# Patient Record
Sex: Male | Born: 1955 | Race: Black or African American | Hispanic: No | Marital: Single | Smoking: Never smoker
Health system: Southern US, Community
[De-identification: ages and names within clinical notes are randomized; demographics above are authoritative.]

## PROBLEM LIST (undated history)

## (undated) DIAGNOSIS — F329 Major depressive disorder, single episode, unspecified: Secondary | ICD-10-CM

## (undated) DIAGNOSIS — I4891 Unspecified atrial fibrillation: Secondary | ICD-10-CM

## (undated) DIAGNOSIS — G620 Drug-induced polyneuropathy: Secondary | ICD-10-CM

## (undated) DIAGNOSIS — D649 Anemia, unspecified: Secondary | ICD-10-CM

## (undated) DIAGNOSIS — Z8673 Personal history of transient ischemic attack (TIA), and cerebral infarction without residual deficits: Secondary | ICD-10-CM

## (undated) DIAGNOSIS — F32A Depression, unspecified: Secondary | ICD-10-CM

## (undated) DIAGNOSIS — I429 Cardiomyopathy, unspecified: Secondary | ICD-10-CM

## (undated) DIAGNOSIS — E7800 Pure hypercholesterolemia, unspecified: Secondary | ICD-10-CM

## (undated) DIAGNOSIS — I071 Rheumatic tricuspid insufficiency: Secondary | ICD-10-CM

## (undated) DIAGNOSIS — M1990 Unspecified osteoarthritis, unspecified site: Secondary | ICD-10-CM

## (undated) DIAGNOSIS — G4730 Sleep apnea, unspecified: Secondary | ICD-10-CM

## (undated) DIAGNOSIS — C801 Malignant (primary) neoplasm, unspecified: Secondary | ICD-10-CM

## (undated) DIAGNOSIS — I499 Cardiac arrhythmia, unspecified: Secondary | ICD-10-CM

## (undated) DIAGNOSIS — J189 Pneumonia, unspecified organism: Secondary | ICD-10-CM

## (undated) DIAGNOSIS — I509 Heart failure, unspecified: Secondary | ICD-10-CM

## (undated) DIAGNOSIS — C9000 Multiple myeloma not having achieved remission: Secondary | ICD-10-CM

## (undated) DIAGNOSIS — I10 Essential (primary) hypertension: Secondary | ICD-10-CM

## (undated) DIAGNOSIS — T451X5A Adverse effect of antineoplastic and immunosuppressive drugs, initial encounter: Secondary | ICD-10-CM

## (undated) DIAGNOSIS — E785 Hyperlipidemia, unspecified: Secondary | ICD-10-CM

## (undated) HISTORY — PX: CARDIAC SURGERY: SHX584

## (undated) HISTORY — DX: Personal history of transient ischemic attack (TIA), and cerebral infarction without residual deficits: Z86.73

## (undated) HISTORY — PX: ATRIAL ABLATION SURGERY: SHX560

## (undated) MED FILL — Dexamethasone Sodium Phosphate Inj 100 MG/10ML: INTRAMUSCULAR | Qty: 1 | Status: AC

## (undated) NOTE — *Deleted (*Deleted)
TREATMENT   Neuromuscular Re-education  Tandem Stance in Corner x 30s with each foot forward; Heel Toe Raises with no UE support 3s hold x 10 each Standing Single Leg Stance with no UE support x 30s each; Reviewed HEP and pt provided handout with instructions about how to perform safely;

---
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
## 2012-01-05 ENCOUNTER — Ambulatory Visit: Payer: Self-pay | Admitting: Internal Medicine

## 2012-01-05 LAB — COMPREHENSIVE METABOLIC PANEL
Anion Gap: 5 — ABNORMAL LOW (ref 7–16)
BUN: 18 mg/dL (ref 7–18)
Bilirubin,Total: 1.1 mg/dL — ABNORMAL HIGH (ref 0.2–1.0)
Chloride: 102 mmol/L (ref 98–107)
Co2: 33 mmol/L — ABNORMAL HIGH (ref 21–32)
Creatinine: 1.81 mg/dL — ABNORMAL HIGH (ref 0.60–1.30)
EGFR (African American): 50 — ABNORMAL LOW
EGFR (Non-African Amer.): 42 — ABNORMAL LOW
Glucose: 85 mg/dL (ref 65–99)
Potassium: 3.2 mmol/L — ABNORMAL LOW (ref 3.5–5.1)
SGOT(AST): 57 U/L — ABNORMAL HIGH (ref 15–37)
Total Protein: 9.6 g/dL — ABNORMAL HIGH (ref 6.4–8.2)

## 2012-01-05 LAB — CBC WITH DIFFERENTIAL/PLATELET
Basophil #: 0 10*3/uL (ref 0.0–0.1)
Basophil %: 0.7 %
Lymphocyte %: 16.2 %
MCHC: 32.2 g/dL (ref 32.0–36.0)
Monocyte %: 14.8 %
Neutrophil #: 2.3 10*3/uL (ref 1.4–6.5)
Neutrophil %: 64.1 %
RDW: 14 % (ref 11.5–14.5)

## 2012-01-05 LAB — MAGNESIUM: Magnesium: 1.8 mg/dL

## 2012-01-05 LAB — T4, FREE: Free Thyroxine: 1.23 ng/dL (ref 0.76–1.46)

## 2012-01-05 LAB — PROTIME-INR
INR: 1.4
Prothrombin Time: 17.8 secs — ABNORMAL HIGH (ref 11.5–14.7)

## 2012-02-07 ENCOUNTER — Ambulatory Visit: Payer: Self-pay | Admitting: Oncology

## 2012-02-13 ENCOUNTER — Ambulatory Visit: Payer: Self-pay | Admitting: Oncology

## 2012-02-13 LAB — CBC CANCER CENTER
Eosinophil #: 0.1 x10 3/mm (ref 0.0–0.7)
HGB: 10.7 g/dL — ABNORMAL LOW (ref 13.0–18.0)
Lymphocyte #: 0.7 x10 3/mm — ABNORMAL LOW (ref 1.0–3.6)
MCH: 29.1 pg (ref 26.0–34.0)
MCHC: 32.6 g/dL (ref 32.0–36.0)
Monocyte %: 15.1 %
Neutrophil #: 2.1 x10 3/mm (ref 1.4–6.5)
Platelet: 146 x10 3/mm — ABNORMAL LOW (ref 150–440)
WBC: 3.5 x10 3/mm — ABNORMAL LOW (ref 3.8–10.6)

## 2012-02-13 LAB — BASIC METABOLIC PANEL
Anion Gap: 5 — ABNORMAL LOW (ref 7–16)
BUN: 19 mg/dL — ABNORMAL HIGH (ref 7–18)
Chloride: 102 mmol/L (ref 98–107)
EGFR (African American): 57 — ABNORMAL LOW
EGFR (Non-African Amer.): 47 — ABNORMAL LOW
Glucose: 108 mg/dL — ABNORMAL HIGH (ref 65–99)
Osmolality: 278 (ref 275–301)
Potassium: 3.2 mmol/L — ABNORMAL LOW (ref 3.5–5.1)

## 2012-02-14 LAB — URINE IEP, RANDOM

## 2012-02-15 LAB — KAPPA/LAMBDA FREE LIGHT CHAINS (ARMC)

## 2012-02-15 LAB — PROT IMMUNOELECTROPHORES(ARMC)

## 2012-03-05 ENCOUNTER — Ambulatory Visit: Payer: Self-pay | Admitting: Oncology

## 2012-03-05 LAB — CBC CANCER CENTER
Bands: 2 %
Basophil #: 0 x10 3/mm (ref 0.0–0.1)
Eosinophil #: 0 x10 3/mm (ref 0.0–0.7)
Eosinophil %: 0.1 %
HCT: 35.8 % — ABNORMAL LOW (ref 40.0–52.0)
Lymphocyte #: 0.6 x10 3/mm — ABNORMAL LOW (ref 1.0–3.6)
MCH: 28.2 pg (ref 26.0–34.0)
MCHC: 32.2 g/dL (ref 32.0–36.0)
MCV: 88 fL (ref 80–100)
Monocyte #: 0.6 x10 3/mm (ref 0.2–1.0)
Monocyte %: 8.9 %
Neutrophil %: 81.1 %
Platelet: 101 x10 3/mm — ABNORMAL LOW (ref 150–440)
RDW: 14.1 % (ref 11.5–14.5)
WBC: 6.2 x10 3/mm (ref 3.8–10.6)

## 2012-03-05 LAB — BASIC METABOLIC PANEL
BUN: 26 mg/dL — ABNORMAL HIGH (ref 7–18)
Chloride: 94 mmol/L — ABNORMAL LOW (ref 98–107)
EGFR (African American): 54 — ABNORMAL LOW
EGFR (Non-African Amer.): 46 — ABNORMAL LOW
Osmolality: 289 (ref 275–301)
Potassium: 3.6 mmol/L (ref 3.5–5.1)
Sodium: 129 mmol/L — ABNORMAL LOW (ref 136–145)

## 2012-03-13 LAB — GLUCOSE, RANDOM: Glucose: 334 mg/dL — ABNORMAL HIGH (ref 65–99)

## 2012-03-20 LAB — GLUCOSE, RANDOM: Glucose: 547 mg/dL (ref 65–99)

## 2012-03-26 LAB — CBC CANCER CENTER
Basophil %: 0.5 %
Eosinophil #: 0.2 x10 3/mm (ref 0.0–0.7)
Eosinophil %: 3 %
HCT: 34.8 % — ABNORMAL LOW (ref 40.0–52.0)
Lymphocyte #: 1 x10 3/mm (ref 1.0–3.6)
MCHC: 32.7 g/dL (ref 32.0–36.0)
MCV: 86 fL (ref 80–100)
Monocyte #: 0.8 x10 3/mm (ref 0.2–1.0)
Monocyte %: 15.5 %
Neutrophil #: 3.1 x10 3/mm (ref 1.4–6.5)
Platelet: 90 x10 3/mm — ABNORMAL LOW (ref 150–440)
RBC: 4.04 10*6/uL — ABNORMAL LOW (ref 4.40–5.90)
RDW: 15.1 % — ABNORMAL HIGH (ref 11.5–14.5)
WBC: 5.1 x10 3/mm (ref 3.8–10.6)

## 2012-03-26 LAB — COMPREHENSIVE METABOLIC PANEL
Alkaline Phosphatase: 252 U/L — ABNORMAL HIGH (ref 50–136)
Bilirubin,Total: 0.7 mg/dL (ref 0.2–1.0)
Chloride: 98 mmol/L (ref 98–107)
Co2: 29 mmol/L (ref 21–32)
Creatinine: 1.52 mg/dL — ABNORMAL HIGH (ref 0.60–1.30)
EGFR (Non-African Amer.): 50 — ABNORMAL LOW
Glucose: 342 mg/dL — ABNORMAL HIGH (ref 65–99)
Potassium: 3.4 mmol/L — ABNORMAL LOW (ref 3.5–5.1)
SGPT (ALT): 71 U/L

## 2012-03-27 LAB — URINE IEP, RANDOM

## 2012-04-05 ENCOUNTER — Ambulatory Visit: Payer: Self-pay | Admitting: Oncology

## 2012-04-16 LAB — COMPREHENSIVE METABOLIC PANEL
Albumin: 3.7 g/dL (ref 3.4–5.0)
Co2: 35 mmol/L — ABNORMAL HIGH (ref 21–32)
EGFR (African American): 54 — ABNORMAL LOW
EGFR (Non-African Amer.): 46 — ABNORMAL LOW
Glucose: 184 mg/dL — ABNORMAL HIGH (ref 65–99)
Osmolality: 292 (ref 275–301)
Potassium: 3 mmol/L — ABNORMAL LOW (ref 3.5–5.1)

## 2012-04-16 LAB — CBC CANCER CENTER
Basophil #: 0 x10 3/mm (ref 0.0–0.1)
Basophil %: 0.6 %
HGB: 12.8 g/dL — ABNORMAL LOW (ref 13.0–18.0)
Lymphocyte #: 1.2 x10 3/mm (ref 1.0–3.6)
Monocyte #: 0.8 x10 3/mm (ref 0.2–1.0)
Monocyte %: 11.6 %
Neutrophil #: 4.9 x10 3/mm (ref 1.4–6.5)
Neutrophil %: 69.9 %
RDW: 15 % — ABNORMAL HIGH (ref 11.5–14.5)
WBC: 7.1 x10 3/mm (ref 3.8–10.6)

## 2012-04-18 LAB — URINE IEP, RANDOM

## 2012-05-05 ENCOUNTER — Ambulatory Visit: Payer: Self-pay | Admitting: Oncology

## 2012-05-07 LAB — BASIC METABOLIC PANEL
Anion Gap: 9 (ref 7–16)
BUN: 20 mg/dL — ABNORMAL HIGH (ref 7–18)
Chloride: 100 mmol/L (ref 98–107)
Co2: 30 mmol/L (ref 21–32)
Creatinine: 1.5 mg/dL — ABNORMAL HIGH (ref 0.60–1.30)
EGFR (African American): 59 — ABNORMAL LOW
EGFR (Non-African Amer.): 51 — ABNORMAL LOW
Glucose: 145 mg/dL — ABNORMAL HIGH (ref 65–99)
Osmolality: 283 (ref 275–301)

## 2012-05-07 LAB — CBC CANCER CENTER
Basophil #: 0 x10 3/mm (ref 0.0–0.1)
Eosinophil #: 0.1 x10 3/mm (ref 0.0–0.7)
HCT: 38.6 % — ABNORMAL LOW (ref 40.0–52.0)
Lymphocyte %: 23.6 %
MCHC: 33.5 g/dL (ref 32.0–36.0)
Monocyte #: 0.7 x10 3/mm (ref 0.2–1.0)
Monocyte %: 13.4 %
Neutrophil #: 3 x10 3/mm (ref 1.4–6.5)
Neutrophil %: 60.7 %
Platelet: 173 x10 3/mm (ref 150–440)
RDW: 15.1 % — ABNORMAL HIGH (ref 11.5–14.5)
WBC: 4.9 x10 3/mm (ref 3.8–10.6)

## 2012-05-09 LAB — PROT IMMUNOELECTROPHORES(ARMC)

## 2012-05-09 LAB — KAPPA/LAMBDA FREE LIGHT CHAINS (ARMC)

## 2012-05-12 LAB — URINE IEP, RANDOM

## 2012-05-28 LAB — CBC CANCER CENTER
Basophil #: 0 x10 3/mm (ref 0.0–0.1)
Basophil %: 0.3 %
Eosinophil #: 0.1 x10 3/mm (ref 0.0–0.7)
HCT: 38.1 % — ABNORMAL LOW (ref 40.0–52.0)
HGB: 12.6 g/dL — ABNORMAL LOW (ref 13.0–18.0)
Lymphocyte #: 0.9 x10 3/mm — ABNORMAL LOW (ref 1.0–3.6)
Lymphocyte %: 17.7 %
MCH: 28.7 pg (ref 26.0–34.0)
MCHC: 33.1 g/dL (ref 32.0–36.0)
MCV: 87 fL (ref 80–100)
Monocyte #: 0.6 x10 3/mm (ref 0.2–1.0)
Neutrophil #: 3.5 x10 3/mm (ref 1.4–6.5)
Platelet: 130 x10 3/mm — ABNORMAL LOW (ref 150–440)
RDW: 14.9 % — ABNORMAL HIGH (ref 11.5–14.5)

## 2012-05-28 LAB — BASIC METABOLIC PANEL
BUN: 19 mg/dL — ABNORMAL HIGH (ref 7–18)
Chloride: 102 mmol/L (ref 98–107)
Co2: 30 mmol/L (ref 21–32)
Creatinine: 1.51 mg/dL — ABNORMAL HIGH (ref 0.60–1.30)
EGFR (Non-African Amer.): 51 — ABNORMAL LOW
Glucose: 107 mg/dL — ABNORMAL HIGH (ref 65–99)
Osmolality: 278 (ref 275–301)
Potassium: 3.1 mmol/L — ABNORMAL LOW (ref 3.5–5.1)
Sodium: 138 mmol/L (ref 136–145)

## 2012-05-30 LAB — PROT IMMUNOELECTROPHORES(ARMC)

## 2012-06-05 ENCOUNTER — Ambulatory Visit: Payer: Self-pay | Admitting: Oncology

## 2012-06-18 LAB — CBC CANCER CENTER
Basophil #: 0 x10 3/mm (ref 0.0–0.1)
Eosinophil #: 0.1 x10 3/mm (ref 0.0–0.7)
Eosinophil %: 1.2 %
HCT: 37.7 % — ABNORMAL LOW (ref 40.0–52.0)
Lymphocyte #: 0.8 x10 3/mm — ABNORMAL LOW (ref 1.0–3.6)
Lymphocyte %: 13.3 %
MCV: 87 fL (ref 80–100)
Monocyte %: 10.8 %
Platelet: 123 x10 3/mm — ABNORMAL LOW (ref 150–440)
WBC: 6 x10 3/mm (ref 3.8–10.6)

## 2012-06-18 LAB — BASIC METABOLIC PANEL
Anion Gap: 6 — ABNORMAL LOW (ref 7–16)
BUN: 16 mg/dL (ref 7–18)
Calcium, Total: 8.5 mg/dL (ref 8.5–10.1)
Creatinine: 1.53 mg/dL — ABNORMAL HIGH (ref 0.60–1.30)
EGFR (African American): 58 — ABNORMAL LOW
EGFR (Non-African Amer.): 50 — ABNORMAL LOW
Glucose: 122 mg/dL — ABNORMAL HIGH (ref 65–99)
Sodium: 138 mmol/L (ref 136–145)

## 2012-06-19 LAB — PROT IMMUNOELECT,UR-24HR

## 2012-06-20 LAB — PROT IMMUNOELECTROPHORES(ARMC)

## 2012-07-06 ENCOUNTER — Ambulatory Visit: Payer: Self-pay | Admitting: Oncology

## 2012-07-09 LAB — CBC CANCER CENTER
Basophil #: 0 x10 3/mm (ref 0.0–0.1)
Basophil %: 0.2 %
Eosinophil #: 0.1 x10 3/mm (ref 0.0–0.7)
Eosinophil %: 1.7 %
HCT: 40.8 % (ref 40.0–52.0)
HGB: 13.7 g/dL (ref 13.0–18.0)
Lymphocyte #: 0.9 x10 3/mm — ABNORMAL LOW (ref 1.0–3.6)
MCH: 28.9 pg (ref 26.0–34.0)
MCHC: 33.6 g/dL (ref 32.0–36.0)
MCV: 86 fL (ref 80–100)
Monocyte #: 0.8 x10 3/mm (ref 0.2–1.0)
Neutrophil #: 3.5 x10 3/mm (ref 1.4–6.5)
Neutrophil %: 66.3 %
RBC: 4.74 10*6/uL (ref 4.40–5.90)
RDW: 13.7 % (ref 11.5–14.5)

## 2012-07-09 LAB — COMPREHENSIVE METABOLIC PANEL
Anion Gap: 6 — ABNORMAL LOW (ref 7–16)
BUN: 17 mg/dL (ref 7–18)
Bilirubin,Total: 0.6 mg/dL (ref 0.2–1.0)
Chloride: 101 mmol/L (ref 98–107)
Creatinine: 1.53 mg/dL — ABNORMAL HIGH (ref 0.60–1.30)
EGFR (African American): 58 — ABNORMAL LOW
EGFR (Non-African Amer.): 50 — ABNORMAL LOW
Potassium: 3.3 mmol/L — ABNORMAL LOW (ref 3.5–5.1)
SGOT(AST): 83 U/L — ABNORMAL HIGH (ref 15–37)
Total Protein: 7.5 g/dL (ref 6.4–8.2)

## 2012-07-24 ENCOUNTER — Emergency Department: Payer: Self-pay | Admitting: Emergency Medicine

## 2012-07-24 LAB — CBC
HCT: 41.1 % (ref 40.0–52.0)
HGB: 14.1 g/dL (ref 13.0–18.0)
MCHC: 34.4 g/dL (ref 32.0–36.0)
RBC: 4.86 10*6/uL (ref 4.40–5.90)
RDW: 13.9 % (ref 11.5–14.5)

## 2012-07-24 LAB — URINALYSIS, COMPLETE
Bacteria: NONE SEEN
Blood: NEGATIVE
Nitrite: NEGATIVE
Protein: NEGATIVE
Specific Gravity: 1.014 (ref 1.003–1.030)
Squamous Epithelial: <1

## 2012-07-24 LAB — COMPREHENSIVE METABOLIC PANEL
Albumin: 4 g/dL (ref 3.4–5.0)
Alkaline Phosphatase: 129 U/L (ref 50–136)
BUN: 32 mg/dL — ABNORMAL HIGH (ref 7–18)
Bilirubin,Total: 0.6 mg/dL (ref 0.2–1.0)
Chloride: 102 mmol/L (ref 98–107)
Creatinine: 1.79 mg/dL — ABNORMAL HIGH (ref 0.60–1.30)
EGFR (African American): 48 — ABNORMAL LOW
Glucose: 90 mg/dL (ref 65–99)
SGOT(AST): 32 U/L (ref 15–37)
SGPT (ALT): 37 U/L (ref 12–78)
Total Protein: 8.4 g/dL — ABNORMAL HIGH (ref 6.4–8.2)

## 2012-07-24 LAB — DIGOXIN LEVEL: Digoxin: 0.97 ng/mL

## 2012-07-24 LAB — PROTIME-INR: Prothrombin Time: 24.6 secs — ABNORMAL HIGH (ref 11.5–14.7)

## 2012-07-25 DIAGNOSIS — N4830 Priapism, unspecified: Secondary | ICD-10-CM | POA: Insufficient documentation

## 2012-07-30 LAB — CBC CANCER CENTER
Basophil #: 0 x10 3/mm (ref 0.0–0.1)
Eosinophil #: 0.1 x10 3/mm (ref 0.0–0.7)
HCT: 31.6 % — ABNORMAL LOW (ref 40.0–52.0)
Lymphocyte %: 12.2 %
MCH: 28.9 pg (ref 26.0–34.0)
MCHC: 33.5 g/dL (ref 32.0–36.0)
MCV: 86 fL (ref 80–100)
Monocyte #: 0.8 x10 3/mm (ref 0.2–1.0)
Monocyte %: 11.6 %
Neutrophil %: 74 %
Platelet: 164 x10 3/mm (ref 150–440)
RBC: 3.66 10*6/uL — ABNORMAL LOW (ref 4.40–5.90)
RDW: 13.7 % (ref 11.5–14.5)
WBC: 6.6 x10 3/mm (ref 3.8–10.6)

## 2012-07-30 LAB — BASIC METABOLIC PANEL
Anion Gap: 8 (ref 7–16)
Calcium, Total: 9.2 mg/dL (ref 8.5–10.1)
Chloride: 102 mmol/L (ref 98–107)
Co2: 32 mmol/L (ref 21–32)
Creatinine: 1.24 mg/dL (ref 0.60–1.30)
EGFR (African American): >60
Osmolality: 287 (ref 275–301)

## 2012-07-31 LAB — URINE IEP, RANDOM

## 2012-07-31 LAB — PROT IMMUNOELECTROPHORES(ARMC)

## 2012-07-31 LAB — KAPPA/LAMBDA FREE LIGHT CHAINS (ARMC)

## 2012-08-05 ENCOUNTER — Ambulatory Visit: Payer: Self-pay | Admitting: Oncology

## 2012-09-10 ENCOUNTER — Ambulatory Visit: Payer: Self-pay | Admitting: Oncology

## 2012-09-10 LAB — CBC CANCER CENTER
Basophil #: 0 x10 3/mm (ref 0.0–0.1)
Eosinophil #: 0.2 x10 3/mm (ref 0.0–0.7)
Eosinophil %: 4.3 %
HGB: 13.3 g/dL (ref 13.0–18.0)
Lymphocyte #: 0.8 x10 3/mm — ABNORMAL LOW (ref 1.0–3.6)
Lymphocyte %: 21.5 %
MCHC: 34.5 g/dL (ref 32.0–36.0)
MCV: 85 fL (ref 80–100)
Monocyte %: 10.7 %
Platelet: 212 x10 3/mm (ref 150–440)
RBC: 4.53 10*6/uL (ref 4.40–5.90)
RDW: 13.4 % (ref 11.5–14.5)
WBC: 3.9 x10 3/mm (ref 3.8–10.6)

## 2012-09-10 LAB — BASIC METABOLIC PANEL
BUN: 14 mg/dL (ref 7–18)
Co2: 31 mmol/L (ref 21–32)
EGFR (African American): 57 — ABNORMAL LOW
EGFR (Non-African Amer.): 49 — ABNORMAL LOW
Glucose: 111 mg/dL — ABNORMAL HIGH (ref 65–99)
Sodium: 139 mmol/L (ref 136–145)

## 2012-09-12 LAB — PROT IMMUNOELECTROPHORES(ARMC)

## 2012-10-05 ENCOUNTER — Ambulatory Visit: Payer: Self-pay | Admitting: Oncology

## 2012-12-17 ENCOUNTER — Ambulatory Visit: Payer: Self-pay | Admitting: Oncology

## 2012-12-17 LAB — CBC CANCER CENTER
Basophil #: 0 x10 3/mm (ref 0.0–0.1)
Basophil %: 0.9 %
HCT: 40.4 % (ref 40.0–52.0)
Lymphocyte #: 0.9 x10 3/mm — ABNORMAL LOW (ref 1.0–3.6)
Lymphocyte %: 24.7 %
MCHC: 33.4 g/dL (ref 32.0–36.0)
Monocyte #: 0.5 x10 3/mm (ref 0.2–1.0)
Neutrophil %: 58 %
RBC: 4.96 10*6/uL (ref 4.40–5.90)
WBC: 3.6 x10 3/mm — ABNORMAL LOW (ref 3.8–10.6)

## 2012-12-17 LAB — BASIC METABOLIC PANEL
Anion Gap: 10 (ref 7–16)
BUN: 14 mg/dL (ref 7–18)
Calcium, Total: 8.8 mg/dL (ref 8.5–10.1)
Chloride: 102 mmol/L (ref 98–107)
Creatinine: 1.69 mg/dL — ABNORMAL HIGH (ref 0.60–1.30)
Sodium: 141 mmol/L (ref 136–145)

## 2012-12-19 LAB — PROT IMMUNOELECTROPHORES(ARMC)

## 2013-01-03 ENCOUNTER — Ambulatory Visit: Payer: Self-pay | Admitting: Oncology

## 2013-05-13 ENCOUNTER — Ambulatory Visit: Payer: Self-pay | Admitting: Oncology

## 2013-05-13 LAB — CBC CANCER CENTER
Basophil %: 1.1 %
Eosinophil #: 0.1 x10 3/mm (ref 0.0–0.7)
Eosinophil %: 3.1 %
Lymphocyte #: 0.9 x10 3/mm — ABNORMAL LOW (ref 1.0–3.6)
Lymphocyte %: 24.7 %
MCH: 28 pg (ref 26.0–34.0)
MCV: 84 fL (ref 80–100)
Monocyte #: 0.4 x10 3/mm (ref 0.2–1.0)
Monocyte %: 10.3 %
Neutrophil %: 60.8 %
RBC: 5.13 10*6/uL (ref 4.40–5.90)

## 2013-05-13 LAB — BASIC METABOLIC PANEL
Anion Gap: 9 (ref 7–16)
Calcium, Total: 8.6 mg/dL (ref 8.5–10.1)
Chloride: 102 mmol/L (ref 98–107)
EGFR (African American): 58 — ABNORMAL LOW
EGFR (Non-African Amer.): 50 — ABNORMAL LOW
Glucose: 122 mg/dL — ABNORMAL HIGH (ref 65–99)
Sodium: 143 mmol/L (ref 136–145)

## 2013-05-14 LAB — PROT IMMUNOELECTROPHORES(ARMC)

## 2013-05-18 LAB — URINE IEP, RANDOM

## 2013-06-05 ENCOUNTER — Ambulatory Visit: Payer: Self-pay | Admitting: Oncology

## 2014-04-26 DIAGNOSIS — I10 Essential (primary) hypertension: Secondary | ICD-10-CM | POA: Insufficient documentation

## 2014-04-29 DIAGNOSIS — Z7901 Long term (current) use of anticoagulants: Secondary | ICD-10-CM | POA: Insufficient documentation

## 2014-04-29 DIAGNOSIS — E785 Hyperlipidemia, unspecified: Secondary | ICD-10-CM | POA: Insufficient documentation

## 2015-02-09 DIAGNOSIS — E7800 Pure hypercholesterolemia, unspecified: Secondary | ICD-10-CM | POA: Insufficient documentation

## 2015-02-09 DIAGNOSIS — E119 Type 2 diabetes mellitus without complications: Secondary | ICD-10-CM | POA: Insufficient documentation

## 2015-12-06 DIAGNOSIS — I482 Chronic atrial fibrillation, unspecified: Secondary | ICD-10-CM | POA: Insufficient documentation

## 2017-01-01 DIAGNOSIS — I071 Rheumatic tricuspid insufficiency: Secondary | ICD-10-CM | POA: Insufficient documentation

## 2017-01-01 DIAGNOSIS — I517 Cardiomegaly: Secondary | ICD-10-CM | POA: Insufficient documentation

## 2017-01-01 DIAGNOSIS — G4733 Obstructive sleep apnea (adult) (pediatric): Secondary | ICD-10-CM | POA: Insufficient documentation

## 2018-01-07 DIAGNOSIS — I428 Other cardiomyopathies: Secondary | ICD-10-CM | POA: Insufficient documentation

## 2018-01-07 DIAGNOSIS — I429 Cardiomyopathy, unspecified: Secondary | ICD-10-CM | POA: Insufficient documentation

## 2018-01-27 ENCOUNTER — Encounter: Payer: Self-pay | Admitting: *Deleted

## 2018-01-27 ENCOUNTER — Other Ambulatory Visit: Payer: Self-pay

## 2018-01-27 ENCOUNTER — Ambulatory Visit (INDEPENDENT_AMBULATORY_CARE_PROVIDER_SITE_OTHER): Payer: BLUE CROSS/BLUE SHIELD

## 2018-01-27 ENCOUNTER — Ambulatory Visit
Admission: EM | Admit: 2018-01-27 | Discharge: 2018-01-27 | Disposition: A | Discharge status: Home / Self Care | Payer: BLUE CROSS/BLUE SHIELD | Attending: Family Medicine | Admitting: Family Medicine

## 2018-01-27 DIAGNOSIS — M25572 Pain in left ankle and joints of left foot: Secondary | ICD-10-CM | POA: Diagnosis not present

## 2018-01-27 DIAGNOSIS — M25472 Effusion, left ankle: Secondary | ICD-10-CM

## 2018-01-27 HISTORY — DX: Unspecified atrial fibrillation: I48.91

## 2018-01-27 HISTORY — DX: Essential (primary) hypertension: I10

## 2018-01-27 HISTORY — DX: Malignant (primary) neoplasm, unspecified: C80.1

## 2018-01-27 MED ORDER — TRAMADOL HCL 50 MG PO TABS: 50.0000 mg | ORAL_TABLET | Freq: Three times a day (TID) | ORAL | 0 refills | Status: DC | PRN

## 2018-01-27 NOTE — Discharge Instructions (Signed)
Rest, ice, elevation.  Be sure to take your lasix.  Pain medication as needed.  Take care  Dr. Lacinda Axon

## 2018-01-27 NOTE — ED Provider Notes (Signed)
[facility]    CSN: [id number] Arrival date & time: 01/27/18  1307  History   Chief Complaint Chief Complaint  Patient presents with  . Ankle Pain   HPI   62 year old male presents with left ankle pain.  Patient states that he played tennis on Tuesday.  He has not played in a while.  He does not recall an injury.  He states that on Wednesday he was fine.  He had no pain.  On Thursday he had some pain but it was quite minimal.  On Friday he developed worsening pain and associated swelling around his left ankle.  He states that this continues to be present.  He has difficulty ambulating.  Worse with range of motion.  He reports decreased range of motion.  No relieving factors.  No other associated symptoms.  No other complaints at this time.  Past Medical History:  Diagnosis Date  . A-fib ([place])   . Cancer ([place])   . Hypertension    Surgical Hx - Ablation.  Home Medications    Prior to Admission medications   Medication Sig Start Date End Date Taking? Authorizing Provider  albuterol (PROVENTIL HFA) 108 (90 Base) MCG/ACT inhaler INHALE 2 INHALATIONS INTO THE LUNGS EVERY 6 HOURS AS NEEDED 12/31/17  Yes [provider]  atorvastatin (LIPITOR) 10 MG tablet Take by mouth. 05/23/17 05/23/18 Yes [provider]  digoxin ([place]) 0.25 MG tablet TAKE 1 TABLET BY MOUTH EVERY DAY 05/06/17  Yes [provider]  furosemide (LASIX) 80 MG tablet TAKE 1 TABLET(80 MG) BY MOUTH TWICE DAILY 10/11/17  Yes [provider]  pioglitazone (ACTOS) 15 MG tablet TAKE 1 TABLET(15 MG) BY MOUTH EVERY DAY 05/10/17  Yes [provider]  potassium bicarbonate (KLOR-CON/EF) 25 MEQ disintegrating tablet DISSOLVE 1 TABLET IN LIQUID AND DRINK BY MOUTH ONCE DAILY 11/29/16  Yes [provider]  warfarin (COUMADIN) 5 MG tablet Take by mouth. 11/22/17  Yes [provider]  traMADol (ULTRAM) 50 MG tablet Take 1 tablet (50 mg total) by mouth every 8 (eight) hours  as needed. 01/27/18   Coral Spikes, DO    Family History Diabetes     High blood pressure (Hypertension)     Parkinsonism     Prostate cancer      Social History Social History   Tobacco Use  . Smoking status: Never Smoker  . Smokeless tobacco: Never Used  Substance Use Topics  . Alcohol use: Not Currently  . Drug use: Never   Allergies   Patient has no known allergies.   Review of Systems Review of Systems  Constitutional: Negative.   Musculoskeletal:       Left ankle pain and swelling.   Physical Exam Triage Vital Signs ED Triage Vitals  Enc Vitals Group     BP 01/27/18 1331 (!) 130/52     Pulse Rate 01/27/18 1331 73     Resp 01/27/18 1331 16     Temp 01/27/18 1331 98.6 F (37 C)     Temp Source 01/27/18 1331 Oral     SpO2 01/27/18 1331 98 %     Weight 01/27/18 1333 235 lb (106.6 kg)     Height 01/27/18 1333 5\' 9"  (1.753 m)     Head Circumference --      Peak Flow --      Pain Score 01/27/18 1333 10     Pain Loc --      Pain Edu? --  Excl. in [place]? --    Updated Vital Signs BP (!) 130/52 (BP Location: Left Arm)   Pulse 73   Temp 98.6 F (37 C) (Oral)   Resp 16   Ht 5\' 9"  (1.753 m)   Wt 235 lb (106.6 kg)   SpO2 98%   BMI 34.70 kg/m   Physical Exam  Constitutional: He is oriented to person, place, and time. He appears well-developed. No distress.  Cardiovascular: Normal rate and regular rhythm.  Pulmonary/Chest: Effort normal. No respiratory distress.  Musculoskeletal:  Left ankle - diffuse edema.  Tenderness located at the lateral malleolus.  He is exquisitely tender to palpation decreased range of motion secondary pain.  No tenderness in the foot.  Neurological: He is alert and oriented to person, place, and time.  Psychiatric: He has a normal mood and affect. His behavior is normal.  Nursing note and vitals reviewed.  UC Treatments / Results  Labs (all labs ordered are listed, but only abnormal results are displayed) Labs  Reviewed - No data to display  EKG None Radiology Dg Ankle Complete Left  Result Date: 01/27/2018 CLINICAL DATA:  Pain and swelling EXAM: LEFT ANKLE COMPLETE - 3+ VIEW COMPARISON:  None. FINDINGS: Frontal, oblique, and lateral views were obtained. There is diffuse soft tissue swelling. There is a small joint effusion. No fracture evident. No appreciable joint space narrowing or erosion. The ankle mortise appears intact. IMPRESSION: Diffuse soft tissue swelling. There is a joint effusion. Question underlying ligamentous injury given these findings. No fracture or appreciable arthropathy. The ankle mortise appears intact. Electronically Signed   By: Lowella Grip III M.D.   On: 01/27/2018 14:36    Procedures Procedures (including critical care time)  Medications Ordered in UC Medications - No data to display   Initial Impression / Assessment and Plan / UC Course  I have reviewed the triage vital signs and the nursing notes.  Pertinent labs & imaging results that were available during my care of the patient were reviewed by me and considered in my medical decision making (see chart for details).    62 year old male presents with left ankle pain and swelling.  I suspect that he sprained his ankle although he does not recall an injury.  X-ray revealed no fracture.  It did reveal an effusion.  Likely ligamentous injury per x-ray.  Patient brought his own boot today.  I advised him to use it.  Advised rest, ice, compression, elevation.  Tramadol for pain.  Final Clinical Impressions(s) / UC Diagnoses   Final diagnoses:  Acute left ankle pain    ED Discharge Orders        Ordered    traMADol (ULTRAM) 50 MG tablet  Every 8 hours PRN     01/27/18 1442     Controlled Substance Prescriptions [place] Controlled Substance Registry consulted? Not Applicable   Coral Spikes, DO 01/27/18 1454

## 2018-01-27 NOTE — ED Triage Notes (Signed)
Patient started having left ankle pain 4 days ago. Patient has a history of left ankle sprain and arthritis. Patient reported he played tennis 6 days ago.

## 2018-02-05 DIAGNOSIS — M19072 Primary osteoarthritis, left ankle and foot: Secondary | ICD-10-CM | POA: Insufficient documentation

## 2019-05-12 ENCOUNTER — Encounter: Payer: Self-pay | Admitting: Emergency Medicine

## 2019-05-12 ENCOUNTER — Observation Stay
Admission: EM | Admit: 2019-05-12 | Discharge: 2019-05-13 | Disposition: A | Discharge status: Home / Self Care | Payer: BC Managed Care – PPO | Attending: Internal Medicine | Admitting: Internal Medicine

## 2019-05-12 ENCOUNTER — Other Ambulatory Visit: Payer: Self-pay

## 2019-05-12 DIAGNOSIS — N183 Chronic kidney disease, stage 3 (moderate): Secondary | ICD-10-CM | POA: Insufficient documentation

## 2019-05-12 DIAGNOSIS — Z7982 Long term (current) use of aspirin: Secondary | ICD-10-CM | POA: Diagnosis not present

## 2019-05-12 DIAGNOSIS — I129 Hypertensive chronic kidney disease with stage 1 through stage 4 chronic kidney disease, or unspecified chronic kidney disease: Secondary | ICD-10-CM | POA: Insufficient documentation

## 2019-05-12 DIAGNOSIS — N179 Acute kidney failure, unspecified: Secondary | ICD-10-CM

## 2019-05-12 DIAGNOSIS — Z1159 Encounter for screening for other viral diseases: Secondary | ICD-10-CM | POA: Insufficient documentation

## 2019-05-12 DIAGNOSIS — D649 Anemia, unspecified: Secondary | ICD-10-CM | POA: Diagnosis present

## 2019-05-12 DIAGNOSIS — R531 Weakness: Secondary | ICD-10-CM | POA: Diagnosis not present

## 2019-05-12 DIAGNOSIS — Z79899 Other long term (current) drug therapy: Secondary | ICD-10-CM | POA: Diagnosis not present

## 2019-05-12 DIAGNOSIS — I482 Chronic atrial fibrillation, unspecified: Secondary | ICD-10-CM | POA: Diagnosis not present

## 2019-05-12 DIAGNOSIS — E876 Hypokalemia: Secondary | ICD-10-CM | POA: Insufficient documentation

## 2019-05-12 DIAGNOSIS — I10 Essential (primary) hypertension: Secondary | ICD-10-CM | POA: Insufficient documentation

## 2019-05-12 DIAGNOSIS — D638 Anemia in other chronic diseases classified elsewhere: Secondary | ICD-10-CM | POA: Diagnosis not present

## 2019-05-12 DIAGNOSIS — Z8579 Personal history of other malignant neoplasms of lymphoid, hematopoietic and related tissues: Secondary | ICD-10-CM | POA: Diagnosis not present

## 2019-05-12 DIAGNOSIS — C9000 Multiple myeloma not having achieved remission: Secondary | ICD-10-CM | POA: Insufficient documentation

## 2019-05-12 LAB — BASIC METABOLIC PANEL
Anion gap: 8 (ref 5–15)
BUN: 21 mg/dL (ref 8–23)
CO2: 27 mmol/L (ref 22–32)
Calcium: 8.3 mg/dL — ABNORMAL LOW (ref 8.9–10.3)
Chloride: 101 mmol/L (ref 98–111)
Creatinine, Ser: 1.8 mg/dL — ABNORMAL HIGH (ref 0.61–1.24)
GFR calc Af Amer: 45 mL/min — ABNORMAL LOW (ref >60)
GFR calc non Af Amer: 39 mL/min — ABNORMAL LOW (ref >60)
Glucose, Bld: 99 mg/dL (ref 70–99)
Potassium: 3.4 mmol/L — ABNORMAL LOW (ref 3.5–5.1)
Sodium: 136 mmol/L (ref 135–145)

## 2019-05-12 LAB — CBC WITH DIFFERENTIAL/PLATELET
Abs Immature Granulocytes: 0.01 10*3/uL (ref 0.00–0.07)
Basophils Absolute: 0 10*3/uL (ref 0.0–0.1)
Basophils Relative: 1 %
Eosinophils Absolute: 0.1 10*3/uL (ref 0.0–0.5)
Eosinophils Relative: 3 %
HCT: 20.2 % — ABNORMAL LOW (ref 39.0–52.0)
Hemoglobin: 5.6 g/dL — ABNORMAL LOW (ref 13.0–17.0)
Immature Granulocytes: 0 %
Lymphocytes Relative: 20 %
Lymphs Abs: 0.9 10*3/uL (ref 0.7–4.0)
MCH: 18.4 pg — ABNORMAL LOW (ref 26.0–34.0)
MCHC: 27.7 g/dL — ABNORMAL LOW (ref 30.0–36.0)
MCV: 66.4 fL — ABNORMAL LOW (ref 80.0–100.0)
Monocytes Absolute: 0.5 10*3/uL (ref 0.1–1.0)
Monocytes Relative: 12 %
Neutro Abs: 3 10*3/uL (ref 1.7–7.7)
Neutrophils Relative %: 64 %
Platelets: 207 10*3/uL (ref 150–400)
RBC: 3.04 MIL/uL — ABNORMAL LOW (ref 4.22–5.81)
RDW: 20.1 % — ABNORMAL HIGH (ref 11.5–15.5)
Smear Review: NORMAL
WBC: 4.6 10*3/uL (ref 4.0–10.5)
nRBC: 0 % (ref 0.0–0.2)

## 2019-05-12 LAB — PREPARE RBC (CROSSMATCH)

## 2019-05-12 LAB — ABO/RH: ABO/RH(D): O POS

## 2019-05-12 MED ORDER — SODIUM CHLORIDE 0.9% FLUSH: 3.0000 mL | INTRAVENOUS | Status: DC | PRN

## 2019-05-12 MED ORDER — POTASSIUM CHLORIDE 20 MEQ/15ML (10%) PO SOLN
30.0000 meq | Freq: Every day | ORAL | Status: DC
  Administered 2019-05-12 – 2019-05-13 (×2): 30 meq via ORAL
  Filled 2019-05-12 (×3): qty 30

## 2019-05-12 MED ORDER — ACETAMINOPHEN 325 MG PO TABS: 650.0000 mg | ORAL_TABLET | Freq: Four times a day (QID) | ORAL | Status: DC | PRN

## 2019-05-12 MED ORDER — DIGOXIN 250 MCG PO TABS
0.2500 mg | ORAL_TABLET | Freq: Every day | ORAL | Status: DC
  Administered 2019-05-13: 0.25 mg via ORAL
  Filled 2019-05-12: qty 1

## 2019-05-12 MED ORDER — POTASSIUM CHLORIDE CRYS ER 20 MEQ PO TBCR
30.0000 meq | EXTENDED_RELEASE_TABLET | Freq: Every day | ORAL | Status: DC
  Filled 2019-05-12: qty 1

## 2019-05-12 MED ORDER — SODIUM CHLORIDE 0.9 % IV SOLN
10.0000 mL/h | Freq: Once | INTRAVENOUS | Status: AC
  Administered 2019-05-12: 10 mL/h via INTRAVENOUS

## 2019-05-12 MED ORDER — HYDROCODONE-ACETAMINOPHEN 5-325 MG PO TABS: 1.0000 | ORAL_TABLET | ORAL | Status: DC | PRN

## 2019-05-12 MED ORDER — SENNOSIDES-DOCUSATE SODIUM 8.6-50 MG PO TABS: 1.0000 | ORAL_TABLET | Freq: Every evening | ORAL | Status: DC | PRN

## 2019-05-12 MED ORDER — SODIUM CHLORIDE 0.9 % IV SOLN: 250.0000 mL | INTRAVENOUS | Status: DC | PRN

## 2019-05-12 MED ORDER — ONDANSETRON HCL 4 MG PO TABS: 4.0000 mg | ORAL_TABLET | Freq: Four times a day (QID) | ORAL | Status: DC | PRN

## 2019-05-12 MED ORDER — ASPIRIN EC 81 MG PO TBEC
81.0000 mg | DELAYED_RELEASE_TABLET | Freq: Every day | ORAL | Status: DC
  Administered 2019-05-13: 81 mg via ORAL
  Filled 2019-05-12: qty 1

## 2019-05-12 MED ORDER — FUROSEMIDE 40 MG PO TABS
80.0000 mg | ORAL_TABLET | Freq: Two times a day (BID) | ORAL | Status: DC
  Administered 2019-05-12 – 2019-05-13 (×2): 80 mg via ORAL
  Filled 2019-05-12 (×2): qty 2

## 2019-05-12 MED ORDER — SODIUM CHLORIDE 0.9% FLUSH
3.0000 mL | Freq: Two times a day (BID) | INTRAVENOUS | Status: DC
  Administered 2019-05-12: 3 mL via INTRAVENOUS

## 2019-05-12 MED ORDER — HEPARIN SODIUM (PORCINE) 5000 UNIT/ML IJ SOLN: 5000.0000 [IU] | Freq: Three times a day (TID) | INTRAMUSCULAR | Status: DC

## 2019-05-12 MED ORDER — ACETAMINOPHEN 650 MG RE SUPP: 650.0000 mg | Freq: Four times a day (QID) | RECTAL | Status: DC | PRN

## 2019-05-12 MED ORDER — ONDANSETRON HCL 4 MG/2ML IJ SOLN: 4.0000 mg | Freq: Four times a day (QID) | INTRAMUSCULAR | Status: DC | PRN

## 2019-05-12 MED ORDER — ALBUTEROL SULFATE (2.5 MG/3ML) 0.083% IN NEBU: 2.5000 mg | INHALATION_SOLUTION | RESPIRATORY_TRACT | Status: DC | PRN

## 2019-05-12 NOTE — ED Provider Notes (Signed)
[facility] Provider Note   CSN: [id number] Arrival date & time: 05/12/19  1146     History   Chief Complaint Chief Complaint  Patient presents with  . Anemia    HPI Chris George is a 63 y.o. male with atrial fibrillation not on anticoagulation and myeloma who presents for anemia.  Patient's had some increasing fatigue therefore was seen by his primary care doctor to try to get reestablished with his hematologist given his concern that his myeloma may be coming back.  He was diagnosed with myeloma in 2013 has not had follow-up recently.  His fatigue has been ongoing for the past couple of weeks, constant, nothing makes it better, nothing makes it worse.  He had a hemoglobin check in the 5 range at his primary care doctor so was sent here for further evaluation.   Past Medical History:  Diagnosis Date  . A-fib ([place])   . Cancer ([place])   . Hypertension     There are no active problems to display for this patient.   Past Surgical History:  Procedure Laterality Date  . CARDIAC SURGERY          Home Medications    Prior to Admission medications   Medication Sig Start Date End Date Taking? Authorizing Provider  albuterol (PROVENTIL HFA) 108 (90 Base) MCG/ACT inhaler INHALE 2 INHALATIONS INTO THE LUNGS EVERY 6 HOURS AS NEEDED 12/31/17   [provider]  atorvastatin (LIPITOR) 10 MG tablet Take by mouth. 05/23/17 05/23/18  [provider]  digoxin ([place]) 0.25 MG tablet TAKE 1 TABLET BY MOUTH EVERY DAY 05/06/17   [provider]  furosemide (LASIX) 80 MG tablet TAKE 1 TABLET(80 MG) BY MOUTH TWICE DAILY 10/11/17   [provider]  pioglitazone (ACTOS) 15 MG tablet TAKE 1 TABLET(15 MG) BY MOUTH EVERY DAY 05/10/17   [provider]  potassium bicarbonate (KLOR-CON/EF) 25 MEQ disintegrating tablet DISSOLVE 1 TABLET IN LIQUID AND DRINK BY MOUTH ONCE DAILY 11/29/16   [provider]  traMADol  (ULTRAM) 50 MG tablet Take 1 tablet (50 mg total) by mouth every 8 (eight) hours as needed. 01/27/18   Coral Spikes, DO  warfarin (COUMADIN) 5 MG tablet Take by mouth. 11/22/17   [provider]    Family History Family History  Problem Relation Age of Onset  . Healthy Mother     Social History Social History   Tobacco Use  . Smoking status: Never Smoker  . Smokeless tobacco: Never Used  Substance Use Topics  . Alcohol use: Not Currently  . Drug use: Never     Allergies   Patient has no known allergies.   Review of Systems Review of Systems  Constitutional: Positive for fatigue.  Hematological:       Denies rectal bleeding.  All other systems reviewed and are negative.    Physical Exam Updated Vital Signs BP (!) 105/50 (BP Location: Left Arm)   Pulse 62   Temp 99 F (37.2 C) (Oral)   Resp 16   Ht _0  (1.753 m)   Wt 97.5 kg   SpO2 99%   BMI 31.75 kg/m   Physical Exam Constitutional:      Appearance: Normal appearance.  HENT:     Head: Normocephalic and atraumatic.     Nose: No congestion or rhinorrhea.     Mouth/Throat:     Mouth: Mucous membranes are moist.     Pharynx: Oropharynx is clear.  Eyes:     General: No scleral icterus.    Conjunctiva/sclera: Conjunctivae normal.  Neck:     Musculoskeletal: Normal range of motion and neck supple.  Cardiovascular:     Rate and Rhythm: Normal rate.     Pulses: Normal pulses.  Pulmonary:     Effort: Pulmonary effort is normal.     Breath sounds: Normal breath sounds.  Abdominal:     General: Abdomen is flat. There is no distension.     Tenderness: There is no abdominal tenderness.  Genitourinary:    Rectum: Normal. Guaiac result negative.  Musculoskeletal:        General: No swelling or tenderness.  Skin:    Coloration: Skin is pale. Skin is not jaundiced.     Findings: No bruising.  Neurological:     General: No focal deficit present.     Mental Status: He is alert and oriented to  person, place, and time. Mental status is at baseline.      ED Treatments / Results  Labs (all labs ordered are listed, but only abnormal results are displayed) Labs Reviewed  CBC WITH DIFFERENTIAL/PLATELET - Abnormal; Notable for the following components:      Result Value   RBC 3.04 (*)    Hemoglobin 5.6 (*)    HCT 20.2 (*)    MCV 66.4 (*)    MCH 18.4 (*)    MCHC 27.7 (*)    RDW 20.1 (*)    All other components within normal limits  BASIC METABOLIC PANEL - Abnormal; Notable for the following components:   Potassium 3.4 (*)    Creatinine, Ser 1.80 (*)    Calcium 8.3 (*)    GFR calc non Af Amer 39 (*)    GFR calc Af Amer 45 (*)    All other components within normal limits  TYPE AND SCREEN       Medications Ordered in ED Medications  0.9 %  sodium chloride infusion (has no administration in time range)  aspirin EC tablet 81 mg (has no administration in time range)  digoxin (LANOXIN) tablet 0.25 mg (has no administration in time range)  furosemide (LASIX) tablet 80 mg (has no administration in time range)  potassium chloride (K-DUR) CR tablet 30 mEq (has no administration in time range)  heparin injection 5,000 Units (has no administration in time range)  acetaminophen (TYLENOL) tablet 650 mg (has no administration in time range)    Or  acetaminophen (TYLENOL) suppository 650 mg (has no administration in time range)  ondansetron (ZOFRAN) tablet 4 mg (has no administration in time range)    Or  ondansetron (ZOFRAN) injection 4 mg (has no administration in time range)  albuterol (PROVENTIL) (2.5 MG/3ML) 0.083% nebulizer solution 2.5 mg (has no administration in time range)  sodium chloride flush (NS) 0.9 % injection 3 mL (has no administration in time range)  sodium chloride flush (NS) 0.9 % injection 3 mL (has no administration in time range)  0.9 %  sodium chloride infusion (has no administration in time range)  HYDROcodone-acetaminophen (NORCO/VICODIN) 5-325 MG per  tablet 1 tablet (has no administration in time range)  senna-docusate (Senokot-S) tablet 1 tablet (has no administration in time range)     Initial Impression / Assessment and Plan / ED Course  I have reviewed the triage vital signs and the nursing notes.  Pertinent labs & imaging results that were available during my care of the patient were reviewed by me and considered in my medical  decision making (see chart for details).  Patient's anemia is most concerning for worsening of his multiple myeloma.  Hemoccult test negative therefore unlikely GI bleed patient and pt is not on a blood thinner.  Patient will need further work-up inpatient to evaluate for other deficiencies that could cause anemia such as B12, anemia, folate although seem less likely.  Patient denies any infectious symptoms.  Clinical Course as of May 11 1337  Tue May 12, 2019  1338 [place] noted to be 5.6.   [MF]    Clinical Course User Index [MF] Vanessa Panama, MD    Hemoccult test was negative.  Paged Dr. Grayland Ormond from onc and he will visit him while in the hospital given he saw patient a few years ago for MM.  Will consult medicine for admission.   Patient consented for blood.  Will transfuse 1 unit.  Patient admitted to medicine for anemia.  Final Clinical Impressions(s) / ED Diagnoses   Final diagnoses:  Symptomatic anemia    ED Discharge Orders    None       Vanessa [place], MD 05/12/19 [phone]

## 2019-05-12 NOTE — ED Triage Notes (Signed)
Patient states he had blood work drawn this morning and was told to come to ED due to low Hgb.  Patient states he has multiple myeloma and last blood work was done one year ago.

## 2019-05-12 NOTE — H&P (Addendum)
[facility] [place] NAME: Chris George    MR#:  [id number]  DATE OF BIRTH:  1956-03-19  DATE OF ADMISSION:  05/12/2019  PRIMARY CARE PHYSICIAN: Sofie Hartigan, MD   REQUESTING/REFERRING PHYSICIAN: Dr. Jari Pigg  CHIEF COMPLAINT:   Chief Complaint  Patient presents with  . Anemia   Anemia. HISTORY OF PRESENT ILLNESS:  Chris George  is a 63 y.o. male with a known history of A. fib, multiple myeloma and hypertension.  The patient has had fatigue and generalized weakness for about 2 weeks.  He denies any melena or bloody stool.  Denies any other symptoms.  He is found hemoglobin 5.6.  ED physician request admission. PAST MEDICAL HISTORY:   Past Medical History:  Diagnosis Date  . A-fib ([place])   . Cancer ([place])   . Hypertension     PAST SURGICAL HISTORY:   Past Surgical History:  Procedure Laterality Date  . CARDIAC SURGERY      SOCIAL HISTORY:   Social History   Tobacco Use  . Smoking status: Never Smoker  . Smokeless tobacco: Never Used  Substance Use Topics  . Alcohol use: Not Currently    FAMILY HISTORY:   Family History  Problem Relation Age of Onset  . Healthy Mother     DRUG ALLERGIES:  No Known Allergies  REVIEW OF SYSTEMS:   Review of Systems  Constitutional: Positive for malaise/fatigue. Negative for chills and fever.  HENT: Negative for sore throat.   Eyes: Negative for blurred vision and double vision.  Respiratory: Negative for cough, hemoptysis, shortness of breath, wheezing and stridor.   Cardiovascular: Negative for chest pain, palpitations, orthopnea and leg swelling.  Gastrointestinal: Negative for abdominal pain, blood in stool, diarrhea, melena, nausea and vomiting.  Genitourinary: Negative for dysuria, flank pain and hematuria.  Musculoskeletal: Negative for back pain and joint pain.  Neurological: Negative for dizziness, sensory change, focal weakness, seizures, loss of consciousness,  weakness and headaches.  Endo/Heme/Allergies: Negative for polydipsia.  Psychiatric/Behavioral: Negative for depression. The patient is not nervous/anxious.     MEDICATIONS AT HOME:   Prior to Admission medications   Medication Sig Start Date End Date Taking? Authorizing Provider  aspirin EC 81 MG tablet Take 81 mg by mouth daily.   Yes [provider]  digoxin (DIGOX) 0.25 MG tablet Take 0.25 mg by mouth daily.    Yes [provider]  furosemide (LASIX) 80 MG tablet Take 80 mg by mouth 2 (two) times daily.    Yes [provider]  potassium bicarbonate (KLOR-CON/EF) 25 MEQ disintegrating tablet Take 25 mEq by mouth daily.    Yes [provider]      VITAL SIGNS:  Blood pressure (!) 105/50, pulse 62, temperature 99 F (37.2 C), temperature source Oral, resp. rate 16, height '5\' 9"'$  (1.753 m), weight 97.5 kg, SpO2 99 %.  PHYSICAL EXAMINATION:  Physical Exam  GENERAL:  63 y.o.-year-old patient lying in the bed with no acute distress.  EYES: Pupils equal, round, reactive to light and accommodation. No scleral icterus. Extraocular muscles intact.  HEENT: Head atraumatic, normocephalic. Oropharynx and nasopharynx clear.  NECK:  Supple, no jugular venous distention. No thyroid enlargement, no tenderness.  LUNGS: Normal breath sounds bilaterally, no wheezing, rales,rhonchi or crepitation. No use of accessory muscles of respiration.  CARDIOVASCULAR: S1, S2 normal. No murmurs, rubs, or gallops.  ABDOMEN: Soft, nontender, nondistended. Bowel sounds present. No organomegaly or mass.  EXTREMITIES: No pedal  edema, cyanosis, or clubbing.  NEUROLOGIC: Cranial nerves II through XII are intact. Muscle strength 5/5 in all extremities. Sensation intact. Gait not checked.  PSYCHIATRIC: The patient is alert and oriented x 3.  SKIN: No obvious rash, lesion, or ulcer.   LABORATORY PANEL:   CBC Recent Labs  Lab 05/12/19 1203  WBC 4.6  HGB 5.6*  HCT 20.2*  PLT 207    ------------------------------------------------------------------------------------------------------------------  Chemistries  Recent Labs  Lab 05/12/19 1203  NA 136  K 3.4*  CL 101  CO2 27  GLUCOSE 99  BUN 21  CREATININE 1.80*  CALCIUM 8.3*   ------------------------------------------------------------------------------------------------------------------  Cardiac Enzymes No results for input(s): TROPONINI in the last 168 hours. ------------------------------------------------------------------------------------------------------------------  RADIOLOGY:  No results found.    IMPRESSION AND PLAN:   Anemia.  Possible due to multiple myeloma. The patient will be placed to observation. 1 unit PRBC transfusion.  Follow-up hemoglobin in a.m.  Oncology consult from [facility]. Hypokalemia.  Potassium supplement. CKD stage III.  Looks like stable.  But worsening then 6 years ago. Chronic A. fib.  Rate controlled.  Not on anticoagulation.  Continue digoxin. Hypertension.  Controlled.  Continue Lasix. All the records are reviewed and case discussed with ED provider. Management plans discussed with the patient, family and they are in agreement.  CODE STATUS: Full code.  TOTAL TIME TAKING CARE OF THIS PATIENT: 40  minutes.    Demetrios Loll M.D on 05/12/2019 at 2:51 PM  Between 7am to 6pm - Pager - [phone]  After 6pm go to [email or web address] - Proofreader  [facility]  Office  [phone]  CC: Primary care physician; Sofie Hartigan, MD   Note: This dictation was prepared with Dragon dictation along with smaller phrase technology. Any transcriptional errors that result from this process are unin

## 2019-05-12 NOTE — ED Notes (Signed)
Walked pt to car to get belongings

## 2019-05-12 NOTE — ED Notes (Signed)
ED TO INPATIENT HANDOFF REPORT  ED Nurse Name and Phone #: Dessa Ledee [phone]  S Name/Age/Gender Chris George 63 y.o. male Room/Bed: ED09A/ED09A  Code Status   Code Status: Not on file  Home/SNF/Other Home Patient oriented to: self, place, time and situation Is this baseline? Yes   Triage Complete: Triage complete  Chief Complaint abnormal labs  Triage Note Patient states he had blood work drawn this morning and was told to come to ED due to low Hgb.  Patient states he has multiple myeloma and last blood work was done one year ago.   Allergies No Known Allergies  Level of Care/Admitting Diagnosis ED Disposition    ED Disposition Condition [facility] Area: [place] [100120]  Level of Care: Med-Surg [16]  Covid Evaluation: Asymptomatic Screening Protocol (No Symptoms)  Diagnosis: Anemia [706237]  Admitting Physician: Demetrios Loll [[id number]]  Attending Physician: Demetrios Loll [phone]  PT Class (Do Not Modify): Observation [104]  PT Acc Code (Do Not Modify): Observation [10022]       B Medical/Surgery History Past Medical History:  Diagnosis Date  . A-fib ([place])   . Cancer ([place])   . Hypertension    Past Surgical History:  Procedure Laterality Date  . CARDIAC SURGERY       A IV Location/Drains/Wounds Patient Lines/Drains/Airways Status   Active Line/Drains/Airways    Name:   Placement date:   Placement time:   Site:   Days:   Peripheral IV 05/12/19 Left Antecubital   05/12/19    1449    Antecubital   less than 1          Intake/Output Last 24 hours No intake or output data in the 24 hours ending 05/12/19 1504  Labs/Imaging Results for orders placed or performed during the hospital encounter of 05/12/19 (from the past 48 hour(s))  CBC with Differential     Status: Abnormal   Collection Time: 05/12/19 12:03 PM  Result Value Ref Range   WBC 4.6 4.0 - 10.5 K/uL   RBC 3.04 (L) 4.22 - 5.81 MIL/uL   Hemoglobin 5.6 (L) 13.0 -  17.0 g/dL    Comment: Reticulocyte Hemoglobin testing may be clinically indicated, consider ordering this additional test [id number]    HCT 20.2 (L) 39.0 - 52.0 %   MCV 66.4 (L) 80.0 - 100.0 fL   MCH 18.4 (L) 26.0 - 34.0 pg   MCHC 27.7 (L) 30.0 - 36.0 g/dL   RDW 20.1 (H) 11.5 - 15.5 %   Platelets 207 150 - 400 K/uL   nRBC 0.0 0.0 - 0.2 %   Neutrophils Relative % 64 %   Neutro Abs 3.0 1.7 - 7.7 K/uL   Lymphocytes Relative 20 %   Lymphs Abs 0.9 0.7 - 4.0 K/uL   Monocytes Relative 12 %   Monocytes Absolute 0.5 0.1 - 1.0 K/uL   Eosinophils Relative 3 %   Eosinophils Absolute 0.1 0.0 - 0.5 K/uL   Basophils Relative 1 %   Basophils Absolute 0.0 0.0 - 0.1 K/uL   WBC Morphology MORPHOLOGY UNREMARKABLE    Smear Review Normal platelet morphology    Immature Granulocytes 0 %   Abs Immature Granulocytes 0.01 0.00 - 0.07 K/uL   Polychromasia PRESENT     Comment: Performed at [facility], [address], [place], [place] [address]  Basic metabolic panel     Status: Abnormal   Collection Time: 05/12/19 12:03 PM  Result Value Ref Range  Sodium 136 135 - 145 mmol/L   Potassium 3.4 (L) 3.5 - 5.1 mmol/L   Chloride 101 98 - 111 mmol/L   CO2 27 22 - 32 mmol/L   Glucose, Bld 99 70 - 99 mg/dL   BUN 21 8 - 23 mg/dL   Creatinine, Ser 1.80 (H) 0.61 - 1.24 mg/dL   Calcium 8.3 (L) 8.9 - 10.3 mg/dL   GFR calc non Af Amer 39 (L) >60 mL/min   GFR calc Af Amer 45 (L) >60 mL/min   Anion gap 8 5 - 15    Comment: Performed at [facility], [address] [place], [place] [address]  Type and screen [place]     Status: None   Collection Time: 05/12/19 12:03 PM  Result Value Ref Range   ABO/RH(D) O POS    Antibody Screen NEG    Sample Expiration      05/15/2019,2359 Performed at [facility], [address], [place], [place] [address]   Prepare RBC     Status: None   Collection Time: 05/12/19  2:04 PM  Result Value Ref Range   Order  Confirmation      ORDER PROCESSED BY BLOOD BANK Performed at [facility], [address], [place], [place] [address]    No results found.  Pending Labs Unresulted Labs (From admission, onward)    Start     Ordered   05/12/19 1420  ABO/Rh  Once,   STAT     05/12/19 1420   05/12/19 1404  Novel Coronavirus,NAA,(SEND-OUT TO REF LAB - TAT 24-48 hrs); Hosp Order  (Asymptomatic Patients Labs)  Once,   STAT    Question:  Rule Out  Answer:  Yes   05/12/19 1403   Signed and Held  Creatinine, serum  (heparin)  Once,   R    Comments: Baseline for heparin therapy IF NOT ALREADY DRAWN.    Signed and Held   Signed and Held  HIV antibody (Routine Testing)  Once,   R     Signed and Held   Signed and Occupational hygienist morning,   R     Signed and Held   Signed and Held  CBC  Tomorrow morning,   R     Signed and Held   Signed and Held  Magnesium  Tomorrow morning,   R     Signed and Held          Vitals/Pain Today's Vitals   05/12/19 1155 05/12/19 1200  BP:  (!) 105/50  Pulse:  62  Resp:  16  Temp:  99 F (37.2 C)  TempSrc:  Oral  SpO2:  99%  Weight: 97.5 kg   Height: '5\' 9"'$  (1.753 m)   PainSc: 0-No pain     Isolation Precautions No active isolations  Medications Medications  0.9 %  sodium chloride infusion (has no administration in time range)    Mobility walks Low fall risk   Focused Assessments Cardiac Assessment Handoff:    No results found for: CKTOTAL, CKMB, CKMBINDEX, TROPONINI No results found for: DDIMER Does the Patient currently have chest pain? No      R Recommendations: See Admitting Provider Note  Report given to:   Additional Notes:

## 2019-05-12 NOTE — Consult Note (Signed)
[place]  [phone] [phone]  ID: Chris George OB: 07-14-1956  MR#: [id number]  VZD#:[id number]  Patient Care Team: Sofie Hartigan, MD as PCP - General (Family Medicine)  CHIEF COMPLAINT: Severe anemia with history of multiple myeloma.  INTERVAL HISTORY: Patient is a 63 year old male who was last evaluated in July 2014.  He had recently completed treatment for multiple myeloma, but subsequently declined maintenance treatment or bone marrow transplant as recommended.  He was then lost to follow-up.  He presented to the ER with extreme fatigue and generalized weakness for the last several weeks.  He was found to have a hemoglobin of 5.6.  He otherwise feels well.  He has no neurologic complaints.  He denies any recent fevers or illnesses.  He has a good appetite and denies weight loss.  He has no chest pain, shortness of breath, cough, or hemoptysis.  He denies any bony pain.  He has no nausea, vomiting, constipation, or diarrhea.  He has no melena or hematochezia.  He has no urinary complaints.  Patient otherwise feels well and offers no further specific complaints.  REVIEW OF SYSTEMS:   Review of Systems  Constitutional: Positive for malaise/fatigue. Negative for fever and weight loss.  Respiratory: Negative.  Negative for cough, hemoptysis and shortness of breath.   Cardiovascular: Negative.  Negative for chest pain and leg swelling.  Gastrointestinal: Negative.  Negative for abdominal pain, blood in stool and melena.  Genitourinary: Negative.  Negative for hematuria.  Musculoskeletal: Negative.  Negative for back pain.  Skin: Negative.  Negative for rash.  Neurological: Positive for weakness. Negative for dizziness, focal weakness and headaches.  Psychiatric/Behavioral: Negative.  The patient is not nervous/anxious.     As per HPI. Otherwise, a complete review of systems is negative.  PAST MEDICAL HISTORY: Past Medical History:   Diagnosis Date  . A-fib ([place])   . Cancer ([place])   . Hypertension     PAST SURGICAL HISTORY: Past Surgical History:  Procedure Laterality Date  . CARDIAC SURGERY      FAMILY HISTORY: Family History  Problem Relation Age of Onset  . Healthy Mother     ADVANCED DIRECTIVES (Y/N):  '@ADVDIR'$ @  HEALTH MAINTENANCE: Social History   Tobacco Use  . Smoking status: Never Smoker  . Smokeless tobacco: Never Used  Substance Use Topics  . Alcohol use: Not Currently  . Drug use: Never     Colonoscopy:  PAP:  Bone density:  Lipid panel:  No Known Allergies  Current Facility-Administered Medications  Medication Dose Route Frequency Provider Last Rate Last Dose  . 0.9 %  sodium chloride infusion  250 mL Intravenous PRN Demetrios Loll, MD      . acetaminophen (TYLENOL) tablet 650 mg  650 mg Oral Q6H PRN Demetrios Loll, MD       Or  . acetaminophen (TYLENOL) suppository 650 mg  650 mg Rectal Q6H PRN Demetrios Loll, MD      . albuterol (PROVENTIL) (2.5 MG/3ML) 0.083% nebulizer solution 2.5 mg  2.5 mg Nebulization Q2H PRN Demetrios Loll, MD      . Derrill Memo ON 05/13/2019] aspirin EC tablet 81 mg  81 mg Oral Daily Demetrios Loll, MD      . Derrill Memo ON 05/13/2019] digoxin (LANOXIN) tablet 0.25 mg  0.25 mg Oral Daily Demetrios Loll, MD      . furosemide (LASIX) tablet 80 mg  80 mg Oral BID Demetrios Loll, MD      . heparin injection  5,000 Units  5,000 Units Subcutaneous Q8H Demetrios Loll, MD      . HYDROcodone-acetaminophen (NORCO/VICODIN) 5-325 MG per tablet 1 tablet  1 tablet Oral Q4H PRN Demetrios Loll, MD      . ondansetron [facility]) tablet 4 mg  4 mg Oral Q6H PRN Demetrios Loll, MD       Or  . ondansetron [facility]) injection 4 mg  4 mg Intravenous Q6H PRN Demetrios Loll, MD      . potassium chloride 20 MEQ/15ML (10%) solution 30 mEq  30 mEq Oral Daily Demetrios Loll, MD      . senna-docusate (Senokot-S) tablet 1 tablet  1 tablet Oral QHS PRN Demetrios Loll, MD      . sodium chloride flush (NS) 0.9 % injection 3 mL  3 mL Intravenous Q12H Demetrios Loll, MD      . sodium chloride flush (NS) 0.9 % injection 3 mL  3 mL Intravenous PRN Demetrios Loll, MD        OBJECTIVE: Vitals:   05/12/19 1650 05/12/19 1958  BP: (!) 104/55 (!) 108/47  Pulse: 74 71  Resp: 18 16  Temp: 98.1 F (36.7 C) 97.8 F (36.6 C)  SpO2: 100% 100%     Body mass index is 30.7 kg/m.    ECOG FS:1 - Symptomatic but completely ambulatory  General: Well-developed, well-nourished, no acute distress. Eyes: Pink conjunctiva, anicteric sclera. HEENT: Normocephalic, moist mucous membranes, clear oropharnyx. Lungs: Clear to auscultation bilaterally. Heart: Regular rate and rhythm. No rubs, murmurs, or gallops. Abdomen: Soft, nontender, nondistended. No organomegaly noted, normoactive bowel sounds. Musculoskeletal: No edema, cyanosis, or clubbing. Neuro: Alert, answering all questions appropriately. Cranial nerves grossly intact. Skin: No rashes or petechiae noted. Psych: Normal affect. Lymphatics: No cervical, calvicular, axillary or inguinal LAD.   LAB RESULTS:  Lab Results  Component Value Date   NA 136 05/12/2019   K 3.4 (L) 05/12/2019   CL 101 05/12/2019   CO2 27 05/12/2019   GLUCOSE 99 05/12/2019   BUN 21 05/12/2019   CREATININE 1.80 (H) 05/12/2019   CALCIUM 8.3 (L) 05/12/2019   PROT 8.4 (H) 07/24/2012   ALBUMIN 4.0 07/24/2012   AST 32 07/24/2012   ALT 37 07/24/2012   ALKPHOS 129 07/24/2012   BILITOT 0.6 07/24/2012   GFRNONAA 39 (L) 05/12/2019   GFRAA 45 (L) 05/12/2019    Lab Results  Component Value Date   WBC 4.6 05/12/2019   NEUTROABS 3.0 05/12/2019   HGB 5.6 (L) 05/12/2019   HCT 20.2 (L) 05/12/2019   MCV 66.4 (L) 05/12/2019   PLT 207 05/12/2019     STUDIES: No results found.  ASSESSMENT: Severe anemia with history of multiple myeloma.  PLAN:    1. Severe anemia with history of multiple myeloma: Patient has no evidence of blood loss.  This is highly suspicious for recurrence of his myeloma and full work-up has been initiated.   Other than his anemia and some mild renal insufficiency, patient has no other evidence of endorgan damage.  He will require metastatic bone survey as an outpatient after discharge.  Transfuse and maintain hemoglobin greater than 7.0.  At which point patient can be discharged and complete his work-up as an outpatient in the cancer center. 2.  Renal insufficiency: Unclear patient's baseline.  Continue to monitor closely. 3.  Anemia: Transfuse and maintain hemoglobin greater than 7.0.  Appreciate consult, will follow.  Lloyd Huger, MD   05/12/2019 8:26 PM

## 2019-05-12 NOTE — ED Notes (Signed)
Dr. Chen at bedside.

## 2019-05-12 NOTE — Progress Notes (Signed)
Advanced Care Plan.  Purpose of Encounter: CODE STATUS. Parties in Attendance: The patient and me. Patient's Decisional Capacity: Yes. Medical Story: Chris George  is a 63 y.o. male with a known history of A. fib, multiple myeloma and hypertension.  The patient is being admitted for anemia with hemoglobin 5.6.  I discussed with patient about current condition, prognosis and CODE STATUS.  The patient does want to be resuscitated and intubated if he has cardiopulmonary arrest. Plan:  Code Status: Full code. Time spent discussing advance care planning: 17 minutes.

## 2019-05-12 NOTE — ED Notes (Signed)
Attempted report x 1. Will call back in a few minutes.

## 2019-05-13 ENCOUNTER — Other Ambulatory Visit: Payer: Self-pay | Admitting: Oncology

## 2019-05-13 LAB — CBC
HCT: 23 % — ABNORMAL LOW (ref 39.0–52.0)
Hemoglobin: 6.7 g/dL — ABNORMAL LOW (ref 13.0–17.0)
MCH: 20.4 pg — ABNORMAL LOW (ref 26.0–34.0)
MCHC: 29.1 g/dL — ABNORMAL LOW (ref 30.0–36.0)
MCV: 69.9 fL — ABNORMAL LOW (ref 80.0–100.0)
Platelets: 179 10*3/uL (ref 150–400)
RBC: 3.29 MIL/uL — ABNORMAL LOW (ref 4.22–5.81)
RDW: 23.1 % — ABNORMAL HIGH (ref 11.5–15.5)
WBC: 4.6 10*3/uL (ref 4.0–10.5)
nRBC: 0 % (ref 0.0–0.2)

## 2019-05-13 LAB — BASIC METABOLIC PANEL
Anion gap: 5 (ref 5–15)
BUN: 19 mg/dL (ref 8–23)
CO2: 29 mmol/L (ref 22–32)
Calcium: 8.2 mg/dL — ABNORMAL LOW (ref 8.9–10.3)
Chloride: 103 mmol/L (ref 98–111)
Creatinine, Ser: 1.73 mg/dL — ABNORMAL HIGH (ref 0.61–1.24)
GFR calc Af Amer: 48 mL/min — ABNORMAL LOW (ref >60)
GFR calc non Af Amer: 41 mL/min — ABNORMAL LOW (ref >60)
Glucose, Bld: 177 mg/dL — ABNORMAL HIGH (ref 70–99)
Potassium: 3.6 mmol/L (ref 3.5–5.1)
Sodium: 137 mmol/L (ref 135–145)

## 2019-05-13 LAB — HEMOGLOBIN AND HEMATOCRIT, BLOOD
HCT: 22.8 % — ABNORMAL LOW (ref 39.0–52.0)
Hemoglobin: 6.5 g/dL — ABNORMAL LOW (ref 13.0–17.0)

## 2019-05-13 LAB — NOVEL CORONAVIRUS, NAA (HOSP ORDER, SEND-OUT TO REF LAB; TAT 18-24 HRS): SARS-CoV-2, NAA: NOT DETECTED

## 2019-05-13 LAB — IRON AND TIBC
Iron: 31 ug/dL — ABNORMAL LOW (ref 45–182)
Saturation Ratios: 8 % — ABNORMAL LOW (ref 17.9–39.5)
TIBC: 391 ug/dL (ref 250–450)
UIBC: 360 ug/dL

## 2019-05-13 LAB — FERRITIN: Ferritin: 4 ng/mL — ABNORMAL LOW (ref 24–336)

## 2019-05-13 LAB — MAGNESIUM: Magnesium: 1.8 mg/dL (ref 1.7–2.4)

## 2019-05-13 LAB — HEMOGLOBIN: Hemoglobin: 7.3 g/dL — ABNORMAL LOW (ref 13.0–17.0)

## 2019-05-13 LAB — PREPARE RBC (CROSSMATCH)

## 2019-05-13 MED ORDER — SODIUM CHLORIDE 0.9% IV SOLUTION
Freq: Once | INTRAVENOUS | Status: AC
  Administered 2019-05-13: 06:00:00 via INTRAVENOUS

## 2019-05-13 MED ORDER — FERROUS SULFATE 325 (65 FE) MG PO TBEC: 325.0000 mg | DELAYED_RELEASE_TABLET | Freq: Two times a day (BID) | ORAL | 0 refills | Status: DC

## 2019-05-13 MED ORDER — DOCUSATE SODIUM 100 MG PO CAPS: 100.0000 mg | ORAL_CAPSULE | Freq: Two times a day (BID) | ORAL | 0 refills | Status: DC

## 2019-05-13 NOTE — Progress Notes (Signed)
Pt d/c to home via self. IV removed intact. VSS. Education completed. Prescriptions given to pt. All questions answered. All belongings sent with pt.

## 2019-05-13 NOTE — Discharge Summary (Signed)
[place] at [place] NAME: Chris George    MR#:  [id number]  DATE OF BIRTH:  11/22/1955  DATE OF ADMISSION:  05/12/2019   ADMITTING PHYSICIAN: Demetrios Loll, MD  DATE OF DISCHARGE: 05/13/2019  PRIMARY CARE PHYSICIAN: Sofie Hartigan, MD   ADMISSION DIAGNOSIS:  Symptomatic anemia [D64.9] DISCHARGE DIAGNOSIS:  Active Problems:   Anemia  SECONDARY DIAGNOSIS:   Past Medical History:  Diagnosis Date  . A-fib ([place])   . Cancer ([employer])   . Hypertension    HOSPITAL COURSE:   Chief complaint; anemia  History of presenting complaint; Chris George  is a 63 y.o. male with a known history of A.fib, multiple myeloma and hypertension.  The patient has had fatigue and generalized weakness for about 2 weeks.  He denies any melena or bloody stool.  Denies any other symptoms.  He is found hemoglobin 5.6.  ED physician request admission   Hospital course; Anemia of chronic disease Patient presented with acute on chronic anemia with hemoglobin of 5.6.  Likely due to underlying multiple myeloma.  Was transfused a total of 2 units of packed red blood cells during this admission with improvement in hemoglobin to 7.3.  No evidence of any bleeding clinically.  Iron studies done with iron saturation of 8%.  Likely has component of iron deficiency anemia as well.  Started on ferrous sulfate.  Patient seen by oncologist Dr. Grayland Ormond.  Suspicion of recurrence of myeloma.  Plan is to follow-up with oncologist post discharge from the hospital for further work-up to include metastatic bone survey. Follow-up with primary care physician and oncologist post discharge from the hospital. Patient has chronic kidney disease stage III.  Renal function fairly stable.  Chronic atrial fibrillation.  Rate controlled.  Not on anticoagulation due to anemia requiring packed red blood cell transfusion.  Continue digoxin.  Has history of hypertension.  Blood pressure controlled on  current regimen Patient clinically and hemodynamically stable.  Denies any weakness or shortness of breath.  Remains asymptomatic.  Plans for discharge.   DISCHARGE CONDITIONS:  Stable CONSULTS OBTAINED:  Treatment Team:  Tomma Rakers, MD DRUG ALLERGIES:  No Known Allergies DISCHARGE MEDICATIONS:   Allergies as of 05/13/2019   No Known Allergies     Medication List    TAKE these medications   aspirin EC 81 MG tablet Take 81 mg by mouth daily.   Digox 0.25 MG tablet Generic drug: digoxin Take 0.25 mg by mouth daily.   docusate sodium 100 MG capsule Commonly known as: Colace Take 1 capsule (100 mg total) by mouth 2 (two) times daily.   ferrous sulfate 325 (65 FE) MG EC tablet Take 1 tablet (325 mg total) by mouth 2 (two) times daily.   furosemide 80 MG tablet Commonly known as: LASIX Take 80 mg by mouth 2 (two) times daily.   Klor-Con/EF 25 MEQ disintegrating tablet Generic drug: potassium bicarbonate Take 25 mEq by mouth daily.        DISCHARGE INSTRUCTIONS:   DIET:  Cardiac diet DISCHARGE CONDITION:  Stable ACTIVITY:  Activity as tolerated OXYGEN:  Home Oxygen: No.  Oxygen Delivery: room air DISCHARGE LOCATION:  home   If you experience worsening of your admission symptoms, develop shortness of breath, life threatening emergency, suicidal or homicidal thoughts you must seek medical attention immediately by calling 911 or calling your MD immediately  if symptoms less severe.  You Must read complete instructions/literature along with all the possible  adverse reactions/side effects for all the Medicines you take and that have been prescribed to you. Take any new Medicines after you have completely understood and accpet all the possible adverse reactions/side effects.   Please note  You were cared for by a hospitalist during your hospital stay. If you have any questions about your discharge medications or the care you received while you were in  the hospital after you are discharged, you can call the unit and asked to speak with the hospitalist on call if the hospitalist that took care of you is not available. Once you are discharged, your primary care physician will handle any further medical issues. Please note that NO REFILLS for any discharge medications will be authorized once you are discharged, as it is imperative that you return to your primary care physician (or establish a relationship with a primary care physician if you do not have one) for your aftercare needs so that they can reassess your need for medications and monitor your lab values.    On the day of Discharge:  VITAL SIGNS:  Blood pressure 105/62, pulse 62, temperature 97.9 F (36.6 C), temperature source Oral, resp. rate 14, height _0  (1.753 m), weight 94.3 kg, SpO2 100 %. PHYSICAL EXAMINATION:  GENERAL:  63 y.o.-year-old patient lying in the bed with no acute distress.  EYES: Pupils equal, round, reactive to light and accommodation. No scleral icterus. Extraocular muscles intact.  HEENT: Head atraumatic, normocephalic. Oropharynx and nasopharynx clear.  NECK:  Supple, no jugular venous distention. No thyroid enlargement, no tenderness.  LUNGS: Normal breath sounds bilaterally, no wheezing, rales,rhonchi or crepitation. No use of accessory muscles of respiration.  CARDIOVASCULAR: S1, S2 normal. No murmurs, rubs, or gallops.  ABDOMEN: Soft, non-tender, non-distended. Bowel sounds present. No organomegaly or mass.  EXTREMITIES: No pedal edema, cyanosis, or clubbing.  NEUROLOGIC: Cranial nerves II through XII are intact. Muscle strength 5/5 in all extremities. Sensation intact. Gait not checked.  PSYCHIATRIC: The patient is alert and oriented x 3.  SKIN: No obvious rash, lesion, or ulcer.  DATA REVIEW:   CBC Recent Labs  Lab 05/13/19 1054 05/13/19 1259  WBC 4.6  --   HGB 6.7* 7.3*  HCT 23.0*  --   PLT 179  --     Chemistries  Recent Labs  Lab 05/13/19  1054  NA 137  K 3.6  CL 103  CO2 29  GLUCOSE 177*  BUN 19  CREATININE 1.73*  CALCIUM 8.2*  MG 1.8     Microbiology Results  No results found for this or any previous visit.  RADIOLOGY:  No results found.   Management plans discussed with the patient, family and they are in agreement.  CODE STATUS: Full Code   TOTAL TIME TAKING CARE OF THIS PATIENT: 38 minutes.    Steven Basso M.D on 05/13/2019 at 1:51 PM  Between 7am to 6pm - Pager - [phone]  After 6pm go to [email or web address] - Proofreader  [employer] [facility]  Office  [phone]  CC: Primary care physician; Sofie Hartigan, MD   Note: This dictation was prepared with Dragon dictation along with smaller phrase technology. Any transcriptional errors that result from this process are unintentional.

## 2019-05-13 NOTE — TOC Initial Note (Signed)
Transition of Care [facility]) - Initial/Assessment Note    Patient Details  Name: Chris George MRN: [id number] Date of Birth: 07-Jun-1956  [facility]) CM/SW Contact:    Ravenne Wayment, Lenice Llamas Phone Number: [phone]  05/13/2019, 9:50 AM  Clinical Narrative: Clinical Social Worker (CSW) met with patient to discuss D/C plan. Patient was alert and oriented X4 and was sitting on the edge of the bed independently reading a book. CSW introduced self and explained role of CSW department. Per patient he lives alone in [place] and will follow up with Dr. Grayland [facility]rmond at the [facility]. Patient reported that he is independent with his ADLS and has no needs. CSW will continue to follow and assist as needed.               Expected Discharge Plan: (Home with self care.) Barriers to Discharge: Continued Medical Work up   Patient Goals and CMS Choice Patient states their goals for this hospitalization and ongoing recovery are:: To feel better      Expected Discharge Plan and Services Expected Discharge Plan: (Home with self care.) In-house Referral: Clinical Social Work Discharge Planning Services: CM Consult   Living arrangements for the past 2 months: Single Family Home                                      Prior Living Arrangements/Services Living arrangements for the past 2 months: Single Family Home Lives with:: Self Patient language and need for interpreter reviewed:: No Do you feel safe going back to the place where you live?: Yes      Need for Family Participation in Patient Care: No (Comment) Care giver support system in place?: No (comment)   Criminal Activity/Legal Involvement Pertinent to Current Situation/Hospitalization: No - Comment as needed  Activities of Daily Living Home Assistive Devices/Equipment: None ADL Screening (condition at time of admission) Patient's cognitive ability adequate to safely complete daily activities?: Yes Is the patient  deaf or have difficulty hearing?: No Does the patient have difficulty seeing, even when wearing glasses/contacts?: No Does the patient have difficulty concentrating, remembering, or making decisions?: No Patient able to express need for assistance with ADLs?: Yes Does the patient have difficulty dressing or bathing?: Yes Independently performs ADLs?: Yes (appropriate for developmental age) Does the patient have difficulty walking or climbing stairs?: No Weakness of Legs: None Weakness of Arms/Hands: None  Permission Sought/Granted Permission sought to share information with : Case Manager Permission granted to share information with : Yes, Verbal Permission Granted              Emotional Assessment Appearance:: Appears stated age   Affect (typically observed): Calm, Pleasant Orientation: : Oriented to Self, Oriented to Place, Oriented to  Time, Oriented to Situation Alcohol / Substance Use: Not Applicable Psych Involvement: No (comment)  Admission diagnosis:  Symptomatic anemia [D64.9] Patient Active Problem List   Diagnosis Date Noted  . Anemia 05/12/2019   PCP:  Sofie Hartigan, MD Pharmacy:   [facility] [place], [place] [facility] [address] AT [name] [address] Phone: [phone] Fax: [phone]     Social Determinants of Health (SDOH) Interventions    Readmission Risk Interventions No flowsheet data found.

## 2019-05-13 NOTE — Progress Notes (Signed)
1 unit of blood transfused. H & H drawn 1 hour after completion with a hgb of 6.5. NP Se[facility] notified with orders for 1 unit of blood. Patient has no complaints.  Stacie Glaze, RN

## 2019-05-13 NOTE — Care Management Obs Status (Signed)
[place] NOTIFICATION   Patient Details  Name: Chris George MRN: [id number] Date of Birth: June 16, 1956   Medicare Observation Status Notification Given:  Yes    Jillene Wehrenberg, Lenice Llamas 05/13/2019, 9:48 AM

## 2019-05-14 LAB — TYPE AND SCREEN
ABO/RH(D): O POS
Antibody Screen: NEGATIVE
Unit division: 0
Unit division: 0

## 2019-05-14 LAB — PROTEIN ELECTRO, RANDOM URINE
Albumin ELP, Urine: 45.4 %
Alpha-1-Globulin, U: 5.2 %
Alpha-2-Globulin, U: 8.6 %
Beta Globulin, U: 29.2 %
Gamma Globulin, U: 11.7 %
M Component, Ur: 8.2 % — ABNORMAL HIGH
Total Protein, Urine: 15.3 mg/dL

## 2019-05-14 LAB — BPAM RBC
Blood Product Expiration Date: 202008052359
Blood Product Expiration Date: 202008052359
ISSUE DATE / TIME: 202007071628
ISSUE DATE / TIME: 202007080544
Unit Type and Rh: 5100
Unit Type and Rh: 5100

## 2019-05-14 LAB — KAPPA/LAMBDA LIGHT CHAINS
Kappa free light chain: 59.7 mg/L — ABNORMAL HIGH (ref 3.3–19.4)
Kappa, lambda light chain ratio: 1.88 — ABNORMAL HIGH (ref 0.26–1.65)
Lambda free light chains: 31.7 mg/L — ABNORMAL HIGH (ref 5.7–26.3)

## 2019-05-14 LAB — IGG, IGA, IGM
IgA: 58 mg/dL — ABNORMAL LOW (ref 61–437)
IgG (Immunoglobin G), Serum: 2751 mg/dL — ABNORMAL HIGH (ref 603–1613)
IgM (Immunoglobulin M), Srm: 41 mg/dL (ref 20–172)

## 2019-05-14 LAB — HIV ANTIBODY (ROUTINE TESTING W REFLEX): HIV Screen 4th Generation wRfx: NONREACTIVE

## 2019-05-15 ENCOUNTER — Other Ambulatory Visit: Payer: Self-pay

## 2019-05-15 ENCOUNTER — Other Ambulatory Visit: Payer: Self-pay | Admitting: Oncology

## 2019-05-15 DIAGNOSIS — C9000 Multiple myeloma not having achieved remission: Secondary | ICD-10-CM

## 2019-05-15 LAB — PROTEIN ELECTROPHORESIS, SERUM
A/G Ratio: 0.8 (ref 0.7–1.7)
Albumin ELP: 3.2 g/dL (ref 2.9–4.4)
Alpha-1-Globulin: 0.3 g/dL (ref 0.0–0.4)
Alpha-2-Globulin: 0.7 g/dL (ref 0.4–1.0)
Beta Globulin: 0.8 g/dL (ref 0.7–1.3)
Gamma Globulin: 2.2 g/dL — ABNORMAL HIGH (ref 0.4–1.8)
Globulin, Total: 4 g/dL — ABNORMAL HIGH (ref 2.2–3.9)
M-Spike, %: 1.9 g/dL — ABNORMAL HIGH
Total Protein ELP: 7.2 g/dL (ref 6.0–8.5)

## 2019-05-17 DIAGNOSIS — C9000 Multiple myeloma not having achieved remission: Secondary | ICD-10-CM | POA: Insufficient documentation

## 2019-05-17 NOTE — Progress Notes (Signed)
Chris George  Telephone:(336) [phone] Fax:(336) [phone]  ID: BURHANUDDIN KOHLMANN OB: 02-Nov-1956  MR#: [id number]  NID#:[id number]  Patient Care Team: Sofie Hartigan, MD as PCP - General (Family Medicine)  CHIEF COMPLAINT: Multiple myeloma  INTERVAL HISTORY: Patient is a 63 year old male who was initially evaluated as a consult in the hospital who had not been seen since July 2014.  He was admitted for severe anemia with concern of recurrence of his multiple myeloma.  He feels significantly improved after blood transfusions.  He has a mild peripheral neuropathy, but no other neurologic complaints.  He denies any pain.  He continues to have weakness and fatigue.  He has a good appetite and denies weight loss.  He has no chest pain, shortness of breath, cough, or hemoptysis.  He denies any nausea, vomiting, constipation, or diarrhea.  He has no melena or hematochezia.  He has no urinary complaints.  Patient otherwise feels well and offers no further specific complaints today.  REVIEW OF SYSTEMS:   Review of Systems  Constitutional: Positive for malaise/fatigue. Negative for fever and weight loss.  Respiratory: Negative.  Negative for cough, hemoptysis and shortness of breath.   Cardiovascular: Negative.  Negative for chest pain and leg swelling.  Gastrointestinal: Negative.  Negative for abdominal pain, blood in stool and melena.  Genitourinary: Negative.  Negative for hematuria.  Musculoskeletal: Negative.  Negative for back pain.  Skin: Negative.  Negative for rash.  Neurological: Negative.  Negative for dizziness, focal weakness, weakness and headaches.  Psychiatric/Behavioral: Negative.  The patient is not nervous/anxious.     As per HPI. Otherwise, a complete review of systems is negative.  PAST MEDICAL HISTORY: Past Medical History:  Diagnosis Date  . A-fib ([place])   . Cancer ([place])   . Hypertension     PAST SURGICAL HISTORY: Past Surgical History:  Procedure  Laterality Date  . CARDIAC SURGERY      FAMILY HISTORY: Family History  Problem Relation Age of Onset  . Healthy Mother     ADVANCED DIRECTIVES (Y/N):  N  HEALTH MAINTENANCE: Social History   Tobacco Use  . Smoking status: Never Smoker  . Smokeless tobacco: Never Used  Substance Use Topics  . Alcohol use: Not Currently  . Drug use: Never     Colonoscopy:  PAP:  Bone density:  Lipid panel:  No Known Allergies  Current Outpatient Medications  Medication Sig Dispense Refill  . aspirin EC 81 MG tablet Take 81 mg by mouth daily.    . digoxin (DIGOX) 0.25 MG tablet Take 0.25 mg by mouth daily.     Marland Kitchen docusate sodium (COLACE) 100 MG capsule Take 1 capsule (100 mg total) by mouth 2 (two) times daily. 60 capsule 0  . ferrous sulfate 325 (65 FE) MG EC tablet Take 1 tablet (325 mg total) by mouth 2 (two) times daily. 60 tablet 0  . furosemide (LASIX) 80 MG tablet Take 80 mg by mouth 2 (two) times daily.     . potassium bicarbonate (KLOR-CON/EF) 25 MEQ disintegrating tablet Take 25 mEq by mouth daily.      No current facility-administered medications for this visit.     OBJECTIVE: Vitals:   05/18/19 1124  BP: 112/71  Pulse: 61  Resp: 18     Body mass index is 32.05 kg/m.    ECOG FS:0 - Asymptomatic  General: Well-developed, well-nourished, no acute distress. Eyes: Pink conjunctiva, anicteric sclera. HEENT: Normocephalic, moist mucous membranes, clear oropharnyx. Lungs: Clear to  auscultation bilaterally. Heart: Regular rate and rhythm. No rubs, murmurs, or gallops. Abdomen: Soft, nontender, nondistended. No organomegaly noted, normoactive bowel sounds. Musculoskeletal: No edema, cyanosis, or clubbing. Neuro: Alert, answering all questions appropriately. Cranial nerves grossly intact. Skin: No rashes or petechiae noted. Psych: Normal affect. Lymphatics: No cervical, calvicular, axillary or inguinal LAD.   LAB RESULTS:  Lab Results  Component Value Date   NA 137  05/18/2019   K 3.4 (L) 05/18/2019   CL 107 05/18/2019   CO2 26 05/18/2019   GLUCOSE 100 (H) 05/18/2019   BUN 21 05/18/2019   CREATININE 1.90 (H) 05/18/2019   CALCIUM 8.3 (L) 05/18/2019   PROT 8.4 (H) 07/24/2012   ALBUMIN 4.0 07/24/2012   AST 32 07/24/2012   ALT 37 07/24/2012   ALKPHOS 129 07/24/2012   BILITOT 0.6 07/24/2012   GFRNONAA 37 (L) 05/18/2019   GFRAA 43 (L) 05/18/2019    Lab Results  Component Value Date   WBC 5.2 05/18/2019   NEUTROABS 3.6 05/18/2019   HGB 6.9 (L) 05/18/2019   HCT 23.9 (L) 05/18/2019   MCV 70.3 (L) 05/18/2019   PLT 156 05/18/2019     STUDIES: No results found.  ASSESSMENT: Multiple myeloma  PLAN:    1. Multiple myeloma: Patient was initially diagnosed and treated in [place], [place] and treated with single agent Velcade in approximately 2013.  He was last evaluated in clinic in July 2014 when he declined any maintenance treatment or referral for bone marrow transplant.  He was subsequently lost to follow-up.  More recently, SPEP revealed an M spike of 1.9 with an IgG predominance at 2751.  He also has a suppressed IgA level.  Kappa/lambda light chain ratio is only moderately elevated at 1.88.  He has severe anemia and mild renal insufficiency, but no other evidence of endorgan damage.  Patient will require repeat bone marrow biopsy to confirm the diagnosis.  Will also get a metastatic bone survey for completeness.  His last bone survey in 2013 was reported as normal.  Return to clinic in 2 weeks for discussion of his results and treatment planning if necessary. 2.  Anemia: Patient will return to clinic later this week for 1 unit of packed red blood cells. 3.  Renal insufficiency: Patient's creatinine is 1.9.  Monitor. 4.  Peripheral neuropathy: Unclear etiology.  Monitor.  Patient expressed understanding and was in agreement with this plan. He also understands that He can call clinic at any time with any questions, concerns, or complaints.    Cancer Staging No matching staging information was found for the patient.  Lloyd Huger, MD   05/19/2019 7:05 AM

## 2019-05-18 ENCOUNTER — Inpatient Hospital Stay (HOSPITAL_BASED_OUTPATIENT_CLINIC_OR_DEPARTMENT_OTHER): Payer: BC Managed Care – PPO | Admitting: Oncology

## 2019-05-18 ENCOUNTER — Encounter: Payer: Self-pay | Admitting: Oncology

## 2019-05-18 ENCOUNTER — Inpatient Hospital Stay: Payer: BC Managed Care – PPO

## 2019-05-18 ENCOUNTER — Inpatient Hospital Stay: Payer: BC Managed Care – PPO | Attending: Oncology

## 2019-05-18 ENCOUNTER — Other Ambulatory Visit: Payer: Self-pay | Admitting: *Deleted

## 2019-05-18 ENCOUNTER — Other Ambulatory Visit: Payer: Self-pay

## 2019-05-18 DIAGNOSIS — G629 Polyneuropathy, unspecified: Secondary | ICD-10-CM | POA: Insufficient documentation

## 2019-05-18 DIAGNOSIS — N289 Disorder of kidney and ureter, unspecified: Secondary | ICD-10-CM | POA: Insufficient documentation

## 2019-05-18 DIAGNOSIS — C9000 Multiple myeloma not having achieved remission: Secondary | ICD-10-CM | POA: Diagnosis present

## 2019-05-18 DIAGNOSIS — D649 Anemia, unspecified: Secondary | ICD-10-CM

## 2019-05-18 DIAGNOSIS — Z7189 Other specified counseling: Secondary | ICD-10-CM

## 2019-05-18 LAB — BASIC METABOLIC PANEL
Anion gap: 4 — ABNORMAL LOW (ref 5–15)
BUN: 21 mg/dL (ref 8–23)
CO2: 26 mmol/L (ref 22–32)
Calcium: 8.3 mg/dL — ABNORMAL LOW (ref 8.9–10.3)
Chloride: 107 mmol/L (ref 98–111)
Creatinine, Ser: 1.9 mg/dL — ABNORMAL HIGH (ref 0.61–1.24)
GFR calc Af Amer: 43 mL/min — ABNORMAL LOW (ref >60)
GFR calc non Af Amer: 37 mL/min — ABNORMAL LOW (ref >60)
Glucose, Bld: 100 mg/dL — ABNORMAL HIGH (ref 70–99)
Potassium: 3.4 mmol/L — ABNORMAL LOW (ref 3.5–5.1)
Sodium: 137 mmol/L (ref 135–145)

## 2019-05-18 LAB — CBC WITH DIFFERENTIAL/PLATELET
Abs Immature Granulocytes: 0.02 10*3/uL (ref 0.00–0.07)
Basophils Absolute: 0 10*3/uL (ref 0.0–0.1)
Basophils Relative: 1 %
Eosinophils Absolute: 0.2 10*3/uL (ref 0.0–0.5)
Eosinophils Relative: 3 %
HCT: 23.9 % — ABNORMAL LOW (ref 39.0–52.0)
Hemoglobin: 6.9 g/dL — ABNORMAL LOW (ref 13.0–17.0)
Immature Granulocytes: 0 %
Lymphocytes Relative: 16 %
Lymphs Abs: 0.8 10*3/uL (ref 0.7–4.0)
MCH: 20.3 pg — ABNORMAL LOW (ref 26.0–34.0)
MCHC: 28.9 g/dL — ABNORMAL LOW (ref 30.0–36.0)
MCV: 70.3 fL — ABNORMAL LOW (ref 80.0–100.0)
Monocytes Absolute: 0.6 10*3/uL (ref 0.1–1.0)
Monocytes Relative: 11 %
Neutro Abs: 3.6 10*3/uL (ref 1.7–7.7)
Neutrophils Relative %: 69 %
Platelets: 156 10*3/uL (ref 150–400)
RBC: 3.4 MIL/uL — ABNORMAL LOW (ref 4.22–5.81)
RDW: 24.7 % — ABNORMAL HIGH (ref 11.5–15.5)
WBC: 5.2 10*3/uL (ref 4.0–10.5)
nRBC: 0 % (ref 0.0–0.2)

## 2019-05-18 LAB — SAMPLE TO BLOOD BANK

## 2019-05-18 LAB — PREPARE RBC (CROSSMATCH)

## 2019-05-18 NOTE — Progress Notes (Signed)
Patient here today for hospital follow up regarding anemia, myeloma. Patient reports worsening numbness and tingling to feet and legs. Patient also has questions today regarding lab results.

## 2019-05-20 ENCOUNTER — Inpatient Hospital Stay: Payer: BC Managed Care – PPO

## 2019-05-20 ENCOUNTER — Ambulatory Visit
Admission: RE | Admit: 2019-05-20 | Discharge: 2019-05-20 | Disposition: A | Discharge status: Home / Self Care | Payer: BC Managed Care – PPO | Source: Ambulatory Visit | Attending: Oncology | Admitting: Oncology

## 2019-05-20 ENCOUNTER — Other Ambulatory Visit: Payer: Self-pay

## 2019-05-20 DIAGNOSIS — C9000 Multiple myeloma not having achieved remission: Secondary | ICD-10-CM

## 2019-05-20 MED ORDER — SODIUM CHLORIDE 0.9% IV SOLUTION
250.0000 mL | Freq: Once | INTRAVENOUS | Status: AC
  Administered 2019-05-20: 250 mL via INTRAVENOUS
  Filled 2019-05-20: qty 250

## 2019-05-20 MED ORDER — ACETAMINOPHEN 325 MG PO TABS
650.0000 mg | ORAL_TABLET | Freq: Once | ORAL | Status: DC
  Filled 2019-05-20: qty 2

## 2019-05-20 MED ORDER — DIPHENHYDRAMINE HCL 50 MG/ML IJ SOLN
25.0000 mg | Freq: Once | INTRAMUSCULAR | Status: AC
  Administered 2019-05-20: 25 mg via INTRAVENOUS
  Filled 2019-05-20: qty 1

## 2019-05-20 NOTE — Progress Notes (Signed)
Pt refused to take tylenol prior to transfusion, stating that he did not receive and premedications the last time he got a transfusion. Per Dr. Grayland Ormond ok to proceed with transfusion.

## 2019-05-21 LAB — TYPE AND SCREEN
ABO/RH(D): O POS
Antibody Screen: NEGATIVE
Unit division: 0

## 2019-05-21 LAB — BPAM RBC
Blood Product Expiration Date: 202008082359
ISSUE DATE / TIME: 202007150944
Unit Type and Rh: 5100

## 2019-05-27 ENCOUNTER — Other Ambulatory Visit: Payer: Self-pay | Admitting: Radiology

## 2019-05-28 ENCOUNTER — Ambulatory Visit
Admission: RE | Admit: 2019-05-28 | Discharge: 2019-05-28 | Disposition: A | Discharge status: Home / Self Care | Payer: BC Managed Care – PPO | Source: Ambulatory Visit | Attending: Oncology | Admitting: Oncology

## 2019-05-28 ENCOUNTER — Other Ambulatory Visit: Payer: Self-pay

## 2019-05-28 ENCOUNTER — Other Ambulatory Visit (HOSPITAL_COMMUNITY)
Admission: RE | Admit: 2019-05-28 | Discharge: 2019-05-28 | Disposition: A | Discharge status: Home / Self Care | Payer: BC Managed Care – PPO | Source: Ambulatory Visit | Attending: Oncology | Admitting: Oncology

## 2019-05-28 DIAGNOSIS — C9000 Multiple myeloma not having achieved remission: Secondary | ICD-10-CM | POA: Diagnosis present

## 2019-05-28 DIAGNOSIS — Z79899 Other long term (current) drug therapy: Secondary | ICD-10-CM | POA: Insufficient documentation

## 2019-05-28 DIAGNOSIS — I10 Essential (primary) hypertension: Secondary | ICD-10-CM | POA: Diagnosis not present

## 2019-05-28 DIAGNOSIS — I4891 Unspecified atrial fibrillation: Secondary | ICD-10-CM | POA: Diagnosis not present

## 2019-05-28 DIAGNOSIS — Z7982 Long term (current) use of aspirin: Secondary | ICD-10-CM | POA: Diagnosis not present

## 2019-05-28 HISTORY — DX: Cardiac arrhythmia, unspecified: I49.9

## 2019-05-28 LAB — CBC WITH DIFFERENTIAL/PLATELET
Abs Immature Granulocytes: 0.01 10*3/uL (ref 0.00–0.07)
Basophils Absolute: 0 10*3/uL (ref 0.0–0.1)
Basophils Relative: 1 %
Eosinophils Absolute: 0.2 10*3/uL (ref 0.0–0.5)
Eosinophils Relative: 4 %
HCT: 26.2 % — ABNORMAL LOW (ref 39.0–52.0)
Hemoglobin: 7.6 g/dL — ABNORMAL LOW (ref 13.0–17.0)
Immature Granulocytes: 0 %
Lymphocytes Relative: 19 %
Lymphs Abs: 0.8 10*3/uL (ref 0.7–4.0)
MCH: 21.1 pg — ABNORMAL LOW (ref 26.0–34.0)
MCHC: 29 g/dL — ABNORMAL LOW (ref 30.0–36.0)
MCV: 72.6 fL — ABNORMAL LOW (ref 80.0–100.0)
Monocytes Absolute: 0.6 10*3/uL (ref 0.1–1.0)
Monocytes Relative: 13 %
Neutro Abs: 2.8 10*3/uL (ref 1.7–7.7)
Neutrophils Relative %: 63 %
Platelets: 203 10*3/uL (ref 150–400)
RBC: 3.61 MIL/uL — ABNORMAL LOW (ref 4.22–5.81)
RDW: 24.8 % — ABNORMAL HIGH (ref 11.5–15.5)
WBC: 4.4 10*3/uL (ref 4.0–10.5)
nRBC: 0 % (ref 0.0–0.2)

## 2019-05-28 LAB — BASIC METABOLIC PANEL
Anion gap: 7 (ref 5–15)
BUN: 19 mg/dL (ref 8–23)
CO2: 24 mmol/L (ref 22–32)
Calcium: 8.7 mg/dL — ABNORMAL LOW (ref 8.9–10.3)
Chloride: 108 mmol/L (ref 98–111)
Creatinine, Ser: 1.62 mg/dL — ABNORMAL HIGH (ref 0.61–1.24)
GFR calc Af Amer: 52 mL/min — ABNORMAL LOW (ref >60)
GFR calc non Af Amer: 45 mL/min — ABNORMAL LOW (ref >60)
Glucose, Bld: 109 mg/dL — ABNORMAL HIGH (ref 70–99)
Potassium: 3.5 mmol/L (ref 3.5–5.1)
Sodium: 139 mmol/L (ref 135–145)

## 2019-05-28 LAB — PROTIME-INR
INR: 1.3 — ABNORMAL HIGH (ref 0.8–1.2)
Prothrombin Time: 16.2 seconds — ABNORMAL HIGH (ref 11.4–15.2)

## 2019-05-28 MED ORDER — SODIUM CHLORIDE 0.9 % IV SOLN: INTRAVENOUS | Status: DC

## 2019-05-28 MED ORDER — HEPARIN SOD (PORK) LOCK FLUSH 100 UNIT/ML IV SOLN
INTRAVENOUS | Status: AC
  Filled 2019-05-28: qty 5

## 2019-05-28 NOTE — Discharge Instructions (Signed)

## 2019-05-28 NOTE — Procedures (Signed)
Interventional Radiology Procedure Note  Procedure: CT guided aspirate and core biopsy of right posterior iliac bone Complications: None Recommendations:   - OTC's PRN  Pain - Follow biopsy results  Signed,  Dulcy Fanny. Earleen Newport, DO

## 2019-05-28 NOTE — H&P (Signed)
Chief Complaint: Mx Myeloma  Referring Physician(s): Finnegan,Timothy J  Supervising Physician: Corrie Mckusick  Patient Status: [facility] - Out-pt  History of Present Illness: Chris George is a 63 y.o. male presenting for a bone marrow biopsy, history of multiple myeloma.   He denies any symptoms, including fever, rigors, chills, or GI symptoms.   Past Medical History:  Diagnosis Date  . A-fib ([place])   . Cancer ([place])    multiple myeloma  . Dysrhythmia    atrial fib  . Hypertension     Past Surgical History:  Procedure Laterality Date  . CARDIAC SURGERY     Patient denies cardiac surgery    Allergies: Patient has no known allergies.  Medications: Prior to Admission medications   Medication Sig Start Date End Date Taking? Authorizing Provider  aspirin EC 81 MG tablet Take 81 mg by mouth daily.   Yes [provider]  digoxin (DIGOX) 0.25 MG tablet Take 0.25 mg by mouth daily.    Yes [provider]  ferrous sulfate 325 (65 FE) MG EC tablet Take 1 tablet (325 mg total) by mouth 2 (two) times daily. 05/13/19 05/12/20 Yes Ojie, Jude, MD  furosemide (LASIX) 80 MG tablet Take 80 mg by mouth 2 (two) times daily.    Yes [provider]  potassium bicarbonate (KLOR-CON/EF) 25 MEQ disintegrating tablet Take 25 mEq by mouth daily.    Yes [provider]  docusate sodium (COLACE) 100 MG capsule Take 1 capsule (100 mg total) by mouth 2 (two) times daily. Patient not taking: Reported on 05/28/2019 05/13/19 05/12/20  Otila Back, MD     Family History  Problem Relation Age of Onset  . Healthy Mother     Social History   Socioeconomic History  . Marital status: Divorced    Spouse name: Not on file  . Number of children: Not on file  . Years of education: Not on file  . Highest education level: Not on file  Occupational History  . Not on file  Social Needs  . Financial resource strain: Not hard at all  . Food insecurity    Worry: Never true   Inability: Never true  . Transportation needs    Medical: No    Non-medical: No  Tobacco Use  . Smoking status: Never Smoker  . Smokeless tobacco: Never Used  Substance and Sexual Activity  . Alcohol use: Not Currently  . Drug use: Never  . Sexual activity: Not on file  Lifestyle  . Physical activity    Days per week: 0 days    Minutes per session: 0 min  . Stress: To some extent  Relationships  . Social connections    Talks on phone: More than three times a week    Gets together: More than three times a week    Attends religious service: More than 4 times per year    Active member of club or organization: Yes    Attends meetings of clubs or organizations: More than 4 times per year    Relationship status: Divorced  Other Topics Concern  . Not on file  Social History Narrative  . Not on file       Review of Systems: A 12 point ROS discussed and pertinent positives are indicated in the HPI above.  All other systems are negative.  Review of Systems  Vital Signs: BP 136/82   Pulse (!) 54   Temp 98.4 F (36.9 C) (Oral)   Resp 20  Ht '5\' 9"'$  (1.753 m)   Wt 97.5 kg   SpO2 99%   BMI 31.75 kg/m   Physical Exam General: 63 yo male appearing older than stated age.  Well-developed, well-nourished.  No distress. HEENT: Atraumatic, normocephalic.  Conjugate gaze, extra-ocular motor intact. No scleral icterus or scleral injection. No lesions on external ears, nose, lips, or gums.   Neck: Symmetric with no goiter enlargement.  Chest/Lungs:  Symmetric chest with inspiration/expiration.  No labored breathing.  Clear to auscultation with no wheezes, rhonchi, or rales.  Heart:  RRR, with no third heart sounds appreciated. No JVD appreciated.  Abdomen:  Soft, NT/ND, with + bowel sounds.   Genito-urinary: Deferred Neurologic: Alert & Oriented to person, place, and time.   Normal affect and insight.  Appropriate questions.  Moving all 4 extremities with gross sensory intact.      Imaging: Dg Bone Survey Met  Result Date: 05/20/2019 CLINICAL DATA:  Multiple myeloma. Evaluate remission status. Follow-up exam. EXAM: METASTATIC BONE SURVEY COMPARISON:  02/15/2012 FINDINGS: There are no bone lesions. Specifically there are no lesions suspicious for multiple myeloma. Degenerative changes with large bridging anterior osteophytes are noted along the cervical spine, with osteophytes becoming more pronounced since the prior bone survey. No other change. IMPRESSION: No bone lesions to suggest multiple myeloma. Electronically Signed   By: Lajean Manes M.D.   On: 05/20/2019 20:25    Labs:  CBC: Recent Labs    05/12/19 1203 05/13/19 0132 05/13/19 1054 05/13/19 1259 05/18/19 1054  WBC 4.6  --  4.6  --  5.2  HGB 5.6* 6.5* 6.7* 7.3* 6.9*  HCT 20.2* 22.8* 23.0*  --  23.9*  PLT 207  --  179  --  156    COAGS: No results for input(s): INR, APTT in the last 8760 hours.  BMP: Recent Labs    05/12/19 1203 05/13/19 1054 05/18/19 1054  NA 136 137 137  K 3.4* 3.6 3.4*  CL 101 103 107  CO2 '27 29 26  '$ GLUCOSE 99 177* 100*  BUN '21 19 21  '$ CALCIUM 8.3* 8.2* 8.3*  CREATININE 1.80* 1.73* 1.90*  GFRNONAA 39* 41* 37*  GFRAA 45* 48* 43*    LIVER FUNCTION TESTS: No results for input(s): BILITOT, AST, ALT, ALKPHOS, PROT, ALBUMIN in the last 8760 hours.  TUMOR MARKERS: No results for input(s): AFPTM, CEA, CA199, CHROMGRNA in the last 8760 hours.  Assessment and Plan:  63 yo male with history of multiple myeloma.    He presents for bone marrow biopsy.   Thank you for this interesting consult.  I greatly enjoyed meeting Chris George and look forward to participating in their care.  A copy of this report was sent to the requesting provider on this date.  Electronically Signed: Corrie Mckusick, DO 05/28/2019, 8:22 AM   I spent a total of  15 Minutes   in face to face in clinical consultation, greater than 50% of which was counseling/coordinating care for bone marrow biopsy.

## 2019-05-29 ENCOUNTER — Telehealth: Payer: Self-pay | Admitting: *Deleted

## 2019-05-29 NOTE — Telephone Encounter (Signed)
If he can wait, that would be best.  But I would work with him if it needs to get done.

## 2019-05-29 NOTE — Telephone Encounter (Signed)
Patient called stating he has question regarding his treatment and if it will interfere with other treatments he is going to be getting (oral surgery) in September. I spoke with him and explained that if he is going to get chemotherapy ( this has not been determined as to if , what or when) surgery would have to be organized with treatment or put off.. He understood this and knows that he will not anything about treatment until his appointment on 8/4and decided he will call oral surgeon and postpone his appointment until he speaks with Dr Grayland Ormond on 8/4

## 2019-06-07 NOTE — Progress Notes (Signed)
[place]  Telephone:(336) [phone] Fax:(336) [phone]  ID: Chris George OB: 12/19/1955  MR#: [id number]  IOM#:[id number]  Patient Care Team: Sofie Hartigan, MD as PCP - General (Family Medicine)  CHIEF COMPLAINT: Smoldering myeloma  INTERVAL HISTORY: Patient returns to clinic today for hospital follow-up and discussion of his bone marrow biopsy results.  He is anxious, but otherwise feels well.  He has a mild peripheral neuropathy, but no other neurologic complaints.  He denies any pain.  He does not complain of weakness or fatigue today.  He has a good appetite and denies weight loss.  He has no chest pain, shortness of breath, cough, or hemoptysis.  He denies any nausea, vomiting, constipation, or diarrhea.  He has no melena or hematochezia.  He has no urinary complaints.  Patient offers no further specific complaints today.  REVIEW OF SYSTEMS:   Review of Systems  Constitutional: Negative.  Negative for fever, malaise/fatigue and weight loss.  Respiratory: Negative.  Negative for cough, hemoptysis and shortness of breath.   Cardiovascular: Negative.  Negative for chest pain and leg swelling.  Gastrointestinal: Negative.  Negative for abdominal pain, blood in stool and melena.  Genitourinary: Negative.  Negative for hematuria.  Musculoskeletal: Negative.  Negative for back pain.  Skin: Negative.  Negative for rash.  Neurological: Negative.  Negative for dizziness, focal weakness, weakness and headaches.  Psychiatric/Behavioral: Negative.  The patient is not nervous/anxious.     As per HPI. Otherwise, a complete review of systems is negative.  PAST MEDICAL HISTORY: Past Medical History:  Diagnosis Date  . A-fib ([place])   . Cancer ([place])    multiple myeloma  . Dysrhythmia    atrial fib  . Hypertension     PAST SURGICAL HISTORY: Past Surgical History:  Procedure Laterality Date  . CARDIAC SURGERY     Patient denies cardiac surgery    FAMILY  HISTORY: Family History  Problem Relation Age of Onset  . Healthy Mother     ADVANCED DIRECTIVES (Y/N):  N  HEALTH MAINTENANCE: Social History   Tobacco Use  . Smoking status: Never Smoker  . Smokeless tobacco: Never Used  Substance Use Topics  . Alcohol use: Not Currently  . Drug use: Never     Colonoscopy:  PAP:  Bone density:  Lipid panel:  No Known Allergies  Current Outpatient Medications  Medication Sig Dispense Refill  . aspirin EC 81 MG tablet Take 81 mg by mouth daily.    . digoxin (DIGOX) 0.25 MG tablet Take 0.25 mg by mouth daily.     Marland Kitchen docusate sodium (COLACE) 100 MG capsule Take 1 capsule (100 mg total) by mouth 2 (two) times daily. 60 capsule 0  . ferrous sulfate 325 (65 FE) MG EC tablet Take 1 tablet (325 mg total) by mouth 2 (two) times daily. 60 tablet 0  . furosemide (LASIX) 80 MG tablet Take 80 mg by mouth 2 (two) times daily.     . potassium bicarbonate (KLOR-CON/EF) 25 MEQ disintegrating tablet Take 25 mEq by mouth daily.      No current facility-administered medications for this visit.     OBJECTIVE: Vitals:   06/09/19 1449  BP: 140/61  Pulse: (!) 118  Resp: 20     Body mass index is 31.01 kg/m.    ECOG FS:0 - Asymptomatic  General: Well-developed, well-nourished, no acute distress. Eyes: Pink conjunctiva, anicteric sclera. HEENT: Normocephalic, moist mucous membranes, clear oropharnyx. Lungs: Clear to auscultation bilaterally. Heart: Regular rate  and rhythm. No rubs, murmurs, or gallops. Abdomen: Soft, nontender, nondistended. No organomegaly noted, normoactive bowel sounds. Musculoskeletal: No edema, cyanosis, or clubbing. Neuro: Alert, answering all questions appropriately. Cranial nerves grossly intact. Skin: No rashes or petechiae noted. Psych: Normal affect. Lymphatics: No cervical, calvicular, axillary or inguinal LAD.  LAB RESULTS:  Lab Results  Component Value Date   NA 139 05/28/2019   K 3.5 05/28/2019   CL 108  05/28/2019   CO2 24 05/28/2019   GLUCOSE 109 (H) 05/28/2019   BUN 19 05/28/2019   CREATININE 1.62 (H) 05/28/2019   CALCIUM 8.7 (L) 05/28/2019   PROT 8.4 (H) 07/24/2012   ALBUMIN 4.0 07/24/2012   AST 32 07/24/2012   ALT 37 07/24/2012   ALKPHOS 129 07/24/2012   BILITOT 0.6 07/24/2012   GFRNONAA 45 (L) 05/28/2019   GFRAA 52 (L) 05/28/2019    Lab Results  Component Value Date   WBC 4.4 05/28/2019   NEUTROABS 2.8 05/28/2019   HGB 7.6 (L) 05/28/2019   HCT 26.2 (L) 05/28/2019   MCV 72.6 (L) 05/28/2019   PLT 203 05/28/2019   Lab Results  Component Value Date   IRON 31 (L) 05/13/2019   TIBC 391 05/13/2019   IRONPCTSAT 8 (L) 05/13/2019   Lab Results  Component Value Date   FERRITIN 4 (L) 05/13/2019     STUDIES: Dg Bone Survey Met  Result Date: 05/20/2019 CLINICAL DATA:  Multiple myeloma. Evaluate remission status. Follow-up exam. EXAM: METASTATIC BONE SURVEY COMPARISON:  02/15/2012 FINDINGS: There are no bone lesions. Specifically there are no lesions suspicious for multiple myeloma. Degenerative changes with large bridging anterior osteophytes are noted along the cervical spine, with osteophytes becoming more pronounced since the prior bone survey. No other change. IMPRESSION: No bone lesions to suggest multiple myeloma. Electronically Signed   By: Lajean Manes M.D.   On: 05/20/2019 20:25   Ct Bone Marrow Biopsy & Aspiration  Result Date: 05/28/2019 INDICATION: 63 year old male with a history of multiple myeloma EXAM: CT BONE MARROW BIOPSY AND ASPIRATION MEDICATIONS: None. ANESTHESIA/SEDATION: No moderate sedation. The patient's level of consciousness and vital signs were monitored continuously by radiology nursing throughout the procedure under my direct supervision. FLUOROSCOPY TIME:  CT COMPLICATIONS: None PROCEDURE: The procedure risks, benefits, and alternatives were explained to the patient. Questions regarding the procedure were encouraged and answered. The patient  understands and consents to the procedure. Scout CT of the pelvis was performed for surgical planning purposes. The right posterior pelvis was prepped with Chlorhexidine in a sterile fashion, and a sterile drape was applied covering the operative field. A sterile gown and sterile gloves were used for the procedure. Local anesthesia was provided with 1% Lidocaine. Posterior right iliac bone was targeted for biopsy. The skin and subcutaneous tissues were infiltrated with 1% lidocaine without epinephrine. A small stab incision was made with an 11 blade scalpel, and an 11 gauge Murphy needle was advanced with CT guidance to the posterior cortex. Manual forced was used to advance the needle through the posterior cortex and the stylet was removed. A bone marrow aspirate was retrieved and passed to a cytotechnologist in the room. The Murphy needle was then advanced without the stylet for a core biopsy. The core biopsy was retrieved and also passed to a cytotechnologist. Manual pressure was used for hemostasis and a sterile dressing was placed. No complications were encountered no significant blood loss was encountered. Patient tolerated the procedure well and remained hemodynamically stable throughout. IMPRESSION: Status post CT-guided bone  marrow biopsy, with tissue specimen sent to pathology for complete histopathologic analysis Signed, Dulcy Fanny. Earleen Newport, DO [facility] Electronically Signed   By: Corrie Mckusick D.O.   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  He was last evaluated in clinic in July 2014 when he declined any maintenance treatment or referral for bone marrow transplant.  He was subsequently lost to follow-up.  More recently, SPEP revealed an M spike of 1.9 with an IgG  predominance at 2751.  He also has a suppressed IgA level.  Kappa/lambda light chain ratio is only moderately elevated at 1.88.  He has severe anemia and mild renal insufficiency, but no other evidence of endorgan damage.  Metastatic bone survey on May 20, 2019 reviewed independently and report as above with no obvious bony lesions.  Bone marrow biopsy completed on May 28, 2019 revealed only 10 to 15% plasma cells with no clonality reported.  Cytogenetics were also reported as normal.  Patient will likely progress to overt myeloma in the near future, but he does not require treatment at this time.  Return to clinic in 3 months with repeat laboratory work and further evaluation.   2.  Anemia: Patient has significantly reduced hemoglobin and iron stores.  Return to clinic in 1 and 2 weeks for IV Feraheme and then in 3 months as above.  Patient will also require colonoscopy in the near future. 3.  Renal insufficiency: Patient's creatinine has improved to 1.6, monitor. 4.  Peripheral neuropathy: Chronic and unchanged.    I spent a total of 30 minutes face-to-face with the patient of which greater than 50% of the visit was spent in counseling and coordination of care as detailed above.   Patient expressed understanding and was in agreement with this plan. He also understands that He can call clinic at any time with any questions, concerns, or complaints.    Lloyd Huger, MD   06/10/2019 4:24 PM

## 2019-06-08 ENCOUNTER — Encounter (HOSPITAL_COMMUNITY): Payer: Self-pay | Admitting: Oncology

## 2019-06-09 ENCOUNTER — Encounter: Payer: Self-pay | Admitting: Oncology

## 2019-06-09 ENCOUNTER — Other Ambulatory Visit: Payer: Self-pay

## 2019-06-09 ENCOUNTER — Inpatient Hospital Stay: Payer: BC Managed Care – PPO | Attending: Oncology | Admitting: Oncology

## 2019-06-09 VITALS — BP 140/61 | HR 118 | Resp 20 | Wt 210.0 lb

## 2019-06-09 DIAGNOSIS — Z7982 Long term (current) use of aspirin: Secondary | ICD-10-CM | POA: Diagnosis not present

## 2019-06-09 DIAGNOSIS — I10 Essential (primary) hypertension: Secondary | ICD-10-CM | POA: Diagnosis not present

## 2019-06-09 DIAGNOSIS — Z79899 Other long term (current) drug therapy: Secondary | ICD-10-CM | POA: Diagnosis not present

## 2019-06-09 DIAGNOSIS — G629 Polyneuropathy, unspecified: Secondary | ICD-10-CM | POA: Insufficient documentation

## 2019-06-09 DIAGNOSIS — C9000 Multiple myeloma not having achieved remission: Secondary | ICD-10-CM | POA: Insufficient documentation

## 2019-06-09 DIAGNOSIS — I4891 Unspecified atrial fibrillation: Secondary | ICD-10-CM | POA: Insufficient documentation

## 2019-06-09 DIAGNOSIS — D649 Anemia, unspecified: Secondary | ICD-10-CM | POA: Diagnosis not present

## 2019-06-09 NOTE — Progress Notes (Signed)
Patient here today for results.  

## 2019-06-11 ENCOUNTER — Encounter (HOSPITAL_COMMUNITY): Payer: Self-pay | Admitting: Oncology

## 2019-06-15 ENCOUNTER — Telehealth: Payer: Self-pay | Admitting: *Deleted

## 2019-06-15 NOTE — Telephone Encounter (Signed)
Patient called requesting Dr Grayland Ormond return his call to discuss other treatment options for his cancer [phone]

## 2019-06-15 NOTE — Telephone Encounter (Signed)
[place], thanks.  Yes, this is all on the oral surgeon.

## 2019-06-15 NOTE — Telephone Encounter (Signed)
I'm confused.  We discussed at length last week that he does not require treatment at this time and to return to clinic in 3 months. He is getting feraheme, so not sure what he means by "other" treatment options.

## 2019-06-15 NOTE — Telephone Encounter (Signed)
He said for his MM

## 2019-06-15 NOTE — Telephone Encounter (Signed)
His concern was regarding the fact that the oral surgeon has told him that he does not want to do his oral implants because of his disease. I again reiterated your conversation that he does not need treatment at this time and that the Bone Survey did not show any lytic lesions. I told him that was the oral surgeons decision not to proceed and that he may need to have further conversation with him, but he does not want to do that. He was upset with them for cancelling his appointments without discussing it with him first. He said this has answered his questions. He was worried we did not tell him something when the oral surgeon refused to do his implants

## 2019-06-15 NOTE — Telephone Encounter (Signed)
Still confused.  He does not need treatment at this time.

## 2019-06-17 ENCOUNTER — Inpatient Hospital Stay: Payer: BC Managed Care – PPO

## 2019-06-18 ENCOUNTER — Telehealth: Payer: Self-pay | Admitting: *Deleted

## 2019-06-18 ENCOUNTER — Encounter: Payer: Self-pay | Admitting: *Deleted

## 2019-06-18 NOTE — Telephone Encounter (Signed)
Yes, ok for him to return to work.

## 2019-06-18 NOTE — Telephone Encounter (Signed)
Letter completed, copy faxed to patients HR dept and copy mailed to him as well.

## 2019-06-18 NOTE — Telephone Encounter (Signed)
Patient called reporting that he needs a note for work because he has been out a few days and that it is OK for him to work. He asked he be called for specifics

## 2019-06-23 ENCOUNTER — Other Ambulatory Visit: Payer: Self-pay

## 2019-06-24 ENCOUNTER — Other Ambulatory Visit: Payer: Self-pay

## 2019-06-24 ENCOUNTER — Inpatient Hospital Stay: Payer: BC Managed Care – PPO

## 2019-06-24 VITALS — BP 111/68 | HR 67 | Temp 97.0°F | Resp 16

## 2019-06-24 DIAGNOSIS — C9000 Multiple myeloma not having achieved remission: Secondary | ICD-10-CM | POA: Diagnosis not present

## 2019-06-24 DIAGNOSIS — D509 Iron deficiency anemia, unspecified: Secondary | ICD-10-CM

## 2019-06-24 MED ORDER — SODIUM CHLORIDE 0.9 % IV SOLN
510.0000 mg | Freq: Once | INTRAVENOUS | Status: AC
  Administered 2019-06-24: 510 mg via INTRAVENOUS
  Filled 2019-06-24: qty 17

## 2019-06-24 MED ORDER — SODIUM CHLORIDE 0.9 % IV SOLN
Freq: Once | INTRAVENOUS | Status: AC
  Administered 2019-06-24: 14:00:00 via INTRAVENOUS
  Filled 2019-06-24: qty 250

## 2019-06-30 ENCOUNTER — Other Ambulatory Visit: Payer: Self-pay

## 2019-07-01 ENCOUNTER — Other Ambulatory Visit: Payer: Self-pay

## 2019-07-01 ENCOUNTER — Inpatient Hospital Stay: Payer: BC Managed Care – PPO

## 2019-07-01 VITALS — BP 105/65 | HR 70 | Resp 18

## 2019-07-01 DIAGNOSIS — C9000 Multiple myeloma not having achieved remission: Secondary | ICD-10-CM | POA: Diagnosis not present

## 2019-07-01 DIAGNOSIS — D509 Iron deficiency anemia, unspecified: Secondary | ICD-10-CM

## 2019-07-01 MED ORDER — SODIUM CHLORIDE 0.9 % IV SOLN
Freq: Once | INTRAVENOUS | Status: AC
  Administered 2019-07-01: 14:00:00 via INTRAVENOUS
  Filled 2019-07-01: qty 250

## 2019-07-01 MED ORDER — SODIUM CHLORIDE 0.9 % IV SOLN
510.0000 mg | Freq: Once | INTRAVENOUS | Status: AC
  Administered 2019-07-01: 14:00:00 510 mg via INTRAVENOUS
  Filled 2019-07-01: qty 17

## 2019-07-02 ENCOUNTER — Ambulatory Visit: Payer: BC Managed Care – PPO | Admitting: Gastroenterology

## 2019-08-13 ENCOUNTER — Ambulatory Visit: Payer: BC Managed Care – PPO | Admitting: Gastroenterology

## 2019-09-09 ENCOUNTER — Inpatient Hospital Stay: Payer: BC Managed Care – PPO

## 2019-09-09 ENCOUNTER — Ambulatory Visit (INDEPENDENT_AMBULATORY_CARE_PROVIDER_SITE_OTHER): Payer: BC Managed Care – PPO | Admitting: Gastroenterology

## 2019-09-09 ENCOUNTER — Other Ambulatory Visit: Payer: Self-pay

## 2019-09-09 ENCOUNTER — Inpatient Hospital Stay: Payer: BC Managed Care – PPO | Attending: Oncology

## 2019-09-09 ENCOUNTER — Encounter: Payer: Self-pay | Admitting: Gastroenterology

## 2019-09-09 VITALS — BP 103/64 | HR 79 | Temp 97.8°F | Ht 69.0 in | Wt 219.4 lb

## 2019-09-09 DIAGNOSIS — D509 Iron deficiency anemia, unspecified: Secondary | ICD-10-CM | POA: Diagnosis not present

## 2019-09-09 DIAGNOSIS — E611 Iron deficiency: Secondary | ICD-10-CM

## 2019-09-09 DIAGNOSIS — C9000 Multiple myeloma not having achieved remission: Secondary | ICD-10-CM

## 2019-09-09 DIAGNOSIS — D500 Iron deficiency anemia secondary to blood loss (chronic): Secondary | ICD-10-CM

## 2019-09-09 LAB — CBC WITH DIFFERENTIAL/PLATELET
Abs Immature Granulocytes: 0.02 10*3/uL (ref 0.00–0.07)
Basophils Absolute: 0 10*3/uL (ref 0.0–0.1)
Basophils Relative: 1 %
Eosinophils Absolute: 0.2 10*3/uL (ref 0.0–0.5)
Eosinophils Relative: 5 %
HCT: 32.2 % — ABNORMAL LOW (ref 39.0–52.0)
Hemoglobin: 10.1 g/dL — ABNORMAL LOW (ref 13.0–17.0)
Immature Granulocytes: 1 %
Lymphocytes Relative: 18 %
Lymphs Abs: 0.7 10*3/uL (ref 0.7–4.0)
MCH: 25.7 pg — ABNORMAL LOW (ref 26.0–34.0)
MCHC: 31.4 g/dL (ref 30.0–36.0)
MCV: 81.9 fL (ref 80.0–100.0)
Monocytes Absolute: 0.6 10*3/uL (ref 0.1–1.0)
Monocytes Relative: 16 %
Neutro Abs: 2.3 10*3/uL (ref 1.7–7.7)
Neutrophils Relative %: 59 %
Platelets: 168 10*3/uL (ref 150–400)
RBC: 3.93 MIL/uL — ABNORMAL LOW (ref 4.22–5.81)
RDW: 17.8 % — ABNORMAL HIGH (ref 11.5–15.5)
WBC: 3.8 10*3/uL — ABNORMAL LOW (ref 4.0–10.5)
nRBC: 0 % (ref 0.0–0.2)

## 2019-09-09 LAB — FERRITIN: Ferritin: 11 ng/mL — ABNORMAL LOW (ref 24–336)

## 2019-09-09 LAB — BASIC METABOLIC PANEL
Anion gap: 8 (ref 5–15)
BUN: 19 mg/dL (ref 8–23)
CO2: 26 mmol/L (ref 22–32)
Calcium: 8.3 mg/dL — ABNORMAL LOW (ref 8.9–10.3)
Chloride: 103 mmol/L (ref 98–111)
Creatinine, Ser: 1.55 mg/dL — ABNORMAL HIGH (ref 0.61–1.24)
GFR calc Af Amer: 54 mL/min — ABNORMAL LOW (ref >60)
GFR calc non Af Amer: 47 mL/min — ABNORMAL LOW (ref >60)
Glucose, Bld: 108 mg/dL — ABNORMAL HIGH (ref 70–99)
Potassium: 3.1 mmol/L — ABNORMAL LOW (ref 3.5–5.1)
Sodium: 137 mmol/L (ref 135–145)

## 2019-09-09 LAB — IRON AND TIBC
Iron: 35 ug/dL — ABNORMAL LOW (ref 45–182)
Saturation Ratios: 10 % — ABNORMAL LOW (ref 17.9–39.5)
TIBC: 357 ug/dL (ref 250–450)
UIBC: 322 ug/dL

## 2019-09-09 NOTE — Progress Notes (Signed)
Gastroenterology Consultation  Referring Provider:     Lloyd Huger, MD Primary Care Physician:  Chris Hartigan, MD Primary Gastroenterologist:  Chris George     Reason for Consultation:     Iron deficiency anemia        HPI:   Chris George is a 63 y.o. y/o male referred for consultation & management of iron deficiency anemia by Chris George, Chris Noa, MD.  This patient comes in with a history of iron deficiency anemia.  The patient denies ever having a colonoscopy in the past.  The patient was found to have anemia with a low MCV and low iron studies.  The patient's labs showed:  Component     Latest Ref Rng & Units 05/13/2019 05/18/2019 05/28/2019 09/09/2019        12:59 PM     Hemoglobin     13.0 - 17.0 g/dL 7.3 (L) 6.9 (L) 7.6 (L) 10.1 (L)  HCT     39.0 - 52.0 %  23.9 (L) 26.2 (L) 32.2 (L)  MCV     80.0 - 100.0 fL  70.3 (L) 72.6 (L) 81.9   With his iron studies showing: Component     Latest Ref Rng & Units 05/13/2019 09/09/2019  Iron     45 - 182 ug/dL 31 (L) 35 (L)  TIBC     250 - 450 ug/dL 391 357  Saturation Ratios     17.9 - 39.5 % 8 (L) 10 (L)  UIBC     ug/dL 360 322    The patient denies any sign of rectal bleeding or hematemesis.  There is no report of any change in bowel habits or unexplained weight loss.  The patient does state that he has been trying to watch his weight and has lost weight with that process.  There is no report of any abdominal pain and he denies any family history of colon cancer or colon polyps.   Past Medical History:  Diagnosis Date  . A-fib ([place])   . Cancer ([place])    multiple myeloma  . Dysrhythmia    atrial fib  . Hypertension     Past Surgical History:  Procedure Laterality Date  . CARDIAC SURGERY     Patient denies cardiac surgery    Prior to Admission medications   Medication Sig Start Date End Date Taking? Authorizing Provider  aspirin EC 81 MG tablet Take 81 mg by mouth daily.   Yes [provider]   colchicine 0.6 MG tablet Take by mouth. 07/18/18  Yes [provider]  digoxin (DIGOX) 0.25 MG tablet Take 0.25 mg by mouth daily.    Yes [provider]  docusate sodium (COLACE) 100 MG capsule Take 1 capsule (100 mg total) by mouth 2 (two) times daily. 05/13/19 05/12/20 Yes Chris, Jude, MD  ferrous sulfate 325 (65 FE) MG EC tablet Take 1 tablet (325 mg total) by mouth 2 (two) times daily. 05/13/19 05/12/20 Yes Chris, Jude, MD  furosemide (LASIX) 80 MG tablet Take 80 mg by mouth 2 (two) times daily.    Yes [provider]  potassium bicarbonate (KLOR-CON/EF) 25 MEQ disintegrating tablet Take 25 mEq by mouth daily.    Yes [provider]    Family History  Problem Relation Age of Onset  . Healthy Mother      Social History   Tobacco Use  . Smoking status: Never Smoker  . Smokeless tobacco: Never Used  Substance Use Topics  .  Alcohol use: Not Currently  . Drug use: Never    Allergies as of 09/09/2019  . (No Known Allergies)    Review of Systems:    All systems reviewed and negative except where noted in HPI.   Physical Exam:  BP 103/64   Pulse 79   Temp 97.8 F (36.6 C) (Temporal)   Ht _0  (1.753 m)   Wt 219 lb 6.4 oz (99.5 kg)   BMI 32.40 kg/m  No LMP for male patient. General:   Alert,  Well-developed, well-nourished, pleasant and cooperative in NAD Head:  Normocephalic and atraumatic. Eyes:  Sclera clear, no icterus.   Conjunctiva pink. Ears:  Normal auditory acuity. Neck:  Supple; no masses or thyromegaly. Lungs:  Respirations even and unlabored.  Clear throughout to auscultation.   No wheezes, crackles, or rhonchi. No acute distress. Heart:  Regular rate and rhythm; no murmurs, clicks, rubs, or gallops. Abdomen:  Normal bowel sounds.  No bruits.  Soft, non-tender and non-distended without masses, hepatosplenomegaly or hernias noted.  No guarding or rebound tenderness.  Negative Carnett sign.   Rectal:  Deferred.  Msk:  Symmetrical  without gross deformities.  Good, equal movement & strength bilaterally. Pulses:  Normal pulses noted. Extremities:  No clubbing or edema.  No cyanosis. Neurologic:  Alert and oriented x3;  grossly normal neurologically. Skin:  Intact without significant lesions or rashes.  No jaundice. Lymph Nodes:  No significant cervical adenopathy. Psych:  Alert and cooperative. Normal mood and affect.  Imaging Studies: No results found.  Assessment and Plan:   Chris George is a 63 y.o. y/o male who comes in today with a history of iron deficiency anemia with low iron saturation and low MCV.  The patient's hemoglobin and hematocrit has also been low.  The patient has been told that this can be from GI blood loss and should have his GI tract investigated.  The patient will be set up for an EGD and colonoscopy.  I have discussed risks & benefits which include, but are not limited to, bleeding, infection, perforation & drug reaction.  The patient agrees with this plan & written consent will be obtained.       Chris Lame, MD. Chris George    Note: This dictation was prepared with Dragon dictation along with smaller phrase technology. Any transcriptional errors that result from this process are unintentional.

## 2019-09-10 LAB — KAPPA/LAMBDA LIGHT CHAINS
Kappa free light chain: 50.1 mg/L — ABNORMAL HIGH (ref 3.3–19.4)
Kappa, lambda light chain ratio: 2.17 — ABNORMAL HIGH (ref 0.26–1.65)
Lambda free light chains: 23.1 mg/L (ref 5.7–26.3)

## 2019-09-10 LAB — PROTEIN ELECTROPHORESIS, SERUM
A/G Ratio: 0.9 (ref 0.7–1.7)
Albumin ELP: 3.6 g/dL (ref 2.9–4.4)
Alpha-1-Globulin: 0.3 g/dL (ref 0.0–0.4)
Alpha-2-Globulin: 0.5 g/dL (ref 0.4–1.0)
Beta Globulin: 0.9 g/dL (ref 0.7–1.3)
Gamma Globulin: 2.4 g/dL — ABNORMAL HIGH (ref 0.4–1.8)
Globulin, Total: 4.1 g/dL — ABNORMAL HIGH (ref 2.2–3.9)
M-Spike, %: 2 g/dL — ABNORMAL HIGH
Total Protein ELP: 7.7 g/dL (ref 6.0–8.5)

## 2019-09-10 LAB — IGG, IGA, IGM
IgA: 53 mg/dL — ABNORMAL LOW (ref 61–437)
IgG (Immunoglobin G), Serum: 2949 mg/dL — ABNORMAL HIGH (ref 603–1613)
IgM (Immunoglobulin M), Srm: 49 mg/dL (ref 20–172)

## 2019-09-11 NOTE — Progress Notes (Signed)
[place]  Telephone:(336) [phone] [phone]) [phone]  ID: Chris George OB: 03-24-1956  MR#: [id number]  OYD#:[id number]  Patient Care Team: Sofie Hartigan, MD as PCP - General (Family Medicine)  CHIEF COMPLAINT: Smoldering myeloma  INTERVAL HISTORY: Patient returns to clinic today for repeat laboratory work and consideration of additional IV Feraheme.  He currently feels well and is back to his baseline.  He remains active and is playing tennis 2-3 times per week.  He does not complain of peripheral neuropathy today.  He has no other neurological complaints. He denies any pain.  He does not complain of weakness or fatigue today.  He has a good appetite and denies weight loss.  He has no chest pain, shortness of breath, cough, or hemoptysis.  He denies any nausea, vomiting, constipation, or diarrhea.  He has no melena or hematochezia.  He has no urinary complaints.  Patient offers no specific complaints today.  REVIEW OF SYSTEMS:   Review of Systems  Constitutional: Negative.  Negative for fever, malaise/fatigue and weight loss.  Respiratory: Negative.  Negative for cough, hemoptysis and shortness of breath.   Cardiovascular: Negative.  Negative for chest pain and leg swelling.  Gastrointestinal: Negative.  Negative for abdominal pain, blood in stool and melena.  Genitourinary: Negative.  Negative for hematuria.  Musculoskeletal: Negative.  Negative for back pain.  Skin: Negative.  Negative for rash.  Neurological: Negative.  Negative for dizziness, focal weakness, weakness and headaches.  Psychiatric/Behavioral: Negative.  The patient is not nervous/anxious.     As per HPI. Otherwise, a complete review of systems is negative.  PAST MEDICAL HISTORY: Past Medical History:  Diagnosis Date  . A-fib ([place])   . Cancer ([place])    multiple myeloma  . Dysrhythmia    atrial fib  . Hypertension     PAST SURGICAL HISTORY: Past Surgical History:  Procedure  Laterality Date  . CARDIAC SURGERY     Patient denies cardiac surgery    FAMILY HISTORY: Family History  Problem Relation Age of Onset  . Healthy Mother     ADVANCED DIRECTIVES (Y/N):  N  HEALTH MAINTENANCE: Social History   Tobacco Use  . Smoking status: Never Smoker  . Smokeless tobacco: Never Used  Substance Use Topics  . Alcohol use: Not Currently  . Drug use: Never     Colonoscopy:  PAP:  Bone density:  Lipid panel:  No Known Allergies  Current Outpatient Medications  Medication Sig Dispense Refill  . aspirin EC 81 MG tablet Take 81 mg by mouth daily.    . colchicine 0.6 MG tablet Take by mouth.    . digoxin (DIGOX) 0.25 MG tablet Take 0.25 mg by mouth daily.     Marland Kitchen docusate sodium (COLACE) 100 MG capsule Take 1 capsule (100 mg total) by mouth 2 (two) times daily. 60 capsule 0  . ferrous sulfate 325 (65 FE) MG EC tablet Take 1 tablet (325 mg total) by mouth 2 (two) times daily. 60 tablet 0  . furosemide (LASIX) 80 MG tablet Take 80 mg by mouth 2 (two) times daily.     . potassium bicarbonate (KLOR-CON/EF) 25 MEQ disintegrating tablet Take 25 mEq by mouth daily.      No current facility-administered medications for this visit.     OBJECTIVE: Vitals:   09/18/19 1312  BP: 134/87  Pulse: 89  Temp: (!) 96.9 F (36.1 C)     Body mass index is 32.62 kg/m.    ECOG  FS:0 - Asymptomatic  General: Well-developed, well-nourished, no acute distress. Eyes: Pink conjunctiva, anicteric sclera. HEENT: Normocephalic, moist mucous membranes. Lungs: Clear to auscultation bilaterally. Heart: Regular rate and rhythm. No rubs, murmurs, or gallops. Abdomen: Soft, nontender, nondistended. No organomegaly noted, normoactive bowel sounds. Musculoskeletal: No edema, cyanosis, or clubbing. Neuro: Alert, answering all questions appropriately. Cranial nerves grossly intact. Skin: No rashes or petechiae noted. Psych: Normal affect.  LAB RESULTS:  Lab Results  Component Value  Date   NA 137 09/09/2019   K 3.1 (L) 09/09/2019   CL 103 09/09/2019   CO2 26 09/09/2019   GLUCOSE 108 (H) 09/09/2019   BUN 19 09/09/2019   CREATININE 1.55 (H) 09/09/2019   CALCIUM 8.3 (L) 09/09/2019   PROT 8.4 (H) 07/24/2012   ALBUMIN 4.0 07/24/2012   AST 32 07/24/2012   ALT 37 07/24/2012   ALKPHOS 129 07/24/2012   BILITOT 0.6 07/24/2012   GFRNONAA 47 (L) 09/09/2019   GFRAA 54 (L) 09/09/2019    Lab Results  Component Value Date   WBC 3.8 (L) 09/09/2019   NEUTROABS 2.3 09/09/2019   HGB 10.1 (L) 09/09/2019   HCT 32.2 (L) 09/09/2019   MCV 81.9 09/09/2019   PLT 168 09/09/2019   Lab Results  Component Value Date   IRON 35 (L) 09/09/2019   TIBC 357 09/09/2019   IRONPCTSAT 10 (L) 09/09/2019   Lab Results  Component Value Date   FERRITIN 11 (L) 09/09/2019     STUDIES: No results found.  ASSESSMENT: Smoldering myeloma  PLAN:    1.  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  He was last evaluated in clinic in July 2014 when he declined any maintenance treatment or referral for bone marrow transplant.  He was subsequently lost to follow-up.  His most recent SPEP revealed an M spike of 2.0 with an IgG predominance of 2949.  These are unchanged from 3 months prior.  He also has a suppressed IgA level.  Kappa/lambda light chain ratio has trended up slightly to 2.17.  Both his creatinine and iron deficiency anemia are improving.  He has no other evidence of endorgan damage. Metastatic bone survey on May 20, 2019 reviewed independently with no obvious bony lesions.  Bone marrow biopsy completed on May 28, 2019 revealed only 10 to 15% plasma cells with no clonality reported.  Cytogenetics were also reported as normal.  Patient will likely progress to overt myeloma in the near future, but he does not require treatment at this time.  Return to clinic in 4 months with repeat  laboratory can further evaluation. 2.  Iron deficiency anemia: Patient's hemoglobin has improved, but his iron stores remain reduced.  Proceed with 510 mg IV Feraheme today.  Patient will return to clinic in the next several weeks for second infusion.  He will then return to clinic in 4 months as above for consideration of additional treatment.  Patient also will require colonoscopy in the near future. 3.  Renal insufficiency: Patient's creatinine continues to trend down and is now 1.55. 4.  Peripheral neuropathy: Chronic and unchanged.  Patient does not complain of this today.  Patient expressed understanding and was in agreement with this plan. He also understands that He can call clinic at any time with any questions, concerns, or complaints.    Lloyd Huger, MD   09/18/2019 1:39 PM

## 2019-09-18 ENCOUNTER — Inpatient Hospital Stay: Payer: BC Managed Care – PPO

## 2019-09-18 ENCOUNTER — Inpatient Hospital Stay (HOSPITAL_BASED_OUTPATIENT_CLINIC_OR_DEPARTMENT_OTHER): Payer: BC Managed Care – PPO | Admitting: Oncology

## 2019-09-18 ENCOUNTER — Encounter: Payer: Self-pay | Admitting: Oncology

## 2019-09-18 ENCOUNTER — Other Ambulatory Visit: Payer: Self-pay

## 2019-09-18 VITALS — BP 134/87 | HR 89 | Temp 96.9°F | Wt 220.9 lb

## 2019-09-18 DIAGNOSIS — D509 Iron deficiency anemia, unspecified: Secondary | ICD-10-CM

## 2019-09-18 DIAGNOSIS — C9000 Multiple myeloma not having achieved remission: Secondary | ICD-10-CM

## 2019-09-18 MED ORDER — SODIUM CHLORIDE 0.9 % IV SOLN
510.0000 mg | Freq: Once | INTRAVENOUS | Status: AC
  Administered 2019-09-18: 510 mg via INTRAVENOUS
  Filled 2019-09-18: qty 17

## 2019-09-18 MED ORDER — SODIUM CHLORIDE 0.9 % IV SOLN
INTRAVENOUS | Status: DC
  Administered 2019-09-18: 14:00:00 via INTRAVENOUS
  Filled 2019-09-18: qty 250

## 2019-09-18 NOTE — Progress Notes (Signed)
Pt here for follow up for anemia. States that he feels great and has started playing tennis again.

## 2019-09-29 ENCOUNTER — Encounter: Payer: Self-pay | Admitting: *Deleted

## 2019-09-29 ENCOUNTER — Other Ambulatory Visit: Payer: Self-pay

## 2019-09-30 ENCOUNTER — Telehealth: Payer: Self-pay | Admitting: Gastroenterology

## 2019-09-30 NOTE — Discharge Instructions (Signed)

## 2019-09-30 NOTE — Telephone Encounter (Signed)
Patient called to cx his colonoscopy on 10-05-19 & would like to r/s .

## 2019-09-30 NOTE — Telephone Encounter (Signed)
Pt has been rescheduled his colonoscopy to Dec 21st.

## 2019-10-02 ENCOUNTER — Other Ambulatory Visit: Payer: BC Managed Care – PPO | Attending: Gastroenterology

## 2019-10-05 ENCOUNTER — Other Ambulatory Visit: Payer: Self-pay

## 2019-10-06 ENCOUNTER — Inpatient Hospital Stay: Payer: BC Managed Care – PPO | Attending: Oncology

## 2019-10-06 ENCOUNTER — Other Ambulatory Visit: Payer: Self-pay

## 2019-10-06 VITALS — BP 106/67 | HR 60 | Temp 96.0°F | Resp 18

## 2019-10-06 DIAGNOSIS — D509 Iron deficiency anemia, unspecified: Secondary | ICD-10-CM | POA: Diagnosis present

## 2019-10-06 MED ORDER — SODIUM CHLORIDE 0.9 % IV SOLN
INTRAVENOUS | Status: DC
  Administered 2019-10-06: 14:00:00 via INTRAVENOUS
  Filled 2019-10-06: qty 250

## 2019-10-06 MED ORDER — SODIUM CHLORIDE 0.9 % IV SOLN
510.0000 mg | Freq: Once | INTRAVENOUS | Status: AC
  Administered 2019-10-06: 510 mg via INTRAVENOUS
  Filled 2019-10-06: qty 17

## 2019-10-06 NOTE — H&P (View-Only) (Signed)
Pt tolerated infusion well, Pt declines to stay for full 30 minute post observation/ Pt and VS stable at discharge.

## 2019-10-06 NOTE — Progress Notes (Signed)
Pt tolerated infusion well, Pt declines to stay for full 30 minute post observation/ Pt and VS stable at discharge.

## 2019-10-21 ENCOUNTER — Other Ambulatory Visit: Payer: Self-pay

## 2019-10-21 ENCOUNTER — Encounter: Payer: Self-pay | Admitting: Gastroenterology

## 2019-10-22 ENCOUNTER — Other Ambulatory Visit
Admission: RE | Admit: 2019-10-22 | Discharge: 2019-10-22 | Disposition: A | Discharge status: Home / Self Care | Payer: BC Managed Care – PPO | Source: Ambulatory Visit | Attending: Gastroenterology | Admitting: Gastroenterology

## 2019-10-22 DIAGNOSIS — Z20828 Contact with and (suspected) exposure to other viral communicable diseases: Secondary | ICD-10-CM | POA: Diagnosis not present

## 2019-10-22 DIAGNOSIS — Z01812 Encounter for preprocedural laboratory examination: Secondary | ICD-10-CM | POA: Diagnosis present

## 2019-10-22 LAB — SARS CORONAVIRUS 2 (TAT 6-24 HRS): SARS Coronavirus 2: NEGATIVE

## 2019-10-26 ENCOUNTER — Ambulatory Visit: Payer: BC Managed Care – PPO | Admitting: Anesthesiology

## 2019-10-26 ENCOUNTER — Encounter: Payer: Self-pay | Admitting: Gastroenterology

## 2019-10-26 ENCOUNTER — Ambulatory Visit
Admission: RE | Admit: 2019-10-26 | Discharge: 2019-10-26 | Disposition: A | Discharge status: Home / Self Care | Payer: BC Managed Care – PPO | Attending: Gastroenterology | Admitting: Gastroenterology

## 2019-10-26 ENCOUNTER — Ambulatory Visit
Admission: RE | Disposition: A | Discharge status: Home / Self Care | Payer: Self-pay | Source: Home / Self Care | Attending: Gastroenterology

## 2019-10-26 ENCOUNTER — Other Ambulatory Visit: Payer: Self-pay

## 2019-10-26 DIAGNOSIS — C9000 Multiple myeloma not having achieved remission: Secondary | ICD-10-CM | POA: Diagnosis not present

## 2019-10-26 DIAGNOSIS — I482 Chronic atrial fibrillation, unspecified: Secondary | ICD-10-CM | POA: Diagnosis not present

## 2019-10-26 DIAGNOSIS — C184 Malignant neoplasm of transverse colon: Secondary | ICD-10-CM

## 2019-10-26 DIAGNOSIS — M1990 Unspecified osteoarthritis, unspecified site: Secondary | ICD-10-CM | POA: Insufficient documentation

## 2019-10-26 DIAGNOSIS — K2950 Unspecified chronic gastritis without bleeding: Secondary | ICD-10-CM | POA: Insufficient documentation

## 2019-10-26 DIAGNOSIS — D500 Iron deficiency anemia secondary to blood loss (chronic): Secondary | ICD-10-CM | POA: Insufficient documentation

## 2019-10-26 DIAGNOSIS — E7800 Pure hypercholesterolemia, unspecified: Secondary | ICD-10-CM | POA: Diagnosis not present

## 2019-10-26 DIAGNOSIS — K5669 Other partial intestinal obstruction: Secondary | ICD-10-CM

## 2019-10-26 DIAGNOSIS — C186 Malignant neoplasm of descending colon: Secondary | ICD-10-CM | POA: Diagnosis not present

## 2019-10-26 DIAGNOSIS — I10 Essential (primary) hypertension: Secondary | ICD-10-CM | POA: Insufficient documentation

## 2019-10-26 DIAGNOSIS — E119 Type 2 diabetes mellitus without complications: Secondary | ICD-10-CM | POA: Diagnosis not present

## 2019-10-26 DIAGNOSIS — K253 Acute gastric ulcer without hemorrhage or perforation: Secondary | ICD-10-CM

## 2019-10-26 DIAGNOSIS — K259 Gastric ulcer, unspecified as acute or chronic, without hemorrhage or perforation: Secondary | ICD-10-CM | POA: Diagnosis not present

## 2019-10-26 DIAGNOSIS — D509 Iron deficiency anemia, unspecified: Secondary | ICD-10-CM | POA: Diagnosis present

## 2019-10-26 DIAGNOSIS — G4733 Obstructive sleep apnea (adult) (pediatric): Secondary | ICD-10-CM | POA: Diagnosis not present

## 2019-10-26 DIAGNOSIS — D490 Neoplasm of unspecified behavior of digestive system: Secondary | ICD-10-CM

## 2019-10-26 DIAGNOSIS — K9181 Other intraoperative complications of digestive system: Secondary | ICD-10-CM

## 2019-10-26 HISTORY — PX: ESOPHAGOGASTRODUODENOSCOPY (EGD) WITH PROPOFOL: SHX5813

## 2019-10-26 HISTORY — DX: Unspecified osteoarthritis, unspecified site: M19.90

## 2019-10-26 HISTORY — DX: Sleep apnea, unspecified: G47.30

## 2019-10-26 HISTORY — DX: Malignant neoplasm of transverse colon: C18.4

## 2019-10-26 HISTORY — DX: Anemia, unspecified: D64.9

## 2019-10-26 HISTORY — PX: COLONOSCOPY WITH PROPOFOL: SHX5780

## 2019-10-26 SURGERY — COLONOSCOPY WITH PROPOFOL
Anesthesia: General | Site: Rectum

## 2019-10-26 MED ORDER — PROPOFOL 10 MG/ML IV BOLUS
INTRAVENOUS | Status: DC | PRN
  Administered 2019-10-26 (×2): 100 mg via INTRAVENOUS
  Administered 2019-10-26 (×2): 50 mg via INTRAVENOUS

## 2019-10-26 MED ORDER — STERILE WATER FOR IRRIGATION IR SOLN
Status: DC | PRN
  Administered 2019-10-26: 50 mL

## 2019-10-26 MED ORDER — LACTATED RINGERS IV SOLN: INTRAVENOUS | Status: DC

## 2019-10-26 MED ORDER — LIDOCAINE HCL (CARDIAC) PF 100 MG/5ML IV SOSY
PREFILLED_SYRINGE | INTRAVENOUS | Status: DC | PRN
  Administered 2019-10-26: 50 mg via INTRAVENOUS

## 2019-10-26 SURGICAL SUPPLY — 11 items
BLOCK BITE 60FR ADLT L/F GRN (MISCELLANEOUS) ×4 IMPLANT
CANISTER SUCT 1200ML W/VALVE (MISCELLANEOUS) ×4 IMPLANT
FORCEPS BIOP RAD 4 LRG CAP 4 (CUTTING FORCEPS) ×4 IMPLANT
GOWN CVR UNV OPN BCK APRN NK (MISCELLANEOUS) ×4 IMPLANT
GOWN ISOL THUMB LOOP REG UNIV (MISCELLANEOUS) ×4
INJECTOR VARIJECT VIN23 (MISCELLANEOUS) ×2 IMPLANT
KIT ENDO PROCEDURE OLY (KITS) ×4 IMPLANT
MARKER SPOT ENDO TATTOO 5ML (MISCELLANEOUS) ×2 IMPLANT
SPOT EX ENDOSCOPIC TATTOO (MISCELLANEOUS) ×2
VARIJECT INJECTOR VIN23 (MISCELLANEOUS) ×4
WATER STERILE IRR 250ML POUR (IV SOLUTION) ×4 IMPLANT

## 2019-10-26 NOTE — Op Note (Signed)
[facility] Gastroenterology Patient Name: Rodny Kosak Procedure Date: 10/26/2019 9:49 AM MRN: [id number] Account #: [id number] Date of Birth: Jun 23, 1956 Admit Type: Outpatient Age: 63 Room: [facility] OR ROOM 01 Gender: Male Note Status: Finalized Procedure:             Colonoscopy Indications:           Iron deficiency anemia Providers:             Lucilla Lame MD, MD Medicines:             Propofol per Anesthesia Complications:         No immediate complications. Procedure:             Pre-Anesthesia Assessment:                        - Prior to the procedure, a History and Physical was                         performed, and patient medications and allergies were                         reviewed. The patient's tolerance of previous                         anesthesia was also reviewed. The risks and benefits                         of the procedure and the sedation options and risks                         were discussed with the patient. All questions were                         answered, and informed consent was obtained. Prior                         Anticoagulants: The patient has taken no previous                         anticoagulant or antiplatelet agents. ASA Grade                         Assessment: II - A patient with mild systemic disease.                         After reviewing the risks and benefits, the patient                         was deemed in satisfactory condition to undergo the                         procedure.                        After obtaining informed consent, the colonoscope was                         passed under direct vision. Throughout the procedure,  the patient's blood pressure, pulse, and oxygen                         saturations were monitored continuously. The                         Colonoscope was introduced through the anus and                         advanced to the the descending colon to examine a                          mass. This was the intended extent. The colonoscopy                         was performed without difficulty. The patient                         tolerated the procedure well. The quality of the bowel                         preparation was excellent. Findings:      The perianal and digital rectal examinations were normal.      A partially obstructing large mass was found in the descending colon.       The mass was circumferential. Oozing was present. This was biopsied with       a cold forceps for histology. Area was successfully injected with 4 mL       N[place] ink for tattooing. 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 Biopsied. Injected. Recommendation:        - Discharge patient to home.                        - Resume previous diet.                        - Continue present medications.                        - Await pathology results.                        - Refer to a Psychologist, sport and exercise. Procedure Code(s):     --- Professional ---                        [phone], 52, Colonoscopy, flexible; with directed                         submucosal injection(s), any substance                        [id number], 13, Colonoscopy, flexible; with biopsy, single                         or multiple Diagnosis Code(s):     --- Professional ---  D50.9, Iron deficiency anemia, unspecified                        D49.0, Neoplasm of unspecified behavior of digestive                         system                        K56.690, Other partial intestinal obstruction CPT copyright 2019 American Medical Association. All rights reserved. The codes documented in this report are preliminary and upon coder review may  be revised to meet current compliance requirements. Lucilla Lame MD, MD 10/26/2019 10:15:22 AM This report has been signed electronically. Number of Addenda: 0 Note Initiated On: 10/26/2019  9:49 AM Total Procedure Duration: 0 hours 9 minutes 19 seconds  Estimated Blood Loss:  Estimated blood loss: none.      [facility]

## 2019-10-26 NOTE — Anesthesia Postprocedure Evaluation (Signed)
Anesthesia Post Note  Patient: Chris George  Procedure(s) Performed: COLONOSCOPY WITH BIOPSY (N/A Rectum) ESOPHAGOGASTRODUODENOSCOPY (EGD) WITH BIOPSY (N/A Mouth)     Anesthesia Post Evaluation  Mariah Gerstenberger

## 2019-10-26 NOTE — Anesthesia Preprocedure Evaluation (Signed)
Anesthesia Evaluation  Patient identified by MRN, date of birth, ID band Patient awake    Reviewed: Allergy & Precautions, NPO status , Patient's Chart, lab work & pertinent test results  Airway Mallampati: II  TM Distance: >3 FB Neck ROM: Full    Dental no notable dental hx.    Pulmonary sleep apnea ,    Pulmonary exam normal        Cardiovascular hypertension, Normal cardiovascular exam+ dysrhythmias Atrial Fibrillation      Neuro/Psych negative neurological ROS  negative psych ROS   GI/Hepatic negative GI ROS, Neg liver ROS,   Endo/Other  diabetes, Type 2  Renal/GU negative Renal ROS  negative genitourinary   Musculoskeletal  (+) Arthritis ,   Abdominal (+) + obese,   Peds  Hematology  (+) anemia ,   Anesthesia Other Findings Multiple myeloma  Reproductive/Obstetrics negative OB ROS                             Anesthesia Physical Anesthesia Plan  ASA: II  Anesthesia Plan: General   Post-op Pain Management:    Induction: Intravenous  PONV Risk Score and Plan: 2 and Ondansetron and TIVA  Airway Management Planned: Nasal Cannula  Additional Equipment: None  Intra-op Plan:   Post-operative Plan:   Informed Consent: I have reviewed the patients History and Physical, chart, labs and discussed the procedure including the risks, benefits and alternatives for the proposed anesthesia with the patient or authorized representative who has indicated his/her understanding and acceptance.     Dental advisory given  Plan Discussed with: CRNA  Anesthesia Plan Comments:         Anesthesia Quick Evaluation

## 2019-10-26 NOTE — Transfer of Care (Signed)
Immediate Anesthesia Transfer of Care Note  Patient: Chris George  Procedure(s) Performed: COLONOSCOPY WITH BIOPSY (N/A Rectum) ESOPHAGOGASTRODUODENOSCOPY (EGD) WITH BIOPSY (N/A Mouth)  Patient Location: PACU  Anesthesia Type: General  Level of Consciousness: awake, alert  and patient cooperative  Airway and Oxygen Therapy: Patient Spontanous Breathing and Patient connected to supplemental oxygen  Post-op Assessment: Post-op Vital signs reviewed, Patient's Cardiovascular Status Stable, Respiratory Function Stable, Patent Airway and No signs of Nausea or vomiting  Post-op Vital Signs: Reviewed and stable  Complications: No apparent anesthesia complications

## 2019-10-26 NOTE — Interval H&P Note (Signed)
History and Physical Interval Note:  10/26/2019 9:26 AM  Chris George  has presented today for surgery, with the diagnosis of Iron deficiency anemia D50.0.  The various methods of treatment have been discussed with the patient and family. After consideration of risks, benefits and other options for treatment, the patient has consented to  Procedure(s) with comments: COLONOSCOPY WITH PROPOFOL (N/A) ESOPHAGOGASTRODUODENOSCOPY (EGD) WITH PROPOFOL (N/A) - sleep apnea as a surgical intervention.  The patient's history has been reviewed, patient examined, no change in status, stable for surgery.  I have reviewed the patient's chart and labs.  Questions were answered to the patient's satisfaction.     [employer]

## 2019-10-26 NOTE — Op Note (Signed)
[facility] Gastroenterology Patient Name: Chris George Procedure Date: 10/26/2019 9:49 AM MRN: [id number] Account #: [id number] Date of Birth: [birth year] Admit Type: Outpatient Age: 63 Room: [facility] OR ROOM 01 Gender: Male Note Status: Finalized Procedure:             Upper GI endoscopy Indications:           Iron deficiency anemia secondary to chronic blood loss Providers:             Lucilla Lame MD, MD Referring MD:          Sofie Hartigan (Referring MD) Medicines:             Propofol per Anesthesia Complications:         No immediate complications. Procedure:             Pre-Anesthesia Assessment:                        - Prior to the procedure, a History and Physical was                         performed, and patient medications and allergies were                         reviewed. The patient's tolerance of previous                         anesthesia was also reviewed. The risks and benefits                         of the procedure and the sedation options and risks                         were discussed with the patient. All questions were                         answered, and informed consent was obtained. Prior                         Anticoagulants: The patient has taken no previous                         anticoagulant or antiplatelet agents. ASA Grade                         Assessment: II - A patient with mild systemic disease.                         After reviewing the risks and benefits, the patient                         was deemed in satisfactory condition to undergo the                         procedure.                        After obtaining informed consent, the endoscope was  passed under direct vision. Throughout the procedure,                         the patient's blood pressure, pulse, and oxygen                         saturations were monitored continuously. The                         Endosonoscope was  introduced through the mouth, and                         advanced to the second part of duodenum. The upper GI                         endoscopy was accomplished without difficulty. The                         patient tolerated the procedure well. Findings:      The examined esophagus was normal.      Few non-bleeding superficial gastric ulcers with no stigmata of bleeding       were found in the gastric antrum. Biopsies were taken with a cold       forceps for histology.      The examined duodenum was normal. Impression:            - Normal esophagus.                        - Non-bleeding gastric ulcers with no stigmata of                         bleeding. Biopsied.                        - Normal examined duodenum. Recommendation:        - Discharge patient to home.                        - Resume previous diet.                        - Continue present medications.                        - Await pathology results.                        - Perform a colonoscopy tomorrow. Procedure Code(s):     --- Professional ---                        [phone], Esophagogastroduodenoscopy, flexible,                         transoral; with biopsy, single or multiple Diagnosis Code(s):     --- Professional ---                        D50.0, Iron deficiency anemia secondary to blood loss                         (  chronic)                        K25.9, Gastric ulcer, unspecified as acute or chronic,                         without hemorrhage or perforation CPT copyright 2019 American Medical Association. All rights reserved. The codes documented in this report are preliminary and upon coder review may  be revised to meet current compliance requirements. Lucilla Lame MD, MD 10/26/2019 10:02:39 AM This report has been signed electronically. Number of Addenda: 0 Note Initiated On: 10/26/2019 9:49 AM Total Procedure Duration: 0 hours 3 minutes 18 seconds  Estimated Blood Loss:  Estimated blood loss: none.       [facility]

## 2019-10-27 ENCOUNTER — Ambulatory Visit (INDEPENDENT_AMBULATORY_CARE_PROVIDER_SITE_OTHER): Payer: BC Managed Care – PPO | Admitting: Surgery

## 2019-10-27 ENCOUNTER — Encounter: Payer: Self-pay | Admitting: *Deleted

## 2019-10-27 ENCOUNTER — Telehealth: Payer: Self-pay | Admitting: Emergency Medicine

## 2019-10-27 ENCOUNTER — Telehealth: Payer: Self-pay

## 2019-10-27 VITALS — BP 135/84 | HR 71 | Temp 98.1°F | Ht 69.0 in | Wt 223.8 lb

## 2019-10-27 DIAGNOSIS — D490 Neoplasm of unspecified behavior of digestive system: Secondary | ICD-10-CM

## 2019-10-27 DIAGNOSIS — C186 Malignant neoplasm of descending colon: Secondary | ICD-10-CM

## 2019-10-27 DIAGNOSIS — C184 Malignant neoplasm of transverse colon: Secondary | ICD-10-CM | POA: Insufficient documentation

## 2019-10-27 NOTE — Telephone Encounter (Signed)
Cardiac Clearance faxed over to Dr.Bruce J. Nehemiah Massed 10/27/19.  Medical Clearance faxed over to Dr. Thereasa Distance 10/27/19.

## 2019-10-27 NOTE — Progress Notes (Signed)
10/27/2019  Reason for Visit:  Newly diagnosed colon mass  Referring Provider:  Lucilla Lame, MD  History of Present Illness: Chris George is a 63 y.o. male presenting with newly diagnosed mass of the descending colon, likely cancer.  The patient has a history of multiple myeloma and atrial fibrillation.  He was hospitalized in July 2020 due to low hemoglobin of 5.6 in association with extreme fatigue.  He denied any unintentional weight loss or changes in his stool or blood in his stool.  Given his multiple myeloma diagnosis, he received blood transfusions and had further workup of his myeloma as the potential etiology of his anemia.  He has received iron infusions as well and overall now feels a lot better, with a recent Hemoglobin of 10.1 on 09/09/19.  As part of his further workup, he was referred to GI for colonoscopy, as he had never had one.  Colonoscopy was done on 12/21 and was found to have a large, partially obstructing mass of the descending colon.  Biopsy was obtained and it is currently pending.  He overall is very active and play tennis and has been trying to get in better shape.  He has lost weight overall, but has been through diet and exercise rather than unintentional.  Denies any blood in the stool.  He takes some herbal supplements for his gout and takes Lasix high dose for his heart failure and afib.  Has not had any abdominal surgeries.  Past Medical History: Past Medical History:  Diagnosis Date  . A-fib ([place])   . Anemia   . Arthritis    ankles  . Cancer ([place])    multiple myeloma  . Dysrhythmia    atrial fib  . Hypertension   . Sleep apnea    No CPAP     Past Surgical History: Past Surgical History:  Procedure Laterality Date  . CARDIAC SURGERY     A-Fib Ablations  . COLONOSCOPY WITH PROPOFOL N/A 10/26/2019   Procedure: COLONOSCOPY WITH BIOPSY;  Surgeon: Lucilla Lame, MD;  Location: [facility];  Service: Endoscopy;  Laterality: N/A;  .  ESOPHAGOGASTRODUODENOSCOPY (EGD) WITH PROPOFOL N/A 10/26/2019   Procedure: ESOPHAGOGASTRODUODENOSCOPY (EGD) WITH BIOPSY;  Surgeon: Lucilla Lame, MD;  Location: [facility];  Service: Endoscopy;  Laterality: N/A;  sleep apnea    Home Medications: Prior to Admission medications   Medication Sig Start Date End Date Taking? Authorizing Provider  APPLE CIDER VINEGAR PO Take by mouth.   Yes [provider]  colchicine 0.6 MG tablet Take by mouth. 07/18/18  Yes [provider]  digoxin (DIGOX) 0.25 MG tablet Take 0.25 mg by mouth daily.    Yes [provider]  docusate sodium (COLACE) 100 MG capsule Take 1 capsule (100 mg total) by mouth 2 (two) times daily. 05/13/19 05/12/20 Yes Ojie, Jude, MD  ferrous sulfate 325 (65 FE) MG EC tablet Take 1 tablet (325 mg total) by mouth 2 (two) times daily. 05/13/19 05/12/20 Yes Ojie, Jude, MD  furosemide (LASIX) 80 MG tablet Take 80 mg by mouth 2 (two) times daily.    Yes [provider]  potassium bicarbonate (KLOR-CON/EF) 25 MEQ disintegrating tablet Take 25 mEq by mouth daily.    Yes [provider]    Allergies: No Known Allergies  Social History:  reports that he has never smoked. He has never used smokeless tobacco. He reports previous alcohol use. He reports that he does not use drugs.   Family History: Family History  Problem Relation Age of Onset  . Healthy Mother     Review of Systems: Review of Systems  Constitutional: Negative for chills and fever.  HENT: Negative for hearing loss.   Respiratory: Negative for shortness of breath.   Cardiovascular: Negative for chest pain.  Gastrointestinal: Negative for abdominal pain, blood in stool, constipation, diarrhea, nausea and vomiting.  Genitourinary: Negative for dysuria.  Musculoskeletal: Negative for myalgias.  Skin: Negative for rash.  Neurological: Negative for dizziness.  Psychiatric/Behavioral: Negative for depression.    Physical Exam BP  135/84   Pulse 71   Temp 98.1 F (36.7 C) (Temporal)   Ht '5\' 9"'$  (1.753 m)   Wt 101.5 kg   SpO2 94%   BMI 33.05 kg/m  CONSTITUTIONAL: No acute distress HEENT:  Normocephalic, atraumatic, extraocular motion intact. NECK: Trachea is midline, and there is no jugular venous distension.  RESPIRATORY:  Lungs are clear, and breath sounds are equal bilaterally. Normal respiratory effort without pathologic use of accessory muscles. CARDIOVASCULAR: Heart is regular without murmurs, gallops, or rubs. GI: The abdomen is soft, obese, non-distended, non-tender to palpation. There were no palpable masses.  MUSCULOSKELETAL:  Normal muscle strength and tone in all four extremities.  No peripheral edema or cyanosis. SKIN: Skin turgor is normal. There are no pathologic skin lesions.  NEUROLOGIC:  Motor and sensation is grossly normal.  Cranial nerves are grossly intact. PSYCH:  Alert and oriented to person, place and time. Affect is normal.  Laboratory Analysis: Labs 09/09/19: Na 137, K 3.1, Cl 103, CO2 26, BUN 19, Cr 1.55.  Iron 35, TIBC 357, Ferritin 11.  WBC 3.8, Hgb 10.1, HCT 32.2, Plt 168.  Imaging: Colonoscopy 10/26/19: - Likely malignant partially obstructing tumor in the descending colon. Biopsied. Injected.   EGD 10/26/19: - Normal esophagus. - Non-bleeding gastric ulcers with no stigmata of bleeding. Biopsied. - Normal examined duodenum.  Assessment and Plan: This is a 63 y.o. male with newly diagnosed descending colon mass.  --Discussed with the patient that although the biopsy result is still pending, this is most likely a malignant mass.  At this point, the next step in the workup would be staging imaging studies.  We will order CT scans of chest, abdomen, and pelvis with contrast to evaluate for any potential metastatic disease.  Discussed with him that pending the results, we will tentatively schedule him for now for a laparoscopic left colectomy for 11/10/19.  If the results show  metastatic disease, he may need neoadjuvant chemotherapy vs referral to tertiary center for concurrent left colectomy and metastasectomy, but this would depend on the extent if any. --Discussed with him that if the CT scans are negative for any metastatic disease, we can proceed with laparoscopic left colectomy.  Discussed with him the risks of bleeding, infection, injury to surrounding structures, the possibility of converting to an open surgery, the possibility of needing either an end colostomy or a diverting loop ileostomy.  He is willing to proceed. --He understands that he will need to get tested for COVID prior to surgery as well as do a bowel prep.  We will also send clearance forms to his PCP and cardiologist.  He will need to stop his ASA 5 days prior to surgery. --CT scans are ordered for 11/03/19.  I will call the patient with the results. --Given that this is a partially obstructing colon cancer, we cannot wait until the moratorium on elective cases is ended to schedule this case.     Face-to-face time  spent with the patient and care providers was 60 minutes, with more than 50% of the time spent counseling, educating, and coordinating care of the patient.     Melvyn Neth, [facility]

## 2019-10-27 NOTE — Patient Instructions (Addendum)
Patient has been scheduled for a CT of chest and CT abdomen/pelvis with contrast at [facility] for 11/03/19 at 10:30am (arrive at 10:15am). Prep: NPO 4 hours prior and pick up prep kit. Patient may arrive a few minutes earlier to have labs drawn before procedure.  Please have the BLUE sheet available when our surgery scheduler Levada Dy contacts you within the next 24-48 hrs. Levada Dy will discuss with patient the surgery date and the prep for the surgery. If you have any questions or concerns, please feel free to contact our office.    Multiple Myeloma  Multiple myeloma is a form of cancer. It develops when abnormal plasma cells grow out of control. Plasma cells are a type of white blood cell that is made in the soft tissue inside the bones (bone marrow). They are part of the body's disease-fighting system (immune system). Multiple myeloma damages bones and causes other health problems because of its effect on blood cells. Abnormal plasma cells produce monoclonal proteins (M proteins) and interfere with many important functions that normal cells perform in the body. The disease gets worse over time (progresses) and reduces the body's ability to fight infections. What are the causes? The cause of multiple myeloma is not known. What increases the risk? You are more likely to develop this condition if you:  Are older than age 34.  Are male.  Are African American.  Have a family history of multiple myeloma.  Have a history of monoclonal gammopathy of undetermined significance (MGUS).  Have a history of radiation exposure.  Have been exposed to certain chemicals, such as benzene or pesticides. What are the signs or symptoms? Signs and symptoms of multiple myeloma may include:  Bone pain, especially in the back, ribs, and hips.  Broken bones (fractures).  Having a low level of red blood cells (anemia), white blood cells (leukopenia), and platelets (thrombocytopenia). Platelets are  cells that help blood to clot so a wound does not keep bleeding.  Fatigue.  Weakness.  Infections.  Unusual bleeding, such as: ? Bleeding from the nose or gums. ? Bleeding a lot from a small scrape or cut.  High blood calcium levels.  Increased urination.  Confusion.  Shortness of breath.  Weakness or numbness in your legs.  Sudden, severe back pain. How is this diagnosed? This condition is diagnosed based on your symptoms, your medical history, and a physical exam. You will have blood and urine tests to confirm that M proteins are present. You may also have other tests, including:  Additional blood tests.  X-rays.  MRI.  CT scan.  PET scan.  Tests to check the function of your kidneys.  Heart tests, such as an echocardiogram. An echocardiogram uses sound waves to produce an image of the heart.  A procedure to remove a sample of bone marrow (bone marrow biopsy). The sample is examined for abnormal plasma cells. How is this treated? There is no cure for multiple myeloma. However, treatments can manage symptoms and slow the progression of the disease. Treatment options may vary depending on how much the disease has advanced. Possible treatment options may include:  Medicines that kill cancer cells (chemotherapy).  Radiation therapy. This is the use of high-energy rays to kill cancer cells.  A bone marrow transplant. This procedure replaces diseased bone marrow with healthy bone marrow (stem cell transplant).  Medicines that block the growth and spread of cancer cells (targeted drug therapy).  Medicines that strengthen your immune system's ability to fight cancer  cells (immunotherapy or biologic therapy).  Participating in clinical trials to find out if new (experimental) treatments are effective.  Medicines that help to prevent bone damage (bisphosphonates).  Medicines that reduce swelling (corticosteroids).  Surgery to repair bone damage.  A procedure to  remove plasma cells from your blood (plasmapheresis).  Other medicines to treat problems such as infections or pain. Follow these instructions at home:  Eating and drinking  Drink enough fluid to keep your urine pale yellow.  Try to eat healthy meals on a regular basis. Some of your treatments might affect your appetite. If you are having problems eating or if you do not have an appetite, meet with a diet and nutrition specialist (dietitian).  Take vitamins or supplements only as told by your health care provider or dietitian. Some vitamins and supplements may interfere with how well your treatment works. General instructions  Take over-the-counter and prescription medicines only as told by your health care provider.  Stay active. Talk with your health care provider about what types of exercises and activities are safe for you. ? Avoid activities that cause increased pain. ? Do not lift anything that is heavier than 10 lb (4.5 kg), or the limit that you are told, until your health care provider says that it is safe.  Consider joining a support group or getting counseling to help you cope with the stress of having multiple myeloma.  Keep all follow-up visits as told by your health care provider. This is important. Where to find more information  American Cancer Society: www.cancer.org  Leukemia and Lymphoma Society: www.LLS.[place] www.cancer.gov Contact a health care provider if you:  Have pain that gets worse or does not get better with medicine.  Have a fever.  Have swollen legs.  Have weakness or dizziness.  Have unexplained weight loss.  Have unexplained bleeding or bruising.  Have a cough or symptoms of the common cold.  Feel depressed.  Have changes in urination or bowel movements. Get help right away if you:  Have sudden severe pain, especially back pain.  Have numbness or weakness in your arms, hands, legs, or feet.  Become  very confused.  Have weakness on one side of your body.  Have slurred speech.  Have trouble staying awake.  Have shortness of breath.  Have blood in your stool (feces) or urine.  Vomit blood or cough up blood. Summary  Multiple myeloma is a form of cancer. It develops when abnormal plasma cells grow out of control.  There is no cure for multiple myeloma. However, treatments can manage symptoms and slow the progression of the disease. Treatment options may vary depending on how much the disease has advanced.  Do not lift anything that is heavier than 10 lb (4.5 kg), or the limit that you are told, until your health care provider says that it is safe.  Contact your health care provider if you have any new symptoms or sudden severe pain, especially back pain. This information is not intended to replace advice given to you by your health care provider. Make sure you discuss any questions you have with your health care provider. Document Released: 07/17/2001 Document Revised: 10/04/2017 Document Reviewed: 08/21/2017 Elsevier Patient Education  2020 [employer].

## 2019-10-27 NOTE — Telephone Encounter (Signed)
Attempted to call patient to explain why we were wanting to set up appt for him to see Dr. Grayland Ormond. No answer and voicemail box is full.

## 2019-10-28 ENCOUNTER — Encounter: Payer: Self-pay | Admitting: Surgery

## 2019-10-28 ENCOUNTER — Inpatient Hospital Stay: Payer: BC Managed Care – PPO | Admitting: Oncology

## 2019-10-29 ENCOUNTER — Other Ambulatory Visit: Payer: Self-pay | Admitting: Anatomic Pathology & Clinical Pathology

## 2019-10-29 LAB — SURGICAL PATHOLOGY

## 2019-11-02 ENCOUNTER — Other Ambulatory Visit: Payer: Self-pay

## 2019-11-02 ENCOUNTER — Telehealth: Payer: Self-pay

## 2019-11-02 DIAGNOSIS — D490 Neoplasm of unspecified behavior of digestive system: Secondary | ICD-10-CM

## 2019-11-02 NOTE — Telephone Encounter (Signed)
Medical Clearance was received 11/02/19. Patient is at low risk for surgery.

## 2019-11-03 ENCOUNTER — Other Ambulatory Visit: Payer: Self-pay

## 2019-11-03 ENCOUNTER — Telehealth: Payer: Self-pay | Admitting: Surgery

## 2019-11-03 ENCOUNTER — Ambulatory Visit: Admission: RE | Admit: 2019-11-03 | Payer: BC Managed Care – PPO | Source: Ambulatory Visit

## 2019-11-03 ENCOUNTER — Telehealth: Payer: Self-pay | Admitting: Emergency Medicine

## 2019-11-03 MED ORDER — ERYTHROMYCIN BASE 500 MG PO TABS: ORAL_TABLET | ORAL | 0 refills | Status: DC

## 2019-11-03 MED ORDER — NEOMYCIN SULFATE 500 MG PO TABS: ORAL_TABLET | ORAL | 0 refills | Status: DC

## 2019-11-03 MED ORDER — BISACODYL 5 MG PO TBEC: DELAYED_RELEASE_TABLET | ORAL | 0 refills | Status: DC

## 2019-11-03 MED ORDER — POLYETHYLENE GLYCOL 3350 17 GM/SCOOP PO POWD: ORAL | 0 refills | Status: DC

## 2019-11-03 NOTE — Telephone Encounter (Signed)
Pt has been advised of pre admission date/time, Covid Testing date and Surgery date. Patient has requested that I mail him the information. I informed patient to please call the office with any additional questions.   Surgery Date: 11/17/19 with [facility]-laparoscopic left colectomy poss open, poss ostomy.  Preadmission Testing Date: 11/11/19 between 1-5:00pm-phone interview.  Covid Testing Date: 11/13/19 between 8-10:30am - patient advised to go to the [facility]  [employer] Video sent via [employer] and [employer].  Patient has been made aware to call [phone], between 1-3:00pm the day before surgery, to find out what time to arrive.     Bowel Prep information mailed.

## 2019-11-03 NOTE — Telephone Encounter (Signed)
Called and spoke with patient regarding why we want to schedule an appointment for him to see Dr. Grayland Ormond.

## 2019-11-04 ENCOUNTER — Encounter: Payer: Self-pay | Admitting: Oncology

## 2019-11-04 DIAGNOSIS — R9431 Abnormal electrocardiogram [ECG] [EKG]: Secondary | ICD-10-CM | POA: Insufficient documentation

## 2019-11-05 ENCOUNTER — Other Ambulatory Visit: Payer: Self-pay

## 2019-11-05 ENCOUNTER — Inpatient Hospital Stay (HOSPITAL_BASED_OUTPATIENT_CLINIC_OR_DEPARTMENT_OTHER): Payer: BC Managed Care – PPO | Admitting: Oncology

## 2019-11-05 VITALS — BP 135/64 | HR 60 | Temp 96.4°F | Wt 220.0 lb

## 2019-11-05 DIAGNOSIS — D509 Iron deficiency anemia, unspecified: Secondary | ICD-10-CM | POA: Diagnosis not present

## 2019-11-05 DIAGNOSIS — C9000 Multiple myeloma not having achieved remission: Secondary | ICD-10-CM

## 2019-11-05 DIAGNOSIS — C186 Malignant neoplasm of descending colon: Secondary | ICD-10-CM | POA: Diagnosis not present

## 2019-11-06 NOTE — Progress Notes (Signed)
[place]  [phone] Fax:([phone]) [phone]  ID: Chris George OB: 05/09/1956  MR#: [id number]  KGS#:[id number]  Patient Care Team: Sofie Hartigan, MD as PCP - General (Family Medicine)  CHIEF COMPLAINT: Smoldering myeloma, colon cancer  INTERVAL HISTORY: Patient returns to clinic today for further evaluation and discuss his new diagnosis of colon cancer.  He is anxious, but otherwise feels well.  He continues to remain active.  He does not complain of peripheral neuropathy today.  He has no other neurological complaints. He denies any pain.  He does not complain of weakness or fatigue today.  He has a good appetite and denies weight loss.  He has no chest pain, shortness of breath, cough, or hemoptysis.  He denies any nausea, vomiting, constipation, or diarrhea.  He has no melena or hematochezia.  He has no urinary complaints.  Patient offers no specific complaints today.  REVIEW OF SYSTEMS:   Review of Systems  Constitutional: Negative.  Negative for fever, malaise/fatigue and weight loss.  Respiratory: Negative.  Negative for cough, hemoptysis and shortness of breath.   Cardiovascular: Negative.  Negative for chest pain and leg swelling.  Gastrointestinal: Negative.  Negative for abdominal pain, blood in stool and melena.  Genitourinary: Negative.  Negative for hematuria.  Musculoskeletal: Negative.  Negative for back pain.  Skin: Negative.  Negative for rash.  Neurological: Negative.  Negative for dizziness, focal weakness, weakness and headaches.  Psychiatric/Behavioral: The patient is nervous/anxious.     As per HPI. Otherwise, a complete review of systems is negative.  PAST MEDICAL HISTORY: Past Medical History:  Diagnosis Date  . A-fib ([place])   . Anemia   . Arthritis    ankles  . Cancer ([place])    multiple myeloma  . Dysrhythmia    atrial fib  . Hypertension   . Sleep apnea    No CPAP    PAST SURGICAL HISTORY: Past Surgical History:   Procedure Laterality Date  . CARDIAC SURGERY     A-Fib Ablations  . COLONOSCOPY WITH PROPOFOL N/A 10/26/2019   Procedure: COLONOSCOPY WITH BIOPSY;  Surgeon: Lucilla Lame, MD;  Location: [facility];  Service: Endoscopy;  Laterality: N/A;  . ESOPHAGOGASTRODUODENOSCOPY (EGD) WITH PROPOFOL N/A 10/26/2019   Procedure: ESOPHAGOGASTRODUODENOSCOPY (EGD) WITH BIOPSY;  Surgeon: Lucilla Lame, MD;  Location: [facility];  Service: Endoscopy;  Laterality: N/A;  sleep apnea    FAMILY HISTORY: Family History  Problem Relation Age of Onset  . Healthy Mother     ADVANCED DIRECTIVES (Y/N):  N  HEALTH MAINTENANCE: Social History   Tobacco Use  . Smoking status: Never Smoker  . Smokeless tobacco: Never Used  Substance Use Topics  . Alcohol use: Not Currently  . Drug use: Never     Colonoscopy:  PAP:  Bone density:  Lipid panel:  No Known Allergies  Current Outpatient Medications  Medication Sig Dispense Refill  . APPLE CIDER VINEGAR PO Take 30-45 mLs by mouth every other day.     Marland Kitchen aspirin 325 MG EC tablet Take 325 mg by mouth every other day. In the morning    . bisacodyl (DULCOLAX) 5 MG EC tablet Take all 4 tablets at 8 am the morning prior to your surgery. 4 tablet 0  . digoxin (DIGOX) 0.25 MG tablet Take 0.25 mg by mouth daily.     Marland Kitchen docusate sodium (COLACE) 100 MG capsule Take 1 capsule (100 mg total) by mouth 2 (two) times daily. (Patient not taking: Reported  on 10/28/2019) 60 capsule 0  . erythromycin base (E-MYCIN) 500 MG tablet Take 2 tablets at 8am, 2 tablets at 2pm, and 2 tablets at 8pm the day prior to surgery. 6 tablet 0  . ferrous sulfate 325 (65 FE) MG EC tablet Take 1 tablet (325 mg total) by mouth 2 (two) times daily. (Patient not taking: Reported on 10/28/2019) 60 tablet 0  . furosemide (LASIX) 80 MG tablet Take 80 mg by mouth 2 (two) times daily. Morning & late afternoon    . Misc Natural Products (TART CHERRY ADVANCED PO) Take 60 mLs by mouth every  other day.    . neomycin (MYCIFRADIN) 500 MG tablet Take 2 tablet at 8am, take 2 tablets at 2pm, and take 2 tablets at 8pm the day prior to your surgery 6 tablet 0  . polyethylene glycol powder (MIRALAX) 17 GM/SCOOP powder Mix full container in 64 ounces of Gatorade or other clear liquid. 238 g 0  . potassium bicarbonate (KLOR-CON/EF) 25 MEQ disintegrating tablet Take 25 mEq by mouth 4 (four) times a week. In the afternoon.     No current facility-administered medications for this visit.    OBJECTIVE: Vitals:   11/05/19 0828  BP: 135/64  Pulse: 60  Temp: (!) 96.4 F (35.8 C)     Body mass index is 32.49 kg/m.    ECOG FS:0 - Asymptomatic  General: Well-developed, well-nourished, no acute distress. Eyes: Pink conjunctiva, anicteric sclera. HEENT: Normocephalic, moist mucous membranes. Lungs: No audible wheezing or coughing. Heart: Regular rate and rhythm. Abdomen: Soft, nontender, no obvious distention. Musculoskeletal: No edema, cyanosis, or clubbing. Neuro: Alert, answering all questions appropriately. Cranial nerves grossly intact. Skin: No rashes or petechiae noted. Psych: Normal affect.  LAB RESULTS:  Lab Results  Component Value Date   NA 137 09/09/2019   K 3.1 (L) 09/09/2019   CL 103 09/09/2019   CO2 26 09/09/2019   GLUCOSE 108 (H) 09/09/2019   BUN 19 09/09/2019   CREATININE 1.55 (H) 09/09/2019   CALCIUM 8.3 (L) 09/09/2019   PROT 8.4 (H) 07/24/2012   ALBUMIN 4.0 07/24/2012   AST 32 07/24/2012   ALT 37 07/24/2012   ALKPHOS 129 07/24/2012   BILITOT 0.6 07/24/2012   GFRNONAA 47 (L) 09/09/2019   GFRAA 54 (L) 09/09/2019    Lab Results  Component Value Date   WBC 3.8 (L) 09/09/2019   NEUTROABS 2.3 09/09/2019   HGB 10.1 (L) 09/09/2019   HCT 32.2 (L) 09/09/2019   MCV 81.9 09/09/2019   PLT 168 09/09/2019   Lab Results  Component Value Date   IRON 35 (L) 09/09/2019   TIBC 357 09/09/2019   IRONPCTSAT 10 (L) 09/09/2019   Lab Results  Component Value Date    FERRITIN 11 (L) 09/09/2019     STUDIES: No results found.  ASSESSMENT: Smoldering myeloma, colon cancer  PLAN:    1.  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  He was evaluated in clinic in July 2014 when he declined any maintenance treatment or referral for bone marrow transplant.  He was subsequently lost to follow-up.  His most recent SPEP revealed an M spike of 2.0 with an IgG predominance of 2949.  These are unchanged from 3 months prior.  He also has a suppressed IgA level.  Kappa/lambda light chain ratio has trended up slightly to 2.17.  Both his creatinine and iron deficiency anemia are improving.  He  has no other evidence of endorgan damage. Metastatic bone survey on May 20, 2019 reviewed independently with no obvious bony lesions.  Bone marrow biopsy completed on May 28, 2019 revealed only 10 to 15% plasma cells with no clonality reported.  Cytogenetics were also reported as normal.  No intervention is needed at this time.  Continue to monitor closely. 2.  Iron deficiency anemia: Likely secondary to recently diagnosed colon cancer.  Patient's hemoglobin has improved, but his iron stores remain reduced.  He last received IV Feraheme on October 06, 2019.   3.  Renal insufficiency: Patient's creatinine continues to trend down and is now 1.55. 4.  Peripheral neuropathy: Chronic and unchanged.  Patient does not complain of this today. 5.  Colon cancer: Diagnosed by recent colonoscopy.  Patient has a initial staging CT scheduled for November 11, 2019.  His surgery is scheduled for November 17, 2019.  Patient will return to clinic 2 weeks after his surgery on January 26 to discuss his final pathology results and any treatment planning necessary.  I spent a total of 25 minutes face-to-face with the patient and reviewing chart data of which greater than 50% of the visit was spent in counseling  and coordination of care as detailed above.   Patient expressed understanding and was in agreement with this plan. He also understands that He can call clinic at any time with any questions, concerns, or complaints.    Chris Huger, MD   11/06/2019 8:16 AM

## 2019-11-09 ENCOUNTER — Telehealth: Payer: Self-pay | Admitting: Emergency Medicine

## 2019-11-09 NOTE — Telephone Encounter (Signed)
Cardiac Clearance received from Dr. Serafina Royals 11/05/2019.  "Procedure Risk is Low"

## 2019-11-11 ENCOUNTER — Ambulatory Visit
Admission: RE | Admit: 2019-11-11 | Discharge: 2019-11-11 | Disposition: A | Discharge status: Home / Self Care | Payer: BC Managed Care – PPO | Source: Ambulatory Visit | Attending: Surgery | Admitting: Surgery

## 2019-11-11 ENCOUNTER — Other Ambulatory Visit
Admission: RE | Admit: 2019-11-11 | Discharge: 2019-11-11 | Disposition: A | Discharge status: Home / Self Care | Payer: BC Managed Care – PPO | Source: Home / Self Care | Attending: Surgery | Admitting: Surgery

## 2019-11-11 ENCOUNTER — Other Ambulatory Visit: Payer: Self-pay

## 2019-11-11 ENCOUNTER — Encounter
Admission: RE | Admit: 2019-11-11 | Discharge: 2019-11-11 | Disposition: A | Discharge status: Home / Self Care | Payer: BC Managed Care – PPO | Source: Ambulatory Visit | Attending: Surgery | Admitting: Surgery

## 2019-11-11 DIAGNOSIS — D490 Neoplasm of unspecified behavior of digestive system: Secondary | ICD-10-CM | POA: Diagnosis present

## 2019-11-11 HISTORY — DX: Hyperlipidemia, unspecified: E78.5

## 2019-11-11 HISTORY — DX: Multiple myeloma not having achieved remission: C90.00

## 2019-11-11 HISTORY — DX: Heart failure, unspecified: I50.9

## 2019-11-11 HISTORY — DX: Pure hypercholesterolemia, unspecified: E78.00

## 2019-11-11 HISTORY — DX: Rheumatic tricuspid insufficiency: I07.1

## 2019-11-11 HISTORY — DX: Depression, unspecified: F32.A

## 2019-11-11 HISTORY — DX: Cardiomyopathy, unspecified: I42.9

## 2019-11-11 HISTORY — DX: Pneumonia, unspecified organism: J18.9

## 2019-11-11 LAB — CREATININE, SERUM
Creatinine, Ser: 1.42 mg/dL — ABNORMAL HIGH (ref 0.61–1.24)
GFR calc Af Amer: >60 mL/min (ref >60)
GFR calc non Af Amer: 52 mL/min — ABNORMAL LOW (ref >60)

## 2019-11-11 MED ORDER — IOHEXOL 300 MG/ML  SOLN
150.0000 mL | Freq: Once | INTRAMUSCULAR | Status: AC | PRN
  Administered 2019-11-11: 100 mL via INTRAVENOUS

## 2019-11-11 NOTE — Patient Instructions (Signed)
Your procedure is scheduled on: 11/17/19 Report to [facility]. To find out your arrival time please call [phone] between 1PM - 3PM on 11/16/19.  Remember: Instructions that are not followed completely may result in serious medical risk, up to and including death, or upon the discretion of your surgeon and anesthesiologist your surgery may need to be rescheduled.     _X__ 1. Follow Dr Mont Dutton intructions regarding diet  __X__2.  On the morning of surgery brush your teeth with toothpaste and water, you                 may rinse your mouth with mouthwash if you wish.  Do not swallow any              toothpaste of mouthwash.     _X__ 3.  No Alcohol for 24 hours before or after surgery.   _X__ 4.  Do Not Smoke or use e-cigarettes For 24 Hours Prior to Your Surgery.                 Do not use any chewable tobacco products for at least 6 hours prior to                 surgery.  ____  5.  Bring all medications with you on the day of surgery if instructed.   __X__  6.  Notify your doctor if there is any change in your medical condition      (cold, fever, infections).     Do not wear jewelry, make-up, hairpins, clips or nail polish. Do not wear lotions, powders, or perfumes.  Do not shave 48 hours prior to surgery. Men may shave face and neck. Do not bring valuables to the hospital.    [facility] is not responsible for any belongings or valuables.  Contacts, dentures/partials or body piercings may not be worn into surgery. Bring a case for your contacts, glasses or hearing aids, a denture cup will be supplied. Leave your suitcase in the car. After surgery it may be brought to your room. For patients admitted to the hospital, discharge time is determined by your treatment team.   Patients discharged the day of surgery will not be allowed to drive home.   Please read over the following fact sheets that you were given:   MRSA  Information  __X__ Take these medicines the morning of surgery with A SIP OF WATER:    1. Digoxin  2.   3.   4.  5.  6.  ____ Fleet Enema (as directed)   __X__ Use CHG Soap/SAGE wipes as directed  ____ Use inhalers on the day of surgery  ____ Stop metformin/Janumet/Farxiga 2 days prior to surgery    ____ Take 1/2 of usual insulin dose the night before surgery. No insulin the morning          of surgery.   ____ Stop Blood Thinners Coumadin/Plavix/Xarelto/Pleta/Pradaxa/Eliquis/Effient/Aspirin  on   Or contact your Surgeon, Cardiologist or Medical Doctor regarding  ability to stop your blood thinners  __X__ Stop Anti-inflammatories 7 days before surgery such as Advil, Ibuprofen, Motrin,  BC or Goodies Powder, Naprosyn, Naproxen, Aleve, Aspirin    __X__ Stop all herbal supplements, fish oil or vitamin E until after surgery.    ____ Bring C-Pap to the hospital.    Stop aspirin and tart cherry 5 days before procedure. You will have labs drawn at Covid visit.

## 2019-11-12 NOTE — Progress Notes (Signed)
Called patient this morning to discuss results of his CT scan chest, abdomen, and pelvis.  Patient's colon cancer is located more in the mid-transverse colon, rather than descending colon as initially suspected.  Discussed with him that based on location, we would be doing an extended right colectomy instead.  Discussed that there is no evidence of distant metastasis, but the lymph nodes do appear to be somewhat enlarged.  However, this does not necessarily mean that the cancer has spread to the lymph nodes, and we would remove them as part of the surgery to further evaluate them.  I'm scheduling the patient to come to clinic tomorrow morning for further discussion about the new plan for surgery.  Olean Ree, MD

## 2019-11-13 ENCOUNTER — Ambulatory Visit (INDEPENDENT_AMBULATORY_CARE_PROVIDER_SITE_OTHER): Payer: BC Managed Care – PPO | Admitting: Surgery

## 2019-11-13 ENCOUNTER — Encounter: Payer: Self-pay | Admitting: Surgery

## 2019-11-13 ENCOUNTER — Other Ambulatory Visit: Payer: Self-pay

## 2019-11-13 ENCOUNTER — Other Ambulatory Visit
Admission: RE | Admit: 2019-11-13 | Discharge: 2019-11-13 | Disposition: A | Discharge status: Home / Self Care | Payer: BC Managed Care – PPO | Source: Ambulatory Visit | Attending: Surgery | Admitting: Surgery

## 2019-11-13 VITALS — BP 133/78 | HR 66 | Temp 97.9°F | Resp 14 | Ht 69.0 in | Wt 223.0 lb

## 2019-11-13 DIAGNOSIS — Z01812 Encounter for preprocedural laboratory examination: Secondary | ICD-10-CM | POA: Insufficient documentation

## 2019-11-13 DIAGNOSIS — Z20822 Contact with and (suspected) exposure to covid-19: Secondary | ICD-10-CM | POA: Insufficient documentation

## 2019-11-13 DIAGNOSIS — C184 Malignant neoplasm of transverse colon: Secondary | ICD-10-CM | POA: Diagnosis not present

## 2019-11-13 LAB — CBC WITH DIFFERENTIAL/PLATELET
Abs Immature Granulocytes: 0.01 10*3/uL (ref 0.00–0.07)
Basophils Absolute: 0 10*3/uL (ref 0.0–0.1)
Basophils Relative: 1 %
Eosinophils Absolute: 0.2 10*3/uL (ref 0.0–0.5)
Eosinophils Relative: 6 %
HCT: 37.6 % — ABNORMAL LOW (ref 39.0–52.0)
Hemoglobin: 11.5 g/dL — ABNORMAL LOW (ref 13.0–17.0)
Immature Granulocytes: 0 %
Lymphocytes Relative: 22 %
Lymphs Abs: 0.7 10*3/uL (ref 0.7–4.0)
MCH: 26.9 pg (ref 26.0–34.0)
MCHC: 30.6 g/dL (ref 30.0–36.0)
MCV: 88.1 fL (ref 80.0–100.0)
Monocytes Absolute: 0.4 10*3/uL (ref 0.1–1.0)
Monocytes Relative: 13 %
Neutro Abs: 1.8 10*3/uL (ref 1.7–7.7)
Neutrophils Relative %: 58 %
Platelets: 173 10*3/uL (ref 150–400)
RBC: 4.27 MIL/uL (ref 4.22–5.81)
RDW: 15.9 % — ABNORMAL HIGH (ref 11.5–15.5)
WBC: 3.1 10*3/uL — ABNORMAL LOW (ref 4.0–10.5)
nRBC: 0 % (ref 0.0–0.2)

## 2019-11-13 LAB — COMPREHENSIVE METABOLIC PANEL
ALT: 30 U/L (ref 0–44)
AST: 42 U/L — ABNORMAL HIGH (ref 15–41)
Albumin: 3.5 g/dL (ref 3.5–5.0)
Alkaline Phosphatase: 133 U/L — ABNORMAL HIGH (ref 38–126)
Anion gap: 7 (ref 5–15)
BUN: 18 mg/dL (ref 8–23)
CO2: 29 mmol/L (ref 22–32)
Calcium: 8.5 mg/dL — ABNORMAL LOW (ref 8.9–10.3)
Chloride: 102 mmol/L (ref 98–111)
Creatinine, Ser: 1.49 mg/dL — ABNORMAL HIGH (ref 0.61–1.24)
GFR calc Af Amer: 57 mL/min — ABNORMAL LOW (ref >60)
GFR calc non Af Amer: 49 mL/min — ABNORMAL LOW (ref >60)
Glucose, Bld: 99 mg/dL (ref 70–99)
Potassium: 3.5 mmol/L (ref 3.5–5.1)
Sodium: 138 mmol/L (ref 135–145)
Total Bilirubin: 0.9 mg/dL (ref 0.3–1.2)
Total Protein: 7.8 g/dL (ref 6.5–8.1)

## 2019-11-13 LAB — PROTIME-INR
INR: 1.2 (ref 0.8–1.2)
Prothrombin Time: 14.6 seconds (ref 11.4–15.2)

## 2019-11-13 LAB — SARS CORONAVIRUS 2 (TAT 6-24 HRS): SARS Coronavirus 2: NEGATIVE

## 2019-11-13 LAB — TYPE AND SCREEN
ABO/RH(D): O POS
Antibody Screen: NEGATIVE

## 2019-11-13 NOTE — H&P (View-Only) (Signed)
11/13/2019  History of Present Illness: Chris George is a 64 y.o. male presenting for further discussion of his CT scan prior to surgery scheduled next week.  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  He had a CT chest, abdomen, and pelvis on 1/6 for staging.  This showed the mass to actually be in the mid transverse colon.  There are also some enlarged lymph nodes in the vicinity.  Otherwise, no evidence of distant organ metastasis.  Past Medical History: Past Medical History:  Diagnosis Date  . A-fib ([place])   . Anemia   . Arthritis    ankles  . Cancer ([place])    multiple myeloma  . 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 Cardiomyopathy ([place])   . CHF (congestive heart failure) ([place])   . Depression   . Dysrhythmia    atrial fib  . HLD (hyperlipidemia)   . Hypercholesteremia   . Hypertension   . Moderate tricuspid insufficiency   . Multiple myeloma ([place])    not being treated right now per pt 11/11/19  . Pneumonia   . Sleep apnea    No CPAP     Past Surgical History: Past Surgical History:  Procedure Laterality Date  . ATRIAL ABLATION SURGERY    . CARDIAC SURGERY     A-Fib Ablations  . COLONOSCOPY WITH PROPOFOL N/A 10/26/2019   Procedure: COLONOSCOPY WITH BIOPSY;  Surgeon: Lucilla Lame, MD;  Location: [facility];  Service: Endoscopy;  Laterality: N/A;  . ESOPHAGOGASTRODUODENOSCOPY (EGD) WITH PROPOFOL N/A 10/26/2019   Procedure: ESOPHAGOGASTRODUODENOSCOPY (EGD) WITH BIOPSY;  Surgeon: Lucilla Lame, MD;  Location: [facility];  Service: Endoscopy;  Laterality: N/A;  sleep apnea    Home Medications: Prior to Admission medications   Medication Sig Start Date End Date Taking? Authorizing Provider  APPLE CIDER VINEGAR PO Take 30-45 mLs by mouth every other day.    Yes [provider]  aspirin 325 MG EC tablet Take 325 mg by mouth every other day. In the morning   Yes [provider]  bisacodyl (DULCOLAX)  5 MG EC tablet Take all 4 tablets at 8 am the morning prior to your surgery. 11/03/19  Yes Matalie Romberger, Jacqulyn Bath, MD  digoxin (DIGOX) 0.25 MG tablet Take 0.25 mg by mouth daily.    Yes [provider]  docusate sodium (COLACE) 100 MG capsule Take 1 capsule (100 mg total) by mouth 2 (two) times daily. 05/13/19 05/12/20 Yes Ojie, Jude, MD  erythromycin base (E-MYCIN) 500 MG tablet Take 2 tablets at 8am, 2 tablets at 2pm, and 2 tablets at 8pm the day prior to surgery. 11/03/19  Yes Jamiah Homeyer, Jacqulyn Bath, MD  ferrous sulfate 325 (65 FE) MG EC tablet Take 1 tablet (325 mg total) by mouth 2 (two) times daily. 05/13/19 05/12/20 Yes Ojie, Jude, MD  furosemide (LASIX) 80 MG tablet Take 80 mg by mouth 2 (two) times daily. Morning & late afternoon   Yes [provider]  Misc Natural Products (TART CHERRY ADVANCED PO) Take 60 mLs by mouth every other day.   Yes [provider]  neomycin (MYCIFRADIN) 500 MG tablet Take 2 tablet at 8am, take 2 tablets at 2pm, and take 2 tablets at 8pm the day prior to your surgery 11/03/19  Yes Aradia Estey, MD  polyethylene glycol powder (MIRALAX) 17 GM/SCOOP powder Mix full container in 64 ounces of Gatorade or other clear liquid. 11/03/19  Yes Chasady Longwell, Jacqulyn Bath, MD  potassium bicarbonate (KLOR-CON/EF)  25 MEQ disintegrating tablet Take 25 mEq by mouth 4 (four) times a week. In the afternoon.   Yes [provider]    Allergies: No Known Allergies  Review of Systems: Review of Systems  Constitutional: Negative for chills and fever.  Respiratory: Negative for shortness of breath.   Cardiovascular: Negative for chest pain.  Gastrointestinal: Negative for abdominal pain, nausea and vomiting.    Physical Exam BP 133/78   Pulse 66   Temp 97.9 F (36.6 C)   Resp 14   Ht '5\' 9"'$  (1.753 m)   Wt 101.2 kg   SpO2 97%   BMI 32.93 kg/m  CONSTITUTIONAL: No acute distress Rest of exam deferred, as this appointment was to review CT scan results and discuss surgical  plans.  Labs/Imaging: CT chest, abdomen, and pelvis 11/11/19: IMPRESSION: 1. Large apple-core lesion involving the mid transverse colon consistent with the patient's known colon cancer. No evidence of bowel obstruction or perforation. 2. Prominent lymph nodes in the base of the mesentery adjacent to the colonic lesion, suspicious for nodal metastases. No evidence of distant metastatic disease. 3. Hepatic cirrhosis. Possible early recanalization of the left paraumbilical vein and left internal iliac varices. No other signs of portal hypertension. 4. Aortic Atherosclerosis (ICD10-I70.0).  Assessment and Plan: This is a 64 y.o. male with mid-transverse colon cancer.  Discussed with the patient that given the different location on CT scan compared to what we thought on colonoscopy, our surgical plan is different now.  Given this, we would proceed with a laparoscopic extended right colectomy.  This would allow for an ileo-transverse colon anastomosis.  Discussed with the patient the risks of bleeding, infection, injury to surrounding structures.  Discussed that the goal would still remain to do this via laparoscopy, but there is still a possibility that we may need to convert to open procedure.  Discussed that after surgery and recovery, he may have issues with some loose stools which can be controlled with anti-diarrheals, but nutrition should not be impacted as we're not removing any significant length of small bowel.  Patient has all the instructions for bowel prep and prescriptions for bowel prep as well.  He is getting COVID tested today.  Face-to-face time spent with the patient and care providers was 15 minutes, with more than 50% of the time spent counseling, educating, and coordinating care of the patient.     Melvyn Neth, [facility]

## 2019-11-13 NOTE — Pre-Procedure Instructions (Signed)
Notifed Dr Hampton Abbot of abnormal labs via secure chat.

## 2019-11-13 NOTE — Progress Notes (Signed)
11/13/2019  History of Present Illness: Chris George is a 64 y.o. male presenting for further discussion of his CT scan prior to surgery scheduled next week.  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  He had a CT chest, abdomen, and pelvis on 1/6 for staging.  This showed the mass to actually be in the mid transverse colon.  There are also some enlarged lymph nodes in the vicinity.  Otherwise, no evidence of distant organ metastasis.  Past Medical History: Past Medical History:  Diagnosis Date  . A-fib ([place])   . Anemia   . Arthritis    ankles  . Cancer ([place])    multiple myeloma  . 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 Cardiomyopathy ([place])   . CHF (congestive heart failure) ([place])   . Depression   . Dysrhythmia    atrial fib  . HLD (hyperlipidemia)   . Hypercholesteremia   . Hypertension   . Moderate tricuspid insufficiency   . Multiple myeloma ([place])    not being treated right now per pt 11/11/19  . Pneumonia   . Sleep apnea    No CPAP     Past Surgical History: Past Surgical History:  Procedure Laterality Date  . ATRIAL ABLATION SURGERY    . CARDIAC SURGERY     A-Fib Ablations  . COLONOSCOPY WITH PROPOFOL N/A 10/26/2019   Procedure: COLONOSCOPY WITH BIOPSY;  Surgeon: Lucilla Lame, MD;  Location: [facility];  Service: Endoscopy;  Laterality: N/A;  . ESOPHAGOGASTRODUODENOSCOPY (EGD) WITH PROPOFOL N/A 10/26/2019   Procedure: ESOPHAGOGASTRODUODENOSCOPY (EGD) WITH BIOPSY;  Surgeon: Lucilla Lame, MD;  Location: [facility];  Service: Endoscopy;  Laterality: N/A;  sleep apnea    Home Medications: Prior to Admission medications   Medication Sig Start Date End Date Taking? Authorizing Provider  APPLE CIDER VINEGAR PO Take 30-45 mLs by mouth every other day.    Yes [provider]  aspirin 325 MG EC tablet Take 325 mg by mouth every other day. In the morning   Yes [provider]  bisacodyl (DULCOLAX)  5 MG EC tablet Take all 4 tablets at 8 am the morning prior to your surgery. 11/03/19  Yes Kenrick Pore, Jacqulyn Bath, MD  digoxin (DIGOX) 0.25 MG tablet Take 0.25 mg by mouth daily.    Yes [provider]  docusate sodium (COLACE) 100 MG capsule Take 1 capsule (100 mg total) by mouth 2 (two) times daily. 05/13/19 05/12/20 Yes Ojie, Jude, MD  erythromycin base (E-MYCIN) 500 MG tablet Take 2 tablets at 8am, 2 tablets at 2pm, and 2 tablets at 8pm the day prior to surgery. 11/03/19  Yes Jadelynn Boylan, Jacqulyn Bath, MD  ferrous sulfate 325 (65 FE) MG EC tablet Take 1 tablet (325 mg total) by mouth 2 (two) times daily. 05/13/19 05/12/20 Yes Ojie, Jude, MD  furosemide (LASIX) 80 MG tablet Take 80 mg by mouth 2 (two) times daily. Morning & late afternoon   Yes [provider]  Misc Natural Products (TART CHERRY ADVANCED PO) Take 60 mLs by mouth every other day.   Yes [provider]  neomycin (MYCIFRADIN) 500 MG tablet Take 2 tablet at 8am, take 2 tablets at 2pm, and take 2 tablets at 8pm the day prior to your surgery 11/03/19  Yes Zakaria Fromer, MD  polyethylene glycol powder (MIRALAX) 17 GM/SCOOP powder Mix full container in 64 ounces of Gatorade or other clear liquid. 11/03/19  Yes Tenita Cue, Jacqulyn Bath, MD  potassium bicarbonate (KLOR-CON/EF)  25 MEQ disintegrating tablet Take 25 mEq by mouth 4 (four) times a week. In the afternoon.   Yes [provider]    Allergies: No Known Allergies  Review of Systems: Review of Systems  Constitutional: Negative for chills and fever.  Respiratory: Negative for shortness of breath.   Cardiovascular: Negative for chest pain.  Gastrointestinal: Negative for abdominal pain, nausea and vomiting.    Physical Exam BP 133/78   Pulse 66   Temp 97.9 F (36.6 C)   Resp 14   Ht '5\' 9"'$  (1.753 m)   Wt 101.2 kg   SpO2 97%   BMI 32.93 kg/m  CONSTITUTIONAL: No acute distress Rest of exam deferred, as this appointment was to review CT scan results and discuss surgical  plans.  Labs/Imaging: CT chest, abdomen, and pelvis 11/11/19: IMPRESSION: 1. Large apple-core lesion involving the mid transverse colon consistent with the patient's known colon cancer. No evidence of bowel obstruction or perforation. 2. Prominent lymph nodes in the base of the mesentery adjacent to the colonic lesion, suspicious for nodal metastases. No evidence of distant metastatic disease. 3. Hepatic cirrhosis. Possible early recanalization of the left paraumbilical vein and left internal iliac varices. No other signs of portal hypertension. 4. Aortic Atherosclerosis (ICD10-I70.0).  Assessment and Plan: This is a 64 y.o. male with mid-transverse colon cancer.  Discussed with the patient that given the different location on CT scan compared to what we thought on colonoscopy, our surgical plan is different now.  Given this, we would proceed with a laparoscopic extended right colectomy.  This would allow for an ileo-transverse colon anastomosis.  Discussed with the patient the risks of bleeding, infection, injury to surrounding structures.  Discussed that the goal would still remain to do this via laparoscopy, but there is still a possibility that we may need to convert to open procedure.  Discussed that after surgery and recovery, he may have issues with some loose stools which can be controlled with anti-diarrheals, but nutrition should not be impacted as we're not removing any significant length of small bowel.  Patient has all the instructions for bowel prep and prescriptions for bowel prep as well.  He is getting COVID tested today.  Face-to-face time spent with the patient and care providers was 15 minutes, with more than 50% of the time spent counseling, educating, and coordinating care of the patient.     Melvyn Neth, [facility]

## 2019-11-13 NOTE — Patient Instructions (Addendum)
As discussed your surgery will be on 11/17/2019. Make sure to complete the Bowel Prep prior to your surgery date.    Laparoscopic Colectomy Laparoscopic colectomy is surgery to remove part or all of the large intestine (colon). This procedure may be used to treat several conditions, including:  Inflammation and infection of the colon (diverticulitis).  Tumors or masses in the colon.  Inflammatory bowel disease, such as Crohn disease or ulcerative colitis. Colectomy is an option when symptoms cannot be controlled with medicines.  Bleeding from the colon that cannot be controlled by another method.  Blockage or obstruction of the colon. Tell a health care provider about:  Any allergies you have.  All medicines you are taking, including vitamins, herbs, eye drops, creams, and over-the-counter medicines.  Any problems you or family members have had with anesthetic medicines.  Any blood disorders you have.  Any surgeries you have had.  Any medical conditions you have. What are the risks? Generally, this is a safe procedure. However, problems may occur, including:  Infection.  Bleeding.  Allergic reactions to medicines or dyes.  Damage to other structures or organs.  Leaking from where the colon was sewn together.  Future blockage of the small intestines from scar tissue. Another surgery may be needed to repair this.  Needing to convert to an open procedure. Complications such as damage to other organs or excessive bleeding may require the surgeon to convert from a laparoscopic procedure to an open procedure. This involves making a larger incision in the abdomen. What happens before the procedure? Staying hydrated Follow instructions from your health care provider about hydration, which may include:  Up to 2 hours before the procedure - you may continue to drink clear liquids, such as water, clear fruit juice, black coffee, and plain tea. Eating and drinking  restrictions Follow instructions from your health care provider about eating and drinking, which may include:  8 hours before the procedure - stop eating heavy meals, meals with high fiber, or foods such as meat, fried foods, or fatty foods.  6 hours before the procedure - stop eating light meals or foods, such as toast or cereal.  6 hours before the procedure - stop drinking milk or drinks that contain milk.  2 hours before the procedure - stop drinking clear liquids. Medicines  Ask your health care provider about: ? Changing or stopping your regular medicines. This is especially important if you are taking diabetes medicines or blood thinners. ? Taking medicines such as aspirin and ibuprofen. These medicines can thin your blood. Do not take these medicines before your procedure if your health care provider instructs you not to.  You may be given antibiotic medicine to clean out bacteria from your colon. Follow the directions carefully and take the medicine at the correct time. General instructions  You may be prescribed an oral bowel prep to clean out your colon in preparation for the surgery: ? Follow instructions from your health care provider about how to do this. ? Do not eat or drink anything else after you have started the bowel prep, unless your health care provider tells you it is safe to do so.  Do not use any products that contain nicotine or tobacco, such as cigarettes and e-cigarettes. If you need help quitting, ask your health care provider. What happens during the procedure?  To reduce your risk of infection: ? Your health care team will wash or sanitize their hands. ? Your skin will be washed with soap.  An IV tube will be inserted into one of your veins to deliver fluid and medication.  You will be given one of the following: ? A medicine to help you relax (sedative). ? A medicine to make you fall asleep (general anesthetic).  Small monitors will be connected to  your body. They will be used to check your heart, blood pressure, and oxygen level.  A breathing tube may be placed into your lungs during the procedure.  A thin, flexible tube (catheter) will be placed into your bladder to drain urine.  A tube may be placed through your nose and into your stomach to drain stomach fluids (nasogastric tube, or NG tube).  Your abdomen will be filled with air so it expands. This gives the surgeon more room to operate and makes your organs easier to see.  Several small cuts (incisions) will be made in your abdomen.  A thin, lighted tube with a tiny camera on the end (laparoscope) will be put through one of the small incisions. The camera on the laparoscope will send a picture to a computer screen in the operating room. This will give the surgeon a good view inside your abdomen.  Hollow tubes will be put through the other small incisions in your abdomen. The tools that are needed for the procedure will be put through these tubes.  Clamps or staples will be put on both ends of the diseased part of the colon.  The part of the intestine between the clamps or staples will be removed.  If possible, the ends of the healthy colon that remain will be stitched (sutured) or stapled together to allow your body to pass waste (stool).  Sometimes, the remaining colon cannot be stitched back together. If this is the case, a colostomy will be needed. If you need a colostomy: ? An opening to the outside of your body (stoma) will be made through your abdomen. ? The end of your colon will be brought to the opening. It will be stitched to the skin. ? A bag will be attached to the opening. Stool will drain into this removable bag. ? The colostomy may be temporary or permanent.  The incisions from the colectomy will be closed with sutures or staples. The procedure may vary among health care providers and hospitals. What happens after the procedure?  Your blood pressure, heart  rate, breathing rate, and blood oxygen level will be monitored until the medicines you were given have worn off.  You will receive fluids through an IV tube until your bowels start to work properly.  Once your bowels are working again, you will be given clear liquids first and then solid food as tolerated.  You will be given medicines to control your pain and nausea, if needed.  Do not drive for 24 hours if you were given a sedative. This information is not intended to replace advice given to you by your health care provider. Make sure you discuss any questions you have with your health care provider. Document Revised: 10/04/2017 Document Reviewed: 07/23/2016 Elsevier Patient Education  2020 [employer].

## 2019-11-16 MED ORDER — SODIUM CHLORIDE 0.9 % IV SOLN
2.0000 g | INTRAVENOUS | Status: AC
  Administered 2019-11-17: 09:00:00 2 g via INTRAVENOUS
  Filled 2019-11-16: qty 2

## 2019-11-17 ENCOUNTER — Other Ambulatory Visit: Payer: Self-pay

## 2019-11-17 ENCOUNTER — Inpatient Hospital Stay
Admission: RE | Admit: 2019-11-17 | Discharge: 2019-11-21 | DRG: 330 | Disposition: A | Discharge status: Home / Self Care | Payer: BC Managed Care – PPO | Attending: Surgery | Admitting: Surgery

## 2019-11-17 ENCOUNTER — Encounter: Payer: Self-pay | Admitting: Surgery

## 2019-11-17 ENCOUNTER — Inpatient Hospital Stay: Payer: BC Managed Care – PPO | Admitting: Registered Nurse

## 2019-11-17 ENCOUNTER — Encounter
Admission: RE | Disposition: A | Discharge status: Home / Self Care | Payer: Self-pay | Source: Home / Self Care | Attending: Surgery

## 2019-11-17 DIAGNOSIS — K429 Umbilical hernia without obstruction or gangrene: Secondary | ICD-10-CM | POA: Diagnosis present

## 2019-11-17 DIAGNOSIS — D649 Anemia, unspecified: Secondary | ICD-10-CM | POA: Diagnosis present

## 2019-11-17 DIAGNOSIS — R599 Enlarged lymph nodes, unspecified: Secondary | ICD-10-CM | POA: Diagnosis present

## 2019-11-17 DIAGNOSIS — I429 Cardiomyopathy, unspecified: Secondary | ICD-10-CM | POA: Diagnosis present

## 2019-11-17 DIAGNOSIS — E860 Dehydration: Secondary | ICD-10-CM | POA: Diagnosis not present

## 2019-11-17 DIAGNOSIS — C184 Malignant neoplasm of transverse colon: Principal | ICD-10-CM | POA: Diagnosis present

## 2019-11-17 DIAGNOSIS — Z79899 Other long term (current) drug therapy: Secondary | ICD-10-CM

## 2019-11-17 DIAGNOSIS — E785 Hyperlipidemia, unspecified: Secondary | ICD-10-CM | POA: Diagnosis present

## 2019-11-17 DIAGNOSIS — C9000 Multiple myeloma not having achieved remission: Secondary | ICD-10-CM | POA: Diagnosis present

## 2019-11-17 DIAGNOSIS — N179 Acute kidney failure, unspecified: Secondary | ICD-10-CM | POA: Diagnosis not present

## 2019-11-17 DIAGNOSIS — C772 Secondary and unspecified malignant neoplasm of intra-abdominal lymph nodes: Secondary | ICD-10-CM | POA: Diagnosis present

## 2019-11-17 DIAGNOSIS — K7460 Unspecified cirrhosis of liver: Secondary | ICD-10-CM | POA: Diagnosis present

## 2019-11-17 DIAGNOSIS — F329 Major depressive disorder, single episode, unspecified: Secondary | ICD-10-CM | POA: Diagnosis present

## 2019-11-17 DIAGNOSIS — Z7982 Long term (current) use of aspirin: Secondary | ICD-10-CM | POA: Diagnosis not present

## 2019-11-17 DIAGNOSIS — Z20822 Contact with and (suspected) exposure to covid-19: Secondary | ICD-10-CM | POA: Diagnosis present

## 2019-11-17 HISTORY — PX: LAPAROSCOPIC PARTIAL COLECTOMY: SHX5907

## 2019-11-17 LAB — GLUCOSE, CAPILLARY: Glucose-Capillary: 134 mg/dL — ABNORMAL HIGH (ref 70–99)

## 2019-11-17 SURGERY — LAPAROSCOPIC PARTIAL COLECTOMY
Anesthesia: General | Laterality: Right

## 2019-11-17 MED ORDER — CHLORHEXIDINE GLUCONATE CLOTH 2 % EX PADS: 6.0000 | MEDICATED_PAD | Freq: Once | CUTANEOUS | Status: DC

## 2019-11-17 MED ORDER — GLYCOPYRROLATE 0.2 MG/ML IJ SOLN
INTRAMUSCULAR | Status: DC | PRN
  Administered 2019-11-17: .2 mg via INTRAVENOUS

## 2019-11-17 MED ORDER — LIDOCAINE IN D5W 4-5 MG/ML-% IV SOLN
INTRAVENOUS | Status: DC | PRN
  Administered 2019-11-17: 3.294 ug/kg/min via INTRAVENOUS

## 2019-11-17 MED ORDER — HYDROMORPHONE HCL 1 MG/ML IJ SOLN: 0.5000 mg | INTRAMUSCULAR | Status: DC | PRN

## 2019-11-17 MED ORDER — EPHEDRINE SULFATE 50 MG/ML IJ SOLN
INTRAMUSCULAR | Status: DC | PRN
  Administered 2019-11-17: 10 mg via INTRAVENOUS

## 2019-11-17 MED ORDER — FENTANYL CITRATE (PF) 100 MCG/2ML IJ SOLN
INTRAMUSCULAR | Status: DC | PRN
  Administered 2019-11-17: 50 ug via INTRAVENOUS
  Administered 2019-11-17: 100 ug via INTRAVENOUS
  Administered 2019-11-17: 50 ug via INTRAVENOUS

## 2019-11-17 MED ORDER — POLYETHYLENE GLYCOL 3350 17 G PO PACK: 17.0000 g | PACK | Freq: Every day | ORAL | Status: DC | PRN

## 2019-11-17 MED ORDER — ENOXAPARIN SODIUM 40 MG/0.4ML ~~LOC~~ SOLN
40.0000 mg | SUBCUTANEOUS | Status: DC
  Administered 2019-11-18 – 2019-11-20 (×3): 40 mg via SUBCUTANEOUS
  Filled 2019-11-17 (×3): qty 0.4

## 2019-11-17 MED ORDER — LACTATED RINGERS IV SOLN: INTRAVENOUS | Status: DC

## 2019-11-17 MED ORDER — ONDANSETRON HCL 4 MG/2ML IJ SOLN: 4.0000 mg | Freq: Four times a day (QID) | INTRAMUSCULAR | Status: DC | PRN

## 2019-11-17 MED ORDER — PANTOPRAZOLE SODIUM 40 MG IV SOLR
40.0000 mg | Freq: Every day | INTRAVENOUS | Status: DC
  Administered 2019-11-17 – 2019-11-19 (×3): 40 mg via INTRAVENOUS
  Filled 2019-11-17 (×4): qty 40

## 2019-11-17 MED ORDER — PROPOFOL 500 MG/50ML IV EMUL
INTRAVENOUS | Status: DC | PRN
  Administered 2019-11-17 (×2): 130 ug/kg/min via INTRAVENOUS
  Administered 2019-11-17: 100 ug/kg/min via INTRAVENOUS

## 2019-11-17 MED ORDER — PROPOFOL 500 MG/50ML IV EMUL
INTRAVENOUS | Status: AC
  Filled 2019-11-17: qty 50

## 2019-11-17 MED ORDER — LIDOCAINE HCL (PF) 2 % IJ SOLN
INTRAMUSCULAR | Status: AC
  Filled 2019-11-17: qty 5

## 2019-11-17 MED ORDER — LIDOCAINE HCL (CARDIAC) PF 100 MG/5ML IV SOSY
PREFILLED_SYRINGE | INTRAVENOUS | Status: DC | PRN
  Administered 2019-11-17: 100 mg via INTRAVENOUS

## 2019-11-17 MED ORDER — OXYCODONE HCL 5 MG/5ML PO SOLN: 5.0000 mg | Freq: Once | ORAL | Status: DC | PRN

## 2019-11-17 MED ORDER — ACETAMINOPHEN 500 MG PO TABS: 1000.0000 mg | ORAL_TABLET | ORAL | Status: DC

## 2019-11-17 MED ORDER — BUPIVACAINE HCL (PF) 0.25 % IJ SOLN
INTRAMUSCULAR | Status: AC
  Filled 2019-11-17: qty 30

## 2019-11-17 MED ORDER — FAMOTIDINE 20 MG PO TABS: 20.0000 mg | ORAL_TABLET | Freq: Once | ORAL | Status: DC

## 2019-11-17 MED ORDER — ALVIMOPAN 12 MG PO CAPS
ORAL_CAPSULE | ORAL | Status: AC
  Administered 2019-11-17: 12 mg via ORAL
  Filled 2019-11-17: qty 1

## 2019-11-17 MED ORDER — KETOROLAC TROMETHAMINE 30 MG/ML IJ SOLN
15.0000 mg | Freq: Four times a day (QID) | INTRAMUSCULAR | Status: DC
  Administered 2019-11-17 (×2): 15 mg via INTRAVENOUS
  Filled 2019-11-17 (×2): qty 1

## 2019-11-17 MED ORDER — PROPOFOL 10 MG/ML IV BOLUS
INTRAVENOUS | Status: AC
  Filled 2019-11-17: qty 20

## 2019-11-17 MED ORDER — EPINEPHRINE PF 1 MG/ML IJ SOLN
INTRAMUSCULAR | Status: AC
  Filled 2019-11-17: qty 1

## 2019-11-17 MED ORDER — OXYCODONE HCL 5 MG/5ML PO SOLN
5.0000 mg | ORAL | Status: DC | PRN
  Administered 2019-11-19 – 2019-11-21 (×6): 5 mg via ORAL
  Filled 2019-11-17 (×6): qty 5

## 2019-11-17 MED ORDER — SODIUM CHLORIDE (PF) 0.9 % IJ SOLN
INTRAMUSCULAR | Status: AC
  Filled 2019-11-17: qty 10

## 2019-11-17 MED ORDER — ROCURONIUM BROMIDE 50 MG/5ML IV SOLN
INTRAVENOUS | Status: AC
  Filled 2019-11-17: qty 1

## 2019-11-17 MED ORDER — SODIUM CHLORIDE 0.9 % IV SOLN
INTRAVENOUS | Status: DC | PRN
  Administered 2019-11-17: 09:00:00 40 ug/min via INTRAVENOUS

## 2019-11-17 MED ORDER — ONDANSETRON HCL 4 MG/2ML IJ SOLN: 4.0000 mg | Freq: Once | INTRAMUSCULAR | Status: DC | PRN

## 2019-11-17 MED ORDER — MIDAZOLAM HCL 2 MG/2ML IJ SOLN
INTRAMUSCULAR | Status: DC | PRN
  Administered 2019-11-17: 2 mg via INTRAVENOUS

## 2019-11-17 MED ORDER — SODIUM CHLORIDE FLUSH 0.9 % IV SOLN
INTRAVENOUS | Status: AC
  Filled 2019-11-17: qty 10

## 2019-11-17 MED ORDER — ONDANSETRON 4 MG PO TBDP: 4.0000 mg | ORAL_TABLET | Freq: Four times a day (QID) | ORAL | Status: DC | PRN

## 2019-11-17 MED ORDER — FENTANYL CITRATE (PF) 250 MCG/5ML IJ SOLN
INTRAMUSCULAR | Status: AC
  Filled 2019-11-17: qty 5

## 2019-11-17 MED ORDER — SUGAMMADEX SODIUM 200 MG/2ML IV SOLN
INTRAVENOUS | Status: DC | PRN
  Administered 2019-11-17: 200 mg via INTRAVENOUS

## 2019-11-17 MED ORDER — SUCCINYLCHOLINE CHLORIDE 20 MG/ML IJ SOLN
INTRAMUSCULAR | Status: AC
  Filled 2019-11-17: qty 1

## 2019-11-17 MED ORDER — ONDANSETRON HCL 4 MG/2ML IJ SOLN
INTRAMUSCULAR | Status: DC | PRN
  Administered 2019-11-17: 4 mg via INTRAVENOUS

## 2019-11-17 MED ORDER — ACETAMINOPHEN 160 MG/5ML PO SOLN
1000.0000 mg | Freq: Four times a day (QID) | ORAL | Status: DC
  Administered 2019-11-17 – 2019-11-21 (×13): 1000 mg via ORAL
  Filled 2019-11-17 (×19): qty 40.6

## 2019-11-17 MED ORDER — ROCURONIUM BROMIDE 100 MG/10ML IV SOLN
INTRAVENOUS | Status: DC | PRN
  Administered 2019-11-17: 10 mg via INTRAVENOUS
  Administered 2019-11-17: 20 mg via INTRAVENOUS
  Administered 2019-11-17: 50 mg via INTRAVENOUS
  Administered 2019-11-17: 20 mg via INTRAVENOUS
  Administered 2019-11-17: 30 mg via INTRAVENOUS

## 2019-11-17 MED ORDER — PANTOPRAZOLE SODIUM 40 MG IV SOLR
40.0000 mg | Freq: Once | INTRAVENOUS | Status: AC
  Administered 2019-11-17: 40 mg via INTRAVENOUS
  Filled 2019-11-17 (×2): qty 40

## 2019-11-17 MED ORDER — GABAPENTIN 300 MG PO CAPS: 300.0000 mg | ORAL_CAPSULE | ORAL | Status: DC

## 2019-11-17 MED ORDER — FENTANYL CITRATE (PF) 100 MCG/2ML IJ SOLN: 25.0000 ug | INTRAMUSCULAR | Status: DC | PRN

## 2019-11-17 MED ORDER — ACETAMINOPHEN 500 MG PO TABS
ORAL_TABLET | ORAL | Status: AC
  Filled 2019-11-17: qty 2

## 2019-11-17 MED ORDER — MIDAZOLAM HCL 2 MG/2ML IJ SOLN
INTRAMUSCULAR | Status: AC
  Filled 2019-11-17: qty 2

## 2019-11-17 MED ORDER — BUPIVACAINE LIPOSOME 1.3 % IJ SUSP
INTRAMUSCULAR | Status: AC
  Filled 2019-11-17: qty 20

## 2019-11-17 MED ORDER — ALVIMOPAN 12 MG PO CAPS
ORAL_CAPSULE | ORAL | Status: AC
  Filled 2019-11-17: qty 1

## 2019-11-17 MED ORDER — OXYCODONE HCL 5 MG PO TABS: 5.0000 mg | ORAL_TABLET | Freq: Once | ORAL | Status: DC | PRN

## 2019-11-17 MED ORDER — DIGOXIN 250 MCG PO TABS
0.2500 mg | ORAL_TABLET | Freq: Every day | ORAL | Status: DC
  Administered 2019-11-17 – 2019-11-21 (×5): 0.25 mg via ORAL
  Filled 2019-11-17 (×5): qty 1

## 2019-11-17 MED ORDER — ACETAMINOPHEN 10 MG/ML IV SOLN: 1000.0000 mg | Freq: Once | INTRAVENOUS | Status: DC | PRN

## 2019-11-17 MED ORDER — BUPIVACAINE-EPINEPHRINE (PF) 0.25% -1:200000 IJ SOLN
INTRAMUSCULAR | Status: DC | PRN
  Administered 2019-11-17: 30 mL

## 2019-11-17 MED ORDER — PROPOFOL 10 MG/ML IV BOLUS
INTRAVENOUS | Status: AC
  Filled 2019-11-17: qty 40

## 2019-11-17 MED ORDER — BUPIVACAINE LIPOSOME 1.3 % IJ SUSP: 20.0000 mL | Freq: Once | INTRAMUSCULAR | Status: DC

## 2019-11-17 MED ORDER — PROPOFOL 10 MG/ML IV BOLUS
INTRAVENOUS | Status: DC | PRN
  Administered 2019-11-17: 150 mg via INTRAVENOUS

## 2019-11-17 MED ORDER — ONDANSETRON HCL 4 MG/2ML IJ SOLN
INTRAMUSCULAR | Status: AC
  Filled 2019-11-17: qty 2

## 2019-11-17 MED ORDER — GABAPENTIN 300 MG PO CAPS
ORAL_CAPSULE | ORAL | Status: AC
  Filled 2019-11-17: qty 1

## 2019-11-17 MED ORDER — ALVIMOPAN 12 MG PO CAPS: 12.0000 mg | ORAL_CAPSULE | ORAL | Status: AC

## 2019-11-17 MED ORDER — SUGAMMADEX SODIUM 200 MG/2ML IV SOLN
INTRAVENOUS | Status: AC
  Filled 2019-11-17: qty 2

## 2019-11-17 MED ORDER — GLYCOPYRROLATE 0.2 MG/ML IJ SOLN
INTRAMUSCULAR | Status: AC
  Filled 2019-11-17: qty 1

## 2019-11-17 MED ORDER — DEXAMETHASONE SODIUM PHOSPHATE 10 MG/ML IJ SOLN
INTRAMUSCULAR | Status: DC | PRN
  Administered 2019-11-17: 8 mg via INTRAVENOUS

## 2019-11-17 MED ORDER — FUROSEMIDE 40 MG PO TABS
80.0000 mg | ORAL_TABLET | Freq: Two times a day (BID) | ORAL | Status: DC
  Administered 2019-11-17: 80 mg via ORAL
  Filled 2019-11-17: qty 2

## 2019-11-17 MED ORDER — LIDOCAINE HCL (PF) 2 % IJ SOLN
INTRAMUSCULAR | Status: AC
  Filled 2019-11-17: qty 10

## 2019-11-17 MED ORDER — ALVIMOPAN 12 MG PO CAPS
12.0000 mg | ORAL_CAPSULE | Freq: Two times a day (BID) | ORAL | Status: DC
  Administered 2019-11-18 – 2019-11-19 (×3): 12 mg via ORAL
  Filled 2019-11-17 (×4): qty 1

## 2019-11-17 MED ORDER — ACETAMINOPHEN 160 MG/5ML PO SOLN
1000.0000 mg | Freq: Once | ORAL | Status: AC
  Administered 2019-11-17: 1000 mg via ORAL
  Filled 2019-11-17 (×2): qty 40.6

## 2019-11-17 MED ORDER — SODIUM CHLORIDE 0.9 % IV SOLN
2.0000 g | Freq: Three times a day (TID) | INTRAVENOUS | Status: AC
  Administered 2019-11-17 – 2019-11-18 (×2): 2 g via INTRAVENOUS
  Filled 2019-11-17 (×2): qty 2

## 2019-11-17 MED ORDER — FAMOTIDINE 20 MG PO TABS
ORAL_TABLET | ORAL | Status: AC
  Filled 2019-11-17: qty 1

## 2019-11-17 MED ORDER — BUPIVACAINE LIPOSOME 1.3 % IJ SUSP
INTRAMUSCULAR | Status: DC | PRN
  Administered 2019-11-17: 20 mL

## 2019-11-17 MED ORDER — DEXAMETHASONE SODIUM PHOSPHATE 10 MG/ML IJ SOLN
INTRAMUSCULAR | Status: AC
  Filled 2019-11-17: qty 1

## 2019-11-17 SURGICAL SUPPLY — 52 items
BLADE SURG SZ10 CARB STEEL (BLADE) ×3 IMPLANT
CANISTER SUCT 1200ML W/VALVE (MISCELLANEOUS) ×3 IMPLANT
CHLORAPREP W/TINT 26 (MISCELLANEOUS) ×3 IMPLANT
COVER WAND RF STERILE (DRAPES) ×3 IMPLANT
DECANTER SPIKE VIAL GLASS SM (MISCELLANEOUS) ×3 IMPLANT
DRSG OPSITE POSTOP 3X4 (GAUZE/BANDAGES/DRESSINGS) ×9 IMPLANT
DRSG OPSITE POSTOP 4X8 (GAUZE/BANDAGES/DRESSINGS) ×3 IMPLANT
ELECT CAUTERY BLADE 6.4 (BLADE) ×3 IMPLANT
ELECT REM PT RETURN 9FT ADLT (ELECTROSURGICAL) ×3
ELECTRODE REM PT RTRN 9FT ADLT (ELECTROSURGICAL) ×1 IMPLANT
GLOVE SURG SYN 6.5 ES PF (GLOVE) ×12 IMPLANT
GLOVE SURG SYN 7.0 (GLOVE) ×9 IMPLANT
GLOVE SURG SYN 7.5  E (GLOVE) ×8
GLOVE SURG SYN 7.5 E (GLOVE) ×4 IMPLANT
GOWN STRL REUS W/ TWL LRG LVL3 (GOWN DISPOSABLE) ×8 IMPLANT
GOWN STRL REUS W/TWL LRG LVL3 (GOWN DISPOSABLE) ×16
HANDLE YANKAUER SUCT BULB TIP (MISCELLANEOUS) ×3 IMPLANT
HOLDER FOLEY CATH W/STRAP (MISCELLANEOUS) ×3 IMPLANT
IRRIGATION STRYKERFLOW (MISCELLANEOUS) ×1 IMPLANT
IRRIGATOR STRYKERFLOW (MISCELLANEOUS) ×3
IV NS 1000ML (IV SOLUTION) ×2
IV NS 1000ML BAXH (IV SOLUTION) ×1 IMPLANT
KIT TURNOVER KIT A (KITS) ×3 IMPLANT
LABEL OR SOLS (LABEL) ×3 IMPLANT
LIGASURE LAP MARYLAND 5MM 37CM (ELECTROSURGICAL) ×3 IMPLANT
NEEDLE HYPO 22GX1.5 SAFETY (NEEDLE) ×3 IMPLANT
NS IRRIG 1000ML POUR BTL (IV SOLUTION) ×3 IMPLANT
PACK COLON CLEAN CLOSURE (MISCELLANEOUS) ×3 IMPLANT
PACK LAP CHOLECYSTECTOMY (MISCELLANEOUS) ×3 IMPLANT
PENCIL ELECTRO HAND CTR (MISCELLANEOUS) ×3 IMPLANT
RELOAD PROXIMATE 75MM BLUE (ENDOMECHANICALS) ×12 IMPLANT
RETRACTOR WOUND ALXS 18CM MED (MISCELLANEOUS) ×1 IMPLANT
RTRCTR WOUND ALEXIS O 18CM MED (MISCELLANEOUS) ×3
SLEEVE ADV FIXATION 5X100MM (TROCAR) ×9 IMPLANT
SPONGE LAP 18X18 RF (DISPOSABLE) ×6 IMPLANT
STAPLER PROXIMATE 75MM BLUE (STAPLE) ×3 IMPLANT
STAPLER SKIN PROX 35W (STAPLE) ×3 IMPLANT
SUT MNCRL 3-0 UNDYED SH (SUTURE) ×1 IMPLANT
SUT MONOCRYL 3-0 UNDYED (SUTURE) ×2
SUT PDS AB 1 CT1 36 (SUTURE) ×6 IMPLANT
SUT PDS AB 1 TP1 96 (SUTURE) IMPLANT
SUT SILK 2-0 (SUTURE) ×3 IMPLANT
SUT SILK 3-0 (SUTURE) ×9 IMPLANT
SUT VIC AB 2-0 SH 27 (SUTURE) ×2
SUT VIC AB 2-0 SH 27XBRD (SUTURE) ×1 IMPLANT
SUT VICRYL 0 AB UR-6 (SUTURE) ×3 IMPLANT
TOWEL OR 17X26 4PK STRL BLUE (TOWEL DISPOSABLE) ×3 IMPLANT
TRAY FOLEY MTR SLVR 16FR STAT (SET/KITS/TRAYS/PACK) ×3 IMPLANT
TROCAR BALLN GELPORT 12X130M (ENDOMECHANICALS) ×3 IMPLANT
TROCAR Z-THREAD FIOS 11X100 BL (TROCAR) IMPLANT
TROCAR Z-THREAD OPTICAL 5X100M (TROCAR) ×3 IMPLANT
TUBING EVAC SMOKE HEATED PNEUM (TUBING) ×3 IMPLANT

## 2019-11-17 NOTE — Anesthesia Preprocedure Evaluation (Signed)
Anesthesia Evaluation  Patient identified by MRN, date of birth, ID band Patient awake    Reviewed: Allergy & Precautions, NPO status , Patient's Chart, lab work & pertinent test results  History of Anesthesia Complications Negative for: history of anesthetic complications  Airway Mallampati: II  TM Distance: >3 FB Neck ROM: Full    Dental no notable dental hx. (+) Dental Advisory Given, Poor Dentition, Missing,    Pulmonary neg pulmonary ROS, neg sleep apnea, neg COPD, Patient abstained from smoking.Not current smoker,  Prior sleep apnea but lost 45 pounds since diagnosis and feels it is resolved   Pulmonary exam normal breath sounds clear to auscultation       Cardiovascular Exercise Tolerance: Good METShypertension, +CHF  (-) CAD and (-) Past MI negative cardio ROS  + dysrhythmias Atrial Fibrillation  Rhythm:Irregular Rate:Normal - Systolic murmurs Grade I diastolic dysfunction Active tennis player    Neuro/Psych Depression negative neurological ROS  negative psych ROS   GI/Hepatic PUD, neg GERD  ,(+)     (-) substance abuse  ,   Endo/Other  diabetes, Well Controlled  Renal/GU negative Renal ROS     Musculoskeletal   Abdominal   Peds  Hematology  (+) anemia ,   Anesthesia Other Findings Past Medical History: No date: A-fib (HCC) No date: Anemia No date: Arthritis     Comment:  ankles No date: Cancer [facility])     Comment:  multiple myeloma 10/26/2019: 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[place]) No date: Cardiomyopathy ([place]) No date: CHF (congestive heart failure) (HCC) No date: Depression No date: Dysrhythmia     Comment:  atrial fib No date: HLD (hyperlipidemia) No date: Hypercholesteremia No date: Hypertension No date: Moderate tricuspid insufficiency No date: Multiple myeloma (HCC)     Comment:  not being treated right now per pt 11/11/19 No date: Pneumonia No date: Sleep apnea     Comment:  No CPAP  Reproductive/Obstetrics                             Anesthesia Physical Anesthesia Plan  ASA: III  Anesthesia Plan: General   Post-op Pain Management: GA combined w/ Regional for post-op pain   Induction: Intravenous  PONV Risk Score and Plan: 4 or greater and TIVA, Propofol infusion, Ondansetron, Dexamethasone and Midazolam  Airway Management Planned: Oral ETT  Additional Equipment: None  Intra-op Plan:   Post-operative Plan: Extubation in OR  Informed Consent: I have reviewed the patients History and Physical, chart, labs and discussed the procedure including the risks, benefits and alternatives for the proposed anesthesia with the patient or authorized representative who has indicated his/her understanding and acceptance.     Dental advisory given  Plan Discussed with: CRNA and Surgeon  Anesthesia Plan Comments: (Discussed risks of anesthesia with patient, including PONV, sore throat, lip/dental damage. Rare risks discussed as well, such as cardiorespiratory sequelae. Patient understands.  Had extensive discussion with patient regarding poor dentition. Patient is getting most bottom teeth removed by oral surgeon soon , to be replaced with implants. Patient understands risks of dental damage and teeth lodging in airway. Patient is agreeable for anesthesia team to remove any extremely loose bottom teeth after induction if necessary for safety.)        Anesthesia Quick Evaluation

## 2019-11-17 NOTE — Progress Notes (Signed)
Advanced Directive request. Advanced Directive education offered. "Chris George" appeared to comprehend Scientist, physiological. Prayer offered for successful surgery and thanksgiving for the support around him.     11/17/19 1800  Clinical Encounter Type  Visited With Patient  Visit Type Spiritual support;Other (Comment)  Referral From Nurse  Spiritual Encounters  Spiritual Needs Other (Comment);Prayer

## 2019-11-17 NOTE — Anesthesia Procedure Notes (Signed)
Procedure Name: Intubation Date/Time: 11/17/2019 8:54 AM Performed by: Hedda Slade, CRNA Pre-anesthesia Checklist: Patient identified, Patient being monitored, Timeout performed, Emergency Drugs available and Suction available Patient Re-evaluated:Patient Re-evaluated prior to induction Oxygen Delivery Method: Circle system utilized Preoxygenation: Pre-oxygenation with 100% oxygen Induction Type: IV induction Ventilation: Mask ventilation without difficulty Laryngoscope Size: McGraph and 4 Grade View: Grade I Tube type: Oral Tube size: 7.5 mm Number of attempts: 1 Airway Equipment and Method: Stylet Placement Confirmation: ETT inserted through vocal cords under direct vision,  positive ETCO2 and breath sounds checked- equal and bilateral Secured at: 23 cm Tube secured with: Tape Dental Injury: Teeth and Oropharynx as per pre-operative assessment  Difficulty Due To: Difficult Airway- due to dentition Comments: Mcgrath used d/t poor dentition.  Bottom teeth very loose.  All teeth present and intact after intubation

## 2019-11-17 NOTE — Transfer of Care (Signed)
Immediate Anesthesia Transfer of Care Note  Patient: Chris George  Procedure(s) Performed: LAPAROSCOPIC PARTIAL COLECTOMY RIGHT EXTENDED (Right )  Patient Location: PACU  Anesthesia Type:General  Level of Consciousness: drowsy  Airway & Oxygen Therapy: Patient Spontanous Breathing and Patient connected to face mask oxygen  Post-op Assessment: Report given to RN and Post -op Vital signs reviewed and stable  Post vital signs: Reviewed and stable  Last Vitals:  Vitals Value Taken Time  BP 110/72 11/17/19 1336  Temp 36.5 C 11/17/19 1336  Pulse 61 11/17/19 1338  Resp 13 11/17/19 1338  SpO2 100 % 11/17/19 1338  Vitals shown include unvalidated device data.  Last Pain:  Vitals:   11/17/19 1336  TempSrc: Temporal  PainSc: 0-No pain         Complications: No apparent anesthesia complications

## 2019-11-17 NOTE — Interval H&P Note (Signed)
History and Physical Interval Note:  11/17/2019 8:24 AM  Chris George  has presented today for surgery, with the diagnosis of transverse colon cancer.  The various methods of treatment have been discussed with the patient and family. After consideration of risks, benefits and other options for treatment, the patient has consented to  Procedure(s): LAPAROSCOPIC PARTIAL COLECTOMY RIGHT EXTENDED (Right) as a surgical intervention.  The patient's history has been reviewed, patient examined, no change in status, stable for surgery.  I have reviewed the patient's chart and labs.  Questions were answered to the patient's satisfaction.     Leanne Sisler

## 2019-11-17 NOTE — Op Note (Signed)
Procedure Date:  11/17/2019  Pre-operative Diagnosis:  Transverse colon cancer  Post-operative Diagnosis:   Transverse colon cancer, umbilical hernia  Procedure:  Laparoscopic Extended Right Colectomy, open umbilical hernia repair, partial omentectomy.  Surgeon:  Melvyn Neth, MD  Assistant:  Nestor Lewandowsky, MD.  His assistance was needed and critical for appropriate exposure and retraction, anastomosis creation, and given the complex nature of the case.  Also assisting was [employer], PA-S.  Anesthesia:  General endotracheal  Estimated Blood Loss:  50 ml  Specimens:  Right and transverse colon with terminal ileum; partial omentum; anastomotic common channel  Complications:  None  Indications for Procedure:  This is a 64 y.o. male who presents with a new diagnosis of transverse colon cancer.  The benefits, complications, treatment options, and expected outcomes were discussed with the patient. The risks of bleeding, infection, bowel injury, and need for further procedures were all discussed with the patient and he was willing to proceed.  Description of Procedure: The patient was correctly identified in the preoperative area and brought into the operating room.  The patient was placed supine with VTE prophylaxis in place.  Appropriate time-outs were performed.  Anesthesia was induced and the patient was intubated.  Foley catheter was placed.  Appropriate antibiotics were infused.  The abdomen was prepped and draped in a sterile fashion. A supraumbilical incision was made. A cutdown technique was used to enter the abdominal cavity without injury, and a Hasson trocar was inserted.  Pneumoperitoneum was obtained with appropriate opening pressures.  Three 5-mm ports were placed in the right lower quadrant, left lower quadrant, and left upper quadrant positions under direct visualization.    We started with inspection of the abdominal cavity and there was no evidence of any extracolonic  disease.  He did have a cirrhotic liver.  There were no peritoneal implants, no enlarged masses, and no masses on the liver.  The tattooed portion of the transverse colon was visualized.  We started mobilizing the right colon along the white line of Toldt including portion of the terminal ileum and the appendix.  We carried the dissection and mobilization of the colon to the hepatic flexure.  Then we started mobilizing the hepatic flexure off the attachments that it had to the liver and gallbladder.  A combination of LigaSure and blunt dissection was used for all this mobilization.  We also dissected the omentum off of the transverse colon from hepatic flexure towards the splenic flexure and pushed it superiorly to be out of the field.  The transverse colon was also mobilized, with careful attention not to injure the duodenum.  We were able to visualize the duodenum and prevent any injury.  Once we had appropriate mobilization, we deflated the pneumoperitoneum and removed the [facility] trocar.    We extended the incision made for that trocar superiorly to make an incision measuring about 6 cm in length.  We used cautery to dissect down and extend the fascial incision as well.  We put in a small wound protector through this incision and were able to pull out the colon and terminal ileum.    We picked an area in the transverse colon more than 5 cm distal to the mass.  Using a hemostat we created a window in the mesentery of the transverse colon and used a 5 mm blue load stapler to staple the colon.  We then went to the terminal ileum to an area proximal to the supply of the ileocolic branches and  created a window in the mesentery using a hemostat and fired a stapler in same fashion.  Then using LigaSure we were able to take down the mesentery from the terminal ileum to the transverse colon.  LigaSure was used to cauterize the pedicles of the ileocolic, right colic, and middle colic bundles.  The specimen then came off  en bloc including terminal ileum, appendix, right colon, and 2/3 of the transverse colon.  This was sent out to pathology.  Then the blind ends of our staple lines were put together and lined up at the antimesenteric borders and secured in place using two 3-0 silk sutures.  Corners of the staple lines were cut using Mayo scissors and a GIA 75 mm blue load stapler was used to create a common channel between terminal ileum and transverse colon.  The same stapler was then used to close the opening of the common channel.  3-0 silk sutures were used to imbricate the staple line for protection.  The intestines were then placed back into the abdomen.  The omentum was then brought back down and the distal portion appeared to be mildly ischemic, and it was decided to resect it.  The wound protector was removed, and all the ports were removed.  We then rescrubbed and started the clean closure portion.  The midline incision was extended inferiorly in order to incorporate the umbilical hernia.  The stalk was detached from the fascia.  Cautery was used for hemostasis.  60 mL total of Exparel solution was then infiltrated in the peritoneum and fascia of the biggest incision as well as subcutaneously over the 3 remaining port sites.  The midline incision was then closed using #1 PDS suture x2.  The umbilical stalk was reattached using 2-0 Vicryl sutures.  All the wounds were then irrigated and closed using a skin stapler.  All the wounds were then cleaned and dressed using honeycomb dressings.   The patient was emerged from anesthesia and extubated and brought to the recovery room for further management.   The patient tolerated the procedure well and all counts were correct at the end of the case.    Melvyn Neth, MD

## 2019-11-17 NOTE — OR Nursing (Signed)
Dr Hampton Abbot in to see patient consent changed and signed to say " laparoscopic extended right colectomy"

## 2019-11-18 LAB — CBC WITH DIFFERENTIAL/PLATELET
Abs Immature Granulocytes: 0.02 10*3/uL (ref 0.00–0.07)
Basophils Absolute: 0 10*3/uL (ref 0.0–0.1)
Basophils Relative: 0 %
Eosinophils Absolute: 0 10*3/uL (ref 0.0–0.5)
Eosinophils Relative: 0 %
HCT: 34.2 % — ABNORMAL LOW (ref 39.0–52.0)
Hemoglobin: 10.5 g/dL — ABNORMAL LOW (ref 13.0–17.0)
Immature Granulocytes: 0 %
Lymphocytes Relative: 4 %
Lymphs Abs: 0.2 10*3/uL — ABNORMAL LOW (ref 0.7–4.0)
MCH: 27.1 pg (ref 26.0–34.0)
MCHC: 30.7 g/dL (ref 30.0–36.0)
MCV: 88.1 fL (ref 80.0–100.0)
Monocytes Absolute: 0.7 10*3/uL (ref 0.1–1.0)
Monocytes Relative: 11 %
Neutro Abs: 5.8 10*3/uL (ref 1.7–7.7)
Neutrophils Relative %: 85 %
Platelets: 142 10*3/uL — ABNORMAL LOW (ref 150–400)
RBC: 3.88 MIL/uL — ABNORMAL LOW (ref 4.22–5.81)
RDW: 15.9 % — ABNORMAL HIGH (ref 11.5–15.5)
WBC: 6.8 10*3/uL (ref 4.0–10.5)
nRBC: 0 % (ref 0.0–0.2)

## 2019-11-18 LAB — BASIC METABOLIC PANEL
Anion gap: 9 (ref 5–15)
Anion gap: 9 (ref 5–15)
BUN: 23 mg/dL (ref 8–23)
BUN: 25 mg/dL — ABNORMAL HIGH (ref 8–23)
CO2: 25 mmol/L (ref 22–32)
CO2: 24 mmol/L (ref 22–32)
Calcium: 8.2 mg/dL — ABNORMAL LOW (ref 8.9–10.3)
Calcium: 8.1 mg/dL — ABNORMAL LOW (ref 8.9–10.3)
Chloride: 104 mmol/L (ref 98–111)
Chloride: 104 mmol/L (ref 98–111)
Creatinine, Ser: 2.54 mg/dL — ABNORMAL HIGH (ref 0.61–1.24)
Creatinine, Ser: 2.6 mg/dL — ABNORMAL HIGH (ref 0.61–1.24)
GFR calc Af Amer: 30 mL/min — ABNORMAL LOW (ref >60)
GFR calc Af Amer: 29 mL/min — ABNORMAL LOW (ref >60)
GFR calc non Af Amer: 26 mL/min — ABNORMAL LOW (ref >60)
GFR calc non Af Amer: 25 mL/min — ABNORMAL LOW (ref >60)
Glucose, Bld: 111 mg/dL — ABNORMAL HIGH (ref 70–99)
Glucose, Bld: 105 mg/dL — ABNORMAL HIGH (ref 70–99)
Potassium: 3.8 mmol/L (ref 3.5–5.1)
Potassium: 3.8 mmol/L (ref 3.5–5.1)
Sodium: 138 mmol/L (ref 135–145)
Sodium: 137 mmol/L (ref 135–145)

## 2019-11-18 LAB — MAGNESIUM: Magnesium: 1.8 mg/dL (ref 1.7–2.4)

## 2019-11-18 MED ORDER — SODIUM CHLORIDE 0.9 % IV SOLN: INTRAVENOUS | Status: DC

## 2019-11-18 MED ORDER — CHLORHEXIDINE GLUCONATE CLOTH 2 % EX PADS
6.0000 | MEDICATED_PAD | Freq: Every day | CUTANEOUS | Status: DC
  Administered 2019-11-18: 6 via TOPICAL

## 2019-11-18 NOTE — Anesthesia Postprocedure Evaluation (Signed)
Anesthesia Post Note  Patient: Chris George  Procedure(s) Performed: LAPAROSCOPIC PARTIAL COLECTOMY RIGHT EXTENDED (Right )  Patient location during evaluation: PACU Anesthesia Type: General Level of consciousness: awake and alert and oriented Pain management: pain level controlled Vital Signs Assessment: post-procedure vital signs reviewed and stable Respiratory status: spontaneous breathing, nonlabored ventilation and respiratory function stable Cardiovascular status: blood pressure returned to baseline and stable Postop Assessment: no signs of nausea or vomiting Anesthetic complications: no     Last Vitals:  Vitals:   11/17/19 2114 11/18/19 0419  BP: 106/80 118/71  Pulse: (!) 56 72  Resp: 20 20  Temp: 36.6 C 36.8 C  SpO2: 97% 99%    Last Pain:  Vitals:   11/18/19 0419  TempSrc: Oral  PainSc:                  Alleyne Lac

## 2019-11-18 NOTE — Progress Notes (Signed)
[place] Hospital Day(s): 1.   Post op day(s): 1 Day Post-Op.   Interval History:  Patient seen and examined no acute events or new complaints overnight.  Patient reports no significant complaints. No fever, chills, nausea, emesis, or abdominal pain Renal function elevated (sCr - 2.54 --> baseline is ~1.45); U/O - 210 ccs No leukocytosis, afebrile Has not mobilized  Vital signs in last 24 hours: [min-max] current  Temp:  [97.1 F (36.2 C)-98.4 F (36.9 C)] 98.2 F (36.8 C) (01/13 0419) Pulse Rate:  [56-82] 72 (01/13 0419) Resp:  [12-21] 20 (01/13 0419) BP: (101-124)/(62-87) 118/71 (01/13 0419) SpO2:  [96 %-100 %] 99 % (01/13 0419) Weight:  [102.4 kg] 102.4 kg (01/12 1806)     Height: 5\' 9"  (175.3 cm) Weight: 102.4 kg BMI (Calculated): 33.32   Intake/Output last 2 shifts:  01/12 0701 - 01/13 0700 In: 2561.9 [I.V.:2261.9; IV Piggyback:300] Out: 285 [Urine:210; Blood:75]   Physical Exam:  Constitutional: alert, cooperative and no distress  Respiratory: breathing non-labored at rest  Cardiovascular: regular rate and sinus rhythm  Gastrointestinal: soft, non-tender, and non-distended. No rebound/guarding Genitourinary: foley in place Integumentary: laparoscopic and laparotomy incision are CDI with staples, no erythema, minimal drainage on dressings   Labs:  CBC Latest Ref Rng & Units 11/18/2019 11/13/2019 09/09/2019  WBC 4.0 - 10.5 K/uL 6.8 3.1(L) 3.8(L)  Hemoglobin 13.0 - 17.0 g/dL 10.5(L) 11.5(L) 10.1(L)  Hematocrit 39.0 - 52.0 % 34.2(L) 37.6(L) 32.2(L)  Platelets 150 - 400 K/uL 142(L) 173 168   CMP Latest Ref Rng & Units 11/18/2019 11/13/2019 11/11/2019  Glucose 70 - 99 mg/dL 111(H) 99 -  BUN 8 - 23 mg/dL 23 18 -  Creatinine 0.61 - 1.24 mg/dL 2.54(H) 1.49(H) 1.42(H)  Sodium 135 - 145 mmol/L 138 138 -  Potassium 3.5 - 5.1 mmol/L 3.8 3.5 -  Chloride 98 - 111 mmol/L 104 102 -  CO2 22 - 32 mmol/L 25 29 -  Calcium 8.9 - 10.3 mg/dL  8.2(L) 8.5(L) -  Total Protein 6.5 - 8.1 g/dL - 7.8 -  Total Bilirubin 0.3 - 1.2 mg/dL - 0.9 -  Alkaline Phos 38 - 126 U/L - 133(H) -  AST 15 - 41 U/L - 42(H) -  ALT 0 - 44 U/L - 30 -     Imaging studies: No new pertinent imaging studies   Assessment/Plan: 64 y.o. male with bump in renal function most likely secondary to dehydration from surgery  1 Day Post-Op s/p laparoscopic Extended Right Colectomy for transverse colon cancer    - Advance to CLD  - Continue IVF Resuscitation (100 ml/hr NS)  - Pain control prn (discontinue Toradol given renal function)    - Keep foley today; monitor urine out; discontinue lasix  - monitor abdominal examination; on-going bowel function   - Monitor renal function; morning BMP  - Mobilize as tolerates  - medical management of comorbidities  All of the above findings and recommendations were discussed with the patient, and the medical team, and all of patient's questions were answered to his expressed satisfaction.  -- Edison Simon, PA-C [facility] 11/18/2019, 7:26 AM [phone] M-F: 7am - 4pm

## 2019-11-19 LAB — BASIC METABOLIC PANEL
Anion gap: 10 (ref 5–15)
BUN: 27 mg/dL — ABNORMAL HIGH (ref 8–23)
CO2: 23 mmol/L (ref 22–32)
Calcium: 8.4 mg/dL — ABNORMAL LOW (ref 8.9–10.3)
Chloride: 106 mmol/L (ref 98–111)
Creatinine, Ser: 2.29 mg/dL — ABNORMAL HIGH (ref 0.61–1.24)
GFR calc Af Amer: 34 mL/min — ABNORMAL LOW (ref >60)
GFR calc non Af Amer: 29 mL/min — ABNORMAL LOW (ref >60)
Glucose, Bld: 95 mg/dL (ref 70–99)
Potassium: 3.6 mmol/L (ref 3.5–5.1)
Sodium: 139 mmol/L (ref 135–145)

## 2019-11-19 LAB — DIGOXIN LEVEL: Digoxin Level: 0.6 ng/mL — ABNORMAL LOW (ref 0.8–2.0)

## 2019-11-19 NOTE — Progress Notes (Signed)
[place] Hospital Day(s): 2.   Post op day(s): 2 Days Post-Op.   Interval History:  Patient seen and examined no acute events or new complaints overnight.  Patient reports he does not feel as well as yesterday Reports incisional soreness and tightness No fever, chills, nausea, or emesis He continue to have bowel function, + BM x2 Tolerated CLD and advanced to FLD yesterday evening sCr improved some to 2.29 (from 2.60), U/O - 950 ccs Mobilizing well   Vital signs in last 24 hours: [min-max] current  Temp:  [97.7 F (36.5 C)-98 F (36.7 C)] 97.7 F (36.5 C) (01/14 0553) Pulse Rate:  [58-73] 61 (01/14 0553) Resp:  [14-20] 14 (01/14 0553) BP: (128-141)/(76-77) 128/76 (01/14 0553) SpO2:  [100 %] 100 % (01/14 0553)     Height: 5\' 9"  (175.3 cm) Weight: 102.4 kg BMI (Calculated): 33.32   Intake/Output last 2 shifts:  01/13 0701 - 01/14 0700 In: 3248.1 [P.O.:660; I.V.:2588.1] Out: 950 [Urine:950]   Physical Exam:  Constitutional: alert, cooperative and no distress  Respiratory: breathing non-labored at rest  Cardiovascular: regular rate and sinus rhythm  Gastrointestinal: soft, incisional soreness, and non-distended. No rebound/guarding Genitourinary: foley in place Integumentary: laparoscopic and laparotomy incision are CDI with staples, no erythema, minimal drainage on dressings Musculoskeletal: trace pitting edema   Labs:  CBC Latest Ref Rng & Units 11/18/2019 11/13/2019 09/09/2019  WBC 4.0 - 10.5 K/uL 6.8 3.1(L) 3.8(L)  Hemoglobin 13.0 - 17.0 g/dL 10.5(L) 11.5(L) 10.1(L)  Hematocrit 39.0 - 52.0 % 34.2(L) 37.6(L) 32.2(L)  Platelets 150 - 400 K/uL 142(L) 173 168   CMP Latest Ref Rng & Units 11/19/2019 11/18/2019 11/18/2019  Glucose 70 - 99 mg/dL 95 105(H) 111(H)  BUN 8 - 23 mg/dL 27(H) 25(H) 23  Creatinine 0.61 - 1.24 mg/dL 2.29(H) 2.60(H) 2.54(H)  Sodium 135 - 145 mmol/L 139 137 138  Potassium 3.5 - 5.1 mmol/L 3.6 3.8 3.8   Chloride 98 - 111 mmol/L 106 104 104  CO2 22 - 32 mmol/L 23 24 25   Calcium 8.9 - 10.3 mg/dL 8.4(L) 8.1(L) 8.2(L)  Total Protein 6.5 - 8.1 g/dL - - -  Total Bilirubin 0.3 - 1.2 mg/dL - - -  Alkaline Phos 38 - 126 U/L - - -  AST 15 - 41 U/L - - -  ALT 0 - 44 U/L - - -     Imaging studies: No new pertinent imaging studies   Assessment/Plan:  64 y.o. male overall doing well with improvement in renal function 2 Days Post-Op s/p laparoscopic ExtendedRight Colectomy for transverse colon cancer    - Continue full liquid diet this morning; if he tolerates this then can advance to soft diet later today   - Continue IVF resuscitation; likely decrease rate later today to limit fluid overload  - Continue pain control; avoid nephrotoxins    - Discontinue foley catheter  - monitor abdominal examination; on-going bowel function              - Monitor renal function; morning BMP             - Mobilize as tolerates             - medical management of comorbidities   All of the above findings and recommendations were discussed with the patient, and the medical team, and all of patient's questions were answered to his expressed satisfaction.  -- Edison Simon, PA-C [facility] 11/19/2019, 7:12 AM [phone] M-F: 7am -  4pm

## 2019-11-20 LAB — BASIC METABOLIC PANEL
Anion gap: 4 — ABNORMAL LOW (ref 5–15)
BUN: 19 mg/dL (ref 8–23)
CO2: 20 mmol/L — ABNORMAL LOW (ref 22–32)
Calcium: 8.4 mg/dL — ABNORMAL LOW (ref 8.9–10.3)
Chloride: 114 mmol/L — ABNORMAL HIGH (ref 98–111)
Creatinine, Ser: 1.67 mg/dL — ABNORMAL HIGH (ref 0.61–1.24)
GFR calc Af Amer: 50 mL/min — ABNORMAL LOW (ref >60)
GFR calc non Af Amer: 43 mL/min — ABNORMAL LOW (ref >60)
Glucose, Bld: 97 mg/dL (ref 70–99)
Potassium: 3.6 mmol/L (ref 3.5–5.1)
Sodium: 138 mmol/L (ref 135–145)

## 2019-11-20 MED ORDER — IBUPROFEN 100 MG/5ML PO SUSP: 600.0000 mg | Freq: Four times a day (QID) | ORAL | 1 refills | Status: DC | PRN

## 2019-11-20 MED ORDER — OXYCODONE HCL 5 MG/5ML PO SOLN: 5.0000 mg | ORAL | 0 refills | Status: AC | PRN

## 2019-11-20 MED ORDER — ALUM & MAG HYDROXIDE-SIMETH 200-200-20 MG/5ML PO SUSP
30.0000 mL | ORAL | Status: DC | PRN
  Administered 2019-11-20: 30 mL via ORAL
  Filled 2019-11-20: qty 30

## 2019-11-20 NOTE — Discharge Summary (Signed)
[facility] SURGICAL DISCHARGE SUMMARY  Patient ID: Chris George MRN: [id number] DOB/AGE: 08/01/1956 64 y.o.  Admit date: 11/17/2019 Discharge date: 11/20/2019  Discharge Diagnoses Patient Active Problem List   Diagnosis Date Noted  . 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 male with a history of transverse colon mass concerning for colon cancer who presents to [facility] on 11/17/2019 for scheduled colectomy with Dr Hampton Abbot.    Hospital Course: Informed consent was obtained and documented, and patient underwent uneventful extended Right Colectomy, open umbilical hernia repair, partial omentectomy (Dr Hampton Abbot, 11/17/2019).  Post-operatively, patient did have an acute on chronic kidney injury and a bump in his sCr. This improved with IVF resuscitation and foley was removed on POD2. He had return of bowel function on POD1. Advancement of patient's diet and ambulation were well-tolerated. The remainder of patient's hospital course was essentially unremarkable, and discharge planning was initiated accordingly with patient safely able to be discharged home with appropriate discharge instructions, pain control, and outpatient follow-up after all of his questions were answered to his expressed satisfaction.   Discharge Condition: Good   Physical Examination:  Constitutional: Well appearing male, NAD, laying in bed Pulmonary: Normal effort, no respiratory distress Gastrointestinal: soft, incisional soreness, non-distended, no rebound/guarding Skin: Incisions are CDI with staples, no erythema or drainage   Allergies as of 11/20/2019   No Known Allergies     Medication List    TAKE these medications   APPLE CIDER VINEGAR PO Take 30-45 mLs by mouth every other day.   aspirin 325 MG EC tablet Take 325  mg by mouth every other day. In the morning   Digox 0.25 MG tablet Generic drug: digoxin Take 0.25 mg by mouth daily.   furosemide 80 MG tablet Commonly known as: LASIX Take 80 mg by mouth 2 (two) times daily. Morning & late afternoon   ibuprofen 100 MG/5ML suspension Commonly known as: ADVIL Take 30 mLs (600 mg total) by mouth every 6 (six) hours as needed for mild pain or moderate pain.   Klor-Con/EF 25 MEQ disintegrating tablet Generic drug: potassium bicarbonate Take 25 mEq by mouth 4 (four) times a week. In the afternoon.   oxyCODONE 5 MG/5ML solution Commonly known as: ROXICODONE Take 5 mLs (5 mg total) by mouth every 4 (four) hours as needed for up to 7 days for severe pain or breakthrough pain.   TART CHERRY ADVANCED PO Take 60 mLs by mouth every other day.        Follow-up Information    Piscoya, Jacqulyn Bath, MD. Schedule an appointment as soon as possible for a visit in 1 week(s).   Specialty: General Surgery Why: s/p ex lap.Marland KitchenMarland KitchenMarland KitchenCan see Thedore Mins PA for staple removal Contact information: [address] [place] [address] [phone]            Time spent on discharge management including discussion of hospital course, clinical condition, outpatient instructions, prescriptions, and follow up with the patient and members of the medical team: >30 minutes  -- Edison Simon , PA-C [facility]  11/20/2019, 10:12 AM [phone] M-F: 7am - 4pm

## 2019-11-21 NOTE — Progress Notes (Signed)
11/21/2019 11:46 AM  [name] to be D/C'd Home per MD order.  Discussed prescriptions and follow up appointments with the patient. Prescriptions given to patient, medication list explained in detail. Pt verbalized understanding.  Allergies as of 11/21/2019   No Known Allergies     Medication List    TAKE these medications   APPLE CIDER VINEGAR PO Take 30-45 mLs by mouth every other day. Notes to patient: According to home schedule   aspirin 325 MG EC tablet Take 325 mg by mouth every other day. In the morning Notes to patient: Morning 1/17   Digox 0.25 MG tablet Generic drug: digoxin Take 0.25 mg by mouth daily. Notes to patient: Morning 1/17   furosemide 80 MG tablet Commonly known as: LASIX Take 80 mg by mouth 2 (two) times daily. Morning & late afternoon Notes to patient: Afternoon 1/16   ibuprofen 100 MG/5ML suspension Commonly known as: ADVIL Take 30 mLs (600 mg total) by mouth every 6 (six) hours as needed for mild pain or moderate pain. Notes to patient: As needed   Klor-Con/EF 25 MEQ disintegrating tablet Generic drug: potassium bicarbonate Take 25 mEq by mouth 4 (four) times a week. In the afternoon. Notes to patient: According to home schedule   oxyCODONE 5 MG/5ML solution Commonly known as: ROXICODONE Take 5 mLs (5 mg total) by mouth every 4 (four) hours as needed for up to 7 days for severe pain or breakthrough pain. Notes to patient: As needed   TART CHERRY ADVANCED PO Take 60 mLs by mouth every other day. Notes to patient: According to home schedule       Vitals:   11/20/19 2015 11/21/19 0601  BP: (!) 143/81 125/79  Pulse: 74 73  Resp: 16 14  Temp: 97.6 F (36.4 C) 98.4 F (36.9 C)  SpO2: 100% 96%    Skin clean, dry and intact without evidence of skin break down, no evidence of skin tears noted. IV catheter discontinued intact. Site without signs and symptoms of complications. Dressing and pressure applied. Pt denies pain at this time.  No complaints noted.  An After Visit Summary was printed and given to the patient. Patient escorted via [place], and D/C home via private auto.  Dola Argyle

## 2019-11-21 NOTE — Final Progress Note (Signed)
[facility] Day(s): 4.   Post op day(s): 4 Days Post-Op.   Interval History: Patient seen and examined, no acute events or new complaints overnight. Patient reports having some gas pain that improves when he goes to the bathroom. Report passing gas and tolerating diet. Denies nausea or vomiting.   Vital signs in last 24 hours: [min-max] current  Temp:  [97.6 F (36.4 C)-98.4 F (36.9 C)] 98.4 F (36.9 C) (01/16 0601) Pulse Rate:  [69-74] 73 (01/16 0601) Resp:  [14-20] 14 (01/16 0601) BP: (125-143)/(79-89) 125/79 (01/16 0601) SpO2:  [96 %-100 %] 96 % (01/16 0601)     Height: 5\' 9"  (175.3 cm) Weight: 102.4 kg BMI (Calculated): 33.32   Physical Exam:  Constitutional: alert, cooperative and no distress  Respiratory: breathing non-labored at rest  Cardiovascular: regular rate and sinus rhythm  Gastrointestinal: soft, non-tender, and non-distended. Wound is dry and clean.   Labs:  CBC Latest Ref Rng & Units 11/18/2019 11/13/2019 09/09/2019  WBC 4.0 - 10.5 K/uL 6.8 3.1(L) 3.8(L)  Hemoglobin 13.0 - 17.0 g/dL 10.5(L) 11.5(L) 10.1(L)  Hematocrit 39.0 - 52.0 % 34.2(L) 37.6(L) 32.2(L)  Platelets 150 - 400 K/uL 142(L) 173 168   CMP Latest Ref Rng & Units 11/20/2019 11/19/2019 11/18/2019  Glucose 70 - 99 mg/dL 97 95 105(H)  BUN 8 - 23 mg/dL 19 27(H) 25(H)  Creatinine 0.61 - 1.24 mg/dL 1.67(H) 2.29(H) 2.60(H)  Sodium 135 - 145 mmol/L 138 139 137  Potassium 3.5 - 5.1 mmol/L 3.6 3.6 3.8  Chloride 98 - 111 mmol/L 114(H) 106 104  CO2 22 - 32 mmol/L 20(L) 23 24  Calcium 8.9 - 10.3 mg/dL 8.4(L) 8.4(L) 8.1(L)  Total Protein 6.5 - 8.1 g/dL - - -  Total Bilirubin 0.3 - 1.2 mg/dL - - -  Alkaline Phos 38 - 126 U/L - - -  AST 15 - 41 U/L - - -  ALT 0 - 44 U/L - - -    Imaging studies: No new pertinent imaging studies   Assessment/Plan:  64 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 Patient without any deterioration since last  evaluation yesterday. Tolerating diet. Pain controlled. Passing gas. Ambulating. Patient has appointment with Oncologist as outpatient on 12/01/2019. He will call to see if can be seen sooner. Will discharge patient home today and will be followed by Dr. Hampton Abbot as outpatient.    Arnold Long, MD

## 2019-11-24 LAB — SURGICAL PATHOLOGY

## 2019-11-27 ENCOUNTER — Other Ambulatory Visit: Payer: Self-pay

## 2019-11-27 ENCOUNTER — Ambulatory Visit (INDEPENDENT_AMBULATORY_CARE_PROVIDER_SITE_OTHER): Payer: Self-pay | Admitting: Physician Assistant

## 2019-11-27 ENCOUNTER — Encounter: Payer: Self-pay | Admitting: Physician Assistant

## 2019-11-27 VITALS — BP 101/67 | HR 67 | Temp 97.7°F | Resp 12 | Ht 69.0 in | Wt 206.0 lb

## 2019-11-27 DIAGNOSIS — Z09 Encounter for follow-up examination after completed treatment for conditions other than malignant neoplasm: Secondary | ICD-10-CM

## 2019-11-27 NOTE — Progress Notes (Signed)
[facility] POST-OP OFFICE VISIT  11/27/2019  HPI: Chris George is a 64 y.o. male 10 days s/p Laparoscopic Extended Right Colectomy, open umbilical hernia repair, partial omentectomy for colon cancer with Dr Hampton Abbot.   He reports that he is doing well Biggest complaint is diarrhea, he reports 2-3 watery stools a day. This has been unchanged since discharge. No urgency or incontinence. Has not tried anything for this No fever, chills, nuasea or emesis Tolerating PO Mobilizing well  Vital signs: BP 101/67   Pulse 67   Temp 97.7 F (36.5 C) (Temporal)   Resp 12   Ht 5\' 9"  (1.753 m)   Wt 206 lb (93.4 kg)   SpO2 99%   BMI 30.42 kg/m    Physical Exam: Constitutional:Well appearing male, NAD Abdomen: Soft, non-tender, non-distended, no rebound/guarding Skin: Laparotomy and laparoscopic incisions are healing well with staples, no erythema or drianage  Assessment/Plan: This is a 64 y.o. male  10 days s/p Laparoscopic Extended Right Colectomy, open umbilical hernia repair, partial omentectomy for colon cancer    - pain control prn  - staples removed; steri-strips placed  - imodium prn for diarrhea   - reviewed post-op care  - follow up with oncology on 01/26  - rtc in 2 weeks  -- Edison Simon, PA-C [facility] 11/27/2019, 10:35 AM [phone] M-F: 7am - 4pm

## 2019-11-27 NOTE — Patient Instructions (Signed)
Patient may continue to take showers, but refrain from submerging the wound in water. Patient may try over the counter Imodium to help with loose stools. Patient is to refrain from any heavy lifting of no more than 10 lbs or more.   Laparoscopic Colectomy, Care After This sheet gives you information about how to care for yourself after your procedure. Your health care provider may also give you more specific instructions. If you have problems or questions, contact your health care provider. What can I expect after the procedure? After your procedure, it is common to have the following:  Pain in your abdomen, especially in the incision areas. You will be given medicine to control the pain.  Tiredness. This is a normal part of the recovery process. Your energy level will return to normal over the next several weeks.  Changes in your bowel movements, such as constipation or needing to go more often. Talk with your health care provider about how to manage this. Follow these instructions at home: Medicines  Take over-the-counter and prescription medicines only as told by your health care provider.  Do not drive or use heavy machinery while taking prescription pain medicine.  Do not drink alcohol while taking prescription pain medicine.  If you were prescribed an antibiotic medicine, use it as told by your health care provider. Do not stop using the antibiotic even if you start to feel better. Incision care   Follow instructions from your health care provider about how to take care of your incision areas. Make sure you: ? Keep your incisions clean and dry. ? Wash your hands with soap and water before and after applying medicine to the areas, and before and after changing your bandage (dressing). If soap and water are not available, use hand sanitizer. ? Change your dressing as told by your health care provider. ? Leave stitches (sutures), skin glue, or adhesive strips in place. These skin  closures may need to stay in place for 2 weeks or longer. If adhesive strip edges start to loosen and curl up, you may trim the loose edges. Do not remove adhesive strips completely unless your health care provider tells you to do that.  Do not wear tight clothing over the incisions. Tight clothing may rub and irritate the incision areas, which may cause the incisions to open.  Do not take baths, swim, or use a hot tub until your health care provider approves. Ask your health care provider if you can take showers. You may only be allowed to take sponge baths for bathing.  Check your incision area every day for signs of infection. Check for: ? More redness, swelling, or pain. ? More fluid or blood. ? Warmth. ? Pus or a bad smell. Activity  Avoid lifting anything that is heavier than 10 lb (4.5 kg) for 2 weeks or until your health care provider says it is okay.  You may resume normal activities as told by your health care provider. Ask your health care provider what activities are safe for you.  Take rest breaks during the day as needed. Eating and drinking  Follow instructions from your health care provider about what you can eat after surgery.  To prevent or treat constipation while you are taking prescription pain medicine, your health care provider may recommend that you: ? Drink enough fluid to keep your urine clear or pale yellow. ? Take over-the-counter or prescription medicines. ? Eat foods that are high in fiber, such as fresh fruits and vegetables,  whole grains, and beans. ? Limit foods that are high in fat and processed sugars, such as fried and sweet foods. General instructions  Ask your health care provider when you will need an appointment to get your sutures or staples removed.  Keep all follow-up visits as told by your health care provider. This is important. Contact a health care provider if:  You have more redness, swelling, or pain around your incisions.  You have  more fluid or blood coming from the incisions.  Your incisions feel warm to the touch.  You have pus or a bad smell coming from your incisions or your dressing.  You have a fever.  You have an incision that breaks open (edges not staying together) after sutures or staples have been removed. Get help right away if:  You develop a rash.  You have chest pain or difficulty breathing.  You have pain or swelling in your legs.  You feel light-headed or you faint.  Your abdomen swells (becomes distended).  You have nausea or vomiting.  You have blood in your stool (feces). This information is not intended to replace advice given to you by your health care provider. Make sure you discuss any questions you have with your health care provider. Document Revised: 07/11/2018 Document Reviewed: 07/23/2016 Elsevier Patient Education  [place].

## 2019-11-28 NOTE — Progress Notes (Signed)
Chris George  [phone] [phone]  ID: PASHA BROAD OB: [birth year]  MR#: [id number]  MCE#:[id number]  Patient Care Team: Sofie Hartigan, MD as PCP - General (Family Medicine)  CHIEF COMPLAINT: Stage IIIc colon cancer, smoldering myeloma.  INTERVAL HISTORY: Patient returns to clinic today for further evaluation, discussion of his final pathology results, and treatment planning.  He continues to have mild abdominal pain at his surgical incision, but otherwise feels well.  He does not complain of peripheral neuropathy today.  He has no other neurological complaints. He does not complain of weakness or fatigue today.  He has a good appetite and denies weight loss.  He has no chest pain, shortness of breath, cough, or hemoptysis.  He denies any nausea, vomiting, constipation, or diarrhea.  He has no melena or hematochezia.  He has no urinary complaints.  Patient offers no further specific complaints today.  REVIEW OF SYSTEMS:   Review of Systems  Constitutional: Negative.  Negative for fever, malaise/fatigue and weight loss.  Respiratory: Negative.  Negative for cough, hemoptysis and shortness of breath.   Cardiovascular: Negative.  Negative for chest pain and leg swelling.  Gastrointestinal: Negative.  Negative for abdominal pain, blood in stool and melena.  Genitourinary: Negative.  Negative for hematuria.  Musculoskeletal: Negative.  Negative for back pain.  Skin: Negative.  Negative for rash.  Neurological: Negative.  Negative for dizziness, focal weakness, weakness and headaches.  Psychiatric/Behavioral: The patient is nervous/anxious.     As per HPI. Otherwise, a complete review of systems is negative.  PAST MEDICAL HISTORY: Past Medical History:  Diagnosis Date  . A-fib ([place])   . Anemia   . Arthritis    ankles  . Cancer ([place])    multiple myeloma  . 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[place]) 10/26/2019  . Cardiomyopathy ([place])   . CHF  (congestive heart failure) ([place])   . Depression   . Dysrhythmia    atrial fib  . HLD (hyperlipidemia)   . Hypercholesteremia   . Hypertension   . Moderate tricuspid insufficiency   . Multiple myeloma ([place])    not being treated right now per pt 11/11/19  . Pneumonia   . Sleep apnea    No CPAP    PAST SURGICAL HISTORY: Past Surgical History:  Procedure Laterality Date  . ATRIAL ABLATION SURGERY    . CARDIAC SURGERY     A-Fib Ablations  . COLONOSCOPY WITH PROPOFOL N/A 10/26/2019   Procedure: COLONOSCOPY WITH BIOPSY;  Surgeon: Lucilla Lame, MD;  Location: [place];  Service: Endoscopy;  Laterality: N/A;  . ESOPHAGOGASTRODUODENOSCOPY (EGD) WITH PROPOFOL N/A 10/26/2019   Procedure: ESOPHAGOGASTRODUODENOSCOPY (EGD) WITH BIOPSY;  Surgeon: Lucilla Lame, MD;  Location: [place];  Service: Endoscopy;  Laterality: N/A;  sleep apnea  . LAPAROSCOPIC PARTIAL COLECTOMY Right 11/17/2019   Procedure: LAPAROSCOPIC PARTIAL COLECTOMY RIGHT EXTENDED;  Surgeon: Olean Ree, MD;  Location: [facility];  Service: General;  Laterality: Right;    FAMILY HISTORY: Family History  Problem Relation Age of Onset  . Healthy Mother     ADVANCED DIRECTIVES (Y/N):  N  HEALTH MAINTENANCE: Social History   Tobacco Use  . Smoking status: Never Smoker  . Smokeless tobacco: Never Used  Substance Use Topics  . Alcohol use: Not Currently  . Drug use: Never     Colonoscopy:  PAP:  Bone density:  Lipid panel:  No Known Allergies  Current Outpatient Medications  Medication Sig Dispense Refill  . APPLE  CIDER VINEGAR PO Take 30-45 mLs by mouth every other day.     Marland Kitchen aspirin 325 MG EC tablet Take 325 mg by mouth every other day. In the morning    . digoxin (DIGOX) 0.25 MG tablet Take 0.25 mg by mouth daily.     . furosemide (LASIX) 80 MG tablet Take 80 mg by mouth 2 (two) times daily. Morning & late afternoon    . Misc Natural Products (TART CHERRY ADVANCED PO) Take 60 mLs by mouth  every other day.    . potassium bicarbonate (KLOR-CON/EF) 25 MEQ disintegrating tablet Take 25 mEq by mouth 4 (four) times a week. In the afternoon.    Marland Kitchen ibuprofen (ADVIL) 100 MG/5ML suspension Take 30 mLs (600 mg total) by mouth every 6 (six) hours as needed for mild pain or moderate pain. (Patient not taking: Reported on 12/01/2019) 473 mL 1  . lidocaine-prilocaine (EMLA) cream Apply to affected area once 30 g 3  . ondansetron (ZOFRAN) 8 MG tablet Take 1 tablet (8 mg total) by mouth 2 (two) times daily as needed for refractory nausea / vomiting. 60 tablet 2  . prochlorperazine (COMPAZINE) 10 MG tablet Take 1 tablet (10 mg total) by mouth every 6 (six) hours as needed (Nausea or vomiting). 60 tablet 2   No current facility-administered medications for this visit.    OBJECTIVE: Vitals:   12/01/19 0958  BP: 125/79  Pulse: (!) 52  Resp: 18  Temp: (!) 96.7 F (35.9 C)     Body mass index is 31.01 kg/m.    ECOG FS:0 - Asymptomatic  General: Well-developed, well-nourished, no acute distress. Eyes: Pink conjunctiva, anicteric sclera. HEENT: Normocephalic, moist mucous membranes. Lungs: No audible wheezing or coughing. Heart: Regular rate and rhythm. Abdomen: Soft, nontender, no obvious distention. Musculoskeletal: No edema, cyanosis, or clubbing. Neuro: Alert, answering all questions appropriately. Cranial nerves grossly intact. Skin: No rashes or petechiae noted. Psych: Normal affect.  LAB RESULTS:  Lab Results  Component Value Date   NA 138 11/20/2019   K 3.6 11/20/2019   CL 114 (H) 11/20/2019   CO2 20 (L) 11/20/2019   GLUCOSE 97 11/20/2019   BUN 19 11/20/2019   CREATININE 1.67 (H) 11/20/2019   CALCIUM 8.4 (L) 11/20/2019   PROT 7.8 11/13/2019   ALBUMIN 3.5 11/13/2019   AST 42 (H) 11/13/2019   ALT 30 11/13/2019   ALKPHOS 133 (H) 11/13/2019   BILITOT 0.9 11/13/2019   GFRNONAA 43 (L) 11/20/2019   GFRAA 50 (L) 11/20/2019    Lab Results  Component Value Date   WBC 6.8  11/18/2019   NEUTROABS 5.8 11/18/2019   HGB 10.5 (L) 11/18/2019   HCT 34.2 (L) 11/18/2019   MCV 88.1 11/18/2019   PLT 142 (L) 11/18/2019   Lab Results  Component Value Date   IRON 35 (L) 09/09/2019   TIBC 357 09/09/2019   IRONPCTSAT 10 (L) 09/09/2019   Lab Results  Component Value Date   FERRITIN 11 (L) 09/09/2019     STUDIES: CT Chest W Contrast  Result Date: 11/11/2019 CLINICAL DATA:  Recent diagnosis of colon cancer. History of multiple myeloma diagnosed in 2013. No current complaints. EXAM: CT CHEST, ABDOMEN, AND PELVIS WITH CONTRAST TECHNIQUE: Multidetector CT imaging of the chest, abdomen and pelvis was performed following the standard protocol during bolus administration of intravenous contrast. CONTRAST:  1101m OMNIPAQUE IOHEXOL 300 MG/ML  SOLN COMPARISON:  Metastatic bone survey 05/20/2019. FINDINGS: CT CHEST FINDINGS Cardiovascular: No significant atherosclerosis. There is central enlargement  of the pulmonary arteries consistent with pulmonary hypertension. There is moderate cardiomegaly without significant pericardial effusion. No acute vascular findings. The brachiocephalic and left common carotid artery share a common origin from the aortic arch. Mediastinum/Nodes: There are no enlarged mediastinal, hilar or axillary lymph nodes. The thyroid gland, trachea and esophagus demonstrate no significant findings. Lungs/Pleura: No pleural effusion or pneumothorax. There are probable tiny granulomas in the right lung on images 58 and 120 of series 4. No suspicious pulmonary nodules. Musculoskeletal/Chest wall: No chest wall mass or suspicious osseous findings. CT ABDOMEN AND PELVIS FINDINGS Hepatobiliary: There is diffuse contour irregularity of the liver with relative enlargement of the caudate and left lobes, consistent with cirrhosis. No focal hepatic lesions or abnormal enhancement identified. No evidence of gallstones, gallbladder wall thickening or biliary dilatation. Pancreas:  Unremarkable. No pancreatic ductal dilatation or surrounding inflammatory changes. Spleen: Normal in size without focal abnormality. Adrenals/Urinary Tract: Both adrenal glands appear normal. Small probable cyst in the lower pole of the left kidney. The kidneys otherwise appear normal. No evidence of urinary tract calculus or hydronephrosis. The bladder appears normal. Stomach/Bowel: The stomach, small bowel, appendix and proximal colon appear normal. There is a nonobstructing "apple-core" lesion involving the mid transverse colon which measures up to 7.7 cm on image 78/2. No other bowel lesions are identified. Vascular/Lymphatic: Prominent lymph nodes in the base of the mesentery adjacent to the colonic lesion are suspicious for nodal metastases. These measure 8 and 9 mm respectively on image 75/2. No other suspicious lymph nodes in the abdomen or pelvis. Mild aortic and branch vessel atherosclerosis. No acute vascular findings. The portal, superior mesenteric and splenic veins are patent. There are internal iliac varices in the left pelvis. Possible early recanalization of the left paraumbilical vein. Reproductive: Moderate slightly heterogeneous enlargement of the prostate gland. Other: Small periumbilical hernia containing only fat. No ascites or peritoneal nodularity. Musculoskeletal: No acute or significant osseous findings. Mild degenerative changes in the spine and both hips. IMPRESSION: 1. Large apple-core lesion involving the mid transverse colon consistent with the patient's known colon cancer. No evidence of bowel obstruction or perforation. 2. Prominent lymph nodes in the base of the mesentery adjacent to the colonic lesion, suspicious for nodal metastases. No evidence of distant metastatic disease. 3. Hepatic cirrhosis. Possible early recanalization of the left paraumbilical vein and left internal iliac varices. No other signs of portal hypertension. 4. Aortic Atherosclerosis (ICD10-I70.0).  Electronically Signed   By: Richardean Sale M.D.   On: 11/11/2019 12:07   CT Abdomen Pelvis W Contrast  Result Date: 11/11/2019 CLINICAL DATA:  Recent diagnosis of colon cancer. History of multiple myeloma diagnosed in 2013. No current complaints. EXAM: CT CHEST, ABDOMEN, AND PELVIS WITH CONTRAST TECHNIQUE: Multidetector CT imaging of the chest, abdomen and pelvis was performed following the standard protocol during bolus administration of intravenous contrast. CONTRAST:  129m OMNIPAQUE IOHEXOL 300 MG/ML  SOLN COMPARISON:  Metastatic bone survey 05/20/2019. FINDINGS: CT CHEST FINDINGS Cardiovascular: No significant atherosclerosis. There is central enlargement of the pulmonary arteries consistent with pulmonary hypertension. There is moderate cardiomegaly without significant pericardial effusion. No acute vascular findings. The brachiocephalic and left common carotid artery share a common origin from the aortic arch. Mediastinum/Nodes: There are no enlarged mediastinal, hilar or axillary lymph nodes. The thyroid gland, trachea and esophagus demonstrate no significant findings. Lungs/Pleura: No pleural effusion or pneumothorax. There are probable tiny granulomas in the right lung on images 58 and 120 of series 4. No suspicious pulmonary nodules. Musculoskeletal/Chest  wall: No chest wall mass or suspicious osseous findings. CT ABDOMEN AND PELVIS FINDINGS Hepatobiliary: There is diffuse contour irregularity of the liver with relative enlargement of the caudate and left lobes, consistent with cirrhosis. No focal hepatic lesions or abnormal enhancement identified. No evidence of gallstones, gallbladder wall thickening or biliary dilatation. Pancreas: Unremarkable. No pancreatic ductal dilatation or surrounding inflammatory changes. Spleen: Normal in size without focal abnormality. Adrenals/Urinary Tract: Both adrenal glands appear normal. Small probable cyst in the lower pole of the left kidney. The kidneys otherwise  appear normal. No evidence of urinary tract calculus or hydronephrosis. The bladder appears normal. Stomach/Bowel: The stomach, small bowel, appendix and proximal colon appear normal. There is a nonobstructing "apple-core" lesion involving the mid transverse colon which measures up to 7.7 cm on image 78/2. No other bowel lesions are identified. Vascular/Lymphatic: Prominent lymph nodes in the base of the mesentery adjacent to the colonic lesion are suspicious for nodal metastases. These measure 8 and 9 mm respectively on image 75/2. No other suspicious lymph nodes in the abdomen or pelvis. Mild aortic and branch vessel atherosclerosis. No acute vascular findings. The portal, superior mesenteric and splenic veins are patent. There are internal iliac varices in the left pelvis. Possible early recanalization of the left paraumbilical vein. Reproductive: Moderate slightly heterogeneous enlargement of the prostate gland. Other: Small periumbilical hernia containing only fat. No ascites or peritoneal nodularity. Musculoskeletal: No acute or significant osseous findings. Mild degenerative changes in the spine and both hips. IMPRESSION: 1. Large apple-core lesion involving the mid transverse colon consistent with the patient's known colon cancer. No evidence of bowel obstruction or perforation. 2. Prominent lymph nodes in the base of the mesentery adjacent to the colonic lesion, suspicious for nodal metastases. No evidence of distant metastatic disease. 3. Hepatic cirrhosis. Possible early recanalization of the left paraumbilical vein and left internal iliac varices. No other signs of portal hypertension. 4. Aortic Atherosclerosis (ICD10-I70.0). Electronically Signed   By: Richardean Sale M.D.   On: 11/11/2019 12:07    ASSESSMENT: Stage IIIc colon cancer, smoldering myeloma.  PLAN:    1.  Stage IIIc colon cancer: Imaging and final pathology reviewed independently confirming stage of disease.  Patient has agreed to  adjuvant FOLFOX chemotherapy every 2 weeks for a total of 12 cycles.  He will require port placement prior to initiating treatment.  Continue follow-up with surgery as scheduled.  Return to clinic on December 23, 2019 to initiate cycle 1 of 12 of adjuvant FOLFOX. 2. Smoldering myeloma: Patient was initially diagnosed and treated in [place], [place] and treated with single agent Velcade in approximately 2013.  He was evaluated in clinic in July 2014 when he declined any maintenance treatment or referral for bone marrow transplant.  He was subsequently lost to follow-up.  His most recent SPEP revealed an M spike of 2.0 with an IgG predominance of 2949.  These are unchanged from 3 months prior.  He also has a suppressed IgA level.  Kappa/lambda light chain ratio has trended up slightly to 2.17.  He has mild renal insufficiency with a creatinine of 1.67, but no other evidence of endorgan damage.  Metastatic bone survey on May 20, 2019 reviewed independently with no obvious bony lesions.  Bone marrow biopsy completed on May 28, 2019 revealed only 10 to 15% plasma cells with no clonality reported.  Cytogenetics were also reported as normal.  No intervention is needed at this time.  Continue to monitor closely. 3.  Iron deficiency anemia:  Likely secondary to recently diagnosed colon cancer.  Patient's hemoglobin has improved, but his iron stores remain reduced.  He last received IV Feraheme on October 06, 2019.   4.  Renal insufficiency: Patient's most recent creatinine is 1.67.  Monitor. 5.  Peripheral neuropathy: Chronic and unchanged.  Patient does not complain of this today.   Patient expressed understanding and was in agreement with this plan. He also understands that He can call clinic at any time with any questions, concerns, or complaints.    Lloyd Huger, MD   12/02/2019 1:46 PM

## 2019-12-01 ENCOUNTER — Encounter: Payer: Self-pay | Admitting: Oncology

## 2019-12-01 ENCOUNTER — Encounter: Payer: Self-pay | Admitting: Emergency Medicine

## 2019-12-01 ENCOUNTER — Telehealth: Payer: Self-pay

## 2019-12-01 ENCOUNTER — Inpatient Hospital Stay: Payer: BC Managed Care – PPO | Attending: Oncology | Admitting: Oncology

## 2019-12-01 ENCOUNTER — Telehealth: Payer: Self-pay | Admitting: Emergency Medicine

## 2019-12-01 ENCOUNTER — Other Ambulatory Visit: Payer: Self-pay

## 2019-12-01 VITALS — BP 125/79 | HR 52 | Temp 96.7°F | Resp 18 | Wt 210.0 lb

## 2019-12-01 DIAGNOSIS — D509 Iron deficiency anemia, unspecified: Secondary | ICD-10-CM | POA: Insufficient documentation

## 2019-12-01 DIAGNOSIS — N289 Disorder of kidney and ureter, unspecified: Secondary | ICD-10-CM | POA: Insufficient documentation

## 2019-12-01 DIAGNOSIS — G629 Polyneuropathy, unspecified: Secondary | ICD-10-CM | POA: Insufficient documentation

## 2019-12-01 DIAGNOSIS — R109 Unspecified abdominal pain: Secondary | ICD-10-CM | POA: Insufficient documentation

## 2019-12-01 DIAGNOSIS — C9000 Multiple myeloma not having achieved remission: Secondary | ICD-10-CM | POA: Diagnosis not present

## 2019-12-01 DIAGNOSIS — C184 Malignant neoplasm of transverse colon: Secondary | ICD-10-CM | POA: Diagnosis present

## 2019-12-01 NOTE — Progress Notes (Signed)
START ON PATHWAY REGIMEN - Colorectal     A cycle is every 14 days:     Oxaliplatin      Leucovorin      Fluorouracil      Fluorouracil   **Always confirm dose/schedule in your pharmacy ordering system**  Patient Characteristics: Postoperative without Neoadjuvant Therapy (Pathologic Staging), Colon, Stage III, High Risk (pT4 or pN2) Tumor Location: Colon Therapeutic Status: Postoperative without Neoadjuvant Therapy (Pathologic Staging) AJCC M Category: cM0 AJCC T Category: pT3 AJCC N Category: pN2b AJCC 8 Stage Grouping: IIIC Intent of Therapy: Curative Intent, Discussed with Patient

## 2019-12-01 NOTE — Telephone Encounter (Signed)
Patient came to office asking for return to work note for Thursday 12/03/19. Restrictions: no heavy lifting more than 10lbs until 12/29/19.  Copy of work note provided to patient and copy faxed to [facility] [phone] at this time.

## 2019-12-01 NOTE — Progress Notes (Signed)
Patient had colon surgery 11/17/19. He needs clearance to go back to work but thinks he may need to get that from the surgeon

## 2019-12-01 NOTE — Telephone Encounter (Signed)
-----   Message from [place], [place] sent at 11/04/2019  1:32 PM EST ----- Check to see how pt is doing after colorectal surgery. Pt needs H pylori treatment. Pt wanted to wait until after his surgery to start antibiotic.

## 2019-12-01 NOTE — Telephone Encounter (Signed)
Tried contacting pt to see if he was ready to start his treatment for H pylori. Unable to leave a voicemail due to mailbox being full.

## 2019-12-02 MED ORDER — ONDANSETRON HCL 8 MG PO TABS: 8.0000 mg | ORAL_TABLET | Freq: Two times a day (BID) | ORAL | 2 refills | Status: DC | PRN

## 2019-12-02 MED ORDER — PROCHLORPERAZINE MALEATE 10 MG PO TABS: 10.0000 mg | ORAL_TABLET | Freq: Four times a day (QID) | ORAL | 2 refills | Status: DC | PRN

## 2019-12-02 MED ORDER — LIDOCAINE-PRILOCAINE 2.5-2.5 % EX CREA: TOPICAL_CREAM | CUTANEOUS | 3 refills | Status: DC

## 2019-12-03 ENCOUNTER — Encounter: Payer: BC Managed Care – PPO | Admitting: Physician Assistant

## 2019-12-11 ENCOUNTER — Other Ambulatory Visit: Payer: Self-pay

## 2019-12-11 ENCOUNTER — Ambulatory Visit (INDEPENDENT_AMBULATORY_CARE_PROVIDER_SITE_OTHER): Payer: Self-pay | Admitting: Physician Assistant

## 2019-12-11 ENCOUNTER — Encounter: Payer: Self-pay | Admitting: Physician Assistant

## 2019-12-11 VITALS — BP 127/85 | HR 75 | Temp 96.1°F | Resp 14 | Ht 69.0 in | Wt 213.0 lb

## 2019-12-11 DIAGNOSIS — Z09 Encounter for follow-up examination after completed treatment for conditions other than malignant neoplasm: Secondary | ICD-10-CM

## 2019-12-11 DIAGNOSIS — C184 Malignant neoplasm of transverse colon: Secondary | ICD-10-CM

## 2019-12-11 NOTE — Patient Instructions (Addendum)
Chris Ket, PA-C discussed Port-A-Catheter placement surgery for patient's chemo treatment(s) at today's visit.    Patient will give our office a call when he decides to move forward with [facility] Placement surgery after discussing plan with Dr.Finnegan.   Patient was informed that he may gradually move back into routine activity such as riding his bike, but patient will need to refrain from any heavy lifting or weighted items.

## 2019-12-11 NOTE — Progress Notes (Signed)
[facility] POST-OP OFFICE VISIT  12/11/2019  HPI: Chris George is a 64 y.o. male ~1 month s/p Laparoscopic ExtendedRight Colectomy, open umbilical hernia repair, partial omentectomy for colon cancer with Dr Hampton Abbot.  From a surgical standpoint he is doing well. He reports that he is having no issues with pain, nausea, or emesis. His appetite is normal. His diarrhea has subsided and he has stopped the imodium. Continues to be active. No new complaints.   Additionally, He was seen by Dr Grayland Ormond, MD (Oncology) on 01/26 and diagnosed with Stage IIIc colon cancer. It was recommended that he undergo adjuvant FOLFOX chemotherapy every 2 weeks for a total of 12 cycles, which he is in agreement. He also presents today for discussion for port placement. He plans to have another discussion with Dr Grayland Ormond early next week but is almost certain he will proceed with chemotherapy.    Vital signs: BP 127/85   Pulse 75   Temp (!) 96.1 F (35.6 C) (Temporal)   Resp 14   Ht 5\' 9"  (1.753 m)   Wt 213 lb (96.6 kg)   SpO2 96%   BMI 31.45 kg/m    Physical Exam: Constitutional: Well appearing male, NAD Abdomen: Soft, non-tender,non-distended, no rebound/guarding Skin: Laparotomy incision is well healed, no erythema or drainage  Assessment/Plan: This is a 64 y.o. male ~1 month s/p Laparoscopic ExtendedRight Colectomy, open umbilical hernia repair, partial omentectomy for colon cancer   - We will be happy to place a port-a-catheter in this patient. He would like to first follow up one more time with Dr Grayland Ormond to discuss chemotherapy as he would like to start this sometime after February 17th as previously recommend. I did discuss the procedure in detail with the patient including risks, benefits, and alternatives. All his questions and concerns were addressed and he was in agreement with proceeding once he is ready and date of chemotherapy is establish. He will call our office to  schedule this with Dr Hampton Abbot once he is ready.     - Otherwise, he can begin to gradually increase activity level.   - Stop imodium as diarrhea resolved  - Follow up for port placement once ready   -- Edison Simon, PA-C [facility] 12/11/2019, 10:03 AM [phone] M-F: 7am - 4pm

## 2019-12-11 NOTE — H&P (View-Only) (Signed)
[facility] POST-OP OFFICE VISIT  12/11/2019  HPI: Chris George is a 64 y.o. male ~1 month s/p Laparoscopic ExtendedRight Colectomy, open umbilical hernia repair, partial omentectomy for colon cancer with Dr Hampton Abbot.  From a surgical standpoint he is doing well. He reports that he is having no issues with pain, nausea, or emesis. His appetite is normal. His diarrhea has subsided and he has stopped the imodium. Continues to be active. No new complaints.   Additionally, He was seen by Dr Grayland Ormond, MD (Oncology) on 01/26 and diagnosed with Stage IIIc colon cancer. It was recommended that he undergo adjuvant FOLFOX chemotherapy every 2 weeks for a total of 12 cycles, which he is in agreement. He also presents today for discussion for port placement. He plans to have another discussion with Dr Grayland Ormond early next week but is almost certain he will proceed with chemotherapy.    Vital signs: BP 127/85   Pulse 75   Temp (!) 96.1 F (35.6 C) (Temporal)   Resp 14   Ht 5\' 9"  (1.753 m)   Wt 213 lb (96.6 kg)   SpO2 96%   BMI 31.45 kg/m    Physical Exam: Constitutional: Well appearing male, NAD Abdomen: Soft, non-tender,non-distended, no rebound/guarding Skin: Laparotomy incision is well healed, no erythema or drainage  Assessment/Plan: This is a 64 y.o. male ~1 month s/p Laparoscopic ExtendedRight Colectomy, open umbilical hernia repair, partial omentectomy for colon cancer   - We will be happy to place a port-a-catheter in this patient. He would like to first follow up one more time with Dr Grayland Ormond to discuss chemotherapy as he would like to start this sometime after February 17th as previously recommend. I did discuss the procedure in detail with the patient including risks, benefits, and alternatives. All his questions and concerns were addressed and he was in agreement with proceeding once he is ready and date of chemotherapy is establish. He will call our office to  schedule this with Dr Hampton Abbot once he is ready.     - Otherwise, he can begin to gradually increase activity level.   - Stop imodium as diarrhea resolved  - Follow up for port placement once ready   -- Edison Simon, PA-C [facility] 12/11/2019, 10:03 AM [phone] M-F: 7am - 4pm

## 2019-12-15 ENCOUNTER — Inpatient Hospital Stay: Payer: BC Managed Care – PPO | Admitting: Nurse Practitioner

## 2019-12-15 ENCOUNTER — Inpatient Hospital Stay: Payer: BC Managed Care – PPO

## 2019-12-17 ENCOUNTER — Inpatient Hospital Stay: Payer: BC Managed Care – PPO | Attending: Oncology | Admitting: Oncology

## 2019-12-17 ENCOUNTER — Other Ambulatory Visit: Payer: Self-pay

## 2019-12-17 DIAGNOSIS — C184 Malignant neoplasm of transverse colon: Secondary | ICD-10-CM

## 2019-12-17 DIAGNOSIS — C9000 Multiple myeloma not having achieved remission: Secondary | ICD-10-CM | POA: Insufficient documentation

## 2019-12-17 DIAGNOSIS — Z5111 Encounter for antineoplastic chemotherapy: Secondary | ICD-10-CM | POA: Insufficient documentation

## 2019-12-17 NOTE — Patient Instructions (Signed)
Oxaliplatin Injection What is this medicine? OXALIPLATIN (ox AL i PLA tin) is a chemotherapy drug. It targets fast dividing cells, like cancer cells, and causes these cells to die. This medicine is used to treat cancers of the colon and rectum, and many other cancers. This medicine may be used for other purposes; ask your health care provider or pharmacist if you have questions. COMMON BRAND NAME(S): Eloxatin What should I tell my health care provider before I take this medicine? They need to know if you have any of these conditions:  heart disease  history of irregular heartbeat  liver disease  low blood counts, like white cells, platelets, or red blood cells  lung or breathing disease, like asthma  take medicines that treat or prevent blood clots  tingling of the fingers or toes, or other nerve disorder  an unusual or allergic reaction to oxaliplatin, other chemotherapy, other medicines, foods, dyes, or preservatives  pregnant or trying to get pregnant  breast-feeding How should I use this medicine? This drug is given as an infusion into a vein. It is administered in a hospital or clinic by a specially trained health care professional. Talk to your pediatrician regarding the use of this medicine in children. Special care may be needed. Overdosage: If you think you have taken too much of this medicine contact a poison control center or emergency room at once. NOTE: This medicine is only for you. Do not share this medicine with others. What if I miss a dose? It is important not to miss a dose. Call your doctor or health care professional if you are unable to keep an appointment. What may interact with this medicine? Do not take this medicine with any of the following medications:  cisapride  dronedarone  pimozide  thioridazine This medicine may also interact with the following medications:  aspirin and aspirin-like medicines  certain medicines that treat or prevent  blood clots like warfarin, apixaban, dabigatran, and rivaroxaban  cisplatin  cyclosporine  diuretics  medicines for infection like acyclovir, adefovir, amphotericin B, bacitracin, cidofovir, foscarnet, ganciclovir, gentamicin, pentamidine, vancomycin  NSAIDs, medicines for pain and inflammation, like ibuprofen or naproxen  other medicines that prolong the QT interval (an abnormal heart rhythm)  pamidronate  zoledronic acid This list may not describe all possible interactions. Give your health care provider a list of all the medicines, herbs, non-prescription drugs, or dietary supplements you use. Also tell them if you smoke, drink alcohol, or use illegal drugs. Some items may interact with your medicine. What should I watch for while using this medicine? Your condition will be monitored carefully while you are receiving this medicine. You may need blood work done while you are taking this medicine. This medicine may make you feel generally unwell. This is not uncommon as chemotherapy can affect healthy cells as well as cancer cells. Report any side effects. Continue your course of treatment even though you feel ill unless your healthcare professional tells you to stop. This medicine can make you more sensitive to cold. Do not drink cold drinks or use ice. Cover exposed skin before coming in contact with cold temperatures or cold objects. When out in cold weather wear warm clothing and cover your mouth and nose to warm the air that goes into your lungs. Tell your doctor if you get sensitive to the cold. Do not become pregnant while taking this medicine or for 9 months after stopping it. Women should inform their health care professional if they wish to become   pregnant or think they might be pregnant. Men should not father a child while taking this medicine and for 6 months after stopping it. There is potential for serious side effects to an unborn child. Talk to your health care professional  for more information. Do not breast-feed a child while taking this medicine or for 3 months after stopping it. This medicine has caused ovarian failure in some women. This medicine may make it more difficult to get pregnant. Talk to your health care professional if you are concerned about your fertility. This medicine has caused decreased sperm counts in some men. This may make it more difficult to father a child. Talk to your health care professional if you are concerned about your fertility. This medicine may increase your risk of getting an infection. Call your health care professional for advice if you get a fever, chills, or sore throat, or other symptoms of a cold or flu. Do not treat yourself. Try to avoid being around people who are sick. Avoid taking medicines that contain aspirin, acetaminophen, ibuprofen, naproxen, or ketoprofen unless instructed by your health care professional. These medicines may hide a fever. Be careful brushing or flossing your teeth or using a toothpick because you may get an infection or bleed more easily. If you have any dental work done, tell your dentist you are receiving this medicine. What side effects may I notice from receiving this medicine? Side effects that you should report to your doctor or health care professional as soon as possible:  allergic reactions like skin rash, itching or hives, swelling of the face, lips, or tongue  breathing problems  cough  low blood counts - this medicine may decrease the number of white blood cells, red blood cells, and platelets. You may be at increased risk for infections and bleeding  nausea, vomiting  pain, redness, or irritation at site where injected  pain, tingling, numbness in the hands or feet  signs and symptoms of bleeding such as bloody or black, tarry stools; red or dark brown urine; spitting up blood or brown material that looks like coffee grounds; red spots on the skin; unusual bruising or bleeding  from the eyes, gums, or nose  signs and symptoms of a dangerous change in heartbeat or heart rhythm like chest pain; dizziness; fast, irregular heartbeat; palpitations; feeling faint or lightheaded; falls  signs and symptoms of infection like fever; chills; cough; sore throat; pain or trouble passing urine  signs and symptoms of liver injury like dark yellow or brown urine; general ill feeling or flu-like symptoms; light-colored stools; loss of appetite; nausea; right upper belly pain; unusually weak or tired; yellowing of the eyes or skin  signs and symptoms of low red blood cells or anemia such as unusually weak or tired; feeling faint or lightheaded; falls  signs and symptoms of muscle injury like dark urine; trouble passing urine or change in the amount of urine; unusually weak or tired; muscle pain; back pain Side effects that usually do not require medical attention (report to your doctor or health care professional if they continue or are bothersome):  changes in taste  diarrhea  gas  hair loss  loss of appetite  mouth sores This list may not describe all possible side effects. Call your doctor for medical advice about side effects. You may report side effects to FDA at 1-800-FDA-1088. Where should I keep my medicine? This drug is given in a hospital or clinic and will not be stored at home. NOTE:   This sheet is a summary. It may not cover all possible information. If you have questions about this medicine, talk to your doctor, pharmacist, or health care provider.  2020 [employer] Standard (2019-03-11 12:20:35) Fluorouracil, 5-FU injection What is this medicine? FLUOROURACIL, 5-FU (flure oh YOOR a sil) is a chemotherapy drug. It slows the growth of cancer cells. This medicine is used to treat many types of cancer like breast cancer, colon or rectal cancer, pancreatic cancer, and stomach cancer. This medicine may be used for other purposes; ask your health care provider or  pharmacist if you have questions. COMMON BRAND NAME(S): Adrucil What should I tell my health care provider before I take this medicine? They need to know if you have any of these conditions:  blood disorders  dihydropyrimidine dehydrogenase (DPD) deficiency  infection (especially a virus infection such as chickenpox, cold sores, or herpes)  kidney disease  liver disease  malnourished, poor nutrition  recent or ongoing radiation therapy  an unusual or allergic reaction to fluorouracil, other chemotherapy, other medicines, foods, dyes, or preservatives  pregnant or trying to get pregnant  breast-feeding How should I use this medicine? This drug is given as an infusion or injection into a vein. It is administered in a hospital or clinic by a specially trained health care professional. Talk to your pediatrician regarding the use of this medicine in children. Special care may be needed. Overdosage: If you think you have taken too much of this medicine contact a poison control center or emergency room at once. NOTE: This medicine is only for you. Do not share this medicine with others. What if I miss a dose? It is important not to miss your dose. Call your doctor or health care professional if you are unable to keep an appointment. What may interact with this medicine?  allopurinol  cimetidine  dapsone  digoxin  hydroxyurea  leucovorin  levamisole  medicines for seizures like ethotoin, fosphenytoin, phenytoin  medicines to increase blood counts like filgrastim, pegfilgrastim, sargramostim  medicines that treat or prevent blood clots like warfarin, enoxaparin, and dalteparin  methotrexate  metronidazole  pyrimethamine  some other chemotherapy drugs like busulfan, cisplatin, estramustine, vinblastine  trimethoprim  trimetrexate  vaccines Talk to your doctor or health care professional before taking any of these  medicines:  acetaminophen  aspirin  ibuprofen  ketoprofen  naproxen This list may not describe all possible interactions. Give your health care provider a list of all the medicines, herbs, non-prescription drugs, or dietary supplements you use. Also tell them if you smoke, drink alcohol, or use illegal drugs. Some items may interact with your medicine. What should I watch for while using this medicine? Visit your doctor for checks on your progress. This drug may make you feel generally unwell. This is not uncommon, as chemotherapy can affect healthy cells as well as cancer cells. Report any side effects. Continue your course of treatment even though you feel ill unless your doctor tells you to stop. In some cases, you may be given additional medicines to help with side effects. Follow all directions for their use. Call your doctor or health care professional for advice if you get a fever, chills or sore throat, or other symptoms of a cold or flu. Do not treat yourself. This drug decreases your body's ability to fight infections. Try to avoid being around people who are sick. This medicine may increase your risk to bruise or bleed. Call your doctor or health care professional if you notice any   unusual bleeding. Be careful brushing and flossing your teeth or using a toothpick because you may get an infection or bleed more easily. If you have any dental work done, tell your dentist you are receiving this medicine. Avoid taking products that contain aspirin, acetaminophen, ibuprofen, naproxen, or ketoprofen unless instructed by your doctor. These medicines may hide a fever. Do not become pregnant while taking this medicine. Women should inform their doctor if they wish to become pregnant or think they might be pregnant. There is a potential for serious side effects to an unborn child. Talk to your health care professional or pharmacist for more information. Do not breast-feed an infant while taking  this medicine. Men should inform their doctor if they wish to father a child. This medicine may lower sperm counts. Do not treat diarrhea with over the counter products. Contact your doctor if you have diarrhea that lasts more than 2 days or if it is severe and watery. This medicine can make you more sensitive to the sun. Keep out of the sun. If you cannot avoid being in the sun, wear protective clothing and use sunscreen. Do not use sun lamps or tanning beds/booths. What side effects may I notice from receiving this medicine? Side effects that you should report to your doctor or health care professional as soon as possible:  allergic reactions like skin rash, itching or hives, swelling of the face, lips, or tongue  low blood counts - this medicine may decrease the number of white blood cells, red blood cells and platelets. You may be at increased risk for infections and bleeding.  signs of infection - fever or chills, cough, sore throat, pain or difficulty passing urine  signs of decreased platelets or bleeding - bruising, pinpoint red spots on the skin, black, tarry stools, blood in the urine  signs of decreased red blood cells - unusually weak or tired, fainting spells, lightheadedness  breathing problems  changes in vision  chest pain  mouth sores  nausea and vomiting  pain, swelling, redness at site where injected  pain, tingling, numbness in the hands or feet  redness, swelling, or sores on hands or feet  stomach pain  unusual bleeding Side effects that usually do not require medical attention (report to your doctor or health care professional if they continue or are bothersome):  changes in finger or toe nails  diarrhea  dry or itchy skin  hair loss  headache  loss of appetite  sensitivity of eyes to the light  stomach upset  unusually teary eyes This list may not describe all possible side effects. Call your doctor for medical advice about side effects.  You may report side effects to FDA at 1-800-FDA-1088. Where should I keep my medicine? This drug is given in a hospital or clinic and will not be stored at home. NOTE: This sheet is a summary. It may not cover all possible information. If you have questions about this medicine, talk to your doctor, pharmacist, or health care provider.  2020 [employer] Standard (2008-02-25 13:53:16) Leucovorin injection What is this medicine? LEUCOVORIN (loo koe VOR in) is used to prevent or treat the harmful effects of some medicines. This medicine is used to treat anemia caused by a low amount of folic acid in the body. It is also used with 5-fluorouracil (5-FU) to treat colon cancer. This medicine may be used for other purposes; ask your health care provider or pharmacist if you have questions. What should I tell my health care   provider before I take this medicine? They need to know if you have any of these conditions:  anemia from low levels of vitamin B-12 in the blood  an unusual or allergic reaction to leucovorin, folic acid, other medicines, foods, dyes, or preservatives  pregnant or trying to get pregnant  breast-feeding How should I use this medicine? This medicine is for injection into a muscle or into a vein. It is given by a health care professional in a hospital or clinic setting. Talk to your pediatrician regarding the use of this medicine in children. Special care may be needed. Overdosage: If you think you have taken too much of this medicine contact a poison control center or emergency room at once. NOTE: This medicine is only for you. Do not share this medicine with others. What if I miss a dose? This does not apply. What may interact with this medicine?  capecitabine  fluorouracil  phenobarbital  phenytoin  primidone  trimethoprim-sulfamethoxazole This list may not describe all possible interactions. Give your health care provider a list of all the medicines, herbs,  non-prescription drugs, or dietary supplements you use. Also tell them if you smoke, drink alcohol, or use illegal drugs. Some items may interact with your medicine. What should I watch for while using this medicine? Your condition will be monitored carefully while you are receiving this medicine. This medicine may increase the side effects of 5-fluorouracil, 5-FU. Tell your doctor or health care professional if you have diarrhea or mouth sores that do not get better or that get worse. What side effects may I notice from receiving this medicine? Side effects that you should report to your doctor or health care professional as soon as possible:  allergic reactions like skin rash, itching or hives, swelling of the face, lips, or tongue  breathing problems  fever, infection  mouth sores  unusual bleeding or bruising  unusually weak or tired Side effects that usually do not require medical attention (report to your doctor or health care professional if they continue or are bothersome):  constipation or diarrhea  loss of appetite  nausea, vomiting This list may not describe all possible side effects. Call your doctor for medical advice about side effects. You may report side effects to FDA at 1-800-FDA-1088. Where should I keep my medicine? This drug is given in a hospital or clinic and will not be stored at home. NOTE: This sheet is a summary. It may not cover all possible information. If you have questions about this medicine, talk to your doctor, pharmacist, or health care provider.  2020 [employer] Standard (2008-04-27 16:50:29)  

## 2019-12-18 ENCOUNTER — Other Ambulatory Visit: Payer: Self-pay

## 2019-12-18 ENCOUNTER — Inpatient Hospital Stay: Payer: BC Managed Care – PPO

## 2019-12-18 ENCOUNTER — Inpatient Hospital Stay: Payer: BC Managed Care – PPO | Admitting: Oncology

## 2019-12-18 NOTE — Progress Notes (Signed)
[facility]  [phone]) [phone] Fax:(336) [phone]  ID: Chris George OB: December 06, 1955  MR#: [id number]  WUX#:[id number]  Patient Care Team: Sofie Hartigan, MD as PCP - General (Family Medicine)  I connected with Chris George on 12/18/19 at  3:30 PM EST by video enabled telemedicine visit and verified that I am speaking with the correct person using two identifiers.   I discussed the limitations, risks, security and privacy concerns of performing an evaluation and management service by telemedicine and the availability of in-person appointments. I also discussed with the patient that there may be a patient responsible charge related to this service. The patient expressed understanding and agreed to proceed.   Other persons participating in the visit and their role in the encounter: Patient, MD.  Patient's location: Home. Provider's location: Clinic.  CHIEF COMPLAINT: Stage IIIc colon cancer, smoldering myeloma.  INTERVAL HISTORY: Patient requested video enabled telemedicine visit for further evaluation and discussion of his adjuvant chemotherapy.  He is anxious, but otherwise feels well.  He no longer has abdominal pain.  He does not complain of peripheral neuropathy today.  He has no other neurological complaints. He does not complain of weakness or fatigue today.  He has a good appetite and denies weight loss.  He has no chest pain, shortness of breath, cough, or hemoptysis.  He denies any nausea, vomiting, constipation, or diarrhea.  He has no melena or hematochezia.  He has no urinary complaints.  Patient offers no specific complaints today.  REVIEW OF SYSTEMS:   Review of Systems  Constitutional: Negative.  Negative for fever, malaise/fatigue and weight loss.  Respiratory: Negative.  Negative for cough, hemoptysis and shortness of breath.   Cardiovascular: Negative.  Negative for chest pain and leg swelling.  Gastrointestinal: Negative.  Negative for  abdominal pain, blood in stool and melena.  Genitourinary: Negative.  Negative for hematuria.  Musculoskeletal: Negative.  Negative for back pain.  Skin: Negative.  Negative for rash.  Neurological: Negative.  Negative for dizziness, focal weakness, weakness and headaches.  Psychiatric/Behavioral: The patient is nervous/anxious.     As per HPI. Otherwise, a complete review of systems is negative.  PAST MEDICAL HISTORY: Past Medical History:  Diagnosis Date  . A-fib ([place])   . Anemia   . Arthritis    ankles  . Cancer ([place])    multiple myeloma  . 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[place]) 10/26/2019  . Cardiomyopathy ([place])   . CHF (congestive heart failure) ([place])   . Depression   . Dysrhythmia    atrial fib  . HLD (hyperlipidemia)   . Hypercholesteremia   . Hypertension   . Moderate tricuspid insufficiency   . Multiple myeloma ([place])    not being treated right now per pt 11/11/19  . Pneumonia   . Sleep apnea    No CPAP    PAST SURGICAL HISTORY: Past Surgical History:  Procedure Laterality Date  . ATRIAL ABLATION SURGERY    . CARDIAC SURGERY     A-Fib Ablations  . COLONOSCOPY WITH PROPOFOL N/A 10/26/2019   Procedure: COLONOSCOPY WITH BIOPSY;  Surgeon: Lucilla Lame, MD;  Location: [facility];  Service: Endoscopy;  Laterality: N/A;  . ESOPHAGOGASTRODUODENOSCOPY (EGD) WITH PROPOFOL N/A 10/26/2019   Procedure: ESOPHAGOGASTRODUODENOSCOPY (EGD) WITH BIOPSY;  Surgeon: Lucilla Lame, MD;  Location: [facility];  Service: Endoscopy;  Laterality: N/A;  sleep apnea  . LAPAROSCOPIC PARTIAL COLECTOMY Right 11/17/2019   Procedure: LAPAROSCOPIC PARTIAL COLECTOMY RIGHT EXTENDED;  Surgeon: Hampton Abbot,  Jacqulyn Bath, MD;  Location: [facility];  Service: General;  Laterality: Right;    FAMILY HISTORY: Family History  Problem Relation Age of Onset  . Healthy Mother     ADVANCED DIRECTIVES (Y/N):  N  HEALTH MAINTENANCE: Social History   Tobacco Use  . Smoking status: Never Smoker  .  Smokeless tobacco: Never Used  Substance Use Topics  . Alcohol use: Not Currently  . Drug use: Never     Colonoscopy:  PAP:  Bone density:  Lipid panel:  No Known Allergies  Current Outpatient Medications  Medication Sig Dispense Refill  . APPLE CIDER VINEGAR PO Take 30-45 mLs by mouth every other day.     Marland Kitchen aspirin 325 MG EC tablet Take 325 mg by mouth every other day. In the morning    . digoxin (DIGOX) 0.25 MG tablet Take 0.25 mg by mouth daily.     . furosemide (LASIX) 80 MG tablet Take 80 mg by mouth 2 (two) times daily. Morning & late afternoon    . ibuprofen (ADVIL) 100 MG/5ML suspension Take 30 mLs (600 mg total) by mouth every 6 (six) hours as needed for mild pain or moderate pain. 473 mL 1  . lidocaine-prilocaine (EMLA) cream Apply to affected area once 30 g 3  . Misc Natural Products (TART CHERRY ADVANCED PO) Take 60 mLs by mouth every other day.    . ondansetron (ZOFRAN) 8 MG tablet Take 1 tablet (8 mg total) by mouth 2 (two) times daily as needed for refractory nausea / vomiting. 60 tablet 2  . potassium bicarbonate (KLOR-CON/EF) 25 MEQ disintegrating tablet Take 25 mEq by mouth 4 (four) times a week. In the afternoon.    . prochlorperazine (COMPAZINE) 10 MG tablet Take 1 tablet (10 mg total) by mouth every 6 (six) hours as needed (Nausea or vomiting). 60 tablet 2   No current facility-administered medications for this visit.    OBJECTIVE: There were no vitals filed for this visit.   There is no height or weight on file to calculate BMI.    ECOG FS:0 - Asymptomatic  General: Well-developed, well-nourished, no acute distress. HEENT: Normocephalic. Neuro: Alert, answering all questions appropriately. Cranial nerves grossly intact. Psych: Normal affect.  LAB RESULTS:  Lab Results  Component Value Date   NA 138 11/20/2019   K 3.6 11/20/2019   CL 114 (H) 11/20/2019   CO2 20 (L) 11/20/2019   GLUCOSE 97 11/20/2019   BUN 19 11/20/2019   CREATININE 1.67 (H)  11/20/2019   CALCIUM 8.4 (L) 11/20/2019   PROT 7.8 11/13/2019   ALBUMIN 3.5 11/13/2019   AST 42 (H) 11/13/2019   ALT 30 11/13/2019   ALKPHOS 133 (H) 11/13/2019   BILITOT 0.9 11/13/2019   GFRNONAA 43 (L) 11/20/2019   GFRAA 50 (L) 11/20/2019    Lab Results  Component Value Date   WBC 6.8 11/18/2019   NEUTROABS 5.8 11/18/2019   HGB 10.5 (L) 11/18/2019   HCT 34.2 (L) 11/18/2019   MCV 88.1 11/18/2019   PLT 142 (L) 11/18/2019   Lab Results  Component Value Date   IRON 35 (L) 09/09/2019   TIBC 357 09/09/2019   IRONPCTSAT 10 (L) 09/09/2019   Lab Results  Component Value Date   FERRITIN 11 (L) 09/09/2019     STUDIES: No results found.  ASSESSMENT: Stage IIIc colon cancer, smoldering myeloma.  PLAN:    1.  Stage IIIc colon cancer: Imaging and final pathology reviewed independently confirming stage of disease.  Patient  has agreed to adjuvant FOLFOX chemotherapy every 2 weeks for a total of 12 cycles.  Patient states his port placement will be scheduled next week.  He wishes to push his chemotherapy back 1 week, therefore he will return to clinic on December 30, 2019 to initiate cycle 1 of 12 of adjuvant FOLFOX.   2. Smoldering myeloma: Patient was initially diagnosed and treated in [place], [place] and treated with single agent Velcade in approximately 2013.  He was evaluated in clinic in July 2014 when he declined any maintenance treatment or referral for bone marrow transplant.  He was subsequently lost to follow-up.  His most recent SPEP revealed an M spike of 2.0 with an IgG predominance of 2949.  These are unchanged from 3 months prior.  He also has a suppressed IgA level.  Kappa/lambda light chain ratio has trended up slightly to 2.17.  He has mild renal insufficiency with a creatinine of 1.67, but no other evidence of endorgan damage.  Metastatic bone survey on May 20, 2019 reviewed independently with no obvious bony lesions.  Bone marrow biopsy completed on May 28, 2019  revealed only 10 to 15% plasma cells with no clonality reported.  Cytogenetics were also reported as normal.  No intervention is needed at this time.  Continue to monitor closely. 3.  Iron deficiency anemia: Likely secondary to recently diagnosed colon cancer.  Patient's hemoglobin has improved, but his iron stores remain reduced.  He last received IV Feraheme on October 06, 2019.   4.  Renal insufficiency: Patient's most recent creatinine on November 20, 2019 was 1.67.  Monitor. 5.  Peripheral neuropathy: Chronic and unchanged.  Patient does not complain of this today.  I provided 20 minutes of face-to-face video visit time during this encounter which included chart review, counseling, and coordination of care as documented above.    Patient expressed understanding and was in agreement with this plan. He also understands that He can call clinic at any time with any questions, concerns, or complaints.    Lloyd Huger, MD   12/18/2019 3:48 PM

## 2019-12-18 NOTE — Progress Notes (Signed)
Error

## 2019-12-21 ENCOUNTER — Inpatient Hospital Stay (HOSPITAL_BASED_OUTPATIENT_CLINIC_OR_DEPARTMENT_OTHER): Payer: BC Managed Care – PPO | Admitting: Oncology

## 2019-12-21 ENCOUNTER — Telehealth: Payer: Self-pay | Admitting: Surgery

## 2019-12-21 DIAGNOSIS — C9000 Multiple myeloma not having achieved remission: Secondary | ICD-10-CM | POA: Diagnosis not present

## 2019-12-21 DIAGNOSIS — C184 Malignant neoplasm of transverse colon: Secondary | ICD-10-CM | POA: Diagnosis not present

## 2019-12-21 NOTE — Telephone Encounter (Signed)
Pt has been advised of pre admission date/time, Covid Testing date and Surgery date.  Surgery Date: 12/24/19 Preadmission Testing Date: 12/22/19 (8a-1p) Covid Testing Date: 12/23/19 - patient advised to go to the [facility] ([place])  Patient has been made aware to call [phone], between 1-3:00pm the day before surgery, to find out what time to arrive.

## 2019-12-21 NOTE — Progress Notes (Signed)
[place]  [phone] [phone]  Patient Care Team: Sofie Hartigan, MD as PCP - General (Family Medicine)   Name of the patient: Chris George  [id number]  15-Mar-1956   Date of visit: 12/21/19  Diagnosis- Colon Cancer/smoldering myeloma  Chief complaint/Reason for visit- Initial Meeting for [facility], preparing for starting chemotherapy  I connected with [name] on 12/21/19 at  1:30 PM EST by telephone visit and verified that I am speaking with the correct person using two identifiers.   I discussed the limitations, risks, security and privacy concerns of performing an evaluation and management service by telemedicine and the availability of in-person appointments. I also discussed with the patient that there may be a patient responsible charge related to this service. The patient expressed understanding and agreed to proceed.   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[place])   12/01/2019 Cancer Staging   Staging form: Colon and Rectum, AJCC 8th Edition - Clinical stage from 12/01/2019: Stage IIIC (cT3, cN2b, cM0) - Signed by Lloyd Huger, MD on 12/01/2019   12/23/2019 -  Chemotherapy   The patient had palonosetron (ALOXI) injection 0.25 mg, 0.25 mg, Intravenous,  Once, 0 of 12 cycles leucovorin 860 mg in dextrose 5 % 250 mL infusion, 400 mg/m2 = 860 mg, Intravenous,  Once, 0 of 12 cycles oxaliplatin (ELOXATIN) 185 mg in dextrose 5 % 500 mL chemo infusion, 85 mg/m2 = 185 mg, Intravenous,  Once, 0 of 12 cycles fluorouracil (ADRUCIL) chemo injection 850 mg, 400 mg/m2 = 850 mg, Intravenous,  Once, 0 of 12 cycles fluorouracil (ADRUCIL) 5,150 mg in  sodium chloride 0.9 % 147 mL chemo infusion, 2,400 mg/m2 = 5,150 mg, Intravenous, 1 Day/Dose, 0 of 12 cycles  for chemotherapy treatment.      Interval history-  Mr. Heindl is a 64 year old male who presents to chemo care clinic today for initial meeting in preparation for starting chemotherapy. I introduced the chemo care clinic and we discussed that the role of the clinic is to assist those who are at an increased risk of emergency room visits and/or complications during the course of chemotherapy treatment. We discussed that the increased risk takes into account factors such as age, performance status, and co-morbidities. We also discussed that for some, this might include barriers to care such as not having a primary care provider, lack of insurance/transportation, or not being able to afford medications. We discussed that the goal of the program is to help prevent unplanned ER visits and help reduce complications during chemotherapy. We do this by discussing specific risk factors to each individual and identifying ways that we can help improve these risk factors and reduce barriers to care.   ECOG FS:0 - Asymptomatic  Review of systems- Review of Systems  Constitutional: Negative.  Negative for chills, fever, malaise/fatigue and weight loss.  HENT: Negative for congestion, ear pain and tinnitus.   Eyes: Negative.  Negative for blurred vision and double vision.  Respiratory: Negative.  Negative for cough, sputum production and shortness of breath.   Cardiovascular: Negative.  Negative for chest pain, palpitations and leg swelling.  Gastrointestinal: Negative.  Negative for abdominal pain, constipation, diarrhea, nausea and vomiting.  Genitourinary: Negative for  dysuria, frequency and urgency.  Musculoskeletal: Negative for back pain and falls.  Skin: Negative.  Negative for rash.  Neurological: Negative.  Negative for weakness and headaches.  Endo/Heme/Allergies: Negative.  Does not  bruise/bleed easily.  Psychiatric/Behavioral: Negative for depression. The patient is nervous/anxious. The patient does not have insomnia.     Current treatment- FOLFOX q 2 weeks X 12 cycles   No Known Allergies  Past Medical History:  Diagnosis Date  . A-fib ([place])   . Anemia   . Arthritis    ankles  . Cancer ([place])    multiple myeloma  . 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 Cardiomyopathy ([place])   . CHF (congestive heart failure) ([place])   . Depression   . Dysrhythmia    atrial fib  . HLD (hyperlipidemia)   . Hypercholesteremia   . Hypertension   . Moderate tricuspid insufficiency   . Multiple myeloma ([place])    not being treated right now per pt 11/11/19  . Pneumonia   . Sleep apnea    No CPAP    Past Surgical History:  Procedure Laterality Date  . ATRIAL ABLATION SURGERY    . CARDIAC SURGERY     A-Fib Ablations  . COLONOSCOPY WITH PROPOFOL N/A 10/26/2019   Procedure: COLONOSCOPY WITH BIOPSY;  Surgeon: Lucilla Lame, MD;  Location: [place];  Service: Endoscopy;  Laterality: N/A;  . ESOPHAGOGASTRODUODENOSCOPY (EGD) WITH PROPOFOL N/A 10/26/2019   Procedure: ESOPHAGOGASTRODUODENOSCOPY (EGD) WITH BIOPSY;  Surgeon: Lucilla Lame, MD;  Location: [place];  Service: Endoscopy;  Laterality: N/A;  sleep apnea  . LAPAROSCOPIC PARTIAL COLECTOMY Right 11/17/2019   Procedure: LAPAROSCOPIC PARTIAL COLECTOMY RIGHT EXTENDED;  Surgeon: Olean Ree, MD;  Location: [facility];  Service: General;  Laterality: Right;    Social History   Socioeconomic History  . Marital status: Divorced    Spouse name: Not on file  . Number of children: Not on file  . Years of education: Not on file  . Highest education level: Not on file  Occupational History  . Not on file  Tobacco Use  . Smoking status: Never Smoker  . Smokeless tobacco: Never Used  Substance and Sexual Activity  . Alcohol use: Not Currently  . Drug use: Never  . Sexual activity: Not on file  Other  Topics Concern  . Not on file  Social History Narrative  . Not on file   Social Determinants of Health   Financial Resource Strain: Low Risk   . Difficulty of Paying Living Expenses: Not hard at all  Food Insecurity: No Food Insecurity  . Worried About Charity fundraiser in the Last Year: Never true  . Ran Out of Food in the Last Year: Never true  Transportation Needs: No Transportation Needs  . Lack of Transportation (Medical): No  . Lack of Transportation (Non-Medical): No  Physical Activity: Inactive  . Days of Exercise per Week: 0 days  . Minutes of Exercise per Session: 0 min  Stress: Stress Concern Present  . Feeling of Stress : To some extent  Social Connections: Slightly Isolated  . Frequency of Communication with Friends and Family: More than three times a week  . Frequency of Social Gatherings with Friends and Family: More than three times a week  . Attends Religious Services: More than 4 times per year  . Active Member of Clubs or Organizations: Yes  . Attends Archivist Meetings: More than 4 times per year  . Marital Status:  Divorced  Intimate Partner Violence: Not At Risk  . Fear of Current or Ex-Partner: No  . Emotionally Abused: No  . Physically Abused: No  . Sexually Abused: No    Family History  Problem Relation Age of Onset  . Healthy Mother      Current Outpatient Medications:  .  APPLE CIDER VINEGAR PO, Take 30-45 mLs by mouth every other day. , Disp: , Rfl:  .  aspirin 325 MG EC tablet, Take 325 mg by mouth every other day. In the morning, Disp: , Rfl:  .  digoxin (DIGOX) 0.25 MG tablet, Take 0.25 mg by mouth daily. , Disp: , Rfl:  .  furosemide (LASIX) 80 MG tablet, Take 80 mg by mouth 2 (two) times daily. Morning & late afternoon, Disp: , Rfl:  .  ibuprofen (ADVIL) 100 MG/5ML suspension, Take 30 mLs (600 mg total) by mouth every 6 (six) hours as needed for mild pain or moderate pain., Disp: 473 mL, Rfl: 1 .  lidocaine-prilocaine (EMLA)  cream, Apply to affected area once, Disp: 30 g, Rfl: 3 .  Misc Natural Products (TART CHERRY ADVANCED PO), Take 60 mLs by mouth every other day., Disp: , Rfl:  .  ondansetron (ZOFRAN) 8 MG tablet, Take 1 tablet (8 mg total) by mouth 2 (two) times daily as needed for refractory nausea / vomiting., Disp: 60 tablet, Rfl: 2 .  potassium bicarbonate (KLOR-CON/EF) 25 MEQ disintegrating tablet, Take 25 mEq by mouth 4 (four) times a week. In the afternoon., Disp: , Rfl:  .  prochlorperazine (COMPAZINE) 10 MG tablet, Take 1 tablet (10 mg total) by mouth every 6 (six) hours as needed (Nausea or vomiting)., Disp: 60 tablet, Rfl: 2  Physical exam: There were no vitals filed for this visit. Limited D/t telephone visit  CMP Latest Ref Rng & Units 11/20/2019  Glucose 70 - 99 mg/dL 97  BUN 8 - 23 mg/dL 19  Creatinine 0.61 - 1.24 mg/dL 1.67(H)  Sodium 135 - 145 mmol/L 138  Potassium 3.5 - 5.1 mmol/L 3.6  Chloride 98 - 111 mmol/L 114(H)  CO2 22 - 32 mmol/L 20(L)  Calcium 8.9 - 10.3 mg/dL 8.4(L)  Total Protein 6.5 - 8.1 g/dL -  Total Bilirubin 0.3 - 1.2 mg/dL -  Alkaline Phos 38 - 126 U/L -  AST 15 - 41 U/L -  ALT 0 - 44 U/L -   CBC Latest Ref Rng & Units 11/18/2019  WBC 4.0 - 10.5 K/uL 6.8  Hemoglobin 13.0 - 17.0 g/dL 10.5(L)  Hematocrit 39.0 - 52.0 % 34.2(L)  Platelets 150 - 400 K/uL 142(L)    No images are attached to the encounter.  No results found.   Assessment and plan- Patient is a 64 y.o. male who presents to [facility] for initial meeting in preparation for starting chemotherapy for the treatment of stage IIIc colon cancer.    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  He was initially diagnosed and treated in [place], [place] with single agent Velcade and 2013.  He developed severe anemia and was sent for colonoscopy.  He was found to have a large partially obstructing tumor in the descending colon.  It was biopsied.  Pathology  revealed invasive moderately differentiated adenocarcinoma.  He had laparoscopic right colectomy open umbilical hernia repair partial omentectomy for colon cancer by Dr. Hampton Abbot.  He is scheduled to begin FOLFOX chemotherapy every 2 weeks for a total of 12 cycles.  Scheduled to begin first treatment on 12/30/2019.  He will get his port placed by Dr. Hampton Abbot on 01/03/20.   2. Chemo Care Clinic/High Risk for ER/Hospitalization during chemotherapy- We discussed the role of the chemo care clinic and identified patient specific risk factors. I discussed that patient was identified as high risk primarily based on: stage of disease and comorbidities.   Patient has past medical history positive for: Past Medical History:  Diagnosis Date  . A-fib ([place])   . Anemia   . Arthritis    ankles  . Cancer ([place])    multiple myeloma  . 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 Cardiomyopathy ([place])   . CHF (congestive heart failure) ([place])   . Depression   . Dysrhythmia    atrial fib  . HLD (hyperlipidemia)   . Hypercholesteremia   . Hypertension   . Moderate tricuspid insufficiency   . Multiple myeloma ([place])    not being treated right now per pt 11/11/19  . Pneumonia   . Sleep apnea    No CPAP    Patient has past surgical history positive for: Past Surgical History:  Procedure Laterality Date  . ATRIAL ABLATION SURGERY    . CARDIAC SURGERY     A-Fib Ablations  . COLONOSCOPY WITH PROPOFOL N/A 10/26/2019   Procedure: COLONOSCOPY WITH BIOPSY;  Surgeon: Lucilla Lame, MD;  Location: [facility];  Service: Endoscopy;  Laterality: N/A;  . ESOPHAGOGASTRODUODENOSCOPY (EGD) WITH PROPOFOL N/A 10/26/2019   Procedure: ESOPHAGOGASTRODUODENOSCOPY (EGD) WITH BIOPSY;  Surgeon: Lucilla Lame, MD;  Location: [facility];  Service: Endoscopy;  Laterality: N/A;  sleep apnea  . LAPAROSCOPIC PARTIAL COLECTOMY Right 11/17/2019   Procedure: LAPAROSCOPIC PARTIAL COLECTOMY RIGHT EXTENDED;  Surgeon: Olean Ree, MD;  Location: [facility];  Service: General;  Laterality: Right;    Based on our high risk symptom management report; this patient has a high risk of ED utilization.  The percentage below indicates how "at risk "  this patient based on the factors in this table within one year.   General Risk Score: 6  Values used to calculate this score:   Points  Metrics      0        Age: 89      [facility] Admissions: 5      1        ED Visits: 1      0        Has Chronic Obstructive Pulmonary Disease: No      1        Has Diabetes: Yes      0        Has Congestive Heart Failure: No      0        Has liver disease: No      1        Has Depression: Yes      0        Current PCP: Sofie Hartigan, MD      0        Has Medicaid: No  91% High Risk Risk of Admission or ED Visit  This score indicates an adult patient's 1-year risk, as a percentage, of a hospital admission or ED visit.    0%  91%   40%   20%   5 months ago 3 months ago 8 weeks ago Today    86% 06/23/19 2000  88% 10/26/19 0918   91% 11/17/19 1339   91% 12/21/19 1356    Factor Value  Current PCP Sofie Hartigan, MD  Hospital Admissions 5   ED Visits 1   Has Medicaid No  Has Medicare No  In relationship No  Has Anemia Yes  Has asthma No  Has atrial fibrillation Yes  Has CVD Yes  Has chronic kidney disease Yes   Has Chronic Obstructive Pulmonary Disease No  Has Congestive Heart Failure No  Has Connective Tissue Disorder No  Has Depression Yes   Has Diabetes Yes  Has liver disease No  Has Peripheral Vascular Disease     3. We discussed that social determinants of health may have significant impacts on health and outcomes for cancer patients.  Today we discussed specific social determinants of performance status, alcohol use, depression, financial needs, food insecurity, housing, interpersonal violence, social connections, stress, tobacco use, and transportation.    After lengthy discussion the following were  identified as areas of need: Denies any needs at this time.   We discussed options including home based and outpatient services, DME, and CARE program. We discusssed that patients who participate in regular physical activity report fewer negative impacts of cancer and treatments and report less fatigue.  We discussed self-referral to [facility] for counseling services, psychiatry for medication management, or palliative care/symptom management as well as primary care providers.  We discussed that living with cancer can create tremendous financial burden.  We discussed options for assistance. I asked that if assistance is needed in affording medications or paying bills to please let us know so that we can provide assistance.  We discussed options for food including social services.  We will also notify [facility] to see if cancer center can provide support.  Patient informed of food pantry at cancer center and was provided with care package today.  Please notify nursing if un-met needs. We discussed referral to social work. Will also discuss with Elease Etienne to see if [place] can provide support of utility bills, etc.  We discussed options for support groups at the cancer center. If interested, please notify nurse navigator to enroll.  We discussed options for managing stress including healthy eating, exercise as well as participating in no charge counseling services at the cancer center and support groups.  If these are of interest, patient can notify either myself or primary nursing team. We discussed options for management including medications and referral to quit Smart program We discussed options for transportation including acta, paratransit, bus routes, link transit, taxi/uber/lyft, and cancer center van.  I have notified primary oncology team who will help assist with arranging Lucianne Lei transportation for appointments when needed. We also discussed options for transportation on short  notice/acute visits.   Plan: Discuss [facility].  Discuss resources available.  Port to be placed on Thursday with Dr. Mont Dutton office  Disposition: Port placed on 12/14/19 RTC as scheduled on 12/30/19 for labs, md assessment and consideration of Cycle 1 FOLFOX.   We also discussed the role of the Symptom Management Clinic at [facility] for acute issues and methods of contacting clinic/provider. He denies needing specific assistance at this time and He will be followed by Mila Merry, RN (Nurse Navigator).   Visit Diagnosis 1. 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 Multiple myeloma, remission status unspecified ([place])     Patient expressed understanding and was in agreement with this plan. He also understands that He can call clinic at any  time with any questions, concerns, or complaints.   I provided 25 minutes of non face-to-face telephone visit time during this encounter, and > 50% was spent counseling as documented under my assessment & plan.  [place] [facility] [place]  CC: Dr. Grayland Ormond

## 2019-12-22 ENCOUNTER — Other Ambulatory Visit: Payer: Self-pay

## 2019-12-22 ENCOUNTER — Encounter
Admission: RE | Admit: 2019-12-22 | Discharge: 2019-12-22 | Disposition: A | Discharge status: Home / Self Care | Payer: BC Managed Care – PPO | Source: Ambulatory Visit | Attending: Surgery | Admitting: Surgery

## 2019-12-22 DIAGNOSIS — Z01818 Encounter for other preprocedural examination: Secondary | ICD-10-CM | POA: Insufficient documentation

## 2019-12-22 NOTE — Patient Instructions (Signed)
  Your procedure is scheduled on: Thursday 12/24/19 Report to Day Surgery. To find out your arrival time please call [phone] between 1PM - 3PM on Wednesday 12/23/19.  Remember: Instructions that are not followed completely may result in serious medical risk,  up to and including death, or upon the discretion of your surgeon and anesthesiologist your  surgery may need to be rescheduled.     _X__ 1. Do not eat food after midnight the night before your procedure.                 No gum chewing or hard candies. You may drink clear liquids up to 2 hours                 before you are scheduled to arrive for your surgery- DO not drink clear                 liquids within 2 hours of the start of your surgery.                 Clear Liquids include:  water, apple juice without pulp, clear Gatorade, G2 or                  Gatorade Zero (avoid Red/Purple/Blue), Black Coffee or Tea (Do not add                 anything to coffee or tea).   __X__2.  On the morning of surgery brush your teeth with toothpaste and water, you                may rinse your mouth with mouthwash if you wish.  Do not swallow any toothpaste of mouthwash.       ____  3.  Notify your doctor if there is any change in your medical condition      (cold, fever, infections).        Do not wear jewelry, make-up, hairpins, clips or nail polish. Do not wear lotions, powders, or perfumes. You may wear deodorant. Do not shave 48 hours prior to surgery. Men may shave face and neck. Do not bring valuables to the hospital.    [facility] is not responsible for any belongings or valuables.    Patients discharged the day of surgery will not be allowed to drive home.   Make arrangements for someone to be with you for the first 24 hours of your Same Day Discharge.       ____ Take these medicines the morning of surgery with A SIP OF WATER:    1. digoxin ([place])      ____ Use CHG Soap as  directed    ____ Stop aspirin as instructed by provider   ____ Stop Anti-inflammatories like aspirin, ibuprofen, BC powders, and Aleve    ____ Stop supplements until after surgery. APPLE CIDER VINEGAR and TART CHERRY ADVANCED   ____ Do not start any new supplements prior to surgery

## 2019-12-23 ENCOUNTER — Telehealth: Payer: Self-pay | Admitting: Surgery

## 2019-12-23 ENCOUNTER — Other Ambulatory Visit
Admission: RE | Admit: 2019-12-23 | Discharge: 2019-12-23 | Disposition: A | Discharge status: Home / Self Care | Payer: BC Managed Care – PPO | Source: Ambulatory Visit | Attending: Surgery | Admitting: Surgery

## 2019-12-23 ENCOUNTER — Ambulatory Visit: Payer: BC Managed Care – PPO

## 2019-12-23 ENCOUNTER — Other Ambulatory Visit: Payer: BC Managed Care – PPO

## 2019-12-23 ENCOUNTER — Ambulatory Visit: Payer: BC Managed Care – PPO | Admitting: Oncology

## 2019-12-23 DIAGNOSIS — Z01812 Encounter for preprocedural laboratory examination: Secondary | ICD-10-CM | POA: Diagnosis not present

## 2019-12-23 DIAGNOSIS — Z20822 Contact with and (suspected) exposure to covid-19: Secondary | ICD-10-CM | POA: Insufficient documentation

## 2019-12-23 LAB — SARS CORONAVIRUS 2 (TAT 6-24 HRS): SARS Coronavirus 2: NEGATIVE

## 2019-12-23 NOTE — Telephone Encounter (Signed)
Pt has been advised of updated pre admission date/time, Covid Testing date and Surgery date.  Surgery Date: 12/28/19 Preadmission Testing Date: Previously completed 12/22/19 Covid Testing Date: Previously completed (12/23/19) - patient advised to go to the [facility] ([place])  Patient has been made aware to call [phone], between 1-3:00pm the day before surgery, to find out what time to arrive.

## 2019-12-23 NOTE — Telephone Encounter (Signed)
Patient is calling and would like to cancel his surgery for tomorrow with Dr. Hampton Abbot due to the weather and would like to r/s. Please call patient and advise.

## 2019-12-24 NOTE — Progress Notes (Signed)
Chris George  [phone] [phone]  ID: ALBEN JEPSEN OB: 1955-11-27  MR#: [id number]  IBB#:[id number]  Patient Care Team: Sofie Hartigan, MD as PCP - General (Family Medicine)  CHIEF COMPLAINT: Stage IIIc colon cancer, smoldering myeloma.  INTERVAL HISTORY: Patient return to clinic today for further evaluation and initiation of cycle 1 of 12 of adjuvant FOLFOX. He is anxious, but otherwise feels well. He has no neurologic complaints. He does not complain of weakness or fatigue today.  He has a good appetite and denies weight loss.  He has no chest pain, shortness of breath, cough, or hemoptysis.  He denies any nausea, vomiting, constipation, or diarrhea.  He has no melena or hematochezia.  He has no urinary complaints. Patient offers no further specific complaints today.  REVIEW OF SYSTEMS:   Review of Systems  Constitutional: Negative.  Negative for fever, malaise/fatigue and weight loss.  Respiratory: Negative.  Negative for cough, hemoptysis and shortness of breath.   Cardiovascular: Negative.  Negative for chest pain and leg swelling.  Gastrointestinal: Negative.  Negative for abdominal pain, blood in stool and melena.  Genitourinary: Negative.  Negative for hematuria.  Musculoskeletal: Negative.  Negative for back pain.  Skin: Negative.  Negative for rash.  Neurological: Negative.  Negative for dizziness, focal weakness, weakness and headaches.  Psychiatric/Behavioral: The patient is nervous/anxious.     As per HPI. Otherwise, a complete review of systems is negative.  PAST MEDICAL HISTORY: Past Medical History:  Diagnosis Date  . A-fib ([place])   . Anemia   . Arthritis    ankles  . Cancer ([place])    multiple myeloma  . 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[place]) 10/26/2019  . Cardiomyopathy ([place])   . CHF (congestive heart failure) ([place])   . Depression   . Dysrhythmia    atrial fib  . HLD (hyperlipidemia)   . Hypercholesteremia   .  Hypertension   . Moderate tricuspid insufficiency   . Multiple myeloma ([place])    not being treated right now per pt 11/11/19  . Pneumonia   . Sleep apnea    No CPAP    PAST SURGICAL HISTORY: Past Surgical History:  Procedure Laterality Date  . ATRIAL ABLATION SURGERY    . CARDIAC SURGERY     A-Fib Ablations  . COLONOSCOPY WITH PROPOFOL N/A 10/26/2019   Procedure: COLONOSCOPY WITH BIOPSY;  Surgeon: Lucilla Lame, MD;  Location: [facility];  Service: Endoscopy;  Laterality: N/A;  . ESOPHAGOGASTRODUODENOSCOPY (EGD) WITH PROPOFOL N/A 10/26/2019   Procedure: ESOPHAGOGASTRODUODENOSCOPY (EGD) WITH BIOPSY;  Surgeon: Lucilla Lame, MD;  Location: [facility];  Service: Endoscopy;  Laterality: N/A;  sleep apnea  . LAPAROSCOPIC PARTIAL COLECTOMY Right 11/17/2019   Procedure: LAPAROSCOPIC PARTIAL COLECTOMY RIGHT EXTENDED;  Surgeon: Olean Ree, MD;  Location: [facility];  Service: General;  Laterality: Right;  . PORTACATH PLACEMENT N/A 12/28/2019   Procedure: INSERTION PORT-A-CATH Left subclavian;  Surgeon: Olean Ree, MD;  Location: [facility];  Service: General;  Laterality: N/A;    FAMILY HISTORY: Family History  Problem Relation Age of Onset  . Healthy Mother     ADVANCED DIRECTIVES (Y/N):  N  HEALTH MAINTENANCE: Social History   Tobacco Use  . Smoking status: Never Smoker  . Smokeless tobacco: Never Used  Substance Use Topics  . Alcohol use: Not Currently  . Drug use: Never     Colonoscopy:  PAP:  Bone density:  Lipid panel:  No Known Allergies  Current Outpatient Medications  Medication Sig Dispense Refill  . APPLE CIDER VINEGAR PO Take 30-45 mLs by mouth every other day.     Marland Kitchen aspirin 325 MG EC tablet Take 325 mg by mouth every other day. In the morning    . digoxin (DIGOX) 0.25 MG tablet Take 0.25 mg by mouth daily.     . furosemide (LASIX) 80 MG tablet Take 80 mg by mouth 2 (two) times daily. Morning & late afternoon    . lidocaine-prilocaine (EMLA)  cream Apply to affected area once 30 g 3  . Misc Natural Products (TART CHERRY ADVANCED PO) Take 60 mLs by mouth every other day.    . potassium bicarbonate (KLOR-CON/EF) 25 MEQ disintegrating tablet Take 25 mEq by mouth 4 (four) times a week. In the afternoon.    . ondansetron (ZOFRAN) 8 MG tablet Take 1 tablet (8 mg total) by mouth 2 (two) times daily as needed for refractory nausea / vomiting. (Patient not taking: Reported on 12/29/2019) 60 tablet 2  . oxyCODONE (OXY IR/ROXICODONE) 5 MG immediate release tablet Take 1 tablet (5 mg total) by mouth every 4 (four) hours as needed for moderate pain or severe pain. (Patient not taking: Reported on 12/29/2019) 30 tablet 0  . prochlorperazine (COMPAZINE) 10 MG tablet Take 1 tablet (10 mg total) by mouth every 6 (six) hours as needed (Nausea or vomiting). (Patient not taking: Reported on 12/29/2019) 60 tablet 2   No current facility-administered medications for this visit.    OBJECTIVE: Vitals:   12/30/19 0904  BP: 128/86  Pulse: 69  Resp: 17  SpO2: 99%     Body mass index is 31.28 kg/m.    ECOG FS:0 - Asymptomatic  General: Well-developed, well-nourished, no acute distress. Eyes: Pink conjunctiva, anicteric sclera. HEENT: Normocephalic, moist mucous membranes. Lungs: No audible wheezing or coughing. Heart: Regular rate and rhythm. Abdomen: Soft, nontender, no obvious distention. Musculoskeletal: No edema, cyanosis, or clubbing. Neuro: Alert, answering all questions appropriately. Cranial nerves grossly intact. Skin: No rashes or petechiae noted. Psych: Normal affect.   LAB RESULTS:  Lab Results  Component Value Date   NA 137 12/30/2019   K 3.2 (L) 12/30/2019   CL 102 12/30/2019   CO2 28 12/30/2019   GLUCOSE 111 (H) 12/30/2019   BUN 14 12/30/2019   CREATININE 1.35 (H) 12/30/2019   CALCIUM 8.2 (L) 12/30/2019   PROT 7.3 12/30/2019   ALBUMIN 3.3 (L) 12/30/2019   AST 21 12/30/2019   ALT 12 12/30/2019   ALKPHOS 111 12/30/2019    BILITOT 0.7 12/30/2019   GFRNONAA 55 (L) 12/30/2019   GFRAA >60 12/30/2019    Lab Results  Component Value Date   WBC 3.6 (L) 12/30/2019   NEUTROABS 2.5 12/30/2019   HGB 10.7 (L) 12/30/2019   HCT 34.6 (L) 12/30/2019   MCV 89.6 12/30/2019   PLT 144 (L) 12/30/2019   Lab Results  Component Value Date   IRON 35 (L) 09/09/2019   TIBC 357 09/09/2019   IRONPCTSAT 10 (L) 09/09/2019   Lab Results  Component Value Date   FERRITIN 11 (L) 09/09/2019     STUDIES: DG Chest Port 1 View  Result Date: 12/28/2019 CLINICAL DATA:  Port-A-Cath placement. Multiple myeloma. Transverse colon cancer. EXAM: PORTABLE CHEST 1 VIEW COMPARISON:  CT chest 11/11/2019. FINDINGS: Trachea is midline. Heart is enlarged. Left-sided Port-A-Cath is in the SVC. No pneumothorax. Mild mixed interstitial and airspace opacification. No definite pleural fluid. IMPRESSION: 1. No pneumothorax after left-sided Port-A-Cath placement. 2. Suspect mild pulmonary  edema. Electronically Signed   By: Lorin Picket M.D.   On: 12/28/2019 14:30   DG C-Arm 1-60 Min-No Report  Result Date: 12/28/2019 Fluoroscopy was utilized by the requesting physician.  No radiographic interpretation.    ASSESSMENT: Stage IIIc colon cancer, smoldering myeloma.  PLAN:    1.  Stage IIIc colon cancer: Imaging and final pathology reviewed independently confirming stage of disease.  Patient has agreed to adjuvant FOLFOX chemotherapy every 2 weeks for a total of 12 cycles. Patient has now had port placement. Proceed with cycle 1 of 12 of adjuvant FOLFOX today. Return to clinic in 2 days for pump removal, in 1 week with laboratory work and video assisted telemedicine visit, and then in 2 weeks for further evaluation and consideration of cycle 2.  2. Smoldering myeloma: Patient was initially diagnosed and treated in [place], [place] and treated with single agent Velcade in approximately 2013.  He was evaluated in clinic in July 2014 when he declined any  maintenance treatment or referral for bone marrow transplant.  He was subsequently lost to follow-up.  His most recent SPEP revealed an M spike of 2.0 with an IgG predominance of 2949.  These are unchanged from 3 months prior.  He also has a suppressed IgA level.  Kappa/lambda light chain ratio has trended up slightly to 2.17.  He has mild renal insufficiency, but no other evidence of endorgan damage.  Metastatic bone survey on May 20, 2019 reviewed independently with no obvious bony lesions.  Bone marrow biopsy completed on May 28, 2019 revealed only 10 to 15% plasma cells with no clonality reported.  Cytogenetics were also reported as normal.  No intervention is needed at this time.  Continue to monitor closely. 3.  Iron deficiency anemia: Likely secondary to recently diagnosed colon cancer. Chronic and unchanged. Patient's hemoglobin is 10.7 today. He last received IV Feraheme on October 06, 2019.   4.  Renal insufficiency: Creatinine is slightly improved to 1.35, monitor. 5.  Peripheral neuropathy: Chronic and unchanged.  Patient does not complain of this today. 6. Leukopenia: Mild, monitor. 7. Thrombocytopenia: Mild, monitor.   Patient expressed understanding and was in agreement with this plan. He also understands that He can call clinic at any time with any questions, concerns, or complaints.    Lloyd Huger, MD   01/01/2020 6:07 AM

## 2019-12-25 ENCOUNTER — Inpatient Hospital Stay: Payer: BC Managed Care – PPO

## 2019-12-28 ENCOUNTER — Ambulatory Visit: Payer: BC Managed Care – PPO | Admitting: Registered Nurse

## 2019-12-28 ENCOUNTER — Encounter
Admission: RE | Disposition: A | Discharge status: Home / Self Care | Payer: Self-pay | Source: Home / Self Care | Attending: Surgery

## 2019-12-28 ENCOUNTER — Ambulatory Visit: Payer: BC Managed Care – PPO

## 2019-12-28 ENCOUNTER — Ambulatory Visit
Admission: RE | Admit: 2019-12-28 | Discharge: 2019-12-28 | Disposition: A | Discharge status: Home / Self Care | Payer: BC Managed Care – PPO | Attending: Surgery | Admitting: Surgery

## 2019-12-28 ENCOUNTER — Other Ambulatory Visit: Payer: Self-pay

## 2019-12-28 ENCOUNTER — Encounter: Payer: Self-pay | Admitting: Surgery

## 2019-12-28 DIAGNOSIS — Z95828 Presence of other vascular implants and grafts: Secondary | ICD-10-CM | POA: Diagnosis not present

## 2019-12-28 DIAGNOSIS — C189 Malignant neoplasm of colon, unspecified: Secondary | ICD-10-CM | POA: Insufficient documentation

## 2019-12-28 HISTORY — PX: PORTACATH PLACEMENT: SHX2246

## 2019-12-28 SURGERY — INSERTION, TUNNELED CENTRAL VENOUS DEVICE, WITH PORT
Anesthesia: General

## 2019-12-28 MED ORDER — EPHEDRINE SULFATE 50 MG/ML IJ SOLN
INTRAMUSCULAR | Status: DC | PRN
  Administered 2019-12-28 (×2): 5 mg via INTRAVENOUS

## 2019-12-28 MED ORDER — MIDAZOLAM HCL 2 MG/2ML IJ SOLN
INTRAMUSCULAR | Status: DC | PRN
  Administered 2019-12-28: 2 mg via INTRAVENOUS

## 2019-12-28 MED ORDER — LACTATED RINGERS IV SOLN: INTRAVENOUS | Status: DC

## 2019-12-28 MED ORDER — GABAPENTIN 300 MG PO CAPS
ORAL_CAPSULE | ORAL | Status: AC
  Filled 2019-12-28: qty 1

## 2019-12-28 MED ORDER — SODIUM CHLORIDE 0.9 % IV SOLN: INTRAVENOUS | Status: DC | PRN

## 2019-12-28 MED ORDER — CEFAZOLIN SODIUM-DEXTROSE 2-4 GM/100ML-% IV SOLN
INTRAVENOUS | Status: AC
  Filled 2019-12-28: qty 100

## 2019-12-28 MED ORDER — GABAPENTIN 300 MG PO CAPS: 300.0000 mg | ORAL_CAPSULE | ORAL | Status: DC

## 2019-12-28 MED ORDER — ONDANSETRON HCL 4 MG/2ML IJ SOLN: 4.0000 mg | Freq: Once | INTRAMUSCULAR | Status: DC | PRN

## 2019-12-28 MED ORDER — BUPIVACAINE-EPINEPHRINE 0.5% -1:200000 IJ SOLN
INTRAMUSCULAR | Status: DC | PRN
  Administered 2019-12-28: 25 mL

## 2019-12-28 MED ORDER — FENTANYL CITRATE (PF) 100 MCG/2ML IJ SOLN
INTRAMUSCULAR | Status: DC | PRN
  Administered 2019-12-28: 25 ug via INTRAVENOUS

## 2019-12-28 MED ORDER — OXYCODONE HCL 5 MG PO TABS: 5.0000 mg | ORAL_TABLET | ORAL | 0 refills | Status: DC | PRN

## 2019-12-28 MED ORDER — ONDANSETRON HCL 4 MG/2ML IJ SOLN
INTRAMUSCULAR | Status: DC | PRN
  Administered 2019-12-28: 4 mg via INTRAVENOUS

## 2019-12-28 MED ORDER — ACETAMINOPHEN 500 MG PO TABS
ORAL_TABLET | ORAL | Status: AC
  Filled 2019-12-28: qty 2

## 2019-12-28 MED ORDER — LIDOCAINE HCL (CARDIAC) PF 100 MG/5ML IV SOSY
PREFILLED_SYRINGE | INTRAVENOUS | Status: DC | PRN
  Administered 2019-12-28: 100 mg via INTRAVENOUS

## 2019-12-28 MED ORDER — FENTANYL CITRATE (PF) 100 MCG/2ML IJ SOLN: 25.0000 ug | INTRAMUSCULAR | Status: DC | PRN

## 2019-12-28 MED ORDER — CEFAZOLIN SODIUM-DEXTROSE 2-4 GM/100ML-% IV SOLN
2.0000 g | INTRAVENOUS | Status: AC
  Administered 2019-12-28: 13:00:00 2 g via INTRAVENOUS

## 2019-12-28 MED ORDER — EPINEPHRINE PF 1 MG/ML IJ SOLN
INTRAMUSCULAR | Status: AC
  Filled 2019-12-28: qty 1

## 2019-12-28 MED ORDER — FENTANYL CITRATE (PF) 100 MCG/2ML IJ SOLN
INTRAMUSCULAR | Status: AC
  Filled 2019-12-28: qty 2

## 2019-12-28 MED ORDER — PROPOFOL 10 MG/ML IV BOLUS
INTRAVENOUS | Status: DC | PRN
  Administered 2019-12-28: 180 mg via INTRAVENOUS

## 2019-12-28 MED ORDER — ONDANSETRON HCL 4 MG/2ML IJ SOLN
INTRAMUSCULAR | Status: AC
  Filled 2019-12-28: qty 2

## 2019-12-28 MED ORDER — CHLORHEXIDINE GLUCONATE CLOTH 2 % EX PADS: 6.0000 | MEDICATED_PAD | Freq: Once | CUTANEOUS | Status: DC

## 2019-12-28 MED ORDER — ACETAMINOPHEN 500 MG PO TABS: 1000.0000 mg | ORAL_TABLET | ORAL | Status: DC

## 2019-12-28 MED ORDER — FAMOTIDINE 20 MG PO TABS: 20.0000 mg | ORAL_TABLET | Freq: Once | ORAL | Status: DC

## 2019-12-28 MED ORDER — FAMOTIDINE 20 MG PO TABS
ORAL_TABLET | ORAL | Status: AC
  Filled 2019-12-28: qty 1

## 2019-12-28 MED ORDER — MIDAZOLAM HCL 2 MG/2ML IJ SOLN
INTRAMUSCULAR | Status: AC
  Filled 2019-12-28: qty 2

## 2019-12-28 SURGICAL SUPPLY — 33 items
BLADE SURG SZ11 CARB STEEL (BLADE) ×3 IMPLANT
CANISTER SUCT 1200ML W/VALVE (MISCELLANEOUS) ×3 IMPLANT
CHLORAPREP W/TINT 26 (MISCELLANEOUS) ×3 IMPLANT
COVER LIGHT HANDLE STERIS (MISCELLANEOUS) ×6 IMPLANT
COVER WAND RF STERILE (DRAPES) IMPLANT
DECANTER SPIKE VIAL GLASS SM (MISCELLANEOUS) IMPLANT
DERMABOND ADVANCED (GAUZE/BANDAGES/DRESSINGS) ×2
DERMABOND ADVANCED .7 DNX12 (GAUZE/BANDAGES/DRESSINGS) ×1 IMPLANT
DRAPE C-ARM XRAY 36X54 (DRAPES) ×3 IMPLANT
ELECT REM PT RETURN 9FT ADLT (ELECTROSURGICAL) ×3
ELECTRODE REM PT RTRN 9FT ADLT (ELECTROSURGICAL) ×1 IMPLANT
GLOVE SURG SYN 7.0 (GLOVE) ×3 IMPLANT
GLOVE SURG SYN 7.5  E (GLOVE) ×2
GLOVE SURG SYN 7.5 E (GLOVE) ×1 IMPLANT
GOWN STRL REUS W/ TWL LRG LVL3 (GOWN DISPOSABLE) ×3 IMPLANT
GOWN STRL REUS W/TWL LRG LVL3 (GOWN DISPOSABLE) ×6
KIT PORT POWER 8FR ISP CVUE (Port) ×3 IMPLANT
KIT TURNOVER KIT A (KITS) ×3 IMPLANT
LABEL OR SOLS (LABEL) ×3 IMPLANT
NEEDLE FILTER BLUNT 18X 1/2SAF (NEEDLE) ×2
NEEDLE FILTER BLUNT 18X1 1/2 (NEEDLE) ×1 IMPLANT
NEEDLE HYPO 22GX1.5 SAFETY (NEEDLE) ×3 IMPLANT
NS IRRIG 500ML POUR BTL (IV SOLUTION) ×3 IMPLANT
PACK PORT-A-CATH (MISCELLANEOUS) ×3 IMPLANT
SUT MNCRL AB 4-0 PS2 18 (SUTURE) ×3 IMPLANT
SUT PROLENE 2-0 (SUTURE) ×2
SUT PROLENE 2-0 RB1 36X2 ARM (SUTURE) ×1
SUT PROLENE 3 0 SH DA (SUTURE) ×3 IMPLANT
SUT VIC AB 3-0 SH 27 (SUTURE) ×2
SUT VIC AB 3-0 SH 27X BRD (SUTURE) ×1 IMPLANT
SUTURE PROLEN 2-0 RB1 36X2 ARM (SUTURE) ×1 IMPLANT
SYR 10ML LL (SYRINGE) ×6 IMPLANT
SYR 3ML LL SCALE MARK (SYRINGE) ×3 IMPLANT

## 2019-12-28 NOTE — Anesthesia Procedure Notes (Signed)
Procedure Name: LMA Insertion Date/Time: 12/28/2019 12:54 PM Performed by: Hedda Slade, CRNA Pre-anesthesia Checklist: Patient identified, Patient being monitored, Timeout performed, Emergency Drugs available and Suction available Patient Re-evaluated:Patient Re-evaluated prior to induction Oxygen Delivery Method: Circle system utilized Preoxygenation: Pre-oxygenation with 100% oxygen Induction Type: IV induction Ventilation: Mask ventilation without difficulty LMA: LMA inserted LMA Size: 4.5 Tube type: Oral Number of attempts: 1 Placement Confirmation: positive ETCO2 and breath sounds checked- equal and bilateral Tube secured with: Tape Dental Injury: Teeth and Oropharynx as per pre-operative assessment

## 2019-12-28 NOTE — Discharge Instructions (Signed)

## 2019-12-28 NOTE — Anesthesia Preprocedure Evaluation (Signed)
Anesthesia Evaluation  Patient identified by MRN, date of birth, ID band Patient awake    Reviewed: Allergy & Precautions, H&P , NPO status , Patient's Chart, lab work & pertinent test results, reviewed documented beta blocker date and time   Airway Mallampati: II  TM Distance: >3 FB Neck ROM: full    Dental no notable dental hx. (+) Teeth Intact   Pulmonary neg pulmonary ROS, sleep apnea , pneumonia,    Pulmonary exam normal breath sounds clear to auscultation       Cardiovascular Exercise Tolerance: Good hypertension, On Medications +CHF  negative cardio ROS  + dysrhythmias Atrial Fibrillation  Rhythm:regular Rate:Normal     Neuro/Psych PSYCHIATRIC DISORDERS Depression negative neurological ROS  negative psych ROS   GI/Hepatic negative GI ROS, Neg liver ROS, PUD,   Endo/Other  negative endocrine ROSdiabetes  Renal/GU      Musculoskeletal   Abdominal   Peds  Hematology negative hematology ROS (+) Blood dyscrasia, anemia ,   Anesthesia Other Findings   Reproductive/Obstetrics negative OB ROS                             Anesthesia Physical Anesthesia Plan  ASA: III  Anesthesia Plan: MAC   Post-op Pain Management:    Induction:   PONV Risk Score and Plan:   Airway Management Planned:   Additional Equipment:   Intra-op Plan:   Post-operative Plan:   Informed Consent: I have reviewed the patients History and Physical, chart, labs and discussed the procedure including the risks, benefits and alternatives for the proposed anesthesia with the patient or authorized representative who has indicated his/her understanding and acceptance.       Plan Discussed with: CRNA  Anesthesia Plan Comments:         Anesthesia Quick Evaluation

## 2019-12-28 NOTE — Op Note (Signed)
  Procedure Date:  12/28/2019  Pre-operative Diagnosis:  Colon cancer  Post-operative Diagnosis:  Colon cancer  Procedure:  Left subclavian port-a-cath placement  Surgeon:  Melvyn Neth, MD  Assistant:  Gaynelle Arabian, PA-S  Anesthesia:  General endotracheal  Estimated Blood Loss:  10 ml  Specimens:  None  Complications:  None apparent  Indications for Procedure:  This is a 64 y.o. male who requires a port-a-cath for chemotherapy.  The risks of bleeding, infection, injury to surrounding structures, thrombosis, nonfunction, pneumothorax, hemothorax, and need for further procedures were discussed with the patient and was willing to proceed.  Description of Procedure: The patient was correctly identified in the preoperative area and brought into the operating room.  The patient was placed supine with VTE prophylaxis in place.  Appropriate time-outs were performed.  Anesthesia was induced and the patient was intubated.  Appropriate antibiotics were infused.  The left chest and neck were prepped and draped in usual sterile fashion. The patient was placed in Trendelenburg position and local anesthetic was infiltrated into the skin and subcutaneous tissues in the anterior chest wall. The large bore needle was placed into the subclavian vein without difficulty and then the Seldinger wire was advanced. Fluoroscopy was utilized to confirm that the Seldinger wire was in the superior vena cava.  An incision was made and a port pocket developed with blunt and electrocautery dissection. The introducer dilator was placed over the Seldinger wire the wire was removed. The previously flushed catheter was placed into the introducer dilator and the peel-away sheath was removed. The catheter length was confirmed and trimmed utilizing fluoroscopy for proper positioning. The catheter was then attached to the previously flushed port. The port was placed into the pocket. The port was secured in place with  2-0 Prolenes and flushed for function and heparin locked.  The wound was closed with interrupted 3-0 Vicryl followed by 4-0 subcuticular Monocryl sutures and sealed with DermaBond.  The patient was emerged from anesthesia and extubated and brought to the recovery room for further management.  A chest x-ray was ordered.  The patient tolerated the procedure well and all counts were correct at the end of the case.   Melvyn Neth, MD

## 2019-12-28 NOTE — Interval H&P Note (Signed)
History and Physical Interval Note:  12/28/2019 11:50 AM  Chris George  has presented today for surgery, with the diagnosis of Colon Cancer.  The various methods of treatment have been discussed with the patient and family. After consideration of risks, benefits and other options for treatment, the patient has consented to  Procedure(s): INSERTION PORT-A-CATH Left subclavian (N/A) as a surgical intervention.  The patient's history has been reviewed, patient examined, no change in status, stable for surgery.  I have reviewed the patient's chart and labs.  Questions were answered to the patient's satisfaction.     Alexis Mizuno

## 2019-12-28 NOTE — Transfer of Care (Signed)
Immediate Anesthesia Transfer of Care Note  Patient: SHAQUILLA REESE  Procedure(s) Performed: INSERTION PORT-A-CATH Left subclavian (N/A )  Patient Location: PACU  Anesthesia Type:General  Level of Consciousness: sedated  Airway & Oxygen Therapy: Patient Spontanous Breathing and Patient connected to face mask oxygen  Post-op Assessment: Report given to RN and Post -op Vital signs reviewed and stable  Post vital signs: Reviewed and stable  Last Vitals:  Vitals Value Taken Time  BP 112/68 12/28/19 1404  Temp    Pulse 68 12/28/19 1404  Resp 18 12/28/19 1404  SpO2 100 % 12/28/19 1404  Vitals shown include unvalidated device data.  Last Pain:  Vitals:   12/28/19 1404  TempSrc:   PainSc: 0-No pain         Complications: No apparent anesthesia complications

## 2019-12-29 ENCOUNTER — Encounter: Payer: Self-pay | Admitting: Oncology

## 2019-12-29 ENCOUNTER — Other Ambulatory Visit: Payer: Self-pay

## 2019-12-29 NOTE — Anesthesia Postprocedure Evaluation (Signed)
Anesthesia Post Note  Patient: Chris George  Procedure(s) Performed: INSERTION PORT-A-CATH Left subclavian (N/A )  Patient location during evaluation: PACU Anesthesia Type: General Level of consciousness: awake and alert Pain management: pain level controlled Vital Signs Assessment: post-procedure vital signs reviewed and stable Respiratory status: spontaneous breathing, nonlabored ventilation, respiratory function stable and patient connected to nasal cannula oxygen Cardiovascular status: blood pressure returned to baseline and stable Postop Assessment: no apparent nausea or vomiting Anesthetic complications: no     Last Vitals:  Vitals:   12/28/19 1450 12/28/19 1500  BP: 114/70 135/72  Pulse: 67 69  Resp: 17 16  Temp: (!) 36.2 C (!) 36.1 C  SpO2: 99% 100%    Last Pain:  Vitals:   12/28/19 1500  TempSrc: Temporal  PainSc: 0-No pain                 Molli Barrows

## 2019-12-29 NOTE — Progress Notes (Signed)
Confirmed patients name and DOB. Patient has no questions or concerns at this time

## 2019-12-30 ENCOUNTER — Inpatient Hospital Stay (HOSPITAL_BASED_OUTPATIENT_CLINIC_OR_DEPARTMENT_OTHER): Payer: BC Managed Care – PPO | Admitting: Oncology

## 2019-12-30 ENCOUNTER — Inpatient Hospital Stay: Payer: BC Managed Care – PPO

## 2019-12-30 ENCOUNTER — Other Ambulatory Visit: Payer: Self-pay

## 2019-12-30 VITALS — BP 128/86 | HR 69 | Resp 17 | Wt 211.8 lb

## 2019-12-30 DIAGNOSIS — Z5111 Encounter for antineoplastic chemotherapy: Secondary | ICD-10-CM | POA: Diagnosis not present

## 2019-12-30 DIAGNOSIS — C184 Malignant neoplasm of transverse colon: Secondary | ICD-10-CM | POA: Diagnosis not present

## 2019-12-30 DIAGNOSIS — C9000 Multiple myeloma not having achieved remission: Secondary | ICD-10-CM | POA: Diagnosis present

## 2019-12-30 DIAGNOSIS — Z95828 Presence of other vascular implants and grafts: Secondary | ICD-10-CM

## 2019-12-30 LAB — CBC WITH DIFFERENTIAL/PLATELET
Abs Immature Granulocytes: 0.01 10*3/uL (ref 0.00–0.07)
Basophils Absolute: 0 10*3/uL (ref 0.0–0.1)
Basophils Relative: 0 %
Eosinophils Absolute: 0.1 10*3/uL (ref 0.0–0.5)
Eosinophils Relative: 3 %
HCT: 34.6 % — ABNORMAL LOW (ref 39.0–52.0)
Hemoglobin: 10.7 g/dL — ABNORMAL LOW (ref 13.0–17.0)
Immature Granulocytes: 0 %
Lymphocytes Relative: 16 %
Lymphs Abs: 0.6 10*3/uL — ABNORMAL LOW (ref 0.7–4.0)
MCH: 27.7 pg (ref 26.0–34.0)
MCHC: 30.9 g/dL (ref 30.0–36.0)
MCV: 89.6 fL (ref 80.0–100.0)
Monocytes Absolute: 0.4 10*3/uL (ref 0.1–1.0)
Monocytes Relative: 12 %
Neutro Abs: 2.5 10*3/uL (ref 1.7–7.7)
Neutrophils Relative %: 69 %
Platelets: 144 10*3/uL — ABNORMAL LOW (ref 150–400)
RBC: 3.86 MIL/uL — ABNORMAL LOW (ref 4.22–5.81)
RDW: 13.9 % (ref 11.5–15.5)
WBC: 3.6 10*3/uL — ABNORMAL LOW (ref 4.0–10.5)
nRBC: 0 % (ref 0.0–0.2)

## 2019-12-30 LAB — COMPREHENSIVE METABOLIC PANEL
ALT: 12 U/L (ref 0–44)
AST: 21 U/L (ref 15–41)
Albumin: 3.3 g/dL — ABNORMAL LOW (ref 3.5–5.0)
Alkaline Phosphatase: 111 U/L (ref 38–126)
Anion gap: 7 (ref 5–15)
BUN: 14 mg/dL (ref 8–23)
CO2: 28 mmol/L (ref 22–32)
Calcium: 8.2 mg/dL — ABNORMAL LOW (ref 8.9–10.3)
Chloride: 102 mmol/L (ref 98–111)
Creatinine, Ser: 1.35 mg/dL — ABNORMAL HIGH (ref 0.61–1.24)
GFR calc Af Amer: >60 mL/min (ref >60)
GFR calc non Af Amer: 55 mL/min — ABNORMAL LOW (ref >60)
Glucose, Bld: 111 mg/dL — ABNORMAL HIGH (ref 70–99)
Potassium: 3.2 mmol/L — ABNORMAL LOW (ref 3.5–5.1)
Sodium: 137 mmol/L (ref 135–145)
Total Bilirubin: 0.7 mg/dL (ref 0.3–1.2)
Total Protein: 7.3 g/dL (ref 6.5–8.1)

## 2019-12-30 MED ORDER — LEUCOVORIN CALCIUM INJECTION 350 MG
850.0000 mg | Freq: Once | INTRAVENOUS | Status: AC
  Administered 2019-12-30: 850 mg via INTRAVENOUS
  Filled 2019-12-30: qty 42.5

## 2019-12-30 MED ORDER — OXALIPLATIN CHEMO INJECTION 100 MG/20ML
85.0000 mg/m2 | Freq: Once | INTRAVENOUS | Status: AC
  Administered 2019-12-30: 185 mg via INTRAVENOUS
  Filled 2019-12-30: qty 37

## 2019-12-30 MED ORDER — SODIUM CHLORIDE 0.9% FLUSH
10.0000 mL | Freq: Once | INTRAVENOUS | Status: AC
  Administered 2019-12-30: 09:00:00 10 mL via INTRAVENOUS
  Filled 2019-12-30: qty 10

## 2019-12-30 MED ORDER — FLUOROURACIL CHEMO INJECTION 2.5 GM/50ML
400.0000 mg/m2 | Freq: Once | INTRAVENOUS | Status: AC
  Administered 2019-12-30: 850 mg via INTRAVENOUS
  Filled 2019-12-30: qty 17

## 2019-12-30 MED ORDER — DEXTROSE 5 % IV SOLN
Freq: Once | INTRAVENOUS | Status: AC
  Filled 2019-12-30: qty 250

## 2019-12-30 MED ORDER — PALONOSETRON HCL INJECTION 0.25 MG/5ML
0.2500 mg | Freq: Once | INTRAVENOUS | Status: AC
  Administered 2019-12-30: 0.25 mg via INTRAVENOUS
  Filled 2019-12-30: qty 5

## 2019-12-30 MED ORDER — SODIUM CHLORIDE 0.9 % IV SOLN
5000.0000 mg | INTRAVENOUS | Status: DC
  Administered 2019-12-30: 5000 mg via INTRAVENOUS
  Filled 2019-12-30: qty 100

## 2019-12-30 MED ORDER — SODIUM CHLORIDE 0.9 % IV SOLN
10.0000 mg | Freq: Once | INTRAVENOUS | Status: AC
  Administered 2019-12-30: 10 mg via INTRAVENOUS
  Filled 2019-12-30: qty 10

## 2020-01-01 ENCOUNTER — Inpatient Hospital Stay: Payer: BC Managed Care – PPO

## 2020-01-01 ENCOUNTER — Other Ambulatory Visit: Payer: Self-pay

## 2020-01-01 ENCOUNTER — Telehealth: Payer: Self-pay

## 2020-01-01 VITALS — BP 132/75 | HR 50 | Resp 18

## 2020-01-01 DIAGNOSIS — C184 Malignant neoplasm of transverse colon: Secondary | ICD-10-CM

## 2020-01-01 DIAGNOSIS — Z5111 Encounter for antineoplastic chemotherapy: Secondary | ICD-10-CM | POA: Diagnosis not present

## 2020-01-01 MED ORDER — HEPARIN SOD (PORK) LOCK FLUSH 100 UNIT/ML IV SOLN
INTRAVENOUS | Status: AC
  Filled 2020-01-01: qty 5

## 2020-01-01 MED ORDER — SODIUM CHLORIDE 0.9% FLUSH
10.0000 mL | INTRAVENOUS | Status: DC | PRN
  Administered 2020-01-01: 10 mL
  Filled 2020-01-01: qty 10

## 2020-01-01 MED ORDER — HEPARIN SOD (PORK) LOCK FLUSH 100 UNIT/ML IV SOLN
500.0000 [IU] | Freq: Once | INTRAVENOUS | Status: AC | PRN
  Administered 2020-01-01: 500 [IU]
  Filled 2020-01-01: qty 5

## 2020-01-01 NOTE — Telephone Encounter (Signed)
Telephone call to pt for follow up after receiving first chemo.  Went to voice mail but unable to leave message due to Mail box is full.

## 2020-01-02 NOTE — Progress Notes (Deleted)
[name]  [phone] [phone]  ID: Chris George OB: 1956/09/21  MR#: [id number]  FWY#:[id number]  Patient Care Team: Sofie Hartigan, MD as PCP - General (Family Medicine)  I connected with Chris George on 01/02/20 at  2:30 PM EST by {Blank single:19197::"video enabled telemedicine visit","telephone visit"} and verified that I am speaking with the correct person using two identifiers.   I discussed the limitations, risks, security and privacy concerns of performing an evaluation and management service by telemedicine and the availability of in-person appointments. I also discussed with the patient that there may be a patient responsible charge related to this service. The patient expressed understanding and agreed to proceed.   Other persons participating in the visit and their role in the encounter: Patient, MD.  Patient's location: Home. Provider's location: Clinic.  CHIEF COMPLAINT: Stage IIIc colon cancer, smoldering myeloma.  INTERVAL HISTORY: Patient return to clinic today for further evaluation and initiation of cycle 1 of 12 of adjuvant FOLFOX. He is anxious, but otherwise feels well. He has no neurologic complaints. He does not complain of weakness or fatigue today.  He has a good appetite and denies weight loss.  He has no chest pain, shortness of breath, cough, or hemoptysis.  He denies any nausea, vomiting, constipation, or diarrhea.  He has no melena or hematochezia.  He has no urinary complaints. Patient offers no further specific complaints today.  REVIEW OF SYSTEMS:   Review of Systems  Constitutional: Negative.  Negative for fever, malaise/fatigue and weight loss.  Respiratory: Negative.  Negative for cough, hemoptysis and shortness of breath.   Cardiovascular: Negative.  Negative for chest pain and leg swelling.  Gastrointestinal: Negative.  Negative for abdominal pain, blood in stool and melena.  Genitourinary:  Negative.  Negative for hematuria.  Musculoskeletal: Negative.  Negative for back pain.  Skin: Negative.  Negative for rash.  Neurological: Negative.  Negative for dizziness, focal weakness, weakness and headaches.  Psychiatric/Behavioral: The patient is nervous/anxious.     As per HPI. Otherwise, a complete review of systems is negative.  PAST MEDICAL HISTORY: Past Medical History:  Diagnosis Date  . A-fib ([place])   . Anemia   . Arthritis    ankles  . Cancer ([place])    multiple myeloma  . 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[place]) 10/26/2019  . Cardiomyopathy ([place])   . CHF (congestive heart failure) ([place])   . Depression   . Dysrhythmia    atrial fib  . HLD (hyperlipidemia)   . Hypercholesteremia   . Hypertension   . Moderate tricuspid insufficiency   . Multiple myeloma ([place])    not being treated right now per pt 11/11/19  . Pneumonia   . Sleep apnea    No CPAP    PAST SURGICAL HISTORY: Past Surgical History:  Procedure Laterality Date  . ATRIAL ABLATION SURGERY    . CARDIAC SURGERY     A-Fib Ablations  . COLONOSCOPY WITH PROPOFOL N/A 10/26/2019   Procedure: COLONOSCOPY WITH BIOPSY;  Surgeon: Lucilla Lame, MD;  Location: [facility];  Service: Endoscopy;  Laterality: N/A;  . ESOPHAGOGASTRODUODENOSCOPY (EGD) WITH PROPOFOL N/A 10/26/2019   Procedure: ESOPHAGOGASTRODUODENOSCOPY (EGD) WITH BIOPSY;  Surgeon: Lucilla Lame, MD;  Location: [facility];  Service: Endoscopy;  Laterality: N/A;  sleep apnea  . LAPAROSCOPIC PARTIAL COLECTOMY Right 11/17/2019   Procedure: LAPAROSCOPIC PARTIAL COLECTOMY RIGHT EXTENDED;  Surgeon: Olean Ree, MD;  Location: [facility];  Service: General;  Laterality: Right;  .  PORTACATH PLACEMENT N/A 12/28/2019   Procedure: INSERTION PORT-A-CATH Left subclavian;  Surgeon: Olean Ree, MD;  Location: [facility];  Service: General;  Laterality: N/A;    FAMILY HISTORY: Family History  Problem Relation Age of Onset  . Healthy Mother     ADVANCED  DIRECTIVES (Y/N):  N  HEALTH MAINTENANCE: Social History   Tobacco Use  . Smoking status: Never Smoker  . Smokeless tobacco: Never Used  Substance Use Topics  . Alcohol use: Not Currently  . Drug use: Never     Colonoscopy:  PAP:  Bone density:  Lipid panel:  No Known Allergies  Current Outpatient Medications  Medication Sig Dispense Refill  . APPLE CIDER VINEGAR PO Take 30-45 mLs by mouth every other day.     Marland Kitchen aspirin 325 MG EC tablet Take 325 mg by mouth every other day. In the morning    . digoxin (DIGOX) 0.25 MG tablet Take 0.25 mg by mouth daily.     . furosemide (LASIX) 80 MG tablet Take 80 mg by mouth 2 (two) times daily. Morning & late afternoon    . lidocaine-prilocaine (EMLA) cream Apply to affected area once 30 g 3  . Misc Natural Products (TART CHERRY ADVANCED PO) Take 60 mLs by mouth every other day.    . ondansetron (ZOFRAN) 8 MG tablet Take 1 tablet (8 mg total) by mouth 2 (two) times daily as needed for refractory nausea / vomiting. (Patient not taking: Reported on 12/29/2019) 60 tablet 2  . oxyCODONE (OXY IR/ROXICODONE) 5 MG immediate release tablet Take 1 tablet (5 mg total) by mouth every 4 (four) hours as needed for moderate pain or severe pain. (Patient not taking: Reported on 12/29/2019) 30 tablet 0  . potassium bicarbonate (KLOR-CON/EF) 25 MEQ disintegrating tablet Take 25 mEq by mouth 4 (four) times a week. In the afternoon.    . prochlorperazine (COMPAZINE) 10 MG tablet Take 1 tablet (10 mg total) by mouth every 6 (six) hours as needed (Nausea or vomiting). (Patient not taking: Reported on 12/29/2019) 60 tablet 2   No current facility-administered medications for this visit.    OBJECTIVE: There were no vitals filed for this visit.   There is no height or weight on file to calculate BMI.    ECOG FS:0 - Asymptomatic  General: Well-developed, well-nourished, no acute distress. Eyes: Pink conjunctiva, anicteric sclera. HEENT: Normocephalic, moist mucous  membranes. Lungs: No audible wheezing or coughing. Heart: Regular rate and rhythm. Abdomen: Soft, nontender, no obvious distention. Musculoskeletal: No edema, cyanosis, or clubbing. Neuro: Alert, answering all questions appropriately. Cranial nerves grossly intact. Skin: No rashes or petechiae noted. Psych: Normal affect.   LAB RESULTS:  Lab Results  Component Value Date   NA 137 12/30/2019   K 3.2 (L) 12/30/2019   CL 102 12/30/2019   CO2 28 12/30/2019   GLUCOSE 111 (H) 12/30/2019   BUN 14 12/30/2019   CREATININE 1.35 (H) 12/30/2019   CALCIUM 8.2 (L) 12/30/2019   PROT 7.3 12/30/2019   ALBUMIN 3.3 (L) 12/30/2019   AST 21 12/30/2019   ALT 12 12/30/2019   ALKPHOS 111 12/30/2019   BILITOT 0.7 12/30/2019   GFRNONAA 55 (L) 12/30/2019   GFRAA >60 12/30/2019    Lab Results  Component Value Date   WBC 3.6 (L) 12/30/2019   NEUTROABS 2.5 12/30/2019   HGB 10.7 (L) 12/30/2019   HCT 34.6 (L) 12/30/2019   MCV 89.6 12/30/2019   PLT 144 (L) 12/30/2019   Lab Results  Component  Value Date   IRON 35 (L) 09/09/2019   TIBC 357 09/09/2019   IRONPCTSAT 10 (L) 09/09/2019   Lab Results  Component Value Date   FERRITIN 11 (L) 09/09/2019     STUDIES: DG Chest Port 1 View  Result Date: 12/28/2019 CLINICAL DATA:  Port-A-Cath placement. Multiple myeloma. Transverse colon cancer. EXAM: PORTABLE CHEST 1 VIEW COMPARISON:  CT chest 11/11/2019. FINDINGS: Trachea is midline. Heart is enlarged. Left-sided Port-A-Cath is in the SVC. No pneumothorax. Mild mixed interstitial and airspace opacification. No definite pleural fluid. IMPRESSION: 1. No pneumothorax after left-sided Port-A-Cath placement. 2. Suspect mild pulmonary edema. Electronically Signed   By: Lorin Picket M.D.   On: 12/28/2019 14:30   DG C-Arm 1-60 Min-No Report  Result Date: 12/28/2019 Fluoroscopy was utilized by the requesting physician.  No radiographic interpretation.    ASSESSMENT: Stage IIIc colon cancer, smoldering  myeloma.  PLAN:    1.  Stage IIIc colon cancer: Imaging and final pathology reviewed independently confirming stage of disease.  Patient has agreed to adjuvant FOLFOX chemotherapy every 2 weeks for a total of 12 cycles. Patient has now had port placement. Proceed with cycle 1 of 12 of adjuvant FOLFOX today. Return to clinic in 2 days for pump removal, in 1 week with laboratory work and video assisted telemedicine visit, and then in 2 weeks for further evaluation and consideration of cycle 2.  2. Smoldering myeloma: Patient was initially diagnosed and treated in [place], [place] and treated with single agent Velcade in approximately 2013.  He was evaluated in clinic in July 2014 when he declined any maintenance treatment or referral for bone marrow transplant.  He was subsequently lost to follow-up.  His most recent SPEP revealed an M spike of 2.0 with an IgG predominance of 2949.  These are unchanged from 3 months prior.  He also has a suppressed IgA level.  Kappa/lambda light chain ratio has trended up slightly to 2.17.  He has mild renal insufficiency, but no other evidence of endorgan damage.  Metastatic bone survey on May 20, 2019 reviewed independently with no obvious bony lesions.  Bone marrow biopsy completed on May 28, 2019 revealed only 10 to 15% plasma cells with no clonality reported.  Cytogenetics were also reported as normal.  No intervention is needed at this time.  Continue to monitor closely. 3.  Iron deficiency anemia: Likely secondary to recently diagnosed colon cancer. Chronic and unchanged. Patient's hemoglobin is 10.7 today. He last received IV Feraheme on October 06, 2019.   4.  Renal insufficiency: Creatinine is slightly improved to 1.35, monitor. 5.  Peripheral neuropathy: Chronic and unchanged.  Patient does not complain of this today. 6. Leukopenia: Mild, monitor. 7. Thrombocytopenia: Mild, monitor.  I provided *** minutes of {Blank single:19197::"face-to-face video visit  time","non face-to-face telephone visit time"} during this encounter which included chart review, counseling, and coordination of care as documented above.   Patient expressed understanding and was in agreement with this plan. He also understands that He can call clinic at any time with any questions, concerns, or complaints.    Lloyd Huger, MD   01/02/2020 9:16 AM

## 2020-01-05 ENCOUNTER — Inpatient Hospital Stay: Payer: BC Managed Care – PPO

## 2020-01-05 ENCOUNTER — Inpatient Hospital Stay: Payer: BC Managed Care – PPO | Admitting: Oncology

## 2020-01-08 NOTE — Addendum Note (Signed)
Addended by: Faythe Casa E on: 01/08/2020 01:55 PM   Modules accepted: Level of Service

## 2020-01-10 NOTE — Progress Notes (Signed)
[facility]  Telephone:(336) [phone] Fax:(336) [phone]  ID: Chris George OB: 18-Aug-1956  MR#: [id number]  MNO#:[id number]  Patient Care Team: Sofie Hartigan, MD as PCP - General (Family Medicine)  CHIEF COMPLAINT: Stage IIIc colon cancer, smoldering myeloma.  INTERVAL HISTORY: Patient returns to clinic today for further evaluation and consideration of cycle 2 of 12 of adjuvant FOLFOX.  He tolerated his first treatment well without significant side effects.  He continues to remain active and reports playing tennis 2-3 times per week. He has no neurologic complaints. He does not complain of weakness or fatigue today.  He has a good appetite and denies weight loss.  He has no chest pain, shortness of breath, cough, or hemoptysis.  He denies any nausea, vomiting, constipation, or diarrhea.  He has no melena or hematochezia.  He has no urinary complaints.  Patient offers no specific complaints today.  REVIEW OF SYSTEMS:   Review of Systems  Constitutional: Negative.  Negative for fever, malaise/fatigue and weight loss.  Respiratory: Negative.  Negative for cough, hemoptysis and shortness of breath.   Cardiovascular: Negative.  Negative for chest pain and leg swelling.  Gastrointestinal: Negative.  Negative for abdominal pain, blood in stool and melena.  Genitourinary: Negative.  Negative for hematuria.  Musculoskeletal: Negative.  Negative for back pain.  Skin: Negative.  Negative for rash.  Neurological: Negative.  Negative for dizziness, focal weakness, weakness and headaches.  Psychiatric/Behavioral: Negative.  The patient is not nervous/anxious.     As per HPI. Otherwise, a complete review of systems is negative.  PAST MEDICAL HISTORY: Past Medical History:  Diagnosis Date  . A-fib ([place])   . Anemia   . Arthritis    ankles  . Cancer ([place])    multiple myeloma  . 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 Cardiomyopathy ([place])   . CHF (congestive heart  failure) ([place])   . Depression   . Dysrhythmia    atrial fib  . HLD (hyperlipidemia)   . Hypercholesteremia   . Hypertension   . Moderate tricuspid insufficiency   . Multiple myeloma ([place])    not being treated right now per pt 11/11/19  . Pneumonia   . Sleep apnea    No CPAP    PAST SURGICAL HISTORY: Past Surgical History:  Procedure Laterality Date  . ATRIAL ABLATION SURGERY    . CARDIAC SURGERY     A-Fib Ablations  . COLONOSCOPY WITH PROPOFOL N/A 10/26/2019   Procedure: COLONOSCOPY WITH BIOPSY;  Surgeon: Lucilla Lame, MD;  Location: [facility];  Service: Endoscopy;  Laterality: N/A;  . ESOPHAGOGASTRODUODENOSCOPY (EGD) WITH PROPOFOL N/A 10/26/2019   Procedure: ESOPHAGOGASTRODUODENOSCOPY (EGD) WITH BIOPSY;  Surgeon: Lucilla Lame, MD;  Location: [facility];  Service: Endoscopy;  Laterality: N/A;  sleep apnea  . LAPAROSCOPIC PARTIAL COLECTOMY Right 11/17/2019   Procedure: LAPAROSCOPIC PARTIAL COLECTOMY RIGHT EXTENDED;  Surgeon: Olean Ree, MD;  Location: [facility];  Service: General;  Laterality: Right;  . PORTACATH PLACEMENT N/A 12/28/2019   Procedure: INSERTION PORT-A-CATH Left subclavian;  Surgeon: Olean Ree, MD;  Location: [facility];  Service: General;  Laterality: N/A;    FAMILY HISTORY: Family History  Problem Relation Age of Onset  . Healthy Mother     ADVANCED DIRECTIVES (Y/N):  N  HEALTH MAINTENANCE: Social History   Tobacco Use  . Smoking status: Never Smoker  . Smokeless tobacco: Never Used  Substance Use Topics  . Alcohol use: Not Currently  . Drug use: Never  Colonoscopy:  PAP:  Bone density:  Lipid panel:  No Known Allergies  Current Outpatient Medications  Medication Sig Dispense Refill  . APPLE CIDER VINEGAR PO Take 30-45 mLs by mouth every other day.     Marland Kitchen aspirin 325 MG EC tablet Take 325 mg by mouth every other day. In the morning    . digoxin (DIGOX) 0.25 MG tablet Take 0.25 mg by mouth daily.     . furosemide  (LASIX) 80 MG tablet Take 80 mg by mouth 2 (two) times daily. Morning & late afternoon    . lidocaine-prilocaine (EMLA) cream Apply to affected area once 30 g 3  . Misc Natural Products (TART CHERRY ADVANCED PO) Take 60 mLs by mouth every other day.    . potassium bicarbonate (KLOR-CON/EF) 25 MEQ disintegrating tablet Take 25 mEq by mouth 4 (four) times a week. In the afternoon.    . ondansetron (ZOFRAN) 8 MG tablet Take 1 tablet (8 mg total) by mouth 2 (two) times daily as needed for refractory nausea / vomiting. (Patient not taking: Reported on 12/29/2019) 60 tablet 2  . oxyCODONE (OXY IR/ROXICODONE) 5 MG immediate release tablet Take 1 tablet (5 mg total) by mouth every 4 (four) hours as needed for moderate pain or severe pain. (Patient not taking: Reported on 12/29/2019) 30 tablet 0  . prochlorperazine (COMPAZINE) 10 MG tablet Take 1 tablet (10 mg total) by mouth every 6 (six) hours as needed (Nausea or vomiting). (Patient not taking: Reported on 12/29/2019) 60 tablet 2   No current facility-administered medications for this visit.   Facility-Administered Medications Ordered in Other Visits  Medication Dose Route Frequency Provider Last Rate Last Admin  . fluorouracil (ADRUCIL) 5,150 mg in sodium chloride 0.9 % 147 mL chemo infusion  2,400 mg/m2 (Treatment Plan Recorded) Intravenous 1 day or 1 dose Lloyd Huger, MD   5,150 mg at 01/13/20 1330  . heparin lock flush 100 unit/mL  500 Units Intravenous Once Lloyd Huger, MD        OBJECTIVE: Vitals:   01/13/20 0902  BP: 119/76  Pulse: (!) 57  Resp: 18  Temp: (!) 96 F (35.6 C)     There is no height or weight on file to calculate BMI.    ECOG FS:0 - Asymptomatic  General: Well-developed, well-nourished, no acute distress. Eyes: Pink conjunctiva, anicteric sclera. HEENT: Normocephalic, moist mucous membranes. Lungs: No audible wheezing or coughing. Heart: Regular rate and rhythm. Abdomen: Soft, nontender, no obvious  distention. Musculoskeletal: No edema, cyanosis, or clubbing. Neuro: Alert, answering all questions appropriately. Cranial nerves grossly intact. Skin: No rashes or petechiae noted. Psych: Normal affect.   LAB RESULTS:  Lab Results  Component Value Date   NA 139 01/13/2020   K 2.9 (L) 01/13/2020   CL 106 01/13/2020   CO2 26 01/13/2020   GLUCOSE 97 01/13/2020   BUN 16 01/13/2020   CREATININE 1.44 (H) 01/13/2020   CALCIUM 7.8 (L) 01/13/2020   PROT 7.2 01/13/2020   ALBUMIN 3.4 (L) 01/13/2020   AST 23 01/13/2020   ALT 15 01/13/2020   ALKPHOS 97 01/13/2020   BILITOT 0.4 01/13/2020   GFRNONAA 51 (L) 01/13/2020   GFRAA 59 (L) 01/13/2020    Lab Results  Component Value Date   WBC 2.8 (L) 01/13/2020   NEUTROABS 1.6 (L) 01/13/2020   HGB 10.4 (L) 01/13/2020   HCT 33.6 (L) 01/13/2020   MCV 87.3 01/13/2020   PLT 121 (L) 01/13/2020   Lab Results  Component Value Date   IRON 35 (L) 09/09/2019   TIBC 357 09/09/2019   IRONPCTSAT 10 (L) 09/09/2019   Lab Results  Component Value Date   FERRITIN 11 (L) 09/09/2019     STUDIES: DG Chest Port 1 View  Result Date: 12/28/2019 CLINICAL DATA:  Port-A-Cath placement. Multiple myeloma. Transverse colon cancer. EXAM: PORTABLE CHEST 1 VIEW COMPARISON:  CT chest 11/11/2019. FINDINGS: Trachea is midline. Heart is enlarged. Left-sided Port-A-Cath is in the SVC. No pneumothorax. Mild mixed interstitial and airspace opacification. No definite pleural fluid. IMPRESSION: 1. No pneumothorax after left-sided Port-A-Cath placement. 2. Suspect mild pulmonary edema. Electronically Signed   By: Lorin Picket M.D.   On: 12/28/2019 14:30   DG C-Arm 1-60 Min-No Report  Result Date: 12/28/2019 Fluoroscopy was utilized by the requesting physician.  No radiographic interpretation.    ASSESSMENT: Stage IIIc colon cancer, smoldering myeloma.  PLAN:    1.  Stage IIIc colon cancer: Imaging and final pathology reviewed independently confirming stage of  disease.  Patient has agreed to adjuvant FOLFOX chemotherapy every 2 weeks for a total of 12 cycles. Patient has now had port placement.  Proceed with cycle 2 of FOLFOX today.  Return to clinic in 2 days for pump removal and then in 2 weeks for further evaluation and consideration of cycle 3. 2. Smoldering myeloma: Patient was initially diagnosed and treated in [place], [place] and treated with single agent Velcade in approximately 2013.  He was evaluated in clinic in July 2014 when he declined any maintenance treatment or referral for bone marrow transplant.  He was subsequently lost to follow-up.  His most recent SPEP revealed an M spike of 2.0 with an IgG predominance of 2949.  These are unchanged from 3 months prior.  He also has a suppressed IgA level.  Kappa/lambda light chain ratio has trended up slightly to 2.17.  He has mild renal insufficiency, but no other evidence of endorgan damage.  Metastatic bone survey on May 20, 2019 reviewed independently with no obvious bony lesions.  Bone marrow biopsy completed on May 28, 2019 revealed only 10 to 15% plasma cells with no clonality reported.  Cytogenetics were also reported as normal.  No intervention is needed at this time.  Continue to monitor closely. 3.  Iron deficiency anemia: Likely secondary to recently diagnosed colon cancer. Chronic and unchanged.  Patient's hemoglobin is 10.4 today.  He last received IV Feraheme on October 06, 2019.   4.  Renal insufficiency: Creatinine trended up slightly to 1.44, monitor. 5.  Peripheral neuropathy: Chronic and unchanged.  Patient does not complain of this today. 6. Leukopenia: Mild, monitor. 7. Thrombocytopenia: Mild, monitor. 8.  Hypokalemia: Patient reports he was not compliant with his potassium supplementation.  Continue as prescribed.  Patient expressed understanding and was in agreement with this plan. He also understands that He can call clinic at any time with any questions, concerns, or  complaints.    Lloyd Huger, MD   01/13/2020 2:39 PM

## 2020-01-11 ENCOUNTER — Other Ambulatory Visit: Payer: BC Managed Care – PPO

## 2020-01-13 ENCOUNTER — Inpatient Hospital Stay (HOSPITAL_BASED_OUTPATIENT_CLINIC_OR_DEPARTMENT_OTHER): Payer: BC Managed Care – PPO | Admitting: Oncology

## 2020-01-13 ENCOUNTER — Inpatient Hospital Stay: Payer: BC Managed Care – PPO | Attending: Oncology

## 2020-01-13 ENCOUNTER — Encounter: Payer: Self-pay | Admitting: Surgery

## 2020-01-13 ENCOUNTER — Other Ambulatory Visit: Payer: Self-pay

## 2020-01-13 ENCOUNTER — Encounter: Payer: BC Managed Care – PPO | Admitting: Surgery

## 2020-01-13 ENCOUNTER — Encounter: Payer: Self-pay | Admitting: Oncology

## 2020-01-13 ENCOUNTER — Ambulatory Visit (INDEPENDENT_AMBULATORY_CARE_PROVIDER_SITE_OTHER): Payer: Self-pay | Admitting: Surgery

## 2020-01-13 ENCOUNTER — Inpatient Hospital Stay: Payer: BC Managed Care – PPO

## 2020-01-13 VITALS — BP 123/74 | HR 55 | Temp 97.2°F | Resp 14 | Ht 69.0 in | Wt 210.0 lb

## 2020-01-13 VITALS — BP 119/76 | HR 57 | Temp 96.0°F | Resp 18

## 2020-01-13 DIAGNOSIS — C184 Malignant neoplasm of transverse colon: Secondary | ICD-10-CM

## 2020-01-13 DIAGNOSIS — Z5111 Encounter for antineoplastic chemotherapy: Secondary | ICD-10-CM | POA: Insufficient documentation

## 2020-01-13 DIAGNOSIS — N289 Disorder of kidney and ureter, unspecified: Secondary | ICD-10-CM | POA: Insufficient documentation

## 2020-01-13 DIAGNOSIS — D696 Thrombocytopenia, unspecified: Secondary | ICD-10-CM | POA: Diagnosis not present

## 2020-01-13 DIAGNOSIS — G629 Polyneuropathy, unspecified: Secondary | ICD-10-CM | POA: Diagnosis not present

## 2020-01-13 DIAGNOSIS — C9000 Multiple myeloma not having achieved remission: Secondary | ICD-10-CM | POA: Diagnosis not present

## 2020-01-13 DIAGNOSIS — E876 Hypokalemia: Secondary | ICD-10-CM | POA: Insufficient documentation

## 2020-01-13 DIAGNOSIS — D72819 Decreased white blood cell count, unspecified: Secondary | ICD-10-CM | POA: Insufficient documentation

## 2020-01-13 DIAGNOSIS — Z09 Encounter for follow-up examination after completed treatment for conditions other than malignant neoplasm: Secondary | ICD-10-CM

## 2020-01-13 DIAGNOSIS — D509 Iron deficiency anemia, unspecified: Secondary | ICD-10-CM | POA: Insufficient documentation

## 2020-01-13 LAB — COMPREHENSIVE METABOLIC PANEL WITH GFR
ALT: 15 U/L (ref 0–44)
AST: 23 U/L (ref 15–41)
Albumin: 3.4 g/dL — ABNORMAL LOW (ref 3.5–5.0)
Alkaline Phosphatase: 97 U/L (ref 38–126)
Anion gap: 7 (ref 5–15)
BUN: 16 mg/dL (ref 8–23)
CO2: 26 mmol/L (ref 22–32)
Calcium: 7.8 mg/dL — ABNORMAL LOW (ref 8.9–10.3)
Chloride: 106 mmol/L (ref 98–111)
Creatinine, Ser: 1.44 mg/dL — ABNORMAL HIGH (ref 0.61–1.24)
GFR calc Af Amer: 59 mL/min — ABNORMAL LOW
GFR calc non Af Amer: 51 mL/min — ABNORMAL LOW
Glucose, Bld: 97 mg/dL (ref 70–99)
Potassium: 2.9 mmol/L — ABNORMAL LOW (ref 3.5–5.1)
Sodium: 139 mmol/L (ref 135–145)
Total Bilirubin: 0.4 mg/dL (ref 0.3–1.2)
Total Protein: 7.2 g/dL (ref 6.5–8.1)

## 2020-01-13 LAB — CBC WITH DIFFERENTIAL/PLATELET
Abs Immature Granulocytes: 0.01 10*3/uL (ref 0.00–0.07)
Basophils Absolute: 0 10*3/uL (ref 0.0–0.1)
Basophils Relative: 0 %
Eosinophils Absolute: 0.1 10*3/uL (ref 0.0–0.5)
Eosinophils Relative: 4 %
HCT: 33.6 % — ABNORMAL LOW (ref 39.0–52.0)
Hemoglobin: 10.4 g/dL — ABNORMAL LOW (ref 13.0–17.0)
Immature Granulocytes: 0 %
Lymphocytes Relative: 21 %
Lymphs Abs: 0.6 10*3/uL — ABNORMAL LOW (ref 0.7–4.0)
MCH: 27 pg (ref 26.0–34.0)
MCHC: 31 g/dL (ref 30.0–36.0)
MCV: 87.3 fL (ref 80.0–100.0)
Monocytes Absolute: 0.5 10*3/uL (ref 0.1–1.0)
Monocytes Relative: 17 %
Neutro Abs: 1.6 10*3/uL — ABNORMAL LOW (ref 1.7–7.7)
Neutrophils Relative %: 58 %
Platelets: 121 10*3/uL — ABNORMAL LOW (ref 150–400)
RBC: 3.85 MIL/uL — ABNORMAL LOW (ref 4.22–5.81)
RDW: 13.5 % (ref 11.5–15.5)
WBC: 2.8 10*3/uL — ABNORMAL LOW (ref 4.0–10.5)
nRBC: 0 % (ref 0.0–0.2)

## 2020-01-13 MED ORDER — LEUCOVORIN CALCIUM INJECTION 350 MG: 400.0000 mg/m2 | Freq: Once | INTRAVENOUS | Status: DC

## 2020-01-13 MED ORDER — FLUOROURACIL CHEMO INJECTION 2.5 GM/50ML
400.0000 mg/m2 | Freq: Once | INTRAVENOUS | Status: AC
  Administered 2020-01-13: 850 mg via INTRAVENOUS
  Filled 2020-01-13: qty 17

## 2020-01-13 MED ORDER — LEUCOVORIN CALCIUM INJECTION 350 MG
850.0000 mg | Freq: Once | INTRAVENOUS | Status: AC
  Administered 2020-01-13: 850 mg via INTRAVENOUS
  Filled 2020-01-13: qty 25

## 2020-01-13 MED ORDER — DEXTROSE 5 % IV SOLN
Freq: Once | INTRAVENOUS | Status: AC
  Filled 2020-01-13: qty 250

## 2020-01-13 MED ORDER — OXALIPLATIN CHEMO INJECTION 100 MG/20ML
85.0000 mg/m2 | Freq: Once | INTRAVENOUS | Status: AC
  Administered 2020-01-13: 185 mg via INTRAVENOUS
  Filled 2020-01-13: qty 37

## 2020-01-13 MED ORDER — HEPARIN SOD (PORK) LOCK FLUSH 100 UNIT/ML IV SOLN
500.0000 [IU] | Freq: Once | INTRAVENOUS | Status: DC
  Filled 2020-01-13: qty 5

## 2020-01-13 MED ORDER — SODIUM CHLORIDE 0.9 % IV SOLN
10.0000 mg | Freq: Once | INTRAVENOUS | Status: AC
  Administered 2020-01-13: 10 mg via INTRAVENOUS
  Filled 2020-01-13: qty 10

## 2020-01-13 MED ORDER — PALONOSETRON HCL INJECTION 0.25 MG/5ML
0.2500 mg | Freq: Once | INTRAVENOUS | Status: AC
  Administered 2020-01-13: 0.25 mg via INTRAVENOUS
  Filled 2020-01-13: qty 5

## 2020-01-13 MED ORDER — SODIUM CHLORIDE 0.9% FLUSH
10.0000 mL | Freq: Once | INTRAVENOUS | Status: AC
  Administered 2020-01-13: 10 mL via INTRAVENOUS
  Filled 2020-01-13: qty 10

## 2020-01-13 MED ORDER — SODIUM CHLORIDE 0.9 % IV SOLN
2400.0000 mg/m2 | INTRAVENOUS | Status: DC
  Administered 2020-01-13: 5150 mg via INTRAVENOUS
  Filled 2020-01-13: qty 100

## 2020-01-13 NOTE — Progress Notes (Signed)
01/13/2020  HPI: Chris George is a 64 y.o. male s/p left subclavian Port-A-Cath placement on 12/28/2019.  This was done for transverse colon cancer he underwent laparoscopic extended right colectomy on 11/17/2019.  He presents today for follow-up.  He had another round of chemotherapy today and has his port accessed at this point.  He reports that he is doing well with some weakness but otherwise is tolerating chemotherapy well.  Denies any issues with incisions either from his colectomy or his Port-A-Cath.  Vital signs: BP 123/74   Pulse (!) 55   Temp (!) 97.2 F (36.2 C) (Temporal)   Resp 14   Ht 5\' 9"  (1.753 m)   Wt 210 lb (95.3 kg)   SpO2 97%   BMI 31.01 kg/m    Physical Exam: Constitutional: No acute distress Abdomen: Soft, obese, nondistended, nontender to palpation.  Incisions are well-healed with no evidence of hernia. Skin: Left subclavian Port-A-Cath incision site is healing well with port currently accessed and dressed appropriately.  No evidence of infection or wound breakdown.  Assessment/Plan: This is a 64 y.o. male s/p left subclavian Port-A-Cath placement as well as laparoscopic extended right colectomy.  -Patient is healing well from both surgical standpoint.  There is no evident complication from his surgeries. -Discussed with him that as per the surveillance for his colon cancer, we would set up an appointment with him in 2 more months that would mark 4 months from his initial surgery and we will also obtain a CEA at this point.  Discussed with him that overall we will do a colonoscopy approximately 1 year from his surgery as well as a CT scan of chest abdomen pelvis for surveillance. -In the meantime encouraged the patient to continue eating well, staying active as tolerated, and taking it 1 day at a time. -He will follow-up in 2 months.   Melvyn Neth, [facility]

## 2020-01-13 NOTE — Patient Instructions (Addendum)
Patient will follow up in two months with Dr.Piscoya. Patient will have CEA lab done prior to the appointment.  Please contact our office if you have any questions or concerns.  GENERAL POST-OPERATIVE PATIENT INSTRUCTIONS   FOLLOW-UP:  Please make an appointment with your physician in.  Call your physician immediately if you have any fevers greater than 102.5, drainage from you wound that is not clear or looks infected, persistent bleeding, increasing abdominal pain, problems urinating, or persistent nausea/vomiting.    WOUND CARE INSTRUCTIONS:  Keep a dry clean dressing on the wound if there is drainage. The initial bandage may be removed after 24 hours.  Once the wound has quit draining you may leave it open to air.  If clothing rubs against the wound or causes irritation and the wound is not draining you may cover it with a dry dressing during the daytime.  Try to keep the wound dry and avoid ointments on the wound unless directed to do so.  If the wound becomes bright red and painful or starts to drain infected material that is not clear, please contact your physician immediately.  If the wound is mildly pink and has a thick firm ridge underneath it, this is normal, and is referred to as a healing ridge.  This will resolve over the next 4-6 weeks.  DIET:  You may eat any foods that you can tolerate.  It is a good idea to eat a high fiber diet and take in plenty of fluids to prevent constipation.  If you do become constipated you may want to take a mild laxative or take ducolax tablets on a daily basis until your bowel habits are regular.  Constipation can be very uncomfortable, along with straining, after recent surgery.  ACTIVITY:  You are encouraged to cough and deep breath or use your incentive spirometer if you were given one, every 15-30 minutes when awake.  This will help prevent respiratory complications and low grade fevers post-operatively if you had a general anesthetic.  You may want to  hug a pillow when coughing and sneezing to add additional support to the surgical area, if you had abdominal or chest surgery, which will decrease pain during these times.  You are encouraged to walk and engage in light activity for the next two weeks.  You should not lift more than 20 pounds during this time frame as it could put you at increased risk for complications.  Twenty pounds is roughly equivalent to a plastic bag of groceries.    MEDICATIONS:  Try to take narcotic medications and anti-inflammatory medications, such as tylenol, ibuprofen, naprosyn, etc., with food.  This will minimize stomach upset from the medication.  Should you develop nausea and vomiting from the pain medication, or develop a rash, please discontinue the medication and contact your physician.  You should not drive, make important decisions, or operate machinery when taking narcotic pain medication.  QUESTIONS:  Please feel free to call your physician or the hospital operator if you have any questions, and they will be glad to assist you.

## 2020-01-15 ENCOUNTER — Ambulatory Visit: Payer: BC Managed Care – PPO | Admitting: Oncology

## 2020-01-15 ENCOUNTER — Inpatient Hospital Stay: Payer: BC Managed Care – PPO

## 2020-01-15 ENCOUNTER — Ambulatory Visit: Payer: BC Managed Care – PPO

## 2020-01-15 DIAGNOSIS — C184 Malignant neoplasm of transverse colon: Secondary | ICD-10-CM

## 2020-01-15 DIAGNOSIS — Z5111 Encounter for antineoplastic chemotherapy: Secondary | ICD-10-CM | POA: Diagnosis not present

## 2020-01-15 MED ORDER — SODIUM CHLORIDE 0.9% FLUSH
10.0000 mL | INTRAVENOUS | Status: DC | PRN
  Administered 2020-01-15: 10 mL
  Filled 2020-01-15: qty 10

## 2020-01-15 MED ORDER — HEPARIN SOD (PORK) LOCK FLUSH 100 UNIT/ML IV SOLN
500.0000 [IU] | Freq: Once | INTRAVENOUS | Status: AC | PRN
  Administered 2020-01-15: 500 [IU]
  Filled 2020-01-15: qty 5

## 2020-01-15 MED ORDER — HEPARIN SOD (PORK) LOCK FLUSH 100 UNIT/ML IV SOLN
INTRAVENOUS | Status: AC
  Filled 2020-01-15: qty 5

## 2020-01-22 NOTE — Progress Notes (Signed)
[facility]  Telephone:(336) [phone] Fax:(336) [phone]  ID: [facility] OB: 01/14/1956  MR#: [id number]  DXI#:[id number]  Patient Care Team: Sofie Hartigan, MD as PCP - General (Family Medicine)  CHIEF COMPLAINT: Stage IIIc colon cancer, smoldering myeloma.  INTERVAL HISTORY: Patient returns to clinic today for further evaluation and consideration of cycle 3 of 12 of adjuvant FOLFOX.  He is tolerating his treatments well without significant side effects.  He continues to remain active and playing tennis throughout the week.  He has no neurologic complaints. He does not complain of weakness or fatigue today.  He has a good appetite and denies weight loss.  He has no chest pain, shortness of breath, cough, or hemoptysis.  He denies any nausea, vomiting, constipation, or diarrhea.  He has no melena or hematochezia.  He has no urinary complaints.  Patient offers no specific complaints today.  REVIEW OF SYSTEMS:   Review of Systems  Constitutional: Negative.  Negative for fever, malaise/fatigue and weight loss.  Respiratory: Negative.  Negative for cough, hemoptysis and shortness of breath.   Cardiovascular: Negative.  Negative for chest pain and leg swelling.  Gastrointestinal: Negative.  Negative for abdominal pain, blood in stool and melena.  Genitourinary: Negative.  Negative for hematuria.  Musculoskeletal: Negative.  Negative for back pain.  Skin: Negative.  Negative for rash.  Neurological: Negative.  Negative for dizziness, focal weakness, weakness and headaches.  Psychiatric/Behavioral: Negative.  The patient is not nervous/anxious.     As per HPI. Otherwise, a complete review of systems is negative.  PAST MEDICAL HISTORY: Past Medical History:  Diagnosis Date  . A-fib ([place])   . Anemia   . Arthritis    ankles  . Cancer ([place])    multiple myeloma  . 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 Cardiomyopathy ([place])   . CHF (congestive heart failure)  ([place])   . Depression   . Dysrhythmia    atrial fib  . HLD (hyperlipidemia)   . Hypercholesteremia   . Hypertension   . Moderate tricuspid insufficiency   . Multiple myeloma ([place])    not being treated right now per pt 11/11/19  . Pneumonia   . Sleep apnea    No CPAP    PAST SURGICAL HISTORY: Past Surgical History:  Procedure Laterality Date  . ATRIAL ABLATION SURGERY    . CARDIAC SURGERY     A-Fib Ablations  . COLONOSCOPY WITH PROPOFOL N/A 10/26/2019   Procedure: COLONOSCOPY WITH BIOPSY;  Surgeon: Lucilla Lame, MD;  Location: [facility];  Service: Endoscopy;  Laterality: N/A;  . ESOPHAGOGASTRODUODENOSCOPY (EGD) WITH PROPOFOL N/A 10/26/2019   Procedure: ESOPHAGOGASTRODUODENOSCOPY (EGD) WITH BIOPSY;  Surgeon: Lucilla Lame, MD;  Location: [facility];  Service: Endoscopy;  Laterality: N/A;  sleep apnea  . LAPAROSCOPIC PARTIAL COLECTOMY Right 11/17/2019   Procedure: LAPAROSCOPIC PARTIAL COLECTOMY RIGHT EXTENDED;  Surgeon: Olean Ree, MD;  Location: [facility];  Service: General;  Laterality: Right;  . PORTACATH PLACEMENT N/A 12/28/2019   Procedure: INSERTION PORT-A-CATH Left subclavian;  Surgeon: Olean Ree, MD;  Location: [facility];  Service: General;  Laterality: N/A;    FAMILY HISTORY: Family History  Problem Relation Age of Onset  . Healthy Mother     ADVANCED DIRECTIVES (Y/N):  N  HEALTH MAINTENANCE: Social History   Tobacco Use  . Smoking status: Never Smoker  . Smokeless tobacco: Never Used  Substance Use Topics  . Alcohol use: Not Currently  . Drug use: Never  Colonoscopy:  PAP:  Bone density:  Lipid panel:  No Known Allergies  Current Outpatient Medications  Medication Sig Dispense Refill  . APPLE CIDER VINEGAR PO Take 30-45 mLs by mouth every other day.     Marland Kitchen aspirin 325 MG EC tablet Take 325 mg by mouth every other day. In the morning    . digoxin (DIGOX) 0.25 MG tablet Take 0.25 mg by mouth daily.     . furosemide (LASIX) 80 MG  tablet Take 80 mg by mouth 2 (two) times daily. Morning & late afternoon    . lidocaine-prilocaine (EMLA) cream Apply to affected area once 30 g 3  . Misc Natural Products (TART CHERRY ADVANCED PO) Take 60 mLs by mouth every other day.    . ondansetron (ZOFRAN) 8 MG tablet Take 1 tablet (8 mg total) by mouth 2 (two) times daily as needed for refractory nausea / vomiting. 60 tablet 2  . oxyCODONE (OXY IR/ROXICODONE) 5 MG immediate release tablet Take 1 tablet (5 mg total) by mouth every 4 (four) hours as needed for moderate pain or severe pain. 30 tablet 0  . potassium bicarbonate (KLOR-CON/EF) 25 MEQ disintegrating tablet Take 25 mEq by mouth 4 (four) times a week. In the afternoon.    . prochlorperazine (COMPAZINE) 10 MG tablet Take 1 tablet (10 mg total) by mouth every 6 (six) hours as needed (Nausea or vomiting). 60 tablet 2   No current facility-administered medications for this visit.   Facility-Administered Medications Ordered in Other Visits  Medication Dose Route Frequency Provider Last Rate Last Admin  . fluorouracil (ADRUCIL) 5,150 mg in sodium chloride 0.9 % 147 mL chemo infusion  2,400 mg/m2 (Treatment Plan Recorded) Intravenous 1 day or 1 dose Lloyd Huger, MD      . fluorouracil (ADRUCIL) chemo injection 850 mg  400 mg/m2 (Treatment Plan Recorded) Intravenous Once Lloyd Huger, MD      . leucovorin 850 mg in dextrose 5 % 250 mL infusion  850 mg Intravenous Once Lloyd Huger, MD      . oxaliplatin (ELOXATIN) 185 mg in dextrose 5 % 500 mL chemo infusion  85 mg/m2 (Treatment Plan Recorded) Intravenous Once Lloyd Huger, MD        OBJECTIVE: Vitals:   01/27/20 0857  BP: (!) 103/51  Pulse: (!) 47  Temp: (!) 96 F (35.6 C)     Body mass index is 31.01 kg/m.    ECOG FS:0 - Asymptomatic  General: Well-developed, well-nourished, no acute distress. Eyes: Pink conjunctiva, anicteric sclera. HEENT: Normocephalic, moist mucous membranes. Lungs: No audible  wheezing or coughing. Heart: Regular rate and rhythm. Abdomen: Soft, nontender, no obvious distention. Musculoskeletal: No edema, cyanosis, or clubbing. Neuro: Alert, answering all questions appropriately. Cranial nerves grossly intact. Skin: No rashes or petechiae noted. Psych: Normal affect.   LAB RESULTS:  Lab Results  Component Value Date   NA 138 01/27/2020   K 3.4 (L) 01/27/2020   CL 104 01/27/2020   CO2 26 01/27/2020   GLUCOSE 114 (H) 01/27/2020   BUN 16 01/27/2020   CREATININE 1.39 (H) 01/27/2020   CALCIUM 7.9 (L) 01/27/2020   PROT 7.0 01/27/2020   ALBUMIN 3.3 (L) 01/27/2020   AST 30 01/27/2020   ALT 15 01/27/2020   ALKPHOS 104 01/27/2020   BILITOT 0.6 01/27/2020   GFRNONAA 54 (L) 01/27/2020   GFRAA >60 01/27/2020    Lab Results  Component Value Date   WBC 2.5 (L) 01/27/2020   NEUTROABS  1.3 (L) 01/27/2020   HGB 10.3 (L) 01/27/2020   HCT 32.6 (L) 01/27/2020   MCV 86.0 01/27/2020   PLT 93 (L) 01/27/2020   Lab Results  Component Value Date   IRON 35 (L) 09/09/2019   TIBC 357 09/09/2019   IRONPCTSAT 10 (L) 09/09/2019   Lab Results  Component Value Date   FERRITIN 11 (L) 09/09/2019     STUDIES: DG Chest Port 1 View  Result Date: 12/28/2019 CLINICAL DATA:  Port-A-Cath placement. Multiple myeloma. Transverse colon cancer. EXAM: PORTABLE CHEST 1 VIEW COMPARISON:  CT chest 11/11/2019. FINDINGS: Trachea is midline. Heart is enlarged. Left-sided Port-A-Cath is in the SVC. No pneumothorax. Mild mixed interstitial and airspace opacification. No definite pleural fluid. IMPRESSION: 1. No pneumothorax after left-sided Port-A-Cath placement. 2. Suspect mild pulmonary edema. Electronically Signed   By: Lorin Picket M.D.   On: 12/28/2019 14:30   DG C-Arm 1-60 Min-No Report  Result Date: 12/28/2019 Fluoroscopy was utilized by the requesting physician.  No radiographic interpretation.    ASSESSMENT: Stage IIIc colon cancer, smoldering myeloma.  PLAN:    1.   Stage IIIc colon cancer: Imaging and final pathology reviewed independently confirming stage of disease.  Patient has agreed to adjuvant FOLFOX chemotherapy every 2 weeks for a total of 12 cycles. Patient has now had port placement.  Patient has mild pancytopenia, but will proceed with cycle 3 of FOLFOX today.  Return to clinic in 2 days for pump removal and then in 2 weeks for further evaluation and consideration of cycle 4.   2. Smoldering myeloma: Patient was initially diagnosed and treated in [place], [place] and treated with single agent Velcade in approximately 2013.  He was evaluated in clinic in July 2014 when he declined any maintenance treatment or referral for bone marrow transplant.  He was subsequently lost to follow-up.  His most recent SPEP revealed an M spike of 2.0 with an IgG predominance of 2949.  These are unchanged from 3 months prior.  He also has a suppressed IgA level.  Kappa/lambda light chain ratio has trended up slightly to 2.17.  He has mild renal insufficiency, but no other evidence of endorgan damage.  Metastatic bone survey on May 20, 2019 reviewed independently with no obvious bony lesions.  Bone marrow biopsy completed on May 28, 2019 revealed only 10 to 15% plasma cells with no clonality reported.  Cytogenetics were also reported as normal.  No intervention is needed at this time.  Continue to monitor closely. 3.  Iron deficiency anemia: Chronic and unchanged.  Patient's hemoglobin is 10.3 today.  He last received IV Feraheme on October 06, 2019.   4.  Renal insufficiency: Chronic and unchanged.  Patient's creatinine is 1.39. 5.  Peripheral neuropathy: Chronic and unchanged.  Patient does not complain of this today. 6. Leukopenia: Patient's white blood cell count has trended down slightly, proceed cautiously with treatment as above. 7. Thrombocytopenia: Platelet count is trending down as well.  Monitor. 8.  Hypokalemia: Improved.  Continue oral potassium  supplementation.  Patient expressed understanding and was in agreement with this plan. He also understands that He can call clinic at any time with any questions, concerns, or complaints.    Lloyd Huger, MD   01/27/2020 10:36 AM

## 2020-01-26 ENCOUNTER — Other Ambulatory Visit: Payer: Self-pay

## 2020-01-26 ENCOUNTER — Encounter: Payer: Self-pay | Admitting: Oncology

## 2020-01-26 NOTE — Progress Notes (Signed)
Patient pre screened for office appointment, no questions or concerns today. Patient reminded of upcoming appointment time and date. 

## 2020-01-27 ENCOUNTER — Inpatient Hospital Stay (HOSPITAL_BASED_OUTPATIENT_CLINIC_OR_DEPARTMENT_OTHER): Payer: BC Managed Care – PPO | Admitting: Oncology

## 2020-01-27 ENCOUNTER — Encounter (INDEPENDENT_AMBULATORY_CARE_PROVIDER_SITE_OTHER): Payer: Self-pay

## 2020-01-27 ENCOUNTER — Inpatient Hospital Stay: Payer: BC Managed Care – PPO | Attending: Oncology

## 2020-01-27 ENCOUNTER — Encounter: Payer: Self-pay | Admitting: Oncology

## 2020-01-27 ENCOUNTER — Inpatient Hospital Stay: Payer: BC Managed Care – PPO

## 2020-01-27 VITALS — HR 72

## 2020-01-27 VITALS — BP 103/51 | HR 47 | Temp 96.0°F | Wt 210.0 lb

## 2020-01-27 DIAGNOSIS — Z5111 Encounter for antineoplastic chemotherapy: Secondary | ICD-10-CM | POA: Insufficient documentation

## 2020-01-27 DIAGNOSIS — C184 Malignant neoplasm of transverse colon: Secondary | ICD-10-CM

## 2020-01-27 DIAGNOSIS — C189 Malignant neoplasm of colon, unspecified: Secondary | ICD-10-CM | POA: Diagnosis present

## 2020-01-27 LAB — CBC WITH DIFFERENTIAL/PLATELET
Abs Immature Granulocytes: 0.01 10*3/uL (ref 0.00–0.07)
Basophils Absolute: 0 10*3/uL (ref 0.0–0.1)
Basophils Relative: 0 %
Eosinophils Absolute: 0 10*3/uL (ref 0.0–0.5)
Eosinophils Relative: 2 %
HCT: 32.6 % — ABNORMAL LOW (ref 39.0–52.0)
Hemoglobin: 10.3 g/dL — ABNORMAL LOW (ref 13.0–17.0)
Immature Granulocytes: 0 %
Lymphocytes Relative: 24 %
Lymphs Abs: 0.6 10*3/uL — ABNORMAL LOW (ref 0.7–4.0)
MCH: 27.2 pg (ref 26.0–34.0)
MCHC: 31.6 g/dL (ref 30.0–36.0)
MCV: 86 fL (ref 80.0–100.0)
Monocytes Absolute: 0.6 10*3/uL (ref 0.1–1.0)
Monocytes Relative: 22 %
Neutro Abs: 1.3 10*3/uL — ABNORMAL LOW (ref 1.7–7.7)
Neutrophils Relative %: 52 %
Platelets: 93 10*3/uL — ABNORMAL LOW (ref 150–400)
RBC: 3.79 MIL/uL — ABNORMAL LOW (ref 4.22–5.81)
RDW: 13.8 % (ref 11.5–15.5)
WBC: 2.5 10*3/uL — ABNORMAL LOW (ref 4.0–10.5)
nRBC: 0 % (ref 0.0–0.2)

## 2020-01-27 LAB — COMPREHENSIVE METABOLIC PANEL
ALT: 15 U/L (ref 0–44)
AST: 30 U/L (ref 15–41)
Albumin: 3.3 g/dL — ABNORMAL LOW (ref 3.5–5.0)
Alkaline Phosphatase: 104 U/L (ref 38–126)
Anion gap: 8 (ref 5–15)
BUN: 16 mg/dL (ref 8–23)
CO2: 26 mmol/L (ref 22–32)
Calcium: 7.9 mg/dL — ABNORMAL LOW (ref 8.9–10.3)
Chloride: 104 mmol/L (ref 98–111)
Creatinine, Ser: 1.39 mg/dL — ABNORMAL HIGH (ref 0.61–1.24)
GFR calc Af Amer: >60 mL/min (ref >60)
GFR calc non Af Amer: 54 mL/min — ABNORMAL LOW (ref >60)
Glucose, Bld: 114 mg/dL — ABNORMAL HIGH (ref 70–99)
Potassium: 3.4 mmol/L — ABNORMAL LOW (ref 3.5–5.1)
Sodium: 138 mmol/L (ref 135–145)
Total Bilirubin: 0.6 mg/dL (ref 0.3–1.2)
Total Protein: 7 g/dL (ref 6.5–8.1)

## 2020-01-27 MED ORDER — PALONOSETRON HCL INJECTION 0.25 MG/5ML
0.2500 mg | Freq: Once | INTRAVENOUS | Status: AC
  Administered 2020-01-27: 0.25 mg via INTRAVENOUS
  Filled 2020-01-27: qty 5

## 2020-01-27 MED ORDER — FLUOROURACIL CHEMO INJECTION 2.5 GM/50ML
400.0000 mg/m2 | Freq: Once | INTRAVENOUS | Status: AC
  Administered 2020-01-27: 14:00:00 850 mg via INTRAVENOUS
  Filled 2020-01-27: qty 17

## 2020-01-27 MED ORDER — SODIUM CHLORIDE 0.9 % IV SOLN
10.0000 mg | Freq: Once | INTRAVENOUS | Status: AC
  Administered 2020-01-27: 10 mg via INTRAVENOUS
  Filled 2020-01-27: qty 10

## 2020-01-27 MED ORDER — SODIUM CHLORIDE 0.9% FLUSH
10.0000 mL | Freq: Once | INTRAVENOUS | Status: AC
  Administered 2020-01-27: 10 mL via INTRAVENOUS
  Filled 2020-01-27: qty 10

## 2020-01-27 MED ORDER — OXALIPLATIN CHEMO INJECTION 100 MG/20ML
85.0000 mg/m2 | Freq: Once | INTRAVENOUS | Status: AC
  Administered 2020-01-27: 11:00:00 185 mg via INTRAVENOUS
  Filled 2020-01-27: qty 37

## 2020-01-27 MED ORDER — DEXTROSE 5 % IV SOLN
Freq: Once | INTRAVENOUS | Status: AC
  Filled 2020-01-27: qty 250

## 2020-01-27 MED ORDER — LEUCOVORIN CALCIUM INJECTION 350 MG
850.0000 mg | Freq: Once | INTRAVENOUS | Status: AC
  Administered 2020-01-27: 850 mg via INTRAVENOUS
  Filled 2020-01-27: qty 42.5

## 2020-01-27 MED ORDER — SODIUM CHLORIDE 0.9 % IV SOLN
2400.0000 mg/m2 | INTRAVENOUS | Status: DC
  Administered 2020-01-27: 5150 mg via INTRAVENOUS
  Filled 2020-01-27: qty 100

## 2020-01-29 ENCOUNTER — Inpatient Hospital Stay: Payer: BC Managed Care – PPO

## 2020-01-29 VITALS — BP 121/73 | HR 60 | Temp 97.4°F | Resp 18

## 2020-01-29 DIAGNOSIS — C184 Malignant neoplasm of transverse colon: Secondary | ICD-10-CM

## 2020-01-29 DIAGNOSIS — Z5111 Encounter for antineoplastic chemotherapy: Secondary | ICD-10-CM | POA: Diagnosis not present

## 2020-01-29 MED ORDER — SODIUM CHLORIDE 0.9% FLUSH
10.0000 mL | INTRAVENOUS | Status: DC | PRN
  Administered 2020-01-29: 10 mL
  Filled 2020-01-29: qty 10

## 2020-01-29 MED ORDER — HEPARIN SOD (PORK) LOCK FLUSH 100 UNIT/ML IV SOLN
INTRAVENOUS | Status: AC
  Filled 2020-01-29: qty 5

## 2020-01-29 MED ORDER — HEPARIN SOD (PORK) LOCK FLUSH 100 UNIT/ML IV SOLN
500.0000 [IU] | Freq: Once | INTRAVENOUS | Status: AC | PRN
  Administered 2020-01-29: 500 [IU]
  Filled 2020-01-29: qty 5

## 2020-01-29 MED ORDER — HEPARIN SOD (PORK) LOCK FLUSH 100 UNIT/ML IV SOLN
250.0000 [IU] | Freq: Once | INTRAVENOUS | Status: DC | PRN
  Filled 2020-01-29: qty 5

## 2020-02-02 NOTE — Progress Notes (Signed)
Pharmacist Chemotherapy Monitoring - Follow Up Assessment    I verify that I have reviewed each item in the below checklist:  . Regimen for the patient is scheduled for the appropriate day and plan matches scheduled date. Marland Kitchen Appropriate non-routine labs are ordered dependent on drug ordered. . If applicable, additional medications reviewed and ordered per protocol based on lifetime cumulative doses and/or treatment regimen.   Plan for follow-up and/or issues identified: No . I-vent associated with next due treatment: No . MD and/or nursing notified: No  Eleftheria Taborn K 02/02/2020 1:38 PM

## 2020-02-04 NOTE — Progress Notes (Signed)
[facility]  Telephone:(336) [phone] Fax:(336) [phone]  ID: [facility] OB: February 25, 1956  MR#: [id number]  VGJ#:[id number]  Patient Care Team: Sofie Hartigan, MD as PCP - General (Family Medicine) Lloyd Huger, MD as Consulting Physician (Oncology)  CHIEF COMPLAINT: Stage IIIc colon cancer, smoldering myeloma.  INTERVAL HISTORY: Patient returns to clinic today for further evaluation and consideration of cycle 4 of 12 of adjuvant FOLFOX.  He continues to feel well and remains asymptomatic.  He is tolerating his treatments without significant side effects.  He continues to remain active and playing tennis throughout the week.  He has no neurologic complaints. He does not complain of weakness or fatigue today.  He has a good appetite and denies weight loss.  He has no chest pain, shortness of breath, cough, or hemoptysis.  He denies any nausea, vomiting, constipation, or diarrhea.  He has no melena or hematochezia.  He has no urinary complaints.  Patient offers no specific complaints today.  REVIEW OF SYSTEMS:   Review of Systems  Constitutional: Negative.  Negative for fever, malaise/fatigue and weight loss.  Respiratory: Negative.  Negative for cough, hemoptysis and shortness of breath.   Cardiovascular: Negative.  Negative for chest pain and leg swelling.  Gastrointestinal: Negative.  Negative for abdominal pain, blood in stool and melena.  Genitourinary: Negative.  Negative for hematuria.  Musculoskeletal: Negative.  Negative for back pain.  Skin: Negative.  Negative for rash.  Neurological: Negative.  Negative for dizziness, focal weakness, weakness and headaches.  Psychiatric/Behavioral: Negative.  The patient is not nervous/anxious.     As per HPI. Otherwise, a complete review of systems is negative.  PAST MEDICAL HISTORY: Past Medical History:  Diagnosis Date  . A-fib ([place])   . Anemia   . Arthritis    ankles  . Cancer ([place])    multiple myeloma   . 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 Cardiomyopathy ([place])   . CHF (congestive heart failure) ([place])   . Depression   . Dysrhythmia    atrial fib  . HLD (hyperlipidemia)   . Hypercholesteremia   . Hypertension   . Moderate tricuspid insufficiency   . Multiple myeloma ([place])    not being treated right now per pt 11/11/19  . Pneumonia   . Sleep apnea    No CPAP    PAST SURGICAL HISTORY: Past Surgical History:  Procedure Laterality Date  . ATRIAL ABLATION SURGERY    . CARDIAC SURGERY     A-Fib Ablations  . COLONOSCOPY WITH PROPOFOL N/A 10/26/2019   Procedure: COLONOSCOPY WITH BIOPSY;  Surgeon: Lucilla Lame, MD;  Location: [facility];  Service: Endoscopy;  Laterality: N/A;  . ESOPHAGOGASTRODUODENOSCOPY (EGD) WITH PROPOFOL N/A 10/26/2019   Procedure: ESOPHAGOGASTRODUODENOSCOPY (EGD) WITH BIOPSY;  Surgeon: Lucilla Lame, MD;  Location: [facility];  Service: Endoscopy;  Laterality: N/A;  sleep apnea  . LAPAROSCOPIC PARTIAL COLECTOMY Right 11/17/2019   Procedure: LAPAROSCOPIC PARTIAL COLECTOMY RIGHT EXTENDED;  Surgeon: Olean Ree, MD;  Location: [facility];  Service: General;  Laterality: Right;  . PORTACATH PLACEMENT N/A 12/28/2019   Procedure: INSERTION PORT-A-CATH Left subclavian;  Surgeon: Olean Ree, MD;  Location: [facility];  Service: General;  Laterality: N/A;    FAMILY HISTORY: Family History  Problem Relation Age of Onset  . Healthy Mother     ADVANCED DIRECTIVES (Y/N):  N  HEALTH MAINTENANCE: Social History   Tobacco Use  . Smoking status: Never Smoker  . Smokeless tobacco: Never Used  Substance Use Topics  . Alcohol use: Not Currently  . Drug use: Never     Colonoscopy:  PAP:  Bone density:  Lipid panel:  No Known Allergies  Current Outpatient Medications  Medication Sig Dispense Refill  . APPLE CIDER VINEGAR PO Take 30-45 mLs by mouth every other day.     Marland Kitchen aspirin 325 MG EC tablet Take 325 mg by mouth every other day. In  the morning    . digoxin (DIGOX) 0.25 MG tablet Take 0.25 mg by mouth daily.     . furosemide (LASIX) 80 MG tablet Take 80 mg by mouth 2 (two) times daily. Morning & late afternoon    . lidocaine-prilocaine (EMLA) cream Apply to affected area once 30 g 3  . Misc Natural Products (TART CHERRY ADVANCED PO) Take 60 mLs by mouth every other day.    . ondansetron (ZOFRAN) 8 MG tablet Take 1 tablet (8 mg total) by mouth 2 (two) times daily as needed for refractory nausea / vomiting. 60 tablet 2  . oxyCODONE (OXY IR/ROXICODONE) 5 MG immediate release tablet Take 1 tablet (5 mg total) by mouth every 4 (four) hours as needed for moderate pain or severe pain. 30 tablet 0  . potassium bicarbonate (KLOR-CON/EF) 25 MEQ disintegrating tablet Take 25 mEq by mouth 4 (four) times a week. In the afternoon.    . prochlorperazine (COMPAZINE) 10 MG tablet Take 1 tablet (10 mg total) by mouth every 6 (six) hours as needed (Nausea or vomiting). 60 tablet 2   No current facility-administered medications for this visit.    OBJECTIVE: Vitals:   02/10/20 0904  BP: 130/90  Pulse: 74  Resp: 18  Temp: (!) 96 F (35.6 C)  SpO2: 100%     Body mass index is 30.82 kg/m.    ECOG FS:0 - Asymptomatic  General: Well-developed, well-nourished, no acute distress. Eyes: Pink conjunctiva, anicteric sclera. HEENT: Normocephalic, moist mucous membranes. Lungs: No audible wheezing or coughing. Heart: Regular rate and rhythm. Abdomen: Soft, nontender, no obvious distention. Musculoskeletal: No edema, cyanosis, or clubbing. Neuro: Alert, answering all questions appropriately. Cranial nerves grossly intact. Skin: No rashes or petechiae noted. Psych: Normal affect.  LAB RESULTS:  Lab Results  Component Value Date   NA 141 02/10/2020   K 3.6 02/10/2020   CL 107 02/10/2020   CO2 26 02/10/2020   GLUCOSE 111 (H) 02/10/2020   BUN 15 02/10/2020   CREATININE 1.52 (H) 02/10/2020   CALCIUM 8.3 (L) 02/10/2020   PROT 7.2  02/10/2020   ALBUMIN 3.2 (L) 02/10/2020   AST 33 02/10/2020   ALT 18 02/10/2020   ALKPHOS 102 02/10/2020   BILITOT 0.9 02/10/2020   GFRNONAA 48 (L) 02/10/2020   GFRAA 55 (L) 02/10/2020    Lab Results  Component Value Date   WBC 1.9 (L) 02/10/2020   NEUTROABS 0.9 (L) 02/10/2020   HGB 9.8 (L) 02/10/2020   HCT 31.2 (L) 02/10/2020   MCV 86.9 02/10/2020   PLT 87 (L) 02/10/2020   Lab Results  Component Value Date   IRON 35 (L) 09/09/2019   TIBC 357 09/09/2019   IRONPCTSAT 10 (L) 09/09/2019   Lab Results  Component Value Date   FERRITIN 11 (L) 09/09/2019     STUDIES: No results found.  ASSESSMENT: Stage IIIc colon cancer, smoldering myeloma.  PLAN:    1.  Stage IIIc colon cancer: Imaging and final pathology reviewed independently confirming stage of disease.  Patient has agreed to adjuvant FOLFOX chemotherapy every  2 weeks for a total of 12 cycles. Patient has now had port placement.  Patient's pancytopenia has progressed slightly, therefore will delay cycle 4 of FOLFOX today.  Return to clinic in 1 week for reconsideration of treatment.  2. Smoldering myeloma: Patient was initially diagnosed and treated in [place], [place] and treated with single agent Velcade in approximately 2013.  He was evaluated in clinic in July 2014 when he declined any maintenance treatment or referral for bone marrow transplant.  He was subsequently lost to follow-up.  His most recent SPEP revealed an M spike of 2.0 with an IgG predominance of 2949.  These are unchanged from 3 months prior.  He also has a suppressed IgA level.  Kappa/lambda light chain ratio has trended up slightly to 2.17.  He has mild renal insufficiency, but no other evidence of endorgan damage.  Metastatic bone survey on May 20, 2019 reviewed independently with no obvious bony lesions.  Bone marrow biopsy completed on May 28, 2019 revealed only 10 to 15% plasma cells with no clonality reported.  Cytogenetics were also reported as  normal.  No intervention is needed at this time.  Continue to monitor closely. 3.  Iron deficiency anemia: Chronic and unchanged.  Patient's hemoglobin is 9.8 today.  He last received IV Feraheme on October 06, 2019.   4.  Renal insufficiency: Slightly worse today.  Patient's creatinine is 1.52. 5.  Peripheral neuropathy: Chronic and unchanged.  Patient does not complain of this today. 6. Leukopenia: Patient's total white blood cell count has trended down to 1.9 and he is now neutropenic with an ANC of 0.9.  Delay treatment as above.  Can consider adding Udenyca with future treatments. 7. Thrombocytopenia: Platelet count is trending down as well.  Delay treatment as above. 8.  Hypokalemia: Improved.  Resolved.  Continue oral potassium supplementation.  Patient expressed understanding and was in agreement with this plan. He also understands that He can call clinic at any time with any questions, concerns, or complaints.    Lloyd Huger, MD   02/10/2020 1:47 PM

## 2020-02-09 ENCOUNTER — Encounter: Payer: Self-pay | Admitting: Oncology

## 2020-02-10 ENCOUNTER — Inpatient Hospital Stay: Payer: BC Managed Care – PPO

## 2020-02-10 ENCOUNTER — Inpatient Hospital Stay (HOSPITAL_BASED_OUTPATIENT_CLINIC_OR_DEPARTMENT_OTHER): Payer: BC Managed Care – PPO | Admitting: Oncology

## 2020-02-10 ENCOUNTER — Inpatient Hospital Stay: Payer: BC Managed Care – PPO | Attending: Oncology

## 2020-02-10 ENCOUNTER — Other Ambulatory Visit: Payer: Self-pay

## 2020-02-10 VITALS — BP 130/90 | HR 74 | Temp 96.0°F | Resp 18 | Wt 208.7 lb

## 2020-02-10 DIAGNOSIS — C184 Malignant neoplasm of transverse colon: Secondary | ICD-10-CM | POA: Diagnosis not present

## 2020-02-10 DIAGNOSIS — D709 Neutropenia, unspecified: Secondary | ICD-10-CM | POA: Diagnosis not present

## 2020-02-10 DIAGNOSIS — D509 Iron deficiency anemia, unspecified: Secondary | ICD-10-CM | POA: Diagnosis not present

## 2020-02-10 DIAGNOSIS — N289 Disorder of kidney and ureter, unspecified: Secondary | ICD-10-CM | POA: Diagnosis not present

## 2020-02-10 DIAGNOSIS — C9000 Multiple myeloma not having achieved remission: Secondary | ICD-10-CM | POA: Insufficient documentation

## 2020-02-10 DIAGNOSIS — Z5111 Encounter for antineoplastic chemotherapy: Secondary | ICD-10-CM | POA: Insufficient documentation

## 2020-02-10 DIAGNOSIS — Z5189 Encounter for other specified aftercare: Secondary | ICD-10-CM | POA: Diagnosis not present

## 2020-02-10 DIAGNOSIS — Z95828 Presence of other vascular implants and grafts: Secondary | ICD-10-CM

## 2020-02-10 DIAGNOSIS — D696 Thrombocytopenia, unspecified: Secondary | ICD-10-CM | POA: Diagnosis not present

## 2020-02-10 LAB — CBC WITH DIFFERENTIAL/PLATELET
Abs Immature Granulocytes: 0 10*3/uL (ref 0.00–0.07)
Basophils Absolute: 0 10*3/uL (ref 0.0–0.1)
Basophils Relative: 1 %
Eosinophils Absolute: 0 10*3/uL (ref 0.0–0.5)
Eosinophils Relative: 1 %
HCT: 31.2 % — ABNORMAL LOW (ref 39.0–52.0)
Hemoglobin: 9.8 g/dL — ABNORMAL LOW (ref 13.0–17.0)
Immature Granulocytes: 0 %
Lymphocytes Relative: 25 %
Lymphs Abs: 0.5 10*3/uL — ABNORMAL LOW (ref 0.7–4.0)
MCH: 27.3 pg (ref 26.0–34.0)
MCHC: 31.4 g/dL (ref 30.0–36.0)
MCV: 86.9 fL (ref 80.0–100.0)
Monocytes Absolute: 0.5 10*3/uL (ref 0.1–1.0)
Monocytes Relative: 27 %
Neutro Abs: 0.9 10*3/uL — ABNORMAL LOW (ref 1.7–7.7)
Neutrophils Relative %: 46 %
Platelets: 87 10*3/uL — ABNORMAL LOW (ref 150–400)
RBC: 3.59 MIL/uL — ABNORMAL LOW (ref 4.22–5.81)
RDW: 14.8 % (ref 11.5–15.5)
WBC: 1.9 10*3/uL — ABNORMAL LOW (ref 4.0–10.5)
nRBC: 0 % (ref 0.0–0.2)

## 2020-02-10 LAB — COMPREHENSIVE METABOLIC PANEL
ALT: 18 U/L (ref 0–44)
AST: 33 U/L (ref 15–41)
Albumin: 3.2 g/dL — ABNORMAL LOW (ref 3.5–5.0)
Alkaline Phosphatase: 102 U/L (ref 38–126)
Anion gap: 8 (ref 5–15)
BUN: 15 mg/dL (ref 8–23)
CO2: 26 mmol/L (ref 22–32)
Calcium: 8.3 mg/dL — ABNORMAL LOW (ref 8.9–10.3)
Chloride: 107 mmol/L (ref 98–111)
Creatinine, Ser: 1.52 mg/dL — ABNORMAL HIGH (ref 0.61–1.24)
GFR calc Af Amer: 55 mL/min — ABNORMAL LOW (ref >60)
GFR calc non Af Amer: 48 mL/min — ABNORMAL LOW (ref >60)
Glucose, Bld: 111 mg/dL — ABNORMAL HIGH (ref 70–99)
Potassium: 3.6 mmol/L (ref 3.5–5.1)
Sodium: 141 mmol/L (ref 135–145)
Total Bilirubin: 0.9 mg/dL (ref 0.3–1.2)
Total Protein: 7.2 g/dL (ref 6.5–8.1)

## 2020-02-10 MED ORDER — SODIUM CHLORIDE 0.9% FLUSH
10.0000 mL | Freq: Once | INTRAVENOUS | Status: AC
  Administered 2020-02-10: 10 mL via INTRAVENOUS
  Filled 2020-02-10: qty 10

## 2020-02-10 MED ORDER — HEPARIN SOD (PORK) LOCK FLUSH 100 UNIT/ML IV SOLN
500.0000 [IU] | Freq: Once | INTRAVENOUS | Status: AC
  Administered 2020-02-10: 500 [IU] via INTRAVENOUS
  Filled 2020-02-10: qty 5

## 2020-02-12 ENCOUNTER — Inpatient Hospital Stay: Payer: BC Managed Care – PPO

## 2020-02-12 NOTE — Progress Notes (Signed)
[place]  [phone] [phone]  ID: Chris George OB: 03-14-1956  MR#: [id number]  QIO#:[id number]  Patient Care Team: Sofie Hartigan, MD as PCP - General (Family Medicine) Lloyd Huger, MD as Consulting Physician (Oncology)  CHIEF COMPLAINT: Stage IIIc colon cancer, smoldering myeloma.  INTERVAL HISTORY: Patient returns to clinic today for further evaluation and reconsideration of cycle 4 of 12 of adjuvant FOLFOX.  He continues to feel well and remains asymptomatic.  He is tolerating his treatments without significant side effects. He continues to remain active and playing tennis throughout the week.  He has no neurologic complaints. He does not complain of weakness or fatigue today.  He has a good appetite and denies weight loss.  He has no chest pain, shortness of breath, cough, or hemoptysis.  He denies any nausea, vomiting, constipation, or diarrhea.  He has no melena or hematochezia.  He has no urinary complaints.  Patient offers no specific complaints today.  REVIEW OF SYSTEMS:   Review of Systems  Constitutional: Negative.  Negative for fever, malaise/fatigue and weight loss.  Respiratory: Negative.  Negative for cough, hemoptysis and shortness of breath.   Cardiovascular: Negative.  Negative for chest pain and leg swelling.  Gastrointestinal: Negative.  Negative for abdominal pain, blood in stool and melena.  Genitourinary: Negative.  Negative for hematuria.  Musculoskeletal: Negative.  Negative for back pain.  Skin: Negative.  Negative for rash.  Neurological: Negative.  Negative for dizziness, focal weakness, weakness and headaches.  Psychiatric/Behavioral: Negative.  The patient is not nervous/anxious.     As per HPI. Otherwise, a complete review of systems is negative.  PAST MEDICAL HISTORY: Past Medical History:  Diagnosis Date  . A-fib ([place])   . Anemia   . Arthritis    ankles  . Cancer ([place])    multiple myeloma   . 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[place]) 10/26/2019  . Cardiomyopathy ([place])   . CHF (congestive heart failure) ([place])   . Depression   . Dysrhythmia    atrial fib  . HLD (hyperlipidemia)   . Hypercholesteremia   . Hypertension   . Moderate tricuspid insufficiency   . Multiple myeloma ([place])    not being treated right now per pt 11/11/19  . Pneumonia   . Sleep apnea    No CPAP    PAST SURGICAL HISTORY: Past Surgical History:  Procedure Laterality Date  . ATRIAL ABLATION SURGERY    . CARDIAC SURGERY     A-Fib Ablations  . COLONOSCOPY WITH PROPOFOL N/A 10/26/2019   Procedure: COLONOSCOPY WITH BIOPSY;  Surgeon: Lucilla Lame, MD;  Location: [facility];  Service: Endoscopy;  Laterality: N/A;  . ESOPHAGOGASTRODUODENOSCOPY (EGD) WITH PROPOFOL N/A 10/26/2019   Procedure: ESOPHAGOGASTRODUODENOSCOPY (EGD) WITH BIOPSY;  Surgeon: Lucilla Lame, MD;  Location: [facility];  Service: Endoscopy;  Laterality: N/A;  sleep apnea  . LAPAROSCOPIC PARTIAL COLECTOMY Right 11/17/2019   Procedure: LAPAROSCOPIC PARTIAL COLECTOMY RIGHT EXTENDED;  Surgeon: Olean Ree, MD;  Location: [facility];  Service: General;  Laterality: Right;  . PORTACATH PLACEMENT N/A 12/28/2019   Procedure: INSERTION PORT-A-CATH Left subclavian;  Surgeon: Olean Ree, MD;  Location: [facility];  Service: General;  Laterality: N/A;    FAMILY HISTORY: Family History  Problem Relation Age of Onset  . Healthy Mother     ADVANCED DIRECTIVES (Y/N):  N  HEALTH MAINTENANCE: Social History   Tobacco Use  . Smoking status: Never Smoker  . Smokeless tobacco: Never Used  Substance Use Topics  . Alcohol use: Not Currently  . Drug use: Never     Colonoscopy:  PAP:  Bone density:  Lipid panel:  No Known Allergies  Current Outpatient Medications  Medication Sig Dispense Refill  . APPLE CIDER VINEGAR PO Take 30-45 mLs by mouth every other day.     Marland Kitchen aspirin 325 MG EC tablet Take 325 mg by mouth every other day. In  the morning    . digoxin (DIGOX) 0.25 MG tablet Take 0.25 mg by mouth daily.     . furosemide (LASIX) 80 MG tablet Take 80 mg by mouth 2 (two) times daily. Morning & late afternoon    . lidocaine-prilocaine (EMLA) cream Apply to affected area once 30 g 3  . Misc Natural Products (TART CHERRY ADVANCED PO) Take 60 mLs by mouth every other day.    . ondansetron (ZOFRAN) 8 MG tablet Take 1 tablet (8 mg total) by mouth 2 (two) times daily as needed for refractory nausea / vomiting. 60 tablet 2  . oxyCODONE (OXY IR/ROXICODONE) 5 MG immediate release tablet Take 1 tablet (5 mg total) by mouth every 4 (four) hours as needed for moderate pain or severe pain. 30 tablet 0  . potassium bicarbonate (KLOR-CON/EF) 25 MEQ disintegrating tablet Take 25 mEq by mouth 4 (four) times a week. In the afternoon.    . prochlorperazine (COMPAZINE) 10 MG tablet Take 1 tablet (10 mg total) by mouth every 6 (six) hours as needed (Nausea or vomiting). 60 tablet 2   No current facility-administered medications for this visit.    OBJECTIVE: Vitals:   02/17/20 0906  BP: 114/65  Pulse: 65  Resp: 18  Temp: (!) 95.9 F (35.5 C)  SpO2: 100%     Body mass index is 30.44 kg/m.    ECOG FS:0 - Asymptomatic  General: Well-developed, well-nourished, no acute distress. Eyes: Pink conjunctiva, anicteric sclera. HEENT: Normocephalic, moist mucous membranes. Lungs: No audible wheezing or coughing. Heart: Regular rate and rhythm. Abdomen: Soft, nontender, no obvious distention. Musculoskeletal: No edema, cyanosis, or clubbing. Neuro: Alert, answering all questions appropriately. Cranial nerves grossly intact. Skin: No rashes or petechiae noted. Psych: Normal affect.    LAB RESULTS:  Lab Results  Component Value Date   NA 139 02/17/2020   K 4.0 02/17/2020   CL 100 02/17/2020   CO2 31 02/17/2020   GLUCOSE 109 (H) 02/17/2020   BUN 13 02/17/2020   CREATININE 1.51 (H) 02/17/2020   CALCIUM 8.4 (L) 02/17/2020   PROT 7.6  02/17/2020   ALBUMIN 3.4 (L) 02/17/2020   AST 29 02/17/2020   ALT 16 02/17/2020   ALKPHOS 106 02/17/2020   BILITOT 0.9 02/17/2020   GFRNONAA 48 (L) 02/17/2020   GFRAA 56 (L) 02/17/2020    Lab Results  Component Value Date   WBC 2.3 (L) 02/17/2020   NEUTROABS 0.8 (L) 02/17/2020   HGB 11.1 (L) 02/17/2020   HCT 35.0 (L) 02/17/2020   MCV 88.6 02/17/2020   PLT 158 02/17/2020   Lab Results  Component Value Date   IRON 35 (L) 09/09/2019   TIBC 357 09/09/2019   IRONPCTSAT 10 (L) 09/09/2019   Lab Results  Component Value Date   FERRITIN 11 (L) 09/09/2019     STUDIES: No results found.  ASSESSMENT: Stage IIIc colon cancer, smoldering myeloma.  PLAN:    1.  Stage IIIc colon cancer: Imaging and final pathology reviewed independently confirming stage of disease.  Patient has agreed to adjuvant FOLFOX chemotherapy  every 2 weeks for a total of 12 cycles. Patient has now had port placement.  Patient's pancytopenia has improved, therefore will proceed with cycle 4 of FOLFOX today.  Given his persistent neutropenia, will add Udenyca to the remainder of his treatments.  Return to clinic in 2 days for pump removal and Udenyca and then in 2 weeks for further evaluation and consideration of cycle 5.   2. Smoldering myeloma: Patient was initially diagnosed and treated in [place], [place] and treated with single agent Velcade in approximately 2013.  He was evaluated in clinic in July 2014 when he declined any maintenance treatment or referral for bone marrow transplant.  He was subsequently lost to follow-up.  His most recent SPEP revealed an M spike of 2.0 with an IgG predominance of 2949.  These are unchanged from 3 months prior.  He also has a suppressed IgA level.  Kappa/lambda light chain ratio has trended up slightly to 2.17.  He has mild renal insufficiency, but no other evidence of endorgan damage.  Metastatic bone survey on May 20, 2019 reviewed independently with no obvious bony lesions.   Bone marrow biopsy completed on May 28, 2019 revealed only 10 to 15% plasma cells with no clonality reported.  Cytogenetics were also reported as normal.  No intervention is needed at this time.  Continue to monitor closely. 3.  Iron deficiency anemia: Hemoglobin has improved to 11.1.  He last received IV Feraheme on October 06, 2019.   4.  Renal insufficiency: Chronic and unchanged.  Patient's creatinine is 1.51. 5.  Peripheral neuropathy: Chronic and unchanged.  Patient does not complain of this today. 6.  Neutropenia: Improved.  Proceed with treatment today and add Udenyca with pump removal as above. 7. Thrombocytopenia: Resolved. 8.  Hypokalemia: Improved.  Resolved.  Continue oral potassium supplementation.  Patient expressed understanding and was in agreement with this plan. He also understands that He can call clinic at any time with any questions, concerns, or complaints.    Lloyd Huger, MD   02/18/2020 6:35 AM

## 2020-02-17 ENCOUNTER — Inpatient Hospital Stay: Payer: BC Managed Care – PPO

## 2020-02-17 ENCOUNTER — Encounter: Payer: Self-pay | Admitting: Oncology

## 2020-02-17 ENCOUNTER — Other Ambulatory Visit: Payer: Self-pay

## 2020-02-17 ENCOUNTER — Inpatient Hospital Stay (HOSPITAL_BASED_OUTPATIENT_CLINIC_OR_DEPARTMENT_OTHER): Payer: BC Managed Care – PPO | Admitting: Oncology

## 2020-02-17 VITALS — BP 114/65 | HR 65 | Temp 95.9°F | Resp 18 | Wt 206.1 lb

## 2020-02-17 DIAGNOSIS — C184 Malignant neoplasm of transverse colon: Secondary | ICD-10-CM | POA: Diagnosis not present

## 2020-02-17 DIAGNOSIS — Z5111 Encounter for antineoplastic chemotherapy: Secondary | ICD-10-CM | POA: Diagnosis not present

## 2020-02-17 LAB — CBC WITH DIFFERENTIAL/PLATELET
Abs Immature Granulocytes: 0 10*3/uL (ref 0.00–0.07)
Basophils Absolute: 0 10*3/uL (ref 0.0–0.1)
Basophils Relative: 0 %
Eosinophils Absolute: 0 10*3/uL (ref 0.0–0.5)
Eosinophils Relative: 1 %
HCT: 35 % — ABNORMAL LOW (ref 39.0–52.0)
Hemoglobin: 11.1 g/dL — ABNORMAL LOW (ref 13.0–17.0)
Immature Granulocytes: 0 %
Lymphocytes Relative: 37 %
Lymphs Abs: 0.9 10*3/uL (ref 0.7–4.0)
MCH: 28.1 pg (ref 26.0–34.0)
MCHC: 31.7 g/dL (ref 30.0–36.0)
MCV: 88.6 fL (ref 80.0–100.0)
Monocytes Absolute: 0.7 10*3/uL (ref 0.1–1.0)
Monocytes Relative: 29 %
Neutro Abs: 0.8 10*3/uL — ABNORMAL LOW (ref 1.7–7.7)
Neutrophils Relative %: 33 %
Platelets: 158 10*3/uL (ref 150–400)
RBC: 3.95 MIL/uL — ABNORMAL LOW (ref 4.22–5.81)
RDW: 16.1 % — ABNORMAL HIGH (ref 11.5–15.5)
WBC: 2.3 10*3/uL — ABNORMAL LOW (ref 4.0–10.5)
nRBC: 0 % (ref 0.0–0.2)

## 2020-02-17 LAB — COMPREHENSIVE METABOLIC PANEL
ALT: 16 U/L (ref 0–44)
AST: 29 U/L (ref 15–41)
Albumin: 3.4 g/dL — ABNORMAL LOW (ref 3.5–5.0)
Alkaline Phosphatase: 106 U/L (ref 38–126)
Anion gap: 8 (ref 5–15)
BUN: 13 mg/dL (ref 8–23)
CO2: 31 mmol/L (ref 22–32)
Calcium: 8.4 mg/dL — ABNORMAL LOW (ref 8.9–10.3)
Chloride: 100 mmol/L (ref 98–111)
Creatinine, Ser: 1.51 mg/dL — ABNORMAL HIGH (ref 0.61–1.24)
GFR calc Af Amer: 56 mL/min — ABNORMAL LOW (ref >60)
GFR calc non Af Amer: 48 mL/min — ABNORMAL LOW (ref >60)
Glucose, Bld: 109 mg/dL — ABNORMAL HIGH (ref 70–99)
Potassium: 4 mmol/L (ref 3.5–5.1)
Sodium: 139 mmol/L (ref 135–145)
Total Bilirubin: 0.9 mg/dL (ref 0.3–1.2)
Total Protein: 7.6 g/dL (ref 6.5–8.1)

## 2020-02-17 MED ORDER — LEUCOVORIN CALCIUM INJECTION 350 MG
850.0000 mg | Freq: Once | INTRAVENOUS | Status: AC
  Administered 2020-02-17: 850 mg via INTRAVENOUS
  Filled 2020-02-17: qty 17.5

## 2020-02-17 MED ORDER — OXALIPLATIN CHEMO INJECTION 100 MG/20ML
85.0000 mg/m2 | Freq: Once | INTRAVENOUS | Status: AC
  Administered 2020-02-17: 185 mg via INTRAVENOUS
  Filled 2020-02-17: qty 37

## 2020-02-17 MED ORDER — SODIUM CHLORIDE 0.9 % IV SOLN
2400.0000 mg/m2 | INTRAVENOUS | Status: DC
  Administered 2020-02-17: 5150 mg via INTRAVENOUS
  Filled 2020-02-17: qty 103

## 2020-02-17 MED ORDER — SODIUM CHLORIDE 0.9% FLUSH
10.0000 mL | Freq: Once | INTRAVENOUS | Status: AC
  Administered 2020-02-17: 10 mL via INTRAVENOUS
  Filled 2020-02-17: qty 10

## 2020-02-17 MED ORDER — PALONOSETRON HCL INJECTION 0.25 MG/5ML
0.2500 mg | Freq: Once | INTRAVENOUS | Status: AC
  Administered 2020-02-17: 0.25 mg via INTRAVENOUS
  Filled 2020-02-17: qty 5

## 2020-02-17 MED ORDER — DEXTROSE 5 % IV SOLN
Freq: Once | INTRAVENOUS | Status: AC
  Filled 2020-02-17: qty 250

## 2020-02-17 MED ORDER — FLUOROURACIL CHEMO INJECTION 2.5 GM/50ML
400.0000 mg/m2 | Freq: Once | INTRAVENOUS | Status: AC
  Administered 2020-02-17: 850 mg via INTRAVENOUS
  Filled 2020-02-17: qty 17

## 2020-02-17 MED ORDER — SODIUM CHLORIDE 0.9 % IV SOLN
10.0000 mg | Freq: Once | INTRAVENOUS | Status: AC
  Administered 2020-02-17: 10 mg via INTRAVENOUS
  Filled 2020-02-17: qty 10

## 2020-02-17 NOTE — Progress Notes (Signed)
Per Dr Woodfin Ganja okay to proceed with pt.'s ANC 0.8. Treatment team updated.  Chris George [employer]

## 2020-02-17 NOTE — Progress Notes (Signed)
Patient denies pain or concerns today at his follow up.

## 2020-02-19 ENCOUNTER — Inpatient Hospital Stay: Payer: BC Managed Care – PPO

## 2020-02-19 ENCOUNTER — Other Ambulatory Visit: Payer: Self-pay

## 2020-02-19 ENCOUNTER — Encounter: Payer: Self-pay | Admitting: *Deleted

## 2020-02-19 VITALS — BP 126/82 | HR 51 | Temp 96.7°F | Resp 18

## 2020-02-19 DIAGNOSIS — Z5111 Encounter for antineoplastic chemotherapy: Secondary | ICD-10-CM | POA: Diagnosis not present

## 2020-02-19 DIAGNOSIS — C184 Malignant neoplasm of transverse colon: Secondary | ICD-10-CM

## 2020-02-19 MED ORDER — SODIUM CHLORIDE 0.9% FLUSH
10.0000 mL | INTRAVENOUS | Status: DC | PRN
  Administered 2020-02-19: 10 mL
  Filled 2020-02-19: qty 10

## 2020-02-19 MED ORDER — HEPARIN SOD (PORK) LOCK FLUSH 100 UNIT/ML IV SOLN
500.0000 [IU] | Freq: Once | INTRAVENOUS | Status: AC | PRN
  Administered 2020-02-19: 500 [IU]
  Filled 2020-02-19: qty 5

## 2020-02-19 MED ORDER — HEPARIN SOD (PORK) LOCK FLUSH 100 UNIT/ML IV SOLN
INTRAVENOUS | Status: AC
  Filled 2020-02-19: qty 5

## 2020-02-19 MED ORDER — PEGFILGRASTIM-CBQV 6 MG/0.6ML ~~LOC~~ SOSY
6.0000 mg | PREFILLED_SYRINGE | Freq: Once | SUBCUTANEOUS | Status: AC
  Administered 2020-02-19: 15:00:00 6 mg via SUBCUTANEOUS
  Filled 2020-02-19: qty 0.6

## 2020-02-24 NOTE — Progress Notes (Signed)

## 2020-02-27 NOTE — Progress Notes (Signed)
[place]  Telephone:(336) [phone] [phone]) [phone]  ID: Chris George OB: 1956-10-22  MR#: [id number]  EGB#:[id number]  Patient Care Team: Sofie Hartigan, MD as PCP - General (Family Medicine) Lloyd Huger, MD as Consulting Physician (Oncology)  CHIEF COMPLAINT: Stage IIIc colon cancer, smoldering myeloma.  INTERVAL HISTORY: Patient returns to clinic today for further evaluation and consideration of cycle 5 of 12 of adjuvant FOLFOX.  He is tolerating his treatment well without significant side effects.  He currently feels well and is asymptomatic. He continues to remain active and playing tennis throughout the week.  He has no neurologic complaints. He does not complain of weakness or fatigue today.  He has a good appetite and denies weight loss.  He has no chest pain, shortness of breath, cough, or hemoptysis.  He denies any nausea, vomiting, constipation, or diarrhea.  He has no melena or hematochezia.  He has no urinary complaints.  Patient offers no specific complaints today.  REVIEW OF SYSTEMS:   Review of Systems  Constitutional: Negative.  Negative for fever, malaise/fatigue and weight loss.  Respiratory: Negative.  Negative for cough, hemoptysis and shortness of breath.   Cardiovascular: Negative.  Negative for chest pain and leg swelling.  Gastrointestinal: Negative.  Negative for abdominal pain, blood in stool and melena.  Genitourinary: Negative.  Negative for hematuria.  Musculoskeletal: Negative.  Negative for back pain.  Skin: Negative.  Negative for rash.  Neurological: Negative.  Negative for dizziness, focal weakness, weakness and headaches.  Psychiatric/Behavioral: Negative.  The patient is not nervous/anxious.     As per HPI. Otherwise, a complete review of systems is negative.  PAST MEDICAL HISTORY: Past Medical History:  Diagnosis Date  . A-fib ([place])   . Anemia   . Arthritis    ankles  . Cancer ([place])    multiple myeloma  .  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[place]) 10/26/2019  . Cardiomyopathy ([place])   . CHF (congestive heart failure) ([place])   . Depression   . Dysrhythmia    atrial fib  . HLD (hyperlipidemia)   . Hypercholesteremia   . Hypertension   . Moderate tricuspid insufficiency   . Multiple myeloma ([place])    not being treated right now per pt 11/11/19  . Pneumonia   . Sleep apnea    No CPAP    PAST SURGICAL HISTORY: Past Surgical History:  Procedure Laterality Date  . ATRIAL ABLATION SURGERY    . CARDIAC SURGERY     A-Fib Ablations  . COLONOSCOPY WITH PROPOFOL N/A 10/26/2019   Procedure: COLONOSCOPY WITH BIOPSY;  Surgeon: Lucilla Lame, MD;  Location: [facility];  Service: Endoscopy;  Laterality: N/A;  . ESOPHAGOGASTRODUODENOSCOPY (EGD) WITH PROPOFOL N/A 10/26/2019   Procedure: ESOPHAGOGASTRODUODENOSCOPY (EGD) WITH BIOPSY;  Surgeon: Lucilla Lame, MD;  Location: [facility];  Service: Endoscopy;  Laterality: N/A;  sleep apnea  . LAPAROSCOPIC PARTIAL COLECTOMY Right 11/17/2019   Procedure: LAPAROSCOPIC PARTIAL COLECTOMY RIGHT EXTENDED;  Surgeon: Olean Ree, MD;  Location: [facility];  Service: General;  Laterality: Right;  . PORTACATH PLACEMENT N/A 12/28/2019   Procedure: INSERTION PORT-A-CATH Left subclavian;  Surgeon: Olean Ree, MD;  Location: [facility];  Service: General;  Laterality: N/A;    FAMILY HISTORY: Family History  Problem Relation Age of Onset  . Healthy Mother     ADVANCED DIRECTIVES (Y/N):  N  HEALTH MAINTENANCE: Social History   Tobacco Use  . Smoking status: Never Smoker  . Smokeless tobacco: Never Used  Substance Use Topics  . Alcohol use: Not Currently  . Drug use: Never     Colonoscopy:  PAP:  Bone density:  Lipid panel:  No Known Allergies  Current Outpatient Medications  Medication Sig Dispense Refill  . APPLE CIDER VINEGAR PO Take 30-45 mLs by mouth every other day.     Marland Kitchen aspirin 325 MG EC tablet Take 325 mg by mouth every other day. In the  morning    . digoxin (DIGOX) 0.25 MG tablet Take 0.25 mg by mouth daily.     . furosemide (LASIX) 80 MG tablet Take 80 mg by mouth 2 (two) times daily. Morning & late afternoon    . lidocaine-prilocaine (EMLA) cream Apply to affected area once 30 g 3  . Misc Natural Products (TART CHERRY ADVANCED PO) Take 60 mLs by mouth every other day.    . ondansetron (ZOFRAN) 8 MG tablet Take 1 tablet (8 mg total) by mouth 2 (two) times daily as needed for refractory nausea / vomiting. 60 tablet 2  . oxyCODONE (OXY IR/ROXICODONE) 5 MG immediate release tablet Take 1 tablet (5 mg total) by mouth every 4 (four) hours as needed for moderate pain or severe pain. 30 tablet 0  . potassium bicarbonate (KLOR-CON/EF) 25 MEQ disintegrating tablet Take 25 mEq by mouth 4 (four) times a week. In the afternoon.    . prochlorperazine (COMPAZINE) 10 MG tablet Take 1 tablet (10 mg total) by mouth every 6 (six) hours as needed (Nausea or vomiting). 60 tablet 2   No current facility-administered medications for this visit.    OBJECTIVE: Vitals:   03/02/20 0911  BP: 120/73  Pulse: (!) 57  Resp: 20  Temp: (!) 95.4 F (35.2 C)  SpO2: 100%     Body mass index is 30.36 kg/m.    ECOG FS:0 - Asymptomatic  General: Well-developed, well-nourished, no acute distress. Eyes: Pink conjunctiva, anicteric sclera. HEENT: Normocephalic, moist mucous membranes. Lungs: No audible wheezing or coughing. Heart: Regular rate and rhythm. Abdomen: Soft, nontender, no obvious distention. Musculoskeletal: No edema, cyanosis, or clubbing. Neuro: Alert, answering all questions appropriately. Cranial nerves grossly intact. Skin: No rashes or petechiae noted. Psych: Normal affect.  LAB RESULTS:  Lab Results  Component Value Date   NA 140 03/02/2020   K 3.6 03/02/2020   CL 102 03/02/2020   CO2 30 03/02/2020   GLUCOSE 98 03/02/2020   BUN 16 03/02/2020   CREATININE 1.66 (H) 03/02/2020   CALCIUM 8.5 (L) 03/02/2020   PROT 7.4  03/02/2020   ALBUMIN 3.4 (L) 03/02/2020   AST 29 03/02/2020   ALT 15 03/02/2020   ALKPHOS 114 03/02/2020   BILITOT 0.8 03/02/2020   GFRNONAA 43 (L) 03/02/2020   GFRAA 50 (L) 03/02/2020    Lab Results  Component Value Date   WBC 5.9 03/02/2020   NEUTROABS 3.8 03/02/2020   HGB 10.4 (L) 03/02/2020   HCT 33.3 (L) 03/02/2020   MCV 89.5 03/02/2020   PLT 77 (L) 03/02/2020   Lab Results  Component Value Date   IRON 35 (L) 09/09/2019   TIBC 357 09/09/2019   IRONPCTSAT 10 (L) 09/09/2019   Lab Results  Component Value Date   FERRITIN 11 (L) 09/09/2019     STUDIES: No results found.  ASSESSMENT: Stage IIIc colon cancer, smoldering myeloma.  PLAN:    1.  Stage IIIc colon cancer: Imaging and final pathology reviewed independently confirming stage of disease.  Patient has agreed to adjuvant FOLFOX chemotherapy every 2 weeks  for a total of 12 cycles. Patient has now had port placement.  Proceed with cycle 5 of FOLFOX today.  Given his persistent neutropenia, will add Udenyca to the remainder of his treatments.  Return to clinic in 2 days for pump removal and Udenyca.  Because of patient's recurring thrombocytopenia, he will return to clinic in 3 weeks for further evaluation and consideration of cycle 6.  2. Smoldering myeloma: Patient was initially diagnosed and treated in [place], [place] and treated with single agent Velcade in approximately 2013.  He was evaluated in clinic in July 2014 when he declined any maintenance treatment or referral for bone marrow transplant.  He was subsequently lost to follow-up.  His most recent SPEP revealed an M spike of 2.0 with an IgG predominance of 2949.  These are unchanged from 3 months prior.  He also has a suppressed IgA level.  Kappa/lambda light chain ratio has trended up slightly to 2.17.  He has mild renal insufficiency, but no other evidence of endorgan damage.  Metastatic bone survey on May 20, 2019 reviewed independently with no obvious bony  lesions.  Bone marrow biopsy completed on May 28, 2019 revealed only 10 to 15% plasma cells with no clonality reported.  Cytogenetics were also reported as normal.  No intervention is needed at this time.  Continue to monitor closely. 3.  Iron deficiency anemia: Hemoglobin has trended down slightly to 10.4, monitor.  He last received IV Feraheme on October 06, 2019.   4.  Renal insufficiency: Chronic and unchanged.  Patient's creatinine has trended up slightly to 1.66. 5.  Peripheral neuropathy: Chronic and unchanged.  Patient does not complain of this today. 6.  Neutropenia: Improved.  Proceed with treatment today and add Udenyca with pump removal as above. 7. Thrombocytopenia: Proceed cautiously with treatment as above.  Return to clinic in 3 weeks. 8.  Hypokalemia: Resolved.  Continue oral potassium supplementation.  Patient expressed understanding and was in agreement with this plan. He also understands that He can call clinic at any time with any questions, concerns, or complaints.    Lloyd Huger, MD   03/03/2020 6:29 AM

## 2020-03-01 ENCOUNTER — Encounter: Payer: Self-pay | Admitting: Oncology

## 2020-03-01 NOTE — Progress Notes (Signed)
Patient was called for pre assessment. He states he is doing good and denies any pain. Would like to discuss with provider if it is ok to get treatment and his 2nd Covid vaccine in same day. He states he is scheduled for 2nd covid vaccine tomorrow at 3 pm.

## 2020-03-02 ENCOUNTER — Inpatient Hospital Stay: Payer: BC Managed Care – PPO

## 2020-03-02 ENCOUNTER — Other Ambulatory Visit: Payer: Self-pay

## 2020-03-02 ENCOUNTER — Encounter: Payer: Self-pay | Admitting: Oncology

## 2020-03-02 ENCOUNTER — Inpatient Hospital Stay (HOSPITAL_BASED_OUTPATIENT_CLINIC_OR_DEPARTMENT_OTHER): Payer: BC Managed Care – PPO | Admitting: Oncology

## 2020-03-02 VITALS — BP 120/73 | HR 57 | Temp 95.4°F | Resp 20 | Wt 205.6 lb

## 2020-03-02 DIAGNOSIS — Z5111 Encounter for antineoplastic chemotherapy: Secondary | ICD-10-CM | POA: Diagnosis not present

## 2020-03-02 DIAGNOSIS — C184 Malignant neoplasm of transverse colon: Secondary | ICD-10-CM | POA: Diagnosis not present

## 2020-03-02 LAB — COMPREHENSIVE METABOLIC PANEL
ALT: 15 U/L (ref 0–44)
AST: 29 U/L (ref 15–41)
Albumin: 3.4 g/dL — ABNORMAL LOW (ref 3.5–5.0)
Alkaline Phosphatase: 114 U/L (ref 38–126)
Anion gap: 8 (ref 5–15)
BUN: 16 mg/dL (ref 8–23)
CO2: 30 mmol/L (ref 22–32)
Calcium: 8.5 mg/dL — ABNORMAL LOW (ref 8.9–10.3)
Chloride: 102 mmol/L (ref 98–111)
Creatinine, Ser: 1.66 mg/dL — ABNORMAL HIGH (ref 0.61–1.24)
GFR calc Af Amer: 50 mL/min — ABNORMAL LOW (ref >60)
GFR calc non Af Amer: 43 mL/min — ABNORMAL LOW (ref >60)
Glucose, Bld: 98 mg/dL (ref 70–99)
Potassium: 3.6 mmol/L (ref 3.5–5.1)
Sodium: 140 mmol/L (ref 135–145)
Total Bilirubin: 0.8 mg/dL (ref 0.3–1.2)
Total Protein: 7.4 g/dL (ref 6.5–8.1)

## 2020-03-02 LAB — CBC WITH DIFFERENTIAL/PLATELET
Abs Immature Granulocytes: 0.2 10*3/uL — ABNORMAL HIGH (ref 0.00–0.07)
Basophils Absolute: 0 10*3/uL (ref 0.0–0.1)
Basophils Relative: 0 %
Eosinophils Absolute: 0 10*3/uL (ref 0.0–0.5)
Eosinophils Relative: 1 %
HCT: 33.3 % — ABNORMAL LOW (ref 39.0–52.0)
Hemoglobin: 10.4 g/dL — ABNORMAL LOW (ref 13.0–17.0)
Immature Granulocytes: 3 %
Lymphocytes Relative: 16 %
Lymphs Abs: 0.9 10*3/uL (ref 0.7–4.0)
MCH: 28 pg (ref 26.0–34.0)
MCHC: 31.2 g/dL (ref 30.0–36.0)
MCV: 89.5 fL (ref 80.0–100.0)
Monocytes Absolute: 0.9 10*3/uL (ref 0.1–1.0)
Monocytes Relative: 15 %
Neutro Abs: 3.8 10*3/uL (ref 1.7–7.7)
Neutrophils Relative %: 65 %
Platelets: 77 10*3/uL — ABNORMAL LOW (ref 150–400)
RBC: 3.72 MIL/uL — ABNORMAL LOW (ref 4.22–5.81)
RDW: 17.9 % — ABNORMAL HIGH (ref 11.5–15.5)
WBC: 5.9 10*3/uL (ref 4.0–10.5)
nRBC: 0.3 % — ABNORMAL HIGH (ref 0.0–0.2)

## 2020-03-02 MED ORDER — SODIUM CHLORIDE 0.9 % IV SOLN
2400.0000 mg/m2 | INTRAVENOUS | Status: DC
  Administered 2020-03-02: 5150 mg via INTRAVENOUS
  Filled 2020-03-02: qty 100

## 2020-03-02 MED ORDER — PALONOSETRON HCL INJECTION 0.25 MG/5ML
0.2500 mg | Freq: Once | INTRAVENOUS | Status: AC
  Administered 2020-03-02: 0.25 mg via INTRAVENOUS
  Filled 2020-03-02: qty 5

## 2020-03-02 MED ORDER — SODIUM CHLORIDE 0.9 % IV SOLN
10.0000 mg | Freq: Once | INTRAVENOUS | Status: AC
  Administered 2020-03-02: 10 mg via INTRAVENOUS
  Filled 2020-03-02: qty 10

## 2020-03-02 MED ORDER — DEXTROSE 5 % IV SOLN
Freq: Once | INTRAVENOUS | Status: AC
  Filled 2020-03-02: qty 250

## 2020-03-02 MED ORDER — FLUOROURACIL CHEMO INJECTION 2.5 GM/50ML
400.0000 mg/m2 | Freq: Once | INTRAVENOUS | Status: AC
  Administered 2020-03-02: 850 mg via INTRAVENOUS
  Filled 2020-03-02: qty 17

## 2020-03-02 MED ORDER — LEUCOVORIN CALCIUM INJECTION 350 MG
850.0000 mg | Freq: Once | INTRAVENOUS | Status: AC
  Administered 2020-03-02: 850 mg via INTRAVENOUS
  Filled 2020-03-02: qty 25

## 2020-03-02 MED ORDER — OXALIPLATIN CHEMO INJECTION 100 MG/20ML
85.0000 mg/m2 | Freq: Once | INTRAVENOUS | Status: AC
  Administered 2020-03-02: 185 mg via INTRAVENOUS
  Filled 2020-03-02: qty 37

## 2020-03-02 NOTE — Progress Notes (Signed)
Pt denies any concerns today. Cr 1.66. Ok to proceed with chemo per Dr. Grayland Ormond.

## 2020-03-02 NOTE — Progress Notes (Signed)
Cr 1.66, ok to proceed per md

## 2020-03-03 LAB — CEA: CEA: 6.9 ng/mL — ABNORMAL HIGH (ref 0.0–4.7)

## 2020-03-04 ENCOUNTER — Other Ambulatory Visit: Payer: Self-pay

## 2020-03-04 ENCOUNTER — Inpatient Hospital Stay: Payer: BC Managed Care – PPO

## 2020-03-04 VITALS — BP 128/76 | HR 53 | Temp 96.8°F | Resp 18

## 2020-03-04 DIAGNOSIS — Z5111 Encounter for antineoplastic chemotherapy: Secondary | ICD-10-CM | POA: Diagnosis not present

## 2020-03-04 DIAGNOSIS — C184 Malignant neoplasm of transverse colon: Secondary | ICD-10-CM

## 2020-03-04 MED ORDER — PEGFILGRASTIM-CBQV 6 MG/0.6ML ~~LOC~~ SOSY
6.0000 mg | PREFILLED_SYRINGE | Freq: Once | SUBCUTANEOUS | Status: AC
  Administered 2020-03-04: 14:00:00 6 mg via SUBCUTANEOUS
  Filled 2020-03-04: qty 0.6

## 2020-03-04 MED ORDER — HEPARIN SOD (PORK) LOCK FLUSH 100 UNIT/ML IV SOLN
500.0000 [IU] | Freq: Once | INTRAVENOUS | Status: AC | PRN
  Administered 2020-03-04: 500 [IU]
  Filled 2020-03-04: qty 5

## 2020-03-04 MED ORDER — SODIUM CHLORIDE 0.9% FLUSH
10.0000 mL | INTRAVENOUS | Status: DC | PRN
  Administered 2020-03-04: 10 mL
  Filled 2020-03-04: qty 10

## 2020-03-14 ENCOUNTER — Ambulatory Visit: Payer: BC Managed Care – PPO | Admitting: Surgery

## 2020-03-14 ENCOUNTER — Encounter: Payer: Self-pay | Admitting: Surgery

## 2020-03-14 ENCOUNTER — Ambulatory Visit (INDEPENDENT_AMBULATORY_CARE_PROVIDER_SITE_OTHER): Payer: BC Managed Care – PPO | Admitting: Surgery

## 2020-03-14 ENCOUNTER — Other Ambulatory Visit: Payer: Self-pay

## 2020-03-14 VITALS — BP 128/83 | HR 99 | Temp 97.8°F | Resp 12 | Ht 69.0 in | Wt 207.2 lb

## 2020-03-14 DIAGNOSIS — C184 Malignant neoplasm of transverse colon: Secondary | ICD-10-CM | POA: Diagnosis not present

## 2020-03-14 NOTE — Progress Notes (Signed)
03/14/2020  History of Present Illness: Chris George is a 64 y.o. 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  He's s/p lap extended right colectomy on 11/17/19.  He's also s/p port-a-cath placement on 12/28/19 and is currently undergoing FOLFOX therapy with Dr. Grayland Ormond.  Patient presents today for 4 month follow up.  He has been doing very well.  He reports that he's been eating well, denies any nausea or vomiting, reports staying active and playing tennis and currently went to the Semi-Finals of a Singles' tournament he played in.  Of note, he reports that since the last round of chemotherapy, he has noted some sporadic discomfort around the port-a-cath site, unrelated to any physical activity.  His stools are mostly formed around 60% of the time, with somewhat loose stools about 30% and then watery about 10% of the time.  Past Medical History: Past Medical History:  Diagnosis Date  . A-fib ([place])   . Anemia   . Arthritis    ankles  . Cancer ([place])    multiple myeloma  . 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[place]) 10/26/2019  . Cardiomyopathy ([place])   . CHF (congestive heart failure) ([place])   . Depression   . Dysrhythmia    atrial fib  . HLD (hyperlipidemia)   . Hypercholesteremia   . Hypertension   . Moderate tricuspid insufficiency   . Multiple myeloma ([place])    not being treated right now per pt 11/11/19  . Pneumonia   . Sleep apnea    No CPAP     Past Surgical History: Past Surgical History:  Procedure Laterality Date  . ATRIAL ABLATION SURGERY    . CARDIAC SURGERY     A-Fib Ablations  . COLONOSCOPY WITH PROPOFOL N/A 10/26/2019   Procedure: COLONOSCOPY WITH BIOPSY;  Surgeon: Lucilla Lame, MD;  Location: [place];  Service: Endoscopy;  Laterality: N/A;  . ESOPHAGOGASTRODUODENOSCOPY (EGD) WITH PROPOFOL N/A 10/26/2019   Procedure: ESOPHAGOGASTRODUODENOSCOPY (EGD) WITH BIOPSY;  Surgeon: Lucilla Lame, MD;  Location: [place];  Service:  Endoscopy;  Laterality: N/A;  sleep apnea  . LAPAROSCOPIC PARTIAL COLECTOMY Right 11/17/2019   Procedure: LAPAROSCOPIC PARTIAL COLECTOMY RIGHT EXTENDED;  Surgeon: Olean Ree, MD;  Location: [facility];  Service: General;  Laterality: Right;  . PORTACATH PLACEMENT N/A 12/28/2019   Procedure: INSERTION PORT-A-CATH Left subclavian;  Surgeon: Olean Ree, MD;  Location: [facility];  Service: General;  Laterality: N/A;    Home Medications: Prior to Admission medications   Medication Sig Start Date End Date Taking? Authorizing Provider  APPLE CIDER VINEGAR PO Take 30-45 mLs by mouth every other day.    Yes [provider]  aspirin 325 MG EC tablet Take 325 mg by mouth every other day. In the morning   Yes [provider]  digoxin (DIGOX) 0.25 MG tablet Take 0.25 mg by mouth daily.    Yes [provider]  furosemide (LASIX) 80 MG tablet Take 80 mg by mouth 2 (two) times daily. Morning & late afternoon   Yes [provider]  lidocaine-prilocaine (EMLA) cream Apply to affected area once 12/02/19  Yes Finnegan, Kathlene November, MD  Misc Natural Products (TART CHERRY ADVANCED PO) Take 60 mLs by mouth every other day.   Yes [provider]  potassium bicarbonate (KLOR-CON/EF) 25 MEQ disintegrating tablet Take 25 mEq by mouth 4 (four) times a week. In the afternoon.   Yes [provider]  prochlorperazine (COMPAZINE) 10 MG tablet Take 1  tablet (10 mg total) by mouth every 6 (six) hours as needed (Nausea or vomiting). 12/02/19  Yes Lloyd Huger, MD    Allergies: No Known Allergies  Review of Systems: Review of Systems  Constitutional: Negative for chills and fever.  Respiratory: Negative for shortness of breath.   Cardiovascular: Negative for chest pain.  Gastrointestinal: Negative for abdominal pain, constipation, diarrhea, nausea and vomiting.  Musculoskeletal: Negative for myalgias.    Physical Exam BP 128/83   Pulse 99   Temp 97.8 F (36.6  C)   Resp 12   Ht '5\' 9"'$  (1.753 m)   Wt 207 lb 3.2 oz (94 kg)   SpO2 99%   BMI 30.60 kg/m  CONSTITUTIONAL: No acute distress HEENT:  Normocephalic, atraumatic, extraocular motion intact. RESPIRATORY:  Lungs are clear, and breath sounds are equal bilaterally. Normal respiratory effort without pathologic use of accessory muscles. CARDIOVASCULAR: Heart is regular without murmurs, gallops, or rubs. GI: The abdomen is soft, non-distended, non-tender.  Incisions are well healed, clean, dry, without any evidence of hernia. SKIN:  Left subclavian port-a-cath site is well healed, without any erythema or evidence for infection.  The port is in good position and does not flip with manipulation. PSYCH:  Alert and oriented to person, place and time. Affect is normal.  Labs/Imaging: Labs 03/02/20: Sodium 140, potassium 3.6, chloride 102, CO2 30, BUN 16, creatinine 1.66.   Total bilirubin 0.8, AST 29, ALT 15, alkaline phosphatase 114, albumin 3.4.  WBC 5.9, hemoglobin 10.4, hematocrit 33.3, platelet 77.   CEA 6.9   Assessment and Plan: This is a 64 y.o. male status post laparoscopic extended right colectomy for proximal transverse colon cancer in 11/2019.  -Patient is doing very well from a surgical standpoint without any evidence of hernia and with good return to physical activity and energy levels.  He is currently undergoing treatment with FOLFOX with Dr. Grayland Ormond has undergone 5 out of 12 cycles. -Discussed with the patient that the Port-A-Cath site appears to be well-healed without any evidence of infection.  All of the port itself is a little bit mobile, it is not flipping and appears to be secured.  At this point I do not think we need to do any revisions and this may simply be some scar tissue that is sporadically causing some discomfort.  We will keep an eye on this. -CEA lab value is 6.9 on 02/23/2020.  This is his first CEA and will continue trends for now. -Encouraged patient to increase his  fiber intake whether it be with Metamucil or Benefiber or with any supplemental fiber snacks.  This may help improve the bulk consistency of stools. -Follow-up with me in 4 months with repeat CEA.  Face-to-face time spent with the patient and care providers was 15 minutes, with more than 50% of the time spent counseling, educating, and coordinating care of the patient.     Melvyn Neth, [facility]

## 2020-03-14 NOTE — Patient Instructions (Addendum)
You will hear from our office in August 2021 to schedule your visit for September 2021. You will need to get CEA lab work done prior to appointment. We will remind you of this when our office calls you.   You may start taking metamucil or fiber to help with your bowel movements.   Contact the office if you have any questions or concerns.

## 2020-03-15 NOTE — Progress Notes (Signed)

## 2020-03-20 NOTE — Progress Notes (Signed)
Chris George  [phone] Fax:(336) [phone]  ID: Chris George OB: 1955-11-23  MR#: [id number]  XBD#:[id number]  Patient Care Team: Sofie Hartigan, MD as PCP - General (Family Medicine) Lloyd Huger, MD as Consulting Physician (Oncology)  CHIEF COMPLAINT: Stage IIIc colon cancer, smoldering myeloma.  INTERVAL HISTORY: Patient returns to clinic today for further evaluation and consideration of cycle 6 of 12 of adjuvant FOLFOX.  He continues to tolerate his treatments well without significant side effects.  He currently feels well and is asymptomatic. He continues to remain active and playing tennis throughout the week.  He has no neurologic complaints. He does not complain of weakness or fatigue today.  He has a good appetite and denies weight loss.  He has no chest pain, shortness of breath, cough, or hemoptysis.  He denies any nausea, vomiting, constipation, or diarrhea.  He has no melena or hematochezia.  He has no urinary complaints.  Patient offers no specific complaints today.  REVIEW OF SYSTEMS:   Review of Systems  Constitutional: Negative.  Negative for fever, malaise/fatigue and weight loss.  Respiratory: Negative.  Negative for cough, hemoptysis and shortness of breath.   Cardiovascular: Negative.  Negative for chest pain and leg swelling.  Gastrointestinal: Negative.  Negative for abdominal pain, blood in stool and melena.  Genitourinary: Negative.  Negative for hematuria.  Musculoskeletal: Negative.  Negative for back pain.  Skin: Negative.  Negative for rash.  Neurological: Negative.  Negative for dizziness, focal weakness, weakness and headaches.  Psychiatric/Behavioral: Negative.  The patient is not nervous/anxious.     As per HPI. Otherwise, a complete review of systems is negative.  PAST MEDICAL HISTORY: Past Medical History:  Diagnosis Date  . A-fib ([place])   . Anemia   . Arthritis    ankles  . Cancer ([place])    multiple  myeloma  . 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[place]) 10/26/2019  . Cardiomyopathy ([place])   . CHF (congestive heart failure) ([place])   . Depression   . Dysrhythmia    atrial fib  . HLD (hyperlipidemia)   . Hypercholesteremia   . Hypertension   . Moderate tricuspid insufficiency   . Multiple myeloma ([place])    not being treated right now per pt 11/11/19  . Pneumonia   . Sleep apnea    No CPAP    PAST SURGICAL HISTORY: Past Surgical History:  Procedure Laterality Date  . ATRIAL ABLATION SURGERY    . CARDIAC SURGERY     A-Fib Ablations  . COLONOSCOPY WITH PROPOFOL N/A 10/26/2019   Procedure: COLONOSCOPY WITH BIOPSY;  Surgeon: Lucilla Lame, MD;  Location: [place];  Service: Endoscopy;  Laterality: N/A;  . ESOPHAGOGASTRODUODENOSCOPY (EGD) WITH PROPOFOL N/A 10/26/2019   Procedure: ESOPHAGOGASTRODUODENOSCOPY (EGD) WITH BIOPSY;  Surgeon: Lucilla Lame, MD;  Location: [place];  Service: Endoscopy;  Laterality: N/A;  sleep apnea  . LAPAROSCOPIC PARTIAL COLECTOMY Right 11/17/2019   Procedure: LAPAROSCOPIC PARTIAL COLECTOMY RIGHT EXTENDED;  Surgeon: Olean Ree, MD;  Location: [facility];  Service: General;  Laterality: Right;  . PORTACATH PLACEMENT N/A 12/28/2019   Procedure: INSERTION PORT-A-CATH Left subclavian;  Surgeon: Olean Ree, MD;  Location: [facility];  Service: General;  Laterality: N/A;    FAMILY HISTORY: Family History  Problem Relation Age of Onset  . Healthy Mother     ADVANCED DIRECTIVES (Y/N):  N  HEALTH MAINTENANCE: Social History   Tobacco Use  . Smoking status: Never Smoker  . Smokeless tobacco: Never Used  Substance Use Topics  . Alcohol use: Not Currently  . Drug use: Never     Colonoscopy:  PAP:  Bone density:  Lipid panel:  No Known Allergies  Current Outpatient Medications  Medication Sig Dispense Refill  . APPLE CIDER VINEGAR PO Take 30-45 mLs by mouth every other day.     Marland Kitchen aspirin 325 MG EC tablet Take 325 mg by mouth every other  day. In the morning    . digoxin (DIGOX) 0.25 MG tablet Take 0.25 mg by mouth daily.     . furosemide (LASIX) 80 MG tablet Take 80 mg by mouth 2 (two) times daily. Morning & late afternoon    . lidocaine-prilocaine (EMLA) cream Apply to affected area once 30 g 3  . Misc Natural Products (TART CHERRY ADVANCED PO) Take 60 mLs by mouth every other day.    . potassium bicarbonate (KLOR-CON/EF) 25 MEQ disintegrating tablet Take 25 mEq by mouth 4 (four) times a week. In the afternoon.    . prochlorperazine (COMPAZINE) 10 MG tablet Take 1 tablet (10 mg total) by mouth every 6 (six) hours as needed (Nausea or vomiting). 60 tablet 2   No current facility-administered medications for this visit.   Facility-Administered Medications Ordered in Other Visits  Medication Dose Route Frequency Provider Last Rate Last Admin  . dexamethasone (DECADRON) 10 mg in sodium chloride 0.9 % 50 mL IVPB  10 mg Intravenous Once Lloyd Huger, MD      . fluorouracil (ADRUCIL) 5,150 mg in sodium chloride 0.9 % 147 mL chemo infusion  2,400 mg/m2 (Treatment Plan Recorded) Intravenous 1 day or 1 dose Lloyd Huger, MD      . fluorouracil (ADRUCIL) chemo injection 850 mg  400 mg/m2 (Treatment Plan Recorded) Intravenous Once Lloyd Huger, MD      . leucovorin 850 mg in dextrose 5 % 250 mL infusion  395 mg/m2 (Treatment Plan Recorded) Intravenous Once Lloyd Huger, MD      . oxaliplatin (ELOXATIN) 185 mg in dextrose 5 % 500 mL chemo infusion  85 mg/m2 (Treatment Plan Recorded) Intravenous Once Lloyd Huger, MD      . palonosetron (ALOXI) injection 0.25 mg  0.25 mg Intravenous Once Lloyd Huger, MD      . sodium chloride flush (NS) 0.9 % injection 10 mL  10 mL Intravenous PRN Lloyd Huger, MD   10 mL at 03/23/20 0848    OBJECTIVE: Vitals:   03/23/20 0923  BP: 118/70  Pulse: 62  Resp: 20  Temp: (!) 96.8 F (36 C)  SpO2: 100%     Body mass index is 30.17 kg/m.    ECOG FS:0 -  Asymptomatic  General: Well-developed, well-nourished, no acute distress. Eyes: Pink conjunctiva, anicteric sclera. HEENT: Normocephalic, moist mucous membranes. Lungs: No audible wheezing or coughing. Heart: Regular rate and rhythm. Abdomen: Soft, nontender, no obvious distention. Musculoskeletal: No edema, cyanosis, or clubbing. Neuro: Alert, answering all questions appropriately. Cranial nerves grossly intact. Skin: No rashes or petechiae noted. Psych: Normal affect.   LAB RESULTS:  Lab Results  Component Value Date   NA 140 03/23/2020   K 3.0 (L) 03/23/2020   CL 103 03/23/2020   CO2 29 03/23/2020   GLUCOSE 102 (H) 03/23/2020   BUN 16 03/23/2020   CREATININE 1.39 (H) 03/23/2020   CALCIUM 8.2 (L) 03/23/2020   PROT 7.0 03/23/2020   ALBUMIN 3.0 (L) 03/23/2020   AST 32 03/23/2020   ALT 15 03/23/2020  ALKPHOS 137 (H) 03/23/2020   BILITOT 1.0 03/23/2020   GFRNONAA 53 (L) 03/23/2020   GFRAA >60 03/23/2020    Lab Results  Component Value Date   WBC 4.2 03/23/2020   NEUTROABS 2.6 03/23/2020   HGB 9.8 (L) 03/23/2020   HCT 30.9 (L) 03/23/2020   MCV 90.9 03/23/2020   PLT 112 (L) 03/23/2020   Lab Results  Component Value Date   IRON 35 (L) 09/09/2019   TIBC 357 09/09/2019   IRONPCTSAT 10 (L) 09/09/2019   Lab Results  Component Value Date   FERRITIN 11 (L) 09/09/2019     STUDIES: No results found.  ASSESSMENT: Stage IIIc colon cancer, smoldering myeloma.  PLAN:    1.  Stage IIIc colon cancer: Imaging and final pathology reviewed independently confirming stage of disease.  Patient has agreed to adjuvant FOLFOX chemotherapy for 12 cycles. Patient has now had port placement.  Proceed with cycle 6 of FOLFOX today.  Given his persistent neutropenia, will add Udenyca with his pump removal to the remainder of his treatments.  Because of persistent thrombocytopenia, patient will now receive treatment every 3 weeks.  Return to clinic in 2 days for pump removal and Udenyca  and then in 3 weeks for further evaluation and consideration of cycle 7.  2. 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  He was evaluated in clinic in July 2014 when he declined any maintenance treatment or referral for bone marrow transplant.  He was subsequently lost to follow-up.  His most recent SPEP revealed an M spike of 2.0 with an IgG predominance of 2949.  These are unchanged from 3 months prior.  He also has a suppressed IgA level.  Kappa/lambda light chain ratio has trended up slightly to 2.17.  He has mild renal insufficiency, but no other evidence of endorgan damage.  Metastatic bone survey on May 20, 2019 reviewed independently with no obvious bony lesions.  Bone marrow biopsy completed on May 28, 2019 revealed only 10 to 15% plasma cells with no clonality reported.  Cytogenetics were also reported as normal.  No intervention is needed at this time.  Continue to monitor closely. 3.  Iron deficiency anemia: Hemoglobin has trended down slightly to 9.8.  He last received IV Feraheme on October 06, 2019.   4.  Renal insufficiency: Improved.  Patient's creatinine is 1.39 today. 5.  Peripheral neuropathy: Chronic and unchanged.  Patient does not complain of this today. 6.  Neutropenia: Resolved.  Proceed with treatment today and add Udenyca with pump removal as above. 7. Thrombocytopenia: Platelets improved to 112.  Patient will receive treatment every 3 weeks as above. 8.  Hypokalemia: Potassium has trended down to 3.0.  Continue oral potassium supplementation as prescribed.  Patient expressed understanding and was in agreement with this plan. He also understands that He can call clinic at any time with any questions, concerns, or complaints.    Lloyd Huger, MD   03/23/2020 9:49 AM

## 2020-03-23 ENCOUNTER — Inpatient Hospital Stay: Payer: BC Managed Care – PPO | Attending: Oncology

## 2020-03-23 ENCOUNTER — Inpatient Hospital Stay (HOSPITAL_BASED_OUTPATIENT_CLINIC_OR_DEPARTMENT_OTHER): Payer: BC Managed Care – PPO | Admitting: Oncology

## 2020-03-23 ENCOUNTER — Other Ambulatory Visit: Payer: Self-pay

## 2020-03-23 ENCOUNTER — Encounter: Payer: Self-pay | Admitting: Oncology

## 2020-03-23 ENCOUNTER — Inpatient Hospital Stay: Payer: BC Managed Care – PPO

## 2020-03-23 VITALS — BP 118/70 | HR 62 | Temp 96.8°F | Resp 20 | Wt 204.3 lb

## 2020-03-23 DIAGNOSIS — C184 Malignant neoplasm of transverse colon: Secondary | ICD-10-CM | POA: Diagnosis not present

## 2020-03-23 DIAGNOSIS — Z95828 Presence of other vascular implants and grafts: Secondary | ICD-10-CM

## 2020-03-23 DIAGNOSIS — Z5111 Encounter for antineoplastic chemotherapy: Secondary | ICD-10-CM | POA: Insufficient documentation

## 2020-03-23 DIAGNOSIS — C189 Malignant neoplasm of colon, unspecified: Secondary | ICD-10-CM | POA: Diagnosis present

## 2020-03-23 DIAGNOSIS — Z5189 Encounter for other specified aftercare: Secondary | ICD-10-CM | POA: Diagnosis not present

## 2020-03-23 LAB — CBC WITH DIFFERENTIAL/PLATELET
Abs Immature Granulocytes: 0.05 10*3/uL (ref 0.00–0.07)
Basophils Absolute: 0 10*3/uL (ref 0.0–0.1)
Basophils Relative: 0 %
Eosinophils Absolute: 0 10*3/uL (ref 0.0–0.5)
Eosinophils Relative: 1 %
HCT: 30.9 % — ABNORMAL LOW (ref 39.0–52.0)
Hemoglobin: 9.8 g/dL — ABNORMAL LOW (ref 13.0–17.0)
Immature Granulocytes: 1 %
Lymphocytes Relative: 16 %
Lymphs Abs: 0.7 10*3/uL (ref 0.7–4.0)
MCH: 28.8 pg (ref 26.0–34.0)
MCHC: 31.7 g/dL (ref 30.0–36.0)
MCV: 90.9 fL (ref 80.0–100.0)
Monocytes Absolute: 0.9 10*3/uL (ref 0.1–1.0)
Monocytes Relative: 20 %
Neutro Abs: 2.6 10*3/uL (ref 1.7–7.7)
Neutrophils Relative %: 62 %
Platelets: 112 10*3/uL — ABNORMAL LOW (ref 150–400)
RBC: 3.4 MIL/uL — ABNORMAL LOW (ref 4.22–5.81)
RDW: 19.6 % — ABNORMAL HIGH (ref 11.5–15.5)
WBC: 4.2 10*3/uL (ref 4.0–10.5)
nRBC: 0 % (ref 0.0–0.2)

## 2020-03-23 LAB — COMPREHENSIVE METABOLIC PANEL
ALT: 15 U/L (ref 0–44)
AST: 32 U/L (ref 15–41)
Albumin: 3 g/dL — ABNORMAL LOW (ref 3.5–5.0)
Alkaline Phosphatase: 137 U/L — ABNORMAL HIGH (ref 38–126)
Anion gap: 8 (ref 5–15)
BUN: 16 mg/dL (ref 8–23)
CO2: 29 mmol/L (ref 22–32)
Calcium: 8.2 mg/dL — ABNORMAL LOW (ref 8.9–10.3)
Chloride: 103 mmol/L (ref 98–111)
Creatinine, Ser: 1.39 mg/dL — ABNORMAL HIGH (ref 0.61–1.24)
GFR calc Af Amer: >60 mL/min (ref >60)
GFR calc non Af Amer: 53 mL/min — ABNORMAL LOW (ref >60)
Glucose, Bld: 102 mg/dL — ABNORMAL HIGH (ref 70–99)
Potassium: 3 mmol/L — ABNORMAL LOW (ref 3.5–5.1)
Sodium: 140 mmol/L (ref 135–145)
Total Bilirubin: 1 mg/dL (ref 0.3–1.2)
Total Protein: 7 g/dL (ref 6.5–8.1)

## 2020-03-23 MED ORDER — FLUOROURACIL CHEMO INJECTION 2.5 GM/50ML
400.0000 mg/m2 | Freq: Once | INTRAVENOUS | Status: AC
  Administered 2020-03-23: 850 mg via INTRAVENOUS
  Filled 2020-03-23: qty 17

## 2020-03-23 MED ORDER — SODIUM CHLORIDE 0.9 % IV SOLN
10.0000 mg | Freq: Once | INTRAVENOUS | Status: AC
  Administered 2020-03-23: 10 mg via INTRAVENOUS
  Filled 2020-03-23: qty 10

## 2020-03-23 MED ORDER — PALONOSETRON HCL INJECTION 0.25 MG/5ML
0.2500 mg | Freq: Once | INTRAVENOUS | Status: AC
  Administered 2020-03-23: 0.25 mg via INTRAVENOUS
  Filled 2020-03-23: qty 5

## 2020-03-23 MED ORDER — OXALIPLATIN CHEMO INJECTION 100 MG/20ML
85.0000 mg/m2 | Freq: Once | INTRAVENOUS | Status: AC
  Administered 2020-03-23: 185 mg via INTRAVENOUS
  Filled 2020-03-23: qty 37

## 2020-03-23 MED ORDER — SODIUM CHLORIDE 0.9% FLUSH
10.0000 mL | INTRAVENOUS | Status: DC | PRN
  Administered 2020-03-23: 10 mL via INTRAVENOUS
  Filled 2020-03-23: qty 10

## 2020-03-23 MED ORDER — DEXTROSE 5 % IV SOLN
Freq: Once | INTRAVENOUS | Status: AC
  Filled 2020-03-23: qty 250

## 2020-03-23 MED ORDER — SODIUM CHLORIDE 0.9 % IV SOLN
2400.0000 mg/m2 | INTRAVENOUS | Status: DC
  Administered 2020-03-23: 5150 mg via INTRAVENOUS
  Filled 2020-03-23: qty 103

## 2020-03-23 MED ORDER — LEUCOVORIN CALCIUM INJECTION 350 MG
395.0000 mg/m2 | Freq: Once | INTRAVENOUS | Status: AC
  Administered 2020-03-23: 850 mg via INTRAVENOUS
  Filled 2020-03-23: qty 17.5

## 2020-03-23 NOTE — Progress Notes (Signed)
Patient here today for follow up. Reports having some very painful stomach aches. States only happens after he eats and usually goes away after BM. Just started happening in the last 10 days. Also having some trouble sleeping and states he is not taking any meds to help.  Reports he is excersising, eating good, and staying hydrated. No other issues to report at this time.

## 2020-03-25 ENCOUNTER — Inpatient Hospital Stay: Payer: BC Managed Care – PPO

## 2020-03-25 ENCOUNTER — Other Ambulatory Visit: Payer: Self-pay

## 2020-03-25 VITALS — BP 115/74 | HR 74 | Temp 96.8°F | Resp 18

## 2020-03-25 DIAGNOSIS — C189 Malignant neoplasm of colon, unspecified: Secondary | ICD-10-CM | POA: Diagnosis not present

## 2020-03-25 DIAGNOSIS — C184 Malignant neoplasm of transverse colon: Secondary | ICD-10-CM

## 2020-03-25 MED ORDER — HEPARIN SOD (PORK) LOCK FLUSH 100 UNIT/ML IV SOLN
INTRAVENOUS | Status: AC
  Filled 2020-03-25: qty 5

## 2020-03-25 MED ORDER — HEPARIN SOD (PORK) LOCK FLUSH 100 UNIT/ML IV SOLN
500.0000 [IU] | Freq: Once | INTRAVENOUS | Status: AC | PRN
  Administered 2020-03-25: 500 [IU]
  Filled 2020-03-25: qty 5

## 2020-03-25 MED ORDER — PEGFILGRASTIM-CBQV 6 MG/0.6ML ~~LOC~~ SOSY
6.0000 mg | PREFILLED_SYRINGE | Freq: Once | SUBCUTANEOUS | Status: AC
  Administered 2020-03-25: 6 mg via SUBCUTANEOUS
  Filled 2020-03-25: qty 0.6

## 2020-03-25 MED ORDER — SODIUM CHLORIDE 0.9% FLUSH
10.0000 mL | INTRAVENOUS | Status: DC | PRN
  Administered 2020-03-25: 10 mL
  Filled 2020-03-25: qty 10

## 2020-04-06 NOTE — Progress Notes (Signed)
Pharmacist Chemotherapy Monitoring - Follow Up Assessment    I verify that I have reviewed each item in the below checklist:  . Regimen for the patient is scheduled for the appropriate day and plan matches scheduled date. Marland Kitchen Appropriate non-routine labs are ordered dependent on drug ordered. . If applicable, additional medications reviewed and ordered per protocol based on lifetime cumulative doses and/or treatment regimen.   Plan for follow-up and/or issues identified: No . I-vent associated with next due treatment: No . MD and/or nursing notified: No  Joriel Streety K 04/06/2020 8:45 AM

## 2020-04-08 NOTE — Progress Notes (Signed)
[place]  [phone] [phone]) [phone]  ID: Chris George OB: Aug 10, 1956  MR#: [id number]  YIF#:[id number]  Patient Care Team: Sofie Hartigan, MD as PCP - General (Family Medicine) Lloyd Huger, MD as Consulting Physician (Oncology)  CHIEF COMPLAINT: Stage IIIc colon cancer, smoldering myeloma.  INTERVAL HISTORY: Patient returns to clinic today for further evaluation and consideration of cycle 7 of 12 of adjuvant FOLFOX.  He continues to tolerate his treatments well without significant side effects.  He currently feels well and is asymptomatic.  He continues to remain active and playing tennis throughout the week.  He has no neurologic complaints. He does not complain of weakness or fatigue today.  He has a good appetite and denies weight loss.  He has no chest pain, shortness of breath, cough, or hemoptysis.  He denies any nausea, vomiting, constipation, or diarrhea.  He has no melena or hematochezia.  He has no urinary complaints.  Patient offers no specific complaints today.  REVIEW OF SYSTEMS:   Review of Systems  Constitutional: Negative.  Negative for fever, malaise/fatigue and weight loss.  Respiratory: Negative.  Negative for cough, hemoptysis and shortness of breath.   Cardiovascular: Negative.  Negative for chest pain and leg swelling.  Gastrointestinal: Negative.  Negative for abdominal pain, blood in stool and melena.  Genitourinary: Negative.  Negative for hematuria.  Musculoskeletal: Negative.  Negative for back pain.  Skin: Negative.  Negative for rash.  Neurological: Negative.  Negative for dizziness, focal weakness, weakness and headaches.  Psychiatric/Behavioral: Negative.  The patient is not nervous/anxious.     As per HPI. Otherwise, a complete review of systems is negative.  PAST MEDICAL HISTORY: Past Medical History:  Diagnosis Date  . A-fib ([place])   . Anemia   . Arthritis    ankles  . Cancer ([place])    multiple  myeloma  . 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[place]) 10/26/2019  . Cardiomyopathy ([place])   . CHF (congestive heart failure) ([place])   . Depression   . Dysrhythmia    atrial fib  . HLD (hyperlipidemia)   . Hypercholesteremia   . Hypertension   . Moderate tricuspid insufficiency   . Multiple myeloma ([place])    not being treated right now per pt 11/11/19  . Pneumonia   . Sleep apnea    No CPAP    PAST SURGICAL HISTORY: Past Surgical History:  Procedure Laterality Date  . ATRIAL ABLATION SURGERY    . CARDIAC SURGERY     A-Fib Ablations  . COLONOSCOPY WITH PROPOFOL N/A 10/26/2019   Procedure: COLONOSCOPY WITH BIOPSY;  Surgeon: Lucilla Lame, MD;  Location: [facility];  Service: Endoscopy;  Laterality: N/A;  . ESOPHAGOGASTRODUODENOSCOPY (EGD) WITH PROPOFOL N/A 10/26/2019   Procedure: ESOPHAGOGASTRODUODENOSCOPY (EGD) WITH BIOPSY;  Surgeon: Lucilla Lame, MD;  Location: [facility];  Service: Endoscopy;  Laterality: N/A;  sleep apnea  . LAPAROSCOPIC PARTIAL COLECTOMY Right 11/17/2019   Procedure: LAPAROSCOPIC PARTIAL COLECTOMY RIGHT EXTENDED;  Surgeon: Olean Ree, MD;  Location: [facility];  Service: General;  Laterality: Right;  . PORTACATH PLACEMENT N/A 12/28/2019   Procedure: INSERTION PORT-A-CATH Left subclavian;  Surgeon: Olean Ree, MD;  Location: [facility];  Service: General;  Laterality: N/A;    FAMILY HISTORY: Family History  Problem Relation Age of Onset  . Healthy Mother     ADVANCED DIRECTIVES (Y/N):  N  HEALTH MAINTENANCE: Social History   Tobacco Use  . Smoking status: Never Smoker  . Smokeless tobacco: Never  Used  Vaping Use  . Vaping Use: Never used  Substance Use Topics  . Alcohol use: Not Currently  . Drug use: Never     Colonoscopy:  PAP:  Bone density:  Lipid panel:  No Known Allergies  Current Outpatient Medications  Medication Sig Dispense Refill  . APPLE CIDER VINEGAR PO Take 30-45 mLs by mouth every other day.     Marland Kitchen aspirin 325 MG EC  tablet Take 325 mg by mouth every other day. In the morning    . digoxin (DIGOX) 0.25 MG tablet Take 0.25 mg by mouth daily.     . furosemide (LASIX) 80 MG tablet Take 80 mg by mouth 2 (two) times daily. Morning & late afternoon    . lidocaine-prilocaine (EMLA) cream Apply to affected area once 30 g 3  . Misc Natural Products (TART CHERRY ADVANCED PO) Take 60 mLs by mouth every other day.    . potassium bicarbonate (KLOR-CON/EF) 25 MEQ disintegrating tablet Take 25 mEq by mouth 4 (four) times a week. In the afternoon.    . prochlorperazine (COMPAZINE) 10 MG tablet Take 1 tablet (10 mg total) by mouth every 6 (six) hours as needed (Nausea or vomiting). 60 tablet 2   No current facility-administered medications for this visit.    OBJECTIVE: Vitals:   04/13/20 0907  BP: 116/76  Pulse: (!) 56  Resp: 20  Temp: 97.7 F (36.5 C)  SpO2: 100%     Body mass index is 30.33 kg/m.    ECOG FS:0 - Asymptomatic  General: Well-developed, well-nourished, no acute distress. Eyes: Pink conjunctiva, anicteric sclera. HEENT: Normocephalic, moist mucous membranes. Lungs: No audible wheezing or coughing. Heart: Regular rate and rhythm. Abdomen: Soft, nontender, no obvious distention. Musculoskeletal: No edema, cyanosis, or clubbing. Neuro: Alert, answering all questions appropriately. Cranial nerves grossly intact. Skin: No rashes or petechiae noted. Psych: Normal affect.   LAB RESULTS:  Lab Results  Component Value Date   NA 142 04/13/2020   K 3.6 04/13/2020   CL 106 04/13/2020   CO2 28 04/13/2020   GLUCOSE 89 04/13/2020   BUN 14 04/13/2020   CREATININE 1.35 (H) 04/13/2020   CALCIUM 8.4 (L) 04/13/2020   PROT 6.7 04/13/2020   ALBUMIN 3.0 (L) 04/13/2020   AST 29 04/13/2020   ALT 13 04/13/2020   ALKPHOS 139 (H) 04/13/2020   BILITOT 0.7 04/13/2020   GFRNONAA 55 (L) 04/13/2020   GFRAA >60 04/13/2020    Lab Results  Component Value Date   WBC 3.2 (L) 04/13/2020   NEUTROABS 1.9  04/13/2020   HGB 9.5 (L) 04/13/2020   HCT 29.9 (L) 04/13/2020   MCV 93.4 04/13/2020   PLT 88 (L) 04/13/2020   Lab Results  Component Value Date   IRON 35 (L) 09/09/2019   TIBC 357 09/09/2019   IRONPCTSAT 10 (L) 09/09/2019   Lab Results  Component Value Date   FERRITIN 11 (L) 09/09/2019     STUDIES: No results found.  ASSESSMENT: Stage IIIc colon cancer, smoldering myeloma.  PLAN:    1.  Stage IIIc colon cancer: Imaging and final pathology reviewed independently confirming stage of disease.  Patient has agreed to adjuvant FOLFOX chemotherapy for 12 cycles. Patient has now had port placement.  Proceed with cycle 7 of FOLFOX today.  Given his persistent neutropenia, will add Udenyca with his pump removal to the remainder of his treatments.  Because of persistent thrombocytopenia, patient will now receive treatment every 3 weeks.  Return to clinic in  2 days for pump removal and Udenyca.  He will then return to clinic in 3 weeks for further evaluation and consideration of cycle 8. 2. Smoldering myeloma: Patient was initially diagnosed and treated in [place], [place] and treated with single agent Velcade in approximately 2013.  He was evaluated in clinic in July 2014 when he declined any maintenance treatment or referral for bone marrow transplant.  He was subsequently lost to follow-up.  His most recent SPEP revealed an M spike of 2.0 with an IgG predominance of 2949.  These are unchanged from 3 months prior.  He also has a suppressed IgA level.  Kappa/lambda light chain ratio has trended up slightly to 2.17.  He has mild renal insufficiency, but no other evidence of endorgan damage.  Metastatic bone survey on May 20, 2019 reviewed independently with no obvious bony lesions.  Bone marrow biopsy completed on May 28, 2019 revealed only 10 to 15% plasma cells with no clonality reported.  Cytogenetics were also reported as normal.  No intervention is needed at this time.  Continue to monitor  closely. 3.  Iron deficiency anemia: Hemoglobin is trended down to 9.5.  We will repeat iron stores at next clinic visit.  He last received IV Feraheme on October 06, 2019.   4.  Renal insufficiency: Improved.  Patient's creatinine is 1.35 today. 5.  Peripheral neuropathy: Chronic and unchanged.  Patient does not complain of this today. 6.  Neutropenia: Resolved.  Proceed with treatment today and add Udenyca with pump removal as above. 7. Thrombocytopenia: Platelet count is 88 today.  Proceed with treatment as above.  Patient will receive treatment every 3 weeks as above. 8.  Hypokalemia: Resolved.  Continue oral potassium supplementation as prescribed.  Patient expressed understanding and was in agreement with this plan. He also understands that He can call clinic at any time with any questions, concerns, or complaints.    Lloyd Huger, MD   04/14/2020 8:24 AM

## 2020-04-13 ENCOUNTER — Other Ambulatory Visit: Payer: Self-pay

## 2020-04-13 ENCOUNTER — Encounter: Payer: Self-pay | Admitting: Oncology

## 2020-04-13 ENCOUNTER — Inpatient Hospital Stay: Payer: BC Managed Care – PPO | Attending: Oncology

## 2020-04-13 ENCOUNTER — Inpatient Hospital Stay (HOSPITAL_BASED_OUTPATIENT_CLINIC_OR_DEPARTMENT_OTHER): Payer: BC Managed Care – PPO | Admitting: Oncology

## 2020-04-13 ENCOUNTER — Inpatient Hospital Stay: Payer: BC Managed Care – PPO

## 2020-04-13 VITALS — BP 116/76 | HR 56 | Temp 97.7°F | Resp 20 | Wt 205.4 lb

## 2020-04-13 DIAGNOSIS — Z5111 Encounter for antineoplastic chemotherapy: Secondary | ICD-10-CM | POA: Insufficient documentation

## 2020-04-13 DIAGNOSIS — C184 Malignant neoplasm of transverse colon: Secondary | ICD-10-CM

## 2020-04-13 DIAGNOSIS — Z5189 Encounter for other specified aftercare: Secondary | ICD-10-CM | POA: Diagnosis not present

## 2020-04-13 DIAGNOSIS — C9000 Multiple myeloma not having achieved remission: Secondary | ICD-10-CM | POA: Insufficient documentation

## 2020-04-13 LAB — COMPREHENSIVE METABOLIC PANEL
ALT: 13 U/L (ref 0–44)
AST: 29 U/L (ref 15–41)
Albumin: 3 g/dL — ABNORMAL LOW (ref 3.5–5.0)
Alkaline Phosphatase: 139 U/L — ABNORMAL HIGH (ref 38–126)
Anion gap: 8 (ref 5–15)
BUN: 14 mg/dL (ref 8–23)
CO2: 28 mmol/L (ref 22–32)
Calcium: 8.4 mg/dL — ABNORMAL LOW (ref 8.9–10.3)
Chloride: 106 mmol/L (ref 98–111)
Creatinine, Ser: 1.35 mg/dL — ABNORMAL HIGH (ref 0.61–1.24)
GFR calc Af Amer: >60 mL/min (ref >60)
GFR calc non Af Amer: 55 mL/min — ABNORMAL LOW (ref >60)
Glucose, Bld: 89 mg/dL (ref 70–99)
Potassium: 3.6 mmol/L (ref 3.5–5.1)
Sodium: 142 mmol/L (ref 135–145)
Total Bilirubin: 0.7 mg/dL (ref 0.3–1.2)
Total Protein: 6.7 g/dL (ref 6.5–8.1)

## 2020-04-13 LAB — CBC WITH DIFFERENTIAL/PLATELET
Abs Immature Granulocytes: 0.02 10*3/uL (ref 0.00–0.07)
Basophils Absolute: 0 10*3/uL (ref 0.0–0.1)
Basophils Relative: 1 %
Eosinophils Absolute: 0 10*3/uL (ref 0.0–0.5)
Eosinophils Relative: 1 %
HCT: 29.9 % — ABNORMAL LOW (ref 39.0–52.0)
Hemoglobin: 9.5 g/dL — ABNORMAL LOW (ref 13.0–17.0)
Immature Granulocytes: 1 %
Lymphocytes Relative: 18 %
Lymphs Abs: 0.6 10*3/uL — ABNORMAL LOW (ref 0.7–4.0)
MCH: 29.7 pg (ref 26.0–34.0)
MCHC: 31.8 g/dL (ref 30.0–36.0)
MCV: 93.4 fL (ref 80.0–100.0)
Monocytes Absolute: 0.7 10*3/uL (ref 0.1–1.0)
Monocytes Relative: 22 %
Neutro Abs: 1.9 10*3/uL (ref 1.7–7.7)
Neutrophils Relative %: 57 %
Platelets: 88 10*3/uL — ABNORMAL LOW (ref 150–400)
RBC: 3.2 MIL/uL — ABNORMAL LOW (ref 4.22–5.81)
RDW: 18.5 % — ABNORMAL HIGH (ref 11.5–15.5)
WBC: 3.2 10*3/uL — ABNORMAL LOW (ref 4.0–10.5)
nRBC: 0 % (ref 0.0–0.2)

## 2020-04-13 MED ORDER — DEXTROSE 5 % IV SOLN
Freq: Once | INTRAVENOUS | Status: AC
  Filled 2020-04-13: qty 250

## 2020-04-13 MED ORDER — LEUCOVORIN CALCIUM INJECTION 350 MG
395.0000 mg/m2 | Freq: Once | INTRAVENOUS | Status: AC
  Administered 2020-04-13: 850 mg via INTRAVENOUS
  Filled 2020-04-13: qty 25

## 2020-04-13 MED ORDER — SODIUM CHLORIDE 0.9 % IV SOLN
2400.0000 mg/m2 | INTRAVENOUS | Status: DC
  Administered 2020-04-13: 5150 mg via INTRAVENOUS
  Filled 2020-04-13: qty 103

## 2020-04-13 MED ORDER — FLUOROURACIL CHEMO INJECTION 2.5 GM/50ML
400.0000 mg/m2 | Freq: Once | INTRAVENOUS | Status: AC
  Administered 2020-04-13: 850 mg via INTRAVENOUS
  Filled 2020-04-13: qty 17

## 2020-04-13 MED ORDER — OXALIPLATIN CHEMO INJECTION 100 MG/20ML
85.0000 mg/m2 | Freq: Once | INTRAVENOUS | Status: AC
  Administered 2020-04-13: 185 mg via INTRAVENOUS
  Filled 2020-04-13: qty 37

## 2020-04-13 MED ORDER — PALONOSETRON HCL INJECTION 0.25 MG/5ML
0.2500 mg | Freq: Once | INTRAVENOUS | Status: AC
  Administered 2020-04-13: 0.25 mg via INTRAVENOUS
  Filled 2020-04-13: qty 5

## 2020-04-13 MED ORDER — SODIUM CHLORIDE 0.9 % IV SOLN
10.0000 mg | Freq: Once | INTRAVENOUS | Status: AC
  Administered 2020-04-13: 10 mg via INTRAVENOUS
  Filled 2020-04-13: qty 10

## 2020-04-15 ENCOUNTER — Other Ambulatory Visit: Payer: Self-pay

## 2020-04-15 ENCOUNTER — Inpatient Hospital Stay: Payer: BC Managed Care – PPO

## 2020-04-15 DIAGNOSIS — Z5111 Encounter for antineoplastic chemotherapy: Secondary | ICD-10-CM | POA: Diagnosis not present

## 2020-04-15 DIAGNOSIS — C184 Malignant neoplasm of transverse colon: Secondary | ICD-10-CM

## 2020-04-15 MED ORDER — HEPARIN SOD (PORK) LOCK FLUSH 100 UNIT/ML IV SOLN
INTRAVENOUS | Status: AC
  Filled 2020-04-15: qty 5

## 2020-04-15 MED ORDER — PEGFILGRASTIM-CBQV 6 MG/0.6ML ~~LOC~~ SOSY
6.0000 mg | PREFILLED_SYRINGE | Freq: Once | SUBCUTANEOUS | Status: AC
  Administered 2020-04-15: 6 mg via SUBCUTANEOUS
  Filled 2020-04-15: qty 0.6

## 2020-04-15 MED ORDER — SODIUM CHLORIDE 0.9% FLUSH
10.0000 mL | INTRAVENOUS | Status: DC | PRN
  Administered 2020-04-15: 10 mL
  Filled 2020-04-15: qty 10

## 2020-04-15 MED ORDER — HEPARIN SOD (PORK) LOCK FLUSH 100 UNIT/ML IV SOLN
500.0000 [IU] | Freq: Once | INTRAVENOUS | Status: AC | PRN
  Administered 2020-04-15: 500 [IU]
  Filled 2020-04-15: qty 5

## 2020-04-28 NOTE — Progress Notes (Deleted)
[place]  [phone]) [phone] [phone]) [phone]  ID: PINCHAS REITHER OB: 30-Jul-1956  MR#: [id number]  HDQ#:[id number]  Patient Care Team: Sofie Hartigan, MD as PCP - General (Family Medicine) Lloyd Huger, MD as Consulting Physician (Oncology)  CHIEF COMPLAINT: Stage IIIc colon cancer, smoldering myeloma.  INTERVAL HISTORY: Patient returns to clinic today for further evaluation and consideration of cycle 7 of 12 of adjuvant FOLFOX.  He continues to tolerate his treatments well without significant side effects.  He currently feels well and is asymptomatic.  He continues to remain active and playing tennis throughout the week.  He has no neurologic complaints. He does not complain of weakness or fatigue today.  He has a good appetite and denies weight loss.  He has no chest pain, shortness of breath, cough, or hemoptysis.  He denies any nausea, vomiting, constipation, or diarrhea.  He has no melena or hematochezia.  He has no urinary complaints.  Patient offers no specific complaints today.  REVIEW OF SYSTEMS:   Review of Systems  Constitutional: Negative.  Negative for fever, malaise/fatigue and weight loss.  Respiratory: Negative.  Negative for cough, hemoptysis and shortness of breath.   Cardiovascular: Negative.  Negative for chest pain and leg swelling.  Gastrointestinal: Negative.  Negative for abdominal pain, blood in stool and melena.  Genitourinary: Negative.  Negative for hematuria.  Musculoskeletal: Negative.  Negative for back pain.  Skin: Negative.  Negative for rash.  Neurological: Negative.  Negative for dizziness, focal weakness, weakness and headaches.  Psychiatric/Behavioral: Negative.  The patient is not nervous/anxious.     As per HPI. Otherwise, a complete review of systems is negative.  PAST MEDICAL HISTORY: Past Medical History:  Diagnosis Date  . A-fib ([place])   . Anemia   . Arthritis    ankles  . Cancer ([place])    multiple  myeloma  . 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[place]) 10/26/2019  . Cardiomyopathy ([place])   . CHF (congestive heart failure) ([place])   . Depression   . Dysrhythmia    atrial fib  . HLD (hyperlipidemia)   . Hypercholesteremia   . Hypertension   . Moderate tricuspid insufficiency   . Multiple myeloma ([place])    not being treated right now per pt 11/11/19  . Pneumonia   . Sleep apnea    No CPAP    PAST SURGICAL HISTORY: Past Surgical History:  Procedure Laterality Date  . ATRIAL ABLATION SURGERY    . CARDIAC SURGERY     A-Fib Ablations  . COLONOSCOPY WITH PROPOFOL N/A 10/26/2019   Procedure: COLONOSCOPY WITH BIOPSY;  Surgeon: Lucilla Lame, MD;  Location: [facility];  Service: Endoscopy;  Laterality: N/A;  . ESOPHAGOGASTRODUODENOSCOPY (EGD) WITH PROPOFOL N/A 10/26/2019   Procedure: ESOPHAGOGASTRODUODENOSCOPY (EGD) WITH BIOPSY;  Surgeon: Lucilla Lame, MD;  Location: [facility];  Service: Endoscopy;  Laterality: N/A;  sleep apnea  . LAPAROSCOPIC PARTIAL COLECTOMY Right 11/17/2019   Procedure: LAPAROSCOPIC PARTIAL COLECTOMY RIGHT EXTENDED;  Surgeon: Olean Ree, MD;  Location: [facility];  Service: General;  Laterality: Right;  . PORTACATH PLACEMENT N/A 12/28/2019   Procedure: INSERTION PORT-A-CATH Left subclavian;  Surgeon: Olean Ree, MD;  Location: [facility];  Service: General;  Laterality: N/A;    FAMILY HISTORY: Family History  Problem Relation Age of Onset  . Healthy Mother     ADVANCED DIRECTIVES (Y/N):  N  HEALTH MAINTENANCE: Social History   Tobacco Use  . Smoking status: Never Smoker  . Smokeless tobacco: Never  Used  Vaping Use  . Vaping Use: Never used  Substance Use Topics  . Alcohol use: Not Currently  . Drug use: Never     Colonoscopy:  PAP:  Bone density:  Lipid panel:  No Known Allergies  Current Outpatient Medications  Medication Sig Dispense Refill  . APPLE CIDER VINEGAR PO Take 30-45 mLs by mouth every other day.     Marland Kitchen aspirin 325 MG EC  tablet Take 325 mg by mouth every other day. In the morning    . digoxin (DIGOX) 0.25 MG tablet Take 0.25 mg by mouth daily.     . furosemide (LASIX) 80 MG tablet Take 80 mg by mouth 2 (two) times daily. Morning & late afternoon    . lidocaine-prilocaine (EMLA) cream Apply to affected area once 30 g 3  . Misc Natural Products (TART CHERRY ADVANCED PO) Take 60 mLs by mouth every other day.    . potassium bicarbonate (KLOR-CON/EF) 25 MEQ disintegrating tablet Take 25 mEq by mouth 4 (four) times a week. In the afternoon.    . prochlorperazine (COMPAZINE) 10 MG tablet Take 1 tablet (10 mg total) by mouth every 6 (six) hours as needed (Nausea or vomiting). 60 tablet 2   No current facility-administered medications for this visit.    OBJECTIVE: There were no vitals filed for this visit.   There is no height or weight on file to calculate BMI.    ECOG FS:0 - Asymptomatic  General: Well-developed, well-nourished, no acute distress. Eyes: Pink conjunctiva, anicteric sclera. HEENT: Normocephalic, moist mucous membranes. Lungs: No audible wheezing or coughing. Heart: Regular rate and rhythm. Abdomen: Soft, nontender, no obvious distention. Musculoskeletal: No edema, cyanosis, or clubbing. Neuro: Alert, answering all questions appropriately. Cranial nerves grossly intact. Skin: No rashes or petechiae noted. Psych: Normal affect.   LAB RESULTS:  Lab Results  Component Value Date   NA 142 04/13/2020   K 3.6 04/13/2020   CL 106 04/13/2020   CO2 28 04/13/2020   GLUCOSE 89 04/13/2020   BUN 14 04/13/2020   CREATININE 1.35 (H) 04/13/2020   CALCIUM 8.4 (L) 04/13/2020   PROT 6.7 04/13/2020   ALBUMIN 3.0 (L) 04/13/2020   AST 29 04/13/2020   ALT 13 04/13/2020   ALKPHOS 139 (H) 04/13/2020   BILITOT 0.7 04/13/2020   GFRNONAA 55 (L) 04/13/2020   GFRAA >60 04/13/2020    Lab Results  Component Value Date   WBC 3.2 (L) 04/13/2020   NEUTROABS 1.9 04/13/2020   HGB 9.5 (L) 04/13/2020   HCT 29.9  (L) 04/13/2020   MCV 93.4 04/13/2020   PLT 88 (L) 04/13/2020   Lab Results  Component Value Date   IRON 35 (L) 09/09/2019   TIBC 357 09/09/2019   IRONPCTSAT 10 (L) 09/09/2019   Lab Results  Component Value Date   FERRITIN 11 (L) 09/09/2019     STUDIES: No results found.  ASSESSMENT: Stage IIIc colon cancer, smoldering myeloma.  PLAN:    1.  Stage IIIc colon cancer: Imaging and final pathology reviewed independently confirming stage of disease.  Patient has agreed to adjuvant FOLFOX chemotherapy for 12 cycles. Patient has now had port placement.  Proceed with cycle 7 of FOLFOX today.  Given his persistent neutropenia, will add Udenyca with his pump removal to the remainder of his treatments.  Because of persistent thrombocytopenia, patient will now receive treatment every 3 weeks.  Return to clinic in 2 days for pump removal and Udenyca.  He will then return to  clinic in 3 weeks for further evaluation and consideration of cycle 8. 2. Smoldering myeloma: Patient was initially diagnosed and treated in [place], [place] and treated with single agent Velcade in approximately 2013.  He was evaluated in clinic in July 2014 when he declined any maintenance treatment or referral for bone marrow transplant.  He was subsequently lost to follow-up.  His most recent SPEP revealed an M spike of 2.0 with an IgG predominance of 2949.  These are unchanged from 3 months prior.  He also has a suppressed IgA level.  Kappa/lambda light chain ratio has trended up slightly to 2.17.  He has mild renal insufficiency, but no other evidence of endorgan damage.  Metastatic bone survey on May 20, 2019 reviewed independently with no obvious bony lesions.  Bone marrow biopsy completed on May 28, 2019 revealed only 10 to 15% plasma cells with no clonality reported.  Cytogenetics were also reported as normal.  No intervention is needed at this time.  Continue to monitor closely. 3.  Iron deficiency anemia: Hemoglobin is  trended down to 9.5.  We will repeat iron stores at next clinic visit.  He last received IV Feraheme on October 06, 2019.   4.  Renal insufficiency: Improved.  Patient's creatinine is 1.35 today. 5.  Peripheral neuropathy: Chronic and unchanged.  Patient does not complain of this today. 6.  Neutropenia: Resolved.  Proceed with treatment today and add Udenyca with pump removal as above. 7. Thrombocytopenia: Platelet count is 88 today.  Proceed with treatment as above.  Patient will receive treatment every 3 weeks as above. 8.  Hypokalemia: Resolved.  Continue oral potassium supplementation as prescribed.  Patient expressed understanding and was in agreement with this plan. He also understands that He can call clinic at any time with any questions, concerns, or complaints.    Lloyd Huger, MD   04/28/2020 7:28 AM

## 2020-04-29 ENCOUNTER — Telehealth: Payer: Self-pay | Admitting: Oncology

## 2020-04-29 NOTE — Telephone Encounter (Signed)
Patient phoned on this date and rescheduled his appts as he was going out of town and would not be able to complete his treatments. Appts rescheduled per patient's request.

## 2020-05-04 ENCOUNTER — Inpatient Hospital Stay: Payer: BC Managed Care – PPO

## 2020-05-04 ENCOUNTER — Inpatient Hospital Stay: Payer: BC Managed Care – PPO | Admitting: Oncology

## 2020-05-06 ENCOUNTER — Inpatient Hospital Stay: Payer: BC Managed Care – PPO

## 2020-05-08 ENCOUNTER — Other Ambulatory Visit: Payer: Self-pay | Admitting: Oncology

## 2020-05-08 NOTE — Progress Notes (Signed)
[facility]  [phone] [phone]) [phone]  ID: SAMIE REASONS OB: [birth year]  MR#: [id number]  FFM#:[id number]  Patient Care Team: Sofie Hartigan, MD as PCP - General (Family Medicine) Lloyd Huger, MD as Consulting Physician (Oncology)  CHIEF COMPLAINT: Stage IIIc colon cancer, smoldering myeloma.  INTERVAL HISTORY: Patient returns to clinic today for further evaluation and consideration of cycle 8 of 12 of adjuvant FOLFOX.  Treatment was delayed 1 week since patient was out of town for family matters.  He continues to feel well and remains asymptomatic.  He is tolerating his treatments without significant side effects.  He remains active and playing tennis throughout the week.  He has no neurologic complaints. He does not complain of weakness or fatigue today.  He has a good appetite and denies weight loss.  He has no chest pain, shortness of breath, cough, or hemoptysis.  He denies any nausea, vomiting, constipation, or diarrhea.  He has no melena or hematochezia.  He has no urinary complaints.  Patient offers no specific complaints today.  REVIEW OF SYSTEMS:   Review of Systems  Constitutional: Negative.  Negative for fever, malaise/fatigue and weight loss.  Respiratory: Negative.  Negative for cough, hemoptysis and shortness of breath.   Cardiovascular: Negative.  Negative for chest pain and leg swelling.  Gastrointestinal: Negative.  Negative for abdominal pain, blood in stool and melena.  Genitourinary: Negative.  Negative for hematuria.  Musculoskeletal: Negative.  Negative for back pain.  Skin: Negative.  Negative for rash.  Neurological: Negative.  Negative for dizziness, focal weakness, weakness and headaches.  Psychiatric/Behavioral: Negative.  The patient is not nervous/anxious.     As per HPI. Otherwise, a complete review of systems is negative.  PAST MEDICAL HISTORY: Past Medical History:  Diagnosis Date  . A-fib ([place])   .  Anemia   . Arthritis    ankles  . Cancer ([place])    multiple myeloma  . 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[place]) 10/26/2019  . Cardiomyopathy ([place])   . CHF (congestive heart failure) ([place])   . Depression   . Dysrhythmia    atrial fib  . HLD (hyperlipidemia)   . Hypercholesteremia   . Hypertension   . Moderate tricuspid insufficiency   . Multiple myeloma ([place])    not being treated right now per pt 11/11/19  . Pneumonia   . Sleep apnea    No CPAP    PAST SURGICAL HISTORY: Past Surgical History:  Procedure Laterality Date  . ATRIAL ABLATION SURGERY    . CARDIAC SURGERY     A-Fib Ablations  . COLONOSCOPY WITH PROPOFOL N/A 10/26/2019   Procedure: COLONOSCOPY WITH BIOPSY;  Surgeon: Lucilla Lame, MD;  Location: [facility];  Service: Endoscopy;  Laterality: N/A;  . ESOPHAGOGASTRODUODENOSCOPY (EGD) WITH PROPOFOL N/A 10/26/2019   Procedure: ESOPHAGOGASTRODUODENOSCOPY (EGD) WITH BIOPSY;  Surgeon: Lucilla Lame, MD;  Location: [facility];  Service: Endoscopy;  Laterality: N/A;  sleep apnea  . LAPAROSCOPIC PARTIAL COLECTOMY Right 11/17/2019   Procedure: LAPAROSCOPIC PARTIAL COLECTOMY RIGHT EXTENDED;  Surgeon: Olean Ree, MD;  Location: [facility];  Service: General;  Laterality: Right;  . PORTACATH PLACEMENT N/A 12/28/2019   Procedure: INSERTION PORT-A-CATH Left subclavian;  Surgeon: Olean Ree, MD;  Location: [facility];  Service: General;  Laterality: N/A;    FAMILY HISTORY: Family History  Problem Relation Age of Onset  . Healthy Mother     ADVANCED DIRECTIVES (Y/N):  N  HEALTH MAINTENANCE: Social History   Tobacco  Use  . Smoking status: Never Smoker  . Smokeless tobacco: Never Used  Vaping Use  . Vaping Use: Never used  Substance Use Topics  . Alcohol use: Not Currently  . Drug use: Never     Colonoscopy:  PAP:  Bone density:  Lipid panel:  No Known Allergies  Current Outpatient Medications  Medication Sig Dispense Refill  . APPLE CIDER VINEGAR PO Take  30-45 mLs by mouth every other day.     Marland Kitchen aspirin 325 MG EC tablet Take 325 mg by mouth every other day. In the morning    . digoxin (DIGOX) 0.25 MG tablet Take 0.25 mg by mouth daily.     . furosemide (LASIX) 80 MG tablet Take 80 mg by mouth 2 (two) times daily. Morning & late afternoon    . lidocaine-prilocaine (EMLA) cream Apply to affected area once 30 g 3  . Misc Natural Products (TART CHERRY ADVANCED PO) Take 60 mLs by mouth every other day.    . potassium bicarbonate (KLOR-CON/EF) 25 MEQ disintegrating tablet Take 25 mEq by mouth 4 (four) times a week. In the afternoon.    . prochlorperazine (COMPAZINE) 10 MG tablet Take 1 tablet (10 mg total) by mouth every 6 (six) hours as needed (Nausea or vomiting). 60 tablet 2   No current facility-administered medications for this visit.   Facility-Administered Medications Ordered in Other Visits  Medication Dose Route Frequency Provider Last Rate Last Admin  . fluorouracil (ADRUCIL) 5,150 mg in sodium chloride 0.9 % 147 mL chemo infusion  2,400 mg/m2 (Treatment Plan Recorded) Intravenous 1 day or 1 dose [name], MD   5,150 mg at 05/11/20 1310    OBJECTIVE: Vitals:   05/10/20 1339  BP: 105/64  Pulse: (!) 54  Resp: 20  Temp: 97.6 F (36.4 C)  SpO2: 99%     Body mass index is 30.39 kg/m.    ECOG FS:0 - Asymptomatic  General: Well-developed, well-nourished, no acute distress. Eyes: Pink conjunctiva, anicteric sclera. HEENT: Normocephalic, moist mucous membranes. Lungs: No audible wheezing or coughing. Heart: Regular rate and rhythm. Abdomen: Soft, nontender, no obvious distention. Musculoskeletal: No edema, cyanosis, or clubbing. Neuro: Alert, answering all questions appropriately. Cranial nerves grossly intact. Skin: No rashes or petechiae noted. Psych: Normal affect.  LAB RESULTS:  Lab Results  Component Value Date   NA 140 05/11/2020   K 3.3 (L) 05/11/2020   CL 103 05/11/2020   CO2 29 05/11/2020   GLUCOSE 96  05/11/2020   BUN 21 05/11/2020   CREATININE 1.42 (H) 05/11/2020   CALCIUM 7.9 (L) 05/11/2020   PROT 6.6 05/11/2020   ALBUMIN 2.8 (L) 05/11/2020   AST 42 (H) 05/11/2020   ALT 19 05/11/2020   ALKPHOS 165 (H) 05/11/2020   BILITOT 0.8 05/11/2020   GFRNONAA 52 (L) 05/11/2020   GFRAA >60 05/11/2020    Lab Results  Component Value Date   WBC 2.4 (L) 05/11/2020   NEUTROABS 1.2 (L) 05/11/2020   HGB 9.7 (L) 05/11/2020   HCT 30.0 (L) 05/11/2020   MCV 93.5 05/11/2020   PLT 77 (L) 05/11/2020   Lab Results  Component Value Date   IRON 41 (L) 05/11/2020   TIBC 323 05/11/2020   IRONPCTSAT 13 (L) 05/11/2020   Lab Results  Component Value Date   FERRITIN 86 05/11/2020     STUDIES: No results found.  ASSESSMENT: Stage IIIc colon cancer, smoldering myeloma.  PLAN:    1.  Stage IIIc colon cancer: Imaging and  final pathology reviewed independently confirming stage of disease.  Patient has agreed to adjuvant FOLFOX chemotherapy for 12 cycles. Patient has now had port placement.  Proceed with cycle 8 of FOLFOX today.  Given his persistent neutropenia, will add Udenyca with his pump removal to the remainder of his treatments.  Because of persistent thrombocytopenia, patient will now receive treatment every 3 weeks.  Return to clinic in 2 days for pump removal and Udenyca.  Patient will then return to clinic in 3 weeks for further evaluation and consideration of cycle 9. 2. Smoldering myeloma: Patient was initially diagnosed and treated in [place], [place] and treated with single agent Velcade in approximately 2013.  He was evaluated in clinic in July 2014 when he declined any maintenance treatment or referral for bone marrow transplant.  He was subsequently lost to follow-up.  His most recent SPEP revealed an M spike of 2.0 with an IgG predominance of 2949.  These are unchanged from 3 months prior.  He also has a suppressed IgA level.  Kappa/lambda light chain ratio has trended up slightly to  2.17.  He has mild renal insufficiency, but no other evidence of endorgan damage.  Metastatic bone survey on May 20, 2019 reviewed independently with no obvious bony lesions.  Bone marrow biopsy completed on May 28, 2019 revealed only 10 to 15% plasma cells with no clonality reported.  Cytogenetics were also reported as normal.  No intervention is needed at this time.  Continue to monitor closely. 3.  Iron deficiency anemia: Chronic and unchanged.  Patient's hemoglobin is 9.7 today.  He has mildly decreased total iron and iron saturation.  Monitor.  He last received IV Feraheme on October 06, 2019.   4.  Renal insufficiency: Chronic and unchanged.  Patient's creatinine is 1.42 today. 5.  Peripheral neuropathy: Chronic and unchanged.  Patient does not complain of this today. 6.  Neutropenia: Resolved.  Proceed with treatment today and add Udenyca with pump removal as above. 7. Thrombocytopenia: Platelet count 77 today.  We will continue to treat as long as platelets remain above 75.  Treatment every 3 weeks as above. 8.  Hypokalemia: Mild.  Continue oral potassium supplementation as prescribed.  Patient expressed understanding and was in agreement with this plan. He also understands that He can call clinic at any time with any questions, concerns, or complaints.    Lloyd Huger, MD   05/11/2020 1:59 PM

## 2020-05-10 ENCOUNTER — Encounter: Payer: Self-pay | Admitting: Oncology

## 2020-05-10 NOTE — Progress Notes (Signed)
Patient called for pre assessment. He denies any pain or concerns at this time.  

## 2020-05-11 ENCOUNTER — Encounter: Payer: Self-pay | Admitting: Oncology

## 2020-05-11 ENCOUNTER — Inpatient Hospital Stay (HOSPITAL_BASED_OUTPATIENT_CLINIC_OR_DEPARTMENT_OTHER): Payer: BC Managed Care – PPO | Admitting: Oncology

## 2020-05-11 ENCOUNTER — Inpatient Hospital Stay: Payer: BC Managed Care – PPO | Attending: Oncology

## 2020-05-11 ENCOUNTER — Inpatient Hospital Stay: Payer: BC Managed Care – PPO

## 2020-05-11 ENCOUNTER — Other Ambulatory Visit: Payer: Self-pay

## 2020-05-11 VITALS — BP 105/64 | HR 54 | Temp 97.6°F | Resp 20 | Wt 205.8 lb

## 2020-05-11 VITALS — BP 105/64 | HR 54 | Temp 97.6°F | Resp 20 | Wt 205.5 lb

## 2020-05-11 DIAGNOSIS — Z95828 Presence of other vascular implants and grafts: Secondary | ICD-10-CM

## 2020-05-11 DIAGNOSIS — Z5189 Encounter for other specified aftercare: Secondary | ICD-10-CM | POA: Insufficient documentation

## 2020-05-11 DIAGNOSIS — Z5111 Encounter for antineoplastic chemotherapy: Secondary | ICD-10-CM | POA: Diagnosis present

## 2020-05-11 DIAGNOSIS — C189 Malignant neoplasm of colon, unspecified: Secondary | ICD-10-CM | POA: Insufficient documentation

## 2020-05-11 DIAGNOSIS — C184 Malignant neoplasm of transverse colon: Secondary | ICD-10-CM

## 2020-05-11 LAB — COMPREHENSIVE METABOLIC PANEL
ALT: 19 U/L (ref 0–44)
AST: 42 U/L — ABNORMAL HIGH (ref 15–41)
Albumin: 2.8 g/dL — ABNORMAL LOW (ref 3.5–5.0)
Alkaline Phosphatase: 165 U/L — ABNORMAL HIGH (ref 38–126)
Anion gap: 8 (ref 5–15)
BUN: 21 mg/dL (ref 8–23)
CO2: 29 mmol/L (ref 22–32)
Calcium: 7.9 mg/dL — ABNORMAL LOW (ref 8.9–10.3)
Chloride: 103 mmol/L (ref 98–111)
Creatinine, Ser: 1.42 mg/dL — ABNORMAL HIGH (ref 0.61–1.24)
GFR calc Af Amer: >60 mL/min (ref >60)
GFR calc non Af Amer: 52 mL/min — ABNORMAL LOW (ref >60)
Glucose, Bld: 96 mg/dL (ref 70–99)
Potassium: 3.3 mmol/L — ABNORMAL LOW (ref 3.5–5.1)
Sodium: 140 mmol/L (ref 135–145)
Total Bilirubin: 0.8 mg/dL (ref 0.3–1.2)
Total Protein: 6.6 g/dL (ref 6.5–8.1)

## 2020-05-11 LAB — CBC WITH DIFFERENTIAL/PLATELET
Abs Immature Granulocytes: 0 10*3/uL (ref 0.00–0.07)
Basophils Absolute: 0 10*3/uL (ref 0.0–0.1)
Basophils Relative: 0 %
Eosinophils Absolute: 0 10*3/uL (ref 0.0–0.5)
Eosinophils Relative: 2 %
HCT: 30 % — ABNORMAL LOW (ref 39.0–52.0)
Hemoglobin: 9.7 g/dL — ABNORMAL LOW (ref 13.0–17.0)
Immature Granulocytes: 0 %
Lymphocytes Relative: 19 %
Lymphs Abs: 0.5 10*3/uL — ABNORMAL LOW (ref 0.7–4.0)
MCH: 30.2 pg (ref 26.0–34.0)
MCHC: 32.3 g/dL (ref 30.0–36.0)
MCV: 93.5 fL (ref 80.0–100.0)
Monocytes Absolute: 0.7 10*3/uL (ref 0.1–1.0)
Monocytes Relative: 27 %
Neutro Abs: 1.2 10*3/uL — ABNORMAL LOW (ref 1.7–7.7)
Neutrophils Relative %: 52 %
Platelets: 77 10*3/uL — ABNORMAL LOW (ref 150–400)
RBC: 3.21 MIL/uL — ABNORMAL LOW (ref 4.22–5.81)
RDW: 15.9 % — ABNORMAL HIGH (ref 11.5–15.5)
WBC: 2.4 10*3/uL — ABNORMAL LOW (ref 4.0–10.5)
nRBC: 0 % (ref 0.0–0.2)

## 2020-05-11 LAB — IRON AND TIBC
Iron: 41 ug/dL — ABNORMAL LOW (ref 45–182)
Saturation Ratios: 13 % — ABNORMAL LOW (ref 17.9–39.5)
TIBC: 323 ug/dL (ref 250–450)
UIBC: 282 ug/dL

## 2020-05-11 LAB — FERRITIN: Ferritin: 86 ng/mL (ref 24–336)

## 2020-05-11 MED ORDER — SODIUM CHLORIDE 0.9 % IV SOLN
2400.0000 mg/m2 | INTRAVENOUS | Status: DC
  Administered 2020-05-11: 5150 mg via INTRAVENOUS
  Filled 2020-05-11: qty 103

## 2020-05-11 MED ORDER — OXALIPLATIN CHEMO INJECTION 100 MG/20ML
85.0000 mg/m2 | Freq: Once | INTRAVENOUS | Status: AC
  Administered 2020-05-11: 185 mg via INTRAVENOUS
  Filled 2020-05-11: qty 37

## 2020-05-11 MED ORDER — SODIUM CHLORIDE 0.9 % IV SOLN
10.0000 mg | Freq: Once | INTRAVENOUS | Status: AC
  Administered 2020-05-11: 10 mg via INTRAVENOUS
  Filled 2020-05-11: qty 10

## 2020-05-11 MED ORDER — SODIUM CHLORIDE 0.9% FLUSH
10.0000 mL | INTRAVENOUS | Status: DC | PRN
  Administered 2020-05-11: 10 mL via INTRAVENOUS
  Filled 2020-05-11: qty 10

## 2020-05-11 MED ORDER — LEUCOVORIN CALCIUM INJECTION 350 MG
850.0000 mg | Freq: Once | INTRAVENOUS | Status: AC
  Administered 2020-05-11: 850 mg via INTRAVENOUS
  Filled 2020-05-11: qty 25

## 2020-05-11 MED ORDER — PALONOSETRON HCL INJECTION 0.25 MG/5ML
0.2500 mg | Freq: Once | INTRAVENOUS | Status: AC
  Administered 2020-05-11: 0.25 mg via INTRAVENOUS
  Filled 2020-05-11: qty 5

## 2020-05-11 MED ORDER — DEXTROSE 5 % IV SOLN
Freq: Once | INTRAVENOUS | Status: AC
  Filled 2020-05-11: qty 250

## 2020-05-11 MED ORDER — FLUOROURACIL CHEMO INJECTION 2.5 GM/50ML
400.0000 mg/m2 | Freq: Once | INTRAVENOUS | Status: AC
  Administered 2020-05-11: 850 mg via INTRAVENOUS
  Filled 2020-05-11: qty 17

## 2020-05-13 ENCOUNTER — Inpatient Hospital Stay: Payer: BC Managed Care – PPO

## 2020-05-13 ENCOUNTER — Other Ambulatory Visit: Payer: Self-pay

## 2020-05-13 VITALS — BP 124/62 | HR 69 | Temp 97.3°F | Resp 20

## 2020-05-13 DIAGNOSIS — C184 Malignant neoplasm of transverse colon: Secondary | ICD-10-CM

## 2020-05-13 DIAGNOSIS — Z5111 Encounter for antineoplastic chemotherapy: Secondary | ICD-10-CM | POA: Diagnosis not present

## 2020-05-13 MED ORDER — HEPARIN SOD (PORK) LOCK FLUSH 100 UNIT/ML IV SOLN
500.0000 [IU] | Freq: Once | INTRAVENOUS | Status: AC | PRN
  Administered 2020-05-13: 500 [IU]
  Filled 2020-05-13: qty 5

## 2020-05-13 MED ORDER — SODIUM CHLORIDE 0.9% FLUSH
10.0000 mL | INTRAVENOUS | Status: DC | PRN
  Administered 2020-05-13: 10 mL
  Filled 2020-05-13: qty 10

## 2020-05-13 MED ORDER — PEGFILGRASTIM-CBQV 6 MG/0.6ML ~~LOC~~ SOSY
6.0000 mg | PREFILLED_SYRINGE | Freq: Once | SUBCUTANEOUS | Status: AC
  Administered 2020-05-13: 6 mg via SUBCUTANEOUS
  Filled 2020-05-13: qty 0.6

## 2020-05-13 MED ORDER — HEPARIN SOD (PORK) LOCK FLUSH 100 UNIT/ML IV SOLN
INTRAVENOUS | Status: AC
  Filled 2020-05-13: qty 5

## 2020-05-13 NOTE — Progress Notes (Signed)
Patient here for pump disconnect. Patient reports feeling very tired and weak. States he hasn't felt this tired before. VS stable, no pain or SOB. Dr Grayland Ormond made aware and patient instructed to call MD on call if fever develops and to follow up with [facility] if no improvement by Monday or Tuesday of next week. Patient verbalized understanding

## 2020-05-16 ENCOUNTER — Encounter: Payer: Self-pay | Admitting: Emergency Medicine

## 2020-05-16 ENCOUNTER — Telehealth: Payer: Self-pay | Admitting: *Deleted

## 2020-05-16 NOTE — Telephone Encounter (Signed)
Call returned to patient and informed that letter has been uploaded to his MyChart acct. He thanked me for letting him know

## 2020-05-16 NOTE — Telephone Encounter (Signed)
Patient called reporting that he was too sick to work today and the day after his treatment and that he needs a note to return to work that it is alright for him to work. He thinks he will go back tomorrow

## 2020-05-16 NOTE — Telephone Encounter (Signed)
Work note has been uploaded to his [employer]

## 2020-05-30 NOTE — Progress Notes (Signed)
[facility]  Telephone:(336) [phone] Fax:(336) [phone]  ID: Chris George OB: Aug 30, 1956  MR#: [id number]  HQI#:[id number]  Patient Care Team: Sofie Hartigan, MD as PCP - General (Family Medicine) Lloyd Huger, MD as Consulting Physician (Oncology)  CHIEF COMPLAINT: Stage IIIc colon cancer, smoldering myeloma.  INTERVAL HISTORY: Patient returns to clinic today for further evaluation and consideration of cycle 9 of 12 of adjuvant FOLFOX.  He continues to feel well and remains asymptomatic.  He is tolerating his treatments without significant side effects.  He remains active and playing tennis throughout the week.  He has no neurologic complaints. He does not complain of weakness or fatigue today.  He has a good appetite and denies weight loss.  He has no chest pain, shortness of breath, cough, or hemoptysis.  He denies any nausea, vomiting, constipation, or diarrhea.  He has no melena or hematochezia.  He has no urinary complaints.  Patient offers no specific complaints today.  REVIEW OF SYSTEMS:   Review of Systems  Constitutional: Negative.  Negative for fever, malaise/fatigue and weight loss.  Respiratory: Negative.  Negative for cough, hemoptysis and shortness of breath.   Cardiovascular: Negative.  Negative for chest pain and leg swelling.  Gastrointestinal: Negative.  Negative for abdominal pain, blood in stool and melena.  Genitourinary: Negative.  Negative for hematuria.  Musculoskeletal: Negative.  Negative for back pain.  Skin: Negative.  Negative for rash.  Neurological: Negative.  Negative for dizziness, focal weakness, weakness and headaches.  Psychiatric/Behavioral: Negative.  The patient is not nervous/anxious.     As per HPI. Otherwise, a complete review of systems is negative.  PAST MEDICAL HISTORY: Past Medical History:  Diagnosis Date  . A-fib ([place])   . Anemia   . Arthritis    ankles  . Cancer ([place])    multiple myeloma  . Cancer  of transverse colon ([place]) 10/26/2019  . Cardiomyopathy ([place])   . CHF (congestive heart failure) ([place])   . Depression   . Dysrhythmia    atrial fib  . HLD (hyperlipidemia)   . Hypercholesteremia   . Hypertension   . Moderate tricuspid insufficiency   . Multiple myeloma ([place])    not being treated right now per pt 11/11/19  . Pneumonia   . Sleep apnea    No CPAP    PAST SURGICAL HISTORY: Past Surgical History:  Procedure Laterality Date  . ATRIAL ABLATION SURGERY    . CARDIAC SURGERY     A-Fib Ablations  . COLONOSCOPY WITH PROPOFOL N/A 10/26/2019   Procedure: COLONOSCOPY WITH BIOPSY;  Surgeon: Lucilla Lame, MD;  Location: [facility];  Service: Endoscopy;  Laterality: N/A;  . ESOPHAGOGASTRODUODENOSCOPY (EGD) WITH PROPOFOL N/A 10/26/2019   Procedure: ESOPHAGOGASTRODUODENOSCOPY (EGD) WITH BIOPSY;  Surgeon: Lucilla Lame, MD;  Location: [facility];  Service: Endoscopy;  Laterality: N/A;  sleep apnea  . LAPAROSCOPIC PARTIAL COLECTOMY Right 11/17/2019   Procedure: LAPAROSCOPIC PARTIAL COLECTOMY RIGHT EXTENDED;  Surgeon: Olean Ree, MD;  Location: [facility];  Service: General;  Laterality: Right;  . PORTACATH PLACEMENT N/A 12/28/2019   Procedure: INSERTION PORT-A-CATH Left subclavian;  Surgeon: Olean Ree, MD;  Location: [facility];  Service: General;  Laterality: N/A;    FAMILY HISTORY: Family History  Problem Relation Age of Onset  . Healthy Mother     ADVANCED DIRECTIVES (Y/N):  N  HEALTH MAINTENANCE: Social History   Tobacco Use  . Smoking status: Never Smoker  . Smokeless tobacco: Never Used  Vaping  Use  . Vaping Use: Never used  Substance Use Topics  . Alcohol use: Not Currently  . Drug use: Never     Colonoscopy:  PAP:  Bone density:  Lipid panel:  No Known Allergies  Current Outpatient Medications  Medication Sig Dispense Refill  . APPLE CIDER VINEGAR PO Take 30-45 mLs by mouth every other day.     Marland Kitchen aspirin 325 MG EC tablet Take 325 mg by  mouth every other day. In the morning    . digoxin (DIGOX) 0.25 MG tablet Take 0.25 mg by mouth daily.     . furosemide (LASIX) 80 MG tablet Take 80 mg by mouth 2 (two) times daily. Morning & late afternoon    . lidocaine-prilocaine (EMLA) cream Apply to affected area once 30 g 3  . Misc Natural Products (TART CHERRY ADVANCED PO) Take 60 mLs by mouth every other day.    . potassium bicarbonate (KLOR-CON/EF) 25 MEQ disintegrating tablet Take 25 mEq by mouth 4 (four) times a week. In the afternoon.    . prochlorperazine (COMPAZINE) 10 MG tablet Take 1 tablet (10 mg total) by mouth every 6 (six) hours as needed (Nausea or vomiting). 60 tablet 2   No current facility-administered medications for this visit.    OBJECTIVE: Vitals:   06/01/20 0944  BP: 120/80  Pulse: 70  Temp: (!) 97.4 F (36.3 C)  SpO2: 99%     Body mass index is 31.13 kg/m.    ECOG FS:0 - Asymptomatic  General: Well-developed, well-nourished, no acute distress. Eyes: Pink conjunctiva, anicteric sclera. HEENT: Normocephalic, moist mucous membranes. Lungs: No audible wheezing or coughing. Heart: Regular rate and rhythm. Abdomen: Soft, nontender, no obvious distention. Musculoskeletal: No edema, cyanosis, or clubbing. Neuro: Alert, answering all questions appropriately. Cranial nerves grossly intact. Skin: No rashes or petechiae noted. Psych: Normal affect.   LAB RESULTS:  Lab Results  Component Value Date   NA 139 06/01/2020   K 3.2 (L) 06/01/2020   CL 106 06/01/2020   CO2 29 06/01/2020   GLUCOSE 103 (H) 06/01/2020   BUN 17 06/01/2020   CREATININE 1.22 06/01/2020   CALCIUM 7.9 (L) 06/01/2020   PROT 6.3 (L) 06/01/2020   ALBUMIN 2.6 (L) 06/01/2020   AST 42 (H) 06/01/2020   ALT 17 06/01/2020   ALKPHOS 181 (H) 06/01/2020   BILITOT 0.7 06/01/2020   GFRNONAA >60 06/01/2020   GFRAA >60 06/01/2020    Lab Results  Component Value Date   WBC 2.7 (L) 06/01/2020   NEUTROABS 1.6 (L) 06/01/2020   HGB 9.9 (L)  06/01/2020   HCT 31.0 (L) 06/01/2020   MCV 93.7 06/01/2020   PLT 83 (L) 06/01/2020   Lab Results  Component Value Date   IRON 41 (L) 05/11/2020   TIBC 323 05/11/2020   IRONPCTSAT 13 (L) 05/11/2020   Lab Results  Component Value Date   FERRITIN 86 05/11/2020     STUDIES: No results found.  ASSESSMENT: Stage IIIc colon cancer, smoldering myeloma.  PLAN:    1.  Stage IIIc colon cancer: Imaging and final pathology reviewed independently confirming stage of disease.  Patient has agreed to adjuvant FOLFOX chemotherapy for 12 cycles. Patient has now had port placement.  Proceed with cycle 9 of FOLFOX today. Given his persistent neutropenia, patient will receive Udenyca with his pump removal to the remainder of his treatments.  Because of persistent thrombocytopenia, patient will now receive treatment every 3 weeks.  Return to clinic in 2 days for pump  removal and Udenyca.  Patient will then return to clinic in 3 weeks for further evaluation and consideration of cycle 10. 2. Smoldering myeloma: Patient was initially diagnosed and treated in [place], [place] and treated with single agent Velcade in approximately 2013.  He was evaluated in clinic in July 2014 when he declined any maintenance treatment or referral for bone marrow transplant.  He was subsequently lost to follow-up.  His most recent SPEP revealed an M spike of 2.0 with an IgG predominance of 2949.  These are unchanged from 3 months prior.  He also has a suppressed IgA level.  Kappa/lambda light chain ratio has trended up slightly to 2.17.  He has mild renal insufficiency, but no other evidence of endorgan damage.  Metastatic bone survey on May 20, 2019 reviewed independently with no obvious bony lesions.  Bone marrow biopsy completed on May 28, 2019 revealed only 10 to 15% plasma cells with no clonality reported.  Cytogenetics were also reported as normal.  No intervention is needed at this time.  Continue to monitor closely. 3.   Iron deficiency anemia: Chronic and unchanged.  Patient's most recent hemoglobin is 9.9.  He has mildly decreased total iron and iron saturation.  Monitor.  He last received IV Feraheme on October 06, 2019.   4.  Renal insufficiency: Improved.  Patient's creatinine is within normal limits today. 5.  Peripheral neuropathy: Chronic and unchanged.  Patient does not complain of this today. 6.  Neutropenia: Mild.  Proceed with treatment today.  Udenyca with pump removal as above.  7. Thrombocytopenia: Chronic and unchanged.  Patient's platelet count is 83 today.  Continue to treat as long as platelets remain above 75.  Treatment every 3 weeks as above. 8.  Hypokalemia: Chronic and unchanged.  Continue oral potassium supplementation as prescribed.  Patient expressed understanding and was in agreement with this plan. He also understands that He can call clinic at any time with any questions, concerns, or complaints.    Lloyd Huger, MD   06/02/2020 8:58 PM

## 2020-06-01 ENCOUNTER — Inpatient Hospital Stay: Payer: BC Managed Care – PPO

## 2020-06-01 ENCOUNTER — Inpatient Hospital Stay (HOSPITAL_BASED_OUTPATIENT_CLINIC_OR_DEPARTMENT_OTHER): Payer: BC Managed Care – PPO | Admitting: Oncology

## 2020-06-01 ENCOUNTER — Encounter: Payer: Self-pay | Admitting: Oncology

## 2020-06-01 ENCOUNTER — Other Ambulatory Visit: Payer: Self-pay

## 2020-06-01 VITALS — BP 120/80 | HR 70 | Temp 97.4°F | Wt 210.8 lb

## 2020-06-01 DIAGNOSIS — C184 Malignant neoplasm of transverse colon: Secondary | ICD-10-CM | POA: Diagnosis not present

## 2020-06-01 DIAGNOSIS — Z5111 Encounter for antineoplastic chemotherapy: Secondary | ICD-10-CM | POA: Diagnosis not present

## 2020-06-01 DIAGNOSIS — Z95828 Presence of other vascular implants and grafts: Secondary | ICD-10-CM

## 2020-06-01 LAB — CBC WITH DIFFERENTIAL/PLATELET
Abs Immature Granulocytes: 0.01 10*3/uL (ref 0.00–0.07)
Basophils Absolute: 0 10*3/uL (ref 0.0–0.1)
Basophils Relative: 0 %
Eosinophils Absolute: 0.1 10*3/uL (ref 0.0–0.5)
Eosinophils Relative: 2 %
HCT: 31 % — ABNORMAL LOW (ref 39.0–52.0)
Hemoglobin: 9.9 g/dL — ABNORMAL LOW (ref 13.0–17.0)
Immature Granulocytes: 0 %
Lymphocytes Relative: 17 %
Lymphs Abs: 0.5 10*3/uL — ABNORMAL LOW (ref 0.7–4.0)
MCH: 29.9 pg (ref 26.0–34.0)
MCHC: 31.9 g/dL (ref 30.0–36.0)
MCV: 93.7 fL (ref 80.0–100.0)
Monocytes Absolute: 0.6 10*3/uL (ref 0.1–1.0)
Monocytes Relative: 23 %
Neutro Abs: 1.6 10*3/uL — ABNORMAL LOW (ref 1.7–7.7)
Neutrophils Relative %: 58 %
Platelets: 83 10*3/uL — ABNORMAL LOW (ref 150–400)
RBC: 3.31 MIL/uL — ABNORMAL LOW (ref 4.22–5.81)
RDW: 15.2 % (ref 11.5–15.5)
WBC: 2.7 10*3/uL — ABNORMAL LOW (ref 4.0–10.5)
nRBC: 0 % (ref 0.0–0.2)

## 2020-06-01 LAB — COMPREHENSIVE METABOLIC PANEL
ALT: 17 U/L (ref 0–44)
AST: 42 U/L — ABNORMAL HIGH (ref 15–41)
Albumin: 2.6 g/dL — ABNORMAL LOW (ref 3.5–5.0)
Alkaline Phosphatase: 181 U/L — ABNORMAL HIGH (ref 38–126)
Anion gap: 4 — ABNORMAL LOW (ref 5–15)
BUN: 17 mg/dL (ref 8–23)
CO2: 29 mmol/L (ref 22–32)
Calcium: 7.9 mg/dL — ABNORMAL LOW (ref 8.9–10.3)
Chloride: 106 mmol/L (ref 98–111)
Creatinine, Ser: 1.22 mg/dL (ref 0.61–1.24)
GFR calc Af Amer: >60 mL/min (ref >60)
GFR calc non Af Amer: >60 mL/min (ref >60)
Glucose, Bld: 103 mg/dL — ABNORMAL HIGH (ref 70–99)
Potassium: 3.2 mmol/L — ABNORMAL LOW (ref 3.5–5.1)
Sodium: 139 mmol/L (ref 135–145)
Total Bilirubin: 0.7 mg/dL (ref 0.3–1.2)
Total Protein: 6.3 g/dL — ABNORMAL LOW (ref 6.5–8.1)

## 2020-06-01 MED ORDER — PALONOSETRON HCL INJECTION 0.25 MG/5ML
0.2500 mg | Freq: Once | INTRAVENOUS | Status: AC
  Administered 2020-06-01: 0.25 mg via INTRAVENOUS
  Filled 2020-06-01: qty 5

## 2020-06-01 MED ORDER — FLUOROURACIL CHEMO INJECTION 2.5 GM/50ML
400.0000 mg/m2 | Freq: Once | INTRAVENOUS | Status: AC
  Administered 2020-06-01: 850 mg via INTRAVENOUS
  Filled 2020-06-01: qty 17

## 2020-06-01 MED ORDER — OXALIPLATIN CHEMO INJECTION 100 MG/20ML
85.0000 mg/m2 | Freq: Once | INTRAVENOUS | Status: AC
  Administered 2020-06-01: 185 mg via INTRAVENOUS
  Filled 2020-06-01: qty 37

## 2020-06-01 MED ORDER — DEXTROSE 5 % IV SOLN
Freq: Once | INTRAVENOUS | Status: AC
  Filled 2020-06-01: qty 250

## 2020-06-01 MED ORDER — SODIUM CHLORIDE 0.9 % IV SOLN
10.0000 mg | Freq: Once | INTRAVENOUS | Status: AC
  Administered 2020-06-01: 10 mg via INTRAVENOUS
  Filled 2020-06-01: qty 10

## 2020-06-01 MED ORDER — LEUCOVORIN CALCIUM INJECTION 350 MG
850.0000 mg | Freq: Once | INTRAVENOUS | Status: AC
  Administered 2020-06-01: 850 mg via INTRAVENOUS
  Filled 2020-06-01: qty 25

## 2020-06-01 MED ORDER — SODIUM CHLORIDE 0.9% FLUSH
10.0000 mL | Freq: Once | INTRAVENOUS | Status: AC
  Administered 2020-06-01: 10 mL via INTRAVENOUS
  Filled 2020-06-01: qty 10

## 2020-06-01 MED ORDER — SODIUM CHLORIDE 0.9 % IV SOLN
2400.0000 mg/m2 | INTRAVENOUS | Status: DC
  Administered 2020-06-01: 5150 mg via INTRAVENOUS
  Filled 2020-06-01: qty 103

## 2020-06-03 ENCOUNTER — Other Ambulatory Visit: Payer: Self-pay

## 2020-06-03 ENCOUNTER — Inpatient Hospital Stay: Payer: BC Managed Care – PPO

## 2020-06-03 VITALS — BP 111/76 | HR 72 | Temp 98.1°F | Resp 18

## 2020-06-03 DIAGNOSIS — Z5111 Encounter for antineoplastic chemotherapy: Secondary | ICD-10-CM | POA: Diagnosis not present

## 2020-06-03 DIAGNOSIS — C184 Malignant neoplasm of transverse colon: Secondary | ICD-10-CM

## 2020-06-03 MED ORDER — PEGFILGRASTIM-CBQV 6 MG/0.6ML ~~LOC~~ SOSY
6.0000 mg | PREFILLED_SYRINGE | Freq: Once | SUBCUTANEOUS | Status: AC
  Administered 2020-06-03: 6 mg via SUBCUTANEOUS
  Filled 2020-06-03: qty 0.6

## 2020-06-03 MED ORDER — SODIUM CHLORIDE 0.9% FLUSH
10.0000 mL | INTRAVENOUS | Status: DC | PRN
  Administered 2020-06-03: 10 mL
  Filled 2020-06-03: qty 10

## 2020-06-03 MED ORDER — HEPARIN SOD (PORK) LOCK FLUSH 100 UNIT/ML IV SOLN
INTRAVENOUS | Status: AC
  Filled 2020-06-03: qty 5

## 2020-06-03 MED ORDER — HEPARIN SOD (PORK) LOCK FLUSH 100 UNIT/ML IV SOLN
500.0000 [IU] | Freq: Once | INTRAVENOUS | Status: AC | PRN
  Administered 2020-06-03: 500 [IU]
  Filled 2020-06-03: qty 5

## 2020-06-10 ENCOUNTER — Telehealth: Payer: Self-pay | Admitting: *Deleted

## 2020-06-10 NOTE — Telephone Encounter (Signed)
Call returned to patient at request of patient. I informed patient that his FMLA forms have been completed and are awaiting MD signature. I informed patient to expect forms to be ready to be emailed on Monday. Patient also reports that he has noticed ongoing tingling and cold sensitivity for longer periods of time post treatment, just wanted to make you aware.

## 2020-06-13 NOTE — Telephone Encounter (Signed)
I called pt and told him that emailed the FMLA papers to his email on file and he said he will look for it when he gets home and then send it in to his company and if he has issues can call me back. He is agreeable to this.

## 2020-06-17 NOTE — Progress Notes (Signed)
[facility]  [phone] [phone]  ID: NYAL SCHACHTER OB: 06-24-1956  MR#: [id number]  VZD#:[id number]  Patient Care Team: Sofie Hartigan, MD as PCP - General (Family Medicine) Lloyd Huger, MD as Consulting Physician (Oncology)  CHIEF COMPLAINT: Stage IIIc colon cancer, smoldering myeloma.  INTERVAL HISTORY: Patient returns to clinic today for further evaluation and consideration of cycle 10 of 12 of adjuvant FOLFOX.  Since his last treatment he has had decreased performance status and poor appetite.  He has had difficulty playing tennis.  He also feels his cold neuropathy is slightly worse.  He has no other neurologic complaints.  He has no chest pain, shortness of breath, cough, or hemoptysis.  He denies any nausea, vomiting, constipation, or diarrhea.  He has no melena or hematochezia.  He has no urinary complaints.  Patient offers no further specific complaints today.  REVIEW OF SYSTEMS:   Review of Systems  Constitutional: Negative.  Negative for fever, malaise/fatigue and weight loss.  Respiratory: Negative.  Negative for cough, hemoptysis and shortness of breath.   Cardiovascular: Negative.  Negative for chest pain and leg swelling.  Gastrointestinal: Negative.  Negative for abdominal pain, blood in stool and melena.  Genitourinary: Negative.  Negative for hematuria.  Musculoskeletal: Negative.  Negative for back pain.  Skin: Negative.  Negative for rash.  Neurological: Negative.  Negative for dizziness, focal weakness, weakness and headaches.  Psychiatric/Behavioral: Negative.  The patient is not nervous/anxious.     As per HPI. Otherwise, a complete review of systems is negative.  PAST MEDICAL HISTORY: Past Medical History:  Diagnosis Date  . A-fib ([place])   . Anemia   . Arthritis    ankles  . Cancer ([place])    multiple myeloma  . 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[place]) 10/26/2019  . Cardiomyopathy ([place])   . CHF (congestive  heart failure) ([place])   . Depression   . Dysrhythmia    atrial fib  . HLD (hyperlipidemia)   . Hypercholesteremia   . Hypertension   . Moderate tricuspid insufficiency   . Multiple myeloma ([place])    not being treated right now per pt 11/11/19  . Pneumonia   . Sleep apnea    No CPAP    PAST SURGICAL HISTORY: Past Surgical History:  Procedure Laterality Date  . ATRIAL ABLATION SURGERY    . CARDIAC SURGERY     A-Fib Ablations  . COLONOSCOPY WITH PROPOFOL N/A 10/26/2019   Procedure: COLONOSCOPY WITH BIOPSY;  Surgeon: Lucilla Lame, MD;  Location: [facility];  Service: Endoscopy;  Laterality: N/A;  . ESOPHAGOGASTRODUODENOSCOPY (EGD) WITH PROPOFOL N/A 10/26/2019   Procedure: ESOPHAGOGASTRODUODENOSCOPY (EGD) WITH BIOPSY;  Surgeon: Lucilla Lame, MD;  Location: [facility];  Service: Endoscopy;  Laterality: N/A;  sleep apnea  . LAPAROSCOPIC PARTIAL COLECTOMY Right 11/17/2019   Procedure: LAPAROSCOPIC PARTIAL COLECTOMY RIGHT EXTENDED;  Surgeon: Olean Ree, MD;  Location: [facility];  Service: General;  Laterality: Right;  . PORTACATH PLACEMENT N/A 12/28/2019   Procedure: INSERTION PORT-A-CATH Left subclavian;  Surgeon: Olean Ree, MD;  Location: [facility];  Service: General;  Laterality: N/A;    FAMILY HISTORY: Family History  Problem Relation Age of Onset  . Healthy Mother     ADVANCED DIRECTIVES (Y/N):  N  HEALTH MAINTENANCE: Social History   Tobacco Use  . Smoking status: Never Smoker  . Smokeless tobacco: Never Used  Vaping Use  . Vaping Use: Never used  Substance Use Topics  . Alcohol use:  Not Currently  . Drug use: Never     Colonoscopy:  PAP:  Bone density:  Lipid panel:  No Known Allergies  Current Outpatient Medications  Medication Sig Dispense Refill  . APPLE CIDER VINEGAR PO Take 30-45 mLs by mouth every other day.     Marland Kitchen aspirin 325 MG EC tablet Take 325 mg by mouth every other day. In the morning    . digoxin (DIGOX) 0.25 MG tablet Take  0.25 mg by mouth daily.     . furosemide (LASIX) 80 MG tablet Take 80 mg by mouth 2 (two) times daily. Morning & late afternoon    . lidocaine-prilocaine (EMLA) cream Apply to affected area once 30 g 3  . Misc Natural Products (TART CHERRY ADVANCED PO) Take 60 mLs by mouth every other day.    . potassium bicarbonate (KLOR-CON/EF) 25 MEQ disintegrating tablet Take 25 mEq by mouth 4 (four) times a week. In the afternoon.    . prochlorperazine (COMPAZINE) 10 MG tablet Take 1 tablet (10 mg total) by mouth every 6 (six) hours as needed (Nausea or vomiting). 60 tablet 2   No current facility-administered medications for this visit.   Facility-Administered Medications Ordered in Other Visits  Medication Dose Route Frequency Provider Last Rate Last Admin  . heparin lock flush 100 unit/mL  500 Units Intracatheter Once PRN Lloyd Huger, MD        OBJECTIVE: Vitals:   06/22/20 0905  BP: 110/66  Pulse: 67  Resp: 16  Temp: (!) 96.3 F (35.7 C)  SpO2: 100%     Body mass index is 30.86 kg/m.    ECOG FS:0 - Asymptomatic  General: Well-developed, well-nourished, no acute distress. Eyes: Pink conjunctiva, anicteric sclera. HEENT: Normocephalic, moist mucous membranes. Lungs: No audible wheezing or coughing. Heart: Regular rate and rhythm. Abdomen: Soft, nontender, no obvious distention. Musculoskeletal: No edema, cyanosis, or clubbing. Neuro: Alert, answering all questions appropriately. Cranial nerves grossly intact. Skin: No rashes or petechiae noted. Psych: Normal affect.   LAB RESULTS:  Lab Results  Component Value Date   NA 141 06/22/2020   K 3.6 06/22/2020   CL 106 06/22/2020   CO2 28 06/22/2020   GLUCOSE 111 (H) 06/22/2020   BUN 18 06/22/2020   CREATININE 1.31 (H) 06/22/2020   CALCIUM 7.8 (L) 06/22/2020   PROT 6.2 (L) 06/22/2020   ALBUMIN 2.5 (L) 06/22/2020   AST 44 (H) 06/22/2020   ALT 17 06/22/2020   ALKPHOS 164 (H) 06/22/2020   BILITOT 0.7 06/22/2020   GFRNONAA  57 (L) 06/22/2020   GFRAA >60 06/22/2020    Lab Results  Component Value Date   WBC 2.6 (L) 06/22/2020   NEUTROABS 1.4 (L) 06/22/2020   HGB 9.9 (L) 06/22/2020   HCT 30.0 (L) 06/22/2020   MCV 91.5 06/22/2020   PLT 96 (L) 06/22/2020   Lab Results  Component Value Date   IRON 41 (L) 05/11/2020   TIBC 323 05/11/2020   IRONPCTSAT 13 (L) 05/11/2020   Lab Results  Component Value Date   FERRITIN 86 05/11/2020     STUDIES: No results found.  ASSESSMENT: Stage IIIc colon cancer, smoldering myeloma.  PLAN:    1.  Stage IIIc colon cancer: Imaging and final pathology reviewed independently confirming stage of disease.  Patient completed cycle 9 of adjuvant FOLFOX on 06/03/2020.  Given his difficulties with treatment and declining performance status, have elected to discontinue treatment altogether and forego his final 3 infusions.  No further interventions are  needed.  Return to clinic in 4 weeks for laboratory work and further evaluation.   2. Smoldering myeloma: Patient was initially diagnosed and treated in [place], [place] and treated with single agent Velcade in approximately 2013.  He was evaluated in clinic in July 2014 when he declined any maintenance treatment or referral for bone marrow transplant.  He was subsequently lost to follow-up.  His most recent SPEP revealed an M spike of 2.0 with an IgG predominance of 2949.  These are unchanged from 3 months prior.  He also has a suppressed IgA level.  Kappa/lambda light chain ratio has trended up slightly to 2.17.  He has mild renal insufficiency, but no other evidence of endorgan damage.  Metastatic bone survey on May 20, 2019 reviewed independently with no obvious bony lesions.  Bone marrow biopsy completed on May 28, 2019 revealed only 10 to 15% plasma cells with no clonality reported.  Cytogenetics were also reported as normal.  No intervention is needed at this time.  Continue to monitor closely. 3.  Iron deficiency anemia:  Chronic and unchanged.  Patient's most recent hemoglobin is 9.9.  He has mildly decreased total iron and iron saturation.  Monitor.  He last received IV Feraheme on October 06, 2019.   4.  Renal insufficiency: Slightly worse today.  Patient's creatinine is 1.31.  Patient will receive 1 L of IV fluids today. 5.  Peripheral neuropathy: Slightly worse today.  Monitor. 6.  Neutropenia: Mild.  Discontinue treatment as above. 7. Thrombocytopenia: Chronic and unchanged.  Discontinue treatment as above. 8.  Hypokalemia: Resolved.  Patient expressed understanding and was in agreement with this plan. He also understands that He can call clinic at any time with any questions, concerns, or complaints.    Lloyd Huger, MD   06/22/2020 10:20 AM

## 2020-06-21 ENCOUNTER — Encounter: Payer: Self-pay | Admitting: Oncology

## 2020-06-21 NOTE — Progress Notes (Signed)
Patient called for pre assessment. States he has not been feeling well since his last chemo treatment 3 weeks ago. He states he is having a lot of pain in stomach and right hip, tingling and numbness in hands and feet, and diarrhea off and on. He feels like his body is tired of the treatment. He states he would like to discuss with provider tomorrow at appointment.

## 2020-06-22 ENCOUNTER — Inpatient Hospital Stay: Payer: BC Managed Care – PPO

## 2020-06-22 ENCOUNTER — Inpatient Hospital Stay: Payer: BC Managed Care – PPO | Attending: Oncology

## 2020-06-22 ENCOUNTER — Other Ambulatory Visit: Payer: Self-pay

## 2020-06-22 ENCOUNTER — Inpatient Hospital Stay (HOSPITAL_BASED_OUTPATIENT_CLINIC_OR_DEPARTMENT_OTHER): Payer: BC Managed Care – PPO | Admitting: Oncology

## 2020-06-22 DIAGNOSIS — G620 Drug-induced polyneuropathy: Secondary | ICD-10-CM | POA: Insufficient documentation

## 2020-06-22 DIAGNOSIS — C9000 Multiple myeloma not having achieved remission: Secondary | ICD-10-CM | POA: Insufficient documentation

## 2020-06-22 DIAGNOSIS — Z9049 Acquired absence of other specified parts of digestive tract: Secondary | ICD-10-CM | POA: Diagnosis not present

## 2020-06-22 DIAGNOSIS — G3184 Mild cognitive impairment, so stated: Secondary | ICD-10-CM | POA: Diagnosis present

## 2020-06-22 DIAGNOSIS — C184 Malignant neoplasm of transverse colon: Secondary | ICD-10-CM | POA: Diagnosis not present

## 2020-06-22 DIAGNOSIS — F4320 Adjustment disorder, unspecified: Secondary | ICD-10-CM | POA: Diagnosis not present

## 2020-06-22 DIAGNOSIS — T451X5A Adverse effect of antineoplastic and immunosuppressive drugs, initial encounter: Secondary | ICD-10-CM | POA: Diagnosis not present

## 2020-06-22 DIAGNOSIS — D509 Iron deficiency anemia, unspecified: Secondary | ICD-10-CM

## 2020-06-22 LAB — COMPREHENSIVE METABOLIC PANEL
ALT: 17 U/L (ref 0–44)
AST: 44 U/L — ABNORMAL HIGH (ref 15–41)
Albumin: 2.5 g/dL — ABNORMAL LOW (ref 3.5–5.0)
Alkaline Phosphatase: 164 U/L — ABNORMAL HIGH (ref 38–126)
Anion gap: 7 (ref 5–15)
BUN: 18 mg/dL (ref 8–23)
CO2: 28 mmol/L (ref 22–32)
Calcium: 7.8 mg/dL — ABNORMAL LOW (ref 8.9–10.3)
Chloride: 106 mmol/L (ref 98–111)
Creatinine, Ser: 1.31 mg/dL — ABNORMAL HIGH (ref 0.61–1.24)
GFR calc Af Amer: >60 mL/min (ref >60)
GFR calc non Af Amer: 57 mL/min — ABNORMAL LOW (ref >60)
Glucose, Bld: 111 mg/dL — ABNORMAL HIGH (ref 70–99)
Potassium: 3.6 mmol/L (ref 3.5–5.1)
Sodium: 141 mmol/L (ref 135–145)
Total Bilirubin: 0.7 mg/dL (ref 0.3–1.2)
Total Protein: 6.2 g/dL — ABNORMAL LOW (ref 6.5–8.1)

## 2020-06-22 LAB — CBC WITH DIFFERENTIAL/PLATELET
Abs Immature Granulocytes: 0.01 10*3/uL (ref 0.00–0.07)
Basophils Absolute: 0 10*3/uL (ref 0.0–0.1)
Basophils Relative: 0 %
Eosinophils Absolute: 0 10*3/uL (ref 0.0–0.5)
Eosinophils Relative: 2 %
HCT: 30 % — ABNORMAL LOW (ref 39.0–52.0)
Hemoglobin: 9.9 g/dL — ABNORMAL LOW (ref 13.0–17.0)
Immature Granulocytes: 0 %
Lymphocytes Relative: 17 %
Lymphs Abs: 0.4 10*3/uL — ABNORMAL LOW (ref 0.7–4.0)
MCH: 30.2 pg (ref 26.0–34.0)
MCHC: 33 g/dL (ref 30.0–36.0)
MCV: 91.5 fL (ref 80.0–100.0)
Monocytes Absolute: 0.7 10*3/uL (ref 0.1–1.0)
Monocytes Relative: 28 %
Neutro Abs: 1.4 10*3/uL — ABNORMAL LOW (ref 1.7–7.7)
Neutrophils Relative %: 53 %
Platelets: 96 10*3/uL — ABNORMAL LOW (ref 150–400)
RBC: 3.28 MIL/uL — ABNORMAL LOW (ref 4.22–5.81)
RDW: 15.2 % (ref 11.5–15.5)
WBC: 2.6 10*3/uL — ABNORMAL LOW (ref 4.0–10.5)
nRBC: 0 % (ref 0.0–0.2)

## 2020-06-22 MED ORDER — SODIUM CHLORIDE 0.9 % IV SOLN
Freq: Once | INTRAVENOUS | Status: AC
  Filled 2020-06-22: qty 250

## 2020-06-22 MED ORDER — HEPARIN SOD (PORK) LOCK FLUSH 100 UNIT/ML IV SOLN
500.0000 [IU] | Freq: Once | INTRAVENOUS | Status: AC | PRN
  Administered 2020-06-22: 500 [IU]
  Filled 2020-06-22: qty 5

## 2020-06-22 MED ORDER — HEPARIN SOD (PORK) LOCK FLUSH 100 UNIT/ML IV SOLN
INTRAVENOUS | Status: AC
  Filled 2020-06-22: qty 5

## 2020-06-24 ENCOUNTER — Inpatient Hospital Stay: Payer: BC Managed Care – PPO

## 2020-06-27 ENCOUNTER — Telehealth: Payer: Self-pay | Admitting: *Deleted

## 2020-06-27 NOTE — Telephone Encounter (Signed)
[place],  thanks.  Not sure what's going on with him.  He should not be getting worse a month after treatment was discontinued.  Hopefully you guys can see him tomorrow.

## 2020-06-27 NOTE — Telephone Encounter (Signed)
4:56 pm. Attempted calling pt back regarding his health concerns. Unable to leave message.

## 2020-06-27 NOTE — Telephone Encounter (Signed)
Patient called reporting that he is having side effects from his treatment that are not going away. He reportst that his hands and feet are numb and tingling and that his balance is off as a result; as well as feeling weak in his legs. Also reports that he is having "stomach pains" that he has not had before. "Almost like I have an ulcer or something" States he is eating well and gaining weight. States his last treatment was a month ago. Please advise

## 2020-06-28 ENCOUNTER — Inpatient Hospital Stay (HOSPITAL_BASED_OUTPATIENT_CLINIC_OR_DEPARTMENT_OTHER): Payer: BC Managed Care – PPO | Admitting: Nurse Practitioner

## 2020-06-28 ENCOUNTER — Other Ambulatory Visit: Payer: Self-pay

## 2020-06-28 VITALS — BP 113/62 | HR 55 | Temp 97.8°F | Resp 18 | Wt 215.0 lb

## 2020-06-28 DIAGNOSIS — G620 Drug-induced polyneuropathy: Secondary | ICD-10-CM | POA: Diagnosis not present

## 2020-06-28 DIAGNOSIS — F4329 Adjustment disorder with other symptoms: Secondary | ICD-10-CM | POA: Diagnosis not present

## 2020-06-28 DIAGNOSIS — T451X5A Adverse effect of antineoplastic and immunosuppressive drugs, initial encounter: Secondary | ICD-10-CM | POA: Diagnosis not present

## 2020-06-28 MED ORDER — DULOXETINE HCL 30 MG PO CPEP: ORAL_CAPSULE | ORAL | 0 refills | Status: DC

## 2020-06-28 NOTE — Progress Notes (Signed)
[facility] today for ongoing neuropathy issues (numbness and tingling) and balance feels off alittle bit. 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 He hasn't been able to play like he wants to. Difficulty buttoning shirts and working on computer. Concentration isn't there like it used to be. He does have stomach pains at times especially if he hasn't eaten ie, early mornings. Reports he has a great appetite eats anything and everything. Bowels regular.Very independent, lives alone.

## 2020-06-28 NOTE — Telephone Encounter (Signed)
duplicate

## 2020-06-28 NOTE — Progress Notes (Signed)
Symptom Management [place]  [phone] [phone]) [phone]  Patient Care Team: Sofie Hartigan, MD as PCP - General (Family Medicine) Lloyd Huger, MD as Consulting Physician (Oncology)   Name of the patient: Chris George  [id number]  1956-10-18   Date of visit: 06/28/20  Diagnosis-colon cancer  Chief complaint/ Reason for visit-numbness of the hands and feet, poor balance and concentration  Heme/Onc history:  Oncology History  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[place])  10/27/2019 Initial Diagnosis   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[place])   12/01/2019 Cancer Staging   Staging form: Colon and Rectum, AJCC 8th Edition - Clinical stage from 12/01/2019: Stage IIIC (cT3, cN2b, cM0) - Signed by Lloyd Huger, MD on 12/01/2019   12/30/2019 -  Chemotherapy   The patient had palonosetron (ALOXI) injection 0.25 mg, 0.25 mg, Intravenous,  Once, 9 of 12 cycles Administration: 0.25 mg (12/30/2019), 0.25 mg (01/13/2020), 0.25 mg (01/27/2020), 0.25 mg (02/17/2020), 0.25 mg (03/02/2020), 0.25 mg (03/23/2020), 0.25 mg (04/13/2020), 0.25 mg (05/11/2020), 0.25 mg (06/01/2020) pegfilgrastim-cbqv (UDENYCA) injection 6 mg, 6 mg, Subcutaneous, Once, 6 of 9 cycles Administration: 6 mg (02/19/2020), 6 mg (03/04/2020), 6 mg (03/25/2020), 6 mg (04/15/2020), 6 mg (05/13/2020), 6 mg (06/03/2020) leucovorin 850 mg in dextrose 5 % 250 mL infusion, 860 mg, Intravenous,  Once, 9 of 12 cycles Administration: 850 mg (12/30/2019), 850 mg (01/27/2020), 850 mg (02/17/2020), 850 mg (03/02/2020), 850 mg (03/23/2020), 850 mg (04/13/2020), 850 mg (05/11/2020), 850 mg (06/01/2020) oxaliplatin (ELOXATIN) 185 mg in dextrose 5 % 500 mL chemo infusion, 85 mg/m2 = 185 mg, Intravenous,  Once, 9 of 12 cycles Administration: 185 mg (12/30/2019), 185 mg (01/13/2020), 185 mg (01/27/2020), 185 mg (02/17/2020), 185 mg (03/02/2020), 185 mg (03/23/2020), 185 mg (04/13/2020), 185 mg (05/11/2020), 185 mg (06/01/2020) fluorouracil  (ADRUCIL) chemo injection 850 mg, 400 mg/m2 = 850 mg, Intravenous,  Once, 9 of 12 cycles Administration: 850 mg (12/30/2019), 850 mg (01/13/2020), 850 mg (01/27/2020), 850 mg (02/17/2020), 850 mg (03/02/2020), 850 mg (03/23/2020), 850 mg (04/13/2020), 850 mg (05/11/2020), 850 mg (06/01/2020) fluorouracil (ADRUCIL) 5,000 mg in sodium chloride 0.9 % 150 mL chemo infusion, 5,150 mg, Intravenous, 1 Day/Dose, 9 of 12 cycles Administration: 5,000 mg (12/30/2019), 5,150 mg (01/13/2020), 5,150 mg (01/27/2020), 5,150 mg (02/17/2020), 5,150 mg (03/02/2020), 5,150 mg (03/23/2020), 5,150 mg (04/13/2020), 5,150 mg (05/11/2020), 5,150 mg (06/01/2020)  for chemotherapy treatment.      Interval history-patient presents to symptom management clinic for complaints of numbness and tingling of the tips of his fingers and toes.  He says that this started with his last cycle of chemotherapy and has persisted since that time.  Due to symptoms he has had episodes of imbalance and near falls.  Denies pain or injury.  Difficulty buttoning shirts and performing ADLs.  Unable to play tennis which is his favorite past time due to symptoms.  Also complains of poor concentration and brain fog at work.  Says he just does not feel like himself.  Concerned of needing more time to recover from cancer treatments and juggling finances and bills.  He has not sought treatment for this previously.  Would prefer to maximize nonpharmacologic therapies if possible.  Lives alone.  Works in Therapist, art.  ECOG FS:1 - Symptomatic but completely ambulatory  Review of systems- Review of Systems  Constitutional: Negative for chills, fever, malaise/fatigue and weight loss.  HENT: Negative for hearing loss, nosebleeds, sore throat and tinnitus.   Eyes: Negative for blurred vision and double  vision.  Respiratory: Negative for cough, hemoptysis, shortness of breath and wheezing.   Cardiovascular: Negative for chest pain, palpitations and leg swelling.   Gastrointestinal: Negative for abdominal pain, blood in stool, constipation, diarrhea, melena, nausea and vomiting.  Genitourinary: Negative for dysuria and urgency.  Musculoskeletal: Negative for back pain, falls, joint pain and myalgias.  Skin: Negative for itching and rash.  Neurological: Positive for tingling and sensory change. Negative for dizziness, loss of consciousness, weakness and headaches.  Endo/Heme/Allergies: Negative for environmental allergies. Does not bruise/bleed easily.  Psychiatric/Behavioral: Positive for depression. The patient is nervous/anxious. The patient does not have insomnia.      Current treatment-FOLFOX  No Known Allergies  Past Medical History:  Diagnosis Date  . A-fib ([place])   . Anemia   . Arthritis    ankles  . Cancer ([place])    multiple myeloma  . 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[place]) 10/26/2019  . Cardiomyopathy ([place])   . CHF (congestive heart failure) ([place])   . Depression   . Dysrhythmia    atrial fib  . HLD (hyperlipidemia)   . Hypercholesteremia   . Hypertension   . Moderate tricuspid insufficiency   . Multiple myeloma ([place])    not being treated right now per pt 11/11/19  . Pneumonia   . Sleep apnea    No CPAP    Past Surgical History:  Procedure Laterality Date  . ATRIAL ABLATION SURGERY    . CARDIAC SURGERY     A-Fib Ablations  . COLONOSCOPY WITH PROPOFOL N/A 10/26/2019   Procedure: COLONOSCOPY WITH BIOPSY;  Surgeon: Lucilla Lame, MD;  Location: [facility];  Service: Endoscopy;  Laterality: N/A;  . ESOPHAGOGASTRODUODENOSCOPY (EGD) WITH PROPOFOL N/A 10/26/2019   Procedure: ESOPHAGOGASTRODUODENOSCOPY (EGD) WITH BIOPSY;  Surgeon: Lucilla Lame, MD;  Location: [facility];  Service: Endoscopy;  Laterality: N/A;  sleep apnea  . LAPAROSCOPIC PARTIAL COLECTOMY Right 11/17/2019   Procedure: LAPAROSCOPIC PARTIAL COLECTOMY RIGHT EXTENDED;  Surgeon: Olean Ree, MD;  Location: [facility];  Service: General;  Laterality: Right;  .  PORTACATH PLACEMENT N/A 12/28/2019   Procedure: INSERTION PORT-A-CATH Left subclavian;  Surgeon: Olean Ree, MD;  Location: [facility];  Service: General;  Laterality: N/A;    Social History   Socioeconomic History  . Marital status: Divorced    Spouse name: Not on file  . Number of children: Not on file  . Years of education: Not on file  . Highest education level: Not on file  Occupational History  . Not on file  Tobacco Use  . Smoking status: Never Smoker  . Smokeless tobacco: Never Used  Vaping Use  . Vaping Use: Never used  Substance and Sexual Activity  . Alcohol use: Not Currently  . Drug use: Never  . Sexual activity: Not on file  Other Topics Concern  . Not on file  Social History Narrative  . Not on file   Social Determinants of Health   Financial Resource Strain:   . Difficulty of Paying Living Expenses: Not on file  Food Insecurity:   . Worried About Charity fundraiser in the Last Year: Not on file  . Ran Out of Food in the Last Year: Not on file  Transportation Needs:   . Lack of Transportation (Medical): Not on file  . Lack of Transportation (Non-Medical): Not on file  Physical Activity:   . Days of Exercise per Week: Not on file  . Minutes of Exercise per Session: Not on file  Stress:   .  Feeling of Stress : Not on file  Social Connections:   . Frequency of Communication with Friends and Family: Not on file  . Frequency of Social Gatherings with Friends and Family: Not on file  . Attends Religious Services: Not on file  . Active Member of Clubs or Organizations: Not on file  . Attends Archivist Meetings: Not on file  . Marital Status: Not on file  Intimate Partner Violence:   . Fear of Current or Ex-Partner: Not on file  . Emotionally Abused: Not on file  . Physically Abused: Not on file  . Sexually Abused: Not on file    Family History  Problem Relation Age of Onset  . Healthy Mother      Current Outpatient Medications:  .   APPLE CIDER VINEGAR PO, Take 30-45 mLs by mouth every other day. , Disp: , Rfl:  .  aspirin 325 MG EC tablet, Take 325 mg by mouth every other day. In the morning, Disp: , Rfl:  .  digoxin (DIGOX) 0.25 MG tablet, Take 0.25 mg by mouth daily. , Disp: , Rfl:  .  furosemide (LASIX) 80 MG tablet, Take 80 mg by mouth 2 (two) times daily. Morning & late afternoon, Disp: , Rfl:  .  lidocaine-prilocaine (EMLA) cream, Apply to affected area once, Disp: 30 g, Rfl: 3 .  Misc Natural Products (TART CHERRY ADVANCED PO), Take 60 mLs by mouth every other day., Disp: , Rfl:  .  potassium bicarbonate (KLOR-CON/EF) 25 MEQ disintegrating tablet, Take 25 mEq by mouth 4 (four) times a week. In the afternoon., Disp: , Rfl:  .  prochlorperazine (COMPAZINE) 10 MG tablet, Take 1 tablet (10 mg total) by mouth every 6 (six) hours as needed (Nausea or vomiting). (Patient not taking: Reported on 06/28/2020), Disp: 60 tablet, Rfl: 2  Physical exam:  Vitals:   06/28/20 1346  BP: 113/62  Pulse: (!) 55  Resp: 18  Temp: 97.8 F (36.6 C)  TempSrc: Tympanic  Weight: 215 lb (97.5 kg)   Physical Exam Constitutional:      General: He is not in acute distress.    Appearance: He is well-developed.  HENT:     Head: Normocephalic and atraumatic.     Mouth/Throat:     Pharynx: No oropharyngeal exudate.  Eyes:     General: No scleral icterus.    Conjunctiva/sclera: Conjunctivae normal.  Cardiovascular:     Rate and Rhythm: Normal rate and regular rhythm.     Heart sounds: Normal heart sounds.  Pulmonary:     Effort: Pulmonary effort is normal.     Breath sounds: Normal breath sounds. No wheezing.  Abdominal:     General: There is no distension.     Tenderness: There is no abdominal tenderness. There is no guarding.  Musculoskeletal:        General: No deformity. Normal range of motion.     Cervical back: Normal range of motion and neck supple.  Skin:    General: Skin is warm and dry.  Neurological:     Mental  Status: He is alert and oriented to person, place, and time.     Motor: No weakness.     Gait: Gait normal.  Psychiatric:        Mood and Affect: Mood normal.        Behavior: Behavior normal.      CMP Latest Ref Rng & Units 06/22/2020  Glucose 70 - 99 mg/dL 111(H)  BUN 8 -  23 mg/dL 18  Creatinine 0.61 - 1.24 mg/dL 1.31(H)  Sodium 135 - 145 mmol/L 141  Potassium 3.5 - 5.1 mmol/L 3.6  Chloride 98 - 111 mmol/L 106  CO2 22 - 32 mmol/L 28  Calcium 8.9 - 10.3 mg/dL 7.8(L)  Total Protein 6.5 - 8.1 g/dL 6.2(L)  Total Bilirubin 0.3 - 1.2 mg/dL 0.7  Alkaline Phos 38 - 126 U/L 164(H)  AST 15 - 41 U/L 44(H)  ALT 0 - 44 U/L 17   CBC Latest Ref Rng & Units 06/22/2020  WBC 4.0 - 10.5 K/uL 2.6(L)  Hemoglobin 13.0 - 17.0 g/dL 9.9(L)  Hematocrit 39 - 52 % 30.0(L)  Platelets 150 - 400 K/uL 96(L)    No images are attached to the encounter.  No results found.  Assessment and plan- Patient is a 64 y.o. male diagnosed with stage IIIc colon cancer s/p 9 cycles of FOLFOX chemotherapy and history of smoldering myeloma who presents to symptom management clinic for numbness of hands & feet and cognitive changes.   1. Chemotherapy peripheral neuropathy- start duloxetine 30 mg once daily for 7 days then increase to 60 mg once daily if tolerating well.  Side effects and adverse reactions discussed. Refer to OT/Maureen Dupreez for evaluation and treatment.   2.  Cancer related/post-chemotherapy cognitive impairment- refer to care program. Encouraged exercise and meditation. Cognitive rehab may be useful. Encouraged patient to discuss symptoms with care program and maureen dupreez. Cymbalta may also help.   RTC in 4 weeks for re-evaluation. If symptoms worsen in the interim, contact clinic to be evaluated sooner.   Visit Diagnosis 1. Chemotherapy-induced neuropathy ([place])   2. Adjustment disorder with other symptom     Patient expressed understanding and was in agreement with this plan. He also  understands that He can call clinic at any time with any questions, concerns, or complaints.   Thank you for allowing me to participate in the care of this very pleasant patient.   Beckey Rutter, DNP, AGNP-[facility]  CC: Dr. Grayland Ormond

## 2020-06-28 NOTE — Patient Instructions (Signed)
As we discussed, you may notice some improvement in your symptoms with acupuncture. Below is the names of some local clinics that offer our cancer survivors a discounted rate of $40/session. You can contact them for an appointment at your convenience.  - [facility] and [facility]) or [phone] Phillip Heal)

## 2020-06-29 ENCOUNTER — Other Ambulatory Visit: Payer: Self-pay

## 2020-06-29 DIAGNOSIS — C184 Malignant neoplasm of transverse colon: Secondary | ICD-10-CM

## 2020-06-30 ENCOUNTER — Telehealth: Payer: Self-pay | Admitting: *Deleted

## 2020-06-30 NOTE — Telephone Encounter (Signed)
Call returned to patient and advised of [place] response and he states that he is all set for acupuncture and occupational therapy, PT as well

## 2020-06-30 NOTE — Telephone Encounter (Signed)
Please let patient know the 5 free sessions is just available to breast cancer survivors. Please let me know if he has further questions.

## 2020-06-30 NOTE — Telephone Encounter (Signed)
Patient called stating the information on the sheet he was given at appointment for Chiropractors has conflicting information on it and that he called one of the laces on the sheet and was told that he needs to be referred by [place] to get 5 free sessions. He requests a return call from Beckey Rutter, NP

## 2020-07-06 ENCOUNTER — Ambulatory Visit: Payer: BC Managed Care – PPO | Attending: Oncology | Admitting: Occupational Therapy

## 2020-07-06 ENCOUNTER — Other Ambulatory Visit: Payer: Self-pay

## 2020-07-06 ENCOUNTER — Inpatient Hospital Stay: Payer: BC Managed Care – PPO | Admitting: Occupational Therapy

## 2020-07-06 ENCOUNTER — Encounter: Payer: Self-pay | Admitting: Occupational Therapy

## 2020-07-06 DIAGNOSIS — M6281 Muscle weakness (generalized): Secondary | ICD-10-CM | POA: Diagnosis present

## 2020-07-06 DIAGNOSIS — R278 Other lack of coordination: Secondary | ICD-10-CM | POA: Diagnosis present

## 2020-07-06 NOTE — Therapy (Signed)
[place] MAIN [facility] SERVICES [address] [place], [name], [address] Phone: [phone]   Fax:  [phone]  Occupational Therapy Evaluation  Patient Details  Name: Chris George MRN: [id number] Date of Birth: 1956/06/30 Referring Provider (OT): Dr. Grayland Ormond   Encounter Date: 07/06/2020   OT End of Session - 07/06/20 1004    Visit Number 1    Number of Visits 24    Date for OT Re-Evaluation 09/28/20    Authorization Time Period Progress reporting period starting  07/06/2020    OT Start Time 0836    OT Stop Time 0940    OT Time Calculation (min) 64 min    Activity Tolerance Patient tolerated treatment well;Patient limited by fatigue;Treatment limited secondary to agitation    Behavior During Therapy [facility] for tasks assessed/performed           Past Medical History:  Diagnosis Date  . A-fib ([place])   . Anemia   . Arthritis    ankles  . Cancer ([place])    multiple myeloma  . 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[place]) 10/26/2019  . Cardiomyopathy ([place])   . CHF (congestive heart failure) ([place])   . Depression   . Dysrhythmia    atrial fib  . HLD (hyperlipidemia)   . Hypercholesteremia   . Hypertension   . Moderate tricuspid insufficiency   . Multiple myeloma ([place])    not being treated right now per pt 11/11/19  . Pneumonia   . Sleep apnea    No CPAP    Past Surgical History:  Procedure Laterality Date  . ATRIAL ABLATION SURGERY    . CARDIAC SURGERY     A-Fib Ablations  . COLONOSCOPY WITH PROPOFOL N/A 10/26/2019   Procedure: COLONOSCOPY WITH BIOPSY;  Surgeon: Lucilla Lame, MD;  Location: [place];  Service: Endoscopy;  Laterality: N/A;  . ESOPHAGOGASTRODUODENOSCOPY (EGD) WITH PROPOFOL N/A 10/26/2019   Procedure: ESOPHAGOGASTRODUODENOSCOPY (EGD) WITH BIOPSY;  Surgeon: Lucilla Lame, MD;  Location: [place];  Service: Endoscopy;  Laterality: N/A;  sleep apnea  . LAPAROSCOPIC PARTIAL COLECTOMY Right 11/17/2019   Procedure:  LAPAROSCOPIC PARTIAL COLECTOMY RIGHT EXTENDED;  Surgeon: Olean Ree, MD;  Location: [facility];  Service: General;  Laterality: Right;  . PORTACATH PLACEMENT N/A 12/28/2019   Procedure: INSERTION PORT-A-CATH Left subclavian;  Surgeon: Olean Ree, MD;  Location: [facility];  Service: General;  Laterality: N/A;    There were no vitals filed for this visit.   Subjective Assessment - 07/06/20 0844    Subjective  Pt. reports palying tennis weekly.    Patient is accompanied by: Family member    Pertinent History Pt. is a 64 y.o. male who presents wtih weakness, balance changes, and neuropathy in the bilateral hands secondary to chemotherapy treatments for Colon CA. Pt. has a history of Myeloma.    Currently in Pain? Yes    Pain Score 5    stomach   Pain Location Other (Comment)    Pain Descriptors / Indicators Aching;Other (Comment)    Pain Type Chronic pain             [facility] OT Assessment - 07/06/20 0846      Assessment   Medical Diagnosis Neuropathy  from Chemotherapy    Referring Provider (OT) Dr. Grayland Ormond    Onset Date/Surgical Date 05/28/20    Hand Dominance Right      Precautions   Precautions None      Restrictions   Weight Bearing Restrictions No  Balance Screen   Has the patient fallen in the past 6 months No    Has the patient had a decrease in activity level because of a fear of falling?  No    Is the patient reluctant to leave their home because of a fear of falling?  No      Home  Environment   Family/patient expects to be discharged to: Private residence    Living Arrangements Alone    Available Help at Discharge Family    Type of [place]   2nd floor   [place] One level    Bathroom Shower/Tub Tub/Shower unit;Curtain    Shower/tub characteristics Curtain    Research officer, political party Yes    Home Equipment None    Lives With Alone      Prior Function   Level of [place] Full time employment    Dietitian company with government agencies    Leisure Tennis, reading      ADL   Eating/Feeding Independent    Grooming Independent    Upper Body Bathing Independent    Lower Body Bathing Independent    Upper Body Dressing Increased time   buttoning   Lower Geophysicist/field seismologist - Counselling psychologist Independent      IADL   Prior Level of Function Shopping ndependent    Shopping Takes care of all shopping needs independently    Prior Level of B and E Does personal laundry completely;Maintains house alone or with occasional assistance    Prior Level of Function Meal Prep independent    Meal Prep Plans, prepares and serves adequate meals independently    Prior Level of [place] own vehicle    Prior Level of Function Medication Managment Independent    Medication Management Is responsible for taking medication in correct dosages at correct time    Prior Level of Function Therapist, sports financial matters independently (budgets, writes checks, pays rent, bills goes to bank), collects and keeps track of income      Written Expression   Dominant Hand Right      Vision - History   Baseline Vision Wears glasses all the time      Sensation   Light Touch Appears Intact    Stereognosis Appears Intact    Proprioception Appears Intact      Coordination   Right 9 Hole Peg Test 24    Left 9 Hole Peg Test 26      Strength   Overall Strength Within functional limits for tasks performed      Hand Function   Right Hand Grip (lbs) 103    Right Hand Lateral Pinch 23 lbs    Left Hand Grip (lbs) 84    Left Hand Lateral Pinch 21 lbs                           OT Education  - 07/06/20 1003    Person(s) Educated Patient    Methods Explanation    Comprehension Verbalized understanding;Returned demonstration               OT Long Term Goals - 07/06/20  [place] #1   Title Pt. will be able to independently, and efficiently fasten buttons on a shirt.    Baseline Eval: Pt. has difficulty completing buttons efficiently    Time 12    Period Weeks    Status New    Target Date 09/28/20      OT LONG TERM GOAL #2   Title Pt. will write a paragraph with 100% legibility efficiently    Baseline Eval: Pt. has difficulty    Time 12    Period Weeks    Status New    Target Date 09/28/20      OT LONG TERM GOAL #3   Title Pt. will independently, and efficiently remove items from his wallet.    Baseline Eval: Pt. has difficutly.    Time 12    Period Weeks    Status New    Target Date 09/28/20      OT LONG TERM GOAL #4   Title Pt. will independently type a paragraph efficiently.    Baseline Eval: Pt. has difficulty.    Time 12    Period Weeks    Status New    Target Date 09/28/20      OT LONG TERM GOAL #5   Title Pt. will improve FOTO score by 2 points to maximize functional independence during ADLs, and IADLs.    Baseline Eval: FOTO score: 77    Time 12    Period Weeks    Status New    Target Date 09/28/20                 Plan - 07/06/20 1007    Clinical Impression Statement Pt. is a 64 y.o. male who has a history has a new onset of weakness, and neuropathy from chemotherapy treatments for colon CA. Pt. presents with numbness, and tingling in his bilateral hand which limit his ability to complete, and participate in daily ADLs, and IADL tasks efficiently. Pt. will benefit from OT skilled services to work on improving [facility] skills in order to work towards improving, and maximizing independence with ADLs, and IADL tasks.    Occupational performance deficits (Please refer to evaluation for details): ADL's    Rehab Potential Good     Clinical Decision Making Several treatment options, min-mod task modification necessary    Modification or Assistance to Complete Evaluation  Min-Moderate modification of tasks or assist with assess necessary to complete eval    OT Frequency 1x / week    OT Duration 12 weeks    OT Treatment/Interventions Self-care/ADL training;Patient/family education;DME and/or AE instruction;Therapeutic exercise;Therapeutic activities    Consulted and Agree with Plan of Care Patient           Patient will benefit from skilled therapeutic intervention in order to improve the following deficits and impairments:           Visit Diagnosis: Muscle weakness (generalized)  Other lack of coordination    Problem List Patient Active Problem List   Diagnosis Date Noted  . Port-A-Cath in place   . Abnormal ECG 11/04/2019  . 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[place]) 10/27/2019  . Neoplasm of digestive system   . Intraoperative partial intestinal obstruction ([place])   . Acute esophagogastric ulcer   . Goals of care, counseling/discussion 05/18/2019  . Multiple myeloma ([place]) 05/17/2019  . Anemia 05/12/2019  . Arthritis of left ankle 02/05/2018  . Cardiomyopathy, idiopathic ([place]) 01/07/2018  . Enlarged heart 01/01/2017  .  Moderate tricuspid insufficiency 01/01/2017  . OSA (obstructive sleep apnea) 01/01/2017  . Chronic a-fib ([place]) 12/06/2015  . Pure hypercholesterolemia 02/09/2015  . Type 2 diabetes mellitus without complication ([place]) 16/57/9038  . Anticoagulant long-term use 04/29/2014  . Hyperlipidemia 04/29/2014  . Hypertension, essential, benign 04/26/2014    Harrel Carina, MS, OTR/L 07/06/2020, 1:32 PM  [place] MAIN [facility] SERVICES [address] [place], [name], [address] Phone: [phone]   Fax:  [phone]  Name: Chris George MRN: [id number] Date of Birth: 1955-12-12

## 2020-07-12 ENCOUNTER — Ambulatory Visit: Payer: BC Managed Care – PPO | Admitting: Occupational Therapy

## 2020-07-16 NOTE — Progress Notes (Signed)
  [place]  [phone] [phone]  ID: Chris George OB: 09-Jan-1956  MR#: [id number]  RSW#:[id number]  Patient Care Team: Sofie Hartigan, MD as PCP - General (Family Medicine) Lloyd Huger, MD as Consulting Physician (Oncology)   Lloyd Huger, MD   07/20/2020 11:49 AM     This encounter was created in error - please disregard.

## 2020-07-18 ENCOUNTER — Ambulatory Visit: Payer: BC Managed Care – PPO | Admitting: Surgery

## 2020-07-18 ENCOUNTER — Telehealth: Payer: Self-pay

## 2020-07-18 NOTE — Telephone Encounter (Signed)
Spoke with patient regarding scheduling with CARE program. Per patient, he would like to hold off for now until he finishes acupuncture and completes more appointments with OT. He asked if he could call back when he is ready, patient has phone number and was encouraged to call when comfortable to start.

## 2020-07-19 ENCOUNTER — Ambulatory Visit: Payer: BC Managed Care – PPO | Admitting: Occupational Therapy

## 2020-07-19 ENCOUNTER — Inpatient Hospital Stay: Payer: BC Managed Care – PPO | Admitting: Oncology

## 2020-07-19 ENCOUNTER — Inpatient Hospital Stay: Payer: BC Managed Care – PPO | Attending: Oncology

## 2020-07-19 DIAGNOSIS — D696 Thrombocytopenia, unspecified: Secondary | ICD-10-CM | POA: Insufficient documentation

## 2020-07-19 DIAGNOSIS — N289 Disorder of kidney and ureter, unspecified: Secondary | ICD-10-CM | POA: Insufficient documentation

## 2020-07-19 DIAGNOSIS — C9000 Multiple myeloma not having achieved remission: Secondary | ICD-10-CM | POA: Insufficient documentation

## 2020-07-19 DIAGNOSIS — C189 Malignant neoplasm of colon, unspecified: Secondary | ICD-10-CM | POA: Insufficient documentation

## 2020-07-19 DIAGNOSIS — D509 Iron deficiency anemia, unspecified: Secondary | ICD-10-CM | POA: Insufficient documentation

## 2020-07-19 DIAGNOSIS — G629 Polyneuropathy, unspecified: Secondary | ICD-10-CM | POA: Insufficient documentation

## 2020-07-19 DIAGNOSIS — D709 Neutropenia, unspecified: Secondary | ICD-10-CM | POA: Insufficient documentation

## 2020-07-20 ENCOUNTER — Ambulatory Visit: Payer: BC Managed Care – PPO | Admitting: Surgery

## 2020-07-24 ENCOUNTER — Other Ambulatory Visit: Payer: Self-pay | Admitting: Nurse Practitioner

## 2020-07-24 NOTE — Progress Notes (Signed)
Chris George  [phone] [phone]) [phone]  ID: ADONNIS SALCEDA OB: 1956-03-03  MR#: [id number]  DSK#:[id number]  Patient Care Team: Sofie Hartigan, MD as PCP - General (Family Medicine) Lloyd Huger, MD as Consulting Physician (Oncology) Anabel Bene, MD as Referring Physician (Neurology)  CHIEF COMPLAINT: Stage IIIc colon cancer, smoldering myeloma.  INTERVAL HISTORY: Patient returns to clinic today for repeat laboratory work and follow-up for his declining performance status and peripheral neuropathy.  Despite not having treatment since July, patient states his neuropathy is becoming worse.  He declines pain, but states the numbness makes him unsteady on his feet.  He also is anxious secondary to his financial situation.  He has no other neurologic complaints.  He has no chest pain, shortness of breath, cough, or hemoptysis.  He denies any nausea, vomiting, constipation, or diarrhea.  He has no melena or hematochezia.  He has no urinary complaints.  Patient feels generally terrible, but offers no further specific complaints today.  REVIEW OF SYSTEMS:   Review of Systems  Constitutional: Negative.  Negative for fever, malaise/fatigue and weight loss.  Respiratory: Negative.  Negative for cough, hemoptysis and shortness of breath.   Cardiovascular: Negative.  Negative for chest pain and leg swelling.  Gastrointestinal: Negative.  Negative for abdominal pain, blood in stool and melena.  Genitourinary: Negative.  Negative for hematuria.  Musculoskeletal: Negative.  Negative for back pain.  Skin: Negative.  Negative for rash.  Neurological: Positive for tingling and sensory change. Negative for dizziness, focal weakness, weakness and headaches.  Psychiatric/Behavioral: Positive for depression. The patient is nervous/anxious.     As per HPI. Otherwise, a complete review of systems is negative.  PAST MEDICAL HISTORY: Past Medical History:   Diagnosis Date  . A-fib ([place])   . Anemia   . Arthritis    ankles  . Cancer ([place])    multiple myeloma  . 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 Cardiomyopathy ([place])   . CHF (congestive heart failure) ([place])   . Depression   . Dysrhythmia    atrial fib  . HLD (hyperlipidemia)   . Hypercholesteremia   . Hypertension   . Moderate tricuspid insufficiency   . Multiple myeloma ([place])    not being treated right now per pt 11/11/19  . Pneumonia   . Sleep apnea    No CPAP    PAST SURGICAL HISTORY: Past Surgical History:  Procedure Laterality Date  . ATRIAL ABLATION SURGERY    . CARDIAC SURGERY     A-Fib Ablations  . COLONOSCOPY WITH PROPOFOL N/A 10/26/2019   Procedure: COLONOSCOPY WITH BIOPSY;  Surgeon: Lucilla Lame, MD;  Location: [place];  Service: Endoscopy;  Laterality: N/A;  . ESOPHAGOGASTRODUODENOSCOPY (EGD) WITH PROPOFOL N/A 10/26/2019   Procedure: ESOPHAGOGASTRODUODENOSCOPY (EGD) WITH BIOPSY;  Surgeon: Lucilla Lame, MD;  Location: [place];  Service: Endoscopy;  Laterality: N/A;  sleep apnea  . LAPAROSCOPIC PARTIAL COLECTOMY Right 11/17/2019   Procedure: LAPAROSCOPIC PARTIAL COLECTOMY RIGHT EXTENDED;  Surgeon: Olean Ree, MD;  Location: [facility];  Service: General;  Laterality: Right;  . PORTACATH PLACEMENT N/A 12/28/2019   Procedure: INSERTION PORT-A-CATH Left subclavian;  Surgeon: Olean Ree, MD;  Location: [facility];  Service: General;  Laterality: N/A;    FAMILY HISTORY: Family History  Problem Relation Age of Onset  . Healthy Mother     ADVANCED DIRECTIVES (Y/N):  N  HEALTH MAINTENANCE: Social History   Tobacco Use  . Smoking status: Never  Smoker  . Smokeless tobacco: Never Used  Vaping Use  . Vaping Use: Never used  Substance Use Topics  . Alcohol use: Not Currently  . Drug use: Never     Colonoscopy:  PAP:  Bone density:  Lipid panel:  No Known Allergies  Current Outpatient Medications  Medication Sig Dispense  Refill  . APPLE CIDER VINEGAR PO Take 30-45 mLs by mouth every other day.     Marland Kitchen aspirin 325 MG EC tablet Take 325 mg by mouth every other day. In the morning    . digoxin (DIGOX) 0.25 MG tablet Take 0.25 mg by mouth daily.     . DULoxetine (CYMBALTA) 60 MG capsule Take 1 capsule (60 mg total) by mouth daily. 30 capsule 0  . furosemide (LASIX) 80 MG tablet Take 80 mg by mouth 2 (two) times daily. Morning & late afternoon    . lidocaine-prilocaine (EMLA) cream Apply to affected area once 30 g 3  . Misc Natural Products (TART CHERRY ADVANCED PO) Take 60 mLs by mouth every other day.    . potassium bicarbonate (KLOR-CON/EF) 25 MEQ disintegrating tablet Take 25 mEq by mouth 4 (four) times a week. In the afternoon.    . prochlorperazine (COMPAZINE) 10 MG tablet Take 1 tablet (10 mg total) by mouth every 6 (six) hours as needed (Nausea or vomiting). 60 tablet 2   No current facility-administered medications for this visit.    OBJECTIVE: Vitals:   07/29/20 1432  BP: 107/65  Pulse: 65  Resp: 20  Temp: 97.6 F (36.4 C)  SpO2: 99%     Body mass index is 31.57 kg/m.    ECOG FS:1 - Symptomatic but completely ambulatory  General: Well-developed, well-nourished, no acute distress. Eyes: Pink conjunctiva, anicteric sclera. HEENT: Normocephalic, moist mucous membranes. Lungs: No audible wheezing or coughing. Heart: Regular rate and rhythm. Abdomen: Soft, nontender, no obvious distention. Musculoskeletal: No edema, cyanosis, or clubbing. Neuro: Alert, answering all questions appropriately. Cranial nerves grossly intact. Skin: No rashes or petechiae noted. Psych: Normal affect.  LAB RESULTS:  Lab Results  Component Value Date   NA 140 07/29/2020   K 4.2 07/29/2020   CL 104 07/29/2020   CO2 29 07/29/2020   GLUCOSE 105 (H) 07/29/2020   BUN 23 07/29/2020   CREATININE 1.39 (H) 07/29/2020   CALCIUM 7.7 (L) 07/29/2020   PROT 6.2 (L) 07/29/2020   ALBUMIN 2.6 (L) 07/29/2020   AST 41  07/29/2020   ALT 19 07/29/2020   ALKPHOS 145 (H) 07/29/2020   BILITOT 0.8 07/29/2020   GFRNONAA 53 (L) 07/29/2020   GFRAA >60 07/29/2020    Lab Results  Component Value Date   WBC 2.3 (L) 07/29/2020   NEUTROABS 1.4 (L) 07/29/2020   HGB 10.3 (L) 07/29/2020   HCT 31.5 (L) 07/29/2020   MCV 89.5 07/29/2020   PLT 80 (L) 07/29/2020   Lab Results  Component Value Date   IRON 41 (L) 05/11/2020   TIBC 323 05/11/2020   IRONPCTSAT 13 (L) 05/11/2020   Lab Results  Component Value Date   FERRITIN 86 05/11/2020     STUDIES: No results found.  ASSESSMENT: Stage IIIc colon cancer, smoldering myeloma.  PLAN:    1.  Stage IIIc colon cancer: Imaging and final pathology reviewed independently confirming stage of disease.  Patient completed cycle 9 of adjuvant FOLFOX on 06/03/2020.  Given his difficulties with treatment and declining performance status, treatment was discontinued altogether.  No further intervention is needed.  Patient will  require colonoscopy and restaging CT scan in the next 6 to 12 months.  Return to clinic in 4 weeks with repeat laboratory work and further evaluation.  2. Smoldering myeloma: Patient was initially diagnosed and treated in [place], [place] and treated with single agent Velcade in approximately 2013.  He was evaluated in clinic in July 2014 when he declined any maintenance treatment or referral for bone marrow transplant.  He was subsequently lost to follow-up.  His most recent SPEP revealed an M spike of 2.0 with an IgG predominance of 2949.  These are unchanged from 3 months prior.  He also has a suppressed IgA level.  Kappa/lambda light chain ratio has trended up slightly to 2.17.  He has mild renal insufficiency, but no other evidence of endorgan damage.  Metastatic bone survey on May 20, 2019 reviewed independently with no obvious bony lesions.  Bone marrow biopsy completed on May 28, 2019 revealed only 10 to 15% plasma cells with no clonality reported.   Cytogenetics were also reported as normal.  Repeat laboratory work from today is pending.  Return to clinic as above  3.  Iron deficiency anemia: Hemoglobin is trended up slightly to 10.3, monitor.  He last received IV Feraheme on October 06, 2019.   4.  Renal insufficiency: Chronic and unchanged.  Patient's creatinine is 1.39. 5.  Peripheral neuropathy: Significantly worse.  Patient also reports it is progressing which would be unusual given he has not had chemotherapy in 2 months.  He has been instructed to take gabapentin as prescribed and a referral was sent to neurology. 6.  Neutropenia: Chronic and unchanged. 7. Thrombocytopenia: Chronic and unchanged. 8.  Electrolytes: Potassium, magnesium and corrected calcium are essentially within normal limits. 9.  Financial concerns: Patient was given a referral to financial navigator.  Patient expressed understanding and was in agreement with this plan. He also understands that He can call clinic at any time with any questions, concerns, or complaints.    Lloyd Huger, MD   07/29/2020 4:31 PM

## 2020-07-25 ENCOUNTER — Telehealth: Payer: Self-pay | Admitting: *Deleted

## 2020-07-25 NOTE — Telephone Encounter (Signed)
We can go ahead and send neuro referral as well.  I'll still see him on 9/24/

## 2020-07-25 NOTE — Telephone Encounter (Signed)
Referral has been sent.

## 2020-07-25 NOTE — Telephone Encounter (Signed)
Patient called reporting that his neuropathy is worsening and that he has tried occupational therapy and acupuncture to no avail. He is asking that something be done and would like to be seen for it Please advise

## 2020-07-25 NOTE — Telephone Encounter (Signed)
Patient has f/u scheduled on 9/24.

## 2020-07-25 NOTE — Telephone Encounter (Signed)
Patient informed that a referral is being sent to neurology and that Dr Grayland Ormond wants to keep his appointment Friday with our office. He confirmed appointment

## 2020-07-26 ENCOUNTER — Ambulatory Visit: Payer: BC Managed Care – PPO | Admitting: Occupational Therapy

## 2020-07-29 ENCOUNTER — Encounter: Payer: Self-pay | Admitting: Oncology

## 2020-07-29 ENCOUNTER — Other Ambulatory Visit: Payer: Self-pay

## 2020-07-29 ENCOUNTER — Inpatient Hospital Stay: Payer: BC Managed Care – PPO

## 2020-07-29 ENCOUNTER — Inpatient Hospital Stay (HOSPITAL_BASED_OUTPATIENT_CLINIC_OR_DEPARTMENT_OTHER): Payer: BC Managed Care – PPO | Admitting: Oncology

## 2020-07-29 VITALS — BP 107/65 | HR 65 | Temp 97.6°F | Resp 20 | Wt 213.8 lb

## 2020-07-29 DIAGNOSIS — C189 Malignant neoplasm of colon, unspecified: Secondary | ICD-10-CM | POA: Diagnosis present

## 2020-07-29 DIAGNOSIS — C184 Malignant neoplasm of transverse colon: Secondary | ICD-10-CM

## 2020-07-29 DIAGNOSIS — D509 Iron deficiency anemia, unspecified: Secondary | ICD-10-CM | POA: Diagnosis not present

## 2020-07-29 DIAGNOSIS — G629 Polyneuropathy, unspecified: Secondary | ICD-10-CM | POA: Diagnosis not present

## 2020-07-29 DIAGNOSIS — C9000 Multiple myeloma not having achieved remission: Secondary | ICD-10-CM | POA: Diagnosis present

## 2020-07-29 DIAGNOSIS — N289 Disorder of kidney and ureter, unspecified: Secondary | ICD-10-CM | POA: Diagnosis not present

## 2020-07-29 DIAGNOSIS — D696 Thrombocytopenia, unspecified: Secondary | ICD-10-CM | POA: Diagnosis not present

## 2020-07-29 DIAGNOSIS — D709 Neutropenia, unspecified: Secondary | ICD-10-CM | POA: Diagnosis not present

## 2020-07-29 LAB — CBC WITH DIFFERENTIAL/PLATELET
Abs Immature Granulocytes: 0.01 10*3/uL (ref 0.00–0.07)
Basophils Absolute: 0 10*3/uL (ref 0.0–0.1)
Basophils Relative: 0 %
Eosinophils Absolute: 0.1 10*3/uL (ref 0.0–0.5)
Eosinophils Relative: 3 %
HCT: 31.5 % — ABNORMAL LOW (ref 39.0–52.0)
Hemoglobin: 10.3 g/dL — ABNORMAL LOW (ref 13.0–17.0)
Immature Granulocytes: 0 %
Lymphocytes Relative: 20 %
Lymphs Abs: 0.5 10*3/uL — ABNORMAL LOW (ref 0.7–4.0)
MCH: 29.3 pg (ref 26.0–34.0)
MCHC: 32.7 g/dL (ref 30.0–36.0)
MCV: 89.5 fL (ref 80.0–100.0)
Monocytes Absolute: 0.4 10*3/uL (ref 0.1–1.0)
Monocytes Relative: 16 %
Neutro Abs: 1.4 10*3/uL — ABNORMAL LOW (ref 1.7–7.7)
Neutrophils Relative %: 61 %
Platelets: 80 10*3/uL — ABNORMAL LOW (ref 150–400)
RBC: 3.52 MIL/uL — ABNORMAL LOW (ref 4.22–5.81)
RDW: 13.8 % (ref 11.5–15.5)
WBC: 2.3 10*3/uL — ABNORMAL LOW (ref 4.0–10.5)
nRBC: 0 % (ref 0.0–0.2)

## 2020-07-29 LAB — COMPREHENSIVE METABOLIC PANEL
ALT: 19 U/L (ref 0–44)
AST: 41 U/L (ref 15–41)
Albumin: 2.6 g/dL — ABNORMAL LOW (ref 3.5–5.0)
Alkaline Phosphatase: 145 U/L — ABNORMAL HIGH (ref 38–126)
Anion gap: 7 (ref 5–15)
BUN: 23 mg/dL (ref 8–23)
CO2: 29 mmol/L (ref 22–32)
Calcium: 7.7 mg/dL — ABNORMAL LOW (ref 8.9–10.3)
Chloride: 104 mmol/L (ref 98–111)
Creatinine, Ser: 1.39 mg/dL — ABNORMAL HIGH (ref 0.61–1.24)
GFR calc Af Amer: >60 mL/min (ref >60)
GFR calc non Af Amer: 53 mL/min — ABNORMAL LOW (ref >60)
Glucose, Bld: 105 mg/dL — ABNORMAL HIGH (ref 70–99)
Potassium: 4.2 mmol/L (ref 3.5–5.1)
Sodium: 140 mmol/L (ref 135–145)
Total Bilirubin: 0.8 mg/dL (ref 0.3–1.2)
Total Protein: 6.2 g/dL — ABNORMAL LOW (ref 6.5–8.1)

## 2020-07-29 LAB — MAGNESIUM: Magnesium: 1.7 mg/dL (ref 1.7–2.4)

## 2020-07-29 NOTE — Progress Notes (Signed)
Patient states today at appointment, he has been dealing with a lot of neuropathy in legs and calves. States since last chemo treatment neuropathy has gotten worse. He is currently unable to work due to neuropathy in hands and exercise due to neuropathy in legs. He is very depressed and struggling with money to pay medical bills right now. He also reports trouble sleeping. Patient would like to discuss getting a form to extend his fmla right now. States his job has shared with him that he has reached his deadline for time.

## 2020-07-30 LAB — CEA: CEA: 16.2 ng/mL — ABNORMAL HIGH (ref 0.0–4.7)

## 2020-07-31 LAB — IGG, IGA, IGM
IgA: 85 mg/dL (ref 61–437)
IgG (Immunoglobin G), Serum: 1782 mg/dL — ABNORMAL HIGH (ref 603–1613)
IgM (Immunoglobulin M), Srm: 44 mg/dL (ref 20–172)

## 2020-08-01 ENCOUNTER — Inpatient Hospital Stay: Payer: BC Managed Care – PPO | Admitting: Nurse Practitioner

## 2020-08-01 ENCOUNTER — Other Ambulatory Visit: Payer: Self-pay | Admitting: *Deleted

## 2020-08-01 DIAGNOSIS — C9000 Multiple myeloma not having achieved remission: Secondary | ICD-10-CM

## 2020-08-01 LAB — PROTEIN ELECTROPHORESIS, SERUM
A/G Ratio: 0.8 (ref 0.7–1.7)
Albumin ELP: 2.6 g/dL — ABNORMAL LOW (ref 2.9–4.4)
Alpha-1-Globulin: 0.3 g/dL (ref 0.0–0.4)
Alpha-2-Globulin: 0.5 g/dL (ref 0.4–1.0)
Beta Globulin: 1 g/dL (ref 0.7–1.3)
Gamma Globulin: 1.5 g/dL (ref 0.4–1.8)
Globulin, Total: 3.3 g/dL (ref 2.2–3.9)
M-Spike, %: 1.2 g/dL — ABNORMAL HIGH
Total Protein ELP: 5.9 g/dL — ABNORMAL LOW (ref 6.0–8.5)

## 2020-08-01 LAB — KAPPA/LAMBDA LIGHT CHAINS
Kappa free light chain: 75.3 mg/L — ABNORMAL HIGH (ref 3.3–19.4)
Kappa, lambda light chain ratio: 2.81 — ABNORMAL HIGH (ref 0.26–1.65)
Lambda free light chains: 26.8 mg/L — ABNORMAL HIGH (ref 5.7–26.3)

## 2020-08-02 ENCOUNTER — Ambulatory Visit: Payer: BC Managed Care – PPO | Admitting: Occupational Therapy

## 2020-08-03 ENCOUNTER — Encounter: Payer: Self-pay | Admitting: Occupational Therapy

## 2020-08-03 DIAGNOSIS — M6281 Muscle weakness (generalized): Secondary | ICD-10-CM

## 2020-08-03 NOTE — Therapy (Signed)
[place] MAIN [facility] SERVICES [address] [place], [name], [address] Phone: [phone]   Fax:  [phone]  August 03, 2020    No Recipients  Occupational Therapy Discharge Summary   Patient: Chris George MRN: [id number] Date of Birth: [birth year]  Diagnosis: Muscle weakness (generalized)  Referring Provider (OT): Dr. Grayland Ormond   The above patient had been seen in Occupational Therapy for 1 visit  The treatment consisted of  An initial evaluation. Pt. Did not return to the clinic to initiate, or participate in any treatment sessions. The patient is: unchanged  Subjective:  Pt. Participated in the initial OT evaluation only. Pt. Has not returned to the clinic. Will discharge OT services at this time.  Functional Status at Discharge:    OT Long Term Goals - 07/06/20 1320      OT LONG TERM GOAL #1   Title Pt. will be able to independently, and efficiently fasten buttons on a shirt.    Baseline Eval: Pt. has difficulty completing buttons efficiently    Time 12    Period Weeks    Status New    Target Date 09/28/20      OT LONG TERM GOAL #2   Title Pt. will write a paragraph with 100% legibility efficiently    Baseline Eval: Pt. has difficulty    Time 12    Period Weeks    Status New    Target Date 09/28/20      OT LONG TERM GOAL #3   Title Pt. will independently, and efficiently remove items from his wallet.    Baseline Eval: Pt. has difficutly.    Time 12    Period Weeks    Status New    Target Date 09/28/20      OT LONG TERM GOAL #4   Title Pt. will independently type a paragraph efficiently.    Baseline Eval: Pt. has difficulty.    Time 12    Period Weeks    Status New    Target Date 09/28/20      OT LONG TERM GOAL #5   Title Pt. will improve FOTO score by 2 points to maximize functional independence during ADLs, and IADLs.    Baseline Eval: FOTO score: 77    Time 12    Period Weeks    Status New     Target Date 09/28/20             Sincerely,  Harrel Carina, MS, OTR/L  CC No Recipients  [place] [employer] MAIN [facility] SERVICES [address] [place], [name], [address] Phone: [phone]   Fax:  [phone]  Patient: Chris George MRN: [id number] Date of Birth: 07-May-1956

## 2020-08-04 ENCOUNTER — Telehealth: Payer: Self-pay

## 2020-08-04 NOTE — Telephone Encounter (Signed)
Called Ct scheduling to verify patient appointment and other information patient requested.   Called patient back to inform them of time of CT and location and also provided instructions. Informed patient where he could pick up prep as well. Patient verbalized understanding and denied further questions.

## 2020-08-04 NOTE — Telephone Encounter (Signed)
Patient called and left message on voicemail concerning a recent ct appointment that was made. He also states he was told he had a neurology referral and no one has reached out to him regarding this appointment. He is very upset and feels we are ignoring his concerns and feels kept in the dark. He states no one told him he had a CT appointment. He states he is feeling worse and would like to speak to provider directly.   Found where neurology referral had been placed to [facility]. Called to verify if they had received referral on patient.  Spoke with [facility] she states they have tried to reach out to patient on 9/21 and left message for patient to call back.   Contacted patient and informed patient that [facility] had contacted him and left voicemail. Also provided patient with their phone number. Patient states he will not wait for them to call back, he will call them. Patient is still wanting to know about CT scan that is schedule on 10/5. He states no one contacted him regarding what he should do or if he needs to pick up any medication.

## 2020-08-09 ENCOUNTER — Ambulatory Visit: Payer: BC Managed Care – PPO | Admitting: Occupational Therapy

## 2020-08-09 ENCOUNTER — Other Ambulatory Visit: Payer: Self-pay

## 2020-08-09 ENCOUNTER — Ambulatory Visit
Admission: RE | Admit: 2020-08-09 | Discharge: 2020-08-09 | Disposition: A | Discharge status: Home / Self Care | Payer: BC Managed Care – PPO | Source: Ambulatory Visit | Attending: Oncology | Admitting: Oncology

## 2020-08-09 DIAGNOSIS — C9000 Multiple myeloma not having achieved remission: Secondary | ICD-10-CM | POA: Diagnosis not present

## 2020-08-09 MED ORDER — IOHEXOL 300 MG/ML  SOLN
100.0000 mL | Freq: Once | INTRAMUSCULAR | Status: AC | PRN
  Administered 2020-08-09: 100 mL via INTRAVENOUS

## 2020-08-15 ENCOUNTER — Telehealth: Payer: Self-pay | Admitting: *Deleted

## 2020-08-15 NOTE — Telephone Encounter (Signed)
Want to offer him [facility] either virtual or in person to discuss? He is scheduled to see Grayland Ormond for CT results & re-evaluation on 10/25. Thanks.

## 2020-08-15 NOTE — Telephone Encounter (Signed)
His neuropathy is getting worse despite discontinue chemo awhile ago. [facility] is good, but he really needs neuro.

## 2020-08-15 NOTE — Telephone Encounter (Signed)
I called patient and had lengthy conversation. He tried acupuncture. He admits to not following thru with OT. He has [facility] neurology appt on 10/26. He told me the MD appt was sheduled too far out after the CT scan. He is adamant he wants to see Dr Grayland Ormond in person. He declines a [facility] visit. He really wants to get CT results and discuss need or appropriateness for disability for his job . Pt acknowledges his anxiety re: feeling a loss of control and feelings of frustration. He immed diffused once appt was made with Dr Grayland Ormond for the end of this week.

## 2020-08-15 NOTE — Telephone Encounter (Signed)
Patient called reporting that his neuropathy is worse, not pain, but feeling and balance is off and that he is taking his medicine as ordered. He is asking if he needs to see his PCP or what to do. He is also asking when his next appointment is (08/29/20 @ 230) and for results of his CT last week.Please advise.  IMPRESSION: 1. Status post interval right hemicolectomy. Resection or resolution of previously described suspicious nodes within the transverse mesocolon. 2. Enlargement of a minimally enlarged retrocaval node. This could be reactive (related to cirrhosis) or represent isolated nodal metastasis. Recommend attention on follow-up. 3. Cirrhosis and portal venous hypertension. 4.  No acute process or evidence of metastatic disease in the chest. 5. Pulmonary artery enlargement suggests pulmonary arterial hypertension. 6. Apparent nodularity along the umbilicus, likely postoperative. 7. Borderline ascending aortic dilatation. Recommend annual imaging followup by CTA or MRA. This recommendation follows 2010 ACCF/AHA/AATS/ACR/ASA/SCA/SCAI/SIR/STS/SVM Guidelines for the Diagnosis and Management of Patients with Thoracic Aortic Disease. Circulation. 2010; 121: O478-S128. Aortic aneurysm NOS (ICD10-I71.9)   Electronically Signed   By: Abigail Miyamoto M.D.   On: 08/09/2020 12:24

## 2020-08-15 NOTE — Telephone Encounter (Signed)
I see that patient had a neurology referral. He was set up for OT but looks like he only went to initial assessment visit and never went back.

## 2020-08-16 ENCOUNTER — Ambulatory Visit: Payer: BC Managed Care – PPO | Admitting: Occupational Therapy

## 2020-08-18 ENCOUNTER — Encounter: Payer: Self-pay | Admitting: *Deleted

## 2020-08-19 ENCOUNTER — Inpatient Hospital Stay: Payer: BC Managed Care – PPO | Attending: Oncology

## 2020-08-19 ENCOUNTER — Other Ambulatory Visit: Payer: Self-pay

## 2020-08-19 ENCOUNTER — Inpatient Hospital Stay (HOSPITAL_BASED_OUTPATIENT_CLINIC_OR_DEPARTMENT_OTHER): Payer: BC Managed Care – PPO | Admitting: Oncology

## 2020-08-19 ENCOUNTER — Other Ambulatory Visit: Payer: Self-pay | Admitting: *Deleted

## 2020-08-19 ENCOUNTER — Encounter: Payer: Self-pay | Admitting: Oncology

## 2020-08-19 VITALS — BP 122/70 | HR 46 | Temp 96.5°F | Resp 18 | Wt 210.0 lb

## 2020-08-19 DIAGNOSIS — D509 Iron deficiency anemia, unspecified: Secondary | ICD-10-CM | POA: Diagnosis not present

## 2020-08-19 DIAGNOSIS — E876 Hypokalemia: Secondary | ICD-10-CM | POA: Insufficient documentation

## 2020-08-19 DIAGNOSIS — N289 Disorder of kidney and ureter, unspecified: Secondary | ICD-10-CM | POA: Insufficient documentation

## 2020-08-19 DIAGNOSIS — C9000 Multiple myeloma not having achieved remission: Secondary | ICD-10-CM | POA: Diagnosis not present

## 2020-08-19 DIAGNOSIS — C184 Malignant neoplasm of transverse colon: Secondary | ICD-10-CM | POA: Diagnosis not present

## 2020-08-19 DIAGNOSIS — T451X5A Adverse effect of antineoplastic and immunosuppressive drugs, initial encounter: Secondary | ICD-10-CM

## 2020-08-19 DIAGNOSIS — G629 Polyneuropathy, unspecified: Secondary | ICD-10-CM | POA: Diagnosis not present

## 2020-08-19 DIAGNOSIS — C189 Malignant neoplasm of colon, unspecified: Secondary | ICD-10-CM | POA: Insufficient documentation

## 2020-08-19 DIAGNOSIS — D696 Thrombocytopenia, unspecified: Secondary | ICD-10-CM | POA: Insufficient documentation

## 2020-08-19 DIAGNOSIS — Z79899 Other long term (current) drug therapy: Secondary | ICD-10-CM | POA: Insufficient documentation

## 2020-08-19 DIAGNOSIS — D709 Neutropenia, unspecified: Secondary | ICD-10-CM | POA: Insufficient documentation

## 2020-08-19 DIAGNOSIS — Z95828 Presence of other vascular implants and grafts: Secondary | ICD-10-CM

## 2020-08-19 LAB — COMPREHENSIVE METABOLIC PANEL
ALT: 18 U/L (ref 0–44)
AST: 37 U/L (ref 15–41)
Albumin: 2.5 g/dL — ABNORMAL LOW (ref 3.5–5.0)
Alkaline Phosphatase: 135 U/L — ABNORMAL HIGH (ref 38–126)
Anion gap: 5 (ref 5–15)
BUN: 20 mg/dL (ref 8–23)
CO2: 29 mmol/L (ref 22–32)
Calcium: 7.6 mg/dL — ABNORMAL LOW (ref 8.9–10.3)
Chloride: 105 mmol/L (ref 98–111)
Creatinine, Ser: 1.48 mg/dL — ABNORMAL HIGH (ref 0.61–1.24)
GFR, Estimated: 49 mL/min — ABNORMAL LOW (ref >60)
Glucose, Bld: 90 mg/dL (ref 70–99)
Potassium: 3.4 mmol/L — ABNORMAL LOW (ref 3.5–5.1)
Sodium: 139 mmol/L (ref 135–145)
Total Bilirubin: 1 mg/dL (ref 0.3–1.2)
Total Protein: 6 g/dL — ABNORMAL LOW (ref 6.5–8.1)

## 2020-08-19 LAB — CBC WITH DIFFERENTIAL/PLATELET
Abs Immature Granulocytes: 0 10*3/uL (ref 0.00–0.07)
Basophils Absolute: 0 10*3/uL (ref 0.0–0.1)
Basophils Relative: 0 %
Eosinophils Absolute: 0.1 10*3/uL (ref 0.0–0.5)
Eosinophils Relative: 2 %
HCT: 30.1 % — ABNORMAL LOW (ref 39.0–52.0)
Hemoglobin: 9.9 g/dL — ABNORMAL LOW (ref 13.0–17.0)
Immature Granulocytes: 0 %
Lymphocytes Relative: 19 %
Lymphs Abs: 0.4 10*3/uL — ABNORMAL LOW (ref 0.7–4.0)
MCH: 29.2 pg (ref 26.0–34.0)
MCHC: 32.9 g/dL (ref 30.0–36.0)
MCV: 88.8 fL (ref 80.0–100.0)
Monocytes Absolute: 0.4 10*3/uL (ref 0.1–1.0)
Monocytes Relative: 16 %
Neutro Abs: 1.4 10*3/uL — ABNORMAL LOW (ref 1.7–7.7)
Neutrophils Relative %: 63 %
Platelets: 97 10*3/uL — ABNORMAL LOW (ref 150–400)
RBC: 3.39 MIL/uL — ABNORMAL LOW (ref 4.22–5.81)
RDW: 13.4 % (ref 11.5–15.5)
WBC: 2.3 10*3/uL — ABNORMAL LOW (ref 4.0–10.5)
nRBC: 0 % (ref 0.0–0.2)

## 2020-08-19 MED ORDER — SODIUM CHLORIDE 0.9% FLUSH
10.0000 mL | INTRAVENOUS | Status: DC | PRN
  Administered 2020-08-19: 10 mL via INTRAVENOUS
  Filled 2020-08-19: qty 10

## 2020-08-19 MED ORDER — HEPARIN SOD (PORK) LOCK FLUSH 100 UNIT/ML IV SOLN
INTRAVENOUS | Status: AC
  Filled 2020-08-19: qty 5

## 2020-08-19 MED ORDER — HEPARIN SOD (PORK) LOCK FLUSH 100 UNIT/ML IV SOLN
500.0000 [IU] | Freq: Once | INTRAVENOUS | Status: AC
  Administered 2020-08-19: 500 [IU] via INTRAVENOUS
  Filled 2020-08-19: qty 5

## 2020-08-19 NOTE — Progress Notes (Signed)
Patient here today for follow up, discuss CT results. Patient also reports continued neuropathy.

## 2020-08-19 NOTE — Progress Notes (Signed)
[place]  Telephone:([phone]) [phone] Fax:([phone]  ID: Chris George OB: 11/09/1955  MR#: [id number]  BMW#:[id number]  Patient Care Team: Sofie Hartigan, MD as PCP - General (Family Medicine) Lloyd Huger, MD as Consulting Physician (Oncology) Anabel Bene, MD as Referring Physician (Neurology)  CHIEF COMPLAINT: Stage IIIc colon cancer, smoldering myeloma.  INTERVAL HISTORY: Patient returns to clinic today for repeat laboratory work, discussion of his imaging results, and further evaluation.  He continues to have peripheral neuropathy but seems to be worse in his hands and his feet, but still is affecting his quality of life.  He denies any pain, but persistent numbness and tingling.  He continues to anxious regarding his financial situation.  He has no other neurologic complaints.  He has no chest pain, shortness of breath, cough, or hemoptysis.  He denies any nausea, vomiting, constipation, or diarrhea.  He has no melena or hematochezia.  He has no urinary complaints.  Patient offers no further specific complaints today.  REVIEW OF SYSTEMS:   Review of Systems  Constitutional: Negative.  Negative for fever, malaise/fatigue and weight loss.  Respiratory: Negative.  Negative for cough, hemoptysis and shortness of breath.   Cardiovascular: Negative.  Negative for chest pain and leg swelling.  Gastrointestinal: Negative.  Negative for abdominal pain, blood in stool and melena.  Genitourinary: Negative.  Negative for hematuria.  Musculoskeletal: Negative.  Negative for back pain.  Skin: Negative.  Negative for rash.  Neurological: Positive for tingling and sensory change. Negative for dizziness, focal weakness, weakness and headaches.  Psychiatric/Behavioral: Positive for depression. The patient is nervous/anxious.     As per HPI. Otherwise, a complete review of systems is negative.  PAST MEDICAL HISTORY: Past Medical History:  Diagnosis Date  .  A-fib ([place])   . Anemia   . Arthritis    ankles  . Cancer ([place])    multiple myeloma  . 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[place]) 10/26/2019  . Cardiomyopathy ([place])   . CHF (congestive heart failure) ([place])   . Depression   . Dysrhythmia    atrial fib  . HLD (hyperlipidemia)   . Hypercholesteremia   . Hypertension   . Moderate tricuspid insufficiency   . Multiple myeloma ([place])    not being treated right now per pt 11/11/19  . Pneumonia   . Sleep apnea    No CPAP    PAST SURGICAL HISTORY: Past Surgical History:  Procedure Laterality Date  . ATRIAL ABLATION SURGERY    . CARDIAC SURGERY     A-Fib Ablations  . COLONOSCOPY WITH PROPOFOL N/A 10/26/2019   Procedure: COLONOSCOPY WITH BIOPSY;  Surgeon: Lucilla Lame, MD;  Location: [facility];  Service: Endoscopy;  Laterality: N/A;  . ESOPHAGOGASTRODUODENOSCOPY (EGD) WITH PROPOFOL N/A 10/26/2019   Procedure: ESOPHAGOGASTRODUODENOSCOPY (EGD) WITH BIOPSY;  Surgeon: Lucilla Lame, MD;  Location: [facility];  Service: Endoscopy;  Laterality: N/A;  sleep apnea  . LAPAROSCOPIC PARTIAL COLECTOMY Right 11/17/2019   Procedure: LAPAROSCOPIC PARTIAL COLECTOMY RIGHT EXTENDED;  Surgeon: Olean Ree, MD;  Location: [facility];  Service: General;  Laterality: Right;  . PORTACATH PLACEMENT N/A 12/28/2019   Procedure: INSERTION PORT-A-CATH Left subclavian;  Surgeon: Olean Ree, MD;  Location: [facility];  Service: General;  Laterality: N/A;    FAMILY HISTORY: Family History  Problem Relation Age of Onset  . Healthy Mother     ADVANCED DIRECTIVES (Y/N):  N  HEALTH MAINTENANCE: Social History   Tobacco Use  . Smoking status:  Never Smoker  . Smokeless tobacco: Never Used  Vaping Use  . Vaping Use: Never used  Substance Use Topics  . Alcohol use: Not Currently  . Drug use: Never     Colonoscopy:  PAP:  Bone density:  Lipid panel:  No Known Allergies  Current Outpatient Medications  Medication Sig Dispense Refill  . APPLE CIDER  VINEGAR PO Take 30-45 mLs by mouth every other day.     Marland Kitchen aspirin 325 MG EC tablet Take 325 mg by mouth every other day. In the morning    . digoxin (DIGOX) 0.25 MG tablet Take 0.25 mg by mouth daily.     . DULoxetine (CYMBALTA) 60 MG capsule Take 1 capsule (60 mg total) by mouth daily. 30 capsule 0  . furosemide (LASIX) 80 MG tablet Take 80 mg by mouth 2 (two) times daily. Morning & late afternoon    . lidocaine-prilocaine (EMLA) cream Apply to affected area once 30 g 3  . Misc Natural Products (TART CHERRY ADVANCED PO) Take 60 mLs by mouth every other day.    . potassium bicarbonate (KLOR-CON/EF) 25 MEQ disintegrating tablet Take 25 mEq by mouth 4 (four) times a week. In the afternoon.    . prochlorperazine (COMPAZINE) 10 MG tablet Take 1 tablet (10 mg total) by mouth every 6 (six) hours as needed (Nausea or vomiting). 60 tablet 2   No current facility-administered medications for this visit.   Facility-Administered Medications Ordered in Other Visits  Medication Dose Route Frequency Provider Last Rate Last Admin  . sodium chloride flush (NS) 0.9 % injection 10 mL  10 mL Intravenous PRN Lloyd Huger, MD   10 mL at 08/19/20 1013    OBJECTIVE: Vitals:   08/19/20 1030  BP: 122/70  Pulse: (!) 46  Resp: 18  Temp: (!) 96.5 F (35.8 C)     Body mass index is 31.01 kg/m.    ECOG FS:1 - Symptomatic but completely ambulatory  General: Well-developed, well-nourished, no acute distress. Eyes: Pink conjunctiva, anicteric sclera. HEENT: Normocephalic, moist mucous membranes. Lungs: No audible wheezing or coughing. Heart: Regular rate and rhythm. Abdomen: Soft, nontender, no obvious distention. Musculoskeletal: No edema, cyanosis, or clubbing. Neuro: Alert, answering all questions appropriately. Cranial nerves grossly intact. Skin: No rashes or petechiae noted. Psych: Normal affect.  LAB RESULTS:  Lab Results  Component Value Date   NA 139 08/19/2020   K 3.4 (L) 08/19/2020   CL  105 08/19/2020   CO2 29 08/19/2020   GLUCOSE 90 08/19/2020   BUN 20 08/19/2020   CREATININE 1.48 (H) 08/19/2020   CALCIUM 7.6 (L) 08/19/2020   PROT 6.0 (L) 08/19/2020   ALBUMIN 2.5 (L) 08/19/2020   AST 37 08/19/2020   ALT 18 08/19/2020   ALKPHOS 135 (H) 08/19/2020   BILITOT 1.0 08/19/2020   GFRNONAA 49 (L) 08/19/2020   GFRAA >60 07/29/2020    Lab Results  Component Value Date   WBC 2.3 (L) 08/19/2020   NEUTROABS 1.4 (L) 08/19/2020   HGB 9.9 (L) 08/19/2020   HCT 30.1 (L) 08/19/2020   MCV 88.8 08/19/2020   PLT 97 (L) 08/19/2020   Lab Results  Component Value Date   IRON 41 (L) 05/11/2020   TIBC 323 05/11/2020   IRONPCTSAT 13 (L) 05/11/2020   Lab Results  Component Value Date   FERRITIN 86 05/11/2020     STUDIES: CT CHEST ABDOMEN PELVIS W CONTRAST  Result Date: 08/09/2020 CLINICAL DATA:  Multiple myeloma with chemotherapy in 2013. Colon  cancer with chemotherapy. Partial colectomy 11/17/2019. Last chemotherapy 6 weeks ago. Weakness since treatments. EXAM: CT CHEST, ABDOMEN, AND PELVIS WITH CONTRAST TECHNIQUE: Multidetector CT imaging of the chest, abdomen and pelvis was performed following the standard protocol during bolus administration of intravenous contrast. CONTRAST:  134m OMNIPAQUE IOHEXOL 300 MG/ML  SOLN COMPARISON:  11/11/2019 FINDINGS: CT CHEST FINDINGS Cardiovascular: Bovine arch. Aortic atherosclerosis. Borderline ascending aortic dilatation, including at 4.0 cm on 32/2, similar. Tortuous thoracic aorta. Moderate cardiomegaly with right atrial enlargement. Pulmonary artery enlargement, outflow tract 4.0 cm Left Port-A-Cath tip at mid SVC. Mediastinum/Nodes: No supraclavicular adenopathy. No mediastinal or hilar adenopathy. Lungs/Pleura: No pleural fluid. Scattered right greater than left tiny pulmonary nodules, similar and favored to represent calcified granulomas. Musculoskeletal: No acute osseous abnormality. CT ABDOMEN PELVIS FINDINGS Hepatobiliary: Moderate  cirrhosis, without focal liver lesion. Normal gallbladder, without biliary ductal dilatation. Pancreas: Normal, without mass or ductal dilatation. Spleen: Developing splenomegaly at 14.6 cm greatest transverse dimension versus 12.1 cm on the prior. Adrenals/Urinary Tract: Normal adrenal glands. Normal kidneys, without hydronephrosis. Normal urinary bladder. Stomach/Bowel: Normal stomach, without wall thickening. Interval right hemicolectomy. Normal small bowel. Vascular/Lymphatic: Aortic atherosclerosis. Patent portal and splenic veins. Portal venous hypertension, with a recannulized paraumbilical vein and left pelvic varices as on prior. A retrocaval node measures 1.1 cm on 78/2 versus 9 mm on the prior exam (when remeasured). The nodes within the transverse mesocolon have either resolved or been resected. No pelvic sidewall adenopathy. Reproductive: Mild prostatomegaly. Other: No significant free fluid. No evidence of omental or peritoneal disease. Mild nodularity about the left side of the umbilicus, including on 928/0at 8 mm. This is at the site of a small periumbilical fat containing hernia on the prior. Musculoskeletal: No worrisome osseous lesion. IMPRESSION: 1. Status post interval right hemicolectomy. Resection or resolution of previously described suspicious nodes within the transverse mesocolon. 2. Enlargement of a minimally enlarged retrocaval node. This could be reactive (related to cirrhosis) or represent isolated nodal metastasis. Recommend attention on follow-up. 3. Cirrhosis and portal venous hypertension. 4.  No acute process or evidence of metastatic disease in the chest. 5. Pulmonary artery enlargement suggests pulmonary arterial hypertension. 6. Apparent nodularity along the umbilicus, likely postoperative. 7. Borderline ascending aortic dilatation. Recommend annual imaging followup by CTA or MRA. This recommendation follows 2010 ACCF/AHA/AATS/ACR/ASA/SCA/SCAI/SIR/STS/SVM Guidelines for the  Diagnosis and Management of Patients with Thoracic Aortic Disease. Circulation. 2010; 121:: K349-Z791 Aortic aneurysm NOS (ICD10-I71.9) Electronically Signed   By: KAbigail MiyamotoM.D.   On: 08/09/2020 12:24    ASSESSMENT: Stage IIIc colon cancer, smoldering myeloma.  PLAN:    1.  Stage IIIc colon cancer: Imaging and final pathology reviewed independently confirming stage of disease.  Patient completed cycle 9 of adjuvant FOLFOX on 06/03/2020.  Given his difficulties with treatment and declining performance status, treatment was discontinued altogether.  No further intervention is needed.  Patient will require colonoscopy and restaging CT scan in the next 6 to 12 months.  Despite an increased CEA, CT scan results from August 09, 2020 reviewed independently and report as above with no obvious evidence of recurrent or progressive disease.  Patient noted to have a minimally enlarged retrocaval node of unclear etiology.  Repeat imaging and lab work in 3 months with follow-up 1 to 2 days later.    2. Smoldering myeloma: [place]  He was evaluated in clinic in July 2014 when  he declined any maintenance treatment or referral for bone marrow transplant.  He was subsequently lost to follow-up.  His most recent SPEP revealed an M spike of 2.0 with an IgG predominance of 2949.  These are unchanged from 3 months prior.  He also has a suppressed IgA level.  Kappa/lambda light chain ratio has trended up slightly to 2.17.  He has mild renal insufficiency, but no other evidence of endorgan damage.  Metastatic bone survey on May 20, 2019 reviewed independently with no obvious bony lesions.  Bone marrow biopsy completed on May 28, 2019 revealed only 10 to 15% plasma cells with no clonality reported.  Cytogenetics were also reported as normal.  Patient's most recent M spike has trended down to 1.2.  IgG  component has also trended down to 1782.  No intervention is needed at this time.  Repeat laboratory work in 3 months.  3.  Iron deficiency anemia: Chronic and unchanged.  Patient's hemoglobin is 9.9 today.  He last received IV Feraheme on October 06, 2019.   4.  Renal insufficiency: Chronic and unchanged.  Patient's creatinine is 1.48. 5.  Peripheral neuropathy: Patient does not describe pain, only numbness.  Worse in his hands than his feet.  Continue gabapentin as prescribed.  Keep follow-up appointment with neurology as scheduled.  Patient was also given a referral to Occupational Therapy for further evaluation and improvement of his hand dexterity. 6.  Neutropenia: Chronic and unchanged. 7. Thrombocytopenia: Mildly improved, monitor. 8.  Hypokalemia: Potassium mildly decreased, monitor. 9.  Financial concerns: Patient was previously given a referral to financial navigator.  Patient expressed understanding and was in agreement with this plan. He also understands that He can call clinic at any time with any questions, concerns, or complaints.    Lloyd Huger, MD   08/19/2020 12:06 PM

## 2020-08-20 LAB — CEA: CEA: 13 ng/mL — ABNORMAL HIGH (ref 0.0–4.7)

## 2020-08-20 LAB — IGG, IGA, IGM
IgA: 67 mg/dL (ref 61–437)
IgG (Immunoglobin G), Serum: 1554 mg/dL (ref 603–1613)
IgM (Immunoglobulin M), Srm: 44 mg/dL (ref 20–172)

## 2020-08-22 LAB — PROTEIN ELECTROPHORESIS, SERUM
A/G Ratio: 0.9 (ref 0.7–1.7)
Albumin ELP: 2.6 g/dL — ABNORMAL LOW (ref 2.9–4.4)
Alpha-1-Globulin: 0.3 g/dL (ref 0.0–0.4)
Alpha-2-Globulin: 0.5 g/dL (ref 0.4–1.0)
Beta Globulin: 0.9 g/dL (ref 0.7–1.3)
Gamma Globulin: 1.3 g/dL (ref 0.4–1.8)
Globulin, Total: 2.9 g/dL (ref 2.2–3.9)
M-Spike, %: 1.1 g/dL — ABNORMAL HIGH
Total Protein ELP: 5.5 g/dL — ABNORMAL LOW (ref 6.0–8.5)

## 2020-08-22 LAB — KAPPA/LAMBDA LIGHT CHAINS
Kappa free light chain: 57.5 mg/L — ABNORMAL HIGH (ref 3.3–19.4)
Kappa, lambda light chain ratio: 2.53 — ABNORMAL HIGH (ref 0.26–1.65)
Lambda free light chains: 22.7 mg/L (ref 5.7–26.3)

## 2020-08-23 ENCOUNTER — Ambulatory Visit: Payer: BC Managed Care – PPO | Admitting: Occupational Therapy

## 2020-08-24 ENCOUNTER — Other Ambulatory Visit: Payer: Self-pay

## 2020-08-24 ENCOUNTER — Inpatient Hospital Stay: Payer: BC Managed Care – PPO | Admitting: Occupational Therapy

## 2020-08-24 DIAGNOSIS — G620 Drug-induced polyneuropathy: Secondary | ICD-10-CM

## 2020-08-24 DIAGNOSIS — T451X5A Adverse effect of antineoplastic and immunosuppressive drugs, initial encounter: Secondary | ICD-10-CM

## 2020-08-24 NOTE — Therapy (Signed)
[facility] [address] [place], [address] [place], [place], [address] Phone: [phone]   Fax:  [phone]  Occupational Therapy Screen  Patient Details  Name: Chris George MRN: [id number] Date of Birth: 1955-12-15 Referring Provider (OT): Dr. Orlie Dakin   Encounter Date: 08/24/2020   OT End of Session - 08/24/20 1452    Visit Number 0           Past Medical History:  Diagnosis Date  . A-fib ([facility])   . Anemia   . Arthritis    ankles  . Cancer ([facility])    multiple myeloma  . 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[facility]) 10/26/2019  . Cardiomyopathy ([facility])   . CHF (congestive heart failure) ([facility])   . Depression   . Dysrhythmia    atrial fib  . HLD (hyperlipidemia)   . Hypercholesteremia   . Hypertension   . Moderate tricuspid insufficiency   . Multiple myeloma ([facility])    not being treated right now per pt 11/11/19  . Pneumonia   . Sleep apnea    No CPAP    Past Surgical History:  Procedure Laterality Date  . ATRIAL ABLATION SURGERY    . CARDIAC SURGERY     A-Fib Ablations  . COLONOSCOPY WITH PROPOFOL N/A 10/26/2019   Procedure: COLONOSCOPY WITH BIOPSY;  Surgeon: [name], MD;  Location: [facility];  Service: Endoscopy;  Laterality: N/A;  . ESOPHAGOGASTRODUODENOSCOPY (EGD) WITH PROPOFOL N/A 10/26/2019   Procedure: ESOPHAGOGASTRODUODENOSCOPY (EGD) WITH BIOPSY;  Surgeon: [name], MD;  Location: [facility];  Service: Endoscopy;  Laterality: N/A;  sleep apnea  . LAPAROSCOPIC PARTIAL COLECTOMY Right 11/17/2019   Procedure: LAPAROSCOPIC PARTIAL COLECTOMY RIGHT EXTENDED;  Surgeon: [name], MD;  Location: [facility];  Service: General;  Laterality: Right;  . PORTACATH PLACEMENT N/A 12/28/2019   Procedure: INSERTION PORT-A-CATH Left subclavian;  Surgeon: [name], MD;  Location: [facility];  Service: General;  Laterality: N/A;    ASSESSMENT FROM DR Orlie Dakin LAST VISIT: -last week  ASSESSMENT: Stage IIIc colon  cancer, smoldering myeloma.  PLAN:    1.  Stage IIIc colon cancer: Imaging and final pathology reviewed independently confirming stage of disease.  Patient completed cycle 9 of adjuvant FOLFOX on 06/03/2020.  Given his difficulties with treatment and declining performance status, treatment was discontinued altogether.  No further intervention is needed.  Patient will require colonoscopy and restaging CT scan in the next 6 to 12 months.  Despite an increased CEA, CT scan results from August 09, 2020 reviewed independently and report as above with no obvious evidence of recurrent or progressive disease.  Patient noted to have a minimally enlarged retrocaval node of unclear etiology.  Repeat imaging and lab work in 3 months with follow-up 1 to 2 days later.    2. Smoldering myeloma: Patient was initially diagnosed and treated in [place], [place] and treated with single agent Velcade in approximately 2013.  He was evaluated in clinic in July 2014 when he declined any maintenance treatment or referral for bone marrow transplant.  He was subsequently lost to follow-up.  His most recent SPEP revealed an M spike of 2.0 with an IgG predominance of 2949.  These are unchanged from 3 months prior.  He also has a suppressed IgA level.  Kappa/lambda light chain ratio has trended up slightly to 2.17.  He has mild renal insufficiency, but no other evidence of endorgan damage.  Metastatic bone survey on May 20, 2019 reviewed independently  with no obvious bony lesions.  Bone marrow biopsy completed on May 28, 2019 revealed only 10 to 15% plasma cells with no clonality reported.  Cytogenetics were also reported as normal.  Patient's most recent M spike has trended down to 1.2.  IgG component has also trended down to 1782.  No intervention is needed at this time.  Repeat laboratory work in 3 months.  3.  Iron deficiency anemia: Chronic and unchanged.  Patient's hemoglobin is 9.9 today.  He last received IV Feraheme on  October 06, 2019.   4.  Renal insufficiency: Chronic and unchanged.  Patient's creatinine is 1.48. 5.  Peripheral neuropathy: Patient does not describe pain, only numbness.  Worse in his hands than his feet.  Continue gabapentin as prescribed.  Keep follow-up appointment with neurology as scheduled.  Patient was also given a referral to Occupational Therapy for further evaluation and improvement of his hand dexterity. 6.  Neutropenia: Chronic and unchanged. 7. Thrombocytopenia: Mildly improved, monitor. 8.  Hypokalemia: Potassium mildly decreased, monitor. 9.  Financial concerns: Patient was previously given a referral to financial navigator   OT SCREEN 08/24/2020  There were no vitals filed for this visit.   Subjective Assessment - 08/24/20 1450    Subjective  The neuropathy in my hands were worse than my feet -but now I have neuropathy in my lower legs - do not play tennis now for about 82months, has hard time working on computer, dropping things - not pain more numbness - cannot sleep -wake up and then tired during day          Pt report numbness - not really pain - in the morning feet worse than later in days Wear shoes most of the time Dropping things, hard type on computer for his work - 19 yrs at his work - thinking of retiring - his going to be 57 Loves to play tennis - competitive in [place] - that is his social outlet too -and then [employer] staying asleep- when waking up - hard time to fall asleep again - tired in day  And stressed   Upon assessment of hands - decrease light sensation in DIP of all digits only - and some fingers worse than others - with R hand better than L Palm to middle phalanges normal sensation- can hold and use his tennis racquet  Decrease FMC and manipulation of objects - pick up and retrieve objects from palm <> fingers tips Pt ed on some activities he can do at home - small object - identify with fingers and close eyes Object with different  weight, and density - feel , pick up and close eyes -identify Opposition to all digits- fast as he can - and tapping of digits- but use rhythm first  And then manipulation of objects from palm <> finger tips- 3 -and retrieve one at time    Balance BERG test pt score 50/56- low risk for fall - done great -except for high level activities - like heel to toe and standing one leg Pt report numbness in front 1/2 of feet and going up his lower legs Pt is tennis player - pt to work on heel and toes lift, side stepping, one leg stand - first holding onto counter and then gradually decrease holding on Pt can hit some tennis balls with some of his tennis buddies - but one on one - and no running to much yet- start with 15 -20 min and gradually increase time -  and movement Interested in [place] program to do some conditioning- has some history of A-fib too   Talked with him about getting back to exercise /tennis to help with sleeping, and stress  and provided him some info on  Mindfulness for helping with sleep/stress  Pt can contact me if want to follow up in 3-4 wks                                     Patient will benefit from skilled therapeutic intervention in order to improve the following deficits and impairments:           Visit Diagnosis: Chemotherapy-induced neuropathy [facility])    Problem List Patient Active Problem List   Diagnosis Date Noted  . Port-A-Cath in place   . Abnormal ECG 11/04/2019  . 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[place]) 10/27/2019  . Neoplasm of digestive system   . Intraoperative partial intestinal obstruction ([place])   . Acute esophagogastric ulcer   . Goals of care, counseling/discussion 05/18/2019  . Multiple myeloma ([place]) 05/17/2019  . Anemia 05/12/2019  . Arthritis of left ankle 02/05/2018  . Cardiomyopathy, idiopathic ([place]) 01/07/2018  . Enlarged heart 01/01/2017  . Moderate tricuspid insufficiency 01/01/2017  . OSA (obstructive  sleep apnea) 01/01/2017  . Chronic a-fib ([place]) 12/06/2015  . Pure hypercholesterolemia 02/09/2015  . Type 2 diabetes mellitus without complication ([place]) 36/11/6578  . Anticoagulant long-term use 04/29/2014  . Hyperlipidemia 04/29/2014  . Hypertension, essential, benign 04/26/2014    Rosalyn Gess OTR/L,CLT 08/24/2020, 2:52 PM  [facility] [address] [place], [place], [name], [address] Phone: [phone]   Fax:  [phone]  Name: LYELL CLUGSTON MRN: [id number] Date of Birth: 05-11-1956

## 2020-08-25 ENCOUNTER — Other Ambulatory Visit: Payer: Self-pay

## 2020-08-25 DIAGNOSIS — G620 Drug-induced polyneuropathy: Secondary | ICD-10-CM

## 2020-08-29 ENCOUNTER — Other Ambulatory Visit: Payer: BC Managed Care – PPO

## 2020-08-29 ENCOUNTER — Ambulatory Visit: Payer: BC Managed Care – PPO | Admitting: Oncology

## 2020-08-30 ENCOUNTER — Ambulatory Visit: Payer: BC Managed Care – PPO | Admitting: Occupational Therapy

## 2020-08-30 DIAGNOSIS — R200 Anesthesia of skin: Secondary | ICD-10-CM | POA: Insufficient documentation

## 2020-08-30 DIAGNOSIS — G629 Polyneuropathy, unspecified: Secondary | ICD-10-CM | POA: Insufficient documentation

## 2020-08-30 DIAGNOSIS — R262 Difficulty in walking, not elsewhere classified: Secondary | ICD-10-CM | POA: Insufficient documentation

## 2020-09-06 ENCOUNTER — Ambulatory Visit: Payer: BC Managed Care – PPO | Admitting: Occupational Therapy

## 2020-09-07 ENCOUNTER — Encounter: Payer: Self-pay | Admitting: Emergency Medicine

## 2020-09-07 ENCOUNTER — Emergency Department
Admission: EM | Admit: 2020-09-07 | Discharge: 2020-09-07 | Disposition: A | Discharge status: Home / Self Care | Payer: BC Managed Care – PPO | Attending: Emergency Medicine | Admitting: Emergency Medicine

## 2020-09-07 ENCOUNTER — Emergency Department: Payer: BC Managed Care – PPO

## 2020-09-07 ENCOUNTER — Other Ambulatory Visit: Payer: Self-pay

## 2020-09-07 DIAGNOSIS — M10061 Idiopathic gout, right knee: Secondary | ICD-10-CM | POA: Insufficient documentation

## 2020-09-07 DIAGNOSIS — Z7901 Long term (current) use of anticoagulants: Secondary | ICD-10-CM | POA: Diagnosis not present

## 2020-09-07 DIAGNOSIS — Z8579 Personal history of other malignant neoplasms of lymphoid, hematopoietic and related tissues: Secondary | ICD-10-CM | POA: Insufficient documentation

## 2020-09-07 DIAGNOSIS — I110 Hypertensive heart disease with heart failure: Secondary | ICD-10-CM | POA: Diagnosis not present

## 2020-09-07 DIAGNOSIS — Z7982 Long term (current) use of aspirin: Secondary | ICD-10-CM | POA: Insufficient documentation

## 2020-09-07 DIAGNOSIS — M25561 Pain in right knee: Secondary | ICD-10-CM | POA: Diagnosis present

## 2020-09-07 DIAGNOSIS — E119 Type 2 diabetes mellitus without complications: Secondary | ICD-10-CM | POA: Insufficient documentation

## 2020-09-07 DIAGNOSIS — Z85038 Personal history of other malignant neoplasm of large intestine: Secondary | ICD-10-CM | POA: Insufficient documentation

## 2020-09-07 DIAGNOSIS — M109 Gout, unspecified: Secondary | ICD-10-CM

## 2020-09-07 DIAGNOSIS — I509 Heart failure, unspecified: Secondary | ICD-10-CM | POA: Diagnosis not present

## 2020-09-07 MED ORDER — CELECOXIB 50 MG PO CAPS: 50.0000 mg | ORAL_CAPSULE | Freq: Two times a day (BID) | ORAL | 0 refills | Status: AC

## 2020-09-07 NOTE — ED Triage Notes (Signed)
Pt comes into the ED via POV c/o right knee pain.  Pt denies any injury or falls.  Pt does have h/o gout.  PT states his knee is swollen but he is ambulatory to triage.

## 2020-09-07 NOTE — ED Provider Notes (Signed)
[facility] Emergency Department Provider Note  ____________________________________________   First MD Initiated Contact with Patient 09/07/20 1740     (approximate)  I have reviewed the triage vital signs and the nursing notes.   HISTORY  Chief Complaint Knee Pain  HPI Chris George is a 64 y.o. male who presents to the emergency department for evaluation of right knee pain and swelling.  The patient denies any falls, trauma or injury mechanism.  He states that 4 to 5 days ago, he just noted that the knee began to swell and hurt.  He states the pain is worse in the morning, but improves as he gets mobile throughout the day.  He notes that he has a history of gout of the bilateral ankles with the last incident approximately 3 years ago.  He medicated with home remedy of cherry tart juice as well as apple cider vinegar regimen every other day.  He stopped this regimen approximately 3 months ago during the process of treatment for his colon cancer.  He believes that this is return of gout due to him not following his regimen.  He denies any fevers, denies any other symptoms consistent with infection.         Past Medical History:  Diagnosis Date  . A-fib ([place])   . Anemia   . Arthritis    ankles  . Cancer ([place])    multiple myeloma  . 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[place]) 10/26/2019  . Cardiomyopathy ([place])   . CHF (congestive heart failure) ([place])   . Depression   . Dysrhythmia    atrial fib  . HLD (hyperlipidemia)   . Hypercholesteremia   . Hypertension   . Moderate tricuspid insufficiency   . Multiple myeloma ([place])    not being treated right now per pt 11/11/19  . Pneumonia   . Sleep apnea    No CPAP    Patient Active Problem List   Diagnosis Date Noted  . Port-A-Cath in place   . Abnormal ECG 11/04/2019  . 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[place]) 10/27/2019  . Neoplasm of digestive system   . Intraoperative partial intestinal obstruction ([place])   .  Acute esophagogastric ulcer   . Goals of care, counseling/discussion 05/18/2019  . Multiple myeloma ([place]) 05/17/2019  . Anemia 05/12/2019  . Arthritis of left ankle 02/05/2018  . Cardiomyopathy, idiopathic ([place]) 01/07/2018  . Enlarged heart 01/01/2017  . Moderate tricuspid insufficiency 01/01/2017  . OSA (obstructive sleep apnea) 01/01/2017  . Chronic a-fib ([place]) 12/06/2015  . Pure hypercholesterolemia 02/09/2015  . Type 2 diabetes mellitus without complication ([place]) 76/81/1572  . Anticoagulant long-term use 04/29/2014  . Hyperlipidemia 04/29/2014  . Hypertension, essential, benign 04/26/2014    Past Surgical History:  Procedure Laterality Date  . ATRIAL ABLATION SURGERY    . CARDIAC SURGERY     A-Fib Ablations  . COLONOSCOPY WITH PROPOFOL N/A 10/26/2019   Procedure: COLONOSCOPY WITH BIOPSY;  Surgeon: Lucilla Lame, MD;  Location: [facility];  Service: Endoscopy;  Laterality: N/A;  . ESOPHAGOGASTRODUODENOSCOPY (EGD) WITH PROPOFOL N/A 10/26/2019   Procedure: ESOPHAGOGASTRODUODENOSCOPY (EGD) WITH BIOPSY;  Surgeon: Lucilla Lame, MD;  Location: [facility];  Service: Endoscopy;  Laterality: N/A;  sleep apnea  . LAPAROSCOPIC PARTIAL COLECTOMY Right 11/17/2019   Procedure: LAPAROSCOPIC PARTIAL COLECTOMY RIGHT EXTENDED;  Surgeon: Olean Ree, MD;  Location: [facility];  Service: General;  Laterality: Right;  . PORTACATH PLACEMENT N/A 12/28/2019   Procedure: INSERTION PORT-A-CATH Left subclavian;  Surgeon:  Olean Ree, MD;  Location: [facility];  Service: General;  Laterality: N/A;    Prior to Admission medications   Medication Sig Start Date End Date Taking? Authorizing Provider  APPLE CIDER VINEGAR PO Take 30-45 mLs by mouth every other day.     [provider]  aspirin 325 MG EC tablet Take 325 mg by mouth every other day. In the morning    [provider]  celecoxib (CELEBREX) 50 MG capsule Take 1 capsule (50 mg total) by mouth 2 (two) times daily for 7  days. 09/07/20 09/14/20  Marlana Salvage, PA  digoxin (DIGOX) 0.25 MG tablet Take 0.25 mg by mouth daily.     [provider]  DULoxetine (CYMBALTA) 60 MG capsule Take 1 capsule (60 mg total) by mouth daily. 07/28/20   Verlon Au, NP  furosemide (LASIX) 80 MG tablet Take 80 mg by mouth 2 (two) times daily. Morning & late afternoon    [provider]  lidocaine-prilocaine (EMLA) cream Apply to affected area once 12/02/19   Lloyd Huger, MD  Misc Natural Products (TART CHERRY ADVANCED PO) Take 60 mLs by mouth every other day.    [provider]  potassium bicarbonate (KLOR-CON/EF) 25 MEQ disintegrating tablet Take 25 mEq by mouth 4 (four) times a week. In the afternoon.    [provider]  prochlorperazine (COMPAZINE) 10 MG tablet Take 1 tablet (10 mg total) by mouth every 6 (six) hours as needed (Nausea or vomiting). 12/02/19   Lloyd Huger, MD    Allergies Patient has no known allergies.  Family History  Problem Relation Age of Onset  . Healthy Mother     Social History Social History   Tobacco Use  . Smoking status: Never Smoker  . Smokeless tobacco: Never Used  Vaping Use  . Vaping Use: Never used  Substance Use Topics  . Alcohol use: Not Currently  . Drug use: Never    Review of Systems Constitutional: No fever/chills Eyes: No visual changes. ENT: No sore throat. Cardiovascular: Denies chest pain. Respiratory: Denies shortness of breath. Gastrointestinal: No abdominal pain.  No nausea, no vomiting.  No diarrhea.  No constipation. Genitourinary: Negative for dysuria. Musculoskeletal: + Right knee pain, right knee swelling, negative for back pain. Skin: Negative for rash. Neurological: Negative for headaches, focal weakness or numbness.   ____________________________________________   PHYSICAL EXAM:  VITAL SIGNS: ED Triage Vitals  Enc Vitals Group     BP 09/07/20 1652 131/66     Pulse Rate 09/07/20 1652 68      Resp 09/07/20 1652 18     Temp 09/07/20 1652 98.5 F (36.9 C)     Temp Source 09/07/20 1652 Oral     SpO2 09/07/20 1652 100 %     Weight 09/07/20 1653 208 lb (94.3 kg)     Height 09/07/20 1653 _0  (1.753 m)     Head Circumference --      Peak Flow --      Pain Score 09/07/20 1653 8     Pain Loc --      Pain Edu? --      Excl. in [place]? --    Constitutional: Alert and oriented. Well appearing and in no acute distress. Eyes: Conjunctivae are normal.  Head: Atraumatic. Nose: No congestion/rhinnorhea. Mouth/Throat: Mucous membranes are moist.   Neck: No stridor.   Cardiovascular: Normal rate, regular rhythm. Good peripheral circulation. Respiratory: Normal respiratory effort.  No retractions.  Musculoskeletal: There  is obvious effusion about the right knee.  Patient maintains active range of motion from 0 to approximately 95 to 100 degrees only limited by pain.  Tender diffusely across the anterior aspect of the knee, no tenderness to the MCL or LCL region or posterior knee.  McMurray's is negative.  Knee is mildly warm to the touch but no erythema noted. Neurologic:  Normal speech and language. No gross focal neurologic deficits are appreciated. No gait instability. Skin:  Skin is warm, dry and intact. No rash noted. Psychiatric: Mood and affect are normal. Speech and behavior are normal.  ____________________________________________  RADIOLOGY I, Marlana Salvage, personally viewed and evaluated these images (plain radiographs) as part of my medical decision making, as well as reviewing the written report by the radiologist.  ED provider interpretation: Joint effusion noted, no acute fracture identified.  Official radiology report(s): DG Knee Complete 4 Views Right  Result Date: 09/07/2020 CLINICAL DATA:  Right knee pain, no known injury, initial encounter EXAM: RIGHT KNEE - COMPLETE 4+ VIEW COMPARISON:  None. FINDINGS: No acute fracture or dislocation is noted. Moderate  suprapatellar effusion is seen. No other soft tissue abnormality is noted. IMPRESSION: Joint effusion without acute bony abnormality. Electronically Signed   By: Inez Catalina M.D.   On: 09/07/2020 17:34    ____________________________________________   INITIAL IMPRESSION / ASSESSMENT AND PLAN / ED COURSE  As part of my medical decision making, I reviewed the following data within the [place] [facility] notes reviewed and incorporated, Radiograph reviewed of the right knee and Notes from prior ED visits        Patient is a 65 year old male who presents emergency department for evaluation of right knee swelling and pain for the last 4 to 5 days without any trauma or injury.  See HPI for further details.  Physical exam does reveal an obvious effusion as well as some limits to the range of motion secondary to pain.  Otherwise exam grossly unremarkable.  X-rays redemonstrate this effusion without any acute bony abnormality.  Patient does have a history of gout he believes this could be related.  Discussed with the patient that his symptoms, history, and exam could fit the clinical picture of gout, however differentials include infection, internal derangement or soft tissue injury.  At this time, feel the patient is low risk for infection as it is not erythematous and he maintains active range of motion of the knee and he denies fever.  Discussed that definitive diagnosis for his knee would likely require a arthrocentesis, however given the high suspicion for gout and low suspicion for infection, felt that it is reasonable to begin with gout treatment and watch this very closely.  The patient is amenable with this and does not want to proceed with arthrocentesis today.  The patient was prescribed a 5-day course of Celebrex to decrease risk of GI complication given recent colon cancer diagnosis.  The patient was advised to closely watch this knee and his symptoms and to return to the emergency  department for any fevers, worsening of his range of motion or worsening of his swelling and pain.  The patient agrees to this and and is stable at this time for outpatient therapy.      ____________________________________________   FINAL CLINICAL IMPRESSION(S) / ED DIAGNOSES  Final diagnoses:  Acute pain of right knee  Acute gout of right knee, unspecified cause     ED Discharge Orders  Ordered    celecoxib (CELEBREX) 50 MG capsule  2 times daily        09/07/20 1816          *Please note:  Chris George was evaluated in Emergency Department on 09/07/2020 for the symptoms described in the history of present illness. He was evaluated in the context of the global COVID-19 pandemic, which necessitated consideration that the patient might be at risk for infection with the SARS-CoV-2 virus that causes COVID-19. Institutional protocols and algorithms that pertain to the evaluation of patients at risk for COVID-19 are in a state of rapid change based on information released by regulatory bodies including the CDC and federal and state organizations. These policies and algorithms were followed during the patient's care in the ED.  Some ED evaluations and interventions may be delayed as a result of limited staffing during and the pandemic.*   Note:  This document was prepared using Dragon voice recognition software and may include unintentional dictation errors.    Marlana Salvage, PA 09/07/20 2219    Nance Pear, MD 09/07/20 [id number]

## 2020-09-13 ENCOUNTER — Ambulatory Visit: Payer: BC Managed Care – PPO | Admitting: Occupational Therapy

## 2020-09-15 ENCOUNTER — Telehealth: Payer: Self-pay | Admitting: *Deleted

## 2020-09-15 NOTE — Telephone Encounter (Signed)
Patient called requesting a return call to discuss his papers for work and that he is still unable to work at full capacity [phone]

## 2020-09-15 NOTE — Telephone Encounter (Signed)
Tillie Rung I hope you don't mind getting in touch with him tomorrow,

## 2020-09-16 NOTE — Telephone Encounter (Signed)
Call returned to patient, will update FMLA forms and email copy to patient. Patient gave me updated email address: [email or web address]

## 2020-09-20 ENCOUNTER — Ambulatory Visit: Payer: BC Managed Care – PPO | Admitting: Occupational Therapy

## 2020-09-23 NOTE — Patient Instructions (Addendum)
Access Code: [email or web address] URL: [email or web address] Date: 09/27/2020 Prepared by: Roxana Hires  Exercises Tandem Stance in Corner - 2 x daily - 7 x weekly - 3 x 30s with each foot forward hold Heel Toe Raises with Counter Support as Needed - 2 x daily - 7 x weekly - 2 sets - 10 reps - 3s hold hold Standing Single Leg Stance with Counter Support - 2 x daily - 7 x weekly - 3 x 30s with each foot forward hold Sit to Stand without Arm Support - 1 x daily - 7 x weekly - 2 sets - 10 reps

## 2020-09-27 ENCOUNTER — Ambulatory Visit: Payer: BC Managed Care – PPO | Admitting: Occupational Therapy

## 2020-09-27 ENCOUNTER — Other Ambulatory Visit: Payer: Self-pay

## 2020-09-27 ENCOUNTER — Ambulatory Visit: Payer: BC Managed Care – PPO | Attending: Oncology

## 2020-09-27 DIAGNOSIS — R278 Other lack of coordination: Secondary | ICD-10-CM

## 2020-09-27 DIAGNOSIS — M6281 Muscle weakness (generalized): Secondary | ICD-10-CM | POA: Diagnosis present

## 2020-09-27 NOTE — Therapy (Signed)
[facility] [address] [place], [place], [address] Phone: [phone]   Fax:  [phone]  Physical Therapy Evaluation  Patient Details  Name: Chris George MRN: [id number] Date of Birth: September 22, 1956 Referring Provider (PT): Dr. Orlie Dakin   Encounter Date: 09/27/2020   PT End of Session - 09/27/20 0812    Visit Number 1    Number of Visits 17    Date for PT Re-Evaluation 11/22/20    Authorization Type eval: 09/27/20    PT Start Time 0810    PT Stop Time 0900    PT Time Calculation (min) 50 min    Equipment Utilized During Treatment Gait belt    Activity Tolerance Patient tolerated treatment well    Behavior During Therapy [facility] for tasks assessed/performed           Past Medical History:  Diagnosis Date  . A-fib (HCC)   . Anemia   . Arthritis    ankles  . Cancer (HCC)    multiple myeloma  . 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HCC) 10/26/2019  . Cardiomyopathy (HCC)   . CHF (congestive heart failure) (HCC)   . Depression   . Dysrhythmia    atrial fib  . HLD (hyperlipidemia)   . Hypercholesteremia   . Hypertension   . Moderate tricuspid insufficiency   . Multiple myeloma (HCC)    not being treated right now per pt 11/11/19  . Pneumonia   . Sleep apnea    No CPAP    Past Surgical History:  Procedure Laterality Date  . ATRIAL ABLATION SURGERY    . CARDIAC SURGERY     A-Fib Ablations  . COLONOSCOPY WITH PROPOFOL N/A 10/26/2019   Procedure: COLONOSCOPY WITH BIOPSY;  Surgeon: [name], MD;  Location: [facility];  Service: Endoscopy;  Laterality: N/A;  . ESOPHAGOGASTRODUODENOSCOPY (EGD) WITH PROPOFOL N/A 10/26/2019   Procedure: ESOPHAGOGASTRODUODENOSCOPY (EGD) WITH BIOPSY;  Surgeon: [name], MD;  Location: [facility];  Service: Endoscopy;  Laterality: N/A;  sleep apnea  . LAPAROSCOPIC PARTIAL COLECTOMY Right 11/17/2019   Procedure: LAPAROSCOPIC PARTIAL COLECTOMY RIGHT EXTENDED;  Surgeon: [name], MD;  Location: [facility];  Service: General;  Laterality: Right;  . PORTACATH PLACEMENT N/A 12/28/2019   Procedure: INSERTION PORT-A-CATH Left subclavian;  Surgeon: [name], MD;  Location: [facility];  Service: General;  Laterality: N/A;    There were no vitals filed for this visit.    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 He has a history of stage IIIc colon cancer. Per medical record he completed cycle 9 of adjuvant FOLFOX on 06/03/2020.  Given his difficulties with treatment and declining performance status, treatment was discontinued altogether.  No further intervention is needed per oncology note. He also has a history of smoldering myeloma, initially diagnosed and treated in [place], [place] and treated with single agent Velcade in approximately 2013. He was evaluated in clinic in July 2014 when he declined any maintenance treatment or referral for bone marrow transplant.  He was subsequently lost to follow-up. He also has a history of iron deficiency anemia which is chronic and unchanged, monitored by oncology. He last received IV Feraheme on October 06, 2019. Patient's main goal is he would like to be able to get back out to play tennis.    Limitations Walking;Other (comment)   Leisure activities, specifically playing tennis   Diagnostic tests See history    Patient Stated Goals Pt would like to be able to get back to playing competitive tennis    Currently in Pain? Other (Comment)   Unrelated to current episode             [facility] PT Assessment - 09/27/20 0828      Assessment   Medical Diagnosis Neuropathy/chemotherapy    Referring Provider (PT) Dr. Grayland Ormond    Onset Date/Surgical Date  05/28/20    Hand Dominance Right    Next MD Visit In a few weeks    Prior Therapy No PT for this issue      Precautions   Precautions Fall      Restrictions   Weight Bearing Restrictions No      Balance Screen   Has the patient fallen in the past 6 months No    Has the patient had a decrease in activity level because of a fear of falling?  Yes    Is the patient reluctant to leave their home because of a fear of falling?  No      Home Environment   Living Environment Private residence    Living Arrangements Alone    Available Help at Discharge Family    Type of [place] to enter    Entrance Stairs-Number of Steps 20    Entrance Stairs-Rails Right;Left;Cannot reach both    [place] One level    [place] None      Prior Function   Level of Independence Independent    Vocation Full time employment;Other (comment)   Currently on [place], currently on [place], reading      Cognition   Overall Cognitive Status Within Functional Limits for tasks assessed      Standardized Balance Assessment   Standardized Balance Assessment Berg Balance Test;Dynamic Gait Index      Berg Balance Test   Sit to Stand Able to stand without using hands and stabilize independently    Standing Unsupported Able to stand safely 2 minutes    Sitting with Back Unsupported but Feet Supported on Floor or Stool Able to sit safely and securely 2 minutes    Stand to Sit Sits safely with minimal use of hands    Transfers Able to transfer safely, minor use of hands    Standing Unsupported with Eyes Closed Able to stand 10 seconds safely    Standing Unsupported with Feet Together Able to place feet together independently and stand 1 minute safely    From Standing, Reach Forward with Outstretched Arm Can reach confidently >25 cm (10")    From Standing Position, Pick up Object from Floor Able to pick up shoe safely and  easily    From Standing Position, Turn to Look Behind Over each Shoulder Looks behind from both sides and weight shifts well    Turn 360 Degrees Able to turn 360 degrees safely in 4 seconds or less    Standing Unsupported, Alternately Place Feet on Step/Stool Able to stand independently and safely and complete 8 steps in 20 seconds    Standing Unsupported, One Foot in Front Able to plae foot ahead of the other independently and hold  30 seconds    Standing on One Leg Able to lift leg independently and hold equal to or more than 3 seconds    Total Score 53      Dynamic Gait Index   Level Surface Normal    Change in Gait Speed Normal    Gait with Horizontal Head Turns Normal    Gait with Vertical Head Turns Mild Impairment    Gait and Pivot Turn Normal    Step Over Obstacle Normal    Step Around Obstacles Normal    Steps Mild Impairment    Total Score 22                SUBJECTIVE Chief complaint: Onset: 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 He has a history of stage IIIc colon cancer. Per medical record he completed cycle 9 of adjuvant FOLFOX on 06/03/2020.  Given his difficulties with treatment and declining performance status, treatment was discontinued altogether.  No further intervention is needed per oncology note. He also has a history of smoldering myeloma, initially diagnosed and treated in [place], [place] and treated with single agent Velcade in approximately 2013. He was evaluated in clinic in July 2014 when he declined any maintenance treatment or referral for bone marrow transplant.  He was subsequently lost to follow-up. He also has a history of iron deficiency anemia which is chronic and unchanged, monitored by oncology. He last received IV Feraheme on October 06, 2019. Patient's main goal is he would like to be able to get back out to play tennis.   Recent changes in overall health/medication: No Directional pattern for falls: None Prior history of physical therapy for balance: None Follow-up appointment with MD: None scheduled Red flags (bowel/bladder changes, saddle paresthesia, personal history of cancer, chills/fever, night sweats, unrelenting pain) Negative   OBJECTIVE  MUSCULOSKELETAL: Tremor: Absent Bulk: Normal Tone: Normal, no clonus  Posture Mild forward head posture with rounded shoulders but otherwise no gross deficits noted  Gait Wide base gait noted but otherwise no gross abnormalities. Speed WNL  Strength R/L 5/5 Hip flexion  Strong hip abduction/adduction in sitting; 5/5 Knee extension 5/5 Knee flexion (sitting) 5/5 Ankle Plantarflexion Strong active ankle dorsiflexion No focal weakness noted in UE;  NEUROLOGICAL:  Mental Status Patient is oriented to person, place and time.  Recent memory is intact.  Remote memory is intact.  Attention span and concentration are intact.  Expressive speech is intact.  Patient's fund of knowledge is within normal limits for educational level.  Cranial Nerves Deferred  Sensation Pt with neuropathy from mid calf down to feet, worse in the AM. UE is in the tips of the fingers.  Proprioception and hot/cold testing deferred on this date  Reflexes Deferred  Coordination/Cerebellar Deferred   FUNCTIONAL OUTCOME MEASURES   Results Comments  BERG 53/56 Mild deficits  DGI 22/24 WNL  30s Sit to Stand 13 reps Cut-off 14  5TSTS 12.9 seconds WNL  2 Minute Walk Test Deferred Deferred  10 Meter Gait Speed Self-selected: 7.1s = 1.41 m/s; WNL  ABC Scale 63.125% Below cut-off  FOTO 53  Predicted improvement to 62    POSTURAL CONTROL TESTS   Modified Clinical Test of Sensory Interaction for Balance    (CTSIB):  CONDITION TIME STRATEGY SWAY  Eyes  open, firm surface 30 seconds  ankle 1+  Eyes closed, firm surface 30 seconds ankle 2+  Eyes open, foam surface 30 seconds ankle 2+  Eyes closed, foam surface 30 seconds ankle 3+         Objective measurements completed on examination: See above findings.      TREATMENT   Neuromuscular Re-education  Tandem Stance in Corner x 30s with each foot forward; Heel Toe Raises with no UE support 3s hold x 10 each Standing Single Leg Stance with no UE support x 30s each; Reviewed HEP and pt provided handout with instructions about how to perform safely;         PT Education - 09/27/20 0812    Education Details Plan of care and HEP    Person(s) Educated Patient    Methods Explanation;Handout    Comprehension Verbalized understanding            PT Short Term Goals - 09/27/20 0815      PT SHORT TERM GOAL #1   Title Pt will be independent with HEP in order to improve strength and balance in order to decrease fall risk and improve function at home and work.    Time 4    Period Weeks    Status New    Target Date 10/25/20             PT Long Term Goals - 09/27/20 0826      PT LONG TERM GOAL #1   Title Pt will decrease FOTO score to at least 62 points in order to demonstrate clinically significant improvement in function.    Baseline 09/27/20: 53    Time 8    Period Weeks    Status New    Target Date 11/22/20      PT LONG TERM GOAL #2   Title Pt will improve ABC by at least 13% in order to demonstrate clinically significant improvement in balance confidence.    Baseline 09/27/20: 63.125%    Time 8    Period Weeks    Status New    Target Date 11/22/20      PT LONG TERM GOAL #3   Title Pt will decrease 30s Sit to Stand to at least 16 or more in order to demonstrate clinically significant improvement in LE strength/endurance    Baseline 09/27/20: 13    Time 8    Period Weeks    Status New    Target Date 11/22/20      PT LONG TERM GOAL #4   Title Pt will improve single leg balance to at  least 15s bilaterally in order to demonstrate clinically significant improvement in balance and help pt return to tennis.    Baseline 09/27/20: LLE: 3-4s, RLE: 8-9s;    Time 8    Period Weeks    Status New    Target Date 11/22/20                  Plan - 09/27/20 0813    Clinical Impression Statement Pt is a pleasant 64 year-old male referred for difficulty with his balance since developing neuropathy s/p chemotherapy. Overall patient's balance is fairly good scoring 53/56 on the BERG and 22/24 on the DGI. However his confidence is lacking with patient scoring 63.125% on the ABC. His 32m gait speed is WNL for full community ambulation and his 5TSTS is also WNL. However his 62s Sit to Stand test reveals some deficits in LE endurance with pt completing  13 reps over the course of 30s. He also struggles with single leg balance especially on his LLE. Pt would like to get back to tennis and he will benefit from skilled PT services to work on dynamic balance and strength in order to work toward this goal.    Personal Factors and Comorbidities Age;Comorbidity 3+    Comorbidities multiple myeloma, colon CA, afib, anemia, OA    Examination-Activity Limitations Stairs;Squat;Other   Running   Examination-Participation Restrictions Arts administrator;Yard Work    Merchant navy officer Evolving/Moderate complexity    Clinical Decision Making Moderate    Rehab Potential Fair    PT Frequency 2x / week    PT Duration 8 weeks    PT Treatment/Interventions ADLs/Self Care Home Management;Biofeedback;Aquatic Therapy;Canalith Repostioning;Cryotherapy;Electrical Stimulation;Iontophoresis 4mg /ml Dexamethasone;Moist Heat;Traction;Ultrasound;DME Instruction;Gait training;Stair training;Functional mobility training;Therapeutic activities;Therapeutic exercise;Balance training;Neuromuscular re-education;Patient/family education;Manual techniques;Passive range of motion;Dry  needling;Vestibular;Joint Manipulations    PT Next Visit Plan 2MWT, review HEP, progress balance and return to sport strengthening (tennis)    PT Home Exercise Plan Access Code: [email or web address]    Consulted and Agree with Plan of Care Patient           Patient will benefit from skilled therapeutic intervention in order to improve the following deficits and impairments:  Decreased balance, Decreased strength  Visit Diagnosis: Muscle weakness (generalized)  Other lack of coordination     Problem List Patient Active Problem List   Diagnosis Date Noted  . Port-A-Cath in place   . Abnormal ECG 11/04/2019  . 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[place]) 10/27/2019  . Neoplasm of digestive system   . Intraoperative partial intestinal obstruction ([place])   . Acute esophagogastric ulcer   . Goals of care, counseling/discussion 05/18/2019  . Multiple myeloma ([place]) 05/17/2019  . Anemia 05/12/2019  . Arthritis of left ankle 02/05/2018  . Cardiomyopathy, idiopathic ([place]) 01/07/2018  . Enlarged heart 01/01/2017  . Moderate tricuspid insufficiency 01/01/2017  . OSA (obstructive sleep apnea) 01/01/2017  . Chronic a-fib ([place]) 12/06/2015  . Pure hypercholesterolemia 02/09/2015  . Type 2 diabetes mellitus without complication ([place]) 57/90/3833  . Anticoagulant long-term use 04/29/2014  . Hyperlipidemia 04/29/2014  . Hypertension, essential, benign 04/26/2014   Phillips Grout PT, DPT, GCS  Jhada Risk 09/27/2020, 10:53 AM  [facility] [address] [place], [name], [address] Phone: [phone]   Fax:  [phone]  Name: BETZALEL UMBARGER MRN: [id number] Date of Birth: [birth year]

## 2020-10-04 ENCOUNTER — Ambulatory Visit: Payer: BC Managed Care – PPO

## 2020-10-04 ENCOUNTER — Ambulatory Visit: Payer: BC Managed Care – PPO | Admitting: Occupational Therapy

## 2020-10-06 ENCOUNTER — Ambulatory Visit: Payer: BC Managed Care – PPO

## 2020-10-07 ENCOUNTER — Inpatient Hospital Stay: Payer: BC Managed Care – PPO | Attending: Oncology

## 2020-10-07 DIAGNOSIS — Z452 Encounter for adjustment and management of vascular access device: Secondary | ICD-10-CM | POA: Insufficient documentation

## 2020-10-07 DIAGNOSIS — C189 Malignant neoplasm of colon, unspecified: Secondary | ICD-10-CM | POA: Insufficient documentation

## 2020-10-11 ENCOUNTER — Ambulatory Visit: Payer: BC Managed Care – PPO

## 2020-10-24 ENCOUNTER — Inpatient Hospital Stay: Payer: Self-pay

## 2020-10-24 ENCOUNTER — Other Ambulatory Visit: Payer: Self-pay

## 2020-10-24 DIAGNOSIS — Z95828 Presence of other vascular implants and grafts: Secondary | ICD-10-CM

## 2020-10-24 MED ORDER — SODIUM CHLORIDE 0.9% FLUSH
10.0000 mL | INTRAVENOUS | Status: DC | PRN
  Administered 2020-10-24: 14:00:00 10 mL via INTRAVENOUS
  Filled 2020-10-24: qty 10

## 2020-10-24 MED ORDER — HEPARIN SOD (PORK) LOCK FLUSH 100 UNIT/ML IV SOLN
INTRAVENOUS | Status: AC
  Filled 2020-10-24: qty 5

## 2020-10-24 MED ORDER — HEPARIN SOD (PORK) LOCK FLUSH 100 UNIT/ML IV SOLN
500.0000 [IU] | Freq: Once | INTRAVENOUS | Status: AC
  Administered 2020-10-24: 14:00:00 500 [IU] via INTRAVENOUS
  Filled 2020-10-24: qty 5

## 2020-11-18 ENCOUNTER — Inpatient Hospital Stay: Payer: BC Managed Care – PPO

## 2020-11-18 ENCOUNTER — Ambulatory Visit: Admission: RE | Admit: 2020-11-18 | Payer: Self-pay | Source: Ambulatory Visit

## 2020-11-18 IMAGING — CR METASTATIC BONE SURVEY
9 of 10 series · 9 of 10 positions shown · non-contrast
Comparison: 02/15/2012

CLINICAL DATA: Multiple myeloma. Evaluate remission status.
Follow-up exam.

EXAM:
METASTATIC BONE SURVEY

[chest pa]
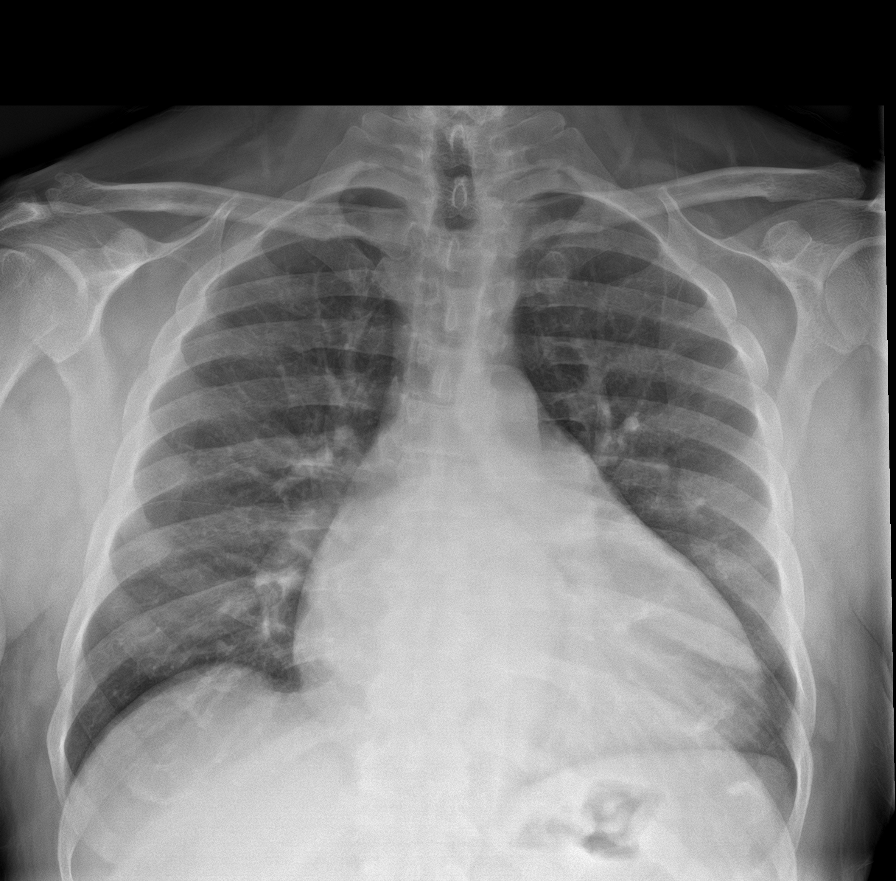

[c-spine lat]
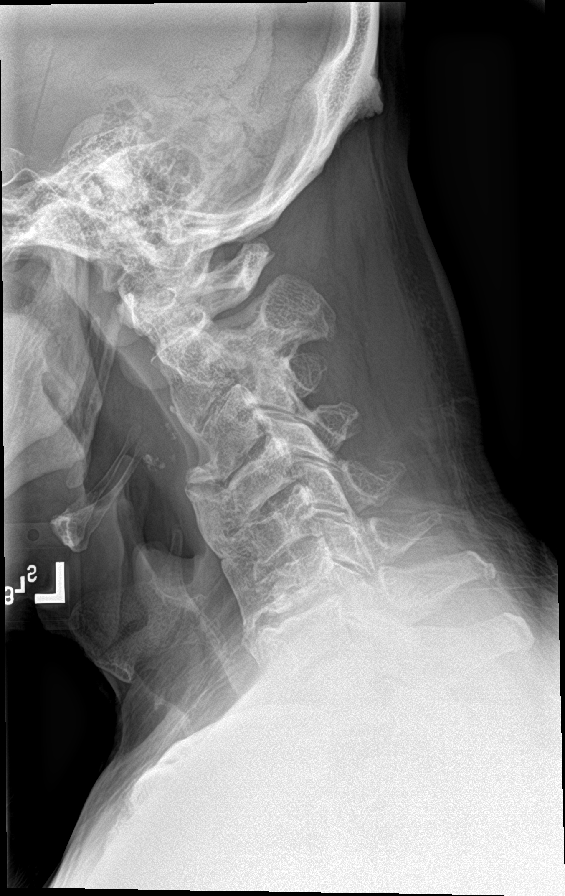

[c-spine swimmers]
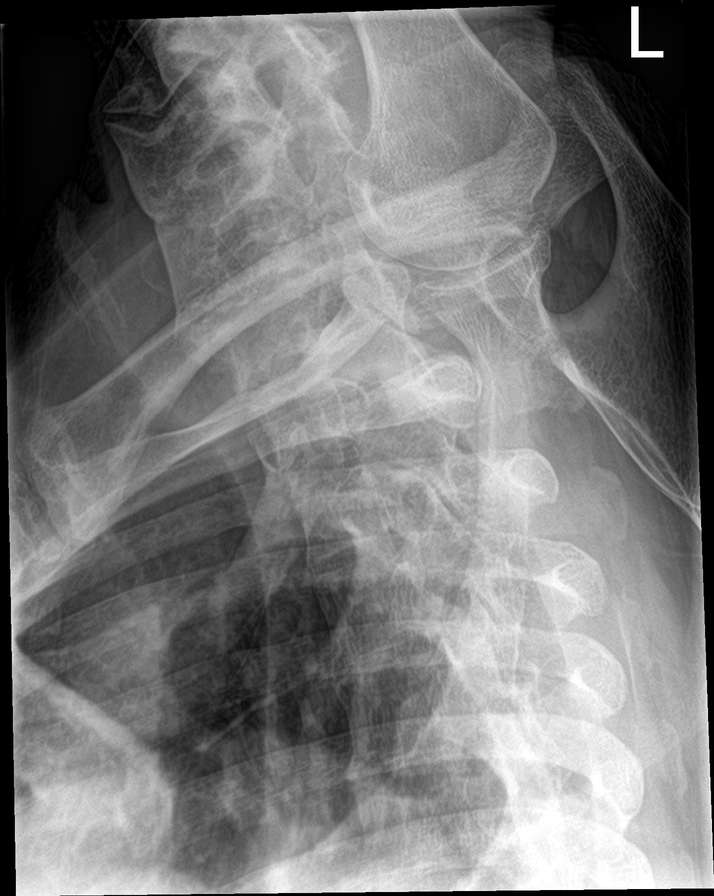

[skull lat]
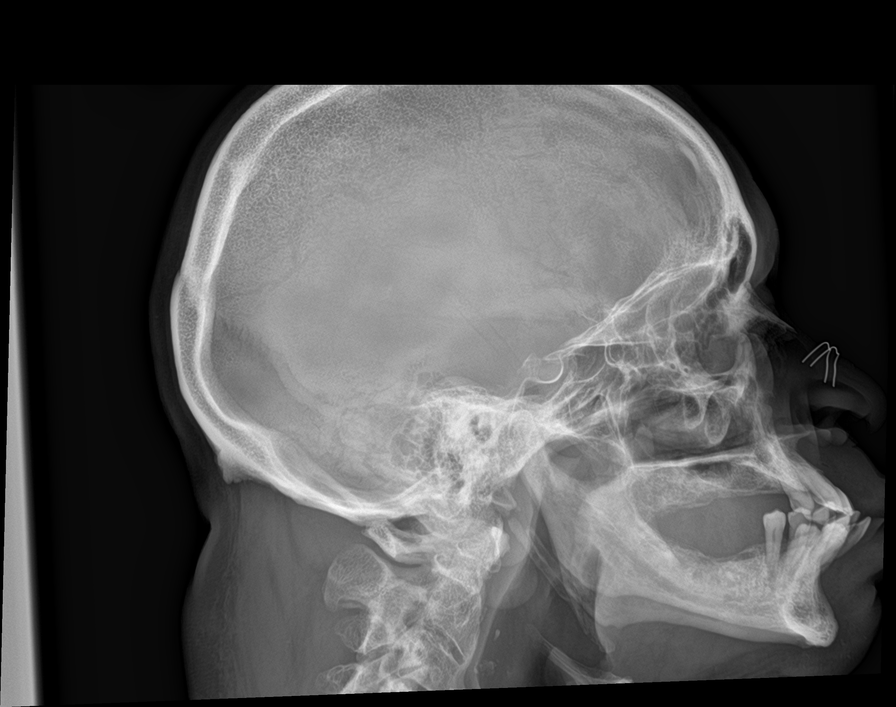

[c-spine ap]
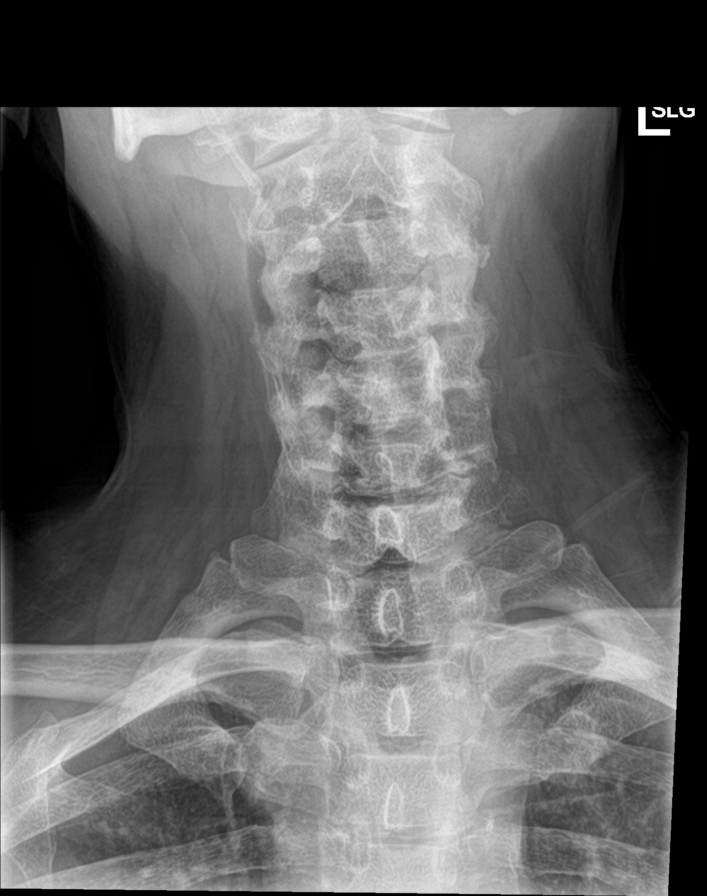

[shoulder ap (1 of 2)]
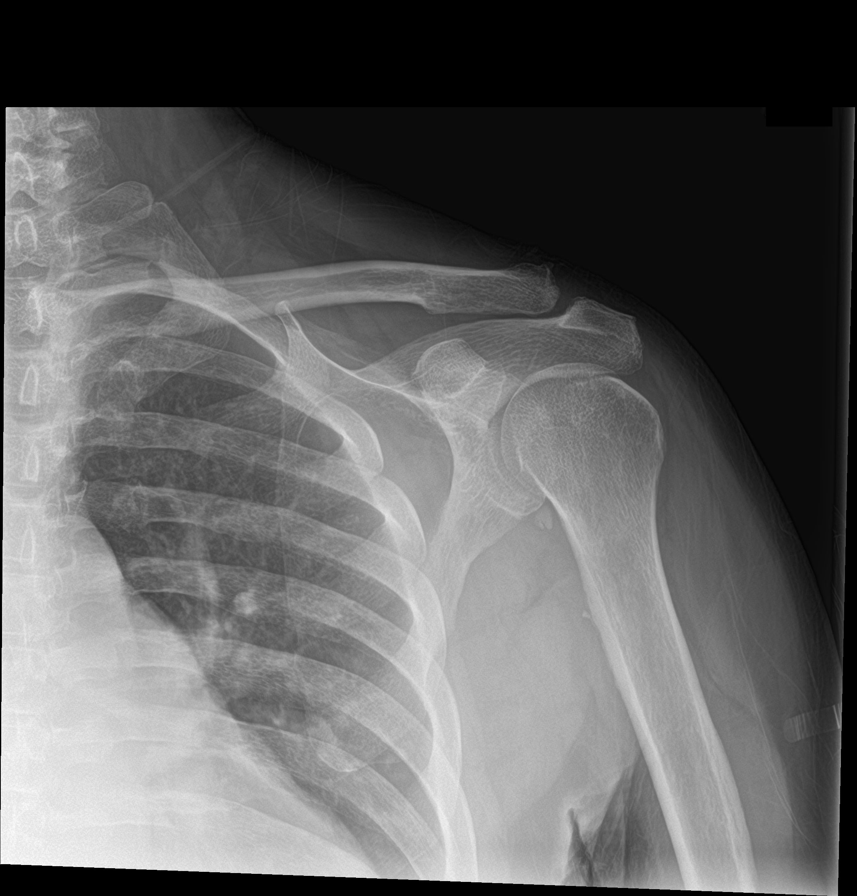

[shoulder ap (2 of 2)]
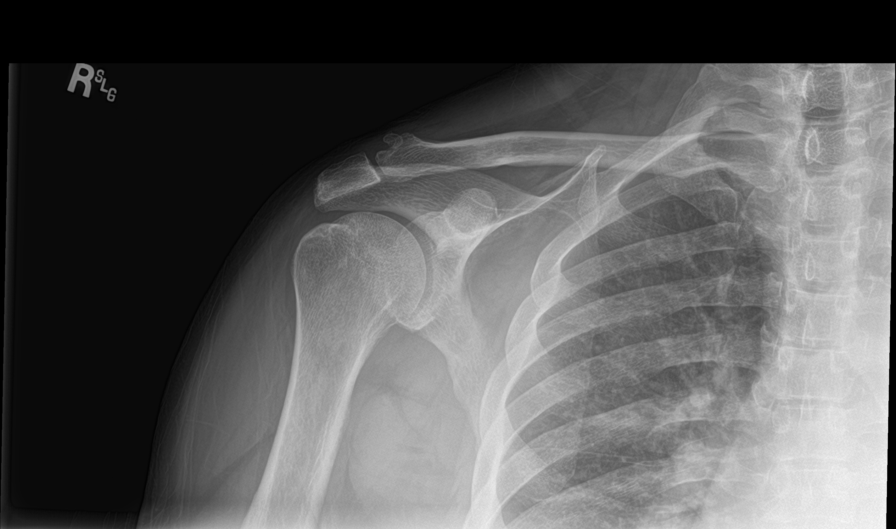

[humerus ap (1 of 2)]
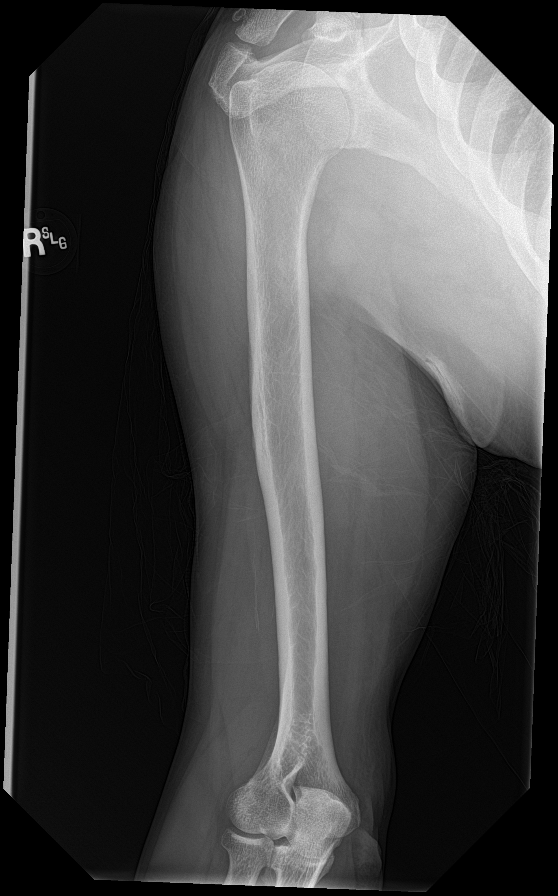

[humerus ap (2 of 2)]
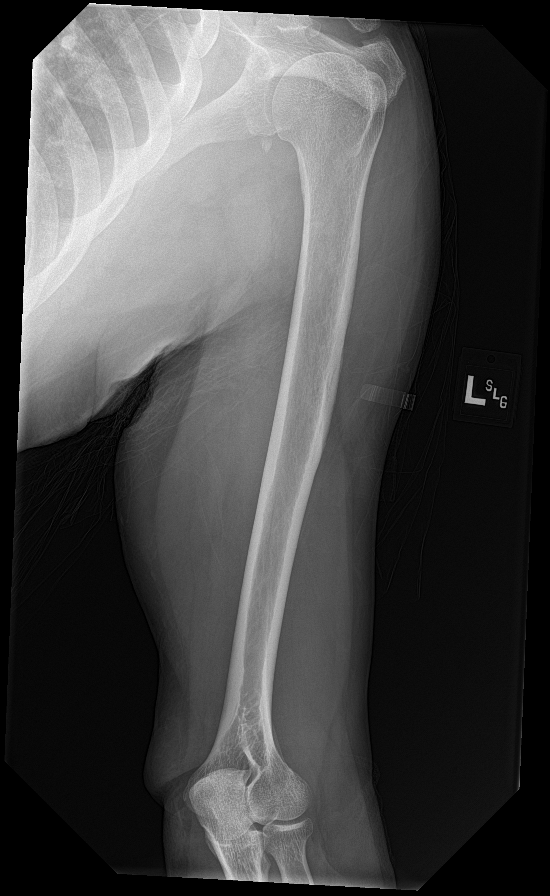

[9 of 10 positions shown; findings below may reference images not displayed]

FINDINGS: There are no bone lesions. Specifically there are no lesions
suspicious for multiple myeloma.

Degenerative changes with large bridging anterior osteophytes are
noted along the cervical spine, with osteophytes becoming more
pronounced since the prior bone survey. No other change.
IMPRESSION: No bone lesions to suggest multiple myeloma.

## 2020-11-24 ENCOUNTER — Ambulatory Visit: Payer: BC Managed Care – PPO | Admitting: Oncology

## 2021-05-10 ENCOUNTER — Telehealth: Payer: Self-pay

## 2021-05-10 NOTE — Telephone Encounter (Signed)
Disability paperwork to be completed and returned to: [place]:  [id number] F:  [phone]  Patient last received tx on 06/03/20 with last MD visit on 08/19/20 with f/u plan of 3 months: lab, ct scan. See FINN 1 week later.  Patient cx his 11/18/20 CT appt as well as the MD appt on 11/24/20.  Dr. Grayland Ormond is out of the office and will on 05/23/20.  Will ask scheduling to contact patient to see if he would like to r/s his CT and MD appts.

## 2021-05-11 NOTE — Telephone Encounter (Signed)
Spoke to patient and confirmed the [employer] is company he has his disability ins with.  Advised that Dr. Grayland Ormond will assess his residual neuropathy at his appt prior to completing the form. Patient agrees with plan and OK with having his missed appts r/s.

## 2021-05-18 ENCOUNTER — Telehealth: Payer: Self-pay | Admitting: Nurse Practitioner

## 2021-05-18 NOTE — Telephone Encounter (Signed)
Pt is stating he has disability papers he need to have filled out and wanted to know where they were. He states they are here when he called to get his appts. Not sure if he needed to be seen first or what, its a [place] pt didn't know if anyone of you knew anything. Please advise

## 2021-05-19 ENCOUNTER — Encounter: Payer: Self-pay | Admitting: Oncology

## 2021-05-19 NOTE — Telephone Encounter (Signed)
I also faxed notification to the [facility] life and disability claim form is pending MD f/u on 06/14/21.

## 2021-05-19 NOTE — Telephone Encounter (Signed)
Per Tonita [place]., ins auth dept, No authorization will be required by these plans and she will update ins info.

## 2021-05-19 NOTE — Telephone Encounter (Signed)
Patient wanted to let us know that he has new insurance and we do not have it on file yet.  I will forward the new ins information to our ins auth dept to see if they can obtain PA for CT without copy of card [facility] policy # [id number]; [employer] supplement policy # [id number])

## 2021-05-31 ENCOUNTER — Ambulatory Visit: Payer: Medicaid Other

## 2021-06-01 ENCOUNTER — Encounter: Payer: Self-pay | Admitting: Oncology

## 2021-06-07 ENCOUNTER — Ambulatory Visit
Admission: RE | Admit: 2021-06-07 | Discharge: 2021-06-07 | Disposition: A | Discharge status: Home / Self Care | Payer: Medicare HMO | Source: Ambulatory Visit | Attending: Oncology | Admitting: Oncology

## 2021-06-07 ENCOUNTER — Other Ambulatory Visit: Payer: Self-pay

## 2021-06-07 DIAGNOSIS — C184 Malignant neoplasm of transverse colon: Secondary | ICD-10-CM | POA: Insufficient documentation

## 2021-06-07 LAB — POCT I-STAT CREATININE: Creatinine, Ser: 1.6 mg/dL — ABNORMAL HIGH (ref 0.61–1.24)

## 2021-06-07 MED ORDER — IOHEXOL 350 MG/ML SOLN
75.0000 mL | Freq: Once | INTRAVENOUS | Status: AC | PRN
  Administered 2021-06-07: 75 mL via INTRAVENOUS

## 2021-06-09 NOTE — Progress Notes (Deleted)
[place]  [phone]) [phone] [phone]) [phone]  ID: KOAL ESLINGER OB: 04-Apr-1956  MR#: [id number]  KPT#:[id number]  Patient Care Team: Sofie Hartigan, MD as PCP - General (Family Medicine) Lloyd Huger, MD as Consulting Physician (Oncology) Anabel Bene, MD as Referring Physician (Neurology)  CHIEF COMPLAINT: Stage IIIc colon cancer, smoldering myeloma.  INTERVAL HISTORY: Patient returns to clinic today for repeat laboratory work, discussion of his imaging results, and further evaluation.  He continues to have peripheral neuropathy but seems to be worse in his hands and his feet, but still is affecting his quality of life.  He denies any pain, but persistent numbness and tingling.  He continues to anxious regarding his financial situation.  He has no other neurologic complaints.  He has no chest pain, shortness of breath, cough, or hemoptysis.  He denies any nausea, vomiting, constipation, or diarrhea.  He has no melena or hematochezia.  He has no urinary complaints.  Patient offers no further specific complaints today.  REVIEW OF SYSTEMS:   Review of Systems  Constitutional: Negative.  Negative for fever, malaise/fatigue and weight loss.  Respiratory: Negative.  Negative for cough, hemoptysis and shortness of breath.   Cardiovascular: Negative.  Negative for chest pain and leg swelling.  Gastrointestinal: Negative.  Negative for abdominal pain, blood in stool and melena.  Genitourinary: Negative.  Negative for hematuria.  Musculoskeletal: Negative.  Negative for back pain.  Skin: Negative.  Negative for rash.  Neurological:  Positive for tingling and sensory change. Negative for dizziness, focal weakness, weakness and headaches.  Psychiatric/Behavioral:  Positive for depression. The patient is nervous/anxious.    As per HPI. Otherwise, a complete review of systems is negative.  PAST MEDICAL HISTORY: Past Medical History:  Diagnosis Date    A-fib ([place])    Anemia    Arthritis    ankles   Cancer (HCC)    multiple myeloma   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[place]) 10/26/2019   Cardiomyopathy ([place])    CHF (congestive heart failure) (HCC)    Depression    Dysrhythmia    atrial fib   HLD (hyperlipidemia)    Hypercholesteremia    Hypertension    Moderate tricuspid insufficiency    Multiple myeloma (HCC)    not being treated right now per pt 11/11/19   Pneumonia    Sleep apnea    No CPAP    PAST SURGICAL HISTORY: Past Surgical History:  Procedure Laterality Date   ATRIAL ABLATION SURGERY     CARDIAC SURGERY     A-Fib Ablations   COLONOSCOPY WITH PROPOFOL N/A 10/26/2019   Procedure: COLONOSCOPY WITH BIOPSY;  Surgeon: Lucilla Lame, MD;  Location: [facility];  Service: Endoscopy;  Laterality: N/A;   ESOPHAGOGASTRODUODENOSCOPY (EGD) WITH PROPOFOL N/A 10/26/2019   Procedure: ESOPHAGOGASTRODUODENOSCOPY (EGD) WITH BIOPSY;  Surgeon: Lucilla Lame, MD;  Location: [facility];  Service: Endoscopy;  Laterality: N/A;  sleep apnea   LAPAROSCOPIC PARTIAL COLECTOMY Right 11/17/2019   Procedure: LAPAROSCOPIC PARTIAL COLECTOMY RIGHT EXTENDED;  Surgeon: Olean Ree, MD;  Location: [facility];  Service: General;  Laterality: Right;   PORTACATH PLACEMENT N/A 12/28/2019   Procedure: INSERTION PORT-A-CATH Left subclavian;  Surgeon: Olean Ree, MD;  Location: [facility];  Service: General;  Laterality: N/A;    FAMILY HISTORY: Family History  Problem Relation Age of Onset   Healthy Mother     ADVANCED DIRECTIVES (Y/N):  N  HEALTH MAINTENANCE: Social History   Tobacco Use   Smoking  status: Never   Smokeless tobacco: Never  Vaping Use   Vaping Use: Never used  Substance Use Topics   Alcohol use: Not Currently   Drug use: Never     Colonoscopy:  PAP:  Bone density:  Lipid panel:  No Known Allergies  Current Outpatient Medications  Medication Sig Dispense Refill   APPLE CIDER VINEGAR PO Take 30-45 mLs by mouth  every other day.      aspirin 325 MG EC tablet Take 325 mg by mouth every other day. In the morning     digoxin (DIGOX) 0.25 MG tablet Take 0.25 mg by mouth daily.      DULoxetine (CYMBALTA) 60 MG capsule Take 1 capsule (60 mg total) by mouth daily. 30 capsule 0   furosemide (LASIX) 80 MG tablet Take 80 mg by mouth 2 (two) times daily. Morning & late afternoon     lidocaine-prilocaine (EMLA) cream Apply to affected area once 30 g 3   Misc Natural Products (TART CHERRY ADVANCED PO) Take 60 mLs by mouth every other day.     potassium bicarbonate (KLOR-CON/EF) 25 MEQ disintegrating tablet Take 25 mEq by mouth 4 (four) times a week. In the afternoon.     prochlorperazine (COMPAZINE) 10 MG tablet Take 1 tablet (10 mg total) by mouth every 6 (six) hours as needed (Nausea or vomiting). 60 tablet 2   No current facility-administered medications for this visit.    OBJECTIVE: There were no vitals filed for this visit.    There is no height or weight on file to calculate BMI.    ECOG FS:1 - Symptomatic but completely ambulatory  General: Well-developed, well-nourished, no acute distress. Eyes: Pink conjunctiva, anicteric sclera. HEENT: Normocephalic, moist mucous membranes. Lungs: No audible wheezing or coughing. Heart: Regular rate and rhythm. Abdomen: Soft, nontender, no obvious distention. Musculoskeletal: No edema, cyanosis, or clubbing. Neuro: Alert, answering all questions appropriately. Cranial nerves grossly intact. Skin: No rashes or petechiae noted. Psych: Normal affect.  LAB RESULTS:  Lab Results  Component Value Date   NA 139 08/19/2020   K 3.4 (L) 08/19/2020   CL 105 08/19/2020   CO2 29 08/19/2020   GLUCOSE 90 08/19/2020   BUN 20 08/19/2020   CREATININE 1.60 (H) 06/07/2021   CALCIUM 7.6 (L) 08/19/2020   PROT 6.0 (L) 08/19/2020   ALBUMIN 2.5 (L) 08/19/2020   AST 37 08/19/2020   ALT 18 08/19/2020   ALKPHOS 135 (H) 08/19/2020   BILITOT 1.0 08/19/2020   GFRNONAA 49 (L)  08/19/2020   GFRAA >60 07/29/2020    Lab Results  Component Value Date   WBC 2.3 (L) 08/19/2020   NEUTROABS 1.4 (L) 08/19/2020   HGB 9.9 (L) 08/19/2020   HCT 30.1 (L) 08/19/2020   MCV 88.8 08/19/2020   PLT 97 (L) 08/19/2020   Lab Results  Component Value Date   IRON 41 (L) 05/11/2020   TIBC 323 05/11/2020   IRONPCTSAT 13 (L) 05/11/2020   Lab Results  Component Value Date   FERRITIN 86 05/11/2020     STUDIES: CT Abdomen Pelvis W Contrast  Result Date: 06/08/2021 CLINICAL DATA:  Follow-up colon carcinoma. Previous surgery and chemotherapy. Cirrhosis. EXAM: CT ABDOMEN AND PELVIS WITH CONTRAST TECHNIQUE: Multidetector CT imaging of the abdomen and pelvis was performed using the standard protocol following bolus administration of intravenous contrast. CONTRAST:  57m OMNIPAQUE IOHEXOL 350 MG/ML SOLN COMPARISON:  08/09/2020 FINDINGS: Lower Chest: Stable moderate cardiomegaly. Hepatobiliary: Stable appearance of hepatic cirrhosis. Distension of IVC and hepatic  veins again demonstrated. No hepatic masses identified. Gallbladder is unremarkable. No evidence of biliary ductal dilatation. Pancreas:  No mass or inflammatory changes. Spleen: Within normal limits in size and appearance. Adrenals/Urinary Tract: No masses identified. Tiny cyst again noted in lower pole of left kidney. No evidence of ureteral calculi or hydronephrosis. Stomach/Bowel: Stable postop changes from previous right colectomy. No soft tissue masses identified. No evidence of obstruction, inflammatory process or abnormal fluid collections. Vascular/Lymphatic: Retroperitoneal lymphadenopathy in the aortocaval space is increased, currently measuring 2.0 cm in short axis on image 34/2 compared to 1.1 cm previously. No other sites of lymphadenopathy identified. 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 No acute vascular findings. Reproductive:  No mass or other significant  abnormality. Other:  None. Musculoskeletal:  No suspicious bone lesions identified. IMPRESSION: Mild increase in retroperitoneal lymphadenopathy in aortocaval space, suspicious for metastatic disease. No other sites of metastatic disease identified. Stable hepatic cirrhosis. No evidence of hepatic neoplasm. Electronically Signed   By: Marlaine Hind M.D.   On: 06/08/2021 09:42     ASSESSMENT: Stage IIIc colon cancer, smoldering myeloma.  PLAN:    1.  Stage IIIc colon cancer: Imaging and final pathology reviewed independently confirming stage of disease.  Patient completed cycle 9 of adjuvant FOLFOX on 06/03/2020.  Given his difficulties with treatment and declining performance status, treatment was discontinued altogether.  No further intervention is needed.  Patient will require colonoscopy and restaging CT scan in the next 6 to 12 months.  Despite an increased CEA, CT scan results from August 09, 2020 reviewed independently and report as above with no obvious evidence of recurrent or progressive disease.  Patient noted to have a minimally enlarged retrocaval node of unclear etiology.  Repeat imaging and lab work in 3 months with follow-up 1 to 2 days later.    2. Smoldering myeloma: Patient was initially diagnosed and treated in [place], [place] and treated with single agent Velcade in approximately 2013.  He was evaluated in clinic in July 2014 when he declined any maintenance treatment or referral for bone marrow transplant.  He was subsequently lost to follow-up.  His most recent SPEP revealed an M spike of 2.0 with an IgG predominance of 2949.  These are unchanged from 3 months prior.  He also has a suppressed IgA level.  Kappa/lambda light chain ratio has trended up slightly to 2.17.  He has mild renal insufficiency, but no other evidence of endorgan damage.  Metastatic bone survey on May 20, 2019 reviewed independently with no obvious bony lesions.  Bone marrow biopsy completed on May 28, 2019  revealed only 10 to 15% plasma cells with no clonality reported.  Cytogenetics were also reported as normal.  Patient's most recent M spike has trended down to 1.2.  IgG component has also trended down to 1782.  No intervention is needed at this time.  Repeat laboratory work in 3 months.  3.  Iron deficiency anemia: Chronic and unchanged.  Patient's hemoglobin is 9.9 today.  He last received IV Feraheme on October 06, 2019.   4.  Renal insufficiency: Chronic and unchanged.  Patient's creatinine is 1.48. 5.  Peripheral neuropathy: Patient does not describe pain, only numbness.  Worse in his hands than his feet.  Continue gabapentin as prescribed.  Keep follow-up appointment with neurology as scheduled.  Patient was also given a referral to Occupational Therapy for further evaluation and improvement of his hand dexterity. 6.  Neutropenia: Chronic and unchanged. 7. Thrombocytopenia: Mildly improved, monitor. 8.  Hypokalemia: Potassium mildly decreased, monitor. 9.  Financial concerns: Patient was previously given a referral to financial navigator.  Patient expressed understanding and was in agreement with this plan. He also understands that He can call clinic at any time with any questions, concerns, or complaints.    Lloyd Huger, MD   06/09/2021 9:31 AM

## 2021-06-14 ENCOUNTER — Inpatient Hospital Stay: Payer: Medicare HMO | Admitting: Oncology

## 2021-06-14 ENCOUNTER — Inpatient Hospital Stay: Payer: Medicare HMO

## 2021-06-14 DIAGNOSIS — C184 Malignant neoplasm of transverse colon: Secondary | ICD-10-CM

## 2021-06-15 ENCOUNTER — Encounter: Payer: Self-pay | Admitting: Oncology

## 2021-06-15 ENCOUNTER — Inpatient Hospital Stay: Payer: Medicare HMO

## 2021-06-15 ENCOUNTER — Inpatient Hospital Stay: Payer: Medicare HMO | Attending: Oncology | Admitting: Oncology

## 2021-06-15 VITALS — BP 114/59 | HR 80 | Temp 97.8°F | Resp 20 | Wt 233.1 lb

## 2021-06-15 DIAGNOSIS — C9000 Multiple myeloma not having achieved remission: Secondary | ICD-10-CM | POA: Diagnosis not present

## 2021-06-15 DIAGNOSIS — D696 Thrombocytopenia, unspecified: Secondary | ICD-10-CM | POA: Insufficient documentation

## 2021-06-15 DIAGNOSIS — C186 Malignant neoplasm of descending colon: Secondary | ICD-10-CM

## 2021-06-15 DIAGNOSIS — E876 Hypokalemia: Secondary | ICD-10-CM | POA: Diagnosis not present

## 2021-06-15 DIAGNOSIS — D509 Iron deficiency anemia, unspecified: Secondary | ICD-10-CM | POA: Diagnosis not present

## 2021-06-15 DIAGNOSIS — N289 Disorder of kidney and ureter, unspecified: Secondary | ICD-10-CM | POA: Insufficient documentation

## 2021-06-15 DIAGNOSIS — C184 Malignant neoplasm of transverse colon: Secondary | ICD-10-CM

## 2021-06-15 DIAGNOSIS — D72819 Decreased white blood cell count, unspecified: Secondary | ICD-10-CM | POA: Insufficient documentation

## 2021-06-15 DIAGNOSIS — C189 Malignant neoplasm of colon, unspecified: Secondary | ICD-10-CM | POA: Insufficient documentation

## 2021-06-15 DIAGNOSIS — G629 Polyneuropathy, unspecified: Secondary | ICD-10-CM | POA: Insufficient documentation

## 2021-06-15 DIAGNOSIS — G620 Drug-induced polyneuropathy: Secondary | ICD-10-CM

## 2021-06-15 DIAGNOSIS — Z95828 Presence of other vascular implants and grafts: Secondary | ICD-10-CM

## 2021-06-15 DIAGNOSIS — D472 Monoclonal gammopathy: Secondary | ICD-10-CM | POA: Diagnosis not present

## 2021-06-15 LAB — COMPREHENSIVE METABOLIC PANEL
ALT: 20 U/L (ref 0–44)
AST: 34 U/L (ref 15–41)
Albumin: 3.1 g/dL — ABNORMAL LOW (ref 3.5–5.0)
Alkaline Phosphatase: 117 U/L (ref 38–126)
Anion gap: 8 (ref 5–15)
BUN: 22 mg/dL (ref 8–23)
CO2: 28 mmol/L (ref 22–32)
Calcium: 8.3 mg/dL — ABNORMAL LOW (ref 8.9–10.3)
Chloride: 101 mmol/L (ref 98–111)
Creatinine, Ser: 1.46 mg/dL — ABNORMAL HIGH (ref 0.61–1.24)
GFR, Estimated: 53 mL/min — ABNORMAL LOW (ref >60)
Glucose, Bld: 108 mg/dL — ABNORMAL HIGH (ref 70–99)
Potassium: 3.1 mmol/L — ABNORMAL LOW (ref 3.5–5.1)
Sodium: 137 mmol/L (ref 135–145)
Total Bilirubin: 0.9 mg/dL (ref 0.3–1.2)
Total Protein: 6.9 g/dL (ref 6.5–8.1)

## 2021-06-15 LAB — CBC WITH DIFFERENTIAL/PLATELET
Abs Immature Granulocytes: 0.01 10*3/uL (ref 0.00–0.07)
Basophils Absolute: 0 10*3/uL (ref 0.0–0.1)
Basophils Relative: 1 %
Eosinophils Absolute: 0.1 10*3/uL (ref 0.0–0.5)
Eosinophils Relative: 2 %
HCT: 33 % — ABNORMAL LOW (ref 39.0–52.0)
Hemoglobin: 10.3 g/dL — ABNORMAL LOW (ref 13.0–17.0)
Immature Granulocytes: 0 %
Lymphocytes Relative: 15 %
Lymphs Abs: 0.5 10*3/uL — ABNORMAL LOW (ref 0.7–4.0)
MCH: 28.4 pg (ref 26.0–34.0)
MCHC: 31.2 g/dL (ref 30.0–36.0)
MCV: 90.9 fL (ref 80.0–100.0)
Monocytes Absolute: 0.4 10*3/uL (ref 0.1–1.0)
Monocytes Relative: 13 %
Neutro Abs: 2.4 10*3/uL (ref 1.7–7.7)
Neutrophils Relative %: 69 %
Platelets: 131 10*3/uL — ABNORMAL LOW (ref 150–400)
RBC: 3.63 MIL/uL — ABNORMAL LOW (ref 4.22–5.81)
RDW: 14.4 % (ref 11.5–15.5)
WBC: 3.5 10*3/uL — ABNORMAL LOW (ref 4.0–10.5)
nRBC: 0 % (ref 0.0–0.2)

## 2021-06-15 MED ORDER — SODIUM CHLORIDE 0.9% FLUSH
10.0000 mL | INTRAVENOUS | Status: DC | PRN
  Administered 2021-06-15: 10 mL via INTRAVENOUS
  Filled 2021-06-15: qty 10

## 2021-06-15 MED ORDER — HEPARIN SOD (PORK) LOCK FLUSH 100 UNIT/ML IV SOLN
500.0000 [IU] | Freq: Once | INTRAVENOUS | Status: AC
  Administered 2021-06-15: 500 [IU] via INTRAVENOUS
  Filled 2021-06-15: qty 5

## 2021-06-15 MED ORDER — HEPARIN SOD (PORK) LOCK FLUSH 100 UNIT/ML IV SOLN
INTRAVENOUS | Status: AC
  Filled 2021-06-15: qty 5

## 2021-06-15 NOTE — Progress Notes (Signed)
[facility]  Telephone:(336) [phone] Fax:(336) [phone]  ID: Chris George OB: August 08, 1956  MR#: [id number]  JHE#:[id number]  Patient Care Team: Sofie Hartigan, MD as PCP - General (Family Medicine) Lloyd Huger, MD as Consulting Physician (Oncology) Anabel Bene, MD as Referring Physician (Neurology)  CHIEF COMPLAINT: Stage IIIc colon cancer, smoldering myeloma.  INTERVAL HISTORY: Patient was last evaluated in clinic in October 2021.  He returns to clinic today for repeat laboratory work and discussion of his imaging results.  His peripheral neuropathy has significantly improved and he is now considering restarting playing tennis.  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  He otherwise feels well and is asymptomatic.  He has no other neurologic complaints. He has no chest pain, shortness of breath, cough, or hemoptysis.  He denies any nausea, vomiting, constipation, or diarrhea.  He has no melena or hematochezia.  He has no urinary complaints.  Patient offers no further specific complaints today.  REVIEW OF SYSTEMS:   Review of Systems  Constitutional: Negative.  Negative for fever, malaise/fatigue and weight loss.  Respiratory: Negative.  Negative for cough, hemoptysis and shortness of breath.   Cardiovascular: Negative.  Negative for chest pain and leg swelling.  Gastrointestinal: Negative.  Negative for abdominal pain, blood in stool and melena.  Genitourinary: Negative.  Negative for hematuria.  Musculoskeletal: Negative.  Negative for back pain.  Skin: Negative.  Negative for rash.  Neurological:  Positive for tingling and sensory change. Negative for dizziness, focal weakness, weakness and headaches.  Psychiatric/Behavioral: Negative.  Negative for depression. The patient is not nervous/anxious.    As per HPI. Otherwise, a complete review of systems is negative.  PAST MEDICAL HISTORY: Past Medical History:  Diagnosis  Date   A-fib ([place])    Anemia    Arthritis    ankles   Cancer (HCC)    multiple myeloma   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[facility]) 10/26/2019   Cardiomyopathy ([place])    CHF (congestive heart failure) (HCC)    Depression    Dysrhythmia    atrial fib   HLD (hyperlipidemia)    Hypercholesteremia    Hypertension    Moderate tricuspid insufficiency    Multiple myeloma ([facility])    not being treated right now per pt 11/11/19   Pneumonia    Sleep apnea    No CPAP    PAST SURGICAL HISTORY: Past Surgical History:  Procedure Laterality Date   ATRIAL ABLATION SURGERY     CARDIAC SURGERY     A-Fib Ablations   COLONOSCOPY WITH PROPOFOL N/A 10/26/2019   Procedure: COLONOSCOPY WITH BIOPSY;  Surgeon: Lucilla Lame, MD;  Location: [facility];  Service: Endoscopy;  Laterality: N/A;   ESOPHAGOGASTRODUODENOSCOPY (EGD) WITH PROPOFOL N/A 10/26/2019   Procedure: ESOPHAGOGASTRODUODENOSCOPY (EGD) WITH BIOPSY;  Surgeon: Lucilla Lame, MD;  Location: [facility];  Service: Endoscopy;  Laterality: N/A;  sleep apnea   LAPAROSCOPIC PARTIAL COLECTOMY Right 11/17/2019   Procedure: LAPAROSCOPIC PARTIAL COLECTOMY RIGHT EXTENDED;  Surgeon: Olean Ree, MD;  Location: [facility];  Service: General;  Laterality: Right;   PORTACATH PLACEMENT N/A 12/28/2019   Procedure: INSERTION PORT-A-CATH Left subclavian;  Surgeon: Olean Ree, MD;  Location: [facility];  Service: General;  Laterality: N/A;    FAMILY HISTORY: Family History  Problem Relation Age of Onset   Healthy Mother     ADVANCED DIRECTIVES (Y/N):  N  HEALTH MAINTENANCE: Social History   Tobacco Use   Smoking  status: Never   Smokeless tobacco: Never  Vaping Use   Vaping Use: Never used  Substance Use Topics   Alcohol use: Not Currently   Drug use: Never     Colonoscopy:  PAP:  Bone density:  Lipid panel:  No Known Allergies  Current Outpatient Medications  Medication Sig Dispense Refill   APPLE CIDER VINEGAR PO Take 30-45 mLs by  mouth every other day.      aspirin 325 MG EC tablet Take 325 mg by mouth every other day. In the morning     digoxin (LANOXIN) 0.25 MG tablet Take 0.25 mg by mouth daily.      furosemide (LASIX) 80 MG tablet Take 80 mg by mouth 2 (two) times daily. Morning & late afternoon     lidocaine-prilocaine (EMLA) cream Apply to affected area once 30 g 3   Misc Natural Products (TART CHERRY ADVANCED PO) Take 60 mLs by mouth every other day.     potassium bicarbonate (K-LYTE) 25 MEQ disintegrating tablet Take 25 mEq by mouth 4 (four) times a week. In the afternoon.     DULoxetine (CYMBALTA) 60 MG capsule Take 1 capsule (60 mg total) by mouth daily. 30 capsule 0   prochlorperazine (COMPAZINE) 10 MG tablet Take 1 tablet (10 mg total) by mouth every 6 (six) hours as needed (Nausea or vomiting). 60 tablet 2   No current facility-administered medications for this visit.    OBJECTIVE: Vitals:   06/15/21 0954  BP: (!) 114/59  Pulse: 80  Resp: 20  Temp: 97.8 F (36.6 C)  SpO2: 100%      Body mass index is 34.42 kg/m.    ECOG FS:0 - Asymptomatic  General: Well-developed, well-nourished, no acute distress. Eyes: Pink conjunctiva, anicteric sclera. HEENT: Normocephalic, moist mucous membranes. Lungs: No audible wheezing or coughing. Heart: Regular rate and rhythm. Abdomen: Soft, nontender, no obvious distention. Musculoskeletal: No edema, cyanosis, or clubbing. Neuro: Alert, answering all questions appropriately. Cranial nerves grossly intact. Skin: No rashes or petechiae noted. Psych: Normal affect.   LAB RESULTS:  Lab Results  Component Value Date   NA 137 06/15/2021   K 3.1 (L) 06/15/2021   CL 101 06/15/2021   CO2 28 06/15/2021   GLUCOSE 108 (H) 06/15/2021   BUN 22 06/15/2021   CREATININE 1.46 (H) 06/15/2021   CALCIUM 8.3 (L) 06/15/2021   PROT 6.9 06/15/2021   ALBUMIN 3.1 (L) 06/15/2021   AST 34 06/15/2021   ALT 20 06/15/2021   ALKPHOS 117 06/15/2021   BILITOT 0.9 06/15/2021    GFRNONAA 53 (L) 06/15/2021   GFRAA >60 07/29/2020    Lab Results  Component Value Date   WBC 3.5 (L) 06/15/2021   NEUTROABS 2.4 06/15/2021   HGB 10.3 (L) 06/15/2021   HCT 33.0 (L) 06/15/2021   MCV 90.9 06/15/2021   PLT 131 (L) 06/15/2021   Lab Results  Component Value Date   IRON 41 (L) 05/11/2020   TIBC 323 05/11/2020   IRONPCTSAT 13 (L) 05/11/2020   Lab Results  Component Value Date   FERRITIN 86 05/11/2020     STUDIES: CT Abdomen Pelvis W Contrast  Result Date: 06/08/2021 CLINICAL DATA:  Follow-up colon carcinoma. Previous surgery and chemotherapy. Cirrhosis. EXAM: CT ABDOMEN AND PELVIS WITH CONTRAST TECHNIQUE: Multidetector CT imaging of the abdomen and pelvis was performed using the standard protocol following bolus administration of intravenous contrast. CONTRAST:  64m OMNIPAQUE IOHEXOL 350 MG/ML SOLN COMPARISON:  08/09/2020 FINDINGS: Lower Chest: Stable moderate cardiomegaly. Hepatobiliary: Stable  appearance of hepatic cirrhosis. Distension of IVC and hepatic veins again demonstrated. No hepatic masses identified. Gallbladder is unremarkable. No evidence of biliary ductal dilatation. Pancreas:  No mass or inflammatory changes. Spleen: Within normal limits in size and appearance. Adrenals/Urinary Tract: No masses identified. Tiny cyst again noted in lower pole of left kidney. No evidence of ureteral calculi or hydronephrosis. Stomach/Bowel: Stable postop changes from previous right colectomy. No soft tissue masses identified. No evidence of obstruction, inflammatory process or abnormal fluid collections. Vascular/Lymphatic: Retroperitoneal lymphadenopathy in the aortocaval space is increased, currently measuring 2.0 cm in short axis on image 34/2 compared to 1.1 cm previously. No other sites of lymphadenopathy identified. 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 No acute vascular findings. Reproductive:  No mass or other  significant abnormality. Other:  None. Musculoskeletal:  No suspicious bone lesions identified. IMPRESSION: Mild increase in retroperitoneal lymphadenopathy in aortocaval space, suspicious for metastatic disease. No other sites of metastatic disease identified. Stable hepatic cirrhosis. No evidence of hepatic neoplasm. Electronically Signed   By: Marlaine Hind M.D.   On: 06/08/2021 09:42     ASSESSMENT: Stage IIIc colon cancer, smoldering myeloma.  PLAN:    1.  Stage IIIc colon cancer: Patient completed cycle 9 of adjuvant FOLFOX on 06/03/2020.  Given his difficulties with treatment and declining performance status, treatment was discontinued altogether.  Patient's last colonoscopy was on October 26, 2019.  He has not had a repeat procedure since completion of treatment.  CT scan results from June 08, 2021 reviewed independently and reported as above with a mild increase in retroperitoneal lymphadenopathy.  Patient has agreed to PET scan for further evaluation.  CEA is pending at time of dictation.  Return to clinic in approximately 3 weeks after his PET scan to discuss the results.      2. Smoldering myeloma: Patient was initially diagnosed and treated in [place], [place] and treated with single agent Velcade in approximately 2013.  He was evaluated in clinic in July 2014 when he declined any maintenance treatment or referral for bone marrow transplant.  He was subsequently lost to follow-up.  His most recent SPEP revealed an M spike of 2.0 with an IgG predominance of 2949.  These are unchanged from 3 months prior.  He also has a suppressed IgA level.  Kappa/lambda light chain ratio has trended up slightly to 2.17.  He has mild renal insufficiency, but no other evidence of endorgan damage.  Metastatic bone survey on May 20, 2019 reviewed independently with no obvious bony lesions.  Bone marrow biopsy completed on May 28, 2019 revealed only 10 to 15% plasma cells with no clonality reported.   Cytogenetics were also reported as normal.  Patient's most recent M spike has trended down to 1.2.  IgG component has also trended down to 1782.  No intervention is needed at this time.  Repeat laboratory work from today is pending at time of dictation.  Return to clinic as above.  3.  Iron deficiency anemia: Hemoglobin has trended up slightly to 10.3.  He last received IV Feraheme on October 06, 2019.   4.  Renal insufficiency: Chronic and unchanged.  Patient's creatinine is 1.46. 5.  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  Improved.  Unclear if patient continues to take gabapentin.  Monitor. 6.  Leukopenia: Mild, monitor. 7. Thrombocytopenia: Improved.  Patient's platelet count is 131 today. 8.  Hypokalemia: Chronic and unchanged.  Continue dietary changes.  Patient expressed understanding and was in agreement with this plan. He also understands that He can call clinic at any time with any questions, concerns, or complaints.    Lloyd Huger, MD   06/15/2021 11:39 AM

## 2021-06-16 LAB — KAPPA/LAMBDA LIGHT CHAINS
Kappa free light chain: 48.1 mg/L — ABNORMAL HIGH (ref 3.3–19.4)
Kappa, lambda light chain ratio: 2 — ABNORMAL HIGH (ref 0.26–1.65)
Lambda free light chains: 24 mg/L (ref 5.7–26.3)

## 2021-06-16 LAB — IGG, IGA, IGM
IgA: 60 mg/dL — ABNORMAL LOW (ref 61–437)
IgG (Immunoglobin G), Serum: 1822 mg/dL — ABNORMAL HIGH (ref 603–1613)
IgM (Immunoglobulin M), Srm: 64 mg/dL (ref 20–172)

## 2021-06-16 LAB — CEA: CEA: 204 ng/mL — ABNORMAL HIGH (ref 0.0–4.7)

## 2021-06-17 LAB — PROTEIN ELECTROPHORESIS, SERUM
A/G Ratio: 0.8 (ref 0.7–1.7)
Albumin ELP: 2.8 g/dL — ABNORMAL LOW (ref 2.9–4.4)
Alpha-1-Globulin: 0.2 g/dL (ref 0.0–0.4)
Alpha-2-Globulin: 0.5 g/dL (ref 0.4–1.0)
Beta Globulin: 0.9 g/dL (ref 0.7–1.3)
Gamma Globulin: 1.7 g/dL (ref 0.4–1.8)
Globulin, Total: 3.4 g/dL (ref 2.2–3.9)
M-Spike, %: 1.3 g/dL — ABNORMAL HIGH
Total Protein ELP: 6.2 g/dL (ref 6.0–8.5)

## 2021-06-22 ENCOUNTER — Other Ambulatory Visit: Payer: Self-pay

## 2021-06-22 ENCOUNTER — Ambulatory Visit
Admission: RE | Admit: 2021-06-22 | Discharge: 2021-06-22 | Disposition: A | Discharge status: Home / Self Care | Payer: Medicare HMO | Source: Ambulatory Visit | Attending: Oncology | Admitting: Oncology

## 2021-06-22 DIAGNOSIS — C9000 Multiple myeloma not having achieved remission: Secondary | ICD-10-CM | POA: Insufficient documentation

## 2021-06-22 DIAGNOSIS — C186 Malignant neoplasm of descending colon: Secondary | ICD-10-CM | POA: Diagnosis not present

## 2021-06-22 DIAGNOSIS — I517 Cardiomegaly: Secondary | ICD-10-CM | POA: Diagnosis not present

## 2021-06-22 DIAGNOSIS — R590 Localized enlarged lymph nodes: Secondary | ICD-10-CM | POA: Insufficient documentation

## 2021-06-22 DIAGNOSIS — I700 Atherosclerosis of aorta: Secondary | ICD-10-CM | POA: Insufficient documentation

## 2021-06-22 DIAGNOSIS — I2510 Atherosclerotic heart disease of native coronary artery without angina pectoris: Secondary | ICD-10-CM | POA: Insufficient documentation

## 2021-06-22 LAB — GLUCOSE, CAPILLARY: Glucose-Capillary: 107 mg/dL — ABNORMAL HIGH (ref 70–99)

## 2021-06-22 MED ORDER — FLUDEOXYGLUCOSE F - 18 (FDG) INJECTION
12.3800 | Freq: Once | INTRAVENOUS | Status: AC | PRN
  Administered 2021-06-22: 11.7 via INTRAVENOUS

## 2021-06-23 ENCOUNTER — Other Ambulatory Visit: Payer: Self-pay | Admitting: Oncology

## 2021-06-23 NOTE — Progress Notes (Signed)
[place]  [phone] [phone]  ID: Chris George OB: 1956/09/21  MR#: [id number]  IRJ#:[id number]  Patient Care Team: Sofie Hartigan, MD as PCP - General (Family Medicine) Lloyd Huger, MD as Consulting Physician (Oncology) Anabel Bene, MD as Referring Physician (Neurology)  CHIEF COMPLAINT: Recurrent colon cancer, smoldering myeloma.  INTERVAL HISTORY: Patient returns to clinic today to discuss his imaging and laboratory results.  He continues to have a peripheral neuropathy, but otherwise feels well.  He has no other neurologic complaints. He has no chest pain, shortness of breath, cough, or hemoptysis.  He denies any nausea, vomiting, constipation, or diarrhea.  He has no melena or hematochezia.  He has no urinary complaints.  Patient offers no further specific complaints today.  REVIEW OF SYSTEMS:   Review of Systems  Constitutional: Negative.  Negative for fever, malaise/fatigue and weight loss.  Respiratory: Negative.  Negative for cough, hemoptysis and shortness of breath.   Cardiovascular: Negative.  Negative for chest pain and leg swelling.  Gastrointestinal: Negative.  Negative for abdominal pain, blood in stool and melena.  Genitourinary: Negative.  Negative for hematuria.  Musculoskeletal: Negative.  Negative for back pain.  Skin: Negative.  Negative for rash.  Neurological:  Positive for tingling and sensory change. Negative for dizziness, focal weakness, weakness and headaches.  Psychiatric/Behavioral: Negative.  Negative for depression. The patient is not nervous/anxious.    As per HPI. Otherwise, a complete review of systems is negative.  PAST MEDICAL HISTORY: Past Medical History:  Diagnosis Date   A-fib ([place])    Anemia    Arthritis    ankles   Cancer (HCC)    multiple myeloma   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[place]) 10/26/2019   Cardiomyopathy ([place])    CHF (congestive heart failure) (HCC)    Depression     Dysrhythmia    atrial fib   HLD (hyperlipidemia)    Hypercholesteremia    Hypertension    Moderate tricuspid insufficiency    Multiple myeloma (HCC)    not being treated right now per pt 11/11/19   Pneumonia    Sleep apnea    No CPAP    PAST SURGICAL HISTORY: Past Surgical History:  Procedure Laterality Date   ATRIAL ABLATION SURGERY     CARDIAC SURGERY     A-Fib Ablations   COLONOSCOPY WITH PROPOFOL N/A 10/26/2019   Procedure: COLONOSCOPY WITH BIOPSY;  Surgeon: Lucilla Lame, MD;  Location: [facility];  Service: Endoscopy;  Laterality: N/A;   ESOPHAGOGASTRODUODENOSCOPY (EGD) WITH PROPOFOL N/A 10/26/2019   Procedure: ESOPHAGOGASTRODUODENOSCOPY (EGD) WITH BIOPSY;  Surgeon: Lucilla Lame, MD;  Location: [facility];  Service: Endoscopy;  Laterality: N/A;  sleep apnea   LAPAROSCOPIC PARTIAL COLECTOMY Right 11/17/2019   Procedure: LAPAROSCOPIC PARTIAL COLECTOMY RIGHT EXTENDED;  Surgeon: Olean Ree, MD;  Location: [facility];  Service: General;  Laterality: Right;   PORTACATH PLACEMENT N/A 12/28/2019   Procedure: INSERTION PORT-A-CATH Left subclavian;  Surgeon: Olean Ree, MD;  Location: [facility];  Service: General;  Laterality: N/A;    FAMILY HISTORY: Family History  Problem Relation Age of Onset   Healthy Mother     ADVANCED DIRECTIVES (Y/N):  N  HEALTH MAINTENANCE: Social History   Tobacco Use   Smoking status: Never   Smokeless tobacco: Never  Vaping Use   Vaping Use: Never used  Substance Use Topics   Alcohol use: Not Currently   Drug use: Never     Colonoscopy:  PAP:  Bone density:  Lipid panel:  No Known Allergies  Current Outpatient Medications  Medication Sig Dispense Refill   APPLE CIDER VINEGAR PO Take 30-45 mLs by mouth every other day.      aspirin 325 MG EC tablet Take 325 mg by mouth every other day. In the morning     digoxin (LANOXIN) 0.25 MG tablet Take 0.25 mg by mouth daily.      furosemide (LASIX) 80 MG tablet Take 80 mg  by mouth 2 (two) times daily. Morning & late afternoon     lidocaine-prilocaine (EMLA) cream Apply to affected area once 30 g 3   Misc Natural Products (TART CHERRY ADVANCED PO) Take 60 mLs by mouth every other day.     potassium bicarbonate (K-LYTE) 25 MEQ disintegrating tablet Take 25 mEq by mouth 4 (four) times a week. In the afternoon.     No current facility-administered medications for this visit.    OBJECTIVE: Vitals:   06/27/21 1137  BP: 109/69  Pulse: 77  Resp: 18  Temp: 97.6 F (36.4 C)  SpO2: 98%      Body mass index is 34.7 kg/m.    ECOG FS:0 - Asymptomatic  General: Well-developed, well-nourished, no acute distress. Eyes: Pink conjunctiva, anicteric sclera. HEENT: Normocephalic, moist mucous membranes. Lungs: No audible wheezing or coughing. Heart: Regular rate and rhythm. Abdomen: Soft, nontender, no obvious distention. Musculoskeletal: No edema, cyanosis, or clubbing. Neuro: Alert, answering all questions appropriately. Cranial nerves grossly intact. Skin: No rashes or petechiae noted. Psych: Normal affect.  LAB RESULTS:  Lab Results  Component Value Date   NA 137 06/15/2021   K 3.1 (L) 06/15/2021   CL 101 06/15/2021   CO2 28 06/15/2021   GLUCOSE 108 (H) 06/15/2021   BUN 22 06/15/2021   CREATININE 1.46 (H) 06/15/2021   CALCIUM 8.3 (L) 06/15/2021   PROT 6.9 06/15/2021   ALBUMIN 3.1 (L) 06/15/2021   AST 34 06/15/2021   ALT 20 06/15/2021   ALKPHOS 117 06/15/2021   BILITOT 0.9 06/15/2021   GFRNONAA 53 (L) 06/15/2021   GFRAA >60 07/29/2020    Lab Results  Component Value Date   WBC 3.5 (L) 06/15/2021   NEUTROABS 2.4 06/15/2021   HGB 10.3 (L) 06/15/2021   HCT 33.0 (L) 06/15/2021   MCV 90.9 06/15/2021   PLT 131 (L) 06/15/2021   Lab Results  Component Value Date   IRON 41 (L) 05/11/2020   TIBC 323 05/11/2020   IRONPCTSAT 13 (L) 05/11/2020   Lab Results  Component Value Date   FERRITIN 86 05/11/2020     STUDIES: CT Abdomen Pelvis W  Contrast  Result Date: 06/08/2021 CLINICAL DATA:  Follow-up colon carcinoma. Previous surgery and chemotherapy. Cirrhosis. EXAM: CT ABDOMEN AND PELVIS WITH CONTRAST TECHNIQUE: Multidetector CT imaging of the abdomen and pelvis was performed using the standard protocol following bolus administration of intravenous contrast. CONTRAST:  63mL OMNIPAQUE IOHEXOL 350 MG/ML SOLN COMPARISON:  08/09/2020 FINDINGS: Lower Chest: Stable moderate cardiomegaly. Hepatobiliary: Stable appearance of hepatic cirrhosis. Distension of IVC and hepatic veins again demonstrated. No hepatic masses identified. Gallbladder is unremarkable. No evidence of biliary ductal dilatation. Pancreas:  No mass or inflammatory changes. Spleen: Within normal limits in size and appearance. Adrenals/Urinary Tract: No masses identified. Tiny cyst again noted in lower pole of left kidney. No evidence of ureteral calculi or hydronephrosis. Stomach/Bowel: Stable postop changes from previous right colectomy. No soft tissue masses identified. No evidence of obstruction, inflammatory process or abnormal fluid collections. Vascular/Lymphatic: Retroperitoneal  lymphadenopathy in the aortocaval space is increased, currently measuring 2.0 cm in short axis on image 34/2 compared to 1.1 cm previously. No other sites of lymphadenopathy identified. 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 No acute vascular findings. Reproductive:  No mass or other significant abnormality. Other:  None. Musculoskeletal:  No suspicious bone lesions identified. IMPRESSION: Mild increase in retroperitoneal lymphadenopathy in aortocaval space, suspicious for metastatic disease. No other sites of metastatic disease identified. Stable hepatic cirrhosis. No evidence of hepatic neoplasm. Electronically Signed   By: Marlaine Hind M.D.   On: 06/08/2021 09:42   NM PET Image Initial (PI) Skull Base To Thigh  Result Date: 06/23/2021 CLINICAL  DATA:  Subsequent treatment strategy for colon cancer and multiple myeloma. EXAM: NUCLEAR MEDICINE PET SKULL BASE TO THIGH TECHNIQUE: 11.7 mCi F-18 FDG was injected intravenously. Full-ring PET imaging was performed from the skull base to thigh after the radiotracer. CT data was obtained and used for attenuation correction and anatomic localization. Fasting blood glucose: 107 mg/dl COMPARISON:  06/07/2021 FINDINGS: Mediastinal blood pool activity: SUV max 3.17 Liver activity: SUV max NA NECK: No hypermetabolic lymph nodes in the neck. Incidental CT findings: none CHEST: No hypermetabolic mediastinal or hilar nodes. No suspicious pulmonary nodules on the CT scan. Incidental CT findings: Cardiac enlargement. Lad coronary artery calcifications. ABDOMEN/PELVIS: No abnormal hypermetabolic activity within the liver, pancreas, adrenal glands, or spleen. Enlarged aortocaval lymph node measures 2.3 cm and has an SUV max of 10.78, image 169/3. Incidental CT findings: Left pelvic varix is again noted along the sidewall, image 228/3. The liver has a nodular contour compatible with cirrhosis. Aortic atherosclerosis. SKELETON: No focal hypermetabolic activity to suggest skeletal metastasis. Incidental CT findings: none IMPRESSION: 1. The enlarged retroperitoneal lymph node is FDG avid measuring 2.3 cm within SUV max of 10.78. Finding is compatible with nodal metastasis. 2. No additional sites of FDG avid disease identified. 3. Morphologic features of liver compatible with cirrhosis. 4. Cardiac enlargement and LAD coronary artery calcifications. 5.  Aortic Atherosclerosis (ICD10-I70.0). Electronically Signed   By: Kerby Moors M.D.   On: 06/23/2021 15:28     ASSESSMENT: Stage IIIc colon cancer, smoldering myeloma.  PLAN:    1.  Stage IIIc colon cancer: Patient completed cycle 9 of adjuvant FOLFOX on 06/03/2020.  Given his difficulties with treatment and declining performance status, treatment was discontinued altogether.   Patient's last colonoscopy was on October 26, 2019.  He has not had a repeat procedure since completion of treatment.  CT scan results from June 08, 2021 reviewed independently with a mild increase in retroperitoneal lymphadenopathy.  PET scan from June 24, 2021 reviewed independently and reported as above with an enlarged retroperitoneal lymph node measuring 2.3 cm that is hypermetabolic.  No additional sites of hypermetabolism were noted.  Patient CEA has also increased to greater than 200.  This is clearly a recurrence, but appears to be minimal.  Will discuss patient at tumor board later this week to discuss optimal treatment options.  Patient will follow-up based on tumor board conversation.    2. 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  He was evaluated in clinic in July 2014 when he declined any maintenance treatment or referral for bone marrow transplant.  He was subsequently lost to follow-up.  His most recent SPEP revealed an M spike of 1.3 and an IgG component of  1822 which are unchanged from previous.  He also has a suppressed IgA level.  Kappa free light chains are increased to 48.1 which is essentially unchanged.  He has a mild renal insufficiency and anemia, but these are unchanged as well.  Metastatic bone survey on May 20, 2019 reviewed independently with no obvious bony lesions.  Bone marrow biopsy completed on May 28, 2019 revealed only 10 to 15% plasma cells with no clonality reported.  Cytogenetics were also reported as normal.  No intervention is needed at this time.  3.  Iron deficiency anemia: Chronic and unchanged.  Patient's most recent hemoglobin is 10.3.  He last received IV Feraheme on October 06, 2019.   4.  Renal insufficiency: Chronic and unchanged.  Patient's creatinine is 1.46. 5.  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  Improved.  Unclear if  patient continues to take gabapentin.  Monitor. 6.  Leukopenia: Mild, monitor. 7. Thrombocytopenia: Improved.  Patient's most recent platelet count is 131. 8.  Hypokalemia: Chronic and unchanged.  Continue dietary changes.    Patient expressed understanding and was in agreement with this plan. He also understands that He can call clinic at any time with any questions, concerns, or complaints.    Lloyd Huger, MD   06/27/2021 2:17 PM

## 2021-06-27 ENCOUNTER — Inpatient Hospital Stay (HOSPITAL_BASED_OUTPATIENT_CLINIC_OR_DEPARTMENT_OTHER): Payer: Medicare HMO | Admitting: Oncology

## 2021-06-27 VITALS — BP 109/69 | HR 77 | Temp 97.6°F | Resp 18 | Wt 235.0 lb

## 2021-06-27 DIAGNOSIS — C184 Malignant neoplasm of transverse colon: Secondary | ICD-10-CM

## 2021-06-27 DIAGNOSIS — C189 Malignant neoplasm of colon, unspecified: Secondary | ICD-10-CM | POA: Diagnosis not present

## 2021-06-27 DIAGNOSIS — C9001 Multiple myeloma in remission: Secondary | ICD-10-CM

## 2021-06-27 NOTE — Progress Notes (Signed)
Patient reports leg pain/neuropathy, lack of sleep, and cough he cant get rid of.

## 2021-06-29 ENCOUNTER — Telehealth: Payer: Self-pay | Admitting: *Deleted

## 2021-06-29 NOTE — Telephone Encounter (Signed)
Called pt per dr Grayland Ormond: pt had scan from June 24, 2021 reviewed independently and reported as above with an enlarged retroperitoneal lymph node measuring 2.3 cm that is hypermetabolic.  No additional sites of hypermetabolism were noted.  Patient CEA has also increased to greater than 200.  This is clearly a recurrence, but appears to be minimal.  Will discuss patient at tumor board later this week to discuss optimal treatment options.  Patient will follow-up based on tumor board conversation. After I spoke to pt about above results and tumor marker. It was rec: to have radiation onocology appt . Patient is ok with making appt and will have staff call back with appt.

## 2021-07-06 NOTE — Telephone Encounter (Signed)
The forms have now  been completed and faxed to [facility]

## 2021-07-07 ENCOUNTER — Encounter: Payer: Self-pay | Admitting: Radiation Oncology

## 2021-07-07 ENCOUNTER — Ambulatory Visit
Admission: RE | Admit: 2021-07-07 | Discharge: 2021-07-07 | Disposition: A | Discharge status: Home / Self Care | Payer: Medicare HMO | Source: Ambulatory Visit | Attending: Radiation Oncology | Admitting: Radiation Oncology

## 2021-07-07 VITALS — BP 117/49 | HR 52 | Temp 96.2°F | Wt 231.0 lb

## 2021-07-07 DIAGNOSIS — I071 Rheumatic tricuspid insufficiency: Secondary | ICD-10-CM | POA: Insufficient documentation

## 2021-07-07 DIAGNOSIS — C184 Malignant neoplasm of transverse colon: Secondary | ICD-10-CM | POA: Diagnosis not present

## 2021-07-07 DIAGNOSIS — I509 Heart failure, unspecified: Secondary | ICD-10-CM | POA: Diagnosis not present

## 2021-07-07 DIAGNOSIS — I10 Essential (primary) hypertension: Secondary | ICD-10-CM | POA: Diagnosis not present

## 2021-07-07 DIAGNOSIS — I4891 Unspecified atrial fibrillation: Secondary | ICD-10-CM | POA: Insufficient documentation

## 2021-07-07 DIAGNOSIS — I429 Cardiomyopathy, unspecified: Secondary | ICD-10-CM | POA: Diagnosis not present

## 2021-07-07 DIAGNOSIS — Z79899 Other long term (current) drug therapy: Secondary | ICD-10-CM | POA: Insufficient documentation

## 2021-07-07 DIAGNOSIS — E7800 Pure hypercholesterolemia, unspecified: Secondary | ICD-10-CM | POA: Diagnosis not present

## 2021-07-07 DIAGNOSIS — Z9221 Personal history of antineoplastic chemotherapy: Secondary | ICD-10-CM | POA: Insufficient documentation

## 2021-07-07 DIAGNOSIS — G629 Polyneuropathy, unspecified: Secondary | ICD-10-CM | POA: Insufficient documentation

## 2021-07-07 DIAGNOSIS — E785 Hyperlipidemia, unspecified: Secondary | ICD-10-CM | POA: Diagnosis not present

## 2021-07-07 DIAGNOSIS — C9000 Multiple myeloma not having achieved remission: Secondary | ICD-10-CM | POA: Diagnosis not present

## 2021-07-07 DIAGNOSIS — C786 Secondary malignant neoplasm of retroperitoneum and peritoneum: Secondary | ICD-10-CM | POA: Insufficient documentation

## 2021-07-07 NOTE — Consult Note (Signed)
NEW PATIENT EVALUATION  Name: Chris George  MRN: [id number]  Date:   07/07/2021     DOB: 1956/10/02   This 65 y.o. male patient presents to the clinic for initial evaluation of recurrent colon cancer presenting as a solitary intra-abdominal node in patient status post right colectomy 2 years prior for locally advanced adenocarcinoma.  REFERRING PHYSICIAN: Sofie Hartigan, MD  CHIEF COMPLAINT:  Chief Complaint  Patient presents with   Consult   Colon Cancer    DIAGNOSIS: The encounter diagnosis was 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[place]).   PREVIOUS INVESTIGATIONS:  PET CT scans reviewed Pathology report reviewed Clinical notes reviewed  HPI: Patient is a 65 year old male had a colonoscopy proximately back in November 2020.  Found to have a right colon mass status post right colectomy showing invasive moderately differentiated adenocarcinoma.  7 of 35 lymph nodes involved metastatic disease.  Tumor was 6 cm grade 2 moderately differentiated invading through the muscularis propria into pericolorectal tissue.  Margins were uninvolved.  He underwent FOLFOX chemotherapy completed 9 cycles although had difficulty with his performance status and treatment was discontinued.  He was discontinued in July.  His most recent CEA has been elevated from 10 months ago it was 13 now up to 204.  Recent CT scan and PET CT scan showed enlarged retroperitoneal lymph node which is hypermetabolic with an SUV of 28.3 compatible with nodal metastatic disease.  No additional sites of avid disease was identified.  He was presented at our tumor conference and I recommended SBRT.  He is seen today for evaluation.  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  Specifically denies marked abdominal pain or GI upset.  Patient is being treated by Dr. Grayland Ormond for multiple myeloma.  PLANNED TREATMENT REGIMEN: SBRT  PAST MEDICAL HISTORY:  has a past medical  history of A-fib ([place]), Anemia, Arthritis, Cancer ([place]), 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[place]) (10/26/2019), Cardiomyopathy ([place]), CHF (congestive heart failure) ([place]), Depression, Dysrhythmia, HLD (hyperlipidemia), Hypercholesteremia, Hypertension, Moderate tricuspid insufficiency, Multiple myeloma ([place]), Pneumonia, and Sleep apnea.    PAST SURGICAL HISTORY:  Past Surgical History:  Procedure Laterality Date   ATRIAL ABLATION SURGERY     CARDIAC SURGERY     A-Fib Ablations   COLONOSCOPY WITH PROPOFOL N/A 10/26/2019   Procedure: COLONOSCOPY WITH BIOPSY;  Surgeon: Lucilla Lame, MD;  Location: [facility];  Service: Endoscopy;  Laterality: N/A;   ESOPHAGOGASTRODUODENOSCOPY (EGD) WITH PROPOFOL N/A 10/26/2019   Procedure: ESOPHAGOGASTRODUODENOSCOPY (EGD) WITH BIOPSY;  Surgeon: Lucilla Lame, MD;  Location: [facility];  Service: Endoscopy;  Laterality: N/A;  sleep apnea   LAPAROSCOPIC PARTIAL COLECTOMY Right 11/17/2019   Procedure: LAPAROSCOPIC PARTIAL COLECTOMY RIGHT EXTENDED;  Surgeon: Olean Ree, MD;  Location: [facility];  Service: General;  Laterality: Right;   PORTACATH PLACEMENT N/A 12/28/2019   Procedure: INSERTION PORT-A-CATH Left subclavian;  Surgeon: Olean Ree, MD;  Location: [facility];  Service: General;  Laterality: N/A;    FAMILY HISTORY: family history includes Healthy in his mother.  SOCIAL HISTORY:  reports that he has never smoked. He has never used smokeless tobacco. He reports that he does not currently use alcohol. He reports that he does not use drugs.  ALLERGIES: Patient has no known allergies.  MEDICATIONS:  Current Outpatient Medications  Medication Sig Dispense Refill   APPLE CIDER VINEGAR PO Take 30-45 mLs by mouth every other day.      aspirin 325  MG EC tablet Take 325 mg by mouth every other day. In the morning     cefdinir (OMNICEF) 300 MG capsule Take 1 capsule by mouth in the morning and at bedtime.     digoxin (LANOXIN) 0.25 MG tablet Take 0.25  mg by mouth daily.      furosemide (LASIX) 80 MG tablet Take 80 mg by mouth 2 (two) times daily. Morning & late afternoon     lidocaine-prilocaine (EMLA) cream Apply to affected area once 30 g 3   Misc Natural Products (TART CHERRY ADVANCED PO) Take 60 mLs by mouth every other day.     potassium bicarbonate (K-LYTE) 25 MEQ disintegrating tablet Take 25 mEq by mouth 4 (four) times a week. In the afternoon.     No current facility-administered medications for this encounter.    ECOG PERFORMANCE STATUS:  0 - Asymptomatic  REVIEW OF SYSTEMS: Patient is under treatment and has had multiple myeloma.  Patient denies any weight loss, fatigue, weakness, fever, chills or night sweats. Patient denies any loss of vision, blurred vision. Patient denies any ringing  of the ears or hearing loss. No irregular heartbeat. Patient denies heart murmur or history of fainting. Patient denies any chest pain or pain radiating to her upper extremities. Patient denies any shortness of breath, difficulty breathing at night, cough or hemoptysis. Patient denies any swelling in the lower legs. Patient denies any nausea vomiting, vomiting of blood, or coffee ground material in the vomitus. Patient denies any stomach pain. Patient states has had normal bowel movements no significant constipation or diarrhea. Patient denies any dysuria, hematuria or significant nocturia. Patient denies any problems walking, swelling in the joints or loss of balance. Patient denies any skin changes, loss of hair or loss of weight. Patient denies any excessive worrying or anxiety or significant depression. Patient denies any problems with insomnia. Patient denies excessive thirst, polyuria, polydipsia. Patient denies any swollen glands, patient denies easy bruising or easy bleeding. Patient denies any recent infections, allergies or URI. Patient "s visual fields have not changed significantly in recent time.   PHYSICAL EXAM: BP (!) 117/49   Pulse (!)  52   Temp (!) 96.2 F (35.7 C) (Tympanic)   Wt 231 lb (104.8 kg)   BMI 34.11 kg/m  Well-developed well-nourished patient in NAD. HEENT reveals PERLA, EOMI, discs not visualized.  Oral cavity is clear. No oral mucosal lesions are identified. Neck is clear without evidence of cervical or supraclavicular adenopathy. Lungs are clear to A&P. Cardiac examination is essentially unremarkable with regular rate and rhythm without murmur rub or thrill. Abdomen is benign with no organomegaly or masses noted. Motor sensory and DTR levels are equal and symmetric in the upper and lower extremities. Cranial nerves II through XII are grossly intact. Proprioception is intact. No peripheral adenopathy or edema is identified. No motor or sensory levels are noted. Crude visual fields are within normal range.  LABORATORY DATA: Pathology report reviewed    RADIOLOGY RESULTS: PET scan and CT scans reviewed compatible with above-stated findings   IMPRESSION: Solitary retroperitoneal lymph node hypermetabolic with rising CEA compatible with metastatic colon cancer in 65 year old male  PLAN: At this time elect to go ahead with SBRT to the solitary retroperitoneal lymph node.  Would plan on delivering 50 Gray in 5 fractions using SBRT.  We will also do PET fusion study.  Risks and benefits of treatment including possible GI upset possible duration of blood counts fatigue all and skin reaction all were  discussed in detail with the patient.  I have personally set up and ordered CT simulation.  Patient comprehends my recommendations well.  I would like to take this opportunity to thank you for allowing me to participate in the care of your patient.Noreene Filbert, MD

## 2021-07-14 ENCOUNTER — Telehealth: Payer: Self-pay | Admitting: *Deleted

## 2021-07-14 NOTE — Telephone Encounter (Signed)
Patient requesting a return call to let him know the status of his disability forms

## 2021-07-14 NOTE — Telephone Encounter (Signed)
The forms have already been submitted and he has talked with the insurance company.  Nothing further needed at this time.

## 2021-07-19 ENCOUNTER — Ambulatory Visit
Admission: RE | Admit: 2021-07-19 | Discharge: 2021-07-19 | Disposition: A | Discharge status: Home / Self Care | Payer: Medicare HMO | Source: Ambulatory Visit | Attending: Radiation Oncology | Admitting: Radiation Oncology

## 2021-07-19 DIAGNOSIS — C184 Malignant neoplasm of transverse colon: Secondary | ICD-10-CM | POA: Diagnosis present

## 2021-07-19 DIAGNOSIS — Z510 Encounter for antineoplastic radiation therapy: Secondary | ICD-10-CM | POA: Insufficient documentation

## 2021-07-19 DIAGNOSIS — C772 Secondary and unspecified malignant neoplasm of intra-abdominal lymph nodes: Secondary | ICD-10-CM | POA: Insufficient documentation

## 2021-07-27 DIAGNOSIS — Z510 Encounter for antineoplastic radiation therapy: Secondary | ICD-10-CM | POA: Diagnosis not present

## 2021-07-31 ENCOUNTER — Ambulatory Visit
Admission: RE | Admit: 2021-07-31 | Discharge: 2021-07-31 | Disposition: A | Discharge status: Home / Self Care | Payer: Medicare HMO | Source: Ambulatory Visit | Attending: Radiation Oncology | Admitting: Radiation Oncology

## 2021-07-31 DIAGNOSIS — Z510 Encounter for antineoplastic radiation therapy: Secondary | ICD-10-CM | POA: Diagnosis not present

## 2021-08-02 ENCOUNTER — Ambulatory Visit
Admission: RE | Admit: 2021-08-02 | Discharge: 2021-08-02 | Disposition: A | Discharge status: Home / Self Care | Payer: Medicare HMO | Source: Ambulatory Visit | Attending: Radiation Oncology | Admitting: Radiation Oncology

## 2021-08-02 DIAGNOSIS — Z510 Encounter for antineoplastic radiation therapy: Secondary | ICD-10-CM | POA: Diagnosis not present

## 2021-08-03 ENCOUNTER — Ambulatory Visit: Payer: Medicare HMO

## 2021-08-07 ENCOUNTER — Ambulatory Visit
Admission: RE | Admit: 2021-08-07 | Discharge: 2021-08-07 | Disposition: A | Discharge status: Home / Self Care | Payer: Medicare HMO | Source: Ambulatory Visit | Attending: Radiation Oncology | Admitting: Radiation Oncology

## 2021-08-07 DIAGNOSIS — C772 Secondary and unspecified malignant neoplasm of intra-abdominal lymph nodes: Secondary | ICD-10-CM | POA: Insufficient documentation

## 2021-08-07 DIAGNOSIS — Z510 Encounter for antineoplastic radiation therapy: Secondary | ICD-10-CM | POA: Diagnosis not present

## 2021-08-07 DIAGNOSIS — C184 Malignant neoplasm of transverse colon: Secondary | ICD-10-CM | POA: Insufficient documentation

## 2021-08-07 DIAGNOSIS — E119 Type 2 diabetes mellitus without complications: Secondary | ICD-10-CM | POA: Insufficient documentation

## 2021-08-09 ENCOUNTER — Other Ambulatory Visit: Payer: Self-pay | Admitting: Nephrology

## 2021-08-09 ENCOUNTER — Ambulatory Visit
Admission: RE | Admit: 2021-08-09 | Discharge: 2021-08-09 | Disposition: A | Discharge status: Home / Self Care | Payer: Medicare HMO | Source: Ambulatory Visit | Attending: Radiation Oncology | Admitting: Radiation Oncology

## 2021-08-09 DIAGNOSIS — N1831 Chronic kidney disease, stage 3a: Secondary | ICD-10-CM

## 2021-08-09 DIAGNOSIS — E1122 Type 2 diabetes mellitus with diabetic chronic kidney disease: Secondary | ICD-10-CM

## 2021-08-09 DIAGNOSIS — Z510 Encounter for antineoplastic radiation therapy: Secondary | ICD-10-CM | POA: Diagnosis not present

## 2021-08-14 ENCOUNTER — Ambulatory Visit
Admission: RE | Admit: 2021-08-14 | Discharge: 2021-08-14 | Disposition: A | Discharge status: Home / Self Care | Payer: Medicare HMO | Source: Ambulatory Visit | Attending: Radiation Oncology | Admitting: Radiation Oncology

## 2021-08-14 DIAGNOSIS — Z510 Encounter for antineoplastic radiation therapy: Secondary | ICD-10-CM | POA: Diagnosis not present

## 2021-08-21 ENCOUNTER — Ambulatory Visit: Admission: RE | Admit: 2021-08-21 | Payer: Medicare HMO | Source: Ambulatory Visit

## 2021-08-31 ENCOUNTER — Encounter: Payer: Self-pay | Admitting: Oncology

## 2021-08-31 ENCOUNTER — Encounter: Payer: Self-pay | Admitting: *Deleted

## 2021-09-05 ENCOUNTER — Encounter: Payer: Self-pay | Admitting: Urology

## 2021-09-05 ENCOUNTER — Ambulatory Visit (INDEPENDENT_AMBULATORY_CARE_PROVIDER_SITE_OTHER): Payer: Medicare HMO | Admitting: Urology

## 2021-09-05 ENCOUNTER — Other Ambulatory Visit: Payer: Self-pay

## 2021-09-05 ENCOUNTER — Other Ambulatory Visit
Admission: RE | Admit: 2021-09-05 | Discharge: 2021-09-05 | Disposition: A | Discharge status: Home / Self Care | Payer: Medicare HMO | Attending: Urology | Admitting: Urology

## 2021-09-05 VITALS — BP 121/77 | HR 66 | Ht 69.0 in | Wt 232.2 lb

## 2021-09-05 DIAGNOSIS — R972 Elevated prostate specific antigen [PSA]: Secondary | ICD-10-CM | POA: Diagnosis present

## 2021-09-05 NOTE — Patient Instructions (Signed)
Prostate Cancer Screening Prostate cancer screening is a test that is done to check for the presence of prostate cancer in men. The prostate gland is a walnut-sized gland that is located below the bladder and in front of the rectum in males. The function of the prostate is to add fluid to semen during ejaculation. Prostate cancer is the second most common type of cancer in men. Who should have prostate cancer screening? Screening recommendations vary based on age and other risk factors. Screening is recommended if: You are older than age 69. If you are age 76-69, talk with your health care provider about your need for screening and how often screening should be done. Because most prostate cancers are slow growing and will not cause death, screening is generally reserved in this age group for men who have a 10-15-year life expectancy. You are younger than age 31, and you have these risk factors: Being a [place] male or a male of African descent. Having a father, brother, or uncle who has been diagnosed with prostate cancer. The risk is higher if your family member's cancer occurred at an early age. Screening is not recommended if: You are younger than age 50. You are between the ages of 3 and 16 and you have no risk factors. You are 74 years of age or older. At this age, the risks that screening can cause are greater than the benefits that it may provide. If you are at high risk for prostate cancer, your health care provider may recommend that you have screenings more often or that you start screening at a younger age. How is screening for prostate cancer done? The recommended prostate cancer screening test is a blood test called the prostate-specific antigen (PSA) test. PSA is a protein that is made in the prostate. As you age, your prostate naturally produces more PSA. Abnormally high PSA levels may be caused by: Prostate cancer. An enlarged prostate that is not caused by cancer (benign prostatic  hyperplasia, BPH). This condition is very common in older men. A prostate gland infection (prostatitis). Depending on the PSA results, you may need more tests, such as: A physical exam to check the size of your prostate gland. Blood and imaging tests. A procedure to remove tissue samples from your prostate gland for testing (biopsy). What are the benefits of prostate cancer screening? Screening can help to identify cancer at an early stage, before symptoms start and when the cancer can be treated more easily. There is a small chance that screening may lower your risk of dying from prostate cancer. The chance is small because prostate cancer is a slow-growing cancer, and most men with prostate cancer die from a different cause. What are the risks of prostate cancer screening? The main risk of prostate cancer screening is diagnosing and treating prostate cancer that would never have caused any symptoms or problems. This is called overdiagnosisand overtreatment. PSA screening cannot tell you if your PSA is high due to cancer or a different cause. A prostate biopsy is the only procedure to diagnose prostate cancer. Even the results of a biopsy may not tell you if your cancer needs to be treated. Slow-growing prostate cancer may not need any treatment other than monitoring, so diagnosing and treating it may cause unnecessary stress or other side effects. Questions to ask your health care provider When should I start prostate cancer screening? What is my risk for prostate cancer? How often do I need screening? What type of screening tests do  I need? How do I get my test results? What do my results mean? Do I need treatment? Where to find more information The American Cancer Society: www.cancer.org American Urological Association: www.auanet.org Contact a health care provider if: You have difficulty urinating. You have pain when you urinate or ejaculate. You have blood in your urine or semen. You  have pain in your back or in the area of your prostate. Summary Prostate cancer is a common type of cancer in men. The prostate gland is located below the bladder and in front of the rectum. This gland adds fluid to semen during ejaculation. Prostate cancer screening may identify cancer at an early stage, when the cancer can be treated more easily. The prostate-specific antigen (PSA) test is the recommended screening test for prostate cancer. Discuss the risks and benefits of prostate cancer screening with your health care provider. If you are age 36 or older, the risks that screening can cause are greater than the benefits that it may provide. This information is not intended to replace advice given to you by your health care provider. Make sure you discuss any questions you have with your health care provider. Document Revised: 12/23/2020 Document Reviewed: 06/04/2019 Elsevier Patient Education  [place] Antigen Test Why am I having this test? The prostate-specific antigen (PSA) test is a screening test for prostate cancer. It can identify early signs of prostate cancer, which may allow for more effective treatment. Your health care provider may recommend that you have a PSA test starting at age 60 or that you have one earlier or later, depending on your risk factors for prostate cancer. You may also have a PSA test: To monitor treatment of prostate cancer. To check whether prostate cancer has returned after treatment. If you have signs of other conditions that can affect PSA levels, such as: An enlarged prostate that is not caused by cancer (benign prostatic hyperplasia, BPH). This condition is very common in older men. A prostate infection. What is being tested? This test measures the amount of PSA in your blood. PSA is a protein that is made in the prostate. The prostate naturally produces more PSA as you age, but very high levels may be a sign of a medical  condition. What kind of sample is taken? A blood sample is required for this test. It is usually collected by inserting a needle into a blood vessel or by sticking a finger with a small needle. Blood for this test should be drawn before having an exam of the prostate. How do I prepare for this test? Do not ejaculate starting 24 hours before your test, or as long as told by your health care provider. Tell a health care provider about: Any allergies you have. All medicines you are taking, including vitamins, herbs, eye drops, creams, and over-the-counter medicines. This also includes: Medicines to assist with hair growth, such as finasteride. Any recent exposure to a medicine called diethylstilbestrol. Any blood disorders you have. Any recent procedures you have had, especially any procedures involving the prostate or rectum. Any medical conditions you have. Any recent urinary tract infections (UTIs) you have had. How are the results reported? Your test results will be reported as a value that indicates how much PSA is in your blood. This will be given as nanograms of PSA per milliliter of blood (ng/mL). Your health care provider will compare your results to normal ranges that were established after testing a large group of people (reference ranges).  Reference ranges may vary among labs and hospitals. PSA levels vary from person to person and generally increase with age. Because of this variation, there is no single PSA value that is considered normal for everyone. Instead, PSA reference ranges are used to describe whether your PSA levels are considered low or high (elevated). Common reference ranges are: Low: 0-2.5 ng/mL. Slightly to moderately elevated: 2.6-10.0 ng/mL. Moderately elevated: 10.0-19.9 ng/mL. Significantly elevated: 20 ng/mL or greater. Sometimes, the test results may report that a condition is present when it is not present (false-positive result). What do the results mean? A  test result that is higher than 4 ng/mL may mean that you are at an increased risk for prostate cancer. However, a PSA test by itself is not enough to diagnose prostate cancer. High PSA levels may also be caused by the natural aging process, prostate infection, or BPH. PSA screening cannot tell you if your PSA is high due to cancer or a different cause. A prostate biopsy is the only way to diagnose prostate cancer. A risk of having the PSA test is diagnosing and treating prostate cancer that would never have caused any symptoms or problems (overdiagnosis and overtreatment). Talk with your health care provider about what your results mean. Questions to ask your health care provider Ask your health care provider, or the department that is doing the test: When will my results be ready? How will I get my results? What are my treatment options? What other tests do I need? What are my next steps? Summary The prostate-specific antigen (PSA) test is a screening test for prostate cancer. Your health care provider may recommend that you have a PSA test starting at age 90 or that you have one earlier or later, depending on your risk factors for prostate cancer. A test result that is higher than 4 ng/mL may mean that you are at an increased risk for prostate cancer. However, elevated levels can be caused by a number of conditions other than prostate cancer. Talk with your health care provider about what your results mean. This information is not intended to replace advice given to you by your health care provider. Make sure you discuss any questions you have with your health care provider. Document Revised: 07/07/2020 Document Reviewed: 07/07/2020 Elsevier Patient Education  2022 [employer].

## 2021-09-05 NOTE — Progress Notes (Signed)
09/05/21 3:58 PM   Chris George [birth year] [id number]  CC: Elevated PSA  HPI: Complex 65 year old male with recent history of partial colectomy for colon cancer as well as adjuvant chemotherapy and radiation for recently discovered 2.3 cm retroperitoneal lymph node concerning for recurrence of colon cancer.  He also has a history of multiple myeloma diagnosed in 2012.  He is referred for an elevated PSA of 15, which increased from 4.3 in November 2021, and 5.7 in August 2020.  He has a family history of prostate cancer in his father and brother, and his brother was cured with radiation.  He thinks he had a history of a meatotomy when he was younger for urination, but those records are not available to me.  I reviewed the outside oncology notes.   PMH: Past Medical History:  Diagnosis Date   A-fib ([facility])    Anemia    Arthritis    ankles   Cancer ([place])    multiple myeloma   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[facility]) 10/26/2019   Cardiomyopathy ([place])    CHF (congestive heart failure) ([facility])    Depression    Dysrhythmia    atrial fib   HLD (hyperlipidemia)    Hypercholesteremia    Hypertension    Moderate tricuspid insufficiency    Multiple myeloma ([facility])    not being treated right now per pt 11/11/19   Pneumonia    Sleep apnea    No CPAP    Surgical History: Past Surgical History:  Procedure Laterality Date   ATRIAL ABLATION SURGERY     CARDIAC SURGERY     A-Fib Ablations   COLONOSCOPY WITH PROPOFOL N/A 10/26/2019   Procedure: COLONOSCOPY WITH BIOPSY;  Surgeon: Lucilla Lame, MD;  Location: [facility];  Service: Endoscopy;  Laterality: N/A;   ESOPHAGOGASTRODUODENOSCOPY (EGD) WITH PROPOFOL N/A 10/26/2019   Procedure: ESOPHAGOGASTRODUODENOSCOPY (EGD) WITH BIOPSY;  Surgeon: Lucilla Lame, MD;  Location: [facility];  Service: Endoscopy;  Laterality: N/A;  sleep apnea   LAPAROSCOPIC PARTIAL COLECTOMY Right 11/17/2019   Procedure: LAPAROSCOPIC PARTIAL COLECTOMY RIGHT  EXTENDED;  Surgeon: Olean Ree, MD;  Location: [facility];  Service: General;  Laterality: Right;   PORTACATH PLACEMENT N/A 12/28/2019   Procedure: INSERTION PORT-A-CATH Left subclavian;  Surgeon: Olean Ree, MD;  Location: [facility];  Service: General;  Laterality: N/A;      Family History: Family History  Problem Relation Age of Onset   Healthy Mother     Social History:  reports that he has never smoked. He has never used smokeless tobacco. He reports that he does not currently use alcohol. He reports that he does not use drugs.  Physical Exam: BP 121/77 (BP Location: Left Arm, Patient Position: Sitting, Cuff Size: Large)   Pulse 66   Ht _0  (1.753 m)   Wt 232 lb 3.2 oz (105.3 kg)   BMI 34.29 kg/m    Constitutional:  Alert and oriented, No acute distress. Cardiovascular: No clubbing, cyanosis, or edema. Respiratory: Normal respiratory effort, no increased work of breathing. GI: Abdomen is soft, nontender, nondistended, no abdominal masses DRE: Limited secondary to body habitus, but no nodules or masses  Laboratory Data: Reviewed, see HPI  Pertinent Imaging: I have personally viewed and interpreted the CT abdomen pelvis and PET scan from August 2022 showing a 2.5 cm retroperitoneal node suspected to be from metastatic colon cancer.  Assessment & Plan:   Complex 65 year old male with history of multiple myeloma diagnosed in 2012, and colon  cancer treated with partial colectomy in June 2021 followed by adjuvant chemotherapy, and most recently radiation for a 2.3 cm retroperitoneal node thought to be secondary to recurrent colon cancer.  Recent PSA significantly elevated at 15 from 4.3 previously.  Strong family history of prostate cancer.  I recommended by starting with a repeat PSA prior to considering prostate MRI or biopsy.  With his number of comorbidities and other malignancies, I would recommend a prostate MRI if PSA remains elevated on repeat today.  He understands  the possible need for biopsy in the future.  Repeat PSA today, call with results.  If remains elevated pursue prostate MRI  I spent 65 total minutes on the day of the encounter including pre-visit review of the medical record, face-to-face time with the patient, and post visit ordering of labs/imaging/tests.  Nickolas Madrid, MD 09/05/2021  [facility] [address], [place], [place] [address] [phone]

## 2021-09-06 LAB — PSA, TOTAL AND FREE
PSA, Free Pct: 5.9 %
PSA, Free: 0.82 ng/mL
Prostate Specific Ag, Serum: 13.9 ng/mL — ABNORMAL HIGH (ref 0.0–4.0)

## 2021-09-11 ENCOUNTER — Telehealth: Payer: Self-pay

## 2021-09-11 DIAGNOSIS — R972 Elevated prostate specific antigen [PSA]: Secondary | ICD-10-CM

## 2021-09-11 NOTE — Telephone Encounter (Signed)
-----   Message from Billey Co, MD sent at 09/11/2021 12:53 PM EST ----- PSA remains elevated at 14, would recommend prostate MRI as discussed in clinic, please order prostate MRI for elevated PSA and set up follow up in ~1 month to discuss results, thanks  Nickolas Madrid, MD 09/11/2021

## 2021-09-11 NOTE — Telephone Encounter (Signed)
Called pt informed him of the information below. Pt gave verbal understanding. MRI order placed. Appointment scheduled.

## 2021-09-15 ENCOUNTER — Ambulatory Visit: Payer: Medicare HMO | Admitting: Radiation Oncology

## 2021-09-25 ENCOUNTER — Other Ambulatory Visit: Payer: Self-pay

## 2021-09-25 ENCOUNTER — Ambulatory Visit
Admission: RE | Admit: 2021-09-25 | Discharge: 2021-09-25 | Disposition: A | Discharge status: Home / Self Care | Payer: Medicare HMO | Source: Ambulatory Visit | Attending: Radiation Oncology | Admitting: Radiation Oncology

## 2021-09-25 ENCOUNTER — Encounter: Payer: Self-pay | Admitting: Radiation Oncology

## 2021-09-25 VITALS — BP 130/78 | HR 105 | Temp 97.6°F | Wt 238.4 lb

## 2021-09-25 DIAGNOSIS — R972 Elevated prostate specific antigen [PSA]: Secondary | ICD-10-CM | POA: Diagnosis not present

## 2021-09-25 DIAGNOSIS — Z923 Personal history of irradiation: Secondary | ICD-10-CM | POA: Diagnosis not present

## 2021-09-25 DIAGNOSIS — C786 Secondary malignant neoplasm of retroperitoneum and peritoneum: Secondary | ICD-10-CM | POA: Insufficient documentation

## 2021-09-25 DIAGNOSIS — C184 Malignant neoplasm of transverse colon: Secondary | ICD-10-CM | POA: Insufficient documentation

## 2021-09-25 MED ORDER — AZITHROMYCIN 250 MG PO TABS: ORAL_TABLET | ORAL | 0 refills | Status: DC

## 2021-09-25 NOTE — Progress Notes (Signed)
Radiation Oncology Follow up Note  Name: Chris George   Date:   09/25/2021 MRN:  [id number] DOB: 01-15-1956    This 65 y.o. male presents to the clinic today for 1 month follow-up status post SBRT to the solitary intra-abdominal node and patient status post right colectomy 2 years prior for locally advanced adenocarcinoma.  REFERRING PROVIDER: Sofie Hartigan, MD  HPI: Patient is a 65 year old male now at 1 month having completed SBRT to a solitary intra-abdominal node in patient status post colectomy 2 years prior for local advanced adenocarcinoma.  Seen today in routine follow-up he is doing well he does have a cold.  No fever negative COVID test.  He has no abdominal complaints specifically [place] abdominal pain or diarrhea.  He recently spent test and has an elevated PSA is being worked up for possible prostate cancer..  COMPLICATIONS OF TREATMENT: none  FOLLOW UP COMPLIANCE: keeps appointments   PHYSICAL EXAM:  BP 130/78   Pulse (!) 105   Temp 97.6 F (36.4 C) (Tympanic)   Wt 238 lb 6.4 oz (108.1 kg)   BMI 35.21 kg/m  Well-developed well-nourished patient in NAD. HEENT reveals PERLA, EOMI, discs not visualized.  Oral cavity is clear. No oral mucosal lesions are identified. Neck is clear without evidence of cervical or supraclavicular adenopathy. Lungs are clear to A&P. Cardiac examination is essentially unremarkable with regular rate and rhythm without murmur rub or thrill. Abdomen is benign with no organomegaly or masses noted. Motor sensory and DTR levels are equal and symmetric in the upper and lower extremities. Cranial nerves II through XII are grossly intact. Proprioception is intact. No peripheral adenopathy or edema is identified. No motor or sensory levels are noted. Crude visual fields are within normal range.  RADIOLOGY RESULTS: No current films for review  PLAN: Present time patient has low side effect profile from his abdominal SBRT.  I have put him on a Z-Pak for his  cold.  Also of asked him to contact me should he turn test positive for adenocarcinoma the prostate.  Could discuss treatment options at that time.  Otherwise asked to see him back in 3 to 4 months for follow-up.  Patient knows to call with any concerns.  I would like to take this opportunity to thank you for allowing me to participate in the care of your patient.Noreene Filbert, MD

## 2021-10-02 ENCOUNTER — Telehealth: Payer: Self-pay | Admitting: Oncology

## 2021-10-02 NOTE — Telephone Encounter (Signed)
Pt called to cancel his appt. Sick with cold or flu. Please call to reschedule at [phone]

## 2021-10-02 NOTE — Progress Notes (Deleted)
Chris George  [phone] [phone]) [phone]  ID: Chris George OB: 02-17-1956  MR#: [id number]  [id number]#:[id number]  Patient Care Team: Chris Hartigan, MD as PCP - General (Family Medicine) Chris Huger, MD as Consulting Physician (Oncology) Chris Bene, MD as Referring Physician (Neurology)  CHIEF COMPLAINT: Recurrent colon cancer, smoldering myeloma.  INTERVAL HISTORY: Patient returns to clinic today to discuss his imaging and laboratory results.  He continues to have a peripheral neuropathy, but otherwise feels well.  He has no other neurologic complaints. He has no chest pain, shortness of breath, cough, or hemoptysis.  He denies any nausea, vomiting, constipation, or diarrhea.  He has no melena or hematochezia.  He has no urinary complaints.  Patient offers no further specific complaints today.  REVIEW OF SYSTEMS:   Review of Systems  Constitutional: Negative.  Negative for fever, malaise/fatigue and weight loss.  Respiratory: Negative.  Negative for cough, hemoptysis and shortness of breath.   Cardiovascular: Negative.  Negative for chest pain and leg swelling.  Gastrointestinal: Negative.  Negative for abdominal pain, blood in stool and melena.  Genitourinary: Negative.  Negative for hematuria.  Musculoskeletal: Negative.  Negative for back pain.  Skin: Negative.  Negative for rash.  Neurological:  Positive for tingling and sensory change. Negative for dizziness, focal weakness, weakness and headaches.  Psychiatric/Behavioral: Negative.  Negative for depression. The patient is not nervous/anxious.    As per HPI. Otherwise, a complete review of systems is negative.  PAST MEDICAL HISTORY: Past Medical History:  Diagnosis Date   A-fib ([place])    Anemia    Arthritis    ankles   Cancer (HCC)    multiple myeloma   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[place]) 10/26/2019   Cardiomyopathy ([place])    CHF (congestive heart failure) (HCC)    Depression     Dysrhythmia    atrial fib   HLD (hyperlipidemia)    Hypercholesteremia    Hypertension    Moderate tricuspid insufficiency    Multiple myeloma (HCC)    not being treated right now per pt 11/11/19   Pneumonia    Sleep apnea    No CPAP    PAST SURGICAL HISTORY: Past Surgical History:  Procedure Laterality Date   ATRIAL ABLATION SURGERY     CARDIAC SURGERY     A-Fib Ablations   COLONOSCOPY WITH PROPOFOL N/A 10/26/2019   Procedure: COLONOSCOPY WITH BIOPSY;  Surgeon: Chris Lame, MD;  Location: [facility];  Service: Endoscopy;  Laterality: N/A;   ESOPHAGOGASTRODUODENOSCOPY (EGD) WITH PROPOFOL N/A 10/26/2019   Procedure: ESOPHAGOGASTRODUODENOSCOPY (EGD) WITH BIOPSY;  Surgeon: Chris Lame, MD;  Location: [facility];  Service: Endoscopy;  Laterality: N/A;  sleep apnea   LAPAROSCOPIC PARTIAL COLECTOMY Right 11/17/2019   Procedure: LAPAROSCOPIC PARTIAL COLECTOMY RIGHT EXTENDED;  Surgeon: Chris Ree, MD;  Location: [facility];  Service: General;  Laterality: Right;   PORTACATH PLACEMENT N/A 12/28/2019   Procedure: INSERTION PORT-A-CATH Left subclavian;  Surgeon: Chris Ree, MD;  Location: [facility];  Service: General;  Laterality: N/A;    FAMILY HISTORY: Family History  Problem Relation Age of Onset   Healthy Mother     ADVANCED DIRECTIVES (Y/N):  N  HEALTH MAINTENANCE: Social History   Tobacco Use   Smoking status: Never   Smokeless tobacco: Never  Vaping Use   Vaping Use: Never used  Substance Use Topics   Alcohol use: Not Currently   Drug use: Never     Colonoscopy:  PAP:  Bone density:  Lipid panel:  No Known Allergies  Current Outpatient Medications  Medication Sig Dispense Refill   APPLE CIDER VINEGAR PO Take 30-45 mLs by mouth every other day.      aspirin 325 MG EC tablet Take 325 mg by mouth every other day. In the morning     azithromycin (ZITHROMAX) 250 MG tablet 1 pack, follow package directions 6 each 0   digoxin (LANOXIN) 0.25 MG  tablet Take 0.25 mg by mouth daily.      furosemide (LASIX) 80 MG tablet Take 80 mg by mouth 2 (two) times daily. Morning & late afternoon     gabapentin (NEURONTIN) 300 MG capsule Take by mouth.     Misc Natural Products (TART CHERRY ADVANCED PO) Take 60 mLs by mouth every other day.     potassium bicarbonate (K-LYTE) 25 MEQ disintegrating tablet Take 25 mEq by mouth 4 (four) times a week. In the afternoon.     spironolactone (ALDACTONE) 25 MG tablet Take by mouth.     No current facility-administered medications for this visit.    OBJECTIVE: There were no vitals filed for this visit.     There is no height or weight on file to calculate BMI.    ECOG FS:0 - Asymptomatic  General: Well-developed, well-nourished, no acute distress. Eyes: Pink conjunctiva, anicteric sclera. HEENT: Normocephalic, moist mucous membranes. Lungs: No audible wheezing or coughing. Heart: Regular rate and rhythm. Abdomen: Soft, nontender, no obvious distention. Musculoskeletal: No edema, cyanosis, or clubbing. Neuro: Alert, answering all questions appropriately. Cranial nerves grossly intact. Skin: No rashes or petechiae noted. Psych: Normal affect.  LAB RESULTS:  Lab Results  Component Value Date   NA 137 06/15/2021   K 3.1 (L) 06/15/2021   CL 101 06/15/2021   CO2 28 06/15/2021   GLUCOSE 108 (H) 06/15/2021   BUN 22 06/15/2021   CREATININE 1.46 (H) 06/15/2021   CALCIUM 8.3 (L) 06/15/2021   PROT 6.9 06/15/2021   ALBUMIN 3.1 (L) 06/15/2021   AST 34 06/15/2021   ALT 20 06/15/2021   ALKPHOS 117 06/15/2021   BILITOT 0.9 06/15/2021   GFRNONAA 53 (L) 06/15/2021   GFRAA >60 07/29/2020    Lab Results  Component Value Date   WBC 3.5 (L) 06/15/2021   NEUTROABS 2.4 06/15/2021   HGB 10.3 (L) 06/15/2021   HCT 33.0 (L) 06/15/2021   MCV 90.9 06/15/2021   PLT 131 (L) 06/15/2021   Lab Results  Component Value Date   IRON 41 (L) 05/11/2020   TIBC 323 05/11/2020   IRONPCTSAT 13 (L) 05/11/2020   Lab  Results  Component Value Date   FERRITIN 86 05/11/2020     STUDIES: No results found.   ASSESSMENT: Stage IIIc colon cancer, smoldering myeloma.  PLAN:    1.  Stage IIIc colon cancer: Patient completed cycle 9 of adjuvant FOLFOX on 06/03/2020.  Given his difficulties with treatment and declining performance status, treatment was discontinued altogether.  Patient's last colonoscopy was on October 26, 2019.  He has not had a repeat procedure since completion of treatment.  CT scan results from June 08, 2021 reviewed independently with a mild increase in retroperitoneal lymphadenopathy.  PET scan from June 24, 2021 reviewed independently and reported as above with an enlarged retroperitoneal lymph node measuring 2.3 cm that is hypermetabolic.  No additional sites of hypermetabolism were noted.  Patient CEA has also increased to greater than 200.  This is clearly a recurrence, but appears to be minimal.  Will discuss patient at tumor board later this week to discuss optimal treatment options.  Patient will follow-up based on tumor board conversation.    2. 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  He was evaluated in clinic in July 2014 when he declined any maintenance treatment or referral for bone marrow transplant.  He was subsequently lost to follow-up.  His most recent SPEP revealed an M spike of 1.3 and an IgG component of 1822 which are unchanged from previous.  He also has a suppressed IgA level.  Kappa free light chains are increased to 48.1 which is essentially unchanged.  He has a mild renal insufficiency and anemia, but these are unchanged as well.  Metastatic bone survey on May 20, 2019 reviewed independently with no obvious bony lesions.  Bone marrow biopsy completed on May 28, 2019 revealed only 10 to 15% plasma cells with no clonality reported.  Cytogenetics were also reported as  normal.  No intervention is needed at this time.  3.  Iron deficiency anemia: Chronic and unchanged.  Patient's most recent hemoglobin is 10.3.  He last received IV Feraheme on October 06, 2019.   4.  Renal insufficiency: Chronic and unchanged.  Patient's creatinine is 1.46. 5.  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  Improved.  Unclear if patient continues to take gabapentin.  Monitor. 6.  Leukopenia: Mild, monitor. 7. Thrombocytopenia: Improved.  Patient's most recent platelet count is 131. 8.  Hypokalemia: Chronic and unchanged.  Continue dietary changes.    Patient expressed understanding and was in agreement with this plan. He also understands that He can call clinic at any time with any questions, concerns, or complaints.    Chris Huger, MD   10/02/2021 9:56 AM

## 2021-10-03 ENCOUNTER — Inpatient Hospital Stay: Payer: Medicare HMO | Admitting: Oncology

## 2021-10-04 ENCOUNTER — Ambulatory Visit: Payer: Medicare HMO

## 2021-10-06 NOTE — Progress Notes (Deleted)
[place]  Telephone:(336) [phone] Fax:(336) [phone]  ID: DAVIEL ALLEGRETTO OB: [birth year]  MR#: [id number]  GHW#:[id number]  Patient Care Team: Sofie Hartigan, MD as PCP - General (Family Medicine) Lloyd Huger, MD as Consulting Physician (Oncology) Anabel Bene, MD as Referring Physician (Neurology)  CHIEF COMPLAINT: Recurrent colon cancer, smoldering myeloma.  INTERVAL HISTORY: Patient returns to clinic today to discuss his imaging and laboratory results.  He continues to have a peripheral neuropathy, but otherwise feels well.  He has no other neurologic complaints. He has no chest pain, shortness of breath, cough, or hemoptysis.  He denies any nausea, vomiting, constipation, or diarrhea.  He has no melena or hematochezia.  He has no urinary complaints.  Patient offers no further specific complaints today.  REVIEW OF SYSTEMS:   Review of Systems  Constitutional: Negative.  Negative for fever, malaise/fatigue and weight loss.  Respiratory: Negative.  Negative for cough, hemoptysis and shortness of breath.   Cardiovascular: Negative.  Negative for chest pain and leg swelling.  Gastrointestinal: Negative.  Negative for abdominal pain, blood in stool and melena.  Genitourinary: Negative.  Negative for hematuria.  Musculoskeletal: Negative.  Negative for back pain.  Skin: Negative.  Negative for rash.  Neurological:  Positive for tingling and sensory change. Negative for dizziness, focal weakness, weakness and headaches.  Psychiatric/Behavioral: Negative.  Negative for depression. The patient is not nervous/anxious.    As per HPI. Otherwise, a complete review of systems is negative.  PAST MEDICAL HISTORY: Past Medical History:  Diagnosis Date   A-fib ([place])    Anemia    Arthritis    ankles   Cancer (HCC)    multiple myeloma   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[place]) 10/26/2019   Cardiomyopathy ([place])    CHF (congestive heart failure) (HCC)    Depression     Dysrhythmia    atrial fib   HLD (hyperlipidemia)    Hypercholesteremia    Hypertension    Moderate tricuspid insufficiency    Multiple myeloma (HCC)    not being treated right now per pt 11/11/19   Pneumonia    Sleep apnea    No CPAP    PAST SURGICAL HISTORY: Past Surgical History:  Procedure Laterality Date   ATRIAL ABLATION SURGERY     CARDIAC SURGERY     A-Fib Ablations   COLONOSCOPY WITH PROPOFOL N/A 10/26/2019   Procedure: COLONOSCOPY WITH BIOPSY;  Surgeon: Lucilla Lame, MD;  Location: [facility];  Service: Endoscopy;  Laterality: N/A;   ESOPHAGOGASTRODUODENOSCOPY (EGD) WITH PROPOFOL N/A 10/26/2019   Procedure: ESOPHAGOGASTRODUODENOSCOPY (EGD) WITH BIOPSY;  Surgeon: Lucilla Lame, MD;  Location: [facility];  Service: Endoscopy;  Laterality: N/A;  sleep apnea   LAPAROSCOPIC PARTIAL COLECTOMY Right 11/17/2019   Procedure: LAPAROSCOPIC PARTIAL COLECTOMY RIGHT EXTENDED;  Surgeon: Olean Ree, MD;  Location: [facility];  Service: General;  Laterality: Right;   PORTACATH PLACEMENT N/A 12/28/2019   Procedure: INSERTION PORT-A-CATH Left subclavian;  Surgeon: Olean Ree, MD;  Location: [facility];  Service: General;  Laterality: N/A;    FAMILY HISTORY: Family History  Problem Relation Age of Onset   Healthy Mother     ADVANCED DIRECTIVES (Y/N):  N  HEALTH MAINTENANCE: Social History   Tobacco Use   Smoking status: Never   Smokeless tobacco: Never  Vaping Use   Vaping Use: Never used  Substance Use Topics   Alcohol use: Not Currently   Drug use: Never     Colonoscopy:  PAP:  Bone density:  Lipid panel:  No Known Allergies  Current Outpatient Medications  Medication Sig Dispense Refill   APPLE CIDER VINEGAR PO Take 30-45 mLs by mouth every other day.      aspirin 325 MG EC tablet Take 325 mg by mouth every other day. In the morning     azithromycin (ZITHROMAX) 250 MG tablet 1 pack, follow package directions 6 each 0   digoxin (LANOXIN) 0.25 MG  tablet Take 0.25 mg by mouth daily.      furosemide (LASIX) 80 MG tablet Take 80 mg by mouth 2 (two) times daily. Morning & late afternoon     gabapentin (NEURONTIN) 300 MG capsule Take by mouth.     Misc Natural Products (TART CHERRY ADVANCED PO) Take 60 mLs by mouth every other day.     potassium bicarbonate (K-LYTE) 25 MEQ disintegrating tablet Take 25 mEq by mouth 4 (four) times a week. In the afternoon.     spironolactone (ALDACTONE) 25 MG tablet Take by mouth.     No current facility-administered medications for this visit.    OBJECTIVE: There were no vitals filed for this visit.     There is no height or weight on file to calculate BMI.    ECOG FS:0 - Asymptomatic  General: Well-developed, well-nourished, no acute distress. Eyes: Pink conjunctiva, anicteric sclera. HEENT: Normocephalic, moist mucous membranes. Lungs: No audible wheezing or coughing. Heart: Regular rate and rhythm. Abdomen: Soft, nontender, no obvious distention. Musculoskeletal: No edema, cyanosis, or clubbing. Neuro: Alert, answering all questions appropriately. Cranial nerves grossly intact. Skin: No rashes or petechiae noted. Psych: Normal affect.  LAB RESULTS:  Lab Results  Component Value Date   NA 137 06/15/2021   K 3.1 (L) 06/15/2021   CL 101 06/15/2021   CO2 28 06/15/2021   GLUCOSE 108 (H) 06/15/2021   BUN 22 06/15/2021   CREATININE 1.46 (H) 06/15/2021   CALCIUM 8.3 (L) 06/15/2021   PROT 6.9 06/15/2021   ALBUMIN 3.1 (L) 06/15/2021   AST 34 06/15/2021   ALT 20 06/15/2021   ALKPHOS 117 06/15/2021   BILITOT 0.9 06/15/2021   GFRNONAA 53 (L) 06/15/2021   GFRAA >60 07/29/2020    Lab Results  Component Value Date   WBC 3.5 (L) 06/15/2021   NEUTROABS 2.4 06/15/2021   HGB 10.3 (L) 06/15/2021   HCT 33.0 (L) 06/15/2021   MCV 90.9 06/15/2021   PLT 131 (L) 06/15/2021   Lab Results  Component Value Date   IRON 41 (L) 05/11/2020   TIBC 323 05/11/2020   IRONPCTSAT 13 (L) 05/11/2020   Lab  Results  Component Value Date   FERRITIN 86 05/11/2020     STUDIES: No results found.   ASSESSMENT: Stage IIIc colon cancer, smoldering myeloma.  PLAN:    1.  Stage IIIc colon cancer: Patient completed cycle 9 of adjuvant FOLFOX on 06/03/2020.  Given his difficulties with treatment and declining performance status, treatment was discontinued altogether.  Patient's last colonoscopy was on October 26, 2019.  He has not had a repeat procedure since completion of treatment.  CT scan results from June 08, 2021 reviewed independently with a mild increase in retroperitoneal lymphadenopathy.  PET scan from June 24, 2021 reviewed independently and reported as above with an enlarged retroperitoneal lymph node measuring 2.3 cm that is hypermetabolic.  No additional sites of hypermetabolism were noted.  Patient CEA has also increased to greater than 200.  This is clearly a recurrence, but appears to be minimal.  Will discuss patient at tumor board later this week to discuss optimal treatment options.  Patient will follow-up based on tumor board conversation.    2. Smoldering myeloma: Patient was initially diagnosed and treated in [place], [place] and treated with single agent Velcade in approximately 2013.  He was evaluated in clinic in July 2014 when he declined any maintenance treatment or referral for bone marrow transplant.  He was subsequently lost to follow-up.  His most recent SPEP revealed an M spike of 1.3 and an IgG component of 1822 which are unchanged from previous.  He also has a suppressed IgA level.  Kappa free light chains are increased to 48.1 which is essentially unchanged.  He has a mild renal insufficiency and anemia, but these are unchanged as well.  Metastatic bone survey on May 20, 2019 reviewed independently with no obvious bony lesions.  Bone marrow biopsy completed on May 28, 2019 revealed only 10 to 15% plasma cells with no clonality reported.  Cytogenetics were also reported as  normal.  No intervention is needed at this time.  3.  Iron deficiency anemia: Chronic and unchanged.  Patient's most recent hemoglobin is 10.3.  He last received IV Feraheme on October 06, 2019.   4.  Renal insufficiency: Chronic and unchanged.  Patient's creatinine is 1.46. 5.  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  Improved.  Unclear if patient continues to take gabapentin.  Monitor. 6.  Leukopenia: Mild, monitor. 7. Thrombocytopenia: Improved.  Patient's most recent platelet count is 131. 8.  Hypokalemia: Chronic and unchanged.  Continue dietary changes.    Patient expressed understanding and was in agreement with this plan. He also understands that He can call clinic at any time with any questions, concerns, or complaints.    Lloyd Huger, MD   10/06/2021 3:16 PM

## 2021-10-09 ENCOUNTER — Ambulatory Visit: Payer: Medicare HMO

## 2021-10-09 ENCOUNTER — Telehealth: Payer: Self-pay | Admitting: Oncology

## 2021-10-09 NOTE — Telephone Encounter (Signed)
Pt called to reschedule his appt on 12-7. He has not had his MRI. Wants something after the 14th. Call back number is [phone]

## 2021-10-10 ENCOUNTER — Ambulatory Visit: Payer: Medicare HMO | Admitting: Urology

## 2021-10-11 ENCOUNTER — Inpatient Hospital Stay: Payer: Medicare HMO | Admitting: Oncology

## 2021-10-11 DIAGNOSIS — C184 Malignant neoplasm of transverse colon: Secondary | ICD-10-CM

## 2021-10-18 ENCOUNTER — Ambulatory Visit: Admission: RE | Admit: 2021-10-18 | Payer: Medicare HMO | Source: Ambulatory Visit

## 2021-10-24 ENCOUNTER — Ambulatory Visit: Payer: Medicare HMO | Admitting: Urology

## 2021-10-30 NOTE — Progress Notes (Deleted)
[place]  Telephone:(336) [phone] Fax:(336) [phone]  ID: RAFAN SANDERS OB: 1956/01/31  MR#: [id number]  AOZ#:[id number]  Patient Care Team: Sofie Hartigan, MD as PCP - General (Family Medicine) Lloyd Huger, MD as Consulting Physician (Oncology) Anabel Bene, MD as Referring Physician (Neurology)  CHIEF COMPLAINT: Recurrent colon cancer, smoldering myeloma.  INTERVAL HISTORY: Patient returns to clinic today to discuss his imaging and laboratory results.  He continues to have a peripheral neuropathy, but otherwise feels well.  He has no other neurologic complaints. He has no chest pain, shortness of breath, cough, or hemoptysis.  He denies any nausea, vomiting, constipation, or diarrhea.  He has no melena or hematochezia.  He has no urinary complaints.  Patient offers no further specific complaints today.  REVIEW OF SYSTEMS:   Review of Systems  Constitutional: Negative.  Negative for fever, malaise/fatigue and weight loss.  Respiratory: Negative.  Negative for cough, hemoptysis and shortness of breath.   Cardiovascular: Negative.  Negative for chest pain and leg swelling.  Gastrointestinal: Negative.  Negative for abdominal pain, blood in stool and melena.  Genitourinary: Negative.  Negative for hematuria.  Musculoskeletal: Negative.  Negative for back pain.  Skin: Negative.  Negative for rash.  Neurological:  Positive for tingling and sensory change. Negative for dizziness, focal weakness, weakness and headaches.  Psychiatric/Behavioral: Negative.  Negative for depression. The patient is not nervous/anxious.    As per HPI. Otherwise, a complete review of systems is negative.  PAST MEDICAL HISTORY: Past Medical History:  Diagnosis Date   A-fib ([place])    Anemia    Arthritis    ankles   Cancer (HCC)    multiple myeloma   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[place]) 10/26/2019   Cardiomyopathy ([place])    CHF (congestive heart failure) (HCC)    Depression     Dysrhythmia    atrial fib   HLD (hyperlipidemia)    Hypercholesteremia    Hypertension    Moderate tricuspid insufficiency    Multiple myeloma (HCC)    not being treated right now per pt 11/11/19   Pneumonia    Sleep apnea    No CPAP    PAST SURGICAL HISTORY: Past Surgical History:  Procedure Laterality Date   ATRIAL ABLATION SURGERY     CARDIAC SURGERY     A-Fib Ablations   COLONOSCOPY WITH PROPOFOL N/A 10/26/2019   Procedure: COLONOSCOPY WITH BIOPSY;  Surgeon: Lucilla Lame, MD;  Location: [place];  Service: Endoscopy;  Laterality: N/A;   ESOPHAGOGASTRODUODENOSCOPY (EGD) WITH PROPOFOL N/A 10/26/2019   Procedure: ESOPHAGOGASTRODUODENOSCOPY (EGD) WITH BIOPSY;  Surgeon: Lucilla Lame, MD;  Location: [place];  Service: Endoscopy;  Laterality: N/A;  sleep apnea   LAPAROSCOPIC PARTIAL COLECTOMY Right 11/17/2019   Procedure: LAPAROSCOPIC PARTIAL COLECTOMY RIGHT EXTENDED;  Surgeon: Olean Ree, MD;  Location: [facility];  Service: General;  Laterality: Right;   PORTACATH PLACEMENT N/A 12/28/2019   Procedure: INSERTION PORT-A-CATH Left subclavian;  Surgeon: Olean Ree, MD;  Location: [facility];  Service: General;  Laterality: N/A;    FAMILY HISTORY: Family History  Problem Relation Age of Onset   Healthy Mother     ADVANCED DIRECTIVES (Y/N):  N  HEALTH MAINTENANCE: Social History   Tobacco Use   Smoking status: Never   Smokeless tobacco: Never  Vaping Use   Vaping Use: Never used  Substance Use Topics   Alcohol use: Not Currently   Drug use: Never     Colonoscopy:  PAP:  Bone density:  Lipid panel:  No Known Allergies  Current Outpatient Medications  Medication Sig Dispense Refill   APPLE CIDER VINEGAR PO Take 30-45 mLs by mouth every other day.      aspirin 325 MG EC tablet Take 325 mg by mouth every other day. In the morning     azithromycin (ZITHROMAX) 250 MG tablet 1 pack, follow package directions 6 each 0   digoxin (LANOXIN) 0.25 MG  tablet Take 0.25 mg by mouth daily.      furosemide (LASIX) 80 MG tablet Take 80 mg by mouth 2 (two) times daily. Morning & late afternoon     gabapentin (NEURONTIN) 300 MG capsule Take by mouth.     Misc Natural Products (TART CHERRY ADVANCED PO) Take 60 mLs by mouth every other day.     potassium bicarbonate (K-LYTE) 25 MEQ disintegrating tablet Take 25 mEq by mouth 4 (four) times a week. In the afternoon.     spironolactone (ALDACTONE) 25 MG tablet Take by mouth.     No current facility-administered medications for this visit.    OBJECTIVE: There were no vitals filed for this visit.     There is no height or weight on file to calculate BMI.    ECOG FS:0 - Asymptomatic  General: Well-developed, well-nourished, no acute distress. Eyes: Pink conjunctiva, anicteric sclera. HEENT: Normocephalic, moist mucous membranes. Lungs: No audible wheezing or coughing. Heart: Regular rate and rhythm. Abdomen: Soft, nontender, no obvious distention. Musculoskeletal: No edema, cyanosis, or clubbing. Neuro: Alert, answering all questions appropriately. Cranial nerves grossly intact. Skin: No rashes or petechiae noted. Psych: Normal affect.  LAB RESULTS:  Lab Results  Component Value Date   NA 137 06/15/2021   K 3.1 (L) 06/15/2021   CL 101 06/15/2021   CO2 28 06/15/2021   GLUCOSE 108 (H) 06/15/2021   BUN 22 06/15/2021   CREATININE 1.46 (H) 06/15/2021   CALCIUM 8.3 (L) 06/15/2021   PROT 6.9 06/15/2021   ALBUMIN 3.1 (L) 06/15/2021   AST 34 06/15/2021   ALT 20 06/15/2021   ALKPHOS 117 06/15/2021   BILITOT 0.9 06/15/2021   GFRNONAA 53 (L) 06/15/2021   GFRAA >60 07/29/2020    Lab Results  Component Value Date   WBC 3.5 (L) 06/15/2021   NEUTROABS 2.4 06/15/2021   HGB 10.3 (L) 06/15/2021   HCT 33.0 (L) 06/15/2021   MCV 90.9 06/15/2021   PLT 131 (L) 06/15/2021   Lab Results  Component Value Date   IRON 41 (L) 05/11/2020   TIBC 323 05/11/2020   IRONPCTSAT 13 (L) 05/11/2020   Lab  Results  Component Value Date   FERRITIN 86 05/11/2020     STUDIES: No results found.   ASSESSMENT: Stage IIIc colon cancer, smoldering myeloma.  PLAN:    1.  Stage IIIc colon cancer: Patient completed cycle 9 of adjuvant FOLFOX on 06/03/2020.  Given his difficulties with treatment and declining performance status, treatment was discontinued altogether.  Patient's last colonoscopy was on October 26, 2019.  He has not had a repeat procedure since completion of treatment.  CT scan results from June 08, 2021 reviewed independently with a mild increase in retroperitoneal lymphadenopathy.  PET scan from June 24, 2021 reviewed independently and reported as above with an enlarged retroperitoneal lymph node measuring 2.3 cm that is hypermetabolic.  No additional sites of hypermetabolism were noted.  Patient CEA has also increased to greater than 200.  This is clearly a recurrence, but appears to be minimal.  Will discuss patient at tumor board later this week to discuss optimal treatment options.  Patient will follow-up based on tumor board conversation.    2. 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  He was evaluated in clinic in July 2014 when he declined any maintenance treatment or referral for bone marrow transplant.  He was subsequently lost to follow-up.  His most recent SPEP revealed an M spike of 1.3 and an IgG component of 1822 which are unchanged from previous.  He also has a suppressed IgA level.  Kappa free light chains are increased to 48.1 which is essentially unchanged.  He has a mild renal insufficiency and anemia, but these are unchanged as well.  Metastatic bone survey on May 20, 2019 reviewed independently with no obvious bony lesions.  Bone marrow biopsy completed on May 28, 2019 revealed only 10 to 15% plasma cells with no clonality reported.  Cytogenetics were also reported as  normal.  No intervention is needed at this time.  3.  Iron deficiency anemia: Chronic and unchanged.  Patient's most recent hemoglobin is 10.3.  He last received IV Feraheme on October 06, 2019.   4.  Renal insufficiency: Chronic and unchanged.  Patient's creatinine is 1.46. 5.  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  Improved.  Unclear if patient continues to take gabapentin.  Monitor. 6.  Leukopenia: Mild, monitor. 7. Thrombocytopenia: Improved.  Patient's most recent platelet count is 131. 8.  Hypokalemia: Chronic and unchanged.  Continue dietary changes.    Patient expressed understanding and was in agreement with this plan. He also understands that He can call clinic at any time with any questions, concerns, or complaints.    Lloyd Huger, MD   10/30/2021 9:07 AM

## 2021-11-01 ENCOUNTER — Inpatient Hospital Stay: Payer: Medicare HMO | Attending: Oncology | Admitting: Oncology

## 2021-11-30 ENCOUNTER — Ambulatory Visit: Payer: Medicare HMO | Admitting: Radiation Oncology

## 2022-01-30 ENCOUNTER — Telehealth: Payer: Self-pay

## 2022-01-30 ENCOUNTER — Other Ambulatory Visit: Payer: Self-pay

## 2022-01-30 ENCOUNTER — Telehealth: Payer: Self-pay | Admitting: *Deleted

## 2022-01-30 DIAGNOSIS — C184 Malignant neoplasm of transverse colon: Secondary | ICD-10-CM

## 2022-01-30 NOTE — Telephone Encounter (Signed)
Patient called reporting that he is having problems with his neuropathy, does not know if he needs a port flush, and that he has generalized questions that he doesn't think is related to his cancer. He cancelled his November appointment, was a No Show for his appointment 11/01/21 and has no follow up scheduled at this time. He also cancelled his appointment with Dr Baruch Gouty in January. He is requesting an appointment to be seen ASAP. Please advise ?

## 2022-01-30 NOTE — Telephone Encounter (Signed)
Returned patient's call per patient, neuropathy has gotten worse along with gout flare. No swelling but difficulty walking and some SOB w/ exertion.Josh advised  advised office visit with labs. Patient  scheduled, agrees and verbalizes understanding.  ?

## 2022-01-31 ENCOUNTER — Other Ambulatory Visit: Payer: Self-pay

## 2022-01-31 ENCOUNTER — Inpatient Hospital Stay: Payer: Medicare HMO

## 2022-01-31 ENCOUNTER — Inpatient Hospital Stay: Payer: Medicare HMO | Attending: Hospice and Palliative Medicine | Admitting: Hospice and Palliative Medicine

## 2022-01-31 VITALS — BP 114/61 | HR 53 | Temp 97.6°F | Resp 16 | Wt 228.0 lb

## 2022-01-31 DIAGNOSIS — D472 Monoclonal gammopathy: Secondary | ICD-10-CM | POA: Insufficient documentation

## 2022-01-31 DIAGNOSIS — C184 Malignant neoplasm of transverse colon: Secondary | ICD-10-CM

## 2022-01-31 DIAGNOSIS — N189 Chronic kidney disease, unspecified: Secondary | ICD-10-CM | POA: Insufficient documentation

## 2022-01-31 DIAGNOSIS — Z95828 Presence of other vascular implants and grafts: Secondary | ICD-10-CM

## 2022-01-31 DIAGNOSIS — R972 Elevated prostate specific antigen [PSA]: Secondary | ICD-10-CM | POA: Diagnosis not present

## 2022-01-31 LAB — MAGNESIUM: Magnesium: 2 mg/dL (ref 1.7–2.4)

## 2022-01-31 LAB — COMPREHENSIVE METABOLIC PANEL
ALT: 28 U/L (ref 0–44)
AST: 37 U/L (ref 15–41)
Albumin: 3.9 g/dL (ref 3.5–5.0)
Alkaline Phosphatase: 160 U/L — ABNORMAL HIGH (ref 38–126)
Anion gap: 7 (ref 5–15)
BUN: 34 mg/dL — ABNORMAL HIGH (ref 8–23)
CO2: 29 mmol/L (ref 22–32)
Calcium: 8.9 mg/dL (ref 8.9–10.3)
Chloride: 97 mmol/L — ABNORMAL LOW (ref 98–111)
Creatinine, Ser: 2.16 mg/dL — ABNORMAL HIGH (ref 0.61–1.24)
GFR, Estimated: 33 mL/min — ABNORMAL LOW (ref >60)
Glucose, Bld: 233 mg/dL — ABNORMAL HIGH (ref 70–99)
Potassium: 5.4 mmol/L — ABNORMAL HIGH (ref 3.5–5.1)
Sodium: 133 mmol/L — ABNORMAL LOW (ref 135–145)
Total Bilirubin: 0.8 mg/dL (ref 0.3–1.2)
Total Protein: 8.9 g/dL — ABNORMAL HIGH (ref 6.5–8.1)

## 2022-01-31 LAB — CBC
HCT: 31.5 % — ABNORMAL LOW (ref 39.0–52.0)
Hemoglobin: 9.9 g/dL — ABNORMAL LOW (ref 13.0–17.0)
MCH: 28.3 pg (ref 26.0–34.0)
MCHC: 31.4 g/dL (ref 30.0–36.0)
MCV: 90 fL (ref 80.0–100.0)
Platelets: 108 10*3/uL — ABNORMAL LOW (ref 150–400)
RBC: 3.5 MIL/uL — ABNORMAL LOW (ref 4.22–5.81)
RDW: 15.2 % (ref 11.5–15.5)
WBC: 3.1 10*3/uL — ABNORMAL LOW (ref 4.0–10.5)
nRBC: 0 % (ref 0.0–0.2)

## 2022-01-31 LAB — PSA: Prostatic Specific Antigen: 19.63 ng/mL — ABNORMAL HIGH (ref 0.00–4.00)

## 2022-01-31 LAB — URIC ACID: Uric Acid, Serum: 12.6 mg/dL — ABNORMAL HIGH (ref 3.7–8.6)

## 2022-01-31 MED ORDER — SODIUM CHLORIDE 0.9% FLUSH
10.0000 mL | Freq: Once | INTRAVENOUS | Status: AC
  Administered 2022-01-31: 10 mL via INTRAVENOUS
  Filled 2022-01-31: qty 10

## 2022-01-31 MED ORDER — SODIUM CHLORIDE 0.9 % IV SOLN
INTRAVENOUS | Status: AC
  Filled 2022-01-31 (×2): qty 250

## 2022-01-31 MED ORDER — HEPARIN SOD (PORK) LOCK FLUSH 100 UNIT/ML IV SOLN
500.0000 [IU] | Freq: Once | INTRAVENOUS | Status: AC
  Administered 2022-01-31: 500 [IU] via INTRAVENOUS
  Filled 2022-01-31: qty 5

## 2022-01-31 NOTE — Progress Notes (Signed)
Pt has multiple concerns today. His main concerns are his worsening neuropathy, questions about psa level, and generalized aches and pains.  ?

## 2022-01-31 NOTE — Progress Notes (Signed)
? ?[facility] ?[place] [facility] ?Telephone:(336) [phone] Fax:(336) [phone] ? ?Patient Care Team: ?Sofie Hartigan, MD as PCP - General (Family Medicine) ?Lloyd Huger, MD as Consulting Physician (Oncology) ?Anabel Bene, MD as Referring Physician (Neurology)  ? ?Name of the patient: Chris George  ?[id number]  ?[birth year]  ? ?Date of visit: 01/31/22 ? ?Reason for Consult: ?Chris George is a 66 y.o. male with multiple medical problems including recurrent colon cancer and smoldering myeloma.  Patient completed 9 cycles of adjuvant FOLFOX on 06/03/2020.  Treatment was discontinued due to declining performance status and poor tolerance.  Patient also has history of smoldering myeloma previously treated with single agent Velcade in 2013.  Patient declined bone marrow transplant. ? ?Patient last saw Dr. Grayland Ormond on 06/27/2021.  Patient chronically complained of peripheral neuropathy but otherwise was at his baseline.  CT from June 08, 2021 revealed mild increase in retroperitoneal lymphadenopathy.  PET scan on June 24, 2021 revealed hypermetabolic enlargement retroperitoneal lymph node.  Patient CEA has also been increasing.  Patient was felt clinically to have recurred.  Plan was to present patient at tumor conference and follow-up on treatment recommendations. ? ?He received SBRT, which was completed in October 2022. Unfortunately, patient was subsequently lost to follow-up in our office. ? ?Patient was seen by urology in November 2022 for rising PSA with plan to pursue possible prostate MRI. ? ?Today, patient presents to clinic with multiple symptomatic complaints including poor fluid intake, chronic muscle cramping, and persistent/chronic peripheral neuropathy.  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  He wants to check on the status of the cancer.  Additionally, patient states that he was recently  treated for gout flare by PCP with steroids/colchicine.  He has had residual pain in his ankles/feet after gout treatment. ? ?Denies recent fevers or illnesses. Denies any easy bleeding or bruising. Reports good appetite and denies weight loss. Denies chest pain. Denies any nausea, vomiting, constipation, or diarrhea. Denies urinary complaints. Patient offers no further specific complaints today. ? ?PAST MEDICAL HISTORY: ?Past Medical History:  ?Diagnosis Date  ? A-fib ([place])   ? Anemia   ? Arthritis   ? ankles  ? Cancer [facility])   ? multiple myeloma  ? 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[place]) 10/26/2019  ? Cardiomyopathy ([place])   ? CHF (congestive heart failure) ([place])   ? Depression   ? Dysrhythmia   ? atrial fib  ? HLD (hyperlipidemia)   ? Hypercholesteremia   ? Hypertension   ? Moderate tricuspid insufficiency   ? Multiple myeloma ([place])   ? not being treated right now per pt 11/11/19  ? Pneumonia   ? Sleep apnea   ? No CPAP  ? ? ?PAST SURGICAL HISTORY:  ?Past Surgical History:  ?Procedure Laterality Date  ? ATRIAL ABLATION SURGERY    ? CARDIAC SURGERY    ? A-Fib Ablations  ? COLONOSCOPY WITH PROPOFOL N/A 10/26/2019  ? Procedure: COLONOSCOPY WITH BIOPSY;  Surgeon: Lucilla Lame, MD;  Location: [place];  Service: Endoscopy;  Laterality: N/A;  ? ESOPHAGOGASTRODUODENOSCOPY (EGD) WITH PROPOFOL N/A 10/26/2019  ? Procedure: ESOPHAGOGASTRODUODENOSCOPY (EGD) WITH BIOPSY;  Surgeon: Lucilla Lame, MD;  Location: [place];  Service: Endoscopy;  Laterality: N/A;  sleep apnea  ? LAPAROSCOPIC PARTIAL COLECTOMY Right 11/17/2019  ? Procedure: LAPAROSCOPIC PARTIAL COLECTOMY RIGHT EXTENDED;  Surgeon: Olean Ree, MD;  Location: [facility];  Service: General;  Laterality: Right;  ? PORTACATH PLACEMENT N/A 12/28/2019  ? Procedure: INSERTION PORT-A-CATH Left subclavian;  Surgeon: Olean Ree, MD;  Location: [facility];  Service: General;  Laterality: N/A;  ? ? ?HEMATOLOGY/ONCOLOGY HISTORY:  ?Oncology History  ?Cancer of transverse  colon ([place])  ?10/27/2019 Initial Diagnosis  ? Cancer of transverse colon [facility]) ?  ?12/01/2019 Cancer Staging  ? Staging form: Colon and Rectum, AJCC 8th Edition ?- Clinical stage from 12/01/2019: Stage IIIC (cT3, cN2b, cM0) - Signed by Lloyd Huger, MD on 12/01/2019 ? ?  ?12/30/2019 -  Chemotherapy  ? The patient had palonosetron (ALOXI) injection 0.25 mg, 0.25 mg, Intravenous,  Once, 9 of 12 cycles ?Administration: 0.25 mg (12/30/2019), 0.25 mg (01/13/2020), 0.25 mg (01/27/2020), 0.25 mg (02/17/2020), 0.25 mg (03/02/2020), 0.25 mg (03/23/2020), 0.25 mg (04/13/2020), 0.25 mg (05/11/2020), 0.25 mg (06/01/2020) ?pegfilgrastim-cbqv (UDENYCA) injection 6 mg, 6 mg, Subcutaneous, Once, 6 of 9 cycles ?Administration: 6 mg (02/19/2020), 6 mg (03/04/2020), 6 mg (03/25/2020), 6 mg (04/15/2020), 6 mg (05/13/2020), 6 mg (06/03/2020) ?leucovorin 850 mg in dextrose 5 % 250 mL infusion, 860 mg, Intravenous,  Once, 9 of 12 cycles ?Administration: 850 mg (12/30/2019), 850 mg (01/27/2020), 850 mg (02/17/2020), 850 mg (03/02/2020), 850 mg (03/23/2020), 850 mg (04/13/2020), 850 mg (05/11/2020), 850 mg (06/01/2020) ?oxaliplatin (ELOXATIN) 185 mg in dextrose 5 % 500 mL chemo infusion, 85 mg/m2 = 185 mg, Intravenous,  Once, 9 of 12 cycles ?Administration: 185 mg (12/30/2019), 185 mg (01/13/2020), 185 mg (01/27/2020), 185 mg (02/17/2020), 185 mg (03/02/2020), 185 mg (03/23/2020), 185 mg (04/13/2020), 185 mg (05/11/2020), 185 mg (06/01/2020) ?fluorouracil (ADRUCIL) chemo injection 850 mg, 400 mg/m2 = 850 mg, Intravenous,  Once, 9 of 12 cycles ?Administration: 850 mg (12/30/2019), 850 mg (01/13/2020), 850 mg (01/27/2020), 850 mg (02/17/2020), 850 mg (03/02/2020), 850 mg (03/23/2020), 850 mg (04/13/2020), 850 mg (05/11/2020), 850 mg (06/01/2020) ?fluorouracil (ADRUCIL) 5,000 mg in sodium chloride 0.9 % 150 mL chemo infusion, 5,150 mg, Intravenous, 1 Day/Dose, 9 of 12 cycles ?Administration: 5,000 mg (12/30/2019), 5,150 mg (01/13/2020), 5,150 mg (01/27/2020), 5,150 mg (02/17/2020), 5,150 mg  (03/02/2020), 5,150 mg (03/23/2020), 5,150 mg (04/13/2020), 5,150 mg (05/11/2020), 5,150 mg (06/01/2020) ? ? for chemotherapy treatment.  ? ?  ? ? ?ALLERGIES:  has No Known Allergies. ? ?MEDICATIONS:  ?Current Outpatient Medications  ?Medication Sig Dispense Refill  ? APPLE CIDER VINEGAR PO Take 30-45 mLs by mouth every other day.     ? aspirin 325 MG EC tablet Take 325 mg by mouth every other day. In the morning    ? azithromycin (ZITHROMAX) 250 MG tablet 1 pack, follow package directions 6 each 0  ? digoxin (LANOXIN) 0.25 MG tablet Take 0.25 mg by mouth daily.     ? furosemide (LASIX) 80 MG tablet Take 80 mg by mouth 2 (two) times daily. Morning & late afternoon    ? gabapentin (NEURONTIN) 300 MG capsule Take by mouth.    ? Misc Natural Products (TART CHERRY ADVANCED PO) Take 60 mLs by mouth every other day.    ? potassium bicarbonate (K-LYTE) 25 MEQ disintegrating tablet Take 25 mEq by mouth 4 (four) times a week. In the afternoon.    ? spironolactone (ALDACTONE) 25 MG tablet Take by mouth.    ? ?No current facility-administered medications for this visit.  ? ? ?VITAL SIGNS: ?There were no vitals taken for this visit. ?There were no vitals filed for this visit.  ?Estimated body mass index is 35.21 kg/m? as calculated from  the following: ?  Height as of 09/05/21: _0  (1.753 m). ?  Weight as of 09/25/21: 238 lb 6.4 oz (108.1 kg). ? ?LABS: ?CBC: ?   ?Component Value Date/Time  ? WBC 3.5 (L) 06/15/2021 7998  ? HGB 10.3 (L) 06/15/2021 0939  ? HGB 14.3 05/13/2013 1116  ? HCT 33.0 (L) 06/15/2021 0939  ? HCT 42.9 05/13/2013 1116  ? PLT 131 (L) 06/15/2021 0939  ? PLT 169 05/13/2013 1116  ? MCV 90.9 06/15/2021 0939  ? MCV 84 05/13/2013 1116  ? NEUTROABS 2.4 06/15/2021 0939  ? NEUTROABS 2.3 05/13/2013 1116  ? LYMPHSABS 0.5 (L) 06/15/2021 0012  ? LYMPHSABS 0.9 (L) 05/13/2013 1116  ? MONOABS 0.4 06/15/2021 0939  ? MONOABS 0.4 05/13/2013 1116  ? EOSABS 0.1 06/15/2021 0939  ? EOSABS 0.1 05/13/2013 1116  ? BASOSABS 0.0 06/15/2021 0939   ? BASOSABS 0.0 05/13/2013 1116  ? ?Comprehensive Metabolic Panel: ?   ?Component Value Date/Time  ? NA 137 06/15/2021 0939  ? NA 143 05/13/2013 1116  ? K 3.1 (L) 06/15/2021 0939  ? K 3.5 05/13/2013 1116

## 2022-02-01 LAB — CEA: CEA: 5.7 ng/mL — ABNORMAL HIGH (ref 0.0–4.7)

## 2022-02-04 ENCOUNTER — Other Ambulatory Visit: Payer: Self-pay | Admitting: *Deleted

## 2022-02-04 DIAGNOSIS — C184 Malignant neoplasm of transverse colon: Secondary | ICD-10-CM

## 2022-02-04 DIAGNOSIS — C9000 Multiple myeloma not having achieved remission: Secondary | ICD-10-CM

## 2022-02-05 ENCOUNTER — Telehealth: Payer: Self-pay | Admitting: *Deleted

## 2022-02-05 LAB — MULTIPLE MYELOMA PANEL, SERUM
Albumin SerPl Elph-Mcnc: 3.6 g/dL (ref 2.9–4.4)
Albumin/Glob SerPl: 0.8 (ref 0.7–1.7)
Alpha 1: 0.3 g/dL (ref 0.0–0.4)
Alpha2 Glob SerPl Elph-Mcnc: 0.7 g/dL (ref 0.4–1.0)
B-Globulin SerPl Elph-Mcnc: 1.2 g/dL (ref 0.7–1.3)
Gamma Glob SerPl Elph-Mcnc: 2.5 g/dL — ABNORMAL HIGH (ref 0.4–1.8)
Globulin, Total: 4.7 g/dL — ABNORMAL HIGH (ref 2.2–3.9)
IgA: 92 mg/dL (ref 61–437)
IgG (Immunoglobin G), Serum: 3091 mg/dL — ABNORMAL HIGH (ref 603–1613)
IgM (Immunoglobulin M), Srm: 95 mg/dL (ref 20–172)
M Protein SerPl Elph-Mcnc: 1.9 g/dL — ABNORMAL HIGH
Total Protein ELP: 8.3 g/dL (ref 6.0–8.5)

## 2022-02-05 NOTE — Telephone Encounter (Signed)
Called pt and let him know that dr Grayland Ormond says he thinks that pt will be approved  for the care  program. I told him that I put order in and the team there will look at that then usually call pt. I told him I will be checking on this in next 2 weeks ?

## 2022-02-07 ENCOUNTER — Inpatient Hospital Stay: Payer: Medicare HMO | Attending: Oncology

## 2022-02-07 DIAGNOSIS — N289 Disorder of kidney and ureter, unspecified: Secondary | ICD-10-CM | POA: Diagnosis not present

## 2022-02-07 DIAGNOSIS — D696 Thrombocytopenia, unspecified: Secondary | ICD-10-CM | POA: Diagnosis not present

## 2022-02-07 DIAGNOSIS — C182 Malignant neoplasm of ascending colon: Secondary | ICD-10-CM | POA: Diagnosis not present

## 2022-02-07 DIAGNOSIS — Z9049 Acquired absence of other specified parts of digestive tract: Secondary | ICD-10-CM | POA: Diagnosis not present

## 2022-02-07 DIAGNOSIS — T451X5A Adverse effect of antineoplastic and immunosuppressive drugs, initial encounter: Secondary | ICD-10-CM | POA: Diagnosis not present

## 2022-02-07 DIAGNOSIS — D472 Monoclonal gammopathy: Secondary | ICD-10-CM | POA: Insufficient documentation

## 2022-02-07 DIAGNOSIS — D72819 Decreased white blood cell count, unspecified: Secondary | ICD-10-CM | POA: Insufficient documentation

## 2022-02-07 DIAGNOSIS — R5383 Other fatigue: Secondary | ICD-10-CM | POA: Diagnosis not present

## 2022-02-07 DIAGNOSIS — D649 Anemia, unspecified: Secondary | ICD-10-CM | POA: Insufficient documentation

## 2022-02-07 DIAGNOSIS — G620 Drug-induced polyneuropathy: Secondary | ICD-10-CM | POA: Diagnosis not present

## 2022-02-07 DIAGNOSIS — R970 Elevated carcinoembryonic antigen [CEA]: Secondary | ICD-10-CM | POA: Diagnosis not present

## 2022-02-07 DIAGNOSIS — C184 Malignant neoplasm of transverse colon: Secondary | ICD-10-CM | POA: Insufficient documentation

## 2022-02-07 DIAGNOSIS — R531 Weakness: Secondary | ICD-10-CM | POA: Insufficient documentation

## 2022-02-07 DIAGNOSIS — R972 Elevated prostate specific antigen [PSA]: Secondary | ICD-10-CM | POA: Diagnosis not present

## 2022-02-07 LAB — BASIC METABOLIC PANEL
Anion gap: 8 (ref 5–15)
BUN: 33 mg/dL — ABNORMAL HIGH (ref 8–23)
CO2: 26 mmol/L (ref 22–32)
Calcium: 8.9 mg/dL (ref 8.9–10.3)
Chloride: 99 mmol/L (ref 98–111)
Creatinine, Ser: 2.14 mg/dL — ABNORMAL HIGH (ref 0.61–1.24)
GFR, Estimated: 33 mL/min — ABNORMAL LOW (ref >60)
Glucose, Bld: 141 mg/dL — ABNORMAL HIGH (ref 70–99)
Potassium: 3.9 mmol/L (ref 3.5–5.1)
Sodium: 133 mmol/L — ABNORMAL LOW (ref 135–145)

## 2022-02-09 ENCOUNTER — Ambulatory Visit
Admission: RE | Admit: 2022-02-09 | Discharge: 2022-02-09 | Disposition: A | Discharge status: Home / Self Care | Payer: Medicare HMO | Source: Ambulatory Visit | Attending: Hospice and Palliative Medicine | Admitting: Hospice and Palliative Medicine

## 2022-02-09 DIAGNOSIS — C184 Malignant neoplasm of transverse colon: Secondary | ICD-10-CM | POA: Diagnosis present

## 2022-02-09 MED ORDER — IOHEXOL 300 MG/ML  SOLN
75.0000 mL | Freq: Once | INTRAMUSCULAR | Status: AC | PRN
  Administered 2022-02-09: 75 mL via INTRAVENOUS

## 2022-02-12 NOTE — Progress Notes (Signed)
?[place]  ?[phone] [phone] ? ?ID: [name] OB: [birth year]  MR#: [id number]  QZR#:[id number] ? ?Patient Care Team: ?Sofie Hartigan, MD as PCP - General (Family Medicine) ?Lloyd Huger, MD as Consulting Physician (Oncology) ?Anabel Bene, MD as Referring Physician (Neurology) ? ?CHIEF COMPLAINT: Recurrent colon cancer, smoldering myeloma. ? ?INTERVAL HISTORY: Patient returns to clinic today for repeat laboratory work, further evaluation, discussion of his imaging results.  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  He has persistent weakness and fatigue as well. He has no other neurologic complaints. He has no chest pain, shortness of breath, cough, or hemoptysis.  He denies any nausea, vomiting, constipation, or diarrhea.  He has no melena or hematochezia.  He has no urinary complaints.  Patient offers no further specific complaints today. ? ?REVIEW OF SYSTEMS:   ?Review of Systems  ?Constitutional:  Positive for malaise/fatigue. Negative for fever and weight loss.  ?Respiratory: Negative.  Negative for cough, hemoptysis and shortness of breath.   ?Cardiovascular: Negative.  Negative for chest pain and leg swelling.  ?Gastrointestinal: Negative.  Negative for abdominal pain, blood in stool and melena.  ?Genitourinary: Negative.  Negative for hematuria.  ?Musculoskeletal: Negative.  Negative for back pain.  ?Skin: Negative.  Negative for rash.  ?Neurological:  Positive for tingling, sensory change and weakness. Negative for dizziness, focal weakness and headaches.  ?Psychiatric/Behavioral: Negative.  Negative for depression. The patient is not nervous/anxious.   ? ?As per HPI. Otherwise, a complete review of systems is negative. ? ?PAST MEDICAL HISTORY: ?Past Medical History:  ?Diagnosis Date  ? A-fib ([place])   ? Anemia   ? Arthritis   ? ankles  ? Cancer [facility])   ? multiple myeloma  ? 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 Cardiomyopathy ([place])   ? CHF (congestive heart failure) ([place])   ? Depression   ? Dysrhythmia   ? atrial fib  ? HLD (hyperlipidemia)   ? Hypercholesteremia   ? Hypertension   ? Moderate tricuspid insufficiency   ? Multiple myeloma ([place])   ? not being treated right now per pt 11/11/19  ? Pneumonia   ? Sleep apnea   ? No CPAP  ? ? ?PAST SURGICAL HISTORY: ?Past Surgical History:  ?Procedure Laterality Date  ? ATRIAL ABLATION SURGERY    ? CARDIAC SURGERY    ? A-Fib Ablations  ? COLONOSCOPY WITH PROPOFOL N/A 10/26/2019  ? Procedure: COLONOSCOPY WITH BIOPSY;  Surgeon: Lucilla Lame, MD;  Location: [place];  Service: Endoscopy;  Laterality: N/A;  ? ESOPHAGOGASTRODUODENOSCOPY (EGD) WITH PROPOFOL N/A 10/26/2019  ? Procedure: ESOPHAGOGASTRODUODENOSCOPY (EGD) WITH BIOPSY;  Surgeon: Lucilla Lame, MD;  Location: [place];  Service: Endoscopy;  Laterality: N/A;  sleep apnea  ? LAPAROSCOPIC PARTIAL COLECTOMY Right 11/17/2019  ? Procedure: LAPAROSCOPIC PARTIAL COLECTOMY RIGHT EXTENDED;  Surgeon: Olean Ree, MD;  Location: [facility];  Service: General;  Laterality: Right;  ? PORTACATH PLACEMENT N/A 12/28/2019  ? Procedure: INSERTION PORT-A-CATH Left subclavian;  Surgeon: Olean Ree, MD;  Location: [facility];  Service: General;  Laterality: N/A;  ? ? ?FAMILY HISTORY: ?Family History  ?Problem Relation Age of Onset  ? Healthy Mother   ? ? ?ADVANCED DIRECTIVES (Y/N):  N ? ?HEALTH MAINTENANCE: ?Social History  ? ?Tobacco Use  ? Smoking status: Never  ? Smokeless tobacco: Never  ?Vaping Use  ? Vaping Use: Never used  ?Substance Use Topics  ? Alcohol use:  Not Currently  ? Drug use: Never  ? ? ? Colonoscopy: ? PAP: ? Bone density: ? Lipid panel: ? ?No Known Allergies ? ?Current Outpatient Medications  ?Medication Sig Dispense Refill  ? APPLE CIDER VINEGAR PO Take 30-45 mLs by mouth every other day.     ? aspirin 325 MG EC tablet Take 325 mg by mouth every other day. In the morning    ? calcitRIOL (ROCALTROL) 0.25  MCG capsule Take by mouth.    ? colchicine 0.6 MG tablet Take 0.6 mg by mouth 2 (two) times daily.    ? digoxin (LANOXIN) 0.25 MG tablet Take 0.25 mg by mouth daily.     ? furosemide (LASIX) 80 MG tablet Take 80 mg by mouth daily. Morning & late afternoon    ? gabapentin (NEURONTIN) 300 MG capsule Take by mouth.    ? Misc Natural Products (TART CHERRY ADVANCED PO) Take 60 mLs by mouth every other day.    ? potassium bicarbonate (K-LYTE) 25 MEQ disintegrating tablet Take 25 mEq by mouth 4 (four) times a week. In the afternoon.    ? spironolactone (ALDACTONE) 25 MG tablet Take by mouth.    ? ?No current facility-administered medications for this visit.  ? ? ?OBJECTIVE: ?Vitals:  ? 02/13/22 1044  ?BP: 119/76  ?Pulse: 63  ?Resp: 16  ?Temp: 97.6 ?F (36.4 ?C)  ? ?   Body mass index is 34.11 kg/m?Marland Kitchen    ECOG FS:0 - Asymptomatic ? ?General: Well-developed, well-nourished, no acute distress. ?Eyes: Pink conjunctiva, anicteric sclera. ?HEENT: Normocephalic, moist mucous membranes. ?Lungs: No audible wheezing or coughing. ?Heart: Regular rate and rhythm. ?Abdomen: Soft, nontender, no obvious distention. ?Musculoskeletal: No edema, cyanosis, or clubbing. ?Neuro: Alert, answering all questions appropriately. Cranial nerves grossly intact. ?Skin: No rashes or petechiae noted. ?Psych: Normal affect. ? ?LAB RESULTS: ? ?Lab Results  ?Component Value Date  ? NA 133 (L) 02/07/2022  ? K 3.9 02/07/2022  ? CL 99 02/07/2022  ? CO2 26 02/07/2022  ? GLUCOSE 141 (H) 02/07/2022  ? BUN 33 (H) 02/07/2022  ? CREATININE 2.14 (H) 02/07/2022  ? CALCIUM 8.9 02/07/2022  ? PROT 8.9 (H) 01/31/2022  ? ALBUMIN 3.9 01/31/2022  ? AST 37 01/31/2022  ? ALT 28 01/31/2022  ? ALKPHOS 160 (H) 01/31/2022  ? BILITOT 0.8 01/31/2022  ? GFRNONAA 33 (L) 02/07/2022  ? GFRAA >60 07/29/2020  ? ? ?Lab Results  ?Component Value Date  ? WBC 3.1 (L) 01/31/2022  ? NEUTROABS 2.4 06/15/2021  ? HGB 9.9 (L) 01/31/2022  ? HCT 31.5 (L) 01/31/2022  ? MCV 90.0 01/31/2022  ? PLT 108 (L)  01/31/2022  ? ?Lab Results  ?Component Value Date  ? IRON 41 (L) 05/11/2020  ? TIBC 323 05/11/2020  ? IRONPCTSAT 13 (L) 05/11/2020  ? ?Lab Results  ?Component Value Date  ? FERRITIN 86 05/11/2020  ? ? ? ?STUDIES: ?CT CHEST ABDOMEN PELVIS W CONTRAST ? ?Result Date: 02/10/2022 ?CLINICAL DATA:  Metastatic colon cancer restaging, status post resection, chemotherapy and radiation treatment, cirrhosis * Tracking Code: BO * EXAM: CT CHEST, ABDOMEN, AND PELVIS WITH CONTRAST TECHNIQUE: Multidetector CT imaging of the chest, abdomen and pelvis was performed following the standard protocol during bolus administration of intravenous contrast. RADIATION DOSE REDUCTION: This exam was performed according to the departmental dose-optimization program which includes automated exposure control, adjustment of the mA and/or kV according to patient size and/or use of iterative reconstruction technique. CONTRAST:  33mL OMNIPAQUE IOHEXOL 300 MG/ML SOLN, additional oral  enteric COMPARISON:  PET-CT, 06/22/2021, CT abdomen pelvis, 06/07/2021 FINDINGS: CT CHEST FINDINGS Cardiovascular: Left chest port catheter. Cardiomegaly. Gross enlargement of the main pulmonary artery measuring up to 4.2 cm in caliber. No pericardial effusion. Mediastinum/Nodes: No enlarged mediastinal, hilar, or axillary lymph nodes. Thyroid gland, trachea, and esophagus demonstrate no significant findings. Lungs/Pleura: Occasional definitively benign tiny calcified pulmonary nodules in the right lower lobe (series 3, image 103). No pleural effusion or pneumothorax. Musculoskeletal: No chest wall mass or suspicious osseous lesions identified. CT ABDOMEN PELVIS FINDINGS Hepatobiliary: No solid liver abnormality is seen. Coarse, nodular contour of the liver. No gallstones, gallbladder wall thickening, or biliary dilatation. Pancreas: Unremarkable. No pancreatic ductal dilatation or surrounding inflammatory changes. Spleen: Normal in size without significant abnormality.  Adrenals/Urinary Tract: Adrenal glands are unremarkable. Kidneys are normal, without renal calculi, solid lesion, or hydronephrosis. Bladder is unremarkable. Stomach/Bowel: Stomach is within normal limits.

## 2022-02-13 ENCOUNTER — Inpatient Hospital Stay (HOSPITAL_BASED_OUTPATIENT_CLINIC_OR_DEPARTMENT_OTHER): Payer: Medicare HMO | Admitting: Oncology

## 2022-02-13 ENCOUNTER — Encounter: Payer: Self-pay | Admitting: Oncology

## 2022-02-13 VITALS — BP 119/76 | HR 63 | Temp 97.6°F | Resp 16 | Wt 231.0 lb

## 2022-02-13 DIAGNOSIS — C9000 Multiple myeloma not having achieved remission: Secondary | ICD-10-CM | POA: Diagnosis not present

## 2022-02-13 DIAGNOSIS — C184 Malignant neoplasm of transverse colon: Secondary | ICD-10-CM

## 2022-02-13 DIAGNOSIS — C182 Malignant neoplasm of ascending colon: Secondary | ICD-10-CM | POA: Diagnosis not present

## 2022-02-13 NOTE — Addendum Note (Signed)
Addended by: Wilford Corner on: 02/13/2022 12:33 PM ? ? Modules accepted: Orders ? ?

## 2022-02-13 NOTE — Progress Notes (Signed)
Patient here for results. He is having gout pain. ?

## 2022-02-16 ENCOUNTER — Inpatient Hospital Stay (HOSPITAL_BASED_OUTPATIENT_CLINIC_OR_DEPARTMENT_OTHER): Payer: Medicare HMO | Admitting: Internal Medicine

## 2022-02-16 DIAGNOSIS — T451X5A Adverse effect of antineoplastic and immunosuppressive drugs, initial encounter: Secondary | ICD-10-CM

## 2022-02-16 DIAGNOSIS — C182 Malignant neoplasm of ascending colon: Secondary | ICD-10-CM | POA: Diagnosis not present

## 2022-02-16 DIAGNOSIS — G620 Drug-induced polyneuropathy: Secondary | ICD-10-CM

## 2022-02-16 NOTE — Progress Notes (Signed)
Pt reports neuropathy in b/l hands and feet, worse in feet x 1 year.  ?

## 2022-02-16 NOTE — Progress Notes (Signed)
? ?[facility] [address]  ?[place], [place] [address] ?[phone] ? ? ?New Patient Evaluation ? ?Date of Service: 02/16/22 ?Patient Name: Chris George ?Patient MRN: [id number] ?Patient DOB: 01-Apr-1956 ?Provider: Ventura Sellers, MD ? ?Identifying Statement:  ?NIKESH TESCHNER is a 66 y.o. male with Chemotherapy-induced peripheral neuropathy ([place]) who presents for initial consultation and evaluation regarding cancer associated neurologic deficits.   ? ?Referring Provider: ?Sofie Hartigan, MD ?[address] ?[place],  [place] [id number] ? ?Primary Cancer: ? ?Oncologic History: ?Oncology History  ?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[place])  ?10/27/2019 Initial Diagnosis  ? Cancer of transverse colon [facility]) ?  ?12/01/2019 Cancer Staging  ? Staging form: Colon and Rectum, AJCC 8th Edition ?- Clinical stage from 12/01/2019: Stage IIIC (cT3, cN2b, cM0) - Signed by Lloyd Huger, MD on 12/01/2019 ? ?  ?12/30/2019 -  Chemotherapy  ? The patient had palonosetron (ALOXI) injection 0.25 mg, 0.25 mg, Intravenous,  Once, 9 of 12 cycles ?Administration: 0.25 mg (12/30/2019), 0.25 mg (01/13/2020), 0.25 mg (01/27/2020), 0.25 mg (02/17/2020), 0.25 mg (03/02/2020), 0.25 mg (03/23/2020), 0.25 mg (04/13/2020), 0.25 mg (05/11/2020), 0.25 mg (06/01/2020) ?pegfilgrastim-cbqv (UDENYCA) injection 6 mg, 6 mg, Subcutaneous, Once, 6 of 9 cycles ?Administration: 6 mg (02/19/2020), 6 mg (03/04/2020), 6 mg (03/25/2020), 6 mg (04/15/2020), 6 mg (05/13/2020), 6 mg (06/03/2020) ?leucovorin 850 mg in dextrose 5 % 250 mL infusion, 860 mg, Intravenous,  Once, 9 of 12 cycles ?Administration: 850 mg (12/30/2019), 850 mg (01/27/2020), 850 mg (02/17/2020), 850 mg (03/02/2020), 850 mg (03/23/2020), 850 mg (04/13/2020), 850 mg (05/11/2020), 850 mg (06/01/2020) ?oxaliplatin (ELOXATIN) 185 mg in dextrose 5 % 500 mL chemo infusion, 85 mg/m2 = 185 mg, Intravenous,  Once, 9 of 12 cycles ?Administration: 185 mg (12/30/2019), 185 mg (01/13/2020), 185 mg  (01/27/2020), 185 mg (02/17/2020), 185 mg (03/02/2020), 185 mg (03/23/2020), 185 mg (04/13/2020), 185 mg (05/11/2020), 185 mg (06/01/2020) ?fluorouracil (ADRUCIL) chemo injection 850 mg, 400 mg/m2 = 850 mg, Intravenous,  Once, 9 of 12 cycles ?Administration: 850 mg (12/30/2019), 850 mg (01/13/2020), 850 mg (01/27/2020), 850 mg (02/17/2020), 850 mg (03/02/2020), 850 mg (03/23/2020), 850 mg (04/13/2020), 850 mg (05/11/2020), 850 mg (06/01/2020) ?fluorouracil (ADRUCIL) 5,000 mg in sodium chloride 0.9 % 150 mL chemo infusion, 5,150 mg, Intravenous, 1 Day/Dose, 9 of 12 cycles ?Administration: 5,000 mg (12/30/2019), 5,150 mg (01/13/2020), 5,150 mg (01/27/2020), 5,150 mg (02/17/2020), 5,150 mg (03/02/2020), 5,150 mg (03/23/2020), 5,150 mg (04/13/2020), 5,150 mg (05/11/2020), 5,150 mg (06/01/2020) ? ? for chemotherapy treatment.  ? ?  ? ? ?History of Present Illness: ?The patient's records from the referring physician were obtained and reviewed and the patient interviewed to confirm this HPI.  Chris George presents today to review neuropathy symptoms.  He describes loss of sensation, numbness affecting his feet and hands since dosing chemotherapy for colon cancer in 2021.  Symptoms have not improved in over a year.  Gabapentin, dosed at $Remove'300mg'lbnxyfO$ , has not helped at all.  He denies frank pain, burning, pins and needles.  No issues with gait, but neuropathy does limit his ability to play tennis.  Following with Dr. Grayland Ormond for colon cancer surveillance. ? ?Medications: ?Current Outpatient Medications on File Prior to Visit  ?Medication Sig Dispense Refill  ? APPLE CIDER VINEGAR PO Take 30-45 mLs by mouth every other day.     ? aspirin 325 MG EC tablet Take 325 mg by mouth every other day. In the morning    ? calcitRIOL (ROCALTROL) 0.25  MCG capsule Take by mouth.    ? colchicine 0.6 MG tablet Take 0.6 mg by mouth 2 (two) times daily.    ? digoxin (LANOXIN) 0.25 MG tablet Take 0.25 mg by mouth daily.     ? furosemide (LASIX) 80 MG tablet Take 80 mg by mouth  daily. Morning & late afternoon    ? gabapentin (NEURONTIN) 300 MG capsule Take by mouth.    ? Misc Natural Products (TART CHERRY ADVANCED PO) Take 60 mLs by mouth every other day.    ? potassium bicarbonate (K-LYTE) 25 MEQ disintegrating tablet Take 25 mEq by mouth 4 (four) times a week. In the afternoon.    ? spironolactone (ALDACTONE) 25 MG tablet Take by mouth.    ? ?No current facility-administered medications on file prior to visit.  ? ? ?Allergies: No Known Allergies ?Past Medical History:  ?Past Medical History:  ?Diagnosis Date  ? A-fib ([place])   ? Anemia   ? Arthritis   ? ankles  ? Cancer [facility])   ? multiple myeloma  ? 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 Cardiomyopathy ([place])   ? CHF (congestive heart failure) ([place])   ? Depression   ? Dysrhythmia   ? atrial fib  ? HLD (hyperlipidemia)   ? Hypercholesteremia   ? Hypertension   ? Moderate tricuspid insufficiency   ? Multiple myeloma ([place])   ? not being treated right now per pt 11/11/19  ? Pneumonia   ? Sleep apnea   ? No CPAP  ? ?Past Surgical History:  ?Past Surgical History:  ?Procedure Laterality Date  ? ATRIAL ABLATION SURGERY    ? CARDIAC SURGERY    ? A-Fib Ablations  ? COLONOSCOPY WITH PROPOFOL N/A 10/26/2019  ? Procedure: COLONOSCOPY WITH BIOPSY;  Surgeon: Lucilla Lame, MD;  Location: [place];  Service: Endoscopy;  Laterality: N/A;  ? ESOPHAGOGASTRODUODENOSCOPY (EGD) WITH PROPOFOL N/A 10/26/2019  ? Procedure: ESOPHAGOGASTRODUODENOSCOPY (EGD) WITH BIOPSY;  Surgeon: Lucilla Lame, MD;  Location: [facility];  Service: Endoscopy;  Laterality: N/A;  sleep apnea  ? LAPAROSCOPIC PARTIAL COLECTOMY Right 11/17/2019  ? Procedure: LAPAROSCOPIC PARTIAL COLECTOMY RIGHT EXTENDED;  Surgeon: Olean Ree, MD;  Location: [facility];  Service: General;  Laterality: Right;  ? PORTACATH PLACEMENT N/A 12/28/2019  ? Procedure: INSERTION PORT-A-CATH Left subclavian;  Surgeon: Olean Ree, MD;  Location: [facility];  Service: General;  Laterality: N/A;   ? ?Social History:  ?Social History  ? ?Socioeconomic History  ? Marital status: Single  ?  Spouse name: Not on file  ? Number of children: Not on file  ? Years of education: Not on file  ? Highest education level: Not on file  ?Occupational History  ? Not on file  ?Tobacco Use  ? Smoking status: Never  ? Smokeless tobacco: Never  ?Vaping Use  ? Vaping Use: Never used  ?Substance and Sexual Activity  ? Alcohol use: Not Currently  ? Drug use: Never  ? Sexual activity: Yes  ?  Birth control/protection: None  ?Other Topics Concern  ? Not on file  ?Social History Narrative  ? Not on file  ? ?Social Determinants of Health  ? ?Financial Resource Strain: Not on file  ?Food Insecurity: Not on file  ?Transportation Needs: Not on file  ?Physical Activity: Not on file  ?Stress: Not on file  ?Social Connections: Not on file  ?Intimate Partner Violence: Not on file  ? ?Family History:  ?Family History  ?Problem Relation Age of Onset  ?  Healthy Mother   ? ? ?Review of Systems: ?Constitutional: Doesn't report fevers, chills or abnormal weight loss ?Eyes: Doesn't report blurriness of vision ?Ears, nose, mouth, throat, and face: Doesn't report sore throat ?Respiratory: Doesn't report cough, dyspnea or wheezes ?Cardiovascular: Doesn't report palpitation, chest discomfort  ?Gastrointestinal:  Doesn't report nausea, constipation, diarrhea ?GU: Doesn't report incontinence ?Skin: Doesn't report skin rashes ?Neurological: Per HPI ?Musculoskeletal: Doesn't report joint pain ?Behavioral/Psych: Doesn't report anxiety ? ?Physical Exam: ?Vitals:  ? 02/16/22 1046  ?BP: 121/71  ?Pulse: 66  ?Resp: 16  ?SpO2: 99%  ? ?KPS: 90. ?General: Alert, cooperative, pleasant, in no acute distress ?Head: Normal ?EENT: No conjunctival injection or scleral icterus.  ?Lungs: Resp effort normal ?Cardiac: Regular rate ?Abdomen: Non-distended abdomen ?Skin: No rashes cyanosis or petechiae. ?Extremities: No clubbing or edema ? ?Neurologic Exam: ?Mental Status:  Awake, alert, attentive to examiner. Oriented to self and environment. Language is fluent with intact comprehension.  ?Cranial Nerves: Visual acuity is grossly normal. Visual fields are full. Extra-ocular movements i

## 2022-03-12 ENCOUNTER — Encounter: Payer: Medicare HMO | Attending: Family Medicine

## 2022-03-12 VITALS — Ht 70.25 in | Wt 230.7 lb

## 2022-03-12 DIAGNOSIS — C184 Malignant neoplasm of transverse colon: Secondary | ICD-10-CM

## 2022-03-12 DIAGNOSIS — C9000 Multiple myeloma not having achieved remission: Secondary | ICD-10-CM

## 2022-03-12 NOTE — Progress Notes (Signed)
CARE Daily Session Note ? ?Patient Details  ?Name: Chris George ?MRN: [id number] ?Date of Birth: May 16, 1956 ?Referring Provider:   ?Flowsheet Row Cancer Associated Rehabilitation & Exercise from 03/12/2022 in [facility] Card[facility] and Pulmonary Rehab  ?Referring Provider Delight Hoh  ? ?  ? ? ?Encounter Date: 03/12/2022 ? ?Check In: ? Session Check In - 03/12/22 1247   ? ?  ? Check-In  ? Supervising physician immediately available to respond to emergencies See telemetry face sheet for immediately available ER MD   ? Location [facility]   ? Staff Present Coralie Keens, MS, ASCM CEP, Exercise Physiologist   ? Virtual Visit No   ? Medication changes reported     No   ? Fall or balance concerns reported    No   ? Warm-up and Cool-down Not performed (comment)   6MWT and Gym Orientation  ? Resistance Training Performed No   ? VAD Patient? No   ? PAD/SET Patient? No   ? ?  ?  ? ?  ? ? ? ? ? 6 Minute Walk   ? ? [place] Name 03/12/22 1249  ?  ?  ?  ? 6 Minute Walk  ? Phase Initial    ? Distance 1010 feet    ? Walk Time 4.8 minutes    ? # of Rest Breaks 1    ? MPH 2.41    ? METS 2.73    ? RPE 13    ? Perceived Dyspnea  1    ? VO2 Peak 9.56    ? Symptoms Yes (comment)    ? Comments Chest tightness 3/10- relieved with rest    ? Resting HR 61 bpm    ? Resting BP 118/70    ? Resting Oxygen Saturation  95 %    ? Exercise Oxygen Saturation  during 6 min walk 93 %    ? Max Ex. HR 131 bpm    ? Max Ex. BP 142/68    ? 2 Minute Post BP 112/66    ? ?  ?  ? ?  ?  ? ? Exercise Prescription Changes - 03/12/22 1200   ? ?  ? Response to Exercise  ? Blood Pressure (Admit) 118/70   ? Blood Pressure (Exercise) 142/68   ? Blood Pressure (Exit) 112/66   ? Heart Rate (Admit) 61 bpm   ? Heart Rate (Exercise) 131 bpm   ? Heart Rate (Exit) 65 bpm   ? Oxygen Saturation (Admit) 95 %   ? Oxygen Saturation (Exercise) 93 %   ? Oxygen Saturation (Exit) 97 %   ? Rating of Perceived Exertion (Exercise) 13   ? Perceived Dyspnea (Exercise) 1   ? Symptoms  chest tightness 3/10- relieved with rest   ? Comments walk test results   ? ?  ?  ? ?  ? ? ?Social History  ? ?Tobacco Use  ?Smoking Status Never  ?Smokeless Tobacco Never  ? ? ?Goals Met:  ?Proper associated with RPD/PD & O2 Sat ?Personal goals reviewed ?No report of concerns or symptoms today ? ?Goals Unmet:  ?Not Applicable ? ?Comments: First full day of orientation. All starting workloads were established based on the results of the 6 minute walk test done at initial orientation visit.  The plan for exercise progression was also introduced and progression will be customized based on patient's performance and goals.  ? ? ?Dr. Emily Filbert is Medical Director for [place].  ?Dr. Luvenia Heller  Lanney Gins is Market researcher for [employer]. ?

## 2022-03-15 ENCOUNTER — Encounter: Payer: Medicare HMO | Admitting: *Deleted

## 2022-03-15 DIAGNOSIS — C184 Malignant neoplasm of transverse colon: Secondary | ICD-10-CM

## 2022-03-15 NOTE — Progress Notes (Signed)
Daily Session Note ? ?Patient Details  ?Name: Chris George ?MRN: [id number] ?Date of Birth: Nov 01, 1956 ?Referring Provider:   ?Flowsheet Row Cancer Associated Rehabilitation & Exercise from 03/12/2022 in [facility] Cardiac and Pulmonary Rehab  ?Referring Provider Delight Hoh  ? ?  ? ? ?Encounter Date: 03/15/2022 ? ?Check In: ? Session Check In - 03/15/22 1229   ? ?  ? Check-In  ? Supervising physician immediately available to respond to emergencies See telemetry face sheet for immediately available ER MD   ? Location [facility]   ? Staff Present Alberteen Sam, MA, RCEP, CCRP, Marylynn Pearson, MS, ASCM CEP, Exercise Physiologist   ? Virtual Visit No   ? Medication changes reported     No   ? Fall or balance concerns reported    No   ? Warm-up and Cool-down Performed on first and last piece of equipment   ? Resistance Training Performed Yes   ? VAD Patient? No   ? PAD/SET Patient? No   ?  ? Pain Assessment  ? Currently in Pain? No/denies   ? ?  ?  ? ?  ? ? ? ? ? ?Social History  ? ?Tobacco Use  ?Smoking Status Never  ?Smokeless Tobacco Never  ? ? ?Goals Met:  ?Proper associated with RPD/PD & O2 Sat ?Exercise tolerated well ?Personal goals reviewed ?No report of concerns or symptoms today ?Strength training completed today ? ?Goals Unmet:  ?Not Applicable ? ?Comments: First full day of exercise!  Patient was oriented to gym and equipment including functions, settings, policies, and procedures.  Patient's individual exercise prescription and treatment plan were reviewed.  All starting workloads were established based on the results of the 6 minute walk test done at initial orientation visit.  The plan for exercise progression was also introduced and progression will be customized based on patient's performance and goals. ? ? ? ?Dr. Emily Filbert is Medical Director for [place].  ?Dr. Ottie Glazier is Medical Director for [facility] Pulmonary Rehabilitation. ?

## 2022-03-22 DIAGNOSIS — C9000 Multiple myeloma not having achieved remission: Secondary | ICD-10-CM

## 2022-03-22 DIAGNOSIS — C184 Malignant neoplasm of transverse colon: Secondary | ICD-10-CM

## 2022-03-22 NOTE — Progress Notes (Signed)
Daily Session Note  Patient Details  Name: Chris George MRN: [id number] Date of Birth: [birth year] Referring Provider:   April Manson Cancer Associated Rehabilitation & Exercise from 03/12/2022 in [facility] Cardiac and Pulmonary Rehab  Referring Provider Delight Hoh       Encounter Date: 03/22/2022  Check In:  Session Check In - 03/22/22 1225       Check-In   Supervising physician immediately available to respond to emergencies See telemetry face sheet for immediately available ER MD    Location [facility]-Cardiac & Pulmonary Rehab    Staff Present Alberteen Sam, MA, RCEP, CCRP, Marylynn Pearson, MS, ASCM CEP, Exercise Physiologist;Kerri-Anne Haeberle Desma Maxim, RN, BSN    Virtual Visit No    Medication changes reported     No    Fall or balance concerns reported    No    Warm-up and Cool-down Performed on first and last piece of equipment    Resistance Training Performed Yes    VAD Patient? No    PAD/SET Patient? No      Pain Assessment   Currently in Pain? No/denies                Social History   Tobacco Use  Smoking Status Never  Smokeless Tobacco Never    Goals Met:  Independence with exercise equipment Exercise tolerated well No report of concerns or symptoms today Strength training completed today  Goals Unmet:  Not Applicable  Comments: Pt able to follow exercise prescription today without complaint.  Will continue to monitor for progression.   Dr. Emily Filbert is Medical Director for [place].  Dr. Ottie Glazier is Medical Director for [facility] Pulmonary Rehabilitation.

## 2022-03-29 DIAGNOSIS — C9000 Multiple myeloma not having achieved remission: Secondary | ICD-10-CM

## 2022-03-29 NOTE — Progress Notes (Signed)
Daily Session Note  Patient Details  Name: Chris George MRN: [id number] Date of Birth: 1956-02-08 Referring Provider:   April Manson [facility] Associated Rehabilitation & Exercise from 03/12/2022 in [name] Cardiac and Pulmonary Rehab  Referring Provider Delight Hoh       Encounter Date: 03/29/2022  Check In:  Session Check In - 03/29/22 1401       Check-In   Supervising physician immediately available to respond to emergencies See telemetry face sheet for immediately available ER MD    Location [facility]-Cardiac & Pulmonary Rehab    Staff Present Coralie Keens, MS, ASCM CEP, Exercise Physiologist;Laureen Owens Shark, BS, RRT, CPFT    Virtual Visit No    Medication changes reported     No    Fall or balance concerns reported    No    Warm-up and Cool-down Performed on first and last piece of equipment    Resistance Training Performed Yes    VAD Patient? No    PAD/SET Patient? No                Social History   Tobacco Use  Smoking Status Never  Smokeless Tobacco Never    Goals Met:  Proper associated with RPD/PD & O2 Sat Independence with exercise equipment Exercise tolerated well No report of concerns or symptoms today Strength training completed today  Goals Unmet:  Not Applicable  Comments: Pt able to follow exercise prescription today without complaint.  Will continue to monitor for progression.    Dr. Emily Filbert is Medical Director for [place].  Dr. Ottie Glazier is Medical Director for [facility] Pulmonary Rehabilitation.

## 2022-04-03 DIAGNOSIS — C184 Malignant neoplasm of transverse colon: Secondary | ICD-10-CM

## 2022-04-03 DIAGNOSIS — C9000 Multiple myeloma not having achieved remission: Secondary | ICD-10-CM

## 2022-04-03 NOTE — Progress Notes (Signed)
Daily Session Note  Patient Details  Name: Chris George MRN: [id number] Date of Birth: December 16, 1955 Referring Provider:   April Manson Cancer Associated Rehabilitation & Exercise from 03/12/2022 in [facility] Cardiac and Pulmonary Rehab  Referring Provider Delight Hoh       Encounter Date: 04/03/2022  Check In:  Session Check In - 04/03/22 1215       Check-In   Supervising physician immediately available to respond to emergencies See telemetry face sheet for immediately available ER MD    Location [facility]-Cardiac & Pulmonary Rehab    Staff Present Birdie Sons, MPA, RN;Jessica Luan Pulling, MA, RCEP, CCRP, Marylynn Pearson, MS, ASCM CEP, Exercise Physiologist    Virtual Visit No    Medication changes reported     No    Fall or balance concerns reported    No    Warm-up and Cool-down Performed on first and last piece of equipment    Resistance Training Performed Yes    VAD Patient? No    PAD/SET Patient? No      Pain Assessment   Currently in Pain? No/denies                Social History   Tobacco Use  Smoking Status Never  Smokeless Tobacco Never    Goals Met:  Independence with exercise equipment Exercise tolerated well No report of concerns or symptoms today Strength training completed today  Goals Unmet:  Not Applicable  Comments: Pt able to follow exercise prescription today without complaint.  Will continue to monitor for progression.    Dr. Emily Filbert is Medical Director for [place].  Dr. Ottie Glazier is Medical Director for [facility] Pulmonary Rehabilitation.

## 2022-04-10 ENCOUNTER — Encounter: Payer: Medicare HMO | Attending: Family Medicine

## 2022-04-19 ENCOUNTER — Telehealth: Payer: Self-pay

## 2022-04-19 NOTE — Telephone Encounter (Signed)
Left message for patient regarding CARE program. 

## 2022-05-02 NOTE — Telephone Encounter (Signed)
Attempted to call patient again regarding CARE. Could not leave message as mailbox is full. Has not attended since 5/30 and have not heard back from patient. Discharge at the end of the week if no response.

## 2022-05-07 DIAGNOSIS — C9000 Multiple myeloma not having achieved remission: Secondary | ICD-10-CM

## 2022-05-07 DIAGNOSIS — C184 Malignant neoplasm of transverse colon: Secondary | ICD-10-CM

## 2022-05-07 NOTE — Progress Notes (Signed)
CARE Discharge Progress Report  Patient Details  Name: Chris George MRN: [id number] Date of Birth: Jan 18, 1956 Referring Provider:   April Manson Cancer Associated Rehabilitation & Exercise from 03/12/2022 in [facility] Cardiac and Pulmonary Rehab  Referring Provider Delight Hoh        Number of Visits: 5  Reason for Discharge:  Early Exit:  Lack of attendance  Smoking History:  Social History   Tobacco Use  Smoking Status Never  Smokeless Tobacco Never    Diagnosis:  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[place])  Multiple myeloma, remission status unspecified ([place])  ADL UCSD:   Initial Exercise Prescription:  Initial Exercise Prescription - 03/12/22 1200       Date of Initial Exercise RX and Referring Provider   Date 03/12/22    Referring Provider Delight Hoh      Oxygen   Maintain Oxygen Saturation 88% or higher      NuStep   SPM 80    Minutes 15    METs 2.7      REL-XR   Level 1    Speed 50    Minutes 15    METs 2.7      Biostep-RELP   Level 1    SPM 50    Minutes 15    METs 2.7      Track   Laps 14    Minutes 15    METs 1.7      Prescription Details   Frequency (times per week) 2    Duration Progress to 30 minutes of continuous aerobic without signs/symptoms of physical distress      Intensity   THRR 40-80% of Max Heartrate 98- 135    Ratings of Perceived Exertion 11-13    Perceived Dyspnea 0-4      Progression   Progression Continue to progress workloads to maintain intensity without signs/symptoms of physical distress.      Resistance Training   Training Prescription Yes    Weight 3 lb    Reps 10-15             Discharge Exercise Prescription (Final Exercise Prescription Changes):  Exercise Prescription Changes - 03/12/22 1200       Response to Exercise   Blood Pressure (Admit) 118/70    Blood Pressure (Exercise) 142/68    Blood Pressure (Exit) 112/66    Heart Rate (Admit) 61 bpm    Heart Rate (Exercise) 131 bpm    Heart  Rate (Exit) 65 bpm    Oxygen Saturation (Admit) 95 %    Oxygen Saturation (Exercise) 93 %    Oxygen Saturation (Exit) 97 %    Rating of Perceived Exertion (Exercise) 13    Perceived Dyspnea (Exercise) 1    Symptoms chest tightness 3/10- relieved with rest    Comments walk test results             Functional Capacity:  6 Minute Walk     Row Name 03/12/22 1249         6 Minute Walk   Phase Initial     Distance 1010 feet     Walk Time 4.8 minutes     # of Rest Breaks 1     MPH 2.41     METS 2.73     RPE 13     Perceived Dyspnea  1     VO2 Peak 9.56     Symptoms Yes (comment)     Comments Chest tightness 3/10- relieved with rest  Resting HR 61 bpm     Resting BP 118/70     Resting Oxygen Saturation  95 %     Exercise Oxygen Saturation  during 6 min walk 93 %     Max Ex. HR 131 bpm     Max Ex. BP 142/68     2 Minute Post BP 112/66               Nutrition & Weight - Outcomes:  Pre Biometrics - 03/12/22 1247       Pre Biometrics   Height 5' 10.25" (1.784 m)    Weight 230 lb 11.2 oz (104.6 kg)    BMI (Calculated) 32.88             Goals reviewed with patient; copy given to patient.

## 2022-05-15 ENCOUNTER — Other Ambulatory Visit: Payer: Medicare HMO

## 2022-05-15 ENCOUNTER — Encounter: Payer: Medicare HMO | Attending: Family Medicine

## 2022-05-21 ENCOUNTER — Inpatient Hospital Stay: Payer: Medicare HMO | Attending: Oncology

## 2022-08-14 ENCOUNTER — Inpatient Hospital Stay: Payer: Medicare HMO | Attending: Oncology

## 2022-08-14 DIAGNOSIS — G629 Polyneuropathy, unspecified: Secondary | ICD-10-CM | POA: Insufficient documentation

## 2022-08-14 DIAGNOSIS — C182 Malignant neoplasm of ascending colon: Secondary | ICD-10-CM | POA: Insufficient documentation

## 2022-08-14 DIAGNOSIS — C184 Malignant neoplasm of transverse colon: Secondary | ICD-10-CM | POA: Diagnosis present

## 2022-08-14 DIAGNOSIS — R972 Elevated prostate specific antigen [PSA]: Secondary | ICD-10-CM | POA: Insufficient documentation

## 2022-08-14 DIAGNOSIS — D72819 Decreased white blood cell count, unspecified: Secondary | ICD-10-CM | POA: Diagnosis not present

## 2022-08-14 DIAGNOSIS — D649 Anemia, unspecified: Secondary | ICD-10-CM | POA: Diagnosis not present

## 2022-08-14 DIAGNOSIS — D472 Monoclonal gammopathy: Secondary | ICD-10-CM | POA: Insufficient documentation

## 2022-08-14 DIAGNOSIS — D696 Thrombocytopenia, unspecified: Secondary | ICD-10-CM | POA: Diagnosis not present

## 2022-08-14 DIAGNOSIS — N289 Disorder of kidney and ureter, unspecified: Secondary | ICD-10-CM | POA: Insufficient documentation

## 2022-08-14 DIAGNOSIS — C9000 Multiple myeloma not having achieved remission: Secondary | ICD-10-CM

## 2022-08-14 LAB — CBC WITH DIFFERENTIAL/PLATELET
Abs Immature Granulocytes: 0 10*3/uL (ref 0.00–0.07)
Basophils Absolute: 0 10*3/uL (ref 0.0–0.1)
Basophils Relative: 1 %
Eosinophils Absolute: 0.1 10*3/uL (ref 0.0–0.5)
Eosinophils Relative: 5 %
HCT: 33.9 % — ABNORMAL LOW (ref 39.0–52.0)
Hemoglobin: 10.9 g/dL — ABNORMAL LOW (ref 13.0–17.0)
Immature Granulocytes: 0 %
Lymphocytes Relative: 21 %
Lymphs Abs: 0.6 10*3/uL — ABNORMAL LOW (ref 0.7–4.0)
MCH: 28.2 pg (ref 26.0–34.0)
MCHC: 32.2 g/dL (ref 30.0–36.0)
MCV: 87.8 fL (ref 80.0–100.0)
Monocytes Absolute: 0.4 10*3/uL (ref 0.1–1.0)
Monocytes Relative: 16 %
Neutro Abs: 1.5 10*3/uL — ABNORMAL LOW (ref 1.7–7.7)
Neutrophils Relative %: 57 %
Platelets: 105 10*3/uL — ABNORMAL LOW (ref 150–400)
RBC: 3.86 MIL/uL — ABNORMAL LOW (ref 4.22–5.81)
RDW: 13.8 % (ref 11.5–15.5)
WBC: 2.6 10*3/uL — ABNORMAL LOW (ref 4.0–10.5)
nRBC: 0 % (ref 0.0–0.2)

## 2022-08-14 LAB — BASIC METABOLIC PANEL
Anion gap: 5 (ref 5–15)
BUN: 22 mg/dL (ref 8–23)
CO2: 28 mmol/L (ref 22–32)
Calcium: 8.6 mg/dL — ABNORMAL LOW (ref 8.9–10.3)
Chloride: 104 mmol/L (ref 98–111)
Creatinine, Ser: 1.77 mg/dL — ABNORMAL HIGH (ref 0.61–1.24)
GFR, Estimated: 42 mL/min — ABNORMAL LOW (ref >60)
Glucose, Bld: 130 mg/dL — ABNORMAL HIGH (ref 70–99)
Potassium: 3.7 mmol/L (ref 3.5–5.1)
Sodium: 137 mmol/L (ref 135–145)

## 2022-08-14 LAB — PSA: Prostatic Specific Antigen: 24.44 ng/mL — ABNORMAL HIGH (ref 0.00–4.00)

## 2022-08-15 ENCOUNTER — Other Ambulatory Visit: Payer: Medicare HMO

## 2022-08-15 ENCOUNTER — Ambulatory Visit: Admission: RE | Admit: 2022-08-15 | Payer: Medicare HMO | Source: Ambulatory Visit

## 2022-08-15 LAB — KAPPA/LAMBDA LIGHT CHAINS
Kappa free light chain: 49.1 mg/L — ABNORMAL HIGH (ref 3.3–19.4)
Kappa, lambda light chain ratio: 1.73 — ABNORMAL HIGH (ref 0.26–1.65)
Lambda free light chains: 28.3 mg/L — ABNORMAL HIGH (ref 5.7–26.3)

## 2022-08-16 ENCOUNTER — Ambulatory Visit
Admission: RE | Admit: 2022-08-16 | Discharge: 2022-08-16 | Disposition: A | Discharge status: Home / Self Care | Payer: Medicare HMO | Source: Ambulatory Visit | Attending: Oncology | Admitting: Oncology

## 2022-08-16 DIAGNOSIS — C184 Malignant neoplasm of transverse colon: Secondary | ICD-10-CM | POA: Diagnosis present

## 2022-08-16 LAB — CEA: CEA: 18.3 ng/mL — ABNORMAL HIGH (ref 0.0–4.7)

## 2022-08-16 LAB — IGG, IGA, IGM
IgA: 79 mg/dL (ref 61–437)
IgG (Immunoglobin G), Serum: 2785 mg/dL — ABNORMAL HIGH (ref 603–1613)
IgM (Immunoglobulin M), Srm: 98 mg/dL (ref 20–172)

## 2022-08-16 MED ORDER — IOHEXOL 300 MG/ML  SOLN
80.0000 mL | Freq: Once | INTRAMUSCULAR | Status: AC | PRN
  Administered 2022-08-16: 80 mL via INTRAVENOUS

## 2022-08-16 NOTE — Progress Notes (Signed)
[place]  [phone] [phone]  ID: Chris George OB: 11/18/1955  MR#: [id number]  JAS#:[id number]  Patient Care Team: Sofie Hartigan, MD as PCP - General (Family Medicine) Lloyd Huger, MD as Consulting Physician (Oncology) Anabel Bene, MD as Referring Physician (Neurology)  CHIEF COMPLAINT: Recurrent colon cancer, smoldering myeloma.  INTERVAL HISTORY: Patient returns to clinic today for repeat laboratory work, routine 45-month evaluation, and discussion of his imaging results.  He continues to have neuropathy in his feet, but describes it more as joint pain than numbness or tingling.  He also endorses depressive symptoms since he can no longer play tennis.  He has no other neurologic complaints. He has no chest pain, shortness of breath, cough, or hemoptysis.  He denies any nausea, vomiting, constipation, or diarrhea.  He has no melena or hematochezia.  He has no urinary complaints.  Patient is no further specific complaints today.  REVIEW OF SYSTEMS:   Review of Systems  Constitutional:  Positive for malaise/fatigue. Negative for fever and weight loss.  Respiratory: Negative.  Negative for cough, hemoptysis and shortness of breath.   Cardiovascular: Negative.  Negative for chest pain and leg swelling.  Gastrointestinal: Negative.  Negative for abdominal pain, blood in stool and melena.  Genitourinary: Negative.  Negative for hematuria.  Musculoskeletal:  Positive for joint pain. Negative for back pain.  Skin: Negative.  Negative for rash.  Neurological:  Positive for tingling, sensory change and weakness. Negative for dizziness, focal weakness and headaches.  Psychiatric/Behavioral:  Positive for depression. The patient is not nervous/anxious.     As per HPI. Otherwise, a complete review of systems is negative.  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[place]) 10/26/2019   Cardiomyopathy ([place])    CHF (congestive heart failure) (HCC)    Depression    Dysrhythmia    atrial fib   HLD (hyperlipidemia)    Hypercholesteremia    Hypertension    Moderate tricuspid insufficiency    Multiple myeloma (HCC)    not being treated right now per pt 11/11/19   Pneumonia    Sleep apnea    No CPAP    PAST SURGICAL HISTORY: Past Surgical History:  Procedure Laterality Date   ATRIAL ABLATION SURGERY     CARDIAC SURGERY     A-Fib Ablations   COLONOSCOPY WITH PROPOFOL N/A 10/26/2019   Procedure: COLONOSCOPY WITH BIOPSY;  Surgeon: Lucilla Lame, MD;  Location: [place];  Service: Endoscopy;  Laterality: N/A;   ESOPHAGOGASTRODUODENOSCOPY (EGD) WITH PROPOFOL N/A 10/26/2019   Procedure: ESOPHAGOGASTRODUODENOSCOPY (EGD) WITH BIOPSY;  Surgeon: Lucilla Lame, MD;  Location: [place];  Service: Endoscopy;  Laterality: N/A;  sleep apnea   LAPAROSCOPIC PARTIAL COLECTOMY Right 11/17/2019   Procedure: LAPAROSCOPIC PARTIAL COLECTOMY RIGHT EXTENDED;  Surgeon: Olean Ree, MD;  Location: [facility];  Service: General;  Laterality: Right;   PORTACATH PLACEMENT N/A 12/28/2019   Procedure: INSERTION PORT-A-CATH Left subclavian;  Surgeon: Olean Ree, MD;  Location: [facility];  Service: General;  Laterality: N/A;    FAMILY HISTORY: Family History  Problem Relation Age of Onset   Healthy Mother     ADVANCED DIRECTIVES (Y/N):  N  HEALTH MAINTENANCE: Social History   Tobacco Use   Smoking status: Never   Smokeless tobacco: Never  Vaping Use   Vaping Use: Never used  Substance Use Topics   Alcohol use: Not Currently   Drug use: Never     Colonoscopy:  PAP:  Bone density:  Lipid panel:  No Known Allergies  Current Outpatient Medications  Medication Sig Dispense Refill   APPLE CIDER VINEGAR PO Take 30-45 mLs by mouth every other day.      aspirin 325 MG EC tablet Take  325 mg by mouth every other day. In the morning     digoxin (LANOXIN) 0.25 MG tablet Take 0.25 mg by mouth daily.      DULoxetine (CYMBALTA) 20 MG capsule Take 1 capsule (20 mg total) by mouth daily. 30 capsule 3   furosemide (LASIX) 80 MG tablet Take 80 mg by mouth daily. Morning & late afternoon     Misc Natural Products (TART CHERRY ADVANCED PO) Take 60 mLs by mouth every other day.     potassium bicarbonate (K-LYTE) 25 MEQ disintegrating tablet Take 25 mEq by mouth 4 (four) times a week. In the afternoon.     calcitRIOL (ROCALTROL) 0.25 MCG capsule Take by mouth. (Patient not taking: Reported on 03/12/2022)     colchicine 0.6 MG tablet Take 0.6 mg by mouth 2 (two) times daily. (Patient not taking: Reported on 03/12/2022)     spironolactone (ALDACTONE) 25 MG tablet Take by mouth.     No current facility-administered medications for this visit.    OBJECTIVE: Vitals:   08/21/22 1136  BP: (!) 122/56  Pulse: (!) 55  Temp: (!) 97.5 F (36.4 C)      Body mass index is 32.77 kg/m.    ECOG FS:0 - Asymptomatic  General: Well-developed, well-nourished, no acute distress. Eyes: Pink conjunctiva, anicteric sclera. HEENT: Normocephalic, moist mucous membranes. Lungs: No audible wheezing or coughing. Heart: Regular rate and rhythm. Abdomen: Soft, nontender, no obvious distention. Musculoskeletal: No edema, cyanosis, or clubbing. Neuro: Alert, answering all questions appropriately. Cranial nerves grossly intact. Skin: No rashes or petechiae noted. Psych: Normal affect.  LAB RESULTS:  Lab Results  Component Value Date   NA 137 08/14/2022   K 3.7 08/14/2022   CL 104 08/14/2022   CO2 28 08/14/2022   GLUCOSE 130 (H) 08/14/2022   BUN 22 08/14/2022   CREATININE 1.77 (H) 08/14/2022   CALCIUM 8.6 (L) 08/14/2022   PROT 8.9 (H) 01/31/2022   ALBUMIN 3.9 01/31/2022   AST 37 01/31/2022   ALT 28 01/31/2022   ALKPHOS 160 (H) 01/31/2022   BILITOT 0.8 01/31/2022   GFRNONAA 42 (L) 08/14/2022    GFRAA >60 07/29/2020    Lab Results  Component Value Date   WBC 2.6 (L) 08/14/2022   NEUTROABS 1.5 (L) 08/14/2022   HGB 10.9 (L) 08/14/2022   HCT 33.9 (L) 08/14/2022   MCV 87.8 08/14/2022   PLT 105 (L) 08/14/2022   Lab Results  Component Value Date   IRON 41 (L) 05/11/2020   TIBC 323 05/11/2020   IRONPCTSAT 13 (L) 05/11/2020   Lab Results  Component Value Date   FERRITIN 86 05/11/2020     STUDIES: CT CHEST ABDOMEN PELVIS W CONTRAST  Result Date: 08/18/2022 CLINICAL DATA:  Colorectal cancer restaging. History of partial colon resection in 2021 with chemotherapy and radiation therapy. Intermittent cough for 3-4 months. * Tracking Code: BO * EXAM: CT CHEST, ABDOMEN, AND PELVIS WITH CONTRAST TECHNIQUE: Multidetector CT imaging of the chest, abdomen and pelvis was performed following the standard protocol during bolus administration of intravenous contrast. RADIATION  DOSE REDUCTION: This exam was performed according to the departmental dose-optimization program which includes automated exposure control, adjustment of the mA and/or kV according to patient size and/or use of iterative reconstruction technique. CONTRAST:  75mL OMNIPAQUE IOHEXOL 300 MG/ML  SOLN COMPARISON:  Prior CTs 02/09/2022 and 06/07/2021. PET-CT 06/22/2021. FINDINGS: CT CHEST FINDINGS Cardiovascular: No acute vascular findings. There is stable central enlargement of the pulmonary arteries consistent with pulmonary arterial hypertension. Left subclavian Port-A-Cath extends to the upper SVC level. The heart is enlarged. There is no pericardial effusion. Mediastinum/Nodes: There are no enlarged mediastinal, hilar or axillary lymph nodes. The thyroid gland, trachea and esophagus demonstrate no significant findings. Lungs/Pleura: No pleural effusion or pneumothorax. Stable scattered benign calcified pulmonary nodules bilaterally. No new or enlarging pulmonary nodules. Musculoskeletal/Chest wall: No chest wall mass or suspicious  osseous findings. Progressive asymmetric gynecomastia on the left. CT ABDOMEN AND PELVIS FINDINGS Hepatobiliary: Unchanged contour irregularity of the liver, suspicious for cirrhosis. No focal lesion or abnormal enhancement identified. No evidence of gallstones, gallbladder wall thickening or biliary dilatation. Pancreas: Unremarkable. No pancreatic ductal dilatation or surrounding inflammatory changes. Spleen: Normal in size without focal abnormality. Adrenals/Urinary Tract: Both adrenal glands appear normal. No evidence of urinary tract calculus, suspicious renal lesion or hydronephrosis. Unchanged small low-density renal lesions bilaterally which are likely cysts in do not require any specific imaging follow-up. The bladder appears unremarkable for its degree of distention. Stomach/Bowel: Enteric contrast was administered and has passed into the distal colon. The stomach appears unremarkable for its degree of distension. No evidence of bowel wall thickening, distention or surrounding inflammatory change. Stable postsurgical changes from right hemicolectomy and ileocolonic anastomosis. Vascular/Lymphatic: Unchanged posterior aortocaval node measuring 9 mm short axis on image 75/2. No progressive abdominopelvic adenopathy. Mild aortic and branch vessel atherosclerosis without aneurysm or large vessel occlusion. Stable venous collaterals along the left pelvic sidewall. Reproductive: Moderate enlargement of the prostate gland. Other: Stable prominent fat in both inguinal canals. No ascites or peritoneal nodularity. Musculoskeletal: No acute or significant osseous findings. IMPRESSION: 1. Stable CTS of the chest, abdomen and pelvis status post right hemicolectomy and ileocolonic anastomosis. No evidence of local recurrence or progressive metastatic disease. Treated posterior aortocaval lymph node has not significantly changed from the prior examination of 6 months ago. 2. Progressive asymmetric gynecomastia on the  left. 3. Stable contour irregularity of the liver, suspicious for cirrhosis. 4. Stable central enlargement of the pulmonary arteries consistent with pulmonary arterial hypertension. 5.  Aortic Atherosclerosis (ICD10-I70.0). Electronically Signed   By: Richardean Sale M.D.   On: 08/18/2022 12:31     ASSESSMENT: Stage IIIc colon cancer, smoldering myeloma.  PLAN:    1.  Stage IIIc colon cancer: Patient completed cycle 9 of adjuvant FOLFOX on June 03, 2020.  Given his difficulties with treatment and declining performance status, treatment was discontinued altogether.  Patient's last colonoscopy was on October 26, 2019.  He has not had a repeat procedure since completion of treatment.  Repeat CT scan on August 16, 2022 reviewed independently and reported as above with no obvious evidence of recurrent or progressive disease.  Despite this, patient's CEA has trended up to 18.3.  Patient was given a referral back to GI for repeat colonoscopy.  Return to clinic in 3 months for repeat laboratory work and further evaluation.   2. Smoldering myeloma: Patient was initially diagnosed and treated in [place], [place] and treated with single agent Velcade in approximately 2013.  He was evaluated in clinic in July  2014 when he declined any maintenance treatment or referral for bone marrow transplant.  He was subsequently lost to follow-up.  His M spike has ranged from 1.1-2.0 since July 2020.  Today's result is 1.8.  Over the same timeframe his IgG component has ranged from [phone].  His most recent result is 2785.  Finally, his kappa free light chains have ranged from 48.1-75.3 with his most recent result at 49.1.  Metastatic bone survey on May 20, 2019 reviewed independently with no obvious bony lesions.  CT scan results as above.  His most recent bone marrow biopsy completed on May 28, 2019 revealed only 10 to 15% plasma cells with no clonality reported.  Cytogenetics were also reported as normal.  Continue to  monitor closely.  Repeat laboratory work in 3 months as above.  3.  Anemia: Likely multifactorial, including renal insufficiency.  Hemoglobin improved to 10.9.  He last received IV Feraheme on October 06, 2019.   4.  Renal insufficiency: Creatinine has improved to 1.77.  Continue follow-up with nephrology as indicated.   5.  Peripheral neuropathy: Patient does not describe much numbness and tingling, but rather joint pain similar to his gout pain.  Gabapentin was previously discontinued.  Patient was given a referral to Occupational Therapy as well as initiated on Cymbalta.   6.  Leukopenia: Chronic and unchanged.. 7. Thrombocytopenia: Chronic and unchanged.  Patient's platelet count is 105.   8.  Elevated PSA: Patient PSA continues to increase and is now 24.44.  He was given a referral to urology for further evaluation. 9.  Depressive-like symptoms: Cymbalta 20 mg daily as above.  Return to clinic in 3 months as above.  Patient expressed understanding and was in agreement with this plan. He also understands that He can call clinic at any time with any questions, concerns, or complaints.    Lloyd Huger, MD   08/21/2022 12:08 PM

## 2022-08-17 LAB — PROTEIN ELECTROPHORESIS, SERUM
A/G Ratio: 0.9 (ref 0.7–1.7)
Albumin ELP: 3.6 g/dL (ref 2.9–4.4)
Alpha-1-Globulin: 0.3 g/dL (ref 0.0–0.4)
Alpha-2-Globulin: 0.6 g/dL (ref 0.4–1.0)
Beta Globulin: 1 g/dL (ref 0.7–1.3)
Gamma Globulin: 2.3 g/dL — ABNORMAL HIGH (ref 0.4–1.8)
Globulin, Total: 4.1 g/dL — ABNORMAL HIGH (ref 2.2–3.9)
M-Spike, %: 1.8 g/dL — ABNORMAL HIGH
Total Protein ELP: 7.7 g/dL (ref 6.0–8.5)

## 2022-08-21 ENCOUNTER — Inpatient Hospital Stay (HOSPITAL_BASED_OUTPATIENT_CLINIC_OR_DEPARTMENT_OTHER): Payer: Medicare HMO | Admitting: Oncology

## 2022-08-21 ENCOUNTER — Telehealth: Payer: Self-pay

## 2022-08-21 ENCOUNTER — Other Ambulatory Visit: Payer: Self-pay

## 2022-08-21 ENCOUNTER — Encounter: Payer: Self-pay | Admitting: Oncology

## 2022-08-21 VITALS — BP 122/56 | HR 55 | Temp 97.5°F | Ht 70.25 in | Wt 230.0 lb

## 2022-08-21 DIAGNOSIS — C182 Malignant neoplasm of ascending colon: Secondary | ICD-10-CM | POA: Diagnosis not present

## 2022-08-21 DIAGNOSIS — T451X5A Adverse effect of antineoplastic and immunosuppressive drugs, initial encounter: Secondary | ICD-10-CM

## 2022-08-21 DIAGNOSIS — C184 Malignant neoplasm of transverse colon: Secondary | ICD-10-CM

## 2022-08-21 DIAGNOSIS — G620 Drug-induced polyneuropathy: Secondary | ICD-10-CM

## 2022-08-21 DIAGNOSIS — C9000 Multiple myeloma not having achieved remission: Secondary | ICD-10-CM | POA: Diagnosis not present

## 2022-08-21 DIAGNOSIS — Z1211 Encounter for screening for malignant neoplasm of colon: Secondary | ICD-10-CM

## 2022-08-21 DIAGNOSIS — R972 Elevated prostate specific antigen [PSA]: Secondary | ICD-10-CM

## 2022-08-21 MED ORDER — DULOXETINE HCL 20 MG PO CPEP: 20.0000 mg | ORAL_CAPSULE | Freq: Every day | ORAL | 3 refills | Status: DC

## 2022-08-21 MED ORDER — NA SULFATE-K SULFATE-MG SULF 17.5-3.13-1.6 GM/177ML PO SOLN: 1.0000 | Freq: Once | ORAL | 0 refills | Status: AC

## 2022-08-21 NOTE — Telephone Encounter (Signed)
Gastroenterology Pre-Procedure Review  Request Date: 09/03/22 Requesting Physician: Dr. Allen Norris  PATIENT REVIEW QUESTIONS: The patient responded to the following health history questions as indicated:    1. Are you having any GI issues? no 2. Do you have a personal history of Polyps? yes (personal history of colon cancer 2020-2021) 3. Do you have a family history of Colon Cancer or Polyps? no 4. Diabetes Mellitus? no 5. Joint replacements in the past 12 months?no 6. Major health problems in the past 3 months?no 7. Any artificial heart valves, MVP, or defibrillator?Patient has atrial fib cardiologist Dr. Nehemiah Massed clearance to be sent     [place]:    Patient reports the following regarding taking any anticoagulation/antiplatelet therapy:   Plavix, Coumadin, Eliquis, Xarelto, Lovenox, Pradaxa, Brilinta, or Effient? no Aspirin? yes (Aspirin '325mg'$  every other day will send advice request to Dr. Nehemiah Massed)  Patient confirms/reports the following medications:  Current Outpatient Medications  Medication Sig Dispense Refill   APPLE CIDER VINEGAR PO Take 30-45 mLs by mouth every other day.      aspirin 325 MG EC tablet Take 325 mg by mouth every other day. In the morning     calcitRIOL (ROCALTROL) 0.25 MCG capsule Take by mouth. (Patient not taking: Reported on 03/12/2022)     colchicine 0.6 MG tablet Take 0.6 mg by mouth 2 (two) times daily. (Patient not taking: Reported on 03/12/2022)     digoxin (LANOXIN) 0.25 MG tablet Take 0.25 mg by mouth daily.      DULoxetine (CYMBALTA) 20 MG capsule Take 1 capsule (20 mg total) by mouth daily. 30 capsule 3   furosemide (LASIX) 80 MG tablet Take 80 mg by mouth daily. Morning & late afternoon     Misc Natural Products (TART CHERRY ADVANCED PO) Take 60 mLs by mouth every other day.     potassium bicarbonate (K-LYTE) 25 MEQ disintegrating tablet Take 25 mEq by mouth 4 (four) times a week. In the afternoon.     spironolactone (ALDACTONE) 25 MG  tablet Take by mouth.     No current facility-administered medications for this visit.    Patient confirms/reports the following allergies:  No Known Allergies  No orders of the defined types were placed in this encounter.   AUTHORIZATION INFORMATION Primary Insurance: 1D#: Group #:  Secondary Insurance: 1D#: Group #:  SCHEDULE INFORMATION: Date: 09/03/22 Time: Location: [place]

## 2022-08-28 ENCOUNTER — Other Ambulatory Visit: Payer: Self-pay

## 2022-08-28 MED ORDER — GOLYTELY 236 G PO SOLR: 4000.0000 mL | Freq: Once | ORAL | 0 refills | Status: AC

## 2022-08-29 ENCOUNTER — Encounter: Payer: Self-pay | Admitting: Gastroenterology

## 2022-08-30 ENCOUNTER — Telehealth: Payer: Self-pay

## 2022-08-30 NOTE — Telephone Encounter (Signed)
Per Dr. Nehemiah Massed completed Medical Clearance received 08/29/22 patient has been cleared to have colonoscopy. He was advised by Dr. Nehemiah Massed during his 08/29/22 office visit with Dr. Nehemiah Massed to hold Aspirin '325mg'$   4 days prior to his colonoscopy.  Thanks,  [place], [place]

## 2022-09-03 ENCOUNTER — Encounter: Payer: Self-pay | Admitting: Gastroenterology

## 2022-09-03 ENCOUNTER — Ambulatory Visit: Payer: Medicare HMO | Admitting: Anesthesiology

## 2022-09-03 ENCOUNTER — Ambulatory Visit
Admission: RE | Admit: 2022-09-03 | Discharge: 2022-09-03 | Disposition: A | Discharge status: Home / Self Care | Payer: Medicare HMO | Attending: Gastroenterology | Admitting: Gastroenterology

## 2022-09-03 ENCOUNTER — Encounter
Admission: RE | Disposition: A | Discharge status: Home / Self Care | Payer: Self-pay | Source: Home / Self Care | Attending: Gastroenterology

## 2022-09-03 ENCOUNTER — Other Ambulatory Visit: Payer: Self-pay

## 2022-09-03 DIAGNOSIS — E7800 Pure hypercholesterolemia, unspecified: Secondary | ICD-10-CM | POA: Insufficient documentation

## 2022-09-03 DIAGNOSIS — C184 Malignant neoplasm of transverse colon: Secondary | ICD-10-CM

## 2022-09-03 DIAGNOSIS — Z85038 Personal history of other malignant neoplasm of large intestine: Secondary | ICD-10-CM | POA: Diagnosis not present

## 2022-09-03 DIAGNOSIS — I110 Hypertensive heart disease with heart failure: Secondary | ICD-10-CM | POA: Diagnosis not present

## 2022-09-03 DIAGNOSIS — Z1211 Encounter for screening for malignant neoplasm of colon: Secondary | ICD-10-CM | POA: Insufficient documentation

## 2022-09-03 DIAGNOSIS — Z8579 Personal history of other malignant neoplasms of lymphoid, hematopoietic and related tissues: Secondary | ICD-10-CM | POA: Insufficient documentation

## 2022-09-03 DIAGNOSIS — I4891 Unspecified atrial fibrillation: Secondary | ICD-10-CM | POA: Diagnosis not present

## 2022-09-03 DIAGNOSIS — K640 First degree hemorrhoids: Secondary | ICD-10-CM | POA: Insufficient documentation

## 2022-09-03 DIAGNOSIS — I509 Heart failure, unspecified: Secondary | ICD-10-CM | POA: Insufficient documentation

## 2022-09-03 DIAGNOSIS — I429 Cardiomyopathy, unspecified: Secondary | ICD-10-CM | POA: Insufficient documentation

## 2022-09-03 HISTORY — PX: COLONOSCOPY WITH PROPOFOL: SHX5780

## 2022-09-03 HISTORY — DX: Adverse effect of antineoplastic and immunosuppressive drugs, initial encounter: T45.1X5A

## 2022-09-03 HISTORY — DX: Drug-induced polyneuropathy: G62.0

## 2022-09-03 SURGERY — COLONOSCOPY WITH PROPOFOL
Anesthesia: General

## 2022-09-03 MED ORDER — LACTATED RINGERS IV SOLN: INTRAVENOUS | Status: DC

## 2022-09-03 MED ORDER — STERILE WATER FOR IRRIGATION IR SOLN
Status: DC | PRN
  Administered 2022-09-03: 1000 mL

## 2022-09-03 MED ORDER — LIDOCAINE HCL (CARDIAC) PF 100 MG/5ML IV SOSY
PREFILLED_SYRINGE | INTRAVENOUS | Status: DC | PRN
  Administered 2022-09-03: 100 mg via INTRAVENOUS

## 2022-09-03 MED ORDER — PROPOFOL 10 MG/ML IV BOLUS
INTRAVENOUS | Status: DC | PRN
  Administered 2022-09-03: 50 mg via INTRAVENOUS
  Administered 2022-09-03: 200 ug/kg/min via INTRAVENOUS

## 2022-09-03 MED ORDER — SODIUM CHLORIDE 0.9 % IV SOLN: INTRAVENOUS | Status: DC

## 2022-09-03 SURGICAL SUPPLY — 21 items
CLIP HMST 235XBRD CATH ROT (MISCELLANEOUS) IMPLANT
CLIP RESOLUTION 360 11X235 (MISCELLANEOUS)
ELECT REM PT RETURN 9FT ADLT (ELECTROSURGICAL)
ELECTRODE REM PT RTRN 9FT ADLT (ELECTROSURGICAL) IMPLANT
FORCEPS BIOP RAD 4 LRG CAP 4 (CUTTING FORCEPS) IMPLANT
GOWN CVR UNV OPN BCK APRN NK (MISCELLANEOUS) ×2 IMPLANT
GOWN ISOL THUMB LOOP REG UNIV (MISCELLANEOUS) ×2
INJECTOR VARIJECT VIN23 (MISCELLANEOUS) IMPLANT
KIT DEFENDO VALVE AND CONN (KITS) IMPLANT
KIT PRC NS LF DISP ENDO (KITS) ×1 IMPLANT
KIT PROCEDURE OLYMPUS (KITS) ×1
MANIFOLD NEPTUNE II (INSTRUMENTS) ×1 IMPLANT
MARKER SPOT ENDO TATTOO 5ML (MISCELLANEOUS) IMPLANT
PROBE APC STR FIRE (PROBE) IMPLANT
RETRIEVER NET ROTH 2.5X230 LF (MISCELLANEOUS) IMPLANT
SNARE COLD EXACTO (MISCELLANEOUS) IMPLANT
SNARE SHORT THROW 13M SML OVAL (MISCELLANEOUS) IMPLANT
SNARE SNG USE RND 15MM (INSTRUMENTS) IMPLANT
TRAP ETRAP POLY (MISCELLANEOUS) IMPLANT
VARIJECT INJECTOR VIN23 (MISCELLANEOUS)
WATER STERILE IRR 250ML POUR (IV SOLUTION) ×1 IMPLANT

## 2022-09-03 NOTE — H&P (Signed)
Lucilla Lame, MD [address] [place], [place] [address] [phone] Fax : [phone]  Primary Care Physician:  Sofie Hartigan, MD Primary Gastroenterologist:  Dr. Allen Norris  Pre-Procedure History & Physical: HPI:  Chris George is a 66 y.o. male is here for an colonoscopy.   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  OSA resolved with wt loss.    Past Surgical History:  Procedure Laterality Date   ATRIAL ABLATION SURGERY     CARDIAC SURGERY     A-Fib Ablations   COLONOSCOPY WITH PROPOFOL N/A 10/26/2019   Procedure: COLONOSCOPY WITH BIOPSY;  Surgeon: Lucilla Lame, MD;  Location: [facility];  Service: Endoscopy;  Laterality: N/A;   ESOPHAGOGASTRODUODENOSCOPY (EGD) WITH PROPOFOL N/A 10/26/2019   Procedure: ESOPHAGOGASTRODUODENOSCOPY (EGD) WITH BIOPSY;  Surgeon: Lucilla Lame, MD;  Location: [facility];  Service: Endoscopy;  Laterality: N/A;  sleep apnea   LAPAROSCOPIC PARTIAL COLECTOMY Right 11/17/2019   Procedure: LAPAROSCOPIC PARTIAL COLECTOMY RIGHT EXTENDED;  Surgeon: Olean Ree, MD;  Location: [facility];  Service: General;  Laterality: Right;   PORTACATH PLACEMENT N/A 12/28/2019   Procedure: INSERTION PORT-A-CATH Left subclavian;  Surgeon: Olean Ree, MD;  Location: [facility];  Service: General;  Laterality: N/A;    Prior to Admission medications   Medication Sig Start Date End Date Taking? Authorizing Provider  APPLE CIDER VINEGAR PO  Take 30-45 mLs by mouth every other day.    Yes [provider]  aspirin 325 MG EC tablet Take 325 mg by mouth every other day. In the morning   Yes [provider]  colchicine 0.6 MG tablet Take 0.6 mg by mouth 2 (two) times daily. 01/11/22  Yes [provider]  digoxin (LANOXIN) 0.25 MG tablet Take 0.25 mg by mouth daily.    Yes [provider]  DULoxetine (CYMBALTA) 20 MG capsule Take 1 capsule (20 mg total) by mouth daily. 08/21/22  Yes Lloyd Huger, MD  furosemide (LASIX) 80 MG tablet Take 80 mg by mouth daily. Morning & late afternoon   Yes [provider]  MAGNESIUM PO Take by mouth.   Yes [provider]  Misc Natural Products (TART CHERRY ADVANCED PO) Take 60 mLs by mouth every other day.   Yes [provider]  Multiple Vitamins-Minerals (ZINC PO) Take by mouth.   Yes [provider]  potassium bicarbonate (K-LYTE) 25 MEQ disintegrating tablet Take 25 mEq by mouth 4 (four) times a week. In the afternoon.   Yes [provider]  calcitRIOL (ROCALTROL) 0.25 MCG capsule Take by mouth. Patient not taking: Reported on 08/29/2022 09/05/21 09/05/22  [provider]    Allergies as of 08/21/2022   (No Known Allergies)    Family History  Problem Relation Age of Onset   Healthy Mother     Social History  Socioeconomic History   Marital status: Single    Spouse name: Not on file   Number of children: Not on file   Years of education: Not on file   Highest education level: Not on file  Occupational History   Not on file  Tobacco Use   Smoking status: Never   Smokeless tobacco: Never  Vaping Use   Vaping Use: Never used  Substance and Sexual Activity   Alcohol use: Not Currently   Drug use: Never   Sexual activity: Yes    Birth control/protection: None  Other Topics Concern   Not on file  Social History Narrative   Not on file   Social Determinants of Health   Financial Resource  Strain: Low Risk  (05/28/2019)   Overall Financial Resource Strain (CARDIA)    Difficulty of Paying Living Expenses: Not hard at all  Food Insecurity: No Food Insecurity (05/28/2019)   Hunger Vital Sign    Worried About Running Out of Food in the Last Year: Never true    [place] in the Last Year: Never true  Transportation Needs: No Transportation Needs (05/28/2019)   PRAPARE - Hydrologist (Medical): No    Lack of Transportation (Non-Medical): No  Physical Activity: Inactive (05/28/2019)   Exercise Vital Sign    Days of Exercise per Week: 0 days    Minutes of Exercise per Session: 0 min  Stress: Stress Concern Present (05/28/2019)   [place]    Feeling of Stress : To some extent  Social Connections: Moderately Integrated (05/28/2019)   Social Connection and Isolation Panel [NHANES]    Frequency of Communication with Friends and Family: More than three times a week    Frequency of Social Gatherings with Friends and Family: More than three times a week    Attends Religious Services: More than 4 times per year    Active Member of [employer] or Organizations: Yes    Attends Music therapist: More than 4 times per year    Marital Status: Divorced  Intimate Partner Violence: Not At Risk (05/28/2019)   Humiliation, Afraid, Rape, and Kick questionnaire    Fear of Current or Ex-Partner: No    Emotionally Abused: No    Physically Abused: No    Sexually Abused: No    Review of Systems: See HPI, otherwise negative ROS  Physical Exam: BP 136/62   Pulse 64   Temp 97.7 F (36.5 C) (Temporal)   Resp 20   Ht 5' 10.25" (1.784 m)   Wt 101.6 kg   SpO2 99%   BMI 31.91 kg/m  General:   Alert,  pleasant and cooperative in NAD Head:  Normocephalic and atraumatic. Neck:  Supple; no masses or thyromegaly. Lungs:  Clear throughout to auscultation.    Heart:  Regular rate and  rhythm. Abdomen:  Soft, nontender and nondistended. Normal bowel sounds, without guarding, and without rebound.   Neurologic:  Alert and  oriented x4;  grossly normal neurologically.  Impression/Plan: KUPONO MARLING is here for an colonoscopy to be performed for person history of colon cancer 2020  Risks, benefits, limitations, and alternatives regarding  colonoscopy have been reviewed with the patient.  Questions have been answered.  All parties agreeable.   Lucilla Lame, MD  09/03/2022, 7:10 AM

## 2022-09-03 NOTE — Op Note (Signed)
[facility] Gastroenterology Patient Name: Chris George Procedure Date: 09/03/2022 7:11 AM MRN: [id number] Account #: [id number] Date of Birth: 1956-05-20 Admit Type: Outpatient Age: 66 Room: [facility] OR ROOM 01 Gender: Male Note Status: Finalized Instrument Name: [id number] Procedure:             Colonoscopy Indications:           Screening for colorectal malignant neoplasm, High risk                         colon cancer surveillance: Personal history of colon                         cancer Providers:             Lucilla Lame MD, MD Referring MD:          Sofie Hartigan (Referring MD) Medicines:             Propofol per Anesthesia Complications:         No immediate complications. Procedure:             Pre-Anesthesia Assessment:                        - Prior to the procedure, a History and Physical was                         performed, and patient medications and allergies were                         reviewed. The patient's tolerance of previous                         anesthesia was also reviewed. The risks and benefits                         of the procedure and the sedation options and risks                         were discussed with the patient. All questions were                         answered, and informed consent was obtained. Prior                         Anticoagulants: The patient has taken no anticoagulant                         or antiplatelet agents. ASA Grade Assessment: II - A                         patient with mild systemic disease. After reviewing                         the risks and benefits, the patient was deemed in                         satisfactory condition to undergo the procedure.  After obtaining informed consent, the colonoscope was                         passed under direct vision. Throughout the procedure,                         the patient's blood pressure, pulse, and oxygen                          saturations were monitored continuously. The                         Colonoscope was introduced through the anus and                         advanced to the the ileocolonic anastomosis. The                         colonoscopy was performed without difficulty. The                         patient tolerated the procedure well. The quality of                         the bowel preparation was excellent. Findings:      The perianal and digital rectal examinations were normal.      There was evidence of a prior end-to-side colo-colonic anastomosis in       the transverse colon. This was patent and was characterized by healthy       appearing mucosa.      Non-bleeding internal hemorrhoids were found during retroflexion. The       hemorrhoids were Grade I (internal hemorrhoids that do not prolapse). Impression:            - Patent end-to-side colo-colonic anastomosis,                         characterized by healthy appearing mucosa.                        - Non-bleeding internal hemorrhoids.                        - No specimens collected. Recommendation:        - Discharge patient to home.                        - Resume previous diet.                        - Continue present medications.                        - Repeat colonoscopy in 3 years for surveillance. Procedure Code(s):     --- Professional ---                        [phone], Colonoscopy, flexible; diagnostic, including                         collection of specimen(s) by brushing or  washing, when                         performed (separate procedure) Diagnosis Code(s):     --- Professional ---                        Z12.11, Encounter for screening for malignant neoplasm                         of colon                        Z85.038, Personal history of other malignant neoplasm                         of large intestine CPT copyright 2022 American Medical Association. All rights reserved. The codes documented in this report are  preliminary and upon coder review may  be revised to meet current compliance requirements. Lucilla Lame MD, MD 09/03/2022 7:32:08 AM This report has been signed electronically. Number of Addenda: 0 Note Initiated On: 09/03/2022 7:11 AM Scope Withdrawal Time: 0 hours 3 minutes 26 seconds  Total Procedure Duration: 0 hours 4 minutes 37 seconds  Estimated Blood Loss:  Estimated blood loss: none.      [facility]

## 2022-09-03 NOTE — Anesthesia Postprocedure Evaluation (Signed)
Anesthesia Post Note  Patient: Chris George  Procedure(s) Performed: COLONOSCOPY WITH PROPOFOL  Patient location during evaluation: PACU Anesthesia Type: General Level of consciousness: awake and alert Pain management: pain level controlled Vital Signs Assessment: post-procedure vital signs reviewed and stable Respiratory status: spontaneous breathing, nonlabored ventilation, respiratory function stable and patient connected to nasal cannula oxygen Cardiovascular status: blood pressure returned to baseline and stable Postop Assessment: no apparent nausea or vomiting Anesthetic complications: no   There were no known notable events for this encounter.   Last Vitals:  Vitals:   09/03/22 0740 09/03/22 0745  BP:  105/73  Pulse: 61 (!) 58  Resp: 16 17  Temp:    SpO2: 99% 94%    Last Pain:  Vitals:   09/03/22 0733  TempSrc:   PainSc: 0-No pain                 Martha Clan

## 2022-09-03 NOTE — Anesthesia Preprocedure Evaluation (Signed)
Anesthesia Evaluation  Patient identified by MRN, date of birth, ID band Patient awake    Reviewed: Allergy & Precautions, H&P , NPO status , Patient's Chart, lab work & pertinent test results, reviewed documented beta blocker date and time   History of Anesthesia Complications Negative for: history of anesthetic complications  Airway Mallampati: II  TM Distance: >3 FB Neck ROM: full    Dental  (+) Teeth Intact, Dental Advidsory Given   Pulmonary neg pulmonary ROS, neg shortness of breath, sleep apnea , pneumonia, neg COPD, neg recent URI,    Pulmonary exam normal breath sounds clear to auscultation       Cardiovascular Exercise Tolerance: Good hypertension, On Medications (-) angina+CHF  (-) Past MI and (-) Cardiac Stents negative cardio ROS  + dysrhythmias Atrial Fibrillation (-) Valvular Problems/Murmurs Rhythm:regular Rate:Normal     Neuro/Psych PSYCHIATRIC DISORDERS Depression negative neurological ROS  negative psych ROS   GI/Hepatic negative GI ROS, Neg liver ROS, PUD,   Endo/Other  diabetes  Renal/GU      Musculoskeletal   Abdominal   Peds  Hematology negative hematology ROS (+) Blood dyscrasia, anemia ,   Anesthesia Other Findings Past Medical History: No date: A-fib ([place]) No date: Anemia No date: Arthritis     Comment:  ankles No date: Cancer [facility])     Comment:  multiple myeloma 10/26/2019: 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[place]) No date: Cardiomyopathy ([place]) No date: Chemotherapy-induced peripheral neuropathy (HCC) No date: CHF (congestive heart failure) (HCC) No date: Depression No date: Dysrhythmia     Comment:  atrial fib No date: HLD (hyperlipidemia) No date: Hypercholesteremia No date: Hypertension No date: Moderate tricuspid insufficiency No date: Multiple myeloma (HCC)     Comment:  not being treated right now per pt 11/11/19 No date: Pneumonia No date: Sleep apnea     Comment:  No CPAP.   OSA resolved with wt loss.   Reproductive/Obstetrics negative OB ROS                             Anesthesia Physical  Anesthesia Plan  ASA: III  Anesthesia Plan: General   Post-op Pain Management:    Induction: Intravenous  PONV Risk Score and Plan: Propofol infusion and TIVA  Airway Management Planned: Natural Airway and Nasal Cannula  Additional Equipment:   Intra-op Plan:   Post-operative Plan:   Informed Consent: I have reviewed the patients History and Physical, chart, labs and discussed the procedure including the risks, benefits and alternatives for the proposed anesthesia with the patient or authorized representative who has indicated his/her understanding and acceptance.       Plan Discussed with: CRNA  Anesthesia Plan Comments:         Anesthesia Quick Evaluation

## 2022-09-03 NOTE — Transfer of Care (Signed)
Immediate Anesthesia Transfer of Care Note  Patient: Chris George  Procedure(s) Performed: COLONOSCOPY WITH PROPOFOL  Patient Location: PACU  Anesthesia Type: No value filed.  Level of Consciousness: awake, alert  and patient cooperative  Airway and Oxygen Therapy: Patient Spontanous Breathing and Patient connected to supplemental oxygen  Post-op Assessment: Post-op Vital signs reviewed, Patient's Cardiovascular Status Stable, Respiratory Function Stable, Patent Airway and No signs of Nausea or vomiting  Post-op Vital Signs: Reviewed and stable  Complications: There were no known notable events for this encounter.

## 2022-09-04 ENCOUNTER — Encounter: Payer: Self-pay | Admitting: Gastroenterology

## 2022-09-05 ENCOUNTER — Inpatient Hospital Stay: Payer: Medicare HMO | Attending: Oncology | Admitting: Occupational Therapy

## 2022-10-09 ENCOUNTER — Encounter: Payer: Self-pay | Admitting: Emergency Medicine

## 2022-10-09 ENCOUNTER — Ambulatory Visit (INDEPENDENT_AMBULATORY_CARE_PROVIDER_SITE_OTHER): Payer: Medicare HMO

## 2022-10-09 ENCOUNTER — Ambulatory Visit
Admission: EM | Admit: 2022-10-09 | Discharge: 2022-10-09 | Disposition: A | Discharge status: Home / Self Care | Payer: Medicare HMO | Attending: Family Medicine | Admitting: Family Medicine

## 2022-10-09 DIAGNOSIS — Z1152 Encounter for screening for COVID-19: Secondary | ICD-10-CM | POA: Insufficient documentation

## 2022-10-09 DIAGNOSIS — J029 Acute pharyngitis, unspecified: Secondary | ICD-10-CM | POA: Insufficient documentation

## 2022-10-09 DIAGNOSIS — R059 Cough, unspecified: Secondary | ICD-10-CM

## 2022-10-09 DIAGNOSIS — R051 Acute cough: Secondary | ICD-10-CM | POA: Insufficient documentation

## 2022-10-09 DIAGNOSIS — M1009 Idiopathic gout, multiple sites: Secondary | ICD-10-CM | POA: Insufficient documentation

## 2022-10-09 DIAGNOSIS — M25521 Pain in right elbow: Secondary | ICD-10-CM

## 2022-10-09 DIAGNOSIS — R062 Wheezing: Secondary | ICD-10-CM

## 2022-10-09 DIAGNOSIS — M109 Gout, unspecified: Secondary | ICD-10-CM

## 2022-10-09 LAB — CBC WITH DIFFERENTIAL/PLATELET
Abs Immature Granulocytes: 0.02 10*3/uL (ref 0.00–0.07)
Basophils Absolute: 0 10*3/uL (ref 0.0–0.1)
Basophils Relative: 0 %
Eosinophils Absolute: 0 10*3/uL (ref 0.0–0.5)
Eosinophils Relative: 1 %
HCT: 31.6 % — ABNORMAL LOW (ref 39.0–52.0)
Hemoglobin: 10 g/dL — ABNORMAL LOW (ref 13.0–17.0)
Immature Granulocytes: 0 %
Lymphocytes Relative: 6 %
Lymphs Abs: 0.3 10*3/uL — ABNORMAL LOW (ref 0.7–4.0)
MCH: 27.7 pg (ref 26.0–34.0)
MCHC: 31.6 g/dL (ref 30.0–36.0)
MCV: 87.5 fL (ref 80.0–100.0)
Monocytes Absolute: 0.7 10*3/uL (ref 0.1–1.0)
Monocytes Relative: 15 %
Neutro Abs: 3.8 10*3/uL (ref 1.7–7.7)
Neutrophils Relative %: 78 %
Platelets: 127 10*3/uL — ABNORMAL LOW (ref 150–400)
RBC: 3.61 MIL/uL — ABNORMAL LOW (ref 4.22–5.81)
RDW: 14.3 % (ref 11.5–15.5)
WBC: 4.8 10*3/uL (ref 4.0–10.5)
nRBC: 0 % (ref 0.0–0.2)

## 2022-10-09 LAB — RESP PANEL BY RT-PCR (RSV, FLU A&B, COVID)  RVPGX2
Influenza A by PCR: NEGATIVE
Influenza B by PCR: NEGATIVE
Resp Syncytial Virus by PCR: NEGATIVE
SARS Coronavirus 2 by RT PCR: NEGATIVE

## 2022-10-09 LAB — URIC ACID: Uric Acid, Serum: 11.4 mg/dL — ABNORMAL HIGH (ref 3.7–8.6)

## 2022-10-09 MED ORDER — COLCHICINE 0.6 MG PO TABS: 0.6000 mg | ORAL_TABLET | Freq: Every day | ORAL | 0 refills | Status: DC

## 2022-10-09 MED ORDER — PREDNISONE 50 MG PO TABS: 50.0000 mg | ORAL_TABLET | Freq: Every day | ORAL | 0 refills | Status: AC

## 2022-10-09 MED ORDER — DEXAMETHASONE SODIUM PHOSPHATE 10 MG/ML IJ SOLN
10.0000 mg | Freq: Once | INTRAMUSCULAR | Status: AC
  Administered 2022-10-09: 10 mg via INTRAMUSCULAR

## 2022-10-09 NOTE — Discharge Instructions (Addendum)
You are having a gout flare.  We will treat this with steroids and colchicine.  You may want to start steroids in the morning as they may keep you well.  For the colchicine take 2 tablets tonight and then 1 hour later take another tablet.  The next day take 1 tablet daily.  Your x-rays did not show a fracture or dislocation.  You have a lot of swelling in your elbow and wrist.   Your COVID, influenza and RSV tests were negative. Your chest xray did not show any pneumonia, fluid buildup or rib fracture.

## 2022-10-09 NOTE — ED Triage Notes (Signed)
Pt c/o right elbow and right wrist pain. Started about 4 days ago. He states he has h/o gout and it feels like gout. He also c/o cough and congestion. Started about 4 days ago as well. Denies fever.

## 2022-10-09 NOTE — ED Provider Notes (Signed)
[facility]    CSN: [id number] Arrival date & time: 10/09/22  1655      History   Chief Complaint Chief Complaint  Patient presents with   Wrist Pain    right   Elbow Pain    right   Cough    HPI  HPI Chris [facility] is a 66 y.o. male.   Chris [facility] presents for right wrist and hand pain for about 2-4 days. No recent trauma, fall or injury.  Has trouble making a fist and straightening his fingers and arm due to his pain and swelling. Has history of neuropathy after radiation/chemotherapy (stage 3 colon cancer diagnosed 3 years ago and multiple myeloma).  Not undergoing radiation or chemotherapy now. Has history of gout.  States that he has not had a strict diet recently.  Thinks that he may have overdone it and cause a gout flare.  He is right-handed.  Has had a cough for past 3-4 days. Has congestion and sore throat. No fever, vomiting, diarrhea.Took some ibuprofen and cough medicine that helped. No history of asthma or tobacco smoking.  Request COVID testing.  Notes that he did miss a couple days of his Lasix.  I feels like he may be about 10 pounds overweight.     Past Medical History:  Diagnosis Date   A-fib ([place])    Anemia    Arthritis    ankles   Cancer ([place])    multiple myeloma   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[place]) 10/26/2019   Cardiomyopathy ([place])    Chemotherapy-induced peripheral neuropathy (HCC)    CHF (congestive heart failure) (HCC)    Depression    Dysrhythmia    atrial fib   HLD (hyperlipidemia)    Hypercholesteremia    Hypertension    Moderate tricuspid insufficiency    Multiple myeloma (HCC)    not being treated right now per pt 11/11/19   Pneumonia    Sleep apnea    No CPAP.  OSA resolved with wt loss.    Patient Active Problem List   Diagnosis Date Noted   Special screening for malignant neoplasms, colon    Personal history of colon cancer    Chemotherapy-induced peripheral neuropathy ([place]) 02/16/2022   Difficulty walking 08/30/2020    Port-A-Cath in place    Abnormal ECG 11/04/2019   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[place]) 10/27/2019   Neoplasm of digestive system    Intraoperative partial intestinal obstruction (HCC)    Acute esophagogastric ulcer    Goals of care, counseling/discussion 05/18/2019   Multiple myeloma ([place]) 05/17/2019   Anemia 05/12/2019   Arthritis of left ankle 02/05/2018   Cardiomyopathy, idiopathic ([place]) 01/07/2018   Enlarged heart 01/01/2017   Moderate tricuspid insufficiency 01/01/2017   OSA (obstructive sleep apnea) 01/01/2017   Chronic a-fib ([place]) 12/06/2015   Pure hypercholesterolemia 02/09/2015   Type 2 diabetes mellitus without complication ([place]) 02/58/5277   Anticoagulant long-term use 04/29/2014   Hyperlipidemia 04/29/2014   Hypertension, essential, benign 04/26/2014    Past Surgical History:  Procedure Laterality Date   ATRIAL ABLATION SURGERY     CARDIAC SURGERY     A-Fib Ablations   COLONOSCOPY WITH PROPOFOL N/A 10/26/2019   Procedure: COLONOSCOPY WITH BIOPSY;  Surgeon: Lucilla Lame, MD;  Location: [place];  Service: Endoscopy;  Laterality: N/A;   COLONOSCOPY WITH PROPOFOL N/A 09/03/2022   Procedure: COLONOSCOPY WITH PROPOFOL;  Surgeon: Lucilla Lame, MD;  Location: [place];  Service: Endoscopy;  Laterality: N/A;  ESOPHAGOGASTRODUODENOSCOPY (EGD) WITH PROPOFOL N/A 10/26/2019   Procedure: ESOPHAGOGASTRODUODENOSCOPY (EGD) WITH BIOPSY;  Surgeon: Lucilla Lame, MD;  Location: [place];  Service: Endoscopy;  Laterality: N/A;  sleep apnea   LAPAROSCOPIC PARTIAL COLECTOMY Right 11/17/2019   Procedure: LAPAROSCOPIC PARTIAL COLECTOMY RIGHT EXTENDED;  Surgeon: Olean Ree, MD;  Location: [facility];  Service: General;  Laterality: Right;   PORTACATH PLACEMENT N/A 12/28/2019   Procedure: INSERTION PORT-A-CATH Left subclavian;  Surgeon: Olean Ree, MD;  Location: [facility];  Service: General;  Laterality: N/A;       Home Medications    Prior to Admission  medications   Medication Sig Start Date End Date Taking? Authorizing Provider  APPLE CIDER VINEGAR PO Take 30-45 mLs by mouth every other day.    Yes [provider]  aspirin 325 MG EC tablet Take 325 mg by mouth every other day. In the morning   Yes [provider]  colchicine 0.6 MG tablet Take 1 tablet (0.6 mg total) by mouth daily. 10/09/22  Yes Trason Shifflet, DO  digoxin (LANOXIN) 0.25 MG tablet Take 0.25 mg by mouth daily.    Yes [provider]  furosemide (LASIX) 80 MG tablet Take 80 mg by mouth daily. Morning & late afternoon   Yes [provider]  MAGNESIUM PO Take by mouth.   Yes [provider]  Multiple Vitamins-Minerals (ZINC PO) Take by mouth.   Yes [provider]  potassium bicarbonate (K-LYTE) 25 MEQ disintegrating tablet Take 25 mEq by mouth 4 (four) times a week. In the afternoon.   Yes [provider]  predniSONE (DELTASONE) 50 MG tablet Take 1 tablet (50 mg total) by mouth daily for 3 days. 10/09/22 10/12/22 Yes Carnita Golob, DO  Zinc Sulfate (ZINC 15 PO) Take by mouth.   Yes [provider]  DULoxetine (CYMBALTA) 20 MG capsule Take 1 capsule (20 mg total) by mouth daily. 08/21/22   Lloyd Huger, MD  Misc Natural Products (TART CHERRY ADVANCED PO) Take 60 mLs by mouth every other day.    [provider]    Family History Family History  Problem Relation Age of Onset   Healthy Mother     Social History Social History   Tobacco Use   Smoking status: Never   Smokeless tobacco: Never  Vaping Use   Vaping Use: Never used  Substance Use Topics   Alcohol use: Not Currently   Drug use: Never     Allergies   Patient has no known allergies.   Review of Systems Review of Systems: :negative unless otherwise stated in HPI.      Physical Exam Triage Vital Signs ED Triage Vitals  Enc Vitals Group     BP 10/09/22 1825 137/74     Pulse Rate 10/09/22 1825 (!) 55     Resp  10/09/22 1825 18     Temp 10/09/22 1825 99.3 F (37.4 C)     Temp Source 10/09/22 1825 Oral     SpO2 10/09/22 1825 94 %     Weight 10/09/22 1823 223 lb 15.8 oz (101.6 kg)     Height 10/09/22 1823 5' 10.25" (1.784 m)     Head Circumference --      Peak Flow --      Pain Score 10/09/22 1821 8     Pain Loc --      Pain Edu? --      Excl. in [place]? --    No data found.  Updated  Vital Signs BP 137/74 (BP Location: Left Arm)   Pulse (!) 55   Temp 99.3 F (37.4 C) (Oral)   Resp 18   Ht 5' 10.25" (1.784 m)   Wt 101.6 kg   SpO2 94%   BMI 31.91 kg/m   Visual Acuity Right Eye Distance:   Left Eye Distance:   Bilateral Distance:    Right Eye Near:   Left Eye Near:    Bilateral Near:     Physical Exam GEN: well appearing male in no acute distress  CVS: well perfused, regular rate, irregularly irregular rhythm, left chest tunneled catheter RESP: speaking in full sentences without pause, no respiratory distress, expiratory wheezing diffusely MSK:  Right elbow and wrist: + edema, + erythema + TTP along posterior arm, unable to fully extend ROM and strength testing limited  2/2 to pain and edema.  Sensation intact. Peripheral pulses intact. SKIN: redness and warmth across wrist and elbow  UC Treatments / Results  Labs (all labs ordered are listed, but only abnormal results are displayed) Labs Reviewed  CBC WITH DIFFERENTIAL/PLATELET - Abnormal; Notable for the following components:      Result Value   RBC 3.61 (*)    Hemoglobin 10.0 (*)    HCT 31.6 (*)    Platelets 127 (*)    Lymphs Abs 0.3 (*)    All other components within normal limits  URIC ACID - Abnormal; Notable for the following components:   Uric Acid, Serum 11.4 (*)    All other components within normal limits  RESP PANEL BY RT-PCR (RSV, FLU A&B, COVID)  [id number]    EKG   Radiology DG Chest 2 View  Result Date: 10/09/2022 CLINICAL DATA:  Cough and wheezing. History of colon cancer. EXAM: CHEST - 2 VIEW  COMPARISON:  Chest CT 08/16/2022, radiograph 12/28/2019 FINDINGS: Left chest port with tip in the upper SVC. Similar cardiomegaly. Stable mediastinal contours. No focal airspace disease. Trace fluid in the fissures without significant sub pulmonic effusion. No evidence of pulmonary nodule. No pulmonary edema. No pneumothorax. On limited assessment, no acute osseous findings. IMPRESSION: 1. Stable cardiomegaly. 2. Trace fluid in the fissures without significant sub pulmonic effusion. 3. No focal airspace disease. Electronically Signed   By: Keith Rake M.D.   On: 10/09/2022 19:06   DG Elbow Complete Right  Result Date: 10/09/2022 CLINICAL DATA:  Right elbow pain for 4 days. History of gout. EXAM: RIGHT ELBOW - COMPLETE 3+ VIEW COMPARISON:  None Available. FINDINGS: No fracture. Normal alignment without dislocation. Joint spaces are preserved with slight degenerative spurring. No erosion or bone destruction. No periostitis. There is no elbow joint effusion. Soft tissue edema is most prominent posterior and medially. No soft tissue gas or radiopaque foreign body. IMPRESSION: 1. Soft tissue edema most prominent posterior and medial. Elbow joint effusion. 2. No acute osseous abnormality. Electronically Signed   By: Keith Rake M.D.   On: 10/09/2022 19:02    Procedures Procedures (including critical care time)  Medications Ordered in UC Medications  dexamethasone (DECADRON) injection 10 mg (10 mg Intramuscular Given 10/09/22 1948)    Initial Impression / Assessment and Plan / UC Course  I have reviewed the triage vital signs and the nursing notes.  Pertinent labs & imaging results that were available during my care of the patient were reviewed by me and considered in my medical decision making (see chart for details).      Pt is a 66 y.o.  male with 4 days  of right elbow and wrist pain, redness and edema. On exam he is unable to completely straighten his right elbow.  Obtained right elbow  x-rays.  Personally reviewed by me were unremarkable for fracture or dislocation. Radiologist notes left elbow soft tissue swelling that is most prominent posteriorly and medially.  The elbow has a joint effusion.  Uric acid level was elevated.  I suspect he is having a gout flare.  Dexamethasone 10 mg given tonight as his pharmacy is closed.  Prescription for steroids and colchicine sent to his pharmacy.  He has diffuse expiratory wheezing.  He is satting between 93 to 96% on room air.  He has had cough for 3-4 days. COVID, influenza and RSV obtained and were negative.  Chest x-ray obtained.  Chest x-ray showing stable cardiomegaly and radiologist notes trace fluid in the fissures without significant subpulmonic effusion.  There was no pneumonia seen.    Patient to gradually return to normal activities, as tolerated and continue ordinary activities within the limits permitted by pain.  Counseled patient on red flag symptoms and when to seek immediate care. Patient to follow up with orthopedic provider.  Return and ED precautions given. Understanding voiced. Discussed MDM, treatment plan and plan for follow-up with patient who agrees with plan.   Final Clinical Impressions(s) / UC Diagnoses   Final diagnoses:  Acute gout of multiple sites, unspecified cause  Acute cough     Discharge Instructions      You are having a gout flare.  We will treat this with steroids and colchicine.  You may want to start steroids in the morning as they may keep you well.  For the colchicine take 2 tablets tonight and then 1 hour later take another tablet.  The next day take 1 tablet daily.  Your x-rays did not show a fracture or dislocation.  You have a lot of swelling in your elbow and wrist.   Your COVID, influenza and RSV tests were negative. Your chest xray did not show any pneumonia, fluid buildup or rib fracture.     ED Prescriptions     Medication Sig Dispense Auth. Provider   predniSONE (DELTASONE)  50 MG tablet Take 1 tablet (50 mg total) by mouth daily for 3 days. 3 tablet Albino Bufford, DO   colchicine 0.6 MG tablet Take 1 tablet (0.6 mg total) by mouth daily. 8 tablet Lyndee Hensen, DO      PDMP not reviewed this encounter.   Lyndee Hensen, DO 10/09/22 2118

## 2022-10-10 ENCOUNTER — Inpatient Hospital Stay: Payer: Medicare HMO | Attending: Oncology | Admitting: Occupational Therapy

## 2022-10-10 ENCOUNTER — Other Ambulatory Visit: Payer: Self-pay

## 2022-10-10 ENCOUNTER — Inpatient Hospital Stay: Payer: Medicare HMO

## 2022-10-10 DIAGNOSIS — C184 Malignant neoplasm of transverse colon: Secondary | ICD-10-CM

## 2022-10-10 DIAGNOSIS — C9000 Multiple myeloma not having achieved remission: Secondary | ICD-10-CM

## 2022-10-10 DIAGNOSIS — C182 Malignant neoplasm of ascending colon: Secondary | ICD-10-CM | POA: Insufficient documentation

## 2022-10-10 DIAGNOSIS — Z452 Encounter for adjustment and management of vascular access device: Secondary | ICD-10-CM | POA: Diagnosis not present

## 2022-10-10 DIAGNOSIS — G620 Drug-induced polyneuropathy: Secondary | ICD-10-CM

## 2022-10-10 DIAGNOSIS — Z95828 Presence of other vascular implants and grafts: Secondary | ICD-10-CM

## 2022-10-10 MED ORDER — HEPARIN SOD (PORK) LOCK FLUSH 100 UNIT/ML IV SOLN
500.0000 [IU] | Freq: Once | INTRAVENOUS | Status: AC
  Administered 2022-10-10: 500 [IU] via INTRAVENOUS
  Filled 2022-10-10: qty 5

## 2022-10-10 MED ORDER — SODIUM CHLORIDE 0.9% FLUSH
10.0000 mL | Freq: Once | INTRAVENOUS | Status: AC
  Administered 2022-10-10: 10 mL via INTRAVENOUS
  Filled 2022-10-10: qty 10

## 2022-10-10 NOTE — Therapy (Signed)
Chris [facility], [place], [name], [address] Phone: [phone]   Fax:  [phone]  Occupational Therapy Screen:  Patient Details  Name: Chris George MRN: [id number] Date of Birth: 04-02-1956 No data recorded  Encounter Date: 10/10/2022   OT End of Session - 10/10/22 1908     Visit Number 0             Past Medical History:  Diagnosis Date   A-fib [facility])    Anemia    Arthritis    ankles   Cancer ([place])    multiple myeloma   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[place]) 10/26/2019   Cardiomyopathy ([place])    Chemotherapy-induced peripheral neuropathy (HCC)    CHF (congestive heart failure) ([place])    Depression    Dysrhythmia    atrial fib   HLD (hyperlipidemia)    Hypercholesteremia    Hypertension    Moderate tricuspid insufficiency    Multiple myeloma ([place] [facility])    not being treated right now per pt 11/11/19   Pneumonia    Sleep apnea    No CPAP.  OSA resolved with wt loss.    Past Surgical History:  Procedure Laterality Date   ATRIAL ABLATION SURGERY     CARDIAC SURGERY     A-Fib Ablations   COLONOSCOPY WITH PROPOFOL N/A 10/26/2019   Procedure: COLONOSCOPY WITH BIOPSY;  Surgeon: Lucilla Lame, MD;  Location: [facility];  Service: Endoscopy;  Laterality: N/A;   COLONOSCOPY WITH PROPOFOL N/A 09/03/2022   Procedure: COLONOSCOPY WITH PROPOFOL;  Surgeon: Lucilla Lame, MD;  Location: [facility];  Service: Endoscopy;  Laterality: N/A;   ESOPHAGOGASTRODUODENOSCOPY (EGD) WITH PROPOFOL N/A 10/26/2019   Procedure: ESOPHAGOGASTRODUODENOSCOPY (EGD) WITH BIOPSY;  Surgeon: Lucilla Lame, MD;  Location: [facility];  Service: Endoscopy;  Laterality: N/A;  sleep apnea   LAPAROSCOPIC PARTIAL COLECTOMY Right 11/17/2019   Procedure: LAPAROSCOPIC PARTIAL COLECTOMY RIGHT EXTENDED;  Surgeon: Olean Ree, MD;  Location: [facility];  Service: General;  Laterality: Right;   PORTACATH PLACEMENT N/A 12/28/2019    Procedure: INSERTION PORT-A-CATH Left subclavian;  Surgeon: Olean Ree, MD;  Location: [facility];  Service: General;  Laterality: N/A;    There were no vitals filed for this visit.   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  I just do not have the self-confidence anymore because of the neuropathy in my feet.  I was at the urgent care with gout in my right elbow and wrist yesterday and they gave me a shot.  I did had some issues with gout in my legs the past.    Currently in Pain? No/denies                         Chris Huger, MD   08/21/2022 12:08 PM ASSESSMENT: Stage IIIc colon cancer, smoldering myeloma.   PLAN:     1.  Stage IIIc colon cancer: Patient completed cycle 9 of adjuvant FOLFOX on June 03, 2020.  Given his difficulties with treatment and declining performance status, treatment was discontinued altogether.  Patient's last colonoscopy was on October 26, 2019.  He has not had a repeat procedure since completion of treatment.  Repeat CT scan on August 16, 2022 reviewed independently and reported as above  with no obvious evidence of recurrent or progressive disease.  Despite this, patient's CEA has trended up to 18.3.  Patient was given a referral back to GI for repeat colonoscopy.  Return to clinic in 3 months for repeat laboratory work and further evaluation.   2. Smoldering myeloma: Patient was initially diagnosed and treated in [place], [place] and treated with single agent Velcade in approximately 2013.  He was evaluated in clinic in July 2014 when he declined any maintenance treatment or referral for bone marrow transplant.  He was subsequently lost to follow-up.  His M spike has ranged from 1.1-2.0 since July 2020.  Today's result is 1.8.  Over the same timeframe his IgG component has ranged from [id number].  His most recent  result is 2785.  Finally, his kappa free light chains have ranged from 48.1-75.3 with his most recent result at 49.1.  Metastatic bone survey on May 20, 2019 reviewed independently with no obvious bony lesions.  CT scan results as above.  His most recent bone marrow biopsy completed on May 28, 2019 revealed only 10 to 15% plasma cells with no clonality reported.  Cytogenetics were also reported as normal.  Continue to monitor closely.  Repeat laboratory work in 3 months as above.   3.  Anemia: Likely multifactorial, including renal insufficiency.  Hemoglobin improved to 10.9.  He last received IV Feraheme on October 06, 2019.   4.  Renal insufficiency: Creatinine has improved to 1.77.  Continue follow-up with nephrology as indicated.   5.  Peripheral neuropathy: Patient does not describe much numbness and tingling, but rather joint pain similar to his gout pain.  Gabapentin was previously discontinued.  Patient was given a referral to Occupational Therapy as well as initiated on Cymbalta.   6.  Leukopenia: Chronic and unchanged.. 7. Thrombocytopenia: Chronic and unchanged.  Patient's platelet count is 105.   8.  Elevated PSA: Patient PSA continues to increase and is now 24.44.  He was given a referral to urology for further evaluation. 9.  Depressive-like symptoms: Cymbalta 20 mg daily as above.  Return to clinic in 3 months as above.   Patient expressed understanding and was in agreement with this plan. He also understands that He can call clinic at any time with any questions, concerns, or complaints.       OT SCREEN 10/10/22: Patient presented at OT screen with a diagnosis of chemo-induced neuropathy in bilateral feet and hands.  Patient had a gout episode yesterday for right elbow and wrist and hand that he was seen by ED.  Patient received a shot and started medication. Patient report up to about 2 years ago he was playing competitive tennis.  Since then stopped because of  self-confidence. Patient denies any falls. 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  Do not have to self-confidence because of the neuropathy in the legs. Patient feels like because of neuropathy bilateral lower extremities, ankles got weak. Recommended for patient to start tai chi [name].  But also recommended strongly for patient to do a few sessions of physical therapy to increase flexibility, strength, decrease fall risk and ability to be able to maybe get back into tennis.  Dr. Grayland Ormond agreed for PT eval patient wants to go to the [facility] clinic.  Provided patient with home program for right elbow ,wrist and hand post gout diagnosis. Patient can  do contrast 2 times a day.  Followed by gentle active range of motion for elbow flexion ,extension.  Also flexion ,extension and ulnar /radial deviation for right wrist pain-free. Tendon glides 10 reps pain-free.                            Visit Diagnosis: Chemotherapy-induced peripheral neuropathy [facility])    Problem List Patient Active Problem List   Diagnosis Date Noted   Special screening for malignant neoplasms, colon    Personal history of colon cancer    Chemotherapy-induced peripheral neuropathy ([place]) 02/16/2022   Difficulty walking 08/30/2020   Port-A-Cath in place    Abnormal ECG 11/04/2019   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[place]) 10/27/2019   Neoplasm of digestive system    Intraoperative partial intestinal obstruction ([place])    Acute esophagogastric ulcer    Goals of care, counseling/discussion 05/18/2019   Multiple myeloma ([place]) 05/17/2019   Anemia 05/12/2019   Arthritis of left ankle 02/05/2018   Cardiomyopathy, idiopathic ([place]) 01/07/2018   Enlarged heart 01/01/2017   Moderate tricuspid insufficiency 01/01/2017   OSA (obstructive sleep apnea) 01/01/2017   Chronic a-fib ([place]) 12/06/2015    Pure hypercholesterolemia 02/09/2015   Type 2 diabetes mellitus without complication ([place]) 29/19/1660   Anticoagulant long-term use 04/29/2014   Hyperlipidemia 04/29/2014   Hypertension, essential, benign 04/26/2014    Rosalyn Gess, OTR/L,CLT 10/10/2022, 7:08 PM  [place] [facility] [address] [place], [name], [address] Phone: [phone]   Fax:  [phone]  Name: Chris [facility] MRN: [id number] Date of Birth: [birth year]

## 2022-10-26 ENCOUNTER — Encounter: Payer: Self-pay | Admitting: Emergency Medicine

## 2022-10-26 ENCOUNTER — Ambulatory Visit
Admission: EM | Admit: 2022-10-26 | Discharge: 2022-10-26 | Disposition: A | Discharge status: Home / Self Care | Payer: Medicare HMO | Attending: Family Medicine | Admitting: Family Medicine

## 2022-10-26 DIAGNOSIS — M1A09X0 Idiopathic chronic gout, multiple sites, without tophus (tophi): Secondary | ICD-10-CM

## 2022-10-26 MED ORDER — PREDNISONE 50 MG PO TABS: 50.0000 mg | ORAL_TABLET | Freq: Every day | ORAL | 0 refills | Status: AC

## 2022-10-26 MED ORDER — DEXAMETHASONE SODIUM PHOSPHATE 10 MG/ML IJ SOLN
10.0000 mg | Freq: Once | INTRAMUSCULAR | Status: AC
  Administered 2022-10-26: 10 mg via INTRAMUSCULAR

## 2022-10-26 MED ORDER — COLCHICINE 0.6 MG PO TABS: 0.6000 mg | ORAL_TABLET | Freq: Every day | ORAL | 0 refills | Status: DC

## 2022-10-26 MED ORDER — CEFDINIR 300 MG PO CAPS: 300.0000 mg | ORAL_CAPSULE | Freq: Two times a day (BID) | ORAL | 0 refills | Status: AC

## 2022-10-26 NOTE — ED Triage Notes (Signed)
Patient report right ankle pain that started yesterday.  Patient also reports ongoing pain in his right wrist and elbow.  Patient previously treated for gout at his last visit.  Patient denies injury or fall.

## 2022-10-26 NOTE — ED Provider Notes (Signed)
[facility]    CSN: [id number] Arrival date & time: 10/26/22  1722      History   Chief Complaint Chief Complaint  Patient presents with   Ankle Pain    right    HPI  HPI Chris George is a 66 y.o. male.   [name] presents for possible gout flare.  Had similar pain 3 weeks ago and told he had gout.  His right elbow and wrist.  Has pain with straightening his arm and turning his wrist. His right ankle ankle is starting to flare up.   His first flare of gout was about 4 years ago. He drinks cherry juice extract every other day to keep it away the build up.  After he finished his 2nd chemo treatment his gout has been worse (about 2 years ago).  Tends to occur in his elbow and wrist. He is unsure why he keeps having flare up.  He cut back on red meat and stopped eating so much bread. He wishes he could figure out why it keeps happening. Says its depressing.   He is still not sleeping and has a cough keeping him up at nigh. Takes cough medicine. COVID test was negative. The cough varies in intensity for the past 3 weeks. No fever. At times can feel short of breathe.     Past Medical History:  Diagnosis Date   A-fib ([place])    Anemia    Arthritis    ankles   Cancer ([place])    multiple myeloma   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[place]) 10/26/2019   Cardiomyopathy ([place])    Chemotherapy-induced peripheral neuropathy (HCC)    CHF (congestive heart failure) (HCC)    Depression    Dysrhythmia    atrial fib   HLD (hyperlipidemia)    Hypercholesteremia    Hypertension    Moderate tricuspid insufficiency    Multiple myeloma (HCC)    not being treated right now per pt 11/11/19   Pneumonia    Sleep apnea    No CPAP.  OSA resolved with wt loss.    Patient Active Problem List   Diagnosis Date Noted   Special screening for malignant neoplasms, colon    Personal history of colon cancer    Chemotherapy-induced peripheral neuropathy ([place]) 02/16/2022   Difficulty walking 08/30/2020    Port-A-Cath in place    Abnormal ECG 11/04/2019   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[employer]) 10/27/2019   Neoplasm of digestive system    Intraoperative partial intestinal obstruction (HCC)    Acute esophagogastric ulcer    Goals of care, counseling/discussion 05/18/2019   Multiple myeloma ([place]) 05/17/2019   Anemia 05/12/2019   Arthritis of left ankle 02/05/2018   Cardiomyopathy, idiopathic ([place]) 01/07/2018   Enlarged heart 01/01/2017   Moderate tricuspid insufficiency 01/01/2017   OSA (obstructive sleep apnea) 01/01/2017   Chronic a-fib ([place]) 12/06/2015   Pure hypercholesterolemia 02/09/2015   Type 2 diabetes mellitus without complication ([place]) 94/70/9628   Anticoagulant long-term use 04/29/2014   Hyperlipidemia 04/29/2014   Hypertension, essential, benign 04/26/2014    Past Surgical History:  Procedure Laterality Date   ATRIAL ABLATION SURGERY     CARDIAC SURGERY     A-Fib Ablations   COLONOSCOPY WITH PROPOFOL N/A 10/26/2019   Procedure: COLONOSCOPY WITH BIOPSY;  Surgeon: Lucilla Lame, MD;  Location: [place];  Service: Endoscopy;  Laterality: N/A;   COLONOSCOPY WITH PROPOFOL N/A 09/03/2022   Procedure: COLONOSCOPY WITH PROPOFOL;  Surgeon: Lucilla Lame, MD;  Location: [place];  Service: Endoscopy;  Laterality: N/A;   ESOPHAGOGASTRODUODENOSCOPY (EGD) WITH PROPOFOL N/A 10/26/2019   Procedure: ESOPHAGOGASTRODUODENOSCOPY (EGD) WITH BIOPSY;  Surgeon: Lucilla Lame, MD;  Location: [place];  Service: Endoscopy;  Laterality: N/A;  sleep apnea   LAPAROSCOPIC PARTIAL COLECTOMY Right 11/17/2019   Procedure: LAPAROSCOPIC PARTIAL COLECTOMY RIGHT EXTENDED;  Surgeon: Olean Ree, MD;  Location: [facility];  Service: General;  Laterality: Right;   PORTACATH PLACEMENT N/A 12/28/2019   Procedure: INSERTION PORT-A-CATH Left subclavian;  Surgeon: Olean Ree, MD;  Location: [facility];  Service: General;  Laterality: N/A;       Home Medications    Prior to Admission  medications   Medication Sig Start Date End Date Taking? Authorizing Provider  cefdinir (OMNICEF) 300 MG capsule Take 1 capsule (300 mg total) by mouth 2 (two) times daily for 5 days. 10/26/22 10/31/22 Yes Arjan Strohm, DO  predniSONE (DELTASONE) 50 MG tablet Take 1 tablet (50 mg total) by mouth daily for 5 days. 10/26/22 10/31/22 Yes Vicki Chaffin, DO  APPLE CIDER VINEGAR PO Take 30-45 mLs by mouth every other day.     [provider]  aspirin 325 MG EC tablet Take 325 mg by mouth every other day. In the morning    [provider]  colchicine 0.6 MG tablet Take 1 tablet (0.6 mg total) by mouth daily. 10/26/22   Lyndee Hensen, DO  digoxin (LANOXIN) 0.25 MG tablet Take 0.25 mg by mouth daily.     [provider]  DULoxetine (CYMBALTA) 20 MG capsule Take 1 capsule (20 mg total) by mouth daily. 08/21/22   Lloyd Huger, MD  furosemide (LASIX) 80 MG tablet Take 80 mg by mouth daily. Morning & late afternoon    [provider]  MAGNESIUM PO Take by mouth.    [provider]  Misc Natural Products (TART CHERRY ADVANCED PO) Take 60 mLs by mouth every other day.    [provider]  Multiple Vitamins-Minerals (ZINC PO) Take by mouth.    [provider]  potassium bicarbonate (K-LYTE) 25 MEQ disintegrating tablet Take 25 mEq by mouth 4 (four) times a week. In the afternoon.    [provider]  Zinc Sulfate (ZINC 15 PO) Take by mouth.    [provider]    Family History Family History  Problem Relation Age of Onset   Healthy Mother     Social History Social History   Tobacco Use   Smoking status: Never   Smokeless tobacco: Never  Vaping Use   Vaping Use: Never used  Substance Use Topics   Alcohol use: Not Currently   Drug use: Never     Allergies   Patient has no known allergies.   Review of Systems Review of Systems: :negative unless otherwise stated in HPI.      Physical Exam Triage  Vital Signs ED Triage Vitals  Enc Vitals Group     BP 10/26/22 1809 (!) 144/80     Pulse Rate 10/26/22 1809 69     Resp 10/26/22 1809 15     Temp 10/26/22 1809 97.8 F (36.6 C)     Temp Source 10/26/22 1809 Oral     SpO2 10/26/22 1809 98 %     Weight 10/26/22 1808 223 lb 15.8 oz (101.6 kg)     Height 10/26/22 1808 _0  (1.778 m)     Head Circumference --      Peak Flow --  Pain Score 10/26/22 1807 6     Pain Loc --      Pain Edu? --      Excl. in [place]? --    No data found.  Updated Vital Signs BP (!) 144/80 (BP Location: Left Arm)   Pulse 69   Temp 97.8 F (36.6 C) (Oral)   Resp 15   Ht _0  (1.778 m)   Wt 101.6 kg   SpO2 98%   BMI 32.14 kg/m   Visual Acuity Right Eye Distance:   Left Eye Distance:   Bilateral Distance:    Right Eye Near:   Left Eye Near:    Bilateral Near:     Physical Exam GEN: well appearing male in no acute distress  CVS: well perfused  RESP: speaking in full sentences without pause, no respiratory distress  MSK:   Right ankle:  Right ankle: Inspection:  mild erythema, edema and warmth without ecchymosis or bony deformity, pes planus Palpation: Tenderness to palpation lateral malleoli  ROM: Full active and passive range of motion Strength: 5/5 in all directions No ligamentous laxity No pain at the base of the fifth metatarsal Able to ambulate at least 4 steps without significant pain Special Tests: Negative anterior and posterior drawer  -Neurovascularly intact, no instability noted    UC Treatments / Results  Labs (all labs ordered are listed, but only abnormal results are displayed) Labs Reviewed - No data to display  EKG   Radiology No results found.  Procedures Procedures (including critical care time)  Medications Ordered in UC Medications  dexamethasone (DECADRON) injection 10 mg (10 mg Intramuscular Given 10/26/22 1852)    Initial Impression / Assessment and Plan / UC Course  I have reviewed the triage  vital signs and the nursing notes.  Pertinent labs & imaging results that were available during my care of the patient were reviewed by me and considered in my medical decision making (see chart for details).     Pt is a 66 y.o.  male with history of gout presents for 3 weeks of  right elbow and wrist pain with acute onset right ankle, redness and edema. On exam, he has mild erythema, edema with warmth with tenderness around his lateral malleolus on the right. Uric acid level was elevated (11.4) 3 weeks ago at [facility] visit.  I suspect he is having another gout flare.  Dexamethasone 10 mg given tonight as his pharmacy is closing soon.  Prescription for steroids and colchicine sent to his pharmacy. Discussed starting low-dose allopurinol (50 mg-renally dosed) and he advised speak to his PCP about this once his acute flare has cleared up.    He has ongoing cough for past 3 weeks.  He is satting between 98% on room air. COVID, influenza and RSV obtained and were negative at previous visit.  Chest x-ray at that time did not show a pneumonia.  CXR deferred as it will not currently change management.  Treat with steroids and prednisone as below.  Advised to monitor his BP and glucose while taking steroids.    Patient to gradually return to normal activities, as tolerated and continue ordinary activities within the limits permitted by pain.  Follow with PCP as scheduled.  Return and ED precautions given. Understanding voiced. Discussed MDM, treatment plan and plan for follow-up with patient who agrees with plan.   Final Clinical Impressions(s) / UC Diagnoses   Final diagnoses:  Chronic gout of multiple sites, unspecified cause  Discharge Instructions      You are having a gout flare.  We will treat this with steroids and colchicine.  You may want to start steroids in the morning as they may keep you well.  For the colchicine take 2 tablets tonight and then 1 hour later take another tablet.  The next day  take 1 tablet daily.  Take with your primary care doctor about starting low-dose allopurinol to prevent gout flares.        ED Prescriptions     Medication Sig Dispense Auth. Provider   colchicine 0.6 MG tablet Take 1 tablet (0.6 mg total) by mouth daily. 8 tablet Susi Goslin, DO   predniSONE (DELTASONE) 50 MG tablet Take 1 tablet (50 mg total) by mouth daily for 5 days. 5 tablet Geraldyne Barraclough, DO   cefdinir (OMNICEF) 300 MG capsule Take 1 capsule (300 mg total) by mouth 2 (two) times daily for 5 days. 10 capsule Lyndee Hensen, DO      PDMP not reviewed this encounter.   Lyndee Hensen, DO 10/26/22 2018

## 2022-10-26 NOTE — Discharge Instructions (Addendum)
You are having a gout flare.  We will treat this with steroids and colchicine.  You may want to start steroids in the morning as they may keep you well.  For the colchicine take 2 tablets tonight and then 1 hour later take another tablet.  The next day take 1 tablet daily.  Take with your primary care doctor about starting low-dose allopurinol to prevent gout flares.

## 2022-11-21 ENCOUNTER — Inpatient Hospital Stay: Payer: Medicare HMO

## 2022-11-22 ENCOUNTER — Telehealth: Payer: Self-pay | Admitting: *Deleted

## 2022-11-22 ENCOUNTER — Emergency Department
Admission: EM | Admit: 2022-11-22 | Discharge: 2022-11-22 | Disposition: A | Discharge status: Home / Self Care | Payer: Medicare HMO | Attending: Emergency Medicine | Admitting: Emergency Medicine

## 2022-11-22 ENCOUNTER — Inpatient Hospital Stay: Payer: Medicare HMO | Attending: Oncology

## 2022-11-22 ENCOUNTER — Other Ambulatory Visit: Payer: Self-pay

## 2022-11-22 DIAGNOSIS — Z859 Personal history of malignant neoplasm, unspecified: Secondary | ICD-10-CM | POA: Insufficient documentation

## 2022-11-22 DIAGNOSIS — N289 Disorder of kidney and ureter, unspecified: Secondary | ICD-10-CM | POA: Insufficient documentation

## 2022-11-22 DIAGNOSIS — G629 Polyneuropathy, unspecified: Secondary | ICD-10-CM | POA: Insufficient documentation

## 2022-11-22 DIAGNOSIS — M109 Gout, unspecified: Secondary | ICD-10-CM | POA: Diagnosis not present

## 2022-11-22 DIAGNOSIS — C182 Malignant neoplasm of ascending colon: Secondary | ICD-10-CM | POA: Diagnosis present

## 2022-11-22 DIAGNOSIS — C9000 Multiple myeloma not having achieved remission: Secondary | ICD-10-CM

## 2022-11-22 DIAGNOSIS — F32A Depression, unspecified: Secondary | ICD-10-CM | POA: Insufficient documentation

## 2022-11-22 DIAGNOSIS — D696 Thrombocytopenia, unspecified: Secondary | ICD-10-CM | POA: Diagnosis not present

## 2022-11-22 DIAGNOSIS — D649 Anemia, unspecified: Secondary | ICD-10-CM | POA: Insufficient documentation

## 2022-11-22 DIAGNOSIS — R739 Hyperglycemia, unspecified: Secondary | ICD-10-CM

## 2022-11-22 DIAGNOSIS — C184 Malignant neoplasm of transverse colon: Secondary | ICD-10-CM

## 2022-11-22 DIAGNOSIS — R972 Elevated prostate specific antigen [PSA]: Secondary | ICD-10-CM | POA: Insufficient documentation

## 2022-11-22 LAB — COMPREHENSIVE METABOLIC PANEL
ALT: 62 U/L — ABNORMAL HIGH (ref 0–44)
AST: 43 U/L — ABNORMAL HIGH (ref 15–41)
Albumin: 3.8 g/dL (ref 3.5–5.0)
Alkaline Phosphatase: 257 U/L — ABNORMAL HIGH (ref 38–126)
Anion gap: 10 (ref 5–15)
BUN: 43 mg/dL — ABNORMAL HIGH (ref 8–23)
CO2: 26 mmol/L (ref 22–32)
Calcium: 8.9 mg/dL (ref 8.9–10.3)
Chloride: 89 mmol/L — ABNORMAL LOW (ref 98–111)
Creatinine, Ser: 1.87 mg/dL — ABNORMAL HIGH (ref 0.61–1.24)
GFR, Estimated: 39 mL/min — ABNORMAL LOW (ref >60)
Glucose, Bld: 742 mg/dL (ref 70–99)
Potassium: 4.9 mmol/L (ref 3.5–5.1)
Sodium: 125 mmol/L — ABNORMAL LOW (ref 135–145)
Total Bilirubin: 0.6 mg/dL (ref 0.3–1.2)
Total Protein: 8.7 g/dL — ABNORMAL HIGH (ref 6.5–8.1)

## 2022-11-22 LAB — CBC WITH DIFFERENTIAL/PLATELET
Abs Immature Granulocytes: 0.03 10*3/uL (ref 0.00–0.07)
Abs Immature Granulocytes: 0.03 10*3/uL (ref 0.00–0.07)
Basophils Absolute: 0 10*3/uL (ref 0.0–0.1)
Basophils Absolute: 0 10*3/uL (ref 0.0–0.1)
Basophils Relative: 0 %
Basophils Relative: 0 %
Eosinophils Absolute: 0 10*3/uL (ref 0.0–0.5)
Eosinophils Absolute: 0 10*3/uL (ref 0.0–0.5)
Eosinophils Relative: 0 %
Eosinophils Relative: 0 %
HCT: 36.8 % — ABNORMAL LOW (ref 39.0–52.0)
HCT: 37.6 % — ABNORMAL LOW (ref 39.0–52.0)
Hemoglobin: 12 g/dL — ABNORMAL LOW (ref 13.0–17.0)
Hemoglobin: 12.2 g/dL — ABNORMAL LOW (ref 13.0–17.0)
Immature Granulocytes: 1 %
Immature Granulocytes: 1 %
Lymphocytes Relative: 5 %
Lymphocytes Relative: 5 %
Lymphs Abs: 0.3 10*3/uL — ABNORMAL LOW (ref 0.7–4.0)
Lymphs Abs: 0.3 10*3/uL — ABNORMAL LOW (ref 0.7–4.0)
MCH: 27.4 pg (ref 26.0–34.0)
MCH: 27.4 pg (ref 26.0–34.0)
MCHC: 32.6 g/dL (ref 30.0–36.0)
MCHC: 32.4 g/dL (ref 30.0–36.0)
MCV: 84 fL (ref 80.0–100.0)
MCV: 84.5 fL (ref 80.0–100.0)
Monocytes Absolute: 0.2 10*3/uL (ref 0.1–1.0)
Monocytes Absolute: 0.3 10*3/uL (ref 0.1–1.0)
Monocytes Relative: 4 %
Monocytes Relative: 5 %
Neutro Abs: 4.2 10*3/uL (ref 1.7–7.7)
Neutro Abs: 5.1 10*3/uL (ref 1.7–7.7)
Neutrophils Relative %: 90 %
Neutrophils Relative %: 89 %
Platelets: 149 10*3/uL — ABNORMAL LOW (ref 150–400)
Platelets: 166 10*3/uL (ref 150–400)
RBC: 4.38 MIL/uL (ref 4.22–5.81)
RBC: 4.45 MIL/uL (ref 4.22–5.81)
RDW: 13.7 % (ref 11.5–15.5)
RDW: 13.9 % (ref 11.5–15.5)
WBC: 4.7 10*3/uL (ref 4.0–10.5)
WBC: 5.7 10*3/uL (ref 4.0–10.5)
nRBC: 0 % (ref 0.0–0.2)
nRBC: 0 % (ref 0.0–0.2)

## 2022-11-22 LAB — BASIC METABOLIC PANEL
Anion gap: 10 (ref 5–15)
BUN: 43 mg/dL — ABNORMAL HIGH (ref 8–23)
CO2: 27 mmol/L (ref 22–32)
Calcium: 8.5 mg/dL — ABNORMAL LOW (ref 8.9–10.3)
Chloride: 89 mmol/L — ABNORMAL LOW (ref 98–111)
Creatinine, Ser: 1.85 mg/dL — ABNORMAL HIGH (ref 0.61–1.24)
GFR, Estimated: 40 mL/min — ABNORMAL LOW (ref >60)
Glucose, Bld: 737 mg/dL (ref 70–99)
Potassium: 5.7 mmol/L — ABNORMAL HIGH (ref 3.5–5.1)
Sodium: 126 mmol/L — ABNORMAL LOW (ref 135–145)

## 2022-11-22 LAB — BLOOD GAS, VENOUS
Acid-Base Excess: 6.6 mmol/L — ABNORMAL HIGH (ref 0.0–2.0)
Bicarbonate: 31.9 mmol/L — ABNORMAL HIGH (ref 20.0–28.0)
O2 Saturation: 97.2 %
Patient temperature: 37
pCO2, Ven: 47 mmHg (ref 44–60)
pH, Ven: 7.44 — ABNORMAL HIGH (ref 7.25–7.43)
pO2, Ven: 76 mmHg — ABNORMAL HIGH (ref 32–45)

## 2022-11-22 LAB — CBG MONITORING, ED
Glucose-Capillary: >600 mg/dL (ref 70–99)
Glucose-Capillary: 572 mg/dL (ref 70–99)
Glucose-Capillary: 345 mg/dL — ABNORMAL HIGH (ref 70–99)

## 2022-11-22 LAB — BETA-HYDROXYBUTYRIC ACID: Beta-Hydroxybutyric Acid: 0.11 mmol/L (ref 0.05–0.27)

## 2022-11-22 LAB — PSA: Prostatic Specific Antigen: 42.03 ng/mL — ABNORMAL HIGH (ref 0.00–4.00)

## 2022-11-22 LAB — TROPONIN I (HIGH SENSITIVITY)
Troponin I (High Sensitivity): 79 ng/L — ABNORMAL HIGH (ref <18)
Troponin I (High Sensitivity): 80 ng/L — ABNORMAL HIGH (ref <18)

## 2022-11-22 MED ORDER — INSULIN ASPART 100 UNIT/ML IJ SOLN: 8.0000 [IU] | Freq: Once | INTRAMUSCULAR | Status: DC

## 2022-11-22 MED ORDER — SODIUM CHLORIDE 0.9 % IV BOLUS
1000.0000 mL | Freq: Once | INTRAVENOUS | Status: AC
  Administered 2022-11-22: 1000 mL via INTRAVENOUS

## 2022-11-22 MED ORDER — INSULIN ASPART 100 UNIT/ML IJ SOLN
10.0000 [IU] | Freq: Once | INTRAMUSCULAR | Status: AC
  Administered 2022-11-22: 10 [IU] via INTRAVENOUS
  Filled 2022-11-22: qty 1

## 2022-11-22 NOTE — ED Notes (Signed)
Pt verbalizes understanding of discharge instructions. Opportunity for questioning and answers were provided. Pt discharged from ED to home.   ? ?

## 2022-11-22 NOTE — Telephone Encounter (Signed)
Received critical lab results with read back at 10:56 am; Glucose is 737. RN called pt immediately. He is in the car almost home in [place]. I told him he needed to go to ED. He said he was close by [facility] and was agreeable to go there. I see where pt had a recent flare of Gout. He was given a steroid injection and prednisone taper which is driving his blood sugar up. He is halfway through taper. Pt said he is feeling alittle weak, very thirsty and voiding constantly last couple of days. He does not take any meds for diabetes. He is "pre-diabetic". Pt stayed on phone with me until he walked into Urgent care safely.

## 2022-11-22 NOTE — ED Provider Notes (Signed)
[facility] Provider Note    Event Date/Time   First MD Initiated Contact with Patient 11/22/22 1210     (approximate)   History   Hyperglycemia   HPI  Chris George is a 67 y.o. male   Past medical history of atrial fibrillation not on anticoagulation due to patient preference, gout, prediabetic, cancer no longer being treated on surveillance now, who presents to the emergency department with hyperglycemia from outpatient lab draw.  Patient with polyuria and polydipsia but denies any other acute complaints including chest pain, respiratory infectious symptoms, abdominal pain nausea vomiting diarrhea, urinary symptoms.  A similar occurrence 1 took prednisone for gout flare in the past.  He is on a long prednisone taper for gout flare.  Gout flare is much improved asymptomatic now.  He is not on diabetic medications nor insulin.   External Medical Documents Reviewed: Emergency department visit dated 10/26/2022 when he was diagnosed with gout flare and given prednisone prescription.      Physical Exam   Triage Vital Signs: ED Triage Vitals  Enc Vitals Group     BP 11/22/22 1201 (!) 160/79     Pulse Rate 11/22/22 1201 64     Resp 11/22/22 1201 16     Temp 11/22/22 1201 97.7 F (36.5 C)     Temp Source 11/22/22 1201 Oral     SpO2 11/22/22 1201 96 %     Weight 11/22/22 1202 216 lb (98 kg)     Height 11/22/22 1202 '5\' 9"'$  (1.753 m)     Head Circumference --      Peak Flow --      Pain Score 11/22/22 1202 0     Pain Loc --      Pain Edu? --      Excl. in [place]? --     Most recent vital signs: Vitals:   11/22/22 1430 11/22/22 1500  BP: 122/89 132/76  Pulse: (!) 58 67  Resp: 18 18  Temp:    SpO2: 97% 98%    General: Awake, no distress.  CV:  Good peripheral perfusion.  Resp:  Normal effort.  Abd:  No distention.  Other:  Awake alert oriented, pleasant nontoxic-appearing comfortable with normal hemodynamics.  Abdomen soft nontender lungs  clear   ED Results / Procedures / Treatments   Labs (all labs ordered are listed, but only abnormal results are displayed) Labs Reviewed  COMPREHENSIVE METABOLIC PANEL - Abnormal; Notable for the following components:      Result Value   Sodium 125 (*)    Chloride 89 (*)    Glucose, Bld 742 (*)    BUN 43 (*)    Creatinine, Ser 1.87 (*)    Total Protein 8.7 (*)    AST 43 (*)    ALT 62 (*)    Alkaline Phosphatase 257 (*)    GFR, Estimated 39 (*)    All other components within normal limits  CBC WITH DIFFERENTIAL/PLATELET - Abnormal; Notable for the following components:   Hemoglobin 12.2 (*)    HCT 37.6 (*)    Lymphs Abs 0.3 (*)    All other components within normal limits  BLOOD GAS, VENOUS - Abnormal; Notable for the following components:   pH, Ven 7.44 (*)    pO2, Ven 76 (*)    Bicarbonate 31.9 (*)    Acid-Base Excess 6.6 (*)    All other components within normal limits  CBG MONITORING, ED - Abnormal; Notable for  the following components:   Glucose-Capillary >600 (*)    All other components within normal limits  CBG MONITORING, ED - Abnormal; Notable for the following components:   Glucose-Capillary 572 (*)    All other components within normal limits  CBG MONITORING, ED - Abnormal; Notable for the following components:   Glucose-Capillary 345 (*)    All other components within normal limits  TROPONIN I (HIGH SENSITIVITY) - Abnormal; Notable for the following components:   Troponin I (High Sensitivity) 79 (*)    All other components within normal limits  TROPONIN I (HIGH SENSITIVITY) - Abnormal; Notable for the following components:   Troponin I (High Sensitivity) 80 (*)    All other components within normal limits  BETA-HYDROXYBUTYRIC ACID     I ordered and reviewed the above labs they are notable for markedly elevated glucose above 600, normal anion gap.  Initial troponin is 79.  EKG  ED ECG REPORT I, Lucillie Garfinkel, the attending physician, personally viewed  and interpreted this ECG.   Date: 11/22/2022  EKG Time: 1313  Rate: 67  Rhythm: AF 60s  Axis: nl  Intervals:rbbb  ST&T Change: No acute ischemic changes    PROCEDURES:  Critical Care performed: No  Procedures   MEDICATIONS ORDERED IN ED: Medications  sodium chloride 0.9 % bolus 1,000 mL (0 mLs Intravenous Stopped 11/22/22 1426)  insulin aspart (novoLOG) injection 10 Units (10 Units Intravenous Given 11/22/22 1240)  sodium chloride 0.9 % bolus 1,000 mL (1,000 mLs Intravenous New Bag/Given 11/22/22 1435)  insulin aspart (novoLOG) injection 10 Units (10 Units Intravenous Given 11/22/22 1433)    IMPRESSION / MDM / ASSESSMENT AND PLAN / ED COURSE  I reviewed the triage vital signs and the nursing notes.                                Patient's presentation is most consistent with acute presentation with potential threat to life or bodily function.  Differential diagnosis includes, but is not limited to, upper glycemia due to medication adverse effect, DKA, infection, ACS   The patient is on the cardiac monitor to evaluate for evidence of arrhythmia and/or significant heart rate changes.  MDM: Hyperglycemia most likely due to adverse effect of prednisone, does not appear to be in DKA from labs, no other acute medical complaints to suggest underlying infection or ACS as a driver of his hyperglycemia.  Will treat with fluids, insulin, recheck and have him stop his prednisone follow-up with PMD for further diabetic medication management and gout management in the future to not include prednisone.  Elevated LFTs, alk phos, patient without any abdominal pain or tenderness nausea or vomiting.  He will follow-up for recheck.  Is elevated but stable, no prior to compare to.  No chest pain or shortness of breath, doubt ACS.  Nonischemic EKG.  Feels well would like to go home blood sugar has decreased to 500 from 700 no signs of DKA.  Give a little bit more fluid and insulin to  recheck  I considered hospitalization for admission or observation but with no symptoms and adequate follow-up, plan to manage hyperglycemia is follow-up with primary doctor for diabetes medication management and recheck off of prednisone as I believe this is the inciting event.        FINAL CLINICAL IMPRESSION(S) / ED DIAGNOSES   Final diagnoses:  Hyperglycemia     Rx / DC Orders   ED  Discharge Orders     None        Note:  This document was prepared using Dragon voice recognition software and may include unintentional dictation errors.    Lucillie Garfinkel, MD 11/22/22 [phone]

## 2022-11-22 NOTE — ED Notes (Signed)
Pt reports cramping in various parts of his body. States that he does this and it's usually from his potassium being low. This RN explained to pt that his potassium is normal at 4.9. MD notified.

## 2022-11-22 NOTE — Inpatient Diabetes Management (Signed)
Inpatient Diabetes Program Recommendations  AACE/ADA: New Consensus Statement on Inpatient Glycemic Control (2015)  Target Ranges:  Prepandial:   less than 140 mg/dL      Peak postprandial:   less than 180 mg/dL (1-2 hours)      Critically ill patients:  140 - 180 mg/dL   Lab Results  Component Value Date   GLUCAP 572 (HH) 11/22/2022   HGBA1C 6.7 (H) 01/05/2012    Review of Glycemic Control  Latest Reference Range & Units 11/22/22 12:00 11/22/22 14:16  Glucose-Capillary 70 - 99 mg/dL >600 (HH) 572 (HH)  (HH): Data is critically high  Diabetes history: DM2 Outpatient Diabetes medications: none Current orders for Inpatient glycemic control: IVF, Novolog 10 units  x 2   Spoke with patient at bedside.  He states he has had pre-DM for a few years.  He does not follow a diabetic diet and does not limit caloric beverages.  His last A1C was 6.9% on Mar 12, 2022.  Explained basic pathophysiology for DM2.  He is not interested in medications.  He has been sedentary since he started having neuropathy in his hands and feet s/p chemotherapy for multiple myeloma and colon cancer.  He received 2 steroid shots and was started on a Prednisone taper for a gout flare.  He is half way through that taper. He knew his blood glucose was elevated because he was thirsty and voiding often.  He states this has happened one other time when he was on steroids.  He is current with his PCP.  Encouraged him to eliminate caloric beverages, modify his carbohydrate intake and be mindful of portion control.  Explained long and short term complications of uncontrolled glucose.    Will continue to follow while inpatient.  Thank you, Reche Dixon, MSN, [place] Diabetes Coordinator Inpatient Diabetes Program [phone] (team pager from 8a-5p)

## 2022-11-22 NOTE — Discharge Instructions (Signed)
Follow-up with your primary doctor to discuss blood sugar recheck and medications as needed, as well as rechecking your liver tests which were slightly elevated today.  Stop taking your prednisone as this can lead to blood sugar problems.

## 2022-11-22 NOTE — ED Triage Notes (Signed)
Pt reports being border line diabetic and being in remission from colon cancer. Pt is currently on a steroid for gout and was instructed to come to ED after blood draw from the cancer center for his blood glucose being in the 700s. Pt c/o increase thirst and frequent urination. Pt CBG is HI on the glucometer in triage.

## 2022-11-23 LAB — KAPPA/LAMBDA LIGHT CHAINS
Kappa free light chain: 24.8 mg/L — ABNORMAL HIGH (ref 3.3–19.4)
Kappa, lambda light chain ratio: 1.52 (ref 0.26–1.65)
Lambda free light chains: 16.3 mg/L (ref 5.7–26.3)

## 2022-11-23 LAB — IGG, IGA, IGM
IgA: 97 mg/dL (ref 61–437)
IgG (Immunoglobin G), Serum: 2524 mg/dL — ABNORMAL HIGH (ref 603–1613)
IgM (Immunoglobulin M), Srm: 111 mg/dL (ref 20–172)

## 2022-11-23 LAB — CEA: CEA: 117 ng/mL — ABNORMAL HIGH (ref 0.0–4.7)

## 2022-11-26 LAB — PROTEIN ELECTROPHORESIS, SERUM
A/G Ratio: 0.9 (ref 0.7–1.7)
Albumin ELP: 3.7 g/dL (ref 2.9–4.4)
Alpha-1-Globulin: 0.3 g/dL (ref 0.0–0.4)
Alpha-2-Globulin: 0.7 g/dL (ref 0.4–1.0)
Beta Globulin: 1 g/dL (ref 0.7–1.3)
Gamma Globulin: 2.1 g/dL — ABNORMAL HIGH (ref 0.4–1.8)
Globulin, Total: 4.1 g/dL — ABNORMAL HIGH (ref 2.2–3.9)
M-Spike, %: 1.7 g/dL — ABNORMAL HIGH
Total Protein ELP: 7.8 g/dL (ref 6.0–8.5)

## 2022-11-28 ENCOUNTER — Ambulatory Visit: Payer: Medicare HMO | Admitting: Oncology

## 2022-11-29 ENCOUNTER — Inpatient Hospital Stay (HOSPITAL_BASED_OUTPATIENT_CLINIC_OR_DEPARTMENT_OTHER): Payer: Medicare HMO | Admitting: Oncology

## 2022-11-29 ENCOUNTER — Encounter: Payer: Self-pay | Admitting: Oncology

## 2022-11-29 VITALS — BP 117/71 | HR 89 | Temp 97.6°F | Resp 16 | Ht 69.0 in | Wt 218.0 lb

## 2022-11-29 DIAGNOSIS — R972 Elevated prostate specific antigen [PSA]: Secondary | ICD-10-CM

## 2022-11-29 DIAGNOSIS — C184 Malignant neoplasm of transverse colon: Secondary | ICD-10-CM

## 2022-11-29 NOTE — Progress Notes (Signed)
[place]  [phone] [phone]) [phone]  ID: Chris George OB: Feb 08, 1956  MR#: [id number]  IRC#:[id number]  Patient Care Team: Sofie Hartigan, MD as PCP - General (Family Medicine) Lloyd Huger, MD as Consulting Physician (Oncology) Anabel Bene, MD as Referring Physician (Neurology)  CHIEF COMPLAINT: Recurrent colon cancer, smoldering myeloma.  INTERVAL HISTORY: Patient returns to clinic today for repeat laboratory work and routine 47-monthevaluation.  His peripheral neuropathy seems to be worse, but he otherwise feels well.  He also continues to have intermittent joint pain secondary to gout.  He has no other neurologic complaints. He has no chest pain, shortness of breath, cough, or hemoptysis.  He denies any nausea, vomiting, constipation, or diarrhea.  He has no melena or hematochezia.  He has no urinary complaints.  Patient offers no further specific complaints today.  REVIEW OF SYSTEMS:   Review of Systems  Constitutional:  Positive for malaise/fatigue. Negative for fever and weight loss.  Respiratory: Negative.  Negative for cough, hemoptysis and shortness of breath.   Cardiovascular: Negative.  Negative for chest pain and leg swelling.  Gastrointestinal: Negative.  Negative for abdominal pain, blood in stool and melena.  Genitourinary: Negative.  Negative for hematuria.  Musculoskeletal:  Positive for joint pain. Negative for back pain.  Skin: Negative.  Negative for rash.  Neurological:  Positive for tingling, sensory change and weakness. Negative for dizziness, focal weakness and headaches.  Psychiatric/Behavioral: Negative.  Negative for depression. The patient is not nervous/anxious.     As per HPI. Otherwise, a complete review of systems is negative.  PAST MEDICAL HISTORY: Past Medical History:  Diagnosis Date   [place]-fib ([place]    Anemia    Arthritis    ankles   Cancer ([place]    multiple myeloma   Cancer of transverse  colon ([facility] 10/26/2019   [place]    Chemotherapy-induced peripheral neuropathy (HCC)    CHF (congestive heart failure) (HCC)    Depression    Dysrhythmia    atrial fib   HLD (hyperlipidemia)    Hypercholesteremia    Hypertension    Moderate tricuspid insufficiency    Multiple myeloma (HCC)    not being treated right now per pt 11/11/19   Pneumonia    Sleep apnea    No CPAP.  OSA resolved with wt loss.    PAST SURGICAL HISTORY: Past Surgical History:  Procedure Laterality Date   ATRIAL ABLATION SURGERY     CARDIAC SURGERY     A-Fib Ablations   COLONOSCOPY WITH PROPOFOL N/A 10/26/2019   Procedure: COLONOSCOPY WITH BIOPSY;  Surgeon: WLucilla Lame MD;  [place]  Service: Endoscopy;  Laterality: N/A;   COLONOSCOPY WITH PROPOFOL N/A 09/03/2022   Procedure: COLONOSCOPY WITH PROPOFOL;  Surgeon: WLucilla Lame MD;  [place] [facility]  Service: Endoscopy;  Laterality: N/A;   ESOPHAGOGASTRODUODENOSCOPY (EGD) WITH PROPOFOL N/A 10/26/2019   Procedure: ESOPHAGOGASTRODUODENOSCOPY (EGD) WITH BIOPSY;  Surgeon: WLucilla Lame MD;  [place] [facility]  Service: Endoscopy;  Laterality: N/A;  sleep apnea   LAPAROSCOPIC PARTIAL COLECTOMY Right 11/17/2019   Procedure: LAPAROSCOPIC PARTIAL COLECTOMY RIGHT EXTENDED;  Surgeon: POlean Ree MD;  Location: [facility];  Service: General;  Laterality: Right;   PORTACATH PLACEMENT N/A 12/28/2019   Procedure: INSERTION PORT-A-CATH Left subclavian;  Surgeon: POlean Ree MD;  Location: [facility];  Service: General;  Laterality: N/A;    FAMILY HISTORY: Family History  Problem Relation Age of Onset  Healthy Mother     ADVANCED DIRECTIVES (Y/N):  N  HEALTH MAINTENANCE: Social History   Tobacco Use   Smoking status: Never   Smokeless tobacco: Never  Vaping Use   Vaping Use: Never used  Substance Use Topics   Alcohol use: Not Currently   Drug use: Never     Colonoscopy:  PAP:  Bone  density:  Lipid panel:  No Known Allergies  Current Outpatient Medications  Medication Sig Dispense Refill   allopurinol (ZYLOPRIM) 100 MG tablet Take 100 mg by mouth daily.     APPLE CIDER VINEGAR PO Take 30-45 mLs by mouth every other day.      aspirin 325 MG EC tablet Take 325 mg by mouth every other day. In the morning     colchicine 0.6 MG tablet Take 1 tablet (0.6 mg total) by mouth daily. 8 tablet 0   digoxin (LANOXIN) 0.25 MG tablet Take 0.25 mg by mouth daily.      furosemide (LASIX) 80 MG tablet Take 80 mg by mouth daily. Morning & late afternoon     MAGNESIUM PO Take by mouth.     Multiple Vitamins-Minerals (ZINC PO) Take by mouth.     potassium bicarbonate (K-LYTE) 25 MEQ disintegrating tablet Take 25 mEq by mouth 4 (four) times a week. In the afternoon.     Zinc Sulfate (ZINC 15 PO) Take by mouth.     Misc Natural Products (TART CHERRY ADVANCED PO) Take 60 mLs by mouth every other day. (Patient not taking: Reported on 11/29/2022)     No current facility-administered medications for this visit.    OBJECTIVE: Vitals:   11/29/22 0944  BP: 117/71  Pulse: 89  Resp: 16  Temp: 97.6 F (36.4 C)  SpO2: 98%      Body mass index is 32.19 kg/m.    ECOG FS:0 - Asymptomatic  General: Well-developed, well-nourished, no acute distress. Eyes: Pink conjunctiva, anicteric sclera. HEENT: Normocephalic, moist mucous membranes. Lungs: No audible wheezing or coughing. Heart: Regular rate and rhythm. Abdomen: Soft, nontender, no obvious distention. Musculoskeletal: No edema, cyanosis, or clubbing. Neuro: Alert, answering all questions appropriately. Cranial nerves grossly intact. Skin: No rashes or petechiae noted. Psych: Normal affect.  LAB RESULTS:  Lab Results  Component Value Date   NA 125 (L) 11/22/2022   K 4.9 11/22/2022   CL 89 (L) 11/22/2022   CO2 26 11/22/2022   GLUCOSE 742 (HH) 11/22/2022   BUN 43 (H) 11/22/2022   CREATININE 1.87 (H) 11/22/2022   CALCIUM 8.9  11/22/2022   PROT 8.7 (H) 11/22/2022   ALBUMIN 3.8 11/22/2022   AST 43 (H) 11/22/2022   ALT 62 (H) 11/22/2022   ALKPHOS 257 (H) 11/22/2022   BILITOT 0.6 11/22/2022   GFRNONAA 39 (L) 11/22/2022   GFRAA >60 07/29/2020    Lab Results  Component Value Date   WBC 5.7 11/22/2022   NEUTROABS 5.1 11/22/2022   HGB 12.2 (L) 11/22/2022   HCT 37.6 (L) 11/22/2022   MCV 84.5 11/22/2022   PLT 166 11/22/2022   Lab Results  Component Value Date   IRON 41 (L) 05/11/2020   TIBC 323 05/11/2020   IRONPCTSAT 13 (L) 05/11/2020   Lab Results  Component Value Date   FERRITIN 86 05/11/2020     STUDIES: No results found.   ASSESSMENT: Stage IIIc colon cancer, smoldering myeloma.  PLAN:    1.  Stage IIIc colon cancer: Patient completed cycle 9 of adjuvant FOLFOX on June 03, 2020.  Given his difficulties with treatment and declining performance status, treatment was discontinued altogether.  Repeat CT scan on August 16, 2022 reviewed independently with no obvious evidence of recurrent or progressive disease.  Colonoscopy on September 03, 2022 did not reveal any significant pathology and recommendation was to repeat in 3 years.  Despite this, patient's CEA continues to drastically trend up and is now 117.  Will repeat CT scans and patient will have follow-up 1 to 2 days after imaging.   2. Smoldering myeloma: Patient was initially diagnosed and treated in [place], [place] and treated with single agent Velcade in approximately 2013.  He was evaluated in clinic in July 2014 when he declined any maintenance treatment or referral for bone marrow transplant.  He was subsequently lost to follow-up.  His M spike has ranged from 1.1-2.0 since July 2020.  His most recent result was 1.7.  Over the same timeframe his IgG component has ranged from [phone].  His most recent result is 2524.  Finally, his kappa free light chains have ranged from 48.1-75.3 with his most recent result at 24.8.  Metastatic bone survey  on May 20, 2019 reviewed independently with no obvious bony lesions.  His most recent bone marrow biopsy completed on May 28, 2019 revealed only 10 to 15% plasma cells with no clonality reported.  Cytogenetics were also reported as normal.  Continue to monitor closely.   3.  Anemia: Hemoglobin improved to 12.0.  He last received IV Feraheme on October 06, 2019.   4.  Renal insufficiency: Chronic and unchanged.  Patient's most recent creatinine is 1.5.  Continue follow-up with nephrology as indicated.   5.  Peripheral neuropathy: Chronic and unchanged.  Gabapentin was previously discontinued.  Patient was given a referral to Occupational Therapy as well as initiated on Cymbalta.  Referral back to neuro-oncology. 6.  Leukopenia: Resolved.   7. Thrombocytopenia: Mild.  Patient's platelet count is 149.   8.  Elevated PSA: Patient's PSA continues to trend up and is now 42.03.  Patient was given a referral to urology, but never scheduled an appointment.  He was given a referral back to urology.  Will also order a nuclear med bone scan along with CT scan as above.   9.  Depressive-like symptoms: Cymbalta 20 mg daily as above.  10.  Hyperglycemia: Patient had significantly elevated blood glucose recently which she attributes to steroids.  Patient is not on insulin.  Continue monitoring and treatment per primary care. 11.  Gout: Recommend no further steroids for treatment given his significant hyperglycemia.  Patient expressed understanding and was in agreement with this plan. He also understands that He can call clinic at any time with any questions, concerns, or complaints.    Lloyd Huger, MD   11/29/2022 10:40 AM

## 2022-11-29 NOTE — Progress Notes (Signed)
Really bad neuropathy and gout. Would like a referral to [facility] if he qualifies

## 2022-12-05 ENCOUNTER — Ambulatory Visit: Admission: RE | Admit: 2022-12-05 | Payer: Medicare HMO | Source: Ambulatory Visit

## 2022-12-07 ENCOUNTER — Ambulatory Visit
Admission: RE | Admit: 2022-12-07 | Discharge: 2022-12-07 | Disposition: A | Discharge status: Home / Self Care | Payer: Medicare HMO | Source: Ambulatory Visit | Attending: Oncology | Admitting: Oncology

## 2022-12-07 DIAGNOSIS — C184 Malignant neoplasm of transverse colon: Secondary | ICD-10-CM | POA: Diagnosis present

## 2022-12-07 MED ORDER — IOHEXOL 300 MG/ML  SOLN
60.0000 mL | Freq: Once | INTRAMUSCULAR | Status: AC | PRN
  Administered 2022-12-07: 60 mL via INTRAVENOUS

## 2022-12-13 ENCOUNTER — Encounter
Admission: RE | Admit: 2022-12-13 | Discharge: 2022-12-13 | Disposition: A | Discharge status: Home / Self Care | Payer: Medicare HMO | Source: Ambulatory Visit | Attending: Oncology | Admitting: Oncology

## 2022-12-13 DIAGNOSIS — R972 Elevated prostate specific antigen [PSA]: Secondary | ICD-10-CM | POA: Insufficient documentation

## 2022-12-13 MED ORDER — TECHNETIUM TC 99M MEDRONATE IV KIT
20.0000 | PACK | Freq: Once | INTRAVENOUS | Status: AC | PRN
  Administered 2022-12-13: 20.6 via INTRAVENOUS

## 2022-12-18 ENCOUNTER — Ambulatory Visit: Payer: Medicare HMO | Admitting: Urology

## 2022-12-21 ENCOUNTER — Inpatient Hospital Stay: Payer: Medicare HMO | Admitting: Internal Medicine

## 2022-12-26 ENCOUNTER — Ambulatory Visit (INDEPENDENT_AMBULATORY_CARE_PROVIDER_SITE_OTHER): Payer: Medicare HMO | Admitting: Urology

## 2022-12-26 ENCOUNTER — Encounter: Payer: Self-pay | Admitting: Urology

## 2022-12-26 VITALS — BP 116/74 | HR 73 | Ht 69.0 in | Wt 218.0 lb

## 2022-12-26 DIAGNOSIS — R972 Elevated prostate specific antigen [PSA]: Secondary | ICD-10-CM | POA: Diagnosis not present

## 2022-12-26 NOTE — Progress Notes (Signed)
   12/26/2022 3:04 PM   Chris George 02-Jul-1956 [id number]  Reason for visit: Follow up elevated PSA  HPI: I saw [name] today for an elevated PSA of 42.  I previously saw him in November 2022 and recommended a prostate MRI when PSA was 15, but he never followed up.  He has a complex medical history including stage III colon cancer treated with chemotherapy, surgery, radiation, as well as a history of smoldering multiple myeloma.  He has a family history of prostate cancer in his father and brother.  PSA has continued to steadily increase, and he did not follow-up with urology until today.  His CEA has also increased, most recently 117 in January 2024 which had increased from 22 in October 2023.  His oncologist Dr. Grayland Ormond ordered cross-sectional imaging in the setting of the elevated CEA and PSA.  I personally viewed and interpreted the CT chest abdomen pelvis with contrast and bone scan from early February 2024.  This shows interval enlargement of the posterior aortocaval lymph node concerning for metastatic disease from either: Or prostate cancer, no hydronephrosis or other urologic abnormality, no evidence of metastatic disease on bone scan.  DRE today 40 g firm nodular, abnormal prostate.  I had a frank conversation with the patient today about my high suspicion for prostate cancer based on his PSA trend and abnormal DRE.  We discussed the challenges of managing prostate cancer in a patient with multiple other comorbidities of colon cancer with suspected recurrence based on the CEA and lymphadenopathy, as well as his prior abdominal radiation and multiple myeloma.  We discussed options at length including a prostate biopsy, prostate MRI, PSMA PET scan, or more of a watchful waiting approach.  He overall feels well and is interested in more definitive answers and is interested in prostate biopsy which I think is reasonable.  We reviewed the implications of an elevated PSA and the  uncertainty surrounding it. In general, a man's PSA increases with age and is produced by both normal and cancerous prostate tissue. The differential diagnosis for elevated PSA includes BPH, prostate cancer, infection, recent intercourse/ejaculation, recent urethroscopic manipulation (foley placement/cystoscopy) or trauma, and prostatitis. Management of an elevated PSA can include observation or prostate biopsy and we discussed this in detail. Our goal is to detect clinically significant prostate cancers, and manage with either active surveillance, surgery, or radiation for localized disease. Risks of prostate biopsy include bleeding, infection (including life threatening sepsis), pain, and lower urinary symptoms. Hematuria, hematospermia, and blood in the stool are all common after biopsy and can persist up to 4 weeks.   Schedule prostate biopsy   I spent 45 total minutes on the day of the encounter including pre-visit review of the medical record, face-to-face time with the patient, and post visit ordering of labs/imaging/tests.  Billey Co, [facility] [address] [place], [place] [address] [phone]

## 2022-12-26 NOTE — Patient Instructions (Addendum)

## 2022-12-28 ENCOUNTER — Telehealth: Payer: Self-pay

## 2022-12-28 NOTE — Telephone Encounter (Signed)
Cardiac clearance faxed  

## 2022-12-28 NOTE — Telephone Encounter (Signed)
-----   Message from Gordy Clement, [place] sent at 12/26/2022  1:59 PM EST ----- Regarding: Clearance Obtain cardiac clearance from Dr. Dionne Bucy.

## 2022-12-31 ENCOUNTER — Encounter: Payer: Self-pay | Admitting: Oncology

## 2022-12-31 ENCOUNTER — Ambulatory Visit
Admission: EM | Admit: 2022-12-31 | Discharge: 2022-12-31 | Disposition: A | Discharge status: Home / Self Care | Payer: Medicare HMO | Attending: Emergency Medicine | Admitting: Emergency Medicine

## 2022-12-31 DIAGNOSIS — M1A09X0 Idiopathic chronic gout, multiple sites, without tophus (tophi): Secondary | ICD-10-CM | POA: Diagnosis not present

## 2022-12-31 MED ORDER — COLCHICINE 0.6 MG PO TABS: 0.6000 mg | ORAL_TABLET | Freq: Every day | ORAL | 0 refills | Status: DC

## 2022-12-31 MED ORDER — DEXAMETHASONE SODIUM PHOSPHATE 10 MG/ML IJ SOLN
10.0000 mg | Freq: Once | INTRAMUSCULAR | Status: AC
  Administered 2022-12-31: 10 mg via INTRAMUSCULAR

## 2022-12-31 NOTE — Discharge Instructions (Signed)
Today you are being treated for gout  Given an injection of Decadron here in the office to help reduce inflammation which in turn will help with your pain  You may begin use of colchicine as directed for additional comfort  Continue taking daily allopurinol as directed  May use ice or heat over the affected areas  You may use massage and stretching as tolerated  Please follow-up with your primary doctor if your symptoms continue to persist or worsen

## 2022-12-31 NOTE — ED Triage Notes (Signed)
Pt states he has a history of gout. 2-days ago his right hand started to flare-up. Pt states he been o medication for his gout, however they are not helping.

## 2022-12-31 NOTE — ED Provider Notes (Signed)
[facility]    CSN: [id number] Arrival date & time: 12/31/22  1518      History   Chief Complaint Chief Complaint  Patient presents with   Gout   Hand Pain    HPI Chris George is a 67 y.o. male.   Patient presents for evaluation of right hand pain, right wrist pain and right elbow pain beginning 4 days ago.  Pain initially was intermittent limited range symptoms are similar to his gout flares which taking daily allopurinol denies injury or trauma, numbness or tingling.  Past Medical History:  Diagnosis Date   A-fib ([place])    Anemia    Arthritis    ankles   Cancer ([place])    multiple myeloma   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[place]) 10/26/2019   Cardiomyopathy ([place])    Chemotherapy-induced peripheral neuropathy (HCC)    CHF (congestive heart failure) (HCC)    Depression    Dysrhythmia    atrial fib   HLD (hyperlipidemia)    Hypercholesteremia    Hypertension    Moderate tricuspid insufficiency    Multiple myeloma (HCC)    not being treated right now per pt 11/11/19   Pneumonia    Sleep apnea    No CPAP.  OSA resolved with wt loss.    Patient Active Problem List   Diagnosis Date Noted   Special screening for malignant neoplasms, colon    Personal history of colon cancer    Chemotherapy-induced peripheral neuropathy ([place]) 02/16/2022   Diabetes mellitus type 2, controlled, without complications ([place]) [id number]   Difficulty walking 08/30/2020   Numbness 08/30/2020   Neuropathy 08/30/2020   Port-A-Cath in place    Abnormal ECG 11/04/2019   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[place]) 10/27/2019   Neoplasm of digestive system    Intraoperative partial intestinal obstruction (HCC)    Acute esophagogastric ulcer    Goals of care, counseling/discussion 05/18/2019   Multiple myeloma ([place]) 05/17/2019   Anemia 05/12/2019   Arthritis of left ankle 02/05/2018   Cardiomyopathy, idiopathic ([place]) 01/07/2018   Enlarged heart 01/01/2017   Moderate tricuspid insufficiency  01/01/2017   OSA (obstructive sleep apnea) 01/01/2017   Chronic a-fib ([place]) 12/06/2015   Pure hypercholesterolemia 02/09/2015   Type 2 diabetes mellitus without complication ([place]) 123XX123   Anticoagulant long-term use 04/29/2014   Hyperlipidemia 04/29/2014   Hypertension, essential, benign 04/26/2014    Past Surgical History:  Procedure Laterality Date   ATRIAL ABLATION SURGERY     CARDIAC SURGERY     A-Fib Ablations   COLONOSCOPY WITH PROPOFOL N/A 10/26/2019   Procedure: COLONOSCOPY WITH BIOPSY;  Surgeon: Lucilla Lame, MD;  Location: [place];  Service: Endoscopy;  Laterality: N/A;   COLONOSCOPY WITH PROPOFOL N/A 09/03/2022   Procedure: COLONOSCOPY WITH PROPOFOL;  Surgeon: Lucilla Lame, MD;  Location: [place];  Service: Endoscopy;  Laterality: N/A;   ESOPHAGOGASTRODUODENOSCOPY (EGD) WITH PROPOFOL N/A 10/26/2019   Procedure: ESOPHAGOGASTRODUODENOSCOPY (EGD) WITH BIOPSY;  Surgeon: Lucilla Lame, MD;  Location: [facility];  Service: Endoscopy;  Laterality: N/A;  sleep apnea   LAPAROSCOPIC PARTIAL COLECTOMY Right 11/17/2019   Procedure: LAPAROSCOPIC PARTIAL COLECTOMY RIGHT EXTENDED;  Surgeon: Olean Ree, MD;  Location: [facility];  Service: General;  Laterality: Right;   PORTACATH PLACEMENT N/A 12/28/2019   Procedure: INSERTION PORT-A-CATH Left subclavian;  Surgeon: Olean Ree, MD;  Location: [facility];  Service: General;  Laterality: N/A;       Home Medications    Prior to Admission medications  Medication Sig Start Date End Date Taking? Authorizing Provider  allopurinol (ZYLOPRIM) 100 MG tablet Take 100 mg by mouth daily. 11/01/22 11/01/23 Yes [provider]  APPLE CIDER VINEGAR PO Take 30-45 mLs by mouth every other day.    Yes [provider]  aspirin 325 MG EC tablet Take 325 mg by mouth every other day. In the morning   Yes [provider]  colchicine 0.6 MG tablet Take 1 tablet (0.6 mg total) by mouth daily.  10/26/22  Yes Brimage, Vondra, DO  digoxin (LANOXIN) 0.25 MG tablet Take 0.25 mg by mouth daily.    Yes [provider]  furosemide (LASIX) 80 MG tablet Take 80 mg by mouth daily. Morning & late afternoon   Yes [provider]  MAGNESIUM PO Take by mouth.   Yes [provider]  Misc Natural Products (TART CHERRY ADVANCED PO) Take 60 mLs by mouth every other day.   Yes [provider]  Multiple Vitamins-Minerals (ZINC PO) Take by mouth.   Yes [provider]  potassium bicarbonate (K-LYTE) 25 MEQ disintegrating tablet Take 25 mEq by mouth 4 (four) times a week. In the afternoon.   Yes [provider]  Zinc Sulfate (ZINC 15 PO) Take by mouth.   Yes [provider]    Family History Family History  Problem Relation Age of Onset   Healthy Mother     Social History Social History   Tobacco Use   Smoking status: Never   Smokeless tobacco: Never  Vaping Use   Vaping Use: Never used  Substance Use Topics   Alcohol use: Not Currently   Drug use: Never     Allergies   Patient has no known allergies.   Review of Systems Review of Systems   Physical Exam Triage Vital Signs ED Triage Vitals  Enc Vitals Group     BP 12/31/22 1554 135/75     Pulse Rate 12/31/22 1554 94     Resp 12/31/22 1554 18     Temp 12/31/22 1554 99 F (37.2 C)     Temp Source 12/31/22 1554 Oral     SpO2 12/31/22 1554 99 %     Weight --      Height --      Head Circumference --      Peak Flow --      Pain Score 12/31/22 1601 8     Pain Loc --      Pain Edu? --      Excl. in [place]? --    No data found.  Updated Vital Signs BP 135/75 (BP Location: Left Arm)   Pulse 94   Temp 99 F (37.2 C) (Oral)   Resp 18   SpO2 99%   Visual Acuity Right Eye Distance:   Left Eye Distance:   Bilateral Distance:    Right Eye Near:   Left Eye Near:    Bilateral Near:     Physical Exam Constitutional:      Appearance: Normal appearance.  Eyes:      Extraocular Movements: Extraocular movements intact.  Pulmonary:     Effort: Pulmonary effort is normal.  Musculoskeletal:     Comments: Moderate to severe swelling over the dorsum aspect of the right hand, moderate swelling over the ulnar aspect of the right wrist, generalized tenderness to the hand and the wrist minimal after given to range of motion due to pain elicited.  2+ radial pulse sensation intact,    moderate  swelling over the right epicondyle with tenderness, no ecchymosis or deformity present, minimal effort given in range of motion due to pain elicited, 2+ brachial pulse  Neurological:     Mental Status: He is alert and oriented to person, place, and time. Mental status is at baseline.      UC Treatments / Results  Labs (all labs ordered are listed, but only abnormal results are displayed) Labs Reviewed - No data to display  EKG   Radiology No results found.  Procedures Procedures (including critical care time)  Medications Ordered in UC Medications - No data to display  Initial Impression / Assessment and Plan / UC Course  I have reviewed the triage vital signs and the nursing notes.  Pertinent labs & imaging results that were available during my care of the patient were reviewed by me and considered in my medical decision making (see chart for details).  Chronic gout of multiple sites  Presentation and symptomology is consistent with gout, has history, imaging deferred due to lack of injury, Decadron given injection given in office and prescribed colchicine, declined steroid taper due to effects on blood sugar, recommended supportive measures of ice and heat with activity as tolerated, may follow-up with primary doctor if symptoms persist or worsen Final Clinical Impressions(s) / UC Diagnoses   Final diagnoses:  None   Discharge Instructions   None    ED Prescriptions   None    PDMP not reviewed this encounter.   Hans Eden,  NP 12/31/22 1651

## 2023-01-01 ENCOUNTER — Encounter: Payer: Self-pay | Admitting: Oncology

## 2023-01-04 ENCOUNTER — Encounter: Payer: Self-pay | Admitting: Internal Medicine

## 2023-01-04 ENCOUNTER — Inpatient Hospital Stay: Payer: Medicare HMO | Attending: Oncology | Admitting: Internal Medicine

## 2023-01-04 VITALS — HR 68 | Temp 96.0°F | Resp 17 | Wt 220.0 lb

## 2023-01-04 DIAGNOSIS — T451X5A Adverse effect of antineoplastic and immunosuppressive drugs, initial encounter: Secondary | ICD-10-CM | POA: Diagnosis not present

## 2023-01-04 DIAGNOSIS — C184 Malignant neoplasm of transverse colon: Secondary | ICD-10-CM | POA: Diagnosis present

## 2023-01-04 DIAGNOSIS — G620 Drug-induced polyneuropathy: Secondary | ICD-10-CM | POA: Insufficient documentation

## 2023-01-04 DIAGNOSIS — Z807 Family history of other malignant neoplasms of lymphoid, hematopoietic and related tissues: Secondary | ICD-10-CM | POA: Diagnosis not present

## 2023-01-04 NOTE — Progress Notes (Signed)
Patient here to see Dr. Mickeal Skinner, complains of frequent gout flare and chronic neuropathy, not taking any medication for neuropathy

## 2023-01-04 NOTE — Progress Notes (Signed)
[facility] [place], [place] [address] [phone]   Interval Evaluation  Date of Service: 01/04/23 Chris Name: Chris George Chris MRN: [id number] Chris George Provider: Ventura Sellers, MD  Identifying Statement:  Chris George is a 67 y.o. 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  He continues to experience the same symptoms as prior.  Still with numbness and tingling in hands and feet.  Still having issues with gout arthritis, taking colchicine.  H+P (02/16/22) Chris presents today to review neuropathy symptoms.  He describes loss of sensation, numbness affecting his feet and hands since dosing chemotherapy for colon cancer in 2021.  Symptoms have not improved in over a year.  Gabapentin, dosed at '300mg'$ , has not helped at all.  He denies frank pain, burning, pins and needles.  No issues with gait, but neuropathy does limit his ability to play tennis.  Following with Dr. Grayland Ormond for colon cancer surveillance.  Medications: Current Outpatient Medications on File Prior to Visit  Medication Sig Dispense Refill   allopurinol (ZYLOPRIM) 100 MG tablet Take 100 mg by mouth daily.     APPLE CIDER VINEGAR PO Take 30-45 mLs by mouth every other day.      aspirin 325 MG EC tablet Take 325 mg by mouth every other day. In the morning     colchicine 0.6 MG tablet Take 1  tablet (0.6 mg total) by mouth daily. 9 tablet 0   digoxin (LANOXIN) 0.25 MG tablet Take 0.25 mg by mouth daily.      furosemide (LASIX) 80 MG tablet Take 80 mg by mouth daily. Morning & late afternoon     MAGNESIUM PO Take by mouth.     Misc Natural Products (TART CHERRY ADVANCED PO) Take 60 mLs by mouth every other day.     Multiple Vitamins-Minerals (ZINC PO) Take by mouth.     potassium bicarbonate (K-LYTE) 25 MEQ disintegrating tablet Take 25 mEq by mouth 4 (four) times a week. In the afternoon.     Zinc Sulfate (ZINC 15 PO) Take by mouth.     No current facility-administered medications on file prior to visit.    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  OSA resolved with wt loss.   Past Surgical History:  Past Surgical History:  Procedure Laterality Date   ATRIAL ABLATION SURGERY     CARDIAC SURGERY     A-Fib Ablations   COLONOSCOPY WITH PROPOFOL N/A 10/26/2019   Procedure: COLONOSCOPY WITH BIOPSY;  Surgeon: Lucilla Lame, MD;  Location: [facility];  Service: Endoscopy;  Laterality: N/A;   COLONOSCOPY WITH PROPOFOL N/A 09/03/2022   Procedure: COLONOSCOPY WITH PROPOFOL;  Surgeon: Lucilla Lame, MD;  Location: [facility];  Service: Endoscopy;  Laterality: N/A;   ESOPHAGOGASTRODUODENOSCOPY (EGD) WITH PROPOFOL N/A 10/26/2019   Procedure: ESOPHAGOGASTRODUODENOSCOPY (EGD) WITH BIOPSY;  Surgeon: Lucilla Lame, MD;   Location: [facility];  Service: Endoscopy;  Laterality: N/A;  sleep apnea   LAPAROSCOPIC PARTIAL COLECTOMY Right 11/17/2019   Procedure: LAPAROSCOPIC PARTIAL COLECTOMY RIGHT EXTENDED;  Surgeon: Olean Ree, MD;  Location: [facility];  Service: General;  Laterality: Right;   PORTACATH PLACEMENT N/A 12/28/2019   Procedure: INSERTION PORT-A-CATH Left subclavian;  Surgeon: Olean Ree, MD;  Location: [facility];  Service: General;  Laterality: N/A;   Social History:  Social History   Socioeconomic History   Marital status: Single    Spouse name: Not on file   Number of children: Not on file   Years of education: Not on file   Highest education level: Not on file  Occupational History   Not on file  Tobacco Use   Smoking status: Never   Smokeless tobacco: Never  Vaping Use   Vaping Use: Never used  Substance and Sexual Activity   Alcohol use: Not Currently   Drug use: Never   Sexual activity: Yes    Birth control/protection: None  Other Topics Concern   Not on file  Social History Narrative   Not on file   Social Determinants of Health   Financial Resource Strain: Low Risk  (05/28/2019)   Overall Financial Resource Strain (CARDIA)    Difficulty of Paying Living Expenses: Not hard at all  Food Insecurity: No Food Insecurity (05/28/2019)   Hunger Vital Sign    Worried About Running Out of Food in the Last Year: Never true    Ran Out of Food in the Last Year: Never true  Transportation Needs: No Transportation Needs (05/28/2019)   PRAPARE - Hydrologist (Medical): No    Lack of Transportation (Non-Medical): No  Physical Activity: Inactive (05/28/2019)   Exercise Vital Sign    Days of Exercise per Week: 0 days    Minutes of Exercise per Session: 0 min  Stress: Stress Concern Present (05/28/2019)   [place]    Feeling of Stress : To some extent  Social Connections: Moderately  Integrated (05/28/2019)   Social Connection and Isolation Panel [NHANES]    Frequency of Communication with Friends and Family: More than three times a week    Frequency of Social Gatherings with Friends and Family: More than three times a week    Attends Religious Services: More than 4 times per year    Active Member of [employer] or Organizations: Yes    Attends Archivist Meetings: More than 4 times per year    Marital Status: Divorced  Intimate Partner Violence: Not At Risk (05/28/2019)   Humiliation, Afraid, Rape, and Kick questionnaire    Fear of Current or Ex-Partner: No    Emotionally Abused: No    Physically Abused: No  Sexually Abused: No   Family History:  Family History  Problem Relation Age of Onset   Healthy Mother     Review of Systems: Constitutional: Doesn't report fevers, chills or abnormal weight loss Eyes: Doesn't report blurriness of vision Ears, nose, mouth, throat, and face: Doesn't report sore throat Respiratory: Doesn't report cough, dyspnea or wheezes Cardiovascular: Doesn't report palpitation, chest discomfort  Gastrointestinal:  Doesn't report nausea, constipation, diarrhea GU: Doesn't report incontinence Skin: Doesn't report skin rashes Neurological: Per HPI Musculoskeletal: Doesn't report joint pain Behavioral/Psych: Doesn't report anxiety  Physical Exam: Vitals:   01/04/23 1141  Pulse: 68  Resp: 17  Temp: (!) 96 F (35.6 C)  SpO2: 95%    KPS: 90. General: Alert, cooperative, pleasant, in no acute distress Head: Normal EENT: No conjunctival injection or scleral icterus.  Lungs: Resp effort normal Cardiac: Regular rate Abdomen: Non-distended abdomen Skin: No rashes cyanosis or petechiae. Extremities: No clubbing or edema  Neurologic Exam: Mental Status: Awake, alert, attentive to examiner. Oriented to self and environment. Language is fluent with intact comprehension.  Cranial Nerves: Visual acuity is grossly normal. Visual  fields are full. Extra-ocular movements intact. No ptosis. Face is symmetric Motor: Tone and bulk are normal. Power is full in both arms and legs. Reflexes are symmetric, no pathologic reflexes present.  Sensory: Stocking sensory changes Gait: Normal.   Labs: I have reviewed the data as listed    Component Value Date/Time   NA 125 (L) 11/22/2022 1220   NA 143 05/13/2013 1116   K 4.9 11/22/2022 1220   K 3.5 05/13/2013 1116   CL 89 (L) 11/22/2022 1220   CL 102 05/13/2013 1116   CO2 26 11/22/2022 1220   CO2 32 05/13/2013 1116   GLUCOSE 742 (HH) 11/22/2022 1220   GLUCOSE 122 (H) 05/13/2013 1116   BUN 43 (H) 11/22/2022 1220   BUN 14 05/13/2013 1116   CREATININE 1.87 (H) 11/22/2022 1220   CREATININE 1.53 (H) 05/13/2013 1116   CALCIUM 8.9 11/22/2022 1220   CALCIUM 8.6 05/13/2013 1116   PROT 8.7 (H) 11/22/2022 1220   PROT 8.4 (H) 07/24/2012 1915   ALBUMIN 3.8 11/22/2022 1220   ALBUMIN 4.0 07/24/2012 1915   AST 43 (H) 11/22/2022 1220   AST 32 07/24/2012 1915   ALT 62 (H) 11/22/2022 1220   ALT 37 07/24/2012 1915   ALKPHOS 257 (H) 11/22/2022 1220   ALKPHOS 129 07/24/2012 1915   BILITOT 0.6 11/22/2022 1220   BILITOT 0.6 07/24/2012 1915   GFRNONAA 39 (L) 11/22/2022 1220   GFRNONAA 50 (L) 05/13/2013 1116   GFRAA >60 07/29/2020 1505   GFRAA 58 (L) 05/13/2013 1116   Lab Results  Component Value Date   WBC 5.7 11/22/2022   NEUTROABS 5.1 11/22/2022   HGB 12.2 (L) 11/22/2022   HCT 37.6 (L) 11/22/2022   MCV 84.5 11/22/2022   PLT 166 11/22/2022    Imaging:  NM Bone Scan Whole Body  Result Date: 12/15/2022 CLINICAL DATA:  Rising PSA EXAM: NUCLEAR MEDICINE WHOLE BODY BONE SCAN TECHNIQUE: Whole body anterior and posterior images were obtained approximately 3 hours after intravenous injection of radiopharmaceutical. RADIOPHARMACEUTICALS:  20.6 mCi Technetium-48mMDP IV COMPARISON:  CT scan of the chest, abdomen, and pelvis December 07, 2022 FINDINGS: Degenerative changes in the feet  and ankles. Degenerative changes in the hips. Uptake along the anterior aspect of the iliac bones is consistent with enthesopathic changes on recent CT imaging. No scintigraphic evidence of bony metastatic disease is identified. Soft  tissues are normal. IMPRESSION: Degenerative and enthesopathic change with no scintigraphic evidence of bony metastatic disease identified. Electronically Signed   By: Dorise Bullion III M.D.   On: 12/15/2022 12:03   CT CHEST ABDOMEN PELVIS W CONTRAST  Result Date: 12/07/2022 CLINICAL DATA:  History of colon cancer, rising CEA status post partial colon resection 2021 as well as chemotherapy and radiation therapy * Tracking Code: BO * EXAM: CT CHEST, ABDOMEN, AND PELVIS WITH CONTRAST TECHNIQUE: Multidetector CT imaging of the chest, abdomen and pelvis was performed following the standard protocol during bolus administration of intravenous contrast. RADIATION DOSE REDUCTION: This exam was performed according to the departmental dose-optimization program which includes automated exposure control, adjustment of the mA and/or kV according to Chris size and/or use of iterative reconstruction technique. CONTRAST:  73m OMNIPAQUE IOHEXOL 300 MG/ML  SOLN COMPARISON:  08/16/2022 FINDINGS: CT CHEST FINDINGS Cardiovascular: Left chest port catheter. Cardiomegaly. Enlargement of the main pulmonary artery measuring up to 4.1 cm in caliber. No pericardial effusion. Mediastinum/Nodes: No enlarged mediastinal, hilar, or axillary lymph nodes. Thyroid gland, trachea, and esophagus demonstrate no significant findings. Lungs/Pleura: Diffuse bilateral bronchial wall thickening. Background of fine centrilobular pulmonary nodules, most concentrated in the lung apices. No pleural effusion or pneumothorax. Musculoskeletal: Unchanged left asymmetric gynecomastia (series 2, image 20) no acute osseous findings. CT ABDOMEN PELVIS FINDINGS Hepatobiliary: No solid liver abnormality is seen. Coarse, nodular  contour of the liver. No gallstones, gallbladder wall thickening, or biliary dilatation. Pancreas: Unremarkable. No pancreatic ductal dilatation or surrounding inflammatory changes. Spleen: Normal in size without significant abnormality. Adrenals/Urinary Tract: Adrenal glands are unremarkable. Kidneys are normal, without renal calculi, solid lesion, or hydronephrosis. Bladder is unremarkable. Stomach/Bowel: Stomach is within normal limits. Status post right hemicolectomy and reanastomosis. No evidence of bowel wall thickening, distention, or inflammatory changes. Vascular/Lymphatic: Aortic atherosclerosis. Interval enlargement of an aortocaval node posteriorly measuring 1.9 x 1.4 cm, previously 1.2 x 1.1 cm (series 2, image 70). Reproductive: Prostatomegaly Other: No abdominal wall hernia or abnormality. No ascites. Musculoskeletal: No acute osseous findings. IMPRESSION: 1. Status post right hemicolectomy and reanastomosis. 2. Interval enlargement of a posterior aortocaval lymph node, concerning for worsened nodal metastatic disease. 3. No other evidence of metastatic disease in the chest, abdomen, or pelvis. 4. Diffuse bilateral bronchial wall thickening with a background of fine centrilobular pulmonary nodules, most concentrated in the lung apices. Findings are most consistent with smoking-related respiratory bronchiolitis. 5. Cardiomegaly. Enlargement of the main pulmonary artery, as can be seen in pulmonary hypertension. 6. Prostatomegaly. 7. Coarse, nodular contour of the liver suggesting cirrhosis. Aortic Atherosclerosis (ICD10-I70.0). Electronically Signed   By: ADelanna AhmadiM.D.   On: 12/07/2022 21:27     Assessment/Plan Chemotherapy-induced peripheral neuropathy ([place]  RBONNIE RICCELLIpresents with clinical syndrome consistent with symmetric, length dependent, small and large fiber peripheral neuropathy.  Etiology is exposure to oxaliplatin chemotherapy.  We reviewed pathophysiology of chemotherapy  induced neuropathy, available treatments, and goals of care.    Because he lacks pain, paresthesias his benefit from neuropathic pain medications will be limited.  He primarily experiences numbness, sensory deficit.  We reviewed options for medications, Lyrica, Cymbalta.  He declined at this time.  We did discuss lifestyle interventions, dietary changes, to aid with recovery from chemo neuropathy.  We spent twenty additional minutes teaching regarding the natural history, biology, and historical experience in the treatment of neurologic complications of cancer.   We appreciate the opportunity to participate in the care of RSERAFIM KIESEWETTER  He will let us know if he would like to try a new neuropathy medicine.  All questions were answered. The Chris knows to call the clinic with any problems, questions or concerns. No barriers to learning were detected.  The total time spent in the encounter was 30 minutes and more than 50% was on counseling and review of test results   Ventura Sellers, MD Medical Director of Neuro-Oncology [facility] [place] 01/04/23 11:22 AM

## 2023-01-16 ENCOUNTER — Other Ambulatory Visit: Payer: Medicare HMO | Admitting: Urology

## 2023-01-22 ENCOUNTER — Ambulatory Visit: Payer: Medicare HMO | Admitting: Urology

## 2023-01-30 ENCOUNTER — Ambulatory Visit (INDEPENDENT_AMBULATORY_CARE_PROVIDER_SITE_OTHER): Payer: Medicare HMO | Admitting: Urology

## 2023-01-30 ENCOUNTER — Encounter: Payer: Self-pay | Admitting: Urology

## 2023-01-30 VITALS — BP 120/78 | HR 75 | Ht 69.0 in | Wt 216.0 lb

## 2023-01-30 DIAGNOSIS — C61 Malignant neoplasm of prostate: Secondary | ICD-10-CM

## 2023-01-30 DIAGNOSIS — R972 Elevated prostate specific antigen [PSA]: Secondary | ICD-10-CM

## 2023-01-30 MED ORDER — GENTAMICIN SULFATE 40 MG/ML IJ SOLN
80.0000 mg | Freq: Once | INTRAMUSCULAR | Status: AC
  Administered 2023-01-30: 80 mg via INTRAMUSCULAR

## 2023-01-30 MED ORDER — LEVOFLOXACIN 500 MG PO TABS
500.0000 mg | ORAL_TABLET | Freq: Once | ORAL | Status: AC
  Administered 2023-01-30: 500 mg via ORAL

## 2023-01-30 NOTE — Patient Instructions (Signed)

## 2023-01-30 NOTE — Progress Notes (Signed)
   01/30/23  Indication: Elevated PSA(42).  Also has a history of colon cancer with increasing CEA, as well as a 2 cm aortocaval node suspicious for metastatic disease of unclear primary  Prostate Biopsy Procedure   Informed consent was obtained, and we discussed the risks of bleeding and infection/sepsis. A time out was performed to ensure correct patient identity.  Pre-Procedure: - Last PSA Level: 42 - Gentamicin and levaquin given for antibiotic prophylaxis - Transrectal Ultrasound performed revealing a 53 gm prostate, PSA density 0.79 - Hypoechoic regions bilaterally, no median lobe  Procedure: - Prostate block performed using 10 cc 1% lidocaine and biopsies taken from sextant areas, a total of 12 under ultrasound guidance.  Post-Procedure: - Patient tolerated the procedure well - He was counseled to seek immediate medical attention if experiences significant bleeding, fevers, or severe pain - Return in one week to discuss biopsy results  Assessment/ Plan: Will follow up in 1-2 weeks to discuss pathology  Nickolas Madrid, MD 01/30/2023

## 2023-01-31 ENCOUNTER — Ambulatory Visit
Admission: EM | Admit: 2023-01-31 | Discharge: 2023-01-31 | Disposition: A | Discharge status: Home / Self Care | Payer: Medicare HMO | Attending: Family Medicine | Admitting: Family Medicine

## 2023-01-31 ENCOUNTER — Encounter: Payer: Self-pay | Admitting: Oncology

## 2023-01-31 DIAGNOSIS — M109 Gout, unspecified: Secondary | ICD-10-CM | POA: Diagnosis not present

## 2023-01-31 MED ORDER — COLCHICINE 0.6 MG PO TABS: 0.6000 mg | ORAL_TABLET | Freq: Every day | ORAL | 1 refills | Status: DC

## 2023-01-31 MED ORDER — DEXAMETHASONE SODIUM PHOSPHATE 10 MG/ML IJ SOLN
10.0000 mg | Freq: Once | INTRAMUSCULAR | Status: AC
  Administered 2023-01-31: 10 mg via INTRAMUSCULAR

## 2023-01-31 NOTE — Discharge Instructions (Addendum)
Resume taking allopurinol after your gout flare has up subsided.  Do keep a log to see what your gout triggers are.  You were given a steroid injection here which should help your pain.  Stop by the pharmacy to pick up your colchicine. Monitor your blood pressure and your blood sugars as you were given a steroid injection and this may cause your blood sugars and pressure to go up.

## 2023-01-31 NOTE — ED Triage Notes (Signed)
Patient presents to [facility] for bilateral ankle pain since 2 days ago. Left ankle worse. States hx of gout. Took his allopurinol.

## 2023-01-31 NOTE — ED Provider Notes (Signed)
[facility]    CSN: [id number] Arrival date & time: 01/31/23  1448      History   Chief Complaint Chief Complaint  Patient presents with   Foot Pain    HPI  HPI KOSTANTINOS SOKOLIK is a 67 y.o. male.   Albus presents for bilateral foot pain that started 2-3 days ago. Took allopurinol without relief. He can't put much weight on the left one. He believes he has another gout flare.  He is unsure what flares his gout up. He started cherry juice to try to prevent flareups.  He has cut back on red meat, seafood denies alcohol use.     Past Medical History:  Diagnosis Date   A-fib ([place])    Anemia    Arthritis    ankles   Cancer ([place])    multiple myeloma   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[place]) 10/26/2019   Cardiomyopathy ([place])    Chemotherapy-induced peripheral neuropathy (HCC)    CHF (congestive heart failure) (HCC)    Depression    Dysrhythmia    atrial fib   HLD (hyperlipidemia)    Hypercholesteremia    Hypertension    Moderate tricuspid insufficiency    Multiple myeloma (HCC)    not being treated right now per pt 11/11/19   Pneumonia    Sleep apnea    No CPAP.  OSA resolved with wt loss.    Patient Active Problem List   Diagnosis Date Noted   Special screening for malignant neoplasms, colon    Personal history of colon cancer    Chemotherapy-induced peripheral neuropathy ([place]) 02/16/2022   Diabetes mellitus type 2, controlled, without complications ([place]) [id number]   Difficulty walking 08/30/2020   Numbness 08/30/2020   Neuropathy 08/30/2020   Port-A-Cath in place    Abnormal ECG 11/04/2019   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[place]) 10/27/2019   Neoplasm of digestive system    Intraoperative partial intestinal obstruction (HCC)    Acute esophagogastric ulcer    Goals of care, counseling/discussion 05/18/2019   Multiple myeloma ([place]) 05/17/2019   Anemia 05/12/2019   Arthritis of left ankle 02/05/2018   Cardiomyopathy, idiopathic ([place]) 01/07/2018   Enlarged  heart 01/01/2017   Moderate tricuspid insufficiency 01/01/2017   OSA (obstructive sleep apnea) 01/01/2017   Chronic a-fib ([place]) 12/06/2015   Pure hypercholesterolemia 02/09/2015   Type 2 diabetes mellitus without complication ([place]) [id number]   Anticoagulant long-term use 04/29/2014   Hyperlipidemia 04/29/2014   Hypertension, essential, benign 04/26/2014    Past Surgical History:  Procedure Laterality Date   ATRIAL ABLATION SURGERY     CARDIAC SURGERY     A-Fib Ablations   COLONOSCOPY WITH PROPOFOL N/A 10/26/2019   Procedure: COLONOSCOPY WITH BIOPSY;  Surgeon: Lucilla Lame, MD;  Location: [facility];  Service: Endoscopy;  Laterality: N/A;   COLONOSCOPY WITH PROPOFOL N/A 09/03/2022   Procedure: COLONOSCOPY WITH PROPOFOL;  Surgeon: Lucilla Lame, MD;  Location: [facility];  Service: Endoscopy;  Laterality: N/A;   ESOPHAGOGASTRODUODENOSCOPY (EGD) WITH PROPOFOL N/A 10/26/2019   Procedure: ESOPHAGOGASTRODUODENOSCOPY (EGD) WITH BIOPSY;  Surgeon: Lucilla Lame, MD;  Location: [facility];  Service: Endoscopy;  Laterality: N/A;  sleep apnea   LAPAROSCOPIC PARTIAL COLECTOMY Right 11/17/2019   Procedure: LAPAROSCOPIC PARTIAL COLECTOMY RIGHT EXTENDED;  Surgeon: Olean Ree, MD;  Location: [facility];  Service: General;  Laterality: Right;   PORTACATH PLACEMENT N/A 12/28/2019   Procedure: INSERTION PORT-A-CATH Left subclavian;  Surgeon: Olean Ree, MD;  Location: [facility];  Service:  General;  Laterality: N/A;       Home Medications    Prior to Admission medications   Medication Sig Start Date End Date Taking? Authorizing Provider  allopurinol (ZYLOPRIM) 100 MG tablet Take 100 mg by mouth daily. 11/01/22 11/01/23  [provider]  APPLE CIDER VINEGAR PO Take 30-45 mLs by mouth every other day.     [provider]  aspirin 325 MG EC tablet Take 325 mg by mouth every other day. In the morning    [provider]  colchicine 0.6 MG tablet Take  1 tablet (0.6 mg total) by mouth daily. 01/31/23   Kyandra Mcclaine, Ronnette Juniper, DO  digoxin (LANOXIN) 0.25 MG tablet Take 0.25 mg by mouth daily.     [provider]  furosemide (LASIX) 80 MG tablet Take 80 mg by mouth daily. Morning & late afternoon    [provider]  MAGNESIUM PO Take by mouth.    [provider]  Misc Natural Products (TART CHERRY ADVANCED PO) Take 60 mLs by mouth every other day.    [provider]  Multiple Vitamins-Minerals (ZINC PO) Take by mouth.    [provider]  potassium bicarbonate (K-LYTE) 25 MEQ disintegrating tablet Take 25 mEq by mouth 4 (four) times a week. In the afternoon.    [provider]  Zinc Sulfate (ZINC 15 PO) Take by mouth.    [provider]    Family History Family History  Problem Relation Age of Onset   Healthy Mother     Social History Social History   Tobacco Use   Smoking status: Never    Passive exposure: Never   Smokeless tobacco: Never  Vaping Use   Vaping Use: Never used  Substance Use Topics   Alcohol use: Not Currently   Drug use: Never     Allergies   Patient has no known allergies.   Review of Systems Review of Systems: :negative unless otherwise stated in HPI.      Physical Exam Triage Vital Signs ED Triage Vitals  Enc Vitals Group     BP 01/31/23 1503 123/75     Pulse Rate 01/31/23 1503 79     Resp 01/31/23 1503 18     Temp 01/31/23 1503 98.3 F (36.8 C)     Temp Source 01/31/23 1503 Oral     SpO2 01/31/23 1503 95 %     Weight --      Height --      Head Circumference --      Peak Flow --      Pain Score 01/31/23 1502 8     Pain Loc --      Pain Edu? --      Excl. in [place]? --    No data found.  Updated Vital Signs BP 123/75 (BP Location: Left Arm)   Pulse 79   Temp 98.3 F (36.8 C) (Oral)   Resp 18   SpO2 95%   Visual Acuity Right Eye Distance:   Left Eye Distance:   Bilateral Distance:    Right Eye Near:   Left Eye Near:     Bilateral Near:     Physical Exam GEN: well appearing male in no acute distress  CVS: well perfused  RESP: speaking in full sentences without pause, no respiratory distress  MSK: ankles Inspection:  mild erythema and edema on right but moderate on the left, warmth without ecchymosis or bony deformity, pes planus Palpation: Tenderness to palpation lateral malleoli  ROM: Full active and passive range of motion Strength: 5/5 in all directions No ligamentous laxity No pain at the base of the fifth metatarsal Able to ambulate with pain Special Tests: Negative anterior and posterior drawer  Neurovascularly intact, no instability noted   UC Treatments / Results  Labs (all labs ordered are listed, but only abnormal results are displayed) Labs Reviewed - No data to display  EKG   Radiology No results found.   Procedures Procedures (including critical care time)  Medications Ordered in UC Medications  dexamethasone (DECADRON) injection 10 mg (10 mg Intramuscular Given 01/31/23 1554)    Initial Impression / Assessment and Plan / UC Course  I have reviewed the triage vital signs and the nursing notes.  Pertinent labs & imaging results that were available during my care of the patient were reviewed by me and considered in my medical decision making (see chart for details).      Pt is a 67 y.o.  male with history of gout, multiple myeloma and hx colon cancer presents for 2 days of bilateral redness and edema that is worse on the left.  On exam, he has erythema, edema (L >R)  with warmth with tenderness around his lateral malleolus on the left. Uric acid level was elevated (11.4) at a previous UC visit.  I suspect he is having another gout flare.  Dexamethasone 10 mg given tonight as this helps him most..  Prescription for colchicine sent to his pharmacy.  He would like to avoid prescription steroids as they tend to increase his blood sugars the most.  He is taking allopurinol 100 mg  for prevention.   Patient to gradually return to normal activities, as tolerated and continue ordinary activities within the limits permitted by pain.  Follow with PCP as needed.  Return and ED precautions given. Understanding voiced. Discussed MDM, treatment plan and plan for follow-up with patient who agrees with plan.     Final Clinical Impressions(s) / UC Diagnoses   Final diagnoses:  Acute gout of multiple sites, unspecified cause     Discharge Instructions      Resume taking allopurinol after your gout flare has up subsided.  Do keep a log to see what your gout triggers are.  You were given a steroid injection here which should help your pain.  Stop by the pharmacy to pick up your colchicine. Monitor your blood pressure and your blood sugars as you were given a steroid injection and this may cause your blood sugars and pressure to go up.     ED Prescriptions     Medication Sig Dispense Auth. Provider   colchicine 0.6 MG tablet Take 1 tablet (0.6 mg total) by mouth daily. 9 tablet Laken Lobato, Ronnette Juniper, DO      PDMP not reviewed this encounter.   Lyndee Hensen, DO 01/31/23 2029

## 2023-02-01 LAB — SURGICAL PATHOLOGY

## 2023-02-12 ENCOUNTER — Encounter: Payer: Self-pay | Admitting: Urology

## 2023-02-12 ENCOUNTER — Ambulatory Visit (INDEPENDENT_AMBULATORY_CARE_PROVIDER_SITE_OTHER): Payer: Medicare HMO | Admitting: Urology

## 2023-02-12 VITALS — BP 118/77 | HR 106 | Ht 69.0 in | Wt 217.0 lb

## 2023-02-12 DIAGNOSIS — C61 Malignant neoplasm of prostate: Secondary | ICD-10-CM | POA: Diagnosis not present

## 2023-02-12 NOTE — Patient Instructions (Addendum)
Unfortunately your prostate biopsy did show prostate cancer.  Your CT scan from February did show a suspicious 2 cm lymph node in your upper abdomen, we cannot tell just from the CT if this is related to a recurrence of your prior colon cancer, or the prostate cancer.  The next step is to determine what this lymph node represents, as this will help [place] determine a treatment strategy.  I will reach out to Dr. Orlie Dakin, and we will get back to you regarding options and next steps.

## 2023-02-12 NOTE — Progress Notes (Signed)
   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  I recommended a prostate MRI but he never followed up.  PSA continued to increase, most recently 42, and he ultimately underwent a prostate biopsy on 01/30/2023.  Prostate biopsy showed all cores involved with Gleason score 3+4=7, with high-volume and 100% max core involvement in multiple cores, and perineural invasion was present.  He was seen in late January by his oncologist Dr. Orlie Dakin, and CEA had also increased to 117 from 18 prior.  I personally viewed and interpreted the CT chest abdomen and pelvis and bone scan that was ordered by Dr. Orlie Dakin, which shows no bony metastatic disease, however an enlarged 2 cm aortocaval lymph node worrisome for metastatic disease, likely either colon or prostate primary.  We had a long conversation today about treatment options, and the complexity of his case with prior colon cancer, rising CEA, and suspicious lymph node of unclear primary.  I have added him to tumor board, as well as message Dr. Jaynee Eagles about options moving forward.  If this lymph node is not able to be biopsied, he may benefit from a PSMA PET scan.  We also discussed more palliative options, but he is interested in any treatment options available.  Will discuss his case at tumor board with history of colon cancer with rising PSA, new diagnosis of high-volume prostate cancer with a 2 cm aortocaval node of unclear primary.   I spent 45 total minutes on the day of the encounter including pre-visit review of the medical record, face-to-face time with the patient, and post visit ordering of labs/imaging/tests.  Sondra Come, MD  [facility] [address], [address] [place], [place] [address] [phone]

## 2023-02-21 ENCOUNTER — Other Ambulatory Visit: Payer: Medicare HMO

## 2023-02-21 ENCOUNTER — Telehealth: Payer: Self-pay | Admitting: *Deleted

## 2023-02-21 ENCOUNTER — Other Ambulatory Visit: Payer: Self-pay | Admitting: *Deleted

## 2023-02-21 DIAGNOSIS — C9000 Multiple myeloma not having achieved remission: Secondary | ICD-10-CM

## 2023-02-21 DIAGNOSIS — C184 Malignant neoplasm of transverse colon: Secondary | ICD-10-CM

## 2023-02-21 DIAGNOSIS — R972 Elevated prostate specific antigen [PSA]: Secondary | ICD-10-CM

## 2023-02-21 NOTE — Telephone Encounter (Signed)
Nurse placed call to patient to update on plan of care discussed at tumor board. Per Dr. Orlie Dakin patient will need to be scheduled for lab only visit this week if possible and schedule for MD follow up next week for follow up. Patient verbalized understanding of plan. Lab orders placed for tomorrow and scheduling message sent.

## 2023-02-22 ENCOUNTER — Inpatient Hospital Stay: Payer: Medicare HMO | Attending: Oncology

## 2023-02-22 DIAGNOSIS — G629 Polyneuropathy, unspecified: Secondary | ICD-10-CM | POA: Diagnosis not present

## 2023-02-22 DIAGNOSIS — C9000 Multiple myeloma not having achieved remission: Secondary | ICD-10-CM

## 2023-02-22 DIAGNOSIS — C61 Malignant neoplasm of prostate: Secondary | ICD-10-CM | POA: Diagnosis present

## 2023-02-22 DIAGNOSIS — C184 Malignant neoplasm of transverse colon: Secondary | ICD-10-CM | POA: Diagnosis not present

## 2023-02-22 DIAGNOSIS — D72819 Decreased white blood cell count, unspecified: Secondary | ICD-10-CM | POA: Insufficient documentation

## 2023-02-22 DIAGNOSIS — D649 Anemia, unspecified: Secondary | ICD-10-CM | POA: Insufficient documentation

## 2023-02-22 DIAGNOSIS — D696 Thrombocytopenia, unspecified: Secondary | ICD-10-CM | POA: Diagnosis not present

## 2023-02-22 DIAGNOSIS — D472 Monoclonal gammopathy: Secondary | ICD-10-CM | POA: Insufficient documentation

## 2023-02-22 DIAGNOSIS — R972 Elevated prostate specific antigen [PSA]: Secondary | ICD-10-CM

## 2023-02-22 DIAGNOSIS — N289 Disorder of kidney and ureter, unspecified: Secondary | ICD-10-CM | POA: Insufficient documentation

## 2023-02-22 LAB — CBC WITH DIFFERENTIAL/PLATELET
Abs Immature Granulocytes: 0 10*3/uL (ref 0.00–0.07)
Basophils Absolute: 0 10*3/uL (ref 0.0–0.1)
Basophils Relative: 0 %
Eosinophils Absolute: 0.1 10*3/uL (ref 0.0–0.5)
Eosinophils Relative: 3 %
HCT: 35.6 % — ABNORMAL LOW (ref 39.0–52.0)
Hemoglobin: 11.5 g/dL — ABNORMAL LOW (ref 13.0–17.0)
Immature Granulocytes: 0 %
Lymphocytes Relative: 20 %
Lymphs Abs: 0.6 10*3/uL — ABNORMAL LOW (ref 0.7–4.0)
MCH: 28.5 pg (ref 26.0–34.0)
MCHC: 32.3 g/dL (ref 30.0–36.0)
MCV: 88.3 fL (ref 80.0–100.0)
Monocytes Absolute: 0.4 10*3/uL (ref 0.1–1.0)
Monocytes Relative: 13 %
Neutro Abs: 1.8 10*3/uL (ref 1.7–7.7)
Neutrophils Relative %: 64 %
Platelets: 126 10*3/uL — ABNORMAL LOW (ref 150–400)
RBC: 4.03 MIL/uL — ABNORMAL LOW (ref 4.22–5.81)
RDW: 13.6 % (ref 11.5–15.5)
WBC: 2.8 10*3/uL — ABNORMAL LOW (ref 4.0–10.5)
nRBC: 0 % (ref 0.0–0.2)

## 2023-02-22 LAB — CMP (CANCER CENTER ONLY)
ALT: 30 U/L (ref 0–44)
AST: 42 U/L — ABNORMAL HIGH (ref 15–41)
Albumin: 3.8 g/dL (ref 3.5–5.0)
Alkaline Phosphatase: 143 U/L — ABNORMAL HIGH (ref 38–126)
Anion gap: 9 (ref 5–15)
BUN: 18 mg/dL (ref 8–23)
CO2: 27 mmol/L (ref 22–32)
Calcium: 8.8 mg/dL — ABNORMAL LOW (ref 8.9–10.3)
Chloride: 99 mmol/L (ref 98–111)
Creatinine: 1.82 mg/dL — ABNORMAL HIGH (ref 0.61–1.24)
GFR, Estimated: 40 mL/min — ABNORMAL LOW (ref >60)
Glucose, Bld: 161 mg/dL — ABNORMAL HIGH (ref 70–99)
Potassium: 3.5 mmol/L (ref 3.5–5.1)
Sodium: 135 mmol/L (ref 135–145)
Total Bilirubin: 0.9 mg/dL (ref 0.3–1.2)
Total Protein: 8 g/dL (ref 6.5–8.1)

## 2023-02-22 LAB — PSA: Prostatic Specific Antigen: 39.34 ng/mL — ABNORMAL HIGH (ref 0.00–4.00)

## 2023-02-23 LAB — CEA: CEA: 142 ng/mL — ABNORMAL HIGH (ref 0.0–4.7)

## 2023-02-24 LAB — IGG, IGA, IGM
IgA: 75 mg/dL (ref 61–437)
IgG (Immunoglobin G), Serum: 2344 mg/dL — ABNORMAL HIGH (ref 603–1613)
IgM (Immunoglobulin M), Srm: 82 mg/dL (ref 20–172)

## 2023-02-25 LAB — KAPPA/LAMBDA LIGHT CHAINS
Kappa free light chain: 36.2 mg/L — ABNORMAL HIGH (ref 3.3–19.4)
Kappa, lambda light chain ratio: 1.55 (ref 0.26–1.65)
Lambda free light chains: 23.4 mg/L (ref 5.7–26.3)

## 2023-02-26 LAB — PROTEIN ELECTROPHORESIS, SERUM
A/G Ratio: 0.9 (ref 0.7–1.7)
Albumin ELP: 3.6 g/dL (ref 2.9–4.4)
Alpha-1-Globulin: 0.3 g/dL (ref 0.0–0.4)
Alpha-2-Globulin: 0.6 g/dL (ref 0.4–1.0)
Beta Globulin: 1.1 g/dL (ref 0.7–1.3)
Gamma Globulin: 1.9 g/dL — ABNORMAL HIGH (ref 0.4–1.8)
Globulin, Total: 3.9 g/dL (ref 2.2–3.9)
M-Spike, %: 1.6 g/dL — ABNORMAL HIGH
Total Protein ELP: 7.5 g/dL (ref 6.0–8.5)

## 2023-02-28 ENCOUNTER — Encounter: Payer: Self-pay | Admitting: Oncology

## 2023-02-28 ENCOUNTER — Encounter: Payer: Self-pay | Admitting: Emergency Medicine

## 2023-02-28 ENCOUNTER — Ambulatory Visit
Admission: EM | Admit: 2023-02-28 | Discharge: 2023-02-28 | Disposition: A | Discharge status: Home / Self Care | Payer: Medicare HMO | Attending: Family Medicine | Admitting: Family Medicine

## 2023-02-28 DIAGNOSIS — M109 Gout, unspecified: Secondary | ICD-10-CM

## 2023-02-28 MED ORDER — DEXAMETHASONE SODIUM PHOSPHATE 10 MG/ML IJ SOLN
10.0000 mg | Freq: Once | INTRAMUSCULAR | Status: AC
  Administered 2023-02-28: 10 mg via INTRAMUSCULAR

## 2023-02-28 MED ORDER — COLCHICINE 0.6 MG PO TABS: 0.6000 mg | ORAL_TABLET | Freq: Every day | ORAL | 2 refills | Status: DC

## 2023-02-28 NOTE — Discharge Instructions (Addendum)
Resume taking allopurinol after your gout flare has up subsided.  Do keep a log to see what your gout triggers are.  You were given a steroid injection here which should help your pain.  Stop by the pharmacy to pick up your colchicine. Monitor your blood pressure and your blood sugars as you were given a steroid injection and this may cause your blood sugars and pressure to go up. 

## 2023-02-28 NOTE — ED Triage Notes (Signed)
Pt presents with right elbow pain which he believes is gout since yesterday.

## 2023-03-01 ENCOUNTER — Telehealth: Payer: Self-pay

## 2023-03-01 ENCOUNTER — Encounter: Payer: Self-pay | Admitting: Oncology

## 2023-03-01 ENCOUNTER — Inpatient Hospital Stay (HOSPITAL_BASED_OUTPATIENT_CLINIC_OR_DEPARTMENT_OTHER): Payer: Medicare HMO | Admitting: Oncology

## 2023-03-01 VITALS — BP 109/70 | HR 62 | Temp 97.1°F | Resp 16 | Ht 69.0 in | Wt 217.9 lb

## 2023-03-01 DIAGNOSIS — C184 Malignant neoplasm of transverse colon: Secondary | ICD-10-CM

## 2023-03-01 DIAGNOSIS — R972 Elevated prostate specific antigen [PSA]: Secondary | ICD-10-CM

## 2023-03-01 DIAGNOSIS — C9000 Multiple myeloma not having achieved remission: Secondary | ICD-10-CM | POA: Diagnosis not present

## 2023-03-01 MED ORDER — PREGABALIN 50 MG PO CAPS: 50.0000 mg | ORAL_CAPSULE | Freq: Every day | ORAL | 1 refills | Status: DC

## 2023-03-01 NOTE — Telephone Encounter (Signed)
PA approved.

## 2023-03-01 NOTE — Telephone Encounter (Signed)
PA initiated for lyrica Key: [id number]. Waiting on response.

## 2023-03-01 NOTE — Progress Notes (Signed)
Has tried gabapentin in the past which did not help with neuropathy. Would like to talk about alternatives that could help.

## 2023-03-01 NOTE — Progress Notes (Signed)
[facility]  [phone] [phone]) [phone]  ID: Chris George OB: 12-10-1955  MR#: [id number]  AOZ#:[id number]  Patient Care Team: Marina Goodell, MD as PCP - General (Family Medicine) [name], MD as Consulting Physician (Oncology) Morene Crocker, MD as Referring Physician (Neurology) Lamar Blinks, MD as Consulting Physician (Cardiology)  CHIEF COMPLAINT: Recurrent colon cancer, smoldering myeloma.  INTERVAL HISTORY: Patient returns to clinic today for further evaluation and discussion of his laboratory and imaging results.  He continues to have significant peripheral neuropathy, but otherwise feels well.  He has no other neurologic complaints.  He denies any recent fevers or illnesses.  He has a good appetite and denies weight loss.  He has no chest pain, shortness of breath, cough, or hemoptysis.  He denies any nausea, vomiting, constipation, or diarrhea.  He has no melena or hematochezia.  He has no urinary complaints.  Patient offers no further specific complaints today.  REVIEW OF SYSTEMS:   Review of Systems  Constitutional: Negative.  Negative for fever, malaise/fatigue and weight loss.  Respiratory: Negative.  Negative for cough, hemoptysis and shortness of breath.   Cardiovascular: Negative.  Negative for chest pain and leg swelling.  Gastrointestinal: Negative.  Negative for abdominal pain, blood in stool and melena.  Genitourinary: Negative.  Negative for hematuria.  Musculoskeletal: Negative.  Negative for back pain and joint pain.  Skin: Negative.  Negative for rash.  Neurological:  Positive for tingling and sensory change. Negative for dizziness, focal weakness, weakness and headaches.  Psychiatric/Behavioral: Negative.  Negative for depression. The patient is not nervous/anxious.     As per HPI. Otherwise, a complete review of systems is negative.  PAST MEDICAL HISTORY: Past Medical History:  Diagnosis Date    A-fib (HCC)    Anemia    Arthritis    ankles   Cancer (HCC)    multiple myeloma   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HCC) 10/26/2019   Cardiomyopathy (HCC)    Chemotherapy-induced peripheral neuropathy (HCC)    CHF (congestive heart failure) (HCC)    Depression    Dysrhythmia    atrial fib   HLD (hyperlipidemia)    Hypercholesteremia    Hypertension    Moderate tricuspid insufficiency    Multiple myeloma (HCC)    not being treated right now per pt 11/11/19   Pneumonia    Sleep apnea    No CPAP.  OSA resolved with wt loss.    PAST SURGICAL HISTORY: Past Surgical History:  Procedure Laterality Date   ATRIAL ABLATION SURGERY     CARDIAC SURGERY     A-Fib Ablations   COLONOSCOPY WITH PROPOFOL N/A 10/26/2019   Procedure: COLONOSCOPY WITH BIOPSY;  Surgeon: [name], MD;  Location[facility];  Service: Endoscopy;  Laterality: N/A;   COLONOSCOPY WITH PROPOFOL N/A 09/03/2022   Procedure: COLONOSCOPY WITH PROPOFOL;  Surgeon: [name], MD;  Location: [facility];  Service: Endoscopy;  Laterality: N/A;   ESOPHAGOGASTRODUODENOSCOPY (EGD) WITH PROPOFOL N/A 10/26/2019   Procedure: ESOPHAGOGASTRODUODENOSCOPY (EGD) WITH BIOPSY;  Surgeon: [name], MD;  Location: [facility];  Service: Endoscopy;  Laterality: N/A;  sleep apnea   LAPAROSCOPIC PARTIAL COLECTOMY Right 11/17/2019   Procedure: LAPAROSCOPIC PARTIAL COLECTOMY RIGHT EXTENDED;  Surgeon: [name], MD;  Location: [facility];  Service: General;  Laterality: Right;   PORTACATH PLACEMENT N/A 12/28/2019   Procedure: INSERTION PORT-A-CATH Left subclavian;  Surgeon: [name], MD;  Location: [facility];  Service: General;  Laterality:  N/A;    FAMILY HISTORY: Family History  Problem Relation Age of Onset   Healthy Mother     ADVANCED DIRECTIVES (Y/N):  N  HEALTH MAINTENANCE: Social History   Tobacco Use   Smoking status: Never    Passive exposure: Never   Smokeless tobacco: Never  Vaping Use    Vaping Use: Never used  Substance Use Topics   Alcohol use: Not Currently   Drug use: Never     Colonoscopy:  PAP:  Bone density:  Lipid panel:  No Known Allergies  Current Outpatient Medications  Medication Sig Dispense Refill   allopurinol (ZYLOPRIM) 100 MG tablet Take 100 mg by mouth daily.     APPLE CIDER VINEGAR PO Take 30-45 mLs by mouth every other day.      aspirin 325 MG EC tablet Take 325 mg by mouth every other day. In the morning     colchicine 0.6 MG tablet Take 1 tablet (0.6 mg total) by mouth daily. 9 tablet 2   digoxin (LANOXIN) 0.25 MG tablet Take 0.25 mg by mouth daily.      furosemide (LASIX) 80 MG tablet Take 80 mg by mouth daily. Morning & late afternoon     MAGNESIUM PO Take by mouth.     Misc Natural Products (TART CHERRY ADVANCED PO) Take 60 mLs by mouth every other day.     Multiple Vitamins-Minerals (ZINC PO) Take by mouth.     potassium bicarbonate (K-LYTE) 25 MEQ disintegrating tablet Take 25 mEq by mouth 4 (four) times a week. In the afternoon.     pregabalin (LYRICA) 50 MG capsule Take 1 capsule (50 mg total) by mouth daily. 90 capsule 1   Zinc Sulfate (ZINC 15 PO) Take by mouth.     No current facility-administered medications for this visit.    OBJECTIVE: Vitals:   03/01/23 0921  BP: 109/70  Pulse: 62  Resp: 16  Temp: (!) 97.1 F (36.2 C)  SpO2: 99%      Body mass index is 32.18 kg/m.    ECOG FS:0 - Asymptomatic  General: Well-developed, well-nourished, no acute distress. Eyes: Pink conjunctiva, anicteric sclera. HEENT: Normocephalic, moist mucous membranes. Lungs: No audible wheezing or coughing. Heart: Regular rate and rhythm. Abdomen: Soft, nontender, no obvious distention. Musculoskeletal: No edema, cyanosis, or clubbing. Neuro: Alert, answering all questions appropriately. Cranial nerves grossly intact. Skin: No rashes or petechiae noted. Psych: Normal affect.  LAB RESULTS:  Lab Results  Component Value Date   NA 135  02/22/2023   K 3.5 02/22/2023   CL 99 02/22/2023   CO2 27 02/22/2023   GLUCOSE 161 (H) 02/22/2023   BUN 18 02/22/2023   CREATININE 1.82 (H) 02/22/2023   CALCIUM 8.8 (L) 02/22/2023   PROT 8.0 02/22/2023   ALBUMIN 3.8 02/22/2023   AST 42 (H) 02/22/2023   ALT 30 02/22/2023   ALKPHOS 143 (H) 02/22/2023   BILITOT 0.9 02/22/2023   GFRNONAA 40 (L) 02/22/2023   GFRAA >60 07/29/2020    Lab Results  Component Value Date   WBC 2.8 (L) 02/22/2023   NEUTROABS 1.8 02/22/2023   HGB 11.5 (L) 02/22/2023   HCT 35.6 (L) 02/22/2023   MCV 88.3 02/22/2023   PLT 126 (L) 02/22/2023   Lab Results  Component Value Date   IRON 41 (L) 05/11/2020   TIBC 323 05/11/2020   IRONPCTSAT 13 (L) 05/11/2020   Lab Results  Component Value Date   FERRITIN 86 05/11/2020     STUDIES: No  results found.   ASSESSMENT: Stage IIIc colon cancer, smoldering myeloma, prostate cancer.  PLAN:    Stage IIIc colon cancer: Patient completed cycle 9 of adjuvant FOLFOX on June 03, 2020.  Given his difficulties with treatment and declining performance status, treatment was discontinued altogether. Colonoscopy on September 03, 2022 did not reveal any significant pathology and recommendation was to repeat in 3 years.  CT scan from December 07, 2022 reviewed independently with enlarging aortocaval node now measuring 1.9 x 1.4 cm.  CEA also continues to increase and is now 142.0.  There is no other evidence of disease.  Unclear if enlarged lymph node is related to recurrent colon cancer or possibly related to his newly diagnosed prostate cancer.  Unable to biopsy given its location, therefore will get a PET scan for further evaluation.  Return to clinic 2 to 3 days after his imaging to discuss the results and treatment/diagnostic planning. Prostate cancer: Gleason's 3+4.  Patient's PSA is increased, but relatively stable at 39.34.  Imaging as above.  Nuclear medicine bone scan on December 13, 2022 was reported as negative.  Unclear  if enlarged lymph node is prostate cancer or colon cancer.  PET scan as above.  Patient may benefit from Eligard treatments in the future. Smoldering myeloma: Chronic and unchanged.  Patient was initially diagnosed and treated in [place], [place] and treated with single agent Velcade in approximately 2013.  He declined maintenance treatment or referral for bone marrow transplant.  His M spike has ranged from 1.1-2.0 since July 2020.  His most recent result was 1.6.  Over the same timeframe his IgG component has ranged from [phone].  His most recent result is 2344.  Finally, his kappa free light chains have ranged from 24.8 -75.3 with his most recent result reported at 36.2.  Nuclear med bone scan as above.  His most recent bone marrow biopsy completed on May 28, 2019 revealed only 10 to 15% plasma cells with no clonality reported.  Cytogenetics were also reported as normal.  Continue to monitor closely.  Anemia: Chronic and unchanged.  Patient's most recent hemoglobin is 11.5.  He last received IV Feraheme on October 06, 2019.   Renal insufficiency: Creatinine is trended up slightly to 1.82.  Continue follow-up with nephrology as indicated.   Peripheral neuropathy: Patient reports gabapentin did not help.  He was initiated on Lyrica 50 mg daily and given a referral to neurology.   Leukopenia: Mild, monitor. Thrombocytopenia: Chronic and unchanged.  Patient's most recent platelet count was 126.     Patient expressed understanding and was in agreement with this plan. He also understands that He can call clinic at any time with any questions, concerns, or complaints.    [name], MD   03/01/2023 11:44 AM

## 2023-03-05 NOTE — ED Provider Notes (Signed)
[facility]    CSN: [id number] Arrival date & time: 02/28/23  1259      History   Chief Complaint Chief Complaint  Patient presents with   Elbow Pain    HPI  HPI Chris George is a 67 y.o. male.   Chris George presents for right elbow pain that started yesterday. Has chronic gout and takes allopurinol.He is unable to straighten his right arm out all the way. It is starting to swell.  He believes he has another gout flare.  He is unsure what flares his gout up. Notes that he was diagnosed with prostate cancer recently. He has history of multiple myeloma and colon cancer. He follows with oncology.      Past Medical History:  Diagnosis Date   A-fib (HCC)    Anemia    Arthritis    ankles   Cancer (HCC)    multiple myeloma   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HCC) 10/26/2019   Cardiomyopathy (HCC)    Chemotherapy-induced peripheral neuropathy (HCC)    CHF (congestive heart failure) (HCC)    Depression    Dysrhythmia    atrial fib   HLD (hyperlipidemia)    Hypercholesteremia    Hypertension    Moderate tricuspid insufficiency    Multiple myeloma (HCC)    not being treated right now per pt 11/11/19   Pneumonia    Sleep apnea    No CPAP.  OSA resolved with wt loss.    Patient Active Problem List   Diagnosis Date Noted   Special screening for malignant neoplasms, colon    Personal history of colon cancer    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   Diabetes mellitus type 2, controlled, without complications (HCC) 08/07/2021   Difficulty walking 08/30/2020   Numbness 08/30/2020   Neuropathy 08/30/2020   Port-A-Cath in place    Abnormal ECG 11/04/2019   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HCC) 10/27/2019   Neoplasm of digestive system    Intraoperative partial intestinal obstruction (HCC)    Acute esophagogastric ulcer    Goals of care, counseling/discussion 05/18/2019   Multiple myeloma (HCC) 05/17/2019   Anemia 05/12/2019   Arthritis of left  ankle 02/05/2018   Cardiomyopathy, idiopathic (HCC) 01/07/2018   Enlarged heart 01/01/2017   Moderate tricuspid insufficiency 01/01/2017   OSA (obstructive sleep apnea) 01/01/2017   Chronic a-fib (HCC) 12/06/2015   Pure hypercholesterolemia 02/09/2015   Type 2 diabetes mellitus without complication (HCC) 02/09/2015   Anticoagulant long-term use 04/29/2014   Hyperlipidemia 04/29/2014   Hypertension, essential, benign 04/26/2014    Past Surgical History:  Procedure Laterality Date   ATRIAL ABLATION SURGERY     CARDIAC SURGERY     A-Fib Ablations   COLONOSCOPY WITH PROPOFOL N/A 10/26/2019   Procedure: COLONOSCOPY WITH BIOPSY;  Surgeon: [name], MD;  Location: [facility];  Service: Endoscopy;  Laterality: N/A;   COLONOSCOPY WITH PROPOFOL N/A 09/03/2022   Procedure: COLONOSCOPY WITH PROPOFOL;  Surgeon: [name], MD;  Location: [facility];  Service: Endoscopy;  Laterality: N/A;   ESOPHAGOGASTRODUODENOSCOPY (EGD) WITH PROPOFOL N/A 10/26/2019   Procedure: ESOPHAGOGASTRODUODENOSCOPY (EGD) WITH BIOPSY;  Surgeon: [name], MD;  Location: [facility];  Service: Endoscopy;  Laterality: N/A;  sleep apnea   LAPAROSCOPIC PARTIAL COLECTOMY Right 11/17/2019   Procedure: LAPAROSCOPIC PARTIAL COLECTOMY RIGHT EXTENDED;  Surgeon: [name], MD;  Location: [facility];  Service: General;  Laterality: Right;   PORTACATH PLACEMENT N/A 12/28/2019   Procedure: INSERTION PORT-A-CATH Left subclavian;  Surgeon: [name], MD;  Location: [facility];  Service: General;  Laterality: N/A;       Home Medications    Prior to Admission medications   Medication Sig Start Date End Date Taking? Authorizing Provider  allopurinol (ZYLOPRIM) 100 MG tablet Take 100 mg by mouth daily. 11/01/22 11/01/23  [provider]  APPLE CIDER VINEGAR PO Take 30-45 mLs by mouth every other day.     [provider]  aspirin 325 MG EC tablet Take 325 mg by mouth every other day.  In the morning    [provider]  colchicine 0.6 MG tablet Take 1 tablet (0.6 mg total) by mouth daily. 02/28/23   [name], Seward Meth, DO  digoxin (LANOXIN) 0.25 MG tablet Take 0.25 mg by mouth daily.     [provider]  furosemide (LASIX) 80 MG tablet Take 80 mg by mouth daily. Morning & late afternoon    [provider]  MAGNESIUM PO Take by mouth.    [provider]  Misc Natural Products (TART CHERRY ADVANCED PO) Take 60 mLs by mouth every other day.    [provider]  Multiple Vitamins-Minerals (ZINC PO) Take by mouth.    [provider]  potassium bicarbonate (K-LYTE) 25 MEQ disintegrating tablet Take 25 mEq by mouth 4 (four) times a week. In the afternoon.    [provider]  pregabalin (LYRICA) 50 MG capsule Take 1 capsule (50 mg total) by mouth daily. 03/01/23   [name], MD  Zinc Sulfate (ZINC 15 PO) Take by mouth.    [provider]    Family History Family History  Problem Relation Age of Onset   Healthy Mother     Social History Social History   Tobacco Use   Smoking status: Never    Passive exposure: Never   Smokeless tobacco: Never  Vaping Use   Vaping Use: Never used  Substance Use Topics   Alcohol use: Not Currently   Drug use: Never     Allergies   Patient has no known allergies.   Review of Systems Review of Systems: :negative unless otherwise stated in HPI.      Physical Exam Triage Vital Signs ED Triage Vitals  Enc Vitals Group     BP 01/31/23 1503 123/75     Pulse Rate 01/31/23 1503 79     Resp 01/31/23 1503 18     Temp 01/31/23 1503 98.3 F (36.8 C)     Temp Source 01/31/23 1503 Oral     SpO2 01/31/23 1503 95 %     Weight --      Height --      Head Circumference --      Peak Flow --      Pain Score 01/31/23 1502 8     Pain Loc --      Pain Edu? --      Excl. in GC? --    No data found.  Updated Vital Signs BP 103/65 (BP Location: Left Arm)   Pulse  66   Temp (!) 97.5 F (36.4 C) (Oral)   Resp 16   SpO2 97%   Visual Acuity Right Eye Distance:   Left Eye Distance:   Bilateral Distance:    Right Eye Near:   Left Eye Near:    Bilateral Near:     Physical Exam GEN: well appearing male in no acute distress  CVS: well perfused  RESP: speaking in full sentences without pause,  no respiratory distress  MSK: right elbow Inspection:  mild edema on right with warmth without erythema, ecchymosis or bony deformity Palpation: Tenderness to palpation lateral epicondyles ROM: limited extension of right elbow due to edema and pain  Strength: not assessed as pt not tolerating elbow pressure Neurovascularly intact, no instability noted   UC Treatments / Results  Labs (all labs ordered are listed, but only abnormal results are displayed) Labs Reviewed - No data to display  EKG   Radiology No results found.   Procedures Procedures (including critical care time)  Medications Ordered in UC Medications  dexamethasone (DECADRON) injection 10 mg (10 mg Intramuscular Given 02/28/23 1409)    Initial Impression / Assessment and Plan / UC Course  I have reviewed the triage vital signs and the nursing notes.  Pertinent labs & imaging results that were available during my care of the patient were reviewed by me and considered in my medical decision making (see chart for details).      Pt is a 66 y.o.male with history of chronic gout, prostate cancer, stable multiple myeloma and hx colon cancer presents for acute gout flare of right elbow.   On exam, he has mild edema, warmth with tenderness around his right elbow. On chart review, uric acid level was elevated (11.4) at a previous UC visit.  I suspect he is having another gout flare.  Dexamethasone 10 mg given tonight as this helps him most..  Prescription for colchicine sent to his pharmacy.  He would like to avoid prescription steroids as they tend to increase his blood sugars the most.   He is taking allopurinol for prevention.    Patient to gradually return to normal activities, as tolerated and continue ordinary activities within the limits permitted by pain.  Follow with PCP/oncologist as needed.  Return and ED precautions given. Understanding voiced. Discussed MDM, treatment plan and plan for follow-up with patient who agrees with plan.     Final Clinical Impressions(s) / UC Diagnoses   Final diagnoses:  Acute gout of right elbow, unspecified cause     Discharge Instructions      Resume taking allopurinol after your gout flare has up subsided.  Do keep a log to see what your gout triggers are.   You were given a steroid injection here which should help your pain.  Stop by the pharmacy to pick up your colchicine. Monitor your blood pressure and your blood sugars as you were given a steroid injection and this may cause your blood sugars and pressure to go up.     ED Prescriptions     Medication Sig Dispense Auth. Provider   colchicine 0.6 MG tablet Take 1 tablet (0.6 mg total) by mouth daily. 9 tablet Kim Oki, Seward Meth, DO      PDMP not reviewed this encounter.     Katha Cabal, DO 03/05/23 2159

## 2023-03-08 ENCOUNTER — Encounter: Payer: Self-pay | Admitting: Oncology

## 2023-03-12 ENCOUNTER — Inpatient Hospital Stay: Payer: Medicare HMO | Attending: Oncology

## 2023-03-12 ENCOUNTER — Ambulatory Visit
Admission: RE | Admit: 2023-03-12 | Discharge: 2023-03-12 | Disposition: A | Discharge status: Home / Self Care | Payer: Medicare HMO | Source: Ambulatory Visit | Attending: Oncology | Admitting: Oncology

## 2023-03-12 DIAGNOSIS — I700 Atherosclerosis of aorta: Secondary | ICD-10-CM | POA: Diagnosis not present

## 2023-03-12 DIAGNOSIS — C9000 Multiple myeloma not having achieved remission: Secondary | ICD-10-CM | POA: Diagnosis present

## 2023-03-12 DIAGNOSIS — Z8572 Personal history of non-Hodgkin lymphomas: Secondary | ICD-10-CM | POA: Diagnosis not present

## 2023-03-12 DIAGNOSIS — D649 Anemia, unspecified: Secondary | ICD-10-CM | POA: Diagnosis not present

## 2023-03-12 DIAGNOSIS — I517 Cardiomegaly: Secondary | ICD-10-CM | POA: Diagnosis not present

## 2023-03-12 DIAGNOSIS — D696 Thrombocytopenia, unspecified: Secondary | ICD-10-CM | POA: Diagnosis not present

## 2023-03-12 DIAGNOSIS — C61 Malignant neoplasm of prostate: Secondary | ICD-10-CM | POA: Diagnosis present

## 2023-03-12 DIAGNOSIS — C786 Secondary malignant neoplasm of retroperitoneum and peritoneum: Secondary | ICD-10-CM | POA: Insufficient documentation

## 2023-03-12 DIAGNOSIS — R972 Elevated prostate specific antigen [PSA]: Secondary | ICD-10-CM

## 2023-03-12 DIAGNOSIS — C184 Malignant neoplasm of transverse colon: Secondary | ICD-10-CM | POA: Insufficient documentation

## 2023-03-12 LAB — PSA: Prostatic Specific Antigen: 32.84 ng/mL — ABNORMAL HIGH (ref 0.00–4.00)

## 2023-03-12 LAB — GLUCOSE, CAPILLARY: Glucose-Capillary: 155 mg/dL — ABNORMAL HIGH (ref 70–99)

## 2023-03-12 MED ORDER — FLUDEOXYGLUCOSE F - 18 (FDG) INJECTION
12.4500 | Freq: Once | INTRAVENOUS | Status: AC | PRN
  Administered 2023-03-12: 12.45 via INTRAVENOUS

## 2023-03-13 ENCOUNTER — Institutional Professional Consult (permissible substitution): Payer: Medicare HMO | Admitting: Radiation Oncology

## 2023-03-14 LAB — CEA: CEA: 194 ng/mL — ABNORMAL HIGH (ref 0.0–4.7)

## 2023-03-15 ENCOUNTER — Ambulatory Visit: Payer: Medicare HMO | Admitting: Oncology

## 2023-03-18 ENCOUNTER — Ambulatory Visit
Admission: RE | Admit: 2023-03-18 | Discharge: 2023-03-18 | Disposition: A | Discharge status: Home / Self Care | Payer: Medicare HMO | Source: Ambulatory Visit | Attending: Radiation Oncology | Admitting: Radiation Oncology

## 2023-03-18 ENCOUNTER — Encounter: Payer: Self-pay | Admitting: Oncology

## 2023-03-18 ENCOUNTER — Inpatient Hospital Stay (HOSPITAL_BASED_OUTPATIENT_CLINIC_OR_DEPARTMENT_OTHER): Payer: Medicare HMO | Admitting: Oncology

## 2023-03-18 VITALS — BP 130/67 | HR 94 | Temp 96.6°F | Resp 18 | Ht 69.0 in | Wt 220.5 lb

## 2023-03-18 DIAGNOSIS — C184 Malignant neoplasm of transverse colon: Secondary | ICD-10-CM

## 2023-03-18 DIAGNOSIS — C61 Malignant neoplasm of prostate: Secondary | ICD-10-CM | POA: Diagnosis not present

## 2023-03-18 MED ORDER — LIDOCAINE-PRILOCAINE 2.5-2.5 % EX CREA
TOPICAL_CREAM | CUTANEOUS | 3 refills | Status: DC
Start: 2023-03-18 — End: 2023-04-23

## 2023-03-18 NOTE — Progress Notes (Signed)
[facility]  [phone] [phone]  ID: Chris George OB: 1956/05/08  MR#: [id number]  ION#:[id number]  Patient Care Team: Marina Goodell, MD as PCP - General (Family Medicine) [name], MD as Consulting Physician (Oncology) Morene Crocker, MD as Referring Physician (Neurology) Lamar Blinks, MD as Consulting Physician (Cardiology)  CHIEF COMPLAINT: Recurrent colon cancer, smoldering myeloma, prostate cancer.  INTERVAL HISTORY: Patient returns to clinic today for further evaluation and discussion of his laboratory and PET scan results.  He continues to have a chronic peripheral neuropathy, but otherwise feels well.  He has no other neurologic complaints.  He denies any recent fevers or illnesses.  He has a good appetite and denies weight loss.  He has no chest pain, shortness of breath, cough, or hemoptysis.  He denies any nausea, vomiting, constipation, or diarrhea.  He has no melena or hematochezia.  He has no urinary complaints.  Patient offers no further specific complaints today.  REVIEW OF SYSTEMS:   Review of Systems  Constitutional: Negative.  Negative for fever, malaise/fatigue and weight loss.  Respiratory: Negative.  Negative for cough, hemoptysis and shortness of breath.   Cardiovascular: Negative.  Negative for chest pain and leg swelling.  Gastrointestinal: Negative.  Negative for abdominal pain, blood in stool and melena.  Genitourinary: Negative.  Negative for hematuria.  Musculoskeletal: Negative.  Negative for back pain and joint pain.  Skin: Negative.  Negative for rash.  Neurological:  Positive for tingling and sensory change. Negative for dizziness, focal weakness, weakness and headaches.  Psychiatric/Behavioral: Negative.  Negative for depression. The patient is not nervous/anxious.     As per HPI. Otherwise, a complete review of systems is negative.  PAST MEDICAL HISTORY: Past Medical History:   Diagnosis Date   A-fib (HCC)    Anemia    Arthritis    ankles   Cancer (HCC)    multiple myeloma   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HCC) 10/26/2019   Cardiomyopathy (HCC)    Chemotherapy-induced peripheral neuropathy (HCC)    CHF (congestive heart failure) (HCC)    Depression    Dysrhythmia    atrial fib   HLD (hyperlipidemia)    Hypercholesteremia    Hypertension    Moderate tricuspid insufficiency    Multiple myeloma (HCC)    not being treated right now per pt 11/11/19   Pneumonia    Sleep apnea    No CPAP.  OSA resolved with wt loss.    PAST SURGICAL HISTORY: Past Surgical History:  Procedure Laterality Date   ATRIAL ABLATION SURGERY     CARDIAC SURGERY     A-Fib Ablations   COLONOSCOPY WITH PROPOFOL N/A 10/26/2019   Procedure: COLONOSCOPY WITH BIOPSY;  Surgeon: [name], MD;  Location: [facility];  Service: Endoscopy;  Laterality: N/A;   COLONOSCOPY WITH PROPOFOL N/A 09/03/2022   Procedure: COLONOSCOPY WITH PROPOFOL;  Surgeon: [name], MD;  Location: [facility];  Service: Endoscopy;  Laterality: N/A;   ESOPHAGOGASTRODUODENOSCOPY (EGD) WITH PROPOFOL N/A 10/26/2019   Procedure: ESOPHAGOGASTRODUODENOSCOPY (EGD) WITH BIOPSY;  Surgeon: [name], MD;  Location: [facility];  Service: Endoscopy;  Laterality: N/A;  sleep apnea   LAPAROSCOPIC PARTIAL COLECTOMY Right 11/17/2019   Procedure: LAPAROSCOPIC PARTIAL COLECTOMY RIGHT EXTENDED;  Surgeon: [name], MD;  Location: [facility];  Service: General;  Laterality: Right;   PORTACATH PLACEMENT N/A 12/28/2019   Procedure: INSERTION PORT-A-CATH Left subclavian;  Surgeon: [name], MD;  Location: [facility];  Service: General;  Laterality: N/A;    FAMILY HISTORY: Family History  Problem Relation Age of Onset   Healthy Mother     ADVANCED DIRECTIVES (Y/N):  N  HEALTH MAINTENANCE: Social History   Tobacco Use   Smoking status: Never    Passive exposure: Never   Smokeless tobacco:  Never  Vaping Use   Vaping Use: Never used  Substance Use Topics   Alcohol use: Not Currently   Drug use: Never     Colonoscopy:  PAP:  Bone density:  Lipid panel:  No Known Allergies  Current Outpatient Medications  Medication Sig Dispense Refill   allopurinol (ZYLOPRIM) 100 MG tablet Take 100 mg by mouth daily.     APPLE CIDER VINEGAR PO Take 30-45 mLs by mouth every other day.      aspirin 325 MG EC tablet Take 325 mg by mouth every other day. In the morning     colchicine 0.6 MG tablet Take 1 tablet (0.6 mg total) by mouth daily. 9 tablet 2   digoxin (LANOXIN) 0.25 MG tablet Take 0.25 mg by mouth daily.      furosemide (LASIX) 80 MG tablet Take 80 mg by mouth daily. Morning & late afternoon     MAGNESIUM PO Take by mouth.     Misc Natural Products (TART CHERRY ADVANCED PO) Take 60 mLs by mouth every other day.     Multiple Vitamins-Minerals (ZINC PO) Take by mouth.     potassium bicarbonate (K-LYTE) 25 MEQ disintegrating tablet Take 25 mEq by mouth 4 (four) times a week. In the afternoon.     Zinc Sulfate (ZINC 15 PO) Take by mouth.     pregabalin (LYRICA) 50 MG capsule Take 1 capsule (50 mg total) by mouth daily. (Patient not taking: Reported on 03/18/2023) 90 capsule 1   No current facility-administered medications for this visit.    OBJECTIVE: Vitals:   03/18/23 1028  BP: 130/67  Pulse: 94  Resp: 18  Temp: (!) 96.6 F (35.9 C)  SpO2: 99%      Body mass index is 32.56 kg/m.    ECOG FS:0 - Asymptomatic  General: Well-developed, well-nourished, no acute distress. Eyes: Pink conjunctiva, anicteric sclera. HEENT: Normocephalic, moist mucous membranes. Lungs: No audible wheezing or coughing. Heart: Regular rate and rhythm. Abdomen: Soft, nontender, no obvious distention. Musculoskeletal: No edema, cyanosis, or clubbing. Neuro: Alert, answering all questions appropriately. Cranial nerves grossly intact. Skin: No rashes or petechiae noted. Psych: Normal  affect.  LAB RESULTS:  Lab Results  Component Value Date   NA 135 02/22/2023   K 3.5 02/22/2023   CL 99 02/22/2023   CO2 27 02/22/2023   GLUCOSE 161 (H) 02/22/2023   BUN 18 02/22/2023   CREATININE 1.82 (H) 02/22/2023   CALCIUM 8.8 (L) 02/22/2023   PROT 8.0 02/22/2023   ALBUMIN 3.8 02/22/2023   AST 42 (H) 02/22/2023   ALT 30 02/22/2023   ALKPHOS 143 (H) 02/22/2023   BILITOT 0.9 02/22/2023   GFRNONAA 40 (L) 02/22/2023   GFRAA >60 07/29/2020    Lab Results  Component Value Date   WBC 2.8 (L) 02/22/2023   NEUTROABS 1.8 02/22/2023   HGB 11.5 (L) 02/22/2023   HCT 35.6 (L) 02/22/2023   MCV 88.3 02/22/2023   PLT 126 (L) 02/22/2023   Lab Results  Component Value Date   IRON 41 (L) 05/11/2020   TIBC 323 05/11/2020   IRONPCTSAT 13 (L) 05/11/2020   Lab Results  Component Value Date  FERRITIN 86 05/11/2020     STUDIES: NM PET Image Restag (PS) Skull Base To Thigh  Result Date: 03/15/2023 CLINICAL DATA:  Initial treatment strategy for newly diagnosed prostate cancer. History of colon cancer (2020). History of multiple myeloma. History of lymphoma, last chemotherapy 2022. EXAM: NUCLEAR MEDICINE PET SKULL BASE TO THIGH TECHNIQUE: 12.5 mCi F-18 FDG was injected intravenously. Full-ring PET imaging was performed from the skull base to thigh after the radiotracer. CT data was obtained and used for attenuation correction and anatomic localization. Fasting blood glucose: 155 mg/dl COMPARISON:  Whole-body bone scan dated 12/13/2022. CT chest abdomen pelvis dated 01/01/2023. PET-CT dated 06/22/2021. FINDINGS: Mediastinal blood pool activity: SUV max 2.5 Liver activity: SUV max NA NECK: No hypermetabolic cervical lymphadenopathy. Incidental CT findings: None. CHEST: No hypermetabolic pulmonary nodules. No hyperbolic thoracic lymphadenopathy. Left chest port terminates in the upper SVC. Incidental CT findings: Cardiomegaly. Mild left lower lobe atelectasis. ABDOMEN/PELVIS: 13 mm short axis  posterior aortocaval node (series 4/image 108), grossly unchanged from the prior, max SUV 14.0. Adjacent 12 mm short axis right retrocaval node (series 4/image 106), new. No abnormal hypermetabolism in the liver, spleen, pancreas, or adrenal glands. No focal hypermetabolism in the prostate in this patient with newly diagnosed prostate cancer. Incidental CT findings: Mild atherosclerotic calcifications of the aortic arch and branch vessels. SKELETON: No focal hypermetabolic activity to suggest skeletal metastasis. Incidental CT findings: Mild degenerative changes of the visualized thoracolumbar spine. IMPRESSION: Progressive/recurrent retroperitoneal metastases, presumably related to the patient's colon cancer when correlating with prior studies, as above. No focal hypermetabolism in the prostate or associated metastatic disease in this patient with newly diagnosed prostate cancer. Electronically Signed   By: Charline Bills M.D.   On: 03/15/2023 02:02     ASSESSMENT: Stage IIIc colon cancer, smoldering myeloma, prostate cancer.  PLAN:    Stage IIIc colon cancer: Patient completed cycle 9 of adjuvant FOLFOX on June 03, 2020.  Given his difficulties with treatment and declining performance status, treatment was discontinued altogether. Colonoscopy on September 03, 2022 did not reveal any significant pathology and recommendation was to repeat in 3 years.  CT scan from December 07, 2022 reviewed independently with enlarging aortocaval node now measuring 1.9 x 1.4 cm.  PET scan results from Mar 15, 2023 reviewed independently and reported as above with progressive retroperitoneal lymphadenopathy presumably related to patient's colon cancer.  His CEA also continues to trend up and is now 194.  Patient has agreed to reinitiate chemotherapy with FOLFIRI.  Radiation oncology has ordered a PSMA PET scan to further delineate lymphadenopathy and will await these results prior to scheduling treatment.  Prostate cancer:  Gleason's 3+4.  Patient's PSA is increased, but relatively stable at 32.4.  Imaging as above.  Nuclear medicine bone scan on December 13, 2022 was reported as negative.  PSMA PET per radiation oncology as above.  If negative, they plan to do local IMRT to his prostate gland.   Smoldering myeloma: Chronic and unchanged.  Patient was initially diagnosed and treated in [place], [place] and treated with single agent Velcade in approximately 2013.  He declined maintenance treatment or referral for bone marrow transplant.  His M spike has ranged from 1.1-2.0 since July 2020.  His most recent result was 1.6.  Over the same timeframe his IgG component has ranged from 570-777-6659.  His most recent result is 2344.  Finally, his kappa free light chains have ranged from 24.8 -75.3 with his most recent result reported at 36.2.  Nuclear med bone scan as above.  His most recent bone marrow biopsy completed on May 28, 2019 revealed only 10 to 15% plasma cells with no clonality reported.  Cytogenetics were also reported as normal.  Continue to monitor closely.  Anemia: Chronic and unchanged.  Patient's most recent hemoglobin is 11.5.  He last received IV Feraheme on October 06, 2019.   Renal insufficiency: Creatinine is trended up slightly to 1.82.  Continue follow-up with nephrology as indicated.   Peripheral neuropathy: Patient reports gabapentin did not help.  He was recently initiated on Lyrica 50 mg daily and given a referral to neurology.   Leukopenia: Mild, monitor. Thrombocytopenia: Chronic and unchanged.  Patient's most recent platelet count was 126.    I spent a total of 30 minutes reviewing chart data, face-to-face evaluation with the patient, counseling and coordination of care as detailed above.    Patient expressed understanding and was in agreement with this plan. He also understands that He can call clinic at any time with any questions, concerns, or complaints.    [name], MD   03/18/2023  10:51 AM

## 2023-03-18 NOTE — Progress Notes (Signed)
Radiation Oncology Follow up Note  Name: Chris George   Date:   03/18/2023 MRN:  [id number] DOB: 04-09-1956    This 67 y.o. male presents to the clinic today for evaluation of prostate cancer in patient previously treated with SBRT for intra-abdominal node in patient with known locally advanced adenocarcinoma.  REFERRING PROVIDER: Marina Goodell, MD  HPI: Patient is a 67 year old male well-known to our department.  Received SBRT for.Intra-abdominal lymph node metastasis from known locally advanced adenocarcinoma of the colon.  Patient also has myeloma all being followed by Dr. Orlie Dakin.  His initial colon cancer stage IIIc and he completed 9 cycles of adjuvant FOLFOX.  He is now developed CEA increased now to 142 and CT scan showed evidence of enlarging aortocaval nodes.  He is also been diagnosed with a Gleason 7 adenocarcinoma of the prostate (3+4 with a PSA of 40.  He had a bone scan in February showing no evidence of bone metastasis.  Recently had a PET CT scan showing progressive and recurrent retroperitoneal metastasis.  I am asked to evaluate him for his prostate cancer.  He is asymptomatic specifically denies increased lower urinary tract symptoms except some urgency.  He is having no rectal pain at this time.  Patient recently had PSA of 40 rectal exams showed bilateral involvement.  Labs showed out of 12 cores sampled 11 had Gleason 7 (3+4) adenocarcinoma.  He is stage IIIc prostate cancer at this time  COMPLICATIONS OF TREATMENT: none  FOLLOW UP COMPLIANCE: keeps appointments   PHYSICAL EXAM:  There were no vitals taken for this visit. Well-developed well-nourished patient in NAD. HEENT reveals PERLA, EOMI, discs not visualized.  Oral cavity is clear. No oral mucosal lesions are identified. Neck is clear without evidence of cervical or supraclavicular adenopathy. Lungs are clear to A&P. Cardiac examination is essentially unremarkable with regular rate and rhythm without murmur rub  or thrill. Abdomen is benign with no organomegaly or masses noted. Motor sensory and DTR levels are equal and symmetric in the upper and lower extremities. Cranial nerves II through XII are grossly intact. Proprioception is intact. No peripheral adenopathy or edema is identified. No motor or sensory levels are noted. Crude visual fields are within normal range.  RADIOLOGY RESULTS: PET CT scan reviewed PSMA PET CT scan ordered  PLAN: At this time I am ordering a PSMA PET scan hopefully that will be approved.  This will help [place] discern whether we are dealing with locally advanced prostate cancer versus metastatic colon cancer.  Should there be no signs of lymph node involvement of his prostate cancer I would recommend IMRT radiation therapy to his prostate 80 Gray over 8 weeks.  Risks and benefits of treatment including increased lower urinary tract symptoms diarrhea fatigue increased lower urinary tract symptoms alteration of blood counts all were discussed in detail with the patient.  I will see the patient back after his PSMA PET scan and make final recommendations at that time.  Patient comprehends my recommendations well.  I would like to take this opportunity to thank you for allowing me to participate in the care of your patient.Carmina Miller, MD

## 2023-03-18 NOTE — Progress Notes (Signed)
DISCONTINUE ON PATHWAY REGIMEN - Colorectal     A cycle is every 14 days:     Oxaliplatin      Leucovorin      Fluorouracil      Fluorouracil   **Always confirm dose/schedule in your pharmacy ordering system**  REASON: Disease Progression PRIOR TREATMENT: COS67: [id number] q14 Days x 6 Months TREATMENT RESPONSE: Complete Response (CR)  START ON PATHWAY REGIMEN - Colorectal     A cycle is every 14 days:     Irinotecan      Leucovorin      Fluorouracil      Fluorouracil   **Always confirm dose/schedule in your pharmacy ordering system**  Patient Characteristics: Distant Metastases, Nonsurgical Candidate, Non-KRAS G12C, RAS Mutation Positive/Unknown (BRAF V600 Wild-Type/Unknown), Standard Cytotoxic Therapy, Second Line Standard Cytotoxic Therapy, Bevacizumab Ineligible Tumor Location: Colon Therapeutic Status: Distant Metastases Microsatellite/Mismatch Repair Status: Unknown BRAF Mutation Status: Did Not Order Test KRAS/NRAS Mutation Status: Did Not Order Test Preferred Therapy Approach: Standard Cytotoxic Therapy Standard Cytotoxic Line of Therapy: Second Line Standard Cytotoxic Therapy Bevacizumab Eligibility: Ineligible Intent of Therapy: Non-Curative / Palliative Intent, Discussed with Patient

## 2023-03-19 ENCOUNTER — Other Ambulatory Visit: Payer: Self-pay

## 2023-03-20 ENCOUNTER — Other Ambulatory Visit: Payer: Self-pay

## 2023-03-21 ENCOUNTER — Ambulatory Visit
Admission: RE | Admit: 2023-03-21 | Discharge: 2023-03-21 | Disposition: A | Discharge status: Home / Self Care | Payer: Medicare HMO | Source: Ambulatory Visit | Attending: Radiation Oncology | Admitting: Radiation Oncology

## 2023-03-21 ENCOUNTER — Telehealth: Payer: Self-pay | Admitting: *Deleted

## 2023-03-21 DIAGNOSIS — C61 Malignant neoplasm of prostate: Secondary | ICD-10-CM | POA: Insufficient documentation

## 2023-03-21 DIAGNOSIS — I2510 Atherosclerotic heart disease of native coronary artery without angina pectoris: Secondary | ICD-10-CM | POA: Diagnosis not present

## 2023-03-21 DIAGNOSIS — I517 Cardiomegaly: Secondary | ICD-10-CM | POA: Diagnosis not present

## 2023-03-21 DIAGNOSIS — Z85038 Personal history of other malignant neoplasm of large intestine: Secondary | ICD-10-CM | POA: Insufficient documentation

## 2023-03-21 DIAGNOSIS — I700 Atherosclerosis of aorta: Secondary | ICD-10-CM | POA: Diagnosis not present

## 2023-03-21 DIAGNOSIS — K766 Portal hypertension: Secondary | ICD-10-CM | POA: Insufficient documentation

## 2023-03-21 DIAGNOSIS — K7460 Unspecified cirrhosis of liver: Secondary | ICD-10-CM | POA: Diagnosis not present

## 2023-03-21 DIAGNOSIS — Z8572 Personal history of non-Hodgkin lymphomas: Secondary | ICD-10-CM | POA: Insufficient documentation

## 2023-03-21 MED ORDER — PIFLIFOLASTAT F 18 (PYLARIFY) INJECTION
9.0000 | Freq: Once | INTRAVENOUS | Status: AC
  Administered 2023-03-21: 9.46 via INTRAVENOUS

## 2023-03-21 NOTE — Telephone Encounter (Signed)
error 

## 2023-03-22 ENCOUNTER — Telehealth: Payer: Self-pay | Admitting: Radiation Oncology

## 2023-03-22 ENCOUNTER — Telehealth: Payer: Self-pay | Admitting: *Deleted

## 2023-03-22 NOTE — Telephone Encounter (Signed)
Patient called with questions about appointment.

## 2023-03-22 NOTE — Telephone Encounter (Signed)
Patient requesting a return call to discuss treatment as he just found out that he is to have radiation therapy and chemotherapy and no one has told him about this. Please return his call.

## 2023-03-23 ENCOUNTER — Other Ambulatory Visit: Payer: Self-pay

## 2023-03-25 ENCOUNTER — Encounter: Payer: Self-pay | Admitting: Oncology

## 2023-03-25 ENCOUNTER — Ambulatory Visit
Admission: RE | Admit: 2023-03-25 | Discharge: 2023-03-25 | Disposition: A | Discharge status: Home / Self Care | Payer: Medicare HMO | Source: Ambulatory Visit | Attending: Radiation Oncology | Admitting: Radiation Oncology

## 2023-03-25 DIAGNOSIS — C61 Malignant neoplasm of prostate: Secondary | ICD-10-CM | POA: Diagnosis present

## 2023-03-28 ENCOUNTER — Other Ambulatory Visit: Payer: Self-pay | Admitting: Oncology

## 2023-03-28 DIAGNOSIS — C184 Malignant neoplasm of transverse colon: Secondary | ICD-10-CM

## 2023-03-29 ENCOUNTER — Other Ambulatory Visit: Payer: Self-pay | Admitting: *Deleted

## 2023-03-29 DIAGNOSIS — C61 Malignant neoplasm of prostate: Secondary | ICD-10-CM

## 2023-03-30 ENCOUNTER — Ambulatory Visit
Admission: EM | Admit: 2023-03-30 | Discharge: 2023-03-30 | Disposition: A | Discharge status: Home / Self Care | Payer: Medicare HMO | Attending: Family Medicine | Admitting: Family Medicine

## 2023-03-30 ENCOUNTER — Encounter: Payer: Self-pay | Admitting: Oncology

## 2023-03-30 DIAGNOSIS — M109 Gout, unspecified: Secondary | ICD-10-CM

## 2023-03-30 MED ORDER — COLCHICINE 0.6 MG PO TABS: 0.6000 mg | ORAL_TABLET | Freq: Two times a day (BID) | ORAL | 1 refills | Status: DC

## 2023-03-30 MED ORDER — DEXAMETHASONE SODIUM PHOSPHATE 10 MG/ML IJ SOLN
10.0000 mg | Freq: Once | INTRAMUSCULAR | Status: AC
  Administered 2023-03-30: 10 mg via INTRAMUSCULAR

## 2023-03-30 NOTE — Discharge Instructions (Addendum)
You need to follow up with your PCP.  Your diabetes is uncontrolled.   Discuss increase in your Allopurinol with your specialists.  You need to see nephrology again as well.

## 2023-03-30 NOTE — ED Triage Notes (Signed)
Gout flare up x 3 days left foot

## 2023-03-30 NOTE — ED Provider Notes (Signed)
[facility]    CSN: [id number] Arrival date & time: 03/30/23  1446      History   Chief Complaint Chief Complaint  Patient presents with   Gout    HPI Chris George is a 67 y.o. male with an extensive past medical history as outlined below presents with gout.  He states that he is experiencing significant pain and swelling of his left ankle.  This has been going on for the past 3 days.  Patient has had recent gout flares.  His uric acid level has been markedly elevated.  This does not appear to have been addressed by his specialist or his primary care physician.  His diabetes is also uncontrolled.  He is currently on no treatment for this.  He has chronic kidney disease last GFR 40.  He states that injection of dexamethasone seems to be what works for him the best.  Colchicine also helps.  He would like these today.  I have advised him that he needs to follow-up closely with his physicians.  He needs titration of allopurinol and close monitoring of chronic kidney disease.  He also needs pharmacotherapy regarding his type 2 diabetes which is uncontrolled.   Past Medical History:  Diagnosis Date   A-fib (HCC)    Anemia    Arthritis    ankles   Cancer (HCC)    multiple myeloma   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HCC) 10/26/2019   Cardiomyopathy (HCC)    Chemotherapy-induced peripheral neuropathy (HCC)    CHF (congestive heart failure) (HCC)    Depression    Dysrhythmia    atrial fib   HLD (hyperlipidemia)    Hypercholesteremia    Hypertension    Moderate tricuspid insufficiency    Multiple myeloma (HCC)    not being treated right now per pt 11/11/19   Pneumonia    Sleep apnea    No CPAP.  OSA resolved with wt loss.    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   Diabetes mellitus type 2, controlled, without complications (HCC) 08/07/2021   Neuropathy  08/30/2020   Port-A-Cath in place    Abnormal ECG 11/04/2019   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HCC) 10/27/2019   Goals of care, counseling/discussion 05/18/2019   Multiple myeloma (HCC) 05/17/2019   Anemia 05/12/2019   Arthritis of left ankle 02/05/2018   Cardiomyopathy, idiopathic (HCC) 01/07/2018   Moderate tricuspid insufficiency 01/01/2017   OSA (obstructive sleep apnea) 01/01/2017   Chronic a-fib (HCC) 12/06/2015   Pure hypercholesterolemia 02/09/2015   Anticoagulant long-term use 04/29/2014   Hyperlipidemia 04/29/2014   Hypertension, essential, benign 04/26/2014    Past Surgical History:  Procedure Laterality Date   ATRIAL ABLATION SURGERY     CARDIAC SURGERY     A-Fib Ablations   COLONOSCOPY WITH PROPOFOL N/A 10/26/2019   Procedure: COLONOSCOPY WITH BIOPSY;  Surgeon: [name], MD;  Location: [facility];  Service: Endoscopy;  Laterality: N/A;   COLONOSCOPY WITH PROPOFOL N/A 09/03/2022   Procedure: COLONOSCOPY WITH PROPOFOL;  Surgeon: [name], MD;  Location: [facility];  Service: Endoscopy;  Laterality: N/A;   ESOPHAGOGASTRODUODENOSCOPY (EGD) WITH PROPOFOL N/A 10/26/2019   Procedure: ESOPHAGOGASTRODUODENOSCOPY (EGD) WITH BIOPSY;  Surgeon: [name], MD;  Location: [facility];  Service: Endoscopy;  Laterality: N/A;  sleep apnea   LAPAROSCOPIC PARTIAL COLECTOMY Right 11/17/2019   Procedure: LAPAROSCOPIC PARTIAL COLECTOMY RIGHT EXTENDED;  Surgeon: [name], MD;  Location: [facility];  Service: General;  Laterality: Right;   PORTACATH PLACEMENT N/A 12/28/2019   Procedure: INSERTION PORT-A-CATH Left subclavian;  Surgeon: [name], MD;  Location: [facility];  Service: General;  Laterality: N/A;       Home Medications    Prior to Admission medications   Medication Sig Start Date End Date Taking? Authorizing Provider  allopurinol (ZYLOPRIM) 100 MG tablet Take 100 mg by mouth daily. 11/01/22 11/01/23 Yes [provider]  APPLE  CIDER VINEGAR PO Take 30-45 mLs by mouth every other day.    Yes [provider]  aspirin 325 MG EC tablet Take 325 mg by mouth every other day. In the morning   Yes [provider]  digoxin (LANOXIN) 0.25 MG tablet Take 0.25 mg by mouth daily.    Yes [provider]  furosemide (LASIX) 80 MG tablet Take 80 mg by mouth daily. Morning & late afternoon   Yes [provider]  lidocaine-prilocaine (EMLA) cream Apply to affected area once 03/18/23  Yes Finnegan, Tollie Pizza, MD  MAGNESIUM PO Take by mouth.   Yes [provider]  Misc Natural Products (TART CHERRY ADVANCED PO) Take 60 mLs by mouth every other day.   Yes [provider]  Multiple Vitamins-Minerals (ZINC PO) Take by mouth.   Yes [provider]  potassium bicarbonate (K-LYTE) 25 MEQ disintegrating tablet Take 25 mEq by mouth 4 (four) times a week. In the afternoon.   Yes [provider]  Zinc Sulfate (ZINC 15 PO) Take by mouth.   Yes [provider]  colchicine 0.6 MG tablet Take 1 tablet (0.6 mg total) by mouth 2 (two) times daily. Until flare resolves. 03/30/23   Tommie Sams, DO  pregabalin (LYRICA) 50 MG capsule Take 1 capsule (50 mg total) by mouth daily. Patient not taking: Reported on 03/18/2023 03/01/23   [name], MD    Family History Family History  Problem Relation Age of Onset   Healthy Mother     Social History Social History   Tobacco Use   Smoking status: Never    Passive exposure: Never   Smokeless tobacco: Never  Vaping Use   Vaping Use: Never used  Substance Use Topics   Alcohol use: Not Currently   Drug use: Never     Allergies   Patient has no known allergies.   Review of Systems Review of Systems Per HPI  Physical Exam Triage Vital Signs ED Triage Vitals [03/30/23 1453]  Enc Vitals Group     BP 137/74     Pulse Rate 73     Resp 18     Temp 98 F (36.7 C)     Temp Source Oral     SpO2 97 %      Weight      Height      Head Circumference      Peak Flow      Pain Score 5     Pain Loc      Pain Edu?      Excl. in GC?    No data found.  Updated Vital Signs BP 137/74 (BP Location: Left Arm)   Pulse 73   Temp 98 F (36.7 C) (Oral)   Resp 18   SpO2 97%   Visual Acuity Right Eye Distance:   Left Eye Distance:   Bilateral Distance:    Right Eye Near:   Left Eye Near:  Bilateral Near:     Physical Exam Vitals and nursing note reviewed.  Constitutional:      General: He is not in acute distress.    Appearance: Normal appearance.  HENT:     Head: Normocephalic and atraumatic.  Pulmonary:     Effort: Pulmonary effort is normal. No respiratory distress.  Musculoskeletal:     Comments: Left ankle swelling with exquisite tenderness to palpation.  Neurological:     Mental Status: He is alert.  Psychiatric:        Mood and Affect: Mood normal.        Behavior: Behavior normal.      UC Treatments / Results  Labs (all labs ordered are listed, but only abnormal results are displayed) Labs Reviewed - No data to display  EKG   Radiology No results found.  Procedures Procedures (including critical care time)  Medications Ordered in UC Medications  dexamethasone (DECADRON) injection 10 mg (10 mg Intramuscular Given 03/30/23 1533)    Initial Impression / Assessment and Plan / UC Course  I have reviewed the triage vital signs and the nursing notes.  Pertinent labs & imaging results that were available during my care of the patient were reviewed by me and considered in my medical decision making (see chart for details).    67 year old male presents with acute gout.  Advised against corticosteroids.  Patient is adamant for an injection of dexamethasone.  Therefore, injection will be given.  I sent in a prescription for colchicine.  He needs close follow-up with his primary care physician and his specialist.  Needs titration of allopurinol and close monitoring of  his renal function.  He also needs treatment for his type 2 diabetes which is uncontrolled.  Final Clinical Impressions(s) / UC Diagnoses   Final diagnoses:  Acute gout of left ankle, unspecified cause     Discharge Instructions      You need to follow up with your PCP.  Your diabetes is uncontrolled.   Discuss increase in your Allopurinol with your specialists.  You need to see nephrology again as well.    ED Prescriptions     Medication Sig Dispense Auth. Provider   colchicine 0.6 MG tablet Take 1 tablet (0.6 mg total) by mouth 2 (two) times daily. Until flare resolves. 20 tablet Tommie Sams, DO      PDMP not reviewed this encounter.   Tommie Sams, [place] 03/30/23 1549

## 2023-04-01 ENCOUNTER — Encounter: Payer: Self-pay | Admitting: Oncology

## 2023-04-03 ENCOUNTER — Ambulatory Visit: Admission: RE | Admit: 2023-04-03 | Payer: Medicare HMO | Source: Ambulatory Visit

## 2023-04-03 ENCOUNTER — Ambulatory Visit: Payer: Medicare HMO

## 2023-04-03 DIAGNOSIS — C61 Malignant neoplasm of prostate: Secondary | ICD-10-CM | POA: Diagnosis not present

## 2023-04-04 ENCOUNTER — Ambulatory Visit: Payer: Medicare HMO

## 2023-04-04 ENCOUNTER — Ambulatory Visit
Admission: RE | Admit: 2023-04-04 | Discharge: 2023-04-04 | Disposition: A | Discharge status: Home / Self Care | Payer: Medicare HMO | Source: Ambulatory Visit | Attending: Radiation Oncology | Admitting: Radiation Oncology

## 2023-04-04 MED FILL — Dexamethasone Sodium Phosphate Inj 100 MG/10ML: INTRAMUSCULAR | Qty: 1 | Status: AC

## 2023-04-05 ENCOUNTER — Other Ambulatory Visit: Payer: Self-pay

## 2023-04-05 ENCOUNTER — Ambulatory Visit: Payer: Medicare HMO

## 2023-04-05 DIAGNOSIS — Z8673 Personal history of transient ischemic attack (TIA), and cerebral infarction without residual deficits: Secondary | ICD-10-CM | POA: Insufficient documentation

## 2023-04-08 ENCOUNTER — Inpatient Hospital Stay: Payer: Medicare HMO | Attending: Oncology | Admitting: Oncology

## 2023-04-08 ENCOUNTER — Ambulatory Visit: Payer: Medicare HMO

## 2023-04-08 ENCOUNTER — Inpatient Hospital Stay: Payer: Medicare HMO

## 2023-04-08 ENCOUNTER — Other Ambulatory Visit: Payer: Medicare HMO

## 2023-04-08 ENCOUNTER — Other Ambulatory Visit: Payer: Self-pay | Admitting: Oncology

## 2023-04-08 DIAGNOSIS — C9000 Multiple myeloma not having achieved remission: Secondary | ICD-10-CM | POA: Insufficient documentation

## 2023-04-08 DIAGNOSIS — D72819 Decreased white blood cell count, unspecified: Secondary | ICD-10-CM | POA: Insufficient documentation

## 2023-04-08 DIAGNOSIS — Z79899 Other long term (current) drug therapy: Secondary | ICD-10-CM | POA: Insufficient documentation

## 2023-04-08 DIAGNOSIS — D649 Anemia, unspecified: Secondary | ICD-10-CM | POA: Insufficient documentation

## 2023-04-08 DIAGNOSIS — N289 Disorder of kidney and ureter, unspecified: Secondary | ICD-10-CM | POA: Insufficient documentation

## 2023-04-08 DIAGNOSIS — C61 Malignant neoplasm of prostate: Secondary | ICD-10-CM | POA: Insufficient documentation

## 2023-04-08 DIAGNOSIS — D696 Thrombocytopenia, unspecified: Secondary | ICD-10-CM | POA: Insufficient documentation

## 2023-04-08 DIAGNOSIS — G629 Polyneuropathy, unspecified: Secondary | ICD-10-CM | POA: Insufficient documentation

## 2023-04-08 DIAGNOSIS — C189 Malignant neoplasm of colon, unspecified: Secondary | ICD-10-CM | POA: Insufficient documentation

## 2023-04-09 ENCOUNTER — Ambulatory Visit: Payer: Medicare HMO

## 2023-04-10 ENCOUNTER — Ambulatory Visit: Payer: Medicare HMO

## 2023-04-10 ENCOUNTER — Inpatient Hospital Stay: Payer: Medicare HMO

## 2023-04-11 ENCOUNTER — Ambulatory Visit: Payer: Medicare HMO

## 2023-04-12 ENCOUNTER — Ambulatory Visit: Payer: Medicare HMO

## 2023-04-15 ENCOUNTER — Telehealth: Payer: Self-pay | Admitting: *Deleted

## 2023-04-15 ENCOUNTER — Ambulatory Visit: Payer: Medicare HMO

## 2023-04-15 NOTE — Telephone Encounter (Signed)
Patient brother called reporting that patient has had a stroke and that his treatments need to be put on hold fr the time being and that he would like a return call to discuss when he will be able to proceed with treatment.   I spoke with Lorie in radiation therapy and she is going to call him

## 2023-04-16 ENCOUNTER — Ambulatory Visit: Payer: Medicare HMO

## 2023-04-16 ENCOUNTER — Encounter: Payer: Self-pay | Admitting: Oncology

## 2023-04-17 ENCOUNTER — Ambulatory Visit: Payer: Medicare HMO

## 2023-04-17 ENCOUNTER — Other Ambulatory Visit: Payer: Self-pay

## 2023-04-18 ENCOUNTER — Ambulatory Visit: Payer: Medicare HMO

## 2023-04-18 ENCOUNTER — Ambulatory Visit: Payer: Medicare HMO | Attending: Family Medicine | Admitting: Speech Pathology

## 2023-04-18 ENCOUNTER — Telehealth: Payer: Self-pay | Admitting: *Deleted

## 2023-04-18 ENCOUNTER — Other Ambulatory Visit: Payer: Self-pay

## 2023-04-18 DIAGNOSIS — R41841 Cognitive communication deficit: Secondary | ICD-10-CM | POA: Insufficient documentation

## 2023-04-18 DIAGNOSIS — R4701 Aphasia: Secondary | ICD-10-CM | POA: Diagnosis present

## 2023-04-18 NOTE — Therapy (Signed)
OUTPATIENT SPEECH LANGUAGE PATHOLOGY APHASIA EVALUATION   Patient Name: Chris George MRN: [id number] DOB:19-Sep-1956, 67 y.o., male Today's Date: 04/18/2023  PCP: Vallarie Mare REFERRING PROVIDER: Maryjane Hurter, MD   End of Session - 04/18/23 1233     Visit Number 1    Number of Visits 24    Date for SLP Re-Evaluation 07/11/23             Past Medical History:  Diagnosis Date   A-fib (HCC)    Anemia    Arthritis    ankles   Cancer (HCC)    multiple myeloma   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HCC) 10/26/2019   Cardiomyopathy (HCC)    Chemotherapy-induced peripheral neuropathy (HCC)    CHF (congestive heart failure) (HCC)    Depression    Dysrhythmia    atrial fib   HLD (hyperlipidemia)    Hypercholesteremia    Hypertension    Moderate tricuspid insufficiency    Multiple myeloma (HCC)    not being treated right now per pt 11/11/19   Pneumonia    Sleep apnea    No CPAP.  OSA resolved with wt loss.   Past Surgical History:  Procedure Laterality Date   ATRIAL ABLATION SURGERY     CARDIAC SURGERY     A-Fib Ablations   COLONOSCOPY WITH PROPOFOL N/A 10/26/2019   Procedure: COLONOSCOPY WITH BIOPSY;  Surgeon: [name], MD;  Location: [facility];  Service: Endoscopy;  Laterality: N/A;   COLONOSCOPY WITH PROPOFOL N/A 09/03/2022   Procedure: COLONOSCOPY WITH PROPOFOL;  Surgeon: [name], MD;  Location: [facility];  Service: Endoscopy;  Laterality: N/A;   ESOPHAGOGASTRODUODENOSCOPY (EGD) WITH PROPOFOL N/A 10/26/2019   Procedure: ESOPHAGOGASTRODUODENOSCOPY (EGD) WITH BIOPSY;  Surgeon: [name], MD;  Location: [facility];  Service: Endoscopy;  Laterality: N/A;  sleep apnea   LAPAROSCOPIC PARTIAL COLECTOMY Right 11/17/2019   Procedure: LAPAROSCOPIC PARTIAL COLECTOMY RIGHT EXTENDED;  Surgeon: [name], MD;  Location: [facility];  Service: General;  Laterality: Right;   PORTACATH PLACEMENT N/A 12/28/2019   Procedure: INSERTION PORT-A-CATH Left  subclavian;  Surgeon: [name], MD;  Location: [facility];  Service: General;  Laterality: N/A;   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   Diabetes mellitus type 2, controlled, without complications (HCC) 08/07/2021   Neuropathy 08/30/2020   Port-A-Cath in place    Abnormal ECG 11/04/2019   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HCC) 10/27/2019   Goals of care, counseling/discussion 05/18/2019   Multiple myeloma (HCC) 05/17/2019   Anemia 05/12/2019   Arthritis of left ankle 02/05/2018   Cardiomyopathy, idiopathic (HCC) 01/07/2018   Moderate tricuspid insufficiency 01/01/2017   OSA (obstructive sleep apnea) 01/01/2017   Chronic a-fib (HCC) 12/06/2015   Pure hypercholesterolemia 02/09/2015   Anticoagulant long-term use 04/29/2014   Hyperlipidemia 04/29/2014   Hypertension, essential, benign 04/26/2014    ONSET DATE: 04/05/23  REFERRING DIAG: aphasia  THERAPY DIAG:  Aphasia  Cognitive communication deficit  Rationale for Evaluation and Treatment Rehabilitation  SUBJECTIVE:   SUBJECTIVE STATEMENT: Pt pleasant and cooperative. Very motivated to work with SLP.  Pt accompanied by: family member  PERTINENT HISTORY: Patient is a 67 y.o. 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 Pt with PMHx of ca of the transverse colon, multiple myeloma, lymphoma, DM, HTN, HLD, CKD, and recently diagnosed prostate cancer. Pt undergoing radiation tx for cancer  and reports plan to possibly resume chemotherapy.   DIAGNOSTIC FINDINGS:  MRI brain 04/06/23, "MRI brain findings-Acute infarct in the left parietal lobe with small focus of infarct in the right temporal lobe (5:41, 5:38) evident on diffusion-weighted imaging and FLAIR sequences. A few nodular foci of low signal on susceptibility weighted imaging in the region of left parietal infarct (for example  17:46) may represent small areas of microhemorrhage or cortical vessel thrombus.Mild cerebral volume loss with ex vacuo dilatation of the CSF-containing spaces. There are scattered in confluent foci of signal abnormality within the periventricular and deep white matter.  These are nonspecific but commonly seen with small vessel ischemic changes. There is no midline shift. No extra-axial fluid collection. No mass. There is no abnormal enhancement."  PAIN:  Are you having pain? No  FALLS: Has patient fallen in last 6 months?  No  LIVING ENVIRONMENT: Lives with: lives with their family since stroke; pt lived alone prior to stroke Lives in: House/apartment  PLOF:  Level of assistance: Independent with ADLs, Comment: prior to stroke; now reports needing assistance with iADLs  Employment: Retired   PATIENT GOALS    to be able to participate in complex conversation with family and friends; to understanding what he is reading; to improve QoL; to be more independent   OBJECTIVE:  COGNITION: Overall cognitive status: Difficulty to assess due to: Communication impairment Areas of impairment:  Memory: Impaired: Working Functional deficits: difficulty with working memory suspected during conversation, complex command following, and reading comprehension tasks; pt stating, "It was there, but it's gone" during anomic events and auditory and reading comprehension tasks  AUDITORY COMPREHENSION: Overall auditory comprehension: Impaired: moderately complex and complex YES/NO questions: WFL Following directions: Impaired: moderately complex and complex Conversation: Complex and Moderately Complex Interfering components: working Research scientist (life sciences): repetition/stressing words and visual/gestural cues   READING COMPREHENSION: Impaired: phrase, sentence, and paragraph  EXPRESSION: verbal  VERBAL EXPRESSION: Overall verbal expression: Impaired: simple, moderately complex, and complex Level  of generative/spontaneous verbalization: word, phrase, and sentence Automatic speech: social response: intact  Repetition: Impaired: Word, phrase, and sentence Naming: Confrontation: 76-100% and Divergent: 0-25% Pragmatics: Impaired: topic maintenance  Effective technique: semantic cues, sentence completion, phonemic cues, and articulatory cues  WRITTEN EXPRESSION: Dominant hand: right  Written expression: Not tested  MOTOR SPEECH: Overall motor speech: Appears intact  ORAL MOTOR EXAMINATION No functional deficits appreciated; wears dentures which were donned for evaluation  STANDARDIZED ASSESSMENTS:   Western Aphasia Battery- Revised  Spontaneous Speech                           Information content               8/10                                            Fluency                                 4/10                                          Comprehension     Yes/No questions  60/60                                           Auditory Word Recognition  60/60                                Sequential Commands       50/80                              Repetition                             71/100                                        Naming    Object Naming                     51/60                                           Word Fluency                        3/20                                            Sentence Completion          10/10                                             Responsive Speech              10/10                                         Aphasia Quotient                 70/100         Pt's severity rating was moderate as indicated by an Aphasia Quotient of 70 (0-25=very severe, 26-50=severe, 51-75=moderate, 76 and above is mild). 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 Reading is also impaired for moderately complex  information. Writing TBA.  Pt's verbal expression is characterized by halting, telegraphic speech; mostly single words, paraphasias; occasional prepositional phrases; severe anomia. Pt with s/sx concomitant apraxia of speech as evidenced by halting speech patterns, multiple revisions, and articulatory groping which is most evident during repetition task. Pt reports vastly different functional receptive and expressive communication compared to baseline. Pt enjoys in depth conversation and banter with family and friends and is also an avid reader. He reports significant difficulty with both.   PATIENT REPORTED OUTCOME MEASURES (  PROM):  The Communication Effectiveness Survey is a patient-reported outcome measure in which the patient rates their own effectiveness in different communication situations. A higher score indicates greater effectiveness.   Pt's self-rating was 15/32.   Having a conversation with a family member or friends at home. 2 Participating in conversation with strangers in a quiet place. 2 Conversing with a familiar person over the telephone. 2 Conversing with a stranger over the telephone. 2 Being part of a conversation in a noisy environment (social gathering). 1- Not at all effective Speaking to a friend when you are emotionally upset or you are angry. 2 Having a conversation while traveling in a car. 2 Having a conversation with someone at a distance (across a room). 2   TODAY'S TREATMENT:  Skilled SLP treatment provided including introduction of basic communication strategies (semantic features analysis/circumlocution; environmental modification; self-advocacy) and community resources as well as education re: changes to communication following CVA, aphasia, principles of neuroplasticity, and role of SLP. Pt stimulable for use of SFA/circumlocution to repair communication breakdowns. Further diagnostic tx to be provided in upcoming sessions.   PATIENT EDUCATION: Education  details: changes to communication following CVA, aphasia, principles of neuroplasticity, introduction to community resources, role of SLP Person educated: Patient and Engineer, structural - brother, Riley Lam Education method: Explanation Education comprehension: verbalized understanding; needs further reinforcement  HOME EXERCISE PROGRAM:   To be given to patient    GOALS:  Goals reviewed with patient? Yes  SHORT TERM GOALS: Target date: 10 sessions  Patient will demonstrate improved expressive language skills during communication breakdowns by using multimodal means (semantic feature analysis, circumlocution, synonym/antonym use, gestures, writing, drawing) to repair with mod cues. Baseline: Goal status: INITIAL  2.  With Moderate A, patient will complete a semantic feature analysis with at least 3-5 relevant features for 8/10 target words with cues to improve word-finding skills.  Baseline:  Goal status: INITIAL  3.  With Moderate A, patient will generate sentences with 3 or more words during structured task at 70% accuracy in order to increase ability to communicate basic wants and needs.  Baseline:  Goal status: INITIAL  4.  Pt will participate in further assessment of functional reading and writing to help determine level of assistance needed for language based iADL tasks. Baseline:  Goal status: INITIAL  5.  Patient will follow two step commands 70% acc in context of a functional activity.  Baseline:  Goal status: INITIAL  LONG TERM GOALS: Target date: 12 weeks  Patient will demonstrate knowledge of appropriate activities to support functional receptive and expressive language outside of ST with assistance from family.  Baseline:  Goal status: INITIAL  2.  Patient will report improved confidence in conversations outside of home (appointments, friends, on the phone) than prior to ST, as measured by Communication Effectiveness Survey.  Baseline:  15/32 Goal status: INITIAL  3.   Patient and/or family will report use of strategies outside of ST to improve communication (use of scripts, pre-planning, semantic feature analysis, supported conversation).  Baseline:  Goal status: INITIAL  4.  Patient will demonstrate improved ability with describing events, storytelling, or sharing medical information through personalized strategies (requesting time, using SFA, drawing, etc) as measured by Communication Effectiveness Survey.  Baseline:  Goal status: INITIAL   ASSESSMENT:  CLINICAL IMPRESSION: Patient is a 67 y.o. 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 Pt with PMHx of ca of the transverse colon, multiple myeloma, lymphoma, DM, HTN, HLD, CKD,  and recently diagnosed prostate cancer. Pt was seen today for speech/language evaluation. Evaluation completed via Western Aphasia Battery Revised, diagnostic interviewing, and PROM. Pt's severity rating was moderate as indicated by an Aphasia Quotient of 70 on Western Aphasia Battery. 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 Reading is also impaired for moderately complex information. Writing TBA.  Pt's verbal expression is characterized by halting, telegraphic speech; mostly single words, paraphasias; occasional prepositional phrases; severe anomia. Pt with s/sx concomitant apraxia of speech as evidenced by halting speech patterns, multiple revisions, and articulatory groping which is most evident during repetition task. Pt reports vastly different functional receptive and expressive communication compared to baseline and reports "frustration" regarding CLOF. Pt enjoys in depth conversation and banter with family and friends and is also an avid reader. He reports significant difficulty with both. Although cognition was not fully assessed suspect pt with working memory impairment as  well. I recommend course of skilled SLP services targeting functional receptive and expressive communication to improve pt's participation in iADLs, preferred activities, and QoL.   OBJECTIVE IMPAIRMENTS include expressive language, receptive language, and aphasia. These impairments are limiting patient from managing medications, managing appointments, managing finances, household responsibilities, ADLs/IADLs, and effectively communicating at home and in community. Factors affecting potential to achieve goals and functional outcome are co-morbidities. Patient will benefit from skilled SLP services to address above impairments and improve overall function.  REHAB POTENTIAL: Excellent  PLAN: SLP FREQUENCY: 2x/week  SLP DURATION: 12 weeks  PLANNED INTERVENTIONS: Cueing hierachy, Internal/external aids, Functional tasks, Multimodal communication approach, SLP instruction and feedback, Compensatory strategies, and Patient/family education    Clyde Canterbury, M.S., CCC-SLP Speech-Language Pathologist  [facility] [address] [place], [place], [address] Phone: [phone]   Fax:  [phone]

## 2023-04-18 NOTE — Telephone Encounter (Signed)
Patient brother called reporting that patient will be restarting his radiation therapy Monday and is asking if he will restart his chemotherapy treatments then too. I do not see that he is scheduled for med onc. Please advise

## 2023-04-19 ENCOUNTER — Ambulatory Visit: Payer: Medicare HMO

## 2023-04-19 ENCOUNTER — Encounter: Payer: Self-pay | Admitting: Oncology

## 2023-04-19 ENCOUNTER — Other Ambulatory Visit: Payer: Self-pay

## 2023-04-19 ENCOUNTER — Telehealth: Payer: Self-pay | Admitting: *Deleted

## 2023-04-19 NOTE — Telephone Encounter (Signed)
error 

## 2023-04-22 ENCOUNTER — Ambulatory Visit
Admission: RE | Admit: 2023-04-22 | Discharge: 2023-04-22 | Disposition: A | Discharge status: Home / Self Care | Payer: Medicare HMO | Source: Ambulatory Visit | Attending: Radiation Oncology | Admitting: Radiation Oncology

## 2023-04-22 ENCOUNTER — Ambulatory Visit: Payer: Medicare HMO | Admitting: Oncology

## 2023-04-22 ENCOUNTER — Ambulatory Visit
Admission: EM | Admit: 2023-04-22 | Discharge: 2023-04-22 | Disposition: A | Discharge status: Home / Self Care | Payer: Medicare HMO | Attending: Physician Assistant | Admitting: Physician Assistant

## 2023-04-22 ENCOUNTER — Other Ambulatory Visit: Payer: Self-pay

## 2023-04-22 ENCOUNTER — Other Ambulatory Visit: Payer: Medicare HMO

## 2023-04-22 ENCOUNTER — Ambulatory Visit: Payer: Medicare HMO

## 2023-04-22 ENCOUNTER — Encounter: Payer: Self-pay | Admitting: Oncology

## 2023-04-22 ENCOUNTER — Ambulatory Visit: Payer: Medicare HMO | Admitting: Speech Pathology

## 2023-04-22 DIAGNOSIS — C61 Malignant neoplasm of prostate: Secondary | ICD-10-CM | POA: Insufficient documentation

## 2023-04-22 DIAGNOSIS — M109 Gout, unspecified: Secondary | ICD-10-CM | POA: Diagnosis not present

## 2023-04-22 DIAGNOSIS — R4701 Aphasia: Secondary | ICD-10-CM

## 2023-04-22 DIAGNOSIS — R41841 Cognitive communication deficit: Secondary | ICD-10-CM

## 2023-04-22 DIAGNOSIS — G620 Drug-induced polyneuropathy: Secondary | ICD-10-CM | POA: Insufficient documentation

## 2023-04-22 DIAGNOSIS — F320 Major depressive disorder, single episode, mild: Secondary | ICD-10-CM | POA: Insufficient documentation

## 2023-04-22 DIAGNOSIS — N2581 Secondary hyperparathyroidism of renal origin: Secondary | ICD-10-CM | POA: Insufficient documentation

## 2023-04-22 DIAGNOSIS — K7460 Unspecified cirrhosis of liver: Secondary | ICD-10-CM | POA: Insufficient documentation

## 2023-04-22 LAB — RAD ONC ARIA SESSION SUMMARY
Course Elapsed Days: 0
Plan Fractions Treated to Date: 1
Plan Prescribed Dose Per Fraction: 2.5 Gy
Plan Total Fractions Prescribed: 28
Plan Total Prescribed Dose: 70 Gy
Reference Point Dosage Given to Date: 2.5 Gy
Reference Point Session Dosage Given: 2.5 Gy
Session Number: 1

## 2023-04-22 MED ORDER — COLCHICINE 0.6 MG PO TABS: 0.6000 mg | ORAL_TABLET | Freq: Two times a day (BID) | ORAL | 1 refills | Status: DC

## 2023-04-22 MED ORDER — DEXAMETHASONE SODIUM PHOSPHATE 10 MG/ML IJ SOLN
10.0000 mg | Freq: Once | INTRAMUSCULAR | Status: AC
  Administered 2023-04-22: 10 mg via INTRAMUSCULAR

## 2023-04-22 NOTE — Discharge Instructions (Addendum)
-  Start colchicine and continue allopurinol.  -You were given dexamethasone injection for gout flare up.  -Avoid triggers and follow up with PCP

## 2023-04-22 NOTE — Therapy (Addendum)
OUTPATIENT SPEECH LANGUAGE PATHOLOGY  APHASIA TREATMENT   Patient Name: Chris George MRN: [id number] DOB:1956/10/07, 67 y.o., male Today's Date: 04/22/2023  PCP: Vallarie Mare REFERRING PROVIDER: Maryjane Hurter, MD   End of Session - 04/18/23 1233     Visit Number 2    Number of Visits 24    Date for SLP Re-Evaluation 07/11/23             Past Medical History:  Diagnosis Date   A-fib (HCC)    Anemia    Arthritis    ankles   Cancer (HCC)    multiple myeloma   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HCC) 10/26/2019   Cardiomyopathy (HCC)    Chemotherapy-induced peripheral neuropathy (HCC)    CHF (congestive heart failure) (HCC)    Depression    Dysrhythmia    atrial fib   HLD (hyperlipidemia)    Hypercholesteremia    Hypertension    Moderate tricuspid insufficiency    Multiple myeloma (HCC)    not being treated right now per pt 11/11/19   Pneumonia    Sleep apnea    No CPAP.  OSA resolved with wt loss.   Past Surgical History:  Procedure Laterality Date   ATRIAL ABLATION SURGERY     CARDIAC SURGERY     A-Fib Ablations   COLONOSCOPY WITH PROPOFOL N/A 10/26/2019   Procedure: COLONOSCOPY WITH BIOPSY;  Surgeon: [name], MD;  Location: [facility];  Service: Endoscopy;  Laterality: N/A;   COLONOSCOPY WITH PROPOFOL N/A 09/03/2022   Procedure: COLONOSCOPY WITH PROPOFOL;  Surgeon: [name], MD;  Location: [facility];  Service: Endoscopy;  Laterality: N/A;   ESOPHAGOGASTRODUODENOSCOPY (EGD) WITH PROPOFOL N/A 10/26/2019   Procedure: ESOPHAGOGASTRODUODENOSCOPY (EGD) WITH BIOPSY;  Surgeon: [name], MD;  Location: [facility];  Service: Endoscopy;  Laterality: N/A;  sleep apnea   LAPAROSCOPIC PARTIAL COLECTOMY Right 11/17/2019   Procedure: LAPAROSCOPIC PARTIAL COLECTOMY RIGHT EXTENDED;  Surgeon: [name], MD;  Location: [facility];  Service: General;  Laterality: Right;   PORTACATH PLACEMENT N/A 12/28/2019   Procedure: INSERTION PORT-A-CATH Left  subclavian;  Surgeon: [name], MD;  Location: [facility];  Service: General;  Laterality: N/A;   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   Diabetes mellitus type 2, controlled, without complications (HCC) 08/07/2021   Neuropathy 08/30/2020   Port-A-Cath in place    Abnormal ECG 11/04/2019   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HCC) 10/27/2019   Goals of care, counseling/discussion 05/18/2019   Multiple myeloma (HCC) 05/17/2019   Anemia 05/12/2019   Arthritis of left ankle 02/05/2018   Cardiomyopathy, idiopathic (HCC) 01/07/2018   Moderate tricuspid insufficiency 01/01/2017   OSA (obstructive sleep apnea) 01/01/2017   Chronic a-fib (HCC) 12/06/2015   Pure hypercholesterolemia 02/09/2015   Anticoagulant long-term use 04/29/2014   Hyperlipidemia 04/29/2014   Hypertension, essential, benign 04/26/2014    ONSET DATE: 04/05/23  REFERRING DIAG: aphasia  THERAPY DIAG:  Aphasia  Cognitive communication deficit  Rationale for Evaluation and Treatment Rehabilitation  SUBJECTIVE:   SUBJECTIVE STATEMENT: Pt pleasant and cooperative. Very motivated to work with SLP. "I have a lot going on." (re: medical issues)  Pt accompanied by: friend (who waited in lobby)  PERTINENT HISTORY: Patient is a 67 y.o. 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 Pt with PMHx of ca of the transverse colon, multiple myeloma, lymphoma, DM, HTN,  HLD, CKD, and recently diagnosed prostate cancer. Pt undergoing radiation tx for cancer and reports plan to possibly resume chemotherapy.   DIAGNOSTIC FINDINGS:  MRI brain 04/06/23, "MRI brain findings-Acute infarct in the left parietal lobe with small focus of infarct in the right temporal lobe (5:41, 5:38) evident on diffusion-weighted imaging and FLAIR sequences. A few nodular foci of low signal on susceptibility weighted  imaging in the region of left parietal infarct (for example 17:46) may represent small areas of microhemorrhage or cortical vessel thrombus.Mild cerebral volume loss with ex vacuo dilatation of the CSF-containing spaces. There are scattered in confluent foci of signal abnormality within the periventricular and deep white matter.  These are nonspecific but commonly seen with small vessel ischemic changes. There is no midline shift. No extra-axial fluid collection. No mass. There is no abnormal enhancement."  PAIN:  Are you having pain? No  FALLS: Has patient fallen in last 6 months?  No  LIVING ENVIRONMENT: Lives with: lives with their family since stroke; pt lived alone prior to stroke Lives in: House/apartment  PLOF:  Level of assistance: Independent with ADLs, Comment: prior to stroke; now reports needing assistance with iADLs  Employment: Retired   PATIENT GOALS    to be able to participate in complex conversation with family and friends; to understanding what he is reading; to improve QoL; to be more independent   OBJECTIVE:  TODAY'S TREATMENT: Pt seen for skilled SLP services targeting improve functional and receptive communication. Pt educated re: [employer] ([employer]) website as a Theatre stage manager as well as therapy groups via [employer] for a [employer]. With mod/max assistance, pt filled out biographical intake form on [employer] website to receive additional information about program. Pt watched brief TEDed video re: aphasia with SLP. Pt asked pertinent questions re: aphasia, pt's CLOF, and prognostic indicators for improved communication. Using [employer], reintroducted semantic features analysis/circumlocution to repair anomic event. Pt required mod/max assistance to utilize during structured tasks and max assistance to utilize during informal conversations. SFA appeared to improve pt's word retrieval. Further diagnostic tx to be provided in upcoming sessions. Pt  agreeable to allowing SLP to set up an account with [employer] for him for HEP. Ongoing supportive counseling provided re: communication difficulty due to aphasia.   PATIENT EDUCATION: Education details: changes to communication following CVA, aphasia, principles of neuroplasticity, introduction to community resources Person educated: Patient Education method: Explanation Education comprehension: verbalized understanding; needs further reinforcement  HOME EXERCISE PROGRAM:   To be given to patient; pt working on internet access for web based applications    GOALS:  Goals reviewed with patient? Yes  SHORT TERM GOALS: Target date: 10 sessions  Patient will demonstrate improved expressive language skills during communication breakdowns by using multimodal means (semantic feature analysis, circumlocution, synonym/antonym use, gestures, writing, drawing) to repair with mod cues. Baseline: Goal status: INITIAL  2.  With Moderate A, patient will complete a semantic feature analysis with at least 3-5 relevant features for 8/10 target words with cues to improve word-finding skills.  Baseline:  Goal status: INITIAL  3.  With Moderate A, patient will generate sentences with 3 or more words during structured task at 70% accuracy in order to increase ability to communicate basic wants and needs.  Baseline:  Goal status: INITIAL  4.  Pt will participate in further assessment of functional reading and writing to help determine level of assistance needed for language based iADL tasks. Baseline:  Goal status: INITIAL  5.  Patient  will follow two step commands 70% acc in context of a functional activity.  Baseline:  Goal status: INITIAL  LONG TERM GOALS: Target date: 12 weeks  Patient will demonstrate knowledge of appropriate activities to support functional receptive and expressive language outside of ST with assistance from family.  Baseline:  Goal status: INITIAL  2.  Patient will report  improved confidence in conversations outside of home (appointments, friends, on the phone) than prior to ST, as measured by Communication Effectiveness Survey.  Baseline:  15/32 Goal status: INITIAL  3.  Patient and/or family will report use of strategies outside of ST to improve communication (use of scripts, pre-planning, semantic feature analysis, supported conversation).  Baseline:  Goal status: INITIAL  4.  Patient will demonstrate improved ability with describing events, storytelling, or sharing medical information through personalized strategies (requesting time, using SFA, drawing, etc) as measured by Communication Effectiveness Survey.  Baseline:  Goal status: INITIAL   ASSESSMENT:  CLINICAL IMPRESSION: Patient is a 67 y.o. 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 Pt with PMHx of ca of the transverse colon, multiple myeloma, lymphoma, DM, HTN, HLD, CKD, and recently diagnosed prostate cancer. Pt was seen today for speech/language evaluation. Evaluation completed via Western Aphasia Battery Revised, diagnostic interviewing, and PROM. Pt's severity rating was moderate as indicated by an Aphasia Quotient of 70 on Western Aphasia Battery. 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 Reading is also impaired for moderately complex information. Writing TBA.  Pt's verbal expression is characterized by halting, telegraphic speech; mostly single words, paraphasias; occasional prepositional phrases; severe anomia. Pt with s/sx concomitant apraxia of speech as evidenced by halting speech patterns, multiple revisions, and articulatory groping which is most evident during repetition task. Pt reports vastly different functional receptive and expressive communication compared to baseline and reports "frustration" regarding CLOF. Pt enjoys in depth  conversation and banter with family and friends and is also an avid reader. He reports significant difficulty with both. Although cognition was not fully assessed suspect pt with working memory impairment as well. I recommend course of skilled SLP services targeting functional receptive and expressive communication to improve pt's participation in iADLs, preferred activities, and QoL.   OBJECTIVE IMPAIRMENTS include expressive language, receptive language, and aphasia. These impairments are limiting patient from managing medications, managing appointments, managing finances, household responsibilities, ADLs/IADLs, and effectively communicating at home and in community. Factors affecting potential to achieve goals and functional outcome are co-morbidities. Patient will benefit from skilled SLP services to address above impairments and improve overall function.  REHAB POTENTIAL: Excellent  PLAN: SLP FREQUENCY: 2x/week  SLP DURATION: 12 weeks  PLANNED INTERVENTIONS: Cueing hierachy, Internal/external aids, Functional tasks, Multimodal communication approach, SLP instruction and feedback, Compensatory strategies, and Patient/family education    Clyde Canterbury, M.S., CCC-SLP Speech-Language Pathologist  [facility] at [facility] [address] [place], [place], [address] Phone: [phone]   Fax:  [phone]

## 2023-04-22 NOTE — ED Triage Notes (Addendum)
Patient presents to [facility] for gout flare-up to right elbow and left foot x 7 days. States the prescriptions prescribed for gout do not provide relief. States he has not had any tylenol or ibuprofen since he tries not to take a lot of medications due to his kidney fx and cancer. Reports coming in to UC every 6 weeks for the "shot" that helps with the pain.

## 2023-04-22 NOTE — ED Provider Notes (Signed)
[facility]    CSN: [id number] Arrival date & time: 04/22/23  1128      History   Chief Complaint Chief Complaint  Patient presents with   Gout    HPI Chris George is a 67 y.o. male with medical history significant for gout with recurrent flareups.  Patient was last seen for acute gout flareup on 03/30/2023.  He reported a flareup of his left ankle at that time.  He was given a dexamethasone injection and colchicine.  He says this normally helps his gout flareups and he states he has had 2 received dexamethasone injections every 6 weeks or so for flareups of gout in his left ankle or right elbow.  He has a history of chronic kidney disease and his last GFR was 40 noted in previous office visit from 03/30/2023.  He also recently had a CVA on 04/05/2023.  Since then he has been started on Eliquis and atorvastatin.  He is following up with specialist.  Additionally he has a history of cancer/multiple myeloma and just recently restarted chemotherapy and radiation.  Patient says his gout is very difficult to control.  He also has a history of diabetes and is not taking any medication.  He says he was not recently started on any medication.  He does take allopurinol 100 mg daily for gout prophylaxis but does not think it helped at all.  He reports he needs to make an appointment with his PCP to recheck his uric acid level and see if the allopurinol can be adjusted.   HPI  Past Medical History:  Diagnosis Date   A-fib (HCC)    Anemia    Arthritis    ankles   Cancer (HCC)    multiple myeloma   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HCC) 10/26/2019   Cardiomyopathy (HCC)    Chemotherapy-induced peripheral neuropathy (HCC)    CHF (congestive heart failure) (HCC)    Depression    Dysrhythmia    atrial fib   HLD (hyperlipidemia)    Hypercholesteremia    Hypertension    Moderate tricuspid insufficiency    Multiple myeloma (HCC)    not being treated right now per pt 11/11/19   Pneumonia     Sleep apnea    No CPAP.  OSA resolved with wt loss.    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   Diabetes mellitus type 2, controlled, without complications (HCC) 08/07/2021   Neuropathy 08/30/2020   Port-A-Cath in place    Abnormal ECG 11/04/2019   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HCC) 10/27/2019   Goals of care, counseling/discussion 05/18/2019   Multiple myeloma (HCC) 05/17/2019   Anemia 05/12/2019   Arthritis of left ankle 02/05/2018   Cardiomyopathy, idiopathic (HCC) 01/07/2018   Moderate tricuspid insufficiency 01/01/2017   OSA (obstructive sleep apnea) 01/01/2017   Chronic a-fib (HCC) 12/06/2015   Pure hypercholesterolemia 02/09/2015   Anticoagulant long-term use 04/29/2014   Hyperlipidemia 04/29/2014   Hypertension, essential, benign 04/26/2014    Past Surgical History:  Procedure Laterality Date   ATRIAL ABLATION SURGERY     CARDIAC SURGERY     A-Fib Ablations   COLONOSCOPY WITH PROPOFOL N/A 10/26/2019   Procedure: COLONOSCOPY WITH BIOPSY;  Surgeon: [name], MD;  Location: [facility];  Service: Endoscopy;  Laterality: N/A;   COLONOSCOPY WITH PROPOFOL N/A 09/03/2022  Procedure: COLONOSCOPY WITH PROPOFOL;  Surgeon: [name], MD;  Location: [facility];  Service: Endoscopy;  Laterality: N/A;   ESOPHAGOGASTRODUODENOSCOPY (EGD) WITH PROPOFOL N/A 10/26/2019   Procedure: ESOPHAGOGASTRODUODENOSCOPY (EGD) WITH BIOPSY;  Surgeon: [name], MD;  Location: [facility];  Service: Endoscopy;  Laterality: N/A;  sleep apnea   LAPAROSCOPIC PARTIAL COLECTOMY Right 11/17/2019   Procedure: LAPAROSCOPIC PARTIAL COLECTOMY RIGHT EXTENDED;  Surgeon: [name], MD;  Location: [facility];  Service: General;  Laterality: Right;   PORTACATH PLACEMENT N/A 12/28/2019   Procedure: INSERTION  PORT-A-CATH Left subclavian;  Surgeon: [name], MD;  Location: [facility];  Service: General;  Laterality: N/A;       Home Medications    Prior to Admission medications   Medication Sig Start Date End Date Taking? Authorizing Provider  apixaban (ELIQUIS) 5 MG TABS tablet Take 5 mg by mouth 2 (two) times daily.   Yes [provider]  atorvastatin (LIPITOR) 80 MG tablet Take 80 mg by mouth daily.   Yes [provider]  allopurinol (ZYLOPRIM) 100 MG tablet Take 100 mg by mouth daily. 11/01/22 11/01/23  [provider]  APPLE CIDER VINEGAR PO Take 30-45 mLs by mouth every other day.     [provider]  aspirin 325 MG EC tablet Take 325 mg by mouth every other day. In the morning    [provider]  colchicine 0.6 MG tablet Take 1 tablet (0.6 mg total) by mouth 2 (two) times daily. Until flare resolves. 04/22/23   Shirlee Latch, PA-C  digoxin (LANOXIN) 0.25 MG tablet Take 0.25 mg by mouth daily.     [provider]  furosemide (LASIX) 80 MG tablet Take 80 mg by mouth daily. Morning & late afternoon    [provider]  lidocaine-prilocaine (EMLA) cream Apply to affected area once 03/18/23   [name], MD  MAGNESIUM PO Take by mouth.    [provider]  Misc Natural Products (TART CHERRY ADVANCED PO) Take 60 mLs by mouth every other day.    [provider]  Multiple Vitamins-Minerals (ZINC PO) Take by mouth.    [provider]  potassium bicarbonate (K-LYTE) 25 MEQ disintegrating tablet Take 25 mEq by mouth 4 (four) times a week. In the afternoon.    [provider]  pregabalin (LYRICA) 50 MG capsule Take 1 capsule (50 mg total) by mouth daily. Patient not taking: Reported on 03/18/2023 03/01/23   [name], MD  Zinc Sulfate (ZINC 15 PO) Take by mouth.    [provider]    Family History Family History  Problem Relation Age of Onset   Healthy Mother     Social  History Social History   Tobacco Use   Smoking status: Never    Passive exposure: Never   Smokeless tobacco: Never  Vaping Use   Vaping Use: Never used  Substance Use Topics   Alcohol use: Not Currently   Drug use: Never     Allergies   Patient has no known allergies.   Review of Systems Review of Systems  Musculoskeletal:  Positive for arthralgias and joint swelling.  Skin:  Negative for color change and wound.  Neurological:  Negative for weakness and numbness.     Physical Exam Triage Vital Signs ED Triage Vitals  Enc Vitals Group     BP 04/22/23 1202 127/84     Pulse Rate 04/22/23 1202 87     Resp 04/22/23 1202 16  Temp 04/22/23 1202 97.8 F (36.6 C)     Temp Source 04/22/23 1202 Oral     SpO2 04/22/23 1202 97 %     Weight --      Height --      Head Circumference --      Peak Flow --      Pain Score 04/22/23 1200 7     Pain Loc --      Pain Edu? --      Excl. in GC? --    No data found.  Updated Vital Signs BP 127/84 (BP Location: Left Arm)   Pulse 87   Temp 97.8 F (36.6 C) (Oral)   Resp 16   SpO2 97%       Physical Exam Vitals and nursing note reviewed.  Constitutional:      General: He is not in acute distress.    Appearance: Normal appearance. He is well-developed. He is not ill-appearing.  HENT:     Head: Normocephalic and atraumatic.  Eyes:     General: No scleral icterus.    Conjunctiva/sclera: Conjunctivae normal.  Cardiovascular:     Rate and Rhythm: Normal rate and regular rhythm.     Pulses: Normal pulses.  Pulmonary:     Effort: Pulmonary effort is normal. No respiratory distress.     Breath sounds: Normal breath sounds.  Musculoskeletal:     Cervical back: Neck supple.     Comments: Right elbow: There is increased swelling without erythema or warmth.  Difficulty with extension of the elbow and cannot fully extend elbow.  Diffuse tenderness palpation throughout the right posterior elbow.  Good pulses.  Left ankle:   Swelling, tenderness and slight erythema of the medial aspect of the ankle.  Reduced range of motion.  Good pulses.  Skin:    General: Skin is warm and dry.     Capillary Refill: Capillary refill takes less than 2 seconds.  Neurological:     General: No focal deficit present.     Mental Status: He is alert. Mental status is at baseline.     Gait: Gait normal.     Comments: Patient's speech is slowed at times and he sometimes forgets words.  States this is secondary to the recent CVA a couple weeks ago.  Psychiatric:        Mood and Affect: Mood normal.        Speech: Speech is delayed.        Behavior: Behavior normal.      UC Treatments / Results  Labs (all labs ordered are listed, but only abnormal results are displayed) Labs Reviewed - No data to display  EKG   Radiology No results found.  Procedures Procedures (including critical care time)  Medications Ordered in UC Medications  dexamethasone (DECADRON) injection 10 mg (10 mg Intramuscular Given 04/22/23 1235)    Initial Impression / Assessment and Plan / UC Course  I have reviewed the triage vital signs and the nursing notes.  Pertinent labs & imaging results that were available during my care of the patient were reviewed by me and considered in my medical decision making (see chart for details).   67 year old male with history of recurrent gout flareups, chronic kidney disease, atrial fibrillation and recent CVA on 04/05/2023 presents for 3-day history of pain, swelling of the right elbow and left ankle.  States that he normally receives 10 mg IM dexamethasone for acute flareups.  He does not prefer to take oral prednisone  as that will increase his blood sugar.  He also reports he is typically started on colchicine.  States this typically works for him.  Additionally he does take allopurinol 100 mg daily for prophylaxis.  He would like to be treated again with dexamethasone and colchicine.  Plans to follow-up with PCP  regarding frequent flareups.  Patient given 10 mg IM dexamethasone in clinic.  Advised to continue allopurinol and start colchicine.  Reviewed RICE guidelines.  Reviewed return to ER precautions.   Final Clinical Impressions(s) / UC Diagnoses   Final diagnoses:  Acute gout of left ankle, unspecified cause  Acute gout of right elbow, unspecified cause     Discharge Instructions      -Start colchicine and continue allopurinol.  -You were given dexamethasone injection for gout flare up.  -Avoid triggers and follow up with PCP       ED Prescriptions     Medication Sig Dispense Auth. Provider   colchicine 0.6 MG tablet Take 1 tablet (0.6 mg total) by mouth 2 (two) times daily. Until flare resolves. 20 tablet Gareth Morgan      PDMP not reviewed this encounter.   Shirlee Latch, PA-C 04/22/23 1241

## 2023-04-23 ENCOUNTER — Other Ambulatory Visit: Payer: Self-pay

## 2023-04-23 ENCOUNTER — Inpatient Hospital Stay: Payer: Medicare HMO | Admitting: Oncology

## 2023-04-23 ENCOUNTER — Encounter: Payer: Self-pay | Admitting: Oncology

## 2023-04-23 ENCOUNTER — Inpatient Hospital Stay (HOSPITAL_BASED_OUTPATIENT_CLINIC_OR_DEPARTMENT_OTHER): Payer: Medicare HMO | Admitting: Oncology

## 2023-04-23 ENCOUNTER — Ambulatory Visit
Admission: RE | Admit: 2023-04-23 | Discharge: 2023-04-23 | Disposition: A | Discharge status: Home / Self Care | Payer: Medicare HMO | Source: Ambulatory Visit | Attending: Radiation Oncology | Admitting: Radiation Oncology

## 2023-04-23 ENCOUNTER — Inpatient Hospital Stay: Payer: Medicare HMO | Admitting: Hospice and Palliative Medicine

## 2023-04-23 ENCOUNTER — Ambulatory Visit: Payer: Medicare HMO | Admitting: Speech Pathology

## 2023-04-23 VITALS — BP 112/61 | HR 46 | Temp 96.8°F | Resp 16 | Ht 69.0 in | Wt 214.0 lb

## 2023-04-23 DIAGNOSIS — R41841 Cognitive communication deficit: Secondary | ICD-10-CM

## 2023-04-23 DIAGNOSIS — Z79899 Other long term (current) drug therapy: Secondary | ICD-10-CM | POA: Diagnosis not present

## 2023-04-23 DIAGNOSIS — C184 Malignant neoplasm of transverse colon: Secondary | ICD-10-CM | POA: Diagnosis not present

## 2023-04-23 DIAGNOSIS — C61 Malignant neoplasm of prostate: Secondary | ICD-10-CM | POA: Diagnosis present

## 2023-04-23 DIAGNOSIS — C189 Malignant neoplasm of colon, unspecified: Secondary | ICD-10-CM | POA: Diagnosis present

## 2023-04-23 DIAGNOSIS — C9000 Multiple myeloma not having achieved remission: Secondary | ICD-10-CM | POA: Diagnosis not present

## 2023-04-23 DIAGNOSIS — D696 Thrombocytopenia, unspecified: Secondary | ICD-10-CM | POA: Diagnosis not present

## 2023-04-23 DIAGNOSIS — R4701 Aphasia: Secondary | ICD-10-CM

## 2023-04-23 DIAGNOSIS — D649 Anemia, unspecified: Secondary | ICD-10-CM | POA: Diagnosis not present

## 2023-04-23 DIAGNOSIS — D72819 Decreased white blood cell count, unspecified: Secondary | ICD-10-CM | POA: Diagnosis not present

## 2023-04-23 DIAGNOSIS — G629 Polyneuropathy, unspecified: Secondary | ICD-10-CM | POA: Diagnosis not present

## 2023-04-23 DIAGNOSIS — N289 Disorder of kidney and ureter, unspecified: Secondary | ICD-10-CM | POA: Diagnosis not present

## 2023-04-23 LAB — RAD ONC ARIA SESSION SUMMARY
Course Elapsed Days: 1
Plan Fractions Treated to Date: 2
Plan Prescribed Dose Per Fraction: 2.5 Gy
Plan Total Fractions Prescribed: 28
Plan Total Prescribed Dose: 70 Gy
Reference Point Dosage Given to Date: 5 Gy
Reference Point Session Dosage Given: 2.5 Gy
Session Number: 2

## 2023-04-23 MED ORDER — PROCHLORPERAZINE MALEATE 10 MG PO TABS
10.0000 mg | ORAL_TABLET | Freq: Four times a day (QID) | ORAL | 1 refills | Status: DC | PRN
Start: 2023-04-23 — End: 2024-03-13

## 2023-04-23 NOTE — Therapy (Signed)
OUTPATIENT SPEECH LANGUAGE PATHOLOGY  APHASIA TREATMENT   Patient Name: Chris George MRN: [id number] DOB:07-03-1956, 67 y.o., male Today's Date: 04/22/2023  PCP: Vallarie Mare REFERRING PROVIDER: Maryjane Hurter, MD   End of Session - 04/18/23 1233     Visit Number 3    Number of Visits 24    Date for SLP Re-Evaluation 07/11/23             Past Medical History:  Diagnosis Date   A-fib (HCC)    Anemia    Arthritis    ankles   Cancer (HCC)    multiple myeloma   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HCC) 10/26/2019   Cardiomyopathy (HCC)    Chemotherapy-induced peripheral neuropathy (HCC)    CHF (congestive heart failure) (HCC)    Depression    Dysrhythmia    atrial fib   HLD (hyperlipidemia)    Hypercholesteremia    Hypertension    Moderate tricuspid insufficiency    Multiple myeloma (HCC)    not being treated right now per pt 11/11/19   Pneumonia    Sleep apnea    No CPAP.  OSA resolved with wt loss.   Past Surgical History:  Procedure Laterality Date   ATRIAL ABLATION SURGERY     CARDIAC SURGERY     A-Fib Ablations   COLONOSCOPY WITH PROPOFOL N/A 10/26/2019   Procedure: COLONOSCOPY WITH BIOPSY;  Surgeon: [name], MD;  Location: [facility];  Service: Endoscopy;  Laterality: N/A;   COLONOSCOPY WITH PROPOFOL N/A 09/03/2022   Procedure: COLONOSCOPY WITH PROPOFOL;  Surgeon: [name], MD;  Location: [facility];  Service: Endoscopy;  Laterality: N/A;   ESOPHAGOGASTRODUODENOSCOPY (EGD) WITH PROPOFOL N/A 10/26/2019   Procedure: ESOPHAGOGASTRODUODENOSCOPY (EGD) WITH BIOPSY;  Surgeon: [name], MD;  Location: [facility];  Service: Endoscopy;  Laterality: N/A;  sleep apnea   LAPAROSCOPIC PARTIAL COLECTOMY Right 11/17/2019   Procedure: LAPAROSCOPIC PARTIAL COLECTOMY RIGHT EXTENDED;  Surgeon: [name], MD;  Location: [facility];  Service: General;  Laterality: Right;   PORTACATH PLACEMENT N/A 12/28/2019   Procedure: INSERTION PORT-A-CATH Left  subclavian;  Surgeon: [name], MD;  Location: [facility];  Service: General;  Laterality: N/A;   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   Diabetes mellitus type 2, controlled, without complications (HCC) 08/07/2021   Neuropathy 08/30/2020   Port-A-Cath in place    Abnormal ECG 11/04/2019   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HCC) 10/27/2019   Goals of care, counseling/discussion 05/18/2019   Multiple myeloma (HCC) 05/17/2019   Anemia 05/12/2019   Arthritis of left ankle 02/05/2018   Cardiomyopathy, idiopathic (HCC) 01/07/2018   Moderate tricuspid insufficiency 01/01/2017   OSA (obstructive sleep apnea) 01/01/2017   Chronic a-fib (HCC) 12/06/2015   Pure hypercholesterolemia 02/09/2015   Anticoagulant long-term use 04/29/2014   Hyperlipidemia 04/29/2014   Hypertension, essential, benign 04/26/2014    ONSET DATE: 04/05/23  REFERRING DIAG: aphasia  THERAPY DIAG:  Aphasia  Cognitive communication deficit  Rationale for Evaluation and Treatment Rehabilitation  SUBJECTIVE:   SUBJECTIVE STATEMENT: Pt pleasant and cooperative. Very motivated to work with SLP. Conversing with neighbor in lobby with no apparent difficulty  Pt accompanied by: brother, in lobby  PERTINENT HISTORY: Patient is a 67 y.o. 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 Pt with PMHx of ca of the transverse colon, multiple myeloma, lymphoma, DM, HTN, HLD, CKD,  and recently diagnosed prostate cancer. Pt undergoing radiation tx for cancer and reports plan to possibly resume chemotherapy.   DIAGNOSTIC FINDINGS:  MRI brain 04/06/23, "MRI brain findings-Acute infarct in the left parietal lobe with small focus of infarct in the right temporal lobe (5:41, 5:38) evident on diffusion-weighted imaging and FLAIR sequences. A few nodular foci of low signal on susceptibility  weighted imaging in the region of left parietal infarct (for example 17:46) may represent small areas of microhemorrhage or cortical vessel thrombus.Mild cerebral volume loss with ex vacuo dilatation of the CSF-containing spaces. There are scattered in confluent foci of signal abnormality within the periventricular and deep white matter.  These are nonspecific but commonly seen with small vessel ischemic changes. There is no midline shift. No extra-axial fluid collection. No mass. There is no abnormal enhancement."  PAIN:  Are you having pain? No  FALLS: Has patient fallen in last 6 months?  No  LIVING ENVIRONMENT: Lives with: lives with their family since stroke; pt lived alone prior to stroke Lives in: House/apartment  PLOF:  Level of assistance: Independent with ADLs, Comment: prior to stroke; now reports needing assistance with iADLs  Employment: Retired   PATIENT GOALS    to be able to participate in complex conversation with family and friends; to understanding what he is reading; to improve QoL; to be more independent   OBJECTIVE:  TODAY'S TREATMENT: Pt seen for skilled SLP services targeting improve functional and receptive communication. Endorsed satisfaction in conversing briefly with neighbor in lobby. Attempted to set up [employer] app on pt's phone; however, not compatible with pt's personal phone. Pt agreeable to trial use of Constant Therapy app and agreed to allow SLP to set up for pt next session. Reviewed multimodal communication strategies including synonym/antonym use, semantic features analysis, circumlocution, and gesturing as a strategies to repair anomic event. Pt utilized during confrontation naming task with mod/max cueing and during informal conversational exchanges with mod/max cueing; although emerging use of strategies noted. Pt's speech appeared to be generally more fluent today, and pt stated that he feels "comfortable" working with SLP.    PATIENT  EDUCATION: Education details: changes to communication following CVA, aphasia, principles of neuroplasticity, introduction to community resources Person educated: Patient Education method: Explanation Education comprehension: verbalized understanding; needs further reinforcement  HOME EXERCISE PROGRAM:   X2 worksheets given to pt targeting wordfinding; pt working on Statistician for [employer] based applications    GOALS:  Goals reviewed with patient? Yes  SHORT TERM GOALS: Target date: 10 sessions  Patient will demonstrate improved expressive language skills during communication breakdowns by using multimodal means (semantic feature analysis, circumlocution, synonym/antonym use, gestures, writing, drawing) to repair with mod cues. Baseline: Goal status: INITIAL  2.  With Moderate A, patient will complete a semantic feature analysis with at least 3-5 relevant features for 8/10 target words with cues to improve word-finding skills.  Baseline:  Goal status: INITIAL  3.  With Moderate A, patient will generate sentences with 3 or more words during structured task at 70% accuracy in order to increase ability to communicate basic wants and needs.  Baseline:  Goal status: INITIAL  4.  Pt will participate in further assessment of functional reading and writing to help determine level of assistance needed for language based iADL tasks. Baseline:  Goal status: INITIAL  5.  Patient will follow two step commands 70% acc in context of a functional activity.  Baseline:  Goal status: INITIAL  LONG TERM GOALS: Target date:  12 weeks  Patient will demonstrate knowledge of appropriate activities to support functional receptive and expressive language outside of ST with assistance from family.  Baseline:  Goal status: INITIAL  2.  Patient will report improved confidence in conversations outside of home (appointments, friends, on the phone) than prior to ST, as measured by Communication  Effectiveness Survey.  Baseline:  15/32 Goal status: INITIAL  3.  Patient and/or family will report use of strategies outside of ST to improve communication (use of scripts, pre-planning, semantic feature analysis, supported conversation).  Baseline:  Goal status: INITIAL  4.  Patient will demonstrate improved ability with describing events, storytelling, or sharing medical information through personalized strategies (requesting time, using SFA, drawing, etc) as measured by Communication Effectiveness Survey.  Baseline:  Goal status: INITIAL   ASSESSMENT:  CLINICAL IMPRESSION: Patient is a 67 y.o. 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 Pt with PMHx of ca of the transverse colon, multiple myeloma, lymphoma, DM, HTN, HLD, CKD, and recently diagnosed prostate cancer. Pt was seen today for speech/language evaluation. Evaluation completed via Western Aphasia Battery Revised, diagnostic interviewing, and PROM. Pt's severity rating was moderate as indicated by an Aphasia Quotient of 70 on Western Aphasia Battery. 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 Reading is also impaired for moderately complex information. Writing TBA.  Pt's verbal expression is characterized by halting, telegraphic speech; mostly single words, paraphasias; occasional prepositional phrases; severe anomia. Pt with s/sx concomitant apraxia of speech as evidenced by halting speech patterns, multiple revisions, and articulatory groping which is most evident during repetition task. Pt reports vastly different functional receptive and expressive communication compared to baseline and reports "frustration" regarding CLOF. Pt enjoys in depth conversation and banter with family and friends and is also an avid reader. He reports significant difficulty with both. Although  cognition was not fully assessed suspect pt with working memory impairment as well. I recommend course of skilled SLP services targeting functional receptive and expressive communication to improve pt's participation in iADLs, preferred activities, and QoL.   OBJECTIVE IMPAIRMENTS include expressive language, receptive language, and aphasia. These impairments are limiting patient from managing medications, managing appointments, managing finances, household responsibilities, ADLs/IADLs, and effectively communicating at home and in community. Factors affecting potential to achieve goals and functional outcome are co-morbidities. Patient will benefit from skilled SLP services to address above impairments and improve overall function.  REHAB POTENTIAL: Excellent  PLAN: SLP FREQUENCY: 2x/week  SLP DURATION: 12 weeks  PLANNED INTERVENTIONS: Cueing hierachy, Internal/external aids, Functional tasks, Multimodal communication approach, SLP instruction and feedback, Compensatory strategies, and Patient/family education    Clyde Canterbury, M.S., CCC-SLP Speech-Language Pathologist  [facility] [address] [place], [place], [address] Phone: [phone]   Fax:  [phone]

## 2023-04-23 NOTE — Progress Notes (Signed)
[facility]  Telephone:([phone]) [phone] Fax:(336) [phone]  ID: Chris George OB: 11/13/1955  MR#: [id number]  AOZ#:[id number]  Patient Care Team: Marina Goodell, MD as PCP - General (Family Medicine) [name], MD as Consulting Physician (Oncology) Morene Crocker, MD as Referring Physician (Neurology) Lamar Blinks, MD as Consulting Physician (Cardiology)  CHIEF COMPLAINT: Recurrent colon cancer, smoldering myeloma, prostate cancer.  INTERVAL HISTORY: Patient returns to clinic today for hospital follow-up and treatment planning after being admitted to [facility] with a stroke.  He continues to have an expressive aphasia, but otherwise is fully recovered.  He continues to have a chronic peripheral neuropathy. He has no other neurologic complaints.  He denies any recent fevers or illnesses.  He has a good appetite and denies weight loss.  He has no chest pain, shortness of breath, cough, or hemoptysis.  He denies any nausea, vomiting, constipation, or diarrhea.  He has no melena or hematochezia.  He has no urinary complaints.  Patient offers no further specific complaints today.  REVIEW OF SYSTEMS:   Review of Systems  Constitutional: Negative.  Negative for fever, malaise/fatigue and weight loss.  Respiratory: Negative.  Negative for cough, hemoptysis and shortness of breath.   Cardiovascular: Negative.  Negative for chest pain and leg swelling.  Gastrointestinal: Negative.  Negative for abdominal pain, blood in stool and melena.  Genitourinary: Negative.  Negative for hematuria.  Musculoskeletal: Negative.  Negative for back pain and joint pain.  Skin: Negative.  Negative for rash.  Neurological:  Positive for tingling and sensory change. Negative for dizziness, focal weakness, weakness and headaches.  Psychiatric/Behavioral: Negative.  Negative for depression. The patient is not nervous/anxious.     As per HPI. Otherwise, a complete review of systems is  negative.  PAST MEDICAL HISTORY: Past Medical History:  Diagnosis Date   A-fib (HCC)    Anemia    Arthritis    ankles   Cancer (HCC)    multiple myeloma   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HCC) 10/26/2019   Cardiomyopathy (HCC)    Chemotherapy-induced peripheral neuropathy (HCC)    CHF (congestive heart failure) (HCC)    Depression    Dysrhythmia    atrial fib   HLD (hyperlipidemia)    Hypercholesteremia    Hypertension    Moderate tricuspid insufficiency    Multiple myeloma (HCC)    not being treated right now per pt 11/11/19   Pneumonia    Sleep apnea    No CPAP.  OSA resolved with wt loss.    PAST SURGICAL HISTORY: Past Surgical History:  Procedure Laterality Date   ATRIAL ABLATION SURGERY     CARDIAC SURGERY     A-Fib Ablations   COLONOSCOPY WITH PROPOFOL N/A 10/26/2019   Procedure: COLONOSCOPY WITH BIOPSY;  Surgeon: [name], MD;  Location: [facility];  Service: Endoscopy;  Laterality: N/A;   COLONOSCOPY WITH PROPOFOL N/A 09/03/2022   Procedure: COLONOSCOPY WITH PROPOFOL;  Surgeon: [name], MD;  Location: [facility];  Service: Endoscopy;  Laterality: N/A;   ESOPHAGOGASTRODUODENOSCOPY (EGD) WITH PROPOFOL N/A 10/26/2019   Procedure: ESOPHAGOGASTRODUODENOSCOPY (EGD) WITH BIOPSY;  Surgeon: [name], MD;  Location: [facility];  Service: Endoscopy;  Laterality: N/A;  sleep apnea   LAPAROSCOPIC PARTIAL COLECTOMY Right 11/17/2019   Procedure: LAPAROSCOPIC PARTIAL COLECTOMY RIGHT EXTENDED;  Surgeon: [name], MD;  Location: [facility];  Service: General;  Laterality: Right;   PORTACATH PLACEMENT N/A 12/28/2019   Procedure: INSERTION PORT-A-CATH Left subclavian;  Surgeon: [name], MD;  Location: [facility];  Service: General;  Laterality: N/A;    FAMILY HISTORY: Family History  Problem Relation Age of Onset   Healthy Mother     ADVANCED DIRECTIVES (Y/N):  N  HEALTH MAINTENANCE: Social History   Tobacco Use   Smoking status:  Never    Passive exposure: Never   Smokeless tobacco: Never  Vaping Use   Vaping Use: Never used  Substance Use Topics   Alcohol use: Not Currently   Drug use: Never     Colonoscopy:  PAP:  Bone density:  Lipid panel:  No Known Allergies  Current Outpatient Medications  Medication Sig Dispense Refill   allopurinol (ZYLOPRIM) 100 MG tablet Take 100 mg by mouth daily.     apixaban (ELIQUIS) 5 MG TABS tablet Take 5 mg by mouth 2 (two) times daily.     APPLE CIDER VINEGAR PO Take 30-45 mLs by mouth every other day.      atorvastatin (LIPITOR) 80 MG tablet Take 80 mg by mouth daily.     colchicine 0.6 MG tablet Take 1 tablet (0.6 mg total) by mouth 2 (two) times daily. Until flare resolves. 20 tablet 1   furosemide (LASIX) 80 MG tablet Take 80 mg by mouth daily. Morning & late afternoon     Misc Natural Products (TART CHERRY ADVANCED PO) Take 60 mLs by mouth every other day.     Multiple Vitamins-Minerals (ZINC PO) Take by mouth.     potassium bicarbonate (K-LYTE) 25 MEQ disintegrating tablet Take 25 mEq by mouth 4 (four) times a week. In the afternoon.     Zinc Sulfate (ZINC 15 PO) Take by mouth.     digoxin (LANOXIN) 0.25 MG tablet Take 0.25 mg by mouth daily.  (Patient not taking: Reported on 04/23/2023)     MAGNESIUM PO Take by mouth. (Patient not taking: Reported on 04/23/2023)     prochlorperazine (COMPAZINE) 10 MG tablet Take 1 tablet (10 mg total) by mouth every 6 (six) hours as needed for nausea or vomiting. 60 tablet 1   No current facility-administered medications for this visit.    OBJECTIVE: Vitals:   04/23/23 1030  BP: 112/61  Pulse: (!) 46  Resp: 16  Temp: (!) 96.8 F (36 C)  SpO2: 98%      Body mass index is 31.6 kg/m.    ECOG FS:0 - Asymptomatic  General: Well-developed, well-nourished, no acute distress. Eyes: Pink conjunctiva, anicteric sclera. HEENT: Normocephalic, moist mucous membranes. Lungs: No audible wheezing or coughing. Heart: Regular rate and  rhythm. Abdomen: Soft, nontender, no obvious distention. Musculoskeletal: No edema, cyanosis, or clubbing. Neuro: Alert, answering all questions appropriately. Cranial nerves grossly intact. Skin: No rashes or petechiae noted. Psych: Normal affect.  LAB RESULTS:  Lab Results  Component Value Date   NA 135 02/22/2023   K 3.5 02/22/2023   CL 99 02/22/2023   CO2 27 02/22/2023   GLUCOSE 161 (H) 02/22/2023   BUN 18 02/22/2023   CREATININE 1.82 (H) 02/22/2023   CALCIUM 8.8 (L) 02/22/2023   PROT 8.0 02/22/2023   ALBUMIN 3.8 02/22/2023   AST 42 (H) 02/22/2023   ALT 30 02/22/2023   ALKPHOS 143 (H) 02/22/2023   BILITOT 0.9 02/22/2023   GFRNONAA 40 (L) 02/22/2023   GFRAA >60 07/29/2020    Lab Results  Component Value Date   WBC 2.8 (L) 02/22/2023   NEUTROABS 1.8 02/22/2023   HGB 11.5 (L) 02/22/2023   HCT 35.6 (L) 02/22/2023  MCV 88.3 02/22/2023   PLT 126 (L) 02/22/2023   Lab Results  Component Value Date   IRON 41 (L) 05/11/2020   TIBC 323 05/11/2020   IRONPCTSAT 13 (L) 05/11/2020   Lab Results  Component Value Date   FERRITIN 86 05/11/2020     STUDIES: No results found.   ASSESSMENT: Stage IIIc colon cancer, smoldering myeloma, prostate cancer.  PLAN:    Stage IIIc colon cancer: Patient completed cycle 9 of adjuvant FOLFOX on June 03, 2020.  Given his difficulties with treatment and declining performance status, treatment was discontinued altogether. Colonoscopy on September 03, 2022 did not reveal any significant pathology and recommendation was to repeat in 3 years.  CT scan from December 07, 2022 reviewed independently with enlarging aortocaval node now measuring 1.9 x 1.4 cm.  PET scan results from Mar 15, 2023 reviewed independently and reported as above with progressive retroperitoneal lymphadenopathy presumably related to patient's colon cancer.  His CEA also continues to trend up and is now 194.  Patient has agreed to reinitiate chemotherapy with FOLFIRI which  was delayed secondary to his CVA.  Will hold Avastin at this time given his recent stroke.  Return to clinic on May 15, 2023 to initiate cycle 1.  Prostate cancer: Gleason's 3+4.  Patient's PSA is increased, but relatively stable at 32.4.  Imaging as above.  Nuclear medicine bone scan on December 13, 2022 was reported as negative.  PSMA PET per radiation oncology from Mar 21, 2023 did not reveal metastatic disease.  Patient is now undergoing XRT for local control disease and will complete treatment on May 30, 2023.  Smoldering myeloma: Chronic and unchanged.  Patient was initially diagnosed and treated in [place], [place] and treated with single agent Velcade in approximately 2013.  He declined maintenance treatment or referral for bone marrow transplant.  His M spike has ranged from 1.1-2.0 since July 2020.  His most recent result was 1.6.  Over the same timeframe his IgG component has ranged from [phone].  His most recent result is 2344.  Finally, his kappa free light chains have ranged from 24.8 -75.3 with his most recent result reported at 36.2.  Nuclear med bone scan as above.  His most recent bone marrow biopsy completed on May 28, 2019 revealed only 10 to 15% plasma cells with no clonality reported.  Cytogenetics were also reported as normal.  Continue to monitor closely.  Anemia: Chronic and unchanged.  Patient's most recent hemoglobin is 11.5.  He last received IV Feraheme on October 06, 2019.   Renal insufficiency: Chronic and unchanged.  Patient's most recent creatinine is 122.  Continue follow-up with nephrology as indicated.   Peripheral neuropathy: Patient reports gabapentin did not help.  He was recently initiated on Lyrica 50 mg daily and given a referral to neurology.   Leukopenia: Mild, monitor. Thrombocytopenia: Chronic and unchanged.  Patient's most recent platelet count was 126.    I spent a total of 30 minutes reviewing chart data, face-to-face evaluation with the patient,  counseling and coordination of care as detailed above.   Patient expressed understanding and was in agreement with this plan. He also understands that He can call clinic at any time with any questions, concerns, or complaints.    [name], MD   04/23/2023 1:12 PM

## 2023-04-23 NOTE — Progress Notes (Signed)
ON PATHWAY REGIMEN - Colorectal  No Change  Continue With Treatment as Ordered.  Original Decision Date/Time: 03/18/2023 13:50     A cycle is every 14 days:     Irinotecan      Leucovorin      Fluorouracil      Fluorouracil   **Always confirm dose/schedule in your pharmacy ordering system**  Patient Characteristics: Distant Metastases, Nonsurgical Candidate, Non-KRAS G12C, RAS Mutation Positive/Unknown (BRAF V600 Wild-Type/Unknown), Standard Cytotoxic Therapy, Second Line Standard Cytotoxic Therapy, Bevacizumab Ineligible Tumor Location: Colon Therapeutic Status: Distant Metastases Microsatellite/Mismatch Repair Status: Unknown BRAF Mutation Status: Did Not Order Test KRAS/NRAS Mutation Status: Did Not Order Test Preferred Therapy Approach: Standard Cytotoxic Therapy Standard Cytotoxic Line of Therapy: Second Line Standard Cytotoxic Therapy Bevacizumab Eligibility: Ineligible Intent of Therapy: Non-Curative / Palliative Intent, Discussed with Patient

## 2023-04-24 ENCOUNTER — Ambulatory Visit
Admission: RE | Admit: 2023-04-24 | Discharge: 2023-04-24 | Disposition: A | Discharge status: Home / Self Care | Payer: Medicare HMO | Source: Ambulatory Visit | Attending: Radiation Oncology | Admitting: Radiation Oncology

## 2023-04-24 ENCOUNTER — Encounter: Payer: Medicare HMO | Admitting: Speech Pathology

## 2023-04-24 ENCOUNTER — Other Ambulatory Visit: Payer: Self-pay

## 2023-04-24 DIAGNOSIS — C61 Malignant neoplasm of prostate: Secondary | ICD-10-CM | POA: Diagnosis not present

## 2023-04-24 LAB — RAD ONC ARIA SESSION SUMMARY
Course Elapsed Days: 2
Plan Fractions Treated to Date: 3
Plan Prescribed Dose Per Fraction: 2.5 Gy
Plan Total Fractions Prescribed: 28
Plan Total Prescribed Dose: 70 Gy
Reference Point Dosage Given to Date: 7.5 Gy
Reference Point Session Dosage Given: 2.5 Gy
Session Number: 3

## 2023-04-25 ENCOUNTER — Other Ambulatory Visit: Payer: Self-pay

## 2023-04-25 ENCOUNTER — Encounter: Payer: Self-pay | Admitting: Oncology

## 2023-04-25 ENCOUNTER — Ambulatory Visit
Admission: RE | Admit: 2023-04-25 | Discharge: 2023-04-25 | Disposition: A | Discharge status: Home / Self Care | Payer: Medicare HMO | Source: Ambulatory Visit | Attending: Radiation Oncology | Admitting: Radiation Oncology

## 2023-04-25 DIAGNOSIS — C61 Malignant neoplasm of prostate: Secondary | ICD-10-CM | POA: Diagnosis not present

## 2023-04-25 LAB — RAD ONC ARIA SESSION SUMMARY
Course Elapsed Days: 3
Plan Fractions Treated to Date: 4
Plan Prescribed Dose Per Fraction: 2.5 Gy
Plan Total Fractions Prescribed: 28
Plan Total Prescribed Dose: 70 Gy
Reference Point Dosage Given to Date: 10 Gy
Reference Point Session Dosage Given: 2.5 Gy
Session Number: 4

## 2023-04-26 ENCOUNTER — Ambulatory Visit
Admission: RE | Admit: 2023-04-26 | Discharge: 2023-04-26 | Disposition: A | Discharge status: Home / Self Care | Payer: Medicare HMO | Source: Ambulatory Visit | Attending: Radiation Oncology | Admitting: Radiation Oncology

## 2023-04-26 ENCOUNTER — Other Ambulatory Visit: Payer: Self-pay

## 2023-04-26 DIAGNOSIS — C61 Malignant neoplasm of prostate: Secondary | ICD-10-CM | POA: Diagnosis not present

## 2023-04-26 LAB — RAD ONC ARIA SESSION SUMMARY
Course Elapsed Days: 4
Plan Fractions Treated to Date: 5
Plan Prescribed Dose Per Fraction: 2.5 Gy
Plan Total Fractions Prescribed: 28
Plan Total Prescribed Dose: 70 Gy
Reference Point Dosage Given to Date: 12.5 Gy
Reference Point Session Dosage Given: 2.5 Gy
Session Number: 5

## 2023-04-29 ENCOUNTER — Ambulatory Visit: Payer: Medicare HMO | Admitting: Speech Pathology

## 2023-04-29 ENCOUNTER — Other Ambulatory Visit: Payer: Self-pay

## 2023-04-29 ENCOUNTER — Ambulatory Visit
Admission: RE | Admit: 2023-04-29 | Discharge: 2023-04-29 | Disposition: A | Discharge status: Home / Self Care | Payer: Medicare HMO | Source: Ambulatory Visit | Attending: Radiation Oncology | Admitting: Radiation Oncology

## 2023-04-29 DIAGNOSIS — C61 Malignant neoplasm of prostate: Secondary | ICD-10-CM | POA: Diagnosis not present

## 2023-04-29 LAB — RAD ONC ARIA SESSION SUMMARY
Course Elapsed Days: 7
Plan Fractions Treated to Date: 6
Plan Prescribed Dose Per Fraction: 2.5 Gy
Plan Total Fractions Prescribed: 28
Plan Total Prescribed Dose: 70 Gy
Reference Point Dosage Given to Date: 15 Gy
Reference Point Session Dosage Given: 2.5 Gy
Session Number: 6

## 2023-04-30 ENCOUNTER — Other Ambulatory Visit: Payer: Self-pay

## 2023-04-30 ENCOUNTER — Ambulatory Visit
Admission: RE | Admit: 2023-04-30 | Discharge: 2023-04-30 | Disposition: A | Discharge status: Home / Self Care | Payer: Medicare HMO | Source: Ambulatory Visit | Attending: Radiation Oncology | Admitting: Radiation Oncology

## 2023-04-30 DIAGNOSIS — C61 Malignant neoplasm of prostate: Secondary | ICD-10-CM | POA: Diagnosis not present

## 2023-04-30 LAB — RAD ONC ARIA SESSION SUMMARY
Course Elapsed Days: 8
Plan Fractions Treated to Date: 7
Plan Prescribed Dose Per Fraction: 2.5 Gy
Plan Total Fractions Prescribed: 28
Plan Total Prescribed Dose: 70 Gy
Reference Point Dosage Given to Date: 17.5 Gy
Reference Point Session Dosage Given: 2.5 Gy
Session Number: 7

## 2023-05-01 ENCOUNTER — Ambulatory Visit
Admission: RE | Admit: 2023-05-01 | Discharge: 2023-05-01 | Disposition: A | Discharge status: Home / Self Care | Payer: Medicare HMO | Source: Ambulatory Visit | Attending: Radiation Oncology | Admitting: Radiation Oncology

## 2023-05-01 ENCOUNTER — Other Ambulatory Visit: Payer: Self-pay

## 2023-05-01 DIAGNOSIS — C61 Malignant neoplasm of prostate: Secondary | ICD-10-CM | POA: Diagnosis not present

## 2023-05-01 LAB — RAD ONC ARIA SESSION SUMMARY
Course Elapsed Days: 9
Plan Fractions Treated to Date: 8
Plan Prescribed Dose Per Fraction: 2.5 Gy
Plan Total Fractions Prescribed: 28
Plan Total Prescribed Dose: 70 Gy
Reference Point Dosage Given to Date: 20 Gy
Reference Point Session Dosage Given: 2.5 Gy
Session Number: 8

## 2023-05-02 ENCOUNTER — Other Ambulatory Visit: Payer: Self-pay

## 2023-05-02 ENCOUNTER — Ambulatory Visit
Admission: RE | Admit: 2023-05-02 | Discharge: 2023-05-02 | Disposition: A | Discharge status: Home / Self Care | Payer: Medicare HMO | Source: Ambulatory Visit | Attending: Radiation Oncology | Admitting: Radiation Oncology

## 2023-05-02 ENCOUNTER — Ambulatory Visit: Payer: Medicare HMO | Admitting: Speech Pathology

## 2023-05-02 DIAGNOSIS — R4701 Aphasia: Secondary | ICD-10-CM

## 2023-05-02 DIAGNOSIS — R41841 Cognitive communication deficit: Secondary | ICD-10-CM

## 2023-05-02 DIAGNOSIS — C61 Malignant neoplasm of prostate: Secondary | ICD-10-CM | POA: Diagnosis not present

## 2023-05-02 LAB — RAD ONC ARIA SESSION SUMMARY
Course Elapsed Days: 10
Plan Fractions Treated to Date: 9
Plan Prescribed Dose Per Fraction: 2.5 Gy
Plan Total Fractions Prescribed: 28
Plan Total Prescribed Dose: 70 Gy
Reference Point Dosage Given to Date: 22.5 Gy
Reference Point Session Dosage Given: 2.5 Gy
Session Number: 9

## 2023-05-02 NOTE — Therapy (Signed)
OUTPATIENT SPEECH LANGUAGE PATHOLOGY APHASIA TREATMENT   Patient Name: Chris George MRN: [id number] DOB:Mar 08, 1956, 67 y.o., male Today's Date: 05/02/2023  PCP: Vallarie Mare REFERRING PROVIDER: Maryjane Hurter, MD   End of Session - 05/02/23 1316     Visit Number 4    Number of Visits 24    Date for SLP Re-Evaluation 07/11/23             Past Medical History:  Diagnosis Date   A-fib (HCC)    Anemia    Arthritis    ankles   Cancer (HCC)    multiple myeloma   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HCC) 10/26/2019   Cardiomyopathy (HCC)    Chemotherapy-induced peripheral neuropathy (HCC)    CHF (congestive heart failure) (HCC)    Depression    Dysrhythmia    atrial fib   HLD (hyperlipidemia)    Hypercholesteremia    Hypertension    Moderate tricuspid insufficiency    Multiple myeloma (HCC)    not being treated right now per pt 11/11/19   Pneumonia    Sleep apnea    No CPAP.  OSA resolved with wt loss.   Past Surgical History:  Procedure Laterality Date   ATRIAL ABLATION SURGERY     CARDIAC SURGERY     A-Fib Ablations   COLONOSCOPY WITH PROPOFOL N/A 10/26/2019   Procedure: COLONOSCOPY WITH BIOPSY;  Surgeon: [name], MD;  Location: [facility];  Service: Endoscopy;  Laterality: N/A;   COLONOSCOPY WITH PROPOFOL N/A 09/03/2022   Procedure: COLONOSCOPY WITH PROPOFOL;  Surgeon: [name], MD;  Location: [facility];  Service: Endoscopy;  Laterality: N/A;   ESOPHAGOGASTRODUODENOSCOPY (EGD) WITH PROPOFOL N/A 10/26/2019   Procedure: ESOPHAGOGASTRODUODENOSCOPY (EGD) WITH BIOPSY;  Surgeon: [name], MD;  Location: [facility];  Service: Endoscopy;  Laterality: N/A;  sleep apnea   LAPAROSCOPIC PARTIAL COLECTOMY Right 11/17/2019   Procedure: LAPAROSCOPIC PARTIAL COLECTOMY RIGHT EXTENDED;  Surgeon: [name], MD;  Location: [facility];  Service: General;  Laterality: Right;   PORTACATH PLACEMENT N/A 12/28/2019   Procedure: INSERTION PORT-A-CATH Left  subclavian;  Surgeon: [name], MD;  Location: [facility];  Service: General;  Laterality: N/A;   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   Diabetes mellitus type 2, controlled, without complications (HCC) 08/07/2021   Neuropathy 08/30/2020   Port-A-Cath in place    Abnormal ECG 11/04/2019   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HCC) 10/27/2019   Goals of care, counseling/discussion 05/18/2019   Multiple myeloma (HCC) 05/17/2019   Anemia 05/12/2019   Arthritis of left ankle 02/05/2018   Cardiomyopathy, idiopathic (HCC) 01/07/2018   Moderate tricuspid insufficiency 01/01/2017   OSA (obstructive sleep apnea) 01/01/2017   Chronic a-fib (HCC) 12/06/2015   Pure hypercholesterolemia 02/09/2015   Anticoagulant long-term use 04/29/2014   Hyperlipidemia 04/29/2014   Hypertension, essential, benign 04/26/2014    ONSET DATE: 04/05/23  REFERRING DIAG: aphasia  THERAPY DIAG:  Aphasia  Cognitive communication deficit  Rationale for Evaluation and Treatment Rehabilitation  SUBJECTIVE:   SUBJECTIVE STATEMENT: Pt pleasant and cooperative. Very motivated to work with SLP.  Pt accompanied by: self  PERTINENT HISTORY: Patient is a 67 y.o. 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 Pt with PMHx of ca of the transverse colon, multiple myeloma, lymphoma, DM, HTN, HLD, CKD, and recently diagnosed prostate  cancer. Pt undergoing radiation tx for cancer and reports plan to possibly resume chemotherapy.   DIAGNOSTIC FINDINGS:  MRI brain 04/06/23, "MRI brain findings-Acute infarct in the left parietal lobe with small focus of infarct in the right temporal lobe (5:41, 5:38) evident on diffusion-weighted imaging and FLAIR sequences. A few nodular foci of low signal on susceptibility weighted imaging in the  region of left parietal infarct (for example 17:46) may represent small areas of microhemorrhage or cortical vessel thrombus.Mild cerebral volume loss with ex vacuo dilatation of the CSF-containing spaces. There are scattered in confluent foci of signal abnormality within the periventricular and deep white matter.  These are nonspecific but commonly seen with small vessel ischemic changes. There is no midline shift. No extra-axial fluid collection. No mass. There is no abnormal enhancement."  PAIN:  Are Chris having pain? No  FALLS: Has patient fallen in last 6 months?  No  LIVING ENVIRONMENT: Lives with: lives with their family since stroke; pt lived alone prior to stroke Lives in: House/apartment  PLOF:  Level of assistance: Independent with ADLs, Comment: prior to stroke; now reports needing assistance with iADLs  Employment: Retired   PATIENT GOALS    to be able to participate in complex conversation with family and friends; to understanding what he is reading; to improve QoL; to be more independent   OBJECTIVE:   TODAY'S TREATMENT:  Pt seen for skilled SLP services targeting improve functional and receptive communication. Endorsed improving confidence in his ability to communicate with family and friends; however, reports continued frustration with speech/language deficits. Supportive counseling provided. Constant Therapy app set up on pt's phone for HEP. Pt agreeable to trial for next 2 weeks. Reviewed multimodal communication strategies including synonym/antonym use, semantic features analysis, circumlocution, and gesturing as a strategies to repair anomic event. Pt utilized during informal conversational exchanges with mod/max cueing; although emerging use of strategies noted. Pt's speech appeared to be generally more fluent today. Pt with successful repair of 4/8 anomic events during ~15 minutes of conversation. Pt also eager to join [employer] once pt's medical  appointments become less demanding as pt currently undergoing radiation tx and is planning to start chemotherapy.      PATIENT EDUCATION: Education details: HEP, progress to date, communication strategies Person educated: Patient Education method: Explanation Education comprehension: verbalized understanding; needs further reinforcement  HOME EXERCISE PROGRAM:   Constant Therapy set up on pt's telephone for HEP. Pt has worksheets from previous sessions to work on.    GOALS:  Goals reviewed with patient? Yes  SHORT TERM GOALS: Target date: 10 sessions  Patient will demonstrate improved expressive language skills during communication breakdowns by using multimodal means (semantic feature analysis, circumlocution, synonym/antonym use, gestures, writing, drawing) to repair with mod cues. Baseline: Goal status: INITIAL  2.  With Moderate A, patient will complete a semantic feature analysis with at least 3-5 relevant features for 8/10 target words with cues to improve word-finding skills.  Baseline:  Goal status: INITIAL  3.  With Moderate A, patient will generate sentences with 3 or more words during structured task at 70% accuracy in order to increase ability to communicate basic wants and needs.  Baseline:  Goal status: INITIAL  4.  Pt will participate in further assessment of functional reading and writing to help determine level of assistance needed for language based iADL tasks. Baseline:  Goal status: INITIAL  5.  Patient will follow two step commands 70% acc in context of a functional activity.  Baseline:  Goal status: INITIAL  LONG TERM GOALS: Target date: 12 weeks  Patient will demonstrate knowledge of appropriate activities to support functional receptive and expressive language outside of ST with assistance from family.  Baseline:  Goal status: INITIAL  2.  Patient will report improved confidence in conversations outside of home (appointments, friends, on the  phone) than prior to ST, as measured by Communication Effectiveness Survey.  Baseline:  15/32 Goal status: INITIAL  3.  Patient and/or family will report use of strategies outside of ST to improve communication (use of scripts, pre-planning, semantic feature analysis, supported conversation).  Baseline:  Goal status: INITIAL  4.  Patient will demonstrate improved ability with describing events, storytelling, or sharing medical information through personalized strategies (requesting time, using SFA, drawing, etc) as measured by Communication Effectiveness Survey.  Baseline:  Goal status: INITIAL   ASSESSMENT:  CLINICAL IMPRESSION: Patient is a 67 y.o. 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 Pt with PMHx of ca of the transverse colon, multiple myeloma, lymphoma, DM, HTN, HLD, CKD, and recently diagnosed prostate cancer. Pt was seen today for speech/language evaluation. Evaluation completed via Western Aphasia Battery Revised, diagnostic interviewing, and PROM. Pt's severity rating was moderate as indicated by an Aphasia Quotient of 70 on Western Aphasia Battery. 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 Reading is also impaired for moderately complex information. Writing TBA.  Pt's verbal expression is characterized by halting, telegraphic speech; mostly single words, paraphasias; occasional prepositional phrases; severe anomia. Pt with s/sx concomitant apraxia of speech as evidenced by halting speech patterns, multiple revisions, and articulatory groping which is most evident during repetition task. Pt reports vastly different functional receptive and expressive communication compared to baseline and reports "frustration" regarding CLOF. Pt enjoys in depth conversation and banter with family and friends and is also an avid reader. He  reports significant difficulty with both. Although cognition was not fully assessed suspect pt with working memory impairment as well. I recommend course of skilled SLP services targeting functional receptive and expressive communication to improve pt's participation in iADLs, preferred activities, and QoL.   OBJECTIVE IMPAIRMENTS include expressive language, receptive language, and aphasia. These impairments are limiting patient from managing medications, managing appointments, managing finances, household responsibilities, ADLs/IADLs, and effectively communicating at home and in community. Factors affecting potential to achieve goals and functional outcome are co-morbidities. Patient will benefit from skilled SLP services to address above impairments and improve overall function.  REHAB POTENTIAL: Excellent  PLAN: SLP FREQUENCY: 2x/week  SLP DURATION: 12 weeks  PLANNED INTERVENTIONS: Cueing hierachy, Internal/external aids, Functional tasks, Multimodal communication approach, SLP instruction and feedback, Compensatory strategies, and Patient/family education    Clyde Canterbury, M.S., CCC-SLP Speech-Language Pathologist  [facility] at [facility] [address] [place], [place], [address] Phone: [phone]   Fax:  [phone]

## 2023-05-03 ENCOUNTER — Ambulatory Visit
Admission: RE | Admit: 2023-05-03 | Discharge: 2023-05-03 | Disposition: A | Discharge status: Home / Self Care | Payer: Medicare HMO | Source: Ambulatory Visit | Attending: Radiation Oncology | Admitting: Radiation Oncology

## 2023-05-03 ENCOUNTER — Other Ambulatory Visit: Payer: Self-pay

## 2023-05-03 DIAGNOSIS — C61 Malignant neoplasm of prostate: Secondary | ICD-10-CM | POA: Diagnosis not present

## 2023-05-03 LAB — RAD ONC ARIA SESSION SUMMARY
Course Elapsed Days: 11
Plan Fractions Treated to Date: 10
Plan Prescribed Dose Per Fraction: 2.5 Gy
Plan Total Fractions Prescribed: 28
Plan Total Prescribed Dose: 70 Gy
Reference Point Dosage Given to Date: 25 Gy
Reference Point Session Dosage Given: 2.5 Gy
Session Number: 10

## 2023-05-06 ENCOUNTER — Ambulatory Visit
Admission: RE | Admit: 2023-05-06 | Discharge: 2023-05-06 | Disposition: A | Discharge status: Home / Self Care | Payer: Medicare HMO | Source: Ambulatory Visit | Attending: Radiation Oncology | Admitting: Radiation Oncology

## 2023-05-06 ENCOUNTER — Inpatient Hospital Stay: Payer: Medicare HMO | Attending: Oncology

## 2023-05-06 ENCOUNTER — Other Ambulatory Visit: Payer: Self-pay

## 2023-05-06 DIAGNOSIS — Z79899 Other long term (current) drug therapy: Secondary | ICD-10-CM | POA: Insufficient documentation

## 2023-05-06 DIAGNOSIS — N289 Disorder of kidney and ureter, unspecified: Secondary | ICD-10-CM | POA: Diagnosis not present

## 2023-05-06 DIAGNOSIS — C61 Malignant neoplasm of prostate: Secondary | ICD-10-CM

## 2023-05-06 DIAGNOSIS — C186 Malignant neoplasm of descending colon: Secondary | ICD-10-CM | POA: Insufficient documentation

## 2023-05-06 DIAGNOSIS — C184 Malignant neoplasm of transverse colon: Secondary | ICD-10-CM | POA: Insufficient documentation

## 2023-05-06 DIAGNOSIS — Z5111 Encounter for antineoplastic chemotherapy: Secondary | ICD-10-CM | POA: Diagnosis present

## 2023-05-06 LAB — RAD ONC ARIA SESSION SUMMARY
Course Elapsed Days: 14
Plan Fractions Treated to Date: 11
Plan Prescribed Dose Per Fraction: 2.5 Gy
Plan Total Fractions Prescribed: 28
Plan Total Prescribed Dose: 70 Gy
Reference Point Dosage Given to Date: 27.5 Gy
Reference Point Session Dosage Given: 2.5 Gy
Session Number: 11

## 2023-05-06 LAB — CBC (CANCER CENTER ONLY)
HCT: 33.8 % — ABNORMAL LOW (ref 39.0–52.0)
Hemoglobin: 10.8 g/dL — ABNORMAL LOW (ref 13.0–17.0)
MCH: 28.1 pg (ref 26.0–34.0)
MCHC: 32 g/dL (ref 30.0–36.0)
MCV: 88 fL (ref 80.0–100.0)
Platelet Count: 110 10*3/uL — ABNORMAL LOW (ref 150–400)
RBC: 3.84 MIL/uL — ABNORMAL LOW (ref 4.22–5.81)
RDW: 14.4 % (ref 11.5–15.5)
WBC Count: 4.3 10*3/uL (ref 4.0–10.5)
nRBC: 0 % (ref 0.0–0.2)

## 2023-05-07 ENCOUNTER — Other Ambulatory Visit: Payer: Self-pay

## 2023-05-07 ENCOUNTER — Other Ambulatory Visit: Payer: Self-pay | Admitting: Oncology

## 2023-05-07 ENCOUNTER — Encounter: Payer: Medicare HMO | Admitting: Speech Pathology

## 2023-05-07 ENCOUNTER — Ambulatory Visit
Admission: RE | Admit: 2023-05-07 | Discharge: 2023-05-07 | Disposition: A | Discharge status: Home / Self Care | Payer: Medicare HMO | Source: Ambulatory Visit | Attending: Radiation Oncology | Admitting: Radiation Oncology

## 2023-05-07 DIAGNOSIS — C61 Malignant neoplasm of prostate: Secondary | ICD-10-CM | POA: Diagnosis not present

## 2023-05-07 LAB — RAD ONC ARIA SESSION SUMMARY
Course Elapsed Days: 15
Plan Fractions Treated to Date: 12
Plan Prescribed Dose Per Fraction: 2.5 Gy
Plan Total Fractions Prescribed: 28
Plan Total Prescribed Dose: 70 Gy
Reference Point Dosage Given to Date: 30 Gy
Reference Point Session Dosage Given: 2.5 Gy
Session Number: 12

## 2023-05-08 ENCOUNTER — Other Ambulatory Visit: Payer: Self-pay

## 2023-05-08 ENCOUNTER — Ambulatory Visit: Payer: Medicare HMO | Attending: Family Medicine | Admitting: Speech Pathology

## 2023-05-08 ENCOUNTER — Ambulatory Visit
Admission: RE | Admit: 2023-05-08 | Discharge: 2023-05-08 | Disposition: A | Discharge status: Home / Self Care | Payer: Medicare HMO | Source: Ambulatory Visit | Attending: Radiation Oncology | Admitting: Radiation Oncology

## 2023-05-08 DIAGNOSIS — R41841 Cognitive communication deficit: Secondary | ICD-10-CM | POA: Diagnosis present

## 2023-05-08 DIAGNOSIS — R4701 Aphasia: Secondary | ICD-10-CM | POA: Insufficient documentation

## 2023-05-08 DIAGNOSIS — C61 Malignant neoplasm of prostate: Secondary | ICD-10-CM | POA: Diagnosis not present

## 2023-05-08 LAB — RAD ONC ARIA SESSION SUMMARY
Course Elapsed Days: 16
Plan Fractions Treated to Date: 13
Plan Prescribed Dose Per Fraction: 2.5 Gy
Plan Total Fractions Prescribed: 28
Plan Total Prescribed Dose: 70 Gy
Reference Point Dosage Given to Date: 32.5 Gy
Reference Point Session Dosage Given: 2.5 Gy
Session Number: 13

## 2023-05-08 NOTE — Therapy (Signed)
OUTPATIENT SPEECH LANGUAGE PATHOLOGY APHASIA TREATMENT   Patient Name: Chris George MRN: [id number] DOB:07/03/1956, 67 y.o., male Today's Date: 05/08/2023  PCP: Vallarie Mare REFERRING PROVIDER: Maryjane Hurter, MD   End of Session - 05/08/23 1243    Visit Number 4      Number of Visits 24     Date for SLP Re-Evaluation 07/11/23     SLP Start Time 1145    SLP Stop Time  1240    SLP Time Calculation (min) 55 min             Past Medical History:  Diagnosis Date   A-fib (HCC)    Anemia    Arthritis    ankles   Cancer (HCC)    multiple myeloma   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HCC) 10/26/2019   Cardiomyopathy (HCC)    Chemotherapy-induced peripheral neuropathy (HCC)    CHF (congestive heart failure) (HCC)    Depression    Dysrhythmia    atrial fib   HLD (hyperlipidemia)    Hypercholesteremia    Hypertension    Moderate tricuspid insufficiency    Multiple myeloma (HCC)    not being treated right now per pt 11/11/19   Pneumonia    Sleep apnea    No CPAP.  OSA resolved with wt loss.   Past Surgical History:  Procedure Laterality Date   ATRIAL ABLATION SURGERY     CARDIAC SURGERY     A-Fib Ablations   COLONOSCOPY WITH PROPOFOL N/A 10/26/2019   Procedure: COLONOSCOPY WITH BIOPSY;  Surgeon: [name], MD;  Location: [facility];  Service: Endoscopy;  Laterality: N/A;   COLONOSCOPY WITH PROPOFOL N/A 09/03/2022   Procedure: COLONOSCOPY WITH PROPOFOL;  Surgeon: [name], MD;  Location: [facility];  Service: Endoscopy;  Laterality: N/A;   ESOPHAGOGASTRODUODENOSCOPY (EGD) WITH PROPOFOL N/A 10/26/2019   Procedure: ESOPHAGOGASTRODUODENOSCOPY (EGD) WITH BIOPSY;  Surgeon: [name], MD;  Location: [facility];  Service: Endoscopy;  Laterality: N/A;  sleep apnea   LAPAROSCOPIC PARTIAL COLECTOMY Right 11/17/2019   Procedure: LAPAROSCOPIC PARTIAL COLECTOMY RIGHT EXTENDED;  Surgeon: [name], MD;  Location: [facility];  Service: General;  Laterality:  Right;   PORTACATH PLACEMENT N/A 12/28/2019   Procedure: INSERTION PORT-A-CATH Left subclavian;  Surgeon: [name], MD;  Location: [facility];  Service: General;  Laterality: N/A;   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   Diabetes mellitus type 2, controlled, without complications (HCC) 08/07/2021   Neuropathy 08/30/2020   Port-A-Cath in place    Abnormal ECG 11/04/2019   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HCC) 10/27/2019   Goals of care, counseling/discussion 05/18/2019   Multiple myeloma (HCC) 05/17/2019   Anemia 05/12/2019   Arthritis of left ankle 02/05/2018   Cardiomyopathy, idiopathic (HCC) 01/07/2018   Moderate tricuspid insufficiency 01/01/2017   OSA (obstructive sleep apnea) 01/01/2017   Chronic a-fib (HCC) 12/06/2015   Pure hypercholesterolemia 02/09/2015   Anticoagulant long-term use 04/29/2014   Hyperlipidemia 04/29/2014   Hypertension, essential, benign 04/26/2014    ONSET DATE: 04/05/23  REFERRING DIAG: aphasia  THERAPY DIAG:  Aphasia  Cognitive communication deficit  Rationale for Evaluation and Treatment Rehabilitation  SUBJECTIVE:   SUBJECTIVE STATEMENT: Pt pleasant and cooperative. Very motivated to work with SLP.  Pt accompanied by: self  PERTINENT HISTORY: Patient is a 67 y.o. male s/p L MCA distribution infarct and  R punctate temporal lobe infarct on 04/05/23. Pt with PMHx of ca of the transverse colon, multiple myeloma, lymphoma, DM, HTN, HLD, CKD, and recently diagnosed prostate cancer. Pt undergoing radiation tx for cancer and reports plan to possibly resume chemotherapy.   DIAGNOSTIC FINDINGS:  MRI brain 04/06/23, "MRI brain findings-Acute infarct in the left parietal lobe with small focus of infarct in the right temporal lobe (5:41, 5:38) evident on diffusion-weighted imaging and FLAIR  sequences. A few nodular foci of low signal on susceptibility weighted imaging in the region of left parietal infarct (for example 17:46) may represent small areas of microhemorrhage or cortical vessel thrombus.Mild cerebral volume loss with ex vacuo dilatation of the CSF-containing spaces. There are scattered in confluent foci of signal abnormality within the periventricular and deep white matter.  These are nonspecific but commonly seen with small vessel ischemic changes. There is no midline shift. No extra-axial fluid collection. No mass. There is no abnormal enhancement."  PAIN:  Are you having pain? No  FALLS: Has patient fallen in last 6 months?  No  LIVING ENVIRONMENT: Lives with: lives with their family since stroke; pt lived alone prior to stroke Lives in: House/apartment  PLOF:  Level of assistance: Independent with ADLs, Comment: prior to stroke; now reports needing assistance with iADLs  Employment: Retired   PATIENT GOALS    to be able to participate in complex conversation with family and friends; to understanding what he is reading; to improve QoL; to be more independent   OBJECTIVE:   TODAY'S TREATMENT:  Pt seen for skilled SLP services targeting improve functional and receptive communication. Endorsed improving confidence in his ability to communicate with family and friends; however, reports continued frustration with speech/language deficits. Supportive counseling provided. Pt stated that he has not had much time to complete assigned HEP on Constant Therapy App, but has been working on worksheet for HEP. Reviewed multimodal communication strategies including synonym/antonym use, semantic features analysis, circumlocution, and gesturing as a strategies to repair anomic event. Pt utilized during informal conversational exchanges with min cueing with inability to repair communication breakdowns x3 instances in ~15-20 minutes of conversation. During picture description task, pt  generated sentences for 8/10 stimulus items indep; 10/10 with mod/max hierarchical cueing. Pt provided alternate sentences for pictured stimuli for 3/5 items with mod/max hierarchical cueing. Discussed community access/advocacy for aphasia. Pt provided with aphasia wallet card for personal use.      PATIENT EDUCATION: Education details: HEP, progress to date, communication strategies Person educated: Patient Education method: Explanation Education comprehension: verbalized understanding; needs further reinforcement  HOME EXERCISE PROGRAM:   Constant Therapy set up on pt's telephone for HEP. Pt has worksheets from previous sessions to work on.    GOALS:  Goals reviewed with patient? Yes  SHORT TERM GOALS: Target date: 10 sessions  Patient will demonstrate improved expressive language skills during communication breakdowns by using multimodal means (semantic feature analysis, circumlocution, synonym/antonym use, gestures, writing, drawing) to repair with mod cues. Baseline: Goal status: INITIAL  2.  With Moderate A, patient will complete a semantic feature analysis with at least 3-5 relevant features for 8/10 target words with cues to improve word-finding skills.  Baseline:  Goal status: INITIAL  3.  With Moderate A, patient will generate sentences with 3 or more words during structured task at 70% accuracy in order to increase ability to communicate basic wants and needs.  Baseline:  Goal status: INITIAL  4.  Pt will participate in further assessment of functional reading  and writing to help determine level of assistance needed for language based iADL tasks. Baseline:  Goal status: INITIAL  5.  Patient will follow two step commands 70% acc in context of a functional activity.  Baseline:  Goal status: INITIAL  LONG TERM GOALS: Target date: 12 weeks  Patient will demonstrate knowledge of appropriate activities to support functional receptive and expressive language outside of  ST with assistance from family.  Baseline:  Goal status: INITIAL  2.  Patient will report improved confidence in conversations outside of home (appointments, friends, on the phone) than prior to ST, as measured by Communication Effectiveness Survey.  Baseline:  15/32 Goal status: INITIAL  3.  Patient and/or family will report use of strategies outside of ST to improve communication (use of scripts, pre-planning, semantic feature analysis, supported conversation).  Baseline:  Goal status: INITIAL  4.  Patient will demonstrate improved ability with describing events, storytelling, or sharing medical information through personalized strategies (requesting time, using SFA, drawing, etc) as measured by Communication Effectiveness Survey.  Baseline:  Goal status: INITIAL   ASSESSMENT:  CLINICAL IMPRESSION: Patient is a 67 y.o. 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 Pt was seen today for speech/language treatment and continues to present with non-fluent aphasia most c/w Broca's type and concomitant apraxia of speech  Pt making good progress toward goals and continues to be highly motivated to participate in tx. See above for details of today's tx sesssion. I recommend continued course of skilled SLP services targeting functional receptive and expressive communication to improve pt's participation in iADLs, preferred activities, and QoL.    OBJECTIVE IMPAIRMENTS include expressive language, receptive language, and aphasia. These impairments are limiting patient from managing medications, managing appointments, managing finances, household responsibilities, ADLs/IADLs, and effectively communicating at home and in community. Factors affecting potential to achieve goals and functional outcome are co-morbidities. Patient will benefit from skilled SLP services to address above impairments and improve overall function.  REHAB POTENTIAL:  Excellent  PLAN: SLP FREQUENCY: 2x/week  SLP DURATION: 12 weeks  PLANNED INTERVENTIONS: Cueing hierachy, Internal/external aids, Functional tasks, Multimodal communication approach, SLP instruction and feedback, Compensatory strategies, and Patient/family education    Clyde Canterbury, M.S., CCC-SLP Speech-Language Pathologist  [facility] Outpatient Rehabilitation at [facility] [address] [place], [place], [address] Phone: [phone]   Fax:  [phone]

## 2023-05-09 ENCOUNTER — Other Ambulatory Visit: Payer: Self-pay

## 2023-05-10 ENCOUNTER — Ambulatory Visit
Admission: RE | Admit: 2023-05-10 | Discharge: 2023-05-10 | Disposition: A | Discharge status: Home / Self Care | Payer: Medicare HMO | Source: Ambulatory Visit | Attending: Radiation Oncology | Admitting: Radiation Oncology

## 2023-05-10 ENCOUNTER — Ambulatory Visit
Admission: EM | Admit: 2023-05-10 | Discharge: 2023-05-10 | Disposition: A | Discharge status: Home / Self Care | Payer: Medicare HMO | Attending: Physician Assistant | Admitting: Physician Assistant

## 2023-05-10 ENCOUNTER — Other Ambulatory Visit: Payer: Self-pay

## 2023-05-10 ENCOUNTER — Encounter: Payer: Self-pay | Admitting: Oncology

## 2023-05-10 DIAGNOSIS — L03115 Cellulitis of right lower limb: Secondary | ICD-10-CM | POA: Diagnosis not present

## 2023-05-10 DIAGNOSIS — M109 Gout, unspecified: Secondary | ICD-10-CM | POA: Diagnosis not present

## 2023-05-10 DIAGNOSIS — C61 Malignant neoplasm of prostate: Secondary | ICD-10-CM | POA: Diagnosis not present

## 2023-05-10 LAB — RAD ONC ARIA SESSION SUMMARY
Course Elapsed Days: 18
Plan Fractions Treated to Date: 14
Plan Prescribed Dose Per Fraction: 2.5 Gy
Plan Total Fractions Prescribed: 28
Plan Total Prescribed Dose: 70 Gy
Reference Point Dosage Given to Date: 35 Gy
Reference Point Session Dosage Given: 2.5 Gy
Session Number: 14

## 2023-05-10 MED ORDER — CEFDINIR 300 MG PO CAPS: 300.0000 mg | ORAL_CAPSULE | Freq: Two times a day (BID) | ORAL | 0 refills | Status: AC

## 2023-05-10 MED ORDER — DEXAMETHASONE SODIUM PHOSPHATE 10 MG/ML IJ SOLN
10.0000 mg | Freq: Once | INTRAMUSCULAR | Status: AC
  Administered 2023-05-10: 10 mg via INTRAMUSCULAR

## 2023-05-10 NOTE — ED Triage Notes (Signed)
Patient presents to UC for gout flare-up in his right knee and left ankle. Pt is on 2 different medication for his gout. Pt is requesting an injection for his pain.

## 2023-05-10 NOTE — Discharge Instructions (Addendum)
-  Continue colchicine and continue allopurinol.  -You were given dexamethasone injection for gout flare up.  -Avoid triggers and follow up with PCP -Start antibiotic to cover for possible skin infection of knee. If increased redness, swelling or worsening pain--return or go to ER.

## 2023-05-10 NOTE — ED Provider Notes (Signed)
[facility]    CSN: [id number] Arrival date & time: 05/10/23  1242      History   Chief Complaint Chief Complaint  Patient presents with   Knee Pain   Ankle Pain    HPI Chris George is a 67 y.o. male with medical history significant for gout with recurrent flareups.  He is here today for right knee swelling/redness and left medial ankle swelling x 3-4 days. He fell and scraped knee when knee became swollen. Patient was last seen for acute gout flareup on 04/22/2023.  He reported a flareup of his left ankle and right elbow at that time.  He was given a dexamethasone injection and advised to take colchicine.  He says this normally helps his gout flareups and he states he has had 2 received dexamethasone injections every 6 weeks or so for flareups of gout in his left ankle or right elbow.  He has a history of chronic kidney disease and his last GFR was 40 noted in previous office visit from 03/30/2023.  He also recently had a CVA on 04/05/2023.  Since then he has been started on Eliquis and atorvastatin.  He is following up with specialists.  Additionally he has a history of cancer/multiple myeloma and just recently restarted chemotherapy and radiation.  Patient says his gout is very difficult to control.  He also has a history of diabetes and is not taking any medication. He does take allopurinol 200 mg daily for gout prophylaxis. This was recently increased about 4 days ago after seeing PCP. Awaiting referral to endocrinology for recurrent gout.    HPI  Past Medical History:  Diagnosis Date   A-fib (HCC)    Anemia    Arthritis    ankles   Cancer (HCC)    multiple myeloma   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HCC) 10/26/2019   Cardiomyopathy (HCC)    Chemotherapy-induced peripheral neuropathy (HCC)    CHF (congestive heart failure) (HCC)    Depression    Dysrhythmia    atrial fib   HLD (hyperlipidemia)    Hypercholesteremia    Hypertension    Moderate tricuspid insufficiency     Multiple myeloma (HCC)    not being treated right now per pt 11/11/19   Pneumonia    Sleep apnea    No CPAP.  OSA resolved with wt loss.    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   Diabetes mellitus type 2, controlled, without complications (HCC) 08/07/2021   Neuropathy 08/30/2020   Port-A-Cath in place    Abnormal ECG 11/04/2019   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HCC) 10/27/2019   Goals of care, counseling/discussion 05/18/2019   Multiple myeloma (HCC) 05/17/2019   Anemia 05/12/2019   Arthritis of left ankle 02/05/2018   Cardiomyopathy, idiopathic (HCC) 01/07/2018   Moderate tricuspid insufficiency 01/01/2017   OSA (obstructive sleep apnea) 01/01/2017   Chronic a-fib (HCC) 12/06/2015   Pure hypercholesterolemia 02/09/2015   Anticoagulant long-term use 04/29/2014   Hyperlipidemia 04/29/2014   Hypertension, essential, benign 04/26/2014    Past Surgical History:  Procedure Laterality Date   ATRIAL ABLATION SURGERY     CARDIAC SURGERY     A-Fib Ablations   COLONOSCOPY WITH PROPOFOL N/A 10/26/2019   Procedure: COLONOSCOPY WITH BIOPSY;  Surgeon: [name], MD;  Location: [facility];  Service: Endoscopy;  Laterality:  N/A;   COLONOSCOPY WITH PROPOFOL N/A 09/03/2022   Procedure: COLONOSCOPY WITH PROPOFOL;  Surgeon: [name], MD;  Location: [facility];  Service: Endoscopy;  Laterality: N/A;   ESOPHAGOGASTRODUODENOSCOPY (EGD) WITH PROPOFOL N/A 10/26/2019   Procedure: ESOPHAGOGASTRODUODENOSCOPY (EGD) WITH BIOPSY;  Surgeon: [name], MD;  Location: [facility];  Service: Endoscopy;  Laterality: N/A;  sleep apnea   LAPAROSCOPIC PARTIAL COLECTOMY Right 11/17/2019   Procedure: LAPAROSCOPIC PARTIAL COLECTOMY RIGHT EXTENDED;  Surgeon: [name], MD;  Location: [facility];  Service: General;   Laterality: Right;   PORTACATH PLACEMENT N/A 12/28/2019   Procedure: INSERTION PORT-A-CATH Left subclavian;  Surgeon: [name], MD;  Location: [facility];  Service: General;  Laterality: N/A;       Home Medications    Prior to Admission medications   Medication Sig Start Date End Date Taking? Authorizing Provider  allopurinol (ZYLOPRIM) 100 MG tablet Take 100 mg by mouth daily. 11/01/22 11/01/23 Yes [provider]  apixaban (ELIQUIS) 5 MG TABS tablet Take 5 mg by mouth 2 (two) times daily.   Yes [provider]  APPLE CIDER VINEGAR PO Take 30-45 mLs by mouth every other day.    Yes [provider]  atorvastatin (LIPITOR) 80 MG tablet Take 80 mg by mouth daily.   Yes [provider]  cefdinir (OMNICEF) 300 MG capsule Take 1 capsule (300 mg total) by mouth 2 (two) times daily for 7 days. 05/10/23 05/17/23 Yes Eusebio Friendly B, PA-C  colchicine 0.6 MG tablet Take 1 tablet (0.6 mg total) by mouth 2 (two) times daily. Until flare resolves. 04/22/23  Yes Shirlee Latch, PA-C  furosemide (LASIX) 80 MG tablet Take 80 mg by mouth daily. Morning & late afternoon   Yes [provider]  MAGNESIUM PO Take by mouth.   Yes [provider]  Multiple Vitamins-Minerals (ZINC PO) Take by mouth.   Yes [provider]  potassium bicarbonate (K-LYTE) 25 MEQ disintegrating tablet Take 25 mEq by mouth 4 (four) times a week. In the afternoon.   Yes [provider]  prochlorperazine (COMPAZINE) 10 MG tablet Take 1 tablet (10 mg total) by mouth every 6 (six) hours as needed for nausea or vomiting. 04/23/23  Yes [name], MD  Zinc Sulfate (ZINC 15 PO) Take by mouth.   Yes [provider]  digoxin (LANOXIN) 0.25 MG tablet Take 0.25 mg by mouth daily.  Patient not taking: Reported on 04/23/2023    [provider]  Misc Natural Products (TART CHERRY ADVANCED PO) Take 60 mLs by mouth every other day.    [provider]    Family History Family History  Problem Relation Age of Onset   Healthy Mother     Social History Social History   Tobacco Use   Smoking status: Never    Passive exposure: Never   Smokeless tobacco: Never  Vaping Use   Vaping Use: Never used  Substance Use Topics   Alcohol use: Not Currently   Drug use: Never     Allergies   Patient has no known allergies.   Review of Systems Review of Systems  Constitutional:  Negative for fatigue and fever.  Musculoskeletal:  Positive for arthralgias and joint swelling.  Skin:  Positive for color change and wound.  Neurological:  Negative for weakness and numbness.     Physical Exam Triage Vital Signs ED Triage Vitals  Enc Vitals Group     BP 04/22/23 1202 127/84  Pulse Rate 04/22/23 1202 87     Resp 04/22/23 1202 16     Temp 04/22/23 1202 97.8 F (36.6 C)     Temp Source 04/22/23 1202 Oral     SpO2 04/22/23 1202 97 %     Weight --      Height --      Head Circumference --      Peak Flow --      Pain Score 04/22/23 1200 7     Pain Loc --      Pain Edu? --      Excl. in GC? --    No data found.  Updated Vital Signs BP 109/76 (BP Location: Left Arm)   Pulse 63   Temp 98.7 F (37.1 C) (Oral)   Resp 18   SpO2 100%       Physical Exam Vitals and nursing note reviewed.  Constitutional:      General: He is not in acute distress.    Appearance: Normal appearance. He is well-developed. He is not ill-appearing.  HENT:     Head: Normocephalic and atraumatic.  Eyes:     General: No scleral icterus.    Conjunctiva/sclera: Conjunctivae normal.  Cardiovascular:     Rate and Rhythm: Normal rate and regular rhythm.     Pulses: Normal pulses.  Pulmonary:     Effort: Pulmonary effort is normal. No respiratory distress.     Breath sounds: Normal breath sounds.  Musculoskeletal:     Cervical back: Neck supple.     Comments: Right knee: Swelling, erythema, warmth and abrasion of anterior knee. Diffuse TTP.  Reduced ROM.  Left ankle:  Swelling, tenderness and slight erythema of the medial aspect of the ankle.  Reduced range of motion.  Good pulses.  Skin:    General: Skin is warm and dry.     Capillary Refill: Capillary refill takes less than 2 seconds.  Neurological:     General: No focal deficit present.     Mental Status: He is alert. Mental status is at baseline.     Gait: Gait normal.     Comments: Patient's speech is slowed at times and he sometimes forgets words.  States this is secondary to the recent CVA a little more than a month ago.  Psychiatric:        Mood and Affect: Mood normal.        Speech: Speech is delayed.        Behavior: Behavior normal.      UC Treatments / Results  Labs (all labs ordered are listed, but only abnormal results are displayed) Labs Reviewed - No data to display  EKG   Radiology No results found.  Procedures Procedures (including critical care time)  Medications Ordered in UC Medications  dexamethasone (DECADRON) injection 10 mg (10 mg Intramuscular Given 05/10/23 1413)    Initial Impression / Assessment and Plan / UC Course  I have reviewed the triage vital signs and the nursing notes.  Pertinent labs & imaging results that were available during my care of the patient were reviewed by me and considered in my medical decision making (see chart for details).   67 year old male with history of recurrent gout flareups, chronic kidney disease, atrial fibrillation and recent CVA on 04/05/2023 presents for 3-day history of pain, swelling, redness of the right knee and left ankle.  States that he normally receives 10 mg IM dexamethasone for acute flareups.  He does not prefer to take oral  prednisone as that will increase his blood sugar.  He also reports he is typically started on colchicine. He has been taking colchicine and allopurinol (recently increased from 100-200 mg).  States this typically works for him. He would like to be treated again with  dexamethasone.  Patient given 10 mg IM dexamethasone in clinic.  Advised to continue allopurinol and colchicine. Will start cefdinir for possible cellulitis around the abrasion of knee.  Reviewed RICE guidelines.  Reviewed return to ER precautions.   Final Clinical Impressions(s) / UC Diagnoses   Final diagnoses:  Acute gout of right knee, unspecified cause  Acute gout of left ankle, unspecified cause  Cellulitis of knee, right     Discharge Instructions      -Continue colchicine and continue allopurinol.  -You were given dexamethasone injection for gout flare up.  -Avoid triggers and follow up with PCP -Start antibiotic to cover for possible skin infection of knee. If increased redness, swelling or worsening pain--return or go to ER.         ED Prescriptions     Medication Sig Dispense Auth. Provider   cefdinir (OMNICEF) 300 MG capsule Take 1 capsule (300 mg total) by mouth 2 (two) times daily for 7 days. 14 capsule Shirlee Latch, PA-C      PDMP not reviewed this encounter.      Shirlee Latch, PA-C 05/10/23 1425

## 2023-05-11 ENCOUNTER — Encounter: Payer: Self-pay | Admitting: Oncology

## 2023-05-13 ENCOUNTER — Ambulatory Visit
Admission: RE | Admit: 2023-05-13 | Discharge: 2023-05-13 | Disposition: A | Discharge status: Home / Self Care | Payer: Medicare HMO | Source: Ambulatory Visit | Attending: Radiation Oncology | Admitting: Radiation Oncology

## 2023-05-13 ENCOUNTER — Ambulatory Visit: Payer: Medicare HMO

## 2023-05-13 ENCOUNTER — Other Ambulatory Visit: Payer: Self-pay

## 2023-05-13 DIAGNOSIS — C61 Malignant neoplasm of prostate: Secondary | ICD-10-CM | POA: Diagnosis not present

## 2023-05-13 LAB — RAD ONC ARIA SESSION SUMMARY
Course Elapsed Days: 21
Plan Fractions Treated to Date: 15
Plan Prescribed Dose Per Fraction: 2.5 Gy
Plan Total Fractions Prescribed: 28
Plan Total Prescribed Dose: 70 Gy
Reference Point Dosage Given to Date: 37.5 Gy
Reference Point Session Dosage Given: 2.5 Gy
Session Number: 15

## 2023-05-14 ENCOUNTER — Ambulatory Visit: Payer: Medicare HMO | Admitting: Speech Pathology

## 2023-05-14 ENCOUNTER — Ambulatory Visit
Admission: RE | Admit: 2023-05-14 | Discharge: 2023-05-14 | Disposition: A | Discharge status: Home / Self Care | Payer: Medicare HMO | Source: Ambulatory Visit | Attending: Radiation Oncology | Admitting: Radiation Oncology

## 2023-05-14 ENCOUNTER — Other Ambulatory Visit: Payer: Self-pay

## 2023-05-14 ENCOUNTER — Ambulatory Visit: Payer: Medicare HMO

## 2023-05-14 DIAGNOSIS — R4701 Aphasia: Secondary | ICD-10-CM

## 2023-05-14 DIAGNOSIS — C61 Malignant neoplasm of prostate: Secondary | ICD-10-CM | POA: Diagnosis not present

## 2023-05-14 DIAGNOSIS — R41841 Cognitive communication deficit: Secondary | ICD-10-CM

## 2023-05-14 LAB — RAD ONC ARIA SESSION SUMMARY
Course Elapsed Days: 22
Plan Fractions Treated to Date: 16
Plan Prescribed Dose Per Fraction: 2.5 Gy
Plan Total Fractions Prescribed: 28
Plan Total Prescribed Dose: 70 Gy
Reference Point Dosage Given to Date: 40 Gy
Reference Point Session Dosage Given: 2.5 Gy
Session Number: 16

## 2023-05-14 MED FILL — Dexamethasone Sodium Phosphate Inj 100 MG/10ML: INTRAMUSCULAR | Qty: 1 | Status: AC

## 2023-05-14 NOTE — Therapy (Addendum)
OUTPATIENT SPEECH LANGUAGE PATHOLOGY APHASIA TREATMENT   Patient Name: Chris George MRN: [id number] DOB:02/29/1956, 67 y.o., male Today's Date: 05/14/2023  PCP: Vallarie Mare REFERRING PROVIDER: Maryjane Hurter, MD   End of Session - 05/14/23 1349     Visit Number 6    Number of Visits 24    Date for SLP Re-Evaluation 07/11/23    SLP Start Time 1035    SLP Stop Time  1120    SLP Time Calculation (min) 45 min    Activity Tolerance Patient tolerated treatment well             Past Medical History:  Diagnosis Date   A-fib (HCC)    Anemia    Arthritis    ankles   Cancer (HCC)    multiple myeloma   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HCC) 10/26/2019   Cardiomyopathy (HCC)    Chemotherapy-induced peripheral neuropathy (HCC)    CHF (congestive heart failure) (HCC)    Depression    Dysrhythmia    atrial fib   HLD (hyperlipidemia)    Hypercholesteremia    Hypertension    Moderate tricuspid insufficiency    Multiple myeloma (HCC)    not being treated right now per pt 11/11/19   Pneumonia    Sleep apnea    No CPAP.  OSA resolved with wt loss.   Past Surgical History:  Procedure Laterality Date   ATRIAL ABLATION SURGERY     CARDIAC SURGERY     A-Fib Ablations   COLONOSCOPY WITH PROPOFOL N/A 10/26/2019   Procedure: COLONOSCOPY WITH BIOPSY;  Surgeon: [name], MD;  Location: [facility];  Service: Endoscopy;  Laterality: N/A;   COLONOSCOPY WITH PROPOFOL N/A 09/03/2022   Procedure: COLONOSCOPY WITH PROPOFOL;  Surgeon: [name], MD;  Location: [facility];  Service: Endoscopy;  Laterality: N/A;   ESOPHAGOGASTRODUODENOSCOPY (EGD) WITH PROPOFOL N/A 10/26/2019   Procedure: ESOPHAGOGASTRODUODENOSCOPY (EGD) WITH BIOPSY;  Surgeon: [name], MD;  Location: [facility];  Service: Endoscopy;  Laterality: N/A;  sleep apnea   LAPAROSCOPIC PARTIAL COLECTOMY Right 11/17/2019   Procedure: LAPAROSCOPIC PARTIAL COLECTOMY RIGHT EXTENDED;  Surgeon: [name], MD;   Location: [facility];  Service: General;  Laterality: Right;   PORTACATH PLACEMENT N/A 12/28/2019   Procedure: INSERTION PORT-A-CATH Left subclavian;  Surgeon: [name], MD;  Location: [facility];  Service: General;  Laterality: N/A;   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   Diabetes mellitus type 2, controlled, without complications (HCC) 08/07/2021   Neuropathy 08/30/2020   Port-A-Cath in place    Abnormal ECG 11/04/2019   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HCC) 10/27/2019   Goals of care, counseling/discussion 05/18/2019   Multiple myeloma (HCC) 05/17/2019   Anemia 05/12/2019   Arthritis of left ankle 02/05/2018   Cardiomyopathy, idiopathic (HCC) 01/07/2018   Moderate tricuspid insufficiency 01/01/2017   OSA (obstructive sleep apnea) 01/01/2017   Chronic a-fib (HCC) 12/06/2015   Pure hypercholesterolemia 02/09/2015   Anticoagulant long-term use 04/29/2014   Hyperlipidemia 04/29/2014   Hypertension, essential, benign 04/26/2014    ONSET DATE: 04/05/23  REFERRING DIAG: aphasia  THERAPY DIAG:  Cognitive communication deficit  Aphasia  Rationale for Evaluation and Treatment Rehabilitation  SUBJECTIVE:   SUBJECTIVE STATEMENT: Pt pleasant and cooperative. Very motivated to work with SLP.  Endorsed improvement in verbal expression; however, communication difficulty still apparent. Also, noted difficulty  reading the Bible and texting.  Pt accompanied by: self  PERTINENT HISTORY: Patient is a 67 y.o. 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 Pt with PMHx of ca of the transverse colon, multiple myeloma, lymphoma, DM, HTN, HLD, CKD, and recently diagnosed prostate cancer. Pt undergoing radiation tx for cancer and reports plan to possibly resume chemotherapy.   DIAGNOSTIC  FINDINGS:  MRI brain 04/06/23, "MRI brain findings-Acute infarct in the left parietal lobe with small focus of infarct in the right temporal lobe (5:41, 5:38) evident on diffusion-weighted imaging and FLAIR sequences. A few nodular foci of low signal on susceptibility weighted imaging in the region of left parietal infarct (for example 17:46) may represent small areas of microhemorrhage or cortical vessel thrombus.Mild cerebral volume loss with ex vacuo dilatation of the CSF-containing spaces. There are scattered in confluent foci of signal abnormality within the periventricular and deep white matter.  These are nonspecific but commonly seen with small vessel ischemic changes. There is no midline shift. No extra-axial fluid collection. No mass. There is no abnormal enhancement."  PAIN:  Are you having pain? No  FALLS: Has patient fallen in last 6 months?  No  LIVING ENVIRONMENT: Lives with: lives with their family since stroke; pt lived alone prior to stroke Lives in: House/apartment  PLOF:  Level of assistance: Independent with ADLs, Comment: prior to stroke; now reports needing assistance with iADLs  Employment: Retired   PATIENT GOALS    to be able to participate in complex conversation with family and friends; to understanding what he is reading; to improve QoL; to be more independent   OBJECTIVE:  TODAY'S TREATMENT:  Skilled SLP treatment provided targeting assessment of reading comprehension as well as functional expressive communication.  Initiated Reading Comprehension Battery for Aphasia with scores as follows:  I. Word-Visual: 10/10 II. Word-Auditory: 10/10 III. Word-Semantic: 10/10 IV. Functional Reading: 9/10 V. Synonyms: 8/10 VI: Sentence-Picture: 10/10 VII: Paragraph-Picture: 9/10 *will plan to complete remaining subtest at subsequent session  Introduced strategies to improve reading comprehension including "chunking" and reading slowly as well as consideration for use  of audiobooks while reading text. Reviewed communication strategies for improved auditory comprehension and verbal expression. Pt able to use strategies on multiple occasions during session with mod assistance.   PATIENT EDUCATION: Education details: communication strategies, reading strategies Person educated: Patient Education method: Explanation Education comprehension: verbalized understanding; needs further reinforcement  HOME EXERCISE PROGRAM:   Worksheets provided for home practice    GOALS:  Goals reviewed with patient? Yes  SHORT TERM GOALS: Target date: 10 sessions  Patient will demonstrate improved expressive language skills during communication breakdowns by using multimodal means (semantic feature analysis, circumlocution, synonym/antonym use, gestures, writing, drawing) to repair with mod cues. Baseline: Goal status: INITIAL  2.  With Moderate A, patient will complete a semantic feature analysis with at least 3-5 relevant features for 8/10 target words with cues to improve word-finding skills.  Baseline:  Goal status: INITIAL  3.  With Moderate A, patient will generate sentences with 3 or more words during structured task at 70% accuracy in order to increase ability to communicate basic wants and needs.  Baseline:  Goal status: INITIAL  4.  Pt will participate in further assessment of functional reading and writing to help determine level of assistance needed for language based iADL tasks. Baseline:  Goal status: INITIAL  5.  Patient will follow two step commands 70% acc in context of a functional activity.  Baseline:  Goal status: INITIAL  LONG TERM GOALS: Target date: 12 weeks  Patient will demonstrate knowledge of appropriate activities to support functional receptive and expressive language outside of ST with assistance from family.  Baseline:  Goal status: INITIAL  2.  Patient will report improved confidence in conversations outside of home  (appointments, friends, on the phone) than prior to ST, as measured by Communication Effectiveness Survey.  Baseline:  15/32 Goal status: INITIAL  3.  Patient and/or family will report use of strategies outside of ST to improve communication (use of scripts, pre-planning, semantic feature analysis, supported conversation).  Baseline:  Goal status: INITIAL  4.  Patient will demonstrate improved ability with describing events, storytelling, or sharing medical information through personalized strategies (requesting time, using SFA, drawing, etc) as measured by Communication Effectiveness Survey.  Baseline:  Goal status: INITIAL   ASSESSMENT:  CLINICAL IMPRESSION: Patient is a 67 y.o. 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 Pt seen for skilled SLP services targeting functional receptive/expressive communication in setting of aphasia. 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 Pt's continues with non-fluent and halting speech with revisions and articulatory groping. Subjective improvement in verbal expression endorsed by pt this date. See treatment details as above. SLP to continue to f/u per established POC.  OBJECTIVE IMPAIRMENTS include expressive language, receptive language, and aphasia. These impairments are limiting patient from managing medications, managing appointments, managing finances, household responsibilities, ADLs/IADLs, and effectively communicating at home and in community. Factors affecting potential to achieve goals and functional outcome are co-morbidities. Patient will benefit from skilled SLP services to address above impairments and improve overall function.  REHAB POTENTIAL: Excellent  PLAN: SLP FREQUENCY: 2x/week  SLP DURATION: 12 weeks  PLANNED INTERVENTIONS: Cueing hierachy, Internal/external aids, Functional tasks, Multimodal communication approach, SLP instruction and  feedback, Compensatory strategies, and Patient/family education    Clyde Canterbury, M.S., CCC-SLP Speech-Language Pathologist  [facility] [address] [place], [place], [address] Phone: [phone]   Fax:  [phone]

## 2023-05-15 ENCOUNTER — Inpatient Hospital Stay: Payer: Medicare HMO

## 2023-05-15 ENCOUNTER — Other Ambulatory Visit: Payer: Self-pay

## 2023-05-15 ENCOUNTER — Inpatient Hospital Stay (HOSPITAL_BASED_OUTPATIENT_CLINIC_OR_DEPARTMENT_OTHER): Payer: Medicare HMO | Admitting: Oncology

## 2023-05-15 ENCOUNTER — Ambulatory Visit: Payer: Medicare HMO

## 2023-05-15 ENCOUNTER — Ambulatory Visit
Admission: RE | Admit: 2023-05-15 | Discharge: 2023-05-15 | Disposition: A | Discharge status: Home / Self Care | Payer: Medicare HMO | Source: Ambulatory Visit | Attending: Radiation Oncology | Admitting: Radiation Oncology

## 2023-05-15 ENCOUNTER — Other Ambulatory Visit: Payer: Self-pay | Admitting: Oncology

## 2023-05-15 DIAGNOSIS — C184 Malignant neoplasm of transverse colon: Secondary | ICD-10-CM

## 2023-05-15 DIAGNOSIS — C61 Malignant neoplasm of prostate: Secondary | ICD-10-CM | POA: Diagnosis not present

## 2023-05-15 DIAGNOSIS — Z5111 Encounter for antineoplastic chemotherapy: Secondary | ICD-10-CM | POA: Diagnosis not present

## 2023-05-15 LAB — CMP (CANCER CENTER ONLY)
ALT: 35 U/L (ref 0–44)
AST: 37 U/L (ref 15–41)
Albumin: 3.6 g/dL (ref 3.5–5.0)
Alkaline Phosphatase: 185 U/L — ABNORMAL HIGH (ref 38–126)
Anion gap: 8 (ref 5–15)
BUN: 32 mg/dL — ABNORMAL HIGH (ref 8–23)
CO2: 28 mmol/L (ref 22–32)
Calcium: 8.7 mg/dL — ABNORMAL LOW (ref 8.9–10.3)
Chloride: 102 mmol/L (ref 98–111)
Creatinine: 1.77 mg/dL — ABNORMAL HIGH (ref 0.61–1.24)
GFR, Estimated: 42 mL/min — ABNORMAL LOW (ref >60)
Glucose, Bld: 139 mg/dL — ABNORMAL HIGH (ref 70–99)
Potassium: 4.3 mmol/L (ref 3.5–5.1)
Sodium: 138 mmol/L (ref 135–145)
Total Bilirubin: 0.8 mg/dL (ref 0.3–1.2)
Total Protein: 7.7 g/dL (ref 6.5–8.1)

## 2023-05-15 LAB — RAD ONC ARIA SESSION SUMMARY
Course Elapsed Days: 23
Plan Fractions Treated to Date: 17
Plan Prescribed Dose Per Fraction: 2.5 Gy
Plan Total Fractions Prescribed: 28
Plan Total Prescribed Dose: 70 Gy
Reference Point Dosage Given to Date: 42.5 Gy
Reference Point Session Dosage Given: 2.5 Gy
Session Number: 17

## 2023-05-15 LAB — CBC WITH DIFFERENTIAL (CANCER CENTER ONLY)
Abs Immature Granulocytes: 0.01 10*3/uL (ref 0.00–0.07)
Basophils Absolute: 0 10*3/uL (ref 0.0–0.1)
Basophils Relative: 0 %
Eosinophils Absolute: 0.1 10*3/uL (ref 0.0–0.5)
Eosinophils Relative: 2 %
HCT: 28.7 % — ABNORMAL LOW (ref 39.0–52.0)
Hemoglobin: 9 g/dL — ABNORMAL LOW (ref 13.0–17.0)
Immature Granulocytes: 0 %
Lymphocytes Relative: 13 %
Lymphs Abs: 0.4 10*3/uL — ABNORMAL LOW (ref 0.7–4.0)
MCH: 27.6 pg (ref 26.0–34.0)
MCHC: 31.4 g/dL (ref 30.0–36.0)
MCV: 88 fL (ref 80.0–100.0)
Monocytes Absolute: 0.4 10*3/uL (ref 0.1–1.0)
Monocytes Relative: 15 %
Neutro Abs: 2 10*3/uL (ref 1.7–7.7)
Neutrophils Relative %: 70 %
Platelet Count: 95 10*3/uL — ABNORMAL LOW (ref 150–400)
RBC: 3.26 MIL/uL — ABNORMAL LOW (ref 4.22–5.81)
RDW: 14.8 % (ref 11.5–15.5)
WBC Count: 2.9 10*3/uL — ABNORMAL LOW (ref 4.0–10.5)
nRBC: 0 % (ref 0.0–0.2)

## 2023-05-15 LAB — MAGNESIUM: Magnesium: 1.7 mg/dL (ref 1.7–2.4)

## 2023-05-15 MED ORDER — SODIUM CHLORIDE 0.9 % IV SOLN
10.0000 mg | Freq: Once | INTRAVENOUS | Status: AC
  Administered 2023-05-15: 10 mg via INTRAVENOUS
  Filled 2023-05-15: qty 10

## 2023-05-15 MED ORDER — SODIUM CHLORIDE 0.9 % IV SOLN
Freq: Once | INTRAVENOUS | Status: AC
  Filled 2023-05-15: qty 250

## 2023-05-15 MED ORDER — FLUOROURACIL CHEMO INJECTION 2.5 GM/50ML
360.0000 mg/m2 | Freq: Once | INTRAVENOUS | Status: AC
  Administered 2023-05-15: 800 mg via INTRAVENOUS
  Filled 2023-05-15: qty 16

## 2023-05-15 MED ORDER — PALONOSETRON HCL INJECTION 0.25 MG/5ML
0.2500 mg | Freq: Once | INTRAVENOUS | Status: AC
  Administered 2023-05-15: 0.25 mg via INTRAVENOUS
  Filled 2023-05-15: qty 5

## 2023-05-15 MED ORDER — SODIUM CHLORIDE 0.9 % IV SOLN
360.0000 mg/m2 | Freq: Once | INTRAVENOUS | Status: AC
  Administered 2023-05-15: 782 mg via INTRAVENOUS
  Filled 2023-05-15: qty 39.1

## 2023-05-15 MED ORDER — SODIUM CHLORIDE 0.9 % IV SOLN
360.0000 mg/m2 | Freq: Once | INTRAVENOUS | Status: DC
  Filled 2023-05-15: qty 39.1

## 2023-05-15 MED ORDER — ATROPINE SULFATE 1 MG/ML IV SOLN
0.5000 mg | Freq: Once | INTRAVENOUS | Status: AC | PRN
  Administered 2023-05-15: 0.5 mg via INTRAVENOUS
  Filled 2023-05-15: qty 1

## 2023-05-15 MED ORDER — SODIUM CHLORIDE 0.9 % IV SOLN
2160.0000 mg/m2 | INTRAVENOUS | Status: DC
  Administered 2023-05-15: 5000 mg via INTRAVENOUS
  Filled 2023-05-15: qty 100

## 2023-05-15 MED ORDER — SODIUM CHLORIDE 0.9 % IV SOLN
160.0000 mg/m2 | Freq: Once | INTRAVENOUS | Status: AC
  Administered 2023-05-15: 340 mg via INTRAVENOUS
  Filled 2023-05-15: qty 15

## 2023-05-15 NOTE — Progress Notes (Signed)
[facility]  Telephone:([phone]) [phone] Fax:(336) [phone]  ID: Chris George OB: 04/09/1956  MR#: [id number]  AOZ#:[id number]  Patient Care Team: Marina Goodell, MD as PCP - General (Family Medicine) [name], MD as Consulting Physician (Oncology) Morene Crocker, MD as Referring Physician (Neurology) Lamar Blinks, MD as Consulting Physician (Cardiology)  CHIEF COMPLAINT: Recurrent colon cancer, smoldering myeloma, prostate cancer.  INTERVAL HISTORY: Patient returns to clinic today for further evaluation and initiation of cycle 1 of FOLFIRI.  He continues to have an expressive aphasia, but otherwise feels well.  He admits to intermittent diarrhea secondary to the radiation treatments for his prostate cancer.  He has a chronic peripheral neuropathy, but no other neurologic complaints. He denies any recent fevers or illnesses.  He has a good appetite and denies weight loss.  He has no chest pain, shortness of breath, cough, or hemoptysis.  He denies any nausea, vomiting, or constipation.  He has no melena or hematochezia.  He has no urinary complaints.  Patient no further specific complaints today.  REVIEW OF SYSTEMS:   Review of Systems  Constitutional: Negative.  Negative for fever, malaise/fatigue and weight loss.  Respiratory: Negative.  Negative for cough, hemoptysis and shortness of breath.   Cardiovascular: Negative.  Negative for chest pain and leg swelling.  Gastrointestinal:  Positive for diarrhea. Negative for abdominal pain, blood in stool and melena.  Genitourinary: Negative.  Negative for hematuria.  Musculoskeletal: Negative.  Negative for back pain and joint pain.  Skin: Negative.  Negative for rash.  Neurological:  Positive for tingling and sensory change. Negative for dizziness, focal weakness, weakness and headaches.  Psychiatric/Behavioral: Negative.  Negative for depression. The patient is not nervous/anxious.     As per HPI.  Otherwise, a complete review of systems is negative.  PAST MEDICAL HISTORY: Past Medical History:  Diagnosis Date   A-fib (HCC)    Anemia    Arthritis    ankles   Cancer (HCC)    multiple myeloma   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HCC) 10/26/2019   Cardiomyopathy (HCC)    Chemotherapy-induced peripheral neuropathy (HCC)    CHF (congestive heart failure) (HCC)    Depression    Dysrhythmia    atrial fib   HLD (hyperlipidemia)    Hypercholesteremia    Hypertension    Moderate tricuspid insufficiency    Multiple myeloma (HCC)    not being treated right now per pt 11/11/19   Pneumonia    Sleep apnea    No CPAP.  OSA resolved with wt loss.    PAST SURGICAL HISTORY: Past Surgical History:  Procedure Laterality Date   ATRIAL ABLATION SURGERY     CARDIAC SURGERY     A-Fib Ablations   COLONOSCOPY WITH PROPOFOL N/A 10/26/2019   Procedure: COLONOSCOPY WITH BIOPSY;  Surgeon: [name], MD;  Location: [facility];  Service: Endoscopy;  Laterality: N/A;   COLONOSCOPY WITH PROPOFOL N/A 09/03/2022   Procedure: COLONOSCOPY WITH PROPOFOL;  Surgeon: [name], MD;  Location: [facility];  Service: Endoscopy;  Laterality: N/A;   ESOPHAGOGASTRODUODENOSCOPY (EGD) WITH PROPOFOL N/A 10/26/2019   Procedure: ESOPHAGOGASTRODUODENOSCOPY (EGD) WITH BIOPSY;  Surgeon: [name], MD;  Location: [facility];  Service: Endoscopy;  Laterality: N/A;  sleep apnea   LAPAROSCOPIC PARTIAL COLECTOMY Right 11/17/2019   Procedure: LAPAROSCOPIC PARTIAL COLECTOMY RIGHT EXTENDED;  Surgeon: [name], MD;  Location: [facility];  Service: General;  Laterality: Right;   PORTACATH PLACEMENT N/A 12/28/2019  Procedure: INSERTION PORT-A-CATH Left subclavian;  Surgeon: [name], MD;  Location: [facility];  Service: General;  Laterality: N/A;    FAMILY HISTORY: Family History  Problem Relation Age of Onset   Healthy Mother     ADVANCED DIRECTIVES (Y/N):  N  HEALTH MAINTENANCE: Social  History   Tobacco Use   Smoking status: Never    Passive exposure: Never   Smokeless tobacco: Never  Vaping Use   Vaping Use: Never used  Substance Use Topics   Alcohol use: Not Currently   Drug use: Never     Colonoscopy:  PAP:  Bone density:  Lipid panel:  No Known Allergies  Current Outpatient Medications  Medication Sig Dispense Refill   allopurinol (ZYLOPRIM) 100 MG tablet Take 100 mg by mouth daily.     apixaban (ELIQUIS) 5 MG TABS tablet Take 5 mg by mouth 2 (two) times daily.     APPLE CIDER VINEGAR PO Take 30-45 mLs by mouth every other day.      atorvastatin (LIPITOR) 80 MG tablet Take 80 mg by mouth daily.     cefdinir (OMNICEF) 300 MG capsule Take 1 capsule (300 mg total) by mouth 2 (two) times daily for 7 days. 14 capsule 0   colchicine 0.6 MG tablet Take 1 tablet (0.6 mg total) by mouth 2 (two) times daily. Until flare resolves. 20 tablet 1   furosemide (LASIX) 80 MG tablet Take 80 mg by mouth daily. Morning & late afternoon     MAGNESIUM PO Take by mouth.     Misc Natural Products (TART CHERRY ADVANCED PO) Take 60 mLs by mouth every other day.     Multiple Vitamins-Minerals (ZINC PO) Take by mouth.     potassium bicarbonate (K-LYTE) 25 MEQ disintegrating tablet Take 25 mEq by mouth 4 (four) times a week. In the afternoon.     prochlorperazine (COMPAZINE) 10 MG tablet Take 1 tablet (10 mg total) by mouth every 6 (six) hours as needed for nausea or vomiting. 60 tablet 1   Zinc Sulfate (ZINC 15 PO) Take by mouth.     digoxin (LANOXIN) 0.25 MG tablet Take 0.25 mg by mouth daily.  (Patient not taking: Reported on 04/23/2023)     No current facility-administered medications for this visit.   Facility-Administered Medications Ordered in Other Visits  Medication Dose Route Frequency Provider Last Rate Last Admin   fluorouracil (ADRUCIL) 5,000 mg in sodium chloride 0.9 % 150 mL chemo infusion  2,160 mg/m2 (Treatment Plan Recorded) Intravenous 1 day or 1 dose Orlie Dakin,  Tollie Pizza, MD       fluorouracil (ADRUCIL) chemo injection 800 mg  360 mg/m2 (Treatment Plan Recorded) Intravenous Once [name], MD       irinotecan (CAMPTOSAR) 340 mg in sodium chloride 0.9 % 500 mL chemo infusion  160 mg/m2 (Treatment Plan Recorded) Intravenous Once [name], MD 345 mL/hr at 05/15/23 1019 340 mg at 05/15/23 1019   leucovorin 782 mg in sodium chloride 0.9 % 250 mL infusion  360 mg/m2 (Treatment Plan Recorded) Intravenous Once [name], MD 193 mL/hr at 05/15/23 1021 782 mg at 05/15/23 1021    OBJECTIVE: Vitals:   05/15/23 0841  BP: 92/66  Pulse: (!) 48  Temp: (!) 97 F (36.1 C)  SpO2: 99%      Body mass index is 32 kg/m.    ECOG FS:0 - Asymptomatic  General: Well-developed, well-nourished, no acute distress. Eyes: Pink conjunctiva, anicteric sclera. HEENT: Normocephalic, moist mucous  membranes. Lungs: No audible wheezing or coughing. Heart: Regular rate and rhythm. Abdomen: Soft, nontender, no obvious distention. Musculoskeletal: No edema, cyanosis, or clubbing. Neuro: Alert, answering all questions appropriately. Cranial nerves grossly intact. Skin: No rashes or petechiae noted. Psych: Normal affect.  LAB RESULTS:  Lab Results  Component Value Date   NA 138 05/15/2023   K 4.3 05/15/2023   CL 102 05/15/2023   CO2 28 05/15/2023   GLUCOSE 139 (H) 05/15/2023   BUN 32 (H) 05/15/2023   CREATININE 1.77 (H) 05/15/2023   CALCIUM 8.7 (L) 05/15/2023   PROT 7.7 05/15/2023   ALBUMIN 3.6 05/15/2023   AST 37 05/15/2023   ALT 35 05/15/2023   ALKPHOS 185 (H) 05/15/2023   BILITOT 0.8 05/15/2023   GFRNONAA 42 (L) 05/15/2023   GFRAA >60 07/29/2020    Lab Results  Component Value Date   WBC 2.9 (L) 05/15/2023   NEUTROABS 2.0 05/15/2023   HGB 9.0 (L) 05/15/2023   HCT 28.7 (L) 05/15/2023   MCV 88.0 05/15/2023   PLT 95 (L) 05/15/2023   Lab Results  Component Value Date   IRON 41 (L) 05/11/2020   TIBC 323 05/11/2020    IRONPCTSAT 13 (L) 05/11/2020   Lab Results  Component Value Date   FERRITIN 86 05/11/2020     STUDIES: No results found.   ASSESSMENT: Stage IIIc colon cancer, smoldering myeloma, prostate cancer.  PLAN:    Stage IIIc colon cancer: Patient completed cycle 9 of adjuvant FOLFOX on June 03, 2020.  Given his difficulties with treatment and declining performance status, treatment was discontinued altogether. Colonoscopy on September 03, 2022 did not reveal any significant pathology and recommendation was to repeat in 3 years.  CT scan from December 07, 2022 reviewed independently with enlarging aortocaval node now measuring 1.9 x 1.4 cm.  PET scan results from Mar 15, 2023 reviewed independently and reported as above with progressive retroperitoneal lymphadenopathy presumably related to patient's colon cancer.  His CEA also continues to trend up and is now 194.  Proceed with cycle 1 of FOLFIRI today.  Treatment has been dose reduced 10% given his renal insufficiency and pancytopenia.  Will hold Avastin at this time given his recent stroke.  Return to clinic in 2 days for pump removal in 2 weeks for further evaluation and consideration of cycle 2.   Prostate cancer: Gleason's 3+4.  Patient's PSA is increased, but relatively stable at 32.4.  Imaging as above.  Nuclear medicine bone scan on December 13, 2022 was reported as negative.  PSMA PET per radiation oncology from Mar 21, 2023 did not reveal metastatic disease.  Patient is now undergoing XRT for local control disease and will complete treatment on May 30, 2023.  Smoldering myeloma: Chronic and unchanged.  Patient was initially diagnosed and treated in [place], [place] and treated with single agent Velcade in approximately 2013.  He declined maintenance treatment or referral for bone marrow transplant.  His M spike has ranged from 1.1-2.0 since July 2020.  His most recent result was 1.6.  Over the same timeframe his IgG component has ranged from  [phone].  His most recent result is 2344.  Finally, his kappa free light chains have ranged from 24.8 -75.3 with his most recent result reported at 36.2.  Nuclear med bone scan as above.  His most recent bone marrow biopsy completed on May 28, 2019 revealed only 10 to 15% plasma cells with no clonality reported.  Cytogenetics were also reported as normal.  Continue to monitor closely.  Anemia: Patient's hemoglobin has trended down to 9.0, monitor.  He last received IV Feraheme on October 06, 2019.   Leukopenia: Mild, monitor.  Dose reduced FOLFIRI as above. Thrombocytopenia: Patient's platelet count is trended down to 95.  Dose reduced FOLFIRI as above. Renal insufficiency: Chronic and unchanged.  Patient's most recent creatinine is 1.77.  Continue follow-up with nephrology as indicated.   Peripheral neuropathy: Chronic and unchanged.  Patient reports gabapentin did not help.  He was recently initiated on Lyrica 50 mg daily and given a referral to neurology.     Patient expressed understanding and was in agreement with this plan. He also understands that He can call clinic at any time with any questions, concerns, or complaints.    [name], MD   05/15/2023 11:46 AM

## 2023-05-15 NOTE — Patient Instructions (Signed)
[facility] [place] [facility] [facility]  Discharge Instructions: Thank you for choosing [facility] to provide your oncology and hematology care.  If you have a lab appointment with the Cancer Center, please go directly to the Cancer Center and check in at the registration area.  Wear comfortable clothing and clothing appropriate for easy access to any Portacath or PICC line.   We strive to give you quality time with your provider. You may need to reschedule your appointment if you arrive late (15 or more minutes).  Arriving late affects you and other patients whose appointments are after yours.  Also, if you miss three or more appointments without notifying the office, you may be dismissed from the clinic at the provider's discretion.      For prescription refill requests, have your pharmacy contact our office and allow 72 hours for refills to be completed.    Today you received the following chemotherapy and/or immunotherapy agents Leucovorin, Irinotecan and Adrucil.      To help prevent nausea and vomiting after your treatment, we encourage you to take your nausea medication as directed.  BELOW ARE SYMPTOMS THAT SHOULD BE REPORTED IMMEDIATELY: *FEVER GREATER THAN 100.4 F (38 C) OR HIGHER *CHILLS OR SWEATING *NAUSEA AND VOMITING THAT IS NOT CONTROLLED WITH YOUR NAUSEA MEDICATION *UNUSUAL SHORTNESS OF BREATH *UNUSUAL BRUISING OR BLEEDING *URINARY PROBLEMS (pain or burning when urinating, or frequent urination) *BOWEL PROBLEMS (unusual diarrhea, constipation, pain near the anus) TENDERNESS IN MOUTH AND THROAT WITH OR WITHOUT PRESENCE OF ULCERS (sore throat, sores in mouth, or a toothache) UNUSUAL RASH, SWELLING OR PAIN  UNUSUAL VAGINAL DISCHARGE OR ITCHING   Items with * indicate a potential emergency and should be followed up as soon as possible or go to the Emergency Department if any problems should occur.  Please show the CHEMOTHERAPY ALERT CARD or IMMUNOTHERAPY  ALERT CARD at check-in to the Emergency Department and triage nurse.  Should you have questions after your visit or need to cancel or reschedule your appointment, please contact [facility] [place] [facility]  [phone] and follow the prompts.  Office hours are 8:00 a.m. to 4:30 p.m. Monday - Friday. Please note that voicemails left after 4:00 p.m. may not be returned until the following business day.  We are closed weekends and major holidays. You have access to a nurse at all times for urgent questions. Please call the main number to the clinic [phone] and follow the prompts.  For any non-urgent questions, you may also contact your provider using MyChart. We now offer e-Visits for anyone 18 and older to request care online for non-urgent symptoms. For details visit [email or web address].   Also download the MyChart app! Go to the app store, search "MyChart", open the app, select [facility], and log in with your MyChart username and password.    

## 2023-05-15 NOTE — Progress Notes (Signed)
Patient is having some constipation which is causing the other symptoms; like stomach pain/discomfort, and the Hemorid.    Patient told me that Dr, Irving Copas was supposed to have sent over the two nausea and vomiting medications, when he called the pharmacy they told him there wasn't anything there.

## 2023-05-16 ENCOUNTER — Ambulatory Visit: Payer: Medicare HMO

## 2023-05-16 ENCOUNTER — Other Ambulatory Visit: Payer: Self-pay

## 2023-05-16 ENCOUNTER — Ambulatory Visit
Admission: RE | Admit: 2023-05-16 | Discharge: 2023-05-16 | Disposition: A | Discharge status: Home / Self Care | Payer: Medicare HMO | Source: Ambulatory Visit | Attending: Radiation Oncology | Admitting: Radiation Oncology

## 2023-05-16 ENCOUNTER — Ambulatory Visit: Payer: Medicare HMO | Admitting: Speech Pathology

## 2023-05-16 DIAGNOSIS — R4701 Aphasia: Secondary | ICD-10-CM

## 2023-05-16 DIAGNOSIS — R41841 Cognitive communication deficit: Secondary | ICD-10-CM

## 2023-05-16 DIAGNOSIS — C61 Malignant neoplasm of prostate: Secondary | ICD-10-CM | POA: Diagnosis not present

## 2023-05-16 LAB — RAD ONC ARIA SESSION SUMMARY
Course Elapsed Days: 24
Plan Fractions Treated to Date: 18
Plan Prescribed Dose Per Fraction: 2.5 Gy
Plan Total Fractions Prescribed: 28
Plan Total Prescribed Dose: 70 Gy
Reference Point Dosage Given to Date: 45 Gy
Reference Point Session Dosage Given: 2.5 Gy
Session Number: 18

## 2023-05-16 NOTE — Therapy (Signed)
OUTPATIENT SPEECH LANGUAGE PATHOLOGY APHASIA TREATMENT   Patient Name: Chris George MRN: [id number] DOB:[birth year], 67 y.o., male Today's Date: 05/16/2023  PCP: Vallarie Mare REFERRING PROVIDER: Maryjane Hurter, MD   End of Session - 05/16/23 1155     Visit Number 7    Number of Visits 24    Date for SLP Re-Evaluation 07/11/23    SLP Start Time 1005    SLP Stop Time  1105    SLP Time Calculation (min) 60 min    Activity Tolerance Patient tolerated treatment well             Past Medical History:  Diagnosis Date   A-fib (HCC)    Anemia    Arthritis    ankles   Cancer (HCC)    multiple myeloma   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HCC) 10/26/2019   Cardiomyopathy (HCC)    Chemotherapy-induced peripheral neuropathy (HCC)    CHF (congestive heart failure) (HCC)    Depression    Dysrhythmia    atrial fib   HLD (hyperlipidemia)    Hypercholesteremia    Hypertension    Moderate tricuspid insufficiency    Multiple myeloma (HCC)    not being treated right now per pt 11/11/19   Pneumonia    Sleep apnea    No CPAP.  OSA resolved with wt loss.   Past Surgical History:  Procedure Laterality Date   ATRIAL ABLATION SURGERY     CARDIAC SURGERY     A-Fib Ablations   COLONOSCOPY WITH PROPOFOL N/A 10/26/2019   Procedure: COLONOSCOPY WITH BIOPSY;  Surgeon: [name], MD;  Location: [facility];  Service: Endoscopy;  Laterality: N/A;   COLONOSCOPY WITH PROPOFOL N/A 09/03/2022   Procedure: COLONOSCOPY WITH PROPOFOL;  Surgeon: [name], MD;  Location: [facility];  Service: Endoscopy;  Laterality: N/A;   ESOPHAGOGASTRODUODENOSCOPY (EGD) WITH PROPOFOL N/A 10/26/2019   Procedure: ESOPHAGOGASTRODUODENOSCOPY (EGD) WITH BIOPSY;  Surgeon: [name], MD;  Location: [facility];  Service: Endoscopy;  Laterality: N/A;  sleep apnea   LAPAROSCOPIC PARTIAL COLECTOMY Right 11/17/2019   Procedure: LAPAROSCOPIC PARTIAL COLECTOMY RIGHT EXTENDED;  Surgeon: [name], MD;   Location: [facility];  Service: General;  Laterality: Right;   PORTACATH PLACEMENT N/A 12/28/2019   Procedure: INSERTION PORT-A-CATH Left subclavian;  Surgeon: [name], MD;  Location: [facility];  Service: General;  Laterality: N/A;   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   Diabetes mellitus type 2, controlled, without complications (HCC) 08/07/2021   Neuropathy 08/30/2020   Port-A-Cath in place    Abnormal ECG 11/04/2019   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HCC) 10/27/2019   Goals of care, counseling/discussion 05/18/2019   Multiple myeloma (HCC) 05/17/2019   Anemia 05/12/2019   Arthritis of left ankle 02/05/2018   Cardiomyopathy, idiopathic (HCC) 01/07/2018   Moderate tricuspid insufficiency 01/01/2017   OSA (obstructive sleep apnea) 01/01/2017   Chronic a-fib (HCC) 12/06/2015   Pure hypercholesterolemia 02/09/2015   Anticoagulant long-term use 04/29/2014   Hyperlipidemia 04/29/2014   Hypertension, essential, benign 04/26/2014    ONSET DATE: 04/05/23  REFERRING DIAG: aphasia  THERAPY DIAG:  Cognitive communication deficit  Aphasia  Rationale for Evaluation and Treatment Rehabilitation  SUBJECTIVE:   SUBJECTIVE STATEMENT: Pt pleasant and cooperative. Very motivated to work with SLP. Started chemo yesterday.   Pt accompanied by: self  PERTINENT HISTORY: Patient is  a 67 y.o. 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 Pt with PMHx of ca of the transverse colon, multiple myeloma, lymphoma, DM, HTN, HLD, CKD, and recently diagnosed prostate cancer. Pt undergoing radiation tx for cancer and reports plan to possibly resume chemotherapy.   DIAGNOSTIC FINDINGS:  MRI brain 04/06/23, "MRI brain findings-Acute infarct in the left parietal lobe with small focus of infarct in the  right temporal lobe (5:41, 5:38) evident on diffusion-weighted imaging and FLAIR sequences. A few nodular foci of low signal on susceptibility weighted imaging in the region of left parietal infarct (for example 17:46) may represent small areas of microhemorrhage or cortical vessel thrombus.Mild cerebral volume loss with ex vacuo dilatation of the CSF-containing spaces. There are scattered in confluent foci of signal abnormality within the periventricular and deep white matter.  These are nonspecific but commonly seen with small vessel ischemic changes. There is no midline shift. No extra-axial fluid collection. No mass. There is no abnormal enhancement."  PAIN:  Are you having pain? No  FALLS: Has patient fallen in last 6 months?  No  LIVING ENVIRONMENT: Lives with: lives with their family since stroke; pt lived alone prior to stroke Lives in: House/apartment  PLOF:  Level of assistance: Independent with ADLs, Comment: prior to stroke; now reports needing assistance with iADLs  Employment: Retired   PATIENT GOALS    to be able to participate in complex conversation with family and friends; to understanding what he is reading; to improve QoL; to be more independent   OBJECTIVE:  TODAY'S TREATMENT:  Skilled SLP treatment provided targeting assessment of reading comprehension as well as functional expressive communication.  Completed remaining subtests Reading Comprehension Battery for Aphasia with scores as follows:  VIII: Paragraph-Functional- 10/10 IX: Paragraph-Inferential: 9/10 X:Morpho-Syntax: 10/10  Pt required extra time, self-correction, and multiple instances of reading and re-reading to completed.  During informal conversational exchanges, pt with mild anomia and continued s/sx apraxia of speech (e.g. groping, revisions). Reviewed communication strategies for improved auditory comprehension and verbal expression. Pt able to use strategies on multiple occasions during session  with min assistance. Improving use of strategies noted.   Pt endorsed beginning to manage affairs via telephone. Discussed communication challenges as well as importance of self-advocacy while communicating with unfamiliar conversation partners. Pt has developed a script that discusses his current communication difficulty following stroke. Pt  reported improvement communication exchanges when his communication partner is aware of his difficulty.    PATIENT EDUCATION: Education details: communication strategies, self-advocacy, HEP Person educated: Patient Education method: Explanation Education comprehension: verbalized understanding; needs further reinforcement  HOME EXERCISE PROGRAM:   Additional worksheets provided for home practice    GOALS:  Goals reviewed with patient? Yes  SHORT TERM GOALS: Target date: 10 sessions  Patient will demonstrate improved expressive language skills during communication breakdowns by using multimodal means (semantic feature analysis, circumlocution, synonym/antonym use, gestures, writing, drawing) to repair with mod cues. Baseline: Goal status: INITIAL  2.  With Moderate A, patient will complete a semantic feature analysis with at least 3-5 relevant features for 8/10 target words with cues to improve word-finding skills.  Baseline:  Goal status: INITIAL  3.  With Moderate A, patient will generate sentences with 3 or more words during structured task at 70% accuracy in order to increase ability to communicate basic wants and needs.  Baseline:  Goal status: INITIAL  4.  Pt will participate in further assessment of functional reading and writing  to help determine level of assistance needed for language based iADL tasks. Baseline:  Goal status: INITIAL  5.  Patient will follow two step commands 70% acc in context of a functional activity.  Baseline:  Goal status: INITIAL  LONG TERM GOALS: Target date: 12 weeks  Patient will demonstrate  knowledge of appropriate activities to support functional receptive and expressive language outside of ST with assistance from family.  Baseline:  Goal status: INITIAL  2.  Patient will report improved confidence in conversations outside of home (appointments, friends, on the phone) than prior to ST, as measured by Communication Effectiveness Survey.  Baseline:  15/32 Goal status: INITIAL  3.  Patient and/or family will report use of strategies outside of ST to improve communication (use of scripts, pre-planning, semantic feature analysis, supported conversation).  Baseline:  Goal status: INITIAL  4.  Patient will demonstrate improved ability with describing events, storytelling, or sharing medical information through personalized strategies (requesting time, using SFA, drawing, etc) as measured by Communication Effectiveness Survey.  Baseline:  Goal status: INITIAL   ASSESSMENT:  CLINICAL IMPRESSION: Patient is a 67 y.o. 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 Pt seen for skilled SLP services targeting functional receptive/expressive communication in setting of aphasia. 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 Pt's continues with non-fluent and halting speech with revisions and articulatory groping. Subjective improvement in verbal expression endorsed by pt this date. See treatment details as above. SLP to continue to f/u per established POC.  OBJECTIVE IMPAIRMENTS include expressive language, receptive language, and aphasia. These impairments are limiting patient from managing medications, managing appointments, managing finances, household responsibilities, ADLs/IADLs, and effectively communicating at home and in community. Factors affecting potential to achieve goals and functional outcome are co-morbidities. Patient will benefit from skilled SLP services to address above impairments and  improve overall function.  REHAB POTENTIAL: Excellent  PLAN: SLP FREQUENCY: 2x/week  SLP DURATION: 12 weeks  PLANNED INTERVENTIONS: Cueing hierachy, Internal/external aids, Functional tasks, Multimodal communication approach, SLP instruction and feedback, Compensatory strategies, and Patient/family education    Clyde Canterbury, M.S., CCC-SLP Speech-Language Pathologist  [facility] Outpatient Rehabilitation at [facility] [address] [place], [place], [address] Phone: [phone]   Fax:  [phone]

## 2023-05-17 ENCOUNTER — Other Ambulatory Visit: Payer: Self-pay

## 2023-05-17 ENCOUNTER — Inpatient Hospital Stay: Payer: Medicare HMO

## 2023-05-17 ENCOUNTER — Ambulatory Visit
Admission: RE | Admit: 2023-05-17 | Discharge: 2023-05-17 | Disposition: A | Discharge status: Home / Self Care | Payer: Medicare HMO | Source: Ambulatory Visit | Attending: Radiation Oncology | Admitting: Radiation Oncology

## 2023-05-17 ENCOUNTER — Ambulatory Visit: Payer: Medicare HMO

## 2023-05-17 VITALS — BP 94/61

## 2023-05-17 DIAGNOSIS — C61 Malignant neoplasm of prostate: Secondary | ICD-10-CM | POA: Diagnosis not present

## 2023-05-17 DIAGNOSIS — C184 Malignant neoplasm of transverse colon: Secondary | ICD-10-CM

## 2023-05-17 DIAGNOSIS — Z5111 Encounter for antineoplastic chemotherapy: Secondary | ICD-10-CM | POA: Diagnosis not present

## 2023-05-17 LAB — RAD ONC ARIA SESSION SUMMARY
Course Elapsed Days: 25
Plan Fractions Treated to Date: 19
Plan Prescribed Dose Per Fraction: 2.5 Gy
Plan Total Fractions Prescribed: 28
Plan Total Prescribed Dose: 70 Gy
Reference Point Dosage Given to Date: 47.5 Gy
Reference Point Session Dosage Given: 2.5 Gy
Session Number: 19

## 2023-05-17 MED ORDER — SODIUM CHLORIDE 0.9% FLUSH
10.0000 mL | INTRAVENOUS | Status: DC | PRN
  Administered 2023-05-17: 10 mL
  Filled 2023-05-17: qty 10

## 2023-05-17 MED ORDER — HEPARIN SOD (PORK) LOCK FLUSH 100 UNIT/ML IV SOLN
500.0000 [IU] | Freq: Once | INTRAVENOUS | Status: AC | PRN
  Administered 2023-05-17: 500 [IU]
  Filled 2023-05-17: qty 5

## 2023-05-20 ENCOUNTER — Ambulatory Visit
Admission: RE | Admit: 2023-05-20 | Discharge: 2023-05-20 | Disposition: A | Discharge status: Home / Self Care | Payer: Medicare HMO | Source: Ambulatory Visit | Attending: Radiation Oncology | Admitting: Radiation Oncology

## 2023-05-20 ENCOUNTER — Ambulatory Visit: Payer: Medicare HMO | Admitting: Speech Pathology

## 2023-05-20 ENCOUNTER — Other Ambulatory Visit: Payer: Medicare HMO

## 2023-05-20 ENCOUNTER — Ambulatory Visit: Payer: Medicare HMO

## 2023-05-20 ENCOUNTER — Other Ambulatory Visit: Payer: Self-pay

## 2023-05-20 DIAGNOSIS — R4701 Aphasia: Secondary | ICD-10-CM | POA: Diagnosis not present

## 2023-05-20 DIAGNOSIS — R41841 Cognitive communication deficit: Secondary | ICD-10-CM

## 2023-05-20 DIAGNOSIS — C61 Malignant neoplasm of prostate: Secondary | ICD-10-CM | POA: Diagnosis not present

## 2023-05-20 LAB — RAD ONC ARIA SESSION SUMMARY
Course Elapsed Days: 28
Plan Fractions Treated to Date: 20
Plan Prescribed Dose Per Fraction: 2.5 Gy
Plan Total Fractions Prescribed: 28
Plan Total Prescribed Dose: 70 Gy
Reference Point Dosage Given to Date: 50 Gy
Reference Point Session Dosage Given: 2.5 Gy
Session Number: 20

## 2023-05-20 NOTE — Therapy (Signed)
OUTPATIENT SPEECH LANGUAGE PATHOLOGY APHASIA TREATMENT   Patient Name: Chris George MRN: [id number] DOB:January 06, 1956, 67 y.o., male Today's Date: 05/20/2023  PCP: Vallarie Mare REFERRING PROVIDER: Maryjane Hurter, MD   End of Session - 05/20/23 1207     Visit Number 8    Number of Visits 24    Date for SLP Re-Evaluation 07/11/23    SLP Start Time 1015    SLP Stop Time  1100    SLP Time Calculation (min) 45 min    Activity Tolerance Patient tolerated treatment well   Performance limited by fatigue            Past Medical History:  Diagnosis Date   A-fib (HCC)    Anemia    Arthritis    ankles   Cancer (HCC)    multiple myeloma   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HCC) 10/26/2019   Cardiomyopathy (HCC)    Chemotherapy-induced peripheral neuropathy (HCC)    CHF (congestive heart failure) (HCC)    Depression    Dysrhythmia    atrial fib   HLD (hyperlipidemia)    Hypercholesteremia    Hypertension    Moderate tricuspid insufficiency    Multiple myeloma (HCC)    not being treated right now per pt 11/11/19   Pneumonia    Sleep apnea    No CPAP.  OSA resolved with wt loss.   Past Surgical History:  Procedure Laterality Date   ATRIAL ABLATION SURGERY     CARDIAC SURGERY     A-Fib Ablations   COLONOSCOPY WITH PROPOFOL N/A 10/26/2019   Procedure: COLONOSCOPY WITH BIOPSY;  Surgeon: [name], MD;  Location: [facility];  Service: Endoscopy;  Laterality: N/A;   COLONOSCOPY WITH PROPOFOL N/A 09/03/2022   Procedure: COLONOSCOPY WITH PROPOFOL;  Surgeon: [name], MD;  Location: [facility];  Service: Endoscopy;  Laterality: N/A;   ESOPHAGOGASTRODUODENOSCOPY (EGD) WITH PROPOFOL N/A 10/26/2019   Procedure: ESOPHAGOGASTRODUODENOSCOPY (EGD) WITH BIOPSY;  Surgeon: [name], MD;  Location: [facility];  Service: Endoscopy;  Laterality: N/A;  sleep apnea   LAPAROSCOPIC PARTIAL COLECTOMY Right 11/17/2019   Procedure: LAPAROSCOPIC PARTIAL COLECTOMY RIGHT  EXTENDED;  Surgeon: [name], MD;  Location: [facility] OR[facility];  Service: General;  Laterality: Right;   PORTACATH PLACEMENT N/A 12/28/2019   Procedure: INSERTION PORT-A-CATH Left subclavian;  Surgeon: [name], MD;  Location: [facility] OR[facility];  Service: General;  Laterality: N/A;   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   Diabetes mellitus type 2, controlled, without complications (HCC) 08/07/2021   Neuropathy 08/30/2020   Port-A-Cath in place    Abnormal ECG 11/04/2019   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HCC) 10/27/2019   Goals of care, counseling/discussion 05/18/2019   Multiple myeloma (HCC) 05/17/2019   Anemia 05/12/2019   Arthritis of left ankle 02/05/2018   Cardiomyopathy, idiopathic (HCC) 01/07/2018   Moderate tricuspid insufficiency 01/01/2017   OSA (obstructive sleep apnea) 01/01/2017   Chronic a-fib (HCC) 12/06/2015   Pure hypercholesterolemia 02/09/2015   Anticoagulant long-term use 04/29/2014   Hyperlipidemia 04/29/2014   Hypertension, essential, benign 04/26/2014    ONSET DATE: 04/05/23  REFERRING DIAG: aphasia  THERAPY DIAG:  Cognitive communication deficit  Aphasia  Rationale for Evaluation and Treatment Rehabilitation  SUBJECTIVE:   SUBJECTIVE STATEMENT: Pt pleasant and cooperative. Appears fatigued. Endorsed increased night wakening since starting chemo.  Pt accompanied by: self  PERTINENT HISTORY: Patient is a 67 y.o. 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 Pt with PMHx of ca of the transverse colon, multiple myeloma, lymphoma, DM, HTN, HLD, CKD, and recently diagnosed prostate cancer. Pt undergoing radiation tx for cancer and reports plan to possibly resume chemotherapy.   DIAGNOSTIC FINDINGS:  MRI brain 04/06/23, "MRI brain findings-Acute infarct in the left  parietal lobe with small focus of infarct in the right temporal lobe (5:41, 5:38) evident on diffusion-weighted imaging and FLAIR sequences. A few nodular foci of low signal on susceptibility weighted imaging in the region of left parietal infarct (for example 17:46) may represent small areas of microhemorrhage or cortical vessel thrombus.Mild cerebral volume loss with ex vacuo dilatation of the CSF-containing spaces. There are scattered in confluent foci of signal abnormality within the periventricular and deep white matter.  These are nonspecific but commonly seen with small vessel ischemic changes. There is no midline shift. No extra-axial fluid collection. No mass. There is no abnormal enhancement."  PAIN:  Are you having pain? No  FALLS: Has patient fallen in last 6 months?  No  LIVING ENVIRONMENT: Lives with: lives with their family since stroke; pt lived alone prior to stroke Lives in: House/apartment  PLOF:  Level of assistance: Independent with ADLs, Comment: prior to stroke; now reports needing assistance with iADLs  Employment: Retired   PATIENT GOALS    to be able to participate in complex conversation with family and friends; to understanding what he is reading; to improve QoL; to be more independent   OBJECTIVE:  TODAY'S TREATMENT:  Skilled SLP treatment provided targeting assessment  functional expressive communication.  Pt with subjective reports increased difficulty with verbal expression since starting chemo which may be attributed to pt's reported lack of sleep. Reviewed effect of sleep on cognitive-linguistic functioning. Pt stated, "I feel like I'm regressing." Supportive counseling provided re: current communication impairment.  During informal conversational exchanges, pt with moderate anomia and continued s/sx apraxia of speech (e.g. groping, revisions). Reviewed compensatory strategies verbal expression. Pt required mod/max assistance to utilize during structured  tasks this date including during circumlocution task and semantic features analysis.     PATIENT EDUCATION: Education details: communication strategies, role of sleep in cognitive-linguistic functioning, HEP Person educated: Patient Education method: Explanation Education comprehension: verbalized understanding; needs further reinforcement  HOME EXERCISE PROGRAM:   Additional worksheets provided for home practice    GOALS:  Goals reviewed with patient? Yes  SHORT TERM GOALS: Target date: 10 sessions  Patient will demonstrate improved expressive language skills during communication breakdowns by using multimodal means (semantic feature analysis, circumlocution, synonym/antonym use, gestures, writing, drawing) to repair with mod cues. Baseline: Goal status: INITIAL  2.  With Moderate A, patient will complete a semantic feature analysis with at least 3-5 relevant features for 8/10 target words with cues to improve word-finding skills.  Baseline:  Goal status: INITIAL  3.  With Moderate A, patient will generate sentences with 3 or more words during structured task at 70% accuracy in order to increase ability to communicate basic wants and needs.  Baseline:  Goal status: INITIAL  4.  Pt will participate in further assessment of functional reading and writing to help determine level of assistance needed for language based iADL tasks. Baseline:  Goal status: INITIAL  5.  Patient will follow two step commands 70% acc in context of a functional activity.  Baseline:  Goal status: INITIAL  LONG TERM GOALS: Target  date: 12 weeks  Patient will demonstrate knowledge of appropriate activities to support functional receptive and expressive language outside of ST with assistance from family.  Baseline:  Goal status: INITIAL  2.  Patient will report improved confidence in conversations outside of home (appointments, friends, on the phone) than prior to ST, as measured by Communication  Effectiveness Survey.  Baseline:  15/32 Goal status: INITIAL  3.  Patient and/or family will report use of strategies outside of ST to improve communication (use of scripts, pre-planning, semantic feature analysis, supported conversation).  Baseline:  Goal status: INITIAL  4.  Patient will demonstrate improved ability with describing events, storytelling, or sharing medical information through personalized strategies (requesting time, using SFA, drawing, etc) as measured by Communication Effectiveness Survey.  Baseline:  Goal status: INITIAL   ASSESSMENT:  CLINICAL IMPRESSION: Patient is a 67 y.o. 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 Pt seen for skilled SLP services targeting functional receptive/expressive communication in setting of aphasia. 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 Pt's continues with non-fluent and halting speech with revisions and articulatory groping. Pt with good participation in tx session; however, pt's performance this date likely affecting by fatigue. See treatment details as above. SLP to continue to f/u per established POC.  OBJECTIVE IMPAIRMENTS include expressive language, receptive language, and aphasia. These impairments are limiting patient from managing medications, managing appointments, managing finances, household responsibilities, ADLs/IADLs, and effectively communicating at home and in community. Factors affecting potential to achieve goals and functional outcome are co-morbidities. Patient will benefit from skilled SLP services to address above impairments and improve overall function.  REHAB POTENTIAL: Excellent  PLAN: SLP FREQUENCY: 2x/week  SLP DURATION: 12 weeks  PLANNED INTERVENTIONS: Cueing hierachy, Internal/external aids, Functional tasks, Multimodal communication approach, SLP instruction and feedback, Compensatory strategies, and  Patient/family education    Clyde Canterbury, M.S., CCC-SLP Speech-Language Pathologist  [facility] Outpatient Rehabilitation at [facility] [address] [place], [place], [address] Phone: [phone]   Fax:  [phone]

## 2023-05-21 ENCOUNTER — Other Ambulatory Visit: Payer: Self-pay

## 2023-05-21 ENCOUNTER — Ambulatory Visit: Payer: Medicare HMO

## 2023-05-21 ENCOUNTER — Ambulatory Visit
Admission: RE | Admit: 2023-05-21 | Discharge: 2023-05-21 | Disposition: A | Discharge status: Home / Self Care | Payer: Medicare HMO | Source: Ambulatory Visit | Attending: Radiation Oncology | Admitting: Radiation Oncology

## 2023-05-21 ENCOUNTER — Other Ambulatory Visit: Payer: Self-pay | Admitting: *Deleted

## 2023-05-21 DIAGNOSIS — C61 Malignant neoplasm of prostate: Secondary | ICD-10-CM | POA: Diagnosis not present

## 2023-05-21 LAB — RAD ONC ARIA SESSION SUMMARY
Course Elapsed Days: 29
Plan Fractions Treated to Date: 21
Plan Prescribed Dose Per Fraction: 2.5 Gy
Plan Total Fractions Prescribed: 28
Plan Total Prescribed Dose: 70 Gy
Reference Point Dosage Given to Date: 52.5 Gy
Reference Point Session Dosage Given: 2.5 Gy
Session Number: 21

## 2023-05-21 MED ORDER — TAMSULOSIN HCL 0.4 MG PO CAPS: 0.4000 mg | ORAL_CAPSULE | Freq: Every day | ORAL | 6 refills | Status: DC

## 2023-05-22 ENCOUNTER — Ambulatory Visit: Payer: Medicare HMO

## 2023-05-22 ENCOUNTER — Other Ambulatory Visit: Payer: Self-pay

## 2023-05-22 ENCOUNTER — Ambulatory Visit
Admission: RE | Admit: 2023-05-22 | Discharge: 2023-05-22 | Disposition: A | Discharge status: Home / Self Care | Payer: Medicare HMO | Source: Ambulatory Visit | Attending: Radiation Oncology | Admitting: Radiation Oncology

## 2023-05-22 DIAGNOSIS — C61 Malignant neoplasm of prostate: Secondary | ICD-10-CM | POA: Diagnosis not present

## 2023-05-22 LAB — RAD ONC ARIA SESSION SUMMARY
Course Elapsed Days: 30
Plan Fractions Treated to Date: 22
Plan Prescribed Dose Per Fraction: 2.5 Gy
Plan Total Fractions Prescribed: 28
Plan Total Prescribed Dose: 70 Gy
Reference Point Dosage Given to Date: 55 Gy
Reference Point Session Dosage Given: 2.5 Gy
Session Number: 22

## 2023-05-23 ENCOUNTER — Ambulatory Visit
Admission: RE | Admit: 2023-05-23 | Discharge: 2023-05-23 | Disposition: A | Discharge status: Home / Self Care | Payer: Medicare HMO | Source: Ambulatory Visit | Attending: Radiation Oncology | Admitting: Radiation Oncology

## 2023-05-23 ENCOUNTER — Other Ambulatory Visit: Payer: Self-pay

## 2023-05-23 ENCOUNTER — Ambulatory Visit: Payer: Medicare HMO

## 2023-05-23 DIAGNOSIS — C61 Malignant neoplasm of prostate: Secondary | ICD-10-CM | POA: Diagnosis not present

## 2023-05-23 LAB — RAD ONC ARIA SESSION SUMMARY
Course Elapsed Days: 31
Plan Fractions Treated to Date: 23
Plan Prescribed Dose Per Fraction: 2.5 Gy
Plan Total Fractions Prescribed: 28
Plan Total Prescribed Dose: 70 Gy
Reference Point Dosage Given to Date: 57.5 Gy
Reference Point Session Dosage Given: 2.5 Gy
Session Number: 23

## 2023-05-24 ENCOUNTER — Ambulatory Visit: Payer: Medicare HMO

## 2023-05-27 ENCOUNTER — Other Ambulatory Visit: Payer: Self-pay

## 2023-05-27 ENCOUNTER — Ambulatory Visit
Admission: RE | Admit: 2023-05-27 | Discharge: 2023-05-27 | Disposition: A | Discharge status: Home / Self Care | Payer: Medicare HMO | Source: Ambulatory Visit | Attending: Radiation Oncology | Admitting: Radiation Oncology

## 2023-05-27 ENCOUNTER — Ambulatory Visit: Payer: Medicare HMO | Admitting: Speech Pathology

## 2023-05-27 ENCOUNTER — Ambulatory Visit: Payer: Medicare HMO

## 2023-05-27 DIAGNOSIS — R4701 Aphasia: Secondary | ICD-10-CM | POA: Diagnosis not present

## 2023-05-27 DIAGNOSIS — R41841 Cognitive communication deficit: Secondary | ICD-10-CM

## 2023-05-27 DIAGNOSIS — C61 Malignant neoplasm of prostate: Secondary | ICD-10-CM | POA: Diagnosis not present

## 2023-05-27 LAB — RAD ONC ARIA SESSION SUMMARY
Course Elapsed Days: 35
Plan Fractions Treated to Date: 24
Plan Prescribed Dose Per Fraction: 2.5 Gy
Plan Total Fractions Prescribed: 28
Plan Total Prescribed Dose: 70 Gy
Reference Point Dosage Given to Date: 60 Gy
Reference Point Session Dosage Given: 2.5 Gy
Session Number: 24

## 2023-05-27 MED FILL — Dexamethasone Sodium Phosphate Inj 100 MG/10ML: INTRAMUSCULAR | Qty: 1 | Status: AC

## 2023-05-27 NOTE — Therapy (Signed)
OUTPATIENT SPEECH LANGUAGE PATHOLOGY APHASIA TREATMENT   Patient Name: Chris George MRN: [id number] DOB:November 28, 1955, 67 y.o., male Today's Date: 05/27/2023  PCP: Vallarie Mare REFERRING PROVIDER: Maryjane Hurter, MD   End of Session - 05/27/23 1105     Visit Number 9    Number of Visits 24    Date for SLP Re-Evaluation 07/11/23    SLP Start Time 1016    SLP Stop Time  1105    SLP Time Calculation (min) 49 min             Past Medical History:  Diagnosis Date   A-fib (HCC)    Anemia    Arthritis    ankles   Cancer (HCC)    multiple myeloma   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HCC) 10/26/2019   Cardiomyopathy (HCC)    Chemotherapy-induced peripheral neuropathy (HCC)    CHF (congestive heart failure) (HCC)    Depression    Dysrhythmia    atrial fib   HLD (hyperlipidemia)    Hypercholesteremia    Hypertension    Moderate tricuspid insufficiency    Multiple myeloma (HCC)    not being treated right now per pt 11/11/19   Pneumonia    Sleep apnea    No CPAP.  OSA resolved with wt loss.   Past Surgical History:  Procedure Laterality Date   ATRIAL ABLATION SURGERY     CARDIAC SURGERY     A-Fib Ablations   COLONOSCOPY WITH PROPOFOL N/A 10/26/2019   Procedure: COLONOSCOPY WITH BIOPSY;  Surgeon: [name], MD;  Location: [facility];  Service: Endoscopy;  Laterality: N/A;   COLONOSCOPY WITH PROPOFOL N/A 09/03/2022   Procedure: COLONOSCOPY WITH PROPOFOL;  Surgeon: [name], MD;  Location: [facility];  Service: Endoscopy;  Laterality: N/A;   ESOPHAGOGASTRODUODENOSCOPY (EGD) WITH PROPOFOL N/A 10/26/2019   Procedure: ESOPHAGOGASTRODUODENOSCOPY (EGD) WITH BIOPSY;  Surgeon: [name], MD;  Location: [facility];  Service: Endoscopy;  Laterality: N/A;  sleep apnea   LAPAROSCOPIC PARTIAL COLECTOMY Right 11/17/2019   Procedure: LAPAROSCOPIC PARTIAL COLECTOMY RIGHT EXTENDED;  Surgeon: [name], MD;  Location: [facility];  Service: General;  Laterality:  Right;   PORTACATH PLACEMENT N/A 12/28/2019   Procedure: INSERTION PORT-A-CATH Left subclavian;  Surgeon: [name], MD;  Location: [facility];  Service: General;  Laterality: N/A;   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   Diabetes mellitus type 2, controlled, without complications (HCC) 08/07/2021   Neuropathy 08/30/2020   Port-A-Cath in place    Abnormal ECG 11/04/2019   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HCC) 10/27/2019   Goals of care, counseling/discussion 05/18/2019   Multiple myeloma (HCC) 05/17/2019   Anemia 05/12/2019   Arthritis of left ankle 02/05/2018   Cardiomyopathy, idiopathic (HCC) 01/07/2018   Moderate tricuspid insufficiency 01/01/2017   OSA (obstructive sleep apnea) 01/01/2017   Chronic a-fib (HCC) 12/06/2015   Pure hypercholesterolemia 02/09/2015   Anticoagulant long-term use 04/29/2014   Hyperlipidemia 04/29/2014   Hypertension, essential, benign 04/26/2014    ONSET DATE: 04/05/23  REFERRING DIAG: aphasia  THERAPY DIAG:  Cognitive communication deficit  Aphasia  Rationale for Evaluation and Treatment Rehabilitation  SUBJECTIVE:   SUBJECTIVE STATEMENT: Pt pleasant and cooperative. "I just feel run down. Something's not right. I'm going to talk to my doctor."  Pt accompanied by: self  PERTINENT HISTORY: Patient is a 67 y.o. male  s/p L MCA distribution infarct and R punctate temporal lobe infarct on 04/05/23. Pt with PMHx of ca of the transverse colon, multiple myeloma, lymphoma, DM, HTN, HLD, CKD, and recently diagnosed prostate cancer. Pt undergoing radiation tx for cancer and reports plan to possibly resume chemotherapy.   DIAGNOSTIC FINDINGS:  MRI brain 04/06/23, "MRI brain findings-Acute infarct in the left parietal lobe with small focus of infarct in the right temporal lobe (5:41, 5:38)  evident on diffusion-weighted imaging and FLAIR sequences. A few nodular foci of low signal on susceptibility weighted imaging in the region of left parietal infarct (for example 17:46) may represent small areas of microhemorrhage or cortical vessel thrombus.Mild cerebral volume loss with ex vacuo dilatation of the CSF-containing spaces. There are scattered in confluent foci of signal abnormality within the periventricular and deep white matter.  These are nonspecific but commonly seen with small vessel ischemic changes. There is no midline shift. No extra-axial fluid collection. No mass. There is no abnormal enhancement."  PAIN:  Are you having pain? No  FALLS: Has patient fallen in last 6 months?  No  LIVING ENVIRONMENT: Lives with: lives with their family since stroke; pt lived alone prior to stroke Lives in: House/apartment  PLOF:  Level of assistance: Independent with ADLs, Comment: prior to stroke; now reports needing assistance with iADLs  Employment: Retired   PATIENT GOALS    to be able to participate in complex conversation with family and friends; to understanding what he is reading; to improve QoL; to be more independent   OBJECTIVE:  TODAY'S TREATMENT:  Skilled SLP treatment provided targeting assessment functional expressive communication.  During informal conversational exchanges, pt with mild-mod anomia and continued s/sx apraxia of speech (e.g. groping, revisions). Marked improvement compared to previous SLP session. Pt reported successful conversation with church family yesterday, but difficulty with text messaging and reading. Will plan to further address in upcoming sessions. Supect working memory difficulty co-occurring with apraxia/aphasia.  Reviewed compensatory strategies verbal expression. Pt required min assistance to utilize during structured tasks including SFA and divergent naming (verbal). During written divergent naming task, spelling errors evident and often  paraphasic (e.g. [place] for [employer]). Pt with good error awareness for both spoken and written errors.    PATIENT EDUCATION: Education details: communication strategies, role of sleep in cognitive-linguistic functioning, HEP Person educated: Patient Education method: Explanation Education comprehension: verbalized understanding; needs further reinforcement  HOME EXERCISE PROGRAM:   Additional worksheets provided for home practice    GOALS:  Goals reviewed with patient? Yes  SHORT TERM GOALS: Target date: 10 sessions  Patient will demonstrate improved expressive language skills during communication breakdowns by using multimodal means (semantic feature analysis, circumlocution, synonym/antonym use, gestures, writing, drawing) to repair with mod cues. Baseline: Goal status: INITIAL  2.  With Moderate A, patient will complete a semantic feature analysis with at least 3-5 relevant features for 8/10 target words with cues to improve word-finding skills.  Baseline:  Goal status: INITIAL  3.  With Moderate A, patient will generate sentences with 3 or more words during structured task at 70% accuracy in order to increase ability to communicate basic wants and needs.  Baseline:  Goal status: INITIAL  4.  Pt will participate in further assessment of functional reading and writing to help determine level of assistance needed for language based iADL tasks. Baseline:  Goal status: INITIAL  5.  Patient will follow two step commands 70% acc in context of a functional activity.  Baseline:  Goal status:  INITIAL  LONG TERM GOALS: Target date: 12 weeks  Patient will demonstrate knowledge of appropriate activities to support functional receptive and expressive language outside of ST with assistance from family.  Baseline:  Goal status: INITIAL  2.  Patient will report improved confidence in conversations outside of home (appointments, friends, on the phone) than prior to ST, as measured by  Communication Effectiveness Survey.  Baseline:  15/32 Goal status: INITIAL  3.  Patient and/or family will report use of strategies outside of ST to improve communication (use of scripts, pre-planning, semantic feature analysis, supported conversation).  Baseline:  Goal status: INITIAL  4.  Patient will demonstrate improved ability with describing events, storytelling, or sharing medical information through personalized strategies (requesting time, using SFA, drawing, etc) as measured by Communication Effectiveness Survey.  Baseline:  Goal status: INITIAL   ASSESSMENT:  CLINICAL IMPRESSION: Patient is a 67 y.o. 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 Pt seen for skilled SLP services targeting functional receptive/expressive communication in setting of aphasia. 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 Pt's continues with non-fluent and halting speech with revisions and articulatory groping. Pt with good participation in tx session and continues to make progress toward goals. See treatment details as above. SLP to continue to f/u per established POC.  OBJECTIVE IMPAIRMENTS include expressive language, receptive language, and aphasia. These impairments are limiting patient from managing medications, managing appointments, managing finances, household responsibilities, ADLs/IADLs, and effectively communicating at home and in community. Factors affecting potential to achieve goals and functional outcome are co-morbidities. Patient will benefit from skilled SLP services to address above impairments and improve overall function.  REHAB POTENTIAL: Excellent  PLAN: SLP FREQUENCY: 2x/week  SLP DURATION: 12 weeks  PLANNED INTERVENTIONS: Cueing hierachy, Internal/external aids, Functional tasks, Multimodal communication approach, SLP instruction and feedback, Compensatory strategies, and Patient/family  education    Clyde Canterbury, M.S., CCC-SLP Speech-Language Pathologist  [facility] Outpatient Rehabilitation at [facility] [address] [place], [place], [address] Phone: [phone]   Fax:  [phone]

## 2023-05-28 ENCOUNTER — Inpatient Hospital Stay: Payer: Medicare HMO

## 2023-05-28 ENCOUNTER — Ambulatory Visit
Admission: RE | Admit: 2023-05-28 | Discharge: 2023-05-28 | Disposition: A | Discharge status: Home / Self Care | Payer: Medicare HMO | Source: Ambulatory Visit | Attending: Radiation Oncology | Admitting: Radiation Oncology

## 2023-05-28 ENCOUNTER — Ambulatory Visit: Payer: Medicare HMO

## 2023-05-28 ENCOUNTER — Inpatient Hospital Stay (HOSPITAL_BASED_OUTPATIENT_CLINIC_OR_DEPARTMENT_OTHER): Payer: Medicare HMO | Admitting: Oncology

## 2023-05-28 ENCOUNTER — Encounter: Payer: Self-pay | Admitting: Oncology

## 2023-05-28 ENCOUNTER — Other Ambulatory Visit: Payer: Self-pay

## 2023-05-28 VITALS — BP 103/62 | HR 46 | Temp 97.0°F | Resp 16 | Ht 69.0 in | Wt 213.0 lb

## 2023-05-28 DIAGNOSIS — C184 Malignant neoplasm of transverse colon: Secondary | ICD-10-CM

## 2023-05-28 DIAGNOSIS — C61 Malignant neoplasm of prostate: Secondary | ICD-10-CM | POA: Diagnosis not present

## 2023-05-28 DIAGNOSIS — Z5111 Encounter for antineoplastic chemotherapy: Secondary | ICD-10-CM | POA: Diagnosis not present

## 2023-05-28 DIAGNOSIS — D509 Iron deficiency anemia, unspecified: Secondary | ICD-10-CM

## 2023-05-28 LAB — CMP (CANCER CENTER ONLY)
ALT: 16 U/L (ref 0–44)
AST: 19 U/L (ref 15–41)
Albumin: 3.2 g/dL — ABNORMAL LOW (ref 3.5–5.0)
Alkaline Phosphatase: 152 U/L — ABNORMAL HIGH (ref 38–126)
Anion gap: 5 (ref 5–15)
BUN: <5 mg/dL — ABNORMAL LOW (ref 8–23)
CO2: 26 mmol/L (ref 22–32)
Calcium: 8.7 mg/dL — ABNORMAL LOW (ref 8.9–10.3)
Chloride: 107 mmol/L (ref 98–111)
Creatinine: 1.97 mg/dL — ABNORMAL HIGH (ref 0.61–1.24)
GFR, Estimated: 37 mL/min — ABNORMAL LOW (ref >60)
Glucose, Bld: 97 mg/dL (ref 70–99)
Potassium: 4.1 mmol/L (ref 3.5–5.1)
Sodium: 138 mmol/L (ref 135–145)
Total Bilirubin: 1 mg/dL (ref 0.3–1.2)
Total Protein: 6.6 g/dL (ref 6.5–8.1)

## 2023-05-28 LAB — RAD ONC ARIA SESSION SUMMARY
Course Elapsed Days: 36
Plan Fractions Treated to Date: 25
Plan Prescribed Dose Per Fraction: 2.5 Gy
Plan Total Fractions Prescribed: 28
Plan Total Prescribed Dose: 70 Gy
Reference Point Dosage Given to Date: 62.5 Gy
Reference Point Session Dosage Given: 2.5 Gy
Session Number: 25

## 2023-05-28 LAB — CBC WITH DIFFERENTIAL (CANCER CENTER ONLY)
Abs Immature Granulocytes: 0 10*3/uL (ref 0.00–0.07)
Basophils Absolute: 0 10*3/uL (ref 0.0–0.1)
Basophils Relative: 1 %
Eosinophils Absolute: 0.1 10*3/uL (ref 0.0–0.5)
Eosinophils Relative: 7 %
HCT: 24.7 % — ABNORMAL LOW (ref 39.0–52.0)
Hemoglobin: 7.8 g/dL — ABNORMAL LOW (ref 13.0–17.0)
Immature Granulocytes: 0 %
Lymphocytes Relative: 16 %
Lymphs Abs: 0.2 10*3/uL — ABNORMAL LOW (ref 0.7–4.0)
MCH: 28.2 pg (ref 26.0–34.0)
MCHC: 31.6 g/dL (ref 30.0–36.0)
MCV: 89.2 fL (ref 80.0–100.0)
Monocytes Absolute: 0.2 10*3/uL (ref 0.1–1.0)
Monocytes Relative: 12 %
Neutro Abs: 0.9 10*3/uL — ABNORMAL LOW (ref 1.7–7.7)
Neutrophils Relative %: 64 %
Platelet Count: 78 10*3/uL — ABNORMAL LOW (ref 150–400)
RBC: 2.77 MIL/uL — ABNORMAL LOW (ref 4.22–5.81)
RDW: 15.1 % (ref 11.5–15.5)
WBC Count: 1.4 10*3/uL — ABNORMAL LOW (ref 4.0–10.5)
nRBC: 0 % (ref 0.0–0.2)

## 2023-05-28 LAB — MAGNESIUM: Magnesium: 1.9 mg/dL (ref 1.7–2.4)

## 2023-05-28 MED ORDER — SODIUM CHLORIDE 0.9 % IV SOLN
Freq: Once | INTRAVENOUS | Status: AC
  Filled 2023-05-28: qty 250

## 2023-05-28 MED ORDER — HEPARIN SOD (PORK) LOCK FLUSH 100 UNIT/ML IV SOLN
500.0000 [IU] | Freq: Once | INTRAVENOUS | Status: AC | PRN
  Administered 2023-05-28: 500 [IU]
  Filled 2023-05-28: qty 5

## 2023-05-28 NOTE — Progress Notes (Signed)
[facility]  [phone] [phone]  ID: BRNADON George OB: Jan 03, 1956  MR#: [id number]  VQQ#:[id number]  Patient Care Team: Marina Goodell, MD as PCP - General (Family Medicine) [name], MD as Consulting Physician (Oncology) Morene Crocker, MD as Referring Physician (Neurology) Lamar Blinks, MD as Consulting Physician (Cardiology)  CHIEF COMPLAINT: Recurrent colon cancer, smoldering myeloma, prostate cancer.  INTERVAL HISTORY: Patient returns to clinic today for further evaluation and consideration of cycle 2 of FOLFIRI.  He tolerated his first treatment well without significant side effects.  He continues to have a mild expressive aphasia, but otherwise feels well.  He has no other neurologic complaints. He denies any recent fevers or illnesses.  He has a good appetite and denies weight loss.  He has no chest pain, shortness of breath, cough, or hemoptysis.  He denies any nausea, vomiting, constipation, or diarrhea.  He has no melena or hematochezia.  He has no urinary complaints.  Patient offers no further specific complaints today.  REVIEW OF SYSTEMS:   Review of Systems  Constitutional: Negative.  Negative for fever, malaise/fatigue and weight loss.  Respiratory: Negative.  Negative for cough, hemoptysis and shortness of breath.   Cardiovascular: Negative.  Negative for chest pain and leg swelling.  Gastrointestinal: Negative.  Negative for blood in stool, diarrhea and melena.  Genitourinary: Negative.  Negative for hematuria.  Musculoskeletal: Negative.  Negative for back pain and joint pain.  Skin: Negative.  Negative for rash.  Neurological:  Positive for tingling and sensory change. Negative for dizziness, focal weakness, weakness and headaches.  Psychiatric/Behavioral: Negative.  Negative for depression. The patient is not nervous/anxious.     As per HPI. Otherwise, a complete review of systems is negative.  PAST  MEDICAL HISTORY: Past Medical History:  Diagnosis Date   A-fib (HCC)    Anemia    Arthritis    ankles   Cancer (HCC)    multiple myeloma   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HCC) 10/26/2019   Cardiomyopathy (HCC)    Chemotherapy-induced peripheral neuropathy (HCC)    CHF (congestive heart failure) (HCC)    Depression    Dysrhythmia    atrial fib   HLD (hyperlipidemia)    Hypercholesteremia    Hypertension    Moderate tricuspid insufficiency    Multiple myeloma (HCC)    not being treated right now per pt 11/11/19   Pneumonia    Sleep apnea    No CPAP.  OSA resolved with wt loss.    PAST SURGICAL HISTORY: Past Surgical History:  Procedure Laterality Date   ATRIAL ABLATION SURGERY     CARDIAC SURGERY     A-Fib Ablations   COLONOSCOPY WITH PROPOFOL N/A 10/26/2019   Procedure: COLONOSCOPY WITH BIOPSY;  Surgeon: [name], MD;  Location: [facility];  Service: Endoscopy;  Laterality: N/A;   COLONOSCOPY WITH PROPOFOL N/A 09/03/2022   Procedure: COLONOSCOPY WITH PROPOFOL;  Surgeon: [name], MD;  Location: [facility];  Service: Endoscopy;  Laterality: N/A;   ESOPHAGOGASTRODUODENOSCOPY (EGD) WITH PROPOFOL N/A 10/26/2019   Procedure: ESOPHAGOGASTRODUODENOSCOPY (EGD) WITH BIOPSY;  Surgeon: [name], MD;  Location: [facility];  Service: Endoscopy;  Laterality: N/A;  sleep apnea   LAPAROSCOPIC PARTIAL COLECTOMY Right 11/17/2019   Procedure: LAPAROSCOPIC PARTIAL COLECTOMY RIGHT EXTENDED;  Surgeon: [name], MD;  Location: [facility];  Service: General;  Laterality: Right;   PORTACATH PLACEMENT N/A 12/28/2019   Procedure: INSERTION PORT-A-CATH Left subclavian;  Surgeon: Aleen Campi,  Elita Quick, MD;  Location: [facility];  Service: General;  Laterality: N/A;    FAMILY HISTORY: Family History  Problem Relation Age of Onset   Healthy Mother     ADVANCED DIRECTIVES (Y/N):  N  HEALTH MAINTENANCE: Social History   Tobacco Use   Smoking status: Never    Passive  exposure: Never   Smokeless tobacco: Never  Vaping Use   Vaping status: Never Used  Substance Use Topics   Alcohol use: Not Currently   Drug use: Never     Colonoscopy:  PAP:  Bone density:  Lipid panel:  No Known Allergies  Current Outpatient Medications  Medication Sig Dispense Refill   allopurinol (ZYLOPRIM) 100 MG tablet Take 100 mg by mouth daily.     apixaban (ELIQUIS) 5 MG TABS tablet Take 5 mg by mouth 2 (two) times daily.     APPLE CIDER VINEGAR PO Take 30-45 mLs by mouth every other day.      atorvastatin (LIPITOR) 80 MG tablet Take 80 mg by mouth daily.     colchicine 0.6 MG tablet Take 1 tablet (0.6 mg total) by mouth 2 (two) times daily. Until flare resolves. 20 tablet 1   furosemide (LASIX) 80 MG tablet Take 80 mg by mouth daily. Morning & late afternoon     MAGNESIUM PO Take by mouth.     Misc Natural Products (TART CHERRY ADVANCED PO) Take 60 mLs by mouth every other day.     Multiple Vitamins-Minerals (ZINC PO) Take by mouth.     potassium bicarbonate (K-LYTE) 25 MEQ disintegrating tablet Take 25 mEq by mouth 4 (four) times a week. In the afternoon.     prochlorperazine (COMPAZINE) 10 MG tablet Take 1 tablet (10 mg total) by mouth every 6 (six) hours as needed for nausea or vomiting. 60 tablet 1   tamsulosin (FLOMAX) 0.4 MG CAPS capsule Take 1 capsule (0.4 mg total) by mouth daily after supper. 30 capsule 6   Zinc Sulfate (ZINC 15 PO) Take by mouth.     digoxin (LANOXIN) 0.25 MG tablet Take 0.25 mg by mouth daily.  (Patient not taking: Reported on 04/23/2023)     No current facility-administered medications for this visit.    OBJECTIVE: Vitals:   05/28/23 0839 05/28/23 0843  BP: (!) 90/39 103/62  Pulse: (!) 46   Resp: 16   Temp: (!) 97 F (36.1 C)   SpO2: 100%       Body mass index is 31.45 kg/m.    ECOG FS:0 - Asymptomatic  General: Well-developed, well-nourished, no acute distress. Eyes: Pink conjunctiva, anicteric sclera. HEENT: Normocephalic,  moist mucous membranes. Lungs: No audible wheezing or coughing. Heart: Regular rate and rhythm. Abdomen: Soft, nontender, no obvious distention. Musculoskeletal: No edema, cyanosis, or clubbing. Neuro: Alert, answering all questions appropriately. Cranial nerves grossly intact. Skin: No rashes or petechiae noted. Psych: Normal affect.  LAB RESULTS:  Lab Results  Component Value Date   NA 138 05/28/2023   K 4.1 05/28/2023   CL 107 05/28/2023   CO2 26 05/28/2023   GLUCOSE 97 05/28/2023   BUN <5 (L) 05/28/2023   CREATININE 1.97 (H) 05/28/2023   CALCIUM 8.7 (L) 05/28/2023   PROT 6.6 05/28/2023   ALBUMIN 3.2 (L) 05/28/2023   AST 19 05/28/2023   ALT 16 05/28/2023   ALKPHOS 152 (H) 05/28/2023   BILITOT 1.0 05/28/2023   GFRNONAA 37 (L) 05/28/2023   GFRAA >60 07/29/2020    Lab Results  Component Value Date  WBC 1.4 (L) 05/28/2023   NEUTROABS 0.9 (L) 05/28/2023   HGB 7.8 (L) 05/28/2023   HCT 24.7 (L) 05/28/2023   MCV 89.2 05/28/2023   PLT 78 (L) 05/28/2023   Lab Results  Component Value Date   IRON 41 (L) 05/11/2020   TIBC 323 05/11/2020   IRONPCTSAT 13 (L) 05/11/2020   Lab Results  Component Value Date   FERRITIN 86 05/11/2020     STUDIES: No results found.   ASSESSMENT: Stage IIIc colon cancer, smoldering myeloma, prostate cancer.  PLAN:    Stage IIIc colon cancer: Patient completed cycle 9 of adjuvant FOLFOX on June 03, 2020.  Given his difficulties with treatment and declining performance status, treatment was discontinued altogether. Colonoscopy on September 03, 2022 did not reveal any significant pathology and recommendation was to repeat in 3 years.  CT scan from December 07, 2022 reviewed independently with enlarging aortocaval node now measuring 1.9 x 1.4 cm.  PET scan results from Mar 15, 2023 reviewed independently and reported as above with progressive retroperitoneal lymphadenopathy presumably related to patient's colon cancer.  His CEA also continues to  trend up and his most recent result was 194.  Delay cycle 2 of FOLFIRI today secondary to thrombocytopenia and neutropenia.  Treatment was previously dose reduced 10% given his renal insufficiency and baseline pancytopenia.  Will hold Avastin at this time given his recent stroke.  Return to clinic in 1 week for further evaluation and reconsideration of cycle 2.   Prostate cancer: Gleason's 3+4.  Patient's PSA is increased, but relatively stable at 32.4.  Imaging as above.  Nuclear medicine bone scan on December 13, 2022 was reported as negative.  PSMA PET per radiation oncology from Mar 21, 2023 did not reveal metastatic disease.  Patient is now undergoing XRT for local control disease and will complete treatment on May 30, 2023.  Smoldering myeloma: Chronic and unchanged.  Patient was initially diagnosed and treated in [place], [place] and treated with single agent Velcade in approximately 2013.  He declined maintenance treatment or referral for bone marrow transplant.  His M spike has ranged from 1.1-2.0 since July 2020.  His most recent result was 1.6.  Over the same timeframe his IgG component has ranged from [phone].  His most recent result is 2344.  Finally, his kappa free light chains have ranged from 24.8 -75.3 with his most recent result reported at 36.2.  Nuclear med bone scan as above.  His most recent bone marrow biopsy completed on May 28, 2019 revealed only 10 to 15% plasma cells with no clonality reported.  Cytogenetics were also reported as normal.  Continue to monitor closely.  Anemia: Hemoglobin is trending down 7.8.  Will get extra tube with next lab draw and consider transfusion if it continues to trend down. Neutropenia: Delay treatment as above.  Will add Udenyca with pump removal.  Dose reduced FOLFIRI as above. Thrombocytopenia: Patient platelet count is 78.  Dose reduction and delay treatment as above.  May have to consider treating every 3 weeks. Renal insufficiency: Creatinine  is trended up slightly to 1.97.  Patient will receive 1 L of IV fluids today.  Continue follow-up with nephrology as indicated.   Peripheral neuropathy: Chronic and unchanged.  Patient reports gabapentin did not help.  He was recently initiated on Lyrica 50 mg daily and given a referral to neurology.   Hypotension: 1 L of IV fluids as above.   Patient expressed understanding and was in agreement with this  plan. He also understands that He can call clinic at any time with any questions, concerns, or complaints.    [name], MD   05/28/2023 10:37 AM

## 2023-05-29 ENCOUNTER — Ambulatory Visit: Payer: Medicare HMO | Admitting: Oncology

## 2023-05-29 ENCOUNTER — Other Ambulatory Visit: Payer: Medicare HMO

## 2023-05-29 ENCOUNTER — Other Ambulatory Visit: Payer: Self-pay

## 2023-05-29 ENCOUNTER — Ambulatory Visit: Payer: Medicare HMO

## 2023-05-29 ENCOUNTER — Ambulatory Visit: Admission: RE | Admit: 2023-05-29 | Payer: Medicare HMO | Source: Ambulatory Visit

## 2023-05-29 DIAGNOSIS — C61 Malignant neoplasm of prostate: Secondary | ICD-10-CM | POA: Diagnosis not present

## 2023-05-29 LAB — RAD ONC ARIA SESSION SUMMARY
Course Elapsed Days: 37
Plan Fractions Treated to Date: 26
Plan Prescribed Dose Per Fraction: 2.5 Gy
Plan Total Fractions Prescribed: 28
Plan Total Prescribed Dose: 70 Gy
Reference Point Dosage Given to Date: 65 Gy
Reference Point Session Dosage Given: 2.5 Gy
Session Number: 26

## 2023-05-30 ENCOUNTER — Ambulatory Visit
Admission: RE | Admit: 2023-05-30 | Discharge: 2023-05-30 | Disposition: A | Discharge status: Home / Self Care | Payer: Medicare HMO | Source: Ambulatory Visit | Attending: Radiation Oncology | Admitting: Radiation Oncology

## 2023-05-30 ENCOUNTER — Inpatient Hospital Stay: Payer: Medicare HMO

## 2023-05-30 ENCOUNTER — Other Ambulatory Visit: Payer: Self-pay

## 2023-05-30 ENCOUNTER — Ambulatory Visit: Payer: Medicare HMO | Admitting: Speech Pathology

## 2023-05-30 ENCOUNTER — Ambulatory Visit: Payer: Medicare HMO

## 2023-05-30 DIAGNOSIS — C61 Malignant neoplasm of prostate: Secondary | ICD-10-CM | POA: Diagnosis not present

## 2023-05-30 LAB — RAD ONC ARIA SESSION SUMMARY
Course Elapsed Days: 38
Plan Fractions Treated to Date: 27
Plan Prescribed Dose Per Fraction: 2.5 Gy
Plan Total Fractions Prescribed: 28
Plan Total Prescribed Dose: 70 Gy
Reference Point Dosage Given to Date: 67.5 Gy
Reference Point Session Dosage Given: 2.5 Gy
Session Number: 27

## 2023-05-31 ENCOUNTER — Other Ambulatory Visit: Payer: Self-pay

## 2023-05-31 ENCOUNTER — Ambulatory Visit
Admission: RE | Admit: 2023-05-31 | Discharge: 2023-05-31 | Disposition: A | Discharge status: Home / Self Care | Payer: Medicare HMO | Source: Ambulatory Visit | Attending: Radiation Oncology | Admitting: Radiation Oncology

## 2023-05-31 DIAGNOSIS — C61 Malignant neoplasm of prostate: Secondary | ICD-10-CM | POA: Diagnosis not present

## 2023-05-31 LAB — RAD ONC ARIA SESSION SUMMARY
Course Elapsed Days: 39
Plan Fractions Treated to Date: 28
Plan Prescribed Dose Per Fraction: 2.5 Gy
Plan Total Fractions Prescribed: 28
Plan Total Prescribed Dose: 70 Gy
Reference Point Dosage Given to Date: 70 Gy
Reference Point Session Dosage Given: 2.5 Gy
Session Number: 28

## 2023-06-03 ENCOUNTER — Ambulatory Visit: Payer: Medicare HMO | Admitting: Speech Pathology

## 2023-06-03 DIAGNOSIS — R4701 Aphasia: Secondary | ICD-10-CM | POA: Diagnosis not present

## 2023-06-03 DIAGNOSIS — R41841 Cognitive communication deficit: Secondary | ICD-10-CM

## 2023-06-03 MED ORDER — FLUOROURACIL CHEMO INJECTION 2.5 GM/50ML
360.0000 mg/m2 | Freq: Once | INTRAVENOUS | Status: AC
  Administered 2023-06-04: 800 mg via INTRAVENOUS
  Filled 2023-06-03: qty 16

## 2023-06-03 MED ORDER — SODIUM CHLORIDE 0.9 % IV SOLN
2160.0000 mg/m2 | INTRAVENOUS | Status: DC
  Administered 2023-06-04: 5000 mg via INTRAVENOUS
  Filled 2023-06-03: qty 100

## 2023-06-03 MED FILL — Dexamethasone Sodium Phosphate Inj 100 MG/10ML: INTRAMUSCULAR | Qty: 1 | Status: AC

## 2023-06-03 NOTE — Therapy (Signed)
OUTPATIENT SPEECH LANGUAGE PATHOLOGY APHASIA TREATMENT /  10th VISIT PROGRESS NOTE  Speech Therapy Progress Note  Dates of Reporting Period: 04/18/23 to 06/03/23  Objective: Patient has been seen for 10 speech therapy sessions this reporting period targeting aphasia and functional receptive/expressive communication. Patient is making progress toward LTGs and met 5/5 STGs this reporting period. See skilled intervention, clinical impressions, and goals below for details.   Patient Name: Chris George MRN: [id number] DOB:[birth year], 67 y.o., male Today's Date: 06/03/2023  PCP: Vallarie Mare REFERRING PROVIDER: Maryjane Hurter, MD   End of Session - 06/03/23 1213     Visit Number 10    Number of Visits 24    Date for SLP Re-Evaluation 07/11/23    SLP Start Time 1104    SLP Stop Time  1204    SLP Time Calculation (min) 60 min    Activity Tolerance Patient tolerated treatment well             Past Medical History:  Diagnosis Date   A-fib (HCC)    Anemia    Arthritis    ankles   Cancer (HCC)    multiple myeloma   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HCC) 10/26/2019   Cardiomyopathy (HCC)    Chemotherapy-induced peripheral neuropathy (HCC)    CHF (congestive heart failure) (HCC)    Depression    Dysrhythmia    atrial fib   HLD (hyperlipidemia)    Hypercholesteremia    Hypertension    Moderate tricuspid insufficiency    Multiple myeloma (HCC)    not being treated right now per pt 11/11/19   Pneumonia    Sleep apnea    No CPAP.  OSA resolved with wt loss.   Past Surgical History:  Procedure Laterality Date   ATRIAL ABLATION SURGERY     CARDIAC SURGERY     A-Fib Ablations   COLONOSCOPY WITH PROPOFOL N/A 10/26/2019   Procedure: COLONOSCOPY WITH BIOPSY;  Surgeon: [name], MD;  Location: [facility];  Service: Endoscopy;  Laterality: N/A;   COLONOSCOPY WITH PROPOFOL N/A 09/03/2022   Procedure: COLONOSCOPY WITH PROPOFOL;  Surgeon: [name], MD;  Location: [facility];  Service: Endoscopy;  Laterality: N/A;   ESOPHAGOGASTRODUODENOSCOPY (EGD) WITH PROPOFOL N/A 10/26/2019   Procedure: ESOPHAGOGASTRODUODENOSCOPY (EGD) WITH BIOPSY;  Surgeon: [name], MD;  Location: [facility];  Service: Endoscopy;  Laterality: N/A;  sleep apnea   LAPAROSCOPIC PARTIAL COLECTOMY Right 11/17/2019   Procedure: LAPAROSCOPIC PARTIAL COLECTOMY RIGHT EXTENDED;  Surgeon: [name], MD;  Location: [facility] OR[facility];  Service: General;  Laterality: Right;   PORTACATH PLACEMENT N/A 12/28/2019   Procedure: INSERTION PORT-A-CATH Left subclavian;  Surgeon: [name], MD;  Location: [facility];  Service: General;  Laterality: N/A;   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   Diabetes mellitus type 2, controlled, without complications (HCC) 08/07/2021   Neuropathy 08/30/2020   Port-A-Cath in place    Abnormal ECG 11/04/2019   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HCC) 10/27/2019   Goals of care, counseling/discussion 05/18/2019   Multiple myeloma (HCC) 05/17/2019   Anemia 05/12/2019   Arthritis of left ankle 02/05/2018   Cardiomyopathy, idiopathic (HCC) 01/07/2018   Moderate tricuspid insufficiency 01/01/2017   OSA (obstructive sleep apnea) 01/01/2017   Chronic a-fib (HCC) 12/06/2015   Pure hypercholesterolemia 02/09/2015   Anticoagulant long-term use 04/29/2014   Hyperlipidemia 04/29/2014  Hypertension, essential, benign 04/26/2014    ONSET DATE: 04/05/23  REFERRING DIAG: aphasia  THERAPY DIAG:  Cognitive communication deficit  Aphasia  Rationale for Evaluation and Treatment Rehabilitation  SUBJECTIVE:   SUBJECTIVE STATEMENT: Pt pleasant and cooperative. Appears fatigued. Endorsed frustration with difficulty communicating in group setting at family reunion this weekend.  Pt accompanied by:  self  PERTINENT HISTORY: Patient is a 67 y.o. 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 Pt with PMHx of ca of the transverse colon, multiple myeloma, lymphoma, DM, HTN, HLD, CKD, and recently diagnosed prostate cancer. Pt undergoing radiation tx for cancer and reports plan to possibly resume chemotherapy.   DIAGNOSTIC FINDINGS:  MRI brain 04/06/23, "MRI brain findings-Acute infarct in the left parietal lobe with small focus of infarct in the right temporal lobe (5:41, 5:38) evident on diffusion-weighted imaging and FLAIR sequences. A few nodular foci of low signal on susceptibility weighted imaging in the region of left parietal infarct (for example 17:46) may represent small areas of microhemorrhage or cortical vessel thrombus.Mild cerebral volume loss with ex vacuo dilatation of the CSF-containing spaces. There are scattered in confluent foci of signal abnormality within the periventricular and deep white matter.  These are nonspecific but commonly seen with small vessel ischemic changes. There is no midline shift. No extra-axial fluid collection. No mass. There is no abnormal enhancement."  PAIN:  Are you having pain? No  FALLS: Has patient fallen in last 6 months?  No  LIVING ENVIRONMENT: Lives with: lives with their family since stroke; pt lived alone prior to stroke Lives in: House/apartment  PLOF:  Level of assistance: Independent with ADLs, Comment: prior to stroke; now reports needing assistance with iADLs  Employment: Retired   PATIENT GOALS    to be able to participate in complex conversation with family and friends; to understanding what he is reading; to improve QoL; to be more independent   OBJECTIVE:  TODAY'S TREATMENT:  Skilled SLP treatment provided targeting functional receptive/expressive communication including re-assessment using Western Aphasia Battery Revised.   Western Aphasia Battery-Revised (WAB-R): Part 1  Spontaneous  Speech:  Informational Content: 9/10 Fluency, Grammatical Content, and Paraphasias: 5/10 - c/b speech that is telegraphic, but fluent at times; marked wordfinding difficulty; halting; revisions  Auditory Verbal Comprehension:  Yes/No Questions: 60/60 Auditory Word Recognition: 60/60 Sequential Commands: 80/80  Repetition:  Single Words, Phrases, Sentences: 80/100  Naming and Word Finding: Object Naming: 57/60 Word Fluency: 14/20 Sentence Completion: 10/10 Responsive Speech: 10/10  Error types: phonemic paraphasias;  Cues given: extra time for revisions  Aphasia Quotient: 82.2 Aphasia Severity: Mild (76 and above) Aphasia Type: Anomic  Pt with subjective reports increased difficulty with verbal expression this weekend particularly in group content during family reunion. Pt endorsed frustration with CLOF,  pt stated, "I feel like I'm regressing." Pt also commented that he no longer has "good days" physically which he attributes to chemo, radiation, and multiple medications. Supportive counseling provided re: current communication impairment as well as education re: role of sleep and physical health in communication. Encouraged pt to pursue meeting with [facility] to improve community engagement and to continue to attend social events he enjoys with more preparation and different expectations. Pt agreeable to trialing script training in upcoming session and endorsed also wanting to work on text messaging.    PATIENT EDUCATION: Education details: communication strategies, progress to date Person educated: Patient Education method: Explanation Education comprehension: verbalized understanding; needs further reinforcement  HOME EXERCISE PROGRAM:   Additional worksheets provided for home practice    GOALS:  Goals reviewed with patient? Yes  SHORT TERM GOALS: Target date: 10 sessions; revised goal - additional 10 weeks  Patient will demonstrate improved expressive  language skills during communication breakdowns by using multimodal means (semantic feature analysis, circumlocution, synonym/antonym use, gestures, writing, drawing) to repair with mod cues. Baseline: Goal status: MET  2.  With Moderate A, patient will complete a semantic feature analysis with at least 3-5 relevant features for 8/10 target words with cues to improve word-finding skills.  Baseline:  Goal status: MET  3.  With min A, patient will generate sentences with 5 or more words during structured task at 80% accuracy in order to increase ability to communicate basic wants and needs.  Baseline:  Goal status: REVISED  4.  Pt will participate in further assessment of functional reading and writing to help determine level of assistance needed for language based iADL tasks. Baseline:  Goal status: MET  5.  Patient will follow two step commands 70% acc in context of a functional activity.  Baseline:  Goal status: MET  **New goals added 06/03/23; target date: 10 weeks   6. Pt will co-create x2 scripts for improved participation in medical appointments and                  social events with moderate assistance.               7. Pt will create simple text messages to desired communication partners with min A.   LONG TERM GOALS: Target date: 12 weeks  Patient will demonstrate knowledge of appropriate activities to support functional receptive and expressive language outside of ST with assistance from family.  Baseline:  Goal status: IN PROGRESS  2.  Patient will report improved confidence in conversations outside of home (appointments, friends, on the phone) than prior to ST, as measured by Communication Effectiveness Survey.  Baseline:  15/32 Goal status: IN PROGRESS; will complete prior to last session  3.  Patient and/or family will report use of strategies outside of ST to improve communication (use of scripts, pre-planning, semantic feature analysis, supported conversation).   Baseline:  Goal status: IN PROGRESS  4.  Patient will demonstrate improved ability with describing events, storytelling, or sharing medical information through personalized strategies (requesting time, using SFA, drawing, etc) as measured by Communication Effectiveness Survey.  Baseline:  Goal status: IN PROGRESS   ASSESSMENT:  CLINICAL IMPRESSION: Patient is a 67 y.o. 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 Pt making good progress to date and has met several goals. Pt re-assessed utilizing WAB-R. Pt made significant progress overall with an improved Aphasia Quotient of 82.2 (vs 70 on initial evaluation). 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 I recommend continued course of skilled SLP services targeting functional receptive and expressive communication to improve pt's participation in iADLs, preferred activities, and QoL.   OBJECTIVE IMPAIRMENTS include expressive language, receptive language, and aphasia. These impairments are limiting patient from managing medications, managing appointments, managing finances, household responsibilities, ADLs/IADLs, and effectively communicating at home and in community. Factors affecting potential to achieve goals and functional outcome are co-morbidities. Patient will benefit from skilled SLP services to address above impairments and improve overall function.  REHAB POTENTIAL: Excellent  PLAN: SLP FREQUENCY: 2x/week  SLP DURATION: 12 weeks  PLANNED INTERVENTIONS: Cueing hierachy, Internal/external aids, Functional tasks, Multimodal communication approach, SLP instruction and feedback, Compensatory strategies, and Patient/family education    Clyde Canterbury, M.S., CCC-SLP Speech-Language Pathologist  [facility] at [facility] [address] [place], [place], [address] Phone: [phone]   Fax:  [phone]

## 2023-06-04 ENCOUNTER — Inpatient Hospital Stay (HOSPITAL_BASED_OUTPATIENT_CLINIC_OR_DEPARTMENT_OTHER): Payer: Medicare HMO | Admitting: Oncology

## 2023-06-04 ENCOUNTER — Encounter: Payer: Self-pay | Admitting: Oncology

## 2023-06-04 ENCOUNTER — Inpatient Hospital Stay: Payer: Medicare HMO

## 2023-06-04 ENCOUNTER — Other Ambulatory Visit: Payer: Self-pay | Admitting: Oncology

## 2023-06-04 ENCOUNTER — Other Ambulatory Visit: Payer: Medicare HMO

## 2023-06-04 VITALS — BP 111/84 | HR 55

## 2023-06-04 DIAGNOSIS — C184 Malignant neoplasm of transverse colon: Secondary | ICD-10-CM | POA: Diagnosis not present

## 2023-06-04 DIAGNOSIS — Z5111 Encounter for antineoplastic chemotherapy: Secondary | ICD-10-CM | POA: Diagnosis not present

## 2023-06-04 DIAGNOSIS — D509 Iron deficiency anemia, unspecified: Secondary | ICD-10-CM

## 2023-06-04 LAB — CBC WITH DIFFERENTIAL (CANCER CENTER ONLY)
Abs Immature Granulocytes: 0 10*3/uL (ref 0.00–0.07)
Basophils Absolute: 0 10*3/uL (ref 0.0–0.1)
Basophils Relative: 1 %
Eosinophils Absolute: 0.2 10*3/uL (ref 0.0–0.5)
Eosinophils Relative: 12 %
HCT: 25.7 % — ABNORMAL LOW (ref 39.0–52.0)
Hemoglobin: 8 g/dL — ABNORMAL LOW (ref 13.0–17.0)
Immature Granulocytes: 0 %
Lymphocytes Relative: 19 %
Lymphs Abs: 0.2 10*3/uL — ABNORMAL LOW (ref 0.7–4.0)
MCH: 27.7 pg (ref 26.0–34.0)
MCHC: 31.1 g/dL (ref 30.0–36.0)
MCV: 88.9 fL (ref 80.0–100.0)
Monocytes Absolute: 0.4 10*3/uL (ref 0.1–1.0)
Monocytes Relative: 30 %
Neutro Abs: 0.5 10*3/uL — ABNORMAL LOW (ref 1.7–7.7)
Neutrophils Relative %: 38 %
Platelet Count: 119 10*3/uL — ABNORMAL LOW (ref 150–400)
RBC: 2.89 MIL/uL — ABNORMAL LOW (ref 4.22–5.81)
RDW: 16.1 % — ABNORMAL HIGH (ref 11.5–15.5)
WBC Count: 1.2 10*3/uL — ABNORMAL LOW (ref 4.0–10.5)
nRBC: 0 % (ref 0.0–0.2)

## 2023-06-04 LAB — CMP (CANCER CENTER ONLY)
ALT: 21 U/L (ref 0–44)
AST: 33 U/L (ref 15–41)
Albumin: 3.2 g/dL — ABNORMAL LOW (ref 3.5–5.0)
Alkaline Phosphatase: 170 U/L — ABNORMAL HIGH (ref 38–126)
Anion gap: 5 (ref 5–15)
BUN: 18 mg/dL (ref 8–23)
CO2: 26 mmol/L (ref 22–32)
Calcium: 8.6 mg/dL — ABNORMAL LOW (ref 8.9–10.3)
Chloride: 105 mmol/L (ref 98–111)
Creatinine: 1.54 mg/dL — ABNORMAL HIGH (ref 0.61–1.24)
GFR, Estimated: 49 mL/min — ABNORMAL LOW (ref >60)
Glucose, Bld: 113 mg/dL — ABNORMAL HIGH (ref 70–99)
Potassium: 4 mmol/L (ref 3.5–5.1)
Sodium: 136 mmol/L (ref 135–145)
Total Bilirubin: 0.7 mg/dL (ref 0.3–1.2)
Total Protein: 7.1 g/dL (ref 6.5–8.1)

## 2023-06-04 LAB — TYPE AND SCREEN
ABO/RH(D): O POS
Antibody Screen: NEGATIVE
Unit division: 0

## 2023-06-04 LAB — PREPARE RBC (CROSSMATCH)

## 2023-06-04 LAB — MAGNESIUM: Magnesium: 1.7 mg/dL (ref 1.7–2.4)

## 2023-06-04 LAB — BPAM RBC
Blood Product Expiration Date: 202408212359
Unit Type and Rh: 5100

## 2023-06-04 LAB — SAMPLE TO BLOOD BANK

## 2023-06-04 MED ORDER — SODIUM CHLORIDE 0.9 % IV SOLN
10.0000 mg | Freq: Once | INTRAVENOUS | Status: AC
  Administered 2023-06-04: 10 mg via INTRAVENOUS
  Filled 2023-06-04: qty 1
  Filled 2023-06-04: qty 10

## 2023-06-04 MED ORDER — PALONOSETRON HCL INJECTION 0.25 MG/5ML
0.2500 mg | Freq: Once | INTRAVENOUS | Status: AC
  Administered 2023-06-04: 0.25 mg via INTRAVENOUS
  Filled 2023-06-04: qty 5

## 2023-06-04 MED ORDER — SODIUM CHLORIDE 0.9 % IV SOLN
160.0000 mg/m2 | Freq: Once | INTRAVENOUS | Status: AC
  Administered 2023-06-04: 340 mg via INTRAVENOUS
  Filled 2023-06-04: qty 15

## 2023-06-04 MED ORDER — SODIUM CHLORIDE 0.9 % IV SOLN
Freq: Once | INTRAVENOUS | Status: AC
  Filled 2023-06-04: qty 250

## 2023-06-04 MED ORDER — SODIUM CHLORIDE 0.9 % IV SOLN
360.0000 mg/m2 | Freq: Once | INTRAVENOUS | Status: AC
  Administered 2023-06-04: 782 mg via INTRAVENOUS
  Filled 2023-06-04: qty 39.1

## 2023-06-04 MED ORDER — ATROPINE SULFATE 1 MG/ML IV SOLN
0.5000 mg | Freq: Once | INTRAVENOUS | Status: AC | PRN
  Administered 2023-06-04: 0.5 mg via INTRAVENOUS
  Filled 2023-06-04: qty 1

## 2023-06-04 NOTE — Patient Instructions (Signed)
[place] [facility]  Discharge Instructions: Thank you for choosing [facility] to provide your oncology and hematology care.  If you have a lab appointment with the Cancer Center, please go directly to the Cancer Center and check in at the registration area.  Wear comfortable clothing and clothing appropriate for easy access to any Portacath or PICC line.   We strive to give you quality time with your provider. You may need to reschedule your appointment if you arrive late (15 or more minutes).  Arriving late affects you and other patients whose appointments are after yours.  Also, if you miss three or more appointments without notifying the office, you may be dismissed from the clinic at the provider's discretion.      For prescription refill requests, have your pharmacy contact our office and allow 72 hours for refills to be completed.    Today you received the following chemotherapy and/or immunotherapy agents FOLFIRI      To help prevent nausea and vomiting after your treatment, we encourage you to take your nausea medication as directed.  BELOW ARE SYMPTOMS THAT SHOULD BE REPORTED IMMEDIATELY: *FEVER GREATER THAN 100.4 F (38 C) OR HIGHER *CHILLS OR SWEATING *NAUSEA AND VOMITING THAT IS NOT CONTROLLED WITH YOUR NAUSEA MEDICATION *UNUSUAL SHORTNESS OF BREATH *UNUSUAL BRUISING OR BLEEDING *URINARY PROBLEMS (pain or burning when urinating, or frequent urination) *BOWEL PROBLEMS (unusual diarrhea, constipation, pain near the anus) TENDERNESS IN MOUTH AND THROAT WITH OR WITHOUT PRESENCE OF ULCERS (sore throat, sores in mouth, or a toothache) UNUSUAL RASH, SWELLING OR PAIN  UNUSUAL VAGINAL DISCHARGE OR ITCHING   Items with * indicate a potential emergency and should be followed up as soon as possible or go to the Emergency Department if any problems should occur.  Please show the CHEMOTHERAPY ALERT CARD or IMMUNOTHERAPY ALERT CARD at check-in to  the Emergency Department and triage nurse.  Should you have questions after your visit or need to cancel or reschedule your appointment, please contact [place] [facility]  [phone] and follow the prompts.  Office hours are 8:00 a.m. to 4:30 p.m. Monday - Friday. Please note that voicemails left after 4:00 p.m. may not be returned until the following business day.  We are closed weekends and major holidays. You have access to a nurse at all times for urgent questions. Please call the main number to the clinic [phone] and follow the prompts.  For any non-urgent questions, you may also contact your provider using MyChart. We now offer e-Visits for anyone 55 and older to request care online for non-urgent symptoms. For details visit [email or web address].   Also download the MyChart app! Go to the app store, search "MyChart", open the app, select , and log in with your MyChart username and password.

## 2023-06-04 NOTE — Progress Notes (Signed)
States he has not been feeling very well. Has been feeling tired all the time.

## 2023-06-04 NOTE — Progress Notes (Signed)
[facility]  Telephone:([phone] Fax:([phone]) [phone]  ID: Chris George OB: 12/15/1955  MR#: [id number]  UUV#:[id number]  Patient Care Team: Marina Goodell, MD as PCP - General (Family Medicine) [name], MD as Consulting Physician (Oncology) Morene Crocker, MD as Referring Physician (Neurology) Lamar Blinks, MD as Consulting Physician (Cardiology)  CHIEF COMPLAINT: Recurrent colon cancer, smoldering myeloma, prostate cancer.  INTERVAL HISTORY: Patient returns to clinic today for further evaluation and reconsideration of cycle 2 of FOLFIRI.  He complains of increased weakness and fatigue, but otherwise feels well.  He continues to have a mild expressive aphasia, but no other neurologic complaints. He denies any recent fevers or illnesses.  He has a good appetite and denies weight loss.  He has no chest pain, shortness of breath, cough, or hemoptysis.  He denies any nausea, vomiting, constipation, or diarrhea.  He has no melena or hematochezia.  He has no urinary complaints.  Patient offers no further specific complaints today.  REVIEW OF SYSTEMS:   Review of Systems  Constitutional:  Positive for malaise/fatigue. Negative for fever and weight loss.  Respiratory: Negative.  Negative for cough, hemoptysis and shortness of breath.   Cardiovascular: Negative.  Negative for chest pain and leg swelling.  Gastrointestinal: Negative.  Negative for blood in stool, diarrhea and melena.  Genitourinary: Negative.  Negative for hematuria.  Musculoskeletal: Negative.  Negative for back pain and joint pain.  Skin: Negative.  Negative for rash.  Neurological:  Positive for tingling, sensory change and weakness. Negative for dizziness, focal weakness and headaches.  Psychiatric/Behavioral: Negative.  Negative for depression. The patient is not nervous/anxious.     As per HPI. Otherwise, a complete review of systems is negative.  PAST MEDICAL HISTORY: Past  Medical History:  Diagnosis Date   A-fib (HCC)    Anemia    Arthritis    ankles   Cancer (HCC)    multiple myeloma   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HCC) 10/26/2019   Cardiomyopathy (HCC)    Chemotherapy-induced peripheral neuropathy (HCC)    CHF (congestive heart failure) (HCC)    Depression    Dysrhythmia    atrial fib   HLD (hyperlipidemia)    Hypercholesteremia    Hypertension    Moderate tricuspid insufficiency    Multiple myeloma (HCC)    not being treated right now per pt 11/11/19   Pneumonia    Sleep apnea    No CPAP.  OSA resolved with wt loss.    PAST SURGICAL HISTORY: Past Surgical History:  Procedure Laterality Date   ATRIAL ABLATION SURGERY     CARDIAC SURGERY     A-Fib Ablations   COLONOSCOPY WITH PROPOFOL N/A 10/26/2019   Procedure: COLONOSCOPY WITH BIOPSY;  Surgeon: [name], MD;  Location: [facility];  Service: Endoscopy;  Laterality: N/A;   COLONOSCOPY WITH PROPOFOL N/A 09/03/2022   Procedure: COLONOSCOPY WITH PROPOFOL;  Surgeon: [name], MD;  Location: [facility];  Service: Endoscopy;  Laterality: N/A;   ESOPHAGOGASTRODUODENOSCOPY (EGD) WITH PROPOFOL N/A 10/26/2019   Procedure: ESOPHAGOGASTRODUODENOSCOPY (EGD) WITH BIOPSY;  Surgeon: [name], MD;  Location: [facility];  Service: Endoscopy;  Laterality: N/A;  sleep apnea   LAPAROSCOPIC PARTIAL COLECTOMY Right 11/17/2019   Procedure: LAPAROSCOPIC PARTIAL COLECTOMY RIGHT EXTENDED;  Surgeon: [name], MD;  Location: [facility] OR[facility];  Service: General;  Laterality: Right;   PORTACATH PLACEMENT N/A 12/28/2019   Procedure: INSERTION PORT-A-CATH Left subclavian;  Surgeon: [name], MD;  Location:  [facility] OR[facility];  Service: General;  Laterality: N/A;    FAMILY HISTORY: Family History  Problem Relation Age of Onset   Healthy Mother     ADVANCED DIRECTIVES (Y/N):  N  HEALTH MAINTENANCE: Social History   Tobacco Use   Smoking status: Never    Passive exposure: Never    Smokeless tobacco: Never  Vaping Use   Vaping status: Never Used  Substance Use Topics   Alcohol use: Not Currently   Drug use: Never     Colonoscopy:  PAP:  Bone density:  Lipid panel:  No Known Allergies  Current Outpatient Medications  Medication Sig Dispense Refill   allopurinol (ZYLOPRIM) 100 MG tablet Take 100 mg by mouth daily.     apixaban (ELIQUIS) 5 MG TABS tablet Take 5 mg by mouth 2 (two) times daily.     APPLE CIDER VINEGAR PO Take 30-45 mLs by mouth every other day.      atorvastatin (LIPITOR) 80 MG tablet Take 80 mg by mouth daily.     colchicine 0.6 MG tablet Take 1 tablet (0.6 mg total) by mouth 2 (two) times daily. Until flare resolves. 20 tablet 1   furosemide (LASIX) 80 MG tablet Take 40 mg by mouth daily. Morning & late afternoon     MAGNESIUM PO Take by mouth.     Misc Natural Products (TART CHERRY ADVANCED PO) Take 60 mLs by mouth every other day.     Multiple Vitamins-Minerals (ZINC PO) Take by mouth.     potassium bicarbonate (K-LYTE) 25 MEQ disintegrating tablet Take 25 mEq by mouth 4 (four) times a week. In the afternoon.     prochlorperazine (COMPAZINE) 10 MG tablet Take 1 tablet (10 mg total) by mouth every 6 (six) hours as needed for nausea or vomiting. 60 tablet 1   tamsulosin (FLOMAX) 0.4 MG CAPS capsule Take 1 capsule (0.4 mg total) by mouth daily after supper. 30 capsule 6   Zinc Sulfate (ZINC 15 PO) Take by mouth.     digoxin (LANOXIN) 0.25 MG tablet Take 0.25 mg by mouth daily.  (Patient not taking: Reported on 04/23/2023)     No current facility-administered medications for this visit.   Facility-Administered Medications Ordered in Other Visits  Medication Dose Route Frequency Provider Last Rate Last Admin   fluorouracil (ADRUCIL) 5,000 mg in sodium chloride 0.9 % 150 mL chemo infusion  2,160 mg/m2 (Treatment Plan Recorded) Intravenous 1 day or 1 dose Orlie Dakin, Tollie Pizza, MD       fluorouracil (ADRUCIL) chemo injection 800 mg  360 mg/m2  (Treatment Plan Recorded) Intravenous Once [name], MD        OBJECTIVE: Vitals:   06/04/23 0836  BP: 94/73  Pulse: (!) 43  Resp: 16  Temp: (!) 96.8 F (36 C)  SpO2: 100%      Body mass index is 32.19 kg/m.    ECOG FS:0 - Asymptomatic  General: Well-developed, well-nourished, no acute distress. Eyes: Pink conjunctiva, anicteric sclera. HEENT: Normocephalic, moist mucous membranes. Lungs: No audible wheezing or coughing. Heart: Regular rate and rhythm. Abdomen: Soft, nontender, no obvious distention. Musculoskeletal: No edema, cyanosis, or clubbing. Neuro: Alert, answering all questions appropriately. Cranial nerves grossly intact. Skin: No rashes or petechiae noted. Psych: Normal affect.  LAB RESULTS:  Lab Results  Component Value Date   NA 136 06/04/2023   K 4.0 06/04/2023   CL 105 06/04/2023   CO2 26 06/04/2023   GLUCOSE 113 (H) 06/04/2023   BUN 18 06/04/2023  CREATININE 1.54 (H) 06/04/2023   CALCIUM 8.6 (L) 06/04/2023   PROT 7.1 06/04/2023   ALBUMIN 3.2 (L) 06/04/2023   AST 33 06/04/2023   ALT 21 06/04/2023   ALKPHOS 170 (H) 06/04/2023   BILITOT 0.7 06/04/2023   GFRNONAA 49 (L) 06/04/2023   GFRAA >60 07/29/2020    Lab Results  Component Value Date   WBC 1.2 (L) 06/04/2023   NEUTROABS 0.5 (L) 06/04/2023   HGB 8.0 (L) 06/04/2023   HCT 25.7 (L) 06/04/2023   MCV 88.9 06/04/2023   PLT 119 (L) 06/04/2023   Lab Results  Component Value Date   IRON 41 (L) 05/11/2020   TIBC 323 05/11/2020   IRONPCTSAT 13 (L) 05/11/2020   Lab Results  Component Value Date   FERRITIN 86 05/11/2020     STUDIES: No results found.   ASSESSMENT: Stage IIIc colon cancer, smoldering myeloma, prostate cancer.  PLAN:    Stage IIIc colon cancer: Patient completed cycle 9 of adjuvant FOLFOX on June 03, 2020.  Given his difficulties with treatment and declining performance status, treatment was discontinued altogether. Colonoscopy on September 03, 2022 did not  reveal any significant pathology and recommendation was to repeat in 3 years.  CT scan from December 07, 2022 reviewed independently with enlarging aortocaval node now measuring 1.9 x 1.4 cm.  PET scan results from Mar 15, 2023 reviewed independently with progressive retroperitoneal lymphadenopathy presumably related to patient's colon cancer.  His CEA also continues to trend up and his most recent result was 194.  Despite neutropenia, we will proceed with cycle 2 of FOLFIRI today.  Treatment was previously dose reduced 10% given his renal insufficiency and baseline pancytopenia.  Will hold Avastin at this time given his recent stroke.  Return to clinic in 2 days for pump removal and Udenyca.  Patient also received 1 unit of packed red blood cells.  He will then return to clinic in 2 weeks for further evaluation and consideration of cycle 3.    Prostate cancer: Gleason's 3+4.  Patient's PSA is increased, but relatively stable at 32.4.  Imaging as above.  Nuclear medicine bone scan on December 13, 2022 was reported as negative.  PSMA PET per radiation oncology from Mar 21, 2023 did not reveal metastatic disease.  Patient completed XRT for local control disease on May 30, 2023.  Smoldering myeloma: Chronic and unchanged.  Patient was initially diagnosed and treated in [place], [place] and treated with single agent Velcade in approximately 2013.  He declined maintenance treatment or referral for bone marrow transplant.  His M spike has ranged from 1.1-2.0 since July 2020.  His most recent result was 1.6.  Over the same timeframe his IgG component has ranged from [phone].  His most recent result is 2344.  Finally, his kappa free light chains have ranged from 24.8 -75.3 with his most recent result reported at 36.2.  Nuclear med bone scan as above.  His most recent bone marrow biopsy completed on May 28, 2019 revealed only 10 to 15% plasma cells with no clonality reported.  Cytogenetics were also reported as normal.   Continue to monitor closely.  Anemia: Chronic and unchanged.  Patient's hemoglobin is 8.0.  1 unit of packed red blood cells along with pump removal as above. Neutropenia: Proceed cautiously with treatment as above.  Will add Udenyca with pump removal.  Dose reduced FOLFIRI as above. Thrombocytopenia: Platelets improved to 119.  May have to consider treating every 3 weeks. Renal insufficiency: Creatinine improved  to 1.54.  Continue follow-up with nephrology as indicated.   Peripheral neuropathy: Chronic and unchanged.  Patient reports gabapentin did not help.  He was recently initiated on Lyrica 50 mg daily and given a referral to neurology.   Hypotension: Improved.  Patient expressed understanding and was in agreement with this plan. He also understands that He can call clinic at any time with any questions, concerns, or complaints.    [name], MD   06/04/2023 12:15 PM

## 2023-06-05 ENCOUNTER — Ambulatory Visit: Payer: Medicare HMO | Admitting: Speech Pathology

## 2023-06-05 DIAGNOSIS — R41841 Cognitive communication deficit: Secondary | ICD-10-CM

## 2023-06-05 DIAGNOSIS — R4701 Aphasia: Secondary | ICD-10-CM

## 2023-06-05 NOTE — Therapy (Signed)
OUTPATIENT SPEECH LANGUAGE PATHOLOGY APHASIA TREATMENT   Patient Name: Chris George MRN: [id number] DOB:[birth year], 67 y.o., male Today's Date: 06/05/2023  PCP: Vallarie Mare REFERRING PROVIDER: Maryjane Hurter, MD   End of Session - 06/05/23 1307     Visit Number 11    Number of Visits 24    Date for SLP Re-Evaluation 07/11/23    SLP Start Time 1145    SLP Stop Time  1235    SLP Time Calculation (min) 50 min    Activity Tolerance Patient tolerated treatment well             Past Medical History:  Diagnosis Date   A-fib (HCC)    Anemia    Arthritis    ankles   Cancer (HCC)    multiple myeloma   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HCC) 10/26/2019   Cardiomyopathy (HCC)    Chemotherapy-induced peripheral neuropathy (HCC)    CHF (congestive heart failure) (HCC)    Depression    Dysrhythmia    atrial fib   HLD (hyperlipidemia)    Hypercholesteremia    Hypertension    Moderate tricuspid insufficiency    Multiple myeloma (HCC)    not being treated right now per pt 11/11/19   Pneumonia    Sleep apnea    No CPAP.  OSA resolved with wt loss.   Past Surgical History:  Procedure Laterality Date   ATRIAL ABLATION SURGERY     CARDIAC SURGERY     A-Fib Ablations   COLONOSCOPY WITH PROPOFOL N/A 10/26/2019   Procedure: COLONOSCOPY WITH BIOPSY;  Surgeon: [name], MD;  Location: [facility];  Service: Endoscopy;  Laterality: N/A;   COLONOSCOPY WITH PROPOFOL N/A 09/03/2022   Procedure: COLONOSCOPY WITH PROPOFOL;  Surgeon: [name], MD;  Location: [facility];  Service: Endoscopy;  Laterality: N/A;   ESOPHAGOGASTRODUODENOSCOPY (EGD) WITH PROPOFOL N/A 10/26/2019   Procedure: ESOPHAGOGASTRODUODENOSCOPY (EGD) WITH BIOPSY;  Surgeon: [name], MD;  Location: [facility];  Service: Endoscopy;  Laterality: N/A;  sleep apnea   LAPAROSCOPIC PARTIAL COLECTOMY Right 11/17/2019   Procedure: LAPAROSCOPIC PARTIAL COLECTOMY RIGHT EXTENDED;  Surgeon: [name], MD;   Location: [facility];  Service: General;  Laterality: Right;   PORTACATH PLACEMENT N/A 12/28/2019   Procedure: INSERTION PORT-A-CATH Left subclavian;  Surgeon: [name], MD;  Location: [facility] OR[facility];  Service: General;  Laterality: N/A;   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   Diabetes mellitus type 2, controlled, without complications (HCC) 08/07/2021   Neuropathy 08/30/2020   Port-A-Cath in place    Abnormal ECG 11/04/2019   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HCC) 10/27/2019   Goals of care, counseling/discussion 05/18/2019   Multiple myeloma (HCC) 05/17/2019   Anemia 05/12/2019   Arthritis of left ankle 02/05/2018   Cardiomyopathy, idiopathic (HCC) 01/07/2018   Moderate tricuspid insufficiency 01/01/2017   OSA (obstructive sleep apnea) 01/01/2017   Chronic a-fib (HCC) 12/06/2015   Pure hypercholesterolemia 02/09/2015   Anticoagulant long-term use 04/29/2014   Hyperlipidemia 04/29/2014   Hypertension, essential, benign 04/26/2014    ONSET DATE: 04/05/23  REFERRING DIAG: aphasia  THERAPY DIAG:  Cognitive communication deficit  Aphasia  Rationale for Evaluation and Treatment Rehabilitation  SUBJECTIVE:   SUBJECTIVE STATEMENT: Pt pleasant and cooperative. Brighter affect. "Feeling good."  Pt accompanied by: self  PERTINENT HISTORY: Patient is a 67 y.o. male s/p L  MCA distribution infarct and R punctate temporal lobe infarct on 04/05/23. Pt with PMHx of ca of the transverse colon, multiple myeloma, lymphoma, DM, HTN, HLD, CKD, and recently diagnosed prostate cancer. Pt undergoing radiation tx for cancer and reports plan to possibly resume chemotherapy.   DIAGNOSTIC FINDINGS:  MRI brain 04/06/23, "MRI brain findings-Acute infarct in the left parietal lobe with small focus of infarct in the right temporal lobe (5:41,  5:38) evident on diffusion-weighted imaging and FLAIR sequences. A few nodular foci of low signal on susceptibility weighted imaging in the region of left parietal infarct (for example 17:46) may represent small areas of microhemorrhage or cortical vessel thrombus.Mild cerebral volume loss with ex vacuo dilatation of the CSF-containing spaces. There are scattered in confluent foci of signal abnormality within the periventricular and deep white matter.  These are nonspecific but commonly seen with small vessel ischemic changes. There is no midline shift. No extra-axial fluid collection. No mass. There is no abnormal enhancement."  PAIN:  Are you having pain? No  FALLS: Has patient fallen in last 6 months?  No  LIVING ENVIRONMENT: Lives with: lives with their family since stroke; pt lived alone prior to stroke Lives in: House/apartment  PLOF:  Level of assistance: Independent with ADLs, Comment: prior to stroke; now reports needing assistance with iADLs  Employment: Retired   PATIENT GOALS    to be able to participate in complex conversation with family and friends; to understanding what he is reading; to improve QoL; to be more independent   OBJECTIVE:  TODAY'S TREATMENT:  Skilled SLP treatment provided targeting expressive communication. Co-constructed basic script for pt's use with PCP regarding medication questions with mod-max A. Drill work completed with min A - repeating words of increasing length. HEP provided with additional worksheets. SLP accompanied pt to [facility] as pt would like to join. Pt able to inquire about [facility] services at mod I level - pt required extra time for anomia/motor planning due to apraxia.   Education completed re: communication strategies, script training, relationship between physical health and communication difficulty, and progress to date. Pt verbalized understanding/agreement.     PATIENT EDUCATION: Education details: as above Person educated:  Patient Education method: Explanation Education comprehension: verbalized understanding; needs further reinforcement  HOME EXERCISE PROGRAM:   Additional worksheets provided for home practice    GOALS:  Goals reviewed with patient? Yes  SHORT TERM GOALS: Target date: 10 sessions; revised goal - additional 10 weeks  Patient will demonstrate improved expressive language skills during communication breakdowns by using multimodal means (semantic feature analysis, circumlocution, synonym/antonym use, gestures, writing, drawing) to repair with mod cues. Baseline: Goal status: MET  2.  With Moderate A, patient will complete a semantic feature analysis with at least 3-5 relevant features for 8/10 target words with cues to improve word-finding skills.  Baseline:  Goal status: MET  3.  With min A, patient will generate sentences with 5 or more words during structured task at 80% accuracy in order to increase ability to communicate basic wants and needs.  Baseline:  Goal status: REVISED  4.  Pt will participate in further assessment of functional reading and writing to help determine level of assistance needed for language based iADL tasks. Baseline:  Goal status: MET  5.  Patient will follow two step commands 70% acc in context of a functional activity.  Baseline:  Goal status: MET  **New goals added 06/03/23; target date: 10 weeks   6. Pt will co-create x2 scripts for  improved participation in medical appointments and                  social events with moderate assistance.               7. Pt will create simple text messages to desired communication partners with min A.   LONG TERM GOALS: Target date: 12 weeks  Patient will demonstrate knowledge of appropriate activities to support functional receptive and expressive language outside of ST with assistance from family.  Baseline:  Goal status: IN PROGRESS  2.  Patient will report improved confidence in conversations outside of  home (appointments, friends, on the phone) than prior to ST, as measured by Communication Effectiveness Survey.  Baseline:  15/32 Goal status: IN PROGRESS; will complete prior to last session  3.  Patient and/or family will report use of strategies outside of ST to improve communication (use of scripts, pre-planning, semantic feature analysis, supported conversation).  Baseline:  Goal status: IN PROGRESS  4.  Patient will demonstrate improved ability with describing events, storytelling, or sharing medical information through personalized strategies (requesting time, using SFA, drawing, etc) as measured by Communication Effectiveness Survey.  Baseline:  Goal status: IN PROGRESS   ASSESSMENT:  CLINICAL IMPRESSION: Patient is a 67 y.o. 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 Pt making good progress. 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 See details of tx session above.  I recommend continued course of skilled SLP services targeting functional receptive and expressive communication to improve pt's participation in iADLs, preferred activities, and QoL.   OBJECTIVE IMPAIRMENTS include expressive language, receptive language, and aphasia. These impairments are limiting patient from managing medications, managing appointments, managing finances, household responsibilities, ADLs/IADLs, and effectively communicating at home and in community. Factors affecting potential to achieve goals and functional outcome are co-morbidities. Patient will benefit from skilled SLP services to address above impairments and improve overall function.  REHAB POTENTIAL: Excellent  PLAN: SLP FREQUENCY: 2x/week  SLP DURATION: 12 weeks  PLANNED INTERVENTIONS: Cueing hierachy, Internal/external aids, Functional tasks, Multimodal  communication approach, SLP instruction and feedback, Compensatory strategies, and Patient/family education    Clyde Canterbury, M.S., CCC-SLP Speech-Language Pathologist  [facility] at [facility] [address] [place], [place], [address] Phone: [phone]   Fax:  [phone]

## 2023-06-06 ENCOUNTER — Inpatient Hospital Stay: Payer: Medicare HMO | Attending: Oncology

## 2023-06-06 ENCOUNTER — Inpatient Hospital Stay: Payer: Medicare HMO

## 2023-06-06 VITALS — BP 100/70 | HR 78 | Temp 98.0°F | Resp 16

## 2023-06-06 DIAGNOSIS — C184 Malignant neoplasm of transverse colon: Secondary | ICD-10-CM

## 2023-06-06 DIAGNOSIS — C186 Malignant neoplasm of descending colon: Secondary | ICD-10-CM | POA: Insufficient documentation

## 2023-06-06 DIAGNOSIS — Z79899 Other long term (current) drug therapy: Secondary | ICD-10-CM | POA: Insufficient documentation

## 2023-06-06 DIAGNOSIS — C182 Malignant neoplasm of ascending colon: Secondary | ICD-10-CM | POA: Insufficient documentation

## 2023-06-06 DIAGNOSIS — D649 Anemia, unspecified: Secondary | ICD-10-CM | POA: Diagnosis not present

## 2023-06-06 DIAGNOSIS — Z5111 Encounter for antineoplastic chemotherapy: Secondary | ICD-10-CM | POA: Insufficient documentation

## 2023-06-06 DIAGNOSIS — Z5189 Encounter for other specified aftercare: Secondary | ICD-10-CM | POA: Diagnosis not present

## 2023-06-06 MED ORDER — DIPHENHYDRAMINE HCL 50 MG/ML IJ SOLN
25.0000 mg | Freq: Once | INTRAMUSCULAR | Status: AC
  Administered 2023-06-06: 25 mg via INTRAVENOUS
  Filled 2023-06-06: qty 1

## 2023-06-06 MED ORDER — SODIUM CHLORIDE 0.9% IV SOLUTION
250.0000 mL | Freq: Once | INTRAVENOUS | Status: AC
  Administered 2023-06-06: 250 mL via INTRAVENOUS
  Filled 2023-06-06: qty 250

## 2023-06-06 MED ORDER — HEPARIN SOD (PORK) LOCK FLUSH 100 UNIT/ML IV SOLN
500.0000 [IU] | Freq: Once | INTRAVENOUS | Status: AC | PRN
  Administered 2023-06-06: 500 [IU]
  Filled 2023-06-06: qty 5

## 2023-06-06 MED ORDER — PEGFILGRASTIM-JMDB 6 MG/0.6ML ~~LOC~~ SOSY
6.0000 mg | PREFILLED_SYRINGE | Freq: Once | SUBCUTANEOUS | Status: AC
  Administered 2023-06-06: 6 mg via SUBCUTANEOUS
  Filled 2023-06-06: qty 0.6

## 2023-06-06 MED ORDER — ACETAMINOPHEN 325 MG PO TABS
650.0000 mg | ORAL_TABLET | Freq: Once | ORAL | Status: AC
  Administered 2023-06-06: 650 mg via ORAL
  Filled 2023-06-06: qty 2

## 2023-06-06 NOTE — Patient Instructions (Signed)
Pegfilgrastim Injection What is this medication? PEGFILGRASTIM (PEG fil gra stim) lowers the risk of infection in people who are receiving chemotherapy. It works by Systems analyst make more white blood cells, which protects your body from infection. It may also be used to help people who have been exposed to high doses of radiation. This medicine may be used for other purposes; ask your health care provider or pharmacist if you have questions. COMMON BRAND NAME(S): Cherly Hensen, Neulasta, Nyvepria, Stimufend, UDENYCA, UDENYCA ONBODY, Ziextenzo What should I tell my care team before I take this medication? They need to know if you have any of these conditions: Kidney disease Latex allergy Ongoing radiation therapy Sickle cell disease Skin reactions to acrylic adhesives (On-Body Injector only) An unusual or allergic reaction to pegfilgrastim, filgrastim, other medications, foods, dyes, or preservatives Pregnant or trying to get pregnant Breast-feeding How should I use this medication? This medication is for injection under the skin. If you get this medication at home, you will be taught how to prepare and give the pre-filled syringe or how to use the On-body Injector. Refer to the patient Instructions for Use for detailed instructions. Use exactly as directed. Tell your care team immediately if you suspect that the On-body Injector may not have performed as intended or if you suspect the use of the On-body Injector resulted in a missed or partial dose. It is important that you put your used needles and syringes in a special sharps container. Do not put them in a trash can. If you do not have a sharps container, call your pharmacist or care team to get one. Talk to your care team about the use of this medication in children. While this medication may be prescribed for selected conditions, precautions do apply. Overdosage: If you think you have taken too much of this medicine contact a poison  control center or emergency room at once. NOTE: This medicine is only for you. Do not share this medicine with others. What if I miss a dose? It is important not to miss your dose. Call your care team if you miss your dose. If you miss a dose due to an On-body Injector failure or leakage, a new dose should be administered as soon as possible using a single prefilled syringe for manual use. What may interact with this medication? Interactions have not been studied. This list may not describe all possible interactions. Give your health care provider a list of all the medicines, herbs, non-prescription drugs, or dietary supplements you use. Also tell them if you smoke, drink alcohol, or use illegal drugs. Some items may interact with your medicine. What should I watch for while using this medication? Your condition will be monitored carefully while you are receiving this medication. You may need blood work done while you are taking this medication. Talk to your care team about your risk of cancer. You may be more at risk for certain types of cancer if you take this medication. If you are going to need a MRI, CT scan, or other procedure, tell your care team that you are using this medication (On-Body Injector only). What side effects may I notice from receiving this medication? Side effects that you should report to your care team as soon as possible: Allergic reactions--skin rash, itching, hives, swelling of the face, lips, tongue, or throat Capillary leak syndrome--stomach or muscle pain, unusual weakness or fatigue, feeling faint or lightheaded, decrease in the amount of urine, swelling of the ankles, hands, or  feet, trouble breathing High white blood cell level--fever, fatigue, trouble breathing, night sweats, change in vision, weight loss Inflammation of the aorta--fever, fatigue, back, chest, or stomach pain, severe headache Kidney injury (glomerulonephritis)--decrease in the amount of urine, red  or dark brown urine, foamy or bubbly urine, swelling of the ankles, hands, or feet Shortness of breath or trouble breathing Spleen injury--pain in upper left stomach or shoulder Unusual bruising or bleeding Side effects that usually do not require medical attention (report to your care team if they continue or are bothersome): Bone pain Pain in the hands or feet This list may not describe all possible side effects. Call your doctor for medical advice about side effects. You may report side effects to FDA at 1-800-FDA-1088. Where should I keep my medication? Keep out of the reach of children. If you are using this medication at home, you will be instructed on how to store it. Throw away any unused medication after the expiration date on the label. NOTE: This sheet is a summary. It may not cover all possible information. If you have questions about this medicine, talk to your doctor, pharmacist, or health care provider.  2024 Elsevier/Gold Standard (2021-09-22 00:00:00) Blood Transfusion, Adult A blood transfusion is a procedure in which you receive blood through an IV tube. You may need this procedure because of: A bleeding disorder. An illness. An injury. A surgery. The blood may come from someone else (a donor). You may also be able to donate blood for yourself before a surgery. The blood given in a transfusion may be made up of different types of cells. You may get: Red blood cells. These carry oxygen to the cells in the body. Platelets. These help your blood to clot. Plasma. This is the liquid part of your blood. It carries proteins and other substances through the body. White blood cells. These help you fight infections. If you have a clotting disorder, you may also get other types of blood products. Depending on the type of blood product, this procedure may take 1-4 hours to complete. Tell your doctor about: Any bleeding problems you have. Any reactions you have had during a blood  transfusion in the past. Any allergies you have. All medicines you are taking, including vitamins, herbs, eye drops, creams, and over-the-counter medicines. Any surgeries you have had. Any medical conditions you have. Whether you are pregnant or may be pregnant. What are the risks? Talk with your health care provider about risks. The most common problems include: A mild allergic reaction. This includes red, swollen areas of skin (hives) and itching. Fever or chills. This may be the body's response to new blood cells received. This may happen during or up to 4 hours after the transfusion. More serious problems may include: A serious allergic reaction. This includes breathing trouble or swelling around the face and lips. Too much fluid in the lungs. This may cause breathing problems. Lung injury. This causes breathing trouble and low oxygen in the blood. This can happen within hours of the transfusion or days later. Too much iron. This can happen after getting many blood transfusions over a period of time. An infection or virus passed through the blood. This is rare. Donated blood is carefully tested before it is given. Your body's defense system (immune system) trying to attack the new blood cells. This is rare. Symptoms may include fever, chills, nausea, low blood pressure, and low back or chest pain. Donated cells attacking healthy tissues. This is rare. What happens  before the procedure? You will have a blood test to find out your blood type. The test also finds out what type of blood your body will accept and matches it to the donor type. If you are going to have a planned surgery, you may be able to donate your own blood. This may be done in case you need a transfusion. You will have your temperature, blood pressure, and pulse checked. You may receive medicine to help prevent an allergic reaction. This may be done if you have had a reaction to a transfusion before. This medicine may be  given to you by mouth or through an IV tube. What happens during the procedure?  An IV tube will be put into one of your veins. The bag of blood will be attached to your IV tube. Then, the blood will enter through your vein. Your temperature, blood pressure, and pulse will be checked often. This is done to find early signs of a transfusion reaction. Tell your nurse right away if you have any of these symptoms: Shortness of breath or trouble breathing. Chest or back pain. Fever or chills. Red, swollen areas of skin or itching. If you have any signs or symptoms of a reaction, your transfusion will be stopped. You may also be given medicine. When the transfusion is finished, your IV tube will be taken out. Pressure may be put on the IV site for a few minutes. A bandage (dressing) will be put on the IV site. The procedure may vary among doctors and hospitals. What happens after the procedure? You will be monitored until you leave the hospital or clinic. This includes checking your temperature, blood pressure, pulse, breathing rate, and blood oxygen level. Your blood may be tested to see how you have responded to the transfusion. You may be warmed with fluids or blankets. This is done to keep the temperature of your body normal. If you have your procedure in an outpatient setting, you will be told whom to contact to report any reactions. Where to find more information Visit the [employer]: redcross.org Summary A blood transfusion is a procedure in which you receive blood through an IV tube. The blood you are given may be made up of different blood cells. You may receive red blood cells, platelets, plasma, or white blood cells. Your temperature, blood pressure, and pulse will be checked often. After the procedure, your blood may be tested to see how you have responded. This information is not intended to replace advice given to you by your health care provider. Make sure you discuss any  questions you have with your health care provider. Document Revised: 01/19/2022 Document Reviewed: 01/19/2022 Elsevier Patient Education  2024 [employer].

## 2023-06-10 ENCOUNTER — Encounter: Payer: Medicare HMO | Admitting: Speech Pathology

## 2023-06-10 ENCOUNTER — Ambulatory Visit: Payer: Medicare HMO | Attending: Family Medicine | Admitting: Speech Pathology

## 2023-06-10 ENCOUNTER — Telehealth: Payer: Self-pay

## 2023-06-10 DIAGNOSIS — R4701 Aphasia: Secondary | ICD-10-CM | POA: Insufficient documentation

## 2023-06-10 DIAGNOSIS — R41841 Cognitive communication deficit: Secondary | ICD-10-CM | POA: Insufficient documentation

## 2023-06-10 NOTE — Telephone Encounter (Signed)
Pt "no showed" for scheduled appointment today. SLP called pt's personal cell phone and was unsuccessful in contacting pt as no voicemail currently set up. Will plan to see pt for next scheduled appointment on Monday, 8/12, @ 1145.

## 2023-06-11 ENCOUNTER — Encounter: Payer: Self-pay | Admitting: Oncology

## 2023-06-12 ENCOUNTER — Other Ambulatory Visit: Payer: Medicare HMO

## 2023-06-12 ENCOUNTER — Ambulatory Visit: Payer: Medicare HMO

## 2023-06-12 ENCOUNTER — Ambulatory Visit: Payer: Medicare HMO | Admitting: Medical Oncology

## 2023-06-12 DIAGNOSIS — I5020 Unspecified systolic (congestive) heart failure: Secondary | ICD-10-CM | POA: Insufficient documentation

## 2023-06-13 ENCOUNTER — Encounter: Payer: Medicare HMO | Admitting: Speech Pathology

## 2023-06-14 ENCOUNTER — Other Ambulatory Visit: Payer: Self-pay

## 2023-06-15 ENCOUNTER — Other Ambulatory Visit: Payer: Self-pay

## 2023-06-15 ENCOUNTER — Encounter: Payer: Self-pay | Admitting: Oncology

## 2023-06-15 ENCOUNTER — Encounter: Payer: Self-pay | Admitting: Emergency Medicine

## 2023-06-15 ENCOUNTER — Ambulatory Visit
Admission: EM | Admit: 2023-06-15 | Discharge: 2023-06-15 | Disposition: A | Discharge status: Home / Self Care | Payer: Medicare HMO | Attending: Emergency Medicine | Admitting: Emergency Medicine

## 2023-06-15 DIAGNOSIS — M109 Gout, unspecified: Secondary | ICD-10-CM

## 2023-06-15 MED ORDER — DEXAMETHASONE SODIUM PHOSPHATE 10 MG/ML IJ SOLN
10.0000 mg | Freq: Once | INTRAMUSCULAR | Status: AC
  Administered 2023-06-15: 10 mg via INTRAMUSCULAR

## 2023-06-15 NOTE — Discharge Instructions (Addendum)
Continue taking your allopurinol and other medications as previously prescribed.  Continue to avoid triggers such as red meat, leafy green vegetables, or oily fish.  You may also continue take your colchicine as previously prescribed.  If your symptoms do not improve, or they worsen, I recommend that you either follow-up with your PCP or with [facility].

## 2023-06-15 NOTE — ED Triage Notes (Signed)
Patient reports history of Gout. Patient takes Allopurinol and Colchicine for his Gout.  Patient reports left knee pain that started 2 days ago.  Patient denies injury or falls.

## 2023-06-15 NOTE — ED Provider Notes (Signed)
[facility]    CSN: [id number] Arrival date & time: 06/15/23  1312      History   Chief Complaint Chief Complaint  Patient presents with   Knee Pain    left    HPI Chris George is a 67 y.o. male.   HPI  67 year old male with a past medical history significant for atrial fibrillation, anemia, arthritis, gout, colon cancer, prostate cancer, multiple myeloma, CHF, hypercholesterolemia, hypertension, tricuspid insufficiency, and sleep apnea presents for evaluation of 2 days worth of left knee pain.  He reports that this is similar to his gout flare she has had in the past and he has been having them every 6 to 8 weeks.  He is on allopurinol and he has taken several doses of colchicine without any improvement of symptoms.  He reports that when his symptoms get as bad he typically needs a shot of Decadron and his symptoms improved.  He denies any injury.  He also denies any redness, heat, or swelling to his left knee.  No fever.  Past Medical History:  Diagnosis Date   A-fib (HCC)    Anemia    Arthritis    ankles   Cancer (HCC)    multiple myeloma   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HCC) 10/26/2019   Cardiomyopathy (HCC)    Chemotherapy-induced peripheral neuropathy (HCC)    CHF (congestive heart failure) (HCC)    Depression    Dysrhythmia    atrial fib   HLD (hyperlipidemia)    Hypercholesteremia    Hypertension    Moderate tricuspid insufficiency    Multiple myeloma (HCC)    not being treated right now per pt 11/11/19   Pneumonia    Sleep apnea    No CPAP.  OSA resolved with wt loss.    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   Diabetes mellitus type 2, controlled, without complications (HCC) 08/07/2021   Neuropathy 08/30/2020   Port-A-Cath in place    Abnormal ECG 11/04/2019   Cancer of  transverse colon (HCC) 10/27/2019   Goals of care, counseling/discussion 05/18/2019   Multiple myeloma (HCC) 05/17/2019   Anemia 05/12/2019   Arthritis of left ankle 02/05/2018   Cardiomyopathy, idiopathic (HCC) 01/07/2018   Moderate tricuspid insufficiency 01/01/2017   OSA (obstructive sleep apnea) 01/01/2017   Chronic a-fib (HCC) 12/06/2015   Pure hypercholesterolemia 02/09/2015   Anticoagulant long-term use 04/29/2014   Hyperlipidemia 04/29/2014   Hypertension, essential, benign 04/26/2014    Past Surgical History:  Procedure Laterality Date   ATRIAL ABLATION SURGERY     CARDIAC SURGERY     A-Fib Ablations   COLONOSCOPY WITH PROPOFOL N/A 10/26/2019   Procedure: COLONOSCOPY WITH BIOPSY;  Surgeon: [name], MD;  Location: [facility];  Service: Endoscopy;  Laterality: N/A;   COLONOSCOPY WITH PROPOFOL N/A 09/03/2022   Procedure: COLONOSCOPY WITH PROPOFOL;  Surgeon: [name], MD;  Location: [facility];  Service: Endoscopy;  Laterality: N/A;   ESOPHAGOGASTRODUODENOSCOPY (EGD) WITH PROPOFOL N/A 10/26/2019   Procedure: ESOPHAGOGASTRODUODENOSCOPY (EGD) WITH BIOPSY;  Surgeon: [name], MD;  Location: [facility];  Service: Endoscopy;  Laterality: N/A;  sleep apnea   LAPAROSCOPIC PARTIAL COLECTOMY Right 11/17/2019   Procedure: LAPAROSCOPIC PARTIAL COLECTOMY RIGHT EXTENDED;  Surgeon: [name], MD;  Location: [facility];  Service: General;  Laterality: Right;   PORTACATH  PLACEMENT N/A 12/28/2019   Procedure: INSERTION PORT-A-CATH Left subclavian;  Surgeon: [name], MD;  Location: [facility];  Service: General;  Laterality: N/A;       Home Medications    Prior to Admission medications   Medication Sig Start Date End Date Taking? Authorizing Provider  allopurinol (ZYLOPRIM) 100 MG tablet Take 100 mg by mouth daily. 11/01/22 11/01/23  [provider]  apixaban (ELIQUIS) 5 MG TABS tablet Take 5 mg by mouth 2 (two) times daily.    [provider]  APPLE CIDER VINEGAR PO Take 30-45 mLs by mouth every other day.     [provider]  atorvastatin (LIPITOR) 80 MG tablet Take 80 mg by mouth daily.    [provider]  colchicine 0.6 MG tablet Take 1 tablet (0.6 mg total) by mouth 2 (two) times daily. Until flare resolves. 04/22/23   Shirlee Latch, PA-C  digoxin (LANOXIN) 0.25 MG tablet Take 0.25 mg by mouth daily.  Patient not taking: Reported on 04/23/2023    [provider]  furosemide (LASIX) 80 MG tablet Take 40 mg by mouth daily. Morning & late afternoon    [provider]  MAGNESIUM PO Take by mouth.    [provider]  Misc Natural Products (TART CHERRY ADVANCED PO) Take 60 mLs by mouth every other day.    [provider]  Multiple Vitamins-Minerals (ZINC PO) Take by mouth.    [provider]  potassium bicarbonate (K-LYTE) 25 MEQ disintegrating tablet Take 25 mEq by mouth 4 (four) times a week. In the afternoon.    [provider]  prochlorperazine (COMPAZINE) 10 MG tablet Take 1 tablet (10 mg total) by mouth every 6 (six) hours as needed for nausea or vomiting. 04/23/23   [name], MD  tamsulosin (FLOMAX) 0.4 MG CAPS capsule Take 1 capsule (0.4 mg total) by mouth daily after supper. 05/21/23   Carmina Miller, MD  Zinc Sulfate (ZINC 15 PO) Take by mouth.    [provider]    Family History Family History  Problem Relation Age of Onset   Healthy Mother     Social History Social History   Tobacco Use   Smoking status: Never    Passive exposure: Never   Smokeless tobacco: Never  Vaping Use   Vaping status: Never Used  Substance Use Topics   Alcohol use: Not Currently   Drug use: Never     Allergies   Patient has no known allergies.   Review of Systems Review of Systems  Constitutional:  Negative for fever.  Musculoskeletal:  Positive for arthralgias. Negative for joint swelling.  Skin:  Negative for color  change.     Physical Exam Triage Vital Signs ED Triage Vitals  Encounter Vitals Group     BP 06/15/23 1343 116/73     Systolic BP Percentile --      Diastolic BP Percentile --      Pulse Rate 06/15/23 1343 60     Resp 06/15/23 1343 15     Temp 06/15/23 1343 98 F (36.7 C)     Temp Source 06/15/23 1343 Oral     SpO2 06/15/23 1343 95 %     Weight 06/15/23 1342 217 lb 13 oz (98.8 kg)     Height 06/15/23 1342 5\' 9"  (1.753 m)     Head Circumference --      Peak Flow --      Pain Score 06/15/23 1342 6  Pain Loc --      Pain Education --      Exclude from Growth Chart --    No data found.  Updated Vital Signs BP 116/73 (BP Location: Right Arm)   Pulse 60   Temp 98 F (36.7 C) (Oral)   Resp 15   Ht 5\' 9"  (1.753 m)   Wt 217 lb 13 oz (98.8 kg)   SpO2 95%   BMI 32.17 kg/m   Visual Acuity Right Eye Distance:   Left Eye Distance:   Bilateral Distance:    Right Eye Near:   Left Eye Near:    Bilateral Near:     Physical Exam Vitals and nursing note reviewed.  Constitutional:      Appearance: Normal appearance.  Musculoskeletal:        General: Tenderness present. No swelling or signs of injury.  Skin:    General: Skin is warm and dry.     Capillary Refill: Capillary refill takes less than 2 seconds.     Findings: No erythema.  Neurological:     General: No focal deficit present.     Mental Status: He is alert and oriented to person, place, and time.      UC Treatments / Results  Labs (all labs ordered are listed, but only abnormal results are displayed) Labs Reviewed - No data to display  EKG   Radiology No results found.  Procedures Procedures (including critical care time)  Medications Ordered in UC Medications  dexamethasone (DECADRON) injection 10 mg (has no administration in time range)    Initial Impression / Assessment and Plan / UC Course  I have reviewed the triage vital signs and the nursing notes.  Pertinent labs & imaging results  that were available during my care of the patient were reviewed by me and considered in my medical decision making (see chart for details).   Patient is a pleasant, nontoxic-appearing 67 year old male presenting for evaluation of gout flare in his left knee as outlined HPI above.  On exam patient's knee is in normal anatomical alignment.  He is complaining of pain in the middle of the knee but no pain with palpation of the medial lateral joint line, patella, or popliteal complex.  No appreciable edema or erythema noted.  The patient reports that this is his typical gout flare and then he typically responds to Decadron after he is taking his colchicine and allopurinol without any improvement.  I will order 10 mg of IM Decadron and have him follow-up with his PCP.   Final Clinical Impressions(s) / UC Diagnoses   Final diagnoses:  Acute gout of left knee, unspecified cause     Discharge Instructions      Continue taking your allopurinol and other medications as previously prescribed.  Continue to avoid triggers such as red meat, leafy green vegetables, or oily fish.  You may also continue take your colchicine as previously prescribed.  If your symptoms do not improve, or they worsen, I recommend that you either follow-up with your PCP or with [facility].     ED Prescriptions   None    PDMP not reviewed this encounter.   Becky Augusta, NP 06/15/23 [phone]

## 2023-06-17 ENCOUNTER — Encounter: Payer: Medicare HMO | Admitting: Speech Pathology

## 2023-06-17 ENCOUNTER — Ambulatory Visit: Payer: Medicare HMO | Admitting: Speech Pathology

## 2023-06-17 DIAGNOSIS — R4701 Aphasia: Secondary | ICD-10-CM | POA: Diagnosis present

## 2023-06-17 DIAGNOSIS — R41841 Cognitive communication deficit: Secondary | ICD-10-CM | POA: Diagnosis not present

## 2023-06-17 NOTE — Therapy (Signed)
OUTPATIENT SPEECH LANGUAGE PATHOLOGY APHASIA TREATMENT   Patient Name: Chris George MRN: [id number] DOB:May 22, 1956, 67 y.o., male Today's Date: 06/17/2023  PCP: Vallarie Mare REFERRING PROVIDER: Maryjane Hurter, MD   End of Session - 06/17/23 1036     Visit Number 12    Number of Visits 24    Date for SLP Re-Evaluation 07/11/23    SLP Start Time 0933    SLP Stop Time  1033    SLP Time Calculation (min) 60 min    Activity Tolerance Patient tolerated treatment well             Past Medical History:  Diagnosis Date   A-fib (HCC)    Anemia    Arthritis    ankles   Cancer (HCC)    multiple myeloma   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HCC) 10/26/2019   Cardiomyopathy (HCC)    Chemotherapy-induced peripheral neuropathy (HCC)    CHF (congestive heart failure) (HCC)    Depression    Dysrhythmia    atrial fib   HLD (hyperlipidemia)    Hypercholesteremia    Hypertension    Moderate tricuspid insufficiency    Multiple myeloma (HCC)    not being treated right now per pt 11/11/19   Pneumonia    Sleep apnea    No CPAP.  OSA resolved with wt loss.   Past Surgical History:  Procedure Laterality Date   ATRIAL ABLATION SURGERY     CARDIAC SURGERY     A-Fib Ablations   COLONOSCOPY WITH PROPOFOL N/A 10/26/2019   Procedure: COLONOSCOPY WITH BIOPSY;  Surgeon: [name], MD;  Location: [facility];  Service: Endoscopy;  Laterality: N/A;   COLONOSCOPY WITH PROPOFOL N/A 09/03/2022   Procedure: COLONOSCOPY WITH PROPOFOL;  Surgeon: [name], MD;  Location: [facility];  Service: Endoscopy;  Laterality: N/A;   ESOPHAGOGASTRODUODENOSCOPY (EGD) WITH PROPOFOL N/A 10/26/2019   Procedure: ESOPHAGOGASTRODUODENOSCOPY (EGD) WITH BIOPSY;  Surgeon: [name], MD;  Location: [facility];  Service: Endoscopy;  Laterality: N/A;  sleep apnea   LAPAROSCOPIC PARTIAL COLECTOMY Right 11/17/2019   Procedure: LAPAROSCOPIC PARTIAL COLECTOMY RIGHT EXTENDED;  Surgeon: [name], MD;   Location: [facility];  Service: General;  Laterality: Right;   PORTACATH PLACEMENT N/A 12/28/2019   Procedure: INSERTION PORT-A-CATH Left subclavian;  Surgeon: [name], MD;  Location: [facility];  Service: General;  Laterality: N/A;   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   Diabetes mellitus type 2, controlled, without complications (HCC) 08/07/2021   Neuropathy 08/30/2020   Port-A-Cath in place    Abnormal ECG 11/04/2019   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HCC) 10/27/2019   Goals of care, counseling/discussion 05/18/2019   Multiple myeloma (HCC) 05/17/2019   Anemia 05/12/2019   Arthritis of left ankle 02/05/2018   Cardiomyopathy, idiopathic (HCC) 01/07/2018   Moderate tricuspid insufficiency 01/01/2017   OSA (obstructive sleep apnea) 01/01/2017   Chronic a-fib (HCC) 12/06/2015   Pure hypercholesterolemia 02/09/2015   Anticoagulant long-term use 04/29/2014   Hyperlipidemia 04/29/2014   Hypertension, essential, benign 04/26/2014    ONSET DATE: 04/05/23  REFERRING DIAG: aphasia  THERAPY DIAG:  Cognitive communication deficit  Aphasia  Rationale for Evaluation and Treatment Rehabilitation  SUBJECTIVE:   SUBJECTIVE STATEMENT: Pt pleasant and cooperative. Apologetic for missed session last week, "I had my days mixed up."   Pt accompanied by: self  PERTINENT  HISTORY: Patient is a 67 y.o. 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 Pt with PMHx of ca of the transverse colon, multiple myeloma, lymphoma, DM, HTN, HLD, CKD, and recently diagnosed prostate cancer. Pt undergoing radiation tx for cancer and reports plan to possibly resume chemotherapy.   DIAGNOSTIC FINDINGS:  MRI brain 04/06/23, "MRI brain findings-Acute infarct in the left parietal lobe with small focus of infarct in  the right temporal lobe (5:41, 5:38) evident on diffusion-weighted imaging and FLAIR sequences. A few nodular foci of low signal on susceptibility weighted imaging in the region of left parietal infarct (for example 17:46) may represent small areas of microhemorrhage or cortical vessel thrombus.Mild cerebral volume loss with ex vacuo dilatation of the CSF-containing spaces. There are scattered in confluent foci of signal abnormality within the periventricular and deep white matter.  These are nonspecific but commonly seen with small vessel ischemic changes. There is no midline shift. No extra-axial fluid collection. No mass. There is no abnormal enhancement."  PAIN:  Are you having pain? No  FALLS: Has patient fallen in last 6 months?  No  LIVING ENVIRONMENT: Lives with: lives with their family since stroke; pt lived alone prior to stroke Lives in: House/apartment  PLOF:  Level of assistance: Independent with ADLs, Comment: prior to stroke; now reports needing assistance with iADLs  Employment: Retired   PATIENT GOALS    to be able to participate in complex conversation with family and friends; to understanding what he is reading; to improve QoL; to be more independent   OBJECTIVE:  TODAY'S TREATMENT:  Skilled SLP treatment provided targeting expressive communication. Pt endorsed completing drill work "several" times since last session. Endorsed having "good" conversations with church family at church, Bible Study, and a funeral this weekend. Pt reported successful communication; however, endorsed required extra time for sequencing sounds and for wordfinding. Pt reports using synonyms/antonyms and circumlocution to repair communication breakdowns. Pt expressed desire to use "specific words" to express himself. Use of compensatory strategies was evident in informal conversational exchanges this date. Pt able to tell story of the morning of his stroke with modified independence (extra time,  revisions, synonym use). Pt expressed continued frustration with text messaging and email writing. Discussed use of predictive text to aid in written communication. Unsuccessful attempt at setting up text-to-speech on pt's phone. Will attempt again next session.   Education completed re: communication strategies and progress to date. Pt verbalized understanding/agreement.     PATIENT EDUCATION: Education details: as above Person educated: Patient Education method: Explanation Education comprehension: verbalized understanding; needs further reinforcement  HOME EXERCISE PROGRAM:   Additional worksheets provided for home practice; pt to determine topic of email or text message he would like to compose in upcoming sessions    GOALS:  Goals reviewed with patient? Yes  SHORT TERM GOALS: Target date: 10 sessions; revised goal - additional 10 weeks  Patient will demonstrate improved expressive language skills during communication breakdowns by using multimodal means (semantic feature analysis, circumlocution, synonym/antonym use, gestures, writing, drawing) to repair with mod cues. Baseline: Goal status: MET  2.  With Moderate A, patient will complete a semantic feature analysis with at least 3-5 relevant features for 8/10 target words with cues to improve word-finding skills.  Baseline:  Goal status: MET  3.  With min A, patient will generate sentences with 5 or more words during structured task at 80% accuracy in order to increase ability to communicate basic wants and  needs.  Baseline:  Goal status: REVISED  4.  Pt will participate in further assessment of functional reading and writing to help determine level of assistance needed for language based iADL tasks. Baseline:  Goal status: MET  5.  Patient will follow two step commands 70% acc in context of a functional activity.  Baseline:  Goal status: MET  **New goals added 06/03/23; target date: 10 weeks   6. Pt will co-create  x2 scripts for improved participation in medical appointments and                  social events with moderate assistance.               7. Pt will create simple text messages to desired communication partners with min A.   LONG TERM GOALS: Target date: 12 weeks  Patient will demonstrate knowledge of appropriate activities to support functional receptive and expressive language outside of ST with assistance from family.  Baseline:  Goal status: IN PROGRESS  2.  Patient will report improved confidence in conversations outside of home (appointments, friends, on the phone) than prior to ST, as measured by Communication Effectiveness Survey.  Baseline:  15/32 Goal status: IN PROGRESS; will complete prior to last session  3.  Patient and/or family will report use of strategies outside of ST to improve communication (use of scripts, pre-planning, semantic feature analysis, supported conversation).  Baseline:  Goal status: IN PROGRESS  4.  Patient will demonstrate improved ability with describing events, storytelling, or sharing medical information through personalized strategies (requesting time, using SFA, drawing, etc) as measured by Communication Effectiveness Survey.  Baseline:  Goal status: IN PROGRESS   ASSESSMENT:  CLINICAL IMPRESSION: Patient is a 67 y.o. 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 Pt making good progress. 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 See details of tx session above.  I recommend continued course of skilled SLP services targeting functional receptive and expressive communication to improve pt's participation in iADLs, preferred activities, and QoL.   OBJECTIVE IMPAIRMENTS include expressive language, receptive language, and aphasia. These impairments are limiting patient from  managing medications, managing appointments, managing finances, household responsibilities, ADLs/IADLs, and effectively communicating at home and in community. Factors affecting potential to achieve goals and functional outcome are co-morbidities. Patient will benefit from skilled SLP services to address above impairments and improve overall function.  REHAB POTENTIAL: Excellent  PLAN: SLP FREQUENCY: 2x/week  SLP DURATION: 12 weeks  PLANNED INTERVENTIONS: Cueing hierachy, Internal/external aids, Functional tasks, Multimodal communication approach, SLP instruction and feedback, Compensatory strategies, and Patient/family education    Clyde Canterbury, M.S., CCC-SLP Speech-Language Pathologist  [facility] [address] [place], [place], [address] Phone: [phone]   Fax:  [phone]

## 2023-06-19 ENCOUNTER — Encounter: Payer: Self-pay | Admitting: Oncology

## 2023-06-19 ENCOUNTER — Inpatient Hospital Stay: Payer: Medicare HMO

## 2023-06-19 ENCOUNTER — Inpatient Hospital Stay (HOSPITAL_BASED_OUTPATIENT_CLINIC_OR_DEPARTMENT_OTHER): Payer: Medicare HMO | Admitting: Oncology

## 2023-06-19 VITALS — BP 99/66 | HR 62 | Temp 96.5°F | Resp 16 | Ht 69.0 in | Wt 204.0 lb

## 2023-06-19 DIAGNOSIS — C184 Malignant neoplasm of transverse colon: Secondary | ICD-10-CM

## 2023-06-19 DIAGNOSIS — Z5111 Encounter for antineoplastic chemotherapy: Secondary | ICD-10-CM | POA: Diagnosis not present

## 2023-06-19 LAB — CMP (CANCER CENTER ONLY)
ALT: 24 U/L (ref 0–44)
AST: 28 U/L (ref 15–41)
Albumin: 3.7 g/dL (ref 3.5–5.0)
Alkaline Phosphatase: 172 U/L — ABNORMAL HIGH (ref 38–126)
Anion gap: 7 (ref 5–15)
BUN: 47 mg/dL — ABNORMAL HIGH (ref 8–23)
CO2: 26 mmol/L (ref 22–32)
Calcium: 8.8 mg/dL — ABNORMAL LOW (ref 8.9–10.3)
Chloride: 101 mmol/L (ref 98–111)
Creatinine: 2.53 mg/dL — ABNORMAL HIGH (ref 0.61–1.24)
GFR, Estimated: 27 mL/min — ABNORMAL LOW (ref >60)
Glucose, Bld: 144 mg/dL — ABNORMAL HIGH (ref 70–99)
Potassium: 4.8 mmol/L (ref 3.5–5.1)
Sodium: 134 mmol/L — ABNORMAL LOW (ref 135–145)
Total Bilirubin: 0.7 mg/dL (ref 0.3–1.2)
Total Protein: 7.7 g/dL (ref 6.5–8.1)

## 2023-06-19 LAB — CBC WITH DIFFERENTIAL (CANCER CENTER ONLY)
Abs Immature Granulocytes: 0.34 10*3/uL — ABNORMAL HIGH (ref 0.00–0.07)
Basophils Absolute: 0 10*3/uL (ref 0.0–0.1)
Basophils Relative: 0 %
Eosinophils Absolute: 0 10*3/uL (ref 0.0–0.5)
Eosinophils Relative: 1 %
HCT: 28.6 % — ABNORMAL LOW (ref 39.0–52.0)
Hemoglobin: 9 g/dL — ABNORMAL LOW (ref 13.0–17.0)
Immature Granulocytes: 5 %
Lymphocytes Relative: 8 %
Lymphs Abs: 0.5 10*3/uL — ABNORMAL LOW (ref 0.7–4.0)
MCH: 28.1 pg (ref 26.0–34.0)
MCHC: 31.5 g/dL (ref 30.0–36.0)
MCV: 89.4 fL (ref 80.0–100.0)
Monocytes Absolute: 0.7 10*3/uL (ref 0.1–1.0)
Monocytes Relative: 10 %
Neutro Abs: 4.9 10*3/uL (ref 1.7–7.7)
Neutrophils Relative %: 76 %
Platelet Count: 101 10*3/uL — ABNORMAL LOW (ref 150–400)
RBC: 3.2 MIL/uL — ABNORMAL LOW (ref 4.22–5.81)
RDW: 17.6 % — ABNORMAL HIGH (ref 11.5–15.5)
WBC Count: 6.4 10*3/uL (ref 4.0–10.5)
nRBC: 0 % (ref 0.0–0.2)

## 2023-06-19 LAB — MAGNESIUM: Magnesium: 1.7 mg/dL (ref 1.7–2.4)

## 2023-06-19 MED ORDER — FLUOROURACIL CHEMO INJECTION 2.5 GM/50ML
360.0000 mg/m2 | Freq: Once | INTRAVENOUS | Status: AC
  Administered 2023-06-19: 800 mg via INTRAVENOUS
  Filled 2023-06-19: qty 16

## 2023-06-19 MED ORDER — SODIUM CHLORIDE 0.9 % IV SOLN
2160.0000 mg/m2 | INTRAVENOUS | Status: AC
  Administered 2023-06-19: 5000 mg via INTRAVENOUS
  Filled 2023-06-19: qty 100

## 2023-06-19 MED ORDER — SODIUM CHLORIDE 0.9% FLUSH
10.0000 mL | Freq: Once | INTRAVENOUS | Status: AC
  Administered 2023-06-19: 10 mL via INTRAVENOUS
  Filled 2023-06-19: qty 10

## 2023-06-19 MED ORDER — SODIUM CHLORIDE 0.9 % IV SOLN
360.0000 mg/m2 | Freq: Once | INTRAVENOUS | Status: AC
  Administered 2023-06-19: 782 mg via INTRAVENOUS
  Filled 2023-06-19: qty 39.1

## 2023-06-19 MED ORDER — SODIUM CHLORIDE 0.9 % IV SOLN
Freq: Once | INTRAVENOUS | Status: AC
  Filled 2023-06-19: qty 250

## 2023-06-19 MED ORDER — ATROPINE SULFATE 1 MG/ML IV SOLN
0.5000 mg | Freq: Once | INTRAVENOUS | Status: AC | PRN
  Administered 2023-06-19: 0.5 mg via INTRAVENOUS
  Filled 2023-06-19: qty 1

## 2023-06-19 MED ORDER — SODIUM CHLORIDE 0.9 % IV SOLN
160.0000 mg/m2 | Freq: Once | INTRAVENOUS | Status: AC
  Administered 2023-06-19: 340 mg via INTRAVENOUS
  Filled 2023-06-19: qty 17

## 2023-06-19 MED ORDER — PALONOSETRON HCL INJECTION 0.25 MG/5ML
0.2500 mg | Freq: Once | INTRAVENOUS | Status: AC
  Administered 2023-06-19: 0.25 mg via INTRAVENOUS
  Filled 2023-06-19: qty 5

## 2023-06-19 MED ORDER — SODIUM CHLORIDE 0.9 % IV SOLN
10.0000 mg | Freq: Once | INTRAVENOUS | Status: AC
  Administered 2023-06-19: 10 mg via INTRAVENOUS
  Filled 2023-06-19: qty 10

## 2023-06-19 MED ORDER — HEPARIN SOD (PORK) LOCK FLUSH 100 UNIT/ML IV SOLN
500.0000 [IU] | Freq: Once | INTRAVENOUS | Status: AC
  Filled 2023-06-19: qty 5

## 2023-06-19 NOTE — Progress Notes (Signed)
[facility]  Telephone:([phone] [phone]  ID: Chris George OB: 07-01-1956  MR#: [id number]  AOZ#:[id number]  Patient Care Team: Marina Goodell, MD as PCP - General (Family Medicine) [name], MD as Consulting Physician (Oncology) Morene Crocker, MD as Referring Physician (Neurology) Lamar Blinks, MD as Consulting Physician (Cardiology)  CHIEF COMPLAINT: Recurrent colon cancer, smoldering myeloma, prostate cancer.  INTERVAL HISTORY: Patient returns to clinic today for further evaluation and consideration of cycle 3 of FOLFIRI.  He continues to have chronic weakness and fatigue, but otherwise feels well. He continues to have a mild expressive aphasia, but no other neurologic complaints. He denies any recent fevers or illnesses.  He has a good appetite and denies weight loss.  He has no chest pain, shortness of breath, cough, or hemoptysis.  He denies any nausea, vomiting, constipation, or diarrhea.  He has no melena or hematochezia.  He has no urinary complaints.  Patient offers no further specific complaints today.  REVIEW OF SYSTEMS:   Review of Systems  Constitutional:  Positive for malaise/fatigue. Negative for fever and weight loss.  Respiratory: Negative.  Negative for cough, hemoptysis and shortness of breath.   Cardiovascular: Negative.  Negative for chest pain and leg swelling.  Gastrointestinal: Negative.  Negative for blood in stool, diarrhea and melena.  Genitourinary: Negative.  Negative for hematuria.  Musculoskeletal: Negative.  Negative for back pain and joint pain.  Skin: Negative.  Negative for rash.  Neurological:  Positive for tingling, sensory change and weakness. Negative for dizziness, focal weakness and headaches.  Psychiatric/Behavioral: Negative.  Negative for depression. The patient is not nervous/anxious.     As per HPI. Otherwise, a complete review of systems is negative.  PAST MEDICAL HISTORY: Past  Medical History:  Diagnosis Date   A-fib (HCC)    Anemia    Arthritis    ankles   Cancer (HCC)    multiple myeloma   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HCC) 10/26/2019   Cardiomyopathy (HCC)    Chemotherapy-induced peripheral neuropathy (HCC)    CHF (congestive heart failure) (HCC)    Depression    Dysrhythmia    atrial fib   HLD (hyperlipidemia)    Hypercholesteremia    Hypertension    Moderate tricuspid insufficiency    Multiple myeloma (HCC)    not being treated right now per pt 11/11/19   Pneumonia    Sleep apnea    No CPAP.  OSA resolved with wt loss.    PAST SURGICAL HISTORY: Past Surgical History:  Procedure Laterality Date   ATRIAL ABLATION SURGERY     CARDIAC SURGERY     A-Fib Ablations   COLONOSCOPY WITH PROPOFOL N/A 10/26/2019   Procedure: COLONOSCOPY WITH BIOPSY;  Surgeon: [name], MD;  Location: [facility];  Service: Endoscopy;  Laterality: N/A;   COLONOSCOPY WITH PROPOFOL N/A 09/03/2022   Procedure: COLONOSCOPY WITH PROPOFOL;  Surgeon: [name], MD;  Location: [facility];  Service: Endoscopy;  Laterality: N/A;   ESOPHAGOGASTRODUODENOSCOPY (EGD) WITH PROPOFOL N/A 10/26/2019   Procedure: ESOPHAGOGASTRODUODENOSCOPY (EGD) WITH BIOPSY;  Surgeon: [name], MD;  Location: [facility];  Service: Endoscopy;  Laterality: N/A;  sleep apnea   LAPAROSCOPIC PARTIAL COLECTOMY Right 11/17/2019   Procedure: LAPAROSCOPIC PARTIAL COLECTOMY RIGHT EXTENDED;  Surgeon: [name], MD;  Location: [facility];  Service: General;  Laterality: Right;   PORTACATH PLACEMENT N/A 12/28/2019   Procedure: INSERTION PORT-A-CATH Left subclavian;  Surgeon: [name], MD;  Location:  [facility];  Service: General;  Laterality: N/A;    FAMILY HISTORY: Family History  Problem Relation Age of Onset   Healthy Mother     ADVANCED DIRECTIVES (Y/N):  N  HEALTH MAINTENANCE: Social History   Tobacco Use   Smoking status: Never    Passive exposure: Never    Smokeless tobacco: Never  Vaping Use   Vaping status: Never Used  Substance Use Topics   Alcohol use: Not Currently   Drug use: Never     Colonoscopy:  PAP:  Bone density:  Lipid panel:  No Known Allergies  Current Outpatient Medications  Medication Sig Dispense Refill   allopurinol (ZYLOPRIM) 100 MG tablet Take 100 mg by mouth daily.     apixaban (ELIQUIS) 5 MG TABS tablet Take 5 mg by mouth 2 (two) times daily.     APPLE CIDER VINEGAR PO Take 30-45 mLs by mouth every other day.      atorvastatin (LIPITOR) 80 MG tablet Take 80 mg by mouth daily.     colchicine 0.6 MG tablet Take 1 tablet (0.6 mg total) by mouth 2 (two) times daily. Until flare resolves. 20 tablet 1   furosemide (LASIX) 80 MG tablet Take 40 mg by mouth daily. Morning & late afternoon     MAGNESIUM PO Take by mouth.     Misc Natural Products (TART CHERRY ADVANCED PO) Take 60 mLs by mouth every other day.     Multiple Vitamins-Minerals (ZINC PO) Take by mouth.     potassium bicarbonate (K-LYTE) 25 MEQ disintegrating tablet Take 25 mEq by mouth 4 (four) times a week. In the afternoon.     prochlorperazine (COMPAZINE) 10 MG tablet Take 1 tablet (10 mg total) by mouth every 6 (six) hours as needed for nausea or vomiting. 60 tablet 1   tamsulosin (FLOMAX) 0.4 MG CAPS capsule Take 1 capsule (0.4 mg total) by mouth daily after supper. 30 capsule 6   Zinc Sulfate (ZINC 15 PO) Take by mouth.     digoxin (LANOXIN) 0.25 MG tablet Take 0.25 mg by mouth daily.  (Patient not taking: Reported on 04/23/2023)     No current facility-administered medications for this visit.   Facility-Administered Medications Ordered in Other Visits  Medication Dose Route Frequency Provider Last Rate Last Admin   fluorouracil (ADRUCIL) 5,000 mg in sodium chloride 0.9 % 150 mL chemo infusion  2,160 mg/m2 (Treatment Plan Recorded) Intravenous 1 day or 1 dose [name], MD   Infusion Verify at 06/19/23 1317   heparin lock flush 100 unit/mL   500 Units Intravenous Once [name], MD        OBJECTIVE: Vitals:   06/19/23 0837  BP: 99/66  Pulse: 62  Resp: 16  Temp: (!) 96.5 F (35.8 C)  SpO2: 100%      Body mass index is 30.13 kg/m.    ECOG FS:0 - Asymptomatic  General: Well-developed, well-nourished, no acute distress. Eyes: Pink conjunctiva, anicteric sclera. HEENT: Normocephalic, moist mucous membranes. Lungs: No audible wheezing or coughing. Heart: Regular rate and rhythm. Abdomen: Soft, nontender, no obvious distention. Musculoskeletal: No edema, cyanosis, or clubbing. Neuro: Alert, answering all questions appropriately. Cranial nerves grossly intact. Skin: No rashes or petechiae noted. Psych: Normal affect.  LAB RESULTS:  Lab Results  Component Value Date   NA 134 (L) 06/19/2023   K 4.8 06/19/2023   CL 101 06/19/2023   CO2 26 06/19/2023   GLUCOSE 144 (H) 06/19/2023   BUN 47 (H) 06/19/2023  CREATININE 2.53 (H) 06/19/2023   CALCIUM 8.8 (L) 06/19/2023   PROT 7.7 06/19/2023   ALBUMIN 3.7 06/19/2023   AST 28 06/19/2023   ALT 24 06/19/2023   ALKPHOS 172 (H) 06/19/2023   BILITOT 0.7 06/19/2023   GFRNONAA 27 (L) 06/19/2023   GFRAA >60 07/29/2020    Lab Results  Component Value Date   WBC 6.4 06/19/2023   NEUTROABS 4.9 06/19/2023   HGB 9.0 (L) 06/19/2023   HCT 28.6 (L) 06/19/2023   MCV 89.4 06/19/2023   PLT 101 (L) 06/19/2023   Lab Results  Component Value Date   IRON 41 (L) 05/11/2020   TIBC 323 05/11/2020   IRONPCTSAT 13 (L) 05/11/2020   Lab Results  Component Value Date   FERRITIN 86 05/11/2020     STUDIES: No results found.   ASSESSMENT: Stage IIIc colon cancer, smoldering myeloma, prostate cancer.  PLAN:    Stage IIIc colon cancer: Patient completed cycle 9 of adjuvant FOLFOX on June 03, 2020.  Given his difficulties with treatment and declining performance status, treatment was discontinued altogether. Colonoscopy on September 03, 2022 did not reveal any significant  pathology and recommendation was to repeat in 3 years.  CT scan from December 07, 2022 reviewed independently with enlarging aortocaval node now measuring 1.9 x 1.4 cm.  PET scan results from Mar 15, 2023 reviewed independently with progressive retroperitoneal lymphadenopathy presumably related to patient's colon cancer.  His CEA also continues to trend up and his most recent result was 194.  Proceed with cycle 3 of treatment today.  Treatment was previously dose reduced 10% given his renal insufficiency and baseline pancytopenia.  Will hold Avastin at this time given his recent stroke.  Return to clinic in 2 days for pump removal and Udenyca.  Patient with a return to clinic in 2 weeks for further evaluation and consideration of cycle 4.  Plan to reimage at the conclusion of cycle 6. Prostate cancer: Gleason's 3+4.  Patient's PSA is increased, but relatively stable at 32.4.  Imaging as above.  Nuclear medicine bone scan on December 13, 2022 was reported as negative.  PSMA PET per radiation oncology from Mar 21, 2023 did not reveal metastatic disease.  Patient completed XRT for local control disease on May 30, 2023.  Smoldering myeloma: Chronic and unchanged.  Patient was initially diagnosed and treated in [place], [place] and treated with single agent Velcade in approximately 2013.  He declined maintenance treatment or referral for bone marrow transplant.  His M spike has ranged from 1.1-2.0 since July 2020.  His most recent result was 1.6.  Over the same timeframe his IgG component has ranged from [phone].  His most recent result is 2344.  Finally, his kappa free light chains have ranged from 24.8 -75.3 with his most recent result reported at 36.2.  Nuclear med bone scan as above.  His most recent bone marrow biopsy completed on May 28, 2019 revealed only 10 to 15% plasma cells with no clonality reported.  Cytogenetics were also reported as normal.  Continue to monitor closely.  Anemia: Hemoglobin improved  to 9.0 with 1 unit packed red blood cells. Neutropenia: Resolved.  Continue Udenyca with pump removal.  Dose reduced FOLFIRI as above. Thrombocytopenia: Platelets have trended down to 101.  Proceed with treatment as above.  May have to consider treating every 3 weeks. Renal insufficiency: Creatinine has increased to 2.53.  Neither irinotecan or 5-FU need to be dose reduced in the setting of renal insufficiency.  Patient reports he has not seen nephrology in several years.  If his creatinine continues to remain elevated, will give a referral back. Peripheral neuropathy: Chronic and unchanged.  Patient reports gabapentin did not help.  He was recently initiated on Lyrica 50 mg daily and given a referral to neurology.   Hypotension: Mild, monitor.  Patient expressed understanding and was in agreement with this plan. He also understands that He can call clinic at any time with any questions, concerns, or complaints.    [name], MD   06/19/2023 2:32 PM

## 2023-06-19 NOTE — Patient Instructions (Signed)
[facility] REG[facility]  Discharge Instructions: Thank you for choosing [facility] to provide your oncology and hematology care.  If you have a lab appointment with the Cancer Center, please go directly to the Cancer Center and check in at the registration area.  Wear comfortable clothing and clothing appropriate for easy access to any Portacath or PICC line.   We strive to give you quality time with your provider. You may need to reschedule your appointment if you arrive late (15 or more minutes).  Arriving late affects you and other patients whose appointments are after yours.  Also, if you miss three or more appointments without notifying the office, you may be dismissed from the clinic at the provider's discretion.      For prescription refill requests, have your pharmacy contact our office and allow 72 hours for refills to be completed.    Today you received the following chemotherapy and/or immunotherapy agents Irinotecan, Leucovorin, &Adrucil      To help prevent nausea and vomiting after your treatment, we encourage you to take your nausea medication as directed.  BELOW ARE SYMPTOMS THAT SHOULD BE REPORTED IMMEDIATELY: *FEVER GREATER THAN 100.4 F (38 C) OR HIGHER *CHILLS OR SWEATING *NAUSEA AND VOMITING THAT IS NOT CONTROLLED WITH YOUR NAUSEA MEDICATION *UNUSUAL SHORTNESS OF BREATH *UNUSUAL BRUISING OR BLEEDING *URINARY PROBLEMS (pain or burning when urinating, or frequent urination) *BOWEL PROBLEMS (unusual diarrhea, constipation, pain near the anus) TENDERNESS IN MOUTH AND THROAT WITH OR WITHOUT PRESENCE OF ULCERS (sore throat, sores in mouth, or a toothache) UNUSUAL RASH, SWELLING OR PAIN  UNUSUAL VAGINAL DISCHARGE OR ITCHING   Items with * indicate a potential emergency and should be followed up as soon as possible or go to the Emergency Department if any problems should occur.  Please show the CHEMOTHERAPY ALERT CARD or IMMUNOTHERAPY  ALERT CARD at check-in to the Emergency Department and triage nurse.  Should you have questions after your visit or need to cancel or reschedule your appointment, please contact [facility]  [phone] and follow the prompts.  Office hours are 8:00 a.m. to 4:30 p.m. Monday - Friday. Please note that voicemails left after 4:00 p.m. may not be returned until the following business day.  We are closed weekends and major holidays. You have access to a nurse at all times for urgent questions. Please call the main number to the clinic [phone] and follow the prompts.  For any non-urgent questions, you may also contact your provider using MyChart. We now offer e-Visits for anyone 65 and older to request care online for non-urgent symptoms. For details visit [email or web address].   Also download the MyChart app! Go to the app store, search "MyChart", open the app, select [place], and log in with your MyChart username and password.

## 2023-06-20 ENCOUNTER — Encounter: Payer: Medicare HMO | Admitting: Speech Pathology

## 2023-06-20 ENCOUNTER — Ambulatory Visit: Payer: Medicare HMO | Admitting: Speech Pathology

## 2023-06-20 ENCOUNTER — Encounter: Payer: Self-pay | Admitting: Oncology

## 2023-06-20 DIAGNOSIS — R4701 Aphasia: Secondary | ICD-10-CM

## 2023-06-20 DIAGNOSIS — R41841 Cognitive communication deficit: Secondary | ICD-10-CM

## 2023-06-20 NOTE — Therapy (Signed)
OUTPATIENT SPEECH LANGUAGE PATHOLOGY APHASIA TREATMENT   Patient Name: Chris [facility] MRN: [id number] DOB:12/04/1955, 67 y.o., male Today's Date: 06/20/2023  PCP: Vallarie Mare REFERRING PROVIDER: Maryjane Hurter, MD   End of Session - 06/20/23 1032     Visit Number 13    Number of Visits 24    Date for SLP Re-Evaluation 07/11/23    SLP Start Time 0930    SLP Stop Time  1030    SLP Time Calculation (min) 60 min    Activity Tolerance Patient tolerated treatment well             Past Medical History:  Diagnosis Date   A-fib (HCC)    Anemia    Arthritis    ankles   Cancer (HCC)    multiple myeloma   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HCC) 10/26/2019   Cardiomyopathy (HCC)    Chemotherapy-induced peripheral neuropathy (HCC)    CHF (congestive heart failure) (HCC)    Depression    Dysrhythmia    atrial fib   HLD (hyperlipidemia)    Hypercholesteremia    Hypertension    Moderate tricuspid insufficiency    Multiple myeloma (HCC)    not being treated right now per pt 11/11/19   Pneumonia    Sleep apnea    No CPAP.  OSA resolved with wt loss.   Past Surgical History:  Procedure Laterality Date   ATRIAL ABLATION SURGERY     CARDIAC SURGERY     A-Fib Ablations   COLONOSCOPY WITH PROPOFOL N/A 10/26/2019   Procedure: COLONOSCOPY WITH BIOPSY;  Surgeon: [name], MD;  Location: [facility];  Service: Endoscopy;  Laterality: N/A;   COLONOSCOPY WITH PROPOFOL N/A 09/03/2022   Procedure: COLONOSCOPY WITH PROPOFOL;  Surgeon: [name], MD;  Location: [facility];  Service: Endoscopy;  Laterality: N/A;   ESOPHAGOGASTRODUODENOSCOPY (EGD) WITH PROPOFOL N/A 10/26/2019   Procedure: ESOPHAGOGASTRODUODENOSCOPY (EGD) WITH BIOPSY;  Surgeon: [name], MD;  Location: [facility];  Service: Endoscopy;  Laterality: N/A;  sleep apnea   LAPAROSCOPIC PARTIAL COLECTOMY Right 11/17/2019   Procedure: LAPAROSCOPIC PARTIAL COLECTOMY RIGHT EXTENDED;  Surgeon: [name], MD;   Location: [facility] OR[facility];  Service: General;  Laterality: Right;   PORTACATH PLACEMENT N/A 12/28/2019   Procedure: INSERTION PORT-A-CATH Left subclavian;  Surgeon: [name], MD;  Location: [facility];  Service: General;  Laterality: N/A;   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   Diabetes mellitus type 2, controlled, without complications (HCC) 08/07/2021   Neuropathy 08/30/2020   Port-A-Cath in place    Abnormal ECG 11/04/2019   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HCC) 10/27/2019   Goals of care, counseling/discussion 05/18/2019   Multiple myeloma (HCC) 05/17/2019   Anemia 05/12/2019   Arthritis of left ankle 02/05/2018   Cardiomyopathy, idiopathic (HCC) 01/07/2018   Moderate tricuspid insufficiency 01/01/2017   OSA (obstructive sleep apnea) 01/01/2017   Chronic a-fib (HCC) 12/06/2015   Pure hypercholesterolemia 02/09/2015   Anticoagulant long-term use 04/29/2014   Hyperlipidemia 04/29/2014   Hypertension, essential, benign 04/26/2014    ONSET DATE: 04/05/23  REFERRING DIAG: aphasia  THERAPY DIAG:  Cognitive communication deficit  Aphasia  Rationale for Evaluation and Treatment Rehabilitation  SUBJECTIVE:   SUBJECTIVE STATEMENT: Pt pleasant and cooperative. "I'm feeling better than yesterday." Endorsed "rough day" from chemo yesterday.  Pt accompanied by: self  PERTINENT HISTORY: Patient  is a 67 y.o. 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 Pt with PMHx of ca of the transverse colon, multiple myeloma, lymphoma, DM, HTN, HLD, CKD, and recently diagnosed prostate cancer. Pt undergoing radiation tx for cancer and reports plan to possibly resume chemotherapy.   DIAGNOSTIC FINDINGS:  MRI brain 04/06/23, "MRI brain findings-Acute infarct in the left parietal lobe with small focus  of infarct in the right temporal lobe (5:41, 5:38) evident on diffusion-weighted imaging and FLAIR sequences. A few nodular foci of low signal on susceptibility weighted imaging in the region of left parietal infarct (for example 17:46) may represent small areas of microhemorrhage or cortical vessel thrombus.Mild cerebral volume loss with ex vacuo dilatation of the CSF-containing spaces. There are scattered in confluent foci of signal abnormality within the periventricular and deep white matter.  These are nonspecific but commonly seen with small vessel ischemic changes. There is no midline shift. No extra-axial fluid collection. No mass. There is no abnormal enhancement."  PAIN:  Are you having pain? No  FALLS: Has patient fallen in last 6 months?  No  LIVING ENVIRONMENT: Lives with: lives with their family since stroke; pt lived alone prior to stroke Lives in: House/apartment  PLOF:  Level of assistance: Independent with ADLs, Comment: prior to stroke; now reports needing assistance with iADLs  Employment: Retired   PATIENT GOALS    to be able to participate in complex conversation with family and friends; to understanding what he is reading; to improve QoL; to be more independent   OBJECTIVE:  TODAY'S TREATMENT:  Skilled SLP treatment provided targeting expressive communication. Pt endorsed completing drill work "several" times since last session. Endorsed having "good" long time friend via telephone last week. Pt reported successful communication; however, endorsed required extra time for sequencing sounds and for wordfinding. Pt reports using synonyms/antonyms and circumlocution to repair communication breakdowns. Reviewed use of predictive text, text-to-speech, and spelling "out loud" for improve written communication via text and email. Pt able to use predictive text and spelling "out loud" with min A to compose simple text messages. Pt planning to purchase new phone and have cell  saleperson set up additional accessibility options (e.g. text-to-speech).   Education completed re: communication strategies and progress to date. Pt verbalized understanding/agreement.     PATIENT EDUCATION: Education details: as above Person educated: Patient Education method: Explanation Education comprehension: verbalized understanding; needs further reinforcement  HOME EXERCISE PROGRAM:   Additional worksheets provided for home practice    GOALS:  Goals reviewed with patient? Yes  SHORT TERM GOALS: Target date: 10 sessions; revised goal - additional 10 weeks  Patient will demonstrate improved expressive language skills during communication breakdowns by using multimodal means (semantic feature analysis, circumlocution, synonym/antonym use, gestures, writing, drawing) to repair with mod cues. Baseline: Goal status: MET  2.  With Moderate A, patient will complete a semantic feature analysis with at least 3-5 relevant features for 8/10 target words with cues to improve word-finding skills.  Baseline:  Goal status: MET  3.  With min A, patient will generate sentences with 5 or more words during structured task at 80% accuracy in order to increase ability to communicate basic wants and needs.  Baseline:  Goal status: REVISED  4.  Pt will participate in further assessment of functional reading and writing to help determine level of assistance needed for language based iADL tasks. Baseline:  Goal status: MET  5.  Patient will follow two step commands  70% acc in context of a functional activity.  Baseline:  Goal status: MET  **New goals added 06/03/23; target date: 10 weeks   6. Pt will co-create x2 scripts for improved participation in medical appointments and                  social events with moderate assistance.               7. Pt will create simple text messages to desired communication partners with min A.   LONG TERM GOALS: Target date: 12 weeks  Patient will  demonstrate knowledge of appropriate activities to support functional receptive and expressive language outside of ST with assistance from family.  Baseline:  Goal status: IN PROGRESS  2.  Patient will report improved confidence in conversations outside of home (appointments, friends, on the phone) than prior to ST, as measured by Communication Effectiveness Survey.  Baseline:  15/32 Goal status: IN PROGRESS; will complete prior to last session  3.  Patient and/or family will report use of strategies outside of ST to improve communication (use of scripts, pre-planning, semantic feature analysis, supported conversation).  Baseline:  Goal status: IN PROGRESS  4.  Patient will demonstrate improved ability with describing events, storytelling, or sharing medical information through personalized strategies (requesting time, using SFA, drawing, etc) as measured by Communication Effectiveness Survey.  Baseline:  Goal status: IN PROGRESS   ASSESSMENT:  CLINICAL IMPRESSION: Patient is a 67 y.o. 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 Pt making good progress. 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 See details of tx session above.  I recommend continued course of skilled SLP services targeting functional receptive and expressive communication to improve pt's participation in iADLs, preferred activities, and QoL.   OBJECTIVE IMPAIRMENTS include expressive language, receptive language, and aphasia. These impairments are limiting patient from managing medications, managing appointments, managing finances, household responsibilities, ADLs/IADLs, and effectively communicating at home and in community. Factors affecting potential to achieve goals and functional outcome are co-morbidities. Patient will benefit from skilled SLP  services to address above impairments and improve overall function.  REHAB POTENTIAL: Excellent  PLAN: SLP FREQUENCY: 2x/week  SLP DURATION: 12 weeks  PLANNED INTERVENTIONS: Cueing hierachy, Internal/external aids, Functional tasks, Multimodal communication approach, SLP instruction and feedback, Compensatory strategies, and Patient/family education    Clyde Canterbury, M.S., CCC-SLP Speech-Language Pathologist  [facility] at [facility] [address] [place], [place], [address] Phone: [phone]   Fax:  [phone]

## 2023-06-21 ENCOUNTER — Inpatient Hospital Stay: Payer: Medicare HMO

## 2023-06-21 VITALS — BP 98/62 | HR 71 | Temp 97.0°F | Resp 19

## 2023-06-21 DIAGNOSIS — Z5111 Encounter for antineoplastic chemotherapy: Secondary | ICD-10-CM | POA: Diagnosis not present

## 2023-06-21 DIAGNOSIS — C184 Malignant neoplasm of transverse colon: Secondary | ICD-10-CM

## 2023-06-21 MED ORDER — HEPARIN SOD (PORK) LOCK FLUSH 100 UNIT/ML IV SOLN
500.0000 [IU] | Freq: Once | INTRAVENOUS | Status: AC | PRN
  Administered 2023-06-21: 500 [IU]
  Filled 2023-06-21: qty 5

## 2023-06-21 MED ORDER — SODIUM CHLORIDE 0.9% FLUSH
10.0000 mL | INTRAVENOUS | Status: DC | PRN
  Administered 2023-06-21: 10 mL
  Filled 2023-06-21: qty 10

## 2023-06-21 MED ORDER — PEGFILGRASTIM-JMDB 6 MG/0.6ML ~~LOC~~ SOSY
6.0000 mg | PREFILLED_SYRINGE | Freq: Once | SUBCUTANEOUS | Status: AC
  Administered 2023-06-21: 6 mg via SUBCUTANEOUS

## 2023-06-22 ENCOUNTER — Other Ambulatory Visit: Payer: Self-pay

## 2023-06-24 ENCOUNTER — Encounter: Payer: Medicare HMO | Admitting: Speech Pathology

## 2023-06-24 ENCOUNTER — Ambulatory Visit: Payer: Medicare HMO | Admitting: Speech Pathology

## 2023-06-24 DIAGNOSIS — R4701 Aphasia: Secondary | ICD-10-CM

## 2023-06-24 DIAGNOSIS — R41841 Cognitive communication deficit: Secondary | ICD-10-CM

## 2023-06-24 NOTE — Therapy (Signed)
OUTPATIENT SPEECH LANGUAGE PATHOLOGY APHASIA TREATMENT   Patient Name: Chris [facility] MRN: [id number] DOB:March 03, 1956, 67 y.o., male Today's Date: 06/24/2023  PCP: Vallarie Mare REFERRING PROVIDER: Maryjane Hurter, MD   End of Session - 06/24/23 1026     Visit Number 14    Number of Visits 24    Date for SLP Re-Evaluation 07/11/23    SLP Start Time 0932    SLP Stop Time  1025    SLP Time Calculation (min) 53 min    Activity Tolerance Patient limited by fatigue             Past Medical History:  Diagnosis Date   A-fib (HCC)    Anemia    Arthritis    ankles   Cancer (HCC)    multiple myeloma   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HCC) 10/26/2019   Cardiomyopathy (HCC)    Chemotherapy-induced peripheral neuropathy (HCC)    CHF (congestive heart failure) (HCC)    Depression    Dysrhythmia    atrial fib   HLD (hyperlipidemia)    Hypercholesteremia    Hypertension    Moderate tricuspid insufficiency    Multiple myeloma (HCC)    not being treated right now per pt 11/11/19   Pneumonia    Sleep apnea    No CPAP.  OSA resolved with wt loss.   Past Surgical History:  Procedure Laterality Date   ATRIAL ABLATION SURGERY     CARDIAC SURGERY     A-Fib Ablations   COLONOSCOPY WITH PROPOFOL N/A 10/26/2019   Procedure: COLONOSCOPY WITH BIOPSY;  Surgeon: [name], MD;  Location: [facility];  Service: Endoscopy;  Laterality: N/A;   COLONOSCOPY WITH PROPOFOL N/A 09/03/2022   Procedure: COLONOSCOPY WITH PROPOFOL;  Surgeon: [name], MD;  Location: [facility];  Service: Endoscopy;  Laterality: N/A;   ESOPHAGOGASTRODUODENOSCOPY (EGD) WITH PROPOFOL N/A 10/26/2019   Procedure: ESOPHAGOGASTRODUODENOSCOPY (EGD) WITH BIOPSY;  Surgeon: [name], MD;  Location: [facility];  Service: Endoscopy;  Laterality: N/A;  sleep apnea   LAPAROSCOPIC PARTIAL COLECTOMY Right 11/17/2019   Procedure: LAPAROSCOPIC PARTIAL COLECTOMY RIGHT EXTENDED;  Surgeon: [name], MD;   Location: [facility] OR[facility];  Service: General;  Laterality: Right;   PORTACATH PLACEMENT N/A 12/28/2019   Procedure: INSERTION PORT-A-CATH Left subclavian;  Surgeon: [name], MD;  Location: [facility] OR[facility];  Service: General;  Laterality: N/A;   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   Diabetes mellitus type 2, controlled, without complications (HCC) 08/07/2021   Neuropathy 08/30/2020   Port-A-Cath in place    Abnormal ECG 11/04/2019   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HCC) 10/27/2019   Goals of care, counseling/discussion 05/18/2019   Multiple myeloma (HCC) 05/17/2019   Anemia 05/12/2019   Arthritis of left ankle 02/05/2018   Cardiomyopathy, idiopathic (HCC) 01/07/2018   Moderate tricuspid insufficiency 01/01/2017   OSA (obstructive sleep apnea) 01/01/2017   Chronic a-fib (HCC) 12/06/2015   Pure hypercholesterolemia 02/09/2015   Anticoagulant long-term use 04/29/2014   Hyperlipidemia 04/29/2014   Hypertension, essential, benign 04/26/2014    ONSET DATE: 04/05/23  REFERRING DIAG: aphasia  THERAPY DIAG:  Cognitive communication deficit  Aphasia  Rationale for Evaluation and Treatment Rehabilitation  SUBJECTIVE:   SUBJECTIVE STATEMENT: Appeared lethargic, often holding head down. "Not good." "Tired." "I haven't been out of the house since Friday."  Pt accompanied by: self  PERTINENT HISTORY: Patient is a 67 y.o. 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 Pt with PMHx of ca of the transverse colon, multiple myeloma, lymphoma, DM, HTN, HLD, CKD, and recently diagnosed prostate cancer. Pt undergoing radiation tx for cancer and reports plan to possibly resume chemotherapy.   DIAGNOSTIC FINDINGS:  MRI brain 04/06/23, "MRI brain findings-Acute infarct in the left parietal lobe with small  focus of infarct in the right temporal lobe (5:41, 5:38) evident on diffusion-weighted imaging and FLAIR sequences. A few nodular foci of low signal on susceptibility weighted imaging in the region of left parietal infarct (for example 17:46) may represent small areas of microhemorrhage or cortical vessel thrombus.Mild cerebral volume loss with ex vacuo dilatation of the CSF-containing spaces. There are scattered in confluent foci of signal abnormality within the periventricular and deep white matter.  These are nonspecific but commonly seen with small vessel ischemic changes. There is no midline shift. No extra-axial fluid collection. No mass. There is no abnormal enhancement."  PAIN:  Are you having pain? No  FALLS: Has patient fallen in last 6 months?  No  LIVING ENVIRONMENT: Lives with: lives with their family since stroke; pt lived alone prior to stroke Lives in: House/apartment  PLOF:  Level of assistance: Independent with ADLs, Comment: prior to stroke; now reports needing assistance with iADLs  Employment: Retired   PATIENT GOALS    to be able to participate in complex conversation with family and friends; to understanding what he is reading; to improve QoL; to be more independent   OBJECTIVE:  TODAY'S TREATMENT:  Skilled SLP treatment provided targeting expressive communication. Pt endorsed general malaise since Friday. Pt concerned about his response to chemo. Pt stating, "the last 45 days have been a lot between chemo, radiation, and the stroke." Supportive counseling provided re: current speech/language deficits, communication strategies, and progress to date. Pt appreciative of SLP for listening. 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 Revisions and hesitates noted with successful repair of communication breakdowns for either anomia or apraxia of speech ~85% of the time.      PATIENT  EDUCATION: Education details: as above Person educated: Patient Education method: Explanation Education comprehension: verbalized understanding; needs further reinforcement  HOME EXERCISE PROGRAM:   Additional worksheets provided for home practice    GOALS:  Goals reviewed with patient? Yes  SHORT TERM GOALS: Target date: 10 sessions; revised goal - additional 10 weeks  Patient will demonstrate improved expressive language skills during communication breakdowns by using multimodal means (semantic feature analysis, circumlocution, synonym/antonym use, gestures, writing, drawing) to repair with mod cues. Baseline: Goal status: MET  2.  With Moderate A, patient will complete a semantic feature analysis with at least 3-5 relevant features for 8/10 target words with cues to improve word-finding skills.  Baseline:  Goal status: MET  3.  With min A, patient will generate sentences with 5 or more words during structured task at 80% accuracy in order to increase ability to communicate basic wants and needs.  Baseline:  Goal status: REVISED  4.  Pt will participate in further assessment of functional reading and writing to help determine level of assistance needed for language based iADL tasks. Baseline:  Goal status: MET  5.  Patient will follow two step commands 70% acc in context of a functional activity.  Baseline:  Goal status: MET  **New goals added 06/03/23; target date: 10 weeks  6. Pt will co-create x2 scripts for improved participation in medical appointments and                  social events with moderate assistance.               7. Pt will create simple text messages to desired communication partners with min A.   LONG TERM GOALS: Target date: 12 weeks  Patient will demonstrate knowledge of appropriate activities to support functional receptive and expressive language outside of ST with assistance from family.  Baseline:  Goal status: IN PROGRESS  2.  Patient  will report improved confidence in conversations outside of home (appointments, friends, on the phone) than prior to ST, as measured by Communication Effectiveness Survey.  Baseline:  15/32 Goal status: IN PROGRESS; will complete prior to last session  3.  Patient and/or family will report use of strategies outside of ST to improve communication (use of scripts, pre-planning, semantic feature analysis, supported conversation).  Baseline:  Goal status: IN PROGRESS  4.  Patient will demonstrate improved ability with describing events, storytelling, or sharing medical information through personalized strategies (requesting time, using SFA, drawing, etc) as measured by Communication Effectiveness Survey.  Baseline:  Goal status: IN PROGRESS   ASSESSMENT:  CLINICAL IMPRESSION: Patient is a 67 y.o. 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 Pt making good progress. 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 See details of tx session above.  I recommend continued course of skilled SLP services targeting functional receptive and expressive communication to improve pt's participation in iADLs, preferred activities, and QoL.   OBJECTIVE IMPAIRMENTS include expressive language, receptive language, and aphasia. These impairments are limiting patient from managing medications, managing appointments, managing finances, household responsibilities, ADLs/IADLs, and effectively communicating at home and in community. Factors affecting potential to achieve goals and functional outcome are co-morbidities. Patient will benefit from skilled SLP services to address above impairments and improve overall function.  REHAB POTENTIAL: Excellent  PLAN: SLP FREQUENCY: 2x/week  SLP DURATION: 12 weeks  PLANNED INTERVENTIONS: Cueing hierachy,  Internal/external aids, Functional tasks, Multimodal communication approach, SLP instruction and feedback, Compensatory strategies, and Patient/family education    Clyde Canterbury, M.S., CCC-SLP Speech-Language Pathologist  [facility] Outpatient Rehabilitation at [facility] [address] [place], [place], [address] Phone: [phone]   Fax:  [phone]

## 2023-06-26 ENCOUNTER — Ambulatory Visit: Payer: Medicare HMO

## 2023-06-26 ENCOUNTER — Other Ambulatory Visit: Payer: Medicare HMO

## 2023-06-26 ENCOUNTER — Ambulatory Visit: Payer: Medicare HMO | Admitting: Oncology

## 2023-06-27 ENCOUNTER — Telehealth: Payer: Self-pay | Admitting: *Deleted

## 2023-06-27 ENCOUNTER — Encounter: Payer: Self-pay | Admitting: Oncology

## 2023-06-27 ENCOUNTER — Encounter: Payer: Medicare HMO | Admitting: Speech Pathology

## 2023-06-27 ENCOUNTER — Ambulatory Visit: Payer: Medicare HMO

## 2023-06-27 NOTE — Telephone Encounter (Signed)
Call from patient reporting tat since completing his radiation therapy 3 weeks ago, he is having bright red blood from rectum (he is using hemorrhoid cream on area), watery stools at least 4 times a day, difficulty sleeping, lethargy, weakness and he does not know what to do or who he is to contact about it. He also has a call in to his PCP about same. Please advise.

## 2023-06-27 NOTE — Telephone Encounter (Signed)
Called patient-apt scheduled for smc with Maralyn Sago, [place] tomorrow. Port apt at 845

## 2023-06-28 ENCOUNTER — Inpatient Hospital Stay: Payer: Medicare HMO | Admitting: Medical Oncology

## 2023-06-28 ENCOUNTER — Other Ambulatory Visit: Payer: Self-pay

## 2023-06-28 ENCOUNTER — Inpatient Hospital Stay: Payer: Medicare HMO

## 2023-06-28 ENCOUNTER — Encounter: Payer: Self-pay | Admitting: Oncology

## 2023-06-28 ENCOUNTER — Encounter: Payer: Self-pay | Admitting: Medical Oncology

## 2023-06-28 VITALS — BP 89/56 | HR 46 | Temp 97.8°F | Resp 22 | Ht 69.0 in | Wt 204.0 lb

## 2023-06-28 DIAGNOSIS — R197 Diarrhea, unspecified: Secondary | ICD-10-CM

## 2023-06-28 DIAGNOSIS — Z5111 Encounter for antineoplastic chemotherapy: Secondary | ICD-10-CM | POA: Diagnosis not present

## 2023-06-28 DIAGNOSIS — K649 Unspecified hemorrhoids: Secondary | ICD-10-CM

## 2023-06-28 DIAGNOSIS — C9000 Multiple myeloma not having achieved remission: Secondary | ICD-10-CM

## 2023-06-28 DIAGNOSIS — C184 Malignant neoplasm of transverse colon: Secondary | ICD-10-CM

## 2023-06-28 DIAGNOSIS — R7989 Other specified abnormal findings of blood chemistry: Secondary | ICD-10-CM

## 2023-06-28 DIAGNOSIS — D509 Iron deficiency anemia, unspecified: Secondary | ICD-10-CM

## 2023-06-28 DIAGNOSIS — G893 Neoplasm related pain (acute) (chronic): Secondary | ICD-10-CM

## 2023-06-28 DIAGNOSIS — D702 Other drug-induced agranulocytosis: Secondary | ICD-10-CM

## 2023-06-28 DIAGNOSIS — Z95828 Presence of other vascular implants and grafts: Secondary | ICD-10-CM

## 2023-06-28 DIAGNOSIS — D696 Thrombocytopenia, unspecified: Secondary | ICD-10-CM

## 2023-06-28 LAB — CBC WITH DIFFERENTIAL (CANCER CENTER ONLY)
Abs Immature Granulocytes: 0.17 10*3/uL — ABNORMAL HIGH (ref 0.00–0.07)
Basophils Absolute: 0 10*3/uL (ref 0.0–0.1)
Basophils Relative: 1 %
Eosinophils Absolute: 0 10*3/uL (ref 0.0–0.5)
Eosinophils Relative: 1 %
HCT: 25.5 % — ABNORMAL LOW (ref 39.0–52.0)
Hemoglobin: 8.1 g/dL — ABNORMAL LOW (ref 13.0–17.0)
Immature Granulocytes: 7 %
Lymphocytes Relative: 12 %
Lymphs Abs: 0.3 10*3/uL — ABNORMAL LOW (ref 0.7–4.0)
MCH: 28.4 pg (ref 26.0–34.0)
MCHC: 31.8 g/dL (ref 30.0–36.0)
MCV: 89.5 fL (ref 80.0–100.0)
Monocytes Absolute: 0.4 10*3/uL (ref 0.1–1.0)
Monocytes Relative: 15 %
Neutro Abs: 1.5 10*3/uL — ABNORMAL LOW (ref 1.7–7.7)
Neutrophils Relative %: 64 %
Platelet Count: 91 10*3/uL — ABNORMAL LOW (ref 150–400)
RBC: 2.85 MIL/uL — ABNORMAL LOW (ref 4.22–5.81)
RDW: 17.4 % — ABNORMAL HIGH (ref 11.5–15.5)
Smear Review: DECREASED
WBC Count: 2.4 10*3/uL — ABNORMAL LOW (ref 4.0–10.5)
nRBC: 0 % (ref 0.0–0.2)

## 2023-06-28 LAB — CMP (CANCER CENTER ONLY)
ALT: 38 U/L (ref 0–44)
AST: 39 U/L (ref 15–41)
Albumin: 3.4 g/dL — ABNORMAL LOW (ref 3.5–5.0)
Alkaline Phosphatase: 207 U/L — ABNORMAL HIGH (ref 38–126)
Anion gap: 5 (ref 5–15)
BUN: 27 mg/dL — ABNORMAL HIGH (ref 8–23)
CO2: 24 mmol/L (ref 22–32)
Calcium: 8.5 mg/dL — ABNORMAL LOW (ref 8.9–10.3)
Chloride: 105 mmol/L (ref 98–111)
Creatinine: 1.65 mg/dL — ABNORMAL HIGH (ref 0.61–1.24)
GFR, Estimated: 45 mL/min — ABNORMAL LOW (ref >60)
Glucose, Bld: 147 mg/dL — ABNORMAL HIGH (ref 70–99)
Potassium: 4.2 mmol/L (ref 3.5–5.1)
Sodium: 134 mmol/L — ABNORMAL LOW (ref 135–145)
Total Bilirubin: 1.3 mg/dL — ABNORMAL HIGH (ref 0.3–1.2)
Total Protein: 7.2 g/dL (ref 6.5–8.1)

## 2023-06-28 LAB — SAMPLE TO BLOOD BANK

## 2023-06-28 LAB — MAGNESIUM: Magnesium: 1.6 mg/dL — ABNORMAL LOW (ref 1.7–2.4)

## 2023-06-28 MED ORDER — HYDROCORTISONE (PERIANAL) 2.5 % EX CREA: 1.0000 | TOPICAL_CREAM | Freq: Two times a day (BID) | CUTANEOUS | 0 refills | Status: DC

## 2023-06-28 MED ORDER — SODIUM CHLORIDE 0.9 % IV SOLN
10.0000 mg | Freq: Once | INTRAVENOUS | Status: AC
  Administered 2023-06-28: 10 mg via INTRAVENOUS
  Filled 2023-06-28: qty 10

## 2023-06-28 MED ORDER — HEPARIN SOD (PORK) LOCK FLUSH 100 UNIT/ML IV SOLN
500.0000 [IU] | Freq: Once | INTRAVENOUS | Status: AC
  Administered 2023-06-28: 500 [IU] via INTRAVENOUS
  Filled 2023-06-28: qty 5

## 2023-06-28 MED ORDER — SODIUM CHLORIDE 0.9% FLUSH
10.0000 mL | Freq: Once | INTRAVENOUS | Status: AC
  Administered 2023-06-28: 10 mL via INTRAVENOUS
  Filled 2023-06-28: qty 10

## 2023-06-28 MED ORDER — HYDROCODONE-ACETAMINOPHEN 5-325 MG PO TABS: 1.0000 | ORAL_TABLET | Freq: Four times a day (QID) | ORAL | 0 refills | Status: DC | PRN

## 2023-06-28 MED ORDER — SODIUM CHLORIDE 0.9 % IV SOLN
INTRAVENOUS | Status: DC
  Filled 2023-06-28 (×2): qty 250

## 2023-06-28 MED ORDER — TRAZODONE HCL 50 MG PO TABS: 50.0000 mg | ORAL_TABLET | Freq: Every day | ORAL | 2 refills | Status: DC

## 2023-06-28 NOTE — Progress Notes (Signed)
[facility]  Sympt[facility] Management Clinic Telephone:(336) [phone] Fax:(336) [phone]  ID: Chris George OB: 03-Dec-1955  MR#: [id number]  QIO#:[id number]  Patient Care Team: Marina Goodell, MD as PCP - General (Family Medicine) [name], MD as Consulting Physician (Oncology) Morene Crocker, MD as Referring Physician (Neurology) Lamar Blinks, MD as Consulting Physician (Cardiology)  Oncological History: Recurrent colon cancer, smoldering myeloma, prostate cancer.  INTERVAL HISTORY: Patient is S/p 3 cycles of FOLFIRI.  He is here for consideration of IVF for supportive care following chemotherapy for diarrhea and rectal bleeding that he has been having since completing his XRT treatment 3 weeks ago. His last FOLFIRI treatments(cycle 3) were on 06/19/2023 and 06/21/2023.   He reports that he has struggled with hemorrhoids and hemorrhoidal bleeding since finishing XRT. Secondary to diarrhea. Has some bright red blood bleeding with bowel movements. Hemorrhoids are also painful. He is not sleeping well as he is having bowel movements every few hours and they cause his hemorrhoid pain to bother him. He is using warm baths and preparation H pads with mild relief. The sleep deprivation is getting to him- he is only sleeping about 2 hours per night due to this. He is tearful today- he wants to feel better.   He continues to have a mild expressive aphasia, but no other neurologic complaints. He denies any recent fevers or illnesses.  He has no chest pain, shortness of breath, cough, or hemoptysis.  He denies any nausea, vomiting, constipation. He has no urinary complaints.  Patient offers no further specific complaints today.  Wt Readings from Last 3 Encounters:  06/28/23 204 lb (92.5 kg)  06/19/23 204 lb (92.5 kg)  06/15/23 217 lb 13 oz (98.8 kg)   REVIEW OF SYSTEMS:   Review of Systems  Constitutional:  Positive for malaise/fatigue. Negative for fever and  weight loss.  Respiratory: Negative.  Negative for cough, hemoptysis and shortness of breath.   Cardiovascular: Negative.  Negative for chest pain and leg swelling.  Gastrointestinal:  Positive for diarrhea. Negative for blood in stool and melena.  Genitourinary: Negative.  Negative for hematuria.  Musculoskeletal: Negative.  Negative for back pain and joint pain.  Skin: Negative.  Negative for rash.  Neurological:  Positive for tingling, sensory change and weakness. Negative for dizziness, focal weakness and headaches.  Psychiatric/Behavioral: Negative.  Negative for depression. The patient is not nervous/anxious.     As per HPI. Otherwise, a complete review of systems is negative.  PAST MEDICAL HISTORY: Past Medical History:  Diagnosis Date   A-fib (HCC)    Anemia    Arthritis    ankles   Cancer (HCC)    multiple myeloma   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HCC) 10/26/2019   Cardiomyopathy (HCC)    Chemotherapy-induced peripheral neuropathy (HCC)    CHF (congestive heart failure) (HCC)    Depression    Dysrhythmia    atrial fib   History of CVA (cerebrovascular accident)    HLD (hyperlipidemia)    Hypercholesteremia    Hypertension    Moderate tricuspid insufficiency    Multiple myeloma (HCC)    not being treated right now per pt 11/11/19   Pneumonia    Sleep apnea    No CPAP.  OSA resolved with wt loss.    PAST SURGICAL HISTORY: Past Surgical History:  Procedure Laterality Date   ATRIAL ABLATION SURGERY     CARDIAC SURGERY     A-Fib Ablations   COLONOSCOPY WITH PROPOFOL  N/A 10/26/2019   Procedure: COLONOSCOPY WITH BIOPSY;  Surgeon: [name], MD;  Location: [facility];  Service: Endoscopy;  Laterality: N/A;   COLONOSCOPY WITH PROPOFOL N/A 09/03/2022   Procedure: COLONOSCOPY WITH PROPOFOL;  Surgeon: [name], MD;  Location: [facility];  Service: Endoscopy;  Laterality: N/A;   ESOPHAGOGASTRODUODENOSCOPY (EGD) WITH PROPOFOL N/A 10/26/2019    Procedure: ESOPHAGOGASTRODUODENOSCOPY (EGD) WITH BIOPSY;  Surgeon: [name], MD;  Location: [facility];  Service: Endoscopy;  Laterality: N/A;  sleep apnea   LAPAROSCOPIC PARTIAL COLECTOMY Right 11/17/2019   Procedure: LAPAROSCOPIC PARTIAL COLECTOMY RIGHT EXTENDED;  Surgeon: [name], MD;  Location: [facility];  Service: General;  Laterality: Right;   PORTACATH PLACEMENT N/A 12/28/2019   Procedure: INSERTION PORT-A-CATH Left subclavian;  Surgeon: [name], MD;  Location: [facility];  Service: General;  Laterality: N/A;    FAMILY HISTORY: Family History  Problem Relation Age of Onset   Healthy Mother     ADVANCED DIRECTIVES (Y/N):  N  HEALTH MAINTENANCE: Social History   Tobacco Use   Smoking status: Never    Passive exposure: Never   Smokeless tobacco: Never  Vaping Use   Vaping status: Never Used  Substance Use Topics   Alcohol use: Not Currently   Drug use: Never   No Known Allergies  Current Outpatient Medications  Medication Sig Dispense Refill   allopurinol (ZYLOPRIM) 100 MG tablet Take 200 mg by mouth daily.     apixaban (ELIQUIS) 5 MG TABS tablet Take 5 mg by mouth 2 (two) times daily.     APPLE CIDER VINEGAR PO Take 30-45 mLs by mouth every other day.      atorvastatin (LIPITOR) 80 MG tablet Take 80 mg by mouth daily.     colchicine 0.6 MG tablet Take 1 tablet (0.6 mg total) by mouth 2 (two) times daily. Until flare resolves. 20 tablet 1   HYDROcodone-acetaminophen (NORCO) 5-325 MG tablet Take 1 tablet by mouth every 6 (six) hours as needed for moderate pain. 30 tablet 0   hydrocortisone (ANUSOL-HC) 2.5 % rectal cream Place 1 Application rectally 2 (two) times daily. 30 g 0   hydrocortisone cream 1 % Apply 1 Application topically 2 (two) times daily.     Multiple Vitamins-Minerals (ZINC PO) Take by mouth.     potassium bicarbonate (K-LYTE) 25 MEQ disintegrating tablet Take 25 mEq by mouth 2 (two) times daily. In the afternoon.     tamsulosin (FLOMAX)  0.4 MG CAPS capsule Take 1 capsule (0.4 mg total) by mouth daily after supper. 30 capsule 6   traZODone (DESYREL) 50 MG tablet Take 1 tablet (50 mg total) by mouth at bedtime. Use caution if used with the norco pain medication 30 tablet 2   Zinc Sulfate (ZINC 15 PO) Take by mouth.     furosemide (LASIX) 80 MG tablet Take 40 mg by mouth daily. Morning & late afternoon (Patient not taking: Reported on 06/28/2023)     MAGNESIUM PO Take by mouth. (Patient not taking: Reported on 06/28/2023)     Misc Natural Products (TART CHERRY ADVANCED PO) Take 60 mLs by mouth every other day. (Patient not taking: Reported on 06/28/2023)     prochlorperazine (COMPAZINE) 10 MG tablet Take 1 tablet (10 mg total) by mouth every 6 (six) hours as needed for nausea or vomiting. (Patient not taking: Reported on 06/28/2023) 60 tablet 1   No current facility-administered medications for this visit.   Facility-Administered Medications Ordered in Other Visits  Medication  Dose Route Frequency Provider Last Rate Last Admin   0.9 %  sodium chloride infusion   Intravenous Continuous Rushie Chestnut, PA-C   Stopped at 06/28/23 1041   heparin lock flush 100 unit/mL  500 Units Intravenous Once [name], MD        OBJECTIVE: Vitals:   06/28/23 0845 06/28/23 0915  BP: (!) 89/56   Pulse: (!) 46   Resp: (!) 22   Temp: 97.8 F (36.6 C)   SpO2:  99%   Body mass index is 30.13 kg/m.    ECOG FS:0 - Asymptomatic  General: Well-developed, well-nourished, no acute distress. No jaundice  Eyes: Pink conjunctiva, anicteric sclera. HEENT: Normocephalic, moist mucous membranes. Lungs: No audible wheezing or coughing. Heart: Regular rate and rhythm. Abdomen: Soft, nontender, no obvious distention. Musculoskeletal: No edema, cyanosis, or clubbing. Neuro: Alert, answering all questions appropriately. Cranial nerves grossly intact. Skin: No rashes or petechiae noted. Psych: Normal affect. Tearful when discussing his sleep  and pain Rectal: Deferred   LAB RESULTS:  Lab Results  Component Value Date   NA 134 (L) 06/28/2023   K 4.2 06/28/2023   CL 105 06/28/2023   CO2 24 06/28/2023   GLUCOSE 147 (H) 06/28/2023   BUN 27 (H) 06/28/2023   CREATININE 1.65 (H) 06/28/2023   CALCIUM 8.5 (L) 06/28/2023   PROT 7.2 06/28/2023   ALBUMIN 3.4 (L) 06/28/2023   AST 39 06/28/2023   ALT 38 06/28/2023   ALKPHOS 207 (H) 06/28/2023   BILITOT 1.3 (H) 06/28/2023   GFRNONAA 45 (L) 06/28/2023   GFRAA >60 07/29/2020    Lab Results  Component Value Date   WBC 2.4 (L) 06/28/2023   NEUTROABS 1.5 (L) 06/28/2023   HGB 8.1 (L) 06/28/2023   HCT 25.5 (L) 06/28/2023   MCV 89.5 06/28/2023   PLT 91 (L) 06/28/2023   Lab Results  Component Value Date   IRON 41 (L) 05/11/2020   TIBC 323 05/11/2020   IRONPCTSAT 13 (L) 05/11/2020   Lab Results  Component Value Date   FERRITIN 86 05/11/2020   ASSESSMENT: Stage IIIc colon cancer, smoldering myeloma, prostate cancer.  PLAN:    Stage IIIc colon cancer: Patient completed cycle 9 of adjuvant FOLFOX on June 03, 2020.  Given his difficulties with treatment and declining performance status, treatment was discontinued altogether. Colonoscopy on September 03, 2022 did not reveal any significant pathology and recommendation was to repeat in 3 years.  CT scan from December 07, 2022 reviewed independently with enlarging aortocaval node now measuring 1.9 x 1.4 cm.  PET scan results from Mar 15, 2023 reviewed independently with progressive retroperitoneal lymphadenopathy presumably related to patient's colon cancer.  His CEA also continues to trend up and his most recent result was 194. Treatment was previously dose reduced 10% given his renal insufficiency and baseline pancytopenia.  Avastin on hold at this time given his recent stroke.  Diarrhea/Dehydration: Likely secondary to chemotherapy/XRT but given immunocompromised status and extent of diarrhea will check stool studies. 500 ml IVF given  today (given anemia) for support and BP improvement.  Rectal Pain: Secondary to hemorrhoids and XRT/diease: Treating this cancer related pain with Norco as needed for severe pain which he has tolerated well before. He will try Trazodone for sleep as well but will not take them together given risks of sedation. Reviewed precautions and warnings. RTC next week.  Hypomagnesemia: Mild. He will restart oral supplement. Will monitor.  Anemia: Secondary to chemotherapy, MM, and hemorrhoidal bleeding: Treating  hemorrhoids with anusol cream. Sitz baths recommended. Bidet toilet seat recommended. Will recheck counts at his follow up next week and prepare for a blood transfusion if needed.  Hemorrhoids: New to me. See above.  Neutropenia: Chronic secondary to MM and chemotherapy. Will monitor. May need further chemotherapy dose adjustment.  Thrombocytopenia: Chronic. Multifactorial- chemotherapy, MM. Will monitor.  Elevated Creatinine: Improved today at 1.65. Monitor  Hypocalcemia: Chronic. Secondary to chemo, reduced diet, diarrhea. See above Elevated LFTs: Acute on chronic. May be reactive. Will monitor closely.   Disposition 500 ml IVF today, 10 mg decadron RTC 8/28 as planned- add hold tube and possible 1 unit of blood on 08/29  Patient expressed understanding and was in agreement with this plan. He also understands that He can call clinic at any time with any questions, concerns, or complaints.    Rushie Chestnut, PA-C   06/28/2023 12:38 PM

## 2023-07-02 MED FILL — Dexamethasone Sodium Phosphate Inj 100 MG/10ML: INTRAMUSCULAR | Qty: 1 | Status: AC

## 2023-07-03 ENCOUNTER — Inpatient Hospital Stay: Payer: Medicare HMO

## 2023-07-03 ENCOUNTER — Encounter: Payer: Medicare HMO | Admitting: Speech Pathology

## 2023-07-03 ENCOUNTER — Ambulatory Visit: Payer: Medicare HMO

## 2023-07-03 ENCOUNTER — Inpatient Hospital Stay: Payer: Medicare HMO | Admitting: Oncology

## 2023-07-03 DIAGNOSIS — C184 Malignant neoplasm of transverse colon: Secondary | ICD-10-CM

## 2023-07-03 DIAGNOSIS — Z5111 Encounter for antineoplastic chemotherapy: Secondary | ICD-10-CM | POA: Diagnosis not present

## 2023-07-03 LAB — CBC WITH DIFFERENTIAL (CANCER CENTER ONLY)
Abs Immature Granulocytes: 0.36 10*3/uL — ABNORMAL HIGH (ref 0.00–0.07)
Basophils Absolute: 0 10*3/uL (ref 0.0–0.1)
Basophils Relative: 0 %
Eosinophils Absolute: 0.1 10*3/uL (ref 0.0–0.5)
Eosinophils Relative: 1 %
HCT: 25.5 % — ABNORMAL LOW (ref 39.0–52.0)
Hemoglobin: 7.9 g/dL — ABNORMAL LOW (ref 13.0–17.0)
Immature Granulocytes: 7 %
Lymphocytes Relative: 8 %
Lymphs Abs: 0.4 10*3/uL — ABNORMAL LOW (ref 0.7–4.0)
MCH: 28.3 pg (ref 26.0–34.0)
MCHC: 31 g/dL (ref 30.0–36.0)
MCV: 91.4 fL (ref 80.0–100.0)
Monocytes Absolute: 0.6 10*3/uL (ref 0.1–1.0)
Monocytes Relative: 12 %
Neutro Abs: 3.9 10*3/uL (ref 1.7–7.7)
Neutrophils Relative %: 72 %
Platelet Count: 102 10*3/uL — ABNORMAL LOW (ref 150–400)
RBC: 2.79 MIL/uL — ABNORMAL LOW (ref 4.22–5.81)
RDW: 19.6 % — ABNORMAL HIGH (ref 11.5–15.5)
WBC Count: 5.3 10*3/uL (ref 4.0–10.5)
nRBC: 0.6 % — ABNORMAL HIGH (ref 0.0–0.2)

## 2023-07-03 LAB — CMP (CANCER CENTER ONLY)
ALT: 33 U/L (ref 0–44)
AST: 28 U/L (ref 15–41)
Albumin: 3.4 g/dL — ABNORMAL LOW (ref 3.5–5.0)
Alkaline Phosphatase: 184 U/L — ABNORMAL HIGH (ref 38–126)
Anion gap: 4 — ABNORMAL LOW (ref 5–15)
BUN: 29 mg/dL — ABNORMAL HIGH (ref 8–23)
CO2: 24 mmol/L (ref 22–32)
Calcium: 8.6 mg/dL — ABNORMAL LOW (ref 8.9–10.3)
Chloride: 108 mmol/L (ref 98–111)
Creatinine: 1.85 mg/dL — ABNORMAL HIGH (ref 0.61–1.24)
GFR, Estimated: 39 mL/min — ABNORMAL LOW (ref >60)
Glucose, Bld: 112 mg/dL — ABNORMAL HIGH (ref 70–99)
Potassium: 4.1 mmol/L (ref 3.5–5.1)
Sodium: 136 mmol/L (ref 135–145)
Total Bilirubin: 0.6 mg/dL (ref 0.3–1.2)
Total Protein: 6.8 g/dL (ref 6.5–8.1)

## 2023-07-03 LAB — MAGNESIUM: Magnesium: 1.8 mg/dL (ref 1.7–2.4)

## 2023-07-03 LAB — SAMPLE TO BLOOD BANK

## 2023-07-03 MED ORDER — PALONOSETRON HCL INJECTION 0.25 MG/5ML
0.2500 mg | Freq: Once | INTRAVENOUS | Status: AC
  Administered 2023-07-03: 0.25 mg via INTRAVENOUS
  Filled 2023-07-03: qty 5

## 2023-07-03 MED ORDER — SODIUM CHLORIDE 0.9 % IV SOLN
360.0000 mg/m2 | Freq: Once | INTRAVENOUS | Status: AC
  Administered 2023-07-03: 782 mg via INTRAVENOUS
  Filled 2023-07-03: qty 39.1

## 2023-07-03 MED ORDER — SODIUM CHLORIDE 0.9 % IV SOLN
160.0000 mg/m2 | Freq: Once | INTRAVENOUS | Status: AC
  Administered 2023-07-03: 340 mg via INTRAVENOUS
  Filled 2023-07-03: qty 17

## 2023-07-03 MED ORDER — SODIUM CHLORIDE 0.9 % IV SOLN
2160.0000 mg/m2 | INTRAVENOUS | Status: DC
  Administered 2023-07-03: 5000 mg via INTRAVENOUS
  Filled 2023-07-03: qty 100

## 2023-07-03 MED ORDER — FLUOROURACIL CHEMO INJECTION 2.5 GM/50ML
360.0000 mg/m2 | Freq: Once | INTRAVENOUS | Status: AC
  Administered 2023-07-03: 800 mg via INTRAVENOUS
  Filled 2023-07-03: qty 16

## 2023-07-03 MED ORDER — SODIUM CHLORIDE 0.9 % IV SOLN
10.0000 mg | Freq: Once | INTRAVENOUS | Status: AC
  Administered 2023-07-03: 10 mg via INTRAVENOUS
  Filled 2023-07-03: qty 10

## 2023-07-03 MED ORDER — ATROPINE SULFATE 1 MG/ML IV SOLN
0.5000 mg | Freq: Once | INTRAVENOUS | Status: AC | PRN
  Administered 2023-07-03: 0.5 mg via INTRAVENOUS
  Filled 2023-07-03: qty 1

## 2023-07-03 MED ORDER — SODIUM CHLORIDE 0.9 % IV SOLN
Freq: Once | INTRAVENOUS | Status: AC
  Filled 2023-07-03: qty 250

## 2023-07-03 NOTE — Progress Notes (Signed)
Patient states he's still having issues after radiation. He also states he's been tired a lot more than normal.

## 2023-07-03 NOTE — Progress Notes (Signed)
[facility]  Telephone:(336) [phone] Fax:(336) [phone]  ID: Chris George OB: Sep 22, 1956  MR#: [id number]  AOZ#:[id number]  Patient Care Team: Marina Goodell, MD as PCP - General (Family Medicine) [name], MD as Consulting Physician (Oncology) Morene Crocker, MD as Referring Physician (Neurology) Lamar Blinks, MD as Consulting Physician (Cardiology)  CHIEF COMPLAINT: Recurrent colon cancer, smoldering myeloma, prostate cancer.  INTERVAL HISTORY: Patient returns to clinic today for further evaluation and consideration of cycle 4 of FOLFIRI.  He continues to have chronic weakness and fatigue, but otherwise feels well.  He continues to have a mild expressive aphasia, but no other neurologic complaints. He denies any recent fevers or illnesses.  He has a good appetite and denies weight loss.  He has no chest pain, shortness of breath, cough, or hemoptysis.  He denies any nausea, vomiting, constipation, or diarrhea.  He has no melena or hematochezia.  He has no urinary complaints.  Patient offers no further specific complaints today.  REVIEW OF SYSTEMS:   Review of Systems  Constitutional:  Positive for malaise/fatigue. Negative for fever and weight loss.  Respiratory: Negative.  Negative for cough, hemoptysis and shortness of breath.   Cardiovascular: Negative.  Negative for chest pain and leg swelling.  Gastrointestinal: Negative.  Negative for blood in stool, diarrhea and melena.  Genitourinary: Negative.  Negative for hematuria.  Musculoskeletal: Negative.  Negative for back pain and joint pain.  Skin: Negative.  Negative for rash.  Neurological:  Positive for tingling, sensory change and weakness. Negative for dizziness, focal weakness and headaches.  Psychiatric/Behavioral: Negative.  Negative for depression. The patient is not nervous/anxious.     As per HPI. Otherwise, a complete review of systems is negative.  PAST MEDICAL HISTORY: Past  Medical History:  Diagnosis Date   A-fib (HCC)    Anemia    Arthritis    ankles   Cancer (HCC)    multiple myeloma   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HCC) 10/26/2019   Cardiomyopathy (HCC)    Chemotherapy-induced peripheral neuropathy (HCC)    CHF (congestive heart failure) (HCC)    Depression    Dysrhythmia    atrial fib   History of CVA (cerebrovascular accident)    HLD (hyperlipidemia)    Hypercholesteremia    Hypertension    Moderate tricuspid insufficiency    Multiple myeloma (HCC)    not being treated right now per pt 11/11/19   Pneumonia    Sleep apnea    No CPAP.  OSA resolved with wt loss.    PAST SURGICAL HISTORY: Past Surgical History:  Procedure Laterality Date   ATRIAL ABLATION SURGERY     CARDIAC SURGERY     A-Fib Ablations   COLONOSCOPY WITH PROPOFOL N/A 10/26/2019   Procedure: COLONOSCOPY WITH BIOPSY;  Surgeon: [name], MD;  Location: [facility];  Service: Endoscopy;  Laterality: N/A;   COLONOSCOPY WITH PROPOFOL N/A 09/03/2022   Procedure: COLONOSCOPY WITH PROPOFOL;  Surgeon: [name], MD;  Location: [facility];  Service: Endoscopy;  Laterality: N/A;   ESOPHAGOGASTRODUODENOSCOPY (EGD) WITH PROPOFOL N/A 10/26/2019   Procedure: ESOPHAGOGASTRODUODENOSCOPY (EGD) WITH BIOPSY;  Surgeon: [name], MD;  Location: [facility];  Service: Endoscopy;  Laterality: N/A;  sleep apnea   LAPAROSCOPIC PARTIAL COLECTOMY Right 11/17/2019   Procedure: LAPAROSCOPIC PARTIAL COLECTOMY RIGHT EXTENDED;  Surgeon: [name], MD;  Location: [facility];  Service: General;  Laterality: Right;   PORTACATH PLACEMENT N/A 12/28/2019   Procedure: INSERTION PORT-A-CATH  Left subclavian;  Surgeon: [name], MD;  Location: [facility];  Service: General;  Laterality: N/A;    FAMILY HISTORY: Family History  Problem Relation Age of Onset   Healthy Mother     ADVANCED DIRECTIVES (Y/N):  N  HEALTH MAINTENANCE: Social History   Tobacco Use   Smoking  status: Never    Passive exposure: Never   Smokeless tobacco: Never  Vaping Use   Vaping status: Never Used  Substance Use Topics   Alcohol use: Not Currently   Drug use: Never     Colonoscopy:  PAP:  Bone density:  Lipid panel:  No Known Allergies  Current Outpatient Medications  Medication Sig Dispense Refill   allopurinol (ZYLOPRIM) 100 MG tablet Take 200 mg by mouth daily.     apixaban (ELIQUIS) 5 MG TABS tablet Take 5 mg by mouth 2 (two) times daily.     APPLE CIDER VINEGAR PO Take 30-45 mLs by mouth every other day.      atorvastatin (LIPITOR) 80 MG tablet Take 80 mg by mouth daily.     colchicine 0.6 MG tablet Take 1 tablet (0.6 mg total) by mouth 2 (two) times daily. Until flare resolves. 20 tablet 1   HYDROcodone-acetaminophen (NORCO) 5-325 MG tablet Take 1 tablet by mouth every 6 (six) hours as needed for moderate pain. 30 tablet 0   hydrocortisone (ANUSOL-HC) 2.5 % rectal cream Place 1 Application rectally 2 (two) times daily. 30 g 0   hydrocortisone cream 1 % Apply 1 Application topically 2 (two) times daily.     Multiple Vitamins-Minerals (ZINC PO) Take by mouth.     potassium bicarbonate (K-LYTE) 25 MEQ disintegrating tablet Take 25 mEq by mouth 2 (two) times daily. In the afternoon.     tamsulosin (FLOMAX) 0.4 MG CAPS capsule Take 1 capsule (0.4 mg total) by mouth daily after supper. 30 capsule 6   traZODone (DESYREL) 50 MG tablet Take 1 tablet (50 mg total) by mouth at bedtime. Use caution if used with the norco pain medication 30 tablet 2   Zinc Sulfate (ZINC 15 PO) Take by mouth.     furosemide (LASIX) 80 MG tablet Take 40 mg by mouth daily. Morning & late afternoon (Patient not taking: Reported on 06/28/2023)     MAGNESIUM PO Take by mouth. (Patient not taking: Reported on 06/28/2023)     Misc Natural Products (TART CHERRY ADVANCED PO) Take 60 mLs by mouth every other day. (Patient not taking: Reported on 06/28/2023)     prochlorperazine (COMPAZINE) 10 MG tablet  Take 1 tablet (10 mg total) by mouth every 6 (six) hours as needed for nausea or vomiting. (Patient not taking: Reported on 06/28/2023) 60 tablet 1   No current facility-administered medications for this visit.   Facility-Administered Medications Ordered in Other Visits  Medication Dose Route Frequency Provider Last Rate Last Admin   heparin lock flush 100 unit/mL  500 Units Intravenous Once [name], MD        OBJECTIVE: Vitals:   07/03/23 0844  BP: 97/63  Pulse: (!) 46  Resp: 18  Temp: (!) 96 F (35.6 C)  SpO2: 100%      Body mass index is 30.67 kg/m.    ECOG FS:0 - Asymptomatic  General: Well-developed, well-nourished, no acute distress. Eyes: Pink conjunctiva, anicteric sclera. HEENT: Normocephalic, moist mucous membranes. Lungs: No audible wheezing or coughing. Heart: Regular rate and rhythm. Abdomen: Soft, nontender, no obvious distention. Musculoskeletal: No edema, cyanosis, or clubbing. Neuro:  Alert, answering all questions appropriately. Cranial nerves grossly intact. Skin: No rashes or petechiae noted. Psych: Normal affect.  LAB RESULTS:  Lab Results  Component Value Date   NA 136 07/03/2023   K 4.1 07/03/2023   CL 108 07/03/2023   CO2 24 07/03/2023   GLUCOSE 112 (H) 07/03/2023   BUN 29 (H) 07/03/2023   CREATININE 1.85 (H) 07/03/2023   CALCIUM 8.6 (L) 07/03/2023   PROT 6.8 07/03/2023   ALBUMIN 3.4 (L) 07/03/2023   AST 28 07/03/2023   ALT 33 07/03/2023   ALKPHOS 184 (H) 07/03/2023   BILITOT 0.6 07/03/2023   GFRNONAA 39 (L) 07/03/2023   GFRAA >60 07/29/2020    Lab Results  Component Value Date   WBC 5.3 07/03/2023   NEUTROABS 3.9 07/03/2023   HGB 7.9 (L) 07/03/2023   HCT 25.5 (L) 07/03/2023   MCV 91.4 07/03/2023   PLT 102 (L) 07/03/2023   Lab Results  Component Value Date   IRON 41 (L) 05/11/2020   TIBC 323 05/11/2020   IRONPCTSAT 13 (L) 05/11/2020   Lab Results  Component Value Date   FERRITIN 86 05/11/2020      STUDIES: No results found.   ASSESSMENT: Stage IIIc colon cancer, smoldering myeloma, prostate cancer.  PLAN:    Stage IIIc colon cancer: Patient completed cycle 9 of adjuvant FOLFOX on June 03, 2020.  Given his difficulties with treatment and declining performance status, treatment was discontinued altogether. Colonoscopy on September 03, 2022 did not reveal any significant pathology and recommendation was to repeat in 3 years.  CT scan from December 07, 2022 reviewed independently with enlarging aortocaval node now measuring 1.9 x 1.4 cm.  PET scan results from Mar 15, 2023 reviewed independently with progressive retroperitoneal lymphadenopathy presumably related to patient's colon cancer.  His CEA also continues to trend up and his most recent result was 194.  Today's result is pending.  Proceed with cycle 4 of treatment today. Treatment was previously dose reduced 10% given his renal insufficiency and baseline pancytopenia.  Will hold Avastin at this time given his recent stroke.  Return to clinic in 2 days for pump removal, Udenyca, and 1 unit of packed red blood cells.  Patient was then return to clinic in 2 weeks for further evaluation and consideration of cycle 5.  Plan to reimage at the conclusion of cycle 6. Prostate cancer: Gleason's 3+4.  Patient's PSA is increased, but relatively stable at 32.4.  Imaging as above.  Nuclear medicine bone scan on December 13, 2022 was reported as negative.  PSMA PET per radiation oncology from Mar 21, 2023 did not reveal metastatic disease.  Patient completed XRT for local control disease on May 30, 2023.  Smoldering myeloma: Chronic and unchanged.  Patient was initially diagnosed and treated in [place], [place] and treated with single agent Velcade in approximately 2013.  He declined maintenance treatment or referral for bone marrow transplant.  His M spike has ranged from 1.1-2.0 since July 2020.  His most recent result was 1.6.  Over the same timeframe  his IgG component has ranged from 812 106 3165.  His most recent result is 2344.  Finally, his kappa free light chains have ranged from 24.8 -75.3 with his most recent result reported at 36.2.  Nuclear med bone scan as above.  His most recent bone marrow biopsy completed on May 28, 2019 revealed only 10 to 15% plasma cells with no clonality reported.  Cytogenetics were also reported as normal.  Continue to monitor  closely.  Anemia: Hemoglobin trended down to 7.9 and patient is symptomatic.  Proceed with 1 unit packed red blood cells along with pump removal as above. Neutropenia: Resolved.  Continue Udenyca with pump removal.  Dose reduced FOLFIRI as above. Thrombocytopenia: Platelets are 102 today.  Proceed with treatment as above.  May have to consider treating every 3 weeks. Renal insufficiency: Creatinine is 1.85 today.  Neither irinotecan or 5-FU need to be dose reduced in the setting of renal insufficiency.  Patient reports he has not seen nephrology in several years.  If his creatinine continues to remain elevated, will give a referral back. Peripheral neuropathy: Chronic and unchanged.  Patient reports gabapentin did not help.  He was recently initiated on Lyrica 50 mg daily and given a referral to neurology.   Hypotension: Chronic and unchanged.  Patient expressed understanding and was in agreement with this plan. He also understands that He can call clinic at any time with any questions, concerns, or complaints.    [name], MD   07/03/2023 9:00 AM

## 2023-07-03 NOTE — Patient Instructions (Signed)
[facility]  Discharge Instructions: Thank you for choosing [facility] to provide your oncology and hematology care.  If you have a lab appointment with the Cancer Center, please go directly to the Cancer Center and check in at the registration area.  Wear comfortable clothing and clothing appropriate for easy access to any Portacath or PICC line.   We strive to give you quality time with your provider. You may need to reschedule your appointment if you arrive late (15 or more minutes).  Arriving late affects you and other patients whose appointments are after yours.  Also, if you miss three or more appointments without notifying the office, you may be dismissed from the clinic at the provider's discretion.      For prescription refill requests, have your pharmacy contact our office and allow 72 hours for refills to be completed.    Today you received the following chemotherapy and/or immunotherapy agents IRINOTECAN, LEUCOVORIN, 5 Fu      To help prevent nausea and vomiting after your treatment, we encourage you to take your nausea medication as directed.  BELOW ARE SYMPTOMS THAT SHOULD BE REPORTED IMMEDIATELY: *FEVER GREATER THAN 100.4 F (38 C) OR HIGHER *CHILLS OR SWEATING *NAUSEA AND VOMITING THAT IS NOT CONTROLLED WITH YOUR NAUSEA MEDICATION *UNUSUAL SHORTNESS OF BREATH *UNUSUAL BRUISING OR BLEEDING *URINARY PROBLEMS (pain or burning when urinating, or frequent urination) *BOWEL PROBLEMS (unusual diarrhea, constipation, pain near the anus) TENDERNESS IN MOUTH AND THROAT WITH OR WITHOUT PRESENCE OF ULCERS (sore throat, sores in mouth, or a toothache) UNUSUAL RASH, SWELLING OR PAIN  UNUSUAL VAGINAL DISCHARGE OR ITCHING   Items with * indicate a potential emergency and should be followed up as soon as possible or go to the Emergency Department if any problems should occur.  Please show the CHEMOTHERAPY ALERT CARD or IMMUNOTHERAPY ALERT  CARD at check-in to the Emergency Department and triage nurse.  Should you have questions after your visit or need to cancel or reschedule your appointment, please contact [name]  [phone] and follow the prompts.  Office hours are 8:00 a.m. to 4:30 p.m. Monday - Friday. Please note that voicemails left after 4:00 p.m. may not be returned until the following business day.  We are closed weekends and major holidays. You have access to a nurse at all times for urgent questions. Please call the main number to the clinic [phone] and follow the prompts.  For any non-urgent questions, you may also contact your provider using MyChart. We now offer e-Visits for anyone 77 and older to request care online for non-urgent symptoms. For details visit [email or web address].   Also download the MyChart app! Go to the app store, search "MyChart", open the app, select [place], and log in with your MyChart username and password.  Irinotecan Injection What is this medication? IRINOTECAN (ir in oh TEE kan) treats some types of cancer. It works by slowing down the growth of cancer cells. This medicine may be used for other purposes; ask your health care provider or pharmacist if you have questions. COMMON BRAND NAME(S): Camptosar What should I tell my care team before I take this medication? They need to know if you have any of these conditions: Dehydration Diarrhea Infection, especially a viral infection, such as chickenpox, cold sores, herpes Liver disease Low blood cell levels (white cells, red cells, and platelets) Low levels of electrolytes, such as calcium, magnesium, or potassium in your blood  Recent or ongoing radiation An unusual or allergic reaction to irinotecan, other medications, foods, dyes, or preservatives If you or your partner are pregnant or trying to get pregnant Breast-feeding How should I use this medication? This medication is injected  into a vein. It is given by your care team in a hospital or clinic setting. Talk to your care team about the use of this medication in children. Special care may be needed. Overdosage: If you think you have taken too much of this medicine contact a poison control center or emergency room at once. NOTE: This medicine is only for you. Do not share this medicine with others. What if I miss a dose? Keep appointments for follow-up doses. It is important not to miss your dose. Call your care team if you are unable to keep an appointment. What may interact with this medication? Do not take this medication with any of the following: Cobicistat Itraconazole This medication may also interact with the following: Certain antibiotics, such as clarithromycin, rifampin, rifabutin Certain antivirals for HIV or AIDS Certain medications for fungal infections, such as ketoconazole, posaconazole, voriconazole Certain medications for seizures, such as carbamazepine, phenobarbital, phenytoin Gemfibrozil Nefazodone St. John's wort This list may not describe all possible interactions. Give your health care provider a list of all the medicines, herbs, non-prescription drugs, or dietary supplements you use. Also tell them if you smoke, drink alcohol, or use illegal drugs. Some items may interact with your medicine. What should I watch for while using this medication? Your condition will be monitored carefully while you are receiving this medication. You may need blood work while taking this medication. This medication may make you feel generally unwell. This is not uncommon as chemotherapy can affect healthy cells as well as cancer cells. Report any side effects. Continue your course of treatment even though you feel ill unless your care team tells you to stop. This medication can cause serious side effects. To reduce the risk, your care team may give you other medications to take before receiving this one. Be sure to  follow the directions from your care team. This medication may affect your coordination, reaction time, or judgement. Do not drive or operate machinery until you know how this medication affects you. Sit up or stand slowly to reduce the risk of dizzy or fainting spells. Drinking alcohol with this medication can increase the risk of these side effects. This medication may increase your risk of getting an infection. Call your care team for advice if you get a fever, chills, sore throat, or other symptoms of a cold or flu. Do not treat yourself. Try to avoid being around people who are sick. Avoid taking medications that contain aspirin, acetaminophen, ibuprofen, naproxen, or ketoprofen unless instructed by your care team. These medications may hide a fever. This medication may increase your risk to bruise or bleed. Call your care team if you notice any unusual bleeding. Be careful brushing or flossing your teeth or using a toothpick because you may get an infection or bleed more easily. If you have any dental work done, tell your dentist you are receiving this medication. Talk to your care team if you or your partner are pregnant or think either of you might be pregnant. This medication can cause serious birth defects if taken during pregnancy and for 6 months after the last dose. You will need a negative pregnancy test before starting this medication. Contraception is recommended while taking this medication and for 6 months after the  last dose. Your care team can help you find the option that works for you. Do not father a child while taking this medication and for 3 months after the last dose. Use a condom for contraception during this time period. Do not breastfeed while taking this medication and for 7 days after the last dose. This medication may cause infertility. Talk to your care team if you are concerned about your fertility. What side effects may I notice from receiving this medication? Side  effects that you should report to your care team as soon as possible: Allergic reactions--skin rash, itching, hives, swelling of the face, lips, tongue, or throat Dry cough, shortness of breath or trouble breathing Increased saliva or tears, increased sweating, stomach cramping, diarrhea, small pupils, unusual weakness or fatigue, slow heartbeat Infection--fever, chills, cough, sore throat, wounds that don't heal, pain or trouble when passing urine, general feeling of discomfort or being unwell Kidney injury--decrease in the amount of urine, swelling of the ankles, hands, or feet Low red blood cell level--unusual weakness or fatigue, dizziness, headache, trouble breathing Severe or prolonged diarrhea Unusual bruising or bleeding Side effects that usually do not require medical attention (report to your care team if they continue or are bothersome): Constipation Diarrhea Hair loss Loss of appetite Nausea Stomach pain This list may not describe all possible side effects. Call your doctor for medical advice about side effects. You may report side effects to FDA at 1-800-FDA-1088. Where should I keep my medication? This medication is given in a hospital or clinic. It will not be stored at home. NOTE: This sheet is a summary. It may not cover all possible information. If you have questions about this medicine, talk to your doctor, pharmacist, or health care provider.  2024 Elsevier/Gold Standard (2022-03-05 00:00:00)   Leucovorin Injection What is this medication? LEUCOVORIN (loo koe VOR in) prevents side effects from certain medications, such as methotrexate. It works by increasing folate levels. This helps protect healthy cells in your body. It may also be used to treat anemia caused by low levels of folate. It can also be used with fluorouracil, a type of chemotherapy, to treat colorectal cancer. It works by increasing the effects of fluorouracil in the body. This medicine may be used for  other purposes; ask your health care provider or pharmacist if you have questions. What should I tell my care team before I take this medication? They need to know if you have any of these conditions: Anemia from low levels of vitamin B12 in the blood An unusual or allergic reaction to leucovorin, folic acid, other medications, foods, dyes, or preservatives Pregnant or trying to get pregnant Breastfeeding How should I use this medication? This medication is injected into a vein or a muscle. It is given by your care team in a hospital or clinic setting. Talk to your care team about the use of this medication in children. Special care may be needed. Overdosage: If you think you have taken too much of this medicine contact a poison control center or emergency room at once. NOTE: This medicine is only for you. Do not share this medicine with others. What if I miss a dose? Keep appointments for follow-up doses. It is important not to miss your dose. Call your care team if you are unable to keep an appointment. What may interact with this medication? Capecitabine Fluorouracil Phenobarbital Phenytoin Primidone Trimethoprim;sulfamethoxazole This list may not describe all possible interactions. Give your health care provider a list of all  the medicines, herbs, non-prescription drugs, or dietary supplements you use. Also tell them if you smoke, drink alcohol, or use illegal drugs. Some items may interact with your medicine. What should I watch for while using this medication? Your condition will be monitored carefully while you are receiving this medication. This medication may increase the side effects of 5-fluorouracil. Tell your care team if you have diarrhea or mouth sores that do not get better or that get worse. What side effects may I notice from receiving this medication? Side effects that you should report to your care team as soon as possible: Allergic reactions--skin rash, itching, hives,  swelling of the face, lips, tongue, or throat This list may not describe all possible side effects. Call your doctor for medical advice about side effects. You may report side effects to FDA at 1-800-FDA-1088. Where should I keep my medication? This medication is given in a hospital or clinic. It will not be stored at home. NOTE: This sheet is a summary. It may not cover all possible information. If you have questions about this medicine, talk to your doctor, pharmacist, or health care provider.  2024 Elsevier/Gold Standard (2022-03-27 00:00:00)   Fluorouracil Injection What is this medication? FLUOROURACIL (flure oh YOOR a sil) treats some types of cancer. It works by slowing down the growth of cancer cells. This medicine may be used for other purposes; ask your health care provider or pharmacist if you have questions. COMMON BRAND NAME(S): Adrucil What should I tell my care team before I take this medication? They need to know if you have any of these conditions: Blood disorders Dihydropyrimidine dehydrogenase (DPD) deficiency Infection, such as chickenpox, cold sores, herpes Kidney disease Liver disease Poor nutrition Recent or ongoing radiation therapy An unusual or allergic reaction to fluorouracil, other medications, foods, dyes, or preservatives If you or your partner are pregnant or trying to get pregnant Breast-feeding How should I use this medication? This medication is injected into a vein. It is administered by your care team in a hospital or clinic setting. Talk to your care team about the use of this medication in children. Special care may be needed. Overdosage: If you think you have taken too much of this medicine contact a poison control center or emergency room at once. NOTE: This medicine is only for you. Do not share this medicine with others. What if I miss a dose? Keep appointments for follow-up doses. It is important not to miss your dose. Call your care team if  you are unable to keep an appointment. What may interact with this medication? Do not take this medication with any of the following: Live virus vaccines This medication may also interact with the following: Medications that treat or prevent blood clots, such as warfarin, enoxaparin, dalteparin This list may not describe all possible interactions. Give your health care provider a list of all the medicines, herbs, non-prescription drugs, or dietary supplements you use. Also tell them if you smoke, drink alcohol, or use illegal drugs. Some items may interact with your medicine. What should I watch for while using this medication? Your condition will be monitored carefully while you are receiving this medication. This medication may make you feel generally unwell. This is not uncommon as chemotherapy can affect healthy cells as well as cancer cells. Report any side effects. Continue your course of treatment even though you feel ill unless your care team tells you to stop. In some cases, you may be given additional medications to help  with side effects. Follow all directions for their use. This medication may increase your risk of getting an infection. Call your care team for advice if you get a fever, chills, sore throat, or other symptoms of a cold or flu. Do not treat yourself. Try to avoid being around people who are sick. This medication may increase your risk to bruise or bleed. Call your care team if you notice any unusual bleeding. Be careful brushing or flossing your teeth or using a toothpick because you may get an infection or bleed more easily. If you have any dental work done, tell your dentist you are receiving this medication. Avoid taking medications that contain aspirin, acetaminophen, ibuprofen, naproxen, or ketoprofen unless instructed by your care team. These medications may hide a fever. Do not treat diarrhea with over the counter products. Contact your care team if you have diarrhea  that lasts more than 2 days or if it is severe and watery. This medication can make you more sensitive to the sun. Keep out of the sun. If you cannot avoid being in the sun, wear protective clothing and sunscreen. Do not use sun lamps, tanning beds, or tanning booths. Talk to your care team if you or your partner wish to become pregnant or think you might be pregnant. This medication can cause serious birth defects if taken during pregnancy and for 3 months after the last dose. A reliable form of contraception is recommended while taking this medication and for 3 months after the last dose. Talk to your care team about effective forms of contraception. Do not father a child while taking this medication and for 3 months after the last dose. Use a condom while having sex during this time period. Do not breastfeed while taking this medication. This medication may cause infertility. Talk to your care team if you are concerned about your fertility. What side effects may I notice from receiving this medication? Side effects that you should report to your care team as soon as possible: Allergic reactions--skin rash, itching, hives, swelling of the face, lips, tongue, or throat Heart attack--pain or tightness in the chest, shoulders, arms, or jaw, nausea, shortness of breath, cold or clammy skin, feeling faint or lightheaded Heart failure--shortness of breath, swelling of the ankles, feet, or hands, sudden weight gain, unusual weakness or fatigue Heart rhythm changes--fast or irregular heartbeat, dizziness, feeling faint or lightheaded, chest pain, trouble breathing High ammonia level--unusual weakness or fatigue, confusion, loss of appetite, nausea, vomiting, seizures Infection--fever, chills, cough, sore throat, wounds that don't heal, pain or trouble when passing urine, general feeling of discomfort or being unwell Low red blood cell level--unusual weakness or fatigue, dizziness, headache, trouble  breathing Pain, tingling, or numbness in the hands or feet, muscle weakness, change in vision, confusion or trouble speaking, loss of balance or coordination, trouble walking, seizures Redness, swelling, and blistering of the skin over hands and feet Severe or prolonged diarrhea Unusual bruising or bleeding Side effects that usually do not require medical attention (report to your care team if they continue or are bothersome): Dry skin Headache Increased tears Nausea Pain, redness, or swelling with sores inside the mouth or throat Sensitivity to light Vomiting This list may not describe all possible side effects. Call your doctor for medical advice about side effects. You may report side effects to FDA at 1-800-FDA-1088. Where should I keep my medication? This medication is given in a hospital or clinic. It will not be stored at home. NOTE: This sheet  is a summary. It may not cover all possible information. If you have questions about this medicine, talk to your doctor, pharmacist, or health care provider.  2024 Elsevier/Gold Standard (2022-02-27 00:00:00)

## 2023-07-04 ENCOUNTER — Other Ambulatory Visit: Payer: Self-pay | Admitting: Oncology

## 2023-07-04 ENCOUNTER — Ambulatory Visit
Admission: RE | Admit: 2023-07-04 | Discharge: 2023-07-04 | Disposition: A | Discharge status: Home / Self Care | Payer: Medicare HMO | Source: Ambulatory Visit | Attending: Radiation Oncology | Admitting: Radiation Oncology

## 2023-07-04 ENCOUNTER — Ambulatory Visit: Payer: Medicare HMO

## 2023-07-04 ENCOUNTER — Other Ambulatory Visit: Payer: Self-pay | Admitting: *Deleted

## 2023-07-04 ENCOUNTER — Encounter: Payer: Self-pay | Admitting: Radiation Oncology

## 2023-07-04 ENCOUNTER — Inpatient Hospital Stay: Payer: Medicare HMO

## 2023-07-04 VITALS — BP 100/79 | HR 61 | Temp 97.2°F | Resp 18 | Ht 69.0 in | Wt 213.3 lb

## 2023-07-04 DIAGNOSIS — C61 Malignant neoplasm of prostate: Secondary | ICD-10-CM | POA: Diagnosis present

## 2023-07-04 DIAGNOSIS — C184 Malignant neoplasm of transverse colon: Secondary | ICD-10-CM

## 2023-07-04 DIAGNOSIS — R41841 Cognitive communication deficit: Secondary | ICD-10-CM

## 2023-07-04 DIAGNOSIS — R4701 Aphasia: Secondary | ICD-10-CM

## 2023-07-04 LAB — CEA: CEA: 148 ng/mL — ABNORMAL HIGH (ref 0.0–4.7)

## 2023-07-04 LAB — PREPARE RBC (CROSSMATCH)

## 2023-07-04 NOTE — Progress Notes (Signed)
Radiation Oncology Follow up Note  Name: CHIEF PERREAULT   Date:   07/04/2023 MRN:  [id number] DOB: July 30, 1956    This 67 y.o. 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  Patient is also under treatment for recurrent colon cancer currently on FOLFIRI  REFERRING PROVIDER: Marina Goodell, MD  HPI: Patient is a 67 year old male now out 1 month having completed IMRT radiation therapy for Gleason 7 adenocarcinoma the prostate.  He is also currently under treatment with FOLFIRI for recurrent colon cancer.  He states he is having some more liquid stools.  Although this can certainly be a side effect of FOLFIRI chemotherapy.  He states that this is improving somewhat.Marland Kitchen  He is also under observation for smoldering myeloma.  COMPLICATIONS OF TREATMENT: none  FOLLOW UP COMPLIANCE: keeps appointments   PHYSICAL EXAM:  BP 100/79   Pulse 61   Temp (!) 97.2 F (36.2 C)   Resp 18   Ht 5\' 9"  (1.753 m)   Wt 213 lb 4.8 oz (96.8 kg)   BMI 31.50 kg/m  Well-developed well-nourished patient in NAD. HEENT reveals PERLA, EOMI, discs not visualized.  Oral cavity is clear. No oral mucosal lesions are identified. Neck is clear without evidence of cervical or supraclavicular adenopathy. Lungs are clear to A&P. Cardiac examination is essentially unremarkable with regular rate and rhythm without murmur rub or thrill. Abdomen is benign with no organomegaly or masses noted. Motor sensory and DTR levels are equal and symmetric in the upper and lower extremities. Cranial nerves II through XII are grossly intact. Proprioception is intact. No peripheral adenopathy or edema is identified. No motor or sensory levels are noted. Crude visual fields are within normal range.  RADIOLOGY RESULTS: No current films for review  PLAN: Present time patient is doing fairly well.  He continues precautions  for diarrhea.  He continues on FOLFIRI chemotherapy under medical oncology's direction.  I have asked to see him back in 3 to months with a PSA.  Patient knows to call sooner with any concerns.  I would like to take this opportunity to thank you for allowing me to participate in the care of your patient.Carmina Miller, MD

## 2023-07-04 NOTE — Therapy (Signed)
OUTPATIENT SPEECH LANGUAGE PATHOLOGY APHASIA TREATMENT   Patient Name: Chris George MRN: [id number] DOB:February 20, 1956, 66 y.o., male Today's Date: 07/04/2023  PCP: Vallarie Mare REFERRING PROVIDER: Maryjane Hurter, MD   End of Session - 07/04/23 1018     Visit Number 15    Number of Visits 24    Date for SLP Re-Evaluation 07/11/23    SLP Start Time 0838    SLP Stop Time  0938    SLP Time Calculation (min) 60 min    Activity Tolerance Patient tolerated treatment well             Past Medical History:  Diagnosis Date   A-fib (HCC)    Anemia    Arthritis    ankles   Cancer (HCC)    multiple myeloma   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HCC) 10/26/2019   Cardiomyopathy (HCC)    Chemotherapy-induced peripheral neuropathy (HCC)    CHF (congestive heart failure) (HCC)    Depression    Dysrhythmia    atrial fib   History of CVA (cerebrovascular accident)    HLD (hyperlipidemia)    Hypercholesteremia    Hypertension    Moderate tricuspid insufficiency    Multiple myeloma (HCC)    not being treated right now per pt 11/11/19   Pneumonia    Sleep apnea    No CPAP.  OSA resolved with wt loss.   Past Surgical History:  Procedure Laterality Date   ATRIAL ABLATION SURGERY     CARDIAC SURGERY     A-Fib Ablations   COLONOSCOPY WITH PROPOFOL N/A 10/26/2019   Procedure: COLONOSCOPY WITH BIOPSY;  Surgeon: [name], MD;  Location: [facility];  Service: Endoscopy;  Laterality: N/A;   COLONOSCOPY WITH PROPOFOL N/A 09/03/2022   Procedure: COLONOSCOPY WITH PROPOFOL;  Surgeon: [name], MD;  Location: [facility];  Service: Endoscopy;  Laterality: N/A;   ESOPHAGOGASTRODUODENOSCOPY (EGD) WITH PROPOFOL N/A 10/26/2019   Procedure: ESOPHAGOGASTRODUODENOSCOPY (EGD) WITH BIOPSY;  Surgeon: [name], MD;  Location: [facility];  Service: Endoscopy;  Laterality: N/A;  sleep apnea   LAPAROSCOPIC PARTIAL COLECTOMY Right 11/17/2019   Procedure: LAPAROSCOPIC PARTIAL  COLECTOMY RIGHT EXTENDED;  Surgeon: [name], MD;  Location: [facility];  Service: General;  Laterality: Right;   PORTACATH PLACEMENT N/A 12/28/2019   Procedure: INSERTION PORT-A-CATH Left subclavian;  Surgeon: [name], MD;  Location: [facility];  Service: General;  Laterality: N/A;   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   Diabetes mellitus type 2, controlled, without complications (HCC) 08/07/2021   Neuropathy 08/30/2020   Port-A-Cath in place    Abnormal ECG 11/04/2019   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HCC) 10/27/2019   Goals of care, counseling/discussion 05/18/2019   Multiple myeloma (HCC) 05/17/2019   Anemia 05/12/2019   Arthritis of left ankle 02/05/2018   Cardiomyopathy, idiopathic (HCC) 01/07/2018   Moderate tricuspid insufficiency 01/01/2017   OSA (obstructive sleep apnea) 01/01/2017   Chronic a-fib (HCC) 12/06/2015   Pure hypercholesterolemia 02/09/2015   Anticoagulant long-term use 04/29/2014   Hyperlipidemia 04/29/2014   Hypertension, essential, benign 04/26/2014    ONSET DATE: 04/05/23  REFERRING DIAG: aphasia  THERAPY DIAG:  Cognitive communication deficit  Aphasia  Rationale for Evaluation and Treatment Rehabilitation  SUBJECTIVE:   SUBJECTIVE STATEMENT: Pt alert, pleasant, and cooperative. Reports feeling "much better" than last session.   Pt accompanied  by: self  PERTINENT HISTORY: Patient is a 67 y.o. 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 Pt with PMHx of ca of the transverse colon, multiple myeloma, lymphoma, DM, HTN, HLD, CKD, and recently diagnosed prostate cancer. Pt undergoing radiation tx for cancer and reports plan to possibly resume chemotherapy.   DIAGNOSTIC FINDINGS:  MRI brain 04/06/23, "MRI brain findings-Acute infarct in the left  parietal lobe with small focus of infarct in the right temporal lobe (5:41, 5:38) evident on diffusion-weighted imaging and FLAIR sequences. A few nodular foci of low signal on susceptibility weighted imaging in the region of left parietal infarct (for example 17:46) may represent small areas of microhemorrhage or cortical vessel thrombus.Mild cerebral volume loss with ex vacuo dilatation of the CSF-containing spaces. There are scattered in confluent foci of signal abnormality within the periventricular and deep white matter.  These are nonspecific but commonly seen with small vessel ischemic changes. There is no midline shift. No extra-axial fluid collection. No mass. There is no abnormal enhancement."  PAIN:  Are you having pain? No  FALLS: Has patient fallen in last 6 months?  No  LIVING ENVIRONMENT: Lives with: lives with their family since stroke; pt lived alone prior to stroke Lives in: House/apartment  PLOF:  Level of assistance: Independent with ADLs, Comment: prior to stroke; now reports needing assistance with iADLs  Employment: Retired   PATIENT GOALS    to be able to participate in complex conversation with family and friends; to understanding what he is reading; to improve QoL; to be more independent   OBJECTIVE:  TODAY'S TREATMENT:  Skilled SLP treatment provided targeting expressive communication. Pt endorsed having ~90 minute conversation via telephone with [employer] to address phone/internet issues yesterday. Pt reported successful communication during lengthy conversation as well as self-advocacy re: current communication difficulty at beginning of telephone call. 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 Revisions and hesitates noted with successful repair of communication breakdowns for either anomia or apraxia of speech ~85% of the time. Structured speech task - debating pros/cons of current topics  - completed with extra time and moderate cueing to provide both a pro/con. Mildly increased difficulty with use of synonym/circumlocution strategy this date. Supportive counseling provided re: current speech/language deficits, communication strategies, effects of physical health on cognitive-linguistic functioning, and progress to date. Pt appreciative of SLP for listening.     PATIENT EDUCATION: Education details: as above Person educated: Patient Education method: Explanation Education comprehension: verbalized understanding; needs further reinforcement  HOME EXERCISE PROGRAM:   Additional worksheets provided for home practice    GOALS:  Goals reviewed with patient? Yes  SHORT TERM GOALS: Target date: 10 sessions; revised goal - additional 10 weeks  Patient will demonstrate improved expressive language skills during communication breakdowns by using multimodal means (semantic feature analysis, circumlocution, synonym/antonym use, gestures, writing, drawing) to repair with mod cues. Baseline: Goal status: MET  2.  With Moderate A, patient will complete a semantic feature analysis with at least 3-5 relevant features for 8/10 target words with cues to improve word-finding skills.  Baseline:  Goal status: MET  3.  With min A, patient will generate sentences with 5 or more words during structured task at 80% accuracy in order to increase ability to communicate basic wants and needs.  Baseline:  Goal status: REVISED  4.  Pt will participate in further assessment of functional reading and writing to help  determine level of assistance needed for language based iADL tasks. Baseline:  Goal status: MET  5.  Patient will follow two step commands 70% acc in context of a functional activity.  Baseline:  Goal status: MET  **New goals added 06/03/23; target date: 10 weeks   6. Pt will co-create x2 scripts for improved participation in medical appointments and                  social  events with moderate assistance.               7. Pt will create simple text messages to desired communication partners with min A.   LONG TERM GOALS: Target date: 12 weeks  Patient will demonstrate knowledge of appropriate activities to support functional receptive and expressive language outside of ST with assistance from family.  Baseline:  Goal status: IN PROGRESS  2.  Patient will report improved confidence in conversations outside of home (appointments, friends, on the phone) than prior to ST, as measured by Communication Effectiveness Survey.  Baseline:  15/32 Goal status: IN PROGRESS; will complete prior to last session  3.  Patient and/or family will report use of strategies outside of ST to improve communication (use of scripts, pre-planning, semantic feature analysis, supported conversation).  Baseline:  Goal status: IN PROGRESS  4.  Patient will demonstrate improved ability with describing events, storytelling, or sharing medical information through personalized strategies (requesting time, using SFA, drawing, etc) as measured by Communication Effectiveness Survey.  Baseline:  Goal status: IN PROGRESS   ASSESSMENT:  CLINICAL IMPRESSION: Patient is a 67 y.o. 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 Pt making good progress. 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 See details of tx session above.  I recommend continued course of skilled SLP services targeting functional receptive and expressive communication to improve pt's participation in iADLs, preferred activities, and QoL.   OBJECTIVE IMPAIRMENTS include expressive language, receptive language, and aphasia. These impairments are limiting patient from managing medications, managing appointments, managing finances, household responsibilities,  ADLs/IADLs, and effectively communicating at home and in community. Factors affecting potential to achieve goals and functional outcome are co-morbidities. Patient will benefit from skilled SLP services to address above impairments and improve overall function.  REHAB POTENTIAL: Excellent  PLAN: SLP FREQUENCY: 2x/week  SLP DURATION: 12 weeks  PLANNED INTERVENTIONS: Cueing hierachy, Internal/external aids, Functional tasks, Multimodal communication approach, SLP instruction and feedback, Compensatory strategies, and Patient/family education    Clyde Canterbury, M.S., CCC-SLP Speech-Language Pathologist  [facility] [address] [place], [place], [address] Phone: [phone]   Fax:  [phone]

## 2023-07-05 ENCOUNTER — Inpatient Hospital Stay: Payer: Medicare HMO

## 2023-07-05 ENCOUNTER — Other Ambulatory Visit: Payer: Self-pay

## 2023-07-05 VITALS — BP 98/69 | HR 60 | Temp 98.2°F | Resp 18

## 2023-07-05 DIAGNOSIS — C184 Malignant neoplasm of transverse colon: Secondary | ICD-10-CM

## 2023-07-05 DIAGNOSIS — Z5111 Encounter for antineoplastic chemotherapy: Secondary | ICD-10-CM | POA: Diagnosis not present

## 2023-07-05 MED ORDER — HEPARIN SOD (PORK) LOCK FLUSH 100 UNIT/ML IV SOLN
500.0000 [IU] | Freq: Once | INTRAVENOUS | Status: AC | PRN
  Administered 2023-07-05: 500 [IU]
  Filled 2023-07-05: qty 5

## 2023-07-05 MED ORDER — PEGFILGRASTIM-JMDB 6 MG/0.6ML ~~LOC~~ SOSY
6.0000 mg | PREFILLED_SYRINGE | Freq: Once | SUBCUTANEOUS | Status: AC
  Administered 2023-07-05: 6 mg via SUBCUTANEOUS
  Filled 2023-07-05: qty 0.6

## 2023-07-05 MED ORDER — ACETAMINOPHEN 325 MG PO TABS
650.0000 mg | ORAL_TABLET | Freq: Once | ORAL | Status: AC
  Administered 2023-07-05: 650 mg via ORAL
  Filled 2023-07-05: qty 2

## 2023-07-05 MED ORDER — DIPHENHYDRAMINE HCL 50 MG/ML IJ SOLN
25.0000 mg | Freq: Once | INTRAMUSCULAR | Status: AC
  Administered 2023-07-05: 25 mg via INTRAVENOUS
  Filled 2023-07-05: qty 1

## 2023-07-05 MED ORDER — SODIUM CHLORIDE 0.9% IV SOLUTION
250.0000 mL | Freq: Once | INTRAVENOUS | Status: AC
  Administered 2023-07-05: 250 mL via INTRAVENOUS
  Filled 2023-07-05: qty 250

## 2023-07-05 NOTE — Patient Instructions (Signed)

## 2023-07-06 LAB — TYPE AND SCREEN
ABO/RH(D): O POS
Antibody Screen: NEGATIVE
Unit division: 0

## 2023-07-06 LAB — BPAM RBC
Blood Product Expiration Date: 202409132359
ISSUE DATE / TIME: 202408301316
Unit Type and Rh: 5100

## 2023-07-09 ENCOUNTER — Other Ambulatory Visit: Payer: Self-pay | Admitting: *Deleted

## 2023-07-09 ENCOUNTER — Inpatient Hospital Stay (HOSPITAL_BASED_OUTPATIENT_CLINIC_OR_DEPARTMENT_OTHER): Payer: Medicare HMO | Attending: Oncology | Admitting: Nurse Practitioner

## 2023-07-09 ENCOUNTER — Ambulatory Visit: Payer: Medicare HMO | Attending: Family Medicine

## 2023-07-09 ENCOUNTER — Inpatient Hospital Stay: Payer: Medicare HMO | Attending: Oncology

## 2023-07-09 ENCOUNTER — Inpatient Hospital Stay: Payer: Medicare HMO

## 2023-07-09 ENCOUNTER — Other Ambulatory Visit: Payer: Self-pay

## 2023-07-09 VITALS — BP 118/70

## 2023-07-09 VITALS — BP 93/58 | HR 52 | Temp 97.0°F | Resp 18 | Ht 69.0 in | Wt 205.0 lb

## 2023-07-09 DIAGNOSIS — Z79899 Other long term (current) drug therapy: Secondary | ICD-10-CM | POA: Insufficient documentation

## 2023-07-09 DIAGNOSIS — Z5189 Encounter for other specified aftercare: Secondary | ICD-10-CM | POA: Insufficient documentation

## 2023-07-09 DIAGNOSIS — K521 Toxic gastroenteritis and colitis: Secondary | ICD-10-CM

## 2023-07-09 DIAGNOSIS — C184 Malignant neoplasm of transverse colon: Secondary | ICD-10-CM | POA: Insufficient documentation

## 2023-07-09 DIAGNOSIS — R4701 Aphasia: Secondary | ICD-10-CM | POA: Insufficient documentation

## 2023-07-09 DIAGNOSIS — I959 Hypotension, unspecified: Secondary | ICD-10-CM | POA: Insufficient documentation

## 2023-07-09 DIAGNOSIS — D649 Anemia, unspecified: Secondary | ICD-10-CM

## 2023-07-09 DIAGNOSIS — C182 Malignant neoplasm of ascending colon: Secondary | ICD-10-CM | POA: Insufficient documentation

## 2023-07-09 DIAGNOSIS — Z95828 Presence of other vascular implants and grafts: Secondary | ICD-10-CM

## 2023-07-09 DIAGNOSIS — Z5111 Encounter for antineoplastic chemotherapy: Secondary | ICD-10-CM | POA: Diagnosis present

## 2023-07-09 DIAGNOSIS — R482 Apraxia: Secondary | ICD-10-CM | POA: Insufficient documentation

## 2023-07-09 DIAGNOSIS — D509 Iron deficiency anemia, unspecified: Secondary | ICD-10-CM

## 2023-07-09 DIAGNOSIS — R41841 Cognitive communication deficit: Secondary | ICD-10-CM | POA: Insufficient documentation

## 2023-07-09 DIAGNOSIS — T451X5A Adverse effect of antineoplastic and immunosuppressive drugs, initial encounter: Secondary | ICD-10-CM

## 2023-07-09 LAB — CBC WITH DIFFERENTIAL/PLATELET
Abs Immature Granulocytes: 0.13 10*3/uL — ABNORMAL HIGH (ref 0.00–0.07)
Basophils Absolute: 0 10*3/uL (ref 0.0–0.1)
Basophils Relative: 0 %
Eosinophils Absolute: 0 10*3/uL (ref 0.0–0.5)
Eosinophils Relative: 1 %
HCT: 26.1 % — ABNORMAL LOW (ref 39.0–52.0)
Hemoglobin: 8.3 g/dL — ABNORMAL LOW (ref 13.0–17.0)
Immature Granulocytes: 3 %
Lymphocytes Relative: 4 %
Lymphs Abs: 0.2 10*3/uL — ABNORMAL LOW (ref 0.7–4.0)
MCH: 28.4 pg (ref 26.0–34.0)
MCHC: 31.8 g/dL (ref 30.0–36.0)
MCV: 89.4 fL (ref 80.0–100.0)
Monocytes Absolute: 0.2 10*3/uL (ref 0.1–1.0)
Monocytes Relative: 5 %
Neutro Abs: 4.4 10*3/uL (ref 1.7–7.7)
Neutrophils Relative %: 87 %
Platelets: 64 10*3/uL — ABNORMAL LOW (ref 150–400)
RBC: 2.92 MIL/uL — ABNORMAL LOW (ref 4.22–5.81)
RDW: 18.9 % — ABNORMAL HIGH (ref 11.5–15.5)
Smear Review: NORMAL
WBC: 5.1 10*3/uL (ref 4.0–10.5)
nRBC: 0 % (ref 0.0–0.2)

## 2023-07-09 LAB — PREPARE RBC (CROSSMATCH)

## 2023-07-09 LAB — CMP (CANCER CENTER ONLY)
ALT: 16 U/L (ref 0–44)
AST: 16 U/L (ref 15–41)
Albumin: 3.3 g/dL — ABNORMAL LOW (ref 3.5–5.0)
Alkaline Phosphatase: 155 U/L — ABNORMAL HIGH (ref 38–126)
Anion gap: 6 (ref 5–15)
BUN: 34 mg/dL — ABNORMAL HIGH (ref 8–23)
CO2: 23 mmol/L (ref 22–32)
Calcium: 8.1 mg/dL — ABNORMAL LOW (ref 8.9–10.3)
Chloride: 103 mmol/L (ref 98–111)
Creatinine: 1.58 mg/dL — ABNORMAL HIGH (ref 0.61–1.24)
GFR, Estimated: 48 mL/min — ABNORMAL LOW (ref >60)
Glucose, Bld: 116 mg/dL — ABNORMAL HIGH (ref 70–99)
Potassium: 3.8 mmol/L (ref 3.5–5.1)
Sodium: 132 mmol/L — ABNORMAL LOW (ref 135–145)
Total Bilirubin: 1.3 mg/dL — ABNORMAL HIGH (ref 0.3–1.2)
Total Protein: 6.7 g/dL (ref 6.5–8.1)

## 2023-07-09 LAB — IRON AND TIBC
Iron: 124 ug/dL (ref 45–182)
Saturation Ratios: 43 % — ABNORMAL HIGH (ref 17.9–39.5)
TIBC: 288 ug/dL (ref 250–450)
UIBC: 164 ug/dL

## 2023-07-09 LAB — MAGNESIUM: Magnesium: 1.8 mg/dL (ref 1.7–2.4)

## 2023-07-09 LAB — FERRITIN: Ferritin: 616 ng/mL — ABNORMAL HIGH (ref 24–336)

## 2023-07-09 MED ORDER — SODIUM CHLORIDE 0.9 % IV SOLN
Freq: Once | INTRAVENOUS | Status: AC
  Filled 2023-07-09: qty 250

## 2023-07-09 MED ORDER — SODIUM CHLORIDE 0.9% FLUSH
10.0000 mL | Freq: Once | INTRAVENOUS | Status: AC
  Administered 2023-07-09: 10 mL via INTRAVENOUS
  Filled 2023-07-09: qty 10

## 2023-07-09 MED ORDER — HEPARIN SOD (PORK) LOCK FLUSH 100 UNIT/ML IV SOLN
500.0000 [IU] | Freq: Once | INTRAVENOUS | Status: AC | PRN
  Administered 2023-07-09: 500 [IU]
  Filled 2023-07-09: qty 5

## 2023-07-09 NOTE — Progress Notes (Signed)
Symptom Management Clinic  [facility] A Department of the [place]. [facility] [address], [address] [place], [place] [address] [phone] (phone) [phone] (fax)  Patient Care Team: Marina Goodell, MD as PCP - General (Family Medicine) [name], MD as Consulting Physician (Oncology) Morene Crocker, MD as Referring Physician (Neurology) Lamar Blinks, MD as Consulting Physician (Cardiology)   Name of the patient: Chris George  [id number]  08-29-1956   Date of visit: 07/09/23  Diagnosis- recurrent colon cancer  Chief complaint/ Reason for visit- anemia  Heme/Onc history:  Oncology History  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HCC)  10/27/2019 Initial Diagnosis   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HCC)   12/01/2019 Cancer Staging   Staging form: Colon and Rectum, AJCC 8th Edition - Clinical stage from 12/01/2019: Stage IIIC (cT3, cN2b, cM0) - Signed by [name], MD on 12/01/2019   12/30/2019 - 06/03/2020 Chemotherapy   Patient is on Treatment Plan : COLORECTAL FOLFOX q14d x 6 months     05/15/2023 -  Chemotherapy   Patient is on Treatment Plan : COLORECTAL FOLFIRI q14d       Interval history- Patient is 66 year old male, diagnosed with recurrent colon cancer who presents to [facility] for complaints of diarrhea and generalized weakness. He reports dizziness when changing positions and shortness of breath with exertion. He has not taken anything for his diarrhea. Has history of hemorrhoids and reports rectal bleeding, bright red, and rectal pain.   Review of systems- Review of Systems  Constitutional:  Positive for malaise/fatigue. Negative for chills, fever and weight loss.  HENT:  Negative for hearing loss, nosebleeds and tinnitus.   Respiratory:  Negative for cough, hemoptysis, shortness of breath and wheezing.   Cardiovascular:  Negative for chest pain, palpitations and leg swelling.   Gastrointestinal:  Positive for blood in stool and diarrhea. Negative for abdominal pain, constipation, melena, nausea and vomiting.  Genitourinary:  Negative for dysuria, hematuria and urgency.  Musculoskeletal:  Negative for back pain, falls, joint pain and myalgias.  Skin:  Negative for itching and rash.  Neurological:  Positive for weakness. Negative for dizziness, tingling, sensory change, loss of consciousness and headaches.  Endo/Heme/Allergies:  Negative for environmental allergies. Does not bruise/bleed easily.  Psychiatric/Behavioral:  Negative for depression. The patient is not nervous/anxious and does not have insomnia.     Current treatment- FOLFIRI chemotherapy s/p cycle 4   No Known Allergies  Past Medical History:  Diagnosis Date   A-fib (HCC)    Anemia    Arthritis    ankles   Cancer (HCC)    multiple myeloma   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HCC) 10/26/2019   Cardiomyopathy (HCC)    Chemotherapy-induced peripheral neuropathy (HCC)    CHF (congestive heart failure) (HCC)    Depression    Dysrhythmia    atrial fib   History of CVA (cerebrovascular accident)    HLD (hyperlipidemia)    Hypercholesteremia    Hypertension    Moderate tricuspid insufficiency    Multiple myeloma (HCC)    not being treated right now per pt 11/11/19   Pneumonia    Sleep apnea    No CPAP.  OSA resolved with wt loss.    Past Surgical History:  Procedure Laterality Date   ATRIAL ABLATION SURGERY     CARDIAC SURGERY     A-Fib Ablations   COLONOSCOPY WITH PROPOFOL N/A 10/26/2019   Procedure: COLONOSCOPY  WITH BIOPSY;  Surgeon: [name], MD;  Location: [facility];  Service: Endoscopy;  Laterality: N/A;   COLONOSCOPY WITH PROPOFOL N/A 09/03/2022   Procedure: COLONOSCOPY WITH PROPOFOL;  Surgeon: [name], MD;  Location: [facility];  Service: Endoscopy;  Laterality: N/A;   ESOPHAGOGASTRODUODENOSCOPY (EGD) WITH PROPOFOL N/A 10/26/2019   Procedure:  ESOPHAGOGASTRODUODENOSCOPY (EGD) WITH BIOPSY;  Surgeon: [name], MD;  Location: [facility];  Service: Endoscopy;  Laterality: N/A;  sleep apnea   LAPAROSCOPIC PARTIAL COLECTOMY Right 11/17/2019   Procedure: LAPAROSCOPIC PARTIAL COLECTOMY RIGHT EXTENDED;  Surgeon: [name], MD;  Location: [facility];  Service: General;  Laterality: Right;   PORTACATH PLACEMENT N/A 12/28/2019   Procedure: INSERTION PORT-A-CATH Left subclavian;  Surgeon: [name], MD;  Location: [facility];  Service: General;  Laterality: N/A;    Social History   Socioeconomic History   Marital status: Single    Spouse name: Not on file   Number of children: Not on file   Years of education: Not on file   Highest education level: Not on file  Occupational History   Not on file  Tobacco Use   Smoking status: Never    Passive exposure: Never   Smokeless tobacco: Never  Vaping Use   Vaping status: Never Used  Substance and Sexual Activity   Alcohol use: Not Currently   Drug use: Never   Sexual activity: Yes    Birth control/protection: None  Other Topics Concern   Not on file  Social History Narrative   Not on file   Social Determinants of Health   Financial Resource Strain: Low Risk  (05/06/2023)   Received from [facility], [facility]   Overall Financial Resource Strain (CARDIA)    Difficulty of Paying Living Expenses: Not very hard  Food Insecurity: No Food Insecurity (05/06/2023)   Received from [facility], [facility]   Hunger Vital Sign    Worried About Running Out of Food in the Last Year: Never true    Ran Out of Food in the Last Year: Never true  Transportation Needs: No Transportation Needs (05/06/2023)   Received from [facility], [facility]   [facility] - Transportation    In the past 12 months, has lack of transportation kept you from medical appointments or from getting  medications?: No    Lack of Transportation (Non-Medical): No  Physical Activity: Inactive (05/28/2019)   Exercise Vital Sign    Days of Exercise per Week: 0 days    Minutes of Exercise per Session: 0 min  Stress: Stress Concern Present (05/28/2019)   [employer] of Occupational Health - Occupational Stress Questionnaire    Feeling of Stress : To some extent  Social Connections: Moderately Integrated (05/28/2019)   Social Connection and Isolation Panel [NHANES]    Frequency of Communication with Friends and Family: More than three times a week    Frequency of Social Gatherings with Friends and Family: More than three times a week    Attends Religious Services: More than 4 times per year    Active Member of [employer] or Organizations: Yes    Attends Banker Meetings: More than 4 times per year    Marital Status: Divorced  Intimate Partner Violence: Not At Risk (05/28/2019)   Humiliation, Afraid, Rape, and Kick questionnaire    Fear of Current or Ex-Partner: No    Emotionally Abused: No    Physically  Abused: No    Sexually Abused: No    Family History  Problem Relation Age of Onset   Healthy Mother     Current Outpatient Medications:    allopurinol (ZYLOPRIM) 100 MG tablet, Take 200 mg by mouth daily., Disp: , Rfl:    apixaban (ELIQUIS) 5 MG TABS tablet, Take 5 mg by mouth 2 (two) times daily., Disp: , Rfl:    APPLE CIDER VINEGAR PO, Take 30-45 mLs by mouth every other day. , Disp: , Rfl:    atorvastatin (LIPITOR) 80 MG tablet, Take 80 mg by mouth daily., Disp: , Rfl:    colchicine 0.6 MG tablet, Take 1 tablet (0.6 mg total) by mouth 2 (two) times daily. Until flare resolves., Disp: 20 tablet, Rfl: 1   furosemide (LASIX) 80 MG tablet, Take 40 mg by mouth daily. Morning & late afternoon, Disp: , Rfl:    hydrocortisone (ANUSOL-HC) 2.5 % rectal cream, Place 1 Application rectally 2 (two) times daily., Disp: 30 g, Rfl: 0   hydrocortisone cream 1 %, Apply 1 Application  topically 2 (two) times daily., Disp: , Rfl:    MAGNESIUM PO, Take by mouth., Disp: , Rfl:    Misc Natural Products (TART CHERRY ADVANCED PO), Take 60 mLs by mouth every other day., Disp: , Rfl:    Multiple Vitamins-Minerals (ZINC PO), Take by mouth., Disp: , Rfl:    potassium bicarbonate (K-LYTE) 25 MEQ disintegrating tablet, Take 25 mEq by mouth 2 (two) times daily. In the afternoon., Disp: , Rfl:    tamsulosin (FLOMAX) 0.4 MG CAPS capsule, Take 1 capsule (0.4 mg total) by mouth daily after supper., Disp: 30 capsule, Rfl: 6   traZODone (DESYREL) 50 MG tablet, Take 1 tablet (50 mg total) by mouth at bedtime. Use caution if used with the norco pain medication, Disp: 30 tablet, Rfl: 2   Zinc Sulfate (ZINC 15 PO), Take by mouth., Disp: , Rfl:    HYDROcodone-acetaminophen (NORCO) 5-325 MG tablet, Take 1 tablet by mouth every 6 (six) hours as needed for moderate pain. (Patient not taking: Reported on 07/09/2023), Disp: 30 tablet, Rfl: 0   lidocaine-prilocaine (EMLA) cream, Apply 1 Application topically daily. (Patient not taking: Reported on 07/09/2023), Disp: , Rfl:    loperamide (IMODIUM A-D) 2 MG tablet, Take 2 mg by mouth 4 (four) times daily as needed for diarrhea or loose stools. (Patient not taking: Reported on 07/09/2023), Disp: , Rfl:    prochlorperazine (COMPAZINE) 10 MG tablet, Take 1 tablet (10 mg total) by mouth every 6 (six) hours as needed for nausea or vomiting. (Patient not taking: Reported on 06/28/2023), Disp: 60 tablet, Rfl: 1 No current facility-administered medications for this visit.  Facility-Administered Medications Ordered in Other Visits:    heparin lock flush 100 unit/mL, 500 Units, Intravenous, Once, [name], MD  Physical exam:  Vitals:   07/09/23 1430  BP: (!) 93/58  Pulse: (!) 52  Resp: 18  Temp: (!) 97 F (36.1 C)  TempSrc: Tympanic  SpO2: 100%  Weight: 205 lb (93 kg)  Height: 5\' 9"  (1.753 m)   Physical Exam Vitals reviewed.  Constitutional:       Appearance: He is not ill-appearing.     Comments: Fatigued appearing  Cardiovascular:     Rate and Rhythm: Normal rate and regular rhythm.  Pulmonary:     Effort: Pulmonary effort is normal. No respiratory distress.  Abdominal:     General: There is no distension.     Tenderness: There is  no abdominal tenderness.  Musculoskeletal:     Right lower leg: No edema.     Left lower leg: No edema.     Comments: wheelchair  Skin:    General: Skin is warm and dry.     Coloration: Skin is pale.  Neurological:     Mental Status: He is alert and oriented to person, place, and time.  Psychiatric:        Mood and Affect: Mood normal.        Behavior: Behavior normal.        Latest Ref Rng & Units 07/09/2023    2:26 PM  CMP  Glucose 70 - 99 mg/dL 469   BUN 8 - 23 mg/dL 34   Creatinine 6.29 - 1.24 mg/dL 5.28   Sodium 413 - 244 mmol/L 132   Potassium 3.5 - 5.1 mmol/L 3.8   Chloride 98 - 111 mmol/L 103   CO2 22 - 32 mmol/L 23   Calcium 8.9 - 10.3 mg/dL 8.1   Total Protein 6.5 - 8.1 g/dL 6.7   Total Bilirubin 0.3 - 1.2 mg/dL 1.3   Alkaline Phos 38 - 126 U/L 155   AST 15 - 41 U/L 16   ALT 0 - 44 U/L 16       Latest Ref Rng & Units 07/09/2023    2:26 PM  CBC  WBC 4.0 - 10.5 K/uL 5.1   Hemoglobin 13.0 - 17.0 g/dL 8.3   Hematocrit 01.0 - 52.0 % 26.1   Platelets 150 - 400 K/uL 64    No results found.  Assessment and plan- Patient is a 67 y.o. male who presents for acute management of   Diarrhea- likely secondary to chemotherapy but previous stool studies were entered but not collected. If patient has stool specimen we can process that to verify. I recommend starting imodium 2 tablets with first loose stool then 1 additional tablet with each subsequent loose stool. If ongoing symptoms, we can start lomotil. Given history of hemorrhoids he is hesitant to induce constipation which is understandable.  Anemia- no evidence of iron deficiency which is surprising given reports of rectal bleeding.  He is s/p 1 unit pRBCs Friday. Hemoglobin 8.3 today. Symptomatic and he has not yet reached nadir post chemotherapy. Therefore, I recommend additional unit of blood. He is in agreement.  REnal insufficiency- hx of ckd. Likely contributing to anemia. GFR 48. Stable to slightly improved.  Dizziness- likely related to orthostasis and soft blood pressure as well as anemia. I've asked him to hold his lasix for now.  Rectal pain- hx of internal hemorrhoids. He did not previously have any rectal masses on imaging so I question if pain is inflammation or worsening of these internal hemorrhoids. Plan to reevaluate after improvement in his diarrhea. If ongoing symptoms, reach out to GI, Dr Servando Snare, for evaluation. Continue topical hydrocortisone.  Hypocalcemia- corrected calcium 8.3. Start extra strength tums daily or otc calcium supplement.  Recurrent colon cancer- completed 9 cycles of adjuvant folfox in 06/03/20. D/c d/t declining PS and side effects. Recurrence on imaging and rising CEA in 2024. Currently s/p cycle 4 of FOLFIRI chemotherapy. Avastin held d/t stroke hx.  Hx of stroke- on eliquis.    Disposition:  Fluids today RTC on Thursday for 1 unit pRBCs Follow up with Dr. Orlie Dakin as scheduled or sooner if symptoms don't improve or worsen.   Visit Diagnosis 1. Symptomatic anemia   2. Convalescence following chemotherapy   3. Chemotherapy induced diarrhea  4. Hypocalcemia     Patient expressed understanding and was in agreement with this plan. He also understands that He can call clinic at any time with any questions, concerns, or complaints.   Thank you for allowing me to participate in the care of this very pleasant patient.   Consuello Masse, DNP, AGNP-C, AOCNP [facility] [phone]  Addendum: 07/12/23- spoke to patient by phone. He is feeling stronger and has improved shortness of breath since blood transfusion. No complaints.

## 2023-07-09 NOTE — Therapy (Signed)
OUTPATIENT SPEECH LANGUAGE PATHOLOGY APHASIA TREATMENT   Patient Name: Chris George MRN: [id number] DOB:1956-01-27, 67 y.o., male Today's Date: 07/09/2023  PCP: Vallarie Mare REFERRING PROVIDER: Maryjane Hurter, MD   End of Session - 07/09/23 1352     Visit Number 16    Number of Visits 24    Date for SLP Re-Evaluation 07/11/23    SLP Start Time 1310    SLP Stop Time  1340    SLP Time Calculation (min) 30 min    Activity Tolerance Patient tolerated treatment well             Past Medical History:  Diagnosis Date   A-fib (HCC)    Anemia    Arthritis    ankles   Cancer (HCC)    multiple myeloma   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HCC) 10/26/2019   Cardiomyopathy (HCC)    Chemotherapy-induced peripheral neuropathy (HCC)    CHF (congestive heart failure) (HCC)    Depression    Dysrhythmia    atrial fib   History of CVA (cerebrovascular accident)    HLD (hyperlipidemia)    Hypercholesteremia    Hypertension    Moderate tricuspid insufficiency    Multiple myeloma (HCC)    not being treated right now per pt 11/11/19   Pneumonia    Sleep apnea    No CPAP.  OSA resolved with wt loss.   Past Surgical History:  Procedure Laterality Date   ATRIAL ABLATION SURGERY     CARDIAC SURGERY     A-Fib Ablations   COLONOSCOPY WITH PROPOFOL N/A 10/26/2019   Procedure: COLONOSCOPY WITH BIOPSY;  Surgeon: [name], MD;  Location: [facility];  Service: Endoscopy;  Laterality: N/A;   COLONOSCOPY WITH PROPOFOL N/A 09/03/2022   Procedure: COLONOSCOPY WITH PROPOFOL;  Surgeon: [name], MD;  Location: [facility];  Service: Endoscopy;  Laterality: N/A;   ESOPHAGOGASTRODUODENOSCOPY (EGD) WITH PROPOFOL N/A 10/26/2019   Procedure: ESOPHAGOGASTRODUODENOSCOPY (EGD) WITH BIOPSY;  Surgeon: [name], MD;  Location: [facility];  Service: Endoscopy;  Laterality: N/A;  sleep apnea   LAPAROSCOPIC PARTIAL COLECTOMY Right 11/17/2019   Procedure: LAPAROSCOPIC PARTIAL COLECTOMY  RIGHT EXTENDED;  Surgeon: [name], MD;  Location: [facility];  Service: General;  Laterality: Right;   PORTACATH PLACEMENT N/A 12/28/2019   Procedure: INSERTION PORT-A-CATH Left subclavian;  Surgeon: [name], MD;  Location: [facility];  Service: General;  Laterality: N/A;   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   Diabetes mellitus type 2, controlled, without complications (HCC) 08/07/2021   Neuropathy 08/30/2020   Port-A-Cath in place    Abnormal ECG 11/04/2019   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HCC) 10/27/2019   Goals of care, counseling/discussion 05/18/2019   Multiple myeloma (HCC) 05/17/2019   Anemia 05/12/2019   Arthritis of left ankle 02/05/2018   Cardiomyopathy, idiopathic (HCC) 01/07/2018   Moderate tricuspid insufficiency 01/01/2017   OSA (obstructive sleep apnea) 01/01/2017   Chronic a-fib (HCC) 12/06/2015   Pure hypercholesterolemia 02/09/2015   Anticoagulant long-term use 04/29/2014   Hyperlipidemia 04/29/2014   Hypertension, essential, benign 04/26/2014    ONSET DATE: 04/05/23  REFERRING DIAG: aphasia  THERAPY DIAG:  Cognitive communication deficit  Aphasia  Rationale for Evaluation and Treatment Rehabilitation  SUBJECTIVE:   SUBJECTIVE STATEMENT: Pt alert and cooperative. Endorses multiple complication of chemo and radiation therapy. Pt planning to stop  in cancer center after tx session.   Pt accompanied by: self  PERTINENT HISTORY: Patient is a 67 y.o. 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 Pt with PMHx of ca of the transverse colon, multiple myeloma, lymphoma, DM, HTN, HLD, CKD, and recently diagnosed prostate cancer. Pt undergoing radiation tx for cancer and reports plan to possibly resume chemotherapy.   DIAGNOSTIC FINDINGS:  MRI brain  04/06/23, "MRI brain findings-Acute infarct in the left parietal lobe with small focus of infarct in the right temporal lobe (5:41, 5:38) evident on diffusion-weighted imaging and FLAIR sequences. A few nodular foci of low signal on susceptibility weighted imaging in the region of left parietal infarct (for example 17:46) may represent small areas of microhemorrhage or cortical vessel thrombus.Mild cerebral volume loss with ex vacuo dilatation of the CSF-containing spaces. There are scattered in confluent foci of signal abnormality within the periventricular and deep white matter.  These are nonspecific but commonly seen with small vessel ischemic changes. There is no midline shift. No extra-axial fluid collection. No mass. There is no abnormal enhancement."  PAIN:  Are you having pain? No  FALLS: Has patient fallen in last 6 months?  No  LIVING ENVIRONMENT: Lives with: lives with their family since stroke; pt lived alone prior to stroke Lives in: House/apartment  PLOF:  Level of assistance: Independent with ADLs, Comment: prior to stroke; now reports needing assistance with iADLs  Employment: Retired   PATIENT GOALS    to be able to participate in complex conversation with family and friends; to understanding what he is reading; to improve QoL; to be more independent   OBJECTIVE:  TODAY'S TREATMENT:  Skilled SLP treatment provided targeting expressive communication. Pt participated in ~40 minutes of complex conversation re: CLOF and current medical concerns. 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 Revisions and hesitates noted with successful repair of communication breakdowns for either anomia or apraxia of speech >95% of the time. Indep use of circumlocution strategy this date. Pt able to co-create script for discussion with MD with extra time. Supportive counseling provided re: current speech/language deficits,  communication strategies, effects of physical health on cognitive-linguistic functioning, and progress to date. Pt appreciative of SLP for listening.     PATIENT EDUCATION: Education details: as above Person educated: Patient Education method: Explanation Education comprehension: verbalized understanding; needs further reinforcement  HOME EXERCISE PROGRAM:   Additional worksheets provided for home practice    GOALS:  Goals reviewed with patient? Yes  SHORT TERM GOALS: Target date: 10 sessions; revised goal - additional 10 weeks  Patient will demonstrate improved expressive language skills during communication breakdowns by using multimodal means (semantic feature analysis, circumlocution, synonym/antonym use, gestures, writing, drawing) to repair with mod cues. Baseline: Goal status: MET  2.  With Moderate A, patient will complete a semantic feature analysis with at least 3-5 relevant features for 8/10 target words with cues to improve word-finding skills.  Baseline:  Goal status: MET  3.  With min A, patient will generate sentences with 5 or more words during structured task at 80% accuracy in order to increase ability to communicate basic wants and needs.  Baseline:  Goal status: REVISED  4.  Pt will participate in further assessment of functional reading and writing to help determine level of assistance needed for language based iADL tasks. Baseline:  Goal status: MET  5.  Patient will follow two step commands  70% acc in context of a functional activity.  Baseline:  Goal status: MET  **New goals added 06/03/23; target date: 10 weeks   6. Pt will co-create x2 scripts for improved participation in medical appointments and                  social events with moderate assistance.    Baseline:    Goal status: IN PROGRESS                7. Pt will create simple text messages to desired communication partners with min A.    Baseline:    Goal status: IN PROGRESS  LONG  TERM GOALS: Target date: 12 weeks  Patient will demonstrate knowledge of appropriate activities to support functional receptive and expressive language outside of ST with assistance from family.  Baseline:  Goal status: IN PROGRESS  2.  Patient will report improved confidence in conversations outside of home (appointments, friends, on the phone) than prior to ST, as measured by Communication Effectiveness Survey.  Baseline:  15/32 Goal status: IN PROGRESS; will complete prior to last session  3.  Patient and/or family will report use of strategies outside of ST to improve communication (use of scripts, pre-planning, semantic feature analysis, supported conversation).  Baseline:  Goal status: IN PROGRESS  4.  Patient will demonstrate improved ability with describing events, storytelling, or sharing medical information through personalized strategies (requesting time, using SFA, drawing, etc) as measured by Communication Effectiveness Survey.  Baseline:  Goal status: IN PROGRESS   ASSESSMENT:  CLINICAL IMPRESSION: Patient is a 67 y.o. 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 Pt making good progress. 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 See details of tx session above.  I recommend continued course of skilled SLP services targeting functional receptive and expressive communication to improve pt's participation in iADLs, preferred activities, and QoL.   OBJECTIVE IMPAIRMENTS include expressive language, receptive language, and aphasia. These impairments are limiting patient from managing medications, managing appointments, managing finances, household responsibilities, ADLs/IADLs, and effectively communicating at home and in community. Factors affecting potential to achieve goals and functional outcome are  co-morbidities. Patient will benefit from skilled SLP services to address above impairments and improve overall function.  REHAB POTENTIAL: Excellent  PLAN: SLP FREQUENCY: 2x/week  SLP DURATION: 12 weeks  PLANNED INTERVENTIONS: Cueing hierachy, Internal/external aids, Functional tasks, Multimodal communication approach, SLP instruction and feedback, Compensatory strategies, and Patient/family education    Clyde Canterbury, M.S., CCC-SLP Speech-Language Pathologist  [facility] [address] [place], [place], [address] Phone: [phone]   Fax:  [phone]

## 2023-07-11 ENCOUNTER — Inpatient Hospital Stay: Payer: Medicare HMO

## 2023-07-11 ENCOUNTER — Ambulatory Visit: Payer: Medicare HMO

## 2023-07-11 DIAGNOSIS — Z5111 Encounter for antineoplastic chemotherapy: Secondary | ICD-10-CM | POA: Diagnosis not present

## 2023-07-11 DIAGNOSIS — D649 Anemia, unspecified: Secondary | ICD-10-CM

## 2023-07-11 MED ORDER — SODIUM CHLORIDE 0.9% IV SOLUTION
250.0000 mL | Freq: Once | INTRAVENOUS | Status: AC
  Administered 2023-07-11: 250 mL via INTRAVENOUS
  Filled 2023-07-11: qty 250

## 2023-07-12 ENCOUNTER — Encounter: Payer: Self-pay | Admitting: Oncology

## 2023-07-12 LAB — TYPE AND SCREEN
ABO/RH(D): O POS
Antibody Screen: NEGATIVE
Unit division: 0

## 2023-07-12 LAB — BPAM RBC
Blood Product Expiration Date: 202409202359
ISSUE DATE / TIME: 202409050851
Unit Type and Rh: 5100

## 2023-07-15 ENCOUNTER — Ambulatory Visit (INDEPENDENT_AMBULATORY_CARE_PROVIDER_SITE_OTHER): Payer: Medicare HMO | Admitting: Gastroenterology

## 2023-07-15 ENCOUNTER — Ambulatory Visit: Payer: Medicare HMO

## 2023-07-15 ENCOUNTER — Encounter: Payer: Self-pay | Admitting: Gastroenterology

## 2023-07-15 VITALS — BP 102/61 | HR 40 | Temp 98.4°F | Ht 69.0 in | Wt 205.0 lb

## 2023-07-15 DIAGNOSIS — R41841 Cognitive communication deficit: Secondary | ICD-10-CM

## 2023-07-15 DIAGNOSIS — R4701 Aphasia: Secondary | ICD-10-CM

## 2023-07-15 DIAGNOSIS — R482 Apraxia: Secondary | ICD-10-CM

## 2023-07-15 DIAGNOSIS — K625 Hemorrhage of anus and rectum: Secondary | ICD-10-CM

## 2023-07-15 MED ORDER — NIFEDIPINE 0.3 % OINTMENT: 1.0000 | TOPICAL_OINTMENT | Freq: Four times a day (QID) | CUTANEOUS | 0 refills | Status: DC

## 2023-07-15 NOTE — Patient Instructions (Addendum)
[facility] [address] [place] [address].  Phone number [phone].   Please allow at least 24 hours before picking up the compounded cream because the pharmacy has to make the medication.

## 2023-07-15 NOTE — Therapy (Signed)
OUTPATIENT SPEECH LANGUAGE PATHOLOGY APHASIA TREATMENT / RECERTIFICATION  Patient Name: Chris George MRN: [id number] DOB:03-16-1956, 67 y.o., male Today's Date: 07/15/2023  PCP: Vallarie Mare REFERRING PROVIDER: Maryjane Hurter, MD   End of Session - 07/15/23 1222     Visit Number 17    Number of Visits 48    Date for SLP Re-Evaluation 10/07/23    Progress Note Due on Visit 20    SLP Start Time 0932    SLP Stop Time  1018    SLP Time Calculation (min) 46 min             Past Medical History:  Diagnosis Date   A-fib (HCC)    Anemia    Arthritis    ankles   Cancer (HCC)    multiple myeloma   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HCC) 10/26/2019   Cardiomyopathy (HCC)    Chemotherapy-induced peripheral neuropathy (HCC)    CHF (congestive heart failure) (HCC)    Depression    Dysrhythmia    atrial fib   History of CVA (cerebrovascular accident)    HLD (hyperlipidemia)    Hypercholesteremia    Hypertension    Moderate tricuspid insufficiency    Multiple myeloma (HCC)    not being treated right now per pt 11/11/19   Pneumonia    Sleep apnea    No CPAP.  OSA resolved with wt loss.   Past Surgical History:  Procedure Laterality Date   ATRIAL ABLATION SURGERY     CARDIAC SURGERY     A-Fib Ablations   COLONOSCOPY WITH PROPOFOL N/A 10/26/2019   Procedure: COLONOSCOPY WITH BIOPSY;  Surgeon: [name], MD;  Location: [facility];  Service: Endoscopy;  Laterality: N/A;   COLONOSCOPY WITH PROPOFOL N/A 09/03/2022   Procedure: COLONOSCOPY WITH PROPOFOL;  Surgeon: [name], MD;  Location: [facility];  Service: Endoscopy;  Laterality: N/A;   ESOPHAGOGASTRODUODENOSCOPY (EGD) WITH PROPOFOL N/A 10/26/2019   Procedure: ESOPHAGOGASTRODUODENOSCOPY (EGD) WITH BIOPSY;  Surgeon: [name], MD;  Location: [facility];  Service: Endoscopy;  Laterality: N/A;  sleep apnea   LAPAROSCOPIC PARTIAL COLECTOMY Right 11/17/2019   Procedure: LAPAROSCOPIC PARTIAL COLECTOMY  RIGHT EXTENDED;  Surgeon: [name], MD;  Location: [facility];  Service: General;  Laterality: Right;   PORTACATH PLACEMENT N/A 12/28/2019   Procedure: INSERTION PORT-A-CATH Left subclavian;  Surgeon: [name], MD;  Location: [facility];  Service: General;  Laterality: N/A;   Patient Active Problem List   Diagnosis Date Noted   HFrEF (heart failure with reduced ejection fraction) (HCC) 06/12/2023   Drug-induced polyneuropathy (HCC) 04/22/2023   Hepatic cirrhosis (HCC) 04/22/2023   Major depressive disorder, single episode, mild (HCC) 04/22/2023   Malignant neoplasm of prostate (HCC) 04/22/2023   Secondary hyperparathyroidism of renal origin (HCC) 04/22/2023   History of CVA (cerebrovascular accident) 04/05/2023   Personal history of colon cancer    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   Diabetes mellitus type 2, controlled, without complications (HCC) 08/07/2021   Neuropathy 08/30/2020   Port-A-Cath in place    Abnormal ECG 11/04/2019   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HCC) 10/27/2019   Goals of care, counseling/discussion 05/18/2019   Multiple myeloma (HCC) 05/17/2019   Anemia 05/12/2019   Arthritis of left ankle 02/05/2018   Cardiomyopathy, idiopathic (HCC) 01/07/2018   Moderate tricuspid insufficiency 01/01/2017   OSA (obstructive sleep apnea) 01/01/2017   Chronic a-fib (HCC) 12/06/2015   Pure hypercholesterolemia 02/09/2015   Anticoagulant long-term use 04/29/2014   Hyperlipidemia 04/29/2014   Hypertension,  essential, benign 04/26/2014    ONSET DATE: 04/05/23  REFERRING DIAG: aphasia  THERAPY DIAG:  Cognitive communication deficit  Aphasia  Rationale for Evaluation and Treatment Rehabilitation  SUBJECTIVE:   SUBJECTIVE STATEMENT: Pt alert and cooperative. Endorses multiple complications of chemo and radiation therapy. Concerned about physical health. Has Urology appointment today.  Pt accompanied by: self  PERTINENT HISTORY: Patient is a  67 y.o. 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 Pt with PMHx of ca of the transverse colon, multiple myeloma, lymphoma, DM, HTN, HLD, CKD, and recently diagnosed prostate cancer. Pt undergoing radiation tx for cancer and reports plan to possibly resume chemotherapy.   DIAGNOSTIC FINDINGS:  MRI brain 04/06/23, "MRI brain findings-Acute infarct in the left parietal lobe with small focus of infarct in the right temporal lobe (5:41, 5:38) evident on diffusion-weighted imaging and FLAIR sequences. A few nodular foci of low signal on susceptibility weighted imaging in the region of left parietal infarct (for example 17:46) may represent small areas of microhemorrhage or cortical vessel thrombus.Mild cerebral volume loss with ex vacuo dilatation of the CSF-containing spaces. There are scattered in confluent foci of signal abnormality within the periventricular and deep white matter.  These are nonspecific but commonly seen with small vessel ischemic changes. There is no midline shift. No extra-axial fluid collection. No mass. There is no abnormal enhancement."  PAIN:  Are you having pain? No  FALLS: Has patient fallen in last 6 months?  No  LIVING ENVIRONMENT: Lives with: lives alone Lives in: House/apartment  PLOF:  Level of assistance: Independent with ADLs, Independent with IADLs Employment: Retired   PATIENT GOALS    to be able to participate in complex conversation with family and friends; to understanding what he is reading; to improve QoL; to be more independent   OBJECTIVE:  TODAY'S TREATMENT:  Skilled SLP treatment provided targeting expressive communication. Pt completed the Communication Effectiveness Survey and score 21/40 (improved from initial evaluation). Pt participated in ~40 minutes of complex conversation re: CLOF, progress to date, current medical concerns, and goal setting for upcoming SLP sessions. 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 Revisions and hesitation noted with successful repair of communication breakdowns for either anomia or apraxia of speech >85% of the time. Min cues for wordfinding/sequencing of sounds.Supportive counseling provided re: current speech/language deficits, communication strategies, effects of physical health on cognitive-linguistic functioning, and progress to date. Pt appreciative of SLP for listening.     PATIENT EDUCATION: Education details: as above Person educated: Patient Education method: Explanation Education comprehension: verbalized understanding; needs further reinforcement  HOME EXERCISE PROGRAM:   None given this date    GOALS:  Goals reviewed with patient? Yes  SHORT TERM GOALS: Target date: 10 sessions; revised goal - additional 10 weeks  Patient will demonstrate improved expressive language skills during communication breakdowns by using multimodal means (semantic feature analysis, circumlocution, synonym/antonym use, gestures, writing, drawing) to repair with mod cues. Baseline: Goal status: MET  2.  With Moderate A, patient will complete a semantic feature analysis with at least 3-5 relevant features for 8/10 target words with cues to improve word-finding skills.  Baseline:  Goal status: MET  3.  With min A, patient will generate sentences with 5 or more words during structured task at 80% accuracy in order to increase ability to communicate basic wants and needs.  Baseline:  Goal status: REVISED  4.  Pt  will participate in further assessment of functional reading and writing to help determine level of assistance needed for language based iADL tasks. Baseline:  Goal status: MET  5.  Patient will follow two step commands 70% acc in context of a functional activity.  Baseline:  Goal status: MET  **New goals added 06/03/23; target date: 10 sessions   6. Pt will co-create x2 scripts  for improved participation in medical appointments and                  social events with moderate assistance.    Baseline:    Goal status: MET                7. Pt will create simple text messages to desired communication partners with min A.    Baseline:    Goal status: IN PROGRESS  **New goals added 9/9; target 10 sessions   8. Pt will compose simple emails to desired communication partners with min A. .    Baseline:   Goal status: INITIAL    9. Pt will utilize compensations for improved reading comprehension/retention with min A.    Baseline:    Goal status: INITIAL   10. Pt will communicate complex ideas (e.g. multiple viewpoints, pros/cons) with min A.    LONG TERM GOALS: Target date: 12 weeks; new date 10/07/23  Patient will demonstrate knowledge of appropriate activities to support functional receptive and expressive language outside of ST with assistance from family.  Baseline:  Goal status: MET  2.  Patient will report improved confidence in conversations outside of home (appointments, friends, on the phone) than prior to ST, as measured by Communication Effectiveness Survey.  Baseline:  15/32; 21/32 Goal status: REVISED; continue goal x1  3.  Patient and/or family will report use of strategies outside of ST to improve communication (use of scripts, pre-planning, semantic feature analysis, supported conversation).  Baseline:  Goal status: MET  4.  Patient will demonstrate improved ability with describing events, storytelling, or sharing medical information through personalized strategies (requesting time, using SFA, drawing, etc) as measured by Communication Effectiveness Survey.  Baseline:  Goal status: MET  **New goals initiated 9/9:  5. Pt will report use of compensations for preferred writing and reading tasks.   Baseline:   Goals status: INITIAL  6. Pt will communicate complex ideas with modified independence.  Baseline: Goal status:  INITIAL  ASSESSMENT:  CLINICAL IMPRESSION: Patient is a 67 y.o. 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 Pt making good progress. 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 Pt has made great progress toward SLP goals and remains highly motivated to improve his functional communication. See details of tx session above.  I recommend continued course of skilled SLP services targeting functional receptive and expressive communication to improve pt's participation in iADLs, preferred activities, and QoL.   OBJECTIVE IMPAIRMENTS include expressive language, receptive language, and aphasia. These impairments are limiting patient from managing medications, managing appointments, managing finances, household responsibilities, ADLs/IADLs, and effectively communicating at home and in community. Factors affecting potential to achieve goals and functional outcome are co-morbidities. Patient will benefit from skilled SLP services to address above impairments and improve overall function.  REHAB POTENTIAL: Excellent  PLAN: SLP FREQUENCY: 2x/week  SLP DURATION: 12 weeks  PLANNED INTERVENTIONS: Cueing hierachy, Internal/external aids, Functional tasks, Multimodal communication  approach, SLP instruction and feedback, Compensatory strategies, and Patient/family education    Clyde Canterbury, M.S., CCC-SLP Speech-Language Pathologist  [facility] [address] [place], [place], [address] Phone: [phone]   Fax:  [phone]

## 2023-07-15 NOTE — Addendum Note (Signed)
Addended by: Woodroe Chen on: 07/15/2023 12:39 PM   Modules accepted: Orders

## 2023-07-15 NOTE — Progress Notes (Signed)
Primary Care Physician: Marina Goodell, MD  Primary Gastroenterologist:  Dr. [name]  Chief Complaint  Patient presents with   Rectal Bleeding   New Patient (Initial Visit)    HPI: Chris George is a 67 y.o. male here with a report of rectal pain and rectal bleeding.  The patient states that he has pain in the melanite.  The had a colonoscopy in 2023 with internal hemorrhoids.  The patient states that he has been diagnosed with prostate cancer and has received chemotherapy and radiation.  He also states that the chemotherapy has caused him to have diarrhea so he takes liquid Imodium for this.  Past Medical History:  Diagnosis Date   A-fib (HCC)    Anemia    Arthritis    ankles   Cancer (HCC)    multiple myeloma   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HCC) 10/26/2019   Cardiomyopathy (HCC)    Chemotherapy-induced peripheral neuropathy (HCC)    CHF (congestive heart failure) (HCC)    Depression    Dysrhythmia    atrial fib   History of CVA (cerebrovascular accident)    HLD (hyperlipidemia)    Hypercholesteremia    Hypertension    Moderate tricuspid insufficiency    Multiple myeloma (HCC)    not being treated right now per pt 11/11/19   Pneumonia    Sleep apnea    No CPAP.  OSA resolved with wt loss.    Current Outpatient Medications  Medication Sig Dispense Refill   allopurinol (ZYLOPRIM) 100 MG tablet Take 200 mg by mouth daily.     apixaban (ELIQUIS) 5 MG TABS tablet Take 5 mg by mouth 2 (two) times daily.     APPLE CIDER VINEGAR PO Take 30-45 mLs by mouth every other day.      atorvastatin (LIPITOR) 80 MG tablet Take 80 mg by mouth daily.     colchicine 0.6 MG tablet Take 1 tablet (0.6 mg total) by mouth 2 (two) times daily. Until flare resolves. 20 tablet 1   furosemide (LASIX) 80 MG tablet Take 40 mg by mouth daily as needed for edema.     gabapentin (NEURONTIN) 100 MG capsule Take 300 mg by mouth 2 (two) times daily.     HYDROcodone-acetaminophen (NORCO) 5-325  MG tablet Take 1 tablet by mouth every 6 (six) hours as needed for moderate pain. 30 tablet 0   hydrocortisone (ANUSOL-HC) 2.5 % rectal cream Place 1 Application rectally 2 (two) times daily. 30 g 0   hydrocortisone cream 1 % Apply 1 Application topically 2 (two) times daily.     lidocaine-prilocaine (EMLA) cream Apply 1 Application topically daily.     loperamide (IMODIUM A-D) 2 MG tablet Take 2 mg by mouth 4 (four) times daily as needed for diarrhea or loose stools.     MAGNESIUM PO Take by mouth.     Misc Natural Products (TART CHERRY ADVANCED PO) Take 60 mLs by mouth every other day.     Multiple Vitamins-Minerals (ZINC PO) Take by mouth.     nifedipine 0.3 % ointment Place 1 Application rectally 4 (four) times daily. with Lidocaine 1.5% 30 g 0   potassium bicarbonate (K-LYTE) 25 MEQ disintegrating tablet Take 25 mEq by mouth 2 (two) times daily. In the afternoon.     prochlorperazine (COMPAZINE) 10 MG tablet Take 1 tablet (10 mg total) by mouth every 6 (six) hours as needed for nausea or vomiting. 60 tablet 1   tamsulosin (FLOMAX) 0.4 MG  CAPS capsule Take 1 capsule (0.4 mg total) by mouth daily after supper. 30 capsule 6   traZODone (DESYREL) 50 MG tablet Take 1 tablet (50 mg total) by mouth at bedtime. Use caution if used with the norco pain medication 30 tablet 2   Zinc Sulfate (ZINC 15 PO) Take by mouth.     No current facility-administered medications for this visit.   Facility-Administered Medications Ordered in Other Visits  Medication Dose Route Frequency Provider Last Rate Last Admin   heparin lock flush 100 unit/mL  500 Units Intravenous Once [name], MD        Allergies as of 07/15/2023   (No Known Allergies)    ROS:  General: Negative for anorexia, weight loss, fever, chills, fatigue, weakness. ENT: Negative for hoarseness, difficulty swallowing , nasal congestion. CV: Negative for chest pain, angina, palpitations, dyspnea on exertion, peripheral edema.   Respiratory: Negative for dyspnea at rest, dyspnea on exertion, cough, sputum, wheezing.  GI: See history of present illness. GU:  Negative for dysuria, hematuria, urinary incontinence, urinary frequency, nocturnal urination.  Endo: Negative for unusual weight change.    Physical Examination:   BP 102/61 (BP Location: Left Arm, Patient Position: Sitting, Cuff Size: Normal)   Pulse (!) 40   Temp 98.4 F (36.9 C) (Oral)   Ht 5\' 9"  (1.753 m)   Wt 205 lb (93 kg)   BMI 30.27 kg/m   General: Well-nourished, well-developed in no acute distress.  Eyes: No icterus. Conjunctivae pink. Rectal: Anal fissure noted at 12:00 with tenderness to palpation Extremities: No lower extremity edema. No clubbing or deformities. Neuro: Alert and oriented x 3.  Grossly intact. Skin: Warm and dry, no jaundice.   Psych: Alert and cooperative, normal mood and affect.  Labs:    Imaging Studies: No results found.  Assessment and Plan:   Chris George is a 67 y.o. y/o male with an anal fissure found on rectal exam.  The patient has been given a prescription for nifedipine cream to be used prior to bowel movements.  The patient will try this for 4 weeks and if he does not improve we we will discuss whether to continue the treatment or send him for surgical evaluation.  The problem is that due to a recent stroke he is on anticoagulation.  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  The patient also has a history of colon cancer with a right hemicolectomy. The patient has been explained the plan and agrees with it.     [name], MD. Clementeen Graham    Note: This dictation was prepared with Dragon dictation along with smaller phrase technology. Any transcriptional errors that result from this process are  unintentional.

## 2023-07-16 ENCOUNTER — Encounter: Payer: Medicare HMO | Admitting: Speech Pathology

## 2023-07-16 ENCOUNTER — Other Ambulatory Visit: Payer: Self-pay | Admitting: *Deleted

## 2023-07-16 DIAGNOSIS — D649 Anemia, unspecified: Secondary | ICD-10-CM

## 2023-07-16 MED FILL — Dexamethasone Sodium Phosphate Inj 100 MG/10ML: INTRAMUSCULAR | Qty: 1 | Status: AC

## 2023-07-17 ENCOUNTER — Encounter: Payer: Self-pay | Admitting: Oncology

## 2023-07-17 ENCOUNTER — Inpatient Hospital Stay (HOSPITAL_BASED_OUTPATIENT_CLINIC_OR_DEPARTMENT_OTHER): Payer: Medicare HMO | Admitting: Oncology

## 2023-07-17 ENCOUNTER — Inpatient Hospital Stay: Payer: Medicare HMO

## 2023-07-17 DIAGNOSIS — C184 Malignant neoplasm of transverse colon: Secondary | ICD-10-CM

## 2023-07-17 DIAGNOSIS — D649 Anemia, unspecified: Secondary | ICD-10-CM

## 2023-07-17 DIAGNOSIS — Z5111 Encounter for antineoplastic chemotherapy: Secondary | ICD-10-CM | POA: Diagnosis not present

## 2023-07-17 LAB — CBC WITH DIFFERENTIAL (CANCER CENTER ONLY)
Abs Immature Granulocytes: 1.75 10*3/uL — ABNORMAL HIGH (ref 0.00–0.07)
Basophils Absolute: 0 10*3/uL (ref 0.0–0.1)
Basophils Relative: 0 %
Eosinophils Absolute: 0 10*3/uL (ref 0.0–0.5)
Eosinophils Relative: 0 %
HCT: 26.7 % — ABNORMAL LOW (ref 39.0–52.0)
Hemoglobin: 8.4 g/dL — ABNORMAL LOW (ref 13.0–17.0)
Immature Granulocytes: 14 %
Lymphocytes Relative: 5 %
Lymphs Abs: 0.7 10*3/uL (ref 0.7–4.0)
MCH: 28.8 pg (ref 26.0–34.0)
MCHC: 31.5 g/dL (ref 30.0–36.0)
MCV: 91.4 fL (ref 80.0–100.0)
Monocytes Absolute: 1.1 10*3/uL — ABNORMAL HIGH (ref 0.1–1.0)
Monocytes Relative: 9 %
Neutro Abs: 8.8 10*3/uL — ABNORMAL HIGH (ref 1.7–7.7)
Neutrophils Relative %: 72 %
Platelet Count: 119 10*3/uL — ABNORMAL LOW (ref 150–400)
RBC: 2.92 MIL/uL — ABNORMAL LOW (ref 4.22–5.81)
RDW: 19.9 % — ABNORMAL HIGH (ref 11.5–15.5)
Smear Review: NORMAL
WBC Count: 12.4 10*3/uL — ABNORMAL HIGH (ref 4.0–10.5)
nRBC: 0.4 % — ABNORMAL HIGH (ref 0.0–0.2)

## 2023-07-17 LAB — CMP (CANCER CENTER ONLY)
ALT: 23 U/L (ref 0–44)
AST: 25 U/L (ref 15–41)
Albumin: 3.3 g/dL — ABNORMAL LOW (ref 3.5–5.0)
Alkaline Phosphatase: 188 U/L — ABNORMAL HIGH (ref 38–126)
Anion gap: 5 (ref 5–15)
BUN: 18 mg/dL (ref 8–23)
CO2: 24 mmol/L (ref 22–32)
Calcium: 8.6 mg/dL — ABNORMAL LOW (ref 8.9–10.3)
Chloride: 106 mmol/L (ref 98–111)
Creatinine: 1.79 mg/dL — ABNORMAL HIGH (ref 0.61–1.24)
GFR, Estimated: 41 mL/min — ABNORMAL LOW (ref >60)
Glucose, Bld: 107 mg/dL — ABNORMAL HIGH (ref 70–99)
Potassium: 4.2 mmol/L (ref 3.5–5.1)
Sodium: 135 mmol/L (ref 135–145)
Total Bilirubin: 0.9 mg/dL (ref 0.3–1.2)
Total Protein: 7 g/dL (ref 6.5–8.1)

## 2023-07-17 LAB — PREPARE RBC (CROSSMATCH)

## 2023-07-17 LAB — MAGNESIUM: Magnesium: 1.4 mg/dL — ABNORMAL LOW (ref 1.7–2.4)

## 2023-07-17 MED ORDER — SODIUM CHLORIDE 0.9% FLUSH
10.0000 mL | Freq: Once | INTRAVENOUS | Status: AC
  Administered 2023-07-17: 10 mL via INTRAVENOUS
  Filled 2023-07-17: qty 10

## 2023-07-17 MED ORDER — SODIUM CHLORIDE 0.9 % IV SOLN
2160.0000 mg/m2 | INTRAVENOUS | Status: DC
  Administered 2023-07-17: 5000 mg via INTRAVENOUS
  Filled 2023-07-17: qty 100

## 2023-07-17 MED ORDER — SODIUM CHLORIDE 0.9 % IV SOLN
360.0000 mg/m2 | Freq: Once | INTRAVENOUS | Status: AC
  Administered 2023-07-17: 782 mg via INTRAVENOUS
  Filled 2023-07-17: qty 39.1

## 2023-07-17 MED ORDER — SODIUM CHLORIDE 0.9 % IV SOLN
Freq: Once | INTRAVENOUS | Status: AC
  Filled 2023-07-17: qty 250

## 2023-07-17 MED ORDER — HEPARIN SOD (PORK) LOCK FLUSH 100 UNIT/ML IV SOLN
500.0000 [IU] | Freq: Once | INTRAVENOUS | Status: DC
  Filled 2023-07-17: qty 5

## 2023-07-17 MED ORDER — PALONOSETRON HCL INJECTION 0.25 MG/5ML
0.2500 mg | Freq: Once | INTRAVENOUS | Status: AC
  Administered 2023-07-17: 0.25 mg via INTRAVENOUS
  Filled 2023-07-17: qty 5

## 2023-07-17 MED ORDER — ATROPINE SULFATE 1 MG/ML IV SOLN
0.5000 mg | Freq: Once | INTRAVENOUS | Status: AC | PRN
  Administered 2023-07-17: 0.5 mg via INTRAVENOUS
  Filled 2023-07-17: qty 1

## 2023-07-17 MED ORDER — MAGNESIUM SULFATE 4 GM/100ML IV SOLN
4.0000 g | Freq: Once | INTRAVENOUS | Status: AC
  Administered 2023-07-17: 4 g via INTRAVENOUS
  Filled 2023-07-17: qty 100

## 2023-07-17 MED ORDER — SODIUM CHLORIDE 0.9 % IV SOLN
160.0000 mg/m2 | Freq: Once | INTRAVENOUS | Status: AC
  Administered 2023-07-17: 340 mg via INTRAVENOUS
  Filled 2023-07-17: qty 15

## 2023-07-17 MED ORDER — SODIUM CHLORIDE 0.9 % IV SOLN
INTRAVENOUS | Status: DC
  Filled 2023-07-17: qty 250

## 2023-07-17 MED ORDER — FLUOROURACIL CHEMO INJECTION 2.5 GM/50ML
360.0000 mg/m2 | Freq: Once | INTRAVENOUS | Status: AC
  Administered 2023-07-17: 800 mg via INTRAVENOUS
  Filled 2023-07-17: qty 16

## 2023-07-17 MED ORDER — SODIUM CHLORIDE 0.9 % IV SOLN
10.0000 mg | Freq: Once | INTRAVENOUS | Status: AC
  Administered 2023-07-17: 10 mg via INTRAVENOUS
  Filled 2023-07-17: qty 10

## 2023-07-17 NOTE — Progress Notes (Signed)
[facility]  Telephone:([phone]) [phone] Fax:([phone]) [phone]  ID: NAY KILLINGER OB: [birth year]  MR#: [id number]  ION#:[id number]  Patient Care Team: Marina Goodell, MD as PCP - General (Family Medicine) [name], MD as Consulting Physician (Oncology) Morene Crocker, MD as Referring Physician (Neurology) Lamar Blinks, MD as Consulting Physician (Cardiology)  CHIEF COMPLAINT: Recurrent colon cancer, smoldering myeloma, prostate cancer.  INTERVAL HISTORY: Patient returns to clinic today for further evaluation and consideration of cycle 5 of FOLFIRI.  He continues to have chronic weakness and fatigue, but otherwise feels well.  He felt slightly improved after receiving blood transfusion approximately 2 weeks ago.  He continues to have a mild expressive aphasia and a chronic peripheral neuropathy, but no other neurologic complaints. He denies any recent fevers or illnesses.  He has a good appetite and denies weight loss.  He has no chest pain, shortness of breath, cough, or hemoptysis.  He denies any nausea, vomiting, constipation, or diarrhea.  He has no melena or hematochezia.  He has no urinary complaints.  Patient offers no further specific complaints today.  REVIEW OF SYSTEMS:   Review of Systems  Constitutional:  Positive for malaise/fatigue. Negative for fever and weight loss.  Respiratory: Negative.  Negative for cough, hemoptysis and shortness of breath.   Cardiovascular: Negative.  Negative for chest pain and leg swelling.  Gastrointestinal: Negative.  Negative for blood in stool, diarrhea and melena.  Genitourinary: Negative.  Negative for hematuria.  Musculoskeletal: Negative.  Negative for back pain and joint pain.  Skin: Negative.  Negative for rash.  Neurological:  Positive for tingling, sensory change and weakness. Negative for dizziness, focal weakness and headaches.  Psychiatric/Behavioral: Negative.  Negative for depression. The patient is  not nervous/anxious.     As per HPI. Otherwise, a complete review of systems is negative.  PAST MEDICAL HISTORY: Past Medical History:  Diagnosis Date   A-fib (HCC)    Anemia    Arthritis    ankles   Cancer (HCC)    multiple myeloma   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HCC) 10/26/2019   Cardiomyopathy (HCC)    Chemotherapy-induced peripheral neuropathy (HCC)    CHF (congestive heart failure) (HCC)    Depression    Dysrhythmia    atrial fib   History of CVA (cerebrovascular accident)    HLD (hyperlipidemia)    Hypercholesteremia    Hypertension    Moderate tricuspid insufficiency    Multiple myeloma (HCC)    not being treated right now per pt 11/11/19   Pneumonia    Sleep apnea    No CPAP.  OSA resolved with wt loss.    PAST SURGICAL HISTORY: Past Surgical History:  Procedure Laterality Date   ATRIAL ABLATION SURGERY     CARDIAC SURGERY     A-Fib Ablations   COLONOSCOPY WITH PROPOFOL N/A 10/26/2019   Procedure: COLONOSCOPY WITH BIOPSY;  Surgeon: [name], MD;  Location: [facility];  Service: Endoscopy;  Laterality: N/A;   COLONOSCOPY WITH PROPOFOL N/A 09/03/2022   Procedure: COLONOSCOPY WITH PROPOFOL;  Surgeon: [name], MD;  Location: [facility];  Service: Endoscopy;  Laterality: N/A;   ESOPHAGOGASTRODUODENOSCOPY (EGD) WITH PROPOFOL N/A 10/26/2019   Procedure: ESOPHAGOGASTRODUODENOSCOPY (EGD) WITH BIOPSY;  Surgeon: [name], MD;  Location: [facility];  Service: Endoscopy;  Laterality: N/A;  sleep apnea   LAPAROSCOPIC PARTIAL COLECTOMY Right 11/17/2019   Procedure: LAPAROSCOPIC PARTIAL COLECTOMY RIGHT EXTENDED;  Surgeon: [name], MD;  Location: [facility]  ORS;  Service: General;  Laterality: Right;   PORTACATH PLACEMENT N/A 12/28/2019   Procedure: INSERTION PORT-A-CATH Left subclavian;  Surgeon: [name], MD;  Location: [facility] ORS;  Service: General;  Laterality: N/A;    FAMILY HISTORY: Family History  Problem Relation Age of Onset    Healthy Mother     ADVANCED DIRECTIVES (Y/N):  N  HEALTH MAINTENANCE: Social History   Tobacco Use   Smoking status: Never    Passive exposure: Never   Smokeless tobacco: Never  Vaping Use   Vaping status: Never Used  Substance Use Topics   Alcohol use: Not Currently   Drug use: Never     Colonoscopy:  PAP:  Bone density:  Lipid panel:  No Known Allergies  Current Outpatient Medications  Medication Sig Dispense Refill   allopurinol (ZYLOPRIM) 100 MG tablet Take 200 mg by mouth daily.     apixaban (ELIQUIS) 5 MG TABS tablet Take 5 mg by mouth 2 (two) times daily.     APPLE CIDER VINEGAR PO Take 30-45 mLs by mouth every other day.      atorvastatin (LIPITOR) 80 MG tablet Take 80 mg by mouth daily.     colchicine 0.6 MG tablet Take 1 tablet (0.6 mg total) by mouth 2 (two) times daily. Until flare resolves. 20 tablet 1   furosemide (LASIX) 80 MG tablet Take 40 mg by mouth daily as needed for edema.     gabapentin (NEURONTIN) 100 MG capsule Take 300 mg by mouth 2 (two) times daily.     HYDROcodone-acetaminophen (NORCO) 5-325 MG tablet Take 1 tablet by mouth every 6 (six) hours as needed for moderate pain. 30 tablet 0   hydrocortisone (ANUSOL-HC) 2.5 % rectal cream Place 1 Application rectally 2 (two) times daily. 30 g 0   hydrocortisone cream 1 % Apply 1 Application topically 2 (two) times daily.     lidocaine-prilocaine (EMLA) cream Apply 1 Application topically daily.     loperamide (IMODIUM A-D) 2 MG tablet Take 2 mg by mouth 4 (four) times daily as needed for diarrhea or loose stools.     MAGNESIUM PO Take by mouth.     Misc Natural Products (TART CHERRY ADVANCED PO) Take 60 mLs by mouth every other day.     Multiple Vitamins-Minerals (ZINC PO) Take by mouth.     nifedipine 0.3 % ointment Place 1 Application rectally 4 (four) times daily. with Lidocaine 1.5% 30 g 0   potassium bicarbonate (K-LYTE) 25 MEQ disintegrating tablet Take 25 mEq by mouth 2 (two) times daily. In the  afternoon.     prochlorperazine (COMPAZINE) 10 MG tablet Take 1 tablet (10 mg total) by mouth every 6 (six) hours as needed for nausea or vomiting. 60 tablet 1   tamsulosin (FLOMAX) 0.4 MG CAPS capsule Take 1 capsule (0.4 mg total) by mouth daily after supper. 30 capsule 6   traZODone (DESYREL) 50 MG tablet Take 1 tablet (50 mg total) by mouth at bedtime. Use caution if used with the norco pain medication 30 tablet 2   Zinc Sulfate (ZINC 15 PO) Take by mouth.     No current facility-administered medications for this visit.   Facility-Administered Medications Ordered in Other Visits  Medication Dose Route Frequency Provider Last Rate Last Admin   heparin lock flush 100 unit/mL  500 Units Intravenous Once [name], MD       heparin lock flush 100 unit/mL  500 Units Intravenous Once [name], MD  OBJECTIVE: Vitals:   07/17/23 0837  BP: (!) 101/51  Pulse: (!) 43  Resp: 16  Temp: (!) 96.8 F (36 C)  SpO2: 100%      Body mass index is 30.57 kg/m.    ECOG FS:1 - Symptomatic but completely ambulatory  General: Well-developed, well-nourished, no acute distress. Eyes: Pink conjunctiva, anicteric sclera. HEENT: Normocephalic, moist mucous membranes. Lungs: No audible wheezing or coughing. Heart: Regular rate and rhythm. Abdomen: Soft, nontender, no obvious distention. Musculoskeletal: No edema, cyanosis, or clubbing. Neuro: Alert, answering all questions appropriately. Cranial nerves grossly intact. Skin: No rashes or petechiae noted. Psych: Normal affect.  LAB RESULTS:  Lab Results  Component Value Date   NA 132 (L) 07/09/2023   K 3.8 07/09/2023   CL 103 07/09/2023   CO2 23 07/09/2023   GLUCOSE 116 (H) 07/09/2023   BUN 34 (H) 07/09/2023   CREATININE 1.58 (H) 07/09/2023   CALCIUM 8.1 (L) 07/09/2023   PROT 6.7 07/09/2023   ALBUMIN 3.3 (L) 07/09/2023   AST 16 07/09/2023   ALT 16 07/09/2023   ALKPHOS 155 (H) 07/09/2023   BILITOT 1.3 (H) 07/09/2023    GFRNONAA 48 (L) 07/09/2023   GFRAA >60 07/29/2020    Lab Results  Component Value Date   WBC 5.1 07/09/2023   NEUTROABS 4.4 07/09/2023   HGB 8.3 (L) 07/09/2023   HCT 26.1 (L) 07/09/2023   MCV 89.4 07/09/2023   PLT 64 (L) 07/09/2023   Lab Results  Component Value Date   IRON 124 07/09/2023   TIBC 288 07/09/2023   IRONPCTSAT 43 (H) 07/09/2023   Lab Results  Component Value Date   FERRITIN 616 (H) 07/09/2023     STUDIES: No results found.   ASSESSMENT: Stage IIIc colon cancer, smoldering myeloma, prostate cancer.  PLAN:    Stage IIIc colon cancer: Patient completed cycle 9 of adjuvant FOLFOX on June 03, 2020.  Given his difficulties with treatment and declining performance status, treatment was discontinued altogether. Colonoscopy on September 03, 2022 did not reveal any significant pathology and recommendation was to repeat in 3 years.  CT scan from December 07, 2022 reviewed independently with enlarging aortocaval node now measuring 1.9 x 1.4 cm.  PET scan results from Mar 15, 2023 reviewed independently with progressive retroperitoneal lymphadenopathy presumably related to patient's colon cancer.  Patient CEA trended up and peaked at 194, but now is trending down and his most recent result was 148.  Today's result is pending.  Proceed with cycle 5 of treatment today.  Treatment was previously dose reduced 10% given his renal insufficiency and baseline pancytopenia.  Will hold Avastin at this time given his recent stroke.  Return to clinic in 2 days for pump removal, Udenyca, and 1 unit of packed red blood cells.  Patient will then return to clinic in 2 weeks for further evaluation and consideration of cycle 6.  Plan to reimage at the conclusion of cycle 6. Prostate cancer: Gleason's 3+4.  Patient's PSA is increased, but relatively stable at 32.4.  Imaging as above.  Nuclear medicine bone scan on December 13, 2022 was reported as negative.  PSMA PET per radiation oncology from Mar 21, 2023 did not reveal metastatic disease.  Patient completed XRT for local control disease on May 30, 2023.  Smoldering myeloma: Chronic and unchanged.  Patient was initially diagnosed and treated in [place], [place] and treated with single agent Velcade in approximately 2013.  He declined maintenance treatment or referral for bone marrow transplant.  His M spike has ranged from 1.1-2.0 since July 2020.  His most recent result was 1.6.  Over the same timeframe his IgG component has ranged from [phone].  His most recent result is 2344.  Finally, his kappa free light chains have ranged from 24.8 -75.3 with his most recent result reported at 36.2.  Nuclear med bone scan as above.  His most recent bone marrow biopsy completed on May 28, 2019 revealed only 10 to 15% plasma cells with no clonality reported.  Cytogenetics were also reported as normal.  Continue to monitor closely.  Anemia: Hemoglobin relatively unchanged despite 1 unit of packed red blood cells.  Proceed with a second unit in 2 days with pump removal.   Neutropenia: Resolved.  Continue Udenyca with pump removal.  Dose reduced FOLFIRI as above. Thrombocytopenia: Platelets improved to 119.  Proceed with treatment as above. Renal insufficiency: Chronic and unchanged.  Patient's creatinine is 1.79.  Neither irinotecan or 5-FU need to be dose reduced in the setting of renal insufficiency.  Patient reports he has not seen nephrology in several years.  If his creatinine continues to remain elevated, will give a referral back. Hypomagnesia: Patient will receive 4 g IV magnesium today. Peripheral neuropathy: Chronic and unchanged.  Patient reports gabapentin did not help.  He was recently initiated on Lyrica 50 mg daily and given a referral to neurology.   Hypotension: Mild, monitor.  Patient expressed understanding and was in agreement with this plan. He also understands that He can call clinic at any time with any questions, concerns, or complaints.     [name], MD   07/17/2023 9:04 AM

## 2023-07-17 NOTE — Patient Instructions (Signed)
[place] [facility]  Discharge Instructions: Thank you for choosing [facility] to provide your oncology and hematology care.  If you have a lab appointment with the Cancer Center, please go directly to the Cancer Center and check in at the registration area.  Wear comfortable clothing and clothing appropriate for easy access to any Portacath or PICC line.   We strive to give you quality time with your provider. You may need to reschedule your appointment if you arrive late (15 or more minutes).  Arriving late affects you and other patients whose appointments are after yours.  Also, if you miss three or more appointments without notifying the office, you may be dismissed from the clinic at the provider's discretion.      For prescription refill requests, have your pharmacy contact our office and allow 72 hours for refills to be completed.    Today you received the following chemotherapy and/or immunotherapy agents Irinotecan, Leucovrin, 5FU push and pump.      To help prevent nausea and vomiting after your treatment, we encourage you to take your nausea medication as directed.  BELOW ARE SYMPTOMS THAT SHOULD BE REPORTED IMMEDIATELY: *FEVER GREATER THAN 100.4 F (38 C) OR HIGHER *CHILLS OR SWEATING *NAUSEA AND VOMITING THAT IS NOT CONTROLLED WITH YOUR NAUSEA MEDICATION *UNUSUAL SHORTNESS OF BREATH *UNUSUAL BRUISING OR BLEEDING *URINARY PROBLEMS (pain or burning when urinating, or frequent urination) *BOWEL PROBLEMS (unusual diarrhea, constipation, pain near the anus) TENDERNESS IN MOUTH AND THROAT WITH OR WITHOUT PRESENCE OF ULCERS (sore throat, sores in mouth, or a toothache) UNUSUAL RASH, SWELLING OR PAIN  UNUSUAL VAGINAL DISCHARGE OR ITCHING   Items with * indicate a potential emergency and should be followed up as soon as possible or go to the Emergency Department if any problems should occur.  Please show the CHEMOTHERAPY ALERT CARD or  IMMUNOTHERAPY ALERT CARD at check-in to the Emergency Department and triage nurse.  Should you have questions after your visit or need to cancel or reschedule your appointment, please contact [place] [facility]  [phone] and follow the prompts.  Office hours are 8:00 a.m. to 4:30 p.m. Monday - Friday. Please note that voicemails left after 4:00 p.m. may not be returned until the following business day.  We are closed weekends and major holidays. You have access to a nurse at all times for urgent questions. Please call the main number to the clinic [phone] and follow the prompts.  For any non-urgent questions, you may also contact your provider using MyChart. We now offer e-Visits for anyone 55 and older to request care online for non-urgent symptoms. For details visit [email or web address].   Also download the MyChart app! Go to the app store, search "MyChart", open the app, select [place], and log in with your MyChart username and password.

## 2023-07-18 ENCOUNTER — Ambulatory Visit: Payer: Medicare HMO

## 2023-07-18 DIAGNOSIS — R482 Apraxia: Secondary | ICD-10-CM

## 2023-07-18 DIAGNOSIS — R4701 Aphasia: Secondary | ICD-10-CM

## 2023-07-18 DIAGNOSIS — R41841 Cognitive communication deficit: Secondary | ICD-10-CM | POA: Diagnosis not present

## 2023-07-18 NOTE — Therapy (Signed)
OUTPATIENT SPEECH LANGUAGE PATHOLOGY APHASIA TREATMENT   Patient Name: Chris George MRN: [id number] DOB:05/21/1956, 67 y.o., male Today's Date: 07/18/2023  PCP: Vallarie Mare REFERRING PROVIDER: Maryjane Hurter, MD   End of Session - 07/18/23 1535     Visit Number 18    Number of Visits 48    Date for SLP Re-Evaluation 10/07/23    Progress Note Due on Visit 20    SLP Start Time 1407    SLP Stop Time  1445    SLP Time Calculation (min) 38 min    Activity Tolerance Patient tolerated treatment well             Past Medical History:  Diagnosis Date   A-fib (HCC)    Anemia    Arthritis    ankles   Cancer (HCC)    multiple myeloma   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HCC) 10/26/2019   Cardiomyopathy (HCC)    Chemotherapy-induced peripheral neuropathy (HCC)    CHF (congestive heart failure) (HCC)    Depression    Dysrhythmia    atrial fib   History of CVA (cerebrovascular accident)    HLD (hyperlipidemia)    Hypercholesteremia    Hypertension    Moderate tricuspid insufficiency    Multiple myeloma (HCC)    not being treated right now per pt 11/11/19   Pneumonia    Sleep apnea    No CPAP.  OSA resolved with wt loss.   Past Surgical History:  Procedure Laterality Date   ATRIAL ABLATION SURGERY     CARDIAC SURGERY     A-Fib Ablations   COLONOSCOPY WITH PROPOFOL N/A 10/26/2019   Procedure: COLONOSCOPY WITH BIOPSY;  Surgeon: [name], MD;  Location: [facility];  Service: Endoscopy;  Laterality: N/A;   COLONOSCOPY WITH PROPOFOL N/A 09/03/2022   Procedure: COLONOSCOPY WITH PROPOFOL;  Surgeon: [name], MD;  Location: [facility];  Service: Endoscopy;  Laterality: N/A;   ESOPHAGOGASTRODUODENOSCOPY (EGD) WITH PROPOFOL N/A 10/26/2019   Procedure: ESOPHAGOGASTRODUODENOSCOPY (EGD) WITH BIOPSY;  Surgeon: [name], MD;  Location: [facility];  Service: Endoscopy;  Laterality: N/A;  sleep apnea   LAPAROSCOPIC PARTIAL COLECTOMY Right 11/17/2019    Procedure: LAPAROSCOPIC PARTIAL COLECTOMY RIGHT EXTENDED;  Surgeon: [name], MD;  Location: [facility];  Service: General;  Laterality: Right;   PORTACATH PLACEMENT N/A 12/28/2019   Procedure: INSERTION PORT-A-CATH Left subclavian;  Surgeon: [name], MD;  Location: [facility];  Service: General;  Laterality: N/A;   Patient Active Problem List   Diagnosis Date Noted   HFrEF (heart failure with reduced ejection fraction) (HCC) 06/12/2023   Drug-induced polyneuropathy (HCC) 04/22/2023   Hepatic cirrhosis (HCC) 04/22/2023   Major depressive disorder, single episode, mild (HCC) 04/22/2023   Malignant neoplasm of prostate (HCC) 04/22/2023   Secondary hyperparathyroidism of renal origin (HCC) 04/22/2023   History of CVA (cerebrovascular accident) 04/05/2023   Personal history of colon cancer    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   Diabetes mellitus type 2, controlled, without complications (HCC) 08/07/2021   Neuropathy 08/30/2020   Port-A-Cath in place    Abnormal ECG 11/04/2019   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HCC) 10/27/2019   Goals of care, counseling/discussion 05/18/2019   Multiple myeloma (HCC) 05/17/2019   Anemia 05/12/2019   Arthritis of left ankle 02/05/2018   Cardiomyopathy, idiopathic (HCC) 01/07/2018   Moderate tricuspid insufficiency 01/01/2017   OSA (obstructive sleep apnea) 01/01/2017   Chronic a-fib (HCC) 12/06/2015   Pure hypercholesterolemia 02/09/2015   Anticoagulant long-term use  04/29/2014   Hyperlipidemia 04/29/2014   Hypertension, essential, benign 04/26/2014    ONSET DATE: 04/05/23  REFERRING DIAG: aphasia  THERAPY DIAG:  Aphasia  Apraxia of speech  Rationale for Evaluation and Treatment Rehabilitation  SUBJECTIVE:   SUBJECTIVE STATEMENT: Pt alert and cooperative. Endorses multiple complications of chemo and radiation therapy. Concerned about physical health. Has Urology appointment today.  Pt accompanied by:  self  PERTINENT HISTORY: Patient is a 67 y.o. 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 Pt with PMHx of ca of the transverse colon, multiple myeloma, lymphoma, DM, HTN, HLD, CKD, and recently diagnosed prostate cancer. Pt undergoing radiation tx for cancer and reports plan to possibly resume chemotherapy.   DIAGNOSTIC FINDINGS:  MRI brain 04/06/23, "MRI brain findings-Acute infarct in the left parietal lobe with small focus of infarct in the right temporal lobe (5:41, 5:38) evident on diffusion-weighted imaging and FLAIR sequences. A few nodular foci of low signal on susceptibility weighted imaging in the region of left parietal infarct (for example 17:46) may represent small areas of microhemorrhage or cortical vessel thrombus.Mild cerebral volume loss with ex vacuo dilatation of the CSF-containing spaces. There are scattered in confluent foci of signal abnormality within the periventricular and deep white matter.  These are nonspecific but commonly seen with small vessel ischemic changes. There is no midline shift. No extra-axial fluid collection. No mass. There is no abnormal enhancement."  PAIN:  Are you having pain? No  FALLS: Has patient fallen in last 6 months?  No  LIVING ENVIRONMENT: Lives with: lives alone Lives in: House/apartment  PLOF:  Level of assistance: Independent with ADLs, Independent with IADLs Employment: Retired   PATIENT GOALS    to be able to participate in complex conversation with family and friends; to understanding what he is reading; to improve QoL; to be more independent   OBJECTIVE:  TODAY'S TREATMENT:  Skilled SLP treatment provided targeting expressive communication and writing. 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 Revisions and hesitation noted with successful repair of communication breakdowns for either anomia or apraxia of speech  >85% of the time. Rare cues for wordfinding/sequencing of sounds. Supportive counseling provided re: current speech/language deficits, communication strategies, effects of physical health on cognitive-linguistic functioning, and progress to date. Pt appreciative of SLP for listening. Pt able to write a simple email with extra time for revision. Pt aware of typos and paraphasic errors and able to ID and correct errors with extra time. Introduced strategies such as use of spell check, grammar check, and reading written text aloud. Pt agreeable to trialing strategies at home.      PATIENT EDUCATION: Education details: as above Person educated: Patient Education method: Explanation Education comprehension: verbalized understanding; needs further reinforcement  HOME EXERCISE PROGRAM:   None given this date    GOALS:  Goals reviewed with patient? Yes  SHORT TERM GOALS: Target date: 10 sessions; revised goal - additional 10 weeks  Patient will demonstrate improved expressive language skills during communication breakdowns by using multimodal means (semantic feature analysis, circumlocution, synonym/antonym use, gestures, writing, drawing) to repair with mod cues. Baseline: Goal status: MET  2.  With Moderate A, patient will complete a semantic feature analysis with at least 3-5 relevant features for 8/10 target words with cues to improve word-finding skills.  Baseline:  Goal status: MET  3.  With min A, patient will generate sentences with 5 or more words  during structured task at 80% accuracy in order to increase ability to communicate basic wants and needs.  Baseline:  Goal status: REVISED  4.  Pt will participate in further assessment of functional reading and writing to help determine level of assistance needed for language based iADL tasks. Baseline:  Goal status: MET  5.  Patient will follow two step commands 70% acc in context of a functional activity.  Baseline:  Goal status:  MET  **New goals added 06/03/23; target date: 10 sessions   6. Pt will co-create x2 scripts for improved participation in medical appointments and                  social events with moderate assistance.    Baseline:    Goal status: MET                7. Pt will create simple text messages to desired communication partners with min A.    Baseline:    Goal status: IN PROGRESS  **New goals added 9/9; target 10 sessions   8. Pt will compose simple emails to desired communication partners with min A. .    Baseline:   Goal status: INITIAL    9. Pt will utilize compensations for improved reading comprehension/retention with min A.    Baseline:    Goal status: INITIAL   10. Pt will communicate complex ideas (e.g. multiple viewpoints, pros/cons) with min A.    LONG TERM GOALS: Target date: 12 weeks; new date 10/07/23  Patient will demonstrate knowledge of appropriate activities to support functional receptive and expressive language outside of ST with assistance from family.  Baseline:  Goal status: MET  2.  Patient will report improved confidence in conversations outside of home (appointments, friends, on the phone) than prior to ST, as measured by Communication Effectiveness Survey.  Baseline:  15/32; 21/32 Goal status: REVISED; continue goal x1  3.  Patient and/or family will report use of strategies outside of ST to improve communication (use of scripts, pre-planning, semantic feature analysis, supported conversation).  Baseline:  Goal status: MET  4.  Patient will demonstrate improved ability with describing events, storytelling, or sharing medical information through personalized strategies (requesting time, using SFA, drawing, etc) as measured by Communication Effectiveness Survey.  Baseline:  Goal status: MET  **New goals initiated 9/9:  5. Pt will report use of compensations for preferred writing and reading tasks.   Baseline:   Goals status: INITIAL  6. Pt will  communicate complex ideas with modified independence.  Baseline: Goal status: INITIAL  ASSESSMENT:  CLINICAL IMPRESSION: Patient is a 67 y.o. 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 Pt making good progress. 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  I recommend continued course of skilled SLP services targeting functional receptive and expressive communication to improve pt's participation in iADLs, preferred activities, and QoL.   OBJECTIVE IMPAIRMENTS include expressive language, receptive language, and aphasia. These impairments are limiting patient from managing medications, managing appointments, managing finances, household responsibilities, ADLs/IADLs, and effectively communicating at home and in community. Factors affecting potential to achieve goals and functional outcome are co-morbidities. Patient will benefit from skilled SLP services to address above impairments and improve overall function.  REHAB POTENTIAL: Excellent  PLAN: SLP FREQUENCY: 2x/week  SLP DURATION: 12 weeks  PLANNED  INTERVENTIONS: Cueing hierachy, Internal/external aids, Functional tasks, Multimodal communication approach, SLP instruction and feedback, Compensatory strategies, and Patient/family education    Clyde Canterbury, M.S., CCC-SLP Speech-Language Pathologist  [facility] at [facility] [address] [place], [place], [address] Phone: [phone]   Fax:  [phone]

## 2023-07-19 ENCOUNTER — Inpatient Hospital Stay: Payer: Medicare HMO

## 2023-07-19 VITALS — BP 104/55 | HR 57 | Temp 97.1°F | Resp 16

## 2023-07-19 DIAGNOSIS — C184 Malignant neoplasm of transverse colon: Secondary | ICD-10-CM

## 2023-07-19 DIAGNOSIS — Z5111 Encounter for antineoplastic chemotherapy: Secondary | ICD-10-CM | POA: Diagnosis not present

## 2023-07-19 MED ORDER — ACETAMINOPHEN 325 MG PO TABS
650.0000 mg | ORAL_TABLET | Freq: Once | ORAL | Status: AC
  Administered 2023-07-19: 650 mg via ORAL
  Filled 2023-07-19: qty 2

## 2023-07-19 MED ORDER — DIPHENHYDRAMINE HCL 50 MG/ML IJ SOLN
25.0000 mg | Freq: Once | INTRAMUSCULAR | Status: AC
  Administered 2023-07-19: 25 mg via INTRAVENOUS
  Filled 2023-07-19: qty 1

## 2023-07-19 MED ORDER — SODIUM CHLORIDE 0.9% IV SOLUTION
250.0000 mL | Freq: Once | INTRAVENOUS | Status: AC
  Administered 2023-07-19: 250 mL via INTRAVENOUS
  Filled 2023-07-19: qty 250

## 2023-07-19 MED ORDER — PEGFILGRASTIM-JMDB 6 MG/0.6ML ~~LOC~~ SOSY
6.0000 mg | PREFILLED_SYRINGE | Freq: Once | SUBCUTANEOUS | Status: AC
  Administered 2023-07-19: 6 mg via SUBCUTANEOUS
  Filled 2023-07-19: qty 0.6

## 2023-07-19 NOTE — Patient Instructions (Signed)
[facility]  Discharge Instructions: Thank you for choosing [facility] to provide your oncology and hematology care.  If you have a lab appointment with the Cancer Center, please go directly to the Cancer Center and check in at the registration area.  Wear comfortable clothing and clothing appropriate for easy access to any Portacath or PICC line.   We strive to give you quality time with your provider. You may need to reschedule your appointment if you arrive late (15 or more minutes).  Arriving late affects you and other patients whose appointments are after yours.  Also, if you miss three or more appointments without notifying the office, you may be dismissed from the clinic at the provider's discretion.      For prescription refill requests, have your pharmacy contact our office and allow 72 hours for refills to be completed.    Today you received the following chemotherapy and/or immunotherapy agents Fulphila      To help prevent nausea and vomiting after your treatment, we encourage you to take your nausea medication as directed.  BELOW ARE SYMPTOMS THAT SHOULD BE REPORTED IMMEDIATELY: *FEVER GREATER THAN 100.4 F (38 C) OR HIGHER *CHILLS OR SWEATING *NAUSEA AND VOMITING THAT IS NOT CONTROLLED WITH YOUR NAUSEA MEDICATION *UNUSUAL SHORTNESS OF BREATH *UNUSUAL BRUISING OR BLEEDING *URINARY PROBLEMS (pain or burning when urinating, or frequent urination) *BOWEL PROBLEMS (unusual diarrhea, constipation, pain near the anus) TENDERNESS IN MOUTH AND THROAT WITH OR WITHOUT PRESENCE OF ULCERS (sore throat, sores in mouth, or a toothache) UNUSUAL RASH, SWELLING OR PAIN  UNUSUAL VAGINAL DISCHARGE OR ITCHING   Items with * indicate a potential emergency and should be followed up as soon as possible or go to the Emergency Department if any problems should occur.  Please show the CHEMOTHERAPY ALERT CARD or IMMUNOTHERAPY ALERT CARD at check-in to  the Emergency Department and triage nurse.  Should you have questions after your visit or need to cancel or reschedule your appointment, please contact [facility]  [phone] and follow the prompts.  Office hours are 8:00 a.m. to 4:30 p.m. Monday - Friday. Please note that voicemails left after 4:00 p.m. may not be returned until the following business day.  We are closed weekends and major holidays. You have access to a nurse at all times for urgent questions. Please call the main number to the clinic [phone] and follow the prompts.  For any non-urgent questions, you may also contact your provider using MyChart. We now offer e-Visits for anyone 31 and older to request care online for non-urgent symptoms. For details visit [email or web address].   Also download the MyChart app! Go to the app store, search "MyChart", open the app, select [facility], and log in with your MyChart username and password.

## 2023-07-20 LAB — BPAM RBC
Blood Product Expiration Date: 202410072359
ISSUE DATE / TIME: 202409131356
Unit Type and Rh: 5100

## 2023-07-20 LAB — TYPE AND SCREEN
ABO/RH(D): O POS
Antibody Screen: NEGATIVE
Unit division: 0

## 2023-07-23 ENCOUNTER — Other Ambulatory Visit: Payer: Self-pay

## 2023-07-23 ENCOUNTER — Telehealth: Payer: Self-pay | Admitting: Gastroenterology

## 2023-07-23 ENCOUNTER — Ambulatory Visit: Payer: Medicare HMO

## 2023-07-23 ENCOUNTER — Emergency Department
Admission: EM | Admit: 2023-07-23 | Discharge: 2023-07-23 | Disposition: A | Discharge status: Home / Self Care | Payer: Medicare HMO | Attending: Emergency Medicine | Admitting: Emergency Medicine

## 2023-07-23 ENCOUNTER — Telehealth: Payer: Self-pay | Admitting: *Deleted

## 2023-07-23 DIAGNOSIS — Z7901 Long term (current) use of anticoagulants: Secondary | ICD-10-CM | POA: Insufficient documentation

## 2023-07-23 DIAGNOSIS — Z85038 Personal history of other malignant neoplasm of large intestine: Secondary | ICD-10-CM | POA: Diagnosis not present

## 2023-07-23 DIAGNOSIS — K600 Acute anal fissure: Secondary | ICD-10-CM | POA: Insufficient documentation

## 2023-07-23 DIAGNOSIS — Z8546 Personal history of malignant neoplasm of prostate: Secondary | ICD-10-CM | POA: Diagnosis not present

## 2023-07-23 DIAGNOSIS — K602 Anal fissure, unspecified: Secondary | ICD-10-CM

## 2023-07-23 DIAGNOSIS — K625 Hemorrhage of anus and rectum: Secondary | ICD-10-CM | POA: Diagnosis not present

## 2023-07-23 DIAGNOSIS — I4891 Unspecified atrial fibrillation: Secondary | ICD-10-CM | POA: Insufficient documentation

## 2023-07-23 DIAGNOSIS — R1030 Lower abdominal pain, unspecified: Secondary | ICD-10-CM | POA: Diagnosis not present

## 2023-07-23 DIAGNOSIS — K6289 Other specified diseases of anus and rectum: Secondary | ICD-10-CM

## 2023-07-23 LAB — COMPREHENSIVE METABOLIC PANEL
ALT: 20 U/L (ref 0–44)
AST: 20 U/L (ref 15–41)
Albumin: 3.2 g/dL — ABNORMAL LOW (ref 3.5–5.0)
Alkaline Phosphatase: 177 U/L — ABNORMAL HIGH (ref 38–126)
Anion gap: 7 (ref 5–15)
BUN: 28 mg/dL — ABNORMAL HIGH (ref 8–23)
CO2: 22 mmol/L (ref 22–32)
Calcium: 8.3 mg/dL — ABNORMAL LOW (ref 8.9–10.3)
Chloride: 107 mmol/L (ref 98–111)
Creatinine, Ser: 1.31 mg/dL — ABNORMAL HIGH (ref 0.61–1.24)
GFR, Estimated: 60 mL/min — ABNORMAL LOW (ref >60)
Glucose, Bld: 102 mg/dL — ABNORMAL HIGH (ref 70–99)
Potassium: 4.5 mmol/L (ref 3.5–5.1)
Sodium: 136 mmol/L (ref 135–145)
Total Bilirubin: 1.7 mg/dL — ABNORMAL HIGH (ref 0.3–1.2)
Total Protein: 6.8 g/dL (ref 6.5–8.1)

## 2023-07-23 LAB — CBC WITH DIFFERENTIAL/PLATELET
Abs Immature Granulocytes: 0.07 10*3/uL (ref 0.00–0.07)
Basophils Absolute: 0 10*3/uL (ref 0.0–0.1)
Basophils Relative: 0 %
Eosinophils Absolute: 0 10*3/uL (ref 0.0–0.5)
Eosinophils Relative: 0 %
HCT: 27.1 % — ABNORMAL LOW (ref 39.0–52.0)
Hemoglobin: 8.5 g/dL — ABNORMAL LOW (ref 13.0–17.0)
Immature Granulocytes: 2 %
Lymphocytes Relative: 4 %
Lymphs Abs: 0.2 10*3/uL — ABNORMAL LOW (ref 0.7–4.0)
MCH: 29 pg (ref 26.0–34.0)
MCHC: 31.4 g/dL (ref 30.0–36.0)
MCV: 92.5 fL (ref 80.0–100.0)
Monocytes Absolute: 0.1 10*3/uL (ref 0.1–1.0)
Monocytes Relative: 3 %
Neutro Abs: 4.3 10*3/uL (ref 1.7–7.7)
Neutrophils Relative %: 91 %
Platelets: 51 10*3/uL — ABNORMAL LOW (ref 150–400)
RBC: 2.93 MIL/uL — ABNORMAL LOW (ref 4.22–5.81)
RDW: 19 % — ABNORMAL HIGH (ref 11.5–15.5)
Smear Review: NORMAL
WBC: 4.7 10*3/uL (ref 4.0–10.5)
nRBC: 0 % (ref 0.0–0.2)

## 2023-07-23 LAB — TYPE AND SCREEN
ABO/RH(D): O POS
Antibody Screen: NEGATIVE

## 2023-07-23 LAB — URINALYSIS, ROUTINE W REFLEX MICROSCOPIC
Bilirubin Urine: NEGATIVE
Glucose, UA: NEGATIVE mg/dL
Hgb urine dipstick: NEGATIVE
Ketones, ur: NEGATIVE mg/dL
Leukocytes,Ua: NEGATIVE
Nitrite: NEGATIVE
Protein, ur: NEGATIVE mg/dL
Specific Gravity, Urine: 1.018 (ref 1.005–1.030)
pH: 5 (ref 5.0–8.0)

## 2023-07-23 LAB — APTT: aPTT: 33 s (ref 24–36)

## 2023-07-23 MED ORDER — SULFAMETHOXAZOLE-TRIMETHOPRIM 800-160 MG PO TABS: 1.0000 | ORAL_TABLET | Freq: Two times a day (BID) | ORAL | 1 refills | Status: AC

## 2023-07-23 MED ORDER — SULFAMETHOXAZOLE-TRIMETHOPRIM 800-160 MG PO TABS
1.0000 | ORAL_TABLET | Freq: Once | ORAL | Status: AC
  Administered 2023-07-23: 1 via ORAL
  Filled 2023-07-23: qty 1

## 2023-07-23 MED ORDER — LIDOCAINE (ANORECTAL) 5 % EX GEL: 1.0000 | CUTANEOUS | 1 refills | Status: DC | PRN

## 2023-07-23 MED ORDER — METAMUCIL SMOOTH TEXTURE 58.6 % PO POWD: 1.0000 | Freq: Every day | ORAL | 0 refills | Status: AC

## 2023-07-23 NOTE — Telephone Encounter (Signed)
Call returned to patient and advised him that he needs to go to ER as dircted by Dr Servando Snare, we agree with this plan. He stated he understands and agreed to go to ER.

## 2023-07-23 NOTE — ED Provider Triage Note (Signed)
Emergency Medicine Provider Triage Evaluation Note  Chris George , a 67 y.o. male  was evaluated in triage.  Pt complains of perineal pain. Patient has a history of prostate and colon cancer. He had a stroke 2 months ago caused by afib. He was told he has a "tear" and was given some cream to put on it. Patient has noticed blood in his stool that is sometimes bright red and sometimes dark. He does have pain with bowel movements. He has also noticed blood in his urine.   Patient's "tear" sounds like an anal fissure.  Review of Systems  Positive: Pain, hematuria, hematochezia, melena Negative: Chest pain, SOB  Physical Exam  There were no vitals taken for this visit. Gen:   Awake, no distress   Resp:  Normal effort  MSK:   Moves extremities without difficulty  Other:    Medical Decision Making  Medically screening exam initiated at 2:27 PM.  Appropriate orders placed.  Chris George was informed that the remainder of the evaluation will be completed by another provider, this initial triage assessment does not replace that evaluation, and the importance of remaining in the ED until their evaluation is complete.    Cameron Ali, PA-C 07/23/23 1436

## 2023-07-23 NOTE — ED Provider Notes (Signed)
[facility] Provider Note    Event Date/Time   First MD Initiated Contact with Patient 07/23/23 1558     (approximate)   History   Groin Pain   HPI  Chris George is a 67 y.o. male   Past medical history of multiple myeloma, colon cancer, prostate cancer, atrial fibrillation on Eliquis, prior CVA, here with rectal pain.  Ongoing for several weeks, with some rectal bleeding, seen by gastroenterology on 07/15/2023 and given nifedipine cream for rectal fissure but continues to have significant pain both on the outside of his anus and in his rectum when straining to make a bowel movement.  Some associated bleeding.  He denies fevers or chills.  No abdominal pain.  No dysuria.  External Medical Documents Reviewed: GI note from Dr. Daleen Squibb dated 07/15/2023 when he prescribed nifedipine cream for rectal fissure, as well as an oncology note from 07/17/2023 documented past medical history oncologic history and treatment history      Physical Exam   Triage Vital Signs: ED Triage Vitals  Encounter Vitals Group     BP 07/23/23 1427 (!) 131/99     Systolic BP Percentile --      Diastolic BP Percentile --      Pulse Rate 07/23/23 1427 (!) 122     Resp 07/23/23 1427 17     Temp 07/23/23 1427 98 F (36.7 C)     Temp Source 07/23/23 1427 Oral     SpO2 07/23/23 1427 99 %     Weight 07/23/23 1430 207 lb 0.2 oz (93.9 kg)     Height 07/23/23 1430 5\' 9"  (1.753 m)     Head Circumference --      Peak Flow --      Pain Score 07/23/23 1429 0     Pain Loc --      Pain Education --      Exclude from Growth Chart --     Most recent vital signs: Vitals:   07/23/23 1700 07/23/23 1730  BP: 102/61 103/68  Pulse: (!) 53 (!) 56  Resp: 16 18  Temp:    SpO2: 100% 100%    General: Awake, no distress.  CV:  Good peripheral perfusion.  Resp:  Normal effort.  Abd:  No distention.  Other:  Awake alert comfortable appearing no acute distress.  Intermittent bradycardia, sinus,  patient with a history of the same as he notes.  Abdomen is soft and nontender, there are fissures along the rectum with no active bleeding.  Digital rectal exam was hardly tolerated by the patient with significant pain so was unable to fully palpate the prostate.   ED Results / Procedures / Treatments   Labs (all labs ordered are listed, but only abnormal results are displayed) Labs Reviewed  URINALYSIS, ROUTINE W REFLEX MICROSCOPIC - Abnormal; Notable for the following components:      Result Value   Color, Urine YELLOW (*)    APPearance HAZY (*)    All other components within normal limits  COMPREHENSIVE METABOLIC PANEL - Abnormal; Notable for the following components:   Glucose, Bld 102 (*)    BUN 28 (*)    Creatinine, Ser 1.31 (*)    Calcium 8.3 (*)    Albumin 3.2 (*)    Alkaline Phosphatase 177 (*)    Total Bilirubin 1.7 (*)    GFR, Estimated 60 (*)    All other components within normal limits  CBC WITH DIFFERENTIAL/PLATELET - Abnormal; Notable  for the following components:   RBC 2.93 (*)    Hemoglobin 8.5 (*)    HCT 27.1 (*)    RDW 19.0 (*)    Platelets 51 (*)    Lymphs Abs 0.2 (*)    All other components within normal limits  APTT  TYPE AND SCREEN     I ordered and reviewed the above labs they are notable for white blood cell count within normal limits  EKG  ED ECG REPORT I, Pilar Jarvis, the attending physician, personally viewed and interpreted this ECG.   Date: 07/23/2023  EKG Time: 1626  Rate: 39  Rhythm: sinus bradycardia  Axis: nl  Intervals: rbbb  ST&T Change: no stemi  PROCEDURES:  Critical Care performed: No  Procedures   MEDICATIONS ORDERED IN ED: Medications - No data to display  IMPRESSION / MDM / ASSESSMENT AND PLAN / ED COURSE  I reviewed the triage vital signs and the nursing notes.                                Patient's presentation is most consistent with acute presentation with potential threat to life or bodily  function.  Differential diagnosis includes, but is not limited to, rectal fissure, prostatitis, lower GI bleeding, acute blood loss anemia, radiation proctitis   MDM: Patient with known rectal fissures refractory pain after using nifedipine cream as prescribed by his GI doctor.  History of prostate cancer question radiation proctitis, prostatitis, acute blood loss anemia.  Fortunately labs within normal limits patient appears nontoxic and normal vital signs aside from his known bradycardia.  He needs to follow-up with his GI doctor I will make urgent referral for further treatment as noted from his last visit for further evaluation.  I considered prostatitis as well could not fully evaluate due to pain with digital rectal exam, immunocompromised patient with history of prostate cancer will empirically treat with Bactrim and have him follow-up with urology for further assessment.  Will start with a 2-week course and have him follow-up with both his primary doctor and urologist and extend for full 28 course as needed.        FINAL CLINICAL IMPRESSION(S) / ED DIAGNOSES   Final diagnoses:  Rectal fissure  Rectal pain  Rectal bleeding     Rx / DC Orders   ED Discharge Orders          Ordered    Ambulatory referral to Gastroenterology        07/23/23 1818             Note:  This document was prepared using Dragon voice recognition software and may include unintentional dictation errors.    Pilar Jarvis, MD 07/23/23 [phone]

## 2023-07-23 NOTE — Discharge Instructions (Addendum)
Take antibiotics as prescribed and follow-up by calling your primary doctor this week as well as urologist Dr. Lonna Cobb for further assessment of whether this may represent a prostate inflammation/infection.  They can advise on further antibiotic treatment as needed.  In terms of a rectal fissure, follow-up with GI.  I made an urgent referral and they should call you for a follow-up appointment.    Continue taking the nifedipine cream.  Use lidocaine anesthetic and applied to the anus to help with pain. Take fiber supplement I prescribed for you  Thank you for choosing [place] for your health care today!  Please see your primary doctor this week for a follow up appointment.   If you have any new, worsening, or unexpected symptoms call your doctor right away or come back to the emergency department for reevaluation.  It was my pleasure to care for you today.   Daneil Dan Modesto Charon, MD

## 2023-07-23 NOTE — Telephone Encounter (Signed)
Patient called reporting that he thought he had hemorrhoids and went to see doctor who said he had a laceration and gave him a cream to use. He is no better and is having severe pain and bleeding every time he uses the bathroom. He is asking [place] what his next steps should be. I see in his chart that he did call Dr Vilma Prader office about same and was advised to go to the ER. Please advise.

## 2023-07-23 NOTE — ED Triage Notes (Signed)
Pt here with prostate pain. Pt has hx of prostate cancer and a stroke 2 months ago. Pt was told that he had a tear close to his prostate, pt states he is bleeding every time he urinates. Pt states he bleeds when he urinates and has a bowel movement, no clots seen by pt.

## 2023-07-23 NOTE — Telephone Encounter (Signed)
Patient called in to schedule an urgent appointment he stated that the new medication is making him feel worst. I advised him to go to the ER.

## 2023-07-25 ENCOUNTER — Ambulatory Visit: Payer: Medicare HMO

## 2023-07-25 DIAGNOSIS — R4701 Aphasia: Secondary | ICD-10-CM

## 2023-07-25 DIAGNOSIS — R41841 Cognitive communication deficit: Secondary | ICD-10-CM | POA: Diagnosis not present

## 2023-07-25 DIAGNOSIS — R482 Apraxia: Secondary | ICD-10-CM

## 2023-07-25 NOTE — Therapy (Signed)
OUTPATIENT SPEECH LANGUAGE PATHOLOGY APHASIA TREATMENT   Patient Name: Chris George MRN: [id number] DOB:09-13-1956, 67 y.o., male Today's Date: 07/25/2023  PCP: Vallarie Mare REFERRING PROVIDER: Maryjane Hurter, MD   End of Session - 07/25/23 1446     Visit Number 19    Number of Visits 48    Date for SLP Re-Evaluation 10/07/23    Progress Note Due on Visit 20    SLP Start Time 1403    SLP Stop Time  1445    SLP Time Calculation (min) 42 min             Past Medical History:  Diagnosis Date   A-fib (HCC)    Anemia    Arthritis    ankles   Cancer (HCC)    multiple myeloma   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HCC) 10/26/2019   Cardiomyopathy (HCC)    Chemotherapy-induced peripheral neuropathy (HCC)    CHF (congestive heart failure) (HCC)    Depression    Dysrhythmia    atrial fib   History of CVA (cerebrovascular accident)    HLD (hyperlipidemia)    Hypercholesteremia    Hypertension    Moderate tricuspid insufficiency    Multiple myeloma (HCC)    not being treated right now per pt 11/11/19   Pneumonia    Sleep apnea    No CPAP.  OSA resolved with wt loss.   Past Surgical History:  Procedure Laterality Date   ATRIAL ABLATION SURGERY     CARDIAC SURGERY     A-Fib Ablations   COLONOSCOPY WITH PROPOFOL N/A 10/26/2019   Procedure: COLONOSCOPY WITH BIOPSY;  Surgeon: [name], MD;  Location: [facility];  Service: Endoscopy;  Laterality: N/A;   COLONOSCOPY WITH PROPOFOL N/A 09/03/2022   Procedure: COLONOSCOPY WITH PROPOFOL;  Surgeon: [name], MD;  Location: [facility];  Service: Endoscopy;  Laterality: N/A;   ESOPHAGOGASTRODUODENOSCOPY (EGD) WITH PROPOFOL N/A 10/26/2019   Procedure: ESOPHAGOGASTRODUODENOSCOPY (EGD) WITH BIOPSY;  Surgeon: [name], MD;  Location: [facility];  Service: Endoscopy;  Laterality: N/A;  sleep apnea   LAPAROSCOPIC PARTIAL COLECTOMY Right 11/17/2019   Procedure: LAPAROSCOPIC PARTIAL COLECTOMY RIGHT EXTENDED;   Surgeon: [name], MD;  Location: [facility] OR[facility];  Service: General;  Laterality: Right;   PORTACATH PLACEMENT N/A 12/28/2019   Procedure: INSERTION PORT-A-CATH Left subclavian;  Surgeon: [name], MD;  Location: [facility];  Service: General;  Laterality: N/A;   Patient Active Problem List   Diagnosis Date Noted   HFrEF (heart failure with reduced ejection fraction) (HCC) 06/12/2023   Drug-induced polyneuropathy (HCC) 04/22/2023   Hepatic cirrhosis (HCC) 04/22/2023   Major depressive disorder, single episode, mild (HCC) 04/22/2023   Malignant neoplasm of prostate (HCC) 04/22/2023   Secondary hyperparathyroidism of renal origin (HCC) 04/22/2023   History of CVA (cerebrovascular accident) 04/05/2023   Personal history of colon cancer    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   Diabetes mellitus type 2, controlled, without complications (HCC) 08/07/2021   Neuropathy 08/30/2020   Port-A-Cath in place    Abnormal ECG 11/04/2019   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HCC) 10/27/2019   Goals of care, counseling/discussion 05/18/2019   Multiple myeloma (HCC) 05/17/2019   Anemia 05/12/2019   Arthritis of left ankle 02/05/2018   Cardiomyopathy, idiopathic (HCC) 01/07/2018   Moderate tricuspid insufficiency 01/01/2017   OSA (obstructive sleep apnea) 01/01/2017   Chronic a-fib (HCC) 12/06/2015   Pure hypercholesterolemia 02/09/2015   Anticoagulant long-term use 04/29/2014   Hyperlipidemia 04/29/2014   Hypertension, essential,  benign 04/26/2014    ONSET DATE: 04/05/23  REFERRING DIAG: aphasia  THERAPY DIAG:  Aphasia  Apraxia of speech  Rationale for Evaluation and Treatment Rehabilitation  SUBJECTIVE:   SUBJECTIVE STATEMENT: Pt alert and cooperative. Reports successful verbal communication with many community members Licensed conveyancer, Psychologist, prison and probation services, Curator, MD) in stressful situations this past week. Endorses not feeling well, but feels glad to come to ST session.  Pt  accompanied by: self  PERTINENT HISTORY: Patient is a 67 y.o. 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 Pt with PMHx of ca of the transverse colon, multiple myeloma, lymphoma, DM, HTN, HLD, CKD, and recently diagnosed prostate cancer. Pt undergoing radiation tx for cancer and reports plan to possibly resume chemotherapy.   DIAGNOSTIC FINDINGS:  MRI brain 04/06/23, "MRI brain findings-Acute infarct in the left parietal lobe with small focus of infarct in the right temporal lobe (5:41, 5:38) evident on diffusion-weighted imaging and FLAIR sequences. A few nodular foci of low signal on susceptibility weighted imaging in the region of left parietal infarct (for example 17:46) may represent small areas of microhemorrhage or cortical vessel thrombus.Mild cerebral volume loss with ex vacuo dilatation of the CSF-containing spaces. There are scattered in confluent foci of signal abnormality within the periventricular and deep white matter.  These are nonspecific but commonly seen with small vessel ischemic changes. There is no midline shift. No extra-axial fluid collection. No mass. There is no abnormal enhancement."  PAIN:  Are you having pain? No  FALLS: Has patient fallen in last 6 months?  No  LIVING ENVIRONMENT: Lives with: lives alone Lives in: House/apartment  PLOF:  Level of assistance: Independent with ADLs, Independent with IADLs Employment: Retired   PATIENT GOALS    to be able to participate in complex conversation with family and friends; to understanding what he is reading; to improve QoL; to be more independent   OBJECTIVE:  TODAY'S TREATMENT:  Skilled SLP treatment provided targeting expressive communication and writing.   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 Revisions and hesitation noted with successful repair of communication breakdowns for either anomia or  apraxia of speech >95% of the time. Rare cues for wordfinding/sequencing of sounds. Supportive counseling provided re: current speech/language deficits, communication strategies, effects of physical health on cognitive-linguistic functioning, and progress to date. Pt appreciative of SLP for listening.   Pt able to write a short story with writing prompt and word bank of 10 words with significantly extra time. Semantic paraphasias and misspellings appreciated. Pt benefits from reading aloud to ID and amend errors. Min cues to identify and amend all errors. Reviewed strategies such as use of spell check, grammar check, and reading written text aloud.     PATIENT EDUCATION: Education details: as above Person educated: Patient Education method: Explanation Education comprehension: verbalized understanding; needs further reinforcement  HOME EXERCISE PROGRAM:   Writing HEP given    GOALS:  Goals reviewed with patient? Yes  SHORT TERM GOALS: Target date: 10 sessions; revised goal - additional 10 weeks  Patient will demonstrate improved expressive language skills during communication breakdowns by using multimodal means (semantic feature analysis, circumlocution, synonym/antonym use, gestures, writing, drawing) to repair with mod cues. Baseline: Goal status: MET  2.  With Moderate A, patient will complete a semantic feature analysis with at least 3-5 relevant features for 8/10 target words with cues to improve word-finding skills.  Baseline:  Goal status: MET  3.  With min A, patient will generate sentences with 5 or more words during structured task at 80% accuracy in order to increase ability to communicate basic wants and needs.  Baseline:  Goal status: MET  4.  Pt will participate in further assessment of functional reading and writing to help determine level of assistance needed for language based iADL tasks. Baseline:  Goal status: MET  5.  Patient will follow two step commands  70% acc in context of a functional activity.  Baseline:  Goal status: MET  **New goals added 06/03/23; target date: 10 sessions   6. Pt will co-create x2 scripts for improved participation in medical appointments and                  social events with moderate assistance.    Baseline:    Goal status: MET                7. Pt will create simple text messages to desired communication partners with min A.    Baseline:    Goal status: MET  **New goals added 9/9; target 10 sessions   8. Pt will compose simple emails to desired communication partners with min A. .    Baseline:   Goal status: IN PROGRESS    9. Pt will utilize compensations for improved reading comprehension/retention with min A   Baseline:    Goal status: IN PROGRESS   10. Pt will communicate complex ideas (e.g. multiple viewpoints, pros/cons) with min A.    Baseline:   Goal status: IN PROGRESS   LONG TERM GOALS: Target date: 12 weeks; new date 10/07/23  Patient will demonstrate knowledge of appropriate activities to support functional receptive and expressive language outside of ST with assistance from family.  Baseline:  Goal status: MET  2.  Patient will report improved confidence in conversations outside of home (appointments, friends, on the phone) than prior to ST, as measured by Communication Effectiveness Survey.  Baseline:  15/32; 21/32 Goal status: REVISED; continue goal x1  3.  Patient and/or family will report use of strategies outside of ST to improve communication (use of scripts, pre-planning, semantic feature analysis, supported conversation).  Baseline:  Goal status: MET  4.  Patient will demonstrate improved ability with describing events, storytelling, or sharing medical information through personalized strategies (requesting time, using SFA, drawing, etc) as measured by Communication Effectiveness Survey.  Baseline:  Goal status: MET  **New goals initiated 9/9:  5. Pt will report use of  compensations for preferred writing and reading tasks.   Baseline:   Goals status: INITIAL  6. Pt will communicate complex ideas with modified independence.  Baseline: Goal status: INITIAL  ASSESSMENT:  CLINICAL IMPRESSION: Patient is a 67 y.o. 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 Pt making good progress toward goals despite ongoing medical problems and personal stressors. Pt remains highly motivated to achieve ST goals. 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   OBJECTIVE IMPAIRMENTS include expressive language, receptive language, and aphasia. These impairments are limiting patient from managing medications, managing appointments, managing finances, household responsibilities, ADLs/IADLs, and effectively communicating at home and in community. Factors affecting potential to  achieve goals and functional outcome are co-morbidities. Patient will benefit from skilled SLP services to address above impairments and improve overall function.  REHAB POTENTIAL: Excellent  PLAN: SLP FREQUENCY: 2x/week  SLP DURATION: 12 weeks  PLANNED INTERVENTIONS: Cueing hierachy, Internal/external aids, Functional tasks, Multimodal communication approach, SLP instruction and feedback, Compensatory strategies, and Patient/family education    Clyde Canterbury, M.S., CCC-SLP Speech-Language Pathologist  [facility] Out[facility] at [facility] [address] [place], [place], [address] Phone: [phone]   Fax:  [phone]

## 2023-07-29 ENCOUNTER — Ambulatory Visit: Payer: Medicare HMO

## 2023-07-29 MED FILL — Fluorouracil IV Soln 2.5 GM/50ML (50 MG/ML): INTRAVENOUS | Qty: 16 | Status: AC

## 2023-07-30 MED FILL — Dexamethasone Sodium Phosphate Inj 100 MG/10ML: INTRAMUSCULAR | Qty: 1 | Status: AC

## 2023-07-30 MED FILL — Fluorouracil IV Soln 5 GM/100ML (50 MG/ML): INTRAVENOUS | Qty: 100 | Status: AC

## 2023-07-31 ENCOUNTER — Inpatient Hospital Stay (HOSPITAL_BASED_OUTPATIENT_CLINIC_OR_DEPARTMENT_OTHER): Payer: Medicare HMO | Admitting: Oncology

## 2023-07-31 ENCOUNTER — Inpatient Hospital Stay: Payer: Medicare HMO

## 2023-07-31 ENCOUNTER — Encounter: Payer: Self-pay | Admitting: Oncology

## 2023-07-31 VITALS — BP 89/50 | HR 64 | Temp 96.5°F | Resp 16 | Ht 69.0 in | Wt 204.0 lb

## 2023-07-31 VITALS — BP 104/73 | HR 61 | Temp 97.2°F | Resp 17

## 2023-07-31 DIAGNOSIS — D649 Anemia, unspecified: Secondary | ICD-10-CM

## 2023-07-31 DIAGNOSIS — C184 Malignant neoplasm of transverse colon: Secondary | ICD-10-CM

## 2023-07-31 DIAGNOSIS — D509 Iron deficiency anemia, unspecified: Secondary | ICD-10-CM

## 2023-07-31 DIAGNOSIS — Z5111 Encounter for antineoplastic chemotherapy: Secondary | ICD-10-CM | POA: Diagnosis not present

## 2023-07-31 LAB — CMP (CANCER CENTER ONLY)
ALT: 16 U/L (ref 0–44)
AST: 20 U/L (ref 15–41)
Albumin: 3 g/dL — ABNORMAL LOW (ref 3.5–5.0)
Alkaline Phosphatase: 170 U/L — ABNORMAL HIGH (ref 38–126)
Anion gap: 4 — ABNORMAL LOW (ref 5–15)
BUN: 17 mg/dL (ref 8–23)
CO2: 21 mmol/L — ABNORMAL LOW (ref 22–32)
Calcium: 8.2 mg/dL — ABNORMAL LOW (ref 8.9–10.3)
Chloride: 108 mmol/L (ref 98–111)
Creatinine: 2.87 mg/dL — ABNORMAL HIGH (ref 0.61–1.24)
GFR, Estimated: 23 mL/min — ABNORMAL LOW (ref >60)
Glucose, Bld: 106 mg/dL — ABNORMAL HIGH (ref 70–99)
Potassium: 4.3 mmol/L (ref 3.5–5.1)
Sodium: 133 mmol/L — ABNORMAL LOW (ref 135–145)
Total Bilirubin: 0.8 mg/dL (ref 0.3–1.2)
Total Protein: 6.4 g/dL — ABNORMAL LOW (ref 6.5–8.1)

## 2023-07-31 LAB — CBC WITH DIFFERENTIAL (CANCER CENTER ONLY)
Abs Immature Granulocytes: 0.8 10*3/uL — ABNORMAL HIGH (ref 0.00–0.07)
Basophils Absolute: 0 10*3/uL (ref 0.0–0.1)
Basophils Relative: 0 %
Blasts: 1 %
Eosinophils Absolute: 0 10*3/uL (ref 0.0–0.5)
Eosinophils Relative: 0 %
HCT: 24.1 % — ABNORMAL LOW (ref 39.0–52.0)
Hemoglobin: 7.7 g/dL — ABNORMAL LOW (ref 13.0–17.0)
Lymphocytes Relative: 3 %
Lymphs Abs: 0.3 10*3/uL — ABNORMAL LOW (ref 0.7–4.0)
MCH: 29.6 pg (ref 26.0–34.0)
MCHC: 32 g/dL (ref 30.0–36.0)
MCV: 92.7 fL (ref 80.0–100.0)
Metamyelocytes Relative: 2 %
Monocytes Absolute: 0.9 10*3/uL (ref 0.1–1.0)
Monocytes Relative: 10 %
Myelocytes: 5 %
Neutro Abs: 7.3 10*3/uL (ref 1.7–7.7)
Neutrophils Relative %: 78 %
Platelet Count: 139 10*3/uL — ABNORMAL LOW (ref 150–400)
Promyelocytes Relative: 1 %
RBC: 2.6 MIL/uL — ABNORMAL LOW (ref 4.22–5.81)
RDW: 19.6 % — ABNORMAL HIGH (ref 11.5–15.5)
Smear Review: NORMAL
WBC Count: 9.4 10*3/uL (ref 4.0–10.5)
nRBC: 0.4 % — ABNORMAL HIGH (ref 0.0–0.2)

## 2023-07-31 LAB — PATHOLOGIST SMEAR REVIEW

## 2023-07-31 LAB — MAGNESIUM: Magnesium: 1.7 mg/dL (ref 1.7–2.4)

## 2023-07-31 LAB — PREPARE RBC (CROSSMATCH)

## 2023-07-31 MED ORDER — SODIUM CHLORIDE 0.9 % IV SOLN
Freq: Once | INTRAVENOUS | Status: AC
  Filled 2023-07-31: qty 250

## 2023-07-31 MED ORDER — HEPARIN SOD (PORK) LOCK FLUSH 100 UNIT/ML IV SOLN
500.0000 [IU] | Freq: Once | INTRAVENOUS | Status: AC | PRN
  Administered 2023-07-31: 500 [IU]
  Filled 2023-07-31: qty 5

## 2023-07-31 NOTE — Progress Notes (Signed)
[facility]  Telephone:([phone]) [phone] Fax:(336) [phone]  ID: Chris George OB: 1956/09/09  MR#: [id number]  KVQ#:[id number]  Patient Care Team: Marina Goodell, MD as PCP - General (Family Medicine) [name], MD as Consulting Physician (Oncology) Morene Crocker, MD as Referring Physician (Neurology) Lamar Blinks, MD as Consulting Physician (Cardiology)  CHIEF COMPLAINT: Recurrent colon cancer, smoldering myeloma, prostate cancer.  INTERVAL HISTORY: Patient returns to clinic today for further evaluation and consideration of cycle 6 of FOLFIRI.  He has increased weakness and fatigue, but otherwise feels well.  He continues to have a mild expressive aphasia and a chronic peripheral neuropathy, but no other neurologic complaints. He denies any recent fevers or illnesses.  He has a good appetite and denies weight loss.  He has no chest pain, shortness of breath, cough, or hemoptysis.  He denies any nausea, vomiting, constipation, or diarrhea.  He has no melena or hematochezia.  He has no urinary complaints.  Patient offers no further specific complaints today.  REVIEW OF SYSTEMS:   Review of Systems  Constitutional:  Positive for malaise/fatigue. Negative for fever and weight loss.  Respiratory: Negative.  Negative for cough, hemoptysis and shortness of breath.   Cardiovascular: Negative.  Negative for chest pain and leg swelling.  Gastrointestinal: Negative.  Negative for blood in stool, diarrhea and melena.  Genitourinary: Negative.  Negative for hematuria.  Musculoskeletal: Negative.  Negative for back pain and joint pain.  Skin: Negative.  Negative for rash.  Neurological:  Positive for tingling, sensory change and weakness. Negative for dizziness, focal weakness and headaches.  Psychiatric/Behavioral: Negative.  Negative for depression. The patient is not nervous/anxious.     As per HPI. Otherwise, a complete review of systems is negative.  PAST  MEDICAL HISTORY: Past Medical History:  Diagnosis Date   A-fib (HCC)    Anemia    Arthritis    ankles   Cancer (HCC)    multiple myeloma   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HCC) 10/26/2019   Cardiomyopathy (HCC)    Chemotherapy-induced peripheral neuropathy (HCC)    CHF (congestive heart failure) (HCC)    Depression    Dysrhythmia    atrial fib   History of CVA (cerebrovascular accident)    HLD (hyperlipidemia)    Hypercholesteremia    Hypertension    Moderate tricuspid insufficiency    Multiple myeloma (HCC)    not being treated right now per pt 11/11/19   Pneumonia    Sleep apnea    No CPAP.  OSA resolved with wt loss.    PAST SURGICAL HISTORY: Past Surgical History:  Procedure Laterality Date   ATRIAL ABLATION SURGERY     CARDIAC SURGERY     A-Fib Ablations   COLONOSCOPY WITH PROPOFOL N/A 10/26/2019   Procedure: COLONOSCOPY WITH BIOPSY;  Surgeon: [name], MD;  Location: [facility];  Service: Endoscopy;  Laterality: N/A;   COLONOSCOPY WITH PROPOFOL N/A 09/03/2022   Procedure: COLONOSCOPY WITH PROPOFOL;  Surgeon: [name], MD;  Location: [facility];  Service: Endoscopy;  Laterality: N/A;   ESOPHAGOGASTRODUODENOSCOPY (EGD) WITH PROPOFOL N/A 10/26/2019   Procedure: ESOPHAGOGASTRODUODENOSCOPY (EGD) WITH BIOPSY;  Surgeon: [name], MD;  Location: [facility];  Service: Endoscopy;  Laterality: N/A;  sleep apnea   LAPAROSCOPIC PARTIAL COLECTOMY Right 11/17/2019   Procedure: LAPAROSCOPIC PARTIAL COLECTOMY RIGHT EXTENDED;  Surgeon: [name], MD;  Location: [facility];  Service: General;  Laterality: Right;   PORTACATH PLACEMENT N/A 12/28/2019  Procedure: INSERTION PORT-A-CATH Left subclavian;  Surgeon: [name], MD;  Location: [facility] OR[facility];  Service: General;  Laterality: N/A;    FAMILY HISTORY: Family History  Problem Relation Age of Onset   Healthy Mother     ADVANCED DIRECTIVES (Y/N):  N  HEALTH MAINTENANCE: Social History    Tobacco Use   Smoking status: Never    Passive exposure: Never   Smokeless tobacco: Never  Vaping Use   Vaping status: Never Used  Substance Use Topics   Alcohol use: Not Currently   Drug use: Never     Colonoscopy:  PAP:  Bone density:  Lipid panel:  No Known Allergies  Current Outpatient Medications  Medication Sig Dispense Refill   allopurinol (ZYLOPRIM) 100 MG tablet Take 200 mg by mouth daily.     apixaban (ELIQUIS) 5 MG TABS tablet Take 5 mg by mouth 2 (two) times daily.     APPLE CIDER VINEGAR PO Take 30-45 mLs by mouth every other day.      atorvastatin (LIPITOR) 80 MG tablet Take 80 mg by mouth daily.     colchicine 0.6 MG tablet Take 1 tablet (0.6 mg total) by mouth 2 (two) times daily. Until flare resolves. 20 tablet 1   furosemide (LASIX) 80 MG tablet Take 40 mg by mouth daily as needed for edema.     gabapentin (NEURONTIN) 100 MG capsule Take 300 mg by mouth 2 (two) times daily.     HYDROcodone-acetaminophen (NORCO) 5-325 MG tablet Take 1 tablet by mouth every 6 (six) hours as needed for moderate pain. 30 tablet 0   hydrocortisone (ANUSOL-HC) 2.5 % rectal cream Place 1 Application rectally 2 (two) times daily. 30 g 0   hydrocortisone cream 1 % Apply 1 Application topically 2 (two) times daily.     Lidocaine, Anorectal, 5 % GEL Apply 1 Application topically as needed. 30 g 1   lidocaine-prilocaine (EMLA) cream Apply 1 Application topically daily.     loperamide (IMODIUM A-D) 2 MG tablet Take 2 mg by mouth 4 (four) times daily as needed for diarrhea or loose stools.     MAGNESIUM PO Take by mouth.     Misc Natural Products (TART CHERRY ADVANCED PO) Take 60 mLs by mouth every other day.     Multiple Vitamins-Minerals (ZINC PO) Take by mouth.     nifedipine 0.3 % ointment Place 1 Application rectally 4 (four) times daily. with Lidocaine 1.5% 30 g 0   potassium bicarbonate (K-LYTE) 25 MEQ disintegrating tablet Take 25 mEq by mouth 2 (two) times daily. In the  afternoon.     prochlorperazine (COMPAZINE) 10 MG tablet Take 1 tablet (10 mg total) by mouth every 6 (six) hours as needed for nausea or vomiting. 60 tablet 1   psyllium (METAMUCIL SMOOTH TEXTURE) 58.6 % powder Take 1 packet by mouth daily for 20 days. 20 packet 0   sulfamethoxazole-trimethoprim (BACTRIM DS) 800-160 MG tablet Take 1 tablet by mouth 2 (two) times daily for 14 days. 28 tablet 1   tamsulosin (FLOMAX) 0.4 MG CAPS capsule Take 1 capsule (0.4 mg total) by mouth daily after supper. 30 capsule 6   traZODone (DESYREL) 50 MG tablet Take 1 tablet (50 mg total) by mouth at bedtime. Use caution if used with the norco pain medication 30 tablet 2   Zinc Sulfate (ZINC 15 PO) Take by mouth.     No current facility-administered medications for this visit.   Facility-Administered Medications Ordered in Other Visits  Medication Dose  Route Frequency Provider Last Rate Last Admin   heparin lock flush 100 unit/mL  500 Units Intravenous Once [name], MD        OBJECTIVE: Vitals:   07/31/23 0845  BP: (!) 89/50  Pulse: 64  Resp: 16  Temp: (!) 96.5 F (35.8 C)  SpO2: 100%       Body mass index is 30.13 kg/m.    ECOG FS:1 - Symptomatic but completely ambulatory  General: Well-developed, well-nourished, no acute distress. Eyes: Pink conjunctiva, anicteric sclera. HEENT: Normocephalic, moist mucous membranes. Lungs: No audible wheezing or coughing. Heart: Regular rate and rhythm. Abdomen: Soft, nontender, no obvious distention. Musculoskeletal: No edema, cyanosis, or clubbing. Neuro: Alert, answering all questions appropriately. Cranial nerves grossly intact. Skin: No rashes or petechiae noted. Psych: Normal affect.  LAB RESULTS:  Lab Results  Component Value Date   NA 133 (L) 07/31/2023   K 4.3 07/31/2023   CL 108 07/31/2023   CO2 21 (L) 07/31/2023   GLUCOSE 106 (H) 07/31/2023   BUN 17 07/31/2023   CREATININE 2.87 (H) 07/31/2023   CALCIUM 8.2 (L) 07/31/2023   PROT  6.4 (L) 07/31/2023   ALBUMIN 3.0 (L) 07/31/2023   AST 20 07/31/2023   ALT 16 07/31/2023   ALKPHOS 170 (H) 07/31/2023   BILITOT 0.8 07/31/2023   GFRNONAA 23 (L) 07/31/2023   GFRAA >60 07/29/2020    Lab Results  Component Value Date   WBC 9.4 07/31/2023   NEUTROABS 7.3 07/31/2023   HGB 7.7 (L) 07/31/2023   HCT 24.1 (L) 07/31/2023   MCV 92.7 07/31/2023   PLT 139 (L) 07/31/2023   Lab Results  Component Value Date   IRON 124 07/09/2023   TIBC 288 07/09/2023   IRONPCTSAT 43 (H) 07/09/2023   Lab Results  Component Value Date   FERRITIN 616 (H) 07/09/2023     STUDIES: No results found.   ASSESSMENT: Stage IIIc colon cancer, smoldering myeloma, prostate cancer.  PLAN:    Stage IIIc colon cancer: Patient completed cycle 9 of adjuvant FOLFOX on June 03, 2020.  Given his difficulties with treatment and declining performance status, treatment was discontinued altogether. Colonoscopy on September 03, 2022 did not reveal any significant pathology and recommendation was to repeat in 3 years.  CT scan from December 07, 2022 reviewed independently with enlarging aortocaval node now measuring 1.9 x 1.4 cm.  PET scan results from Mar 15, 2023 reviewed independently with progressive retroperitoneal lymphadenopathy presumably related to patient's colon cancer.  Patient CEA trended up and peaked at 194, but now is trending down and his most recent result was 148.  Today's result is pending.  Delay cycle 6 of treatment today secondary to worsening creatinine and performance status. Treatment was previously dose reduced 10% given his renal insufficiency and baseline pancytopenia.  Will hold Avastin at this time given his recent stroke.  Patient will instead receive 1 L of IV fluids today.  Return to clinic tomorrow for 1 unit of blood.  He would then return to clinic in 1 week for further evaluation and reconsideration of cycle 6.  Plan to reimage at the conclusion of cycle 6. Prostate cancer: Gleason's  3+4.  Patient's PSA is increased, but relatively stable at 32.4.  Imaging as above.  Nuclear medicine bone scan on December 13, 2022 was reported as negative.  PSMA PET per radiation oncology from Mar 21, 2023 did not reveal metastatic disease.  Patient completed XRT for local control disease on May 30, 2023.  Smoldering myeloma: Chronic and unchanged.  Patient was initially diagnosed and treated in [place], [place] and treated with single agent Velcade in approximately 2013.  He declined maintenance treatment or referral for bone marrow transplant.  His M spike has ranged from 1.1-2.0 since July 2020.  His most recent result was 1.6.  Over the same timeframe his IgG component has ranged from [phone].  His most recent result is 2344.  Finally, his kappa free light chains have ranged from 24.8 -75.3 with his most recent result reported at 36.2.  Nuclear med bone scan as above.  His most recent bone marrow biopsy completed on May 28, 2019 revealed only 10 to 15% plasma cells with no clonality reported.  Cytogenetics were also reported as normal.  Continue to monitor closely.  Anemia: Despite receiving multiple units of blood recently, patient's hemoglobin continues to trend down.  He will return to clinic tomorrow for 1 unit packed red blood cells as above.   Neutropenia: Resolved.  Continue Udenyca with pump removal.  Dose reduced FOLFIRI as above. Thrombocytopenia: Platelets improved to 139.   Renal insufficiency: Creatinine significantly worse at 2.87 today.  Fluids and blood transfusion as above.  Neither irinotecan or 5-FU need to be dose reduced in the setting of renal insufficiency.  Patient reports he has not seen nephrology in several years.  If his creatinine continues to remain elevated, will give a referral back. Hypomagnesia: Resolved.  Patient's magnesium is 1.7 today.   Peripheral neuropathy: Chronic and unchanged.  Patient reports gabapentin did not help.  He was recently initiated on  Lyrica 50 mg daily and given a referral to neurology.   Hypotension: 1 L IV fluids as above. Coping/anxiety: Patient was given a referral to [facility].  Patient expressed understanding and was in agreement with this plan. He also understands that He can call clinic at any time with any questions, concerns, or complaints.    [name], MD   07/31/2023 11:16 AM

## 2023-08-01 ENCOUNTER — Ambulatory Visit: Payer: Medicare HMO

## 2023-08-01 ENCOUNTER — Inpatient Hospital Stay: Payer: Medicare HMO

## 2023-08-01 DIAGNOSIS — D649 Anemia, unspecified: Secondary | ICD-10-CM

## 2023-08-01 DIAGNOSIS — Z5111 Encounter for antineoplastic chemotherapy: Secondary | ICD-10-CM | POA: Diagnosis not present

## 2023-08-01 DIAGNOSIS — R4701 Aphasia: Secondary | ICD-10-CM

## 2023-08-01 DIAGNOSIS — R482 Apraxia: Secondary | ICD-10-CM

## 2023-08-01 DIAGNOSIS — R41841 Cognitive communication deficit: Secondary | ICD-10-CM | POA: Diagnosis not present

## 2023-08-01 LAB — TYPE AND SCREEN
ABO/RH(D): O POS
Antibody Screen: NEGATIVE
Unit division: 0
Unit division: 0

## 2023-08-01 LAB — BPAM RBC
Blood Product Expiration Date: 202410092359
Unit Type and Rh: 5100
Unit Type and Rh: 5100
Unit Type and Rh: 5100
Unit Type and Rh: 5100

## 2023-08-01 MED ORDER — DIPHENHYDRAMINE HCL 50 MG/ML IJ SOLN
25.0000 mg | Freq: Once | INTRAMUSCULAR | Status: DC
  Filled 2023-08-01: qty 1

## 2023-08-01 MED ORDER — SODIUM CHLORIDE 0.9% IV SOLUTION
250.0000 mL | Freq: Once | INTRAVENOUS | Status: AC
  Administered 2023-08-01: 250 mL via INTRAVENOUS
  Filled 2023-08-01: qty 250

## 2023-08-01 MED ORDER — DIPHENHYDRAMINE HCL 50 MG/ML IJ SOLN
12.5000 mg | Freq: Once | INTRAMUSCULAR | Status: AC
  Administered 2023-08-01: 12.5 mg via INTRAVENOUS

## 2023-08-01 MED ORDER — HEPARIN SOD (PORK) LOCK FLUSH 100 UNIT/ML IV SOLN
500.0000 [IU] | Freq: Every day | INTRAVENOUS | Status: AC | PRN
  Administered 2023-08-01: 500 [IU]
  Filled 2023-08-01: qty 5

## 2023-08-01 MED ORDER — ACETAMINOPHEN 325 MG PO TABS
650.0000 mg | ORAL_TABLET | Freq: Once | ORAL | Status: AC
  Administered 2023-08-01: 650 mg via ORAL
  Filled 2023-08-01: qty 2

## 2023-08-01 NOTE — Therapy (Signed)
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 Patient is making progress toward LTGs and met 2/2 STGs this reporting period. Pt making progress toward STGs added during re-certification on 9/9. See skilled intervention, clinical impressions, and goals below for details.   Patient Name: Chris George MRN: [id number] DOB:07-12-1956, 67 y.o., male Today's Date: 08/01/2023  PCP: Vallarie Mare REFERRING PROVIDER: Maryjane Hurter, MD   End of Session - 08/01/23 1125     Visit Number 20    Number of Visits 48    Date for SLP Re-Evaluation 10/07/23    Progress Note Due on Visit 30    SLP Start Time 0933    SLP Stop Time  1017    SLP Time Calculation (min) 44 min             Past Medical History:  Diagnosis Date   A-fib (HCC)    Anemia    Arthritis    ankles   Cancer (HCC)    multiple myeloma   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HCC) 10/26/2019   Cardiomyopathy (HCC)    Chemotherapy-induced peripheral neuropathy (HCC)    CHF (congestive heart failure) (HCC)    Depression    Dysrhythmia    atrial fib   History of CVA (cerebrovascular accident)    HLD (hyperlipidemia)    Hypercholesteremia    Hypertension    Moderate tricuspid insufficiency    Multiple myeloma (HCC)    not being treated right now per pt 11/11/19   Pneumonia    Sleep apnea    No CPAP.  OSA resolved with wt loss.   Past Surgical History:  Procedure Laterality Date   ATRIAL ABLATION SURGERY     CARDIAC SURGERY     A-Fib Ablations   COLONOSCOPY WITH PROPOFOL N/A 10/26/2019   Procedure: COLONOSCOPY WITH BIOPSY;  Surgeon: [name], MD;  Location: [facility];  Service: Endoscopy;  Laterality: N/A;   COLONOSCOPY WITH  PROPOFOL N/A 09/03/2022   Procedure: COLONOSCOPY WITH PROPOFOL;  Surgeon: [name], MD;  Location: [facility];  Service: Endoscopy;  Laterality: N/A;   ESOPHAGOGASTRODUODENOSCOPY (EGD) WITH PROPOFOL N/A 10/26/2019   Procedure: ESOPHAGOGASTRODUODENOSCOPY (EGD) WITH BIOPSY;  Surgeon: [name], MD;  Location: [facility];  Service: Endoscopy;  Laterality: N/A;  sleep apnea   LAPAROSCOPIC PARTIAL COLECTOMY Right 11/17/2019   Procedure: LAPAROSCOPIC PARTIAL COLECTOMY RIGHT EXTENDED;  Surgeon: [name], MD;  Location: [facility];  Service: General;  Laterality: Right;   PORTACATH PLACEMENT N/A 12/28/2019   Procedure: INSERTION PORT-A-CATH Left subclavian;  Surgeon: [name], MD;  Location: [facility];  Service: General;  Laterality: N/A;   Patient Active Problem List   Diagnosis Date Noted   HFrEF (heart failure with reduced ejection fraction) (HCC) 06/12/2023   Drug-induced polyneuropathy (HCC) 04/22/2023   Hepatic cirrhosis (HCC) 04/22/2023   Major depressive disorder, single episode, mild (HCC) 04/22/2023   Malignant neoplasm of prostate (HCC) 04/22/2023   Secondary hyperparathyroidism of renal origin (HCC) 04/22/2023   History of CVA (cerebrovascular accident) 04/05/2023   Personal history of colon cancer    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   Diabetes mellitus type 2, controlled, without complications (HCC) 08/07/2021   Neuropathy 08/30/2020   Port-A-Cath in place    Abnormal  ECG 11/04/2019   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HCC) 10/27/2019   Goals of care, counseling/discussion 05/18/2019   Multiple myeloma (HCC) 05/17/2019   Anemia 05/12/2019   Arthritis of left ankle 02/05/2018   Cardiomyopathy, idiopathic (HCC) 01/07/2018   Moderate tricuspid insufficiency 01/01/2017   OSA (obstructive sleep apnea) 01/01/2017   Chronic a-fib (HCC) 12/06/2015   Pure hypercholesterolemia 02/09/2015   Anticoagulant long-term use 04/29/2014    Hyperlipidemia 04/29/2014   Hypertension, essential, benign 04/26/2014    ONSET DATE: 04/05/23  REFERRING DIAG: aphasia  THERAPY DIAG:  Aphasia  Apraxia of speech  Rationale for Evaluation and Treatment Rehabilitation  SUBJECTIVE:   SUBJECTIVE STATEMENT: Pt alert and cooperative. Reports successful verbal and written communication with many community members and friends. Endorses overall health is "hit or miss" and that he chemo was delayed this week due to "labs."  Pt accompanied by: self  PERTINENT HISTORY: Patient is a 67 y.o. 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 Pt with PMHx of ca of the transverse colon, multiple myeloma, lymphoma, DM, HTN, HLD, CKD, and recently diagnosed prostate cancer. Pt undergoing radiation tx for cancer and reports plan to possibly resume chemotherapy.   DIAGNOSTIC FINDINGS:  MRI brain 04/06/23, "MRI brain findings-Acute infarct in the left parietal lobe with small focus of infarct in the right temporal lobe (5:41, 5:38) evident on diffusion-weighted imaging and FLAIR sequences. A few nodular foci of low signal on susceptibility weighted imaging in the region of left parietal infarct (for example 17:46) may represent small areas of microhemorrhage or cortical vessel thrombus.Mild cerebral volume loss with ex vacuo dilatation of the CSF-containing spaces. There are scattered in confluent foci of signal abnormality within the periventricular and deep white matter.  These are nonspecific but commonly seen with small vessel ischemic changes. There is no midline shift. No extra-axial fluid collection. No mass. There is no abnormal enhancement."  PAIN:  Are you having pain? No  FALLS: Has patient fallen in last 6 months?  No  LIVING ENVIRONMENT: Lives with: lives alone Lives in: House/apartment  PLOF:  Level of assistance: Independent with ADLs, Independent with IADLs Employment: Retired   PATIENT GOALS    to  be able to participate in complex conversation with family and friends; to understanding what he is reading; to improve QoL; to be more independent   OBJECTIVE:  TODAY'S TREATMENT:  Skilled SLP treatment provided targeting expressive communication and writing.   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 Revisions and hesitation noted with successful repair of communication breakdowns for either anomia or apraxia of speech >95% of the time. Rare cues for wordfinding/sequencing of sounds. Supportive counseling provided re: current speech/language deficits, communication strategies, effects of physical health on cognitive-linguistic functioning, and progress to date. Pt appreciative of SLP for listening.   Pt reports reading x2 Meribeth Mattes novels in the last few weeks. Pt reporting improved satisfaction with speed of reading, comprehension, and ability to synthesize what is written. Suspect improvements in working memory. Pt reported no longer needing to use compensations for reading comprehension.  Pt able to write a simple email with writing prompt with significantly extra time. Semantic paraphasias and misspellings appreciated. Pt benefits from reading aloud to ID and amend errors as well as use of word processor. Reviewed strategies such as use of spell check, grammar check, and reading written text aloud.     PATIENT EDUCATION: Education details: as  above Person educated: Patient Education method: Explanation Education comprehension: verbalized understanding; needs further reinforcement  HOME EXERCISE PROGRAM:   Writing HEP given    GOALS:  Goals reviewed with patient? Yes  SHORT TERM GOALS: Target date: 10 sessions; revised goal - additional 10 weeks  Patient will demonstrate improved expressive language skills during communication breakdowns by using multimodal means (semantic feature analysis,  circumlocution, synonym/antonym use, gestures, writing, drawing) to repair with mod cues. Baseline: Goal status: MET  2.  With Moderate A, patient will complete a semantic feature analysis with at least 3-5 relevant features for 8/10 target words with cues to improve word-finding skills.  Baseline:  Goal status: MET  3.  With min A, patient will generate sentences with 5 or more words during structured task at 80% accuracy in order to increase ability to communicate basic wants and needs.  Baseline:  Goal status: MET  4.  Pt will participate in further assessment of functional reading and writing to help determine level of assistance needed for language based iADL tasks. Baseline:  Goal status: MET  5.  Patient will follow two step commands 70% acc in context of a functional activity.  Baseline:  Goal status: MET  **New goals added 06/03/23; target date: 10 sessions   6. Pt will co-create x2 scripts for improved participation in medical appointments and                  social events with moderate assistance.    Baseline:    Goal status: MET                7. Pt will create simple text messages to desired communication partners with min A.    Baseline:    Goal status: MET  **New goals added 9/9; target 10 sessions   8. Pt will compose simple emails to desired communication partners with min A. .    Baseline:   Goal status: IN PROGRESS    9. Pt will utilize compensations for improved reading comprehension/retention with min A   Baseline:    Goal status: MET   10. Pt will communicate complex ideas (e.g. multiple viewpoints, pros/cons) with min A.    Baseline:   Goal status: IN PROGRESS   LONG TERM GOALS: Target date: 12 weeks; new date 10/07/23  Patient will demonstrate knowledge of appropriate activities to support functional receptive and expressive language outside of ST with assistance from family.  Baseline:  Goal status: MET  2.  Patient will report improved  confidence in conversations outside of home (appointments, friends, on the phone) than prior to ST, as measured by Communication Effectiveness Survey.  Baseline:  15/32; 21/32 Goal status: REVISED; continue goal x1  3.  Patient and/or family will report use of strategies outside of ST to improve communication (use of scripts, pre-planning, semantic feature analysis, supported conversation).  Baseline:  Goal status: MET  4.  Patient will demonstrate improved ability with describing events, storytelling, or sharing medical information through personalized strategies (requesting time, using SFA, drawing, etc) as measured by Communication Effectiveness Survey.  Baseline:  Goal status: MET  **New goals initiated 9/9:  5. Pt will report use of compensations for preferred writing and reading tasks.   Baseline:   Goals status: INITIAL  6. Pt will communicate complex ideas with modified independence.  Baseline: Goal status: INITIAL  ASSESSMENT:  CLINICAL IMPRESSION: Patient is a 67 y.o. male s/p L MCA distribution infarct and R punctate temporal lobe infarct on  04/05/23. Pt making good progress toward goals despite ongoing medical problems and personal stressors. Pt remains highly motivated to achieve ST goals. 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 Pt making good progress toward goals. See details of tx session aboveI recommend continued course of skilled SLP services targeting functional receptive and expressive communication to improve pt's participation in iADLs, preferred activities, and QoL.   OBJECTIVE IMPAIRMENTS include expressive language, receptive language, and aphasia. These impairments are limiting patient from managing medications, managing appointments, managing finances, household responsibilities, ADLs/IADLs, and effectively communicating at  home and in community. Factors affecting potential to achieve goals and functional outcome are co-morbidities. Patient will benefit from skilled SLP services to address above impairments and improve overall function.  REHAB POTENTIAL: Excellent  PLAN: SLP FREQUENCY: 2x/week  SLP DURATION: 12 weeks  PLANNED INTERVENTIONS: Cueing hierachy, Internal/external aids, Functional tasks, Multimodal communication approach, SLP instruction and feedback, Compensatory strategies, and Patient/family education    Clyde Canterbury, M.S., CCC-SLP Speech-Language Pathologist  [facility] at [facility] [address] [place], [place], [address] Phone: [phone]   Fax:  [phone]

## 2023-08-02 ENCOUNTER — Inpatient Hospital Stay: Payer: Medicare HMO

## 2023-08-02 NOTE — Progress Notes (Signed)
Chris George consented and enrolled for [facility] services on 08/02/2023.

## 2023-08-05 ENCOUNTER — Ambulatory Visit: Payer: Medicare HMO

## 2023-08-06 MED FILL — Dexamethasone Sodium Phosphate Inj 100 MG/10ML: INTRAMUSCULAR | Qty: 1 | Status: AC

## 2023-08-07 ENCOUNTER — Other Ambulatory Visit: Payer: Medicare HMO

## 2023-08-07 ENCOUNTER — Encounter: Payer: Self-pay | Admitting: Oncology

## 2023-08-07 ENCOUNTER — Inpatient Hospital Stay: Payer: Medicare HMO | Attending: Oncology | Admitting: Oncology

## 2023-08-07 ENCOUNTER — Ambulatory Visit: Payer: Medicare HMO | Admitting: Urology

## 2023-08-07 ENCOUNTER — Inpatient Hospital Stay: Payer: Medicare HMO

## 2023-08-07 DIAGNOSIS — C9000 Multiple myeloma not having achieved remission: Secondary | ICD-10-CM | POA: Insufficient documentation

## 2023-08-07 DIAGNOSIS — Z5189 Encounter for other specified aftercare: Secondary | ICD-10-CM | POA: Insufficient documentation

## 2023-08-07 DIAGNOSIS — R5383 Other fatigue: Secondary | ICD-10-CM | POA: Diagnosis not present

## 2023-08-07 DIAGNOSIS — K5900 Constipation, unspecified: Secondary | ICD-10-CM | POA: Insufficient documentation

## 2023-08-07 DIAGNOSIS — C61 Malignant neoplasm of prostate: Secondary | ICD-10-CM

## 2023-08-07 DIAGNOSIS — C184 Malignant neoplasm of transverse colon: Secondary | ICD-10-CM

## 2023-08-07 DIAGNOSIS — C182 Malignant neoplasm of ascending colon: Secondary | ICD-10-CM | POA: Insufficient documentation

## 2023-08-07 DIAGNOSIS — Z23 Encounter for immunization: Secondary | ICD-10-CM | POA: Diagnosis not present

## 2023-08-07 DIAGNOSIS — Z79899 Other long term (current) drug therapy: Secondary | ICD-10-CM | POA: Insufficient documentation

## 2023-08-07 DIAGNOSIS — Z5111 Encounter for antineoplastic chemotherapy: Secondary | ICD-10-CM | POA: Diagnosis present

## 2023-08-07 DIAGNOSIS — K602 Anal fissure, unspecified: Secondary | ICD-10-CM | POA: Insufficient documentation

## 2023-08-07 DIAGNOSIS — D649 Anemia, unspecified: Secondary | ICD-10-CM | POA: Insufficient documentation

## 2023-08-07 LAB — CBC WITH DIFFERENTIAL (CANCER CENTER ONLY)
Abs Immature Granulocytes: 0.09 10*3/uL — ABNORMAL HIGH (ref 0.00–0.07)
Basophils Absolute: 0 10*3/uL (ref 0.0–0.1)
Basophils Relative: 0 %
Eosinophils Absolute: 0.1 10*3/uL (ref 0.0–0.5)
Eosinophils Relative: 1 %
HCT: 25.1 % — ABNORMAL LOW (ref 39.0–52.0)
Hemoglobin: 8 g/dL — ABNORMAL LOW (ref 13.0–17.0)
Immature Granulocytes: 2 %
Lymphocytes Relative: 11 %
Lymphs Abs: 0.7 10*3/uL (ref 0.7–4.0)
MCH: 29.5 pg (ref 26.0–34.0)
MCHC: 31.9 g/dL (ref 30.0–36.0)
MCV: 92.6 fL (ref 80.0–100.0)
Monocytes Absolute: 0.6 10*3/uL (ref 0.1–1.0)
Monocytes Relative: 11 %
Neutro Abs: 4.4 10*3/uL (ref 1.7–7.7)
Neutrophils Relative %: 75 %
Platelet Count: 132 10*3/uL — ABNORMAL LOW (ref 150–400)
RBC: 2.71 MIL/uL — ABNORMAL LOW (ref 4.22–5.81)
RDW: 19.6 % — ABNORMAL HIGH (ref 11.5–15.5)
WBC Count: 5.9 10*3/uL (ref 4.0–10.5)
nRBC: 0.3 % — ABNORMAL HIGH (ref 0.0–0.2)

## 2023-08-07 LAB — MAGNESIUM: Magnesium: 1.6 mg/dL — ABNORMAL LOW (ref 1.7–2.4)

## 2023-08-07 LAB — CMP (CANCER CENTER ONLY)
ALT: 20 U/L (ref 0–44)
AST: 29 U/L (ref 15–41)
Albumin: 2.9 g/dL — ABNORMAL LOW (ref 3.5–5.0)
Alkaline Phosphatase: 186 U/L — ABNORMAL HIGH (ref 38–126)
Anion gap: 6 (ref 5–15)
BUN: 17 mg/dL (ref 8–23)
CO2: 21 mmol/L — ABNORMAL LOW (ref 22–32)
Calcium: 8.2 mg/dL — ABNORMAL LOW (ref 8.9–10.3)
Chloride: 108 mmol/L (ref 98–111)
Creatinine: 1.96 mg/dL — ABNORMAL HIGH (ref 0.61–1.24)
GFR, Estimated: 37 mL/min — ABNORMAL LOW (ref >60)
Glucose, Bld: 125 mg/dL — ABNORMAL HIGH (ref 70–99)
Potassium: 4.4 mmol/L (ref 3.5–5.1)
Sodium: 135 mmol/L (ref 135–145)
Total Bilirubin: 0.7 mg/dL (ref 0.3–1.2)
Total Protein: 6.4 g/dL — ABNORMAL LOW (ref 6.5–8.1)

## 2023-08-07 LAB — PSA: Prostatic Specific Antigen: 8.26 ng/mL — ABNORMAL HIGH (ref 0.00–4.00)

## 2023-08-07 LAB — SAMPLE TO BLOOD BANK

## 2023-08-07 MED ORDER — MAGNESIUM SULFATE 2 GM/50ML IV SOLN
2.0000 g | Freq: Once | INTRAVENOUS | Status: AC
  Administered 2023-08-07: 2 g via INTRAVENOUS
  Filled 2023-08-07: qty 50

## 2023-08-07 MED ORDER — SODIUM CHLORIDE 0.9 % IV SOLN
2160.0000 mg/m2 | INTRAVENOUS | Status: DC
  Administered 2023-08-07: 5000 mg via INTRAVENOUS
  Filled 2023-08-07: qty 100

## 2023-08-07 MED ORDER — ATROPINE SULFATE 1 MG/ML IV SOLN
0.5000 mg | Freq: Once | INTRAVENOUS | Status: AC | PRN
  Administered 2023-08-07: 0.5 mg via INTRAVENOUS
  Filled 2023-08-07: qty 1

## 2023-08-07 MED ORDER — INFLUENZA VAC A&B SURF ANT ADJ 0.5 ML IM SUSY
0.5000 mL | PREFILLED_SYRINGE | Freq: Once | INTRAMUSCULAR | Status: AC
  Administered 2023-08-07: 0.5 mL via INTRAMUSCULAR
  Filled 2023-08-07: qty 0.5

## 2023-08-07 MED ORDER — PALONOSETRON HCL INJECTION 0.25 MG/5ML
0.2500 mg | Freq: Once | INTRAVENOUS | Status: AC
  Administered 2023-08-07: 0.25 mg via INTRAVENOUS
  Filled 2023-08-07: qty 5

## 2023-08-07 MED ORDER — SODIUM CHLORIDE 0.9 % IV SOLN
160.0000 mg/m2 | Freq: Once | INTRAVENOUS | Status: AC
  Administered 2023-08-07: 340 mg via INTRAVENOUS
  Filled 2023-08-07: qty 15

## 2023-08-07 MED ORDER — SODIUM CHLORIDE 0.9 % IV SOLN
INTRAVENOUS | Status: DC
  Filled 2023-08-07: qty 250

## 2023-08-07 MED ORDER — FLUOROURACIL CHEMO INJECTION 2.5 GM/50ML
360.0000 mg/m2 | Freq: Once | INTRAVENOUS | Status: AC
  Administered 2023-08-07: 800 mg via INTRAVENOUS
  Filled 2023-08-07: qty 16

## 2023-08-07 MED ORDER — SODIUM CHLORIDE 0.9 % IV SOLN
360.0000 mg/m2 | Freq: Once | INTRAVENOUS | Status: AC
  Administered 2023-08-07: 782 mg via INTRAVENOUS
  Filled 2023-08-07: qty 39.1

## 2023-08-07 MED ORDER — SODIUM CHLORIDE 0.9 % IV SOLN
Freq: Once | INTRAVENOUS | Status: AC
  Filled 2023-08-07: qty 250

## 2023-08-07 MED ORDER — SODIUM CHLORIDE 0.9 % IV SOLN
10.0000 mg | Freq: Once | INTRAVENOUS | Status: AC
  Administered 2023-08-07: 10 mg via INTRAVENOUS
  Filled 2023-08-07: qty 1
  Filled 2023-08-07: qty 10

## 2023-08-07 NOTE — Patient Instructions (Signed)
[place] [facility]  Discharge Instructions: Thank you for choosing [facility] to provide your oncology and hematology care.  If you have a lab appointment with the Cancer Center, please go directly to the Cancer Center and check in at the registration area.  Wear comfortable clothing and clothing appropriate for easy access to any Portacath or PICC line.   We strive to give you quality time with your provider. You may need to reschedule your appointment if you arrive late (15 or more minutes).  Arriving late affects you and other patients whose appointments are after yours.  Also, if you miss three or more appointments without notifying the office, you may be dismissed from the clinic at the provider's discretion.      For prescription refill requests, have your pharmacy contact our office and allow 72 hours for refills to be completed.    Today you received the following chemotherapy and/or immunotherapy agents Irinotecan, Leucovorin, 5FU.      To help prevent nausea and vomiting after your treatment, we encourage you to take your nausea medication as directed.  BELOW ARE SYMPTOMS THAT SHOULD BE REPORTED IMMEDIATELY: *FEVER GREATER THAN 100.4 F (38 C) OR HIGHER *CHILLS OR SWEATING *NAUSEA AND VOMITING THAT IS NOT CONTROLLED WITH YOUR NAUSEA MEDICATION *UNUSUAL SHORTNESS OF BREATH *UNUSUAL BRUISING OR BLEEDING *URINARY PROBLEMS (pain or burning when urinating, or frequent urination) *BOWEL PROBLEMS (unusual diarrhea, constipation, pain near the anus) TENDERNESS IN MOUTH AND THROAT WITH OR WITHOUT PRESENCE OF ULCERS (sore throat, sores in mouth, or a toothache) UNUSUAL RASH, SWELLING OR PAIN  UNUSUAL VAGINAL DISCHARGE OR ITCHING   Items with * indicate a potential emergency and should be followed up as soon as possible or go to the Emergency Department if any problems should occur.  Please show the CHEMOTHERAPY ALERT CARD or IMMUNOTHERAPY ALERT  CARD at check-in to the Emergency Department and triage nurse.  Should you have questions after your visit or need to cancel or reschedule your appointment, please contact [place] [facility]  [phone] and follow the prompts.  Office hours are 8:00 a.m. to 4:30 p.m. Monday - Friday. Please note that voicemails left after 4:00 p.m. may not be returned until the following business day.  We are closed weekends and major holidays. You have access to a nurse at all times for urgent questions. Please call the main number to the clinic [phone] and follow the prompts.  For any non-urgent questions, you may also contact your provider using MyChart. We now offer e-Visits for anyone 51 and older to request care online for non-urgent symptoms. For details visit [email or web address].   Also download the MyChart app! Go to the app store, search "MyChart", open the app, select , and log in with your MyChart username and password.

## 2023-08-07 NOTE — Progress Notes (Signed)
[facility]  [phone]) [phone] [phone]) [phone]  ID: Chris George OB: [birth year]  MR#: [id number]  ION#:[id number]  Patient Care Team: Marina Goodell, MD as PCP - General (Family Medicine) [name], MD as Consulting Physician (Oncology) Morene Crocker, MD as Referring Physician (Neurology) Lamar Blinks, MD as Consulting Physician (Cardiology)  CHIEF COMPLAINT: Recurrent colon cancer, smoldering myeloma, prostate cancer.  INTERVAL HISTORY: Patient returns to clinic today for further evaluation and reconsideration of cycle 6 of FOLFIRI.  He continues to have chronic weakness and fatigue, but it has improved since receiving a blood transfusion last week.  He continues to have a mild expressive aphasia and a chronic peripheral neuropathy, but no other neurologic complaints. He denies any recent fevers or illnesses.  He has a good appetite and denies weight loss.  He has no chest pain, shortness of breath, cough, or hemoptysis.  He denies any nausea, vomiting, constipation, or diarrhea.  He has no melena or hematochezia.  He has no urinary complaints.  Patient offers no further specific complaints today.  REVIEW OF SYSTEMS:   Review of Systems  Constitutional:  Positive for malaise/fatigue. Negative for fever and weight loss.  Respiratory: Negative.  Negative for cough, hemoptysis and shortness of breath.   Cardiovascular: Negative.  Negative for chest pain and leg swelling.  Gastrointestinal: Negative.  Negative for blood in stool, diarrhea and melena.  Genitourinary: Negative.  Negative for hematuria.  Musculoskeletal: Negative.  Negative for back pain and joint pain.  Skin: Negative.  Negative for rash.  Neurological:  Positive for tingling, sensory change and weakness. Negative for dizziness, focal weakness and headaches.  Psychiatric/Behavioral: Negative.  Negative for depression. The patient is not nervous/anxious.     As per HPI.  Otherwise, a complete review of systems is negative.  PAST MEDICAL HISTORY: Past Medical History:  Diagnosis Date   A-fib (HCC)    Anemia    Arthritis    ankles   Cancer (HCC)    multiple myeloma   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HCC) 10/26/2019   Cardiomyopathy (HCC)    Chemotherapy-induced peripheral neuropathy (HCC)    CHF (congestive heart failure) (HCC)    Depression    Dysrhythmia    atrial fib   History of CVA (cerebrovascular accident)    HLD (hyperlipidemia)    Hypercholesteremia    Hypertension    Moderate tricuspid insufficiency    Multiple myeloma (HCC)    not being treated right now per pt 11/11/19   Pneumonia    Sleep apnea    No CPAP.  OSA resolved with wt loss.    PAST SURGICAL HISTORY: Past Surgical History:  Procedure Laterality Date   ATRIAL ABLATION SURGERY     CARDIAC SURGERY     A-Fib Ablations   COLONOSCOPY WITH PROPOFOL N/A 10/26/2019   Procedure: COLONOSCOPY WITH BIOPSY;  Surgeon: [name], MD;  Location: [facility];  Service: Endoscopy;  Laterality: N/A;   COLONOSCOPY WITH PROPOFOL N/A 09/03/2022   Procedure: COLONOSCOPY WITH PROPOFOL;  Surgeon: [name], MD;  Location: [facility];  Service: Endoscopy;  Laterality: N/A;   ESOPHAGOGASTRODUODENOSCOPY (EGD) WITH PROPOFOL N/A 10/26/2019   Procedure: ESOPHAGOGASTRODUODENOSCOPY (EGD) WITH BIOPSY;  Surgeon: [name], MD;  Location: [facility];  Service: Endoscopy;  Laterality: N/A;  sleep apnea   LAPAROSCOPIC PARTIAL COLECTOMY Right 11/17/2019   Procedure: LAPAROSCOPIC PARTIAL COLECTOMY RIGHT EXTENDED;  Surgeon: [name], MD;  Location: [facility];  Service: General;  Laterality:  Right;   PORTACATH PLACEMENT N/A 12/28/2019   Procedure: INSERTION PORT-A-CATH Left subclavian;  Surgeon: [name], MD;  Location: [facility];  Service: General;  Laterality: N/A;    FAMILY HISTORY: Family History  Problem Relation Age of Onset   Healthy Mother     ADVANCED  DIRECTIVES (Y/N):  N  HEALTH MAINTENANCE: Social History   Tobacco Use   Smoking status: Never    Passive exposure: Never   Smokeless tobacco: Never  Vaping Use   Vaping status: Never Used  Substance Use Topics   Alcohol use: Not Currently   Drug use: Never     Colonoscopy:  PAP:  Bone density:  Lipid panel:  No Known Allergies  Current Outpatient Medications  Medication Sig Dispense Refill   allopurinol (ZYLOPRIM) 100 MG tablet Take 200 mg by mouth daily.     apixaban (ELIQUIS) 5 MG TABS tablet Take 5 mg by mouth 2 (two) times daily.     APPLE CIDER VINEGAR PO Take 30-45 mLs by mouth every other day.      atorvastatin (LIPITOR) 80 MG tablet Take 80 mg by mouth daily.     colchicine 0.6 MG tablet Take 1 tablet (0.6 mg total) by mouth 2 (two) times daily. Until flare resolves. 20 tablet 1   gabapentin (NEURONTIN) 100 MG capsule Take 300 mg by mouth 2 (two) times daily.     HYDROcodone-acetaminophen (NORCO) 5-325 MG tablet Take 1 tablet by mouth every 6 (six) hours as needed for moderate pain. 30 tablet 0   hydrocortisone (ANUSOL-HC) 2.5 % rectal cream Place 1 Application rectally 2 (two) times daily. 30 g 0   hydrocortisone cream 1 % Apply 1 Application topically 2 (two) times daily.     Lidocaine, Anorectal, 5 % GEL Apply 1 Application topically as needed. 30 g 1   lidocaine-prilocaine (EMLA) cream Apply 1 Application topically daily.     loperamide (IMODIUM A-D) 2 MG tablet Take 2 mg by mouth 4 (four) times daily as needed for diarrhea or loose stools.     MAGNESIUM PO Take by mouth.     Misc Natural Products (TART CHERRY ADVANCED PO) Take 60 mLs by mouth every other day.     Multiple Vitamins-Minerals (ZINC PO) Take by mouth.     nifedipine 0.3 % ointment Place 1 Application rectally 4 (four) times daily. with Lidocaine 1.5% 30 g 0   potassium bicarbonate (K-LYTE) 25 MEQ disintegrating tablet Take 25 mEq by mouth 2 (two) times daily. In the afternoon.     prochlorperazine  (COMPAZINE) 10 MG tablet Take 1 tablet (10 mg total) by mouth every 6 (six) hours as needed for nausea or vomiting. 60 tablet 1   psyllium (METAMUCIL SMOOTH TEXTURE) 58.6 % powder Take 1 packet by mouth daily for 20 days. 20 packet 0   tamsulosin (FLOMAX) 0.4 MG CAPS capsule Take 1 capsule (0.4 mg total) by mouth daily after supper. 30 capsule 6   Zinc Sulfate (ZINC 15 PO) Take by mouth.     furosemide (LASIX) 80 MG tablet Take 40 mg by mouth daily as needed for edema. (Patient not taking: Reported on 08/07/2023)     traZODone (DESYREL) 50 MG tablet Take 1 tablet (50 mg total) by mouth at bedtime. Use caution if used with the norco pain medication (Patient not taking: Reported on 08/07/2023) 30 tablet 2   No current facility-administered medications for this visit.   Facility-Administered Medications Ordered in Other Visits  Medication Dose Route Frequency  Provider Last Rate Last Admin   heparin lock flush 100 unit/mL  500 Units Intravenous Once [name], MD        OBJECTIVE: Vitals:   08/07/23 0828  BP: (!) 103/52  Pulse: 75  Resp: 16  Temp: (!) 96.7 F (35.9 C)  SpO2: 100%       Body mass index is 31.16 kg/m.    ECOG FS:1 - Symptomatic but completely ambulatory  General: Well-developed, well-nourished, no acute distress. Eyes: Pink conjunctiva, anicteric sclera. HEENT: Normocephalic, moist mucous membranes. Lungs: No audible wheezing or coughing. Heart: Regular rate and rhythm. Abdomen: Soft, nontender, no obvious distention. Musculoskeletal: No edema, cyanosis, or clubbing. Neuro: Alert, answering all questions appropriately. Cranial nerves grossly intact. Skin: No rashes or petechiae noted. Psych: Normal affect.  LAB RESULTS:  Lab Results  Component Value Date   NA 135 08/07/2023   K 4.4 08/07/2023   CL 108 08/07/2023   CO2 21 (L) 08/07/2023   GLUCOSE 125 (H) 08/07/2023   BUN 17 08/07/2023   CREATININE 1.96 (H) 08/07/2023   CALCIUM 8.2 (L) 08/07/2023    PROT 6.4 (L) 08/07/2023   ALBUMIN 2.9 (L) 08/07/2023   AST 29 08/07/2023   ALT 20 08/07/2023   ALKPHOS 186 (H) 08/07/2023   BILITOT 0.7 08/07/2023   GFRNONAA 37 (L) 08/07/2023   GFRAA >60 07/29/2020    Lab Results  Component Value Date   WBC 5.9 08/07/2023   NEUTROABS 4.4 08/07/2023   HGB 8.0 (L) 08/07/2023   HCT 25.1 (L) 08/07/2023   MCV 92.6 08/07/2023   PLT 132 (L) 08/07/2023   Lab Results  Component Value Date   IRON 124 07/09/2023   TIBC 288 07/09/2023   IRONPCTSAT 43 (H) 07/09/2023   Lab Results  Component Value Date   FERRITIN 616 (H) 07/09/2023     STUDIES: No results found.  ONCOLOGY HISTORY: Patient completed cycle 9 of adjuvant FOLFOX on June 03, 2020.  Given his difficulties with treatment and declining performance status, treatment was discontinued altogether. Colonoscopy on September 03, 2022 did not reveal any significant pathology and recommendation was to repeat in 3 years.    ASSESSMENT: Stage IIIc colon cancer, smoldering myeloma, prostate cancer.  PLAN:    Stage IIIc colon cancer:  CT scan from December 07, 2022 reviewed independently with enlarging aortocaval node now measuring 1.9 x 1.4 cm.  PET scan results from Mar 15, 2023 reviewed independently with progressive retroperitoneal lymphadenopathy presumably related to patient's colon cancer.  Patient CEA trended up and peaked at 194, but now is trending down and his most recent result was 148.  Today's result is pending.  Proceed with cycle 6 of treatment today. Treatment was previously dose reduced 10% given his renal insufficiency and baseline pancytopenia.  Will hold Avastin at this time given his recent stroke.  Patient will also receive IV magnesium.  Return to clinic in 2 days for pump removal and then in 2 weeks for further evaluation and consideration of cycle 7.  Patient will have repeat PET scan on approximately August 13, 2023.   Prostate cancer: Gleason's 3+4.  Patient's PSA is increased, but  relatively stable at 32.4.  PSA pending at time of dictation.  Nuclear medicine bone scan on December 13, 2022 was reported as negative.  PSMA PET per radiation oncology from Mar 21, 2023 did not reveal metastatic disease.  Patient completed XRT for local control disease on May 30, 2023.  Repeat imaging as above.  Patient  has an appointment with urology on August 14, 2023. Smoldering myeloma: Chronic and unchanged.  Patient was initially diagnosed and treated in [name], [place] and treated with single agent Velcade in approximately 2013.  He declined maintenance treatment or referral for bone marrow transplant.  His M spike has ranged from 1.1-2.0 since July 2020.  His most recent result was 1.6.  Over the same timeframe his IgG component has ranged from [phone].  His most recent result is 2344.  Finally, his kappa free light chains have ranged from 24.8 -75.3 with his most recent result reported at 36.2.  Nuclear med bone scan as above.  His most recent bone marrow biopsy completed on May 28, 2019 revealed only 10 to 15% plasma cells with no clonality reported.  Cytogenetics were also reported as normal.  Continue to monitor closely.  Anemia: Hemoglobin improved to 8.0 with 1 unit of packed red blood cells last week.  Monitor. Neutropenia: Resolved.  Continue Udenyca with pump removal.  Dose reduced FOLFIRI as above. Thrombocytopenia: Chronic and unchanged.  Patient's platelet count is 132. Renal insufficiency: Creatinine has improved to 1.96.  Neither irinotecan or 5-FU need to be dose reduced in the setting of renal insufficiency.  Patient reports he has not seen nephrology in several years.  If his creatinine continues to remain elevated, will give a referral back. Hypomagnesia: Magnesium is 1.6 today.  Proceed with 2 g IV magnesium. Peripheral neuropathy: Chronic and unchanged.  Patient reports gabapentin did not help.  He was recently initiated on Lyrica 50 mg daily and given a referral to  neurology.   Hypotension: Improved.   Coping/anxiety: Patient was previously given a referral to [facility].  Patient expressed understanding and was in agreement with this plan. He also understands that He can call clinic at any time with any questions, concerns, or complaints.    [name], MD   08/07/2023 9:14 AM

## 2023-08-08 ENCOUNTER — Ambulatory Visit: Payer: Medicare HMO | Attending: Family Medicine

## 2023-08-08 ENCOUNTER — Telehealth: Payer: Self-pay | Admitting: *Deleted

## 2023-08-08 DIAGNOSIS — R4701 Aphasia: Secondary | ICD-10-CM | POA: Diagnosis present

## 2023-08-08 DIAGNOSIS — R482 Apraxia: Secondary | ICD-10-CM | POA: Diagnosis present

## 2023-08-08 LAB — CEA: CEA: 60.7 ng/mL — ABNORMAL HIGH (ref 0.0–4.7)

## 2023-08-08 NOTE — Telephone Encounter (Signed)
Patient called and left a message that he had treatment yesterday and today he had an explosive fecal incontinence and he ended up with it all over him. He had asked for a return call; I called back and got a message that he was not available and his voice mail was full

## 2023-08-08 NOTE — Telephone Encounter (Signed)
Patient called back and we discussed avoiding greasy spicy foods and waiting to see if this is a one time episode. He agrees to watch his diet and to let us if he continues to have diarrhea.

## 2023-08-08 NOTE — Therapy (Signed)
OUTPATIENT SPEECH LANGUAGE PATHOLOGY APHASIA TREATMENT     Patient Name: Chris George MRN: [id number] DOB:August 22, 1956, 67 y.o., male Today's Date: 08/08/2023  PCP: Vallarie Mare REFERRING PROVIDER: Maryjane Hurter, MD   End of Session - 08/08/23 1202     Visit Number 21    Number of Visits 48    Date for SLP Re-Evaluation 10/07/23    Progress Note Due on Visit 30    SLP Start Time 1100    SLP Stop Time  1150    SLP Time Calculation (min) 50 min    Activity Tolerance Patient tolerated treatment well             Past Medical History:  Diagnosis Date   A-fib (HCC)    Anemia    Arthritis    ankles   Cancer (HCC)    multiple myeloma   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HCC) 10/26/2019   Cardiomyopathy (HCC)    Chemotherapy-induced peripheral neuropathy (HCC)    CHF (congestive heart failure) (HCC)    Depression    Dysrhythmia    atrial fib   History of CVA (cerebrovascular accident)    HLD (hyperlipidemia)    Hypercholesteremia    Hypertension    Moderate tricuspid insufficiency    Multiple myeloma (HCC)    not being treated right now per pt 11/11/19   Pneumonia    Sleep apnea    No CPAP.  OSA resolved with wt loss.   Past Surgical History:  Procedure Laterality Date   ATRIAL ABLATION SURGERY     CARDIAC SURGERY     A-Fib Ablations   COLONOSCOPY WITH PROPOFOL N/A 10/26/2019   Procedure: COLONOSCOPY WITH BIOPSY;  Surgeon: [name], MD;  Location: [facility];  Service: Endoscopy;  Laterality: N/A;   COLONOSCOPY WITH PROPOFOL N/A 09/03/2022   Procedure: COLONOSCOPY WITH PROPOFOL;  Surgeon: [name], MD;  Location: [facility];  Service: Endoscopy;  Laterality: N/A;   ESOPHAGOGASTRODUODENOSCOPY (EGD) WITH PROPOFOL N/A 10/26/2019   Procedure: ESOPHAGOGASTRODUODENOSCOPY (EGD) WITH BIOPSY;  Surgeon: [name], MD;  Location: [facility];  Service: Endoscopy;  Laterality: N/A;  sleep apnea   LAPAROSCOPIC PARTIAL COLECTOMY Right 11/17/2019    Procedure: LAPAROSCOPIC PARTIAL COLECTOMY RIGHT EXTENDED;  Surgeon: [name], MD;  Location: [facility];  Service: General;  Laterality: Right;   PORTACATH PLACEMENT N/A 12/28/2019   Procedure: INSERTION PORT-A-CATH Left subclavian;  Surgeon: [name], MD;  Location: [facility];  Service: General;  Laterality: N/A;   Patient Active Problem List   Diagnosis Date Noted   HFrEF (heart failure with reduced ejection fraction) (HCC) 06/12/2023   Drug-induced polyneuropathy (HCC) 04/22/2023   Hepatic cirrhosis (HCC) 04/22/2023   Major depressive disorder, single episode, mild (HCC) 04/22/2023   Malignant neoplasm of prostate (HCC) 04/22/2023   Secondary hyperparathyroidism of renal origin (HCC) 04/22/2023   History of CVA (cerebrovascular accident) 04/05/2023   Personal history of colon cancer    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   Diabetes mellitus type 2, controlled, without complications (HCC) 08/07/2021   Neuropathy 08/30/2020   Port-A-Cath in place    Abnormal ECG 11/04/2019   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HCC) 10/27/2019   Goals of care, counseling/discussion 05/18/2019   Multiple myeloma (HCC) 05/17/2019   Anemia 05/12/2019   Arthritis of left ankle 02/05/2018   Cardiomyopathy, idiopathic (HCC) 01/07/2018   Moderate tricuspid insufficiency 01/01/2017   OSA (obstructive sleep apnea) 01/01/2017   Chronic a-fib (HCC) 12/06/2015   Pure hypercholesterolemia 02/09/2015   Anticoagulant  long-term use 04/29/2014   Hyperlipidemia 04/29/2014   Hypertension, essential, benign 04/26/2014    ONSET DATE: 04/05/23  REFERRING DIAG: aphasia  THERAPY DIAG:  Aphasia  Apraxia of speech  Rationale for Evaluation and Treatment Rehabilitation  SUBJECTIVE:   SUBJECTIVE STATEMENT: Pt alert and cooperative. Reports successful verbal and written communication with many community members and friends. Endorses overall health is "hit or miss" and that he chemo was delayed  this week due to "labs."  Pt accompanied by: self  PERTINENT HISTORY: Patient is a 67 y.o. 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 Pt with PMHx of ca of the transverse colon, multiple myeloma, lymphoma, DM, HTN, HLD, CKD, and recently diagnosed prostate cancer. Pt undergoing radiation tx for cancer and reports plan to possibly resume chemotherapy.   DIAGNOSTIC FINDINGS:  MRI brain 04/06/23, "MRI brain findings-Acute infarct in the left parietal lobe with small focus of infarct in the right temporal lobe (5:41, 5:38) evident on diffusion-weighted imaging and FLAIR sequences. A few nodular foci of low signal on susceptibility weighted imaging in the region of left parietal infarct (for example 17:46) may represent small areas of microhemorrhage or cortical vessel thrombus.Mild cerebral volume loss with ex vacuo dilatation of the CSF-containing spaces. There are scattered in confluent foci of signal abnormality within the periventricular and deep white matter.  These are nonspecific but commonly seen with small vessel ischemic changes. There is no midline shift. No extra-axial fluid collection. No mass. There is no abnormal enhancement."  PAIN:  Are you having pain? No  FALLS: Has patient fallen in last 6 months?  No  LIVING ENVIRONMENT: Lives with: lives alone Lives in: House/apartment  PLOF:  Level of assistance: Independent with ADLs, Independent with IADLs Employment: Retired   PATIENT GOALS    to be able to participate in complex conversation with family and friends; to understanding what he is reading; to improve QoL; to be more independent   OBJECTIVE:  TODAY'S TREATMENT:  Skilled SLP treatment provided targeting expressive communication and writing.   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 Revisions and hesitation noted with successful repair of communication  breakdowns for either anomia or apraxia of speech >95% of the time. Rare cues for wordfinding/sequencing of sounds. Supportive counseling provided re: current speech/language deficits, communication strategies, effects of physical health on cognitive-linguistic functioning, and progress to date. Pt appreciative of SLP for listening.   Pt able to write a story with x10 target words and writing prompt with significantly extra time. Semantic paraphasias and misspellings appreciated. Min cueing for error amendment. Pt benefits from reading aloud to ID and amend errors as well as use of word processor. Reviewed strategies such as use of spell check, grammar check, and reading written text aloud.     PATIENT EDUCATION: Education details: as above Person educated: Patient Education method: Explanation Education comprehension: verbalized understanding; needs further reinforcement  HOME EXERCISE PROGRAM:   Writing HEP given    GOALS:  Goals reviewed with patient? Yes  SHORT TERM GOALS: Target date: 10 sessions; revised goal - additional 10 weeks  Patient will demonstrate improved expressive language skills during communication breakdowns by using multimodal means (semantic feature analysis, circumlocution, synonym/antonym use, gestures, writing, drawing) to repair with mod cues. Baseline: Goal status: MET  2.  With Moderate A, patient will complete a semantic feature analysis with at least 3-5 relevant features for 8/10 target words with  cues to improve word-finding skills.  Baseline:  Goal status: MET  3.  With min A, patient will generate sentences with 5 or more words during structured task at 80% accuracy in order to increase ability to communicate basic wants and needs.  Baseline:  Goal status: MET  4.  Pt will participate in further assessment of functional reading and writing to help determine level of assistance needed for language based iADL tasks. Baseline:  Goal status:  MET  5.  Patient will follow two step commands 70% acc in context of a functional activity.  Baseline:  Goal status: MET  **New goals added 06/03/23; target date: 10 sessions   6. Pt will co-create x2 scripts for improved participation in medical appointments and                  social events with moderate assistance.    Baseline:    Goal status: MET                7. Pt will create simple text messages to desired communication partners with min A.    Baseline:    Goal status: MET  **New goals added 9/9; target 10 sessions   8. Pt will compose simple emails to desired communication partners with min A. .    Baseline:   Goal status: IN PROGRESS    9. Pt will utilize compensations for improved reading comprehension/retention with min A   Baseline:    Goal status: MET   10. Pt will communicate complex ideas (e.g. multiple viewpoints, pros/cons) with min A.    Baseline:   Goal status: IN PROGRESS   LONG TERM GOALS: Target date: 12 weeks; new date 10/07/23  Patient will demonstrate knowledge of appropriate activities to support functional receptive and expressive language outside of ST with assistance from family.  Baseline:  Goal status: MET  2.  Patient will report improved confidence in conversations outside of home (appointments, friends, on the phone) than prior to ST, as measured by Communication Effectiveness Survey.  Baseline:  15/32; 21/32 Goal status: REVISED; continue goal x1  3.  Patient and/or family will report use of strategies outside of ST to improve communication (use of scripts, pre-planning, semantic feature analysis, supported conversation).  Baseline:  Goal status: MET  4.  Patient will demonstrate improved ability with describing events, storytelling, or sharing medical information through personalized strategies (requesting time, using SFA, drawing, etc) as measured by Communication Effectiveness Survey.  Baseline:  Goal status: MET  **New goals  initiated 9/9:  5. Pt will report use of compensations for preferred writing and reading tasks.   Baseline:   Goals status: INITIAL  6. Pt will communicate complex ideas with modified independence.  Baseline: Goal status: INITIAL  ASSESSMENT:  CLINICAL IMPRESSION: Patient is a 67 y.o. 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 Pt making good progress toward goals despite ongoing medical problems and personal stressors. Pt remains highly motivated to achieve ST goals. 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 Pt making good progress toward goals. See details of tx session aboveI recommend continued course of skilled SLP services targeting functional receptive and expressive communication to improve pt's participation in iADLs, preferred activities, and QoL.   OBJECTIVE IMPAIRMENTS include expressive language, receptive language, and aphasia. These impairments are limiting patient from managing medications, managing  appointments, managing finances, household responsibilities, ADLs/IADLs, and effectively communicating at home and in community. Factors affecting potential to achieve goals and functional outcome are co-morbidities. Patient will benefit from skilled SLP services to address above impairments and improve overall function.  REHAB POTENTIAL: Excellent  PLAN: SLP FREQUENCY: 2x/week  SLP DURATION: 12 weeks  PLANNED INTERVENTIONS: Cueing hierachy, Internal/external aids, Functional tasks, Multimodal communication approach, SLP instruction and feedback, Compensatory strategies, and Patient/family education    Clyde Canterbury, M.S., CCC-SLP Speech-Language Pathologist  [facility] [address] [place], [place], [address] Phone: [phone]    Fax:  [phone]

## 2023-08-09 ENCOUNTER — Other Ambulatory Visit: Payer: Self-pay | Admitting: Oncology

## 2023-08-09 ENCOUNTER — Inpatient Hospital Stay: Payer: Medicare HMO

## 2023-08-09 VITALS — BP 104/48 | HR 97 | Temp 97.0°F

## 2023-08-09 DIAGNOSIS — C184 Malignant neoplasm of transverse colon: Secondary | ICD-10-CM

## 2023-08-09 DIAGNOSIS — Z5111 Encounter for antineoplastic chemotherapy: Secondary | ICD-10-CM | POA: Diagnosis not present

## 2023-08-09 MED ORDER — SODIUM CHLORIDE 0.9% FLUSH
10.0000 mL | INTRAVENOUS | Status: DC | PRN
  Administered 2023-08-09: 10 mL
  Filled 2023-08-09: qty 10

## 2023-08-09 MED ORDER — PEGFILGRASTIM-JMDB 6 MG/0.6ML ~~LOC~~ SOSY
6.0000 mg | PREFILLED_SYRINGE | Freq: Once | SUBCUTANEOUS | Status: AC
  Administered 2023-08-09: 6 mg via SUBCUTANEOUS
  Filled 2023-08-09: qty 0.6

## 2023-08-09 MED ORDER — HEPARIN SOD (PORK) LOCK FLUSH 100 UNIT/ML IV SOLN
500.0000 [IU] | Freq: Once | INTRAVENOUS | Status: AC | PRN
  Administered 2023-08-09: 500 [IU]
  Filled 2023-08-09: qty 5

## 2023-08-12 ENCOUNTER — Ambulatory Visit: Payer: Medicare HMO

## 2023-08-12 ENCOUNTER — Encounter: Payer: Self-pay | Admitting: Gastroenterology

## 2023-08-12 ENCOUNTER — Telehealth: Payer: Self-pay

## 2023-08-12 ENCOUNTER — Ambulatory Visit (INDEPENDENT_AMBULATORY_CARE_PROVIDER_SITE_OTHER): Payer: Medicare HMO | Admitting: Gastroenterology

## 2023-08-12 VITALS — BP 94/57 | HR 78 | Temp 98.1°F | Wt 200.0 lb

## 2023-08-12 DIAGNOSIS — K602 Anal fissure, unspecified: Secondary | ICD-10-CM

## 2023-08-12 DIAGNOSIS — K625 Hemorrhage of anus and rectum: Secondary | ICD-10-CM

## 2023-08-12 NOTE — Progress Notes (Signed)
Primary Care Physician: Marina Goodell, MD  Primary Gastroenterologist:  Dr. [name]  Chief Complaint  Patient presents with   Hospitalization Follow-up    Has been having loose stools up to 6 times daily, but not sure if it is related to chemo   Follow-up    Still having discomfort when having BM, no blood     HPI: Chris George is a 67 y.o. male here for follow-up after being in the hospital rectal bleeding.  The patient was found to have an anal fissure.  The patient has been on a compound made for him with nifedipine for his anal fissure.  The patient states the bleeding has improved greatly.  He has other issues related to frequent urination that he states is a side effect of his chemotherapy.  There is no report of any rectal pain or change in bowel habits.  Past Medical History:  Diagnosis Date   A-fib (HCC)    Anemia    Arthritis    ankles   Cancer (HCC)    multiple myeloma   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HCC) 10/26/2019   Cardiomyopathy (HCC)    Chemotherapy-induced peripheral neuropathy (HCC)    CHF (congestive heart failure) (HCC)    Depression    Dysrhythmia    atrial fib   History of CVA (cerebrovascular accident)    HLD (hyperlipidemia)    Hypercholesteremia    Hypertension    Moderate tricuspid insufficiency    Multiple myeloma (HCC)    not being treated right now per pt 11/11/19   Pneumonia    Sleep apnea    No CPAP.  OSA resolved with wt loss.    Current Outpatient Medications  Medication Sig Dispense Refill   allopurinol (ZYLOPRIM) 100 MG tablet Take 200 mg by mouth daily.     apixaban (ELIQUIS) 5 MG TABS tablet Take 5 mg by mouth 2 (two) times daily.     APPLE CIDER VINEGAR PO Take 30-45 mLs by mouth every other day.      atorvastatin (LIPITOR) 80 MG tablet Take 80 mg by mouth daily.     colchicine 0.6 MG tablet Take 1 tablet (0.6 mg total) by mouth 2 (two) times daily. Until flare resolves. 20 tablet 1   gabapentin (NEURONTIN) 100 MG  capsule Take 300 mg by mouth 2 (two) times daily.     HYDROcodone-acetaminophen (NORCO) 5-325 MG tablet Take 1 tablet by mouth every 6 (six) hours as needed for moderate pain. 30 tablet 0   hydrocortisone (ANUSOL-HC) 2.5 % rectal cream Place 1 Application rectally 2 (two) times daily. 30 g 0   hydrocortisone cream 1 % Apply 1 Application topically 2 (two) times daily.     Lidocaine, Anorectal, 5 % GEL Apply 1 Application topically as needed. 30 g 1   lidocaine-prilocaine (EMLA) cream Apply 1 Application topically daily.     loperamide (IMODIUM A-D) 2 MG tablet Take 2 mg by mouth 4 (four) times daily as needed for diarrhea or loose stools.     MAGNESIUM PO Take by mouth.     Misc Natural Products (TART CHERRY ADVANCED PO) Take 60 mLs by mouth every other day.     Multiple Vitamins-Minerals (ZINC PO) Take by mouth.     potassium bicarbonate (K-LYTE) 25 MEQ disintegrating tablet Take 25 mEq by mouth 2 (two) times daily. In the afternoon.     prochlorperazine (COMPAZINE) 10 MG tablet Take 1 tablet (10 mg total) by mouth every  6 (six) hours as needed for nausea or vomiting. 60 tablet 1   psyllium (METAMUCIL SMOOTH TEXTURE) 58.6 % powder Take 1 packet by mouth daily for 20 days. 20 packet 0   tamsulosin (FLOMAX) 0.4 MG CAPS capsule Take 1 capsule (0.4 mg total) by mouth daily after supper. 30 capsule 6   traZODone (DESYREL) 50 MG tablet Take 1 tablet (50 mg total) by mouth at bedtime. Use caution if used with the norco pain medication 30 tablet 2   Zinc Sulfate (ZINC 15 PO) Take by mouth.     furosemide (LASIX) 80 MG tablet Take 40 mg by mouth daily as needed for edema. (Patient not taking: Reported on 08/12/2023)     nifedipine 0.3 % ointment Place 1 Application rectally 4 (four) times daily. with Lidocaine 1.5% (Patient not taking: Reported on 08/12/2023) 30 g 0   No current facility-administered medications for this visit.   Facility-Administered Medications Ordered in Other Visits  Medication Dose  Route Frequency Provider Last Rate Last Admin   heparin lock flush 100 unit/mL  500 Units Intravenous Once [name], MD        Allergies as of 08/12/2023   (No Known Allergies)    ROS:  General: Negative for anorexia, weight loss, fever, chills, fatigue, weakness. ENT: Negative for hoarseness, difficulty swallowing , nasal congestion. CV: Negative for chest pain, angina, palpitations, dyspnea on exertion, peripheral edema.  Respiratory: Negative for dyspnea at rest, dyspnea on exertion, cough, sputum, wheezing.  GI: See history of present illness. GU:  Negative for dysuria, hematuria, urinary incontinence, urinary frequency, nocturnal urination.  Endo: Negative for unusual weight change.    Physical Examination:   BP (!) 94/57 (BP Location: Left Arm, Patient Position: Sitting, Cuff Size: Normal)   Pulse 78   Temp 98.1 F (36.7 C) (Oral)   Wt 200 lb (90.7 kg)   BMI 29.53 kg/m   General: Well-nourished, well-developed in no acute distress.  Eyes: No icterus. Conjunctivae pink. Rectal: Anal fissure still present at 12:00 Neuro: Alert and oriented x 3.  Grossly intact. Skin: Warm and dry, no jaundice.   Psych: Alert and cooperative, normal mood and affect.  Labs:    Imaging Studies: No results found.  Assessment and Plan:   Chris George is a 67 y.o. y/o male who has a history of an anal fissure that has been getting better with less bleeding.  The patient will have his compound suppositories refills.  The patient will contact me if he does not resolve his issues and a referral to surgery.  The patient has been explained the plan and agrees with it.     [name], MD. Clementeen Graham    Note: This dictation was prepared with Dragon dictation along with smaller phrase technology. Any transcriptional errors that result from this process are unintentional.

## 2023-08-12 NOTE — Telephone Encounter (Signed)
Pt in office states he's not feeling well. Pt states he has been sick since Friday after finishing chemo pump. Unable to eat.

## 2023-08-13 ENCOUNTER — Ambulatory Visit
Admission: RE | Admit: 2023-08-13 | Discharge: 2023-08-13 | Disposition: A | Discharge status: Home / Self Care | Payer: Medicare HMO | Source: Ambulatory Visit | Attending: Oncology | Admitting: Oncology

## 2023-08-13 ENCOUNTER — Encounter: Payer: Self-pay | Admitting: Hospice and Palliative Medicine

## 2023-08-13 ENCOUNTER — Inpatient Hospital Stay (HOSPITAL_BASED_OUTPATIENT_CLINIC_OR_DEPARTMENT_OTHER): Payer: Medicare HMO | Admitting: Hospice and Palliative Medicine

## 2023-08-13 ENCOUNTER — Other Ambulatory Visit: Payer: Self-pay

## 2023-08-13 ENCOUNTER — Ambulatory Visit: Payer: Medicare HMO

## 2023-08-13 ENCOUNTER — Inpatient Hospital Stay: Payer: Medicare HMO

## 2023-08-13 ENCOUNTER — Telehealth: Payer: Self-pay | Admitting: Gastroenterology

## 2023-08-13 VITALS — BP 93/67 | HR 87 | Temp 97.6°F | Resp 20 | Wt 200.4 lb

## 2023-08-13 DIAGNOSIS — C184 Malignant neoplasm of transverse colon: Secondary | ICD-10-CM | POA: Diagnosis not present

## 2023-08-13 DIAGNOSIS — C61 Malignant neoplasm of prostate: Secondary | ICD-10-CM

## 2023-08-13 DIAGNOSIS — Z5111 Encounter for antineoplastic chemotherapy: Secondary | ICD-10-CM | POA: Diagnosis not present

## 2023-08-13 LAB — CMP (CANCER CENTER ONLY)
ALT: 29 U/L (ref 0–44)
AST: 35 U/L (ref 15–41)
Albumin: 3 g/dL — ABNORMAL LOW (ref 3.5–5.0)
Alkaline Phosphatase: 222 U/L — ABNORMAL HIGH (ref 38–126)
Anion gap: 5 (ref 5–15)
BUN: 22 mg/dL (ref 8–23)
CO2: 20 mmol/L — ABNORMAL LOW (ref 22–32)
Calcium: 8.2 mg/dL — ABNORMAL LOW (ref 8.9–10.3)
Chloride: 106 mmol/L (ref 98–111)
Creatinine: 1.73 mg/dL — ABNORMAL HIGH (ref 0.61–1.24)
GFR, Estimated: 43 mL/min — ABNORMAL LOW (ref >60)
Glucose, Bld: 161 mg/dL — ABNORMAL HIGH (ref 70–99)
Potassium: 4.8 mmol/L (ref 3.5–5.1)
Sodium: 131 mmol/L — ABNORMAL LOW (ref 135–145)
Total Bilirubin: 0.9 mg/dL (ref 0.3–1.2)
Total Protein: 6.4 g/dL — ABNORMAL LOW (ref 6.5–8.1)

## 2023-08-13 LAB — CBC WITH DIFFERENTIAL (CANCER CENTER ONLY)
Abs Immature Granulocytes: 0.09 10*3/uL — ABNORMAL HIGH (ref 0.00–0.07)
Basophils Absolute: 0 10*3/uL (ref 0.0–0.1)
Basophils Relative: 0 %
Eosinophils Absolute: 0 10*3/uL (ref 0.0–0.5)
Eosinophils Relative: 0 %
HCT: 26.1 % — ABNORMAL LOW (ref 39.0–52.0)
Hemoglobin: 8.2 g/dL — ABNORMAL LOW (ref 13.0–17.0)
Immature Granulocytes: 1 %
Lymphocytes Relative: 4 %
Lymphs Abs: 0.4 10*3/uL — ABNORMAL LOW (ref 0.7–4.0)
MCH: 29.5 pg (ref 26.0–34.0)
MCHC: 31.4 g/dL (ref 30.0–36.0)
MCV: 93.9 fL (ref 80.0–100.0)
Monocytes Absolute: 0.3 10*3/uL (ref 0.1–1.0)
Monocytes Relative: 3 %
Neutro Abs: 10 10*3/uL — ABNORMAL HIGH (ref 1.7–7.7)
Neutrophils Relative %: 92 %
Platelet Count: 103 10*3/uL — ABNORMAL LOW (ref 150–400)
RBC: 2.78 MIL/uL — ABNORMAL LOW (ref 4.22–5.81)
RDW: 19.1 % — ABNORMAL HIGH (ref 11.5–15.5)
Smear Review: NORMAL
WBC Count: 10.8 10*3/uL — ABNORMAL HIGH (ref 4.0–10.5)
nRBC: 0 % (ref 0.0–0.2)

## 2023-08-13 LAB — MAGNESIUM: Magnesium: 2 mg/dL (ref 1.7–2.4)

## 2023-08-13 LAB — IRON AND TIBC
Iron: 79 ug/dL (ref 45–182)
Saturation Ratios: 34 % (ref 17.9–39.5)
TIBC: 234 ug/dL — ABNORMAL LOW (ref 250–450)
UIBC: 155 ug/dL

## 2023-08-13 MED ORDER — MIRTAZAPINE 7.5 MG PO TABS: 7.5000 mg | ORAL_TABLET | Freq: Every day | ORAL | 3 refills | Status: DC

## 2023-08-13 MED ORDER — NIFEDIPINE 0.3 % OINTMENT: 1.0000 | TOPICAL_OINTMENT | Freq: Four times a day (QID) | CUTANEOUS | 0 refills | Status: DC

## 2023-08-13 NOTE — Telephone Encounter (Signed)
Pt is aware that [facility] has Rx ready for pick up

## 2023-08-13 NOTE — Addendum Note (Signed)
Addended by: Roena Malady on: 08/13/2023 10:13 AM   Modules accepted: Orders

## 2023-08-13 NOTE — Telephone Encounter (Signed)
Patient called in to get his medication refilled (Nifedipine) 0.3%. The patient stated that he went to the pharmacy and they said nothing was sent in. The patient is wanting a call back to confirm it has been sent. The patient wants it to go to [facility] pharmacy in [place] because [facility] on [place] don't have it.

## 2023-08-13 NOTE — Progress Notes (Signed)
[facility]pt[facility] at [facility] [phone] [phone]  Patient Care Team: Marina Goodell, MD as PCP - General (Family Medicine) [name], MD as Consulting Physician (Oncology) Morene Crocker, MD as Referring Physician (Neurology) Lamar Blinks, MD as Consulting Physician (Cardiology)   NAME OF PATIENT: Chris George  [id number]  10/14/1956   DATE OF VISIT: 08/13/23  REASON FOR CONSULT: JOEMAR KELLAR is a 67 y.o. male with multiple medical problems including recurrent stage IIIc colon cancer, smoldering myeloma, and prostate cancer.  History of stroke with residual expressive aphasia.  INTERVAL HISTORY: Patient saw Dr. Orlie Dakin on 08/07/2023 for cycle 6 FOLFIRI.  He continued to have chronic weakness and fatigue but symptoms improved some after receiving blood transfusion.    Patient was referred to [facility] for evaluation fatigue.  Patient reports that he has felt markedly worse fatigue since he received treatment last week.  He reports poor oral intake with minimal food but says that he is trying to drink increased fluids.  He says that he had diarrhea following his chemotherapy but this resolved and he is now having constipation.  Taking MiraLAX for that.  Saw GI yesterday for anal fissure.   Denies recent fevers or illnesses. Denies any easy bleeding or bruising. Reports weight loss. Denies chest pain. Denies any nausea, vomiting. Denies urinary complaints. Patient offers no further specific complaints today.   PAST MEDICAL HISTORY: Past Medical History:  Diagnosis Date   A-fib (HCC)    Anemia    Arthritis    ankles   Cancer (HCC)    multiple myeloma   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HCC) 10/26/2019   Cardiomyopathy (HCC)    Chemotherapy-induced peripheral neuropathy (HCC)    CHF (congestive heart failure) (HCC)    Depression    Dysrhythmia    atrial fib   History of CVA (cerebrovascular  accident)    HLD (hyperlipidemia)    Hypercholesteremia    Hypertension    Moderate tricuspid insufficiency    Multiple myeloma (HCC)    not being treated right now per pt 11/11/19   Pneumonia    Sleep apnea    No CPAP.  OSA resolved with wt loss.    PAST SURGICAL HISTORY:  Past Surgical History:  Procedure Laterality Date   ATRIAL ABLATION SURGERY     CARDIAC SURGERY     A-Fib Ablations   COLONOSCOPY WITH PROPOFOL N/A 10/26/2019   Procedure: COLONOSCOPY WITH BIOPSY;  Surgeon: [name], MD;  Location: [facility];  Service: Endoscopy;  Laterality: N/A;   COLONOSCOPY WITH PROPOFOL N/A 09/03/2022   Procedure: COLONOSCOPY WITH PROPOFOL;  Surgeon: [name], MD;  Location: [facility];  Service: Endoscopy;  Laterality: N/A;   ESOPHAGOGASTRODUODENOSCOPY (EGD) WITH PROPOFOL N/A 10/26/2019   Procedure: ESOPHAGOGASTRODUODENOSCOPY (EGD) WITH BIOPSY;  Surgeon: [name], MD;  Location: [facility];  Service: Endoscopy;  Laterality: N/A;  sleep apnea   LAPAROSCOPIC PARTIAL COLECTOMY Right 11/17/2019   Procedure: LAPAROSCOPIC PARTIAL COLECTOMY RIGHT EXTENDED;  Surgeon: [name], MD;  Location: [facility];  Service: General;  Laterality: Right;   PORTACATH PLACEMENT N/A 12/28/2019   Procedure: INSERTION PORT-A-CATH Left subclavian;  Surgeon: [name], MD;  Location: [facility];  Service: General;  Laterality: N/A;    HEMATOLOGY/ONCOLOGY HISTORY:  Oncology History  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HCC)  10/27/2019 Initial Diagnosis   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HCC)   12/01/2019 Cancer Staging   Staging form: Colon  and Rectum, AJCC 8th Edition - Clinical stage from 12/01/2019: Stage IIIC (cT3, cN2b, cM0) - Signed by [name], MD on 12/01/2019   12/30/2019 - 06/03/2020 Chemotherapy   Patient is on Treatment Plan : COLORECTAL FOLFOX q14d x 6 months     05/15/2023 -  Chemotherapy   Patient is on Treatment Plan : COLORECTAL FOLFIRI q14d        ALLERGIES:  has No Known Allergies.  MEDICATIONS:  Current Outpatient Medications  Medication Sig Dispense Refill   allopurinol (ZYLOPRIM) 100 MG tablet Take 200 mg by mouth daily.     apixaban (ELIQUIS) 5 MG TABS tablet Take 5 mg by mouth 2 (two) times daily.     APPLE CIDER VINEGAR PO Take 30-45 mLs by mouth every other day.      atorvastatin (LIPITOR) 80 MG tablet Take 80 mg by mouth daily.     colchicine 0.6 MG tablet Take 1 tablet (0.6 mg total) by mouth 2 (two) times daily. Until flare resolves. 20 tablet 1   furosemide (LASIX) 80 MG tablet Take 40 mg by mouth daily as needed for edema. (Patient not taking: Reported on 08/12/2023)     gabapentin (NEURONTIN) 100 MG capsule Take 300 mg by mouth 2 (two) times daily.     HYDROcodone-acetaminophen (NORCO) 5-325 MG tablet Take 1 tablet by mouth every 6 (six) hours as needed for moderate pain. 30 tablet 0   hydrocortisone (ANUSOL-HC) 2.5 % rectal cream Place 1 Application rectally 2 (two) times daily. 30 g 0   hydrocortisone cream 1 % Apply 1 Application topically 2 (two) times daily.     Lidocaine, Anorectal, 5 % GEL Apply 1 Application topically as needed. 30 g 1   lidocaine-prilocaine (EMLA) cream Apply 1 Application topically daily.     loperamide (IMODIUM A-D) 2 MG tablet Take 2 mg by mouth 4 (four) times daily as needed for diarrhea or loose stools.     MAGNESIUM PO Take by mouth.     Misc Natural Products (TART CHERRY ADVANCED PO) Take 60 mLs by mouth every other day.     Multiple Vitamins-Minerals (ZINC PO) Take by mouth.     nifedipine 0.3 % ointment Place 1 Application rectally 4 (four) times daily. with Lidocaine 1.5% 30 g 0   potassium bicarbonate (K-LYTE) 25 MEQ disintegrating tablet Take 25 mEq by mouth 2 (two) times daily. In the afternoon.     prochlorperazine (COMPAZINE) 10 MG tablet Take 1 tablet (10 mg total) by mouth every 6 (six) hours as needed for nausea or vomiting. 60 tablet 1   tamsulosin (FLOMAX) 0.4 MG CAPS  capsule Take 1 capsule (0.4 mg total) by mouth daily after supper. 30 capsule 6   traZODone (DESYREL) 50 MG tablet Take 1 tablet (50 mg total) by mouth at bedtime. Use caution if used with the norco pain medication 30 tablet 2   Zinc Sulfate (ZINC 15 PO) Take by mouth.     No current facility-administered medications for this visit.   Facility-Administered Medications Ordered in Other Visits  Medication Dose Route Frequency Provider Last Rate Last Admin   heparin lock flush 100 unit/mL  500 Units Intravenous Once [name], MD        VITAL SIGNS: There were no vitals taken for this visit. There were no vitals filed for this visit.  Estimated body mass index is 29.53 kg/m as calculated from the following:   Height as of 08/07/23: 5\' 9"  (1.753 m).  Weight as of 08/12/23: 200 lb (90.7 kg).  LABS: CBC:    Component Value Date/Time   WBC 5.9 08/07/2023 0802   WBC 4.7 07/23/2023 1431   HGB 8.0 (L) 08/07/2023 0802   HGB 14.3 05/13/2013 1116   HCT 25.1 (L) 08/07/2023 0802   HCT 42.9 05/13/2013 1116   PLT 132 (L) 08/07/2023 0802   PLT 169 05/13/2013 1116   MCV 92.6 08/07/2023 0802   MCV 84 05/13/2013 1116   NEUTROABS 4.4 08/07/2023 0802   NEUTROABS 2.3 05/13/2013 1116   LYMPHSABS 0.7 08/07/2023 0802   LYMPHSABS 0.9 (L) 05/13/2013 1116   MONOABS 0.6 08/07/2023 0802   MONOABS 0.4 05/13/2013 1116   EOSABS 0.1 08/07/2023 0802   EOSABS 0.1 05/13/2013 1116   BASOSABS 0.0 08/07/2023 0802   BASOSABS 0.0 05/13/2013 1116   Comprehensive Metabolic Panel:    Component Value Date/Time   NA 135 08/07/2023 0802   NA 143 05/13/2013 1116   K 4.4 08/07/2023 0802   K 3.5 05/13/2013 1116   CL 108 08/07/2023 0802   CL 102 05/13/2013 1116   CO2 21 (L) 08/07/2023 0802   CO2 32 05/13/2013 1116   BUN 17 08/07/2023 0802   BUN 14 05/13/2013 1116   CREATININE 1.96 (H) 08/07/2023 0802   CREATININE 1.53 (H) 05/13/2013 1116   GLUCOSE 125 (H) 08/07/2023 0802   GLUCOSE 122 (H) 05/13/2013  1116   CALCIUM 8.2 (L) 08/07/2023 0802   CALCIUM 8.6 05/13/2013 1116   AST 29 08/07/2023 0802   ALT 20 08/07/2023 0802   ALT 37 07/24/2012 1915   ALKPHOS 186 (H) 08/07/2023 0802   ALKPHOS 129 07/24/2012 1915   BILITOT 0.7 08/07/2023 0802   PROT 6.4 (L) 08/07/2023 0802   PROT 8.4 (H) 07/24/2012 1915   ALBUMIN 2.9 (L) 08/07/2023 0802   ALBUMIN 4.0 07/24/2012 1915    RADIOGRAPHIC STUDIES: No results found.  PERFORMANCE STATUS (ECOG) : 2 - Symptomatic, <50% confined to bed  Review of Systems Unless otherwise noted, a complete review of systems is negative.  Physical Exam General: NAD Cardiovascular: regular rate and rhythm Pulmonary: clear ant fields Abdomen: soft, nontender, + bowel sounds GU: no suprapubic tenderness Extremities: no edema, no joint deformities Skin: no rashes Neurological: Weakness, occasional expressive aphasia  IMPRESSION/PLAN: Colorectal cancer -on treatment FOLFIRI.  Pending PET scan today.  Prostate cancer -pending follow-up with urology  Myeloma -on surveillance  Fatigue -likely multifactorial from chemotherapy, cancer, and poor oral intake.  Recommended supportive care.  Poor oral intake -discussed importance of high-calorie/high-protein foods.  Will send referral back to nutrition.  Recommended daily oral nutritional supplements such as Ensure or boost.  Will start patient on appetite stimulant with mirtazapine 7.5 mg nightly.  Will also order TSH and repeat iron studies at time of next labs.  Constipation -patient saw GI yesterday and is noted to have an anal fissure.  He is on nifedipine cream for that.  Discussed bowel regimen today with recommendation for MiraLAX/docusate.  Follow-up next week with Dr. Orlie Dakin.  Case and plan discussed with Dr. Orlie Dakin of the day.  Patient expressed understanding and was in agreement with this plan. He also understands that He can call clinic at any time with any questions, concerns, or complaints.    Thank you for allowing me to participate in the care of this very pleasant patient.   Time Total: 20 minutes  Visit consisted of counseling and education dealing with the complex and emotionally intense issues of symptom  management in the setting of serious illness.Greater than 50%  of this time was spent counseling and coordinating care related to the above assessment and plan.  Signed by: Laurette Schimke, PhD, NP-C

## 2023-08-14 ENCOUNTER — Other Ambulatory Visit: Payer: Medicare HMO

## 2023-08-14 ENCOUNTER — Encounter: Payer: Self-pay | Admitting: Oncology

## 2023-08-14 ENCOUNTER — Ambulatory Visit: Payer: Medicare HMO

## 2023-08-14 ENCOUNTER — Encounter
Admission: RE | Admit: 2023-08-14 | Discharge: 2023-08-14 | Disposition: A | Discharge status: Home / Self Care | Payer: Medicare HMO | Source: Ambulatory Visit | Attending: Oncology | Admitting: Oncology

## 2023-08-14 ENCOUNTER — Telehealth: Payer: Self-pay | Admitting: Urology

## 2023-08-14 ENCOUNTER — Ambulatory Visit: Payer: Medicare HMO | Admitting: Urology

## 2023-08-14 ENCOUNTER — Ambulatory Visit: Payer: Medicare HMO | Admitting: Oncology

## 2023-08-14 DIAGNOSIS — C184 Malignant neoplasm of transverse colon: Secondary | ICD-10-CM | POA: Diagnosis present

## 2023-08-14 LAB — GLUCOSE, CAPILLARY: Glucose-Capillary: 92 mg/dL (ref 70–99)

## 2023-08-14 MED ORDER — FLUDEOXYGLUCOSE F - 18 (FDG) INJECTION
10.4000 | Freq: Once | INTRAVENOUS | Status: AC | PRN
  Administered 2023-08-14: 10.86 via INTRAVENOUS

## 2023-08-14 NOTE — Telephone Encounter (Signed)
Patient called regarding his appointment with Dr. Richardo Hanks. He has a rescheduled appt for 08/28/23, and he is very upset. He states that Dr. Orlie Dakin said he needs to be seen STAT, and wants our office to call Dr. Orlie Dakin.

## 2023-08-14 NOTE — Telephone Encounter (Signed)
Called pt no answer. Left detailed message informing pt that Dr. Orlie Dakin is aware his appointment was moved, we were contacted by radiology and informed that the patient would not make his appointment today due to PET scan therefore his appointment was rescheduled. Advised pt we are happy to move his appointment up if the schedule allows. Advised pt to call back.

## 2023-08-15 ENCOUNTER — Ambulatory Visit: Payer: Medicare HMO

## 2023-08-19 ENCOUNTER — Ambulatory Visit: Payer: Medicare HMO

## 2023-08-19 ENCOUNTER — Encounter: Payer: Self-pay | Admitting: Oncology

## 2023-08-20 ENCOUNTER — Telehealth: Payer: Self-pay

## 2023-08-20 ENCOUNTER — Encounter: Payer: Self-pay | Admitting: Oncology

## 2023-08-20 ENCOUNTER — Ambulatory Visit: Payer: Medicare HMO

## 2023-08-20 MED FILL — Dexamethasone Sodium Phosphate Inj 100 MG/10ML: INTRAMUSCULAR | Qty: 1 | Status: AC

## 2023-08-20 NOTE — Telephone Encounter (Signed)
Pt "no showed" to Speech Therapy appointment today, 10/15, @ 0930. Left voicemail on pt's personal cell phone to remind him of his next appointment.

## 2023-08-21 ENCOUNTER — Ambulatory Visit: Payer: Medicare HMO | Admitting: Gastroenterology

## 2023-08-21 ENCOUNTER — Encounter: Payer: Self-pay | Admitting: Oncology

## 2023-08-21 ENCOUNTER — Inpatient Hospital Stay: Payer: Medicare HMO

## 2023-08-21 ENCOUNTER — Inpatient Hospital Stay (HOSPITAL_BASED_OUTPATIENT_CLINIC_OR_DEPARTMENT_OTHER): Payer: Medicare HMO | Admitting: Oncology

## 2023-08-21 VITALS — BP 104/65 | HR 77 | Resp 16

## 2023-08-21 VITALS — BP 101/62 | HR 79 | Temp 97.5°F | Resp 16 | Ht 69.0 in | Wt 201.0 lb

## 2023-08-21 DIAGNOSIS — Z5111 Encounter for antineoplastic chemotherapy: Secondary | ICD-10-CM | POA: Diagnosis not present

## 2023-08-21 DIAGNOSIS — C184 Malignant neoplasm of transverse colon: Secondary | ICD-10-CM

## 2023-08-21 DIAGNOSIS — Z23 Encounter for immunization: Secondary | ICD-10-CM

## 2023-08-21 LAB — CBC WITH DIFFERENTIAL (CANCER CENTER ONLY)
Abs Immature Granulocytes: 0.48 10*3/uL — ABNORMAL HIGH (ref 0.00–0.07)
Basophils Absolute: 0 10*3/uL (ref 0.0–0.1)
Basophils Relative: 0 %
Eosinophils Absolute: 0.1 10*3/uL (ref 0.0–0.5)
Eosinophils Relative: 2 %
HCT: 25 % — ABNORMAL LOW (ref 39.0–52.0)
Hemoglobin: 7.8 g/dL — ABNORMAL LOW (ref 13.0–17.0)
Immature Granulocytes: 7 %
Lymphocytes Relative: 11 %
Lymphs Abs: 0.8 10*3/uL (ref 0.7–4.0)
MCH: 29.5 pg (ref 26.0–34.0)
MCHC: 31.2 g/dL (ref 30.0–36.0)
MCV: 94.7 fL (ref 80.0–100.0)
Monocytes Absolute: 0.6 10*3/uL (ref 0.1–1.0)
Monocytes Relative: 8 %
Neutro Abs: 5.1 10*3/uL (ref 1.7–7.7)
Neutrophils Relative %: 72 %
Platelet Count: 86 10*3/uL — ABNORMAL LOW (ref 150–400)
RBC: 2.64 MIL/uL — ABNORMAL LOW (ref 4.22–5.81)
RDW: 20.1 % — ABNORMAL HIGH (ref 11.5–15.5)
Smear Review: DECREASED
WBC Count: 7.1 10*3/uL (ref 4.0–10.5)
nRBC: 0.7 % — ABNORMAL HIGH (ref 0.0–0.2)

## 2023-08-21 LAB — CMP (CANCER CENTER ONLY)
ALT: 24 U/L (ref 0–44)
AST: 26 U/L (ref 15–41)
Albumin: 3 g/dL — ABNORMAL LOW (ref 3.5–5.0)
Alkaline Phosphatase: 208 U/L — ABNORMAL HIGH (ref 38–126)
Anion gap: 4 — ABNORMAL LOW (ref 5–15)
BUN: 21 mg/dL (ref 8–23)
CO2: 21 mmol/L — ABNORMAL LOW (ref 22–32)
Calcium: 8 mg/dL — ABNORMAL LOW (ref 8.9–10.3)
Chloride: 109 mmol/L (ref 98–111)
Creatinine: 1.58 mg/dL — ABNORMAL HIGH (ref 0.61–1.24)
GFR, Estimated: 48 mL/min — ABNORMAL LOW (ref >60)
Glucose, Bld: 118 mg/dL — ABNORMAL HIGH (ref 70–99)
Potassium: 4.2 mmol/L (ref 3.5–5.1)
Sodium: 134 mmol/L — ABNORMAL LOW (ref 135–145)
Total Bilirubin: 0.6 mg/dL (ref 0.3–1.2)
Total Protein: 6.6 g/dL (ref 6.5–8.1)

## 2023-08-21 LAB — MAGNESIUM: Magnesium: 1.6 mg/dL — ABNORMAL LOW (ref 1.7–2.4)

## 2023-08-21 LAB — PREPARE RBC (CROSSMATCH)

## 2023-08-21 MED ORDER — SODIUM CHLORIDE 0.9 % IV SOLN
Freq: Once | INTRAVENOUS | Status: AC
  Filled 2023-08-21: qty 250

## 2023-08-21 MED ORDER — MAGNESIUM SULFATE 4 GM/100ML IV SOLN
4.0000 g | Freq: Once | INTRAVENOUS | Status: AC
  Administered 2023-08-21: 4 g via INTRAVENOUS
  Filled 2023-08-21: qty 100

## 2023-08-21 NOTE — Progress Notes (Signed)
[facility]  [phone] [phone]  ID: Chris George OB: 1956-02-03  MR#: [id number]  GMW#:[id number]  Patient Care Team: Marina Goodell, MD as PCP - General (Family Medicine) [name], MD as Consulting Physician (Oncology) Morene Crocker, MD as Referring Physician (Neurology) Lamar Blinks, MD as Consulting Physician (Cardiology)  CHIEF COMPLAINT: Recurrent colon cancer, smoldering myeloma, prostate cancer.  INTERVAL HISTORY: Patient returns to clinic today for further evaluation, discussion of his imaging results, and consideration of cycle 7 of FOLFIRI.  He continues to have chronic weakness and fatigue, but otherwise feels well today. He continues to have a mild expressive aphasia and a chronic peripheral neuropathy, but no other neurologic complaints. He denies any recent fevers or illnesses.  He has a good appetite and denies weight loss.  He has no chest pain, shortness of breath, cough, or hemoptysis.  He denies any nausea, vomiting, constipation, or diarrhea.  He has no melena or hematochezia.  He has no urinary complaints.  Patient offers no further specific complaints today.  REVIEW OF SYSTEMS:   Review of Systems  Constitutional:  Positive for malaise/fatigue. Negative for fever and weight loss.  Respiratory: Negative.  Negative for cough, hemoptysis and shortness of breath.   Cardiovascular: Negative.  Negative for chest pain and leg swelling.  Gastrointestinal: Negative.  Negative for blood in stool, diarrhea and melena.  Genitourinary: Negative.  Negative for hematuria.  Musculoskeletal: Negative.  Negative for back pain and joint pain.  Skin: Negative.  Negative for rash.  Neurological:  Positive for tingling, sensory change and weakness. Negative for dizziness, focal weakness and headaches.  Psychiatric/Behavioral: Negative.  Negative for depression. The patient is not nervous/anxious.     As per HPI.  Otherwise, a complete review of systems is negative.  PAST MEDICAL HISTORY: Past Medical History:  Diagnosis Date   A-fib (HCC)    Anemia    Arthritis    ankles   Cancer (HCC)    multiple myeloma   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HCC) 10/26/2019   Cardiomyopathy (HCC)    Chemotherapy-induced peripheral neuropathy (HCC)    CHF (congestive heart failure) (HCC)    Depression    Dysrhythmia    atrial fib   History of CVA (cerebrovascular accident)    HLD (hyperlipidemia)    Hypercholesteremia    Hypertension    Moderate tricuspid insufficiency    Multiple myeloma (HCC)    not being treated right now per pt 11/11/19   Pneumonia    Sleep apnea    No CPAP.  OSA resolved with wt loss.    PAST SURGICAL HISTORY: Past Surgical History:  Procedure Laterality Date   ATRIAL ABLATION SURGERY     CARDIAC SURGERY     A-Fib Ablations   COLONOSCOPY WITH PROPOFOL N/A 10/26/2019   Procedure: COLONOSCOPY WITH BIOPSY;  Surgeon: [name], MD;  Location: [facility];  Service: Endoscopy;  Laterality: N/A;   COLONOSCOPY WITH PROPOFOL N/A 09/03/2022   Procedure: COLONOSCOPY WITH PROPOFOL;  Surgeon: [name], MD;  Location: [facility];  Service: Endoscopy;  Laterality: N/A;   ESOPHAGOGASTRODUODENOSCOPY (EGD) WITH PROPOFOL N/A 10/26/2019   Procedure: ESOPHAGOGASTRODUODENOSCOPY (EGD) WITH BIOPSY;  Surgeon: [name], MD;  Location: [facility];  Service: Endoscopy;  Laterality: N/A;  sleep apnea   LAPAROSCOPIC PARTIAL COLECTOMY Right 11/17/2019   Procedure: LAPAROSCOPIC PARTIAL COLECTOMY RIGHT EXTENDED;  Surgeon: [name], MD;  Location: [facility];  Service: General;  Laterality: Right;  PORTACATH PLACEMENT N/A 12/28/2019   Procedure: INSERTION PORT-A-CATH Left subclavian;  Surgeon: [name], MD;  Location: [facility];  Service: General;  Laterality: N/A;    FAMILY HISTORY: Family History  Problem Relation Age of Onset   Healthy Mother     ADVANCED  DIRECTIVES (Y/N):  N  HEALTH MAINTENANCE: Social History   Tobacco Use   Smoking status: Never    Passive exposure: Never   Smokeless tobacco: Never  Vaping Use   Vaping status: Never Used  Substance Use Topics   Alcohol use: Not Currently   Drug use: Never     Colonoscopy:  PAP:  Bone density:  Lipid panel:  No Known Allergies  Current Outpatient Medications  Medication Sig Dispense Refill   allopurinol (ZYLOPRIM) 100 MG tablet Take 200 mg by mouth daily.     apixaban (ELIQUIS) 5 MG TABS tablet Take 5 mg by mouth 2 (two) times daily.     APPLE CIDER VINEGAR PO Take 30-45 mLs by mouth every other day.      atorvastatin (LIPITOR) 80 MG tablet Take 80 mg by mouth daily.     colchicine 0.6 MG tablet Take 1 tablet (0.6 mg total) by mouth 2 (two) times daily. Until flare resolves. 20 tablet 1   dicyclomine (BENTYL) 20 MG tablet Take by mouth.     gabapentin (NEURONTIN) 100 MG capsule Take 300 mg by mouth 2 (two) times daily.     HYDROcodone-acetaminophen (NORCO) 5-325 MG tablet Take 1 tablet by mouth every 6 (six) hours as needed for moderate pain. 30 tablet 0   hydrocortisone (ANUSOL-HC) 2.5 % rectal cream Place 1 Application rectally 2 (two) times daily. 30 g 0   hydrocortisone cream 1 % Apply 1 Application topically 2 (two) times daily.     Lidocaine, Anorectal, 5 % GEL Apply 1 Application topically as needed. 30 g 1   lidocaine-prilocaine (EMLA) cream Apply 1 Application topically daily.     loperamide (IMODIUM A-D) 2 MG tablet Take 2 mg by mouth 4 (four) times daily as needed for diarrhea or loose stools.     MAGNESIUM PO Take by mouth.     mirtazapine (REMERON) 7.5 MG tablet Take 1 tablet (7.5 mg total) by mouth at bedtime. 30 tablet 3   Misc Natural Products (TART CHERRY ADVANCED PO) Take 60 mLs by mouth every other day.     Multiple Vitamins-Minerals (ZINC PO) Take by mouth.     nifedipine 0.3 % ointment Place 1 Application rectally 4 (four) times daily. with Lidocaine  1.5% 30 g 0   potassium bicarbonate (K-LYTE) 25 MEQ disintegrating tablet Take 25 mEq by mouth 2 (two) times daily. In the afternoon.     prochlorperazine (COMPAZINE) 10 MG tablet Take 1 tablet (10 mg total) by mouth every 6 (six) hours as needed for nausea or vomiting. 60 tablet 1   tamsulosin (FLOMAX) 0.4 MG CAPS capsule Take 1 capsule (0.4 mg total) by mouth daily after supper. 30 capsule 6   Zinc Sulfate (ZINC 15 PO) Take by mouth.     furosemide (LASIX) 80 MG tablet Take 40 mg by mouth daily as needed for edema. (Patient not taking: Reported on 08/21/2023)     No current facility-administered medications for this visit.   Facility-Administered Medications Ordered in Other Visits  Medication Dose Route Frequency Provider Last Rate Last Admin   heparin lock flush 100 unit/mL  500 Units Intravenous Once [name], MD  OBJECTIVE: Vitals:   08/21/23 0854  BP: 101/62  Pulse: 79  Resp: 16  Temp: (!) 97.5 F (36.4 C)  SpO2: 100%       Body mass index is 29.68 kg/m.    ECOG FS:1 - Symptomatic but completely ambulatory  General: Well-developed, well-nourished, no acute distress. Eyes: Pink conjunctiva, anicteric sclera. HEENT: Normocephalic, moist mucous membranes. Lungs: No audible wheezing or coughing. Heart: Regular rate and rhythm. Abdomen: Soft, nontender, no obvious distention. Musculoskeletal: No edema, cyanosis, or clubbing. Neuro: Alert, answering all questions appropriately. Cranial nerves grossly intact. Skin: No rashes or petechiae noted. Psych: Normal affect.  LAB RESULTS:  Lab Results  Component Value Date   NA 134 (L) 08/21/2023   K 4.2 08/21/2023   CL 109 08/21/2023   CO2 21 (L) 08/21/2023   GLUCOSE 118 (H) 08/21/2023   BUN 21 08/21/2023   CREATININE 1.58 (H) 08/21/2023   CALCIUM 8.0 (L) 08/21/2023   PROT 6.6 08/21/2023   ALBUMIN 3.0 (L) 08/21/2023   AST 26 08/21/2023   ALT 24 08/21/2023   ALKPHOS 208 (H) 08/21/2023   BILITOT 0.6  08/21/2023   GFRNONAA 48 (L) 08/21/2023   GFRAA >60 07/29/2020    Lab Results  Component Value Date   WBC 7.1 08/21/2023   NEUTROABS 5.1 08/21/2023   HGB 7.8 (L) 08/21/2023   HCT 25.0 (L) 08/21/2023   MCV 94.7 08/21/2023   PLT 86 (L) 08/21/2023   Lab Results  Component Value Date   IRON 79 08/13/2023   TIBC 234 (L) 08/13/2023   IRONPCTSAT 34 08/13/2023   Lab Results  Component Value Date   FERRITIN 616 (H) 07/09/2023     STUDIES: NM PET Image Restag (PS) Skull Base To Thigh  Result Date: 08/15/2023 CLINICAL DATA:  Subsequent treatment strategy for colon cancer. History of prostate cancer, multiple myeloma, and lymphoma. EXAM: NUCLEAR MEDICINE PET SKULL BASE TO THIGH TECHNIQUE: 10.9 mCi F-18 FDG was injected intravenously. Full-ring PET imaging was performed from the skull base to thigh after the radiotracer. CT data was obtained and used for attenuation correction and anatomic localization. Fasting blood glucose: 92 mg/dl COMPARISON:  Pylarify PET-CT dated 03/21/2023. FDG PET-CT dated 03/12/2023. FINDINGS: Mediastinal blood pool activity: SUV max 2.6 Liver activity: SUV max NA NECK: No hypermetabolic lymph nodes in the neck. Incidental CT findings: None. CHEST: No hypermetabolic mediastinal or hilar nodes. No suspicious pulmonary nodules on the CT scan. Left chest port terminates in the lower SVC. Incidental CT findings: Cardiomegaly. Hypodense blood pool relative to myocardium, suggesting anemia. ABDOMEN/PELVIS: Status post right hemicolectomy. No abnormal hypermetabolism in the liver, spleen, pancreas, or adrenal glands. 13 mm short axis aggregate retrocaval node (series 6/image 105), max SUV 5.8, previously 14.0. Incidental CT findings: Mildly nodular hepatic contour. Spleen is top-normal in size. Mild atherosclerotic calcifications of the abdominal aorta and branch vessels. SKELETON: Diffuse hypermetabolism in the visualized axial and appendicular skeleton, favored to reflect  posttreatment changes. Incidental CT findings: Mild degenerative changes of the visualized thoracolumbar spine. IMPRESSION: Status post right hemicolectomy. Improving retroperitoneal nodal metastasis, as above. Diffuse osseous hypermetabolism, favoring post treatment changes. Electronically Signed   By: Charline Bills M.D.   On: 08/15/2023 00:03    ONCOLOGY HISTORY: Patient completed cycle 9 of adjuvant FOLFOX on June 03, 2020.  Given his difficulties with treatment and declining performance status, treatment was discontinued altogether. Colonoscopy on September 03, 2022 did not reveal any significant pathology and recommendation was to repeat in 3 years.  ASSESSMENT: Stage IIIc colon cancer, smoldering myeloma, prostate cancer.  PLAN:    Stage IIIc colon cancer:  CT scan from December 07, 2022 reviewed independently with enlarging aortocaval node now measuring 1.9 x 1.4 cm.  PET scan results from Mar 15, 2023 reviewed independently with progressive retroperitoneal lymphadenopathy presumably related to patient's colon cancer.  Patient CEA trended up and peaked at 194, but now is trending down.  His most recent result is 60.7.  Today's result is pending.  Repeat PET scan on August 15, 2023 reviewed independently and reported as above with significant improvement of patient's disease burden.  Delay cycle 7 of treatment secondary to thrombocytopenia. Treatment was previously dose reduced 10% given his renal insufficiency and baseline pancytopenia.  Will hold Avastin at this time given his recent stroke.  Patient will also receive IV magnesium.  Return to clinic on Friday for 1 unit of blood and then in 1 week for further evaluation and reconsideration of cycle 7.   Prostate cancer: Gleason's 3+4.  Patient's PSA increased and peaked at 39.34, but since completing treatment has decreased and is now 8.26.  Nuclear medicine bone scan on December 13, 2022 was reported as negative.  PSMA PET per radiation  oncology from Mar 21, 2023 did not reveal metastatic disease.  Patient completed XRT for local control disease on May 30, 2023.  Repeat imaging as above.  Continue follow-up with urology as scheduled.   Smoldering myeloma: Chronic and unchanged.  Patient was initially diagnosed and treated in [place], [place] and treated with single agent Velcade in approximately 2013.  He declined maintenance treatment or referral for bone marrow transplant.  His M spike has ranged from 1.1-2.0 since July 2020.  His most recent result was 1.6.  Over the same timeframe his IgG component has ranged from [phone].  His most recent result is 2344.  Finally, his kappa free light chains have ranged from 24.8 -75.3 with his most recent result reported at 36.2.  Nuclear med bone scan as above.  His most recent bone marrow biopsy completed on May 28, 2019 revealed only 10 to 15% plasma cells with no clonality reported.  Cytogenetics were also reported as normal.  Continue to monitor closely.  Anemia: Hemoglobin is trended down to 7.8 and he is symptomatic.  Return to clinic Friday for 1 unit packed red blood cells. Neutropenia: Resolved.  Continue Udenyca with pump removal.  Dose reduced FOLFIRI as above. Thrombocytopenia: Platelet count is dropped to 86.  Delay treatment as above. Renal insufficiency: Creatinine proved to 1.58.  Neither irinotecan or 5-FU need to be dose reduced in the setting of renal insufficiency.  Patient reports he has not seen nephrology in several years.  If his creatinine continues to remain elevated, will give a referral back. Hypomagnesia: Magnesium is 1.6 today.  Proceed with 4 g IV magnesium. Peripheral neuropathy: Chronic and unchanged.  Patient reports gabapentin did not help.  He was recently initiated on Lyrica 50 mg daily and given a referral to neurology.   Hypotension: Improved.   Coping/anxiety: Patient was previously given a referral to [facility]  Patient expressed understanding and  was in agreement with this plan. He also understands that He can call clinic at any time with any questions, concerns, or complaints.    [name], MD   08/21/2023 1:40 PM

## 2023-08-22 ENCOUNTER — Ambulatory Visit: Payer: Medicare HMO

## 2023-08-22 DIAGNOSIS — R4701 Aphasia: Secondary | ICD-10-CM

## 2023-08-22 DIAGNOSIS — R482 Apraxia: Secondary | ICD-10-CM

## 2023-08-22 LAB — CEA: CEA: 79.1 ng/mL — ABNORMAL HIGH (ref 0.0–4.7)

## 2023-08-22 NOTE — Therapy (Signed)
OUTPATIENT SPEECH LANGUAGE PATHOLOGY APHASIA TREATMENT     Patient Name: Chris George MRN: [id number] DOB:15-Jan-1956, 67 y.o., male Today's Date: 08/22/2023  PCP: Vallarie Mare REFERRING PROVIDER: Maryjane Hurter, MD   End of Session - 08/22/23 1159     Visit Number 22    Number of Visits 48    Date for SLP Re-Evaluation 10/07/23    Progress Note Due on Visit 30    SLP Start Time 1035    SLP Stop Time  1100    SLP Time Calculation (min) 25 min    Activity Tolerance Patient tolerated treatment well             Past Medical History:  Diagnosis Date   A-fib (HCC)    Anemia    Arthritis    ankles   Cancer (HCC)    multiple myeloma   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HCC) 10/26/2019   Cardiomyopathy (HCC)    Chemotherapy-induced peripheral neuropathy (HCC)    CHF (congestive heart failure) (HCC)    Depression    Dysrhythmia    atrial fib   History of CVA (cerebrovascular accident)    HLD (hyperlipidemia)    Hypercholesteremia    Hypertension    Moderate tricuspid insufficiency    Multiple myeloma (HCC)    not being treated right now per pt 11/11/19   Pneumonia    Sleep apnea    No CPAP.  OSA resolved with wt loss.   Past Surgical History:  Procedure Laterality Date   ATRIAL ABLATION SURGERY     CARDIAC SURGERY     A-Fib Ablations   COLONOSCOPY WITH PROPOFOL N/A 10/26/2019   Procedure: COLONOSCOPY WITH BIOPSY;  Surgeon: [name], MD;  Location: [facility];  Service: Endoscopy;  Laterality: N/A;   COLONOSCOPY WITH PROPOFOL N/A 09/03/2022   Procedure: COLONOSCOPY WITH PROPOFOL;  Surgeon: [name], MD;  Location: [facility];  Service: Endoscopy;  Laterality: N/A;   ESOPHAGOGASTRODUODENOSCOPY (EGD) WITH PROPOFOL N/A 10/26/2019   Procedure: ESOPHAGOGASTRODUODENOSCOPY (EGD) WITH BIOPSY;  Surgeon: [name], MD;  Location: [facility];  Service: Endoscopy;  Laterality: N/A;  sleep apnea   LAPAROSCOPIC PARTIAL COLECTOMY Right 11/17/2019    Procedure: LAPAROSCOPIC PARTIAL COLECTOMY RIGHT EXTENDED;  Surgeon: [name], MD;  Location: [facility];  Service: General;  Laterality: Right;   PORTACATH PLACEMENT N/A 12/28/2019   Procedure: INSERTION PORT-A-CATH Left subclavian;  Surgeon: [name], MD;  Location: [facility];  Service: General;  Laterality: N/A;   Patient Active Problem List   Diagnosis Date Noted   HFrEF (heart failure with reduced ejection fraction) (HCC) 06/12/2023   Drug-induced polyneuropathy (HCC) 04/22/2023   Hepatic cirrhosis (HCC) 04/22/2023   Major depressive disorder, single episode, mild (HCC) 04/22/2023   Malignant neoplasm of prostate (HCC) 04/22/2023   Secondary hyperparathyroidism of renal origin (HCC) 04/22/2023   History of CVA (cerebrovascular accident) 04/05/2023   Personal history of colon cancer    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   Diabetes mellitus type 2, controlled, without complications (HCC) 08/07/2021   Neuropathy 08/30/2020   Port-A-Cath in place    Abnormal ECG 11/04/2019   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HCC) 10/27/2019   Goals of care, counseling/discussion 05/18/2019   Multiple myeloma (HCC) 05/17/2019   Anemia 05/12/2019   Arthritis of left ankle 02/05/2018   Cardiomyopathy, idiopathic (HCC) 01/07/2018   Moderate tricuspid insufficiency 01/01/2017   OSA (obstructive sleep apnea) 01/01/2017   Chronic a-fib (HCC) 12/06/2015   Pure hypercholesterolemia 02/09/2015   Anticoagulant  long-term use 04/29/2014   Hyperlipidemia 04/29/2014   Hypertension, essential, benign 04/26/2014    ONSET DATE: 04/05/23  REFERRING DIAG: aphasia  THERAPY DIAG:  Aphasia  Apraxia of speech  Rationale for Evaluation and Treatment Rehabilitation  SUBJECTIVE:   SUBJECTIVE STATEMENT: Pt alert and cooperative. SLP called pt as pt was late to appointment. Pt was in car as he thought his appointment was at 11.   Pt accompanied by: self  PERTINENT HISTORY: Patient is a  67 y.o. 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 Pt with PMHx of ca of the transverse colon, multiple myeloma, lymphoma, DM, HTN, HLD, CKD, and recently diagnosed prostate cancer. Pt undergoing radiation tx for cancer and reports plan to possibly resume chemotherapy.   DIAGNOSTIC FINDINGS:  MRI brain 04/06/23, "MRI brain findings-Acute infarct in the left parietal lobe with small focus of infarct in the right temporal lobe (5:41, 5:38) evident on diffusion-weighted imaging and FLAIR sequences. A few nodular foci of low signal on susceptibility weighted imaging in the region of left parietal infarct (for example 17:46) may represent small areas of microhemorrhage or cortical vessel thrombus.Mild cerebral volume loss with ex vacuo dilatation of the CSF-containing spaces. There are scattered in confluent foci of signal abnormality within the periventricular and deep white matter.  These are nonspecific but commonly seen with small vessel ischemic changes. There is no midline shift. No extra-axial fluid collection. No mass. There is no abnormal enhancement."  PAIN:  Are you having pain? No  FALLS: Has patient fallen in last 6 months?  No  LIVING ENVIRONMENT: Lives with: lives alone Lives in: House/apartment  PLOF:  Level of assistance: Independent with ADLs, Independent with IADLs Employment: Retired   PATIENT GOALS    to be able to participate in complex conversation with family and friends; to understanding what he is reading; to improve QoL; to be more independent   OBJECTIVE:  TODAY'S TREATMENT:  Skilled SLP treatment provided targeting expressive communication.   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 Revisions and hesitation noted with successful repair of communication breakdowns for either anomia or apraxia of speech >95% of the time. Rare cues for  wordfinding/sequencing of sounds. Supportive counseling provided re: current speech/language deficits, communication strategies, effects of physical health on cognitive-linguistic functioning, and progress to date. Pt appreciative of SLP for listening.   After discussion with pt re: pt's multiple medical and personal issues, pt and SLP decided to reduce frequency of services to 1x week. Pt agreed stating, "I have so much going on, and I don't always feel well." Will plan to resume 2x/week pending completion of chemotx.     PATIENT EDUCATION: Education details: as above Person educated: Patient Education method: Explanation Education comprehension: verbalized understanding; needs further reinforcement  HOME EXERCISE PROGRAM:   Writing HEP given in previous sessions    GOALS:  Goals reviewed with patient? Yes  SHORT TERM GOALS: Target date: 10 sessions; revised goal - additional 10 weeks  Patient will demonstrate improved expressive language skills during communication breakdowns by using multimodal means (semantic feature analysis, circumlocution, synonym/antonym use, gestures, writing, drawing) to repair with mod cues. Baseline: Goal status: MET  2.  With Moderate A, patient will complete a semantic feature analysis with at least 3-5 relevant features for 8/10 target words with cues to improve word-finding skills.  Baseline:  Goal status: MET  3.  With min A, patient will  generate sentences with 5 or more words during structured task at 80% accuracy in order to increase ability to communicate basic wants and needs.  Baseline:  Goal status: MET  4.  Pt will participate in further assessment of functional reading and writing to help determine level of assistance needed for language based iADL tasks. Baseline:  Goal status: MET  5.  Patient will follow two step commands 70% acc in context of a functional activity.  Baseline:  Goal status: MET  **New goals added 06/03/23; target  date: 10 sessions   6. Pt will co-create x2 scripts for improved participation in medical appointments and                  social events with moderate assistance.    Baseline:    Goal status: MET                7. Pt will create simple text messages to desired communication partners with min A.    Baseline:    Goal status: MET  **New goals added 9/9; target 10 sessions   8. Pt will compose simple emails to desired communication partners with min A. .    Baseline:   Goal status: IN PROGRESS    9. Pt will utilize compensations for improved reading comprehension/retention with min A   Baseline:    Goal status: MET   10. Pt will communicate complex ideas (e.g. multiple viewpoints, pros/cons) with min A.    Baseline:   Goal status: IN PROGRESS   LONG TERM GOALS: Target date: 12 weeks; new date 10/07/23  Patient will demonstrate knowledge of appropriate activities to support functional receptive and expressive language outside of ST with assistance from family.  Baseline:  Goal status: MET  2.  Patient will report improved confidence in conversations outside of home (appointments, friends, on the phone) than prior to ST, as measured by Communication Effectiveness Survey.  Baseline:  15/32; 21/32 Goal status: REVISED; continue goal x1  3.  Patient and/or family will report use of strategies outside of ST to improve communication (use of scripts, pre-planning, semantic feature analysis, supported conversation).  Baseline:  Goal status: MET  4.  Patient will demonstrate improved ability with describing events, storytelling, or sharing medical information through personalized strategies (requesting time, using SFA, drawing, etc) as measured by Communication Effectiveness Survey.  Baseline:  Goal status: MET  **New goals initiated 9/9:  5. Pt will report use of compensations for preferred writing and reading tasks.   Baseline:   Goals status: INITIAL  6. Pt will communicate  complex ideas with modified independence.  Baseline: Goal status: INITIAL  ASSESSMENT:  CLINICAL IMPRESSION: Patient is a 67 y.o. 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 Pt making good progress toward goals despite ongoing medical problems and personal stressors. Pt remains highly motivated to achieve ST goals. 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 Pt making good progress toward goals. See details of tx session aboveI recommend continued course of skilled SLP services targeting functional receptive and expressive communication to improve pt's participation in iADLs, preferred activities, and QoL.   OBJECTIVE IMPAIRMENTS include expressive language, receptive language, and aphasia. These impairments are limiting patient from managing medications, managing appointments, managing finances, household responsibilities, ADLs/IADLs, and effectively communicating at home and in community. Factors affecting potential to achieve  goals and functional outcome are co-morbidities. Patient will benefit from skilled SLP services to address above impairments and improve overall function.  REHAB POTENTIAL: Excellent  PLAN: SLP FREQUENCY: 1x/week until completion of chemotx; then, resume 2x/week  SLP DURATION: 12 weeks  PLANNED INTERVENTIONS: Cueing hierachy, Internal/external aids, Functional tasks, Multimodal communication approach, SLP instruction and feedback, Compensatory strategies, and Patient/family education    Clyde Canterbury, M.S., CCC-SLP Speech-Language Pathologist  [facility] at [facility] [address] [place], [place], [address] Phone: [phone]   Fax:  [phone]

## 2023-08-23 ENCOUNTER — Ambulatory Visit: Payer: Medicare HMO

## 2023-08-23 ENCOUNTER — Encounter: Payer: Self-pay | Admitting: Oncology

## 2023-08-23 ENCOUNTER — Inpatient Hospital Stay: Payer: Medicare HMO

## 2023-08-23 DIAGNOSIS — C184 Malignant neoplasm of transverse colon: Secondary | ICD-10-CM

## 2023-08-23 DIAGNOSIS — Z5111 Encounter for antineoplastic chemotherapy: Secondary | ICD-10-CM | POA: Diagnosis not present

## 2023-08-23 MED ORDER — HEPARIN SOD (PORK) LOCK FLUSH 100 UNIT/ML IV SOLN
500.0000 [IU] | Freq: Every day | INTRAVENOUS | Status: DC | PRN
  Filled 2023-08-23: qty 5

## 2023-08-23 MED ORDER — ACETAMINOPHEN 325 MG PO TABS
650.0000 mg | ORAL_TABLET | Freq: Once | ORAL | Status: DC
  Filled 2023-08-23: qty 2

## 2023-08-23 MED ORDER — DIPHENHYDRAMINE HCL 50 MG/ML IJ SOLN
25.0000 mg | Freq: Once | INTRAMUSCULAR | Status: AC
  Administered 2023-08-23: 25 mg via INTRAVENOUS
  Filled 2023-08-23: qty 1

## 2023-08-23 MED ORDER — ACETAMINOPHEN 160 MG/5ML PO SOLN
650.0000 mg | Freq: Once | ORAL | Status: AC
  Administered 2023-08-23: 650 mg via ORAL
  Filled 2023-08-23: qty 20.3

## 2023-08-23 MED ORDER — SODIUM CHLORIDE 0.9% FLUSH
10.0000 mL | INTRAVENOUS | Status: DC | PRN
  Filled 2023-08-23: qty 10

## 2023-08-23 MED ORDER — SODIUM CHLORIDE 0.9 % IV SOLN
INTRAVENOUS | Status: DC
  Filled 2023-08-23: qty 250

## 2023-08-24 LAB — TYPE AND SCREEN
ABO/RH(D): O POS
Antibody Screen: NEGATIVE
Unit division: 0

## 2023-08-24 LAB — BPAM RBC
Blood Product Expiration Date: 202411122359
ISSUE DATE / TIME: 202410181220
Unit Type and Rh: 5100

## 2023-08-26 ENCOUNTER — Ambulatory Visit: Payer: Medicare HMO

## 2023-08-26 ENCOUNTER — Ambulatory Visit: Payer: Medicare HMO | Admitting: Gastroenterology

## 2023-08-26 ENCOUNTER — Encounter: Payer: Self-pay | Admitting: Oncology

## 2023-08-27 ENCOUNTER — Ambulatory Visit: Payer: Medicare HMO

## 2023-08-27 DIAGNOSIS — R4701 Aphasia: Secondary | ICD-10-CM | POA: Diagnosis not present

## 2023-08-27 DIAGNOSIS — R482 Apraxia: Secondary | ICD-10-CM

## 2023-08-27 NOTE — Therapy (Signed)
OUTPATIENT SPEECH LANGUAGE PATHOLOGY APHASIA TREATMENT     Patient Name: Chris George MRN: [id number] DOB:1956/07/10, 67 y.o., male Today's Date: 08/27/2023  PCP: Vallarie Mare REFERRING PROVIDER: Maryjane Hurter, MD   End of Session - 08/27/23 1231     Visit Number 23    Number of Visits 48    Date for SLP Re-Evaluation 10/07/23    Progress Note Due on Visit 30    SLP Start Time 1023    SLP Stop Time  1101    SLP Time Calculation (min) 38 min    Activity Tolerance Patient tolerated treatment well             Past Medical History:  Diagnosis Date   A-fib (HCC)    Anemia    Arthritis    ankles   Cancer (HCC)    multiple myeloma   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HCC) 10/26/2019   Cardiomyopathy (HCC)    Chemotherapy-induced peripheral neuropathy (HCC)    CHF (congestive heart failure) (HCC)    Depression    Dysrhythmia    atrial fib   History of CVA (cerebrovascular accident)    HLD (hyperlipidemia)    Hypercholesteremia    Hypertension    Moderate tricuspid insufficiency    Multiple myeloma (HCC)    not being treated right now per pt 11/11/19   Pneumonia    Sleep apnea    No CPAP.  OSA resolved with wt loss.   Past Surgical History:  Procedure Laterality Date   ATRIAL ABLATION SURGERY     CARDIAC SURGERY     A-Fib Ablations   COLONOSCOPY WITH PROPOFOL N/A 10/26/2019   Procedure: COLONOSCOPY WITH BIOPSY;  Surgeon: [name], MD;  Location: [facility];  Service: Endoscopy;  Laterality: N/A;   COLONOSCOPY WITH PROPOFOL N/A 09/03/2022   Procedure: COLONOSCOPY WITH PROPOFOL;  Surgeon: [name], MD;  Location: [facility];  Service: Endoscopy;  Laterality: N/A;   ESOPHAGOGASTRODUODENOSCOPY (EGD) WITH PROPOFOL N/A 10/26/2019   Procedure: ESOPHAGOGASTRODUODENOSCOPY (EGD) WITH BIOPSY;  Surgeon: [name], MD;  Location: [facility];  Service: Endoscopy;  Laterality: N/A;  sleep apnea   LAPAROSCOPIC PARTIAL COLECTOMY Right 11/17/2019    Procedure: LAPAROSCOPIC PARTIAL COLECTOMY RIGHT EXTENDED;  Surgeon: [name], MD;  Location: [facility];  Service: General;  Laterality: Right;   PORTACATH PLACEMENT N/A 12/28/2019   Procedure: INSERTION PORT-A-CATH Left subclavian;  Surgeon: [name], MD;  Location: [facility];  Service: General;  Laterality: N/A;   Patient Active Problem List   Diagnosis Date Noted   HFrEF (heart failure with reduced ejection fraction) (HCC) 06/12/2023   Drug-induced polyneuropathy (HCC) 04/22/2023   Hepatic cirrhosis (HCC) 04/22/2023   Major depressive disorder, single episode, mild (HCC) 04/22/2023   Malignant neoplasm of prostate (HCC) 04/22/2023   Secondary hyperparathyroidism of renal origin (HCC) 04/22/2023   History of CVA (cerebrovascular accident) 04/05/2023   Personal history of colon cancer    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   Diabetes mellitus type 2, controlled, without complications (HCC) 08/07/2021   Neuropathy 08/30/2020   Port-A-Cath in place    Abnormal ECG 11/04/2019   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HCC) 10/27/2019   Goals of care, counseling/discussion 05/18/2019   Multiple myeloma (HCC) 05/17/2019   Anemia 05/12/2019   Arthritis of left ankle 02/05/2018   Cardiomyopathy, idiopathic (HCC) 01/07/2018   Moderate tricuspid insufficiency 01/01/2017   OSA (obstructive sleep apnea) 01/01/2017   Chronic a-fib (HCC) 12/06/2015   Pure hypercholesterolemia 02/09/2015   Anticoagulant  long-term use 04/29/2014   Hyperlipidemia 04/29/2014   Hypertension, essential, benign 04/26/2014    ONSET DATE: 04/05/23  REFERRING DIAG: aphasia  THERAPY DIAG:  Aphasia  Apraxia of speech  Rationale for Evaluation and Treatment Rehabilitation  SUBJECTIVE:   SUBJECTIVE STATEMENT: Pt alert and cooperative. "My gout is acting up."  Pt accompanied by: self  PERTINENT HISTORY: Patient is a 67 y.o. male s/p L MCA distribution infarct and R punctate temporal lobe  infarct on 04/05/23. Pt with PMHx of ca of the transverse colon, multiple myeloma, lymphoma, DM, HTN, HLD, CKD, and recently diagnosed prostate cancer. Pt undergoing radiation tx for cancer and reports plan to possibly resume chemotherapy.   DIAGNOSTIC FINDINGS:  MRI brain 04/06/23, "MRI brain findings-Acute infarct in the left parietal lobe with small focus of infarct in the right temporal lobe (5:41, 5:38) evident on diffusion-weighted imaging and FLAIR sequences. A few nodular foci of low signal on susceptibility weighted imaging in the region of left parietal infarct (for example 17:46) may represent small areas of microhemorrhage or cortical vessel thrombus.Mild cerebral volume loss with ex vacuo dilatation of the CSF-containing spaces. There are scattered in confluent foci of signal abnormality within the periventricular and deep white matter.  These are nonspecific but commonly seen with small vessel ischemic changes. There is no midline shift. No extra-axial fluid collection. No mass. There is no abnormal enhancement."  PAIN:  Are you having pain? No  FALLS: Has patient fallen in last 6 months?  No  LIVING ENVIRONMENT: Lives with: lives alone Lives in: House/apartment  PLOF:  Level of assistance: Independent with ADLs, Independent with IADLs Employment: Retired   PATIENT GOALS    to be able to participate in complex conversation with family and friends; to understanding what he is reading; to improve QoL; to be more independent   OBJECTIVE:  TODAY'S TREATMENT:  Skilled SLP treatment provided targeting expressive communication.   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 Revisions and hesitation noted with successful repair of communication breakdowns for either anomia or apraxia of speech >95% of the time. Rare cues for wordfinding/sequencing of sounds. Supportive counseling provided re: current  speech/language deficits, communication strategies, effects of physical health and mood on cognitive-linguistic functioning, and progress to date. Pt appreciative of SLP for listening.   Pt endorsed being able to manage financial matters successfully via telephone conversations with multiple communication partners. He also said that he attended church this weekend and has been reading x2 books simultaneously. Pt noted continued frustration with writing ability. Pt given homework assignment targeting paragraph level email writing.    PATIENT EDUCATION: Education details: as above Person educated: Patient Education method: Explanation Education comprehension: verbalized understanding; needs further reinforcement  HOME EXERCISE PROGRAM:   Writing HEP     GOALS:  Goals reviewed with patient? Yes  SHORT TERM GOALS: Target date: 10 sessions; revised goal - additional 10 weeks  Patient will demonstrate improved expressive language skills during communication breakdowns by using multimodal means (semantic feature analysis, circumlocution, synonym/antonym use, gestures, writing, drawing) to repair with mod cues. Baseline: Goal status: MET  2.  With Moderate A, patient will complete a semantic feature analysis with at least 3-5 relevant features for 8/10 target words with cues to improve word-finding skills.  Baseline:  Goal status: MET  3.  With min A, patient will generate sentences with 5 or more words during structured task at 80% accuracy in order to increase ability  to communicate basic wants and needs.  Baseline:  Goal status: MET  4.  Pt will participate in further assessment of functional reading and writing to help determine level of assistance needed for language based iADL tasks. Baseline:  Goal status: MET  5.  Patient will follow two step commands 70% acc in context of a functional activity.  Baseline:  Goal status: MET  **New goals added 06/03/23; target date: 10  sessions   6. Pt will co-create x2 scripts for improved participation in medical appointments and                  social events with moderate assistance.    Baseline:    Goal status: MET                7. Pt will create simple text messages to desired communication partners with min A.    Baseline:    Goal status: MET  **New goals added 9/9; target 10 sessions   8. Pt will compose simple emails to desired communication partners with min A. .    Baseline:   Goal status: IN PROGRESS    9. Pt will utilize compensations for improved reading comprehension/retention with min A   Baseline:    Goal status: MET   10. Pt will communicate complex ideas (e.g. multiple viewpoints, pros/cons) with min A.    Baseline:   Goal status: IN PROGRESS   LONG TERM GOALS: Target date: 12 weeks; new date 10/07/23  Patient will demonstrate knowledge of appropriate activities to support functional receptive and expressive language outside of ST with assistance from family.  Baseline:  Goal status: MET  2.  Patient will report improved confidence in conversations outside of home (appointments, friends, on the phone) than prior to ST, as measured by Communication Effectiveness Survey.  Baseline:  15/32; 21/32 Goal status: REVISED; continue goal x1  3.  Patient and/or family will report use of strategies outside of ST to improve communication (use of scripts, pre-planning, semantic feature analysis, supported conversation).  Baseline:  Goal status: MET  4.  Patient will demonstrate improved ability with describing events, storytelling, or sharing medical information through personalized strategies (requesting time, using SFA, drawing, etc) as measured by Communication Effectiveness Survey.  Baseline:  Goal status: MET  **New goals initiated 9/9:  5. Pt will report use of compensations for preferred writing and reading tasks.   Baseline:   Goals status: INITIAL  6. Pt will communicate complex  ideas with modified independence.  Baseline: Goal status: INITIAL  ASSESSMENT:  CLINICAL IMPRESSION: Patient is a 67 y.o. 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 Pt making good progress toward goals despite ongoing medical problems and personal stressors. Pt remains highly motivated to achieve ST goals. 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 Pt making good progress toward goals. See details of tx session aboveI recommend continued course of skilled SLP services targeting functional receptive and expressive communication to improve pt's participation in iADLs, preferred activities, and QoL.   OBJECTIVE IMPAIRMENTS include expressive language, receptive language, and aphasia. These impairments are limiting patient from managing medications, managing appointments, managing finances, household responsibilities, ADLs/IADLs, and effectively communicating at home and in community. Factors affecting potential to achieve goals and functional outcome are co-morbidities. Patient will benefit from skilled SLP services to address above impairments and  improve overall function.  REHAB POTENTIAL: Excellent  PLAN: SLP FREQUENCY: 1x/week until completion of chemotx; then, resume 2x/week  SLP DURATION: 12 weeks  PLANNED INTERVENTIONS: Cueing hierachy, Internal/external aids, Functional tasks, Multimodal communication approach, SLP instruction and feedback, Compensatory strategies, and Patient/family education    Clyde Canterbury, M.S., CCC-SLP Speech-Language Pathologist  [name] Outpatient Rehabilitation at [facility] [address] [place], [place], [address] Phone: [phone]   Fax:  [phone]

## 2023-08-28 ENCOUNTER — Other Ambulatory Visit: Payer: Medicare HMO

## 2023-08-28 ENCOUNTER — Encounter: Payer: Self-pay | Admitting: Urology

## 2023-08-28 ENCOUNTER — Other Ambulatory Visit: Payer: Self-pay

## 2023-08-28 ENCOUNTER — Other Ambulatory Visit
Admission: RE | Admit: 2023-08-28 | Discharge: 2023-08-28 | Disposition: A | Discharge status: Home / Self Care | Payer: Medicare HMO | Attending: Urology | Admitting: Urology

## 2023-08-28 ENCOUNTER — Inpatient Hospital Stay (HOSPITAL_BASED_OUTPATIENT_CLINIC_OR_DEPARTMENT_OTHER): Payer: Medicare HMO | Admitting: Oncology

## 2023-08-28 ENCOUNTER — Ambulatory Visit: Payer: Medicare HMO

## 2023-08-28 ENCOUNTER — Ambulatory Visit: Payer: Medicare HMO | Admitting: Urology

## 2023-08-28 ENCOUNTER — Ambulatory Visit: Payer: Medicare HMO | Admitting: Oncology

## 2023-08-28 ENCOUNTER — Encounter: Payer: Self-pay | Admitting: Oncology

## 2023-08-28 ENCOUNTER — Inpatient Hospital Stay: Payer: Medicare HMO

## 2023-08-28 VITALS — BP 109/72 | HR 73 | Ht 69.0 in | Wt 212.0 lb

## 2023-08-28 DIAGNOSIS — Z5111 Encounter for antineoplastic chemotherapy: Secondary | ICD-10-CM | POA: Diagnosis not present

## 2023-08-28 DIAGNOSIS — C61 Malignant neoplasm of prostate: Secondary | ICD-10-CM | POA: Diagnosis present

## 2023-08-28 DIAGNOSIS — C184 Malignant neoplasm of transverse colon: Secondary | ICD-10-CM

## 2023-08-28 DIAGNOSIS — R972 Elevated prostate specific antigen [PSA]: Secondary | ICD-10-CM | POA: Diagnosis present

## 2023-08-28 DIAGNOSIS — N529 Male erectile dysfunction, unspecified: Secondary | ICD-10-CM | POA: Diagnosis not present

## 2023-08-28 LAB — CMP (CANCER CENTER ONLY)
ALT: 20 U/L (ref 0–44)
AST: 27 U/L (ref 15–41)
Albumin: 3.1 g/dL — ABNORMAL LOW (ref 3.5–5.0)
Alkaline Phosphatase: 197 U/L — ABNORMAL HIGH (ref 38–126)
Anion gap: 3 — ABNORMAL LOW (ref 5–15)
BUN: 22 mg/dL (ref 8–23)
CO2: 22 mmol/L (ref 22–32)
Calcium: 8.1 mg/dL — ABNORMAL LOW (ref 8.9–10.3)
Chloride: 108 mmol/L (ref 98–111)
Creatinine: 1.49 mg/dL — ABNORMAL HIGH (ref 0.61–1.24)
GFR, Estimated: 51 mL/min — ABNORMAL LOW (ref >60)
Glucose, Bld: 115 mg/dL — ABNORMAL HIGH (ref 70–99)
Potassium: 4.4 mmol/L (ref 3.5–5.1)
Sodium: 133 mmol/L — ABNORMAL LOW (ref 135–145)
Total Bilirubin: 0.6 mg/dL (ref 0.3–1.2)
Total Protein: 6.5 g/dL (ref 6.5–8.1)

## 2023-08-28 LAB — SAMPLE TO BLOOD BANK

## 2023-08-28 LAB — URINALYSIS, COMPLETE (UACMP) WITH MICROSCOPIC
Bilirubin Urine: NEGATIVE
Glucose, UA: NEGATIVE mg/dL
Ketones, ur: NEGATIVE mg/dL
Leukocytes,Ua: NEGATIVE
Nitrite: NEGATIVE
Protein, ur: 30 mg/dL — AB
Specific Gravity, Urine: 1.025 (ref 1.005–1.030)
pH: 5 (ref 5.0–8.0)

## 2023-08-28 LAB — CBC WITH DIFFERENTIAL (CANCER CENTER ONLY)
Abs Immature Granulocytes: 0.05 10*3/uL (ref 0.00–0.07)
Basophils Absolute: 0 10*3/uL (ref 0.0–0.1)
Basophils Relative: 0 %
Eosinophils Absolute: 0.1 10*3/uL (ref 0.0–0.5)
Eosinophils Relative: 2 %
HCT: 26.1 % — ABNORMAL LOW (ref 39.0–52.0)
Hemoglobin: 8.2 g/dL — ABNORMAL LOW (ref 13.0–17.0)
Immature Granulocytes: 1 %
Lymphocytes Relative: 10 %
Lymphs Abs: 0.5 10*3/uL — ABNORMAL LOW (ref 0.7–4.0)
MCH: 29.8 pg (ref 26.0–34.0)
MCHC: 31.4 g/dL (ref 30.0–36.0)
MCV: 94.9 fL (ref 80.0–100.0)
Monocytes Absolute: 0.7 10*3/uL (ref 0.1–1.0)
Monocytes Relative: 14 %
Neutro Abs: 3.3 10*3/uL (ref 1.7–7.7)
Neutrophils Relative %: 73 %
Platelet Count: 129 10*3/uL — ABNORMAL LOW (ref 150–400)
RBC: 2.75 MIL/uL — ABNORMAL LOW (ref 4.22–5.81)
RDW: 19.7 % — ABNORMAL HIGH (ref 11.5–15.5)
WBC Count: 4.6 10*3/uL (ref 4.0–10.5)
nRBC: 0 % (ref 0.0–0.2)

## 2023-08-28 LAB — MAGNESIUM: Magnesium: 1.6 mg/dL — ABNORMAL LOW (ref 1.7–2.4)

## 2023-08-28 MED ORDER — FLUOROURACIL CHEMO INJECTION 2.5 GM/50ML
360.0000 mg/m2 | Freq: Once | INTRAVENOUS | Status: AC
  Administered 2023-08-28: 800 mg via INTRAVENOUS
  Filled 2023-08-28: qty 16

## 2023-08-28 MED ORDER — PALONOSETRON HCL INJECTION 0.25 MG/5ML
0.2500 mg | Freq: Once | INTRAVENOUS | Status: AC
  Administered 2023-08-28: 0.25 mg via INTRAVENOUS
  Filled 2023-08-28: qty 5

## 2023-08-28 MED ORDER — SODIUM CHLORIDE 0.9 % IV SOLN
Freq: Once | INTRAVENOUS | Status: AC
  Filled 2023-08-28: qty 250

## 2023-08-28 MED ORDER — SODIUM CHLORIDE 0.9 % IV SOLN
2160.0000 mg/m2 | INTRAVENOUS | Status: DC
  Administered 2023-08-28: 5000 mg via INTRAVENOUS
  Filled 2023-08-28: qty 100

## 2023-08-28 MED ORDER — SODIUM CHLORIDE 0.9 % IV SOLN
360.0000 mg/m2 | Freq: Once | INTRAVENOUS | Status: AC
  Administered 2023-08-28: 782 mg via INTRAVENOUS
  Filled 2023-08-28: qty 39.1

## 2023-08-28 MED ORDER — SODIUM CHLORIDE 0.9 % IV SOLN
160.0000 mg/m2 | Freq: Once | INTRAVENOUS | Status: AC
  Administered 2023-08-28: 340 mg via INTRAVENOUS
  Filled 2023-08-28: qty 15

## 2023-08-28 MED ORDER — ATROPINE SULFATE 1 MG/ML IV SOLN
0.5000 mg | Freq: Once | INTRAVENOUS | Status: AC | PRN
  Administered 2023-08-28: 0.5 mg via INTRAVENOUS
  Filled 2023-08-28: qty 1

## 2023-08-28 MED ORDER — TADALAFIL 10 MG PO TABS
10.0000 mg | ORAL_TABLET | Freq: Every day | ORAL | 6 refills | Status: DC | PRN
Start: 2023-08-28 — End: 2024-03-13

## 2023-08-28 MED ORDER — DEXAMETHASONE SODIUM PHOSPHATE 10 MG/ML IJ SOLN
10.0000 mg | Freq: Once | INTRAMUSCULAR | Status: AC
Start: 2023-08-28 — End: 2023-08-28
  Administered 2023-08-28: 10 mg via INTRAVENOUS
  Filled 2023-08-28: qty 1

## 2023-08-28 NOTE — Patient Instructions (Signed)
[name]  Discharge Instructions: Thank you for choosing [facility] to provide your oncology and hematology care.  If you have a lab appointment with the [name], please go directly to the [name] and check in at the registration area.  Wear comfortable clothing and clothing appropriate for easy access to any Portacath or PICC line.   We strive to give you quality time with your provider. You may need to reschedule your appointment if you arrive late (15 or more minutes).  Arriving late affects you and other patients whose appointments are after yours.  Also, if you miss three or more appointments without notifying the office, you may be dismissed from the clinic at the provider's discretion.      For prescription refill requests, have your pharmacy contact our office and allow 72 hours for refills to be completed.    Today you received the following chemotherapy and/or immunotherapy agents IRINOTECAN, LEUCOVORIN, 5 FU      To help prevent nausea and vomiting after your treatment, we encourage you to take your nausea medication as directed.  BELOW ARE SYMPTOMS THAT SHOULD BE REPORTED IMMEDIATELY: *FEVER GREATER THAN 100.4 F (38 C) OR HIGHER *CHILLS OR SWEATING *NAUSEA AND VOMITING THAT IS NOT CONTROLLED WITH YOUR NAUSEA MEDICATION *UNUSUAL SHORTNESS OF BREATH *UNUSUAL BRUISING OR BLEEDING *URINARY PROBLEMS (pain or burning when urinating, or frequent urination) *BOWEL PROBLEMS (unusual diarrhea, constipation, pain near the anus) TENDERNESS IN MOUTH AND THROAT WITH OR WITHOUT PRESENCE OF ULCERS (sore throat, sores in mouth, or a toothache) UNUSUAL RASH, SWELLING OR PAIN  UNUSUAL VAGINAL DISCHARGE OR ITCHING   Items with * indicate a potential emergency and should be followed up as soon as possible or go to the Emergency Department if any problems should occur.  Please show the CHEMOTHERAPY ALERT CARD or IMMUNOTHERAPY ALERT  CARD at check-in to the Emergency Department and triage nurse.  Should you have questions after your visit or need to cancel or reschedule your appointment, please contact [name]  [phone] and follow the prompts.  Office hours are 8:00 a.m. to 4:30 p.m. Monday - Friday. Please note that voicemails left after 4:00 p.m. may not be returned until the following business day.  We are closed weekends and major holidays. You have access to a nurse at all times for urgent questions. Please call the main number to the clinic [phone] and follow the prompts.  For any non-urgent questions, you may also contact your provider using MyChart. We now offer e-Visits for anyone 17 and older to request care online for non-urgent symptoms. For details visit [email or web address].   Also download the MyChart app! Go to the app store, search "MyChart", open the app, select [place], and log in with your MyChart username and password.  Irinotecan Injection What is this medication? IRINOTECAN (ir in oh TEE kan) treats some types of cancer. It works by slowing down the growth of cancer cells. This medicine may be used for other purposes; ask your health care provider or pharmacist if you have questions. COMMON BRAND NAME(S): Camptosar What should I tell my care team before I take this medication? They need to know if you have any of these conditions: Dehydration Diarrhea Infection, especially a viral infection, such as chickenpox, cold sores, herpes Liver disease Low blood cell levels (white cells, red cells, and platelets) Low levels of electrolytes, such as calcium, magnesium, or potassium in your blood  Recent or ongoing radiation An unusual or allergic reaction to irinotecan, other medications, foods, dyes, or preservatives If you or your partner are pregnant or trying to get pregnant Breast-feeding How should I use this medication? This medication is injected  into a vein. It is given by your care team in a hospital or clinic setting. Talk to your care team about the use of this medication in children. Special care may be needed. Overdosage: If you think you have taken too much of this medicine contact a poison control [facility] or emergency room at once. NOTE: This medicine is only for you. Do not share this medicine with others. What if I miss a dose? Keep appointments for follow-up doses. It is important not to miss your dose. Call your care team if you are unable to keep an appointment. What may interact with this medication? Do not take this medication with any of the following: Cobicistat Itraconazole This medication may also interact with the following: Certain antibiotics, such as clarithromycin, rifampin, rifabutin Certain antivirals for HIV or AIDS Certain medications for fungal infections, such as ketoconazole, posaconazole, voriconazole Certain medications for seizures, such as carbamazepine, phenobarbital, phenytoin Gemfibrozil Nefazodone St. John's wort This list may not describe all possible interactions. Give your health care provider a list of all the medicines, herbs, non-prescription drugs, or dietary supplements you use. Also tell them if you smoke, drink alcohol, or use illegal drugs. Some items may interact with your medicine. What should I watch for while using this medication? Your condition will be monitored carefully while you are receiving this medication. You may need blood work while taking this medication. This medication may make you feel generally unwell. This is not uncommon as chemotherapy can affect healthy cells as well as cancer cells. Report any side effects. Continue your course of treatment even though you feel ill unless your care team tells you to stop. This medication can cause serious side effects. To reduce the risk, your care team may give you other medications to take before receiving this one. Be sure to  follow the directions from your care team. This medication may affect your coordination, reaction time, or judgement. Do not drive or operate machinery until you know how this medication affects you. Sit up or stand slowly to reduce the risk of dizzy or fainting spells. Drinking alcohol with this medication can increase the risk of these side effects. This medication may increase your risk of getting an infection. Call your care team for advice if you get a fever, chills, sore throat, or other symptoms of a cold or flu. Do not treat yourself. Try to avoid being around people who are sick. Avoid taking medications that contain aspirin, acetaminophen, ibuprofen, naproxen, or ketoprofen unless instructed by your care team. These medications may hide a fever. This medication may increase your risk to bruise or bleed. Call your care team if you notice any unusual bleeding. Be careful brushing or flossing your teeth or using a toothpick because you may get an infection or bleed more easily. If you have any dental work done, tell your dentist you are receiving this medication. Talk to your care team if you or your partner are pregnant or think either of you might be pregnant. This medication can cause serious birth defects if taken during pregnancy and for 6 months after the last dose. You will need a negative pregnancy test before starting this medication. Contraception is recommended while taking this medication and for 6 months after the  last dose. Your care team can help you find the option that works for you. Do not father a child while taking this medication and for 3 months after the last dose. Use a condom for contraception during this time period. Do not breastfeed while taking this medication and for 7 days after the last dose. This medication may cause infertility. Talk to your care team if you are concerned about your fertility. What side effects may I notice from receiving this medication? Side  effects that you should report to your care team as soon as possible: Allergic reactions--skin rash, itching, hives, swelling of the face, lips, tongue, or throat Dry cough, shortness of breath or trouble breathing Increased saliva or tears, increased sweating, stomach cramping, diarrhea, small pupils, unusual weakness or fatigue, slow heartbeat Infection--fever, chills, cough, sore throat, wounds that don't heal, pain or trouble when passing urine, general feeling of discomfort or being unwell Kidney injury--decrease in the amount of urine, swelling of the ankles, hands, or feet Low red blood cell level--unusual weakness or fatigue, dizziness, headache, trouble breathing Severe or prolonged diarrhea Unusual bruising or bleeding Side effects that usually do not require medical attention (report to your care team if they continue or are bothersome): Constipation Diarrhea Hair loss Loss of appetite Nausea Stomach pain This list may not describe all possible side effects. Call your doctor for medical advice about side effects. You may report side effects to FDA at 1-800-FDA-1088. Where should I keep my medication? This medication is given in a hospital or clinic. It will not be stored at home. NOTE: This sheet is a summary. It may not cover all possible information. If you have questions about this medicine, talk to your doctor, pharmacist, or health care provider.  2024 Elsevier/Gold Standard (2022-03-05 00:00:00)  Leucovorin Injection What is this medication? LEUCOVORIN (loo koe VOR in) prevents side effects from certain medications, such as methotrexate. It works by increasing folate levels. This helps protect healthy cells in your body. It may also be used to treat anemia caused by low levels of folate. It can also be used with fluorouracil, a type of chemotherapy, to treat colorectal cancer. It works by increasing the effects of fluorouracil in the body. This medicine may be used for  other purposes; ask your health care provider or pharmacist if you have questions. What should I tell my care team before I take this medication? They need to know if you have any of these conditions: Anemia from low levels of vitamin B12 in the blood An unusual or allergic reaction to leucovorin, folic acid, other medications, foods, dyes, or preservatives Pregnant or trying to get pregnant Breastfeeding How should I use this medication? This medication is injected into a vein or a muscle. It is given by your care team in a hospital or clinic setting. Talk to your care team about the use of this medication in children. Special care may be needed. Overdosage: If you think you have taken too much of this medicine contact a poison control [facility] or emergency room at once. NOTE: This medicine is only for you. Do not share this medicine with others. What if I miss a dose? Keep appointments for follow-up doses. It is important not to miss your dose. Call your care team if you are unable to keep an appointment. What may interact with this medication? Capecitabine Fluorouracil Phenobarbital Phenytoin Primidone Trimethoprim;sulfamethoxazole This list may not describe all possible interactions. Give your health care provider a list of all the  medicines, herbs, non-prescription drugs, or dietary supplements you use. Also tell them if you smoke, drink alcohol, or use illegal drugs. Some items may interact with your medicine. What should I watch for while using this medication? Your condition will be monitored carefully while you are receiving this medication. This medication may increase the side effects of 5-fluorouracil. Tell your care team if you have diarrhea or mouth sores that do not get better or that get worse. What side effects may I notice from receiving this medication? Side effects that you should report to your care team as soon as possible: Allergic reactions--skin rash, itching, hives,  swelling of the face, lips, tongue, or throat This list may not describe all possible side effects. Call your doctor for medical advice about side effects. You may report side effects to FDA at 1-800-FDA-1088. Where should I keep my medication? This medication is given in a hospital or clinic. It will not be stored at home. NOTE: This sheet is a summary. It may not cover all possible information. If you have questions about this medicine, talk to your doctor, pharmacist, or health care provider.  2024 Elsevier/Gold Standard (2022-03-27 00:00:00)   Fluorouracil Injection What is this medication? FLUOROURACIL (flure oh YOOR a sil) treats some types of cancer. It works by slowing down the growth of cancer cells. This medicine may be used for other purposes; ask your health care provider or pharmacist if you have questions. COMMON BRAND NAME(S): Adrucil What should I tell my care team before I take this medication? They need to know if you have any of these conditions: Blood disorders Dihydropyrimidine dehydrogenase (DPD) deficiency Infection, such as chickenpox, cold sores, herpes Kidney disease Liver disease Poor nutrition Recent or ongoing radiation therapy An unusual or allergic reaction to fluorouracil, other medications, foods, dyes, or preservatives If you or your partner are pregnant or trying to get pregnant Breast-feeding How should I use this medication? This medication is injected into a vein. It is administered by your care team in a hospital or clinic setting. Talk to your care team about the use of this medication in children. Special care may be needed. Overdosage: If you think you have taken too much of this medicine contact a poison control [facility] or emergency room at once. NOTE: This medicine is only for you. Do not share this medicine with others. What if I miss a dose? Keep appointments for follow-up doses. It is important not to miss your dose. Call your care team if  you are unable to keep an appointment. What may interact with this medication? Do not take this medication with any of the following: Live virus vaccines This medication may also interact with the following: Medications that treat or prevent blood clots, such as warfarin, enoxaparin, dalteparin This list may not describe all possible interactions. Give your health care provider a list of all the medicines, herbs, non-prescription drugs, or dietary supplements you use. Also tell them if you smoke, drink alcohol, or use illegal drugs. Some items may interact with your medicine. What should I watch for while using this medication? Your condition will be monitored carefully while you are receiving this medication. This medication may make you feel generally unwell. This is not uncommon as chemotherapy can affect healthy cells as well as cancer cells. Report any side effects. Continue your course of treatment even though you feel ill unless your care team tells you to stop. In some cases, you may be given additional medications to help with  side effects. Follow all directions for their use. This medication may increase your risk of getting an infection. Call your care team for advice if you get a fever, chills, sore throat, or other symptoms of a cold or flu. Do not treat yourself. Try to avoid being around people who are sick. This medication may increase your risk to bruise or bleed. Call your care team if you notice any unusual bleeding. Be careful brushing or flossing your teeth or using a toothpick because you may get an infection or bleed more easily. If you have any dental work done, tell your dentist you are receiving this medication. Avoid taking medications that contain aspirin, acetaminophen, ibuprofen, naproxen, or ketoprofen unless instructed by your care team. These medications may hide a fever. Do not treat diarrhea with over the counter products. Contact your care team if you have diarrhea  that lasts more than 2 days or if it is severe and watery. This medication can make you more sensitive to the sun. Keep out of the sun. If you cannot avoid being in the sun, wear protective clothing and sunscreen. Do not use sun lamps, tanning beds, or tanning booths. Talk to your care team if you or your partner wish to become pregnant or think you might be pregnant. This medication can cause serious birth defects if taken during pregnancy and for 3 months after the last dose. A reliable form of contraception is recommended while taking this medication and for 3 months after the last dose. Talk to your care team about effective forms of contraception. Do not father a child while taking this medication and for 3 months after the last dose. Use a condom while having sex during this time period. Do not breastfeed while taking this medication. This medication may cause infertility. Talk to your care team if you are concerned about your fertility. What side effects may I notice from receiving this medication? Side effects that you should report to your care team as soon as possible: Allergic reactions--skin rash, itching, hives, swelling of the face, lips, tongue, or throat Heart attack--pain or tightness in the chest, shoulders, arms, or jaw, nausea, shortness of breath, cold or clammy skin, feeling faint or lightheaded Heart failure--shortness of breath, swelling of the ankles, feet, or hands, sudden weight gain, unusual weakness or fatigue Heart rhythm changes--fast or irregular heartbeat, dizziness, feeling faint or lightheaded, chest pain, trouble breathing High ammonia level--unusual weakness or fatigue, confusion, loss of appetite, nausea, vomiting, seizures Infection--fever, chills, cough, sore throat, wounds that don't heal, pain or trouble when passing urine, general feeling of discomfort or being unwell Low red blood cell level--unusual weakness or fatigue, dizziness, headache, trouble  breathing Pain, tingling, or numbness in the hands or feet, muscle weakness, change in vision, confusion or trouble speaking, loss of balance or coordination, trouble walking, seizures Redness, swelling, and blistering of the skin over hands and feet Severe or prolonged diarrhea Unusual bruising or bleeding Side effects that usually do not require medical attention (report to your care team if they continue or are bothersome): Dry skin Headache Increased tears Nausea Pain, redness, or swelling with sores inside the mouth or throat Sensitivity to light Vomiting This list may not describe all possible side effects. Call your doctor for medical advice about side effects. You may report side effects to FDA at 1-800-FDA-1088. Where should I keep my medication? This medication is given in a hospital or clinic. It will not be stored at home. NOTE: This sheet is  a summary. It may not cover all possible information. If you have questions about this medicine, talk to your doctor, pharmacist, or health care provider.  2024 Elsevier/Gold Standard (2022-02-27 00:00:00)

## 2023-08-28 NOTE — Progress Notes (Signed)
[facility]  [phone]) [phone] [phone]) [phone]  ID: DEQUANN SCHLEETER OB: 01-24-1956  MR#: [id number]  QIH#:[id number]  Patient Care Team: Marina Goodell, MD as PCP - General (Family Medicine) [name], MD as Consulting Physician (Oncology) Morene Crocker, MD as Referring Physician (Neurology) Lamar Blinks, MD as Consulting Physician (Cardiology)  CHIEF COMPLAINT: Recurrent colon cancer, smoldering myeloma, prostate cancer.  INTERVAL HISTORY: Patient returns to clinic today for further evaluation and reconsideration of cycle 7 of FOLFIRI.  He continues to have chronic weakness and fatigue, but states this is mildly improved after receiving a transfusion last week.  He continues to have a mild expressive aphasia and a chronic peripheral neuropathy, but no other neurologic complaints. He denies any recent fevers or illnesses.  He has a good appetite and denies weight loss.  He has no chest pain, shortness of breath, cough, or hemoptysis.  He denies any nausea, vomiting, constipation, or diarrhea.  He has no melena or hematochezia.  He has no urinary complaints.  Patient offers no further specific complaints today.  REVIEW OF SYSTEMS:   Review of Systems  Constitutional:  Positive for malaise/fatigue. Negative for fever and weight loss.  Respiratory: Negative.  Negative for cough, hemoptysis and shortness of breath.   Cardiovascular: Negative.  Negative for chest pain and leg swelling.  Gastrointestinal: Negative.  Negative for blood in stool, diarrhea and melena.  Genitourinary: Negative.  Negative for hematuria.  Musculoskeletal: Negative.  Negative for back pain and joint pain.  Skin: Negative.  Negative for rash.  Neurological:  Positive for tingling, sensory change and weakness. Negative for dizziness, focal weakness and headaches.  Psychiatric/Behavioral: Negative.  Negative for depression. The patient is not nervous/anxious.     As per  HPI. Otherwise, a complete review of systems is negative.  PAST MEDICAL HISTORY: Past Medical History:  Diagnosis Date   A-fib (HCC)    Anemia    Arthritis    ankles   Cancer (HCC)    multiple myeloma   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HCC) 10/26/2019   Cardiomyopathy (HCC)    Chemotherapy-induced peripheral neuropathy (HCC)    CHF (congestive heart failure) (HCC)    Depression    Dysrhythmia    atrial fib   History of CVA (cerebrovascular accident)    HLD (hyperlipidemia)    Hypercholesteremia    Hypertension    Moderate tricuspid insufficiency    Multiple myeloma (HCC)    not being treated right now per pt 11/11/19   Pneumonia    Sleep apnea    No CPAP.  OSA resolved with wt loss.    PAST SURGICAL HISTORY: Past Surgical History:  Procedure Laterality Date   ATRIAL ABLATION SURGERY     CARDIAC SURGERY     A-Fib Ablations   COLONOSCOPY WITH PROPOFOL N/A 10/26/2019   Procedure: COLONOSCOPY WITH BIOPSY;  Surgeon: [name], MD;  Location: [facility];  Service: Endoscopy;  Laterality: N/A;   COLONOSCOPY WITH PROPOFOL N/A 09/03/2022   Procedure: COLONOSCOPY WITH PROPOFOL;  Surgeon: [name], MD;  Location: [facility];  Service: Endoscopy;  Laterality: N/A;   ESOPHAGOGASTRODUODENOSCOPY (EGD) WITH PROPOFOL N/A 10/26/2019   Procedure: ESOPHAGOGASTRODUODENOSCOPY (EGD) WITH BIOPSY;  Surgeon: [name], MD;  Location: [facility];  Service: Endoscopy;  Laterality: N/A;  sleep apnea   LAPAROSCOPIC PARTIAL COLECTOMY Right 11/17/2019   Procedure: LAPAROSCOPIC PARTIAL COLECTOMY RIGHT EXTENDED;  Surgeon: [name], MD;  Location: [facility];  Service: General;  Laterality: Right;   PORTACATH PLACEMENT N/A 12/28/2019   Procedure: INSERTION PORT-A-CATH Left subclavian;  Surgeon: [name], MD;  Location: [facility];  Service: General;  Laterality: N/A;    FAMILY HISTORY: Family History  Problem Relation Age of Onset   Healthy Mother     ADVANCED  DIRECTIVES (Y/N):  N  HEALTH MAINTENANCE: Social History   Tobacco Use   Smoking status: Never    Passive exposure: Never   Smokeless tobacco: Never  Vaping Use   Vaping status: Never Used  Substance Use Topics   Alcohol use: Not Currently   Drug use: Never     Colonoscopy:  PAP:  Bone density:  Lipid panel:  No Known Allergies  Current Outpatient Medications  Medication Sig Dispense Refill   allopurinol (ZYLOPRIM) 100 MG tablet Take 200 mg by mouth daily.     apixaban (ELIQUIS) 5 MG TABS tablet Take 5 mg by mouth 2 (two) times daily.     APPLE CIDER VINEGAR PO Take 30-45 mLs by mouth every other day.      atorvastatin (LIPITOR) 80 MG tablet Take 80 mg by mouth daily.     colchicine 0.6 MG tablet Take 1 tablet (0.6 mg total) by mouth 2 (two) times daily. Until flare resolves. 20 tablet 1   dicyclomine (BENTYL) 20 MG tablet Take by mouth.     furosemide (LASIX) 80 MG tablet Take 40 mg by mouth daily as needed for edema.     gabapentin (NEURONTIN) 100 MG capsule Take 300 mg by mouth 2 (two) times daily.     HYDROcodone-acetaminophen (NORCO) 5-325 MG tablet Take 1 tablet by mouth every 6 (six) hours as needed for moderate pain. 30 tablet 0   hydrocortisone (ANUSOL-HC) 2.5 % rectal cream Place 1 Application rectally 2 (two) times daily. 30 g 0   hydrocortisone cream 1 % Apply 1 Application topically 2 (two) times daily.     Lidocaine, Anorectal, 5 % GEL Apply 1 Application topically as needed. 30 g 1   lidocaine-prilocaine (EMLA) cream Apply 1 Application topically daily.     loperamide (IMODIUM A-D) 2 MG tablet Take 2 mg by mouth 4 (four) times daily as needed for diarrhea or loose stools.     MAGNESIUM PO Take by mouth.     mirtazapine (REMERON) 7.5 MG tablet Take 1 tablet (7.5 mg total) by mouth at bedtime. 30 tablet 3   Misc Natural Products (TART CHERRY ADVANCED PO) Take 60 mLs by mouth every other day.     Multiple Vitamins-Minerals (ZINC PO) Take by mouth.     nifedipine  0.3 % ointment Place 1 Application rectally 4 (four) times daily. with Lidocaine 1.5% 30 g 0   potassium bicarbonate (K-LYTE) 25 MEQ disintegrating tablet Take 25 mEq by mouth 2 (two) times daily. In the afternoon.     prochlorperazine (COMPAZINE) 10 MG tablet Take 1 tablet (10 mg total) by mouth every 6 (six) hours as needed for nausea or vomiting. 60 tablet 1   tadalafil (CIALIS) 10 MG tablet Take 1-2 tablets (10-20 mg total) by mouth daily as needed for erectile dysfunction (take 45 minutes prior to secual activity). 30 tablet 6   tamsulosin (FLOMAX) 0.4 MG CAPS capsule Take 1 capsule (0.4 mg total) by mouth daily after supper. 30 capsule 6   Zinc Sulfate (ZINC 15 PO) Take by mouth.     No current facility-administered medications for this visit.   Facility-Administered Medications Ordered in Other Visits  Medication Dose  Route Frequency Provider Last Rate Last Admin   atropine injection 0.5 mg  0.5 mg Intravenous Once PRN [name], MD       fluorouracil (ADRUCIL) 5,000 mg in sodium chloride 0.9 % 150 mL chemo infusion  2,160 mg/m2 (Treatment Plan Recorded) Intravenous 1 day or 1 dose Orlie Dakin, Tollie Pizza, MD       fluorouracil (ADRUCIL) chemo injection 800 mg  360 mg/m2 (Treatment Plan Recorded) Intravenous Once [name], MD       heparin lock flush 100 unit/mL  500 Units Intravenous Once [name], MD       irinotecan (CAMPTOSAR) 340 mg in sodium chloride 0.9 % 500 mL chemo infusion  160 mg/m2 (Treatment Plan Recorded) Intravenous Once [name], MD       leucovorin 782 mg in sodium chloride 0.9 % 250 mL infusion  360 mg/m2 (Treatment Plan Recorded) Intravenous Once [name], MD        OBJECTIVE: Vitals:   08/28/23 1117  BP: 92/64  Pulse: 68  Resp: 18  Temp: (!) 96.4 F (35.8 C)  SpO2: 100%       Body mass index is 31.6 kg/m.    ECOG FS:1 - Symptomatic but completely ambulatory  General: Well-developed, well-nourished, no acute  distress. Eyes: Pink conjunctiva, anicteric sclera. HEENT: Normocephalic, moist mucous membranes. Lungs: No audible wheezing or coughing. Heart: Regular rate and rhythm. Abdomen: Soft, nontender, no obvious distention. Musculoskeletal: No edema, cyanosis, or clubbing. Neuro: Alert, answering all questions appropriately. Cranial nerves grossly intact. Skin: No rashes or petechiae noted. Psych: Normal affect.  LAB RESULTS:  Lab Results  Component Value Date   NA 133 (L) 08/28/2023   K 4.4 08/28/2023   CL 108 08/28/2023   CO2 22 08/28/2023   GLUCOSE 115 (H) 08/28/2023   BUN 22 08/28/2023   CREATININE 1.49 (H) 08/28/2023   CALCIUM 8.1 (L) 08/28/2023   PROT 6.5 08/28/2023   ALBUMIN 3.1 (L) 08/28/2023   AST 27 08/28/2023   ALT 20 08/28/2023   ALKPHOS 197 (H) 08/28/2023   BILITOT 0.6 08/28/2023   GFRNONAA 51 (L) 08/28/2023   GFRAA >60 07/29/2020    Lab Results  Component Value Date   WBC 4.6 08/28/2023   NEUTROABS 3.3 08/28/2023   HGB 8.2 (L) 08/28/2023   HCT 26.1 (L) 08/28/2023   MCV 94.9 08/28/2023   PLT 129 (L) 08/28/2023   Lab Results  Component Value Date   IRON 79 08/13/2023   TIBC 234 (L) 08/13/2023   IRONPCTSAT 34 08/13/2023   Lab Results  Component Value Date   FERRITIN 616 (H) 07/09/2023     STUDIES: NM PET Image Restag (PS) Skull Base To Thigh  Result Date: 08/15/2023 CLINICAL DATA:  Subsequent treatment strategy for colon cancer. History of prostate cancer, multiple myeloma, and lymphoma. EXAM: NUCLEAR MEDICINE PET SKULL BASE TO THIGH TECHNIQUE: 10.9 mCi F-18 FDG was injected intravenously. Full-ring PET imaging was performed from the skull base to thigh after the radiotracer. CT data was obtained and used for attenuation correction and anatomic localization. Fasting blood glucose: 92 mg/dl COMPARISON:  Pylarify PET-CT dated 03/21/2023. FDG PET-CT dated 03/12/2023. FINDINGS: Mediastinal blood pool activity: SUV max 2.6 Liver activity: SUV max NA NECK: No  hypermetabolic lymph nodes in the neck. Incidental CT findings: None. CHEST: No hypermetabolic mediastinal or hilar nodes. No suspicious pulmonary nodules on the CT scan. Left chest port terminates in the lower SVC. Incidental CT findings: Cardiomegaly. Hypodense blood  pool relative to myocardium, suggesting anemia. ABDOMEN/PELVIS: Status post right hemicolectomy. No abnormal hypermetabolism in the liver, spleen, pancreas, or adrenal glands. 13 mm short axis aggregate retrocaval node (series 6/image 105), max SUV 5.8, previously 14.0. Incidental CT findings: Mildly nodular hepatic contour. Spleen is top-normal in size. Mild atherosclerotic calcifications of the abdominal aorta and branch vessels. SKELETON: Diffuse hypermetabolism in the visualized axial and appendicular skeleton, favored to reflect posttreatment changes. Incidental CT findings: Mild degenerative changes of the visualized thoracolumbar spine. IMPRESSION: Status post right hemicolectomy. Improving retroperitoneal nodal metastasis, as above. Diffuse osseous hypermetabolism, favoring post treatment changes. Electronically Signed   By: Charline Bills M.D.   On: 08/15/2023 00:03    ONCOLOGY HISTORY: Patient completed cycle 9 of adjuvant FOLFOX on June 03, 2020.  Given his difficulties with treatment and declining performance status, treatment was discontinued altogether. Colonoscopy on September 03, 2022 did not reveal any significant pathology and recommendation was to repeat in 3 years.    ASSESSMENT: Stage IIIc colon cancer, smoldering myeloma, prostate cancer.  PLAN:    Stage IIIc colon cancer:  CT scan from December 07, 2022 reviewed independently with enlarging aortocaval node now measuring 1.9 x 1.4 cm.  PET scan results from Mar 15, 2023 reviewed independently with progressive retroperitoneal lymphadenopathy presumably related to patient's colon cancer.  Patient CEA trended up and peaked at 194, but now is trending down.  His most recent  result is 79.1.  Today's result is pending.  Repeat PET scan on August 15, 2023 reviewed independently and reported as above with significant improvement of patient's disease burden.  Proceed with cycle 7 of treatment today.  Treatment was previously dose reduced 10% given his renal insufficiency and baseline pancytopenia.  Will hold Avastin at this time given his recent stroke.  Return to clinic in 2 days for pump removal and then in 2 weeks for further evaluation and consideration of cycle 8.  Will reimage at the conclusion of cycle 12.   Prostate cancer: Gleason's 3+4.  Patient's PSA increased and peaked at 39.34, but since completing treatment has decreased and is now 8.26.  Nuclear medicine bone scan on December 13, 2022 was reported as negative.  PSMA PET per radiation oncology from Mar 21, 2023 did not reveal metastatic disease.  Patient completed XRT for local control disease on May 30, 2023.  Repeat imaging as above.  Patient saw urology earlier today.  Appreciate their input.   Smoldering myeloma: Chronic and unchanged.  Patient was initially diagnosed and treated in [place], [place] and treated with single agent Velcade in approximately 2013.  He declined maintenance treatment or referral for bone marrow transplant.  His M spike has ranged from 1.1-2.0 since July 2020.  His most recent result was 1.6.  Over the same timeframe his IgG component has ranged from [phone].  His most recent result is 2344.  Finally, his kappa free light chains have ranged from 24.8 -75.3 with his most recent result reported at 36.2.  Nuclear med bone scan as above.  His most recent bone marrow biopsy completed on May 28, 2019 revealed only 10 to 15% plasma cells with no clonality reported.  Cytogenetics were also reported as normal.  Continue to monitor closely.  Anemia: Hemoglobin improved to 8.2 with transfusion.  Continue to monitor closely.   Neutropenia: Resolved.  Continue Udenyca with pump removal.  Dose  reduced FOLFIRI as above. Thrombocytopenia: Platelets improved to 129.  Proceed cautiously with treatment as above.   Renal  insufficiency: Creatinine improved to 1.49.  Neither irinotecan or 5-FU need to be dose reduced in the setting of renal insufficiency.  Patient reports he has not seen nephrology in several years.  If his creatinine continues to remain elevated, will give a referral back. Hypomagnesia: Magnesium decreased, but stable at 1.6.  Monitor. Peripheral neuropathy: Chronic and unchanged.  Patient reports gabapentin did not help.  He was recently initiated on Lyrica 50 mg daily and given a referral to neurology.   Hypotension: Improved.   Coping/anxiety: Patient was previously given a referral to [facility].  Patient expressed understanding and was in agreement with this plan. He also understands that He can call clinic at any time with any questions, concerns, or complaints.    [name], MD   08/28/2023 12:20 PM

## 2023-08-28 NOTE — Progress Notes (Signed)
08/28/2023 12:23 PM   Chris George [birth year] [id number]  Reason for visit: Prostate cancer, ED  HPI: 67 year old male with history of smoldering myeloma as well as stage III colon cancer treated with chemotherapy, surgery, and radiation who originally presented in November 2022 with a PSA of 15.  I recommended a prostate MRI but he never followed up.  PSA continued to increase, up to 42, and he ultimately underwent a prostate biopsy on 01/30/2023.  Prostate biopsy showed all cores involved with Gleason score 3+4=7, with high-volume and 100% max core involvement in multiple cores, and perineural invasion was present.  He was seen in late January by his oncologist Dr. Orlie Dakin, and CEA had also increased to 117 from 18 prior.  I personally viewed and interpreted the CT chest abdomen and pelvis and bone scan that was ordered by Dr. Orlie Dakin, which shows no bony metastatic disease, however an enlarged 2 cm aortocaval lymph node worrisome for metastatic disease, after further workup and evaluation with PSMA PET scan this was felt to be consistent with recurrent metastatic colon cancer.  He currently is undergoing chemotherapy through oncology, and I reviewed those notes at length.  With his comorbidities, he opted to complete external beam radiation for his prostate cancer, I do not see that he was given ADT.  He completed radiation with Dr. Rushie Chestnut in July 2024.  PSA has decreased, most recently 8.26 in October 2024, and we will continue to monitor closely trend.  He had multiple ER visits for rectal pain that were felt to be related to an anal fissure, this has been managed by GI with significant improvement in his symptoms.  He denies any pelvic pain or urinary symptoms today.  We reviewed he can discontinue Flomax if he is not having any urinary symptoms.  He reports worsening ED over the last year and is interested what options he might have.  We reviewed the risks and benefits of Cialis, and  he can take 10 to 20 mg on demand as needed.  We again reviewed his very complex history with recurrent metastatic colon cancer, localized prostate cancer with significantly elevated PSA to 42 and high-volume intermediate risk disease treated with radiation alone, need for monitoring PSA moving forward.  I think with his other comorbidities and chemotherapy, history of recent stroke, it is not unreasonable to defer ADT.  Trial of Cialis 10 to 20 mg on demand for ED Okay stop Flomax RTC 3 months PSA prior   I spent 40 total minutes on the day of the encounter including pre-visit review of the medical record, face-to-face time with the patient, and post visit ordering of labs/imaging/tests.  Extensive review of oncology and radiation oncology notes, ER visits, and prior imaging.  Sondra Come, MD  [facility] [address], [address] [place], [place] [address] [phone]

## 2023-08-29 ENCOUNTER — Encounter: Payer: Self-pay | Admitting: Oncology

## 2023-08-29 ENCOUNTER — Ambulatory Visit: Payer: Medicare HMO

## 2023-08-29 ENCOUNTER — Other Ambulatory Visit: Payer: Self-pay

## 2023-08-29 LAB — CEA: CEA: 99.2 ng/mL — ABNORMAL HIGH (ref 0.0–4.7)

## 2023-08-30 ENCOUNTER — Encounter: Payer: Self-pay | Admitting: Oncology

## 2023-08-30 ENCOUNTER — Inpatient Hospital Stay: Payer: Medicare HMO

## 2023-08-30 VITALS — BP 93/59 | HR 64 | Temp 97.0°F | Resp 16

## 2023-08-30 DIAGNOSIS — C184 Malignant neoplasm of transverse colon: Secondary | ICD-10-CM

## 2023-08-30 DIAGNOSIS — Z5111 Encounter for antineoplastic chemotherapy: Secondary | ICD-10-CM | POA: Diagnosis not present

## 2023-08-30 MED ORDER — HEPARIN SOD (PORK) LOCK FLUSH 100 UNIT/ML IV SOLN
500.0000 [IU] | Freq: Once | INTRAVENOUS | Status: AC | PRN
  Administered 2023-08-30: 500 [IU]
  Filled 2023-08-30: qty 5

## 2023-08-30 MED ORDER — PEGFILGRASTIM-JMDB 6 MG/0.6ML ~~LOC~~ SOSY
6.0000 mg | PREFILLED_SYRINGE | Freq: Once | SUBCUTANEOUS | Status: AC
  Administered 2023-08-30: 6 mg via SUBCUTANEOUS

## 2023-08-30 MED ORDER — SODIUM CHLORIDE 0.9% FLUSH
10.0000 mL | INTRAVENOUS | Status: DC | PRN
  Administered 2023-08-30: 10 mL
  Filled 2023-08-30: qty 10

## 2023-08-30 NOTE — Progress Notes (Signed)
[facility]-Final Intake Summary  Name Chris George  Date of Birth [birth year]  Chris George is a 33 yr. old black male, with a history of myeloma cancer in 2012, colon cancer in 2021 and prostate cancer 2024. Chris George was referred by Dr. Gerarda Fraction at [facility] for MDD, coping/anxiety. Chris George is not currently open to psychiatric medication recommendations at this time, due to current intake. However, Chris George would like to know if some medications can be removed from his daily regimen.  Assessments:  PHQ-9=7 Severity: Mild (5-9), reporting mild anhedonia, low energy levels, chronic pain, trouble falling asleep and poor appetite. Chris George reported to previously having some dark days due to diagnosis and can see how one might have thoughts of giving up, but is choosing to be optimistic. Chris George further reports favorable medical results aids in this, which boosts mood.  No Safety Concerns at this time  GAD-7=0 Severity: Minimal (0-4), reporting no symptoms of anxiety. Chris George reported that although various thoughts may enter mind during day or night, he is quick to dispel them. Psych Tx/Hx.:  Psychosocial Hx.:  Member lives alone, with family nearby approx. 4 min. away. Member reports having a good support system, especially his brother. Member enjoys tennis, learning new information, watching any type of sports-preferably baseball, basketball as well as reading and writing. Member reports being an avid reader with a book collection spanning well over 300 literary works, which he would like to donate at some point. Member is also eager to get back to some sort of physical activity or work outs.  Additional Services: Member is not interested in Health Coaching at this time due to currently having multiple providers. However, Member did express consideration should this list decrease.

## 2023-08-30 NOTE — Progress Notes (Unsigned)
Medication Consultation [facility]

## 2023-08-30 NOTE — Progress Notes (Signed)
[facility]-Final Intake Summary  Name Chris George, Chris George  Date of birth 07/11/1956/  Chris George is a 61 yr. old black male, with a history of myeloma cancer in 2012, colon cancer in 2021 and prostate cancer 2024. Chris George was referred by Dr. Gerarda Fraction at [facility] for MDD, coping/anxiety. Chris George is not currently open to psychiatric medication recommendations at this time, due to current intake. However, Chris George would like to know if some medications can be removed from his daily regimen.  Assessments:  PHQ-9=7 Severity: Mild (5-9), reporting mild anhedonia, low energy levels, chronic pain, trouble falling asleep and poor appetite. Chris George reported to previously having some dark days due to diagnosis and can see how one might have thoughts of giving up, but is choosing to be optimistic. Chris George further reports favorable medical results aids in this, which boosts mood.  No Safety Concerns at this time  GAD-7=0 Severity: Minimal (0-4), reporting no symptoms of anxiety. Chris George reported that although various thoughts may enter mind during day or night, he is quick to dispel them. Psych Tx/Hx.:  Psychosocial Hx.:  Member lives alone, with family nearby approx. 4 min. away. Member reports having a good support system, especially his brother. Member enjoys tennis, learning new information, watching any type of sports-preferably baseball, basketball as well as reading and writing. Member reports being an avid reader with a book collection spanning well over 300 literary works, which he would like to donate at some point. Member is also eager to get back to some sort of physical activity or work outs.  Additional Services: Member is not interested in Health Coaching at this time due to currently having multiple providers. However, Member did express consideration should this list decrease.

## 2023-09-02 ENCOUNTER — Other Ambulatory Visit: Payer: Self-pay

## 2023-09-02 ENCOUNTER — Telehealth: Payer: Self-pay | Admitting: *Deleted

## 2023-09-02 ENCOUNTER — Other Ambulatory Visit: Payer: Medicare HMO

## 2023-09-02 ENCOUNTER — Encounter: Payer: Medicare HMO | Admitting: Hospice and Palliative Medicine

## 2023-09-02 DIAGNOSIS — C184 Malignant neoplasm of transverse colon: Secondary | ICD-10-CM

## 2023-09-02 NOTE — Telephone Encounter (Signed)
Patient called reporting that since Friday after his injection, he has not felt well, having extreme weakness and fatigue. He is having difficulty gathering his thoughts to  tell me what is going on. He states that he is not eating much and he is trying to drink water though. He is having trouble going up the stairs. And finds that he is staying in bed more.He is asking what to do.

## 2023-09-03 ENCOUNTER — Encounter: Payer: Self-pay | Admitting: Hospice and Palliative Medicine

## 2023-09-03 ENCOUNTER — Other Ambulatory Visit: Payer: Self-pay | Admitting: *Deleted

## 2023-09-03 ENCOUNTER — Ambulatory Visit: Payer: Medicare HMO

## 2023-09-03 ENCOUNTER — Other Ambulatory Visit: Payer: Self-pay

## 2023-09-03 ENCOUNTER — Inpatient Hospital Stay (HOSPITAL_BASED_OUTPATIENT_CLINIC_OR_DEPARTMENT_OTHER): Payer: Medicare HMO | Admitting: Hospice and Palliative Medicine

## 2023-09-03 ENCOUNTER — Inpatient Hospital Stay: Payer: Medicare HMO

## 2023-09-03 ENCOUNTER — Other Ambulatory Visit: Payer: Medicare HMO

## 2023-09-03 VITALS — BP 102/74 | HR 75 | Temp 97.5°F | Resp 18 | Ht 69.0 in | Wt 214.0 lb

## 2023-09-03 DIAGNOSIS — D649 Anemia, unspecified: Secondary | ICD-10-CM

## 2023-09-03 DIAGNOSIS — C184 Malignant neoplasm of transverse colon: Secondary | ICD-10-CM

## 2023-09-03 DIAGNOSIS — Z5111 Encounter for antineoplastic chemotherapy: Secondary | ICD-10-CM | POA: Diagnosis not present

## 2023-09-03 DIAGNOSIS — R531 Weakness: Secondary | ICD-10-CM

## 2023-09-03 DIAGNOSIS — R638 Other symptoms and signs concerning food and fluid intake: Secondary | ICD-10-CM

## 2023-09-03 LAB — CMP (CANCER CENTER ONLY)
ALT: 15 U/L (ref 0–44)
AST: 20 U/L (ref 15–41)
Albumin: 2.9 g/dL — ABNORMAL LOW (ref 3.5–5.0)
Alkaline Phosphatase: 190 U/L — ABNORMAL HIGH (ref 38–126)
Anion gap: 5 (ref 5–15)
BUN: 25 mg/dL — ABNORMAL HIGH (ref 8–23)
CO2: 21 mmol/L — ABNORMAL LOW (ref 22–32)
Calcium: 8.2 mg/dL — ABNORMAL LOW (ref 8.9–10.3)
Chloride: 109 mmol/L (ref 98–111)
Creatinine: 1.36 mg/dL — ABNORMAL HIGH (ref 0.61–1.24)
GFR, Estimated: 57 mL/min — ABNORMAL LOW (ref >60)
Glucose, Bld: 151 mg/dL — ABNORMAL HIGH (ref 70–99)
Potassium: 4.7 mmol/L (ref 3.5–5.1)
Sodium: 135 mmol/L (ref 135–145)
Total Bilirubin: 1.1 mg/dL (ref 0.3–1.2)
Total Protein: 6.1 g/dL — ABNORMAL LOW (ref 6.5–8.1)

## 2023-09-03 LAB — CBC (CANCER CENTER ONLY)
HCT: 25.7 % — ABNORMAL LOW (ref 39.0–52.0)
Hemoglobin: 7.8 g/dL — ABNORMAL LOW (ref 13.0–17.0)
MCH: 29.8 pg (ref 26.0–34.0)
MCHC: 30.4 g/dL (ref 30.0–36.0)
MCV: 98.1 fL (ref 80.0–100.0)
Platelet Count: 61 10*3/uL — ABNORMAL LOW (ref 150–400)
RBC: 2.62 MIL/uL — ABNORMAL LOW (ref 4.22–5.81)
RDW: 18.7 % — ABNORMAL HIGH (ref 11.5–15.5)
WBC Count: 10 10*3/uL (ref 4.0–10.5)
nRBC: 0 % (ref 0.0–0.2)

## 2023-09-03 LAB — PREPARE RBC (CROSSMATCH)

## 2023-09-03 LAB — TSH: TSH: 2.774 u[IU]/mL (ref 0.350–4.500)

## 2023-09-03 LAB — FERRITIN: Ferritin: 686 ng/mL — ABNORMAL HIGH (ref 24–336)

## 2023-09-03 MED ORDER — DEXAMETHASONE SODIUM PHOSPHATE 10 MG/ML IJ SOLN
6.0000 mg | Freq: Once | INTRAMUSCULAR | Status: AC
  Administered 2023-09-03: 6 mg via INTRAVENOUS
  Filled 2023-09-03: qty 1

## 2023-09-03 MED ORDER — SODIUM CHLORIDE 0.9 % IV SOLN
INTRAVENOUS | Status: DC
  Filled 2023-09-03 (×2): qty 250

## 2023-09-03 MED ORDER — SODIUM CHLORIDE 0.9% FLUSH
10.0000 mL | Freq: Once | INTRAVENOUS | Status: AC
  Administered 2023-09-03: 10 mL via INTRAVENOUS
  Filled 2023-09-03: qty 10

## 2023-09-03 MED ORDER — HEPARIN SOD (PORK) LOCK FLUSH 100 UNIT/ML IV SOLN
500.0000 [IU] | Freq: Once | INTRAVENOUS | Status: AC
Start: 2023-09-03 — End: 2023-09-03
  Administered 2023-09-03: 500 [IU] via INTRAVENOUS
  Filled 2023-09-03: qty 5

## 2023-09-03 NOTE — Progress Notes (Signed)
Symptom Management Clinic [facility] at [facility] [phone] [phone]  Patient Care Team: Marina Goodell, MD as PCP - General (Family Medicine) [name], MD as Consulting Physician (Oncology) Morene Crocker, MD as Referring Physician (Neurology) Lamar Blinks, MD as Consulting Physician (Cardiology)   NAME OF PATIENT: Chris George  [id number]  Mar 11, 1956   DATE OF VISIT: 09/03/23  REASON FOR CONSULT: Chris George is a 67 y.o. male with multiple medical problems including recurrent stage IIIc colon cancer, smoldering myeloma, and prostate cancer.  History of stroke with residual expressive aphasia.  INTERVAL HISTORY: Patient saw Dr. Orlie Dakin on 08/28/2023 for cycle 7 FOLFIRI.  He continued to have chronic weakness and fatigue but symptoms improved some after receiving blood transfusion.    Patient was referred to [facility] for evaluation fatigue.   Patient reports that he has had severe fatigue and weakness since he last received chemotherapy.  He complains of a pattern of feeling poorly after each cycle of chemotherapy.  Oral intake decreases and he does not have much energy or quality of life.  He denies fever or chills.  No respiratory symptoms.  Does endorse chronic constipation and has been taking MiraLAX for that.  He has been followed by GI for anal fissure.  He says that his anal fissure was improving with a cream prescribed by GI but recently has started bleeding again due to recurrent constipation.  Denies recent fevers or illnesses. Denies any easy bleeding or bruising. Reports weight loss. Denies chest pain. Denies any nausea, vomiting. Denies urinary complaints. Patient offers no further specific complaints today.   PAST MEDICAL HISTORY: Past Medical History:  Diagnosis Date   A-fib (HCC)    Anemia    Arthritis    ankles   Cancer (HCC)    multiple myeloma   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HCC) 10/26/2019    Cardiomyopathy (HCC)    Chemotherapy-induced peripheral neuropathy (HCC)    CHF (congestive heart failure) (HCC)    Depression    Dysrhythmia    atrial fib   History of CVA (cerebrovascular accident)    HLD (hyperlipidemia)    Hypercholesteremia    Hypertension    Moderate tricuspid insufficiency    Multiple myeloma (HCC)    not being treated right now per pt 11/11/19   Pneumonia    Sleep apnea    No CPAP.  OSA resolved with wt loss.    PAST SURGICAL HISTORY:  Past Surgical History:  Procedure Laterality Date   ATRIAL ABLATION SURGERY     CARDIAC SURGERY     A-Fib Ablations   COLONOSCOPY WITH PROPOFOL N/A 10/26/2019   Procedure: COLONOSCOPY WITH BIOPSY;  Surgeon: [name], MD;  Location: [facility];  Service: Endoscopy;  Laterality: N/A;   COLONOSCOPY WITH PROPOFOL N/A 09/03/2022   Procedure: COLONOSCOPY WITH PROPOFOL;  Surgeon: [name], MD;  Location: [facility];  Service: Endoscopy;  Laterality: N/A;   ESOPHAGOGASTRODUODENOSCOPY (EGD) WITH PROPOFOL N/A 10/26/2019   Procedure: ESOPHAGOGASTRODUODENOSCOPY (EGD) WITH BIOPSY;  Surgeon: [name], MD;  Location: [facility];  Service: Endoscopy;  Laterality: N/A;  sleep apnea   LAPAROSCOPIC PARTIAL COLECTOMY Right 11/17/2019   Procedure: LAPAROSCOPIC PARTIAL COLECTOMY RIGHT EXTENDED;  Surgeon: [name], MD;  Location: [facility];  Service: General;  Laterality: Right;   PORTACATH PLACEMENT N/A 12/28/2019   Procedure: INSERTION PORT-A-CATH Left subclavian;  Surgeon: [name], MD;  Location: [facility];  Service: General;  Laterality:  N/A;    HEMATOLOGY/ONCOLOGY HISTORY:  Oncology History  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HCC)  10/27/2019 Initial Diagnosis   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HCC)   12/01/2019 Cancer Staging   Staging form: Colon and Rectum, AJCC 8th Edition - Clinical stage from 12/01/2019: Stage IIIC (cT3, cN2b, cM0) - Signed by [name], MD on 12/01/2019   12/30/2019 -  06/03/2020 Chemotherapy   Patient is on Treatment Plan : COLORECTAL FOLFOX q14d x 6 months     05/15/2023 -  Chemotherapy   Patient is on Treatment Plan : COLORECTAL FOLFIRI q14d       ALLERGIES:  has No Known Allergies.  MEDICATIONS:  Current Outpatient Medications  Medication Sig Dispense Refill   allopurinol (ZYLOPRIM) 100 MG tablet Take 200 mg by mouth daily.     apixaban (ELIQUIS) 5 MG TABS tablet Take 5 mg by mouth 2 (two) times daily.     APPLE CIDER VINEGAR PO Take 30-45 mLs by mouth every other day.      atorvastatin (LIPITOR) 80 MG tablet Take 80 mg by mouth daily.     colchicine 0.6 MG tablet Take 1 tablet (0.6 mg total) by mouth 2 (two) times daily. Until flare resolves. 20 tablet 1   dicyclomine (BENTYL) 20 MG tablet Take by mouth.     furosemide (LASIX) 80 MG tablet Take 40 mg by mouth daily as needed for edema.     gabapentin (NEURONTIN) 100 MG capsule Take 300 mg by mouth 2 (two) times daily.     HYDROcodone-acetaminophen (NORCO) 5-325 MG tablet Take 1 tablet by mouth every 6 (six) hours as needed for moderate pain. 30 tablet 0   hydrocortisone (ANUSOL-HC) 2.5 % rectal cream Place 1 Application rectally 2 (two) times daily. 30 g 0   hydrocortisone cream 1 % Apply 1 Application topically 2 (two) times daily.     Lidocaine, Anorectal, 5 % GEL Apply 1 Application topically as needed. 30 g 1   lidocaine-prilocaine (EMLA) cream Apply 1 Application topically daily.     loperamide (IMODIUM A-D) 2 MG tablet Take 2 mg by mouth 4 (four) times daily as needed for diarrhea or loose stools.     MAGNESIUM PO Take by mouth.     mirtazapine (REMERON) 7.5 MG tablet Take 1 tablet (7.5 mg total) by mouth at bedtime. 30 tablet 3   Misc Natural Products (TART CHERRY ADVANCED PO) Take 60 mLs by mouth every other day.     Multiple Vitamins-Minerals (ZINC PO) Take by mouth.     nifedipine 0.3 % ointment Place 1 Application rectally 4 (four) times daily. with Lidocaine 1.5% 30 g 0   potassium  bicarbonate (K-LYTE) 25 MEQ disintegrating tablet Take 25 mEq by mouth 2 (two) times daily. In the afternoon.     prochlorperazine (COMPAZINE) 10 MG tablet Take 1 tablet (10 mg total) by mouth every 6 (six) hours as needed for nausea or vomiting. 60 tablet 1   tadalafil (CIALIS) 10 MG tablet Take 1-2 tablets (10-20 mg total) by mouth daily as needed for erectile dysfunction (take 45 minutes prior to secual activity). 30 tablet 6   tamsulosin (FLOMAX) 0.4 MG CAPS capsule Take 1 capsule (0.4 mg total) by mouth daily after supper. 30 capsule 6   Zinc Sulfate (ZINC 15 PO) Take by mouth.     No current facility-administered medications for this visit.   Facility-Administered Medications Ordered in Other Visits  Medication Dose Route Frequency Provider Last Rate Last Admin  heparin lock flush 100 unit/mL  500 Units Intravenous Once [name], MD        VITAL SIGNS: There were no vitals taken for this visit. There were no vitals filed for this visit.  Estimated body mass index is 31.6 kg/m as calculated from the following:   Height as of 08/28/23: 5\' 9"  (1.753 m).   Weight as of 08/28/23: 214 lb (97.1 kg).  LABS: CBC:    Component Value Date/Time   WBC 4.6 08/28/2023 1057   WBC 4.7 07/23/2023 1431   HGB 8.2 (L) 08/28/2023 1057   HGB 14.3 05/13/2013 1116   HCT 26.1 (L) 08/28/2023 1057   HCT 42.9 05/13/2013 1116   PLT 129 (L) 08/28/2023 1057   PLT 169 05/13/2013 1116   MCV 94.9 08/28/2023 1057   MCV 84 05/13/2013 1116   NEUTROABS 3.3 08/28/2023 1057   NEUTROABS 2.3 05/13/2013 1116   LYMPHSABS 0.5 (L) 08/28/2023 1057   LYMPHSABS 0.9 (L) 05/13/2013 1116   MONOABS 0.7 08/28/2023 1057   MONOABS 0.4 05/13/2013 1116   EOSABS 0.1 08/28/2023 1057   EOSABS 0.1 05/13/2013 1116   BASOSABS 0.0 08/28/2023 1057   BASOSABS 0.0 05/13/2013 1116   Comprehensive Metabolic Panel:    Component Value Date/Time   NA 133 (L) 08/28/2023 1057   NA 143 05/13/2013 1116   K 4.4 08/28/2023  1057   K 3.5 05/13/2013 1116   CL 108 08/28/2023 1057   CL 102 05/13/2013 1116   CO2 22 08/28/2023 1057   CO2 32 05/13/2013 1116   BUN 22 08/28/2023 1057   BUN 14 05/13/2013 1116   CREATININE 1.49 (H) 08/28/2023 1057   CREATININE 1.53 (H) 05/13/2013 1116   GLUCOSE 115 (H) 08/28/2023 1057   GLUCOSE 122 (H) 05/13/2013 1116   CALCIUM 8.1 (L) 08/28/2023 1057   CALCIUM 8.6 05/13/2013 1116   AST 27 08/28/2023 1057   ALT 20 08/28/2023 1057   ALT 37 07/24/2012 1915   ALKPHOS 197 (H) 08/28/2023 1057   ALKPHOS 129 07/24/2012 1915   BILITOT 0.6 08/28/2023 1057   PROT 6.5 08/28/2023 1057   PROT 8.4 (H) 07/24/2012 1915   ALBUMIN 3.1 (L) 08/28/2023 1057   ALBUMIN 4.0 07/24/2012 1915    RADIOGRAPHIC STUDIES: NM PET Image Restag (PS) Skull Base To Thigh  Result Date: 08/15/2023 CLINICAL DATA:  Subsequent treatment strategy for colon cancer. History of prostate cancer, multiple myeloma, and lymphoma. EXAM: NUCLEAR MEDICINE PET SKULL BASE TO THIGH TECHNIQUE: 10.9 mCi F-18 FDG was injected intravenously. Full-ring PET imaging was performed from the skull base to thigh after the radiotracer. CT data was obtained and used for attenuation correction and anatomic localization. Fasting blood glucose: 92 mg/dl COMPARISON:  Pylarify PET-CT dated 03/21/2023. FDG PET-CT dated 03/12/2023. FINDINGS: Mediastinal blood pool activity: SUV max 2.6 Liver activity: SUV max NA NECK: No hypermetabolic lymph nodes in the neck. Incidental CT findings: None. CHEST: No hypermetabolic mediastinal or hilar nodes. No suspicious pulmonary nodules on the CT scan. Left chest port terminates in the lower SVC. Incidental CT findings: Cardiomegaly. Hypodense blood pool relative to myocardium, suggesting anemia. ABDOMEN/PELVIS: Status post right hemicolectomy. No abnormal hypermetabolism in the liver, spleen, pancreas, or adrenal glands. 13 mm short axis aggregate retrocaval node (series 6/image 105), max SUV 5.8, previously 14.0.  Incidental CT findings: Mildly nodular hepatic contour. Spleen is top-normal in size. Mild atherosclerotic calcifications of the abdominal aorta and branch vessels. SKELETON: Diffuse hypermetabolism in the visualized axial and appendicular skeleton, favored  to reflect posttreatment changes. Incidental CT findings: Mild degenerative changes of the visualized thoracolumbar spine. IMPRESSION: Status post right hemicolectomy. Improving retroperitoneal nodal metastasis, as above. Diffuse osseous hypermetabolism, favoring post treatment changes. Electronically Signed   By: Charline Bills M.D.   On: 08/15/2023 00:03    PERFORMANCE STATUS (ECOG) : 2 - Symptomatic, <50% confined to bed  Review of Systems Unless otherwise noted, a complete review of systems is negative.  Physical Exam General: NAD Cardiovascular: regular rate and rhythm Pulmonary: clear ant fields Abdomen: soft, nontender, + bowel sounds GU: no suprapubic tenderness Extremities: no edema, no joint deformities Skin: no rashes Neurological: Weakness, occasional expressive aphasia  IMPRESSION/PLAN: Colorectal cancer -on treatment FOLFIRI.  Recent PET scan showed improving retroperitoneal nodal metastasis.  Prostate cancer -followed by urology  Myeloma -on surveillance  Fatigue -likely multifactorial from chemotherapy, cancer, anemia, and poor oral intake.  Patient says that he is tired of taking chemotherapy.  Discussed possible option of a treatment holiday.  Discussed with Dr. Orlie Dakin who would like for patient to keep scheduled follow-up next week but patient unlikely to receive cancer treatment on that date.  In the interim, will proceed with supportive care today and PRBC transfusion tomorrow.  Constipation -follow-up with GI.  Patient on nifedipine cream for anal fissure.  Recommend consistent bowel regimen with MiraLAX/senna with target of 1 soft stool daily.  Follow-up next week with Dr. Orlie Dakin.  Case and plan discussed  with Dr. Orlie Dakin of the day.  Patient expressed understanding and was in agreement with this plan. He also understands that He can call clinic at any time with any questions, concerns, or complaints.   Thank you for allowing me to participate in the care of this very pleasant patient.   Time Total: 25 minutes  Visit consisted of counseling and education dealing with the complex and emotionally intense issues of symptom management in the setting of serious illness.Greater than 50%  of this time was spent counseling and coordinating care related to the above assessment and plan.  Signed by: Laurette Schimke, PhD, NP-C

## 2023-09-03 NOTE — Progress Notes (Signed)
Pt to return to clinic 10/30 for one unit pRBCs. Port left accessed.

## 2023-09-04 ENCOUNTER — Ambulatory Visit: Payer: Medicare HMO

## 2023-09-04 ENCOUNTER — Inpatient Hospital Stay: Payer: Medicare HMO

## 2023-09-04 ENCOUNTER — Other Ambulatory Visit: Payer: Medicare HMO

## 2023-09-04 ENCOUNTER — Ambulatory Visit: Payer: Medicare HMO | Admitting: Oncology

## 2023-09-04 DIAGNOSIS — D649 Anemia, unspecified: Secondary | ICD-10-CM

## 2023-09-04 DIAGNOSIS — C184 Malignant neoplasm of transverse colon: Secondary | ICD-10-CM

## 2023-09-04 DIAGNOSIS — Z5111 Encounter for antineoplastic chemotherapy: Secondary | ICD-10-CM | POA: Diagnosis not present

## 2023-09-04 MED ORDER — DIPHENHYDRAMINE HCL 25 MG PO CAPS
25.0000 mg | ORAL_CAPSULE | Freq: Once | ORAL | Status: AC
Start: 2023-09-04 — End: 2023-09-04
  Administered 2023-09-04: 25 mg via ORAL
  Filled 2023-09-04: qty 1

## 2023-09-04 MED ORDER — SODIUM CHLORIDE 0.9% FLUSH
10.0000 mL | INTRAVENOUS | Status: AC | PRN
  Administered 2023-09-04: 10 mL
  Filled 2023-09-04: qty 10

## 2023-09-04 MED ORDER — HEPARIN SOD (PORK) LOCK FLUSH 100 UNIT/ML IV SOLN
250.0000 [IU] | INTRAVENOUS | Status: AC | PRN
  Administered 2023-09-04: 250 [IU]
  Filled 2023-09-04: qty 5

## 2023-09-04 MED ORDER — ACETAMINOPHEN 325 MG PO TABS
650.0000 mg | ORAL_TABLET | Freq: Once | ORAL | Status: AC
  Administered 2023-09-04: 650 mg via ORAL
  Filled 2023-09-04: qty 2

## 2023-09-04 MED ORDER — SODIUM CHLORIDE 0.9% IV SOLUTION
250.0000 mL | INTRAVENOUS | Status: DC
  Administered 2023-09-04: 100 mL via INTRAVENOUS
  Filled 2023-09-04: qty 250

## 2023-09-04 NOTE — Progress Notes (Signed)
Nutrition Assessment   Reason for Assessment:  Poor appetite   ASSESSMENT:  67 year old male with recurrent stage III colon cancer, smoldering myeloma and prostate cancer.  Past medical history of stroke, expressive aphasia, afib, cardiomyopathy, CHF, HTN.  Patient receiving folfiri.   Met with patient during blood transfusion.  Patient reports poor appetite, pain from hemorrhoids and anal fissures, weight loss, fatigue, weakness.  Reports that he does not want to eat because causes pain when he has a stool.  Says that metamucil helps soften stool.  Seeing GI for follow-up next week.  Has tried oral nutrition supplement but does not like them.  Yesterday able to eat chicken sandwich with lettuce and tomato, strawberries and kiwi.  Mostly drinks water and juice.  Says he felt better after steroids and fluids   Medications: remeron, MVI   Labs: reviewed   Anthropometrics:   Height: 69 inches Weight: 214 lb on 10/29 217 lb on 8/10 220 lb 03/18/23 BMI: 31  3% weight loss in the last 5 months   Estimated Energy Needs  Kcals: 2425 Protein: 121 g Fluid: 2425 ml   NUTRITION DIAGNOSIS: Inadequate oral intake related to cancer related treatment side effects as evidenced by 3% weight loss in the last 5 months and decreased oral intake    INTERVENTION:  Encouraged fluid to help with constipation Discussed ways to increase fiber in diet.  Encouraged small frequent meals/snacks Contact information provided   MONITORING, EVALUATION, GOAL: weight trends, intake   Next Visit: Wednesday, Nov 20th during infusion  Chris George B. Chris George, RD, LDN Registered Dietitian [phone]

## 2023-09-04 NOTE — Patient Instructions (Signed)

## 2023-09-05 ENCOUNTER — Encounter: Payer: Self-pay | Admitting: Oncology

## 2023-09-05 LAB — TYPE AND SCREEN
ABO/RH(D): O POS
Antibody Screen: NEGATIVE
Unit division: 0

## 2023-09-05 LAB — BPAM RBC
Blood Product Expiration Date: 202411202359
ISSUE DATE / TIME: 202410300919
Unit Type and Rh: 5100

## 2023-09-05 NOTE — Progress Notes (Signed)
[facility] Care Clinical Summary 2024-09 Name: Chris George, Mion: [birth year] Patient MRN: [id number]  Referring Provider: Gerarda Fraction [facility] Name: Chris George Date of Last Psychiatric Consultant Review: 2023-08-05  Completed Behavioral Health Coach Visits  Completed [facility] 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 No new action requested. Please reference last Care Plan for most up-to-date behavioral health recommendations. Safety Plan: No Safety Plan Date:

## 2023-09-10 ENCOUNTER — Encounter: Payer: Self-pay | Admitting: Oncology

## 2023-09-10 ENCOUNTER — Ambulatory Visit: Payer: Medicare HMO | Attending: Family Medicine

## 2023-09-10 DIAGNOSIS — R4701 Aphasia: Secondary | ICD-10-CM | POA: Insufficient documentation

## 2023-09-10 DIAGNOSIS — R482 Apraxia: Secondary | ICD-10-CM | POA: Insufficient documentation

## 2023-09-10 NOTE — Therapy (Signed)
OUTPATIENT SPEECH LANGUAGE PATHOLOGY APHASIA TREATMENT     Patient Name: Chris George MRN: [id number] DOB:Mar 23, 1956, 67 y.o., male Today's Date: 09/10/2023  PCP: Vallarie Mare REFERRING PROVIDER: Maryjane Hurter, MD   End of Session - 09/10/23 1248     Visit Number 24    Number of Visits 48    Date for SLP Re-Evaluation 10/07/23    Progress Note Due on Visit 30    SLP Start Time 1100    SLP Stop Time  1145    SLP Time Calculation (min) 45 min    Activity Tolerance Treatment limited secondary to medical complications (Comment)   sequelae of chemo            Past Medical History:  Diagnosis Date   A-fib (HCC)    Anemia    Arthritis    ankles   Cancer (HCC)    multiple myeloma   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HCC) 10/26/2019   Cardiomyopathy (HCC)    Chemotherapy-induced peripheral neuropathy (HCC)    CHF (congestive heart failure) (HCC)    Depression    Dysrhythmia    atrial fib   History of CVA (cerebrovascular accident)    HLD (hyperlipidemia)    Hypercholesteremia    Hypertension    Moderate tricuspid insufficiency    Multiple myeloma (HCC)    not being treated right now per pt 11/11/19   Pneumonia    Sleep apnea    No CPAP.  OSA resolved with wt loss.   Past Surgical History:  Procedure Laterality Date   ATRIAL ABLATION SURGERY     CARDIAC SURGERY     A-Fib Ablations   COLONOSCOPY WITH PROPOFOL N/A 10/26/2019   Procedure: COLONOSCOPY WITH BIOPSY;  Surgeon: [name], MD;  Location: [facility];  Service: Endoscopy;  Laterality: N/A;   COLONOSCOPY WITH PROPOFOL N/A 09/03/2022   Procedure: COLONOSCOPY WITH PROPOFOL;  Surgeon: [name], MD;  Location: [facility];  Service: Endoscopy;  Laterality: N/A;   ESOPHAGOGASTRODUODENOSCOPY (EGD) WITH PROPOFOL N/A 10/26/2019   Procedure: ESOPHAGOGASTRODUODENOSCOPY (EGD) WITH BIOPSY;  Surgeon: [name], MD;  Location: [facility];  Service: Endoscopy;  Laterality: N/A;  sleep apnea    LAPAROSCOPIC PARTIAL COLECTOMY Right 11/17/2019   Procedure: LAPAROSCOPIC PARTIAL COLECTOMY RIGHT EXTENDED;  Surgeon: [name], MD;  Location: [facility];  Service: General;  Laterality: Right;   PORTACATH PLACEMENT N/A 12/28/2019   Procedure: INSERTION PORT-A-CATH Left subclavian;  Surgeon: [name], MD;  Location: [facility];  Service: General;  Laterality: N/A;   Patient Active Problem List   Diagnosis Date Noted   HFrEF (heart failure with reduced ejection fraction) (HCC) 06/12/2023   Drug-induced polyneuropathy (HCC) 04/22/2023   Hepatic cirrhosis (HCC) 04/22/2023   Major depressive disorder, single episode, mild (HCC) 04/22/2023   Malignant neoplasm of prostate (HCC) 04/22/2023   Secondary hyperparathyroidism of renal origin (HCC) 04/22/2023   History of CVA (cerebrovascular accident) 04/05/2023   Personal history of colon cancer    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   Diabetes mellitus type 2, controlled, without complications (HCC) 08/07/2021   Neuropathy 08/30/2020   Port-A-Cath in place    Abnormal ECG 11/04/2019   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HCC) 10/27/2019   Goals of care, counseling/discussion 05/18/2019   Multiple myeloma (HCC) 05/17/2019   Anemia 05/12/2019   Arthritis of left ankle 02/05/2018   Cardiomyopathy, idiopathic (HCC) 01/07/2018   Moderate tricuspid insufficiency 01/01/2017   OSA (obstructive sleep apnea) 01/01/2017   Chronic a-fib (HCC) 12/06/2015  Pure hypercholesterolemia 02/09/2015   Anticoagulant long-term use 04/29/2014   Hyperlipidemia 04/29/2014   Hypertension, essential, benign 04/26/2014    ONSET DATE: 04/05/23  REFERRING DIAG: aphasia  THERAPY DIAG:  Aphasia  Apraxia of speech  Rationale for Evaluation and Treatment Rehabilitation  SUBJECTIVE:   SUBJECTIVE STATEMENT: Pt alert and cooperative. Stated he is meeting with MD to discuss further postponing chemo.  Pt accompanied by: self  PERTINENT  HISTORY: Patient is a 67 y.o. 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 Pt with PMHx of ca of the transverse colon, multiple myeloma, lymphoma, DM, HTN, HLD, CKD, and recently diagnosed prostate cancer. Pt undergoing radiation tx for cancer and reports plan to possibly resume chemotherapy.   DIAGNOSTIC FINDINGS:  MRI brain 04/06/23, "MRI brain findings-Acute infarct in the left parietal lobe with small focus of infarct in the right temporal lobe (5:41, 5:38) evident on diffusion-weighted imaging and FLAIR sequences. A few nodular foci of low signal on susceptibility weighted imaging in the region of left parietal infarct (for example 17:46) may represent small areas of microhemorrhage or cortical vessel thrombus.Mild cerebral volume loss with ex vacuo dilatation of the CSF-containing spaces. There are scattered in confluent foci of signal abnormality within the periventricular and deep white matter.  These are nonspecific but commonly seen with small vessel ischemic changes. There is no midline shift. No extra-axial fluid collection. No mass. There is no abnormal enhancement."  PAIN:  Are you having pain? No  FALLS: Has patient fallen in last 6 months?  No  LIVING ENVIRONMENT: Lives with: lives alone Lives in: House/apartment  PLOF:  Level of assistance: Independent with ADLs, Independent with IADLs Employment: Retired   PATIENT GOALS    to be able to participate in complex conversation with family and friends; to understanding what he is reading; to improve QoL; to be more independent   OBJECTIVE:  TODAY'S TREATMENT:  Skilled SLP treatment provided targeting expressive communication.   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 Revisions and hesitation noted with successful repair of communication breakdowns for either anomia or apraxia of speech >95% of the time. Rare cues  for wordfinding/sequencing of sounds. Supportive counseling provided re: current speech/language deficits, communication strategies, effects of physical health and mood on cognitive-linguistic functioning, and progress to date. Pt appreciative of SLP for listening.   Pt endorsed being able to have conversations with multiple communication partners. He also said that he attended church this weekend and has been reading when he's felt well. Pt noted continued frustration with writing ability and had difficulty completing homework when he was not feeling well. Pt complete by next session. Discussed whether or not pt would like to continue tx given continued illness. Pt stated tx gives him "something to look forward to." Pt wishes to continue at this time.     PATIENT EDUCATION: Education details: as above Person educated: Patient Education method: Explanation Education comprehension: verbalized understanding; needs further reinforcement  HOME EXERCISE PROGRAM:   Writing HEP     GOALS:  Goals reviewed with patient? Yes  SHORT TERM GOALS: Target date: 10 sessions; revised goal - additional 10 weeks  Patient will demonstrate improved expressive language skills during communication breakdowns by using multimodal means (semantic feature analysis, circumlocution, synonym/antonym use, gestures, writing, drawing) to repair with mod cues. Baseline: Goal status: MET  2.  With Moderate A, patient will complete a semantic feature analysis with at  least 3-5 relevant features for 8/10 target words with cues to improve word-finding skills.  Baseline:  Goal status: MET  3.  With min A, patient will generate sentences with 5 or more words during structured task at 80% accuracy in order to increase ability to communicate basic wants and needs.  Baseline:  Goal status: MET  4.  Pt will participate in further assessment of functional reading and writing to help determine level of assistance needed for  language based iADL tasks. Baseline:  Goal status: MET  5.  Patient will follow two step commands 70% acc in context of a functional activity.  Baseline:  Goal status: MET  **New goals added 06/03/23; target date: 10 sessions   6. Pt will co-create x2 scripts for improved participation in medical appointments and                  social events with moderate assistance.    Baseline:    Goal status: MET                7. Pt will create simple text messages to desired communication partners with min A.    Baseline:    Goal status: MET  **New goals added 9/9; target 10 sessions   8. Pt will compose simple emails to desired communication partners with min A. .    Baseline:   Goal status: IN PROGRESS    9. Pt will utilize compensations for improved reading comprehension/retention with min A   Baseline:    Goal status: MET   10. Pt will communicate complex ideas (e.g. multiple viewpoints, pros/cons) with min A.    Baseline:   Goal status: IN PROGRESS   LONG TERM GOALS: Target date: 12 weeks; new date 10/07/23  Patient will demonstrate knowledge of appropriate activities to support functional receptive and expressive language outside of ST with assistance from family.  Baseline:  Goal status: MET  2.  Patient will report improved confidence in conversations outside of home (appointments, friends, on the phone) than prior to ST, as measured by Communication Effectiveness Survey.  Baseline:  15/32; 21/32 Goal status: REVISED; continue goal x1  3.  Patient and/or family will report use of strategies outside of ST to improve communication (use of scripts, pre-planning, semantic feature analysis, supported conversation).  Baseline:  Goal status: MET  4.  Patient will demonstrate improved ability with describing events, storytelling, or sharing medical information through personalized strategies (requesting time, using SFA, drawing, etc) as measured by Communication Effectiveness  Survey.  Baseline:  Goal status: MET  **New goals initiated 9/9:  5. Pt will report use of compensations for preferred writing and reading tasks.   Baseline:   Goals status: INITIAL  6. Pt will communicate complex ideas with modified independence.  Baseline: Goal status: INITIAL  ASSESSMENT:  CLINICAL IMPRESSION: Patient is a 67 y.o. 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 Pt making good progress toward goals despite ongoing medical problems and personal stressors. Pt remains highly motivated to achieve ST goals. 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 Pt making good progress toward goals. See details of tx session aboveI recommend continued course of skilled SLP services targeting functional receptive and expressive communication to improve pt's participation in iADLs, preferred activities, and QoL.   OBJECTIVE IMPAIRMENTS include expressive language, receptive language, and aphasia.  These impairments are limiting patient from managing medications, managing appointments, managing finances, household responsibilities, ADLs/IADLs, and effectively communicating at home and in community. Factors affecting potential to achieve goals and functional outcome are co-morbidities. Patient will benefit from skilled SLP services to address above impairments and improve overall function.  REHAB POTENTIAL: Excellent  PLAN: SLP FREQUENCY: 1x/week until completion of chemotx; then, resume 2x/week  SLP DURATION: 12 weeks  PLANNED INTERVENTIONS: Cueing hierachy, Internal/external aids, Functional tasks, Multimodal communication approach, SLP instruction and feedback, Compensatory strategies, and Patient/family education    Clyde Canterbury, M.S., CCC-SLP Speech-Language Pathologist  [facility] at [facility] [address] [place], [place], [address] Phone: [phone]   Fax:  [phone]

## 2023-09-11 ENCOUNTER — Encounter: Payer: Self-pay | Admitting: Nurse Practitioner

## 2023-09-11 ENCOUNTER — Inpatient Hospital Stay: Payer: Medicare HMO

## 2023-09-11 ENCOUNTER — Inpatient Hospital Stay: Payer: Medicare HMO | Attending: Oncology

## 2023-09-11 ENCOUNTER — Inpatient Hospital Stay (HOSPITAL_BASED_OUTPATIENT_CLINIC_OR_DEPARTMENT_OTHER): Payer: Medicare HMO | Admitting: Nurse Practitioner

## 2023-09-11 VITALS — BP 104/68 | HR 101 | Temp 97.8°F | Wt 216.0 lb

## 2023-09-11 DIAGNOSIS — D696 Thrombocytopenia, unspecified: Secondary | ICD-10-CM | POA: Diagnosis not present

## 2023-09-11 DIAGNOSIS — C182 Malignant neoplasm of ascending colon: Secondary | ICD-10-CM | POA: Insufficient documentation

## 2023-09-11 DIAGNOSIS — I959 Hypotension, unspecified: Secondary | ICD-10-CM | POA: Diagnosis not present

## 2023-09-11 DIAGNOSIS — Z5111 Encounter for antineoplastic chemotherapy: Secondary | ICD-10-CM | POA: Insufficient documentation

## 2023-09-11 DIAGNOSIS — T451X5A Adverse effect of antineoplastic and immunosuppressive drugs, initial encounter: Secondary | ICD-10-CM

## 2023-09-11 DIAGNOSIS — F419 Anxiety disorder, unspecified: Secondary | ICD-10-CM | POA: Insufficient documentation

## 2023-09-11 DIAGNOSIS — C184 Malignant neoplasm of transverse colon: Secondary | ICD-10-CM

## 2023-09-11 DIAGNOSIS — R531 Weakness: Secondary | ICD-10-CM

## 2023-09-11 DIAGNOSIS — Z79899 Other long term (current) drug therapy: Secondary | ICD-10-CM | POA: Diagnosis not present

## 2023-09-11 DIAGNOSIS — D649 Anemia, unspecified: Secondary | ICD-10-CM | POA: Insufficient documentation

## 2023-09-11 DIAGNOSIS — D6959 Other secondary thrombocytopenia: Secondary | ICD-10-CM | POA: Diagnosis not present

## 2023-09-11 DIAGNOSIS — C61 Malignant neoplasm of prostate: Secondary | ICD-10-CM | POA: Insufficient documentation

## 2023-09-11 DIAGNOSIS — R600 Localized edema: Secondary | ICD-10-CM | POA: Diagnosis not present

## 2023-09-11 DIAGNOSIS — Z5189 Encounter for other specified aftercare: Secondary | ICD-10-CM | POA: Insufficient documentation

## 2023-09-11 DIAGNOSIS — G629 Polyneuropathy, unspecified: Secondary | ICD-10-CM | POA: Diagnosis not present

## 2023-09-11 DIAGNOSIS — R5383 Other fatigue: Secondary | ICD-10-CM | POA: Diagnosis not present

## 2023-09-11 DIAGNOSIS — E860 Dehydration: Secondary | ICD-10-CM

## 2023-09-11 DIAGNOSIS — D472 Monoclonal gammopathy: Secondary | ICD-10-CM | POA: Insufficient documentation

## 2023-09-11 LAB — CMP (CANCER CENTER ONLY)
ALT: 16 U/L (ref 0–44)
AST: 21 U/L (ref 15–41)
Albumin: 2.8 g/dL — ABNORMAL LOW (ref 3.5–5.0)
Alkaline Phosphatase: 196 U/L — ABNORMAL HIGH (ref 38–126)
Anion gap: 3 — ABNORMAL LOW (ref 5–15)
BUN: 15 mg/dL (ref 8–23)
CO2: 23 mmol/L (ref 22–32)
Calcium: 8.1 mg/dL — ABNORMAL LOW (ref 8.9–10.3)
Chloride: 109 mmol/L (ref 98–111)
Creatinine: 1.43 mg/dL — ABNORMAL HIGH (ref 0.61–1.24)
GFR, Estimated: 54 mL/min — ABNORMAL LOW (ref >60)
Glucose, Bld: 127 mg/dL — ABNORMAL HIGH (ref 70–99)
Potassium: 4.1 mmol/L (ref 3.5–5.1)
Sodium: 135 mmol/L (ref 135–145)
Total Bilirubin: 0.7 mg/dL (ref <1.2)
Total Protein: 5.8 g/dL — ABNORMAL LOW (ref 6.5–8.1)

## 2023-09-11 LAB — CBC WITH DIFFERENTIAL (CANCER CENTER ONLY)
Abs Immature Granulocytes: 0.06 10*3/uL (ref 0.00–0.07)
Basophils Absolute: 0 10*3/uL (ref 0.0–0.1)
Basophils Relative: 0 %
Eosinophils Absolute: 0.1 10*3/uL (ref 0.0–0.5)
Eosinophils Relative: 2 %
HCT: 26.1 % — ABNORMAL LOW (ref 39.0–52.0)
Hemoglobin: 8.2 g/dL — ABNORMAL LOW (ref 13.0–17.0)
Immature Granulocytes: 1 %
Lymphocytes Relative: 10 %
Lymphs Abs: 0.5 10*3/uL — ABNORMAL LOW (ref 0.7–4.0)
MCH: 29.8 pg (ref 26.0–34.0)
MCHC: 31.4 g/dL (ref 30.0–36.0)
MCV: 94.9 fL (ref 80.0–100.0)
Monocytes Absolute: 0.5 10*3/uL (ref 0.1–1.0)
Monocytes Relative: 9 %
Neutro Abs: 4.2 10*3/uL (ref 1.7–7.7)
Neutrophils Relative %: 78 %
Platelet Count: 94 10*3/uL — ABNORMAL LOW (ref 150–400)
RBC: 2.75 MIL/uL — ABNORMAL LOW (ref 4.22–5.81)
RDW: 18.4 % — ABNORMAL HIGH (ref 11.5–15.5)
WBC Count: 5.4 10*3/uL (ref 4.0–10.5)
nRBC: 0 % (ref 0.0–0.2)

## 2023-09-11 LAB — MAGNESIUM: Magnesium: 1.6 mg/dL — ABNORMAL LOW (ref 1.7–2.4)

## 2023-09-11 MED ORDER — HEPARIN SOD (PORK) LOCK FLUSH 100 UNIT/ML IV SOLN
500.0000 [IU] | Freq: Once | INTRAVENOUS | Status: AC
Start: 2023-09-11 — End: 2023-09-11
  Administered 2023-09-11: 500 [IU] via INTRAVENOUS
  Filled 2023-09-11: qty 5

## 2023-09-11 MED ORDER — SODIUM CHLORIDE 0.9 % IV SOLN
INTRAVENOUS | Status: DC
  Filled 2023-09-11 (×2): qty 250

## 2023-09-11 MED ORDER — DEXAMETHASONE SODIUM PHOSPHATE 10 MG/ML IJ SOLN
10.0000 mg | Freq: Once | INTRAMUSCULAR | Status: AC
Start: 2023-09-11 — End: 2023-09-11
  Administered 2023-09-11: 10 mg via INTRAVENOUS
  Filled 2023-09-11: qty 1

## 2023-09-11 MED ORDER — SODIUM CHLORIDE 0.9% FLUSH
10.0000 mL | Freq: Once | INTRAVENOUS | Status: AC
Start: 2023-09-11 — End: 2023-09-11
  Administered 2023-09-11: 10 mL via INTRAVENOUS
  Filled 2023-09-11: qty 10

## 2023-09-11 NOTE — Progress Notes (Signed)
[facility]  [phone] [phone]) [phone]  ID: Chris George OB: 04-29-1956  MR#: [id number]  QIO#:[id number]  Patient Care Team: Marina Goodell, MD as PCP - General (Family Medicine) [name], MD as Consulting Physician (Oncology) Morene Crocker, MD as Referring Physician (Neurology) Lamar Blinks, MD as Consulting Physician (Cardiology)  CHIEF COMPLAINT: Recurrent colon cancer, smoldering myeloma, prostate cancer.  INTERVAL HISTORY: Patient returns to clinic today for further evaluation and consideration of cycle 8 of FOLFIRI. He feels poorly and doesn't want to take treatment today. He feels like he is having more bad days than good. Primarily generalized fatigue. He continues to have concerns with hemorrhoids and anal fissures. Eating and drinking normally. Denies abdominal pain. His expressive aphasia is stable.   REVIEW OF SYSTEMS:   Review of Systems  Constitutional:  Positive for malaise/fatigue. Negative for chills, fever and weight loss.  Respiratory:  Negative for cough, hemoptysis and shortness of breath.   Cardiovascular:  Negative for chest pain, palpitations and leg swelling.  Gastrointestinal:  Negative for blood in stool, constipation, diarrhea and melena.       Hemorrhoids  Genitourinary:  Negative for dysuria and hematuria.  Musculoskeletal:  Negative for back pain and joint pain.  Skin:  Negative for rash.  Neurological:  Positive for tingling, sensory change and weakness. Negative for dizziness, focal weakness and headaches.  Psychiatric/Behavioral:  Positive for depression. The patient is not nervous/anxious.   As per HPI. Otherwise, a complete review of systems is negative.   PAST MEDICAL HISTORY: Past Medical History:  Diagnosis Date   A-fib (HCC)    Anemia    Arthritis    ankles   Cancer (HCC)    multiple myeloma   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HCC) 10/26/2019   Cardiomyopathy (HCC)     Chemotherapy-induced peripheral neuropathy (HCC)    CHF (congestive heart failure) (HCC)    Depression    Dysrhythmia    atrial fib   History of CVA (cerebrovascular accident)    HLD (hyperlipidemia)    Hypercholesteremia    Hypertension    Moderate tricuspid insufficiency    Multiple myeloma (HCC)    not being treated right now per pt 11/11/19   Pneumonia    Sleep apnea    No CPAP.  OSA resolved with wt loss.    PAST SURGICAL HISTORY: Past Surgical History:  Procedure Laterality Date   ATRIAL ABLATION SURGERY     CARDIAC SURGERY     A-Fib Ablations   COLONOSCOPY WITH PROPOFOL N/A 10/26/2019   Procedure: COLONOSCOPY WITH BIOPSY;  Surgeon: [name], MD;  Location: [facility];  Service: Endoscopy;  Laterality: N/A;   COLONOSCOPY WITH PROPOFOL N/A 09/03/2022   Procedure: COLONOSCOPY WITH PROPOFOL;  Surgeon: [name], MD;  Location: [facility];  Service: Endoscopy;  Laterality: N/A;   ESOPHAGOGASTRODUODENOSCOPY (EGD) WITH PROPOFOL N/A 10/26/2019   Procedure: ESOPHAGOGASTRODUODENOSCOPY (EGD) WITH BIOPSY;  Surgeon: [name], MD;  Location: [facility];  Service: Endoscopy;  Laterality: N/A;  sleep apnea   LAPAROSCOPIC PARTIAL COLECTOMY Right 11/17/2019   Procedure: LAPAROSCOPIC PARTIAL COLECTOMY RIGHT EXTENDED;  Surgeon: [name], MD;  Location: [facility];  Service: General;  Laterality: Right;   PORTACATH PLACEMENT N/A 12/28/2019   Procedure: INSERTION PORT-A-CATH Left subclavian;  Surgeon: [name], MD;  Location: [facility];  Service: General;  Laterality: N/A;    FAMILY HISTORY: Family History  Problem Relation Age of Onset   Healthy Mother  ADVANCED DIRECTIVES (Y/N):  N   HEALTH MAINTENANCE: Social History   Tobacco Use   Smoking status: Never    Passive exposure: Never   Smokeless tobacco: Never  Vaping Use   Vaping status: Never Used  Substance Use Topics   Alcohol use: Not Currently   Drug use: Never      Colonoscopy:  PAP:  Bone density:  Lipid panel:  No Known Allergies  Current Outpatient Medications  Medication Sig Dispense Refill   allopurinol (ZYLOPRIM) 100 MG tablet Take 200 mg by mouth daily.     apixaban (ELIQUIS) 5 MG TABS tablet Take 5 mg by mouth 2 (two) times daily.     APPLE CIDER VINEGAR PO Take 30-45 mLs by mouth every other day.      atorvastatin (LIPITOR) 80 MG tablet Take 80 mg by mouth daily.     dicyclomine (BENTYL) 20 MG tablet Take by mouth.     gabapentin (NEURONTIN) 100 MG capsule Take 300 mg by mouth 2 (two) times daily.     HYDROcodone-acetaminophen (NORCO) 5-325 MG tablet Take 1 tablet by mouth every 6 (six) hours as needed for moderate pain. 30 tablet 0   hydrocortisone (ANUSOL-HC) 2.5 % rectal cream Place 1 Application rectally 2 (two) times daily. 30 g 0   hydrocortisone cream 1 % Apply 1 Application topically 2 (two) times daily.     Lidocaine, Anorectal, 5 % GEL Apply 1 Application topically as needed. 30 g 1   lidocaine-prilocaine (EMLA) cream Apply 1 Application topically daily.     MAGNESIUM PO Take by mouth.     mirtazapine (REMERON) 7.5 MG tablet Take 1 tablet (7.5 mg total) by mouth at bedtime. 30 tablet 3   Misc Natural Products (TART CHERRY ADVANCED PO) Take 60 mLs by mouth every other day.     Multiple Vitamins-Minerals (ZINC PO) Take by mouth.     nifedipine 0.3 % ointment Place 1 Application rectally 4 (four) times daily. with Lidocaine 1.5% 30 g 0   potassium bicarbonate (K-LYTE) 25 MEQ disintegrating tablet Take 25 mEq by mouth 2 (two) times daily. In the afternoon.     tamsulosin (FLOMAX) 0.4 MG CAPS capsule Take 1 capsule (0.4 mg total) by mouth daily after supper. 30 capsule 6   Zinc Sulfate (ZINC 15 PO) Take by mouth.     colchicine 0.6 MG tablet Take 1 tablet (0.6 mg total) by mouth 2 (two) times daily. Until flare resolves. (Patient not taking: Reported on 09/03/2023) 20 tablet 1   furosemide (LASIX) 80 MG tablet Take 40 mg by  mouth daily as needed for edema. (Patient not taking: Reported on 09/03/2023)     loperamide (IMODIUM A-D) 2 MG tablet Take 2 mg by mouth 4 (four) times daily as needed for diarrhea or loose stools. (Patient not taking: Reported on 09/03/2023)     prochlorperazine (COMPAZINE) 10 MG tablet Take 1 tablet (10 mg total) by mouth every 6 (six) hours as needed for nausea or vomiting. (Patient not taking: Reported on 09/03/2023) 60 tablet 1   tadalafil (CIALIS) 10 MG tablet Take 1-2 tablets (10-20 mg total) by mouth daily as needed for erectile dysfunction (take 45 minutes prior to secual activity). (Patient not taking: Reported on 09/03/2023) 30 tablet 6   No current facility-administered medications for this visit.   Facility-Administered Medications Ordered in Other Visits  Medication Dose Route Frequency Provider Last Rate Last Admin   heparin lock flush 100 unit/mL  500 Units Intravenous Once [place],  Tollie Pizza, MD       heparin lock flush 100 unit/mL  500 Units Intravenous Once [name], MD        OBJECTIVE: Vitals:   09/11/23 0852  BP: 104/68  Pulse: (!) 101  Temp: 97.8 F (36.6 C)  SpO2: 99%       Body mass index is 31.9 kg/m.    ECOG FS:3 - Symptomatic, >50% confined to bed  General: fatigued appearing.  Eyes: Pink conjunctiva, anicteric sclera. Lungs: Clear to auscultation bilaterally.  No audible wheezing or coughing Heart: Regular rate and rhythm.  Abdomen: Soft, nontender, nondistended.  Musculoskeletal: No edema, cyanosis, or clubbing. Neuro: Alert, answering all questions appropriately. Cranial nerves grossly intact. Skin: No rashes or petechiae noted. Psych: Normal affect.   LAB RESULTS:  Lab Results  Component Value Date   NA 135 09/11/2023   K 4.1 09/11/2023   CL 109 09/11/2023   CO2 23 09/11/2023   GLUCOSE 127 (H) 09/11/2023   BUN 15 09/11/2023   CREATININE 1.43 (H) 09/11/2023   CALCIUM 8.1 (L) 09/11/2023   PROT 5.8 (L) 09/11/2023   ALBUMIN  2.8 (L) 09/11/2023   AST 21 09/11/2023   ALT 16 09/11/2023   ALKPHOS 196 (H) 09/11/2023   BILITOT 0.7 09/11/2023   GFRNONAA 54 (L) 09/11/2023   GFRAA >60 07/29/2020   Lab Results  Component Value Date   WBC 5.4 09/11/2023   NEUTROABS 4.2 09/11/2023   HGB 8.2 (L) 09/11/2023   HCT 26.1 (L) 09/11/2023   MCV 94.9 09/11/2023   PLT 94 (L) 09/11/2023   Lab Results  Component Value Date   IRON 79 08/13/2023   TIBC 234 (L) 08/13/2023   IRONPCTSAT 34 08/13/2023   Lab Results  Component Value Date   FERRITIN 686 (H) 09/03/2023     STUDIES: NM PET Image Restag (PS) Skull Base To Thigh  Result Date: 08/15/2023 CLINICAL DATA:  Subsequent treatment strategy for colon cancer. History of prostate cancer, multiple myeloma, and lymphoma. EXAM: NUCLEAR MEDICINE PET SKULL BASE TO THIGH TECHNIQUE: 10.9 mCi F-18 FDG was injected intravenously. Full-ring PET imaging was performed from the skull base to thigh after the radiotracer. CT data was obtained and used for attenuation correction and anatomic localization. Fasting blood glucose: 92 mg/dl COMPARISON:  Pylarify PET-CT dated 03/21/2023. FDG PET-CT dated 03/12/2023. FINDINGS: Mediastinal blood pool activity: SUV max 2.6 Liver activity: SUV max NA NECK: No hypermetabolic lymph nodes in the neck. Incidental CT findings: None. CHEST: No hypermetabolic mediastinal or hilar nodes. No suspicious pulmonary nodules on the CT scan. Left chest port terminates in the lower SVC. Incidental CT findings: Cardiomegaly. Hypodense blood pool relative to myocardium, suggesting anemia. ABDOMEN/PELVIS: Status post right hemicolectomy. No abnormal hypermetabolism in the liver, spleen, pancreas, or adrenal glands. 13 mm short axis aggregate retrocaval node (series 6/image 105), max SUV 5.8, previously 14.0. Incidental CT findings: Mildly nodular hepatic contour. Spleen is top-normal in size. Mild atherosclerotic calcifications of the abdominal aorta and branch vessels.  SKELETON: Diffuse hypermetabolism in the visualized axial and appendicular skeleton, favored to reflect posttreatment changes. Incidental CT findings: Mild degenerative changes of the visualized thoracolumbar spine. IMPRESSION: Status post right hemicolectomy. Improving retroperitoneal nodal metastasis, as above. Diffuse osseous hypermetabolism, favoring post treatment changes. Electronically Signed   By: Charline Bills M.D.   On: 08/15/2023 00:03    ONCOLOGY HISTORY: Patient completed cycle 9 of adjuvant FOLFOX on June 03, 2020.  Given his difficulties with treatment and declining performance status,  treatment was discontinued altogether. Colonoscopy on September 03, 2022 did not reveal any significant pathology and recommendation was to repeat in 3 years.    ASSESSMENT: Stage IIIc colon cancer, smoldering myeloma, prostate cancer.  PLAN:    Stage IIIc colon cancer:  CT scan from December 07, 2022 with enlarging aortocaval node now measuring 1.9 x 1.4 cm.  PET scan results from Mar 15, 2023 with progressive retroperitoneal lymphadenopathy presumably related to patient's colon cancer.  Patient CEA trended up and peaked at 194, at lowest was 60.7 (08/07/23) and has been uptrending more recently. Imaging on 08/15/23 revealed significant improvement in his disease burden. He is currently s/p cycle 7 of dose reduced 10% d/t renal insufficiency and baseline pancytopenia) FOLFOX. Avastin has been held given recent stroke. Today's cea is pending. Clinically, he feels poorly and wishes to delay treatment. He questions spacing treatments out d/t his symptoms and potential effect on disease. I would defer to Dr. Orlie Dakin. He prefers to hold treatment until 09/24/23. Plan for port/labs, see Dr Orlie Dakin with possible treatment that day. Would plan to return to clinic 2 days after for pump removal. Plan to reimage at conclusion of cycle 12 or sooner if worsening symptoms or rising cea.   Prostate cancer: Gleason's 3+4.   Patient's PSA increased and peaked at 39.34, but since completing treatment has decreased and is now 8.26 (08/07/23).  Nuclear medicine bone scan on December 13, 2022 was reported as negative.  PSMA PET per radiation oncology from Mar 21, 2023 did not reveal metastatic disease.  Patient completed XRT for local control disease on May 30, 2023.  Repeat imaging as above.  Patient saw urology earlier today.  Appreciate their input.  Smoldering myeloma: Chronic and unchanged.  Patient was initially diagnosed and treated in [place], [place] and treated with single agent Velcade in approximately 2013.  He declined maintenance treatment or referral for bone marrow transplant.  His M spike has ranged from 1.1-2.0 since July 2020.  His most recent result was 1.6.  Over the same timeframe his IgG component has ranged from [phone].  His most recent result is 2344.  Finally, his kappa free light chains have ranged from 24.8 -75.3 with his most recent result reported at 36.2.  Nuclear med bone scan as above.  His most recent bone marrow biopsy completed on May 28, 2019 revealed only 10 to 15% plasma cells with no clonality reported.  Cytogenetics were also reported as normal. Continue to monitor closely.  Anemia: Hemoglobin improved to 8.2 with transfusion. His baseline appears to be around 8 so unlikely that anemia is significantly contributing to his fatigue. Hold transfusion.  Neutropenia: Resolved.  Continue Udenyca with pump removal.  Dose reduced FOLFIRI as above. Thrombocytopenia: Platelets down. Now 94. Hold treatment as above.  Renal insufficiency: Creatinine 1.43. Improved. Neither irinotecan or 5-FU need to be dose reduced in the setting of renal insufficiency. Patient reports he has not seen nephrology in several years.  If his creatinine continues to remain elevated, will give a referral back. Hypomagnesia: Magnesium decreased, but stable at 1.6.  Monitor. Peripheral neuropathy: Chronic and unchanged.   Patient reports gabapentin did not help.  He was recently initiated on Lyrica 50 mg daily and given a referral to neurology.   Hypotension: Improved.   Hypocalcemia- Ca 8.1. Encouraged calcium 1200 mg and vitamin D 1000 mcg daily.  Fatigue- etiology unclear but albumin is down to 2.8. On remeron.  Coping/anxiety: Patient was previously given a referral to  [facility]. Goals of care- treatments given with palliative intent. Patient is followed by palliative care. Will add visit with PC at next clinic visit.   Disposition:  Hold treatment today. Proceed with IVF and decadron 10 mg RTC on 09/22/18 (patient request) for port/lab, Dr Orlie Dakin, +/- cycle 8 FOLFIRI. Pump off 2 days later with fulphila- la  Patient expressed understanding and was in agreement with this plan. He also understands that He can call clinic at any time with any questions, concerns, or complaints.   Alinda Dooms, NP   09/11/2023   CC: Dr. Orlie Dakin

## 2023-09-12 ENCOUNTER — Ambulatory Visit: Payer: Medicare HMO

## 2023-09-12 ENCOUNTER — Other Ambulatory Visit: Payer: Self-pay | Admitting: Oncology

## 2023-09-12 LAB — CEA: CEA: 99.5 ng/mL — ABNORMAL HIGH (ref 0.0–4.7)

## 2023-09-17 ENCOUNTER — Ambulatory Visit: Payer: Medicare HMO

## 2023-09-18 ENCOUNTER — Other Ambulatory Visit: Payer: Medicare HMO

## 2023-09-18 ENCOUNTER — Other Ambulatory Visit: Payer: Self-pay

## 2023-09-18 ENCOUNTER — Ambulatory Visit
Admission: EM | Admit: 2023-09-18 | Discharge: 2023-09-18 | Discharge status: Against Medical Advice | Payer: Medicare HMO | Attending: Physician Assistant | Admitting: Physician Assistant

## 2023-09-18 ENCOUNTER — Ambulatory Visit: Payer: Medicare HMO | Admitting: Oncology

## 2023-09-18 ENCOUNTER — Encounter: Payer: Self-pay | Admitting: Emergency Medicine

## 2023-09-18 ENCOUNTER — Ambulatory Visit: Payer: Medicare HMO

## 2023-09-18 DIAGNOSIS — H1132 Conjunctival hemorrhage, left eye: Secondary | ICD-10-CM | POA: Diagnosis not present

## 2023-09-18 DIAGNOSIS — W19XXXA Unspecified fall, initial encounter: Secondary | ICD-10-CM

## 2023-09-18 DIAGNOSIS — Z8673 Personal history of transient ischemic attack (TIA), and cerebral infarction without residual deficits: Secondary | ICD-10-CM

## 2023-09-18 DIAGNOSIS — R600 Localized edema: Secondary | ICD-10-CM

## 2023-09-18 DIAGNOSIS — I499 Cardiac arrhythmia, unspecified: Secondary | ICD-10-CM | POA: Diagnosis not present

## 2023-09-18 DIAGNOSIS — Z8679 Personal history of other diseases of the circulatory system: Secondary | ICD-10-CM | POA: Diagnosis not present

## 2023-09-18 DIAGNOSIS — I4811 Longstanding persistent atrial fibrillation: Secondary | ICD-10-CM

## 2023-09-18 DIAGNOSIS — Z7901 Long term (current) use of anticoagulants: Secondary | ICD-10-CM

## 2023-09-18 NOTE — ED Triage Notes (Addendum)
Pt was leaning into his closet and tripped over his laundry bag fell. Pt states he did not hit the floor but hit the laundry bag. He has pain in his left eye and redness.  He also c/o bilateral leg swelling x 5 weeks since stopping Lasix.

## 2023-09-18 NOTE — Discharge Instructions (Addendum)
You have been advised to follow up immediately in the emergency department for concerning signs.symptoms. If you declined EMS transport, please have a family member take you directly to the ED at this time. Do not delay. Based on concerns about condition, if you do not follow up in th e ED, you may risk poor outcomes including worsening of condition, delayed treatment and potentially life threatening issues. If you have declined to go to the ED at this time, you should call your PCP immediately to set up a follow up appointment.  Go to ED for red flag symptoms, including; fevers you cannot reduce with Tylenol/Motrin, severe headaches, vision changes, numbness/weakness in part of the body, lethargy, confusion, intractable vomiting, severe dehydration, chest pain, breathing difficulty, severe persistent abdominal or pelvic pain, signs of severe infection (increased redness, swelling of an area), feeling faint or passing out, dizziness, etc. You should especially go to the ED for sudden acute worsening of condition if you do not elect to go at this time.   -I have advised to go to the emergency department for further evaluation to include CT scan, labs to check your heart function and IV medication to help remove the fluid from your legs. - You state that you understand the risks of not going to the ER tonight including if you do have a brain bleed it could potentially worsen and you could pass out with no way to get to the emergency department. -You do not go to the ER please contact your cancer doctor tomorrow.  Take the Lasix tonight.  Call 911 if headache, dizziness, vomiting, worsening symptoms of any sort.

## 2023-09-18 NOTE — ED Notes (Signed)
Patient is being discharged from the Urgent Care and sent to the Emergency Department via personal vehicle . Per Cathe Mons PA, patient is in need of higher level of care due to peripheral edema, fall, left eye trauma. Patient is aware and verbalizes understanding of plan of care.  Vitals:   09/18/23 1548 09/18/23 1555  BP: 108/68   Pulse: (!) 118 60  Temp:  (!) 97.3 F (36.3 C)  SpO2: 100%

## 2023-09-18 NOTE — ED Provider Notes (Signed)
[facility]    CSN: [id number] Arrival date & time: 09/18/23  1532      History   Chief Complaint Chief Complaint  Patient presents with   Fall   Leg Swelling    HPI Chris George is a 67 y.o. male presenting for evaluation after accidental fall that occurred about 6-7 hours ago.  Patient says he was trying to hang something up when he lost his balance and fell.  He reports hitting the left side of his face and eye on a laundry bag.  He denies falling onto the floor.  He denies loss of consciousness.  Noticed redness of the left eye immediately--it has improved. No eye pain or visual disturbance.  Denies headaches or neck pain.  No facial numbness/drooping, extremity weakness/numbness, gait instability, confusion, nausea/vomiting.  Also reports bilateral leg swelling for the past few weeks after Lasix was discontinued. Patient says he was advised by his oncologist to stop the Lasix due to declining kidney function. Next appointment is on 09/24/23.  Patient states that he has persistent atrial fibrillation.   Medical history significant for prostate cancer, colon cancer, multiple myeloma, CVA, a-fib, chemo induced peripheral neuropathy, kidney disease.  HPI  Past Medical History:  Diagnosis Date   A-fib (HCC)    Anemia    Arthritis    ankles   Cancer (HCC)    multiple myeloma   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HCC) 10/26/2019   Cardiomyopathy (HCC)    Chemotherapy-induced peripheral neuropathy (HCC)    CHF (congestive heart failure) (HCC)    Depression    Dysrhythmia    atrial fib   History of CVA (cerebrovascular accident)    HLD (hyperlipidemia)    Hypercholesteremia    Hypertension    Moderate tricuspid insufficiency    Multiple myeloma (HCC)    not being treated right now per pt 11/11/19   Pneumonia    Sleep apnea    No CPAP.  OSA resolved with wt loss.    Patient Active Problem List   Diagnosis Date Noted   HFrEF (heart failure with reduced  ejection fraction) (HCC) 06/12/2023   Drug-induced polyneuropathy (HCC) 04/22/2023   Hepatic cirrhosis (HCC) 04/22/2023   Major depressive disorder, single episode, mild (HCC) 04/22/2023   Malignant neoplasm of prostate (HCC) 04/22/2023   Secondary hyperparathyroidism of renal origin (HCC) 04/22/2023   History of CVA (cerebrovascular accident) 04/05/2023   Personal history of colon cancer    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   Diabetes mellitus type 2, controlled, without complications (HCC) 08/07/2021   Neuropathy 08/30/2020   Port-A-Cath in place    Abnormal ECG 11/04/2019   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HCC) 10/27/2019   Goals of care, counseling/discussion 05/18/2019   Multiple myeloma (HCC) 05/17/2019   Anemia 05/12/2019   Arthritis of left ankle 02/05/2018   Cardiomyopathy, idiopathic (HCC) 01/07/2018   Moderate tricuspid insufficiency 01/01/2017   OSA (obstructive sleep apnea) 01/01/2017   Chronic a-fib (HCC) 12/06/2015   Pure hypercholesterolemia 02/09/2015   Anticoagulant long-term use 04/29/2014   Hyperlipidemia 04/29/2014   Hypertension, essential, benign 04/26/2014    Past Surgical History:  Procedure Laterality Date   ATRIAL ABLATION SURGERY     CARDIAC SURGERY     A-Fib Ablations   COLONOSCOPY WITH PROPOFOL N/A 10/26/2019   Procedure: COLONOSCOPY WITH BIOPSY;  Surgeon: [name], MD;  Location: [facility];  Service: Endoscopy;  Laterality: N/A;   COLONOSCOPY WITH PROPOFOL N/A 09/03/2022   Procedure: COLONOSCOPY WITH  PROPOFOL;  Surgeon: [name], MD;  Location: [facility];  Service: Endoscopy;  Laterality: N/A;   ESOPHAGOGASTRODUODENOSCOPY (EGD) WITH PROPOFOL N/A 10/26/2019   Procedure: ESOPHAGOGASTRODUODENOSCOPY (EGD) WITH BIOPSY;  Surgeon: [name], MD;  Location: [facility];  Service: Endoscopy;  Laterality: N/A;  sleep apnea   LAPAROSCOPIC PARTIAL COLECTOMY Right 11/17/2019   Procedure: LAPAROSCOPIC  PARTIAL COLECTOMY RIGHT EXTENDED;  Surgeon: [name], MD;  Location: [facility];  Service: General;  Laterality: Right;   PORTACATH PLACEMENT N/A 12/28/2019   Procedure: INSERTION PORT-A-CATH Left subclavian;  Surgeon: [name], MD;  Location: [facility];  Service: General;  Laterality: N/A;       Home Medications    Prior to Admission medications   Medication Sig Start Date End Date Taking? Authorizing Provider  allopurinol (ZYLOPRIM) 100 MG tablet Take 200 mg by mouth daily. 11/01/22 11/01/23  [provider]  apixaban (ELIQUIS) 5 MG TABS tablet Take 5 mg by mouth 2 (two) times daily.    [provider]  APPLE CIDER VINEGAR PO Take 30-45 mLs by mouth every other day.     [provider]  atorvastatin (LIPITOR) 80 MG tablet Take 80 mg by mouth daily.    [provider]  colchicine 0.6 MG tablet Take 1 tablet (0.6 mg total) by mouth 2 (two) times daily. Until flare resolves. Patient not taking: Reported on 09/03/2023 04/22/23   Shirlee Latch, PA-C  dicyclomine (BENTYL) 20 MG tablet Take by mouth. 07/26/23 07/25/24  [provider]  furosemide (LASIX) 80 MG tablet Take 40 mg by mouth daily as needed for edema. Patient not taking: Reported on 09/03/2023    [provider]  gabapentin (NEURONTIN) 100 MG capsule Take 300 mg by mouth 2 (two) times daily. 06/05/23   [provider]  HYDROcodone-acetaminophen (NORCO) 5-325 MG tablet Take 1 tablet by mouth every 6 (six) hours as needed for moderate pain. 06/28/23   Rushie Chestnut, PA-C  hydrocortisone (ANUSOL-HC) 2.5 % rectal cream Place 1 Application rectally 2 (two) times daily. 06/28/23   Rushie Chestnut, PA-C  hydrocortisone cream 1 % Apply 1 Application topically 2 (two) times daily.    [provider]  Lidocaine, Anorectal, 5 % GEL Apply 1 Application topically as needed. 07/23/23   Pilar Jarvis, MD  lidocaine-prilocaine (EMLA) cream Apply 1 Application topically  daily.    [provider]  loperamide (IMODIUM A-D) 2 MG tablet Take 2 mg by mouth 4 (four) times daily as needed for diarrhea or loose stools. Patient not taking: Reported on 09/03/2023    [provider]  MAGNESIUM PO Take by mouth.    [provider]  mirtazapine (REMERON) 7.5 MG tablet Take 1 tablet (7.5 mg total) by mouth at bedtime. 08/13/23   Borders, Daryl Eastern, NP  Misc Natural Products (TART CHERRY ADVANCED PO) Take 60 mLs by mouth every other day.    [provider]  Multiple Vitamins-Minerals (ZINC PO) Take by mouth.    [provider]  nifedipine 0.3 % ointment Place 1 Application rectally 4 (four) times daily. with Lidocaine 1.5% 08/13/23   [name], MD  potassium bicarbonate (K-LYTE) 25 MEQ disintegrating tablet Take 25 mEq by mouth 2 (two) times daily. In the afternoon.    [provider]  prochlorperazine (COMPAZINE) 10 MG tablet Take 1 tablet (10 mg total) by mouth every 6 (six) hours as needed for nausea or vomiting. Patient not taking: Reported on 09/03/2023 04/23/23  [name], MD  tadalafil (CIALIS) 10 MG tablet Take 1-2 tablets (10-20 mg total) by mouth daily as needed for erectile dysfunction (take 45 minutes prior to secual activity). Patient not taking: Reported on 09/03/2023 08/28/23   Sondra Come, MD  tamsulosin (FLOMAX) 0.4 MG CAPS capsule Take 1 capsule (0.4 mg total) by mouth daily after supper. 05/21/23   Carmina Miller, MD  Zinc Sulfate (ZINC 15 PO) Take by mouth.    [provider]    Family History Family History  Problem Relation Age of Onset   Healthy Mother     Social History Social History   Tobacco Use   Smoking status: Never    Passive exposure: Never   Smokeless tobacco: Never  Vaping Use   Vaping status: Never Used  Substance Use Topics   Alcohol use: Not Currently   Drug use: Never     Allergies   Patient has no known allergies.   Review of  Systems Review of Systems  Constitutional:  Positive for fatigue.  Eyes:  Positive for redness. Negative for photophobia, pain, discharge and visual disturbance.  Respiratory:  Negative for chest tightness, shortness of breath and wheezing.   Cardiovascular:  Positive for leg swelling. Negative for chest pain and palpitations.  Gastrointestinal:  Negative for abdominal pain, nausea and vomiting.  Skin:  Negative for wound.  Neurological:  Negative for dizziness, syncope, facial asymmetry, weakness, numbness and headaches.     Physical Exam Triage Vital Signs ED Triage Vitals  Encounter Vitals Group     BP 09/18/23 1548 108/68     Systolic BP Percentile --      Diastolic BP Percentile --      Pulse Rate 09/18/23 1548 (!) 118     Resp --      Temp 09/18/23 1555 (!) 97.3 F (36.3 C)     Temp Source 09/18/23 1555 Tympanic     SpO2 09/18/23 1548 100 %     Weight --      Height --      Head Circumference --      Peak Flow --      Pain Score --      Pain Loc --      Pain Education --      Exclude from Growth Chart --    No data found.  Updated Vital Signs BP 108/68 (BP Location: Left Arm)   Pulse 60 Comment: irregular  Temp (!) 97.3 F (36.3 C) (Tympanic)   SpO2 100%   Visual Acuity Right Eye Distance: 20/40 (corrected) Left Eye Distance: 20/40 (corrected) Bilateral Distance: 20/30 (corrected)  Physical Exam Vitals and nursing note reviewed.  Constitutional:      General: He is not in acute distress.    Appearance: Normal appearance. He is well-developed. He is not ill-appearing.  HENT:     Head: Normocephalic and atraumatic.     Right Ear: Tympanic membrane, ear canal and external ear normal.     Left Ear: Tympanic membrane, ear canal and external ear normal.     Nose: Nose normal.     Mouth/Throat:     Mouth: Mucous membranes are moist.     Pharynx: Oropharynx is clear.  Eyes:     General: No scleral icterus.    Conjunctiva/sclera:     Left eye: Hemorrhage  (small subconjunctival hemorrhage of left eye) present.     Pupils: Pupils are equal, round, and reactive to light.  Cardiovascular:  Rate and Rhythm: Normal rate. Rhythm irregular.     Pulses: Normal pulses.  Pulmonary:     Effort: Pulmonary effort is normal. No respiratory distress.     Breath sounds: Normal breath sounds.  Abdominal:     Palpations: Abdomen is soft.     Tenderness: There is no abdominal tenderness.  Musculoskeletal:     Cervical back: Neck supple.     Right lower leg: Edema (1+ pitting edema to proximal tibia) present.     Left lower leg: Edema (1+ pitting edema to proximal tibia) present.  Skin:    General: Skin is warm and dry.     Capillary Refill: Capillary refill takes less than 2 seconds.  Neurological:     General: No focal deficit present.     Mental Status: He is alert and oriented to person, place, and time. Mental status is at baseline.     Cranial Nerves: No cranial nerve deficit.     Motor: No weakness.     Coordination: Coordination normal.     Gait: Gait normal.  Psychiatric:        Mood and Affect: Mood normal.        Behavior: Behavior normal.      UC Treatments / Results  Labs (all labs ordered are listed, but only abnormal results are displayed) Labs Reviewed - No data to display  EKG   Radiology No results found.  Procedures ED EKG  Date/Time: 09/18/2023 4:22 PM  Performed by: Shirlee Latch, PA-C Authorized by: Shirlee Latch, PA-C   Interpretation:    Interpretation: abnormal   Rate:    ECG rate:  86   ECG rate assessment: normal   Rhythm:    Rhythm: atrial fibrillation   QRS:    QRS axis:  Normal   QRS intervals:  Normal   QRS conduction: RBBB   Comments:     Atrial fibrillation. RBBB  (including critical care time)  Medications Ordered in UC Medications - No data to display  Initial Impression / Assessment and Plan / UC Course  I have reviewed the triage vital signs and the nursing  notes.  Pertinent labs & imaging results that were available during my care of the patient were reviewed by me and considered in my medical decision making (see chart for details).   67 y/o male presents for for evaluation after accidental fall 6-7 hours ago.   EKG show atrial fibrillation.     *** Final Clinical Impressions(s) / UC Diagnoses   Final diagnoses:  Peripheral edema  Longstanding persistent atrial fibrillation (HCC)  Fall, initial encounter  Subconjunctival hemorrhage of left eye  Long term current use of anticoagulant therapy     Discharge Instructions      You have been advised to follow up immediately in the emergency department for concerning signs.symptoms. If you declined EMS transport, please have a family member take you directly to the ED at this time. Do not delay. Based on concerns about condition, if you do not follow up in th e ED, you may risk poor outcomes including worsening of condition, delayed treatment and potentially life threatening issues. If you have declined to go to the ED at this time, you should call your PCP immediately to set up a follow up appointment.  Go to ED for red flag symptoms, including; fevers you cannot reduce with Tylenol/Motrin, severe headaches, vision changes, numbness/weakness in part of the body, lethargy, confusion, intractable vomiting, severe dehydration, chest pain,  breathing difficulty, severe persistent abdominal or pelvic pain, signs of severe infection (increased redness, swelling of an area), feeling faint or passing out, dizziness, etc. You should especially go to the ED for sudden acute worsening of condition if you do not elect to go at this time.   -I have advised to go to the emergency department for further evaluation to include CT scan, labs to check your heart function and IV medication to help remove the fluid from your legs. - You state that you understand the risks of not going to the ER tonight including if  you do have a brain bleed it could potentially worsen and you could pass out with no way to get to the emergency department. -You do not go to the ER please contact your cancer doctor tomorrow.  Take the Lasix tonight.  Call 911 if headache, dizziness, vomiting, worsening symptoms of any sort.     ED Prescriptions   None    PDMP not reviewed this encounter.

## 2023-09-19 ENCOUNTER — Ambulatory Visit: Payer: Medicare HMO

## 2023-09-19 ENCOUNTER — Telehealth: Payer: Self-pay

## 2023-09-19 NOTE — Telephone Encounter (Signed)
Attempted to contact pt as he did not show to his scheduled appointment. Not successful at this time.   .sgin

## 2023-09-20 ENCOUNTER — Telehealth: Payer: Self-pay | Admitting: *Deleted

## 2023-09-20 ENCOUNTER — Other Ambulatory Visit: Payer: Self-pay | Admitting: *Deleted

## 2023-09-20 DIAGNOSIS — C184 Malignant neoplasm of transverse colon: Secondary | ICD-10-CM

## 2023-09-20 NOTE — Telephone Encounter (Addendum)
He is very upset that he is not to take Lasix and wants to know what else can be done, the compression socks and elevation is not working. I discussed with him regarding his kidneys and he said that something else has to be available for him and not to have to suffer  for another 3 days with this and said he will see what how he feels and did not agree with not taking the lasix. He said he will siee [place] Tuesday. He did have labs on 11/6 CMP (Cancer Center only) Order: [id number] Status: Final result     Visible to patient: Yes (not seen)     Next appt: 09/24/2023 at 08:15 AM in Oncology ([facility])     Dx: 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HCC)   0 Result Notes     1 HM Topic          Component Ref Range & Units 9 d ago (09/11/23) 2 wk ago (09/03/23) 3 wk ago (08/28/23) 1 mo ago (08/21/23) 1 mo ago (08/13/23) 1 mo ago (08/07/23) 1 mo ago (07/31/23)  Sodium 135 - 145 mmol/L 135 135 133 Low  134 Low  131 Low  135 133 Low   Potassium 3.5 - 5.1 mmol/L 4.1 4.7 4.4 4.2 4.8 4.4 4.3  Chloride 98 - 111 mmol/L 109 109 108 109 106 108 108  CO2 22 - 32 mmol/L 23 21 Low  22 21 Low  20 Low  21 Low  21 Low   Glucose, Bld 70 - 99 mg/dL 782 High  956 High  CM 115 High  CM 118 High  CM 161 High  CM 125 High  CM 106 High  CM  Comment: Glucose reference range applies only to samples taken after fasting for at least 8 hours.  BUN 8 - 23 mg/dL 15 25 High  22 21 22 17 17   Creatinine 0.61 - 1.24 mg/dL 2.13 High  0.86 High  5.78 High  1.58 High  1.73 High  1.96 High  2.87 High   Calcium 8.9 - 10.3 mg/dL 8.1 Low  8.2 Low  8.1 Low  8.0 Low  8.2 Low  8.2 Low  8.2 Low   Total Protein 6.5 - 8.1 g/dL 5.8 Low  6.1 Low  6.5 6.6 6.4 Low  6.4 Low  6.4 Low   Albumin 3.5 - 5.0 g/dL 2.8 Low  2.9 Low  3.1 Low  3.0 Low  3.0 Low  2.9 Low  3.0 Low   AST 15 - 41 U/L 21 20 27 26  35 29 20  ALT 0 - 44 U/L 16 15 20 24 29 20 16   Alkaline Phosphatase 38 - 126 U/L 196 High  190 High  197 High  208 High  222 High  186 High   170 High   Total Bilirubin <1.2 mg/dL 0.7 1.1 R 0.6 R 0.6 R 0.9 R 0.7 R 0.8 R  GFR, Estimated >60 mL/min 54 Low  57 Low  CM 51 Low  CM 48 Low  CM 43 Low  CM 37 Low  CM 23 Low  CM  Comment: (NOTE) Calculated using the CKD-EPI Creatinine Equation (2021)  Anion gap 5 - 15 3 Low  5 CM 3 Low  CM 4 Low  CM 5 CM 6 CM 4 Low  CM  Comment: Performed at [facility], [address] [place], [place] [address]  Resulting Agency [facility]  [facility]         Specimen Collected: 09/11/23 08:38 Last Resulted: 09/11/23 09:51

## 2023-09-20 NOTE — Telephone Encounter (Signed)
Per charting, pt went to Urgent Care s/p accidentally fall. Lasix was previously due to renal function per chart.  He was advised by Urgent care to be evaluated in the ER for the significant peripheral edema and that he needed IV lasix. Urgent care also noted concerns about falls given that he is on anticoagulants.   Per chart- "Advised him to take Lasix when he gets home to help with the fluid, elevate legs and use compression stockings. Advised to call EMS if he has headaches, vision changes, increased confusion, weakness, another fall, vomiting, chest pain, shortness of breath, etc. He is agreeable. Patient agrees to leave AMA since he declined to go to the emergency department at this time."

## 2023-09-20 NOTE — Telephone Encounter (Signed)
Patient accepts Monday appointment at 930 AM

## 2023-09-20 NOTE — Telephone Encounter (Signed)
Patient called reporting that his leg swelling and now his feet swelling is getting worse. He states he was told to stop a medicine for it but not sure of name and now it is painful and he has difficulty even getting his shoes on and it is painful if her puts them on. He reports that he went to [facility] for this and told him that he needs to go back on the medicine for the swelling. He is asking what he can do and asking if he needs to come in for this. Please advise

## 2023-09-20 NOTE — Telephone Encounter (Signed)
Patient called back again and reports tat he did start back on the lasix yesterday and that it is no better but no worse at this time after 2 doses of  80 mg lasix. He did note that the pain was less today in his feet.

## 2023-09-23 ENCOUNTER — Encounter: Payer: Self-pay | Admitting: Oncology

## 2023-09-23 ENCOUNTER — Other Ambulatory Visit: Payer: Self-pay

## 2023-09-23 ENCOUNTER — Inpatient Hospital Stay: Payer: Medicare HMO

## 2023-09-23 ENCOUNTER — Encounter: Payer: Self-pay | Admitting: Hospice and Palliative Medicine

## 2023-09-23 ENCOUNTER — Inpatient Hospital Stay (HOSPITAL_BASED_OUTPATIENT_CLINIC_OR_DEPARTMENT_OTHER): Payer: Medicare HMO | Admitting: Hospice and Palliative Medicine

## 2023-09-23 VITALS — BP 97/67 | HR 67 | Temp 97.9°F | Resp 22 | Ht 69.0 in | Wt 223.0 lb

## 2023-09-23 DIAGNOSIS — C184 Malignant neoplasm of transverse colon: Secondary | ICD-10-CM | POA: Diagnosis not present

## 2023-09-23 DIAGNOSIS — Z5111 Encounter for antineoplastic chemotherapy: Secondary | ICD-10-CM | POA: Diagnosis not present

## 2023-09-23 DIAGNOSIS — R609 Edema, unspecified: Secondary | ICD-10-CM

## 2023-09-23 DIAGNOSIS — Z95828 Presence of other vascular implants and grafts: Secondary | ICD-10-CM

## 2023-09-23 LAB — CBC WITH DIFFERENTIAL (CANCER CENTER ONLY)
Abs Immature Granulocytes: 0 10*3/uL (ref 0.00–0.07)
Basophils Absolute: 0 10*3/uL (ref 0.0–0.1)
Basophils Relative: 0 %
Eosinophils Absolute: 0.1 10*3/uL (ref 0.0–0.5)
Eosinophils Relative: 2 %
HCT: 25.5 % — ABNORMAL LOW (ref 39.0–52.0)
Hemoglobin: 7.9 g/dL — ABNORMAL LOW (ref 13.0–17.0)
Immature Granulocytes: 0 %
Lymphocytes Relative: 10 %
Lymphs Abs: 0.3 10*3/uL — ABNORMAL LOW (ref 0.7–4.0)
MCH: 29.6 pg (ref 26.0–34.0)
MCHC: 31 g/dL (ref 30.0–36.0)
MCV: 95.5 fL (ref 80.0–100.0)
Monocytes Absolute: 0.7 10*3/uL (ref 0.1–1.0)
Monocytes Relative: 20 %
Neutro Abs: 2.4 10*3/uL (ref 1.7–7.7)
Neutrophils Relative %: 68 %
Platelet Count: 113 10*3/uL — ABNORMAL LOW (ref 150–400)
RBC: 2.67 MIL/uL — ABNORMAL LOW (ref 4.22–5.81)
RDW: 18.3 % — ABNORMAL HIGH (ref 11.5–15.5)
WBC Count: 3.5 10*3/uL — ABNORMAL LOW (ref 4.0–10.5)
nRBC: 0 % (ref 0.0–0.2)

## 2023-09-23 LAB — CMP (CANCER CENTER ONLY)
ALT: 23 U/L (ref 0–44)
AST: 31 U/L (ref 15–41)
Albumin: 2.8 g/dL — ABNORMAL LOW (ref 3.5–5.0)
Alkaline Phosphatase: 186 U/L — ABNORMAL HIGH (ref 38–126)
Anion gap: 7 (ref 5–15)
BUN: 22 mg/dL (ref 8–23)
CO2: 27 mmol/L (ref 22–32)
Calcium: 7.8 mg/dL — ABNORMAL LOW (ref 8.9–10.3)
Chloride: 102 mmol/L (ref 98–111)
Creatinine: 1.33 mg/dL — ABNORMAL HIGH (ref 0.61–1.24)
GFR, Estimated: 59 mL/min — ABNORMAL LOW (ref >60)
Glucose, Bld: 121 mg/dL — ABNORMAL HIGH (ref 70–99)
Potassium: 3.7 mmol/L (ref 3.5–5.1)
Sodium: 136 mmol/L (ref 135–145)
Total Bilirubin: 0.9 mg/dL (ref <1.2)
Total Protein: 6 g/dL — ABNORMAL LOW (ref 6.5–8.1)

## 2023-09-23 LAB — MAGNESIUM: Magnesium: 1.5 mg/dL — ABNORMAL LOW (ref 1.7–2.4)

## 2023-09-23 MED ORDER — MAGNESIUM CHLORIDE 64 MG PO TBEC: 1.0000 | DELAYED_RELEASE_TABLET | Freq: Every day | ORAL | 0 refills | Status: DC

## 2023-09-23 MED ORDER — SODIUM CHLORIDE 0.9% FLUSH
10.0000 mL | Freq: Once | INTRAVENOUS | Status: AC
  Administered 2023-09-23: 10 mL via INTRAVENOUS
  Filled 2023-09-23: qty 10

## 2023-09-23 MED ORDER — HEPARIN SOD (PORK) LOCK FLUSH 100 UNIT/ML IV SOLN
500.0000 [IU] | Freq: Once | INTRAVENOUS | Status: AC
Start: 2023-09-23 — End: 2023-09-23
  Administered 2023-09-23: 500 [IU]
  Filled 2023-09-23: qty 5

## 2023-09-23 NOTE — Addendum Note (Signed)
Addended by: Keitha Butte on: 09/23/2023 10:51 AM   Modules accepted: Orders

## 2023-09-23 NOTE — Progress Notes (Signed)
[facility]pt[facility] at [facility] [phone] [phone]  Patient Care Team: Marina Goodell, MD as PCP - General (Family Medicine) [name], MD as Consulting Physician (Oncology) Morene Crocker, MD as Referring Physician (Neurology) Lamar Blinks, MD as Consulting Physician (Cardiology)   NAME OF PATIENT: Abby Denis  [id number]  November 12, 1955   DATE OF VISIT: 09/23/23  REASON FOR CONSULT: REAL VANDENBOSCH is a 67 y.o. male with multiple medical problems including recurrent stage IIIc colon cancer, smoldering myeloma, and prostate cancer.  History of stroke with residual expressive aphasia.  INTERVAL HISTORY: Patient was in clinic on 09/11/2023 by Consuello Masse, NP.  Patient felt poorly and opted to delay cycle 8 FOLFIRI.  Patient subsequently in urgent care 09/18/2023 with fall and leg swelling.  Patient was advised to transfer to the emergency department for evaluation of lower extremity edema and with recommended head CT as patient apparently hit his head when he fell.  Patient ultimately declined ED visit.  He was recommended to take Lasix for edema.  Lasix had previously been held by Dr. Orlie Dakin due to worsening serum creatinine.  Patient presents [facility] today for labs and follow-up.  Patient's only complaint today is bilateral lower extremity edema.  However, this has been a persistent problem for many weeks.  Patient says that he tried taking Lasix for couple days but did not find it particularly helpful.  Denies shortness of breath or chest pain.  Says his appetite has improved but he remains fatigued.  Denies recent fevers or illnesses. Denies any easy bleeding or bruising. Reports weight loss. Denies chest pain. Denies any nausea, vomiting. Denies urinary complaints. Patient offers no further specific complaints today.   PAST MEDICAL HISTORY: Past Medical History:  Diagnosis Date   A-fib (HCC)     Anemia    Arthritis    ankles   Cancer (HCC)    multiple myeloma   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HCC) 10/26/2019   Cardiomyopathy (HCC)    Chemotherapy-induced peripheral neuropathy (HCC)    CHF (congestive heart failure) (HCC)    Depression    Dysrhythmia    atrial fib   History of CVA (cerebrovascular accident)    HLD (hyperlipidemia)    Hypercholesteremia    Hypertension    Moderate tricuspid insufficiency    Multiple myeloma (HCC)    not being treated right now per pt 11/11/19   Pneumonia    Sleep apnea    No CPAP.  OSA resolved with wt loss.    PAST SURGICAL HISTORY:  Past Surgical History:  Procedure Laterality Date   ATRIAL ABLATION SURGERY     CARDIAC SURGERY     A-Fib Ablations   COLONOSCOPY WITH PROPOFOL N/A 10/26/2019   Procedure: COLONOSCOPY WITH BIOPSY;  Surgeon: [name], MD;  Location: [facility];  Service: Endoscopy;  Laterality: N/A;   COLONOSCOPY WITH PROPOFOL N/A 09/03/2022   Procedure: COLONOSCOPY WITH PROPOFOL;  Surgeon: [name], MD;  Location: [facility];  Service: Endoscopy;  Laterality: N/A;   ESOPHAGOGASTRODUODENOSCOPY (EGD) WITH PROPOFOL N/A 10/26/2019   Procedure: ESOPHAGOGASTRODUODENOSCOPY (EGD) WITH BIOPSY;  Surgeon: [name], MD;  Location: [facility];  Service: Endoscopy;  Laterality: N/A;  sleep apnea   LAPAROSCOPIC PARTIAL COLECTOMY Right 11/17/2019   Procedure: LAPAROSCOPIC PARTIAL COLECTOMY RIGHT EXTENDED;  Surgeon: [name], MD;  Location: [facility];  Service: General;  Laterality: Right;   PORTACATH PLACEMENT N/A 12/28/2019   Procedure: INSERTION  PORT-A-CATH Left subclavian;  Surgeon: [name], MD;  Location: [facility];  Service: General;  Laterality: N/A;    HEMATOLOGY/ONCOLOGY HISTORY:  Oncology History  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HCC)  10/27/2019 Initial Diagnosis   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HCC)   12/01/2019 Cancer Staging   Staging form: Colon and Rectum, AJCC 8th Edition - Clinical  stage from 12/01/2019: Stage IIIC (cT3, cN2b, cM0) - Signed by [name], MD on 12/01/2019   12/30/2019 - 06/03/2020 Chemotherapy   Patient is on Treatment Plan : COLORECTAL FOLFOX q14d x 6 months     05/15/2023 -  Chemotherapy   Patient is on Treatment Plan : COLORECTAL FOLFIRI q14d       ALLERGIES:  has No Known Allergies.  MEDICATIONS:  Current Outpatient Medications  Medication Sig Dispense Refill   allopurinol (ZYLOPRIM) 100 MG tablet Take 200 mg by mouth daily.     apixaban (ELIQUIS) 5 MG TABS tablet Take 5 mg by mouth 2 (two) times daily.     APPLE CIDER VINEGAR PO Take 30-45 mLs by mouth every other day.      atorvastatin (LIPITOR) 80 MG tablet Take 80 mg by mouth daily.     colchicine 0.6 MG tablet Take 1 tablet (0.6 mg total) by mouth 2 (two) times daily. Until flare resolves. (Patient not taking: Reported on 09/03/2023) 20 tablet 1   dicyclomine (BENTYL) 20 MG tablet Take by mouth.     furosemide (LASIX) 80 MG tablet Take 40 mg by mouth daily as needed for edema. (Patient not taking: Reported on 09/03/2023)     gabapentin (NEURONTIN) 100 MG capsule Take 300 mg by mouth 2 (two) times daily.     HYDROcodone-acetaminophen (NORCO) 5-325 MG tablet Take 1 tablet by mouth every 6 (six) hours as needed for moderate pain. 30 tablet 0   hydrocortisone (ANUSOL-HC) 2.5 % rectal cream Place 1 Application rectally 2 (two) times daily. 30 g 0   hydrocortisone cream 1 % Apply 1 Application topically 2 (two) times daily.     Lidocaine, Anorectal, 5 % GEL Apply 1 Application topically as needed. 30 g 1   lidocaine-prilocaine (EMLA) cream Apply 1 Application topically daily.     loperamide (IMODIUM A-D) 2 MG tablet Take 2 mg by mouth 4 (four) times daily as needed for diarrhea or loose stools. (Patient not taking: Reported on 09/03/2023)     MAGNESIUM PO Take by mouth.     mirtazapine (REMERON) 7.5 MG tablet Take 1 tablet (7.5 mg total) by mouth at bedtime. 30 tablet 3   Misc Natural  Products (TART CHERRY ADVANCED PO) Take 60 mLs by mouth every other day.     Multiple Vitamins-Minerals (ZINC PO) Take by mouth.     nifedipine 0.3 % ointment Place 1 Application rectally 4 (four) times daily. with Lidocaine 1.5% 30 g 0   potassium bicarbonate (K-LYTE) 25 MEQ disintegrating tablet Take 25 mEq by mouth 2 (two) times daily. In the afternoon.     prochlorperazine (COMPAZINE) 10 MG tablet Take 1 tablet (10 mg total) by mouth every 6 (six) hours as needed for nausea or vomiting. (Patient not taking: Reported on 09/03/2023) 60 tablet 1   tadalafil (CIALIS) 10 MG tablet Take 1-2 tablets (10-20 mg total) by mouth daily as needed for erectile dysfunction (take 45 minutes prior to secual activity). (Patient not taking: Reported on 09/03/2023) 30 tablet 6   tamsulosin (FLOMAX) 0.4 MG CAPS capsule Take 1 capsule (0.4 mg total) by mouth  daily after supper. 30 capsule 6   Zinc Sulfate (ZINC 15 PO) Take by mouth.     No current facility-administered medications for this visit.   Facility-Administered Medications Ordered in Other Visits  Medication Dose Route Frequency Provider Last Rate Last Admin   heparin lock flush 100 unit/mL  500 Units Intravenous Once [name], MD        VITAL SIGNS: There were no vitals taken for this visit. There were no vitals filed for this visit.  Estimated body mass index is 31.9 kg/m as calculated from the following:   Height as of 09/03/23: 5\' 9"  (1.753 m).   Weight as of 09/11/23: 216 lb (98 kg).  LABS: CBC:    Component Value Date/Time   WBC 5.4 09/11/2023 0838   WBC 4.7 07/23/2023 1431   HGB 8.2 (L) 09/11/2023 0838   HGB 14.3 05/13/2013 1116   HCT 26.1 (L) 09/11/2023 0838   HCT 42.9 05/13/2013 1116   PLT 94 (L) 09/11/2023 0838   PLT 169 05/13/2013 1116   MCV 94.9 09/11/2023 0838   MCV 84 05/13/2013 1116   NEUTROABS 4.2 09/11/2023 0838   NEUTROABS 2.3 05/13/2013 1116   LYMPHSABS 0.5 (L) 09/11/2023 0838   LYMPHSABS 0.9 (L)  05/13/2013 1116   MONOABS 0.5 09/11/2023 0838   MONOABS 0.4 05/13/2013 1116   EOSABS 0.1 09/11/2023 0838   EOSABS 0.1 05/13/2013 1116   BASOSABS 0.0 09/11/2023 0838   BASOSABS 0.0 05/13/2013 1116   Comprehensive Metabolic Panel:    Component Value Date/Time   NA 135 09/11/2023 0838   NA 143 05/13/2013 1116   K 4.1 09/11/2023 0838   K 3.5 05/13/2013 1116   CL 109 09/11/2023 0838   CL 102 05/13/2013 1116   CO2 23 09/11/2023 0838   CO2 32 05/13/2013 1116   BUN 15 09/11/2023 0838   BUN 14 05/13/2013 1116   CREATININE 1.43 (H) 09/11/2023 0838   CREATININE 1.53 (H) 05/13/2013 1116   GLUCOSE 127 (H) 09/11/2023 0838   GLUCOSE 122 (H) 05/13/2013 1116   CALCIUM 8.1 (L) 09/11/2023 0838   CALCIUM 8.6 05/13/2013 1116   AST 21 09/11/2023 0838   ALT 16 09/11/2023 0838   ALT 37 07/24/2012 1915   ALKPHOS 196 (H) 09/11/2023 0838   ALKPHOS 129 07/24/2012 1915   BILITOT 0.7 09/11/2023 0838   PROT 5.8 (L) 09/11/2023 0838   PROT 8.4 (H) 07/24/2012 1915   ALBUMIN 2.8 (L) 09/11/2023 0838   ALBUMIN 4.0 07/24/2012 1915    RADIOGRAPHIC STUDIES: No results found.  PERFORMANCE STATUS (ECOG) : 2 - Symptomatic, <50% confined to bed  Review of Systems Unless otherwise noted, a complete review of systems is negative.  Physical Exam General: NAD Cardiovascular: regular rate and rhythm Pulmonary: clear ant fields Abdomen: soft, nontender, + bowel sounds GU: no suprapubic tenderness Extremities: no edema, no joint deformities Skin: no rashes Neurological: Weakness, occasional expressive aphasia  IMPRESSION/PLAN: Colorectal cancer -on treatment FOLFIRI.  Recent PET scan showed improving retroperitoneal nodal metastasis.  Prostate cancer -followed by urology  Myeloma -on surveillance  LE Edema -low suspicion for DVT given bilateral edema and patient is anticoagulated on Eliquis for A-fib.  Patient has hypoalbuminemia, which is likely contributing factor.  Patient does have history of heart  failure but no overt signs of CHF today.  Recommended the patient have follow-up with his cardiologist for recommendations regarding diuretics.  Will also send referral back to nephrology.  Patient has not tried compression stockings.  Discussed conservative measures.  Hypomagnesemia-start Slow-Mag daily  Follow-up tomorrow with Dr. Orlie Dakin.  Case and plan discussed with Dr. Orlie Dakin.  Patient expressed understanding and was in agreement with this plan. He also understands that He can call clinic at any time with any questions, concerns, or complaints.   Thank you for allowing me to participate in the care of this very pleasant patient.   Time Total: 20 minutes  Visit consisted of counseling and education dealing with the complex and emotionally intense issues of symptom management in the setting of serious illness.Greater than 50%  of this time was spent counseling and coordinating care related to the above assessment and plan.  Signed by: Laurette Schimke, PhD, NP-C

## 2023-09-24 ENCOUNTER — Inpatient Hospital Stay: Payer: Medicare HMO

## 2023-09-24 ENCOUNTER — Ambulatory Visit: Payer: Medicare HMO

## 2023-09-24 ENCOUNTER — Inpatient Hospital Stay (HOSPITAL_BASED_OUTPATIENT_CLINIC_OR_DEPARTMENT_OTHER): Payer: Medicare HMO | Admitting: Oncology

## 2023-09-24 ENCOUNTER — Inpatient Hospital Stay: Payer: Medicare HMO | Admitting: Hospice and Palliative Medicine

## 2023-09-24 ENCOUNTER — Encounter: Payer: Self-pay | Admitting: Oncology

## 2023-09-24 VITALS — BP 116/67 | HR 58

## 2023-09-24 VITALS — BP 134/96 | HR 108 | Temp 97.9°F | Resp 18 | Wt 232.2 lb

## 2023-09-24 DIAGNOSIS — C184 Malignant neoplasm of transverse colon: Secondary | ICD-10-CM

## 2023-09-24 DIAGNOSIS — Z5111 Encounter for antineoplastic chemotherapy: Secondary | ICD-10-CM | POA: Diagnosis not present

## 2023-09-24 LAB — CBC WITH DIFFERENTIAL (CANCER CENTER ONLY)
Abs Immature Granulocytes: 0.01 10*3/uL (ref 0.00–0.07)
Basophils Absolute: 0 10*3/uL (ref 0.0–0.1)
Basophils Relative: 1 %
Eosinophils Absolute: 0.1 10*3/uL (ref 0.0–0.5)
Eosinophils Relative: 3 %
HCT: 26.7 % — ABNORMAL LOW (ref 39.0–52.0)
Hemoglobin: 8.2 g/dL — ABNORMAL LOW (ref 13.0–17.0)
Immature Granulocytes: 0 %
Lymphocytes Relative: 10 %
Lymphs Abs: 0.4 10*3/uL — ABNORMAL LOW (ref 0.7–4.0)
MCH: 29.6 pg (ref 26.0–34.0)
MCHC: 30.7 g/dL (ref 30.0–36.0)
MCV: 96.4 fL (ref 80.0–100.0)
Monocytes Absolute: 0.8 10*3/uL (ref 0.1–1.0)
Monocytes Relative: 19 %
Neutro Abs: 2.7 10*3/uL (ref 1.7–7.7)
Neutrophils Relative %: 67 %
Platelet Count: 119 10*3/uL — ABNORMAL LOW (ref 150–400)
RBC: 2.77 MIL/uL — ABNORMAL LOW (ref 4.22–5.81)
RDW: 18 % — ABNORMAL HIGH (ref 11.5–15.5)
WBC Count: 4.1 10*3/uL (ref 4.0–10.5)
nRBC: 0 % (ref 0.0–0.2)

## 2023-09-24 LAB — CMP (CANCER CENTER ONLY)
ALT: 24 U/L (ref 0–44)
AST: 34 U/L (ref 15–41)
Albumin: 2.9 g/dL — ABNORMAL LOW (ref 3.5–5.0)
Alkaline Phosphatase: 201 U/L — ABNORMAL HIGH (ref 38–126)
Anion gap: 8 (ref 5–15)
BUN: 23 mg/dL (ref 8–23)
CO2: 26 mmol/L (ref 22–32)
Calcium: 8.1 mg/dL — ABNORMAL LOW (ref 8.9–10.3)
Chloride: 106 mmol/L (ref 98–111)
Creatinine: 1.41 mg/dL — ABNORMAL HIGH (ref 0.61–1.24)
GFR, Estimated: 55 mL/min — ABNORMAL LOW (ref >60)
Glucose, Bld: 120 mg/dL — ABNORMAL HIGH (ref 70–99)
Potassium: 3.9 mmol/L (ref 3.5–5.1)
Sodium: 140 mmol/L (ref 135–145)
Total Bilirubin: 0.8 mg/dL (ref <1.2)
Total Protein: 6.2 g/dL — ABNORMAL LOW (ref 6.5–8.1)

## 2023-09-24 LAB — CEA: CEA: 157 ng/mL — ABNORMAL HIGH (ref 0.0–4.7)

## 2023-09-24 LAB — MAGNESIUM: Magnesium: 1.6 mg/dL — ABNORMAL LOW (ref 1.7–2.4)

## 2023-09-24 MED ORDER — SODIUM CHLORIDE 0.9 % IV SOLN
2160.0000 mg/m2 | INTRAVENOUS | Status: DC
  Administered 2023-09-24: 5000 mg via INTRAVENOUS
  Filled 2023-09-24: qty 100

## 2023-09-24 MED ORDER — SODIUM CHLORIDE 0.9 % IV SOLN
360.0000 mg/m2 | Freq: Once | INTRAVENOUS | Status: AC
  Administered 2023-09-24: 782 mg via INTRAVENOUS
  Filled 2023-09-24: qty 39.1

## 2023-09-24 MED ORDER — PALONOSETRON HCL INJECTION 0.25 MG/5ML
0.2500 mg | Freq: Once | INTRAVENOUS | Status: AC
  Administered 2023-09-24: 0.25 mg via INTRAVENOUS
  Filled 2023-09-24: qty 5

## 2023-09-24 MED ORDER — DEXAMETHASONE SODIUM PHOSPHATE 10 MG/ML IJ SOLN
10.0000 mg | Freq: Once | INTRAMUSCULAR | Status: AC
Start: 2023-09-24 — End: 2023-09-24
  Administered 2023-09-24: 10 mg via INTRAVENOUS
  Filled 2023-09-24: qty 1

## 2023-09-24 MED ORDER — ATROPINE SULFATE 1 MG/ML IV SOLN
0.5000 mg | Freq: Once | INTRAVENOUS | Status: AC | PRN
  Administered 2023-09-24: 0.5 mg via INTRAVENOUS
  Filled 2023-09-24: qty 1

## 2023-09-24 MED ORDER — FLUOROURACIL CHEMO INJECTION 2.5 GM/50ML
360.0000 mg/m2 | Freq: Once | INTRAVENOUS | Status: AC
  Administered 2023-09-24: 800 mg via INTRAVENOUS
  Filled 2023-09-24: qty 16

## 2023-09-24 MED ORDER — SODIUM CHLORIDE 0.9 % IV SOLN
160.0000 mg/m2 | Freq: Once | INTRAVENOUS | Status: AC
  Administered 2023-09-24: 340 mg via INTRAVENOUS
  Filled 2023-09-24: qty 15

## 2023-09-24 MED ORDER — SODIUM CHLORIDE 0.9 % IV SOLN
Freq: Once | INTRAVENOUS | Status: AC
  Filled 2023-09-24: qty 250

## 2023-09-24 NOTE — Patient Instructions (Signed)
[facility] - A DEPT OF [facility]  Discharge Instructions: Thank you for choosing [facility] to provide your oncology and hematology care.  If you have a lab appointment with the Cancer Center, please go directly to the Cancer Center and check in at the registration area.  Wear comfortable clothing and clothing appropriate for easy access to any Portacath or PICC line.   We strive to give you quality time with your provider. You may need to reschedule your appointment if you arrive late (15 or more minutes).  Arriving late affects you and other patients whose appointments are after yours.  Also, if you miss three or more appointments without notifying the office, you may be dismissed from the clinic at the provider's discretion.      For prescription refill requests, have your pharmacy contact our office and allow 72 hours for refills to be completed.    Today you received the following chemotherapy and/or immunotherapy agents Irinotecan, Leucovorin and Adrucil      To help prevent nausea and vomiting after your treatment, we encourage you to take your nausea medication as directed.  BELOW ARE SYMPTOMS THAT SHOULD BE REPORTED IMMEDIATELY: *FEVER GREATER THAN 100.4 F (38 C) OR HIGHER *CHILLS OR SWEATING *NAUSEA AND VOMITING THAT IS NOT CONTROLLED WITH YOUR NAUSEA MEDICATION *UNUSUAL SHORTNESS OF BREATH *UNUSUAL BRUISING OR BLEEDING *URINARY PROBLEMS (pain or burning when urinating, or frequent urination) *BOWEL PROBLEMS (unusual diarrhea, constipation, pain near the anus) TENDERNESS IN MOUTH AND THROAT WITH OR WITHOUT PRESENCE OF ULCERS (sore throat, sores in mouth, or a toothache) UNUSUAL RASH, SWELLING OR PAIN  UNUSUAL VAGINAL DISCHARGE OR ITCHING   Items with * indicate a potential emergency and should be followed up as soon as possible or go to the Emergency Department if any problems should occur.  Please show the CHEMOTHERAPY ALERT  CARD or IMMUNOTHERAPY ALERT CARD at check-in to the Emergency Department and triage nurse.  Should you have questions after your visit or need to cancel or reschedule your appointment, please contact [facility] - A DEPT OF [facility]  [phone] and follow the prompts.  Office hours are 8:00 a.m. to 4:30 p.m. Monday - Friday. Please note that voicemails left after 4:00 p.m. may not be returned until the following business day.  We are closed weekends and major holidays. You have access to a nurse at all times for urgent questions. Please call the main number to the clinic [phone] and follow the prompts.  For any non-urgent questions, you may also contact your provider using MyChart. We now offer e-Visits for anyone 35 and older to request care online for non-urgent symptoms. For details visit [email or web address].   Also download the MyChart app! Go to the app store, search "MyChart", open the app, select [facility], and log in with your MyChart username and password.

## 2023-09-24 NOTE — Patient Instructions (Signed)
You have an appointment scheduled with Dr. Cherylann Ratel (kidney doctor in [place]) on Wednesday 11/20 at 2:20 pm.  You can go to [facility] anytime after 2:00 pm today for fitting of compression stockings.   We have left a message with cardiology about scheduling a follow up appointment and will reach out once we receive a call back.

## 2023-09-24 NOTE — Progress Notes (Signed)
[name]  [phone] [phone]  ID: Chris George OB: 11/02/1956  MR#: [id number]  SWF#:[id number]  Patient Care Team: Marina Goodell, MD as PCP - General (Family Medicine) [name], MD as Consulting Physician (Oncology) Morene Crocker, MD as Referring Physician (Neurology) Mady Haagensen, MD as Consulting Physician (Nephrology)  CHIEF COMPLAINT: Recurrent colon cancer, smoldering myeloma, prostate cancer.  INTERVAL HISTORY: Patient returns to clinic today for further evaluation and consideration of cycle 8 of FOLFIRI.  He has worsening peripheral edema, insomnia, and chronic weakness and fatigue. He continues to have a mild expressive aphasia and a chronic peripheral neuropathy, but no other neurologic complaints. He denies any recent fevers or illnesses.  He has a good appetite and denies weight loss.  He has no chest pain, shortness of breath, cough, or hemoptysis.  He denies any nausea, vomiting, constipation, or diarrhea.  He has no melena or hematochezia.  He has no urinary complaints.  Patient feels generally terrible, but offers no further specific complaints today.  REVIEW OF SYSTEMS:   Review of Systems  Constitutional:  Positive for malaise/fatigue. Negative for fever and weight loss.  Respiratory: Negative.  Negative for cough, hemoptysis and shortness of breath.   Cardiovascular:  Positive for leg swelling. Negative for chest pain.  Gastrointestinal: Negative.  Negative for blood in stool, diarrhea and melena.  Genitourinary: Negative.  Negative for hematuria.  Musculoskeletal: Negative.  Negative for back pain and joint pain.  Skin: Negative.  Negative for rash.  Neurological:  Positive for tingling, sensory change and weakness. Negative for dizziness, focal weakness and headaches.  Psychiatric/Behavioral: Negative.  Negative for depression. The patient is not nervous/anxious.     As per HPI. Otherwise, a  complete review of systems is negative.  PAST MEDICAL HISTORY: Past Medical History:  Diagnosis Date   A-fib (HCC)    Anemia    Arthritis    ankles   Cancer (HCC)    multiple myeloma   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HCC) 10/26/2019   Cardiomyopathy (HCC)    Chemotherapy-induced peripheral neuropathy (HCC)    CHF (congestive heart failure) (HCC)    Depression    Dysrhythmia    atrial fib   History of CVA (cerebrovascular accident)    HLD (hyperlipidemia)    Hypercholesteremia    Hypertension    Moderate tricuspid insufficiency    Multiple myeloma (HCC)    not being treated right now per pt 11/11/19   Pneumonia    Sleep apnea    No CPAP.  OSA resolved with wt loss.    PAST SURGICAL HISTORY: Past Surgical History:  Procedure Laterality Date   ATRIAL ABLATION SURGERY     CARDIAC SURGERY     A-Fib Ablations   COLONOSCOPY WITH PROPOFOL N/A 10/26/2019   Procedure: COLONOSCOPY WITH BIOPSY;  Surgeon: [name], MD;  Location: [facility];  Service: Endoscopy;  Laterality: N/A;   COLONOSCOPY WITH PROPOFOL N/A 09/03/2022   Procedure: COLONOSCOPY WITH PROPOFOL;  Surgeon: [name], MD;  Location: [facility];  Service: Endoscopy;  Laterality: N/A;   ESOPHAGOGASTRODUODENOSCOPY (EGD) WITH PROPOFOL N/A 10/26/2019   Procedure: ESOPHAGOGASTRODUODENOSCOPY (EGD) WITH BIOPSY;  Surgeon: [name], MD;  Location: [facility];  Service: Endoscopy;  Laterality: N/A;  sleep apnea   LAPAROSCOPIC PARTIAL COLECTOMY Right 11/17/2019   Procedure: LAPAROSCOPIC PARTIAL COLECTOMY RIGHT EXTENDED;  Surgeon: [name], MD;  Location: [facility];  Service: General;  Laterality: Right;   PORTACATH PLACEMENT N/A  12/28/2019   Procedure: INSERTION PORT-A-CATH Left subclavian;  Surgeon: [name], MD;  Location: [facility];  Service: General;  Laterality: N/A;    FAMILY HISTORY: Family History  Problem Relation Age of Onset   Healthy Mother     ADVANCED DIRECTIVES (Y/N):   N  HEALTH MAINTENANCE: Social History   Tobacco Use   Smoking status: Never    Passive exposure: Never   Smokeless tobacco: Never  Vaping Use   Vaping status: Never Used  Substance Use Topics   Alcohol use: Not Currently   Drug use: Never     Colonoscopy:  PAP:  Bone density:  Lipid panel:  No Known Allergies  Current Outpatient Medications  Medication Sig Dispense Refill   allopurinol (ZYLOPRIM) 100 MG tablet Take 200 mg by mouth daily.     apixaban (ELIQUIS) 5 MG TABS tablet Take 5 mg by mouth 2 (two) times daily.     APPLE CIDER VINEGAR PO Take 30-45 mLs by mouth every other day.      atorvastatin (LIPITOR) 80 MG tablet Take 80 mg by mouth daily.     dicyclomine (BENTYL) 20 MG tablet Take by mouth.     gabapentin (NEURONTIN) 100 MG capsule Take 300 mg by mouth 2 (two) times daily.     HYDROcodone-acetaminophen (NORCO) 5-325 MG tablet Take 1 tablet by mouth every 6 (six) hours as needed for moderate pain. 30 tablet 0   hydrocortisone (ANUSOL-HC) 2.5 % rectal cream Place 1 Application rectally 2 (two) times daily. 30 g 0   hydrocortisone cream 1 % Apply 1 Application topically 2 (two) times daily.     Lidocaine, Anorectal, 5 % GEL Apply 1 Application topically as needed. 30 g 1   lidocaine-prilocaine (EMLA) cream Apply 1 Application topically daily.     magnesium chloride (SLOW-MAG) 64 MG TBEC SR tablet Take 1 tablet (64 mg total) by mouth daily. 60 tablet 0   MAGNESIUM PO Take by mouth.     mirtazapine (REMERON) 7.5 MG tablet Take 1 tablet (7.5 mg total) by mouth at bedtime. 30 tablet 3   Misc Natural Products (TART CHERRY ADVANCED PO) Take 60 mLs by mouth every other day.     Multiple Vitamins-Minerals (ZINC PO) Take by mouth.     nifedipine 0.3 % ointment Place 1 Application rectally 4 (four) times daily. with Lidocaine 1.5% 30 g 0   potassium bicarbonate (K-LYTE) 25 MEQ disintegrating tablet Take 25 mEq by mouth 2 (two) times daily. In the afternoon.     tamsulosin  (FLOMAX) 0.4 MG CAPS capsule Take 1 capsule (0.4 mg total) by mouth daily after supper. 30 capsule 6   Zinc Sulfate (ZINC 15 PO) Take by mouth.     colchicine 0.6 MG tablet Take 1 tablet (0.6 mg total) by mouth 2 (two) times daily. Until flare resolves. (Patient not taking: Reported on 09/03/2023) 20 tablet 1   furosemide (LASIX) 80 MG tablet Take 40 mg by mouth daily as needed for edema. (Patient not taking: Reported on 09/03/2023)     loperamide (IMODIUM A-D) 2 MG tablet Take 2 mg by mouth 4 (four) times daily as needed for diarrhea or loose stools. (Patient not taking: Reported on 09/03/2023)     prochlorperazine (COMPAZINE) 10 MG tablet Take 1 tablet (10 mg total) by mouth every 6 (six) hours as needed for nausea or vomiting. (Patient not taking: Reported on 09/03/2023) 60 tablet 1   tadalafil (CIALIS) 10 MG tablet Take 1-2 tablets (10-20 mg  total) by mouth daily as needed for erectile dysfunction (take 45 minutes prior to secual activity). (Patient not taking: Reported on 09/03/2023) 30 tablet 6   No current facility-administered medications for this visit.   Facility-Administered Medications Ordered in Other Visits  Medication Dose Route Frequency Provider Last Rate Last Admin   atropine injection 0.5 mg  0.5 mg Intravenous Once PRN [name], MD       dexamethasone (DECADRON) injection 10 mg  10 mg Intravenous Once [name], MD       fluorouracil (ADRUCIL) 5,000 mg in sodium chloride 0.9 % 150 mL chemo infusion  2,160 mg/m2 (Treatment Plan Recorded) Intravenous 1 day or 1 dose [name], MD       fluorouracil (ADRUCIL) chemo injection 800 mg  360 mg/m2 (Treatment Plan Recorded) Intravenous Once [name], MD       heparin lock flush 100 unit/mL  500 Units Intravenous Once [name], MD       irinotecan (CAMPTOSAR) 340 mg in sodium chloride 0.9 % 500 mL chemo infusion  160 mg/m2 (Treatment Plan Recorded) Intravenous Once [name],  MD       leucovorin 782 mg in sodium chloride 0.9 % 250 mL infusion  360 mg/m2 (Treatment Plan Recorded) Intravenous Once [name], MD       palonosetron (ALOXI) injection 0.25 mg  0.25 mg Intravenous Once [name], MD        OBJECTIVE: Vitals:   09/24/23 0856  BP: (!) 134/96  Pulse: (!) 108  Resp: 18  Temp: 97.9 F (36.6 C)  SpO2: 100%       Body mass index is 34.29 kg/m.    ECOG FS:1 - Symptomatic but completely ambulatory  General: Well-developed, well-nourished, no acute distress. Eyes: Pink conjunctiva, anicteric sclera. HEENT: Normocephalic, moist mucous membranes. Lungs: No audible wheezing or coughing. Heart: Regular rate and rhythm. Abdomen: Soft, nontender, no obvious distention. Musculoskeletal: No edema, cyanosis, or clubbing. Neuro: Alert, answering all questions appropriately. Cranial nerves grossly intact. Skin: No rashes or petechiae noted. Psych: Normal affect.  LAB RESULTS:  Lab Results  Component Value Date   NA 140 09/24/2023   K 3.9 09/24/2023   CL 106 09/24/2023   CO2 26 09/24/2023   GLUCOSE 120 (H) 09/24/2023   BUN 23 09/24/2023   CREATININE 1.41 (H) 09/24/2023   CALCIUM 8.1 (L) 09/24/2023   PROT 6.2 (L) 09/24/2023   ALBUMIN 2.9 (L) 09/24/2023   AST 34 09/24/2023   ALT 24 09/24/2023   ALKPHOS 201 (H) 09/24/2023   BILITOT 0.8 09/24/2023   GFRNONAA 55 (L) 09/24/2023   GFRAA >60 07/29/2020    Lab Results  Component Value Date   WBC 4.1 09/24/2023   NEUTROABS 2.7 09/24/2023   HGB 8.2 (L) 09/24/2023   HCT 26.7 (L) 09/24/2023   MCV 96.4 09/24/2023   PLT 119 (L) 09/24/2023   Lab Results  Component Value Date   IRON 79 08/13/2023   TIBC 234 (L) 08/13/2023   IRONPCTSAT 34 08/13/2023   Lab Results  Component Value Date   FERRITIN 686 (H) 09/03/2023     STUDIES: No results found.  ONCOLOGY HISTORY: Patient completed cycle 9 of adjuvant FOLFOX on June 03, 2020.  Given his difficulties with treatment and  declining performance status, treatment was discontinued altogether. Colonoscopy on September 03, 2022 did not reveal any significant pathology and recommendation was to repeat in 3 years.    ASSESSMENT: Stage  IIIc colon cancer, smoldering myeloma, prostate cancer.  PLAN:    Stage IIIc colon cancer:  CT scan from December 07, 2022 reviewed independently with enlarging aortocaval node now measuring 1.9 x 1.4 cm.  PET scan results from Mar 15, 2023 reviewed independently with progressive retroperitoneal lymphadenopathy presumably related to patient's colon cancer.  Patient CEA was initially trending down, but not having treatment in nearly a month it has trended back up and is 157 today.  Repeat PET scan on August 15, 2023 reviewed independently and reported as above with significant improvement of patient's disease burden.  Proceed with cycle 8 of treatment today.  Treatment was previously dose reduced 10% given his renal insufficiency and baseline pancytopenia.  Will hold Avastin at this time given his recent stroke.  Return to clinic in 2 days for pump removal and then in 2 weeks for further evaluation and consideration of cycle 9.  Will reimage at the conclusion of cycle 12.   Prostate cancer: Gleason's 3+4.  Patient's PSA increased and peaked at 39.34, but since completing treatment has decreased and is now 8.26.  Nuclear medicine bone scan on December 13, 2022 was reported as negative.  PSMA PET per radiation oncology from Mar 21, 2023 did not reveal metastatic disease.  Patient completed XRT for local control disease on May 30, 2023.  Repeat imaging as above.  Continue follow-up with urology as indicated. Smoldering myeloma: Chronic and unchanged.  Patient was initially diagnosed and treated in [place], [place] and treated with single agent Velcade in approximately 2013.  He declined maintenance treatment or referral for bone marrow transplant.  His M spike has ranged from 1.1-2.0 since July 2020.  His  most recent result was 1.6.  Over the same timeframe his IgG component has ranged from [phone].  His most recent result is 2344.  Finally, his kappa free light chains have ranged from 24.8 -75.3 with his most recent result reported at 36.2.  Nuclear med bone scan as above.  His most recent bone marrow biopsy completed on May 28, 2019 revealed only 10 to 15% plasma cells with no clonality reported.  Cytogenetics were also reported as normal.  Continue to monitor closely.  Anemia: Chronic and unchanged.  Patient's hemoglobin is 8.2 today.  Continue to monitor closely.   Neutropenia: Resolved.  Continue Udenyca with pump removal.  Dose reduced FOLFIRI as above. Thrombocytopenia: Chronic and unchanged.  Patient's platelet count 119 today.  Proceed cautiously with treatment as above.   Renal insufficiency: Chronic and unchanged.  Patient's most recent creatinine is 1.41.  Neither irinotecan or 5-FU need to be dose reduced in the setting of renal insufficiency.  Given his worsening peripheral edema and minimal response to increasing doses of Lasix, patient was given a referral back to nephrology which he will see tomorrow. Hypomagnesia: Chronic and unchanged.  Patient's magnesium is 1.6.  Monitor. Peripheral neuropathy: Chronic and unchanged.  Patient reports gabapentin did not help.  He was recently initiated on Lyrica 50 mg daily and given a referral to neurology.   Hypotension: Resolved. Coping/anxiety: Patient was previously given a referral to [facility]. Peripheral edema: Nephrology as above.  Patient was also given a referral back to cardiology.  I have also recommended compression stockings.  Patient expressed understanding and was in agreement with this plan. He also understands that He can call clinic at any time with any questions, concerns, or complaints.    [name], MD   09/24/2023 9:57 AM

## 2023-09-25 ENCOUNTER — Ambulatory Visit: Payer: Medicare HMO

## 2023-09-25 ENCOUNTER — Other Ambulatory Visit: Payer: Medicare HMO

## 2023-09-25 ENCOUNTER — Ambulatory Visit: Payer: Medicare HMO | Admitting: Oncology

## 2023-09-25 ENCOUNTER — Inpatient Hospital Stay: Payer: Medicare HMO

## 2023-09-25 ENCOUNTER — Encounter: Payer: Self-pay | Admitting: Oncology

## 2023-09-25 LAB — CEA: CEA: 175 ng/mL — ABNORMAL HIGH (ref 0.0–4.7)

## 2023-09-25 NOTE — Progress Notes (Signed)
Nutrition  Patient did not come to scheduled nutrition appointment today.   Diany Formosa B. Freida Busman, RD, LDN Registered Dietitian [phone]

## 2023-09-26 ENCOUNTER — Inpatient Hospital Stay: Payer: Medicare HMO

## 2023-09-26 ENCOUNTER — Ambulatory Visit: Payer: Medicare HMO

## 2023-09-26 ENCOUNTER — Encounter: Payer: Self-pay | Admitting: Oncology

## 2023-09-26 ENCOUNTER — Other Ambulatory Visit: Payer: Self-pay | Admitting: *Deleted

## 2023-09-26 VITALS — BP 109/72 | HR 100 | Resp 18

## 2023-09-26 DIAGNOSIS — C184 Malignant neoplasm of transverse colon: Secondary | ICD-10-CM

## 2023-09-26 DIAGNOSIS — Z5111 Encounter for antineoplastic chemotherapy: Secondary | ICD-10-CM | POA: Diagnosis not present

## 2023-09-26 DIAGNOSIS — C61 Malignant neoplasm of prostate: Secondary | ICD-10-CM

## 2023-09-26 MED ORDER — HEPARIN SOD (PORK) LOCK FLUSH 100 UNIT/ML IV SOLN
500.0000 [IU] | Freq: Once | INTRAVENOUS | Status: AC | PRN
  Administered 2023-09-26: 500 [IU]
  Filled 2023-09-26: qty 5

## 2023-09-26 MED ORDER — SODIUM CHLORIDE 0.9% FLUSH
10.0000 mL | INTRAVENOUS | Status: DC | PRN
  Administered 2023-09-26: 10 mL
  Filled 2023-09-26: qty 10

## 2023-09-26 MED ORDER — PEGFILGRASTIM-JMDB 6 MG/0.6ML ~~LOC~~ SOSY
6.0000 mg | PREFILLED_SYRINGE | Freq: Once | SUBCUTANEOUS | Status: AC
  Administered 2023-09-26: 6 mg via SUBCUTANEOUS
  Filled 2023-09-26: qty 0.6

## 2023-09-26 NOTE — Patient Instructions (Signed)

## 2023-09-26 NOTE — Progress Notes (Addendum)
Nutrition Follow-up:  RN, Dois Davenport sent message saying that patient wanted to speak with RD (pump removed today)  Patient with recurrent stage III colon cancer, smoldering myeloma, prostate cancer.  Patient receiving folfir.  Spoke with patient via phone.  Reports that Dr Cherylann Ratel wants him to increase his protein due to swelling in legs and low albumin.  Note reviewed from Dr Cherylann Ratel.     Medications: Lasix 20mg  being added back and compression socks  Labs: reviewed  Anthropometrics:   Weight 232 lb 3.2 oz on 11/9 (fluid)  214 lb on 10/29 217 lb on 8/10 220 lb on 5/13  NUTRITION DIAGNOSIS: Inadequate oral intake ongoing   INTERVENTION:  Discussed foods that are high in protein.  Examples provided. Encouraged lean protein food at every meal/snack (ie banana with peanut butter, meat sandwich, etc) Encouraged less sodium in diet. Patient says he does not add salt to foods.   Encouraged taking lasix and wearing compression socks as well.     MONITORING, EVALUATION, GOAL: weight trends, intake   NEXT VISIT: Tuesday, Dec 3rd during infusion  Tumeka Chimenti B. Freida Busman, RD, LDN Registered Dietitian [phone]

## 2023-09-30 ENCOUNTER — Telehealth: Payer: Self-pay

## 2023-09-30 ENCOUNTER — Ambulatory Visit: Payer: Medicare HMO

## 2023-09-30 NOTE — Telephone Encounter (Signed)
Pt no showed to today's ST appointment. Attempted to contact pt via preferred telephone number. Pt's voice mailbox is full.

## 2023-10-02 ENCOUNTER — Other Ambulatory Visit: Payer: Self-pay | Admitting: *Deleted

## 2023-10-02 ENCOUNTER — Encounter: Payer: Self-pay | Admitting: Oncology

## 2023-10-02 ENCOUNTER — Inpatient Hospital Stay: Payer: Medicare HMO

## 2023-10-02 DIAGNOSIS — C61 Malignant neoplasm of prostate: Secondary | ICD-10-CM

## 2023-10-02 NOTE — Progress Notes (Signed)
error 

## 2023-10-07 ENCOUNTER — Encounter: Payer: Self-pay | Admitting: Oncology

## 2023-10-08 ENCOUNTER — Inpatient Hospital Stay: Payer: Medicare HMO

## 2023-10-08 ENCOUNTER — Telehealth: Payer: Self-pay

## 2023-10-08 ENCOUNTER — Encounter: Payer: Self-pay | Admitting: Oncology

## 2023-10-08 ENCOUNTER — Inpatient Hospital Stay: Payer: Medicare HMO | Attending: Oncology

## 2023-10-08 ENCOUNTER — Inpatient Hospital Stay (HOSPITAL_BASED_OUTPATIENT_CLINIC_OR_DEPARTMENT_OTHER): Payer: Medicare HMO | Admitting: Oncology

## 2023-10-08 VITALS — BP 99/69 | HR 93 | Temp 97.6°F | Resp 19 | Wt 221.7 lb

## 2023-10-08 DIAGNOSIS — C184 Malignant neoplasm of transverse colon: Secondary | ICD-10-CM | POA: Diagnosis not present

## 2023-10-08 DIAGNOSIS — D649 Anemia, unspecified: Secondary | ICD-10-CM

## 2023-10-08 DIAGNOSIS — Z95828 Presence of other vascular implants and grafts: Secondary | ICD-10-CM

## 2023-10-08 DIAGNOSIS — Z5111 Encounter for antineoplastic chemotherapy: Secondary | ICD-10-CM | POA: Diagnosis not present

## 2023-10-08 DIAGNOSIS — C182 Malignant neoplasm of ascending colon: Secondary | ICD-10-CM | POA: Diagnosis present

## 2023-10-08 DIAGNOSIS — C61 Malignant neoplasm of prostate: Secondary | ICD-10-CM | POA: Insufficient documentation

## 2023-10-08 DIAGNOSIS — R609 Edema, unspecified: Secondary | ICD-10-CM | POA: Insufficient documentation

## 2023-10-08 DIAGNOSIS — D472 Monoclonal gammopathy: Secondary | ICD-10-CM | POA: Insufficient documentation

## 2023-10-08 DIAGNOSIS — G629 Polyneuropathy, unspecified: Secondary | ICD-10-CM | POA: Diagnosis not present

## 2023-10-08 DIAGNOSIS — D696 Thrombocytopenia, unspecified: Secondary | ICD-10-CM | POA: Diagnosis not present

## 2023-10-08 DIAGNOSIS — F419 Anxiety disorder, unspecified: Secondary | ICD-10-CM | POA: Diagnosis not present

## 2023-10-08 LAB — CMP (CANCER CENTER ONLY)
ALT: 13 U/L (ref 0–44)
AST: 20 U/L (ref 15–41)
Albumin: 2.9 g/dL — ABNORMAL LOW (ref 3.5–5.0)
Alkaline Phosphatase: 203 U/L — ABNORMAL HIGH (ref 38–126)
Anion gap: 7 (ref 5–15)
BUN: 16 mg/dL (ref 8–23)
CO2: 24 mmol/L (ref 22–32)
Calcium: 8.2 mg/dL — ABNORMAL LOW (ref 8.9–10.3)
Chloride: 107 mmol/L (ref 98–111)
Creatinine: 1.57 mg/dL — ABNORMAL HIGH (ref 0.61–1.24)
GFR, Estimated: 48 mL/min — ABNORMAL LOW (ref >60)
Glucose, Bld: 120 mg/dL — ABNORMAL HIGH (ref 70–99)
Potassium: 4.1 mmol/L (ref 3.5–5.1)
Sodium: 138 mmol/L (ref 135–145)
Total Bilirubin: 0.7 mg/dL (ref <1.2)
Total Protein: 6.1 g/dL — ABNORMAL LOW (ref 6.5–8.1)

## 2023-10-08 LAB — CBC WITH DIFFERENTIAL (CANCER CENTER ONLY)
Abs Immature Granulocytes: 0.29 10*3/uL — ABNORMAL HIGH (ref 0.00–0.07)
Basophils Absolute: 0 10*3/uL (ref 0.0–0.1)
Basophils Relative: 0 %
Eosinophils Absolute: 0.1 10*3/uL (ref 0.0–0.5)
Eosinophils Relative: 1 %
HCT: 26.1 % — ABNORMAL LOW (ref 39.0–52.0)
Hemoglobin: 8 g/dL — ABNORMAL LOW (ref 13.0–17.0)
Immature Granulocytes: 4 %
Lymphocytes Relative: 8 %
Lymphs Abs: 0.5 10*3/uL — ABNORMAL LOW (ref 0.7–4.0)
MCH: 29.3 pg (ref 26.0–34.0)
MCHC: 30.7 g/dL (ref 30.0–36.0)
MCV: 95.6 fL (ref 80.0–100.0)
Monocytes Absolute: 0.6 10*3/uL (ref 0.1–1.0)
Monocytes Relative: 9 %
Neutro Abs: 5.2 10*3/uL (ref 1.7–7.7)
Neutrophils Relative %: 78 %
Platelet Count: 95 10*3/uL — ABNORMAL LOW (ref 150–400)
RBC: 2.73 MIL/uL — ABNORMAL LOW (ref 4.22–5.81)
RDW: 17.3 % — ABNORMAL HIGH (ref 11.5–15.5)
WBC Count: 6.7 10*3/uL (ref 4.0–10.5)
nRBC: 0 % (ref 0.0–0.2)

## 2023-10-08 LAB — MAGNESIUM: Magnesium: 1.7 mg/dL (ref 1.7–2.4)

## 2023-10-08 LAB — PSA: Prostatic Specific Antigen: 4.24 ng/mL — ABNORMAL HIGH (ref 0.00–4.00)

## 2023-10-08 LAB — PREPARE RBC (CROSSMATCH)

## 2023-10-08 MED ORDER — HEPARIN SOD (PORK) LOCK FLUSH 100 UNIT/ML IV SOLN
500.0000 [IU] | Freq: Once | INTRAVENOUS | Status: AC
Start: 2023-10-08 — End: 2023-10-08
  Administered 2023-10-08: 500 [IU] via INTRAVENOUS
  Filled 2023-10-08: qty 5

## 2023-10-08 NOTE — Progress Notes (Signed)
[facility]  [phone] Fax:([phone]) [phone]  ID: Chris George OB: 16-Aug-1956  MR#: [id number]  AOZ#:[id number]  Patient Care Team: Marina Goodell, MD as PCP - General (Family Medicine) [name], MD as Consulting Physician (Oncology) Morene Crocker, MD as Referring Physician (Neurology) Mady Haagensen, MD as Consulting Physician (Nephrology)  CHIEF COMPLAINT: Recurrent colon cancer, smoldering myeloma, prostate cancer.  INTERVAL HISTORY: Patient returns to clinic today for further evaluation and consideration of cycle 9 of FOLFIRI.  Patient states he had a longer recovery from his last chemotherapy and is only felt well the past 2 days.  He continues to have chronic weakness and fatigue.  He continues to have a mild expressive aphasia and a chronic peripheral neuropathy, but no other neurologic complaints. He denies any recent fevers or illnesses.  He has a fair appetite and denies weight loss.  He has no chest pain, shortness of breath, cough, or hemoptysis.  He denies any nausea, vomiting, constipation, or diarrhea.  He has no melena or hematochezia.  He has no urinary complaints.  Patient offers no further specific complaints today.  REVIEW OF SYSTEMS:   Review of Systems  Constitutional:  Positive for malaise/fatigue. Negative for fever and weight loss.  Respiratory: Negative.  Negative for cough, hemoptysis and shortness of breath.   Cardiovascular:  Positive for leg swelling. Negative for chest pain.  Gastrointestinal: Negative.  Negative for blood in stool, diarrhea and melena.  Genitourinary: Negative.  Negative for hematuria.  Musculoskeletal: Negative.  Negative for back pain and joint pain.  Skin: Negative.  Negative for rash.  Neurological:  Positive for tingling, sensory change and weakness. Negative for dizziness, focal weakness and headaches.  Psychiatric/Behavioral: Negative.  Negative for depression. The patient is not  nervous/anxious.     As per HPI. Otherwise, a complete review of systems is negative.  PAST MEDICAL HISTORY: Past Medical History:  Diagnosis Date   A-fib (HCC)    Anemia    Arthritis    ankles   Cancer (HCC)    multiple myeloma   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HCC) 10/26/2019   Cardiomyopathy (HCC)    Chemotherapy-induced peripheral neuropathy (HCC)    CHF (congestive heart failure) (HCC)    Depression    Dysrhythmia    atrial fib   History of CVA (cerebrovascular accident)    HLD (hyperlipidemia)    Hypercholesteremia    Hypertension    Moderate tricuspid insufficiency    Multiple myeloma (HCC)    not being treated right now per pt 11/11/19   Pneumonia    Sleep apnea    No CPAP.  OSA resolved with wt loss.    PAST SURGICAL HISTORY: Past Surgical History:  Procedure Laterality Date   ATRIAL ABLATION SURGERY     CARDIAC SURGERY     A-Fib Ablations   COLONOSCOPY WITH PROPOFOL N/A 10/26/2019   Procedure: COLONOSCOPY WITH BIOPSY;  Surgeon: [name], MD;  Location: [facility];  Service: Endoscopy;  Laterality: N/A;   COLONOSCOPY WITH PROPOFOL N/A 09/03/2022   Procedure: COLONOSCOPY WITH PROPOFOL;  Surgeon: [name], MD;  Location: [facility];  Service: Endoscopy;  Laterality: N/A;   ESOPHAGOGASTRODUODENOSCOPY (EGD) WITH PROPOFOL N/A 10/26/2019   Procedure: ESOPHAGOGASTRODUODENOSCOPY (EGD) WITH BIOPSY;  Surgeon: [name], MD;  Location: [facility];  Service: Endoscopy;  Laterality: N/A;  sleep apnea   LAPAROSCOPIC PARTIAL COLECTOMY Right 11/17/2019   Procedure: LAPAROSCOPIC PARTIAL COLECTOMY RIGHT EXTENDED;  Surgeon: [name], MD;  Location: [facility];  Service: General;  Laterality: Right;   PORTACATH PLACEMENT N/A 12/28/2019   Procedure: INSERTION PORT-A-CATH Left subclavian;  Surgeon: [name], MD;  Location: [facility];  Service: General;  Laterality: N/A;    FAMILY HISTORY: Family History  Problem Relation Age of Onset    Healthy Mother     ADVANCED DIRECTIVES (Y/N):  N  HEALTH MAINTENANCE: Social History   Tobacco Use   Smoking status: Never    Passive exposure: Never   Smokeless tobacco: Never  Vaping Use   Vaping status: Never Used  Substance Use Topics   Alcohol use: Not Currently   Drug use: Never     Colonoscopy:  PAP:  Bone density:  Lipid panel:  No Known Allergies  Current Outpatient Medications  Medication Sig Dispense Refill   allopurinol (ZYLOPRIM) 100 MG tablet Take 200 mg by mouth daily.     apixaban (ELIQUIS) 5 MG TABS tablet Take 5 mg by mouth 2 (two) times daily.     APPLE CIDER VINEGAR PO Take 30-45 mLs by mouth every other day.      atorvastatin (LIPITOR) 80 MG tablet Take 80 mg by mouth daily.     dicyclomine (BENTYL) 20 MG tablet Take by mouth.     furosemide (LASIX) 20 MG tablet Take by mouth.     gabapentin (NEURONTIN) 100 MG capsule Take 300 mg by mouth 2 (two) times daily.     HYDROcodone-acetaminophen (NORCO) 5-325 MG tablet Take 1 tablet by mouth every 6 (six) hours as needed for moderate pain. 30 tablet 0   hydrocortisone (ANUSOL-HC) 2.5 % rectal cream Place 1 Application rectally 2 (two) times daily. 30 g 0   hydrocortisone cream 1 % Apply 1 Application topically 2 (two) times daily.     Lidocaine, Anorectal, 5 % GEL Apply 1 Application topically as needed. 30 g 1   lidocaine-prilocaine (EMLA) cream Apply 1 Application topically daily.     magnesium chloride (SLOW-MAG) 64 MG TBEC SR tablet Take 1 tablet (64 mg total) by mouth daily. 60 tablet 0   MAGNESIUM PO Take by mouth.     mirtazapine (REMERON) 7.5 MG tablet Take 1 tablet (7.5 mg total) by mouth at bedtime. 30 tablet 3   Misc Natural Products (TART CHERRY ADVANCED PO) Take 60 mLs by mouth every other day.     Multiple Vitamins-Minerals (ZINC PO) Take by mouth.     nifedipine 0.3 % ointment Place 1 Application rectally 4 (four) times daily. with Lidocaine 1.5% 30 g 0   potassium bicarbonate (K-LYTE) 25 MEQ  disintegrating tablet Take 25 mEq by mouth 2 (two) times daily. In the afternoon.     tamsulosin (FLOMAX) 0.4 MG CAPS capsule Take 1 capsule (0.4 mg total) by mouth daily after supper. 30 capsule 6   Zinc Sulfate (ZINC 15 PO) Take by mouth.     colchicine 0.6 MG tablet Take 1 tablet (0.6 mg total) by mouth 2 (two) times daily. Until flare resolves. (Patient not taking: Reported on 09/03/2023) 20 tablet 1   furosemide (LASIX) 80 MG tablet Take 40 mg by mouth daily as needed for edema. (Patient not taking: Reported on 09/03/2023)     loperamide (IMODIUM A-D) 2 MG tablet Take 2 mg by mouth 4 (four) times daily as needed for diarrhea or loose stools. (Patient not taking: Reported on 09/03/2023)     prochlorperazine (COMPAZINE) 10 MG tablet Take 1 tablet (10 mg total) by mouth every 6 (six) hours as  needed for nausea or vomiting. (Patient not taking: Reported on 09/03/2023) 60 tablet 1   tadalafil (CIALIS) 10 MG tablet Take 1-2 tablets (10-20 mg total) by mouth daily as needed for erectile dysfunction (take 45 minutes prior to secual activity). (Patient not taking: Reported on 09/03/2023) 30 tablet 6   No current facility-administered medications for this visit.   Facility-Administered Medications Ordered in Other Visits  Medication Dose Route Frequency Provider Last Rate Last Admin   heparin lock flush 100 unit/mL  500 Units Intravenous Once [name], MD       heparin lock flush 100 unit/mL  500 Units Intravenous Once [name], MD        OBJECTIVE: Vitals:   10/08/23 0906  BP: 99/69  Pulse: 93  Resp: 19  Temp: 97.6 F (36.4 C)  SpO2: 100%       Body mass index is 32.74 kg/m.    ECOG FS:1 - Symptomatic but completely ambulatory  General: Well-developed, well-nourished, no acute distress. Eyes: Pink conjunctiva, anicteric sclera. HEENT: Normocephalic, moist mucous membranes. Lungs: No audible wheezing or coughing. Heart: Regular rate and rhythm. Abdomen: Soft,  nontender, no obvious distention. Musculoskeletal: No edema, cyanosis, or clubbing. Neuro: Alert, answering all questions appropriately. Cranial nerves grossly intact. Skin: No rashes or petechiae noted. Psych: Normal affect.  LAB RESULTS:  Lab Results  Component Value Date   NA 138 10/08/2023   K 4.1 10/08/2023   CL 107 10/08/2023   CO2 24 10/08/2023   GLUCOSE 120 (H) 10/08/2023   BUN 16 10/08/2023   CREATININE 1.57 (H) 10/08/2023   CALCIUM 8.2 (L) 10/08/2023   PROT 6.1 (L) 10/08/2023   ALBUMIN 2.9 (L) 10/08/2023   AST 20 10/08/2023   ALT 13 10/08/2023   ALKPHOS 203 (H) 10/08/2023   BILITOT 0.7 10/08/2023   GFRNONAA 48 (L) 10/08/2023   GFRAA >60 07/29/2020    Lab Results  Component Value Date   WBC 6.7 10/08/2023   NEUTROABS 5.2 10/08/2023   HGB 8.0 (L) 10/08/2023   HCT 26.1 (L) 10/08/2023   MCV 95.6 10/08/2023   PLT 95 (L) 10/08/2023   Lab Results  Component Value Date   IRON 79 08/13/2023   TIBC 234 (L) 08/13/2023   IRONPCTSAT 34 08/13/2023   Lab Results  Component Value Date   FERRITIN 686 (H) 09/03/2023     STUDIES: No results found.  ONCOLOGY HISTORY: Patient completed cycle 9 of adjuvant FOLFOX on June 03, 2020.  Given his difficulties with treatment and declining performance status, treatment was discontinued altogether. Colonoscopy on September 03, 2022 did not reveal any significant pathology and recommendation was to repeat in 3 years.    ASSESSMENT: Stage IIIc colon cancer, smoldering myeloma, prostate cancer.  PLAN:    Stage IIIc colon cancer:  CT scan from December 07, 2022 reviewed independently with enlarging aortocaval node now measuring 1.9 x 1.4 cm.  PET scan results from Mar 15, 2023 reviewed independently with progressive retroperitoneal lymphadenopathy presumably related to patient's colon cancer.  Patient CEA was initially trending down, but recently has trended up to 175.  Today's result is pending.  Repeat PET scan on August 15, 2023  reviewed independently with significant improvement of patient's disease burden.  Delay cycle 9 of treatment today.  Treatment was previously dose reduced 10% given his renal insufficiency and baseline pancytopenia.  Will hold Avastin at this time given his recent stroke.  Return to clinic on Thursday for 1 unit of packed red  blood cells and then in 1 week for further evaluation and reconsideration of cycle 9.   Prostate cancer: Gleason's 3+4.  Patient's PSA increased and peaked at 39.34, but since completing treatment has decreased and is now 8.26.  Nuclear medicine bone scan on December 13, 2022 was reported as negative.  PSMA PET per radiation oncology from Mar 21, 2023 did not reveal metastatic disease.  Patient completed XRT for local control disease on May 30, 2023.  Repeat imaging as above.  Continue follow-up with urology as indicated. Smoldering myeloma: Chronic and unchanged.  Patient was initially diagnosed and treated in [place], [place] and treated with single agent Velcade in approximately 2013.  He declined maintenance treatment or referral for bone marrow transplant.  His M spike has ranged from 1.1-2.0 since July 2020.  His most recent result was 1.6.  Over the same timeframe his IgG component has ranged from [phone].  His most recent result is 2344.  Finally, his kappa free light chains have ranged from 24.8 -75.3 with his most recent result reported at 36.2.  Nuclear med bone scan as above.  His most recent bone marrow biopsy completed on May 28, 2019 revealed only 10 to 15% plasma cells with no clonality reported.  Cytogenetics were also reported as normal.  Continue to monitor closely.  Anemia: Hemoglobin has trended down to 8.0 and patient is symptomatic.  Return to clinic Thursday for 1 unit packed red blood cells.   Neutropenia: Resolved.  Continue Udenyca with pump removal.  Dose reduced FOLFIRI as above. Thrombocytopenia: Mild.  Patient's platelet count is 95 today.  Delay  treatment as above.  Renal insufficiency: Chronic and unchanged.  Patient's most recent creatinine is 1.57.  Neither irinotecan or 5-FU need to be dose reduced in the setting of renal insufficiency.  Appreciate nephrology input. Hypomagnesia: Improved.  Patient's magnesium is 1.7 today.  Monitor. Peripheral neuropathy: Chronic and unchanged.  Patient reports gabapentin did not help.  He was recently initiated on Lyrica 50 mg daily and given a referral to neurology.   Hypotension: Resolved. Coping/anxiety: Patient was previously given a referral to [facility]. Peripheral edema: Nephrology as above.  Patient was also given a referral back to cardiology.  Continue compression stockings as directed.  Patient expressed understanding and was in agreement with this plan. He also understands that He can call clinic at any time with any questions, concerns, or complaints.    [name], MD   10/08/2023 10:01 AM

## 2023-10-08 NOTE — Telephone Encounter (Signed)
Contacted pt via telephone. Spoke with pt about POC and upcoming visits. Pt would like to d/c SLP services pending completion of chemo. Pt has missed several appointments due to complications of chemo tx. Pt understands that he will need a new order to resume SLP services. Pt d/c'd from SLP services at this time.   Clyde Canterbury, M.S., CCC-SLP Speech-Language Pathologist [facility] [phone] (ASCOM)

## 2023-10-09 DIAGNOSIS — F320 Major depressive disorder, single episode, mild: Secondary | ICD-10-CM | POA: Diagnosis not present

## 2023-10-09 DIAGNOSIS — C184 Malignant neoplasm of transverse colon: Secondary | ICD-10-CM | POA: Diagnosis not present

## 2023-10-09 LAB — CEA: CEA: 172 ng/mL — ABNORMAL HIGH (ref 0.0–4.7)

## 2023-10-10 ENCOUNTER — Ambulatory Visit: Payer: Medicare HMO | Admitting: Radiation Oncology

## 2023-10-10 ENCOUNTER — Inpatient Hospital Stay: Payer: Medicare HMO

## 2023-10-10 ENCOUNTER — Ambulatory Visit: Payer: Medicare HMO

## 2023-10-10 DIAGNOSIS — Z5111 Encounter for antineoplastic chemotherapy: Secondary | ICD-10-CM | POA: Diagnosis not present

## 2023-10-10 DIAGNOSIS — D649 Anemia, unspecified: Secondary | ICD-10-CM

## 2023-10-10 MED ORDER — DIPHENHYDRAMINE HCL 50 MG/ML IJ SOLN: 25.0000 mg | Freq: Once | INTRAMUSCULAR | Status: DC

## 2023-10-10 MED ORDER — ACETAMINOPHEN 325 MG PO TABS: 650.0000 mg | ORAL_TABLET | Freq: Once | ORAL | Status: DC

## 2023-10-10 MED ORDER — SODIUM CHLORIDE 0.9% IV SOLUTION
250.0000 mL | INTRAVENOUS | Status: DC
  Administered 2023-10-10: 100 mL via INTRAVENOUS
  Filled 2023-10-10: qty 250

## 2023-10-10 MED ORDER — ACETAMINOPHEN 650 MG/20.3ML PO SUSP
650.0000 mg | Freq: Once | ORAL | Status: AC
  Administered 2023-10-10: 650 mg via ORAL
  Filled 2023-10-10: qty 20.3

## 2023-10-10 MED ORDER — HEPARIN SOD (PORK) LOCK FLUSH 100 UNIT/ML IV SOLN
500.0000 [IU] | Freq: Every day | INTRAVENOUS | Status: DC | PRN
Start: 2023-10-10 — End: 2023-10-10
  Filled 2023-10-10: qty 5

## 2023-10-11 LAB — BPAM RBC
Blood Product Expiration Date: 202412172359
ISSUE DATE / TIME: 202412051157
Unit Type and Rh: 5100

## 2023-10-11 LAB — TYPE AND SCREEN
ABO/RH(D): O POS
Antibody Screen: NEGATIVE
Unit division: 0

## 2023-10-14 ENCOUNTER — Other Ambulatory Visit: Payer: Self-pay | Admitting: *Deleted

## 2023-10-14 DIAGNOSIS — C61 Malignant neoplasm of prostate: Secondary | ICD-10-CM

## 2023-10-15 ENCOUNTER — Encounter: Payer: Self-pay | Admitting: Radiation Oncology

## 2023-10-15 ENCOUNTER — Other Ambulatory Visit: Payer: Self-pay | Admitting: *Deleted

## 2023-10-15 ENCOUNTER — Ambulatory Visit: Payer: Medicare HMO

## 2023-10-15 ENCOUNTER — Inpatient Hospital Stay: Payer: Medicare HMO

## 2023-10-15 ENCOUNTER — Ambulatory Visit
Admission: RE | Admit: 2023-10-15 | Discharge: 2023-10-15 | Disposition: A | Discharge status: Home / Self Care | Payer: Medicare HMO | Source: Ambulatory Visit | Attending: Radiation Oncology | Admitting: Radiation Oncology

## 2023-10-15 ENCOUNTER — Inpatient Hospital Stay (HOSPITAL_BASED_OUTPATIENT_CLINIC_OR_DEPARTMENT_OTHER): Payer: Medicare HMO | Admitting: Oncology

## 2023-10-15 ENCOUNTER — Encounter: Payer: Self-pay | Admitting: Oncology

## 2023-10-15 VITALS — BP 95/60 | HR 65 | Temp 98.0°F | Resp 18 | Ht 69.0 in | Wt 226.0 lb

## 2023-10-15 VITALS — BP 95/60 | HR 65 | Temp 98.0°F | Resp 18 | Ht 69.0 in | Wt 226.8 lb

## 2023-10-15 DIAGNOSIS — C61 Malignant neoplasm of prostate: Secondary | ICD-10-CM

## 2023-10-15 DIAGNOSIS — C184 Malignant neoplasm of transverse colon: Secondary | ICD-10-CM

## 2023-10-15 DIAGNOSIS — Z923 Personal history of irradiation: Secondary | ICD-10-CM | POA: Insufficient documentation

## 2023-10-15 DIAGNOSIS — R351 Nocturia: Secondary | ICD-10-CM | POA: Diagnosis not present

## 2023-10-15 DIAGNOSIS — M7989 Other specified soft tissue disorders: Secondary | ICD-10-CM | POA: Diagnosis not present

## 2023-10-15 DIAGNOSIS — Z5111 Encounter for antineoplastic chemotherapy: Secondary | ICD-10-CM | POA: Diagnosis not present

## 2023-10-15 LAB — CBC WITH DIFFERENTIAL (CANCER CENTER ONLY)
Abs Immature Granulocytes: 0.02 10*3/uL (ref 0.00–0.07)
Basophils Absolute: 0 10*3/uL (ref 0.0–0.1)
Basophils Relative: 0 %
Eosinophils Absolute: 0.1 10*3/uL (ref 0.0–0.5)
Eosinophils Relative: 2 %
HCT: 25.6 % — ABNORMAL LOW (ref 39.0–52.0)
Hemoglobin: 8 g/dL — ABNORMAL LOW (ref 13.0–17.0)
Immature Granulocytes: 1 %
Lymphocytes Relative: 8 %
Lymphs Abs: 0.4 10*3/uL — ABNORMAL LOW (ref 0.7–4.0)
MCH: 29.2 pg (ref 26.0–34.0)
MCHC: 31.3 g/dL (ref 30.0–36.0)
MCV: 93.4 fL (ref 80.0–100.0)
Monocytes Absolute: 0.7 10*3/uL (ref 0.1–1.0)
Monocytes Relative: 16 %
Neutro Abs: 3.3 10*3/uL (ref 1.7–7.7)
Neutrophils Relative %: 73 %
Platelet Count: 97 10*3/uL — ABNORMAL LOW (ref 150–400)
RBC: 2.74 MIL/uL — ABNORMAL LOW (ref 4.22–5.81)
RDW: 16.9 % — ABNORMAL HIGH (ref 11.5–15.5)
WBC Count: 4.4 10*3/uL (ref 4.0–10.5)
nRBC: 0 % (ref 0.0–0.2)

## 2023-10-15 LAB — CMP (CANCER CENTER ONLY)
ALT: 12 U/L (ref 0–44)
AST: 21 U/L (ref 15–41)
Albumin: 3.1 g/dL — ABNORMAL LOW (ref 3.5–5.0)
Alkaline Phosphatase: 172 U/L — ABNORMAL HIGH (ref 38–126)
Anion gap: 6 (ref 5–15)
BUN: 19 mg/dL (ref 8–23)
CO2: 26 mmol/L (ref 22–32)
Calcium: 8.4 mg/dL — ABNORMAL LOW (ref 8.9–10.3)
Chloride: 106 mmol/L (ref 98–111)
Creatinine: 1.72 mg/dL — ABNORMAL HIGH (ref 0.61–1.24)
GFR, Estimated: 43 mL/min — ABNORMAL LOW (ref >60)
Glucose, Bld: 108 mg/dL — ABNORMAL HIGH (ref 70–99)
Potassium: 4.1 mmol/L (ref 3.5–5.1)
Sodium: 138 mmol/L (ref 135–145)
Total Bilirubin: 1 mg/dL (ref <1.2)
Total Protein: 6.5 g/dL (ref 6.5–8.1)

## 2023-10-15 LAB — SAMPLE TO BLOOD BANK

## 2023-10-15 LAB — MAGNESIUM: Magnesium: 1.5 mg/dL — ABNORMAL LOW (ref 1.7–2.4)

## 2023-10-15 LAB — PSA: Prostatic Specific Antigen: 4.65 ng/mL — ABNORMAL HIGH (ref 0.00–4.00)

## 2023-10-15 MED ORDER — SODIUM CHLORIDE 0.9 % IV SOLN
Freq: Once | INTRAVENOUS | Status: AC
Start: 2023-10-15 — End: 2023-10-15
  Filled 2023-10-15: qty 250

## 2023-10-15 MED ORDER — SODIUM CHLORIDE 0.9 % IV SOLN
2160.0000 mg/m2 | INTRAVENOUS | Status: DC
  Administered 2023-10-15: 5000 mg via INTRAVENOUS
  Filled 2023-10-15: qty 100

## 2023-10-15 MED ORDER — FLUOROURACIL CHEMO INJECTION 2.5 GM/50ML
360.0000 mg/m2 | Freq: Once | INTRAVENOUS | Status: AC
Start: 2023-10-15 — End: 2023-10-15
  Administered 2023-10-15: 800 mg via INTRAVENOUS
  Filled 2023-10-15: qty 16

## 2023-10-15 MED ORDER — DEXAMETHASONE SODIUM PHOSPHATE 10 MG/ML IJ SOLN
10.0000 mg | Freq: Once | INTRAMUSCULAR | Status: AC
  Administered 2023-10-15: 10 mg via INTRAVENOUS
  Filled 2023-10-15: qty 1

## 2023-10-15 MED ORDER — ATROPINE SULFATE 1 MG/ML IV SOLN
0.5000 mg | Freq: Once | INTRAVENOUS | Status: AC | PRN
  Administered 2023-10-15: 0.5 mg via INTRAVENOUS

## 2023-10-15 MED ORDER — SODIUM CHLORIDE 0.9 % IV SOLN
160.0000 mg/m2 | Freq: Once | INTRAVENOUS | Status: AC
  Administered 2023-10-15: 340 mg via INTRAVENOUS
  Filled 2023-10-15: qty 17

## 2023-10-15 MED ORDER — PALONOSETRON HCL INJECTION 0.25 MG/5ML
0.2500 mg | Freq: Once | INTRAVENOUS | Status: AC
  Administered 2023-10-15: 0.25 mg via INTRAVENOUS
  Filled 2023-10-15: qty 5

## 2023-10-15 MED ORDER — SODIUM CHLORIDE 0.9 % IV SOLN
360.0000 mg/m2 | Freq: Once | INTRAVENOUS | Status: AC
  Administered 2023-10-15: 782 mg via INTRAVENOUS
  Filled 2023-10-15: qty 39.1

## 2023-10-15 NOTE — Progress Notes (Signed)
Nutrition Follow-up:  Patient with recurrent stage III colon cancer, smoldering myeloma, prostate cancer.  Patient receiving folfiri  Met with patient during infusion.  Reports that he has not been eating as much recently due to not feeling good.  Usually eats 1 big meal a day.  This morning ate egg, bacon and cheese biscuit and coffee.  Thinks he received the information on protein rich foods but has not had a chance to review it.      Medications: reviewed  Labs: reviewed  Anthropometrics:   Weight 226 lb  232 lb 3.2 oz on 11/9 (fluid) 214 lb on 10/29 217 lb on 8/10 220 lb on 5/13   NUTRITION DIAGNOSIS: Inadequate oral intake ongoing    INTERVENTION:  Reviewed foods rich in protein Encouraged lean protein source at every meal.     MONITORING, EVALUATION, GOAL: weight trends, intake   NEXT VISIT: Jan 14 during infusion (Tuesday)  [place] Lapaglia B. Freida Busman, RD, LDN Registered Dietitian [phone]

## 2023-10-15 NOTE — Progress Notes (Signed)
Chris George  [phone] [phone]  ID: Chris George OB: 04-17-1956  MR#: [id number]  WUX#:[id number]  Patient Care Team: Marina Goodell, MD as PCP - General (Family Medicine) [name], MD as Consulting Physician (Oncology) Morene Crocker, MD as Referring Physician (Neurology) Mady Haagensen, MD as Consulting Physician (Nephrology)  CHIEF COMPLAINT: Recurrent colon cancer, smoldering myeloma, prostate cancer.  INTERVAL HISTORY: Patient returns to clinic today for further evaluation and reconsideration of cycle 9 of FOLFIRI.  He continues to have chronic weakness and fatigue, but otherwise feels well.  He has a mild expressive aphasia and a chronic peripheral neuropathy, but no other neurologic complaints. He denies any recent fevers or illnesses.  He has a fair appetite and denies weight loss.  He has no chest pain, shortness of breath, cough, or hemoptysis.  He denies any nausea, vomiting, constipation, or diarrhea.  He has no melena or hematochezia.  He has no urinary complaints.  Patient offers no further specific complaints today.  REVIEW OF SYSTEMS:   Review of Systems  Constitutional:  Positive for malaise/fatigue. Negative for fever and weight loss.  Respiratory: Negative.  Negative for cough, hemoptysis and shortness of breath.   Cardiovascular:  Positive for leg swelling. Negative for chest pain.  Gastrointestinal: Negative.  Negative for blood in stool, diarrhea and melena.  Genitourinary: Negative.  Negative for hematuria.  Musculoskeletal: Negative.  Negative for back pain and joint pain.  Skin: Negative.  Negative for rash.  Neurological:  Positive for tingling, sensory change and weakness. Negative for dizziness, focal weakness and headaches.  Psychiatric/Behavioral: Negative.  Negative for depression. The patient is not nervous/anxious.     As per HPI. Otherwise, a complete review of systems is negative.  PAST  MEDICAL HISTORY: Past Medical History:  Diagnosis Date   A-fib (HCC)    Anemia    Arthritis    ankles   Cancer (HCC)    multiple myeloma   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HCC) 10/26/2019   Cardiomyopathy (HCC)    Chemotherapy-induced peripheral neuropathy (HCC)    CHF (congestive heart failure) (HCC)    Depression    Dysrhythmia    atrial fib   History of CVA (cerebrovascular accident)    HLD (hyperlipidemia)    Hypercholesteremia    Hypertension    Moderate tricuspid insufficiency    Multiple myeloma (HCC)    not being treated right now per pt 11/11/19   Pneumonia    Sleep apnea    No CPAP.  OSA resolved with wt loss.    PAST SURGICAL HISTORY: Past Surgical History:  Procedure Laterality Date   ATRIAL ABLATION SURGERY     CARDIAC SURGERY     A-Fib Ablations   COLONOSCOPY WITH PROPOFOL N/A 10/26/2019   Procedure: COLONOSCOPY WITH BIOPSY;  Surgeon: [name], MD;  Location: [facility];  Service: Endoscopy;  Laterality: N/A;   COLONOSCOPY WITH PROPOFOL N/A 09/03/2022   Procedure: COLONOSCOPY WITH PROPOFOL;  Surgeon: [name], MD;  Location: [facility];  Service: Endoscopy;  Laterality: N/A;   ESOPHAGOGASTRODUODENOSCOPY (EGD) WITH PROPOFOL N/A 10/26/2019   Procedure: ESOPHAGOGASTRODUODENOSCOPY (EGD) WITH BIOPSY;  Surgeon: [name], MD;  Location: [facility];  Service: Endoscopy;  Laterality: N/A;  sleep apnea   LAPAROSCOPIC PARTIAL COLECTOMY Right 11/17/2019   Procedure: LAPAROSCOPIC PARTIAL COLECTOMY RIGHT EXTENDED;  Surgeon: [name], MD;  Location: [facility];  Service: General;  Laterality: Right;   PORTACATH PLACEMENT N/A 12/28/2019   Procedure:  INSERTION PORT-A-CATH Left subclavian;  Surgeon: [name], MD;  Location: [facility];  Service: General;  Laterality: N/A;    FAMILY HISTORY: Family History  Problem Relation Age of Onset   Healthy Mother     ADVANCED DIRECTIVES (Y/N):  N  HEALTH MAINTENANCE: Social History    Tobacco Use   Smoking status: Never    Passive exposure: Never   Smokeless tobacco: Never  Vaping Use   Vaping status: Never Used  Substance Use Topics   Alcohol use: Not Currently   Drug use: Never     Colonoscopy:  PAP:  Bone density:  Lipid panel:  No Known Allergies  Current Outpatient Medications  Medication Sig Dispense Refill   allopurinol (ZYLOPRIM) 100 MG tablet Take 200 mg by mouth daily.     apixaban (ELIQUIS) 5 MG TABS tablet Take 5 mg by mouth 2 (two) times daily.     APPLE CIDER VINEGAR PO Take 30-45 mLs by mouth every other day.      atorvastatin (LIPITOR) 80 MG tablet Take 80 mg by mouth daily.     colchicine 0.6 MG tablet Take 1 tablet (0.6 mg total) by mouth 2 (two) times daily. Until flare resolves. (Patient not taking: Reported on 09/03/2023) 20 tablet 1   dicyclomine (BENTYL) 20 MG tablet Take by mouth.     furosemide (LASIX) 20 MG tablet Take by mouth.     furosemide (LASIX) 80 MG tablet Take 40 mg by mouth daily as needed for edema. (Patient not taking: Reported on 09/03/2023)     gabapentin (NEURONTIN) 100 MG capsule Take 300 mg by mouth 2 (two) times daily.     HYDROcodone-acetaminophen (NORCO) 5-325 MG tablet Take 1 tablet by mouth every 6 (six) hours as needed for moderate pain. 30 tablet 0   hydrocortisone (ANUSOL-HC) 2.5 % rectal cream Place 1 Application rectally 2 (two) times daily. 30 g 0   hydrocortisone cream 1 % Apply 1 Application topically 2 (two) times daily.     Lidocaine, Anorectal, 5 % GEL Apply 1 Application topically as needed. 30 g 1   lidocaine-prilocaine (EMLA) cream Apply 1 Application topically daily.     loperamide (IMODIUM A-D) 2 MG tablet Take 2 mg by mouth 4 (four) times daily as needed for diarrhea or loose stools. (Patient not taking: Reported on 09/03/2023)     magnesium chloride (SLOW-MAG) 64 MG TBEC SR tablet Take 1 tablet (64 mg total) by mouth daily. 60 tablet 0   MAGNESIUM PO Take by mouth.     mirtazapine (REMERON)  7.5 MG tablet Take 1 tablet (7.5 mg total) by mouth at bedtime. 30 tablet 3   Misc Natural Products (TART CHERRY ADVANCED PO) Take 60 mLs by mouth every other day.     Multiple Vitamins-Minerals (ZINC PO) Take by mouth.     nifedipine 0.3 % ointment Place 1 Application rectally 4 (four) times daily. with Lidocaine 1.5% 30 g 0   potassium bicarbonate (K-LYTE) 25 MEQ disintegrating tablet Take 25 mEq by mouth 2 (two) times daily. In the afternoon.     prochlorperazine (COMPAZINE) 10 MG tablet Take 1 tablet (10 mg total) by mouth every 6 (six) hours as needed for nausea or vomiting. (Patient not taking: Reported on 09/03/2023) 60 tablet 1   tadalafil (CIALIS) 10 MG tablet Take 1-2 tablets (10-20 mg total) by mouth daily as needed for erectile dysfunction (take 45 minutes prior to secual activity). (Patient not taking: Reported on 09/03/2023) 30 tablet 6  tamsulosin (FLOMAX) 0.4 MG CAPS capsule Take 1 capsule (0.4 mg total) by mouth daily after supper. 30 capsule 6   Zinc Sulfate (ZINC 15 PO) Take by mouth.     No current facility-administered medications for this visit.   Facility-Administered Medications Ordered in Other Visits  Medication Dose Route Frequency Provider Last Rate Last Admin   heparin lock flush 100 unit/mL  500 Units Intravenous Once [name], MD        OBJECTIVE: Vitals:   10/15/23 1006  BP: 95/60  Pulse: 65  Resp: 18  Temp: 98 F (36.7 C)       Body mass index is 33.37 kg/m.    ECOG FS:1 - Symptomatic but completely ambulatory  General: Well-developed, well-nourished, no acute distress. Eyes: Pink conjunctiva, anicteric sclera. HEENT: Normocephalic, moist mucous membranes. Lungs: No audible wheezing or coughing. Heart: Regular rate and rhythm. Abdomen: Soft, nontender, no obvious distention. Musculoskeletal: No edema, cyanosis, or clubbing. Neuro: Alert, answering all questions appropriately. Cranial nerves grossly intact. Skin: No rashes or petechiae  noted. Psych: Normal affect.  LAB RESULTS:  Lab Results  Component Value Date   NA 138 10/08/2023   K 4.1 10/08/2023   CL 107 10/08/2023   CO2 24 10/08/2023   GLUCOSE 120 (H) 10/08/2023   BUN 16 10/08/2023   CREATININE 1.57 (H) 10/08/2023   CALCIUM 8.2 (L) 10/08/2023   PROT 6.1 (L) 10/08/2023   ALBUMIN 2.9 (L) 10/08/2023   AST 20 10/08/2023   ALT 13 10/08/2023   ALKPHOS 203 (H) 10/08/2023   BILITOT 0.7 10/08/2023   GFRNONAA 48 (L) 10/08/2023   GFRAA >60 07/29/2020    Lab Results  Component Value Date   WBC 6.7 10/08/2023   NEUTROABS 5.2 10/08/2023   HGB 8.0 (L) 10/08/2023   HCT 26.1 (L) 10/08/2023   MCV 95.6 10/08/2023   PLT 95 (L) 10/08/2023   Lab Results  Component Value Date   IRON 79 08/13/2023   TIBC 234 (L) 08/13/2023   IRONPCTSAT 34 08/13/2023   Lab Results  Component Value Date   FERRITIN 686 (H) 09/03/2023     STUDIES: No results found.  ONCOLOGY HISTORY: Patient completed cycle 9 of adjuvant FOLFOX on June 03, 2020.  Given his difficulties with treatment and declining performance status, treatment was discontinued altogether. Colonoscopy on September 03, 2022 did not reveal any significant pathology and recommendation was to repeat in 3 years.    ASSESSMENT: Stage IIIc colon cancer, smoldering myeloma, prostate cancer.  PLAN:    Stage IIIc colon cancer:  CT scan from December 07, 2022 reviewed independently with enlarging aortocaval node now measuring 1.9 x 1.4 cm.  PET scan results from Mar 15, 2023 reviewed independently with progressive retroperitoneal lymphadenopathy presumably related to patient's colon cancer.  Patient CEA was initially trending down, but recently has trended up to 172.  Today's result is pending.  Repeat PET scan on August 15, 2023 reviewed independently with significant improvement of patient's disease burden.  Proceed with cycle 9 of treatment today.  Treatment was previously dose reduced 10% given his renal insufficiency and  baseline pancytopenia.  Will hold Avastin at this time given his recent stroke.  Return to clinic in 2 days for pump removal and then in 3 weeks for further evaluation and consideration of cycle 10.   Prostate cancer: Gleason's 3+4.  Patient's PSA increased and peaked at 39.34, but since completing treatment has decreased and is now 4.24.  Nuclear medicine bone scan on  December 13, 2022 was reported as negative.  PSMA PET per radiation oncology from Mar 21, 2023 did not reveal metastatic disease.  Patient completed XRT for local control disease on May 30, 2023.  Repeat imaging as above.  Can consider Eligard in the future.  Continue follow-up with urology as indicated. Smoldering myeloma: Chronic and unchanged.  Patient was initially diagnosed and treated in [place], [place] and treated with single agent Velcade in approximately 2013.  He declined maintenance treatment or referral for bone marrow transplant.  His M spike has ranged from 1.1-2.0 since July 2020.  His most recent result was 1.6.  Over the same timeframe his IgG component has ranged from [phone].  His most recent result is 2344.  Finally, his kappa free light chains have ranged from 24.8 -75.3 with his most recent result reported at 36.2.  Nuclear med bone scan as above.  His most recent bone marrow biopsy completed on May 28, 2019 revealed only 10 to 15% plasma cells with no clonality reported.  Cytogenetics were also reported as normal.  Continue to monitor closely.  Anemia: Chronic and unchanged.  Patient's hemoglobin is 8.0.  If continues to decrease can consider repeat transfusion in the near future.   Neutropenia: Resolved.  Continue Udenyca with pump removal.  Dose reduced FOLFIRI as above. Thrombocytopenia: Mild.  Patient's platelet count is 97 today.  Proceed with treatment cautiously as above.  Renal insufficiency: Creatinine slightly worse at 1.72 today.  Monitor.  Neither irinotecan or 5-FU need to be dose reduced in the setting  of renal insufficiency.  Appreciate nephrology input. Hypomagnesia: Magnesium has trended down slightly to 1.5.  Monitor. Peripheral neuropathy: Chronic and unchanged.  Patient reports gabapentin did not help.  He was recently initiated on Lyrica 50 mg daily and given a referral to neurology.   Hypotension: Resolved. Coping/anxiety: Patient was previously given a referral to [facility].  Patient reports he talks to them approximately once per week. Peripheral edema: Nephrology as above.  Patient was also given a referral back to cardiology.  Continue compression stockings as directed.  Patient expressed understanding and was in agreement with this plan. He also understands that He can call clinic at any time with any questions, concerns, or complaints.    [name], MD   10/15/2023 10:22 AM

## 2023-10-15 NOTE — Progress Notes (Signed)
Radiation Oncology Follow up Note  Name: Chris George   Date:   10/15/2023 MRN:  [id number] DOB: 1956-08-10    This 67 y.o. male presents to the clinic today for 49-month follow-up status post IMRT radiation therapy for Gleason 7 adenocarcinoma the prostate presenting with a PSA of 40 and patient with known locally advanced adenocarcinoma of the rectum.  Patient also has a history of smoldering myeloma.  REFERRING PROVIDER: Marina Goodell, MD  HPI: Patient is a 67 year old male with history of locally advanced adenocarcinoma the colon.  He received SBRT treatment for intra-abdominal lymph nodes.  He also is now out for months having completed IMRT radiation therapy for Gleason 7 adenocarcinoma the prostate presenting with a PSA of 40.  His most recent PSA is 4.2..  Patient completed in July cycle 9 of FOLFOX chemotherapy.  Patient does have renal insufficiency and baseline pancytopenia.  His major complaint now is swelling his lower extremities he does have nocturia x 4-5 although is on multiple diuretics.  He is having no bone pain.  Patient did have a PET CT scan back in October which I reviewed showing status post right hemicolectomy improving retroperitoneal nodal metastasis.  COMPLICATIONS OF TREATMENT: none  FOLLOW UP COMPLIANCE: keeps appointments   PHYSICAL EXAM:  BP 95/60   Pulse 65   Temp 98 F (36.7 C)   Resp 18   Ht 5\' 9"  (1.753 m)   Wt 226 lb 12.8 oz (102.9 kg)   BMI 33.49 kg/m  Patient does have bilateral lower extremity edema.  Lungs are clear cardiac examination essentially unremarkable abdomen is benign.  RADIOLOGY RESULTS: PET CT scan reviewed compatible with above-stated findings  PLAN: This time patient is clinically doing fairly well of explained to him most of his urinary frequency may be related to his diuretics.  He is seeing Dr. Orlie Dakin today to address some of his medical issues.  I am going to asked Dr. Orlie Dakin to maybe start him on Eligard based on the  residual PSA of over 4.  I have asked to see him back in 6 months for follow-up.  Patient knows to call with any concerns.  I would like to take this opportunity to thank you for allowing me to participate in the care of your patient.Carmina Miller, MD

## 2023-10-15 NOTE — Patient Instructions (Signed)
[facility] - A DEPT OF [facility]  Discharge Instructions: Thank you for choosing [facility] to provide your oncology and hematology care.  If you have a lab appointment with the Cancer Center, please go directly to the Cancer Center and check in at the registration area.  Wear comfortable clothing and clothing appropriate for easy access to any Portacath or PICC line.   We strive to give you quality time with your provider. You may need to reschedule your appointment if you arrive late (15 or more minutes).  Arriving late affects you and other patients whose appointments are after yours.  Also, if you miss three or more appointments without notifying the office, you may be dismissed from the clinic at the provider's discretion.      For prescription refill requests, have your pharmacy contact our office and allow 72 hours for refills to be completed.    To help prevent nausea and vomiting after your treatment, we encourage you to take your nausea medication as directed.  BELOW ARE SYMPTOMS THAT SHOULD BE REPORTED IMMEDIATELY: *FEVER GREATER THAN 100.4 F (38 C) OR HIGHER *CHILLS OR SWEATING *NAUSEA AND VOMITING THAT IS NOT CONTROLLED WITH YOUR NAUSEA MEDICATION *UNUSUAL SHORTNESS OF BREATH *UNUSUAL BRUISING OR BLEEDING *URINARY PROBLEMS (pain or burning when urinating, or frequent urination) *BOWEL PROBLEMS (unusual diarrhea, constipation, pain near the anus) TENDERNESS IN MOUTH AND THROAT WITH OR WITHOUT PRESENCE OF ULCERS (sore throat, sores in mouth, or a toothache) UNUSUAL RASH, SWELLING OR PAIN   Items with * indicate a potential emergency and should be followed up as soon as possible or go to the Emergency Department if any problems should occur.  Please show the CHEMOTHERAPY ALERT CARD or IMMUNOTHERAPY ALERT CARD at check-in to the Emergency Department and triage nurse.  Should you have questions after your visit or need to cancel  or reschedule your appointment, please contact [facility] - A DEPT OF [facility]  [phone] and follow the prompts.  Office hours are 8:00 a.m. to 4:30 p.m. Monday - Friday. Please note that voicemails left after 4:00 p.m. may not be returned until the following business day.  We are closed weekends and major holidays. You have access to a nurse at all times for urgent questions. Please call the main number to the clinic [phone] and follow the prompts.  For any non-urgent questions, you may also contact your provider using MyChart. We now offer e-Visits for anyone 33 and older to request care online for non-urgent symptoms. For details visit [email or web address].   Also download the MyChart app! Go to the app store, search "MyChart", open the app, select [facility], and log in with your MyChart username and password.

## 2023-10-16 ENCOUNTER — Other Ambulatory Visit: Payer: Medicare HMO

## 2023-10-16 ENCOUNTER — Ambulatory Visit: Payer: Medicare HMO

## 2023-10-16 ENCOUNTER — Ambulatory Visit: Payer: Medicare HMO | Admitting: Oncology

## 2023-10-16 ENCOUNTER — Other Ambulatory Visit: Payer: Self-pay

## 2023-10-16 LAB — CEA: CEA: 180 ng/mL — ABNORMAL HIGH (ref 0.0–4.7)

## 2023-10-17 ENCOUNTER — Inpatient Hospital Stay: Payer: Medicare HMO

## 2023-10-17 VITALS — BP 119/86 | HR 92 | Temp 96.7°F | Resp 18

## 2023-10-17 DIAGNOSIS — Z5111 Encounter for antineoplastic chemotherapy: Secondary | ICD-10-CM | POA: Diagnosis not present

## 2023-10-17 DIAGNOSIS — C184 Malignant neoplasm of transverse colon: Secondary | ICD-10-CM

## 2023-10-17 MED ORDER — PEGFILGRASTIM-JMDB 6 MG/0.6ML ~~LOC~~ SOSY: 6.0000 mg | PREFILLED_SYRINGE | Freq: Once | SUBCUTANEOUS | Status: DC

## 2023-10-17 MED ORDER — SODIUM CHLORIDE 0.9% FLUSH
10.0000 mL | INTRAVENOUS | Status: DC | PRN
  Administered 2023-10-17: 10 mL
  Filled 2023-10-17: qty 10

## 2023-10-17 MED ORDER — HEPARIN SOD (PORK) LOCK FLUSH 100 UNIT/ML IV SOLN
500.0000 [IU] | Freq: Once | INTRAVENOUS | Status: AC | PRN
Start: 2023-10-17 — End: 2023-10-17
  Administered 2023-10-17: 500 [IU]
  Filled 2023-10-17: qty 5

## 2023-10-17 NOTE — Progress Notes (Signed)
Pt here for pump dc and fulphilia shot- pt does not want shot as ho how it makes him feel.  States he has discussed It with MD.  Dr Orlie Dakin aware and ok with pt not receiving shot.

## 2023-10-22 ENCOUNTER — Ambulatory Visit: Payer: Medicare HMO

## 2023-10-23 ENCOUNTER — Other Ambulatory Visit: Payer: Self-pay

## 2023-10-23 ENCOUNTER — Emergency Department
Admission: EM | Admit: 2023-10-23 | Discharge: 2023-10-23 | Disposition: A | Discharge status: Home / Self Care | Payer: Medicare HMO | Attending: Emergency Medicine | Admitting: Emergency Medicine

## 2023-10-23 ENCOUNTER — Emergency Department: Payer: Medicare HMO

## 2023-10-23 DIAGNOSIS — I509 Heart failure, unspecified: Secondary | ICD-10-CM | POA: Diagnosis not present

## 2023-10-23 DIAGNOSIS — N189 Chronic kidney disease, unspecified: Secondary | ICD-10-CM | POA: Insufficient documentation

## 2023-10-23 DIAGNOSIS — R531 Weakness: Secondary | ICD-10-CM | POA: Insufficient documentation

## 2023-10-23 DIAGNOSIS — Z8546 Personal history of malignant neoplasm of prostate: Secondary | ICD-10-CM | POA: Diagnosis not present

## 2023-10-23 DIAGNOSIS — Z8579 Personal history of other malignant neoplasms of lymphoid, hematopoietic and related tissues: Secondary | ICD-10-CM | POA: Diagnosis not present

## 2023-10-23 DIAGNOSIS — F5101 Primary insomnia: Secondary | ICD-10-CM | POA: Insufficient documentation

## 2023-10-23 LAB — COMPREHENSIVE METABOLIC PANEL
ALT: 17 U/L (ref 0–44)
AST: 20 U/L (ref 15–41)
Albumin: 3.2 g/dL — ABNORMAL LOW (ref 3.5–5.0)
Alkaline Phosphatase: 192 U/L — ABNORMAL HIGH (ref 38–126)
Anion gap: 8 (ref 5–15)
BUN: 30 mg/dL — ABNORMAL HIGH (ref 8–23)
CO2: 27 mmol/L (ref 22–32)
Calcium: 8.7 mg/dL — ABNORMAL LOW (ref 8.9–10.3)
Chloride: 100 mmol/L (ref 98–111)
Creatinine, Ser: 1.74 mg/dL — ABNORMAL HIGH (ref 0.61–1.24)
GFR, Estimated: 42 mL/min — ABNORMAL LOW (ref >60)
Glucose, Bld: 115 mg/dL — ABNORMAL HIGH (ref 70–99)
Potassium: 4 mmol/L (ref 3.5–5.1)
Sodium: 135 mmol/L (ref 135–145)
Total Bilirubin: 0.9 mg/dL (ref <1.2)
Total Protein: 6.8 g/dL (ref 6.5–8.1)

## 2023-10-23 LAB — CBC WITH DIFFERENTIAL/PLATELET
Abs Immature Granulocytes: 0.01 10*3/uL (ref 0.00–0.07)
Basophils Absolute: 0 10*3/uL (ref 0.0–0.1)
Basophils Relative: 1 %
Eosinophils Absolute: 0 10*3/uL (ref 0.0–0.5)
Eosinophils Relative: 2 %
HCT: 23.9 % — ABNORMAL LOW (ref 39.0–52.0)
Hemoglobin: 7.5 g/dL — ABNORMAL LOW (ref 13.0–17.0)
Immature Granulocytes: 1 %
Lymphocytes Relative: 17 %
Lymphs Abs: 0.3 10*3/uL — ABNORMAL LOW (ref 0.7–4.0)
MCH: 29.9 pg (ref 26.0–34.0)
MCHC: 31.4 g/dL (ref 30.0–36.0)
MCV: 95.2 fL (ref 80.0–100.0)
Monocytes Absolute: 0.1 10*3/uL (ref 0.1–1.0)
Monocytes Relative: 8 %
Neutro Abs: 1.2 10*3/uL — ABNORMAL LOW (ref 1.7–7.7)
Neutrophils Relative %: 71 %
Platelets: 72 10*3/uL — ABNORMAL LOW (ref 150–400)
RBC: 2.51 MIL/uL — ABNORMAL LOW (ref 4.22–5.81)
RDW: 16.4 % — ABNORMAL HIGH (ref 11.5–15.5)
WBC: 1.7 10*3/uL — ABNORMAL LOW (ref 4.0–10.5)
nRBC: 0 % (ref 0.0–0.2)

## 2023-10-23 LAB — BRAIN NATRIURETIC PEPTIDE: B Natriuretic Peptide: 164.5 pg/mL — ABNORMAL HIGH (ref 0.0–100.0)

## 2023-10-23 LAB — TROPONIN I (HIGH SENSITIVITY): Troponin I (High Sensitivity): 53 ng/L — ABNORMAL HIGH (ref <18)

## 2023-10-23 NOTE — Discharge Instructions (Signed)
Please start taking your remeron 3 hours after the last dose of hydrocodone before bedtime

## 2023-10-23 NOTE — ED Provider Triage Note (Signed)
Emergency Medicine Provider Triage Evaluation Note  Chris George , a 66 y.o. male  was evaluated in triage.  Pt complains of weakness.  Patient reports that this has been ongoing for several months but worsened yesterday and he feels that he can hardly walk.  He is currently undergoing chemotherapy for colon cancer.  No fevers or chills.  No injuries.  No pain anywhere.  Review of Systems  Positive: Weakness Negative: pain  Physical Exam  Ht 5\' 9"  (1.753 m)   Wt 102 kg   BMI 33.21 kg/m  Gen:   Awake, no distress   Resp:  Normal effort  MSK:   Moves extremities without difficulty  Other:    Medical Decision Making  Medically screening exam initiated at 5:31 PM.  Appropriate orders placed.  Chris George was informed that the remainder of the evaluation will be completed by another provider, this initial triage assessment does not replace that evaluation, and the importance of remaining in the ED until their evaluation is complete.     Jackelyn Hoehn, PA-C 10/23/23 1733

## 2023-10-23 NOTE — ED Provider Notes (Signed)
[facility] Provider Note   Event Date/Time   First MD Initiated Contact with Patient 10/23/23 2023     (approximate) History  Weakness  HPI Chris George is a 67 y.o. male with a stated past medical history of prostate cancer, multiple myeloma, CHF, and CKD who presents complaining of generalized weakness, insomnia, and frequent nighttime urination.  Patient states that he has been having these issues since treatment for his chemotherapy and is concerned that he is getting up every "5-10 minutes to use the bathroom and not allowing him to get good sleep.  Patient now states that he is tired all the time and has generalized weakness that is kept him from doing his activities of daily living. ROS: Patient currently denies any vision changes, tinnitus, difficulty speaking, facial droop, sore throat, chest pain, shortness of breath, abdominal pain, nausea/vomiting/diarrhea, dysuria, or numbness/paresthesias in any extremity   Physical Exam  Triage Vital Signs: ED Triage Vitals  Encounter Vitals Group     BP 10/23/23 1731 (!) 101/49     Systolic BP Percentile --      Diastolic BP Percentile --      Pulse Rate 10/23/23 1731 98     Resp 10/23/23 1731 18     Temp 10/23/23 1731 98.2 F (36.8 C)     Temp Source 10/23/23 1731 Oral     SpO2 10/23/23 1731 100 %     Weight 10/23/23 1729 224 lb 13.9 oz (102 kg)     Height 10/23/23 1729 5\' 9"  (1.753 m)     Head Circumference --      Peak Flow --      Pain Score 10/23/23 1729 6     Pain Loc --      Pain Education --      Exclude from Growth Chart --    Most recent vital signs: Vitals:   10/23/23 2100 10/23/23 2134  BP: 110/82 112/87  Pulse: (!) 53 81  Resp: 11 16  Temp:    SpO2: 100% 98%   General: Awake, oriented x4. CV:  Good peripheral perfusion.  Resp:  Normal effort.  Abd:  No distention.  Other:  Elderly obese African-American male resting comfortably in no acute distress ED Results / Procedures /  Treatments  Labs (all labs ordered are listed, but only abnormal results are displayed) Labs Reviewed  COMPREHENSIVE METABOLIC PANEL - Abnormal; Notable for the following components:      Result Value   Glucose, Bld 115 (*)    BUN 30 (*)    Creatinine, Ser 1.74 (*)    Calcium 8.7 (*)    Albumin 3.2 (*)    Alkaline Phosphatase 192 (*)    GFR, Estimated 42 (*)    All other components within normal limits  CBC WITH DIFFERENTIAL/PLATELET - Abnormal; Notable for the following components:   WBC 1.7 (*)    RBC 2.51 (*)    Hemoglobin 7.5 (*)    HCT 23.9 (*)    RDW 16.4 (*)    Platelets 72 (*)    Neutro Abs 1.2 (*)    Lymphs Abs 0.3 (*)    All other components within normal limits  BRAIN NATRIURETIC PEPTIDE - Abnormal; Notable for the following components:   B Natriuretic Peptide 164.5 (*)    All other components within normal limits  TROPONIN I (HIGH SENSITIVITY) - Abnormal; Notable for the following components:   Troponin I (High Sensitivity) 53 (*)  All other components within normal limits   EKG ED ECG REPORT I, Merwyn Katos, the attending physician, personally viewed and interpreted this ECG. Date: 10/23/2023 EKG Time: 1955 Rate: 80 Rhythm: Atrial fibrillation QRS Axis: normal Intervals: normal ST/T Wave abnormalities: normal Narrative Interpretation: Rate controlled atrial fibrillation.  No evidence of acute ischemia RADIOLOGY ED MD interpretation: 2 view chest x-ray interpreted independently and shows cardiomegaly with no frank interstitial edema -Agree with radiology assessment Official radiology report(s): DG Chest 2 View Result Date: 10/23/2023 CLINICAL DATA:  Weakness EXAM: CHEST - 2 VIEW COMPARISON:  PET-CT dated 08/14/2023 FINDINGS: Cardiomegaly. No frank interstitial edema. No pleural effusion or pneumothorax. Left chest port terminates in the lower SVC. IMPRESSION: Cardiomegaly. No frank interstitial edema. Electronically Signed   By: Charline Bills M.D.    On: 10/23/2023 19:29   PROCEDURES: Critical Care performed: No Procedures MEDICATIONS ORDERED IN ED: Medications - No data to display IMPRESSION / MDM / ASSESSMENT AND PLAN / ED COURSE  I reviewed the triage vital signs and the nursing notes.                             The patient is on the cardiac monitor to evaluate for evidence of arrhythmia and/or significant heart rate changes. Patient's presentation is most consistent with acute presentation with potential threat to life or bodily function.  This patient presents to the ED for concern of insomnia and nighttime urination leading to generalized weakness, this involves an extensive number of treatment options, and is a complaint that carries with it a high risk of complications and morbidity.  The differential diagnosis includes sepsis, CHF exacerbation, ACS Co morbidities that complicate the patient evaluation  Prostate cancer, multiple myeloma, CKD, CHF Additional history obtained:  Additional history obtained from family members at bedside  External records from outside source obtained and reviewed including recent cardiology visit yesterday Lab Tests:  I Ordered, and personally interpreted labs.  The pertinent results include: Creatinine 1.74, BNP 164, troponin 5 3, hemoglobin 7.5, hematocrit 23.9 Imaging Studies ordered:  I ordered imaging studies including 2 view chest x-ray  I independently visualized and interpreted imaging which showed no evidence of acute abnormalities  I agree with the radiologist interpretation Cardiac Monitoring: / EKG:  The patient was maintained on a cardiac monitor.  I personally viewed and interpreted the cardiac monitored which showed an underlying rhythm of: Rate controlled atrial fibrillation Problem List / ED Course / Critical interventions / Medication management  Generalized weakness, insomnia, polyuria  Patient has a Remeron prescribed for insomnia however he has not been taking it.   Patient encouraged to take this medication on time and as prescribed  I have reviewed the patients home medicines and have made adjustments as needed  Test / Admission - Considered:  I considered admission for this patient however there are no signs of active infection, CHF, or ACS Dispo: Discharge home with oncology follow-up       FINAL CLINICAL IMPRESSION(S) / ED DIAGNOSES   Final diagnoses:  Generalized weakness  Primary insomnia   Rx / DC Orders   ED Discharge Orders     None      Note:  This document was prepared using Dragon voice recognition software and may include unintentional dictation errors.   Merwyn Katos, MD 10/23/23 [phone]

## 2023-10-23 NOTE — ED Triage Notes (Signed)
Pt presents to ED with /co of weakness, pt states he is currently getting chemo at this time for colon CA. Pt states weakness has been ongoing and states weakness has increased. Pt denies fevers sore chills. Pt is A&OX4. Pt c/o of rectum pain.

## 2023-10-28 ENCOUNTER — Other Ambulatory Visit: Payer: Medicare HMO

## 2023-10-28 ENCOUNTER — Ambulatory Visit: Payer: Medicare HMO | Admitting: Internal Medicine

## 2023-10-29 ENCOUNTER — Ambulatory Visit: Payer: Medicare HMO

## 2023-11-05 ENCOUNTER — Other Ambulatory Visit: Payer: Self-pay | Admitting: Oncology

## 2023-11-05 ENCOUNTER — Inpatient Hospital Stay: Payer: Medicare HMO

## 2023-11-05 ENCOUNTER — Ambulatory Visit: Payer: Medicare HMO | Admitting: Internal Medicine

## 2023-11-05 ENCOUNTER — Encounter: Payer: Self-pay | Admitting: Oncology

## 2023-11-05 ENCOUNTER — Telehealth: Payer: Self-pay | Admitting: *Deleted

## 2023-11-05 ENCOUNTER — Inpatient Hospital Stay: Payer: Medicare HMO | Admitting: Oncology

## 2023-11-05 DIAGNOSIS — C184 Malignant neoplasm of transverse colon: Secondary | ICD-10-CM

## 2023-11-05 NOTE — Telephone Encounter (Signed)
 Call placed to patient to follow up after missed appointment for chemotherapy today. Patient reports he didn't know he had treatment planned for today, patient reports he has been experiencing increased weakness and did not think he could make it today for appointment. RN offered appointment later today or this week for lab/MD to follow up for supportive care. Patient declined at this time but RN encouraged patient to call this week if he needs to be seen prior to rescheduled appointment for next week. Patient verbalized agreement. Treatment plan updated for patient to return to clinic on Wednesday 1/8.

## 2023-11-07 ENCOUNTER — Inpatient Hospital Stay: Payer: Medicare HMO | Attending: Oncology

## 2023-11-07 ENCOUNTER — Other Ambulatory Visit: Payer: Self-pay | Admitting: *Deleted

## 2023-11-07 ENCOUNTER — Telehealth: Payer: Self-pay | Admitting: *Deleted

## 2023-11-07 DIAGNOSIS — C61 Malignant neoplasm of prostate: Secondary | ICD-10-CM

## 2023-11-07 DIAGNOSIS — D649 Anemia, unspecified: Secondary | ICD-10-CM

## 2023-11-07 DIAGNOSIS — C184 Malignant neoplasm of transverse colon: Secondary | ICD-10-CM

## 2023-11-07 NOTE — Telephone Encounter (Signed)
 Patient scheduled for lab [facility] tomorrow

## 2023-11-07 NOTE — Telephone Encounter (Addendum)
 Patient called asking if he can be seen today or tomorrow citing he feels weak and does not feel good. Low energy, no pain but no strength. Next appointment is 11/13/23 Seen in ER 12/18 and hgb was low. He states he has no energy to even take his trash out or walk up stairs   Last Lab Component Ref Range & Units (hover) 2 wk ago (10/23/23) 3 wk ago (10/15/23) 1 mo ago (10/08/23) 1 mo ago (09/24/23) 1 mo ago (09/23/23) 1 mo ago (09/11/23) 2 mo ago (09/03/23)  WBC 1.7 Low  4.4 6.7 4.1 3.5 Low  5.4 10.0  RBC 2.51 Low  2.74 Low  2.73 Low  2.77 Low  2.67 Low  2.75 Low  2.62 Low   Hemoglobin 7.5 Low  8.0 Low  8.0 Low  8.2 Low  7.9 Low  8.2 Low  7.8 Low   HCT 23.9 Low  25.6 Low  26.1 Low  26.7 Low  25.5 Low  26.1 Low  25.7 Low   MCV 95.2 93.4 95.6 96.4 95.5 94.9 98.1  MCH 29.9 29.2 29.3 29.6 29.6 29.8 29.8  MCHC 31.4 31.3 30.7 30.7 31.0 31.4 30.4  RDW 16.4 High  16.9 High  17.3 High  18.0 High  18.3 High  18.4 High  18.7 High   Platelets 72 Low  97 Low  CM 95 Low  CM 119 Low  113 Low  94 Low  61 Low   Comment: Immature Platelet Fraction may be clinically indicated, consider ordering this additional test [id number] REPEATED TO VERIFY PLATELET COUNT CONFIRMED BY SMEAR  nRBC 0.0 0.0 0.0 0.0 0.0 0.0 0.0 CM  Neutrophils Relative % 71 73 78 67 68 78   Neutro Abs 1.2 Low  3.3 5.2 2.7 2.4 4.2   Lymphocytes Relative 17 8 8 10 10 10    Lymphs Abs 0.3 Low  0.4 Low  0.5 Low  0.4 Low  0.3 Low  0.5 Low    Monocytes Relative 8 16 9 19 20 9    Monocytes Absolute 0.1 0.7 0.6 0.8 0.7 0.5   Eosinophils Relative 2 2 1 3 2 2    Eosinophils Absolute 0.0 0.1 0.1 0.1 0.1 0.1   Basophils Relative 1 0 0 1 0 0   Basophils Absolute 0.0 0.0 0.0 0.0 0.0 0.0   Immature Granulocytes 1 1 4  0 0 1   Abs Immature Granulocytes 0.01 0.02 CM 0.29 High  CM 0.01 CM 0.00 CM 0.06 CM   Comment: Performed at [facility], [address] [place], [place] [address]  Resulting Agency [facility] CLIN LAB CH CLIN LAB CH CLIN LAB CH CLIN  LAB CH CLIN LAB CH CLIN LAB CH CLIN LAB        Specimen Collected: 10/23/23 17:32 Last Resulted: 10/23/23 18:21

## 2023-11-08 ENCOUNTER — Inpatient Hospital Stay: Payer: Medicare HMO | Attending: Oncology

## 2023-11-08 ENCOUNTER — Inpatient Hospital Stay: Payer: Medicare HMO

## 2023-11-08 ENCOUNTER — Encounter: Payer: Self-pay | Admitting: Hospice and Palliative Medicine

## 2023-11-08 ENCOUNTER — Other Ambulatory Visit: Payer: Self-pay

## 2023-11-08 ENCOUNTER — Inpatient Hospital Stay (HOSPITAL_BASED_OUTPATIENT_CLINIC_OR_DEPARTMENT_OTHER): Payer: Medicare HMO | Admitting: Hospice and Palliative Medicine

## 2023-11-08 VITALS — BP 104/55 | HR 63 | Temp 97.0°F | Resp 19 | Wt 230.9 lb

## 2023-11-08 DIAGNOSIS — C61 Malignant neoplasm of prostate: Secondary | ICD-10-CM | POA: Diagnosis not present

## 2023-11-08 DIAGNOSIS — R531 Weakness: Secondary | ICD-10-CM | POA: Insufficient documentation

## 2023-11-08 DIAGNOSIS — D649 Anemia, unspecified: Secondary | ICD-10-CM | POA: Diagnosis not present

## 2023-11-08 DIAGNOSIS — C184 Malignant neoplasm of transverse colon: Secondary | ICD-10-CM | POA: Diagnosis not present

## 2023-11-08 DIAGNOSIS — R29898 Other symptoms and signs involving the musculoskeletal system: Secondary | ICD-10-CM

## 2023-11-08 DIAGNOSIS — Z95828 Presence of other vascular implants and grafts: Secondary | ICD-10-CM

## 2023-11-08 DIAGNOSIS — D61818 Other pancytopenia: Secondary | ICD-10-CM | POA: Diagnosis present

## 2023-11-08 DIAGNOSIS — R5383 Other fatigue: Secondary | ICD-10-CM | POA: Insufficient documentation

## 2023-11-08 DIAGNOSIS — E860 Dehydration: Secondary | ICD-10-CM

## 2023-11-08 DIAGNOSIS — Z139 Encounter for screening, unspecified: Secondary | ICD-10-CM

## 2023-11-08 DIAGNOSIS — D61810 Antineoplastic chemotherapy induced pancytopenia: Secondary | ICD-10-CM | POA: Diagnosis not present

## 2023-11-08 DIAGNOSIS — C182 Malignant neoplasm of ascending colon: Secondary | ICD-10-CM | POA: Diagnosis not present

## 2023-11-08 LAB — CBC WITH DIFFERENTIAL (CANCER CENTER ONLY)
Abs Immature Granulocytes: 0 10*3/uL (ref 0.00–0.07)
Basophils Absolute: 0 10*3/uL (ref 0.0–0.1)
Basophils Relative: 1 %
Eosinophils Absolute: 0.1 10*3/uL (ref 0.0–0.5)
Eosinophils Relative: 4 %
HCT: 22 % — ABNORMAL LOW (ref 39.0–52.0)
Hemoglobin: 7 g/dL — ABNORMAL LOW (ref 13.0–17.0)
Immature Granulocytes: 0 %
Lymphocytes Relative: 13 %
Lymphs Abs: 0.3 10*3/uL — ABNORMAL LOW (ref 0.7–4.0)
MCH: 29.4 pg (ref 26.0–34.0)
MCHC: 31.8 g/dL (ref 30.0–36.0)
MCV: 92.4 fL (ref 80.0–100.0)
Monocytes Absolute: 0.6 10*3/uL (ref 0.1–1.0)
Monocytes Relative: 28 %
Neutro Abs: 1.1 10*3/uL — ABNORMAL LOW (ref 1.7–7.7)
Neutrophils Relative %: 54 %
Platelet Count: 123 10*3/uL — ABNORMAL LOW (ref 150–400)
RBC: 2.38 MIL/uL — ABNORMAL LOW (ref 4.22–5.81)
RDW: 17.7 % — ABNORMAL HIGH (ref 11.5–15.5)
WBC Count: 2 10*3/uL — ABNORMAL LOW (ref 4.0–10.5)
nRBC: 0 % (ref 0.0–0.2)

## 2023-11-08 LAB — SAMPLE TO BLOOD BANK

## 2023-11-08 LAB — CMP (CANCER CENTER ONLY)
ALT: 15 U/L (ref 0–44)
AST: 25 U/L (ref 15–41)
Albumin: 3.4 g/dL — ABNORMAL LOW (ref 3.5–5.0)
Alkaline Phosphatase: 199 U/L — ABNORMAL HIGH (ref 38–126)
Anion gap: 9 (ref 5–15)
BUN: 36 mg/dL — ABNORMAL HIGH (ref 8–23)
CO2: 23 mmol/L (ref 22–32)
Calcium: 8.4 mg/dL — ABNORMAL LOW (ref 8.9–10.3)
Chloride: 101 mmol/L (ref 98–111)
Creatinine: 2.42 mg/dL — ABNORMAL HIGH (ref 0.61–1.24)
GFR, Estimated: 29 mL/min — ABNORMAL LOW (ref >60)
Glucose, Bld: 124 mg/dL — ABNORMAL HIGH (ref 70–99)
Potassium: 4.2 mmol/L (ref 3.5–5.1)
Sodium: 133 mmol/L — ABNORMAL LOW (ref 135–145)
Total Bilirubin: 1.2 mg/dL (ref 0.0–1.2)
Total Protein: 7.1 g/dL (ref 6.5–8.1)

## 2023-11-08 LAB — MAGNESIUM: Magnesium: 1.5 mg/dL — ABNORMAL LOW (ref 1.7–2.4)

## 2023-11-08 LAB — PREPARE RBC (CROSSMATCH)

## 2023-11-08 LAB — TSH: TSH: 3.58 u[IU]/mL (ref 0.350–4.500)

## 2023-11-08 MED ORDER — SODIUM CHLORIDE 0.9 % IV SOLN
INTRAVENOUS | Status: AC
Start: 2023-11-08 — End: 2023-11-09
  Filled 2023-11-08 (×2): qty 250

## 2023-11-08 MED ORDER — MAGNESIUM CHLORIDE 64 MG PO TBEC: 1.0000 | DELAYED_RELEASE_TABLET | Freq: Every day | ORAL | 1 refills | Status: AC

## 2023-11-08 MED ORDER — SODIUM CHLORIDE 0.9% FLUSH
10.0000 mL | Freq: Once | INTRAVENOUS | Status: AC
Start: 2023-11-08 — End: 2023-11-08
  Administered 2023-11-08: 10 mL via INTRAVENOUS
  Filled 2023-11-08: qty 10

## 2023-11-08 MED ORDER — HEPARIN SOD (PORK) LOCK FLUSH 100 UNIT/ML IV SOLN
500.0000 [IU] | Freq: Once | INTRAVENOUS | Status: AC
  Administered 2023-11-08: 500 [IU]
  Filled 2023-11-08: qty 5

## 2023-11-08 MED ORDER — MAGNESIUM SULFATE 2 GM/50ML IV SOLN
2.0000 g | Freq: Once | INTRAVENOUS | Status: AC
  Administered 2023-11-08: 2 g via INTRAVENOUS
  Filled 2023-11-08: qty 50

## 2023-11-08 NOTE — Progress Notes (Signed)
 [facility] Clinical Social Work  Clinical Social Work was referred by medical provider, [employer], for assessment of psychosocial needs.  Clinical Social Worker contacted patient by phone to offer support and assess for needs.    Patient inquired about hospital grants, which he is currently not eligible for.  CSW provided  contact information for the [place] Department of Social Services because he said he would like to move to another apartment in the future.  His mother and brother assist with food, but he could benefit from additional help.  Informed him of the food pantry and [employer] card.  He said he would contact Sabrina at the front desk at his next appointment.  CSW provided active listening and supportive counseling.     Macario CHRISTELLA Au, LCSW  Clinical Social Worker [facility]        Patient is participating in a Managed Medicaid Plan:  Yes

## 2023-11-08 NOTE — Progress Notes (Signed)
 Patient states he is weak all the time. Patient states he is barely able to walk up steps. He states he is having shortness of breathe. Patient states his appetite is really poor. Patient states the neuropathy is really effecting him also but don't talk about it because he has other pain going on.

## 2023-11-08 NOTE — Progress Notes (Signed)
 Symptom Management Clinic [facility] at [facility] Telephone:([phone]) [phone] [phone]  Patient Care Team: Jeffie Cheryl BRAVO, MD as PCP - General (Family Medicine) [name], MD as Consulting Physician (Oncology) Lane Arthea BRAVO, MD as Referring Physician (Neurology) Lateef, Munsoor, MD as Consulting Physician (Nephrology)   NAME OF PATIENT: Chris George  [id number]  04-Aug-1956   DATE OF VISIT: 11/08/23  REASON FOR CONSULT: Chris George is a 68 y.o. male with multiple medical problems including recurrent stage IIIc colon cancer, smoldering myeloma, and prostate cancer.  History of stroke with residual expressive aphasia.  INTERVAL HISTORY: Patient was in clinic on 10/15/2023 by Dr. Jacobo and received cycle 9 FOLFIRI.  Patient continued to describe chronic weakness and fatigue.  Patient was in the ED on 10/23/2023 with complaints of weakness, insomnia, and frequent nighttime urination.  Workup was essentially unchanged from baseline.  Patient was recommended to take his prescribed mirtazapine  for insomnia and follow-up with outpatient oncology.  Patient presents to [facility] today again with complaint of weakness and fatigue.  Patient says that he gets tired with minimal exertion.  He tried to do some tasks around the house yesterday and feels like he might have overdone it.  Denies fever or chills.  Denies shortness of breath or chest pain.  Says he feels disheartened by cancer treatment in general as he has tolerated that better in the past.  Endorses poor oral intake.  Says he is sleeping better and urinary symptoms have improved.  Denies recent fevers or illnesses. Denies any easy bleeding or bruising. Reports weight loss. Denies chest pain. Denies any nausea, vomiting. Denies urinary complaints. Patient offers no further specific complaints today.   PAST MEDICAL HISTORY: Past Medical History:  Diagnosis Date   A-fib (HCC)     Anemia    Arthritis    ankles   Cancer (HCC)    multiple myeloma   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HCC) 10/26/2019   Cardiomyopathy (HCC)    Chemotherapy-induced peripheral neuropathy (HCC)    CHF (congestive heart failure) (HCC)    Depression    Dysrhythmia    atrial fib   History of CVA (cerebrovascular accident)    HLD (hyperlipidemia)    Hypercholesteremia    Hypertension    Moderate tricuspid insufficiency    Multiple myeloma (HCC)    not being treated right now per pt 11/11/19   Pneumonia    Sleep apnea    No CPAP.  OSA resolved with wt loss.    PAST SURGICAL HISTORY:  Past Surgical History:  Procedure Laterality Date   ATRIAL ABLATION SURGERY     CARDIAC SURGERY     A-Fib Ablations   COLONOSCOPY WITH PROPOFOL  N/A 10/26/2019   Procedure: COLONOSCOPY WITH BIOPSY;  Surgeon: Jinny Carmine, MD;  Location: [facility];  Service: Endoscopy;  Laterality: N/A;   COLONOSCOPY WITH PROPOFOL  N/A 09/03/2022   Procedure: COLONOSCOPY WITH PROPOFOL ;  Surgeon: Jinny Carmine, MD;  Location: [facility];  Service: Endoscopy;  Laterality: N/A;   ESOPHAGOGASTRODUODENOSCOPY (EGD) WITH PROPOFOL  N/A 10/26/2019   Procedure: ESOPHAGOGASTRODUODENOSCOPY (EGD) WITH BIOPSY;  Surgeon: Jinny Carmine, MD;  Location: [facility];  Service: Endoscopy;  Laterality: N/A;  sleep apnea   LAPAROSCOPIC PARTIAL COLECTOMY Right 11/17/2019   Procedure: LAPAROSCOPIC PARTIAL COLECTOMY RIGHT EXTENDED;  Surgeon: Desiderio Schanz, MD;  Location: [facility] ORS;  Service: General;  Laterality: Right;   PORTACATH PLACEMENT N/A 12/28/2019   Procedure: INSERTION PORT-A-CATH Left subclavian;  Surgeon: Desiderio,  Aloysius, MD;  Location: [facility] OR[facility];  Service: General;  Laterality: N/A;    HEMATOLOGY/ONCOLOGY HISTORY:  Oncology History  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HCC)  10/27/2019 Initial Diagnosis   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HCC)   12/01/2019 Cancer Staging   Staging form: Colon and Rectum, AJCC 8th Edition - Clinical  stage from 12/01/2019: Stage IIIC (cT3, cN2b, cM0) - Signed by [name], MD on 12/01/2019   12/30/2019 - 06/03/2020 Chemotherapy   Patient is on Treatment Plan : COLORECTAL FOLFOX q14d x 6 months     05/15/2023 -  Chemotherapy   Patient is on Treatment Plan : COLORECTAL FOLFIRI q14d       ALLERGIES:  has no known allergies.  MEDICATIONS:  Current Outpatient Medications  Medication Sig Dispense Refill   allopurinol  (ZYLOPRIM ) 100 MG tablet Take 200 mg by mouth daily.     apixaban  (ELIQUIS ) 5 MG TABS tablet Take 5 mg by mouth 2 (two) times daily.     APPLE CIDER VINEGAR PO Take 30-45 mLs by mouth every other day.      atorvastatin  (LIPITOR) 80 MG tablet Take 80 mg by mouth daily.     dicyclomine (BENTYL) 20 MG tablet Take by mouth.     furosemide  (LASIX ) 20 MG tablet Take by mouth.     gabapentin  (NEURONTIN ) 100 MG capsule Take 300 mg by mouth 2 (two) times daily.     HYDROcodone -acetaminophen  (NORCO) 5-325 MG tablet Take 1 tablet by mouth every 6 (six) hours as needed for moderate pain. 30 tablet 0   hydrocortisone  (ANUSOL -HC) 2.5 % rectal cream Place 1 Application rectally 2 (two) times daily. 30 g 0   hydrocortisone  cream 1 % Apply 1 Application topically 2 (two) times daily.     Lidocaine , Anorectal, 5 % GEL Apply 1 Application topically as needed. 30 g 1   lidocaine -prilocaine  (EMLA ) cream Apply 1 Application topically daily.     magnesium  chloride (SLOW-MAG) 64 MG TBEC SR tablet Take 1 tablet (64 mg total) by mouth daily. 60 tablet 0   MAGNESIUM  PO Take by mouth.     mirtazapine  (REMERON ) 7.5 MG tablet Take 1 tablet (7.5 mg total) by mouth at bedtime. 30 tablet 3   Misc Natural Products (TART CHERRY ADVANCED PO) Take 60 mLs by mouth every other day.     Multiple Vitamins-Minerals (ZINC PO) Take by mouth.     nifedipine  0.3 % ointment Place 1 Application rectally 4 (four) times daily. with Lidocaine  1.5% 30 g 0   potassium bicarbonate (K-LYTE) 25 MEQ disintegrating tablet  Take 25 mEq by mouth 2 (two) times daily. In the afternoon.     tamsulosin  (FLOMAX ) 0.4 MG CAPS capsule Take 1 capsule (0.4 mg total) by mouth daily after supper. 30 capsule 6   Zinc Sulfate (ZINC 15 PO) Take by mouth.     colchicine  0.6 MG tablet Take 1 tablet (0.6 mg total) by mouth 2 (two) times daily. Until flare resolves. (Patient not taking: Reported on 11/08/2023) 20 tablet 1   furosemide  (LASIX ) 80 MG tablet Take 40 mg by mouth daily as needed for edema. (Patient not taking: Reported on 09/03/2023)     loperamide (IMODIUM A-D) 2 MG tablet Take 2 mg by mouth 4 (four) times daily as needed for diarrhea or loose stools. (Patient not taking: Reported on 11/08/2023)     prochlorperazine  (COMPAZINE ) 10 MG tablet Take 1 tablet (10 mg total) by mouth every 6 (six) hours as needed for nausea or vomiting. (  Patient not taking: Reported on 11/08/2023) 60 tablet 1   tadalafil  (CIALIS ) 10 MG tablet Take 1-2 tablets (10-20 mg total) by mouth daily as needed for erectile dysfunction (take 45 minutes prior to secual activity). (Patient not taking: Reported on 11/08/2023) 30 tablet 6   No current facility-administered medications for this visit.   Facility-Administered Medications Ordered in Other Visits  Medication Dose Route Frequency Provider Last Rate Last Admin   heparin  lock flush 100 unit/mL  500 Units Intravenous Once Finnegan, Timothy J, MD        VITAL SIGNS: BP (!) 104/55   Pulse 63   Temp (!) 97 F (36.1 C)   Resp 19   Wt 230 lb 14.4 oz (104.7 kg)   SpO2 100%   BMI 34.10 kg/m  Filed Weights   11/08/23 0908  Weight: 230 lb 14.4 oz (104.7 kg)    Estimated body mass index is 34.1 kg/m as calculated from the following:   Height as of 10/23/23: 5' 9 (1.753 m).   Weight as of this encounter: 230 lb 14.4 oz (104.7 kg).  LABS: CBC:    Component Value Date/Time   WBC 2.0 (L) 11/08/2023 0859   WBC 1.7 (L) 10/23/2023 1732   HGB 7.0 (L) 11/08/2023 0859   HGB 14.3 05/13/2013 1116   HCT 22.0  (L) 11/08/2023 0859   HCT 42.9 05/13/2013 1116   PLT 123 (L) 11/08/2023 0859   PLT 169 05/13/2013 1116   MCV 92.4 11/08/2023 0859   MCV 84 05/13/2013 1116   NEUTROABS 1.1 (L) 11/08/2023 0859   NEUTROABS 2.3 05/13/2013 1116   LYMPHSABS 0.3 (L) 11/08/2023 0859   LYMPHSABS 0.9 (L) 05/13/2013 1116   MONOABS 0.6 11/08/2023 0859   MONOABS 0.4 05/13/2013 1116   EOSABS 0.1 11/08/2023 0859   EOSABS 0.1 05/13/2013 1116   BASOSABS 0.0 11/08/2023 0859   BASOSABS 0.0 05/13/2013 1116   Comprehensive Metabolic Panel:    Component Value Date/Time   NA 135 10/23/2023 1732   NA 143 05/13/2013 1116   K 4.0 10/23/2023 1732   K 3.5 05/13/2013 1116   CL 100 10/23/2023 1732   CL 102 05/13/2013 1116   CO2 27 10/23/2023 1732   CO2 32 05/13/2013 1116   BUN 30 (H) 10/23/2023 1732   BUN 14 05/13/2013 1116   CREATININE 1.74 (H) 10/23/2023 1732   CREATININE 1.72 (H) 10/15/2023 1043   CREATININE 1.53 (H) 05/13/2013 1116   GLUCOSE 115 (H) 10/23/2023 1732   GLUCOSE 122 (H) 05/13/2013 1116   CALCIUM  8.7 (L) 10/23/2023 1732   CALCIUM  8.6 05/13/2013 1116   AST 20 10/23/2023 1732   AST 21 10/15/2023 1043   ALT 17 10/23/2023 1732   ALT 12 10/15/2023 1043   ALT 37 07/24/2012 1915   ALKPHOS 192 (H) 10/23/2023 1732   ALKPHOS 129 07/24/2012 1915   BILITOT 0.9 10/23/2023 1732   BILITOT 1.0 10/15/2023 1043   PROT 6.8 10/23/2023 1732   PROT 8.4 (H) 07/24/2012 1915   ALBUMIN 3.2 (L) 10/23/2023 1732   ALBUMIN 4.0 07/24/2012 1915    RADIOGRAPHIC STUDIES: DG Chest 2 View Result Date: 10/23/2023 CLINICAL DATA:  Weakness EXAM: CHEST - 2 VIEW COMPARISON:  PET-CT dated 08/14/2023 FINDINGS: Cardiomegaly. No frank interstitial edema. No pleural effusion or pneumothorax. Left chest port terminates in the lower SVC. IMPRESSION: Cardiomegaly. No frank interstitial edema. Electronically Signed   By: Pinkie Pebbles M.D.   On: 10/23/2023 19:29    PERFORMANCE STATUS (ECOG) : 2 -  Symptomatic, <50% confined to  bed  Review of Systems Unless otherwise noted, a complete review of systems is negative.  Physical Exam General: NAD Cardiovascular: regular rate and rhythm Pulmonary: clear ant fields Abdomen: soft, nontender, + bowel sounds GU: no suprapubic tenderness Extremities: no edema, no joint deformities Skin: no rashes Neurological: Weakness, occasional expressive aphasia  IMPRESSION/PLAN: Colorectal cancer -on treatment FOLFIRI.  Recent PET scan showed improving retroperitoneal nodal metastasis.  Prostate cancer -followed by urology  Myeloma -on surveillance  Pancytopenia -secondary to chemotherapy.  Hemoglobin down to 7.0 and likely contributing factor in patient's overall fatigue and weakness.  Will proceed with transfusion on Monday (first chair availability).  Patient instructed to present to the emergency department in the interim if any worsening symptoms or fever or chills.  Dehydration -increase in serum creatinine.  Proceed with IV fluids.  Hypomagnesemia -patient not taking Slow-Mag.  Will refill and replete with IV magnesium  today.  Fatigue/weakness -likely multifactorial from cancer, treatment, and symptomatic anemia.  Again, will proceed with transfusion.  Will proceed with supportive care today.  Add on TSH level.  Will also refer to cancer rehab/care program  Depressive symptoms -patient on mirtazapine .  Followed by [facility].  Follow-up next week with Dr. Jacobo.    Patient expressed understanding and was in agreement with this plan. He also understands that He can call clinic at any time with any questions, concerns, or complaints.   Thank you for allowing me to participate in the care of this very pleasant patient.   Time Total: 45 minutes  Visit consisted of counseling and education dealing with the complex and emotionally intense issues of symptom management in the setting of serious illness.Greater than 50%  of this time was spent counseling and  coordinating care related to the above assessment and plan.  Signed by: Fonda Mower, PhD, NP-C

## 2023-11-08 NOTE — Progress Notes (Signed)
 Patient has not been able to afford his Eliquis . He was given Eliquis  copay cards. His out of pocket cost for the Eliquis  has increased in 2025. He stated that is not eligible for certain subsidies, but he would like to meet with [facility] social worker to discuss his concerns. He is also having challenges with monthly rent. He is interested in switching to a different anticoagulant if medication cost could be lower. I encouraged pt to reach out to cardiologist at [facility] or pcp to discuss changing his anticoagulant as cardiologist was prescribing provider. Social worker referral placed.

## 2023-11-11 ENCOUNTER — Other Ambulatory Visit: Payer: Self-pay | Admitting: Oncology

## 2023-11-11 ENCOUNTER — Inpatient Hospital Stay (HOSPITAL_BASED_OUTPATIENT_CLINIC_OR_DEPARTMENT_OTHER): Payer: Medicare HMO | Admitting: Oncology

## 2023-11-11 ENCOUNTER — Inpatient Hospital Stay: Payer: Medicare HMO

## 2023-11-11 DIAGNOSIS — D649 Anemia, unspecified: Secondary | ICD-10-CM

## 2023-11-11 DIAGNOSIS — D61810 Antineoplastic chemotherapy induced pancytopenia: Secondary | ICD-10-CM | POA: Diagnosis not present

## 2023-11-11 DIAGNOSIS — N5089 Other specified disorders of the male genital organs: Secondary | ICD-10-CM | POA: Diagnosis not present

## 2023-11-11 LAB — CMP (CANCER CENTER ONLY)
ALT: 15 U/L (ref 0–44)
AST: 33 U/L (ref 15–41)
Albumin: 3.2 g/dL — ABNORMAL LOW (ref 3.5–5.0)
Alkaline Phosphatase: 204 U/L — ABNORMAL HIGH (ref 38–126)
Anion gap: 7 (ref 5–15)
BUN: 39 mg/dL — ABNORMAL HIGH (ref 8–23)
CO2: 25 mmol/L (ref 22–32)
Calcium: 8.3 mg/dL — ABNORMAL LOW (ref 8.9–10.3)
Chloride: 103 mmol/L (ref 98–111)
Creatinine: 2.27 mg/dL — ABNORMAL HIGH (ref 0.61–1.24)
GFR, Estimated: 31 mL/min — ABNORMAL LOW (ref >60)
Glucose, Bld: 101 mg/dL — ABNORMAL HIGH (ref 70–99)
Potassium: 4 mmol/L (ref 3.5–5.1)
Sodium: 135 mmol/L (ref 135–145)
Total Bilirubin: 1.1 mg/dL (ref 0.0–1.2)
Total Protein: 6.9 g/dL (ref 6.5–8.1)

## 2023-11-11 LAB — CBC WITH DIFFERENTIAL (CANCER CENTER ONLY)
Abs Immature Granulocytes: 0 10*3/uL (ref 0.00–0.07)
Basophils Absolute: 0 10*3/uL (ref 0.0–0.1)
Basophils Relative: 0 %
Eosinophils Absolute: 0.1 10*3/uL (ref 0.0–0.5)
Eosinophils Relative: 5 %
HCT: 20.8 % — ABNORMAL LOW (ref 39.0–52.0)
Hemoglobin: 6.6 g/dL — CL (ref 13.0–17.0)
Immature Granulocytes: 0 %
Lymphocytes Relative: 12 %
Lymphs Abs: 0.3 10*3/uL — ABNORMAL LOW (ref 0.7–4.0)
MCH: 29.3 pg (ref 26.0–34.0)
MCHC: 31.7 g/dL (ref 30.0–36.0)
MCV: 92.4 fL (ref 80.0–100.0)
Monocytes Absolute: 0.7 10*3/uL (ref 0.1–1.0)
Monocytes Relative: 27 %
Neutro Abs: 1.5 10*3/uL — ABNORMAL LOW (ref 1.7–7.7)
Neutrophils Relative %: 56 %
Platelet Count: 117 10*3/uL — ABNORMAL LOW (ref 150–400)
RBC: 2.25 MIL/uL — ABNORMAL LOW (ref 4.22–5.81)
RDW: 17.7 % — ABNORMAL HIGH (ref 11.5–15.5)
WBC Count: 2.6 10*3/uL — ABNORMAL LOW (ref 4.0–10.5)
nRBC: 0 % (ref 0.0–0.2)

## 2023-11-11 LAB — MAGNESIUM: Magnesium: 1.8 mg/dL (ref 1.7–2.4)

## 2023-11-11 LAB — PREPARE RBC (CROSSMATCH)

## 2023-11-11 MED ORDER — ACETAMINOPHEN 325 MG PO TABS
650.0000 mg | ORAL_TABLET | Freq: Once | ORAL | Status: AC
  Administered 2023-11-11: 650 mg via ORAL
  Filled 2023-11-11: qty 2

## 2023-11-11 MED ORDER — DIPHENHYDRAMINE HCL 25 MG PO CAPS: 25.0000 mg | ORAL_CAPSULE | Freq: Once | ORAL | Status: DC

## 2023-11-11 MED ORDER — SODIUM CHLORIDE 0.9% IV SOLUTION
250.0000 mL | INTRAVENOUS | Status: DC
  Administered 2023-11-11: 250 mL via INTRAVENOUS
  Filled 2023-11-11: qty 250

## 2023-11-11 MED ORDER — HEPARIN SOD (PORK) LOCK FLUSH 100 UNIT/ML IV SOLN
250.0000 [IU] | INTRAVENOUS | Status: DC | PRN
  Filled 2023-11-11: qty 5

## 2023-11-11 NOTE — Patient Instructions (Signed)

## 2023-11-11 NOTE — Progress Notes (Signed)
 [facility]  [phone] [phone]  ID: Chris George OB: 27-Sep-1956  MR#: [id number]  RDW#:[id number]  Patient Care Team: Jeffie Cheryl FORBES, MD as PCP - General (Family Medicine) [name], MD as Consulting Physician (Oncology) [name], MD as Referring Physician (Neurology) Marcelino Gales, MD as Consulting Physician (Nephrology) Jama Margery ORN, MD as Referring Physician (Cardiology)  CHIEF COMPLAINT: Recurrent colon cancer, smoldering myeloma, prostate cancer.  INTERVAL HISTORY: Patient returns to clinic today as an add-on with profound weakness and and fatigue as well as dyspnea on exertion.  Hemoglobin was noted to be 6.6. He otherwise feels well.  He has a mild expressive aphasia and a chronic peripheral neuropathy, but no other neurologic complaints. He denies any recent fevers or illnesses.  He has a fair appetite and denies weight loss.  He has no chest pain, cough, or hemoptysis.  He denies any nausea, vomiting, constipation, or diarrhea.  He has no melena or hematochezia.  He has no urinary complaints.  Patient offers no further specific complaints today.  REVIEW OF SYSTEMS:   Review of Systems  Constitutional:  Positive for malaise/fatigue. Negative for fever and weight loss.  Respiratory: Negative.  Negative for cough, hemoptysis and shortness of breath.   Cardiovascular:  Positive for leg swelling. Negative for chest pain.  Gastrointestinal: Negative.  Negative for blood in stool, diarrhea and melena.  Genitourinary: Negative.  Negative for hematuria.  Musculoskeletal: Negative.  Negative for back pain and joint pain.  Skin: Negative.  Negative for rash.  Neurological:  Positive for tingling, sensory change and weakness. Negative for dizziness, focal weakness and headaches.  Psychiatric/Behavioral: Negative.  Negative for depression. The patient is not nervous/anxious.     As per HPI. Otherwise, a complete review of  systems is negative.  PAST MEDICAL HISTORY: Past Medical History:  Diagnosis Date   A-fib (HCC)    Anemia    Arthritis    ankles   Cancer (HCC)    multiple myeloma   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HCC) 10/26/2019   Cardiomyopathy (HCC)    Chemotherapy-induced peripheral neuropathy (HCC)    CHF (congestive heart failure) (HCC)    Depression    Dysrhythmia    atrial fib   History of CVA (cerebrovascular accident)    HLD (hyperlipidemia)    Hypercholesteremia    Hypertension    Moderate tricuspid insufficiency    Multiple myeloma (HCC)    not being treated right now per pt 11/11/19   Pneumonia    Sleep apnea    No CPAP.  OSA resolved with wt loss.    PAST SURGICAL HISTORY: Past Surgical History:  Procedure Laterality Date   ATRIAL ABLATION SURGERY     CARDIAC SURGERY     A-Fib Ablations   COLONOSCOPY WITH PROPOFOL  N/A 10/26/2019   Procedure: COLONOSCOPY WITH BIOPSY;  Surgeon: Jinny Carmine, MD;  Location: [facility];  Service: Endoscopy;  Laterality: N/A;   COLONOSCOPY WITH PROPOFOL  N/A 09/03/2022   Procedure: COLONOSCOPY WITH PROPOFOL ;  Surgeon: Jinny Carmine, MD;  Location: [facility];  Service: Endoscopy;  Laterality: N/A;   ESOPHAGOGASTRODUODENOSCOPY (EGD) WITH PROPOFOL  N/A 10/26/2019   Procedure: ESOPHAGOGASTRODUODENOSCOPY (EGD) WITH BIOPSY;  Surgeon: Jinny Carmine, MD;  Location: [facility];  Service: Endoscopy;  Laterality: N/A;  sleep apnea   LAPAROSCOPIC PARTIAL COLECTOMY Right 11/17/2019   Procedure: LAPAROSCOPIC PARTIAL COLECTOMY RIGHT EXTENDED;  Surgeon: Desiderio Schanz, MD;  Location: [facility];  Service: General;  Laterality: Right;  PORTACATH PLACEMENT N/A 12/28/2019   Procedure: INSERTION PORT-A-CATH Left subclavian;  Surgeon: Desiderio Schanz, MD;  Location: [facility];  Service: General;  Laterality: N/A;    FAMILY HISTORY: Family History  Problem Relation Age of Onset   Healthy Mother     ADVANCED DIRECTIVES (Y/N):  N  HEALTH  MAINTENANCE: Social History   Tobacco Use   Smoking status: Never    Passive exposure: Never   Smokeless tobacco: Never  Vaping Use   Vaping status: Never Used  Substance Use Topics   Alcohol use: Not Currently   Drug use: Never     Colonoscopy:  PAP:  Bone density:  Lipid panel:  No Known Allergies  Current Outpatient Medications  Medication Sig Dispense Refill   allopurinol  (ZYLOPRIM ) 100 MG tablet Take 200 mg by mouth daily.     apixaban  (ELIQUIS ) 5 MG TABS tablet Take 5 mg by mouth 2 (two) times daily.     APPLE CIDER VINEGAR PO Take 30-45 mLs by mouth every other day.      atorvastatin  (LIPITOR) 80 MG tablet Take 80 mg by mouth daily.     colchicine  0.6 MG tablet Take 1 tablet (0.6 mg total) by mouth 2 (two) times daily. Until flare resolves. (Patient not taking: Reported on 11/08/2023) 20 tablet 1   dicyclomine (BENTYL) 20 MG tablet Take by mouth.     furosemide  (LASIX ) 20 MG tablet Take by mouth.     furosemide  (LASIX ) 80 MG tablet Take 40 mg by mouth daily as needed for edema. (Patient not taking: Reported on 09/03/2023)     gabapentin  (NEURONTIN ) 100 MG capsule Take 300 mg by mouth 2 (two) times daily.     HYDROcodone -acetaminophen  (NORCO) 5-325 MG tablet Take 1 tablet by mouth every 6 (six) hours as needed for moderate pain. 30 tablet 0   hydrocortisone  (ANUSOL -HC) 2.5 % rectal cream Place 1 Application rectally 2 (two) times daily. 30 g 0   hydrocortisone  cream 1 % Apply 1 Application topically 2 (two) times daily.     Lidocaine , Anorectal, 5 % GEL Apply 1 Application topically as needed. 30 g 1   lidocaine -prilocaine  (EMLA ) cream Apply 1 Application topically daily.     loperamide (IMODIUM A-D) 2 MG tablet Take 2 mg by mouth 4 (four) times daily as needed for diarrhea or loose stools. (Patient not taking: Reported on 11/08/2023)     magnesium  chloride (SLOW-MAG) 64 MG TBEC SR tablet Take 1 tablet (64 mg total) by mouth daily. 60 tablet 1   MAGNESIUM  PO Take by mouth.      mirtazapine  (REMERON ) 7.5 MG tablet Take 1 tablet (7.5 mg total) by mouth at bedtime. 30 tablet 3   Misc Natural Products (TART CHERRY ADVANCED PO) Take 60 mLs by mouth every other day.     Multiple Vitamins-Minerals (ZINC PO) Take by mouth.     nifedipine  0.3 % ointment Place 1 Application rectally 4 (four) times daily. with Lidocaine  1.5% 30 g 0   potassium bicarbonate (K-LYTE) 25 MEQ disintegrating tablet Take 25 mEq by mouth 2 (two) times daily. In the afternoon.     prochlorperazine  (COMPAZINE ) 10 MG tablet Take 1 tablet (10 mg total) by mouth every 6 (six) hours as needed for nausea or vomiting. (Patient not taking: Reported on 11/08/2023) 60 tablet 1   tadalafil  (CIALIS ) 10 MG tablet Take 1-2 tablets (10-20 mg total) by mouth daily as needed for erectile dysfunction (take 45 minutes prior to secual activity). (Patient not taking:  Reported on 11/08/2023) 30 tablet 6   tamsulosin  (FLOMAX ) 0.4 MG CAPS capsule Take 1 capsule (0.4 mg total) by mouth daily after supper. 30 capsule 6   Zinc Sulfate (ZINC 15 PO) Take by mouth.     No current facility-administered medications for this visit.   Facility-Administered Medications Ordered in Other Visits  Medication Dose Route Frequency Provider Last Rate Last Admin   0.9 %  sodium chloride  infusion (Manually program via Guardrails IV Fluids)  250 mL Intravenous Continuous Borders, Fonda SAUNDERS, NP       acetaminophen  (TYLENOL ) tablet 650 mg  650 mg Oral Once Borders, Joshua R, NP       diphenhydrAMINE  (BENADRYL ) capsule 25 mg  25 mg Oral Once Borders, Joshua R, NP       heparin  lock flush 100 unit/mL  500 Units Intravenous Once Genevie Elman J, MD       heparin  lock flush 100 unit/mL  250 Units Intracatheter PRN Borders, Fonda SAUNDERS, NP        OBJECTIVE: There were no vitals filed for this visit.      There is no height or weight on file to calculate BMI.    ECOG FS:1 - Symptomatic but completely ambulatory  General: Well-developed, well-nourished,  no acute distress. Eyes: Pink conjunctiva, anicteric sclera. HEENT: Normocephalic, moist mucous membranes. Lungs: No audible wheezing or coughing. Heart: Regular rate and rhythm. Abdomen: Soft, nontender, no obvious distention. Musculoskeletal: No edema, cyanosis, or clubbing. Neuro: Alert, answering all questions appropriately. Cranial nerves grossly intact. Skin: No rashes or petechiae noted. Psych: Normal affect.  LAB RESULTS:  Lab Results  Component Value Date   NA 135 11/11/2023   K 4.0 11/11/2023   CL 103 11/11/2023   CO2 25 11/11/2023   GLUCOSE 101 (H) 11/11/2023   BUN 39 (H) 11/11/2023   CREATININE 2.27 (H) 11/11/2023   CALCIUM  8.3 (L) 11/11/2023   PROT 6.9 11/11/2023   ALBUMIN 3.2 (L) 11/11/2023   AST 33 11/11/2023   ALT 15 11/11/2023   ALKPHOS 204 (H) 11/11/2023   BILITOT 1.1 11/11/2023   GFRNONAA 31 (L) 11/11/2023   GFRAA >60 07/29/2020    Lab Results  Component Value Date   WBC 2.6 (L) 11/11/2023   NEUTROABS 1.5 (L) 11/11/2023   HGB 6.6 (LL) 11/11/2023   HCT 20.8 (L) 11/11/2023   MCV 92.4 11/11/2023   PLT 117 (L) 11/11/2023   Lab Results  Component Value Date   IRON 79 08/13/2023   TIBC 234 (L) 08/13/2023   IRONPCTSAT 34 08/13/2023   Lab Results  Component Value Date   FERRITIN 686 (H) 09/03/2023     STUDIES: DG Chest 2 View Result Date: 10/23/2023 CLINICAL DATA:  Weakness EXAM: CHEST - 2 VIEW COMPARISON:  PET-CT dated 08/14/2023 FINDINGS: Cardiomegaly. No frank interstitial edema. No pleural effusion or pneumothorax. Left chest port terminates in the lower SVC. IMPRESSION: Cardiomegaly. No frank interstitial edema. Electronically Signed   By: Pinkie Pebbles M.D.   On: 10/23/2023 19:29    ONCOLOGY HISTORY: Patient completed cycle 9 of adjuvant FOLFOX on June 03, 2020.  Given his difficulties with treatment and declining performance status, treatment was discontinued altogether. Colonoscopy on September 03, 2022 did not reveal any significant  pathology and recommendation was to repeat in 3 years.    ASSESSMENT: Stage IIIc colon cancer, smoldering myeloma, prostate cancer.  PLAN:    Stage IIIc colon cancer:  CT scan from December 07, 2022 reviewed independently with enlarging aortocaval node  now measuring 1.9 x 1.4 cm.  PET scan results from Mar 15, 2023 reviewed independently with progressive retroperitoneal lymphadenopathy presumably related to patient's colon cancer.  Patient CEA was initially trending down, but recently has trended up to 172.  Today's result is pending.  Repeat PET scan on August 15, 2023 reviewed independently with significant improvement of patient's disease burden.  Treatment was previously dose reduced 10% given his renal insufficiency and baseline pancytopenia.  Will hold Avastin at this time given his recent stroke.  Return to clinic on November 13, 2023 as previously scheduled for further evaluation and consideration of cycle 10.  Given his current level of pancytopenia, will consider delaying treatment 1 week.   Prostate cancer: Gleason's 3+4.  Patient's PSA increased and peaked at 39.34, but since completing treatment has decreased and is now 4.24.  Nuclear medicine bone scan on December 13, 2022 was reported as negative.  PSMA PET per radiation oncology from Mar 21, 2023 did not reveal metastatic disease.  Patient completed XRT for local control disease on May 30, 2023.  Repeat imaging as above.  Can consider Eligard in the future.  Continue follow-up with urology as indicated. Smoldering myeloma: Chronic and unchanged.  Patient was initially diagnosed and treated in [place], [place]  and treated with single agent Velcade in approximately 2013.  He declined maintenance treatment or referral for bone marrow transplant.  His M spike has ranged from 1.1-2.0 since July 2020.  His most recent result was 1.6.  Over the same timeframe his IgG component has ranged from [phone].  His most recent result is 2344.  Finally,  his kappa free light chains have ranged from 24.8 -75.3 with his most recent result reported at 36.2.  Nuclear med bone scan as above.  His most recent bone marrow biopsy completed on May 28, 2019 revealed only 10 to 15% plasma cells with no clonality reported.  Cytogenetics were also reported as normal.  Continue to monitor closely.  Anemia: Hemoglobin decreased to 6.6.  Proceed with 2 units packed red blood cells today.   Neutropenia: Resolved.  Patient's ANC is 1.5 today.  Continue Udenyca  with pump removal.  Dose reduced FOLFIRI as above. Thrombocytopenia: Platelets improved to 117.  Proceed with treatment cautiously as above.  Renal insufficiency: Patient's creatinine remains elevated from his baseline, but improved in previous at 2.27.  Monitor.  Neither irinotecan  or 5-FU need to be dose reduced in the setting of renal insufficiency.  Appreciate nephrology input. Hypomagnesia: Resolved.  Patient's magnesium  is 1.8 today.   Peripheral neuropathy: Chronic and unchanged.  Patient reports gabapentin  did not help.  He was recently initiated on Lyrica  50 mg daily and given a referral to neurology.   Hypotension: Resolved. Coping/anxiety: Patient was previously given a referral to [facility].  Patient reports he talks to them approximately once per week. Peripheral edema: Nephrology as above.  Patient was also given a referral back to cardiology.  Continue compression stockings as directed. Swollen testicles: Patient has requested a referral back to urology for evaluation.  Patient expressed understanding and was in agreement with this plan. He also understands that He can call clinic at any time with any questions, concerns, or complaints.    Evalene JINNY Reusing, MD   11/11/2023 11:22 AM

## 2023-11-11 NOTE — Progress Notes (Signed)
 MD to see patient in infusion, patient added on for critical lab 6.6 and worsening shortness of breath.

## 2023-11-11 NOTE — Progress Notes (Signed)
 Pt here for one unit blood- Dr Jacobo and Sidra Borders made aware of following:  states feels worse form fluids and mag on Friday- 96.5 112/72 76 24 100%-- does keep tri-podding to cathc his breath, had to stop walking and tri-pod while standing on the way in.  Orders received to checks labs before proceeding.

## 2023-11-12 ENCOUNTER — Telehealth: Payer: Self-pay

## 2023-11-12 ENCOUNTER — Ambulatory Visit (INDEPENDENT_AMBULATORY_CARE_PROVIDER_SITE_OTHER): Payer: Medicare HMO | Admitting: Urology

## 2023-11-12 ENCOUNTER — Other Ambulatory Visit
Admission: RE | Admit: 2023-11-12 | Discharge: 2023-11-12 | Disposition: A | Discharge status: Home / Self Care | Payer: Medicare HMO | Attending: Urology | Admitting: Urology

## 2023-11-12 ENCOUNTER — Other Ambulatory Visit: Payer: Medicare HMO

## 2023-11-12 ENCOUNTER — Ambulatory Visit: Payer: Medicare HMO

## 2023-11-12 ENCOUNTER — Ambulatory Visit: Payer: Medicare HMO | Admitting: Oncology

## 2023-11-12 ENCOUNTER — Other Ambulatory Visit: Payer: Self-pay

## 2023-11-12 ENCOUNTER — Telehealth: Payer: Self-pay | Admitting: *Deleted

## 2023-11-12 VITALS — BP 115/73 | HR 97 | Wt 230.0 lb

## 2023-11-12 DIAGNOSIS — N529 Male erectile dysfunction, unspecified: Secondary | ICD-10-CM

## 2023-11-12 DIAGNOSIS — C61 Malignant neoplasm of prostate: Secondary | ICD-10-CM

## 2023-11-12 DIAGNOSIS — R972 Elevated prostate specific antigen [PSA]: Secondary | ICD-10-CM

## 2023-11-12 DIAGNOSIS — N5089 Other specified disorders of the male genital organs: Secondary | ICD-10-CM | POA: Diagnosis present

## 2023-11-12 LAB — URINALYSIS, COMPLETE (UACMP) WITH MICROSCOPIC
Bilirubin Urine: NEGATIVE
Glucose, UA: NEGATIVE mg/dL
Hgb urine dipstick: NEGATIVE
Ketones, ur: NEGATIVE mg/dL
Leukocytes,Ua: NEGATIVE
Nitrite: NEGATIVE
Protein, ur: 100 mg/dL — AB
Specific Gravity, Urine: 1.015 (ref 1.005–1.030)
pH: 7 (ref 5.0–8.0)

## 2023-11-12 LAB — BPAM RBC
Blood Product Expiration Date: 202501152359
Blood Product Expiration Date: 202501172359
ISSUE DATE / TIME: 202501061120
ISSUE DATE / TIME: 202501060932
Unit Type and Rh: 9500
Unit Type and Rh: 202501172359
Unit Type and Rh: 5100

## 2023-11-12 LAB — BLADDER SCAN AMB NON-IMAGING

## 2023-11-12 LAB — TYPE AND SCREEN
ABO/RH(D): O POS
Antibody Screen: NEGATIVE
Unit division: 0
Unit division: 0

## 2023-11-12 NOTE — Telephone Encounter (Signed)
 Patient called reporting that Urology cannot see him until 2/26 and he states that he cannot wait that long. He is asking if he should just go to the hospital for his problem. Can we call to get him seen sooner?

## 2023-11-12 NOTE — Progress Notes (Signed)
   11/12/2023 2:31 PM   Chris George 1956-09-25 [id number]  Reason for visit: Acute testicular swelling, history of prostate cancer and ED  HPI: 68 year old male with a number of comorbidities including history of stroke, smoldering myeloma, stage III colon cancer treated with chemotherapy, surgery, and radiation, as well as prostate cancer treated with radiation in July 2024.  He had a PSMA PET scan that showed no definite evidence of metastatic prostate cancer, but a 2 cm aortocaval lymph node worrisome for metastatic colon cancer.  PSA decreased, most recently 4.65 in December 2024.  He continues to be followed by Dr. Camelia with radiation oncology as well as Dr. Georgina again with oncology, they are considering ADT in the future pending PSA trend.  Challenging with his other malignancies and medical issues.  He was added to our clinic schedule today for left scrotal swelling.  He reports this has been present for a few months and has become increasingly bothersome based on the bulky size of the scrotum when laying down.  He denies any significant urinary symptoms or dysuria, no gross hematuria.  Not really having significant pain in the left testicle, more discomfort from bulkiness.  I personally viewed and interpreted the PET scan from October 2024, no evidence of inguinal hernia or hydrocele at that time.  I recommended a scrotal ultrasound for further evaluation of his left-sided scrotal swelling.  We discussed possible causes including epididymitis, hydrocele, hernia, varicocele.  Urinalysis today is still pending and we will call with those results, but low suspicion for epididymitis based on clinical picture.  Scrotal ultrasound, call with results Continue to trend PSA, consider ADT with oncology if rising   Chris JAYSON Burnet, MD  [facility] [address], [address] [place], [place] [address] [phone]

## 2023-11-12 NOTE — Telephone Encounter (Signed)
 Patient called back stating that he call them and he is going to be seen today in [place] @ 230.  States that he is still feeling weak and doubts he will take treatment tomorrow

## 2023-11-12 NOTE — Telephone Encounter (Signed)
 Received the following message via secure chat from [facility] with the cancer center:   Good morning- This patient is trying to reach your office about an appointment. He has recently had a stroke and has a hard time communicating- he is having some issues and would like to see if he can get in earlier than Feb 26. He says he may need to go to ER if he can't.  Could you let me know who to ask to get in touch with him? or can you have them call him? [phone] Thank you for your help.   Called pt he states that he is having significant LT sided scrotum swelling approximately the size of his fist currently. He states swelling started several weeks ago. It is tender to the touch and painful in general, he is unable to manage the pain at home and questions going to the ED for relief. He denies drainage or redness. He is unsure if he has a fever. Pt scheduled to come into office to r/o scrotal abscess.

## 2023-11-13 ENCOUNTER — Inpatient Hospital Stay: Payer: Medicare HMO

## 2023-11-13 ENCOUNTER — Inpatient Hospital Stay: Payer: Medicare HMO | Admitting: Oncology

## 2023-11-13 VITALS — BP 100/61 | HR 58 | Temp 97.3°F | Resp 18 | Wt 232.0 lb

## 2023-11-13 VITALS — BP 112/70 | HR 77 | Temp 96.0°F | Resp 19

## 2023-11-13 DIAGNOSIS — C184 Malignant neoplasm of transverse colon: Secondary | ICD-10-CM

## 2023-11-13 DIAGNOSIS — D509 Iron deficiency anemia, unspecified: Secondary | ICD-10-CM

## 2023-11-13 DIAGNOSIS — D61810 Antineoplastic chemotherapy induced pancytopenia: Secondary | ICD-10-CM | POA: Diagnosis not present

## 2023-11-13 LAB — CMP (CANCER CENTER ONLY)
ALT: 20 U/L (ref 0–44)
AST: 42 U/L — ABNORMAL HIGH (ref 15–41)
Albumin: 3.3 g/dL — ABNORMAL LOW (ref 3.5–5.0)
Alkaline Phosphatase: 218 U/L — ABNORMAL HIGH (ref 38–126)
Anion gap: 8 (ref 5–15)
BUN: 38 mg/dL — ABNORMAL HIGH (ref 8–23)
CO2: 23 mmol/L (ref 22–32)
Calcium: 8.5 mg/dL — ABNORMAL LOW (ref 8.9–10.3)
Chloride: 104 mmol/L (ref 98–111)
Creatinine: 2.15 mg/dL — ABNORMAL HIGH (ref 0.61–1.24)
GFR, Estimated: 33 mL/min — ABNORMAL LOW (ref >60)
Glucose, Bld: 167 mg/dL — ABNORMAL HIGH (ref 70–99)
Potassium: 4.3 mmol/L (ref 3.5–5.1)
Sodium: 135 mmol/L (ref 135–145)
Total Bilirubin: 1.5 mg/dL — ABNORMAL HIGH (ref 0.0–1.2)
Total Protein: 7 g/dL (ref 6.5–8.1)

## 2023-11-13 LAB — URINE CULTURE: Culture: NO GROWTH

## 2023-11-13 LAB — CBC WITH DIFFERENTIAL (CANCER CENTER ONLY)
Abs Immature Granulocytes: 0.01 10*3/uL (ref 0.00–0.07)
Basophils Absolute: 0 10*3/uL (ref 0.0–0.1)
Basophils Relative: 0 %
Eosinophils Absolute: 0.1 10*3/uL (ref 0.0–0.5)
Eosinophils Relative: 3 %
HCT: 25.2 % — ABNORMAL LOW (ref 39.0–52.0)
Hemoglobin: 8 g/dL — ABNORMAL LOW (ref 13.0–17.0)
Immature Granulocytes: 0 %
Lymphocytes Relative: 11 %
Lymphs Abs: 0.4 10*3/uL — ABNORMAL LOW (ref 0.7–4.0)
MCH: 29.1 pg (ref 26.0–34.0)
MCHC: 31.7 g/dL (ref 30.0–36.0)
MCV: 91.6 fL (ref 80.0–100.0)
Monocytes Absolute: 0.7 10*3/uL (ref 0.1–1.0)
Monocytes Relative: 20 %
Neutro Abs: 2.2 10*3/uL (ref 1.7–7.7)
Neutrophils Relative %: 66 %
Platelet Count: 130 10*3/uL — ABNORMAL LOW (ref 150–400)
RBC: 2.75 MIL/uL — ABNORMAL LOW (ref 4.22–5.81)
RDW: 17.7 % — ABNORMAL HIGH (ref 11.5–15.5)
WBC Count: 3.3 10*3/uL — ABNORMAL LOW (ref 4.0–10.5)
nRBC: 0 % (ref 0.0–0.2)

## 2023-11-13 LAB — MAGNESIUM: Magnesium: 1.6 mg/dL — ABNORMAL LOW (ref 1.7–2.4)

## 2023-11-13 MED ORDER — SODIUM CHLORIDE 0.9 % IV SOLN
Freq: Once | INTRAVENOUS | Status: AC
  Filled 2023-11-13: qty 250

## 2023-11-13 MED ORDER — HEPARIN SOD (PORK) LOCK FLUSH 100 UNIT/ML IV SOLN
500.0000 [IU] | Freq: Once | INTRAVENOUS | Status: AC
  Administered 2023-11-13: 500 [IU] via INTRAVENOUS
  Filled 2023-11-13: qty 5

## 2023-11-13 MED ORDER — MAGNESIUM SULFATE 2 GM/50ML IV SOLN
2.0000 g | Freq: Once | INTRAVENOUS | Status: AC
  Administered 2023-11-13: 2 g via INTRAVENOUS
  Filled 2023-11-13: qty 50

## 2023-11-13 NOTE — Progress Notes (Signed)
 Patient is not feeling the best this morning, feels like this cancer is "catching up to him." He does states that today is the best he's felt within the last few weeks.

## 2023-11-13 NOTE — Progress Notes (Signed)
 [facility]  [phone] [phone]  ID: Chris George OB: 05/31/1956  MR#: [id number]  RDW#:[id number]  Patient Care Team: Jeffie Cheryl FORBES, MD as PCP - General (Family Medicine) [name], MD as Consulting Physician (Oncology) [name], MD as Referring Physician (Neurology) Marcelino Gales, MD as Consulting Physician (Nephrology) Jama Margery ORN, MD as Referring Physician (Cardiology)  CHIEF COMPLAINT: Recurrent colon cancer, smoldering myeloma, prostate cancer.  INTERVAL HISTORY: Patient returns to clinic today for further evaluation and consideration of cycle 10 of FOLFIRI.  He feels improved since receiving a blood transfusion 2 days ago, but still has significant weakness and fatigue. He has a mild expressive aphasia and a chronic peripheral neuropathy, but no other neurologic complaints. He denies any recent fevers or illnesses.  He has a fair appetite and denies weight loss.  He has no chest pain, cough, or hemoptysis.  He denies any nausea, vomiting, constipation, or diarrhea.  He has no melena or hematochezia.  He has no urinary complaints.  Patient offers no further specific complaints today.  REVIEW OF SYSTEMS:   Review of Systems  Constitutional:  Positive for malaise/fatigue. Negative for fever and weight loss.  Respiratory: Negative.  Negative for cough, hemoptysis and shortness of breath.   Cardiovascular:  Positive for leg swelling. Negative for chest pain.  Gastrointestinal: Negative.  Negative for blood in stool, diarrhea and melena.  Genitourinary: Negative.  Negative for hematuria.  Musculoskeletal: Negative.  Negative for back pain and joint pain.  Skin: Negative.  Negative for rash.  Neurological:  Positive for tingling, sensory change and weakness. Negative for dizziness, focal weakness and headaches.  Psychiatric/Behavioral: Negative.  Negative for depression. The patient is not nervous/anxious.     As per  HPI. Otherwise, a complete review of systems is negative.  PAST MEDICAL HISTORY: Past Medical History:  Diagnosis Date   A-fib (HCC)    Anemia    Arthritis    ankles   Cancer (HCC)    multiple myeloma   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HCC) 10/26/2019   Cardiomyopathy (HCC)    Chemotherapy-induced peripheral neuropathy (HCC)    CHF (congestive heart failure) (HCC)    Depression    Dysrhythmia    atrial fib   History of CVA (cerebrovascular accident)    HLD (hyperlipidemia)    Hypercholesteremia    Hypertension    Moderate tricuspid insufficiency    Multiple myeloma (HCC)    not being treated right now per pt 11/11/19   Pneumonia    Sleep apnea    No CPAP.  OSA resolved with wt loss.    PAST SURGICAL HISTORY: Past Surgical History:  Procedure Laterality Date   ATRIAL ABLATION SURGERY     CARDIAC SURGERY     A-Fib Ablations   COLONOSCOPY WITH PROPOFOL  N/A 10/26/2019   Procedure: COLONOSCOPY WITH BIOPSY;  Surgeon: Jinny Carmine, MD;  Location: [facility];  Service: Endoscopy;  Laterality: N/A;   COLONOSCOPY WITH PROPOFOL  N/A 09/03/2022   Procedure: COLONOSCOPY WITH PROPOFOL ;  Surgeon: Jinny Carmine, MD;  Location: [facility];  Service: Endoscopy;  Laterality: N/A;   ESOPHAGOGASTRODUODENOSCOPY (EGD) WITH PROPOFOL  N/A 10/26/2019   Procedure: ESOPHAGOGASTRODUODENOSCOPY (EGD) WITH BIOPSY;  Surgeon: Jinny Carmine, MD;  Location: [facility];  Service: Endoscopy;  Laterality: N/A;  sleep apnea   LAPAROSCOPIC PARTIAL COLECTOMY Right 11/17/2019   Procedure: LAPAROSCOPIC PARTIAL COLECTOMY RIGHT EXTENDED;  Surgeon: Desiderio Schanz, MD;  Location: [facility];  Service: General;  Laterality:  Right;   PORTACATH PLACEMENT N/A 12/28/2019   Procedure: INSERTION PORT-A-CATH Left subclavian;  Surgeon: Desiderio Schanz, MD;  Location: [facility];  Service: General;  Laterality: N/A;    FAMILY HISTORY: Family History  Problem Relation Age of Onset   Healthy Mother     ADVANCED  DIRECTIVES (Y/N):  N  HEALTH MAINTENANCE: Social History   Tobacco Use   Smoking status: Never    Passive exposure: Never   Smokeless tobacco: Never  Vaping Use   Vaping status: Never Used  Substance Use Topics   Alcohol use: Not Currently   Drug use: Never     Colonoscopy:  PAP:  Bone density:  Lipid panel:  No Known Allergies  Current Outpatient Medications  Medication Sig Dispense Refill   allopurinol  (ZYLOPRIM ) 100 MG tablet Take 200 mg by mouth daily.     apixaban  (ELIQUIS ) 5 MG TABS tablet Take 5 mg by mouth 2 (two) times daily.     APPLE CIDER VINEGAR PO Take 30-45 mLs by mouth every other day.      atorvastatin  (LIPITOR) 80 MG tablet Take 80 mg by mouth daily.     colchicine  0.6 MG tablet Take 1 tablet (0.6 mg total) by mouth 2 (two) times daily. Until flare resolves. (Patient not taking: Reported on 09/03/2023) 20 tablet 1   dicyclomine (BENTYL) 20 MG tablet Take by mouth.     furosemide  (LASIX ) 80 MG tablet Take 1 tablet by mouth daily.     gabapentin  (NEURONTIN ) 100 MG capsule Take 300 mg by mouth 2 (two) times daily.     HYDROcodone -acetaminophen  (NORCO) 5-325 MG tablet Take 1 tablet by mouth every 6 (six) hours as needed for moderate pain. 30 tablet 0   hydrocortisone  (ANUSOL -HC) 2.5 % rectal cream Place 1 Application rectally 2 (two) times daily. 30 g 0   hydrocortisone  cream 1 % Apply 1 Application topically 2 (two) times daily.     Lidocaine , Anorectal, 5 % GEL Apply 1 Application topically as needed. 30 g 1   lidocaine -prilocaine  (EMLA ) cream Apply 1 Application topically daily.     loperamide (IMODIUM A-D) 2 MG tablet Take 2 mg by mouth 4 (four) times daily as needed for diarrhea or loose stools. (Patient not taking: Reported on 09/03/2023)     magnesium  chloride (SLOW-MAG) 64 MG TBEC SR tablet Take 1 tablet (64 mg total) by mouth daily. 60 tablet 1   MAGNESIUM  PO Take by mouth.     mirtazapine  (REMERON ) 7.5 MG tablet Take 1 tablet (7.5 mg total) by mouth at  bedtime. 30 tablet 3   Misc Natural Products (TART CHERRY ADVANCED PO) Take 60 mLs by mouth every other day.     Multiple Vitamins-Minerals (ZINC PO) Take by mouth.     nifedipine  0.3 % ointment Place 1 Application rectally 4 (four) times daily. with Lidocaine  1.5% 30 g 0   potassium bicarbonate (K-LYTE) 25 MEQ disintegrating tablet Take 25 mEq by mouth 2 (two) times daily. In the afternoon.     prochlorperazine  (COMPAZINE ) 10 MG tablet Take 1 tablet (10 mg total) by mouth every 6 (six) hours as needed for nausea or vomiting. (Patient not taking: Reported on 09/03/2023) 60 tablet 1   tadalafil  (CIALIS ) 10 MG tablet Take 1-2 tablets (10-20 mg total) by mouth daily as needed for erectile dysfunction (take 45 minutes prior to secual activity). (Patient not taking: Reported on 09/03/2023) 30 tablet 6   tamsulosin  (FLOMAX ) 0.4 MG CAPS capsule Take 1 capsule (0.4 mg  total) by mouth daily after supper. 30 capsule 6   Zinc Sulfate (ZINC 15 PO) Take by mouth.     No current facility-administered medications for this visit.   Facility-Administered Medications Ordered in Other Visits  Medication Dose Route Frequency Provider Last Rate Last Admin   heparin  lock flush 100 unit/mL  500 Units Intravenous Once Amri Lien J, MD        OBJECTIVE: Vitals:   11/13/23 0908  BP: 100/61  Pulse: (!) 58  Resp: 18  Temp: (!) 97.3 F (36.3 C)  SpO2: 100%        Body mass index is 34.26 kg/m.    ECOG FS:1 - Symptomatic but completely ambulatory  General: Well-developed, well-nourished, no acute distress. Eyes: Pink conjunctiva, anicteric sclera. HEENT: Normocephalic, moist mucous membranes. Lungs: No audible wheezing or coughing. Heart: Regular rate and rhythm. Abdomen: Soft, nontender, no obvious distention. Musculoskeletal: No edema, cyanosis, or clubbing. Neuro: Alert, answering all questions appropriately. Cranial nerves grossly intact. Skin: No rashes or petechiae noted. Psych: Normal  affect.  LAB RESULTS:  Lab Results  Component Value Date   NA 135 11/13/2023   K 4.3 11/13/2023   CL 104 11/13/2023   CO2 23 11/13/2023   GLUCOSE 167 (H) 11/13/2023   BUN 38 (H) 11/13/2023   CREATININE 2.15 (H) 11/13/2023   CALCIUM  8.5 (L) 11/13/2023   PROT 7.0 11/13/2023   ALBUMIN 3.3 (L) 11/13/2023   AST 42 (H) 11/13/2023   ALT 20 11/13/2023   ALKPHOS 218 (H) 11/13/2023   BILITOT 1.5 (H) 11/13/2023   GFRNONAA 33 (L) 11/13/2023   GFRAA >60 07/29/2020    Lab Results  Component Value Date   WBC 3.3 (L) 11/13/2023   NEUTROABS 2.2 11/13/2023   HGB 8.0 (L) 11/13/2023   HCT 25.2 (L) 11/13/2023   MCV 91.6 11/13/2023   PLT 130 (L) 11/13/2023   Lab Results  Component Value Date   IRON 79 08/13/2023   TIBC 234 (L) 08/13/2023   IRONPCTSAT 34 08/13/2023   Lab Results  Component Value Date   FERRITIN 686 (H) 09/03/2023     STUDIES: DG Chest 2 View Result Date: 10/23/2023 CLINICAL DATA:  Weakness EXAM: CHEST - 2 VIEW COMPARISON:  PET-CT dated 08/14/2023 FINDINGS: Cardiomegaly. No frank interstitial edema. No pleural effusion or pneumothorax. Left chest port terminates in the lower SVC. IMPRESSION: Cardiomegaly. No frank interstitial edema. Electronically Signed   By: Pinkie Pebbles M.D.   On: 10/23/2023 19:29    ONCOLOGY HISTORY: Patient completed cycle 9 of adjuvant FOLFOX on June 03, 2020.  Given his difficulties with treatment and declining performance status, treatment was discontinued altogether. Colonoscopy on September 03, 2022 did not reveal any significant pathology and recommendation was to repeat in 3 years.    ASSESSMENT: Stage IIIc colon cancer, smoldering myeloma, prostate cancer.  PLAN:    Stage IIIc colon cancer:  CT scan from December 07, 2022 reviewed independently with enlarging aortocaval node now measuring 1.9 x 1.4 cm.  PET scan results from Mar 15, 2023 reviewed independently with progressive retroperitoneal lymphadenopathy presumably related to  patient's colon cancer.  Patient CEA was initially trending down, but recently has trended up to 172.  Today's result is pending.  Repeat PET scan on August 15, 2023 reviewed independently with significant improvement of patient's disease burden.  Treatment was previously dose reduced 10% given his renal insufficiency and baseline pancytopenia.  Will hold Avastin at this time given his recent stroke.  Delay cycle 10  at patient's request today for increasing weakness and fatigue.  Return to clinic in 1 week for further evaluation and reconsideration of treatment.     Prostate cancer: Gleason's 3+4.  Patient's PSA increased and peaked at 39.34, but since completing treatment has decreased and is now 4.24.  Nuclear medicine bone scan on December 13, 2022 was reported as negative.  PSMA PET per radiation oncology from Mar 21, 2023 did not reveal metastatic disease.  Patient completed XRT for local control disease on May 30, 2023.  Repeat imaging as above.  Can consider Eligard in the future.  Continue follow-up with urology as indicated. Smoldering myeloma: Chronic and unchanged.  Patient was initially diagnosed and treated in [place], [place]  and treated with single agent Velcade in approximately 2013.  He declined maintenance treatment or referral for bone marrow transplant.  His M spike has ranged from 1.1-2.0 since July 2020.  His most recent result was 1.6.  Over the same timeframe his IgG component has ranged from [phone].  His most recent result is 2344.  Finally, his kappa free light chains have ranged from 24.8 -75.3 with his most recent result reported at 36.2.  Nuclear med bone scan as above.  His most recent bone marrow biopsy completed on May 28, 2019 revealed only 10 to 15% plasma cells with no clonality reported.  Cytogenetics were also reported as normal.  Continue to monitor closely.  Anemia: Hemoglobin improved to 8.0 with 2 units of packed red blood cells earlier this week.   Monitor. Neutropenia: Resolved.  Continue Udenyca  with pump removal.  Dose reduced FOLFIRI as above. Thrombocytopenia: Platelets improved to 130.  Monitor.   Renal insufficiency: Patient's creatinine has improved to 2.15.  Monitor.  Neither irinotecan  or 5-FU need to be dose reduced in the setting of renal insufficiency.  Appreciate nephrology input. Hypomagnesia: Magnesium  1.6 today.  He will receive 2 g of IV magnesium  along with 1 L of IV fluids today.   Peripheral neuropathy: Chronic and unchanged.  Patient reports gabapentin  did not help.  Continue Lyrica  50 mg daily and was previously given a referral to neurology.   Hypotension: Resolved. Coping/anxiety: Patient was previously given a referral to [facility].  Patient reports he talks to them approximately once per week. Peripheral edema: Nephrology as above.  Patient was also given a referral back to cardiology.  Continue compression stockings as directed. Swollen testicles: Follow-up with urology as scheduled.  Patient expressed understanding and was in agreement with this plan. He also understands that He can call clinic at any time with any questions, concerns, or complaints.    Evalene JINNY Reusing, MD   11/13/2023 9:20 AM

## 2023-11-14 LAB — CEA: CEA: 327 ng/mL — ABNORMAL HIGH (ref 0.0–4.7)

## 2023-11-15 ENCOUNTER — Inpatient Hospital Stay: Payer: Medicare HMO

## 2023-11-15 ENCOUNTER — Ambulatory Visit
Admission: RE | Admit: 2023-11-15 | Discharge: 2023-11-15 | Disposition: A | Discharge status: Home / Self Care | Payer: Medicare HMO | Source: Ambulatory Visit | Attending: Urology | Admitting: Urology

## 2023-11-15 ENCOUNTER — Telehealth: Payer: Self-pay | Admitting: *Deleted

## 2023-11-15 DIAGNOSIS — N5089 Other specified disorders of the male genital organs: Secondary | ICD-10-CM | POA: Diagnosis present

## 2023-11-15 NOTE — Telephone Encounter (Signed)
 RN called and spoke with patient and stated that due to him having shortness of breath, and has been declining as well as labs earlier this week for hgb and electrolytes were abnormal, MD advises he go to the emergency room to be seen.  Pt verbalized understanding.

## 2023-11-15 NOTE — Telephone Encounter (Signed)
 Patient called reporting that he had treatment this weeks and is not feeling well and is short of breath and that he is on his way to the office to possibly be seen , if not he will go to ER. Please advise if he can be seen this morning. He wants a return call

## 2023-11-18 ENCOUNTER — Telehealth: Payer: Self-pay | Admitting: Urology

## 2023-11-18 NOTE — Telephone Encounter (Signed)
 He would like to know what his results are from his ultrasound that he had on the 10th. He was upset and sounded nervous on the phone.  He only wants to talk to Dr. Richardo Hanks directly. Not his CMA.

## 2023-11-19 ENCOUNTER — Telehealth: Payer: Self-pay | Admitting: *Deleted

## 2023-11-19 ENCOUNTER — Ambulatory Visit: Payer: Medicare HMO

## 2023-11-19 ENCOUNTER — Other Ambulatory Visit: Payer: Medicare HMO

## 2023-11-19 ENCOUNTER — Ambulatory Visit: Payer: Medicare HMO | Admitting: Oncology

## 2023-11-19 DIAGNOSIS — I509 Heart failure, unspecified: Secondary | ICD-10-CM | POA: Insufficient documentation

## 2023-11-19 DIAGNOSIS — R7989 Other specified abnormal findings of blood chemistry: Secondary | ICD-10-CM | POA: Insufficient documentation

## 2023-11-19 DIAGNOSIS — N179 Acute kidney failure, unspecified: Secondary | ICD-10-CM | POA: Insufficient documentation

## 2023-11-19 DIAGNOSIS — I5033 Acute on chronic diastolic (congestive) heart failure: Secondary | ICD-10-CM | POA: Insufficient documentation

## 2023-11-19 NOTE — Telephone Encounter (Signed)
 Patient called reporting that he went to see his cardiologist this morning and he is going to be admitted to [facility] today for 2 - 3 days to get the fluid off his lungs so he won't be able to come in tomorrow for his treatment. He said he will have to call back to reschedule appointment.

## 2023-11-19 NOTE — Telephone Encounter (Signed)
 Mychart message sent to patient form Dr. Richardo Hanks.

## 2023-11-20 ENCOUNTER — Inpatient Hospital Stay: Payer: Medicare HMO

## 2023-11-20 ENCOUNTER — Inpatient Hospital Stay: Payer: Medicare HMO | Admitting: Oncology

## 2023-11-22 ENCOUNTER — Inpatient Hospital Stay: Payer: Medicare HMO

## 2023-11-27 ENCOUNTER — Encounter: Payer: Self-pay | Admitting: Oncology

## 2023-11-27 ENCOUNTER — Other Ambulatory Visit: Payer: Self-pay | Admitting: Oncology

## 2023-11-27 ENCOUNTER — Ambulatory Visit: Payer: Medicare HMO

## 2023-11-27 ENCOUNTER — Other Ambulatory Visit: Payer: Medicare HMO

## 2023-11-27 ENCOUNTER — Ambulatory Visit: Payer: Medicare HMO | Admitting: Oncology

## 2023-12-03 ENCOUNTER — Other Ambulatory Visit: Payer: Medicare HMO

## 2023-12-03 ENCOUNTER — Other Ambulatory Visit: Payer: Self-pay | Admitting: Oncology

## 2023-12-03 ENCOUNTER — Ambulatory Visit: Payer: Medicare HMO

## 2023-12-03 ENCOUNTER — Ambulatory Visit: Payer: Medicare HMO | Admitting: Oncology

## 2023-12-04 ENCOUNTER — Inpatient Hospital Stay: Payer: Medicare HMO

## 2023-12-04 ENCOUNTER — Inpatient Hospital Stay: Payer: Medicare HMO | Admitting: Occupational Therapy

## 2023-12-04 ENCOUNTER — Inpatient Hospital Stay: Payer: Medicare HMO | Admitting: Oncology

## 2023-12-11 ENCOUNTER — Ambulatory Visit: Payer: Medicare HMO | Admitting: Oncology

## 2023-12-11 ENCOUNTER — Encounter: Payer: Self-pay | Admitting: Oncology

## 2023-12-11 ENCOUNTER — Ambulatory Visit: Payer: Medicare HMO

## 2023-12-11 ENCOUNTER — Other Ambulatory Visit: Payer: Medicare HMO

## 2023-12-13 NOTE — Discharge Summary (Signed)
 ------------------------------------------------------------------------------- Attestation signed by Gehi, Anil Kishin, MD at 12/14/23 [phone] I saw and evaluated the patient, participating in the key portions of the service on the day of discharge.  I reviewed the resident's note and agree with the discharge plans and disposition.  I personally spent  less than 30 minutes in discharge planning services.   Anil K Gehi, MD  -------------------------------------------------------------------------------    Cardiology Discharge Summary  Identifying Information: Chris George Nov 15, 1955 [id number]   Admit Date: 11/19/2023 Discharge Date: 12/13/2023  Discharge To: Home with Home Health and/or PT/OT Discharge Service: Cardiology [facility]) Discharge Attending Physician: Anil Kishin Gehi, MD Discharge Cardiology Fellow: Esta Mott. MD Primary Care Physician: Clinic, [facility]   Discharge Diagnoses: Principal Problem:   Acute on chronic heart failure with preserved ejection fraction (CMS-HCC) Active Problems:   Malignant neoplasm of prostate (CMS-HCC)   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CMS-HCC)   Elevated troponin   Pancytopenia (CMS-HCC)   Acute kidney injury superimposed on chronic kidney disease (CMS-HCC)   Principal Diagnosis: Principal Diagnosis: HFpEF, Acute on Chronic  Secondary Diagnoses: Prostate and colon Cancer Prior stroke  Non-severe (Moderate) Protein-Calorie Malnutrition in the context of chronic illness (12/10/23 1344) Energy Intake: < 75% of estimated energy requirement for > or equal to 1 month Fat Loss: Moderate Muscle Loss: Moderate Malnutrition Score: 3  Outpatient Provider Follow Up Issues: [ ]  Ambulatory PM&R Stroke follow up with Verdon Ellen, NP or Norleen Ozell Punch, MD at [facility] ([address] [place], [place], phone: [phone]  [ ]  Follow up with valve clinic at discharge [ ]  Follow up BMP at cards appt  in 1 week  Hospital Course: Chris George is a 68 y.o. 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 Picture consistent with severe HF, with clear lungs, fluid collecting predominantly in lower extremities. Echo with severe TR, exam cw severe right-sided CHF.  Patient refractory to increasing doses of diuretics, so was briefly transferred to CICU for inotrope-assisted diuresis. Required Bumex  gtt and swan placement. UOP improved with bumex  gtt, was transferred back to floor. Was with appropriate UOP on Bumex  gtt, so was de-escalated to PO regimen, which patient tolerated without issue. Discharged on Bumex  2 mg BID, spironolactone 25mg  daily.   # Abdominal pain, distention # Ileus, resolved In setting of fluid overload and immobility and significant pain requiring as needed p.o. and IV opiates, developed ileus.  CT abdomen/pelvis with no transition point or concern for complete obstruction.  Abdominal exams revealed distention and diffuse tenderness. Proceeded with de-escalation of opiates, escalation of bowel regimen, and encouraged mobilizing patient to chair. Patient with significant improvement with these interventions, deferred placement of NGT.     # Oliguric AKI on CKD (improving) # Hyponatremia, resolved Seen by nephrology for AKI. Baseline Cr 1.3-1.5. No evidence of obstruction/retention. Likely cardiorenal. Cr downtrending with improved response to  diuresis. On 1/27 with new hyponatremia to 123, most likely in setting of aggressive diuresis, since resolved with decreasing  diuresis. Nephrology was consulted, followed for diuresis assistance.   # Scrotal hematoma, edema Patient presented with significant scrotal edema in the setting of volume overload with development of acute scrotal pain. US  with L hemiscrotum hematoma w mass effect on L testicle, arterial and venous waveforms present. Repeat scrotal US  1/22 with decreasing size of hematoma. Urology consulted, opting for conservative management with scrotal sling, pain control, serial exams. Per urology recommendations, held Eliquis  while inpatient due to hematoma. This was resumed at discharge.  # Atrial fibrillation # Junctional bradycardia Hx of Atrial fibrillation, but also has junctional bradycardia. Given bradycardia, patient unable to be on BB. He was previously on Eliquis  at home that was held in the setting of scrotal hematoma, resumed at discharge.   # Pain with Urination Likely 2/2 known prostate cancer, scrotal hematoma. Bladder scan without significant retention. UA with negative LE and nitrites. Received home Flomax  towards the latter part of admission, resumed upon discharge.   # Gout Reduced home dose of Allopurinol  in the setting of AKI. Home dose is 100mg  daily, was continued while inpatient. Discharged back on home dose Allopurinol  and Colchicine .   Procedures: No admission procedures for hospital encounter.  Discharge Day Services: BP 88/59   Pulse 53   Temp 36.3 C (97.3 F) (Oral)   Resp 18   Ht 175.3 cm (5' 9)   Wt 81.4 kg (179 lb 8 oz)   SpO2 100%   BMI 26.51 kg/m  Pt seen on the day of discharge and determined appropriate for discharge.  Condition at Discharge: good  Discharge Medications:   Your Medication List     STOP taking these medications    furosemide  80 MG tablet Commonly known as: LASIX    KLOR-CON /EF 25 MEQ disintegrating  tablet Generic drug: potassium bicarbonate       START taking these medications    bumetanide  2 MG tablet Commonly known as: BUMEX  Take 1 tablet (2 mg total) by mouth two (2) times a day.   spironolactone 25 MG tablet Commonly known as: ALDACTONE Take 1 tablet (25 mg total) by mouth daily.       CHANGE how you take these medications    allopurinol  100 MG tablet Commonly known as: ZYLOPRIM  Take 1 tablet (100 mg total) by mouth daily. What changed: when to take this       CONTINUE taking these medications    apixaban  5 mg Tab Commonly known as: ELIQUIS  Take 1 tablet (5 mg total) by mouth two (2) times a day.   atorvastatin  80 MG tablet Commonly known as: LIPITOR Take 1 tablet by mouth once daily   CALCIUM -MAGNESIUM -ZINC ORAL Take 3 tablets by mouth. Every other day-unknown strength   colchicine  0.6 mg tablet Commonly known as: COLCRYS  Take 1 tablet (0.6 mg total) by mouth daily as needed (gout flare).   FLOMAX  0.4 mg capsule Generic drug: tamsulosin  Take 1 capsule (0.4 mg total) by mouth daily.   lidocaine -prilocaine  2.5-2.5 % cream Commonly known as: EMLA  Apply topically as needed.   NON FORMULARY Take 10 mL by mouth. Apple Cider Vinegar-Along with concentrated cherry juice, Intermittently to prevent gout flares   NON FORMULARY Take 15 mL by mouth. Concentrated Cherry Juice-Along with apple cider vinegar, Intermittently to prevent gout flares.        Recent Labs: Microbiology Results (last day)     ** No results found for the last 24 hours. **      Lab Results  Component Value Date  WBC 5.7 12/13/2023   HGB 8.9 (L) 12/13/2023   HCT 25.0 (L) 12/13/2023   PLT 227 12/13/2023   Lab Results  Component Value Date   NA 133 (L) 12/13/2023   K 3.9 12/13/2023   CL 93 (L) 12/13/2023   CO2 26.0 12/13/2023   BUN 76 (H) 12/13/2023   CREATININE 2.83 (H) 12/13/2023   CALCIUM  9.6 12/13/2023   MG 1.8 12/13/2023   PHOS 4.8 12/13/2023   Lab  Results  Component Value Date   ALKPHOS 177 (H) 12/04/2023   BILITOT 2.6 (H) 12/04/2023   BILIDIR 1.70 (H) 12/04/2023   PROT 7.7 12/04/2023   ALBUMIN 2.9 (L) 12/04/2023   ALT 8 (L) 12/04/2023   AST 20 12/04/2023   Lab Results  Component Value Date   PT 20.5 (H) 11/19/2023   INR 1.80 11/19/2023   APTT 27.1 04/05/2023   Radiology: Echocardiogram Follow Up/Limited Echo Result Date: 12/11/2023 Patient Info Name:     ARTEZ REGIS Age:     68 years DOB:     Sep 20, 1956 Gender:     Male MRN:     [id number] Accession #:     [id number] UN Account #:     [id number] Ht:     175 cm Wt:     81 kg BSA:     1.99 m2 BP:     91 /     53 mmHg Exam Date:     12/11/2023 9:33 AM Admit Date:     11/19/2023 Exam Type:     ECHOCARDIOGRAM FOLLOW UP/LIMITED ECHO Technical Quality:     Good Staff Sonographer:     Nidia Pages Referring Physician:     Prentice SHAUNNA Search Reading Fellow:     Jacques HERO Ng Study Info Indications      - bubble only Procedure(s)   Limited 2D and agitated saline transthoracic echocardiogram is performed. Ultrasound Enhancing Agent/Agitated Saline ------------------------------ UEA/Ag. Saline:     Agitated Saline Amount:     --- ml Summary   1. Limited study to assess for intracardiac shunt.   2. There is no evidence of an interatrial flow communication or intrapulmonary shunt by agitated saline study.   3. The left ventricular systolic function is borderline, LVEF is visually estimated at 50-55%. Left Ventricle   The left ventricle is moderately dilated in size with normal wall thickness. The left ventricular systolic function is borderline, LVEF is visually estimated at 50-55%. Other Findings   There is no evidence of an interatrial flow communication or intrapulmonary shunt by agitated saline study. Report Signatures Finalized by Marinell Fairy How  MD on 12/11/2023 12:16 PM Resident Jacques HERO Moulding on 12/11/2023 09:57 AM  Echocardiogram Follow Up/Limited Echo Result Date: 12/10/2023 Patient  Info Name:     Chris George Age:     34 years DOB:     1956/04/23 Gender:     Male MRN:     [id number] Accession #:     [id number] UN Account #:     [id number] Ht:     175 cm Wt:     81 kg BSA:     1.99 m2 BP:     96 /     71 mmHg Exam Date:     12/10/2023 8:11 AM Admit Date:     11/19/2023 Exam Type:     ECHOCARDIOGRAM FOLLOW UP/LIMITED ECHO Technical Quality:     Fair Staff Sonographer:     Inge Meline Referring Physician:  Prentice SHAUNNA Search Reading Fellow:     Bebe JINNY Christella Douglas III Study Info Indications      - Re-eval TR/TV coaptation/RV now diuresed 30+ lbs Procedure(s)   Limited 2D, color flow and Doppler transthoracic echocardiogram is performed. Summary   1. The left ventricle is moderately dilated in size with normal wall thickness.   2. The left ventricular systolic function is borderline, LVEF is visually estimated at 50-55%.   3. The right ventricle is severely dilated in size, with normal systolic function.   4. The tricuspid valve leaflets are normal, with restricted leaflet mobility (particularly of the septal leaflet). There is no measurable coaptation gap.   5. There is moderate to severe tricuspid regurgitation.   6. There is moderate pulmonary hypertension.   7. TR maximum velocity: 3.3 m/s  Estimated PASP: 58 mmHg.   8. IVC size and inspiratory change suggest elevated right atrial pressure. (10-20 mmHg). Left Ventricle   The left ventricle is moderately dilated in size with normal wall thickness. The left ventricular systolic function is borderline, LVEF is visually estimated at 50-55%. Right Ventricle   The right ventricle is severely dilated in size, with normal systolic function. Left Atrium   The left atrium is relatively small in size. Right Atrium   The right atrium is severely dilated in size. Aortic Valve   The aortic valve is trileaflet with normal appearing leaflets with normal excursion. Mitral Valve   The mitral valve leaflets are normal with normal leaflet mobility. Tricuspid  Valve   The tricuspid valve leaflets are normal, with restricted leaflet mobility (particularly of the septal leaflet). There is no measurable coaptation gap. There is moderate to severe tricuspid regurgitation. There is moderate pulmonary hypertension. TR maximum velocity: 3.3 m/s  Estimated PASP: 58 mmHg. Inferior Vena Cava   IVC size and inspiratory change suggest elevated right atrial pressure. (10-20 mmHg). Pericardium/Pleural   There is no pericardial effusion. Ventricles ---------------------------------------------------------------------- Name                                 Value        Normal ---------------------------------------------------------------------- LV Dimensions 2D/MM ----------------------------------------------------------------------  IVS Diastolic Thickness (2D)                                0.9 cm       0.6-1.0 LVID Diastole (2D)                  6.2 cm       4.2-5.8  LVPW Diastolic Thickness (2D)                                1.0 cm       0.6-1.0 LV Mass Index (2D Cubed)          124 g/m2        49-115  Relative Wall Thickness (2D)                                  0.33        <=0.42 RV Dimensions 2D/MM ----------------------------------------------------------------------  RV Basal Diastolic Dimension  7.0 cm       2.5-4.1 TAPSE                               2.6 cm         >=1.7 Atria ---------------------------------------------------------------------- Name                                 Value        Normal ---------------------------------------------------------------------- RA Dimensions ---------------------------------------------------------------------- RA Area (4C)                      68.8 cm2        <=18.0 RA Area (4C) Index              34.5 cm2/m2               RA ESV Index (4C MOD)            210 ml/m2         18-32 Tricuspid Valve ---------------------------------------------------------------------- Name                                  Value        Normal ---------------------------------------------------------------------- TV Regurgitation Doppler ---------------------------------------------------------------------- TR Peak Velocity                   3.3 m/s               Estimated PAP/RSVP ---------------------------------------------------------------------- RA Pressure                        15 mmHg           <=5 RV Systolic Pressure               58 mmHg           <36 Report Signatures Finalized by Toni Cool on 12/10/2023 10:43 AM Resident Bebe JINNY Gentles Iii  III on 12/10/2023 10:07 AM  ECG 12 Lead Result Date: 12/05/2023 WIDE QRS RHYTHM WITH OCCASIONAL PREMATURE VENTRICULAR BEATS RIGHT BUNDLE BRANCH BLOCK LEFT ANTERIOR FASCICULAR BLOCK BIFASCICULAR BLOCK INFERIOR INFARCT , AGE UNDETERMINED T WAVE ABNORMALITY, CONSIDER LATERAL ISCHEMIA ABNORMAL ECG WHEN COMPARED WITH ECG OF 26-Nov-2023 03:17, PREMATURE VENTRICULAR BEATS ARE NOW PRESENT Confirmed by Antonetta Gull ([id number]) on 12/05/2023 11:36:24 AM  ECG 12 Lead Result Date: 12/04/2023 ATRIAL FIBRILLATION RIGHT BUNDLE BRANCH BLOCK POSSIBLE INFERIOR INFARCT NONSPECIFIC ST AND T WAVE ABNORMALITY ABNORMAL ECG WHEN COMPARED WITH ECG OF 26-Nov-2023 03:17, NO SIGNIFICANT CHANGE WAS FOUND Confirmed by Lennie Cough [phone]) on 12/04/2023 9:20:41 PM  CT Abdomen Pelvis Wo Contrast Result Date: 12/04/2023 EXAM: CT ABDOMEN PELVIS WO CONTRAST ACCESSION: [id number] UN REPORT DATE: 12/04/2023 2:29 AM CLINICAL INDICATION: 68 years old with c/f worsening ileus vs bowel obstruction  COMPARISON: None. TECHNIQUE: A spiral CT scan was obtained without IV contrast from the lung bases to the pubic symphysis.  Images were reconstructed in the axial plane. Coronal and sagittal reformatted images were also provided for further evaluation. Evaluation of the solid organs and vasculature is limited in the absence of intravenous contrast. FINDINGS: LOWER CHEST: Bibasilar atelectasis. Global. LIVER: The liver is  normal in size and contour. Hypoattenuation within the right hepatic lobe is incompletely assessed and may be artifact related to patient's  arm positioning. Vascular compromise cannot be assessed on this noncontrast exam. No focal liver lesion on non-contrast examination. BILIARY: Gallbladder is distended adequately with concentrated bile/sludge otherwise is unremarkable. No intrahepatic biliary ductal dilatation. SPLEEN: Normal in size and contour. PANCREAS: Normal pancreatic contour without signs of inflammation or gross ductal dilatation. ADRENAL GLANDS: Normal appearance of the adrenal glands. KIDNEYS/URETERS: Smooth renal contours. Subcentimeter hyperattenuating lesion in the inferior pole of the left kidney, likely represents proteinaceous/hemorrhagic cyst (3:70). No nephrolithiasis.  No ureteral dilatation or collecting system distention. BLADDER: Unremarkable. REPRODUCTIVE ORGANS: Mild prostamegaly with punctate calcification. The scrotum was not included in the field-of-view. However varicoceles are noted on the left. GI TRACT: Multiple dilated air and fluid-filled loops of small bowel throughout the abdomen measuring up to 4.6 cm with distal tapering. No discrete transition point identified. Right hemicolectomy. No acute inflammation.  PERITONEUM/RETROPERITONEUM AND MESENTERY: No free air. No ascites. No fluid collection. VASCULATURE: Normal caliber aorta. There is enlargement of the infrahepatic/supra renal IVC (3:59) incompletely assessed without contrast. Otherwise, limited evaluation without contrast. Small caliber perisplenic varices. Left internal iliac vein varices. Mild scattered atherosclerotic disease of the aorta and its branch vessels. LYMPH NODES: Enlarged right aortocava retroperitoneal lymph node measuring up to 1.7 cm (2:62). BONES and SOFT TISSUES: No aggressive osseous lesions. No focal soft tissue lesions. Diffuse body wall edema.   --Examination is technically limited without  intravenous contrast. Within the confines of this exam: --There is relative geographic hypoattenuation of the right hepatic lobe which may be artifact related to patient arm position. However, vascular compromise cannot entirely be excluded. - Multiple distended loops of gas and fluid-filled loops of small bowel without appreciable transition point and may reflect ileus, indeterminant. - Lymphadenopathy with enlarged conglomeration of lymph nodes along the retroperitoneum at the level of the renal hila worrisome for pathologic/malignant nodal disease. --Mild anasarca. --The scrotum was not included in the field-of-view however there is a moderate size left varicocele. Right superficial venous varicosities also are present. --Global cardiomegaly.   XR Abdomen 1 View Result Date: 12/03/2023 EXAM: XR ABDOMEN 1 VIEW ACCESSION: [id number] UN REPORT DATE: 12/03/2023 4:20 PM CLINICAL INDICATION: 68 years old with CONSTIPATION  COMPARISON: Abdominal radiograph 12/01/2023 TECHNIQUE: Supine view of the abdomen, 2 image(s) FINDINGS: Multiple loops of dilated small bowel measuring up to 4.2 cm. No acute osseous abnormality. Soft tissues are unremarkable. Lung bases are clear.   Multiple loops of dilated small bowel, which may suggest ileus versus obstruction. Consider cross-sectional imaging for further evaluation if clinically warranted.   US  Scrotum Result Date: 12/02/2023 EXAM: US  SCROTUM ACCESSION: [id number] UN REPORT DATE: 12/02/2023 3:18 AM CLINICAL INDICATION: 68 years old with scrotal hematoma COMPARISON: Scrotal ultrasound 11/27/2023 and multiple priors TECHNIQUE:  Ultrasound views of the scrotum were obtained using gray scale and color Doppler imaging. Spectral Doppler imaging was also performed. FINDINGS: TESTICLES: The testes were uniform in echotexture. Redemonstrated heterogeneous left scrotal collection measuring approximately 9.6 x 7.8 x 7.6 cm (previously 9.5 x 5.6 x 7.8 cm), stable to slightly enlarged  from prior. There is mass effect on the left testicle and to a similar degree compared to prior. Appropriate arterial inflow and venous outflow of the testicles was documented with color and spectral Doppler imaging. Pulsatile venous waveforms noted in the left testicle.      Right testicle: Sagittal 3.1 cm; AP 2.2 cm; Transverse 2.1 cm      Left testicle: Sagittal 3.7 cm; AP 1.5 cm; Transverse 2.3 cm EPIDIDYMIS:  Appropriate flow was seen  in the epididymal heads.      Right epididymis head: 0.8 cm      Left epididymis head: Not visualized OTHER: Unchanged right hydrocele with debris. Bilateral scrotal wall edema.   - Unchanged size and mass effect of the large left scrotal collection. Arterial and venous waveforms are present within the testicle. - Unchanged right hydrocele with debris. - Similar scrotal wall edema.   XR Abdomen 1 View Result Date: 12/01/2023 EXAM: XR ABDOMEN 1 VIEW ACCESSION: [id number] UN REPORT DATE: 12/01/2023 12:05 PM CLINICAL INDICATION: 68 years old with CONSTIPATION  COMPARISON: 11/30/2023 TECHNIQUE: Supine view of the abdomen, 3 image(s) FINDINGS: Stable cardiomegaly. Similar appearance of air-filled nondilated small and large bowel, likely dysmotility/ileus. Minimal stool burden. Osseous degenerative changes.   Similar appearance of air-filled nondilated small and large bowel, likely dysmotility/ileus. Minimal stool burden.   XR Abdomen 1 View Result Date: 11/30/2023 EXAM: XR ABDOMEN 1 VIEW ACCESSION: [id number] UN REPORT DATE: 11/30/2023 3:29 PM CLINICAL INDICATION: 68 years old with ABDOMINAL PAIN  -  OTHER SPECIFIED SITE  COMPARISON: Ultrasound 11/25/2023 TECHNIQUE: Semiupright and supine views of the abdomen, 3 image(s) FINDINGS: Superior potential venous catheter with tip overlying the superior vena cava. Partially imaged cardiomegaly. Visualized lungs are clear. Air-filled mildly dilated small large bowel, likely dysmotility/ileus. Mild osseous degenerative changes.    Air-filled mildly dilated small large bowel, likely dysmotility/ileus.   XR Chest Portable Result Date: 11/28/2023 EXAM: XR CHEST PORTABLE ACCESSION: [id number] UN REPORT DATE: 11/28/2023 7:56 AM CLINICAL INDICATION: LINE CHECK (CATHETER VASCULAR FIT)  TECHNIQUE: Single View AP Chest Radiograph. COMPARISON: 11/27/2023 FINDINGS: Right internal jugular Swan-Ganz catheter tip in the distal right pulmonary artery. Left subclavian port catheter remains in place. Prominent pulmonary vasculature. Otherwise, lungs are clear. No pleural effusion or pneumothorax. Stable enlarged cardiac silhouette.   *  Swan-Ganz catheter tip in the distal right pulmonary artery. *  Cardiomegaly with mild pulmonary vascular congestion.  ECG 12 Lead Result Date: 11/27/2023 ATRIAL FIBRILLATION WITH  PREMATURE VENTRICULAR BEATS RIGHT BUNDLE BRANCH BLOCK LOW VOLTAGE QRS CANNOT RULE OUT INFERIOR INFARCT , AGE UNDETERMINED ABNORMAL ECG WHEN COMPARED WITH ECG OF 24-Nov-2023 16:03, PREMATURE VENTRICULAR BEATS ARE NOW PRESENT Confirmed by Leni Mings ([id number]) on 11/27/2023 9:53:59 PM  ECG 12 Lead Result Date: 11/27/2023 WIDE QRS RHYTHM RIGHT BUNDLE BRANCH BLOCK CANNOT RULE OUT INFERIOR INFARCT , AGE UNDETERMINED ABNORMAL ECG WHEN COMPARED WITH ECG OF 25-Nov-2023 12:16, PREVIOUS ECG HAS UNDETERMINED RHYTHM, NEEDS REVIEW Confirmed by Claudene Legions ([id number]) on 11/27/2023 9:23:05 PM  ECG 12 Lead Result Date: 11/27/2023 ATRIAL FIBRILLATION WITH SLOW VENTRICULAR RESPONSE RIGHT BUNDLE BRANCH BLOCK LOW VOLTAGE QRS ABNORMAL ECG WHEN COMPARED WITH ECG OF 21-Nov-2023 11:00, NO SIGNIFICANT CHANGE WAS FOUND Confirmed by Leni Mings ([id number]) on 11/27/2023 9:00:56 PM  Cath/Vascular Procedure Result Date: 11/27/2023 FINAL CARDIAC CATHETERIZATION REPORT CONCLUSIONS: - Very elevated right sided pressures (RA 25 mmHg, mPAP 43 mmHg) and pulmonary capillary wedge pressure (22 mmHg). - Severe pulmonary hypertension. - High cardiac output/index (10.1/4.5 by  Fick; 7.1/3.2 by thermodilution) - Successful placement of leave-in pulmonary artery catheter. RECOMMENDATIONS: - Per CICU team. PATIENT NAME: Alen Codding INDICATION: 68 year old male with acute right heart failure PROCEDURE DATE: 2023-11-27 ACCESS SITE: right internal jugular vein PHYSICIANS: Franky Cutting (Attending), Sayyad Kyazimzade (Fellow - Interventional), Lang Alert (Fellow - Diagnostic) REFERRING: Norleen Grumbling Diagnostic procedures: right heart cath Contrast Used (ml): none reported DIAGNOSTIC FINDINGS: Hemodynamics: BP / Ao (mmHg): 114/64  Mean: 75  Right Heart: RA: (Mean): 25  (a-wave): 29  (v-wave): 33  RV: 75/14 PA: 74/30  Mean: 43 Wedge: (Mean): 22  (a-wave): 26  (v-wave): 38 Ao sat(%): 98  PA sat(%): 66 Cardiac Output (Fick): 10.1 L/Min Cardiac Index (Fick): 4.5 L/Min/M^2 SVR (Fick): 396 PVR (Fick): 2.08 Cardiac Output (thermal): 7.1 L/MinCardiac Index (thermal): 3.2 L/Min/M^2 SVR (thermal): 563PVR (thermal): 2.96 Technique: Right Heart Catheterization with Leave-In Swan-Ganz Catheter (Right Internal Jugular) The right internal jugular vein was prepped and draped in usual sterile fashion. The right internal jugular vein area was anesthetized locally with lidocaine  injection. An ultrasound probe and finder needle was used to identify the right internal jugular vein and a micropuncture needle was then directed into the vessel via ultrasound guidance. Once blood return was achieved, sequential dilatation of the vessel via micropuncture dilator was performed, with ultimate placement of a 8.1F sheath introducer. The pulmonary artery catheter was then advanced in the right heart and pulmonary artery with the balloon inflated until wedge position was achieved. Position was noted by following hemodynamic tracings. Pressure was recorded in the RA, RV, PA and wedge positions. PA saturation was measured. Thermodilution cardiac output was performed at least in triplicate. The catheter was sutured in place, and  the patient was returned to the cardiac ICU with PA catheter in place. There were no immediate post-procedure complications. Catheters used: 69F balloon catheter Complications: none reported Estimated blood loss: < 30 cc Radiation: Fluoro time (min): 4.9, Air Kerma Dose (mGy): 66.7, DAP (Gy-cm2): 19.6 I have reviewed the recent history physical and documentation. I personally spent 37 minutes continuously monitoring the patient face to face during the administration of moderate sedation. Independent observer RN was present for the duration of the procedure to assist in patient monitoring. Pre and post sedation activities have been reviewed. I (Dr. Franky Cutting) was present for the entire procedure. Franky FELIX Cutting, MD, [facility], [facility] Assistant Professor of Medicine Division of Cardiology [facility]   XR Chest Portable Result Date: 11/27/2023 EXAM: XR CHEST PORTABLE ACCESSION: [id number] UN REPORT DATE: 11/27/2023 1:19 PM CLINICAL INDICATION: LINE CHECK (CATHETER VASCULAR FIT)  TECHNIQUE: Single View AP Chest Radiograph. COMPARISON: 11/19/2023 FINDINGS: Left internal jugular port catheter remains in place. New right internal jugular Swan-Ganz catheter with tip at the level of the mid/distal right pulmonary artery. Lungs are clear.  No pleural effusion or pneumothorax. Stable enlarged cardiac silhouette.   *  Swan-Ganz catheter tip in the mid to distal right pulmonary artery. *  Stable cardiomegaly.  US  Scrotum Result Date: 11/27/2023 EXAM: US  SCROTUM ACCESSION: [id number] UN REPORT DATE: 11/27/2023 7:24 AM CLINICAL INDICATION: 68 years old with eval scrotal hematoma, cf torsion COMPARISON: Scrotal ultrasound 11/25/2023, 11/24/2023 TECHNIQUE:  Ultrasound views of the scrotum were obtained using gray scale and color Doppler imaging. Spectral Doppler imaging was also performed. FINDINGS: TESTICLES: The testes were uniform in echotexture. Redemonstrated large complex left scrotal collection  with associated mass effect on the left testicle, although to a lesser extent than prior. The collection measures 9.5 x 5.9 x 7.8 cm from 11.3 x 8.8 x 9.6 cm on prior examination. Appropriate arterial inflow and venous outflow of the testicles was documented with color and spectral Doppler imaging. Pulsatile venous waveforms noted.      Right testicle: Sagittal 3.7 cm; AP 2.4 cm; Transverse 2.2 cm      Left testicle: Sagittal 4.5 cm; AP 2 cm; Transverse 2.1 cm EPIDIDYMIS:  Appropriate flow was seen in the epididymal heads.      Right epididymis head: 0.96 cm  Left epididymis head: 0.67 cm, poorly visualized OTHER: Unchanged right hydrocele with debris. Bilateral scrotal wall edema.   - Decreased size and mass effect of the large complex left scrotal collection. Arterial and venous waveforms are present within the testicle. - Unchanged right hydrocele with debris. - Similar scrotal wall edema.   Echocardiogram W Colorflow Spectral Doppler Result Date: 11/26/2023 Patient Info Name:     Chris George Age:     64 years DOB:     1956-06-18 Gender:     Male MRN:     [id number] Accession #:     [id number] UN Account #:     [id number] Ht:     175 cm Wt:     111 kg BSA:     2.37 m2 BP:     91 /     35 mmHg Exam Date:     11/26/2023 8:43 AM Admit Date:     11/19/2023 Exam Type:     ECHOCARDIOGRAM W COLORFLOW SPECTRAL DOPPLER Technical Quality:     Good Staff Sonographer:     Nidia Pages Referring Physician:     Prentice SHAUNNA Search Reading Fellow:     Dorn Kapur Study Info Indications      - Concern for worsening systolic dysfunction,  hypervolemia, hypotension,  concern for cardiogenic shock Procedure(s)   Complete two-dimensional, color flow and Doppler transthoracic echocardiogram is performed. Summary   1. The left ventricle is mildly dilated in size with moderately increased wall thickness.   2. The left ventricular systolic function is normal, LVEF is visually estimated at 50-55%.   3. The mitral valve  leaflets are mildly thickened with normal leaflet mobility.   4. The right ventricle is moderately to severely dilated in size, with mildly reduced systolic function.   5. There is moderate pulmonic regurgitation.   6. There is moderate mitral valve regurgitation.   7. The tricuspid valve leaflets are normal, with restricted leaflet mobility and a 0.8 cm coaptation gap with severe tricuspid regurgitation.   8. The right atrium is severely dilated in size.   9. The aorta at the ascending aorta is mildly dilated.   10. IVC size and inspiratory change suggest elevated right atrial pressure. (10-20 mmHg). Left Ventricle   The left ventricle is mildly dilated in size with moderately increased wall thickness. The left ventricular systolic function is normal, LVEF is visually estimated at 50-55%. Left ventricular diastolic function cannot be accurately assessed. Right Ventricle   The right ventricle is moderately to severely dilated in size, with mildly reduced systolic function. Ventricular Septum   Abnormal ventricular septal motion consistent with RV pressure and volume overload. Left Atrium   The left atrium is moderately dilated in size. Right Atrium   The right atrium is severely dilated in size. Aortic Valve   The aortic valve is trileaflet with mildly thickened leaflets with normal excursion. There is no significant aortic regurgitation. There is no evidence of a significant transvalvular gradient. Mitral Valve   The mitral valve leaflets are mildly thickened with normal leaflet mobility. There is moderate mitral valve regurgitation. Tricuspid Valve   The tricuspid valve leaflets are normal, with restricted leaflet mobility and a 0.8 cm coaptation gap with severe tricuspid regurgitation. The pulmonary systolic pressure cannot be estimated due to severe TR. Pulmonic Valve   The pulmonic valve is poorly visualized, but probably normal. There is moderate pulmonic regurgitation. There is no evidence of a significant  transvalvular gradient. Aorta   The aorta at  the ascending aorta is mildly dilated. Inferior Vena Cava   IVC size and inspiratory change suggest elevated right atrial pressure. (10-20 mmHg). Pericardium/Pleural   There is no pericardial effusion. Ventricles ---------------------------------------------------------------------- Name                                 Value        Normal ---------------------------------------------------------------------- LV Dimensions 2D/MM ----------------------------------------------------------------------  IVS Diastolic Thickness (2D)                                1.6 cm       0.6-1.0 LVID Diastole (2D)                  6.2 cm       4.2-5.8  LVPW Diastolic Thickness (2D)                                1.6 cm       0.6-1.0 LVID Systole (2D)                   4.5 cm       2.5-4.0 LV Mass Index (2D Cubed)          208 g/m2        49-115  Relative Wall Thickness (2D)                                  0.52        <=0.42 RV Dimensions 2D/MM ----------------------------------------------------------------------  RV Basal Diastolic Dimension                           6.1 cm       2.5-4.1 TAPSE                               2.0 cm         >=1.7 Atria ---------------------------------------------------------------------- Name                                 Value        Normal ---------------------------------------------------------------------- LA Dimensions ---------------------------------------------------------------------- LA Dimension (2D)                   6.0 cm       3.0-4.1 LA Volume Index (4C A-L)        48.09 ml/m2               LA Volume Index (2C A-L)        76.94 ml/m2               LA Volume (BP MOD)                  144 ml               LA Volume Index (BP MOD)        60.69 ml/m2   16.00-34.00 RA Dimensions ---------------------------------------------------------------------- RA Area (4C)  59.0 cm2        <=18.0 RA Area (4C) Index              24.9  cm2/m2               RA ESV Index (4C MOD)            128 ml/m2         18-32 Aortic Valve ---------------------------------------------------------------------- Name                                 Value        Normal ---------------------------------------------------------------------- AV Doppler ---------------------------------------------------------------------- AV Peak Velocity                   1.2 m/s               AV Peak Gradient                    6 mmHg Mitral Valve ---------------------------------------------------------------------- Name                                 Value        Normal ---------------------------------------------------------------------- MV Regurgitation Doppler ---------------------------------------------------------------------- MR Peak Velocity                   4.5 m/s               MR VTI                              143 cm               MV Annular TDI ---------------------------------------------------------------------- MV Septal e' Velocity             6.1 cm/s         >=8.0 MV Lateral e' Velocity           11.5 cm/s        >=10.0 MV e' Average                     8.8 cm/s Tricuspid Valve ---------------------------------------------------------------------- Name                                 Value        Normal ---------------------------------------------------------------------- TV Regurgitation Doppler ---------------------------------------------------------------------- TR Peak Velocity                   3.2 m/s               Estimated PAP/RSVP ---------------------------------------------------------------------- RA Pressure                        18 mmHg           <=5 RV Systolic Pressure               60 mmHg           <36 Pulmonic Valve ---------------------------------------------------------------------- Name                                 Value        Normal ---------------------------------------------------------------------- PV  Doppler  ---------------------------------------------------------------------- PV Peak Velocity                   1.1 m/s Aorta ---------------------------------------------------------------------- Name                                 Value        Normal ---------------------------------------------------------------------- Ascending Aorta ---------------------------------------------------------------------- Ao Root Diameter (2D)               3.6 cm               Ao Root Diam Index (2D)          1.5 cm/m2               Ascending Aorta Diameter            4.2 cm Venous ---------------------------------------------------------------------- Name                                 Value        Normal ---------------------------------------------------------------------- IVC/SVC ---------------------------------------------------------------------- IVC Diameter (Exp 2D)               3.4 cm         <=2.1 Report Signatures Finalized by Toni Cool on 11/26/2023 11:53 AM Resident Dorn Kapur on 11/26/2023 10:52 AM  ECG 12 Lead Result Date: 11/26/2023 ATRIAL FIBRILLATION WITH PVCs RIGHT BUNDLE BRANCH BLOCK ABNORMAL ECG WHEN COMPARED WITH ECG OF 19-Nov-2023 14:23, Premature ventricular contractions ARE NOW PRESENT Confirmed by Sedalia Bring ([id number]) on 11/26/2023 11:32:35 AM  US  Scrotum Result Date: 11/25/2023 EXAM: US  SCROTUM ACCESSION: [id number] UN REPORT DATE: 11/25/2023 9:40 AM CLINICAL INDICATION: 68 years old with monitor scotal hematoma COMPARISON: Scrotal ultrasound dated 11/24/2023 TECHNIQUE:  Ultrasound views of the scrotum were obtained using gray scale and color Doppler imaging. Spectral Doppler imaging was also performed. FINDINGS: TESTICLES: Redemonstration of a large complex left scrotal collection with mass effect on the left testicle which appears compressed to the periphery somewhat limiting evaluation of the testicular parenchyma. The collection measures approximately 11.3 x 8.8 x 9.6 cm, previously 10.1 x  6.5 x 6.9 cm. No focal intratesticular mass appreciated. Appropriate arterial inflow and venous outflow of the testicles was documented with color and spectral Doppler imaging.       Right testicle: Sagittal 3.8 cm; AP 2 cm; Transverse 2.5 cm      Left testicle: Sagittal 4.0 cm; AP 1.6 cm; Transverse 2.6 cm EPIDIDYMIS:  Appropriate flow was seen in the right epididymal head. Left epididymis is poorly visualized.      Right epididymis head: 1.3 cm      Left epididymis head: 0.8 cm OTHER: Similar large right hydrocele with debris. Scrotal wall edema with increased vascularity.   --Increased size of a large complex heterogeneous left scrotal collection, possibly hematoma, with mass effect on the left testicle. While venous and arterial waveforms are noted on this examination, close attention with follow-up ultrasound is recommended if not surgically managed. --Similar large right hydrocele with debris. --Scrotal wall edema with increased vascularity, correlate with physical exam for cellulitis.   US  Renal Complete Result Date: 11/25/2023 EXAM: US  RENAL COMPLETE ACCESSION: [id number] UN REPORT DATE: 11/25/2023 9:36 AM CLINICAL INDICATION: 68 years old with rule out obstructive nephropathy  COMPARISON: None. TECHNIQUE: Static and cine images of the kidneys and bladder were performed. FINDINGS: KIDNEYS: Normal size  and echogenicity. No solid masses or calculi. No hydronephrosis.      Right kidney: 10.2 cm      Left kidney: 9.6 cm BLADDER: Unremarkable.      Bladder volume prevoid: 91.5 mL      Bladder volume postvoid: mL   No hydronephrosis.   US  Scrotum Result Date: 11/24/2023 EXAM: US  SCROTUM ACCESSION: [id number] UN REPORT DATE: 11/24/2023 7:52 AM CLINICAL INDICATION: 68 years old with left testicular pain. COMPARISON: None TECHNIQUE:  Ultrasound views of the scrotum were obtained using gray scale and color Doppler imaging. Spectral Doppler imaging was also performed. FINDINGS: TESTICLES: The testes were  uniform in echotexture. No focal masses were seen. Appropriate arterial inflow and venous outflow of the right testicle was documented with color and spectral Doppler imaging. Appropriate arterial inflow was documented in the left testicle. However there is no venous outflow visualized in the left testicle.      Right testicle: Sagittal 4.2 cm; AP 3.8 cm; Transverse 2.2 cm      Left testicle: Sagittal 4.4 cm; AP 1.9 cm; Transverse 2.4 cm EPIDIDYMIS:  Appropriate flow was seen in the epididymal heads.      Right epididymis head: 0.63 cm      Left epididymis head: Not visualized due to large collection in the left hemiscrotum. OTHER: Large complex right hydrocele. In the left hemiscrotum is a large complex collection measuring 10.1 x 6.5 x 6.9 cm. There is no vascularity visualized within the collection and minimal peripheral vascularity.   - Large left hemiscrotum hematoma with mass effect on the left testicle. No venous outflow was documented in the left testicle, findings may represent venous compression preventing venous outflow versus testicular torsion. - The right testicle is within normal limits. - Large complex right hydrocele. The findings of this study were discussed via telephone with DR. Adem by Dr. Victory Downing on 11/24/2023 8:18 AM.   ECG 12 Lead Result Date: 11/19/2023 WIDE QRS RHYTHM INDETERMINATE AXIS RIGHT BUNDLE BRANCH BLOCK ABNORMAL ECG WHEN COMPARED WITH ECG OF 05-Apr-2023 14:34, WIDE QRS RHYTHM HAS REPLACED ATRIAL FIBRILLATION VENT. RATE HAS INCREASED by  37 bpm Confirmed by Claudene Legions ([id number]) on 11/19/2023 8:21:54 PM  XR Chest 2 views Result Date: 11/19/2023 EXAM: XR CHEST 2 VIEWS ACCESSION: [id number] UN REPORT DATE: 11/19/2023 2:47 PM CLINICAL INDICATION: CHEST PAIN ; Chest Pain  TECHNIQUE: PA and Lateral Chest Radiographs. COMPARISON: None FINDINGS: Left chest wall Port-A-Cath with its tip in mid SVC. Lungs hypoinflated. Mild cephalization of pulmonary vasculature. Minimal bibasilar  subsegmental atelectasis. No pleural effusion or pneumothorax. Cardiomegaly.   Mild cephalization of pulmonary vasculature, may represent early pulmonary edema. Bilateral medial basilar subsegmental atelectasis.   Discharge Instructions:   Other Instructions     Discharge instructions     You were treated at [facility] for significant fluid overload related to a leaky heart valve.  This can cause symptoms such as shortness of breath and swelling in your legs.  You were treated for short while in the cardiac ICU with medicines to help your heart pump properly, as well as medicines to help pee the fluid off of your heart (also known as diuretics).  We continued diuretics throughout your hospitalization while you regained your strength.    Extra fluid is worsened by too much salt and fluid in your body. When you have too much salt and fluid, this makes your heart work much harder. Because your heart is weaker than normal (heart failure) it can't keep up with the extra work.  This causes the extra fluid to build up in your legs causing swelling and in your lungs causing you to be very short of breath. We have worked hard during your hospital stay to get the fluid and salt out of your body to reduce the work on your heart. You will continue on many medications that help your heart work better and also keep fluid off. It is very important that you continue to be compliant with your regimen to improve your heart function and quality of life.    To summarize your hospital stay further to relay to your other doctors - you were admitted for severe tricuspid regurgitation (leaky valve) and RV dysfunction (weakened heart muscle). We treated you with diuretic medicine and pulled off 50lbs or more of fluid. Your kidney function worsened some from all these changes, but ultimately became stable with a creatinine around 2.7. This is up from your baseline of 1.5 or so, but it is now stable and we will continue to closely  monitor this. Moving forward, we will continue to adjust your diuretic (Bumex ) based on labs, symptoms, and weights.    Following are important information and instructions relative to your  - Your latest measure of heart function:  LVEF (ejection fraction) is 50-55%, your right ventricle is a little weak (related to the leaky valve) - ACE/ARB: NOT indicated - Beta Blocker: NOT indicated for you because of low heart rates - Your other medicines (if any) are listed separately in the discharge instructions. - Continue not to smoke. Do not start smoking.                 If you have more questions about this, call 1-800-Quit-Now or              speak with your primary care physician (PCP). - Consume a low salt diet (less than 3 grams of sodium daily) - You should continue to be as active as tolerated. - Follow up with your PCP and / or cardiologist within 7 days (see 'Appointments' section). - Go home and weigh yourself. This is your dry weight. DO THIS IN THE MORNING WITH NO CLOTHES ON AFTER USING THE RESTROOM EVERYDAY.  - If your weight is going up more than 2-3lbs per day or is up over 5lbs in a week, call Dr. Dorien office and take an extra dose of Bumex . If you notice significant swelling or shortness of breath, also take an extra Bumex  and call Dr. Dorien office, even if your weight is the same.   - If you feel lightheaded or dizzy please do not take your lasix  and call your Cardiologist or primary care physician.       Follow Up instructions and Outpatient Referrals    Ambulatory Referral to Home Health     Reason for referral: 68 yo male with HF and s/p CVA   Physician to follow patient's care: PCP   Disciplines requested:  Physical Therapy Occupational Therapy Home Health Aide     Physical Therapy requested: Evaluate and treat   Occupational Therapy Requested: Evaluate and treat   Requested follow up plan: You would evaluate and manage.   Ambulatory Referral to Physical  Medicine Rehab     Reason for referral: Stroke follow up   Discharge instructions      Appointments which have been scheduled for you    Dec 17, 2023 9:20 AM RETURN CARDIOLOGY with Margery Ruth, MD [facility] CARDIOLOGY AT [facility] ([facility])  [address] [place] [address] [phone]         Length of Discharge: I spent greater than 30 mins in the discharge of this patient.  Darlina Maxon, M.D. PGY-1 Department of Neurology Pager: [phone]

## 2023-12-18 ENCOUNTER — Encounter: Payer: Self-pay | Admitting: Oncology

## 2023-12-18 ENCOUNTER — Other Ambulatory Visit: Payer: Medicare HMO

## 2023-12-18 ENCOUNTER — Ambulatory Visit: Payer: Medicare HMO

## 2023-12-18 ENCOUNTER — Ambulatory Visit: Payer: Medicare HMO | Admitting: Oncology

## 2023-12-20 ENCOUNTER — Encounter: Payer: Self-pay | Admitting: Oncology

## 2023-12-24 ENCOUNTER — Ambulatory Visit (INDEPENDENT_AMBULATORY_CARE_PROVIDER_SITE_OTHER): Payer: Medicare Other | Admitting: Urology

## 2023-12-24 ENCOUNTER — Encounter: Payer: Self-pay | Admitting: Urology

## 2023-12-24 VITALS — BP 87/50 | HR 81 | Ht 69.0 in | Wt 176.0 lb

## 2023-12-24 DIAGNOSIS — S3022XA Contusion of scrotum and testes, initial encounter: Secondary | ICD-10-CM

## 2023-12-24 DIAGNOSIS — C61 Malignant neoplasm of prostate: Secondary | ICD-10-CM

## 2023-12-24 MED ORDER — TRAMADOL HCL 50 MG PO TABS: 25.0000 mg | ORAL_TABLET | Freq: Four times a day (QID) | ORAL | 0 refills | Status: DC | PRN

## 2023-12-24 NOTE — Progress Notes (Signed)
   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  I saw him in early January 2025 and scrotal ultrasound showed a left-sided varicocele and he opted for conservative management.  He was hospitalized for fluid overload at [facility] in January 2025.  Apparently he got out of his bed and had an acute episode of severe left scrotal pain with significant swelling, and scrotal ultrasound showed a spontaneous left scrotal hematoma measuring 9 cm.  [facility] urology was consulted and recommended conservative evaluation and holding blood thinners temporarily.  A follow-up scrotal ultrasound 1 week later showed stable hematoma.  He continues to have some left-sided scrotal pain, and on exam has significant left scrotal swelling consistent with persistent hematoma.  We reviewed treatment options including conservative management with snug fitting underwear, icing, and rest versus surgical exploration.  Unfortunately with his comorbidities as well as timing of ~3 weeks out from acute event, I think a scrotal exploration may just prolong his recovery.  He and his brother are in agreement and agree with conservative management.  He was also found to have recurrence of lymphadenopathy on a CT scan at [facility], and has follow-up with [facility].  Unclear if this is recurrent prostate cancer or colon cancer, or related to his prior myeloma.  Need to have reasonable expectations moving forward with his comorbidities and multiple malignancies.  Most recent PSA was 4.65 from December 2024 which is decreased from 42 at time of diagnosis.  -Conservative treatment for large left scrotal hematoma with scrotal support, tramadol sent in,  return precautions discussed -RTC 4-6 weeks symptom check -Further evaluation of lymphadenopathy per oncology with his history of both colon and prostate cancer   I spent 45 total minutes on the day of the encounter including pre-visit review of the medical record, face-to-face time with the patient, and post visit ordering of labs/imaging/tests.  Extensive [facility] records and imaging reviewed.  Sondra Come, MD  [facility] [address], [address] [place], [place] [address] [phone]

## 2023-12-25 ENCOUNTER — Other Ambulatory Visit: Payer: Medicare HMO

## 2023-12-25 ENCOUNTER — Ambulatory Visit: Payer: Medicare HMO | Admitting: Oncology

## 2023-12-26 ENCOUNTER — Other Ambulatory Visit: Payer: Self-pay | Admitting: *Deleted

## 2023-12-26 DIAGNOSIS — D509 Iron deficiency anemia, unspecified: Secondary | ICD-10-CM

## 2023-12-26 NOTE — Progress Notes (Signed)
hol

## 2023-12-27 ENCOUNTER — Encounter: Payer: Self-pay | Admitting: Oncology

## 2023-12-27 ENCOUNTER — Inpatient Hospital Stay: Payer: Medicare Other | Admitting: Oncology

## 2023-12-27 ENCOUNTER — Other Ambulatory Visit: Payer: Self-pay | Admitting: Oncology

## 2023-12-27 ENCOUNTER — Inpatient Hospital Stay: Payer: Medicare Other | Attending: Oncology

## 2023-12-27 VITALS — BP 90/60 | HR 53 | Temp 97.5°F | Resp 16 | Ht 69.0 in | Wt 180.9 lb

## 2023-12-27 DIAGNOSIS — N5089 Other specified disorders of the male genital organs: Secondary | ICD-10-CM | POA: Diagnosis not present

## 2023-12-27 DIAGNOSIS — D696 Thrombocytopenia, unspecified: Secondary | ICD-10-CM | POA: Insufficient documentation

## 2023-12-27 DIAGNOSIS — C189 Malignant neoplasm of colon, unspecified: Secondary | ICD-10-CM | POA: Insufficient documentation

## 2023-12-27 DIAGNOSIS — D509 Iron deficiency anemia, unspecified: Secondary | ICD-10-CM

## 2023-12-27 DIAGNOSIS — D472 Monoclonal gammopathy: Secondary | ICD-10-CM | POA: Insufficient documentation

## 2023-12-27 DIAGNOSIS — F419 Anxiety disorder, unspecified: Secondary | ICD-10-CM | POA: Insufficient documentation

## 2023-12-27 DIAGNOSIS — G629 Polyneuropathy, unspecified: Secondary | ICD-10-CM | POA: Diagnosis not present

## 2023-12-27 DIAGNOSIS — N289 Disorder of kidney and ureter, unspecified: Secondary | ICD-10-CM | POA: Diagnosis not present

## 2023-12-27 DIAGNOSIS — C61 Malignant neoplasm of prostate: Secondary | ICD-10-CM | POA: Diagnosis not present

## 2023-12-27 DIAGNOSIS — D649 Anemia, unspecified: Secondary | ICD-10-CM | POA: Insufficient documentation

## 2023-12-27 DIAGNOSIS — C184 Malignant neoplasm of transverse colon: Secondary | ICD-10-CM

## 2023-12-27 LAB — CBC WITH DIFFERENTIAL/PLATELET
Abs Immature Granulocytes: 0 10*3/uL (ref 0.00–0.07)
Basophils Absolute: 0 10*3/uL (ref 0.0–0.1)
Basophils Relative: 1 %
Eosinophils Absolute: 0.1 10*3/uL (ref 0.0–0.5)
Eosinophils Relative: 3 %
HCT: 23.1 % — ABNORMAL LOW (ref 39.0–52.0)
Hemoglobin: 7.4 g/dL — ABNORMAL LOW (ref 13.0–17.0)
Immature Granulocytes: 0 %
Lymphocytes Relative: 11 %
Lymphs Abs: 0.4 10*3/uL — ABNORMAL LOW (ref 0.7–4.0)
MCH: 28.6 pg (ref 26.0–34.0)
MCHC: 32 g/dL (ref 30.0–36.0)
MCV: 89.2 fL (ref 80.0–100.0)
Monocytes Absolute: 0.5 10*3/uL (ref 0.1–1.0)
Monocytes Relative: 13 %
Neutro Abs: 2.6 10*3/uL (ref 1.7–7.7)
Neutrophils Relative %: 72 %
Platelets: 143 10*3/uL — ABNORMAL LOW (ref 150–400)
RBC: 2.59 MIL/uL — ABNORMAL LOW (ref 4.22–5.81)
RDW: 17.3 % — ABNORMAL HIGH (ref 11.5–15.5)
WBC: 3.6 10*3/uL — ABNORMAL LOW (ref 4.0–10.5)
nRBC: 0 % (ref 0.0–0.2)

## 2023-12-27 LAB — CMP (CANCER CENTER ONLY)
ALT: 29 U/L (ref 0–44)
AST: 33 U/L (ref 15–41)
Albumin: 3 g/dL — ABNORMAL LOW (ref 3.5–5.0)
Alkaline Phosphatase: 263 U/L — ABNORMAL HIGH (ref 38–126)
Anion gap: 10 (ref 5–15)
BUN: 86 mg/dL — ABNORMAL HIGH (ref 8–23)
CO2: 25 mmol/L (ref 22–32)
Calcium: 9.6 mg/dL (ref 8.9–10.3)
Chloride: 97 mmol/L — ABNORMAL LOW (ref 98–111)
Creatinine: 2.74 mg/dL — ABNORMAL HIGH (ref 0.61–1.24)
GFR, Estimated: 25 mL/min — ABNORMAL LOW (ref >60)
Glucose, Bld: 101 mg/dL — ABNORMAL HIGH (ref 70–99)
Potassium: 4.6 mmol/L (ref 3.5–5.1)
Sodium: 132 mmol/L — ABNORMAL LOW (ref 135–145)
Total Bilirubin: 1.1 mg/dL (ref 0.0–1.2)
Total Protein: 8.5 g/dL — ABNORMAL HIGH (ref 6.5–8.1)

## 2023-12-27 LAB — SAMPLE TO BLOOD BANK

## 2023-12-27 LAB — MAGNESIUM: Magnesium: 1.8 mg/dL (ref 1.7–2.4)

## 2023-12-27 NOTE — Progress Notes (Signed)
[facility]  [phone]) [phone] Fax:(336) [phone]  ID: Chris George OB: 29-Dec-1955  MR#: [id number]  QIO#:[id number]  Patient Care Team: Marina Goodell, MD as PCP - General (Family Medicine) [name], MD as Consulting Physician (Oncology) Morene Crocker, MD as Referring Physician (Neurology) Mady Haagensen, MD as Consulting Physician (Nephrology) Lovenia Shuck, MD as Referring Physician (Cardiology)  CHIEF COMPLAINT: Recurrent colon cancer, smoldering myeloma, prostate cancer.  INTERVAL HISTORY: Patient returns to clinic today for further evaluation and hospital follow-up.  He was admitted to [facility] with CHF exasperation for nearly 1 month.  He feels significantly improved and nearly back to his baseline.  He had a significant amount of weight loss which is all fluid.  He has a mild expressive aphasia and a chronic peripheral neuropathy, but no other neurologic complaints.  He denies any recent fevers.  He has a good appetite.  He has no chest pain, cough, or hemoptysis.  He denies any nausea, vomiting, constipation, or diarrhea.  He has no melena or hematochezia.  He has no urinary complaints.  Patient offers no further specific complaints today.  REVIEW OF SYSTEMS:   Review of Systems  Constitutional:  Positive for malaise/fatigue. Negative for fever and weight loss.  Respiratory: Negative.  Negative for cough, hemoptysis and shortness of breath.   Cardiovascular: Negative.  Negative for chest pain and leg swelling.  Gastrointestinal: Negative.  Negative for blood in stool, diarrhea and melena.  Genitourinary: Negative.  Negative for hematuria.  Musculoskeletal: Negative.  Negative for back pain and joint pain.  Skin: Negative.  Negative for rash.  Neurological:  Positive for tingling and sensory change. Negative for dizziness, focal weakness, weakness and headaches.  Psychiatric/Behavioral: Negative.  Negative for depression. The  patient is not nervous/anxious.     As per HPI. Otherwise, a complete review of systems is negative.  PAST MEDICAL HISTORY: Past Medical History:  Diagnosis Date   A-fib (HCC)    Anemia    Arthritis    ankles   Cancer (HCC)    multiple myeloma   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HCC) 10/26/2019   Cardiomyopathy (HCC)    Chemotherapy-induced peripheral neuropathy (HCC)    CHF (congestive heart failure) (HCC)    Depression    Dysrhythmia    atrial fib   History of CVA (cerebrovascular accident)    HLD (hyperlipidemia)    Hypercholesteremia    Hypertension    Moderate tricuspid insufficiency    Multiple myeloma (HCC)    not being treated right now per pt 11/11/19   Pneumonia    Sleep apnea    No CPAP.  OSA resolved with wt loss.    PAST SURGICAL HISTORY: Past Surgical History:  Procedure Laterality Date   ATRIAL ABLATION SURGERY     CARDIAC SURGERY     A-Fib Ablations   COLONOSCOPY WITH PROPOFOL N/A 10/26/2019   Procedure: COLONOSCOPY WITH BIOPSY;  Surgeon: [name], MD;  Location: [facility];  Service: Endoscopy;  Laterality: N/A;   COLONOSCOPY WITH PROPOFOL N/A 09/03/2022   Procedure: COLONOSCOPY WITH PROPOFOL;  Surgeon: [name], MD;  Location: [facility];  Service: Endoscopy;  Laterality: N/A;   ESOPHAGOGASTRODUODENOSCOPY (EGD) WITH PROPOFOL N/A 10/26/2019   Procedure: ESOPHAGOGASTRODUODENOSCOPY (EGD) WITH BIOPSY;  Surgeon: [name], MD;  Location: [facility];  Service: Endoscopy;  Laterality: N/A;  sleep apnea   LAPAROSCOPIC PARTIAL COLECTOMY Right 11/17/2019   Procedure: LAPAROSCOPIC PARTIAL COLECTOMY RIGHT EXTENDED;  Surgeon: Aleen Campi,  Elita Quick, MD;  Location: [facility];  Service: General;  Laterality: Right;   PORTACATH PLACEMENT N/A 12/28/2019   Procedure: INSERTION PORT-A-CATH Left subclavian;  Surgeon: [name], MD;  Location: [facility];  Service: General;  Laterality: N/A;    FAMILY HISTORY: Family History  Problem Relation Age  of Onset   Healthy Mother     ADVANCED DIRECTIVES (Y/N):  N  HEALTH MAINTENANCE: Social History   Tobacco Use   Smoking status: Never    Passive exposure: Never   Smokeless tobacco: Never  Vaping Use   Vaping status: Never Used  Substance Use Topics   Alcohol use: Not Currently   Drug use: Never     Colonoscopy:  PAP:  Bone density:  Lipid panel:  No Known Allergies  Current Outpatient Medications  Medication Sig Dispense Refill   allopurinol (ZYLOPRIM) 100 MG tablet Take 200 mg by mouth daily.     apixaban (ELIQUIS) 5 MG TABS tablet Take 5 mg by mouth 2 (two) times daily.     atorvastatin (LIPITOR) 80 MG tablet Take 80 mg by mouth daily.     bumetanide (BUMEX) 2 MG tablet Take 2 mg by mouth 2 (two) times daily.     colchicine 0.6 MG tablet Take 1 tablet (0.6 mg total) by mouth 2 (two) times daily. Until flare resolves. 20 tablet 1   lidocaine-prilocaine (EMLA) cream Apply 1 Application topically daily.     magnesium chloride (SLOW-MAG) 64 MG TBEC SR tablet Take 1 tablet (64 mg total) by mouth daily. 60 tablet 1   MAGNESIUM PO Take by mouth.     spironolactone (ALDACTONE) 25 MG tablet Take 1 tablet by mouth every morning.     tamsulosin (FLOMAX) 0.4 MG CAPS capsule Take 1 capsule (0.4 mg total) by mouth daily after supper. 30 capsule 6   APPLE CIDER VINEGAR PO Take 30-45 mLs by mouth every other day.  (Patient not taking: Reported on 12/27/2023)     HYDROcodone-acetaminophen (NORCO) 5-325 MG tablet Take 1 tablet by mouth every 6 (six) hours as needed for moderate pain. (Patient not taking: Reported on 12/27/2023) 30 tablet 0   loperamide (IMODIUM A-D) 2 MG tablet Take 2 mg by mouth 4 (four) times daily as needed for diarrhea or loose stools. (Patient not taking: Reported on 12/27/2023)     Misc Natural Products (TART CHERRY ADVANCED PO) Take 60 mLs by mouth every other day. (Patient not taking: Reported on 12/27/2023)     Multiple Vitamins-Minerals (ZINC PO) Take by mouth.  (Patient not taking: Reported on 12/27/2023)     nifedipine 0.3 % ointment Place 1 Application rectally 4 (four) times daily. with Lidocaine 1.5% (Patient not taking: Reported on 12/27/2023) 30 g 0   prochlorperazine (COMPAZINE) 10 MG tablet Take 1 tablet (10 mg total) by mouth every 6 (six) hours as needed for nausea or vomiting. (Patient not taking: Reported on 12/27/2023) 60 tablet 1   tadalafil (CIALIS) 10 MG tablet Take 1-2 tablets (10-20 mg total) by mouth daily as needed for erectile dysfunction (take 45 minutes prior to secual activity). (Patient not taking: Reported on 12/27/2023) 30 tablet 6   traMADol (ULTRAM) 50 MG tablet Take 0.5-1 tablets (25-50 mg total) by mouth every 6 (six) hours as needed for severe pain (pain score 7-10). (Patient not taking: Reported on 12/27/2023) 30 tablet 0   Zinc Sulfate (ZINC 15 PO) Take by mouth. (Patient not taking: Reported on 12/27/2023)     No current facility-administered medications for  this visit.   Facility-Administered Medications Ordered in Other Visits  Medication Dose Route Frequency Provider Last Rate Last Admin   heparin lock flush 100 unit/mL  500 Units Intravenous Once [name], MD        OBJECTIVE: Vitals:   12/27/23 0935  BP: 90/60  Pulse: (!) 53  Resp: 16  Temp: (!) 97.5 F (36.4 C)  SpO2: 100%       Body mass index is 26.71 kg/m.    ECOG FS:1 - Symptomatic but completely ambulatory  General: Well-developed, well-nourished, no acute distress. Eyes: Pink conjunctiva, anicteric sclera. HEENT: Normocephalic, moist mucous membranes. Lungs: No audible wheezing or coughing. Heart: Regular rate and rhythm. Abdomen: Soft, nontender, no obvious distention. Musculoskeletal: No edema, cyanosis, or clubbing. Neuro: Alert, answering all questions appropriately. Cranial nerves grossly intact. Skin: No rashes or petechiae noted. Psych: Normal affect.   LAB RESULTS:  Lab Results  Component Value Date   NA 132 (L)  12/27/2023   K 4.6 12/27/2023   CL 97 (L) 12/27/2023   CO2 25 12/27/2023   GLUCOSE 101 (H) 12/27/2023   BUN 86 (H) 12/27/2023   CREATININE 2.74 (H) 12/27/2023   CALCIUM 9.6 12/27/2023   PROT 8.5 (H) 12/27/2023   ALBUMIN 3.0 (L) 12/27/2023   AST 33 12/27/2023   ALT 29 12/27/2023   ALKPHOS 263 (H) 12/27/2023   BILITOT 1.1 12/27/2023   GFRNONAA 25 (L) 12/27/2023   GFRAA >60 07/29/2020    Lab Results  Component Value Date   WBC 3.6 (L) 12/27/2023   NEUTROABS 2.6 12/27/2023   HGB 7.4 (L) 12/27/2023   HCT 23.1 (L) 12/27/2023   MCV 89.2 12/27/2023   PLT 143 (L) 12/27/2023   Lab Results  Component Value Date   IRON 79 08/13/2023   TIBC 234 (L) 08/13/2023   IRONPCTSAT 34 08/13/2023   Lab Results  Component Value Date   FERRITIN 686 (H) 09/03/2023     STUDIES: No results found.   ONCOLOGY HISTORY: Patient completed cycle 9 of adjuvant FOLFOX on June 03, 2020.  Given his difficulties with treatment and declining performance status, treatment was discontinued altogether. Colonoscopy on September 03, 2022 did not reveal any significant pathology and recommendation was to repeat in 3 years.    ASSESSMENT: Stage IIIc colon cancer, smoldering myeloma, prostate cancer.  PLAN:    Stage IIIc colon cancer:  CT scan from December 07, 2022 reviewed independently with enlarging aortocaval node now measuring 1.9 x 1.4 cm.  PET scan results from Mar 15, 2023 reviewed independently with progressive retroperitoneal lymphadenopathy presumably related to patient's colon cancer.  Patient last received chemotherapy with FOLFIRI on October 15, 2023.  CEA has increased significantly and his most recent result is 327.  Today's result is pending.  Will get restaging PET scan in the next week.  Return to clinic in 1 week to discuss the results and when to reinitiate treatments.    Prostate cancer: Gleason's 3+4.  Patient's PSA increased and peaked at 39.34, but since completing treatment has decreased  and his most recent result was 4.65. Nuclear medicine bone scan on December 13, 2022 was reported as negative.  PSMA PET per radiation oncology from Mar 21, 2023 did not reveal metastatic disease.  Patient completed XRT for local control disease on May 30, 2023.  Repeat imaging as above.  Can consider Eligard in the future.  Continue follow-up with urology as indicated. Smoldering myeloma: Chronic and unchanged.  Patient was initially diagnosed and  treated in [place], [place] and treated with single agent Velcade in approximately 2013.  He declined maintenance treatment or referral for bone marrow transplant.  His M spike has ranged from 1.1-2.0 since July 2020.  His most recent result was 1.6.  Over the same timeframe his IgG component has ranged from [phone].  His most recent result is 2344.  Finally, his kappa free light chains have ranged from 24.8 -75.3 with his most recent result reported at 36.2.  Nuclear med bone scan as above.  His most recent bone marrow biopsy completed on May 28, 2019 revealed only 10 to 15% plasma cells with no clonality reported.  Cytogenetics were also reported as normal.  Continue to monitor closely.  Anemia: Hemoglobin decreased to 7.4.  Return to clinic Monday for 1 unit of packed red blood cells.   Neutropenia: Resolved.  Continue Udenyca with pump removal.  Dose reduced FOLFIRI as above. Thrombocytopenia: Decreased, but stable.  Monitor. Renal insufficiency: Patient's GFR has decreased to 25.  Neither irinotecan or 5-FU need to be dose reduced in the setting of renal insufficiency.  Continue follow-up with nephrology as indicated.   Hypomagnesia: Resolved. Peripheral neuropathy: Chronic and unchanged.  Patient reports gabapentin did not help.  Continue Lyrica 50 mg daily and was previously given a referral to neurology.   Hypotension: Resolved. Coping/anxiety: Patient was previously given a referral to [facility].  Patient reports he talks to them approximately  once per week. CHF exacerbation/edema: Resolved.  Patient was recently at [facility] for nearly a month.  Continue follow-up with cardiology and nephrology. Swollen testicles: Ultrasound results noted.  Follow-up with urology as scheduled.  Patient expressed understanding and was in agreement with this plan. He also understands that He can call clinic at any time with any questions, concerns, or complaints.    [name], MD   12/27/2023 10:34 AM

## 2023-12-28 LAB — PREPARE RBC (CROSSMATCH)

## 2023-12-28 LAB — CEA: CEA: 396 ng/mL — ABNORMAL HIGH (ref 0.0–4.7)

## 2023-12-30 ENCOUNTER — Inpatient Hospital Stay: Payer: Medicare Other

## 2023-12-30 ENCOUNTER — Encounter: Payer: Self-pay | Admitting: Oncology

## 2023-12-30 ENCOUNTER — Ambulatory Visit
Admission: RE | Admit: 2023-12-30 | Discharge: 2023-12-30 | Disposition: A | Discharge status: Home / Self Care | Payer: Medicare Other | Source: Ambulatory Visit | Attending: Oncology | Admitting: Oncology

## 2023-12-30 DIAGNOSIS — N433 Hydrocele, unspecified: Secondary | ICD-10-CM | POA: Insufficient documentation

## 2023-12-30 DIAGNOSIS — R590 Localized enlarged lymph nodes: Secondary | ICD-10-CM | POA: Insufficient documentation

## 2023-12-30 DIAGNOSIS — C184 Malignant neoplasm of transverse colon: Secondary | ICD-10-CM

## 2023-12-30 DIAGNOSIS — I6523 Occlusion and stenosis of bilateral carotid arteries: Secondary | ICD-10-CM | POA: Insufficient documentation

## 2023-12-30 DIAGNOSIS — N400 Enlarged prostate without lower urinary tract symptoms: Secondary | ICD-10-CM | POA: Insufficient documentation

## 2023-12-30 DIAGNOSIS — I700 Atherosclerosis of aorta: Secondary | ICD-10-CM | POA: Insufficient documentation

## 2023-12-30 DIAGNOSIS — C189 Malignant neoplasm of colon, unspecified: Secondary | ICD-10-CM | POA: Diagnosis not present

## 2023-12-30 LAB — GLUCOSE, CAPILLARY: Glucose-Capillary: 83 mg/dL (ref 70–99)

## 2023-12-30 MED ORDER — SODIUM CHLORIDE 0.9% IV SOLUTION
250.0000 mL | INTRAVENOUS | Status: DC
Start: 2023-12-30 — End: 2023-12-30
  Administered 2023-12-30: 100 mL via INTRAVENOUS
  Filled 2023-12-30: qty 250

## 2023-12-30 MED ORDER — ACETAMINOPHEN 325 MG PO TABS
650.0000 mg | ORAL_TABLET | Freq: Once | ORAL | Status: AC
Start: 2023-12-30 — End: 2023-12-30
  Administered 2023-12-30: 650 mg via ORAL
  Filled 2023-12-30: qty 2

## 2023-12-30 MED ORDER — DIPHENHYDRAMINE HCL 50 MG/ML IJ SOLN
25.0000 mg | Freq: Once | INTRAMUSCULAR | Status: DC
Start: 2023-12-30 — End: 2023-12-30

## 2023-12-30 MED ORDER — FLUDEOXYGLUCOSE F - 18 (FDG) INJECTION
9.3000 | Freq: Once | INTRAVENOUS | Status: AC | PRN
  Administered 2023-12-30: 10.19 via INTRAVENOUS

## 2023-12-30 MED ORDER — HEPARIN SOD (PORK) LOCK FLUSH 100 UNIT/ML IV SOLN
500.0000 [IU] | Freq: Once | INTRAVENOUS | Status: AC
  Administered 2023-12-30: 500 [IU] via INTRAVENOUS
  Filled 2023-12-30: qty 5

## 2023-12-30 NOTE — Patient Instructions (Signed)
 Blood Transfusion, Adult A blood transfusion is a procedure in which you receive blood or a type of blood cell (blood component) through an IV. You may need a blood transfusion when you have a low blood count, which is a low number of any blood cell. This may result from a bleeding disorder, illness, injury, or surgery. The blood may come from a donor, or you may be able to have your own blood collected and stored (autologous blood donation) before a planned surgery. The blood given in a transfusion may be made up of different blood components. You may receive: Red blood cells. These carry oxygen to the cells in the body. Platelets. These help your blood to clot. Plasma. This is the liquid part of your blood. It carries proteins and other substances throughout the body. White blood cells. These help you fight infections. If you have hemophilia or another clotting disorder, you may also receive other types of blood products. Depending on the type of blood product, this procedure may take 1-4 hours to complete. Tell a health care provider about: Any bleeding problems you have. Any previous reactions you have had during a blood transfusion. Any allergies you have. All medicines you are taking, including vitamins, herbs, eye drops, creams, and over-the-counter medicines. Any surgeries you have had. Any medical conditions you have. Whether you are pregnant or may be pregnant. What are the risks? Talk with your health care provider about risks. The most common problems include: A mild allergic reaction, such as red, swollen areas of skin (hives) and itching. Fever or chills. This may be the body's response to new blood cells received. This may occur during or up to 4 hours after the transfusion. More serious problems may include: A serious allergic reaction that causes difficulty breathing or swelling around the face and lips. Transfusion-associated circulatory overload (TACO), or too much fluid in  the lungs. This may cause breathing problems. Transfusion-related acute lung injury (TRALI), which causes breathing difficulty and low oxygen in the blood. This can occur within hours of the transfusion or several days later. Iron overload. This can happen after receiving many blood transfusions over a period of time. Infection or virus being transmitted. This is rare because donated blood is carefully tested before it is given. Hemolytic transfusion reaction. This is rare. It happens when the body's defense system (immune system)tries to attack the new blood cells. Symptoms may include fever, chills, nausea, low blood pressure, and low back or chest pain. Transfusion-associated graft-versus-host disease (TAGVHD). This is rare. It happens when donated cells attack the body's healthy tissues. What happens before the procedure? You will have a blood test to check your blood type. This test is done to know what kind of blood your body will accept and to match it to the donor blood. If you are going to have a planned surgery, you may be able to do an autologous blood donation. This may be done in case you need to have a transfusion. You will have your temperature, blood pressure, and pulse checked before the transfusion. If you have had an allergic reaction to a transfusion in the past, you may be given medicine to help prevent a reaction. This medicine may be given to you by mouth (orally) or through an IV. What happens during the procedure?  An IV will be inserted into one of your veins. The bag of blood will be attached to your IV. The blood will then enter through your vein. Your temperature, blood pressure,  and pulse will be monitored during the transfusion. This monitoring is done to detect early signs of a transfusion reaction. Tell your nurse right away if you have any of these symptoms during the transfusion: Shortness of breath or trouble breathing. Chest or back pain. Fever or  chills. Itching or hives. If you have any signs or symptoms of a reaction, your transfusion will be stopped and you may be given medicine. When the transfusion is complete, your IV will be removed. Pressure may be applied to the IV site for a few minutes. A bandage (dressing)will be applied. The procedure may vary among health care providers and hospitals. What happens after the procedure? Your temperature, blood pressure, pulse, breathing rate, and blood oxygen level will be monitored until you leave the hospital or clinic. Your blood may be tested to see how you have responded to the transfusion. You may be warmed with fluids or blankets to maintain a normal body temperature. If you receive your blood transfusion in an outpatient setting, you will be told whom to contact to report any reactions. Where to find more information Visit the [employer]: redcross.org Summary A blood transfusion is a procedure in which you receive blood or a type of blood cell (blood component) through an IV. The blood given in a transfusion may be made up of different blood components. You may receive red blood cells, platelets, plasma, or white blood cells depending on the condition treated. Your temperature, blood pressure, and pulse will be monitored before, during, and after the transfusion. After the transfusion, your blood may be tested to see how your body has responded. This information is not intended to replace advice given to you by your health care provider. Make sure you discuss any questions you have with your health care provider. Document Revised: 01/19/2022 Document Reviewed: 01/19/2022 Elsevier Patient Education  2024 [employer].

## 2023-12-31 LAB — TYPE AND SCREEN
ABO/RH(D): O POS
Antibody Screen: NEGATIVE
Unit division: 0

## 2023-12-31 LAB — BPAM RBC
Blood Product Expiration Date: 202503142359
ISSUE DATE / TIME: 202502240934
Unit Type and Rh: 5100

## 2024-01-01 ENCOUNTER — Ambulatory Visit
Admission: RE | Admit: 2024-01-01 | Discharge: 2024-01-01 | Disposition: A | Discharge status: Home / Self Care | Payer: Medicare Other | Source: Ambulatory Visit | Attending: Radiation Oncology | Admitting: Radiation Oncology

## 2024-01-01 ENCOUNTER — Ambulatory Visit: Payer: Self-pay | Admitting: Urology

## 2024-01-01 VITALS — BP 104/57 | HR 54 | Temp 97.4°F | Resp 16 | Wt 180.0 lb

## 2024-01-01 DIAGNOSIS — C184 Malignant neoplasm of transverse colon: Secondary | ICD-10-CM | POA: Diagnosis present

## 2024-01-01 NOTE — Progress Notes (Signed)
 Radiation Oncology Follow up Note old patient new area abdominal lymph node involvement  Name: Chris George   Date:   01/01/2024 MRN:  [id number] DOB: 1955-12-06    This 68 y.o. male presents to the clinic today for reevaluation of patient previously treated 7 months prior with IMRT radiation therapy for Gleason 7 adenocarcinoma the prostate presenting with a PSA of 40 in a patient with known locally Vance adenocarcinoma the rectum.  Patient also has a history of smoldering myeloma.Marland Kitchen  REFERRING PROVIDER: Marina Goodell, MD  HPI: Patient is a 68 year old male now out over 7 months having completed IMRT radiation therapy for Gleason 7 adenocarcinoma the prostate.  Patient also has known smoldering myeloma as well as locally advanced adenocarcinoma of the rectum.Marland Kitchen  He was recently admitted to [facility] with CHF exacerbation in the hospital for nearly a month.  He is seen today doing fairly well seems to be getting some of his strength back.  He was noted to have on CT scan back in early February enlarging aortocaval nodes measuring 1.9 x 1.4 cm.  He has been followed for progressive retroperitoneal lymphadenopathy presumably relating to his colon cancer.  He did receive chemotherapy of FOLFIRI in December 2024.  His CEA has increased significantly over this time up to 327.  He had a PET restaging PET CT scan which showed only enlarging and progressive hypermetabolic retrocaval mass consistent with progressive metastatic disease again presumed to be colon cancer.  No other evidence of metastatic disease was noted.  His last PSA was 4.6 and is still being followed by medical oncology.  Patient also has his comorbidities anemia neutropenia thrombocytopenia renal insufficiency and peripheral neuropathy.  He is having no abdominal pain or discomfort at this time  COMPLICATIONS OF TREATMENT: none  FOLLOW UP COMPLIANCE: keeps appointments   PHYSICAL EXAM:  BP (!) 104/57   Pulse (!) 54   Temp (!)  97.4 F (36.3 C) (Temporal)   Resp 16   Wt 180 lb (81.6 kg)   BMI 26.58 kg/m  Frail-appearing male in NAD.  Well-developed well-nourished patient in NAD. HEENT reveals PERLA, EOMI, discs not visualized.  Oral cavity is clear. No oral mucosal lesions are identified. Neck is clear without evidence of cervical or supraclavicular adenopathy. Lungs are clear to A&P. Cardiac examination is essentially unremarkable with regular rate and rhythm without murmur rub or thrill. Abdomen is benign with no organomegaly or masses noted. Motor sensory and DTR levels are equal and symmetric in the upper and lower extremities. Cranial nerves II through XII are grossly intact. Proprioception is intact. No peripheral adenopathy or edema is identified. No motor or sensory levels are noted. Crude visual fields are within normal range.  RADIOLOGY RESULTS: CT scans PET CT scans all reviewed compatible with above-stated findings.  PLAN: At this time elect go ahead with SBRT to the areas of retrocaval nodal mass which is the only area of active disease at this time.  Again I agree this is most likely colon cancer.  I would plan on delivering 30 Gray in 5 fractions using SBRT.  Risks and benefits of treatment occluding possible GI upset diarrhea fatigue alteration blood counts all were discussed in detail with the patient.  Patient comprehends my recommendations well.  I have personally set up for CT simulation next week.  I would like to take this opportunity to thank you for allowing me to participate in the care of your patient.Carmina Miller, MD

## 2024-01-03 ENCOUNTER — Inpatient Hospital Stay: Payer: Medicare Other

## 2024-01-03 ENCOUNTER — Inpatient Hospital Stay: Payer: Medicare Other | Admitting: Oncology

## 2024-01-03 VITALS — BP 107/61 | HR 56 | Temp 97.0°F | Resp 18 | Wt 181.2 lb

## 2024-01-03 DIAGNOSIS — C61 Malignant neoplasm of prostate: Secondary | ICD-10-CM

## 2024-01-03 DIAGNOSIS — C184 Malignant neoplasm of transverse colon: Secondary | ICD-10-CM | POA: Diagnosis not present

## 2024-01-03 DIAGNOSIS — C189 Malignant neoplasm of colon, unspecified: Secondary | ICD-10-CM | POA: Diagnosis not present

## 2024-01-03 LAB — CBC WITH DIFFERENTIAL (CANCER CENTER ONLY)
Abs Immature Granulocytes: 0.01 10*3/uL (ref 0.00–0.07)
Basophils Absolute: 0 10*3/uL (ref 0.0–0.1)
Basophils Relative: 1 %
Eosinophils Absolute: 0.1 10*3/uL (ref 0.0–0.5)
Eosinophils Relative: 4 %
HCT: 23.2 % — ABNORMAL LOW (ref 39.0–52.0)
Hemoglobin: 7.4 g/dL — ABNORMAL LOW (ref 13.0–17.0)
Immature Granulocytes: 0 %
Lymphocytes Relative: 10 %
Lymphs Abs: 0.4 10*3/uL — ABNORMAL LOW (ref 0.7–4.0)
MCH: 29 pg (ref 26.0–34.0)
MCHC: 31.9 g/dL (ref 30.0–36.0)
MCV: 91 fL (ref 80.0–100.0)
Monocytes Absolute: 0.4 10*3/uL (ref 0.1–1.0)
Monocytes Relative: 11 %
Neutro Abs: 2.7 10*3/uL (ref 1.7–7.7)
Neutrophils Relative %: 74 %
Platelet Count: 129 10*3/uL — ABNORMAL LOW (ref 150–400)
RBC: 2.55 MIL/uL — ABNORMAL LOW (ref 4.22–5.81)
RDW: 17.9 % — ABNORMAL HIGH (ref 11.5–15.5)
WBC Count: 3.6 10*3/uL — ABNORMAL LOW (ref 4.0–10.5)
nRBC: 0 % (ref 0.0–0.2)

## 2024-01-03 LAB — MAGNESIUM: Magnesium: 1.9 mg/dL (ref 1.7–2.4)

## 2024-01-03 LAB — CMP (CANCER CENTER ONLY)
ALT: 21 U/L (ref 0–44)
AST: 27 U/L (ref 15–41)
Albumin: 3.8 g/dL (ref 3.5–5.0)
Alkaline Phosphatase: 234 U/L — ABNORMAL HIGH (ref 38–126)
Anion gap: 10 (ref 5–15)
BUN: 96 mg/dL — ABNORMAL HIGH (ref 8–23)
CO2: 25 mmol/L (ref 22–32)
Calcium: 9.5 mg/dL (ref 8.9–10.3)
Chloride: 97 mmol/L — ABNORMAL LOW (ref 98–111)
Creatinine: 2.91 mg/dL — ABNORMAL HIGH (ref 0.61–1.24)
GFR, Estimated: 23 mL/min — ABNORMAL LOW (ref >60)
Glucose, Bld: 142 mg/dL — ABNORMAL HIGH (ref 70–99)
Potassium: 4.2 mmol/L (ref 3.5–5.1)
Sodium: 132 mmol/L — ABNORMAL LOW (ref 135–145)
Total Bilirubin: 1.1 mg/dL (ref 0.0–1.2)
Total Protein: 8.6 g/dL — ABNORMAL HIGH (ref 6.5–8.1)

## 2024-01-03 LAB — SAMPLE TO BLOOD BANK

## 2024-01-03 NOTE — Progress Notes (Signed)
 [facility]  [phone] [phone]  ID: PRATT BRESS OB: February 12, 1956  MR#: [id number]  AOZ#:[id number]  Patient Care Team: Marina Goodell, MD as PCP - General (Family Medicine) [name], MD as Consulting Physician (Oncology) Morene Crocker, MD as Referring Physician (Neurology) Mady Haagensen, MD as Consulting Physician (Nephrology) Lovenia Shuck, MD as Referring Physician (Cardiology)  CHIEF COMPLAINT: Recurrent colon cancer, smoldering myeloma, prostate cancer.  INTERVAL HISTORY: Patient returns to clinic today for further evaluation and discussion of his PET scan results.  He continues to improve and feels nearly back to his baseline.  He has a mild expressive aphasia and a chronic peripheral neuropathy, but no other neurologic complaints.  He denies any recent fevers.  He has a good appetite.  He has no chest pain, cough, or hemoptysis.  He denies any nausea, vomiting, constipation, or diarrhea.  He has no melena or hematochezia.  He has no urinary complaints.  Patient offers no further specific complaints today.  REVIEW OF SYSTEMS:   Review of Systems  Constitutional: Negative.  Negative for fever, malaise/fatigue and weight loss.  Respiratory: Negative.  Negative for cough, hemoptysis and shortness of breath.   Cardiovascular: Negative.  Negative for chest pain and leg swelling.  Gastrointestinal: Negative.  Negative for blood in stool, diarrhea and melena.  Genitourinary: Negative.  Negative for hematuria.  Musculoskeletal: Negative.  Negative for back pain and joint pain.  Skin: Negative.  Negative for rash.  Neurological:  Positive for tingling and sensory change. Negative for dizziness, focal weakness, weakness and headaches.  Psychiatric/Behavioral: Negative.  Negative for depression. The patient is not nervous/anxious.     As per HPI. Otherwise, a complete review of systems is negative.  PAST MEDICAL HISTORY: Past  Medical History:  Diagnosis Date   A-fib (HCC)    Anemia    Arthritis    ankles   Cancer (HCC)    multiple myeloma   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HCC) 10/26/2019   Cardiomyopathy (HCC)    Chemotherapy-induced peripheral neuropathy (HCC)    CHF (congestive heart failure) (HCC)    Depression    Dysrhythmia    atrial fib   History of CVA (cerebrovascular accident)    HLD (hyperlipidemia)    Hypercholesteremia    Hypertension    Moderate tricuspid insufficiency    Multiple myeloma (HCC)    not being treated right now per pt 11/11/19   Pneumonia    Sleep apnea    No CPAP.  OSA resolved with wt loss.    PAST SURGICAL HISTORY: Past Surgical History:  Procedure Laterality Date   ATRIAL ABLATION SURGERY     CARDIAC SURGERY     A-Fib Ablations   COLONOSCOPY WITH PROPOFOL N/A 10/26/2019   Procedure: COLONOSCOPY WITH BIOPSY;  Surgeon: [name], MD;  Location: [facility];  Service: Endoscopy;  Laterality: N/A;   COLONOSCOPY WITH PROPOFOL N/A 09/03/2022   Procedure: COLONOSCOPY WITH PROPOFOL;  Surgeon: [name], MD;  Location: [facility];  Service: Endoscopy;  Laterality: N/A;   ESOPHAGOGASTRODUODENOSCOPY (EGD) WITH PROPOFOL N/A 10/26/2019   Procedure: ESOPHAGOGASTRODUODENOSCOPY (EGD) WITH BIOPSY;  Surgeon: [name], MD;  Location: [facility];  Service: Endoscopy;  Laterality: N/A;  sleep apnea   LAPAROSCOPIC PARTIAL COLECTOMY Right 11/17/2019   Procedure: LAPAROSCOPIC PARTIAL COLECTOMY RIGHT EXTENDED;  Surgeon: [name], MD;  Location: [facility];  Service: General;  Laterality: Right;   PORTACATH PLACEMENT N/A 12/28/2019   Procedure: INSERTION PORT-A-CATH  Left subclavian;  Surgeon: [name], MD;  Location: [facility];  Service: General;  Laterality: N/A;    FAMILY HISTORY: Family History  Problem Relation Age of Onset   Healthy Mother     ADVANCED DIRECTIVES (Y/N):  N  HEALTH MAINTENANCE: Social History   Tobacco Use   Smoking  status: Never    Passive exposure: Never   Smokeless tobacco: Never  Vaping Use   Vaping status: Never Used  Substance Use Topics   Alcohol use: Not Currently   Drug use: Never     Colonoscopy:  PAP:  Bone density:  Lipid panel:  No Known Allergies  Current Outpatient Medications  Medication Sig Dispense Refill   allopurinol (ZYLOPRIM) 100 MG tablet Take 200 mg by mouth daily.     apixaban (ELIQUIS) 5 MG TABS tablet Take 5 mg by mouth 2 (two) times daily.     atorvastatin (LIPITOR) 80 MG tablet Take 80 mg by mouth daily.     bumetanide (BUMEX) 2 MG tablet Take 2 mg by mouth 2 (two) times daily.     colchicine 0.6 MG tablet Take 1 tablet (0.6 mg total) by mouth 2 (two) times daily. Until flare resolves. 20 tablet 1   lidocaine-prilocaine (EMLA) cream Apply 1 Application topically daily.     magnesium chloride (SLOW-MAG) 64 MG TBEC SR tablet Take 1 tablet (64 mg total) by mouth daily. 60 tablet 1   spironolactone (ALDACTONE) 25 MG tablet Take 1 tablet by mouth every morning.     tamsulosin (FLOMAX) 0.4 MG CAPS capsule Take 1 capsule (0.4 mg total) by mouth daily after supper. 30 capsule 6   APPLE CIDER VINEGAR PO Take 30-45 mLs by mouth every other day.  (Patient not taking: Reported on 01/01/2024)     HYDROcodone-acetaminophen (NORCO) 5-325 MG tablet Take 1 tablet by mouth every 6 (six) hours as needed for moderate pain. (Patient not taking: Reported on 12/27/2023) 30 tablet 0   loperamide (IMODIUM A-D) 2 MG tablet Take 2 mg by mouth 4 (four) times daily as needed for diarrhea or loose stools. (Patient not taking: Reported on 01/01/2024)     MAGNESIUM PO Take by mouth. (Patient not taking: Reported on 01/03/2024)     Misc Natural Products (TART CHERRY ADVANCED PO) Take 60 mLs by mouth every other day. (Patient not taking: Reported on 12/27/2023)     Multiple Vitamins-Minerals (ZINC PO) Take by mouth. (Patient not taking: Reported on 12/27/2023)     nifedipine 0.3 % ointment Place 1  Application rectally 4 (four) times daily. with Lidocaine 1.5% (Patient not taking: Reported on 12/27/2023) 30 g 0   prochlorperazine (COMPAZINE) 10 MG tablet Take 1 tablet (10 mg total) by mouth every 6 (six) hours as needed for nausea or vomiting. (Patient not taking: Reported on 12/27/2023) 60 tablet 1   tadalafil (CIALIS) 10 MG tablet Take 1-2 tablets (10-20 mg total) by mouth daily as needed for erectile dysfunction (take 45 minutes prior to secual activity). (Patient not taking: Reported on 12/27/2023) 30 tablet 6   traMADol (ULTRAM) 50 MG tablet Take 0.5-1 tablets (25-50 mg total) by mouth every 6 (six) hours as needed for severe pain (pain score 7-10). (Patient not taking: Reported on 12/27/2023) 30 tablet 0   Zinc Sulfate (ZINC 15 PO) Take by mouth. (Patient not taking: Reported on 12/27/2023)     No current facility-administered medications for this visit.   Facility-Administered Medications Ordered in Other Visits  Medication Dose Route Frequency Provider Last  Rate Last Admin   heparin lock flush 100 unit/mL  500 Units Intravenous Once [name], MD        OBJECTIVE: Vitals:   01/03/24 0849 01/03/24 0853  BP:  107/61  Pulse:  (!) 56  Resp: 18 18  Temp:  (!) 97 F (36.1 C)  SpO2:  100%       Body mass index is 26.76 kg/m.    ECOG FS:1 - Symptomatic but completely ambulatory  General: Well-developed, well-nourished, no acute distress. Eyes: Pink conjunctiva, anicteric sclera. HEENT: Normocephalic, moist mucous membranes. Lungs: No audible wheezing or coughing. Heart: Regular rate and rhythm. Abdomen: Soft, nontender, no obvious distention. Musculoskeletal: No edema, cyanosis, or clubbing. Neuro: Alert, answering all questions appropriately. Cranial nerves grossly intact. Skin: No rashes or petechiae noted. Psych: Normal affect.   LAB RESULTS:  Lab Results  Component Value Date   NA 132 (L) 12/27/2023   K 4.6 12/27/2023   CL 97 (L) 12/27/2023   CO2 25  12/27/2023   GLUCOSE 101 (H) 12/27/2023   BUN 86 (H) 12/27/2023   CREATININE 2.74 (H) 12/27/2023   CALCIUM 9.6 12/27/2023   PROT 8.5 (H) 12/27/2023   ALBUMIN 3.0 (L) 12/27/2023   AST 33 12/27/2023   ALT 29 12/27/2023   ALKPHOS 263 (H) 12/27/2023   BILITOT 1.1 12/27/2023   GFRNONAA 25 (L) 12/27/2023   GFRAA >60 07/29/2020    Lab Results  Component Value Date   WBC 3.6 (L) 01/03/2024   NEUTROABS 2.7 01/03/2024   HGB 7.4 (L) 01/03/2024   HCT 23.2 (L) 01/03/2024   MCV 91.0 01/03/2024   PLT 129 (L) 01/03/2024   Lab Results  Component Value Date   IRON 79 08/13/2023   TIBC 234 (L) 08/13/2023   IRONPCTSAT 34 08/13/2023   Lab Results  Component Value Date   FERRITIN 686 (H) 09/03/2023     STUDIES: NM PET Image Restag (PS) Skull Base To Thigh Result Date: 12/30/2023 CLINICAL DATA:  Subsequent treatment strategy for colon cancer. History of prostate cancer and lymphoma. EXAM: NUCLEAR MEDICINE PET SKULL BASE TO THIGH TECHNIQUE: 10.19 mCi F-18 FDG was injected intravenously. Full-ring PET imaging was performed from the skull base to thigh after the radiotracer. CT data was obtained and used for attenuation correction and anatomic localization. Fasting blood glucose: 83 mg/dl COMPARISON:  PET-CT 40/98/1191 and 03/12/2023. CT of the chest, abdomen and pelvis 12/07/2022. Scrotal ultrasound 11/15/2023 FINDINGS: Mediastinal blood pool activity: SUV max 2.4 Liver activity: SUV max 3.1 NECK: No hypermetabolic cervical lymph nodes are identified. No suspicious activity identified within the pharyngeal mucosal space. Incidental CT findings: Bilateral carotid atherosclerosis. CHEST: There are no hypermetabolic mediastinal, hilar or axillary lymph nodes. No hypermetabolic pulmonary activity or suspicious nodularity. Incidental CT findings: Left subclavian Port-A-Cath extends to the superior cavoatrial junction. Stable cardiomegaly and central enlargement of the pulmonary arteries. Stable chronic  linear scarring at the left lung base and asymmetric gynecomastia the left. ABDOMEN/PELVIS: There is no hypermetabolic activity within the liver, adrenal glands, spleen or pancreas. Enlarging and progressively hypermetabolic retrocaval mass which measures approximately 3.5 x 1.8 cm on image 104/6. This has an SUV max of 8.4 (previously 5.8). No other hypermetabolic lymph nodes identified in the abdomen or pelvis. Incidental CT findings: New large heterogeneous mass within the left scrotum with peripheral hypermetabolic activity. This is new compared with the prior PET-CT and was evaluated by scrotal ultrasound approximately 6 weeks ago. This appears enlarged from the ultrasound, measuring approximately 10.5  x 8.6 cm on image 175/6. Stable mild enlargement of the prostate gland without hypermetabolic activity. Stable postsurgical changes from previous right hemicolectomy. SKELETON: There is no hypermetabolic activity to suggest osseous metastatic disease. The diffuse marrow hypermetabolic activity seen on the most recent PET-CT has resolved, consistent with treatment changes. Focally decreased metabolic activity within the L2 and L3 vertebral bodies, likely related to prior radiation therapy. Incidental CT findings: none IMPRESSION: 1. Enlarging and progressively hypermetabolic retrocaval mass, consistent with progressive metastatic disease. 2. No other evidence of metastatic disease. The diffuse marrow hypermetabolic activity seen on the most recent PET-CT has resolved, consistent with treatment changes. 3. New large heterogeneous mass within the left scrotum with peripheral hypermetabolic activity, enlarged from recent scrotal ultrasound. This is nonspecific, but presumably represents a complex hydrocele based on prior ultrasound. Correlate clinically. 4. Aortic atherosclerosis. Electronically Signed   By: Carey Bullocks M.D.   On: 12/30/2023 16:14     ONCOLOGY HISTORY: Patient completed cycle 9 of adjuvant  FOLFOX on June 03, 2020.  Given his difficulties with treatment and declining performance status, treatment was discontinued altogether. Colonoscopy on September 03, 2022 did not reveal any significant pathology and recommendation was to repeat in 3 years.    ASSESSMENT: Stage IIIc colon cancer, smoldering myeloma, prostate cancer.  PLAN:    Stage IIIc colon cancer:  CT scan from December 07, 2022 reviewed independently with enlarging aortocaval node now measuring 1.9 x 1.4 cm.  PET scan results from Mar 15, 2023 reviewed independently with progressive retroperitoneal lymphadenopathy presumably related to patient's colon cancer.  Patient last received chemotherapy with FOLFIRI on October 15, 2023.  CEA has increased significantly and his most recent result is 396.  PET scan results from December 30, 2023 reviewed independently and report as above with progressive disease of hypermetabolic retrocaval mass, but no other evidence of metastatic disease.  Plan is to do SBRT to this isolated lesion over the next 2 to 3 weeks and hold reinitiating chemotherapy as long as possible.  Return to clinic at the end of radiation for further evaluation. Prostate cancer: Gleason's 3+4.  Patient's PSA increased and peaked at 39.34, but since completing treatment has decreased and his most recent result was 4.65. Nuclear medicine bone scan on December 13, 2022 was reported as negative.  PSMA PET per radiation oncology from Mar 21, 2023 did not reveal metastatic disease.  Patient completed XRT for local control disease on May 30, 2023.  Repeat imaging as above.  Can consider Eligard in the future.  Continue follow-up with urology as indicated. Smoldering myeloma: Chronic and unchanged.  Patient was initially diagnosed and treated in [place], [place] and treated with single agent Velcade in approximately 2013.  He declined maintenance treatment or referral for bone marrow transplant.  His M spike has ranged from 1.1-2.0 since  July 2020.  His most recent result was 1.6.  Over the same timeframe his IgG component has ranged from [phone].  His most recent result is 2344.  Finally, his kappa free light chains have ranged from 24.8 -75.3 with his most recent result reported at 36.2.  Nuclear med bone scan as above.  His most recent bone marrow biopsy completed on May 28, 2019 revealed only 10 to 15% plasma cells with no clonality reported.  Cytogenetics were also reported as normal.  Continue to monitor closely.  Anemia: Hemoglobin stable at 7.4.  Patient does not require transfusion.  Monitor. Neutropenia: Resolved.  Previously, patient required Udenyca with treatments.  Continue with dose reduced FOLFIRI if chemotherapy is reinitiated.   Thrombocytopenia: Chronic and unchanged.  Patient platelet count is 129. Renal insufficiency: Patient's most recent GFR has decreased to 25.  Neither irinotecan or 5-FU need to be dose reduced in the setting of renal insufficiency.  Continue follow-up with nephrology as indicated.   Hypomagnesia: Resolved. Peripheral neuropathy: Chronic and unchanged.  Patient reports gabapentin did not help.  Continue Lyrica 50 mg daily and was previously given a referral to neurology.   Hypotension: Resolved. Coping/anxiety: Patient was previously given a referral to [facility].  Patient reports he talks to them approximately once per week. CHF exacerbation/edema: Resolved.  Patient was recently at [facility] for nearly a month.  Continue follow-up with cardiology and nephrology. Swollen testicles: Ultrasound results noted.  Follow-up with urology as scheduled.  I spent a total of 30 minutes reviewing chart data, face-to-face evaluation with the patient, counseling and coordination of care as detailed above.   Patient expressed understanding and was in agreement with this plan. He also understands that He can call clinic at any time with any questions, concerns, or complaints.    [name], MD   01/03/2024 9:25 AM

## 2024-01-04 LAB — CEA: CEA: 333 ng/mL — ABNORMAL HIGH (ref 0.0–4.7)

## 2024-01-04 LAB — PSA: Prostatic Specific Antigen: 1.89 ng/mL (ref 0.00–4.00)

## 2024-01-06 ENCOUNTER — Ambulatory Visit
Admission: RE | Admit: 2024-01-06 | Discharge: 2024-01-06 | Disposition: A | Discharge status: Home / Self Care | Payer: Medicare Other | Source: Ambulatory Visit | Attending: Radiation Oncology | Admitting: Radiation Oncology

## 2024-01-06 DIAGNOSIS — C184 Malignant neoplasm of transverse colon: Secondary | ICD-10-CM | POA: Insufficient documentation

## 2024-01-06 DIAGNOSIS — C786 Secondary malignant neoplasm of retroperitoneum and peritoneum: Secondary | ICD-10-CM | POA: Insufficient documentation

## 2024-01-06 DIAGNOSIS — Z510 Encounter for antineoplastic radiation therapy: Secondary | ICD-10-CM | POA: Insufficient documentation

## 2024-01-06 DIAGNOSIS — Z8546 Personal history of malignant neoplasm of prostate: Secondary | ICD-10-CM | POA: Insufficient documentation

## 2024-01-06 DIAGNOSIS — Z923 Personal history of irradiation: Secondary | ICD-10-CM | POA: Insufficient documentation

## 2024-01-09 DIAGNOSIS — Z510 Encounter for antineoplastic radiation therapy: Secondary | ICD-10-CM | POA: Diagnosis not present

## 2024-01-14 ENCOUNTER — Ambulatory Visit
Admission: RE | Admit: 2024-01-14 | Discharge: 2024-01-14 | Disposition: A | Discharge status: Home / Self Care | Payer: Medicare Other | Source: Ambulatory Visit | Attending: Radiation Oncology | Admitting: Radiation Oncology

## 2024-01-14 ENCOUNTER — Other Ambulatory Visit: Payer: Self-pay

## 2024-01-14 DIAGNOSIS — Z510 Encounter for antineoplastic radiation therapy: Secondary | ICD-10-CM | POA: Diagnosis not present

## 2024-01-14 LAB — RAD ONC ARIA SESSION SUMMARY
Course Elapsed Days: 0
Plan Fractions Treated to Date: 1
Plan Prescribed Dose Per Fraction: 6 Gy
Plan Total Fractions Prescribed: 5
Plan Total Prescribed Dose: 30 Gy
Reference Point Dosage Given to Date: 6 Gy
Reference Point Session Dosage Given: 6 Gy
Session Number: 1

## 2024-01-16 ENCOUNTER — Other Ambulatory Visit: Payer: Self-pay

## 2024-01-16 ENCOUNTER — Ambulatory Visit
Admission: RE | Admit: 2024-01-16 | Discharge: 2024-01-16 | Disposition: A | Discharge status: Home / Self Care | Payer: Medicare Other | Source: Ambulatory Visit | Attending: Radiation Oncology | Admitting: Radiation Oncology

## 2024-01-16 DIAGNOSIS — Z510 Encounter for antineoplastic radiation therapy: Secondary | ICD-10-CM | POA: Diagnosis not present

## 2024-01-16 LAB — RAD ONC ARIA SESSION SUMMARY
Course Elapsed Days: 2
Plan Fractions Treated to Date: 2
Plan Prescribed Dose Per Fraction: 6 Gy
Plan Total Fractions Prescribed: 5
Plan Total Prescribed Dose: 30 Gy
Reference Point Dosage Given to Date: 12 Gy
Reference Point Session Dosage Given: 6 Gy
Session Number: 2

## 2024-01-20 ENCOUNTER — Other Ambulatory Visit: Payer: Self-pay

## 2024-01-20 ENCOUNTER — Ambulatory Visit
Admission: RE | Admit: 2024-01-20 | Discharge: 2024-01-20 | Disposition: A | Discharge status: Home / Self Care | Payer: Medicare Other | Source: Ambulatory Visit | Attending: Radiation Oncology | Admitting: Radiation Oncology

## 2024-01-20 DIAGNOSIS — Z510 Encounter for antineoplastic radiation therapy: Secondary | ICD-10-CM | POA: Diagnosis not present

## 2024-01-20 LAB — RAD ONC ARIA SESSION SUMMARY
Course Elapsed Days: 6
Plan Fractions Treated to Date: 3
Plan Prescribed Dose Per Fraction: 6 Gy
Plan Total Fractions Prescribed: 5
Plan Total Prescribed Dose: 30 Gy
Reference Point Dosage Given to Date: 18 Gy
Reference Point Session Dosage Given: 6 Gy
Session Number: 3

## 2024-01-22 ENCOUNTER — Ambulatory Visit
Admission: RE | Admit: 2024-01-22 | Discharge: 2024-01-22 | Disposition: A | Discharge status: Home / Self Care | Payer: Medicare Other | Source: Ambulatory Visit | Attending: Radiation Oncology | Admitting: Radiation Oncology

## 2024-01-22 ENCOUNTER — Other Ambulatory Visit: Payer: Self-pay

## 2024-01-22 DIAGNOSIS — Z510 Encounter for antineoplastic radiation therapy: Secondary | ICD-10-CM | POA: Diagnosis not present

## 2024-01-22 LAB — RAD ONC ARIA SESSION SUMMARY
Course Elapsed Days: 8
Plan Fractions Treated to Date: 4
Plan Prescribed Dose Per Fraction: 6 Gy
Plan Total Fractions Prescribed: 5
Plan Total Prescribed Dose: 30 Gy
Reference Point Dosage Given to Date: 24 Gy
Reference Point Session Dosage Given: 6 Gy
Session Number: 4

## 2024-01-24 ENCOUNTER — Other Ambulatory Visit: Payer: Self-pay | Admitting: *Deleted

## 2024-01-24 DIAGNOSIS — C184 Malignant neoplasm of transverse colon: Secondary | ICD-10-CM

## 2024-01-26 ENCOUNTER — Encounter: Payer: Self-pay | Admitting: Oncology

## 2024-01-27 ENCOUNTER — Inpatient Hospital Stay: Payer: Medicare Other

## 2024-01-27 ENCOUNTER — Other Ambulatory Visit: Payer: Self-pay

## 2024-01-27 ENCOUNTER — Inpatient Hospital Stay: Payer: Medicare Other | Admitting: Oncology

## 2024-01-27 ENCOUNTER — Ambulatory Visit
Admission: RE | Admit: 2024-01-27 | Discharge: 2024-01-27 | Disposition: A | Discharge status: Home / Self Care | Payer: Medicare Other | Source: Ambulatory Visit | Attending: Radiation Oncology | Admitting: Radiation Oncology

## 2024-01-27 DIAGNOSIS — Z510 Encounter for antineoplastic radiation therapy: Secondary | ICD-10-CM | POA: Diagnosis not present

## 2024-01-27 LAB — RAD ONC ARIA SESSION SUMMARY
Course Elapsed Days: 13
Plan Fractions Treated to Date: 5
Plan Prescribed Dose Per Fraction: 6 Gy
Plan Total Fractions Prescribed: 5
Plan Total Prescribed Dose: 30 Gy
Reference Point Dosage Given to Date: 30 Gy
Reference Point Session Dosage Given: 6 Gy
Session Number: 5

## 2024-01-28 NOTE — Radiation Completion Notes (Signed)
 Patient Name: Chris George, Chris George MRN: [id number] Date of Birth: 18-Aug-1956 Referring Physician: Maudie Flakes, M.D. Date of Service: 2024-01-28 Radiation Oncologist: Carmina Miller, M.D. [facility]                             RADIATION ONCOLOGY END OF TREATMENT NOTE     Diagnosis: C18.4 Malignant neoplasm of transverse colon Staging on 2019-12-01: 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HCC) T=cT3, N=cN2b, M=cM0 Intent: Curative     HPI: Patient is a 68 year old male now out over 7 months having completed IMRT radiation therapy for Gleason 7 adenocarcinoma the prostate.  Patient also has known smoldering myeloma as well as locally advanced adenocarcinoma of the rectum.Marland Kitchen  He was recently admitted to [facility] with CHF exacerbation in the hospital for nearly a month.  He is seen today doing fairly well seems to be getting some of his strength back.  He was noted to have on CT scan back in early February enlarging aortocaval nodes measuring 1.9 x 1.4 cm.  He has been followed for progressive retroperitoneal lymphadenopathy presumably relating to his colon cancer.  He did receive chemotherapy of FOLFIRI in December 2024.  His CEA has increased significantly over this time up to 327.  He had a PET restaging PET CT scan which showed only enlarging and progressive hypermetabolic retrocaval mass consistent with progressive metastatic disease again presumed to be colon cancer.  No other evidence of metastatic disease was noted.  His last PSA was 4.6 and is still being followed by medical oncology.  Patient also has his comorbidities anemia neutropenia thrombocytopenia renal insufficiency and peripheral neuropathy.  He is having no abdominal pain or discomfort at this time      ==========DELIVERED PLANS==========  First Treatment Date: 2024-01-14 Last Treatment Date: 2024-01-27   Plan Name: [id number] Site: Abdomen Technique: SBRT/SRT-IMRT Mode: Photon Dose Per Fraction: 6  Gy Prescribed Dose (Delivered / Prescribed): 30 Gy / 30 Gy Prescribed Fxs (Delivered / Prescribed): 5 / 5     ==========ON TREATMENT VISIT DATES========== 2024-01-14, 2024-01-16, 2024-01-20, 2024-01-20, 2024-01-22, 2024-01-27     ==========UPCOMING VISITS========== 04/15/2024 [facility] RAD ONCOLOGY FOLLOW UP 30 Carmina Miller, MD  04/08/2024 [facility] ONC LAB [facility]  02/26/2024 [facility] RAD ONCOLOGY FOLLOW UP 30 Carmina Miller, MD  02/04/2024 [facility] OFFICE VISIT Sondra Come, MD  01/29/2024 [facility] ONC EST PT 15 [name], MD  01/29/2024 [facility]        ==========APPENDIX - ON TREATMENT VISIT NOTES==========   See weekly On Treatment Notes in Epic for details in the Media tab (listed as Progress notes on the On Treatment Visit Dates listed above).

## 2024-01-29 ENCOUNTER — Encounter: Payer: Self-pay | Admitting: Oncology

## 2024-01-29 ENCOUNTER — Inpatient Hospital Stay (HOSPITAL_BASED_OUTPATIENT_CLINIC_OR_DEPARTMENT_OTHER): Admitting: Oncology

## 2024-01-29 ENCOUNTER — Inpatient Hospital Stay: Attending: Oncology

## 2024-01-29 VITALS — BP 95/52 | HR 58 | Temp 96.3°F | Resp 16 | Ht 69.0 in | Wt 185.0 lb

## 2024-01-29 DIAGNOSIS — N289 Disorder of kidney and ureter, unspecified: Secondary | ICD-10-CM | POA: Insufficient documentation

## 2024-01-29 DIAGNOSIS — C61 Malignant neoplasm of prostate: Secondary | ICD-10-CM

## 2024-01-29 DIAGNOSIS — D696 Thrombocytopenia, unspecified: Secondary | ICD-10-CM | POA: Diagnosis not present

## 2024-01-29 DIAGNOSIS — D649 Anemia, unspecified: Secondary | ICD-10-CM | POA: Insufficient documentation

## 2024-01-29 DIAGNOSIS — C189 Malignant neoplasm of colon, unspecified: Secondary | ICD-10-CM | POA: Insufficient documentation

## 2024-01-29 DIAGNOSIS — D509 Iron deficiency anemia, unspecified: Secondary | ICD-10-CM | POA: Diagnosis not present

## 2024-01-29 DIAGNOSIS — F419 Anxiety disorder, unspecified: Secondary | ICD-10-CM | POA: Insufficient documentation

## 2024-01-29 DIAGNOSIS — C184 Malignant neoplasm of transverse colon: Secondary | ICD-10-CM

## 2024-01-29 DIAGNOSIS — G629 Polyneuropathy, unspecified: Secondary | ICD-10-CM | POA: Insufficient documentation

## 2024-01-29 DIAGNOSIS — D472 Monoclonal gammopathy: Secondary | ICD-10-CM | POA: Insufficient documentation

## 2024-01-29 DIAGNOSIS — I959 Hypotension, unspecified: Secondary | ICD-10-CM | POA: Diagnosis not present

## 2024-01-29 DIAGNOSIS — N5089 Other specified disorders of the male genital organs: Secondary | ICD-10-CM | POA: Insufficient documentation

## 2024-01-29 LAB — CBC WITH DIFFERENTIAL/PLATELET
Abs Immature Granulocytes: 0.01 10*3/uL (ref 0.00–0.07)
Basophils Absolute: 0 10*3/uL (ref 0.0–0.1)
Basophils Relative: 0 %
Eosinophils Absolute: 0.1 10*3/uL (ref 0.0–0.5)
Eosinophils Relative: 4 %
HCT: 23.4 % — ABNORMAL LOW (ref 39.0–52.0)
Hemoglobin: 7.3 g/dL — ABNORMAL LOW (ref 13.0–17.0)
Immature Granulocytes: 0 %
Lymphocytes Relative: 10 %
Lymphs Abs: 0.2 10*3/uL — ABNORMAL LOW (ref 0.7–4.0)
MCH: 28.7 pg (ref 26.0–34.0)
MCHC: 31.2 g/dL (ref 30.0–36.0)
MCV: 92.1 fL (ref 80.0–100.0)
Monocytes Absolute: 0.4 10*3/uL (ref 0.1–1.0)
Monocytes Relative: 17 %
Neutro Abs: 1.6 10*3/uL — ABNORMAL LOW (ref 1.7–7.7)
Neutrophils Relative %: 69 %
Platelets: 107 10*3/uL — ABNORMAL LOW (ref 150–400)
RBC: 2.54 MIL/uL — ABNORMAL LOW (ref 4.22–5.81)
RDW: 16.8 % — ABNORMAL HIGH (ref 11.5–15.5)
WBC: 2.3 10*3/uL — ABNORMAL LOW (ref 4.0–10.5)
nRBC: 0 % (ref 0.0–0.2)

## 2024-01-29 LAB — CMP (CANCER CENTER ONLY)
ALT: 14 U/L (ref 0–44)
AST: 20 U/L (ref 15–41)
Albumin: 3.6 g/dL (ref 3.5–5.0)
Alkaline Phosphatase: 229 U/L — ABNORMAL HIGH (ref 38–126)
Anion gap: 10 (ref 5–15)
BUN: 63 mg/dL — ABNORMAL HIGH (ref 8–23)
CO2: 25 mmol/L (ref 22–32)
Calcium: 9.1 mg/dL (ref 8.9–10.3)
Chloride: 101 mmol/L (ref 98–111)
Creatinine: 2.7 mg/dL — ABNORMAL HIGH (ref 0.61–1.24)
GFR, Estimated: 25 mL/min — ABNORMAL LOW (ref >60)
Glucose, Bld: 97 mg/dL (ref 70–99)
Potassium: 4.5 mmol/L (ref 3.5–5.1)
Sodium: 136 mmol/L (ref 135–145)
Total Bilirubin: 0.8 mg/dL (ref 0.0–1.2)
Total Protein: 8.4 g/dL — ABNORMAL HIGH (ref 6.5–8.1)

## 2024-01-29 LAB — MAGNESIUM: Magnesium: 1.9 mg/dL (ref 1.7–2.4)

## 2024-01-29 LAB — SAMPLE TO BLOOD BANK

## 2024-01-29 NOTE — Progress Notes (Signed)
 [facility]  [phone] [phone]  ID: Chris George OB: 01-10-1956  MR#: [id number]  AOZ#:[id number]  Patient Care Team: Marina Goodell, MD as PCP - General (Family Medicine) [name], MD as Consulting Physician (Oncology) Morene Crocker, MD as Referring Physician (Neurology) Mady Haagensen, MD as Consulting Physician (Nephrology) Lovenia Shuck, MD as Referring Physician (Cardiology)  CHIEF COMPLAINT: Recurrent colon cancer, smoldering myeloma, prostate cancer.  INTERVAL HISTORY: Patient returns to clinic today for further evaluation at the conclusion of his XRT.  He continues to have weakness and fatigue, but states this is improving. He otherwise feels well.  He has a mild expressive aphasia and a chronic peripheral neuropathy, but no other neurologic complaints.  He denies any recent fevers.  He has a good appetite.  He has no chest pain, cough, shortness of breath, or hemoptysis.  He denies any nausea, vomiting, constipation, or diarrhea.  He has no melena or hematochezia.  He has no urinary complaints.  Patient offers no further specific complaints today.  REVIEW OF SYSTEMS:   Review of Systems  Constitutional:  Positive for malaise/fatigue. Negative for fever and weight loss.  Respiratory: Negative.  Negative for cough, hemoptysis and shortness of breath.   Cardiovascular: Negative.  Negative for chest pain and leg swelling.  Gastrointestinal: Negative.  Negative for blood in stool, diarrhea and melena.  Genitourinary: Negative.  Negative for hematuria.  Musculoskeletal: Negative.  Negative for back pain and joint pain.  Skin: Negative.  Negative for rash.  Neurological:  Positive for tingling, sensory change and weakness. Negative for dizziness, focal weakness and headaches.  Psychiatric/Behavioral: Negative.  Negative for depression and hallucinations. The patient is not nervous/anxious.     As per HPI. Otherwise, a  complete review of systems is negative.  PAST MEDICAL HISTORY: Past Medical History:  Diagnosis Date   A-fib (HCC)    Anemia    Arthritis    ankles   Cancer (HCC)    multiple myeloma   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HCC) 10/26/2019   Cardiomyopathy (HCC)    Chemotherapy-induced peripheral neuropathy (HCC)    CHF (congestive heart failure) (HCC)    Depression    Dysrhythmia    atrial fib   History of CVA (cerebrovascular accident)    HLD (hyperlipidemia)    Hypercholesteremia    Hypertension    Moderate tricuspid insufficiency    Multiple myeloma (HCC)    not being treated right now per pt 11/11/19   Pneumonia    Sleep apnea    No CPAP.  OSA resolved with wt loss.    PAST SURGICAL HISTORY: Past Surgical History:  Procedure Laterality Date   ATRIAL ABLATION SURGERY     CARDIAC SURGERY     A-Fib Ablations   COLONOSCOPY WITH PROPOFOL N/A 10/26/2019   Procedure: COLONOSCOPY WITH BIOPSY;  Surgeon: [name], MD;  Location: [facility];  Service: Endoscopy;  Laterality: N/A;   COLONOSCOPY WITH PROPOFOL N/A 09/03/2022   Procedure: COLONOSCOPY WITH PROPOFOL;  Surgeon: [name], MD;  Location: [facility];  Service: Endoscopy;  Laterality: N/A;   ESOPHAGOGASTRODUODENOSCOPY (EGD) WITH PROPOFOL N/A 10/26/2019   Procedure: ESOPHAGOGASTRODUODENOSCOPY (EGD) WITH BIOPSY;  Surgeon: [name], MD;  Location: [facility];  Service: Endoscopy;  Laterality: N/A;  sleep apnea   LAPAROSCOPIC PARTIAL COLECTOMY Right 11/17/2019   Procedure: LAPAROSCOPIC PARTIAL COLECTOMY RIGHT EXTENDED;  Surgeon: [name], MD;  Location: [facility] ORS;  Service: General;  Laterality: Right;  PORTACATH PLACEMENT N/A 12/28/2019   Procedure: INSERTION PORT-A-CATH Left subclavian;  Surgeon: [name], MD;  Location: [facility] OR[facility];  Service: General;  Laterality: N/A;    FAMILY HISTORY: Family History  Problem Relation Age of Onset   Healthy Mother     ADVANCED DIRECTIVES (Y/N):   N  HEALTH MAINTENANCE: Social History   Tobacco Use   Smoking status: Never    Passive exposure: Never   Smokeless tobacco: Never  Vaping Use   Vaping status: Never Used  Substance Use Topics   Alcohol use: Not Currently   Drug use: Never     Colonoscopy:  PAP:  Bone density:  Lipid panel:  No Known Allergies  Current Outpatient Medications  Medication Sig Dispense Refill   allopurinol (ZYLOPRIM) 100 MG tablet Take 200 mg by mouth daily.     apixaban (ELIQUIS) 5 MG TABS tablet Take 5 mg by mouth 2 (two) times daily.     atorvastatin (LIPITOR) 80 MG tablet Take 80 mg by mouth daily.     bumetanide (BUMEX) 2 MG tablet Take 2 mg by mouth 2 (two) times daily.     colchicine 0.6 MG tablet Take 1 tablet (0.6 mg total) by mouth 2 (two) times daily. Until flare resolves. 20 tablet 1   lidocaine-prilocaine (EMLA) cream Apply 1 Application topically daily.     magnesium chloride (SLOW-MAG) 64 MG TBEC SR tablet Take 1 tablet (64 mg total) by mouth daily. 60 tablet 1   spironolactone (ALDACTONE) 25 MG tablet Take 1 tablet by mouth every morning.     tamsulosin (FLOMAX) 0.4 MG CAPS capsule Take 1 capsule (0.4 mg total) by mouth daily after supper. 30 capsule 6   APPLE CIDER VINEGAR PO Take 30-45 mLs by mouth every other day.  (Patient not taking: Reported on 01/01/2024)     HYDROcodone-acetaminophen (NORCO) 5-325 MG tablet Take 1 tablet by mouth every 6 (six) hours as needed for moderate pain. (Patient not taking: Reported on 01/29/2024) 30 tablet 0   loperamide (IMODIUM A-D) 2 MG tablet Take 2 mg by mouth 4 (four) times daily as needed for diarrhea or loose stools. (Patient not taking: Reported on 01/29/2024)     MAGNESIUM PO Take by mouth. (Patient not taking: Reported on 01/29/2024)     Misc Natural Products (TART CHERRY ADVANCED PO) Take 60 mLs by mouth every other day. (Patient not taking: Reported on 12/27/2023)     Multiple Vitamins-Minerals (ZINC PO) Take by mouth. (Patient not taking:  Reported on 01/29/2024)     nifedipine 0.3 % ointment Place 1 Application rectally 4 (four) times daily. with Lidocaine 1.5% (Patient not taking: Reported on 01/29/2024) 30 g 0   prochlorperazine (COMPAZINE) 10 MG tablet Take 1 tablet (10 mg total) by mouth every 6 (six) hours as needed for nausea or vomiting. (Patient not taking: Reported on 01/29/2024) 60 tablet 1   tadalafil (CIALIS) 10 MG tablet Take 1-2 tablets (10-20 mg total) by mouth daily as needed for erectile dysfunction (take 45 minutes prior to secual activity). (Patient not taking: Reported on 01/29/2024) 30 tablet 6   traMADol (ULTRAM) 50 MG tablet Take 0.5-1 tablets (25-50 mg total) by mouth every 6 (six) hours as needed for severe pain (pain score 7-10). (Patient not taking: Reported on 01/29/2024) 30 tablet 0   Zinc Sulfate (ZINC 15 PO) Take by mouth. (Patient not taking: Reported on 01/29/2024)     No current facility-administered medications for this visit.   Facility-Administered Medications Ordered in  Other Visits  Medication Dose Route Frequency Provider Last Rate Last Admin   heparin lock flush 100 unit/mL  500 Units Intravenous Once [name], MD        OBJECTIVE: Vitals:   01/29/24 1035  BP: (!) 95/52  Pulse: (!) 58  Resp: 16  Temp: (!) 96.3 F (35.7 C)  SpO2: 100%       Body mass index is 27.32 kg/m.    ECOG FS:1 - Symptomatic but completely ambulatory  General: Well-developed, well-nourished, no acute distress. Eyes: Pink conjunctiva, anicteric sclera. HEENT: Normocephalic, moist mucous membranes. Lungs: No audible wheezing or coughing. Heart: Regular rate and rhythm. Abdomen: Soft, nontender, no obvious distention. Musculoskeletal: No edema, cyanosis, or clubbing. Neuro: Alert, answering all questions appropriately. Cranial nerves grossly intact. Skin: No rashes or petechiae noted. Psych: Normal affect.  LAB RESULTS:  Lab Results  Component Value Date   NA 136 01/29/2024   K 4.5 01/29/2024    CL 101 01/29/2024   CO2 25 01/29/2024   GLUCOSE 97 01/29/2024   BUN 63 (H) 01/29/2024   CREATININE 2.70 (H) 01/29/2024   CALCIUM 9.1 01/29/2024   PROT 8.4 (H) 01/29/2024   ALBUMIN 3.6 01/29/2024   AST 20 01/29/2024   ALT 14 01/29/2024   ALKPHOS 229 (H) 01/29/2024   BILITOT 0.8 01/29/2024   GFRNONAA 25 (L) 01/29/2024   GFRAA >60 07/29/2020    Lab Results  Component Value Date   WBC 2.3 (L) 01/29/2024   NEUTROABS 1.6 (L) 01/29/2024   HGB 7.3 (L) 01/29/2024   HCT 23.4 (L) 01/29/2024   MCV 92.1 01/29/2024   PLT 107 (L) 01/29/2024   Lab Results  Component Value Date   IRON 79 08/13/2023   TIBC 234 (L) 08/13/2023   IRONPCTSAT 34 08/13/2023   Lab Results  Component Value Date   FERRITIN 686 (H) 09/03/2023     STUDIES: NM PET Image Restag (PS) Skull Base To Thigh Result Date: 12/30/2023 CLINICAL DATA:  Subsequent treatment strategy for colon cancer. History of prostate cancer and lymphoma. EXAM: NUCLEAR MEDICINE PET SKULL BASE TO THIGH TECHNIQUE: 10.19 mCi F-18 FDG was injected intravenously. Full-ring PET imaging was performed from the skull base to thigh after the radiotracer. CT data was obtained and used for attenuation correction and anatomic localization. Fasting blood glucose: 83 mg/dl COMPARISON:  PET-CT 16/08/9603 and 03/12/2023. CT of the chest, abdomen and pelvis 12/07/2022. Scrotal ultrasound 11/15/2023 FINDINGS: Mediastinal blood pool activity: SUV max 2.4 Liver activity: SUV max 3.1 NECK: No hypermetabolic cervical lymph nodes are identified. No suspicious activity identified within the pharyngeal mucosal space. Incidental CT findings: Bilateral carotid atherosclerosis. CHEST: There are no hypermetabolic mediastinal, hilar or axillary lymph nodes. No hypermetabolic pulmonary activity or suspicious nodularity. Incidental CT findings: Left subclavian Port-A-Cath extends to the superior cavoatrial junction. Stable cardiomegaly and central enlargement of the pulmonary  arteries. Stable chronic linear scarring at the left lung base and asymmetric gynecomastia the left. ABDOMEN/PELVIS: There is no hypermetabolic activity within the liver, adrenal glands, spleen or pancreas. Enlarging and progressively hypermetabolic retrocaval mass which measures approximately 3.5 x 1.8 cm on image 104/6. This has an SUV max of 8.4 (previously 5.8). No other hypermetabolic lymph nodes identified in the abdomen or pelvis. Incidental CT findings: New large heterogeneous mass within the left scrotum with peripheral hypermetabolic activity. This is new compared with the prior PET-CT and was evaluated by scrotal ultrasound approximately 6 weeks ago. This appears enlarged from the ultrasound, measuring approximately 10.5 x  8.6 cm on image 175/6. Stable mild enlargement of the prostate gland without hypermetabolic activity. Stable postsurgical changes from previous right hemicolectomy. SKELETON: There is no hypermetabolic activity to suggest osseous metastatic disease. The diffuse marrow hypermetabolic activity seen on the most recent PET-CT has resolved, consistent with treatment changes. Focally decreased metabolic activity within the L2 and L3 vertebral bodies, likely related to prior radiation therapy. Incidental CT findings: none IMPRESSION: 1. Enlarging and progressively hypermetabolic retrocaval mass, consistent with progressive metastatic disease. 2. No other evidence of metastatic disease. The diffuse marrow hypermetabolic activity seen on the most recent PET-CT has resolved, consistent with treatment changes. 3. New large heterogeneous mass within the left scrotum with peripheral hypermetabolic activity, enlarged from recent scrotal ultrasound. This is nonspecific, but presumably represents a complex hydrocele based on prior ultrasound. Correlate clinically. 4. Aortic atherosclerosis. Electronically Signed   By: Carey Bullocks M.D.   On: 12/30/2023 16:14     ONCOLOGY HISTORY: Patient  completed cycle 9 of adjuvant FOLFOX on June 03, 2020.  Given his difficulties with treatment and declining performance status, treatment was discontinued altogether. Colonoscopy on September 03, 2022 did not reveal any significant pathology and recommendation was to repeat in 3 years.    ASSESSMENT: Stage IIIc colon cancer, smoldering myeloma, prostate cancer.  PLAN:    Stage IIIc colon cancer:  CT scan from December 07, 2022 reviewed independently with enlarging aortocaval node now measuring 1.9 x 1.4 cm.  PET scan results from Mar 15, 2023 reviewed independently with progressive retroperitoneal lymphadenopathy presumably related to patient's colon cancer.  Patient last received chemotherapy with FOLFIRI on October 15, 2023.  CEA has increased significantly and his most recent result is 396.  PET scan results from December 30, 2023 reviewed independently with progressive disease of hypermetabolic retrocaval mass, but no other evidence of metastatic disease.  Patient has completed SBRT to this lesion.  Will repeat PET scan at the end of May with follow-up 1 to 2 days later.  No other intervention is needed at this time.   Prostate cancer: Gleason's 3+4.  Patient's PSA increased and peaked at 39.34, but since completing treatment has decreased and his most recent result was 4.65. Nuclear medicine bone scan on December 13, 2022 was reported as negative.  PSMA PET per radiation oncology from Mar 21, 2023 did not reveal metastatic disease.  Patient completed XRT for local control disease on May 30, 2023.  Repeat imaging as above.  Can consider Eligard in the future.  Continue follow-up with urology as indicated. Smoldering myeloma: Chronic and unchanged.  Patient was initially diagnosed and treated in [place], [place] and treated with single agent Velcade in approximately 2013.  He declined maintenance treatment or referral for bone marrow transplant.  His M spike has ranged from 1.1-2.0 since July 2020.  His  most recent result was 1.6.  Over the same timeframe his IgG component has ranged from [phone].  His most recent result is 2344.  Finally, his kappa free light chains have ranged from 24.8 -75.3 with his most recent result reported at 36.2.  Nuclear med bone scan as above.  His most recent bone marrow biopsy completed on May 28, 2019 revealed only 10 to 15% plasma cells with no clonality reported.  Cytogenetics were also reported as normal.  Continue to monitor closely.  Anemia: Chronic and unchanged.  Patient's hemoglobin is stable at 7.3.  Patient declined blood transfusion at this time.  Monitor. Neutropenia: Resolved.  Previously, patient required [employer]  with treatments.  Continue with dose reduced FOLFIRI if chemotherapy is reinitiated.   Thrombocytopenia: Chronic and unchanged.  Patient's platelet count is 107 today.   Renal insufficiency: Chronic and unchanged.  Patient's most recent GFR is stable at 25.  Patient reports he saw his nephrologist in the past several weeks.  Hypomagnesia: Resolved. Peripheral neuropathy: Chronic and unchanged.  Patient reports gabapentin did not help.  Continue Lyrica 50 mg daily and was previously given a referral to neurology.   Hypotension: Chronic and unchanged.  Patient is asymptomatic.  Monitor.  Coping/anxiety: Patient was previously given a referral to [facility].  Patient reports he talks to them approximately once per week. CHF exacerbation/edema: Resolved.  Patient was recently at [facility] for nearly a month.  Continue follow-up with cardiology and nephrology. Swollen testicles: Ultrasound results noted.  Follow-up with urology as scheduled.    Patient expressed understanding and was in agreement with this plan. He also understands that He can call clinic at any time with any questions, concerns, or complaints.    [name], MD   01/29/2024 12:24 PM

## 2024-01-30 ENCOUNTER — Other Ambulatory Visit: Payer: Self-pay

## 2024-01-30 LAB — CEA: CEA: 331 ng/mL — ABNORMAL HIGH (ref 0.0–4.7)

## 2024-02-03 ENCOUNTER — Other Ambulatory Visit: Payer: Self-pay | Admitting: Radiation Oncology

## 2024-02-04 ENCOUNTER — Other Ambulatory Visit: Payer: Self-pay | Admitting: Oncology

## 2024-02-04 ENCOUNTER — Other Ambulatory Visit: Payer: Self-pay | Admitting: *Deleted

## 2024-02-04 ENCOUNTER — Encounter: Payer: Self-pay | Admitting: Urology

## 2024-02-04 ENCOUNTER — Ambulatory Visit: Payer: Medicare Other | Admitting: Urology

## 2024-02-04 MED ORDER — TAMSULOSIN HCL 0.4 MG PO CAPS: 0.4000 mg | ORAL_CAPSULE | Freq: Every day | ORAL | 6 refills | Status: DC

## 2024-02-12 ENCOUNTER — Inpatient Hospital Stay: Attending: Oncology | Admitting: Dietician

## 2024-02-12 NOTE — Progress Notes (Signed)
 Nutrition Follow Up:  Reached out to patient at home telephone# in response to MST screen.  Patient relayed his recent weight loss in response to fluid losses. He reports eating very well and having no concerns. He is exercising more. He denies NIS at this time.  He had little time to talk as he was expecting his speech therapist to come.  ST has been working with speech S/P CVA, he denies and swallowing concerns.  He's been weighing at home each day and is very comfortable at [facility] [phone]#.  Relayed that if he started to have difficulties to reach out to his RD at cancer center of return call for remote consult.  Chris George, RDN, LDN Registered Dietitian, [facility] Part Time Remote (Usual office hours: Tuesday-Thursday) Mobile: [phone] Remote Office: [phone]

## 2024-02-26 ENCOUNTER — Ambulatory Visit: Attending: Radiation Oncology | Admitting: Radiation Oncology

## 2024-02-26 DIAGNOSIS — Z923 Personal history of irradiation: Secondary | ICD-10-CM | POA: Insufficient documentation

## 2024-02-26 DIAGNOSIS — Z8546 Personal history of malignant neoplasm of prostate: Secondary | ICD-10-CM | POA: Insufficient documentation

## 2024-02-26 DIAGNOSIS — C786 Secondary malignant neoplasm of retroperitoneum and peritoneum: Secondary | ICD-10-CM | POA: Insufficient documentation

## 2024-02-26 DIAGNOSIS — Z510 Encounter for antineoplastic radiation therapy: Secondary | ICD-10-CM | POA: Insufficient documentation

## 2024-02-26 DIAGNOSIS — C184 Malignant neoplasm of transverse colon: Secondary | ICD-10-CM | POA: Insufficient documentation

## 2024-03-04 ENCOUNTER — Ambulatory Visit: Admitting: Urology

## 2024-03-04 DIAGNOSIS — F320 Major depressive disorder, single episode, mild: Secondary | ICD-10-CM | POA: Diagnosis not present

## 2024-03-04 DIAGNOSIS — C184 Malignant neoplasm of transverse colon: Secondary | ICD-10-CM | POA: Diagnosis not present

## 2024-03-11 ENCOUNTER — Telehealth: Payer: Self-pay | Admitting: *Deleted

## 2024-03-11 ENCOUNTER — Ambulatory Visit: Admitting: Urology

## 2024-03-11 VITALS — BP 88/47 | Wt 180.8 lb

## 2024-03-11 DIAGNOSIS — C184 Malignant neoplasm of transverse colon: Secondary | ICD-10-CM

## 2024-03-11 DIAGNOSIS — S3022XA Contusion of scrotum and testes, initial encounter: Secondary | ICD-10-CM

## 2024-03-11 DIAGNOSIS — C61 Malignant neoplasm of prostate: Secondary | ICD-10-CM

## 2024-03-11 NOTE — Telephone Encounter (Signed)
 The patient called in saying that he has issues with blood pressure and being tired and he does not have another appointment until June 4 with labs and then a PET scan and he was told by Dr. Adrian Alba that if he starts feeling bad he can give us  a call.  I tried calling him to see whether is his blood pressure going up for pressure going down.  When I tried it says that not available to take this call at this time.

## 2024-03-11 NOTE — Progress Notes (Signed)
   03/11/2024 2:21 PM   Chris George 1956-05-23 [id number]  Reason for visit: Left scrotal hematoma, history of prostate cancer, ED  HPI: Comorbid 68 year old male with history notable for stroke, heart failure, smoldering myeloma, stage III colon cancer treated with chemotherapy, surgery, and radiation, and prostate cancer treated with radiation in July 2024.  I saw him in early January 2025 and scrotal ultrasound showed a left-sided varicocele and he opted for conservative management. He was hospitalized for fluid overload at [facility] in January 2025.  Apparently he got out of his bed and had an acute episode of severe left scrotal pain with significant swelling, and scrotal ultrasound showed a spontaneous left scrotal hematoma measuring 9 cm.  [facility] urology was consulted and recommended conservative evaluation and holding blood thinners temporarily.  A follow-up scrotal ultrasound 1 week later showed stable hematoma.  He opted for conservative management.  He reports significant improvement in his scrotal hematoma.  He is able to move around pain-free and is even been riding his bike.  No longer requiring any pain medications.  On exam, has improved significantly, measures approximately 4 to 5 cm, left scrotum still firm but no skin changes, no pain.  PSA continues to decrease, most recently 1.89 from February 2025 from 42 at time of diagnosis.   I also reviewed his outside oncology notes regarding recurrence of suspected colon cancer with retrocaval enhancing mass, he received radiation to that lesion, repeat PET scan scheduled for June 2025.  - Continue conservative treatment for left scrotal hematoma  - RTC 6 months PSA prior  Lawerence Pressman, MD  [facility] [address], [address] [place], [place] [address] [phone]

## 2024-03-12 ENCOUNTER — Other Ambulatory Visit: Payer: Self-pay

## 2024-03-12 ENCOUNTER — Ambulatory Visit

## 2024-03-12 ENCOUNTER — Ambulatory Visit: Admitting: Nurse Practitioner

## 2024-03-12 ENCOUNTER — Telehealth: Payer: Self-pay | Admitting: *Deleted

## 2024-03-12 ENCOUNTER — Encounter: Payer: Self-pay | Admitting: Oncology

## 2024-03-12 ENCOUNTER — Other Ambulatory Visit

## 2024-03-12 NOTE — Telephone Encounter (Signed)
 Patient called in and said that he is still not got a return message and he is already called 3 times.  I went up to the [facility] team and they are getting him an appointment and labs for tomorrow.

## 2024-03-12 NOTE — Telephone Encounter (Signed)
 I was able to reach patient to follow up, he reports he is feeling weak and his blood pressure is running low. I advised that we can add him to scheduled tomorrow for port flush/lab/APP/Possible IVF tomorrow. He is agreeable to this. Scheduling message sent.  Lab orders placed for cbc, cmp, mag and hold tube. I advised that we cannot do blood tomorrow but we could try and set him up next week if needed.

## 2024-03-13 ENCOUNTER — Telehealth: Payer: Self-pay

## 2024-03-13 ENCOUNTER — Encounter: Payer: Self-pay | Admitting: Nurse Practitioner

## 2024-03-13 ENCOUNTER — Inpatient Hospital Stay: Attending: Oncology | Admitting: Nurse Practitioner

## 2024-03-13 ENCOUNTER — Ambulatory Visit

## 2024-03-13 ENCOUNTER — Inpatient Hospital Stay
Admission: EM | Admit: 2024-03-13 | Discharge: 2024-03-17 | DRG: 378 | Disposition: A | Discharge status: Home / Self Care | Source: Ambulatory Visit | Attending: Internal Medicine | Admitting: Internal Medicine

## 2024-03-13 ENCOUNTER — Other Ambulatory Visit: Payer: Self-pay

## 2024-03-13 ENCOUNTER — Inpatient Hospital Stay

## 2024-03-13 ENCOUNTER — Observation Stay

## 2024-03-13 VITALS — BP 90/48 | HR 58 | Temp 97.8°F | Resp 18

## 2024-03-13 DIAGNOSIS — N189 Chronic kidney disease, unspecified: Secondary | ICD-10-CM | POA: Diagnosis not present

## 2024-03-13 DIAGNOSIS — R590 Localized enlarged lymph nodes: Secondary | ICD-10-CM | POA: Diagnosis present

## 2024-03-13 DIAGNOSIS — I071 Rheumatic tricuspid insufficiency: Secondary | ICD-10-CM | POA: Diagnosis present

## 2024-03-13 DIAGNOSIS — I428 Other cardiomyopathies: Secondary | ICD-10-CM | POA: Diagnosis present

## 2024-03-13 DIAGNOSIS — K5521 Angiodysplasia of colon with hemorrhage: Principal | ICD-10-CM

## 2024-03-13 DIAGNOSIS — Z923 Personal history of irradiation: Secondary | ICD-10-CM

## 2024-03-13 DIAGNOSIS — D61818 Other pancytopenia: Secondary | ICD-10-CM | POA: Diagnosis present

## 2024-03-13 DIAGNOSIS — R5383 Other fatigue: Secondary | ICD-10-CM | POA: Diagnosis not present

## 2024-03-13 DIAGNOSIS — C61 Malignant neoplasm of prostate: Secondary | ICD-10-CM | POA: Diagnosis not present

## 2024-03-13 DIAGNOSIS — Z7901 Long term (current) use of anticoagulants: Secondary | ICD-10-CM

## 2024-03-13 DIAGNOSIS — I5032 Chronic diastolic (congestive) heart failure: Secondary | ICD-10-CM | POA: Diagnosis present

## 2024-03-13 DIAGNOSIS — D62 Acute posthemorrhagic anemia: Secondary | ICD-10-CM | POA: Diagnosis present

## 2024-03-13 DIAGNOSIS — G4733 Obstructive sleep apnea (adult) (pediatric): Secondary | ICD-10-CM | POA: Diagnosis present

## 2024-03-13 DIAGNOSIS — I272 Pulmonary hypertension, unspecified: Secondary | ICD-10-CM | POA: Diagnosis present

## 2024-03-13 DIAGNOSIS — D472 Monoclonal gammopathy: Secondary | ICD-10-CM | POA: Diagnosis not present

## 2024-03-13 DIAGNOSIS — F32A Depression, unspecified: Secondary | ICD-10-CM | POA: Diagnosis present

## 2024-03-13 DIAGNOSIS — N184 Chronic kidney disease, stage 4 (severe): Secondary | ICD-10-CM

## 2024-03-13 DIAGNOSIS — I130 Hypertensive heart and chronic kidney disease with heart failure and stage 1 through stage 4 chronic kidney disease, or unspecified chronic kidney disease: Secondary | ICD-10-CM | POA: Diagnosis present

## 2024-03-13 DIAGNOSIS — Z79899 Other long term (current) drug therapy: Secondary | ICD-10-CM

## 2024-03-13 DIAGNOSIS — D649 Anemia, unspecified: Secondary | ICD-10-CM | POA: Diagnosis not present

## 2024-03-13 DIAGNOSIS — I482 Chronic atrial fibrillation, unspecified: Secondary | ICD-10-CM | POA: Diagnosis present

## 2024-03-13 DIAGNOSIS — K922 Gastrointestinal hemorrhage, unspecified: Secondary | ICD-10-CM

## 2024-03-13 DIAGNOSIS — E7800 Pure hypercholesterolemia, unspecified: Secondary | ICD-10-CM | POA: Diagnosis present

## 2024-03-13 DIAGNOSIS — N179 Acute kidney failure, unspecified: Secondary | ICD-10-CM | POA: Diagnosis present

## 2024-03-13 DIAGNOSIS — R531 Weakness: Secondary | ICD-10-CM | POA: Diagnosis present

## 2024-03-13 DIAGNOSIS — Z85038 Personal history of other malignant neoplasm of large intestine: Secondary | ICD-10-CM

## 2024-03-13 DIAGNOSIS — C184 Malignant neoplasm of transverse colon: Secondary | ICD-10-CM | POA: Diagnosis not present

## 2024-03-13 DIAGNOSIS — Z9221 Personal history of antineoplastic chemotherapy: Secondary | ICD-10-CM

## 2024-03-13 DIAGNOSIS — I959 Hypotension, unspecified: Secondary | ICD-10-CM | POA: Diagnosis not present

## 2024-03-13 DIAGNOSIS — I429 Cardiomyopathy, unspecified: Secondary | ICD-10-CM

## 2024-03-13 DIAGNOSIS — Z8673 Personal history of transient ischemic attack (TIA), and cerebral infarction without residual deficits: Secondary | ICD-10-CM

## 2024-03-13 DIAGNOSIS — K641 Second degree hemorrhoids: Secondary | ICD-10-CM | POA: Diagnosis present

## 2024-03-13 DIAGNOSIS — R350 Frequency of micturition: Secondary | ICD-10-CM | POA: Diagnosis present

## 2024-03-13 DIAGNOSIS — C9000 Multiple myeloma not having achieved remission: Secondary | ICD-10-CM | POA: Diagnosis present

## 2024-03-13 DIAGNOSIS — Z6841 Body Mass Index (BMI) 40.0 and over, adult: Secondary | ICD-10-CM

## 2024-03-13 LAB — CBC WITH DIFFERENTIAL/PLATELET
Abs Immature Granulocytes: 0.01 10*3/uL (ref 0.00–0.07)
Basophils Absolute: 0 10*3/uL (ref 0.0–0.1)
Basophils Relative: 0 %
Eosinophils Absolute: 0.1 10*3/uL (ref 0.0–0.5)
Eosinophils Relative: 3 %
HCT: 15.8 % — ABNORMAL LOW (ref 39.0–52.0)
Hemoglobin: 4.9 g/dL — CL (ref 13.0–17.0)
Immature Granulocytes: 0 %
Lymphocytes Relative: 7 %
Lymphs Abs: 0.2 10*3/uL — ABNORMAL LOW (ref 0.7–4.0)
MCH: 28.7 pg (ref 26.0–34.0)
MCHC: 31 g/dL (ref 30.0–36.0)
MCV: 92.4 fL (ref 80.0–100.0)
Monocytes Absolute: 0.3 10*3/uL (ref 0.1–1.0)
Monocytes Relative: 12 %
Neutro Abs: 2.2 10*3/uL (ref 1.7–7.7)
Neutrophils Relative %: 78 %
Platelets: 123 10*3/uL — ABNORMAL LOW (ref 150–400)
RBC: 1.71 MIL/uL — ABNORMAL LOW (ref 4.22–5.81)
RDW: 16.3 % — ABNORMAL HIGH (ref 11.5–15.5)
WBC: 2.8 10*3/uL — ABNORMAL LOW (ref 4.0–10.5)
nRBC: 0 % (ref 0.0–0.2)

## 2024-03-13 LAB — CMP (CANCER CENTER ONLY)
ALT: 12 U/L (ref 0–44)
AST: 19 U/L (ref 15–41)
Albumin: 3.5 g/dL (ref 3.5–5.0)
Alkaline Phosphatase: 172 U/L — ABNORMAL HIGH (ref 38–126)
Anion gap: 8 (ref 5–15)
BUN: 83 mg/dL — ABNORMAL HIGH (ref 8–23)
CO2: 25 mmol/L (ref 22–32)
Calcium: 8.9 mg/dL (ref 8.9–10.3)
Chloride: 99 mmol/L (ref 98–111)
Creatinine: 3.17 mg/dL — ABNORMAL HIGH (ref 0.61–1.24)
GFR, Estimated: 21 mL/min — ABNORMAL LOW (ref >60)
Glucose, Bld: 135 mg/dL — ABNORMAL HIGH (ref 70–99)
Potassium: 4.1 mmol/L (ref 3.5–5.1)
Sodium: 132 mmol/L — ABNORMAL LOW (ref 135–145)
Total Bilirubin: 0.8 mg/dL (ref 0.0–1.2)
Total Protein: 7.5 g/dL (ref 6.5–8.1)

## 2024-03-13 LAB — MAGNESIUM: Magnesium: 2.1 mg/dL (ref 1.7–2.4)

## 2024-03-13 LAB — SAMPLE TO BLOOD BANK

## 2024-03-13 LAB — PREPARE RBC (CROSSMATCH)

## 2024-03-13 MED ORDER — SODIUM CHLORIDE 0.9 % IV SOLN
10.0000 mL/h | Freq: Once | INTRAVENOUS | Status: AC
  Administered 2024-03-13: 10 mL/h via INTRAVENOUS

## 2024-03-13 MED ORDER — TAMSULOSIN HCL 0.4 MG PO CAPS
0.4000 mg | ORAL_CAPSULE | Freq: Every day | ORAL | Status: DC
Start: 2024-03-14 — End: 2024-03-17
  Administered 2024-03-14 – 2024-03-16 (×3): 0.4 mg via ORAL
  Filled 2024-03-13 (×3): qty 1

## 2024-03-13 MED ORDER — PANTOPRAZOLE SODIUM 40 MG IV SOLR
40.0000 mg | Freq: Two times a day (BID) | INTRAVENOUS | Status: DC
  Administered 2024-03-13 – 2024-03-17 (×8): 40 mg via INTRAVENOUS
  Filled 2024-03-13 (×8): qty 10

## 2024-03-13 MED ORDER — ACETAMINOPHEN 325 MG PO TABS: 650.0000 mg | ORAL_TABLET | Freq: Four times a day (QID) | ORAL | Status: DC | PRN

## 2024-03-13 MED ORDER — ACETAMINOPHEN 650 MG RE SUPP: 650.0000 mg | Freq: Four times a day (QID) | RECTAL | Status: DC | PRN

## 2024-03-13 MED ORDER — POLYETHYLENE GLYCOL 3350 17 G PO PACK: 17.0000 g | PACK | Freq: Every day | ORAL | Status: DC | PRN

## 2024-03-13 MED ORDER — PANTOPRAZOLE SODIUM 40 MG IV SOLR
40.0000 mg | Freq: Once | INTRAVENOUS | Status: AC
  Administered 2024-03-13: 40 mg via INTRAVENOUS
  Filled 2024-03-13: qty 10

## 2024-03-13 MED ORDER — ATORVASTATIN CALCIUM 20 MG PO TABS
80.0000 mg | ORAL_TABLET | Freq: Every day | ORAL | Status: DC
  Administered 2024-03-13 – 2024-03-16 (×4): 80 mg via ORAL
  Filled 2024-03-13 (×4): qty 4

## 2024-03-13 MED ORDER — ALLOPURINOL 100 MG PO TABS
100.0000 mg | ORAL_TABLET | Freq: Every day | ORAL | Status: DC
  Administered 2024-03-14 – 2024-03-17 (×4): 100 mg via ORAL
  Filled 2024-03-13 (×4): qty 1

## 2024-03-13 MED ORDER — BUMETANIDE 1 MG PO TABS
2.0000 mg | ORAL_TABLET | Freq: Two times a day (BID) | ORAL | Status: DC
  Filled 2024-03-13: qty 2

## 2024-03-13 MED ORDER — MAGNESIUM CHLORIDE 64 MG PO TBEC
1.0000 | DELAYED_RELEASE_TABLET | Freq: Every day | ORAL | Status: DC
  Administered 2024-03-14 – 2024-03-16 (×3): 64 mg via ORAL
  Filled 2024-03-13 (×5): qty 1

## 2024-03-13 MED ORDER — SODIUM CHLORIDE 0.9% FLUSH
3.0000 mL | Freq: Two times a day (BID) | INTRAVENOUS | Status: DC
  Administered 2024-03-14 – 2024-03-17 (×6): 3 mL via INTRAVENOUS

## 2024-03-13 MED ORDER — SODIUM CHLORIDE 0.9 % IV SOLN: INTRAVENOUS | Status: DC

## 2024-03-13 NOTE — Progress Notes (Signed)
 Up until about 3 weeks ago he was feeling good, and getting stronger, then all of a sudden he started feeling very weak. He went to his kidney and heart doctor recently and they said he was doing much better. His blood pressure has been low.

## 2024-03-13 NOTE — Assessment & Plan Note (Signed)
 Per onc:  Smoldering myeloma: Chronic and unchanged. Patient was initially diagnosed and treated in [place], [place]  and treated with single agent Velcade in approximately 2013. He declined maintenance treatment or referral for bone marrow transplant. His M spike has ranged from 1.1-2.0 since July 2020. His most recent result was 1.6. Over the same timeframe his IgG component has ranged from [phone]. His most recent result is 2344. Finally, his kappa free light chains have ranged from 24.8 -75.3 with his most recent result reported at 36.2. Nuclear med bone scan as above. His most recent bone marrow biopsy completed on May 28, 2019 revealed only 10 to 15% plasma cells with no clonality reported. Cytogenetics were also reported as normal. Continue to monitor closely.

## 2024-03-13 NOTE — ED Provider Notes (Signed)
 [facility] Provider Note    Event Date/Time   First MD Initiated Contact with Patient 03/13/24 1655     (approximate)   History   Fatigue   HPI  Chris George is a 68 y.o. male   who presents to the emergency department today sent from cardiology clinic because of concerns for anemia.  The patient went there today because of fatigue.  He states that over the past 3 weeks he has been feeling more generalized fatigue and weakness.  He has noticed a couple episodes of bloody stools during this time.  No abdominal pain.  No fevers.  Does have a history of anemia in the past which she thinks is related to his cancers and has required transfusions in the past.      Physical Exam   Triage Vital Signs: ED Triage Vitals  Encounter Vitals Group     BP 03/13/24 1523 (!) 97/57     Systolic BP Percentile --      Diastolic BP Percentile --      Pulse Rate 03/13/24 1523 61     Resp 03/13/24 1523 17     Temp 03/13/24 1523 97.8 F (36.6 C)     Temp Source 03/13/24 1523 Oral     SpO2 03/13/24 1523 100 %     Weight 03/13/24 1525 178 lb (80.7 kg)     Height 03/13/24 1525 5\' 9"  (1.753 m)     Head Circumference --      Peak Flow --      Pain Score 03/13/24 1525 0     Pain Loc --      Pain Education --      Exclude from Growth Chart --     Most recent vital signs: Vitals:   03/13/24 1523  BP: (!) 97/57  Pulse: 61  Resp: 17  Temp: 97.8 F (36.6 C)  SpO2: 100%   General: Awake, alert, oriented. CV:  Good peripheral perfusion. Regular rate and rhythm. Resp:  Normal effort. Lungs clear Abd:  No distention.  Other:  GUIAC positive   ED Results / Procedures / Treatments   Labs (all labs ordered are listed, but only abnormal results are displayed) Labs Reviewed  TYPE AND SCREEN     EKG  None   RADIOLOGY None  PROCEDURES:  Critical Care performed: Yes  CRITICAL CARE Performed by: Marylynn Soho   Total critical care time: 30  minutes  Critical care time was exclusive of separately billable procedures and treating other patients.  Critical care was necessary to treat or prevent imminent or life-threatening deterioration.  Critical care was time spent personally by me on the following activities: development of treatment plan with patient and/or surrogate as well as nursing, discussions with consultants, evaluation of patient's response to treatment, examination of patient, obtaining history from patient or surrogate, ordering and performing treatments and interventions, ordering and review of laboratory studies, ordering and review of radiographic studies, pulse oximetry and re-evaluation of patient's condition.   Procedures    MEDICATIONS ORDERED IN ED: Medications - No data to display   IMPRESSION / MDM / ASSESSMENT AND PLAN / ED COURSE  I reviewed the triage vital signs and the nursing notes.                              Differential diagnosis includes, but is not limited to, anemia of chronic disease, iron deficiency,  GI bleed, neoplastic syndrome  Patient's presentation is most consistent with acute presentation with potential threat to life or bodily function.   The patient is on the cardiac monitor to evaluate for evidence of arrhythmia and/or significant heart rate changes.  Patient presented to the emergency department today from cancer center after blood work revealed anemia.  Patient is complaining of fatigue that is got worse over the past few weeks.  On exam patient is hypotensive here.  Not tachycardic.  Patient was guaiac positive.  I did discuss and consent patient for blood transfusion.  Additionally we will give IV Protonix .  Discussed with Dr. Cleda Curly with the hospitalist service who will evaluate for admission.    FINAL CLINICAL IMPRESSION(S) / ED DIAGNOSES   Final diagnoses:  Other fatigue  Anemia, unspecified type  Gastrointestinal hemorrhage, unspecified gastrointestinal hemorrhage  type        Rx / DC Orders    Note:  This document was prepared using Dragon voice recognition software and may include unintentional dictation errors.    Marylynn Soho, MD 03/13/24 2312

## 2024-03-13 NOTE — ED Triage Notes (Addendum)
 Pt sent from cancer center for low hemoglobin. Pt is CAOX4, reports weakness for the last 3 weeks and states he's had blood transfusions in the past. Pt being treated for prostate and colon cancer, pt had chemo/radiation about 3 months ago. Pt on eliquis.

## 2024-03-13 NOTE — Assessment & Plan Note (Addendum)
 Patient's BUN is up to 83 and creatinine over 3, his baseline seems to be BUN less than 63 and creatinine less than 3.  Therefore at this time I suspect patient has additional acute kidney injury due to volume loss .  Patient reports frequent micturition.  Therefore I will do a bladder scan.  Check urinalysis and sodium and creatinine.  Gentle IV hydration tonight.  Monitor intake output. No objection to c.w. chornic lasix .

## 2024-03-13 NOTE — Assessment & Plan Note (Signed)
 Hold spironolactone.

## 2024-03-13 NOTE — Assessment & Plan Note (Addendum)
 This does not seem to be an brisk GI bleed.  Therefore CT angiography at this time would not be helpful.  We will hold patient's Eliquis that he takes for atrial fibrillation and other antiplatelet agents.  Check INR and PTT.  I have requested evaluation from Dr. Emerick Hanlon of GI who will see the patient in the morning.  I have empirically started the patient on pantoprazole  although he is not likely to have upper GI bleed at this time. Clear liquid diet.

## 2024-03-13 NOTE — Assessment & Plan Note (Signed)
 Continue with core

## 2024-03-13 NOTE — Assessment & Plan Note (Signed)
 Per onc notes from 01/29/2024  Patient completed cycle 9 of adjuvant FOLFOX on June 03, 2020. Given his difficulties with treatment and declining performance status, treatment was discontinued altogether. Colonoscopy on September 03, 2022 did not reveal any significant pathology and recommendation was to repeat in 3 years.   CT scan from December 07, 2022  with enlarging aortocaval node now measuring 1.9 x 1.4 cm.  PET scan results from Mar 15, 2023  with progressive retroperitoneal lymphadenopathy presumably related to patient's colon cancer.  Patient last received chemotherapy with FOLFIRI on October 15, 2023.  CEA has increased significantly and his most recent result is 396.  PET scan results from December 30, 2023 reviewed independently with progressive disease of hypermetabolic retrocaval mass, but no other evidence of metastatic disease.  Patient has completed SBRT to this lesion.  Will repeat PET scan at the end of May with follow-up 1 to 2 days later.  No other intervention is needed at this time.

## 2024-03-13 NOTE — Telephone Encounter (Signed)
 Received critical hemoglobin from [facility] in [facility] lab. Hemoglobin 4.9. Kenney Peacemaker, NP notified.

## 2024-03-13 NOTE — Assessment & Plan Note (Signed)
 Rate controlled, patient does not use any beta-blockers at home, hold Eliquis as above

## 2024-03-13 NOTE — Progress Notes (Signed)
 Symptom Management Clinic  [name] Department of the [place]. [facility] [address] [place], [place] [address] [phone] (phone) [phone] (fax)  Patient Care Team: Samule Crofts Genetta Kenning, MD as PCP - General (Family Medicine) Shellie Dials, MD as Consulting Physician (Oncology) Bufford Carne, MD as Referring Physician (Neurology) Windell Hasty, MD as Consulting Physician (Nephrology) Marcheta Seta, MD as Referring Physician (Cardiology) Glenis Langdon, MD as Consulting Physician (Radiation Oncology)   Name of the patient: Chris George  [id number]  [birth year]   Date of visit: 03/13/24  Diagnosis- Colon Cancer & Prostate Cancer  Chief complaint/ Reason for visit- Weakness  Heme/Onc history:  Oncology History  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HCC)  10/27/2019 Initial Diagnosis   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HCC)   12/01/2019 Cancer Staging   Staging form: Colon and Rectum, AJCC 8th Edition - Clinical stage from 12/01/2019: Stage IIIC (cT3, cN2b, cM0) - Signed by Shellie Dials, MD on 12/01/2019   12/30/2019 - 06/03/2020 Chemotherapy   Patient is on Treatment Plan : COLORECTAL FOLFOX q14d x 6 months     05/15/2023 -  Chemotherapy   Patient is on Treatment Plan : COLORECTAL FOLFIRI q14d       Interval history- Chris George is a 68 y.o. 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  He says he just generally feels tired and rundown.  Had black stools for a few days which she had accounted to diet.  They have resolved.  He denies chest pain or shortness of breath.  Denies abdominal pain, nausea, vomiting.  No fevers or chills.  No unintentional weight loss.  At baseline he is active.  Drove  himself to clinic today.  Review of systems- Review of Systems  Constitutional:  Positive for malaise/fatigue. Negative for chills, fever and weight loss.  HENT:  Negative for congestion, hearing loss, nosebleeds, sore throat and tinnitus.   Eyes:  Negative for blurred vision and double vision.  Respiratory:  Negative for cough, hemoptysis, sputum production, shortness of breath and wheezing.   Cardiovascular:  Negative for chest pain, palpitations and leg swelling.  Gastrointestinal:  Positive for melena. Negative for abdominal pain, blood in stool, constipation, diarrhea, nausea and vomiting.  Genitourinary:  Negative for dysuria and urgency.  Musculoskeletal:  Negative for back pain, falls, joint pain and myalgias.  Skin:  Negative for itching and rash.  Neurological:  Negative for dizziness, tingling, sensory change, loss of consciousness, weakness and headaches.  Endo/Heme/Allergies:  Negative for environmental allergies. Does not bruise/bleed easily.  Psychiatric/Behavioral:  Negative for depression. The patient is not nervous/anxious and does not have insomnia.      No Known Allergies  Past Medical History:  Diagnosis Date   A-fib (HCC)    Anemia    Arthritis    ankles   Cancer (HCC)    multiple myeloma   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HCC) 10/26/2019   Cardiomyopathy (HCC)    Chemotherapy-induced peripheral neuropathy (HCC)    CHF (congestive heart failure) (HCC)    Depression    Dysrhythmia    atrial fib   History of CVA (cerebrovascular accident)    HLD (hyperlipidemia)    Hypercholesteremia    Hypertension    Moderate tricuspid insufficiency  Multiple myeloma (HCC)    not being treated right now per pt 11/11/19   Pneumonia    Sleep apnea    No CPAP.  OSA resolved with wt loss.    Past Surgical History:  Procedure Laterality Date   ATRIAL ABLATION SURGERY     CARDIAC SURGERY     A-Fib Ablations   COLONOSCOPY WITH PROPOFOL  N/A 10/26/2019   Procedure:  COLONOSCOPY WITH BIOPSY;  Surgeon: Marnee Sink, MD;  Location: [facility];  Service: Endoscopy;  Laterality: N/A;   COLONOSCOPY WITH PROPOFOL  N/A 09/03/2022   Procedure: COLONOSCOPY WITH PROPOFOL ;  Surgeon: Marnee Sink, MD;  Location: [facility];  Service: Endoscopy;  Laterality: N/A;   ESOPHAGOGASTRODUODENOSCOPY (EGD) WITH PROPOFOL  N/A 10/26/2019   Procedure: ESOPHAGOGASTRODUODENOSCOPY (EGD) WITH BIOPSY;  Surgeon: Marnee Sink, MD;  Location: [facility];  Service: Endoscopy;  Laterality: N/A;  sleep apnea   LAPAROSCOPIC PARTIAL COLECTOMY Right 11/17/2019   Procedure: LAPAROSCOPIC PARTIAL COLECTOMY RIGHT EXTENDED;  Surgeon: Emmalene Hare, MD;  Location: [facility];  Service: General;  Laterality: Right;   PORTACATH PLACEMENT N/A 12/28/2019   Procedure: INSERTION PORT-A-CATH Left subclavian;  Surgeon: Emmalene Hare, MD;  Location: [facility];  Service: General;  Laterality: N/A;    Social History   Socioeconomic History   Marital status: Single    Spouse name: Not on file   Number of children: Not on file   Years of education: Not on file   Highest education level: Not on file  Occupational History   Not on file  Tobacco Use   Smoking status: Never    Passive exposure: Never   Smokeless tobacco: Never  Vaping Use   Vaping status: Never Used  Substance and Sexual Activity   Alcohol use: Not Currently   Drug use: Never   Sexual activity: Yes    Birth control/protection: None  Other Topics Concern   Not on file  Social History Narrative   Not on file   Social Drivers of Health   Financial Resource Strain: Low Risk  (12/18/2023)   Received from [facility]   Overall Financial Resource Strain (CARDIA)    Difficulty of Paying Living Expenses: Not hard at all  Recent Concern: Financial Resource Strain - Medium Risk (11/08/2023)   Overall Financial Resource Strain (CARDIA)    Difficulty of Paying Living Expenses: Somewhat hard  Food  Insecurity: No Food Insecurity (12/18/2023)   Received from [facility]   Hunger Vital Sign    Worried About Running Out of Food in the Last Year: Never true    Ran Out of Food in the Last Year: Never true  Recent Concern: Food Insecurity - Food Insecurity Present (11/11/2023)   Hunger Vital Sign    Worried About Running Out of Food in the Last Year: Sometimes true    Ran Out of Food in the Last Year: Sometimes true  Transportation Needs: No Transportation Needs (12/18/2023)   Received from [facility]   PRAPARE - Transportation    In the past 12 months, has lack of transportation kept you from medical appointments or from getting medications?: No    Lack of Transportation (Non-Medical): No  Physical Activity: Inactive (11/08/2023)   Exercise Vital Sign    Days of Exercise per Week: 0 days    Minutes of Exercise per Session: 0 min  Stress: Stress Concern Present (05/28/2019)   [employer] of Occupational Health - Occupational Stress Questionnaire    Feeling  of Stress : To some extent  Social Connections: Moderately Integrated (11/08/2023)   Social Connection and Isolation Panel [NHANES]    Frequency of Communication with Friends and Family: More than three times a week    Frequency of Social Gatherings with Friends and Family: More than three times a week    Attends Religious Services: More than 4 times per year    Active Member of [employer] or Organizations: Yes    Attends Engineer, structural: More than 4 times per year    Marital Status: Divorced  Intimate Partner Violence: Not At Risk (05/28/2019)   Humiliation, Afraid, Rape, and Kick questionnaire    Fear of Current or Ex-Partner: No    Emotionally Abused: No    Physically Abused: No    Sexually Abused: No    Family History  Problem Relation Age of Onset   Healthy Mother      Current Outpatient Medications:    allopurinol (ZYLOPRIM) 100 MG tablet, Take 200 mg by mouth daily., Disp:  , Rfl:    apixaban (ELIQUIS) 5 MG TABS tablet, Take 5 mg by mouth 2 (two) times daily., Disp: , Rfl:    APPLE CIDER VINEGAR PO, Take 30-45 mLs by mouth every other day.  (Patient not taking: Reported on 01/01/2024), Disp: , Rfl:    atorvastatin (LIPITOR) 80 MG tablet, Take 80 mg by mouth daily., Disp: , Rfl:    bumetanide (BUMEX) 2 MG tablet, Take 2 mg by mouth 2 (two) times daily., Disp: , Rfl:    colchicine  0.6 MG tablet, Take 1 tablet (0.6 mg total) by mouth 2 (two) times daily. Until flare resolves., Disp: 20 tablet, Rfl: 1   HYDROcodone -acetaminophen  (NORCO) 5-325 MG tablet, Take 1 tablet by mouth every 6 (six) hours as needed for moderate pain. (Patient not taking: Reported on 01/29/2024), Disp: 30 tablet, Rfl: 0   lidocaine -prilocaine  (EMLA ) cream, Apply 1 Application topically daily., Disp: , Rfl:    loperamide (IMODIUM A-D) 2 MG tablet, Take 2 mg by mouth 4 (four) times daily as needed for diarrhea or loose stools. (Patient not taking: Reported on 01/29/2024), Disp: , Rfl:    magnesium  chloride (SLOW-MAG) 64 MG TBEC SR tablet, Take 1 tablet (64 mg total) by mouth daily., Disp: 60 tablet, Rfl: 1   MAGNESIUM  PO, Take by mouth. (Patient not taking: Reported on 01/29/2024), Disp: , Rfl:    Misc Natural Products (TART CHERRY ADVANCED PO), Take 60 mLs by mouth every other day. (Patient not taking: Reported on 12/27/2023), Disp: , Rfl:    Multiple Vitamins-Minerals (ZINC PO), Take by mouth. (Patient not taking: Reported on 01/29/2024), Disp: , Rfl:    nifedipine  0.3 % ointment, Place 1 Application rectally 4 (four) times daily. with Lidocaine  1.5% (Patient not taking: Reported on 01/29/2024), Disp: 30 g, Rfl: 0   prochlorperazine  (COMPAZINE ) 10 MG tablet, Take 1 tablet (10 mg total) by mouth every 6 (six) hours as needed for nausea or vomiting. (Patient not taking: Reported on 01/29/2024), Disp: 60 tablet, Rfl: 1   spironolactone (ALDACTONE) 25 MG tablet, Take 1 tablet by mouth every morning., Disp: , Rfl:     tadalafil  (CIALIS ) 10 MG tablet, Take 1-2 tablets (10-20 mg total) by mouth daily as needed for erectile dysfunction (take 45 minutes prior to secual activity). (Patient not taking: Reported on 01/29/2024), Disp: 30 tablet, Rfl: 6   tamsulosin  (FLOMAX ) 0.4 MG CAPS capsule, Take 1 capsule (0.4 mg total) by mouth daily after supper., Disp: 30 capsule,  Rfl: 6   traMADol  (ULTRAM ) 50 MG tablet, Take 0.5-1 tablets (25-50 mg total) by mouth every 6 (six) hours as needed for severe pain (pain score 7-10). (Patient not taking: Reported on 01/29/2024), Disp: 30 tablet, Rfl: 0   Zinc Sulfate (ZINC 15 PO), Take by mouth. (Patient not taking: Reported on 01/29/2024), Disp: , Rfl:  No current facility-administered medications for this visit.  Facility-Administered Medications Ordered in Other Visits:    heparin  lock flush 100 unit/mL, 500 Units, Intravenous, Once, Shellie Dials, MD  Physical exam:  Vitals:   03/13/24 1435  BP: (!) 90/48  Pulse: (!) 58  Resp: 18  Temp: 97.8 F (36.6 C)  TempSrc: Tympanic  SpO2: 99%   Physical Exam Vitals reviewed.  Constitutional:      Appearance: He is well-developed. He is not ill-appearing.     Comments: Fatigued appearing. Unaccompanied. Ambulatory  HENT:     Head: Normocephalic and atraumatic.     Nose: Nose normal.     Mouth/Throat:     Pharynx: No oropharyngeal exudate.  Eyes:     General: No scleral icterus.    Conjunctiva/sclera: Conjunctivae normal.     Comments: pale  Cardiovascular:     Rate and Rhythm: Normal rate and regular rhythm.  Pulmonary:     Effort: Pulmonary effort is normal.     Breath sounds: Normal breath sounds. No wheezing.  Abdominal:     General: There is no distension.     Palpations: Abdomen is soft.     Tenderness: There is no abdominal tenderness.  Skin:    General: Skin is warm and dry.     Coloration: Skin is pale.     Findings: No bruising.  Neurological:     Mental Status: He is alert and oriented to  person, place, and time.     Gait: Gait normal.  Psychiatric:        Behavior: Behavior normal.        Latest Ref Rng & Units 03/13/2024    1:51 PM  CMP  Glucose 70 - 99 mg/dL 811   BUN 8 - 23 mg/dL 83   Creatinine 9.14 - 1.24 mg/dL 7.82   Sodium 956 - 213 mmol/L 132   Potassium 3.5 - 5.1 mmol/L 4.1   Chloride 98 - 111 mmol/L 99   CO2 22 - 32 mmol/L 25   Calcium  8.9 - 10.3 mg/dL 8.9   Total Protein 6.5 - 8.1 g/dL 7.5   Total Bilirubin 0.0 - 1.2 mg/dL 0.8   Alkaline Phos 38 - 126 U/L 172   AST 15 - 41 U/L 19   ALT 0 - 44 U/L 12       Latest Ref Rng & Units 03/13/2024    1:51 PM  CBC  WBC 4.0 - 10.5 K/uL 2.8   Hemoglobin 13.0 - 17.0 g/dL 4.9   Hematocrit 08.6 - 52.0 % 15.8   Platelets 150 - 400 K/uL 123    Iron/TIBC/Ferritin/ %Sat    Component Value Date/Time   IRON 79 08/13/2023 1145   TIBC 234 (L) 08/13/2023 1145   FERRITIN 686 (H) 09/03/2023 1307   IRONPCTSAT 34 08/13/2023 1145    No results found.  Assessment and plan- Patient is a 68 y.o. male   Acute on Chronic Anemia- baseline hemoglobin around 7.9. Thought to be secondary to CKD. Not on EPO due to malignancy. Iron stores pending. Hmg 4.9 today with recent black stools. Recommend ER for further workup and  transfusion. Patient agreeable.  CKD- followed by [facility]. Creatinine is worse but GFR was previously 22 at his visit in April 2025.  Port-a-cath- will leave accessed for ER.  Report called to ER. Nursing to transport.   Disposition:  Follow up following discharge.    Visit Diagnosis 1. Acute on chronic anemia    Patient expressed understanding and was in agreement with this plan. He also understands that He can call clinic at any time with any questions, concerns, or complaints.   Thank you for allowing me to participate in the care of this very pleasant patient.   Kenney Peacemaker, DNP, AGNP-C, AOCNP [facility] [phone]

## 2024-03-13 NOTE — Assessment & Plan Note (Signed)
 Per onc: Gleason's 3+4. Patient's PSA increased and peaked at 39.34, but since completing treatment has decreased and his most recent result was 4.65. Nuclear medicine bone scan on December 13, 2022 was reported as negative. PSMA PET per radiation oncology from Mar 21, 2023 did not reveal metastatic disease. Patient completed XRT for local control disease on May 30, 2023.

## 2024-03-13 NOTE — Assessment & Plan Note (Signed)
 This is chronically documented in the chart, continue with patient Bumex tablet.  At this time patient's blood pressure is too soft to consider beta-blockers.  Actually I will hold patient's spironolactone as well.

## 2024-03-13 NOTE — H&P (Addendum)
 History and Physical    Patient: Chris George UEA:[id number] DOB: [birth year] DOA: 03/13/2024 DOS: the patient was seen and examined on 03/13/2024 PCP: Lorrie Rothman, MD  Patient coming from: sent from oncology office.  Chief Complaint:  Chief Complaint  Patient presents with   Fatigue   HPI: Chris George is a 68 y.o. male with medical history significant of medical issues as below including malignancy as described.  S/p chemotherapy and radiation therapy.  Patient states he was in his usual state of health till about 5 weeks ago when he started noticing intermittent episodes of his stool being maroon/reddish in color.  Patient does not report any diarrhea vomiting skin bruising or nosebleeding or gum bleeding etc.  Patient did not pay my much attention to it.  Patient states that the episodes of red-colored stool actually stopped about a week ago.  However patient reports a new onset of progressive fatigue which is exertional in nature, relieved by rest.  Associated with slight sensation of shortness of breath.  In spite of this patient has been able to maintain a good functional status including climbing up to his second level apartment.    Patient denies any fever chest pain coughing leg swelling palpitation or loss of consciousness.  Patient evaluated at oncology clinic earlier today and found to have severe anemia and is transferred to [facility] ER.  Patient has been ordered for 2 units of PRBC.  Patient is currently asymptomatic as he is at rest.  Medical evaluation is sought.  Review of Systems: As mentioned in the history of present illness. All other systems reviewed and are negative. Past Medical History:  Diagnosis Date   A-fib (HCC)    Anemia    Arthritis    ankles   Cancer (HCC)    multiple myeloma   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HCC) 10/26/2019   Cardiomyopathy (HCC)    Chemotherapy-induced peripheral neuropathy (HCC)    CHF (congestive heart failure) (HCC)    Depression     Dysrhythmia    atrial fib   History of CVA (cerebrovascular accident)    HLD (hyperlipidemia)    Hypercholesteremia    Hypertension    Moderate tricuspid insufficiency    Multiple myeloma (HCC)    not being treated right now per pt 11/11/19   Pneumonia    Sleep apnea    No CPAP.  OSA resolved with wt loss.   Past Surgical History:  Procedure Laterality Date   ATRIAL ABLATION SURGERY     CARDIAC SURGERY     A-Fib Ablations   COLONOSCOPY WITH PROPOFOL  N/A 10/26/2019   Procedure: COLONOSCOPY WITH BIOPSY;  Surgeon: Marnee Sink, MD;  Location: [facility];  Service: Endoscopy;  Laterality: N/A;   COLONOSCOPY WITH PROPOFOL  N/A 09/03/2022   Procedure: COLONOSCOPY WITH PROPOFOL ;  Surgeon: Marnee Sink, MD;  Location: [facility];  Service: Endoscopy;  Laterality: N/A;   ESOPHAGOGASTRODUODENOSCOPY (EGD) WITH PROPOFOL  N/A 10/26/2019   Procedure: ESOPHAGOGASTRODUODENOSCOPY (EGD) WITH BIOPSY;  Surgeon: Marnee Sink, MD;  Location: [facility];  Service: Endoscopy;  Laterality: N/A;  sleep apnea   LAPAROSCOPIC PARTIAL COLECTOMY Right 11/17/2019   Procedure: LAPAROSCOPIC PARTIAL COLECTOMY RIGHT EXTENDED;  Surgeon: Emmalene Hare, MD;  Location: [facility];  Service: General;  Laterality: Right;   PORTACATH PLACEMENT N/A 12/28/2019   Procedure: INSERTION PORT-A-CATH Left subclavian;  Surgeon: Emmalene Hare, MD;  Location: [facility];  Service: General;  Laterality: N/A;   Social History:  reports that he has  never smoked. He has never been exposed to tobacco smoke. He has never used smokeless tobacco. He reports that he does not currently use alcohol. He reports that he does not use drugs.  No Known Allergies  Family History  Problem Relation Age of Onset   Healthy Mother     Prior to Admission medications   Medication Sig Start Date End Date Taking? Authorizing Provider  allopurinol (ZYLOPRIM) 100 MG tablet Take 200 mg by mouth daily. 11/01/22  Yes [provider]   apixaban (ELIQUIS) 5 MG TABS tablet Take 5 mg by mouth 2 (two) times daily.   Yes [provider]  atorvastatin (LIPITOR) 80 MG tablet Take 80 mg by mouth daily.   Yes [provider]  bumetanide (BUMEX) 2 MG tablet Take 2 mg by mouth 2 (two) times daily. 12/17/23 12/11/24 Yes [provider]  colchicine  0.6 MG tablet Take 1 tablet (0.6 mg total) by mouth 2 (two) times daily. Until flare resolves. 04/22/23  Yes Nancy Axon B, PA-C  lidocaine -prilocaine  (EMLA ) cream Apply 1 Application topically daily.   Yes [provider]  magnesium  chloride (SLOW-MAG) 64 MG TBEC SR tablet Take 1 tablet (64 mg total) by mouth daily. 11/08/23  Yes Borders, Carlene Che, NP  spironolactone (ALDACTONE) 25 MG tablet Take 1 tablet by mouth every morning. 12/17/23 12/11/24 Yes [provider]  tamsulosin  (FLOMAX ) 0.4 MG CAPS capsule Take 1 capsule (0.4 mg total) by mouth daily after supper. 02/04/24  Yes Chrystal, Allison Ivory, MD  APPLE CIDER VINEGAR PO Take 30-45 mLs by mouth every other day.  Patient not taking: Reported on 01/01/2024    [provider]  traMADol  (ULTRAM ) 50 MG tablet Take 0.5-1 tablets (25-50 mg total) by mouth every 6 (six) hours as needed for severe pain (pain score 7-10). Patient not taking: Reported on 12/27/2023 12/24/23   Lawerence Pressman, MD  Zinc Sulfate (ZINC 15 PO) Take by mouth. Patient not taking: Reported on 12/27/2023    [provider]    Physical Exam: Vitals:   03/13/24 1812 03/13/24 1815 03/13/24 1833 03/13/24 1917  BP: (!) 94/57 (!) 94/57 94/65 104/61  Pulse: 71 63 66 67  Resp: 16 (!) 22 16 12   Temp: 97.8 F (36.6 C)  97.9 F (36.6 C) 97.7 F (36.5 C)  TempSrc: Oral  Oral Oral  SpO2:  100%  100%  Weight:      Height:       General: Patient is alert and awake, appears to be in no distress gives a fully coherent account of his symptoms Respiratory exam: Bilateral intravesicular Cardiovascular exam S1-S2 normal Abdomen all  quadrants are soft nontender hyperactive bowel sounds Extremities warm without edema Rectal exam performed by ER attending: Reported to me verbally with maroon-colored stools. Data Reviewed:  Labs on Admission:  Results for orders placed or performed during the hospital encounter of 03/13/24 (from the past 24 hours)  Type and screen [facility]     Status: None (Preliminary result)   Collection Time: 03/13/24  3:30 PM  Result Value Ref Range   ABO/RH(D) O POS    Antibody Screen NEG    Sample Expiration 03/16/2024,2359    Unit Number [id number]    Blood Component Type RBC, LR IRR    Unit division 00    Status of Unit ISSUED    Transfusion Status OK TO TRANSFUSE    Crossmatch Result      Compatible Performed at [facility],  [address], [place], [place] [address]    Unit Number [id number]    Blood Component Type RBC, LR IRR    Unit division 00    Status of Unit ALLOCATED    Transfusion Status OK TO TRANSFUSE    Crossmatch Result Compatible   Prepare RBC (crossmatch)     Status: None   Collection Time: 03/13/24  6:00 PM  Result Value Ref Range   Order Confirmation      ORDER PROCESSED BY BLOOD BANK Performed at [facility], [address] [place], [place] [address]    *Note: Due to a large number of results and/or encounters for the requested time period, some results have not been displayed. A complete set of results can be found in Results Review.   Basic Metabolic Panel: Recent Labs  Lab 03/13/24 1351  NA 132*  K 4.1  CL 99  CO2 25  GLUCOSE 135*  BUN 83*  CREATININE 3.17*  CALCIUM  8.9  MG 2.1   Liver Function Tests: Recent Labs  Lab 03/13/24 1351  AST 19  ALT 12  ALKPHOS 172*  BILITOT 0.8  PROT 7.5  ALBUMIN 3.5   No results for input(s): "LIPASE", "AMYLASE" in the last 168 hours. No results for input(s): "AMMONIA" in the last 168 hours. CBC: Recent Labs  Lab 03/13/24 1351  WBC 2.8*  NEUTROABS  2.2  HGB 4.9*  HCT 15.8*  MCV 92.4  PLT 123*   Cardiac Enzymes: No results for input(s): "CKTOTAL", "CKMB", "CKMBINDEX", "TROPONINIHS" in the last 168 hours.  BNP (last 3 results) No results for input(s): "PROBNP" in the last 8760 hours. CBG: No results for input(s): "GLUCAP" in the last 168 hours.  Radiological Exams on Admission:  No results found.  chest X-ray   No intake/output data recorded. No intake/output data recorded.       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  However his hemoglobin today is 4.9 down from at least above 7 more recently.  At this time etiology of acute on chronic anemia seems to be patient's lower GI bleed.  Anemia symptomatic and getting 2 unit PRBC tonight.  We will recheck patient hemoglobin around midnight tonight. Target would be> 7 gm/dl  Lower GI bleed This does not seem to be an brisk GI bleed.  Therefore CT angiography at this time would not be helpful.  We will hold patient's Eliquis that he takes for atrial fibrillation and other antiplatelet agents.  Check INR and PTT.  I have requested evaluation from Dr. Emerick Hanlon of GI who will see the patient in the morning.  I have empirically started the patient on pantoprazole  although he is not likely to have upper GI bleed at this time. Clear liquid diet.  CKD (chronic kidney disease) stage 4, GFR 15-29 ml/min (HCC) Patient's BUN is up to 83 and creatinine over 3, his baseline seems to be BUN less than 63 and creatinine less than 3.  Therefore at this time I suspect patient has additional acute kidney injury due to volume loss .  Patient reports frequent micturition.  Therefore I will do a bladder scan.  Check urinalysis and sodium and creatinine.  Gentle IV hydration tonight.  Monitor intake output. No objection to c.w. chornic lasix .  Malignant neoplasm of prostate (HCC) Per onc: Gleason's 3+4. Patient's PSA  increased and peaked at 39.34, but since completing treatment has decreased and his most recent result was  4.65. Nuclear medicine bone scan on December 13, 2022 was reported as negative. PSMA PET per radiation oncology from Mar 21, 2023 did not reveal metastatic disease. Patient completed XRT for local control disease on May 30, 2023.   Personal history of colon cancer Per onc notes from 01/29/2024  Patient completed cycle 9 of adjuvant FOLFOX on June 03, 2020. Given his difficulties with treatment and declining performance status, treatment was discontinued altogether. Colonoscopy on September 03, 2022 did not reveal any significant pathology and recommendation was to repeat in 3 years.   CT scan from December 07, 2022  with enlarging aortocaval node now measuring 1.9 x 1.4 cm.  PET scan results from Mar 15, 2023  with progressive retroperitoneal lymphadenopathy presumably related to patient's colon cancer.  Patient last received chemotherapy with FOLFIRI on October 15, 2023.  CEA has increased significantly and his most recent result is 396.  PET scan results from December 30, 2023 reviewed independently with progressive disease of hypermetabolic retrocaval mass, but no other evidence of metastatic disease.  Patient has completed SBRT to this lesion.  Will repeat PET scan at the end of May with follow-up 1 to 2 days later.  No other intervention is needed at this time.    Hypertension, essential, benign Hold spironolactone  Hyperlipidemia Continue with Lipitor  Chronic a-fib (HCC) Rate controlled, patient does not use any beta-blockers at home, hold Eliquis as above  Cardiomyopathy, idiopathic (HCC) This is chronically documented in the chart, continue with patient Bumex tablet.  At this time patient's blood pressure is too soft to consider beta-blockers.  Actually I will hold patient's spironolactone as well.  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 Patient  was initially diagnosed and treated in [place], [place]  and treated with single agent Velcade in approximately 2013. He declined maintenance treatment or referral for bone marrow transplant. His M spike has ranged from 1.1-2.0 since July 2020. His most recent result was 1.6. Over the same timeframe his IgG component has ranged from [phone]. His most recent result is 2344. Finally, his kappa free light chains have ranged from 24.8 -75.3 with his most recent result reported at 36.2. Nuclear med bone scan as above. His most recent bone marrow biopsy completed on May 28, 2019 revealed only 10 to 15% plasma cells with no clonality reported. Cytogenetics were also reported as normal. Continue to monitor closely.   DVT ppx - SCD    Advance Care Planning:   Code Status: Full Code   Consults: GI as mentioned above.  Family Communication: per pateitn.  Severity of Illness: The appropriate patient status for this patient is INPATIENT. Inpatient status is judged to be reasonable and necessary in order to provide the required intensity of service to ensure the patient's safety. The patient's presenting symptoms, physical exam findings, and initial radiographic and laboratory data in the context of their chronic comorbidities is felt to place them at high risk for further clinical deterioration. Furthermore, it is not anticipated that the patient will be medically stable for discharge from the hospital within 2 midnights of admission.   * I certify that at the point of admission it is my clinical judgment that the patient will require inpatient hospital care spanning beyond 2 midnights from the point of admission due to high intensity of service, high risk for further deterioration and high frequency of surveillance required.*  Author: Bennie Brave, MD 03/13/2024 7:54 PM  For on call review [email or web address].

## 2024-03-13 NOTE — Assessment & Plan Note (Signed)
 Patient seems to be chronically pancytopenic, outpatient workup in this regard.  However his hemoglobin today is 4.9 down from at least above 7 more recently.  At this time etiology of acute on chronic anemia seems to be patient's lower GI bleed.  Anemia symptomatic and getting 2 unit PRBC tonight.  We will recheck patient hemoglobin around midnight tonight. Target would be> 7 gm/dl

## 2024-03-14 DIAGNOSIS — D649 Anemia, unspecified: Secondary | ICD-10-CM | POA: Diagnosis not present

## 2024-03-14 LAB — CBC
HCT: 20.2 % — ABNORMAL LOW (ref 39.0–52.0)
Hemoglobin: 6.6 g/dL — ABNORMAL LOW (ref 13.0–17.0)
MCH: 28.9 pg (ref 26.0–34.0)
MCHC: 32.7 g/dL (ref 30.0–36.0)
MCV: 88.6 fL (ref 80.0–100.0)
Platelets: 126 10*3/uL — ABNORMAL LOW (ref 150–400)
RBC: 2.28 MIL/uL — ABNORMAL LOW (ref 4.22–5.81)
RDW: 18 % — ABNORMAL HIGH (ref 11.5–15.5)
WBC: 3.5 10*3/uL — ABNORMAL LOW (ref 4.0–10.5)
nRBC: 0 % (ref 0.0–0.2)

## 2024-03-14 LAB — URINALYSIS, COMPLETE (UACMP) WITH MICROSCOPIC
Bacteria, UA: NONE SEEN
Bilirubin Urine: NEGATIVE
Glucose, UA: NEGATIVE mg/dL
Hgb urine dipstick: NEGATIVE
Ketones, ur: NEGATIVE mg/dL
Leukocytes,Ua: NEGATIVE
Nitrite: NEGATIVE
Protein, ur: NEGATIVE mg/dL
Specific Gravity, Urine: 1.01 (ref 1.005–1.030)
pH: 6 (ref 5.0–8.0)

## 2024-03-14 LAB — APTT: aPTT: 30 s (ref 24–36)

## 2024-03-14 LAB — BASIC METABOLIC PANEL WITH GFR
Anion gap: 9 (ref 5–15)
BUN: 76 mg/dL — ABNORMAL HIGH (ref 8–23)
CO2: 26 mmol/L (ref 22–32)
Calcium: 9.1 mg/dL (ref 8.9–10.3)
Chloride: 101 mmol/L (ref 98–111)
Creatinine, Ser: 2.9 mg/dL — ABNORMAL HIGH (ref 0.61–1.24)
GFR, Estimated: 23 mL/min — ABNORMAL LOW (ref >60)
Glucose, Bld: 99 mg/dL (ref 70–99)
Potassium: 4.2 mmol/L (ref 3.5–5.1)
Sodium: 136 mmol/L (ref 135–145)

## 2024-03-14 LAB — CEA: CEA: 235 ng/mL — ABNORMAL HIGH (ref 0.0–4.7)

## 2024-03-14 LAB — CREATININE, URINE, RANDOM: Creatinine, Urine: 94 mg/dL

## 2024-03-14 LAB — PROTIME-INR
INR: 1.6 — ABNORMAL HIGH (ref 0.8–1.2)
Prothrombin Time: 19.5 s — ABNORMAL HIGH (ref 11.4–15.2)

## 2024-03-14 LAB — PREPARE RBC (CROSSMATCH)

## 2024-03-14 LAB — HIV ANTIBODY (ROUTINE TESTING W REFLEX): HIV Screen 4th Generation wRfx: NONREACTIVE

## 2024-03-14 LAB — SODIUM, URINE, RANDOM: Sodium, Ur: 47 mmol/L

## 2024-03-14 MED ORDER — SODIUM CHLORIDE 0.9% IV SOLUTION: Freq: Once | INTRAVENOUS | Status: AC

## 2024-03-14 NOTE — Progress Notes (Addendum)
 Progress Note    [name]  MWU:[id number] DOB: 1955/12/16  DOA: 03/13/2024 PCP: Lorrie Rothman, MD      Brief Narrative:    Medical records reviewed and are as summarized below:  Chris George is a 68 y.o. male  with medical history significant of medical issues as below including malignancy as described.  S/p chemotherapy and radiation therapy.  Patient states he was in his usual state of health till about 5 weeks ago when he started noticing intermittent episodes of his stool being maroon/reddish in color.  Patient does not report any diarrhea vomiting skin bruising or nosebleeding or gum bleeding etc.  Patient did not pay my much attention to it.  Patient states that the episodes of red-colored stool actually stopped about a week ago.  However patient reports a new onset of progressive fatigue which is exertional in nature, relieved by rest.  Associated with slight sensation of shortness of breath.  In spite of this patient has been able to maintain a good functional status including climbing up to his second level apartment.    He was evaluated at the oncology clinic and he was found to have severe anemia with a hemoglobin of 4.9.  He was referred to the ED for further management.  Stool for occult blood was positive in the ED.      Assessment/Plan:   Principal Problem:   Symptomatic anemia Active Problems:   Cardiomyopathy, idiopathic (HCC)   CKD (chronic kidney disease) stage 4, GFR 15-29 ml/min (HCC)   Lower GI bleed    Body mass index is 26.27 kg/m.    Acute on chronic anemia, symptomatic: Hemoglobin up from 4.9-6.6.  S/p transfusion with 2 units of PRBCs on 03/13/2024.  Transfuse 1 more unit of PRBCs.  Monitor H&H.   Acute GI bleeding, probable lower GI bleeding: Reports of recent bloody stools.  Stool for occult blood was positive in the ED.  Continue IV Protonix .  Follow-up with gastroenterologist. Discontinue IV fluids   Chronic atrial  fibrillation: Rate controlled.  Hold Eliquis   Hypotension: Hold Bumex and spironolactone   Chronic HFpEF: Hold Bumex for left renal abdomen. 2D echo in February 2025 showed EF estimated at 50 to 55%, moderate to severe tricuspid regurgitation, moderate pulmonary hypertension   Malignant neoplasm of prostate: Per onc: Gleason's 3+4. Patient's PSA increased and peaked at 39.34, but since completing treatment has decreased and his most recent result was 4.65. Nuclear medicine bone scan on December 13, 2022 was reported as negative. PSMA PET per radiation oncology from Mar 21, 2023 did not reveal metastatic disease. Patient completed XRT for local control disease on May 30, 2023.    Personal history of colon cancer: Per onc notes from 01/29/2024   Patient completed cycle 9 of adjuvant FOLFOX on June 03, 2020. Given his difficulties with treatment and declining performance status, treatment was discontinued altogether. Colonoscopy on September 03, 2022 did not reveal any significant pathology and recommendation was to repeat in 3 years.    CT scan from December 07, 2022  with enlarging aortocaval node now measuring 1.9 x 1.4 cm.  PET scan results from Mar 15, 2023  with progressive retroperitoneal lymphadenopathy presumably related to patient's colon cancer.  Patient last received chemotherapy with FOLFIRI on October 15, 2023.  CEA has increased significantly and his most recent result is 396.  PET scan results from December 30, 2023 with progressive disease of hypermetabolic retrocaval mass, but no other  evidence of metastatic disease.  Patient has completed SBRT to this lesion.  Will repeat PET scan at the end of May with follow-up 1 to 2 days later.  No other intervention is needed at this time.       Comorbidities include CKD stage IV, hypertension, hyperlipidemia   Diet Order             Diet clear liquid Room service appropriate? Yes; Fluid consistency: Thin  Diet effective now                             Consultants: Gastroenterologist  Procedures: None    Medications:    sodium chloride    Intravenous Once   allopurinol  100 mg Oral Daily   atorvastatin  80 mg Oral Daily   magnesium  chloride  1 tablet Oral Daily   pantoprazole  (PROTONIX ) IV  40 mg Intravenous Q12H   sodium chloride  flush  3 mL Intravenous Q12H   tamsulosin   0.4 mg Oral QPC supper   Continuous Infusions:  sodium chloride  75 mL/hr at 03/14/24 0948     Anti-infectives (From admission, onward)    None              Family Communication/Anticipated D/C date and plan/Code Status   DVT prophylaxis: SCDs Start: 03/13/24 1853     Code Status: Full Code  Family Communication: None Disposition Plan: Plan to discharge home   Status is: Observation The patient will require care spanning > 2 midnights and should be moved to inpatient because: Severe anemia, GI bleed       Subjective:   Interval events noted.  He complains of fatigue and general weakness.  No shortness of breath or chest pain.  Objective:    Vitals:   03/14/24 0019 03/14/24 0242 03/14/24 0357 03/14/24 0736  BP: (!) 97/58 (!) 91/52 (!) 94/52 91/62  Pulse: (!) 56 (!) 50 (!) 53 (!) 51  Resp: 16 16 16 18   Temp: 97.7 F (36.5 C) 97.7 F (36.5 C) 97.6 F (36.4 C) 98 F (36.7 C)  TempSrc: Oral Oral Oral Oral  SpO2: 100% 100% 100% 100%  Weight:      Height:       No data found.   Intake/Output Summary (Last 24 hours) at 03/14/2024 1053 Last data filed at 03/14/2024 0952 Gross per 24 hour  Intake 1809.97 ml  Output 400 ml  Net 1409.97 ml   Filed Weights   03/13/24 1525 03/13/24 2118  Weight: 80.7 kg 80.7 kg    Exam:  GEN: NAD SKIN: Warm and dry. Lump on the nape of the neck EYES: Pale but anicteric ENT: MMM CV: RRR PULM: CTA B ABD: soft, ND, NT, +BS CNS: AAO x 3, non focal EXT: No edema or tenderness        Data Reviewed:   I have personally reviewed following  labs and imaging studies:  Labs: Labs show the following:   Basic Metabolic Panel: Recent Labs  Lab 03/13/24 1351 03/14/24 0400  NA 132* 136  K 4.1 4.2  CL 99 101  CO2 25 26  GLUCOSE 135* 99  BUN 83* 76*  CREATININE 3.17* 2.90*  CALCIUM  8.9 9.1  MG 2.1  --    GFR Estimated Creatinine Clearance: 24.4 mL/min (A) (by C-G formula based on SCr of 2.9 mg/dL (H)). Liver Function Tests: Recent Labs  Lab 03/13/24 1351  AST 19  ALT 12  ALKPHOS 172*  BILITOT 0.8  PROT 7.5  ALBUMIN 3.5   No results for input(s): "LIPASE", "AMYLASE" in the last 168 hours. No results for input(s): "AMMONIA" in the last 168 hours. Coagulation profile Recent Labs  Lab 03/14/24 0400  INR 1.6*    CBC: Recent Labs  Lab 03/13/24 1351 03/14/24 0400  WBC 2.8* 3.5*  NEUTROABS 2.2  --   HGB 4.9* 6.6*  HCT 15.8* 20.2*  MCV 92.4 88.6  PLT 123* 126*   Cardiac Enzymes: No results for input(s): "CKTOTAL", "CKMB", "CKMBINDEX", "TROPONINI" in the last 168 hours. BNP (last 3 results) No results for input(s): "PROBNP" in the last 8760 hours. CBG: No results for input(s): "GLUCAP" in the last 168 hours. D-Dimer: No results for input(s): "DDIMER" in the last 72 hours. Hgb A1c: No results for input(s): "HGBA1C" in the last 72 hours. Lipid Profile: No results for input(s): "CHOL", "HDL", "LDLCALC", "TRIG", "CHOLHDL", "LDLDIRECT" in the last 72 hours. Thyroid  function studies: No results for input(s): "TSH", "T4TOTAL", "T3FREE", "THYROIDAB" in the last 72 hours.  Invalid input(s): "FREET3" Anemia work up: No results for input(s): "VITAMINB12", "FOLATE", "FERRITIN", "TIBC", "IRON", "RETICCTPCT" in the last 72 hours. Sepsis Labs: Recent Labs  Lab 03/13/24 1351 03/14/24 0400  WBC 2.8* 3.5*    Microbiology No results found for this or any previous visit (from the past 240 hours).  Procedures and diagnostic studies:  DG Chest Port 1 View Result Date: 03/13/2024 CLINICAL DATA:  Patient sent  from the cancer center for weakness and low hemoglobin. EXAM: PORTABLE CHEST 1 VIEW COMPARISON:  October 23, 2023 FINDINGS: There is stable left-sided venous Port-A-Cath positioning. The cardiac silhouette is enlarged and unchanged in size. There is no evidence of an acute infiltrate, pleural effusion or pneumothorax. The visualized skeletal structures are unremarkable. IMPRESSION: Stable cardiomegaly without evidence of acute or active cardiopulmonary disease. Electronically Signed   By: Virgle Grime M.D.   On: 03/13/2024 21:51               LOS: 0 days   Ellean Firman  [facility]   Pager on [email or web address]. If 7PM-7AM, please contact night-coverage at [email or web address]     03/14/2024, 10:53 AM

## 2024-03-14 NOTE — Consult Note (Signed)
 Consultation  Referring Provider:  Hospitalist    Admit date: 03/13/2024 Consult date: 03/14/2024         Reason for Consultation: Anemia/Rectal bleeding              HPI:   Chris George is a 68 y.o. gentleman with history of multiple myeloma, colon cancer s/p right hemicolectomy (last colonoscopy in 2023 was unremarkable) with retrocaval met that was treated with radiation recently, prostate cancer treated with radiation (last session to the prostate was in July of last year), CKD, and a. Fib on eliquis with last dose yesterday who presents with intermittent rectal bleeding and anemia to 5. He notes intermittent rectal bleeding for weeks and describes it as purple in color. No NSAID use. No abdominal pain.  Past Medical History:  Diagnosis Date   A-fib (HCC)    Anemia    Arthritis    ankles   Cancer (HCC)    multiple myeloma   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HCC) 10/26/2019   Cardiomyopathy (HCC)    Chemotherapy-induced peripheral neuropathy (HCC)    CHF (congestive heart failure) (HCC)    Depression    Dysrhythmia    atrial fib   History of CVA (cerebrovascular accident)    HLD (hyperlipidemia)    Hypercholesteremia    Hypertension    Moderate tricuspid insufficiency    Multiple myeloma (HCC)    not being treated right now per pt 11/11/19   Pneumonia    Sleep apnea    No CPAP.  OSA resolved with wt loss.    Past Surgical History:  Procedure Laterality Date   ATRIAL ABLATION SURGERY     CARDIAC SURGERY     A-Fib Ablations   COLONOSCOPY WITH PROPOFOL  N/A 10/26/2019   Procedure: COLONOSCOPY WITH BIOPSY;  Surgeon: Marnee Sink, MD;  Location: [facility];  Service: Endoscopy;  Laterality: N/A;   COLONOSCOPY WITH PROPOFOL  N/A 09/03/2022   Procedure: COLONOSCOPY WITH PROPOFOL ;  Surgeon: Marnee Sink, MD;  Location: [facility];  Service: Endoscopy;  Laterality: N/A;   ESOPHAGOGASTRODUODENOSCOPY (EGD) WITH PROPOFOL  N/A 10/26/2019   Procedure:  ESOPHAGOGASTRODUODENOSCOPY (EGD) WITH BIOPSY;  Surgeon: Marnee Sink, MD;  Location: [facility];  Service: Endoscopy;  Laterality: N/A;  sleep apnea   LAPAROSCOPIC PARTIAL COLECTOMY Right 11/17/2019   Procedure: LAPAROSCOPIC PARTIAL COLECTOMY RIGHT EXTENDED;  Surgeon: Emmalene Hare, MD;  Location: [facility];  Service: General;  Laterality: Right;   PORTACATH PLACEMENT N/A 12/28/2019   Procedure: INSERTION PORT-A-CATH Left subclavian;  Surgeon: Emmalene Hare, MD;  Location: [facility];  Service: General;  Laterality: N/A;    Family History  Problem Relation Age of Onset   Healthy Mother     Social History   Tobacco Use   Smoking status: Never    Passive exposure: Never   Smokeless tobacco: Never  Vaping Use   Vaping status: Never Used  Substance Use Topics   Alcohol use: Not Currently   Drug use: Never    Prior to Admission medications   Medication Sig Start Date End Date Taking? Authorizing Provider  allopurinol (ZYLOPRIM) 100 MG tablet Take 200 mg by mouth daily. 11/01/22  Yes [provider]  apixaban (ELIQUIS) 5 MG TABS tablet Take 5 mg by mouth 2 (two) times daily.   Yes [provider]  atorvastatin (LIPITOR) 80 MG tablet Take 80 mg by mouth daily.   Yes [provider]  bumetanide (BUMEX) 2 MG tablet Take 2 mg by mouth 2 (two) times  daily. 12/17/23 12/11/24 Yes [provider]  colchicine  0.6 MG tablet Take 1 tablet (0.6 mg total) by mouth 2 (two) times daily. Until flare resolves. 04/22/23  Yes Nancy Axon B, PA-C  lidocaine -prilocaine  (EMLA ) cream Apply 1 Application topically daily.   Yes [provider]  magnesium  chloride (SLOW-MAG) 64 MG TBEC SR tablet Take 1 tablet (64 mg total) by mouth daily. 11/08/23  Yes Borders, Carlene Che, NP  spironolactone (ALDACTONE) 25 MG tablet Take 1 tablet by mouth every morning. 12/17/23 12/11/24 Yes [provider]  tamsulosin  (FLOMAX ) 0.4 MG CAPS capsule Take 1 capsule (0.4 mg total) by  mouth daily after supper. 02/04/24  Yes Chrystal, Allison Ivory, MD  APPLE CIDER VINEGAR PO Take 30-45 mLs by mouth every other day.  Patient not taking: Reported on 01/01/2024    [provider]  traMADol  (ULTRAM ) 50 MG tablet Take 0.5-1 tablets (25-50 mg total) by mouth every 6 (six) hours as needed for severe pain (pain score 7-10). Patient not taking: Reported on 12/27/2023 12/24/23   Lawerence Pressman, MD  Zinc Sulfate (ZINC 15 PO) Take by mouth. Patient not taking: Reported on 12/27/2023    [provider]    Current Facility-Administered Medications  Medication Dose Route Frequency Provider Last Rate Last Admin   acetaminophen  (TYLENOL ) tablet 650 mg  650 mg Oral Q6H PRN Bennie Brave, MD       Or   acetaminophen  (TYLENOL ) suppository 650 mg  650 mg Rectal Q6H PRN Bennie Brave, MD       allopurinol (ZYLOPRIM) tablet 100 mg  100 mg Oral Daily Goel, Hersh, MD   100 mg at 03/14/24 0950   atorvastatin (LIPITOR) tablet 80 mg  80 mg Oral Daily Goel, Hersh, MD   80 mg at 03/13/24 2239   magnesium  chloride (SLOW-MAG) 64 MG SR tablet 64 mg  1 tablet Oral Daily Goel, Hersh, MD       pantoprazole  (PROTONIX ) injection 40 mg  40 mg Intravenous Q12H Goel, Hersh, MD   40 mg at 03/14/24 0952   polyethylene glycol (MIRALAX  / GLYCOLAX ) packet 17 g  17 g Oral Daily PRN Bennie Brave, MD       sodium chloride  flush (NS) 0.9 % injection 3 mL  3 mL Intravenous Q12H Bennie Brave, MD   3 mL at 03/14/24 4098   tamsulosin  (FLOMAX ) capsule 0.4 mg  0.4 mg Oral QPC supper Bennie Brave, MD       Facility-Administered Medications Ordered in Other Encounters  Medication Dose Route Frequency Provider Last Rate Last Admin   heparin  lock flush 100 unit/mL  500 Units Intravenous Once Finnegan, Timothy J, MD        Allergies as of 03/13/2024   (No Known Allergies)     Review of Systems:    All systems reviewed and negative except where noted in HPI.  Review of Systems  Constitutional:  Positive for  malaise/fatigue. Negative for chills and fever.  Respiratory:  Negative for shortness of breath.   Cardiovascular:  Negative for chest pain.  Gastrointestinal:  Positive for blood in stool. Negative for heartburn, nausea and vomiting.  Musculoskeletal:  Positive for joint pain.  Skin:  Negative for rash.  Neurological:  Negative for focal weakness.  Psychiatric/Behavioral:  Negative for substance abuse.   All other systems reviewed and are negative.     Physical Exam:  Vital signs in last 24 hours: Temp:  [97.5 F (36.4 C)-98 F (36.7 C)] 97.7 F (36.5 C) (05/10  1245) Pulse Rate:  [50-91] 57 (05/10 1245) Resp:  [12-22] 18 (05/10 1245) BP: (88-110)/(40-69) 91/55 (05/10 1245) SpO2:  [99 %-100 %] 100 % (05/10 1245) Weight:  [80.7 kg] 80.7 kg (05/10 0733) Last BM Date : 03/13/24 General:   Pleasant in NAD Head:  Normocephalic and atraumatic. Eyes:   No icterus.   Conjunctiva pink. Mouth: Mucosa pink moist, no lesions. Neck:  Supple; no masses felt Lungs:  No respiratory distress Abdomen:   Flat, soft, nondistended, non-tender Msk:  MAEW x4, No clubbing or cyanosis. Strength 5/5. Symmetrical without gross deformities. Neurologic:  Alert and  oriented x4;  No focal deficits Skin:  Warm, dry, pink without significant lesions or rashes. Psych:  Alert and cooperative. Normal affect.  LAB RESULTS: Recent Labs    03/13/24 1351 03/14/24 0400  WBC 2.8* 3.5*  HGB 4.9* 6.6*  HCT 15.8* 20.2*  PLT 123* 126*   BMET Recent Labs    03/13/24 1351 03/14/24 0400  NA 132* 136  K 4.1 4.2  CL 99 101  CO2 25 26  GLUCOSE 135* 99  BUN 83* 76*  CREATININE 3.17* 2.90*  CALCIUM  8.9 9.1   LFT Recent Labs    03/13/24 1351  PROT 7.5  ALBUMIN 3.5  AST 19  ALT 12  ALKPHOS 172*  BILITOT 0.8   PT/INR Recent Labs    03/14/24 0400  LABPROT 19.5*  INR 1.6*    STUDIES: DG Chest Port 1 View Result Date: 03/13/2024 CLINICAL DATA:  Patient sent from the cancer center for weakness  and low hemoglobin. EXAM: PORTABLE CHEST 1 VIEW COMPARISON:  October 23, 2023 FINDINGS: There is stable left-sided venous Port-A-Cath positioning. The cardiac silhouette is enlarged and unchanged in size. There is no evidence of an acute infiltrate, pleural effusion or pneumothorax. The visualized skeletal structures are unremarkable. IMPRESSION: Stable cardiomegaly without evidence of acute or active cardiopulmonary disease. Electronically Signed   By: Virgle Grime M.D.   On: 03/13/2024 21:51       Impression / Plan:   68 y.o. gentleman with history of multiple myeloma, colon cancer s/p right hemicolectomy (last colonoscopy in 2023 was unremarkable) with retrocaval met that was treated with radiation recently, prostate cancer treated with radiation (last session to the prostate was in July of last year), CKD, and a. Fib on eliquis with last dose yesterday who presents with intermittent rectal bleeding and anemia to 5. 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 Clears on Monday - given DOAC use and CKD, likely plan on EGD/Colon on Tuesday - maintain active type and screen - transfuse for hemoglobin < 7 - can continue PPI for now - monitor for any severe hemodynamically significant GI bleeding  Will continue to follow, please call with any questions or concerns.  Olivia Bevel MD, MPH [facility]

## 2024-03-14 NOTE — Care Management Obs Status (Signed)
 MEDICARE OBSERVATION STATUS NOTIFICATION   Patient Details  Name: Chris George MRN: [id number] Date of Birth: 05-25-1956   Medicare Observation Status Notification Given:  Rudolph Cost, CMA 03/14/2024, 11:49 AM

## 2024-03-14 NOTE — Progress Notes (Signed)
 Admission note: patient arrived to the floor via stretcher.  Remained free from any noted signs of acute distress.  Able to transfer from stretcher to bed with minimal difficulty.  RN able to perform general assessment.  Skin assessed.  Remains free from any noted signs of altered skin integrity.  Lung fields remain clear throughout.  Patient noted to have left subclavian port - a - cath, accessed.  Upon reviewing current orders, patient to receive an 2 of 2 ordered PRBC''s.  CVAD device noted to flush without difficulty, with noted (+) blood return.  Packed red blood cells initiated.  Patient remained free from any noted signs of adverse effects from blood products.  Patient noted to tolerate completed infusion.  Currently receiving IVF of NS @ 75 ml/hr, per order.  Patient noted to tolerate IV therapy without difficulty.  Patient to continue to be monitored by hospital staff until discharged.

## 2024-03-15 DIAGNOSIS — D62 Acute posthemorrhagic anemia: Secondary | ICD-10-CM | POA: Diagnosis present

## 2024-03-15 DIAGNOSIS — D649 Anemia, unspecified: Secondary | ICD-10-CM | POA: Diagnosis not present

## 2024-03-15 DIAGNOSIS — N184 Chronic kidney disease, stage 4 (severe): Secondary | ICD-10-CM | POA: Diagnosis present

## 2024-03-15 DIAGNOSIS — G4733 Obstructive sleep apnea (adult) (pediatric): Secondary | ICD-10-CM | POA: Diagnosis present

## 2024-03-15 DIAGNOSIS — R590 Localized enlarged lymph nodes: Secondary | ICD-10-CM | POA: Diagnosis present

## 2024-03-15 DIAGNOSIS — F32A Depression, unspecified: Secondary | ICD-10-CM | POA: Diagnosis present

## 2024-03-15 DIAGNOSIS — D61818 Other pancytopenia: Secondary | ICD-10-CM | POA: Diagnosis present

## 2024-03-15 DIAGNOSIS — I428 Other cardiomyopathies: Secondary | ICD-10-CM | POA: Diagnosis present

## 2024-03-15 DIAGNOSIS — R5383 Other fatigue: Secondary | ICD-10-CM | POA: Diagnosis present

## 2024-03-15 DIAGNOSIS — I482 Chronic atrial fibrillation, unspecified: Secondary | ICD-10-CM | POA: Diagnosis present

## 2024-03-15 DIAGNOSIS — N179 Acute kidney failure, unspecified: Secondary | ICD-10-CM | POA: Diagnosis present

## 2024-03-15 DIAGNOSIS — I272 Pulmonary hypertension, unspecified: Secondary | ICD-10-CM | POA: Diagnosis present

## 2024-03-15 DIAGNOSIS — R350 Frequency of micturition: Secondary | ICD-10-CM | POA: Diagnosis present

## 2024-03-15 DIAGNOSIS — I130 Hypertensive heart and chronic kidney disease with heart failure and stage 1 through stage 4 chronic kidney disease, or unspecified chronic kidney disease: Secondary | ICD-10-CM | POA: Diagnosis present

## 2024-03-15 DIAGNOSIS — C9000 Multiple myeloma not having achieved remission: Secondary | ICD-10-CM | POA: Diagnosis present

## 2024-03-15 DIAGNOSIS — Z923 Personal history of irradiation: Secondary | ICD-10-CM | POA: Diagnosis not present

## 2024-03-15 DIAGNOSIS — I071 Rheumatic tricuspid insufficiency: Secondary | ICD-10-CM | POA: Diagnosis present

## 2024-03-15 DIAGNOSIS — K5521 Angiodysplasia of colon with hemorrhage: Secondary | ICD-10-CM | POA: Diagnosis present

## 2024-03-15 DIAGNOSIS — K641 Second degree hemorrhoids: Secondary | ICD-10-CM | POA: Diagnosis present

## 2024-03-15 DIAGNOSIS — Z6841 Body Mass Index (BMI) 40.0 and over, adult: Secondary | ICD-10-CM | POA: Diagnosis not present

## 2024-03-15 DIAGNOSIS — I5032 Chronic diastolic (congestive) heart failure: Secondary | ICD-10-CM | POA: Diagnosis present

## 2024-03-15 DIAGNOSIS — Z7901 Long term (current) use of anticoagulants: Secondary | ICD-10-CM | POA: Diagnosis not present

## 2024-03-15 DIAGNOSIS — C61 Malignant neoplasm of prostate: Secondary | ICD-10-CM | POA: Diagnosis present

## 2024-03-15 DIAGNOSIS — Z9221 Personal history of antineoplastic chemotherapy: Secondary | ICD-10-CM | POA: Diagnosis not present

## 2024-03-15 DIAGNOSIS — E7800 Pure hypercholesterolemia, unspecified: Secondary | ICD-10-CM | POA: Diagnosis present

## 2024-03-15 DIAGNOSIS — I959 Hypotension, unspecified: Secondary | ICD-10-CM | POA: Diagnosis not present

## 2024-03-15 LAB — CBC
HCT: 21 % — ABNORMAL LOW (ref 39.0–52.0)
Hemoglobin: 6.8 g/dL — ABNORMAL LOW (ref 13.0–17.0)
MCH: 28.6 pg (ref 26.0–34.0)
MCHC: 32.4 g/dL (ref 30.0–36.0)
MCV: 88.2 fL (ref 80.0–100.0)
Platelets: 125 10*3/uL — ABNORMAL LOW (ref 150–400)
RBC: 2.38 MIL/uL — ABNORMAL LOW (ref 4.22–5.81)
RDW: 18.6 % — ABNORMAL HIGH (ref 11.5–15.5)
WBC: 3.1 10*3/uL — ABNORMAL LOW (ref 4.0–10.5)
nRBC: 0 % (ref 0.0–0.2)

## 2024-03-15 LAB — BASIC METABOLIC PANEL WITH GFR
Anion gap: 9 (ref 5–15)
BUN: 72 mg/dL — ABNORMAL HIGH (ref 8–23)
CO2: 24 mmol/L (ref 22–32)
Calcium: 8.7 mg/dL — ABNORMAL LOW (ref 8.9–10.3)
Chloride: 102 mmol/L (ref 98–111)
Creatinine, Ser: 2.82 mg/dL — ABNORMAL HIGH (ref 0.61–1.24)
GFR, Estimated: 24 mL/min — ABNORMAL LOW (ref >60)
Glucose, Bld: 96 mg/dL (ref 70–99)
Potassium: 4.5 mmol/L (ref 3.5–5.1)
Sodium: 135 mmol/L (ref 135–145)

## 2024-03-15 LAB — PREPARE RBC (CROSSMATCH)

## 2024-03-15 MED ORDER — PSYLLIUM 95 % PO PACK
1.0000 | PACK | Freq: Every day | ORAL | Status: DC
  Administered 2024-03-15 – 2024-03-16 (×2): 1 via ORAL
  Filled 2024-03-15 (×3): qty 1

## 2024-03-15 MED ORDER — SODIUM CHLORIDE 0.9% IV SOLUTION: Freq: Once | INTRAVENOUS | Status: AC

## 2024-03-15 MED ORDER — PEG 3350-KCL-NA BICARB-NACL 420 G PO SOLR
4000.0000 mL | Freq: Once | ORAL | Status: AC
  Administered 2024-03-16: 4000 mL via ORAL
  Filled 2024-03-15: qty 4000

## 2024-03-15 NOTE — Progress Notes (Signed)
 Progress Note    Chris George  ZOX:[id number] DOB: 1956/08/30  DOA: 03/13/2024 PCP: Lorrie Rothman, MD      Brief Narrative:    Medical records reviewed and are as summarized below:  Chris George is a 68 y.o. male  with medical history significant of medical issues as below including malignancy as described.  S/p chemotherapy and radiation therapy.  Patient states he was in his usual state of health till about 5 weeks ago when he started noticing intermittent episodes of his stool being maroon/reddish in color.  Patient does not report any diarrhea vomiting skin bruising or nosebleeding or gum bleeding etc.  Patient did not pay my much attention to it.  Patient states that the episodes of red-colored stool actually stopped about a week ago.  However patient reports a new onset of progressive fatigue which is exertional in nature, relieved by rest.  Associated with slight sensation of shortness of breath.  In spite of this patient has been able to maintain a good functional status including climbing up to his second level apartment.    He was evaluated at the oncology clinic and he was found to have severe anemia with a hemoglobin of 4.9.  He was referred to the ED for further management.  Stool for occult blood was positive in the ED.      Assessment/Plan:   Principal Problem:   Symptomatic anemia Active Problems:   Cardiomyopathy, idiopathic (HCC)   CKD (chronic kidney disease) stage 4, GFR 15-29 ml/min (HCC)   Lower GI bleed    Body mass index is 62.67 kg/m.    Acute on chronic anemia, symptomatic: Hemoglobin up from 4.9-6.6.  S/p transfusion with 2 units of PRBCs on 03/13/2024.  S/p transfusion 1 unit of PRBCs on 03/14/2024. Hemoglobin trend 4.9-6.6-6.8. Transfuse another unit of PRBCs for hemoglobin of 6.8.     Acute GI bleeding, probable lower GI bleeding: Reports of recent bloody stools.  Stool for occult blood was positive in the ED.  Continue IV Protonix .   Plan for endoscopic workup on Tuesday, 03/16/2024.   Chronic atrial fibrillation: Rate controlled.  Hold Eliquis   Hypotension: BP down to 88/58 2 today.  Bumex and spironolactone are still on hold.    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 Patient's PSA increased and peaked at 39.34, but since completing treatment has decreased and his most recent result was 4.65. Nuclear medicine bone scan on December 13, 2022 was reported as negative. PSMA PET per radiation oncology from Mar 21, 2023 did not reveal metastatic disease. Patient completed XRT for local control disease on May 30, 2023.    Personal history of colon cancer: Per onc notes from 01/29/2024   Patient completed cycle 9 of adjuvant FOLFOX on June 03, 2020. Given his difficulties with treatment and declining performance status, treatment was discontinued altogether. Colonoscopy on September 03, 2022 did not reveal any significant pathology and recommendation was to repeat in 3 years.    CT scan from December 07, 2022  with enlarging aortocaval node now measuring 1.9 x 1.4 cm.  PET scan results from Mar 15, 2023  with progressive retroperitoneal lymphadenopathy presumably related to patient's colon cancer.  Patient last received chemotherapy with FOLFIRI on October 15, 2023.  CEA has increased significantly and  his most recent result is 396.  PET scan results from December 30, 2023 with progressive disease of hypermetabolic retrocaval mass, but no other evidence of metastatic disease.  Patient has completed SBRT to this lesion.  Will repeat PET scan at the end of May with follow-up 1 to 2 days later.  No other intervention is needed at this time.       Comorbidities include CKD stage IV, hypertension, hyperlipidemia   Diet Order              Diet clear liquid Room service appropriate? Yes; Fluid consistency: Thin  Diet effective 0500 tomorrow           Diet Heart Fluid consistency: Thin  Diet effective now                            Consultants: Gastroenterologist  Procedures: None    Medications:    allopurinol  100 mg Oral Daily   atorvastatin  80 mg Oral Daily   magnesium  chloride  1 tablet Oral Daily   pantoprazole  (PROTONIX ) IV  40 mg Intravenous Q12H   [START ON 03/16/2024] polyethylene glycol-electrolytes  4,000 mL Oral Once   psyllium  1 packet Oral Daily   sodium chloride  flush  3 mL Intravenous Q12H   tamsulosin   0.4 mg Oral QPC supper   Continuous Infusions:     Anti-infectives (From admission, onward)    None              Family Communication/Anticipated D/C date and plan/Code Status   DVT prophylaxis: SCDs Start: 03/13/24 1853     Code Status: Full Code  Family Communication: None Disposition Plan: Plan to discharge home   Status is: Observation The patient will require care spanning > 2 midnights and should be moved to inpatient because: Severe anemia, GI bleed       Subjective:   Interval events noted.  No active bleeding.  Last bowel movement was yesterday and stools were brownish.  No dizziness.  Issues are little weak.  Objective:    Vitals:   03/14/24 1944 03/15/24 0512 03/15/24 0759 03/15/24 0910  BP: 91/63 99/60 (!) 86/50 (!) 88/52  Pulse: 89 (!) 55 (!) 51 (!) 48  Resp: 18 16 16 18   Temp: 97.8 F (36.6 C) 98.2 F (36.8 C) 98.3 F (36.8 C) 98.3 F (36.8 C)  TempSrc: Oral Oral Oral Oral  SpO2: 100% 99% 98%   Weight:  (!) 192.5 kg    Height:       No data found.   Intake/Output Summary (Last 24 hours) at 03/15/2024 1038 Last data filed at 03/15/2024 3474 Gross per 24 hour  Intake 340 ml  Output 700 ml  Net -360 ml   Filed Weights   03/13/24 2118 03/14/24 0733 03/15/24 0512  Weight: 80.7 kg 80.7 kg (!) 192.5 kg    Exam:  GEN:  NAD SKIN: Warm and dry EYES: Pale.  Anicteric ENT: MMM CV: RRR PULM: CTA B ABD: soft, ND, NT, +BS CNS: AAO x 3, non focal EXT: No edema or tenderness       Data Reviewed:   I have personally reviewed following labs and imaging studies:  Labs: Labs show the following:   Basic Metabolic Panel: Recent Labs  Lab 03/13/24 1351 03/14/24 0400 03/15/24 0452  NA 132* 136 135  K 4.1 4.2 4.5  CL 99 101 102  CO2 25 26 24   GLUCOSE  135* 99 96  BUN 83* 76* 72*  CREATININE 3.17* 2.90* 2.82*  CALCIUM  8.9 9.1 8.7*  MG 2.1  --   --    GFR Estimated Creatinine Clearance: 42.3 mL/min (A) (by C-G formula based on SCr of 2.82 mg/dL (H)). Liver Function Tests: Recent Labs  Lab 03/13/24 1351  AST 19  ALT 12  ALKPHOS 172*  BILITOT 0.8  PROT 7.5  ALBUMIN 3.5   No results for input(s): "LIPASE", "AMYLASE" in the last 168 hours. No results for input(s): "AMMONIA" in the last 168 hours. Coagulation profile Recent Labs  Lab 03/14/24 0400  INR 1.6*    CBC: Recent Labs  Lab 03/13/24 1351 03/14/24 0400 03/15/24 0452  WBC 2.8* 3.5* 3.1*  NEUTROABS 2.2  --   --   HGB 4.9* 6.6* 6.8*  HCT 15.8* 20.2* 21.0*  MCV 92.4 88.6 88.2  PLT 123* 126* 125*   Cardiac Enzymes: No results for input(s): "CKTOTAL", "CKMB", "CKMBINDEX", "TROPONINI" in the last 168 hours. BNP (last 3 results) No results for input(s): "PROBNP" in the last 8760 hours. CBG: No results for input(s): "GLUCAP" in the last 168 hours. D-Dimer: No results for input(s): "DDIMER" in the last 72 hours. Hgb A1c: No results for input(s): "HGBA1C" in the last 72 hours. Lipid Profile: No results for input(s): "CHOL", "HDL", "LDLCALC", "TRIG", "CHOLHDL", "LDLDIRECT" in the last 72 hours. Thyroid  function studies: No results for input(s): "TSH", "T4TOTAL", "T3FREE", "THYROIDAB" in the last 72 hours.  Invalid input(s): "FREET3" Anemia work up: No results for input(s): "VITAMINB12", "FOLATE", "FERRITIN", "TIBC", "IRON",  "RETICCTPCT" in the last 72 hours. Sepsis Labs: Recent Labs  Lab 03/13/24 1351 03/14/24 0400 03/15/24 0452  WBC 2.8* 3.5* 3.1*    Microbiology No results found for this or any previous visit (from the past 240 hours).  Procedures and diagnostic studies:  DG Chest Port 1 View Result Date: 03/13/2024 CLINICAL DATA:  Patient sent from the cancer center for weakness and low hemoglobin. EXAM: PORTABLE CHEST 1 VIEW COMPARISON:  October 23, 2023 FINDINGS: There is stable left-sided venous Port-A-Cath positioning. The cardiac silhouette is enlarged and unchanged in size. There is no evidence of an acute infiltrate, pleural effusion or pneumothorax. The visualized skeletal structures are unremarkable. IMPRESSION: Stable cardiomegaly without evidence of acute or active cardiopulmonary disease. Electronically Signed   By: Virgle Grime M.D.   On: 03/13/2024 21:51               LOS: 0 days   Shiryl Ruddy  [facility]   Pager on [email or web address]. If 7PM-7AM, please contact night-coverage at [email or web address]     03/15/2024, 10:38 AM

## 2024-03-15 NOTE — Progress Notes (Signed)
   03/15/24 0910  Assess: MEWS Score  Temp 98.3 F (36.8 C)  BP (!) 88/52  Pulse Rate (!) 48  Resp 18  Assess: MEWS Score  MEWS Temp 0  MEWS Systolic 1  MEWS Pulse 1  MEWS RR 0  MEWS LOC 0  MEWS Score 2  MEWS Score Color Yellow  Assess: if the MEWS score is Yellow or Red  Were vital signs accurate and taken at a resting state? Yes  Does the patient meet 2 or more of the SIRS criteria? Yes  MEWS guidelines implemented  Yes, yellow  Treat  MEWS Interventions Considered administering scheduled or prn medications/treatments as ordered  Take Vital Signs  Increase Vital Sign Frequency  Yellow: Q2hr x1, continue Q4hrs until patient remains green for 12hrs  Escalate  MEWS: Escalate Yellow: Discuss with charge nurse and consider notifying provider and/or RRT  Notify: Charge Nurse/RN  Name of Charge Nurse/RN Notified Robyn  Provider Notification  Provider Name/Title Dr. Vince Grebe  Date Provider Notified 03/15/24  Time Provider Notified [phone]  Method of Notification  (secure chat)  Notification Reason Other (Comment) (MEWS)  Assess: SIRS CRITERIA  SIRS Temperature  0  SIRS Respirations  0  SIRS Pulse 0  SIRS WBC 1  SIRS Score Sum  1

## 2024-03-15 NOTE — Care Plan (Signed)
 Plan for EGD/Colonoscopy on Tuesday to let DOAC wash out. Transfuse to keep hemoglobin > 7. No significant recurrent GI bleeding. Dr. Ole Berkeley will take over care tomorrow.  Olivia Bevel MD, MPH [facility]

## 2024-03-16 DIAGNOSIS — D649 Anemia, unspecified: Secondary | ICD-10-CM | POA: Diagnosis not present

## 2024-03-16 LAB — CBC
HCT: 21.8 % — ABNORMAL LOW (ref 39.0–52.0)
Hemoglobin: 7 g/dL — ABNORMAL LOW (ref 13.0–17.0)
MCH: 28.2 pg (ref 26.0–34.0)
MCHC: 32.1 g/dL (ref 30.0–36.0)
MCV: 87.9 fL (ref 80.0–100.0)
Platelets: 112 10*3/uL — ABNORMAL LOW (ref 150–400)
RBC: 2.48 MIL/uL — ABNORMAL LOW (ref 4.22–5.81)
RDW: 17.9 % — ABNORMAL HIGH (ref 11.5–15.5)
WBC: 3.3 10*3/uL — ABNORMAL LOW (ref 4.0–10.5)
nRBC: 0 % (ref 0.0–0.2)

## 2024-03-16 LAB — PREPARE RBC (CROSSMATCH)

## 2024-03-16 MED ORDER — SODIUM CHLORIDE 0.9% IV SOLUTION: Freq: Once | INTRAVENOUS | Status: AC

## 2024-03-16 NOTE — Progress Notes (Addendum)
 Progress Note    Chris George  UJW:[id number] DOB: 1956/06/29  DOA: 03/13/2024 PCP: Lorrie Rothman, MD      Brief Narrative:    Medical records reviewed and are as summarized below:  Chris George is a 68 y.o. male  with medical history significant of medical issues as below including malignancy as described.  S/p chemotherapy and radiation therapy.  Patient states he was in his usual state of health till about 5 weeks ago when he started noticing intermittent episodes of his stool being maroon/reddish in color.  Patient does not report any diarrhea vomiting skin bruising or nosebleeding or gum bleeding etc.  Patient did not pay my much attention to it.  Patient states that the episodes of red-colored stool actually stopped about a week ago.  However patient reports a new onset of progressive fatigue which is exertional in nature, relieved by rest.  Associated with slight sensation of shortness of breath.  In spite of this patient has been able to maintain a good functional status including climbing up to his second level apartment.    He was evaluated at the oncology clinic and he was found to have severe anemia with a hemoglobin of 4.9.  He was referred to the ED for further management.  Stool for occult blood was positive in the ED.      Assessment/Plan:   Principal Problem:   Symptomatic anemia Active Problems:   Cardiomyopathy, idiopathic (HCC)   CKD (chronic kidney disease) stage 4, GFR 15-29 ml/min (HCC)   Lower GI bleed    Body mass index is 28.78 kg/m.    Acute on chronic anemia, symptomatic: S/p transfusion with 2 units of PRBCs on 03/13/2024.  S/p transfusion 1 unit of PRBCs on 03/14/2024.  Transfused 1 unit of PRBCs on 03/15/2024. Hemoglobin trend 4.9-6.6-6.8-7.0. Hemoglobin up to 7.0.  Given hypotension, GI bleed, fatigue, underlying CHF, malignancy and CKD, patient will be transfused another unit of PRBCs today.   Acute GI bleeding, probable lower GI  bleeding: Reports of bloody stools.  Stool for occult blood was positive in the ED.  Continue IV Protonix .  Plan for EGD and colonoscopy tomorrow.  Follow-up with gastroenterologist.   Chronic atrial fibrillation: Rate controlled.  Eliquis is still on hold.   Hypotension: BP is too low.  BP 98/66 this morning.  He is getting another unit of blood today.  Bumex and spironolactone are still on hold.    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 Patient's PSA increased and peaked at 39.34, but since completing treatment has decreased and his most recent result was 4.65. Nuclear medicine bone scan on December 13, 2022 was reported as negative. PSMA PET per radiation oncology from Mar 21, 2023 did not reveal metastatic disease. Patient completed XRT for local control disease on May 30, 2023.    Personal history of colon cancer: Per onc notes from 01/29/2024   Patient completed cycle 9 of adjuvant FOLFOX on June 03, 2020. Given his difficulties with treatment and declining performance status, treatment was discontinued altogether. Colonoscopy on September 03, 2022 did not reveal any significant pathology and recommendation was to repeat in 3 years.    CT scan from December 07, 2022  with enlarging aortocaval node now measuring 1.9 x 1.4 cm.  PET scan  results from Mar 15, 2023  with progressive retroperitoneal lymphadenopathy presumably related to patient's colon cancer.  Patient last received chemotherapy with FOLFIRI on October 15, 2023.  CEA has increased significantly and his most recent result is 396.  PET scan results from December 30, 2023 with progressive disease of hypermetabolic retrocaval mass, but no other evidence of metastatic disease.  Patient has completed SBRT to this lesion.  Will repeat PET scan  at the end of May with follow-up 1 to 2 days later.  No other intervention is needed at this time.       Comorbidities include CKD stage IV, hypertension, hyperlipidemia   Diet Order             Diet clear liquid Room service appropriate? Yes; Fluid consistency: Thin  Diet effective 0500 tomorrow                            Consultants: Gastroenterologist  Procedures: None    Medications:    sodium chloride    Intravenous Once   allopurinol  100 mg Oral Daily   atorvastatin  80 mg Oral Daily   magnesium  chloride  1 tablet Oral Daily   pantoprazole  (PROTONIX ) IV  40 mg Intravenous Q12H   polyethylene glycol-electrolytes  4,000 mL Oral Once   psyllium  1 packet Oral Daily   sodium chloride  flush  3 mL Intravenous Q12H   tamsulosin   0.4 mg Oral QPC supper   Continuous Infusions:     Anti-infectives (From admission, onward)    None              Family Communication/Anticipated D/C date and plan/Code Status   DVT prophylaxis: SCDs Start: 03/13/24 1853     Code Status: Full Code  Family Communication: None Disposition Plan: Plan to discharge home   Status is: Inpatient Remains inpatient appropriate because: Severe anemia, GI bleed       Subjective:   Interval events noted.  He reports he had 1 episode of bloody stools yesterday.  He feels tired.  No other complaints.  Objective:    Vitals:   03/15/24 1537 03/15/24 1933 03/16/24 0528 03/16/24 0726  BP: 101/62 98/67 (!) 89/62 98/66  Pulse: (!) 58 (!) 54 79 83  Resp: 18 16 16    Temp: 97.7 F (36.5 C) 98 F (36.7 C) 97.8 F (36.6 C) 98 F (36.7 C)  TempSrc:  Oral  Oral  SpO2: 100% 100% 99% 100%  Weight:   88.4 kg   Height:       No data found.   Intake/Output Summary (Last 24 hours) at 03/16/2024 1120 Last data filed at 03/16/2024 1054 Gross per 24 hour  Intake 805 ml  Output 800 ml  Net 5 ml   Filed Weights   03/14/24 0733 03/15/24 0512 03/16/24 0528  Weight:  80.7 kg (!) 192.5 kg 88.4 kg    Exam:  GEN: NAD SKIN: Warm and dry EYES: Pale but anicteric ENT: MMM CV: RRR PULM: CTA B ABD: soft, ND, NT, +BS CNS: AAO x 3, non focal EXT: No edema or tenderness      Data Reviewed:   I have personally reviewed following labs and imaging studies:  Labs: Labs show the following:   Basic Metabolic Panel: Recent Labs  Lab 03/13/24 1351 03/14/24 0400 03/15/24 0452  NA 132* 136 135  K 4.1 4.2 4.5  CL 99 101 102  CO2 25 26 24   GLUCOSE  135* 99 96  BUN 83* 76* 72*  CREATININE 3.17* 2.90* 2.82*  CALCIUM  8.9 9.1 8.7*  MG 2.1  --   --    GFR Estimated Creatinine Clearance: 27.6 mL/min (A) (by C-G formula based on SCr of 2.82 mg/dL (H)). Liver Function Tests: Recent Labs  Lab 03/13/24 1351  AST 19  ALT 12  ALKPHOS 172*  BILITOT 0.8  PROT 7.5  ALBUMIN 3.5   No results for input(s): "LIPASE", "AMYLASE" in the last 168 hours. No results for input(s): "AMMONIA" in the last 168 hours. Coagulation profile Recent Labs  Lab 03/14/24 0400  INR 1.6*    CBC: Recent Labs  Lab 03/13/24 1351 03/14/24 0400 03/15/24 0452 03/16/24 0804  WBC 2.8* 3.5* 3.1* 3.3*  NEUTROABS 2.2  --   --   --   HGB 4.9* 6.6* 6.8* 7.0*  HCT 15.8* 20.2* 21.0* 21.8*  MCV 92.4 88.6 88.2 87.9  PLT 123* 126* 125* 112*   Cardiac Enzymes: No results for input(s): "CKTOTAL", "CKMB", "CKMBINDEX", "TROPONINI" in the last 168 hours. BNP (last 3 results) No results for input(s): "PROBNP" in the last 8760 hours. CBG: No results for input(s): "GLUCAP" in the last 168 hours. D-Dimer: No results for input(s): "DDIMER" in the last 72 hours. Hgb A1c: No results for input(s): "HGBA1C" in the last 72 hours. Lipid Profile: No results for input(s): "CHOL", "HDL", "LDLCALC", "TRIG", "CHOLHDL", "LDLDIRECT" in the last 72 hours. Thyroid  function studies: No results for input(s): "TSH", "T4TOTAL", "T3FREE", "THYROIDAB" in the last 72 hours.  Invalid input(s):  "FREET3" Anemia work up: No results for input(s): "VITAMINB12", "FOLATE", "FERRITIN", "TIBC", "IRON", "RETICCTPCT" in the last 72 hours. Sepsis Labs: Recent Labs  Lab 03/13/24 1351 03/14/24 0400 03/15/24 0452 03/16/24 0804  WBC 2.8* 3.5* 3.1* 3.3*    Microbiology No results found for this or any previous visit (from the past 240 hours).  Procedures and diagnostic studies:  No results found.              LOS: 1 day   Lawson Isabell  [facility]   Pager on [email or web address]. If 7PM-7AM, please contact night-coverage at [email or web address]     03/16/2024, 11:20 AM

## 2024-03-16 NOTE — Plan of Care (Signed)

## 2024-03-17 ENCOUNTER — Inpatient Hospital Stay

## 2024-03-17 ENCOUNTER — Encounter
Admission: EM | Disposition: A | Discharge status: Home / Self Care | Payer: Self-pay | Source: Ambulatory Visit | Attending: Internal Medicine

## 2024-03-17 ENCOUNTER — Encounter: Payer: Self-pay | Admitting: Internal Medicine

## 2024-03-17 DIAGNOSIS — K641 Second degree hemorrhoids: Secondary | ICD-10-CM | POA: Diagnosis not present

## 2024-03-17 DIAGNOSIS — D62 Acute posthemorrhagic anemia: Secondary | ICD-10-CM | POA: Diagnosis not present

## 2024-03-17 DIAGNOSIS — K5521 Angiodysplasia of colon with hemorrhage: Secondary | ICD-10-CM | POA: Diagnosis not present

## 2024-03-17 DIAGNOSIS — D649 Anemia, unspecified: Secondary | ICD-10-CM | POA: Diagnosis not present

## 2024-03-17 HISTORY — PX: ESOPHAGOGASTRODUODENOSCOPY: SHX5428

## 2024-03-17 HISTORY — PX: COLONOSCOPY: SHX5424

## 2024-03-17 LAB — BPAM RBC
Blood Product Expiration Date: 202505132359
Blood Product Expiration Date: 202505302359
Blood Product Expiration Date: 202505302359
Blood Product Expiration Date: 202505302359
Blood Product Expiration Date: 202506042359
ISSUE DATE / TIME: 202505091805
ISSUE DATE / TIME: 202505092339
ISSUE DATE / TIME: 202505101230
ISSUE DATE / TIME: 202505110849
ISSUE DATE / TIME: 202505121158
Unit Type and Rh: 5100
Unit Type and Rh: 5100
Unit Type and Rh: 5100
Unit Type and Rh: 5100
Unit Type and Rh: 9500

## 2024-03-17 LAB — CBC
HCT: 25.6 % — ABNORMAL LOW (ref 39.0–52.0)
Hemoglobin: 8.2 g/dL — ABNORMAL LOW (ref 13.0–17.0)
MCH: 28.7 pg (ref 26.0–34.0)
MCHC: 32 g/dL (ref 30.0–36.0)
MCV: 89.5 fL (ref 80.0–100.0)
Platelets: 109 10*3/uL — ABNORMAL LOW (ref 150–400)
RBC: 2.86 MIL/uL — ABNORMAL LOW (ref 4.22–5.81)
RDW: 16.9 % — ABNORMAL HIGH (ref 11.5–15.5)
WBC: 3.3 10*3/uL — ABNORMAL LOW (ref 4.0–10.5)
nRBC: 0 % (ref 0.0–0.2)

## 2024-03-17 LAB — TYPE AND SCREEN
ABO/RH(D): O POS
Antibody Screen: NEGATIVE
Unit division: 0
Unit division: 0
Unit division: 0
Unit division: 0
Unit division: 0

## 2024-03-17 SURGERY — EGD (ESOPHAGOGASTRODUODENOSCOPY)
Anesthesia: General

## 2024-03-17 MED ORDER — HEPARIN SOD (PORK) LOCK FLUSH 100 UNIT/ML IV SOLN
500.0000 [IU] | Freq: Once | INTRAVENOUS | Status: DC
  Filled 2024-03-17: qty 5

## 2024-03-17 MED ORDER — PHENYLEPHRINE 80 MCG/ML (10ML) SYRINGE FOR IV PUSH (FOR BLOOD PRESSURE SUPPORT)
PREFILLED_SYRINGE | INTRAVENOUS | Status: DC | PRN
  Administered 2024-03-17 (×2): 80 ug via INTRAVENOUS

## 2024-03-17 MED ORDER — SODIUM CHLORIDE 0.9 % IV SOLN: INTRAVENOUS | Status: DC

## 2024-03-17 MED ORDER — PROPOFOL 500 MG/50ML IV EMUL
INTRAVENOUS | Status: DC | PRN
  Administered 2024-03-17: 200 ug/kg/min via INTRAVENOUS

## 2024-03-17 MED ORDER — COLCHICINE 0.6 MG PO TABS: 0.6000 mg | ORAL_TABLET | Freq: Every day | ORAL | Status: AC | PRN

## 2024-03-17 MED ORDER — GLYCOPYRROLATE 0.2 MG/ML IJ SOLN
INTRAMUSCULAR | Status: DC | PRN
  Administered 2024-03-17: .2 mg via INTRAVENOUS

## 2024-03-17 MED ORDER — LIDOCAINE HCL (PF) 2 % IJ SOLN
INTRAMUSCULAR | Status: DC | PRN
  Administered 2024-03-17: 100 mg via INTRADERMAL

## 2024-03-17 MED ORDER — EPHEDRINE SULFATE-NACL 50-0.9 MG/10ML-% IV SOSY
PREFILLED_SYRINGE | INTRAVENOUS | Status: DC | PRN
  Administered 2024-03-17: 5 mg via INTRAVENOUS
  Administered 2024-03-17: 10 mg via INTRAVENOUS
  Administered 2024-03-17: 5 mg via INTRAVENOUS

## 2024-03-17 MED ORDER — CHLORHEXIDINE GLUCONATE CLOTH 2 % EX PADS
6.0000 | MEDICATED_PAD | Freq: Every day | CUTANEOUS | Status: DC
  Administered 2024-03-17: 6 via TOPICAL

## 2024-03-17 NOTE — Op Note (Signed)
 [facility] Gastroenterology Patient Name: Chris George Procedure Date: 03/17/2024 11:14 AM MRN: [id number] Account #: [id number] Date of Birth: 1956-04-27 Admit Type: Inpatient Age: 68 Room: [facility] ENDO ROOM 4 Gender: Male Note Status: Finalized Instrument Name: Upper Endoscope [id number] Procedure:             Upper GI endoscopy Indications:           Acute post hemorrhagic anemia Providers:             Marnee Sink MD, MD Referring MD:          Lorrie Rothman (Referring MD) Medicines:             Propofol  per Anesthesia Complications:         No immediate complications. Procedure:             Pre-Anesthesia Assessment:                        - Prior to the procedure, a History and Physical was                         performed, and patient medications and allergies were                         reviewed. The patient's tolerance of previous                         anesthesia was also reviewed. The risks and benefits                         of the procedure and the sedation options and risks                         were discussed with the patient. All questions were                         answered, and informed consent was obtained. Prior                         Anticoagulants: The patient has taken no anticoagulant                         or antiplatelet agents. ASA Grade Assessment: II - A                         patient with mild systemic disease. After reviewing                         the risks and benefits, the patient was deemed in                         satisfactory condition to undergo the procedure.                        After obtaining informed consent, the endoscope was                         passed under direct vision. Throughout the procedure,  the patient's blood pressure, pulse, and oxygen                         saturations were monitored continuously. The Endoscope                         was introduced through the mouth,  and advanced to the                         second part of duodenum. The upper GI endoscopy was                         accomplished without difficulty. The patient tolerated                         the procedure well. Findings:      The examined esophagus was normal.      The stomach was normal.      The examined duodenum was normal. Impression:            - Normal esophagus.                        - Normal stomach.                        - Normal examined duodenum.                        - No specimens collected. Recommendation:        - Return patient to hospital ward for ongoing care.                        - Resume previous diet.                        - Continue present medications.                        - Perform a colonoscopy today. Procedure Code(s):     --- Professional ---                        [phone], Esophagogastroduodenoscopy, flexible,                         transoral; diagnostic, including collection of                         specimen(s) by brushing or washing, when performed                         (separate procedure) Diagnosis Code(s):     --- Professional ---                        D62, Acute posthemorrhagic anemia CPT copyright 2022 American Medical Association. All rights reserved. The codes documented in this report are preliminary and upon coder review may  be revised to meet current compliance requirements. Marnee Sink MD, MD 03/17/2024 11:34:39 AM This report has been signed electronically. Number of Addenda: 0 Note Initiated On: 03/17/2024 11:14 AM Estimated Blood Loss:  Estimated  blood loss: none.      [facility]

## 2024-03-17 NOTE — Anesthesia Preprocedure Evaluation (Signed)
 Anesthesia Evaluation  Patient identified by MRN, date of birth, ID band Patient awake    Reviewed: Allergy & Precautions, H&P , NPO status , Patient's Chart, lab work & pertinent test results, reviewed documented beta blocker date and time   History of Anesthesia Complications Negative for: history of anesthetic complications  Airway Mallampati: II  TM Distance: >3 FB Neck ROM: full    Dental  (+) Teeth Intact, Dental Advidsory Given   Pulmonary neg pulmonary ROS, neg shortness of breath, sleep apnea , pneumonia, neg COPD, neg recent URI   Pulmonary exam normal breath sounds clear to auscultation       Cardiovascular Exercise Tolerance: Good hypertension, On Medications (-) angina +CHF  (-) Past MI and (-) Cardiac Stents negative cardio ROS + dysrhythmias Atrial Fibrillation (-) Valvular Problems/Murmurs Rhythm:regular Rate:Normal     Neuro/Psych  PSYCHIATRIC DISORDERS  Depression    negative neurological ROS  negative psych ROS   GI/Hepatic negative GI ROS, Neg liver ROS, PUD,,,  Endo/Other  diabetes    Renal/GU Renal disease     Musculoskeletal   Abdominal   Peds  Hematology negative hematology ROS (+) Blood dyscrasia, anemia   Anesthesia Other Findings Past Medical History: No date: A-fib (HCC) No date: Anemia No date: Arthritis     Comment:  ankles No date: Cancer [facility])     Comment:  multiple myeloma 10/26/2019: 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HCC) No date: Cardiomyopathy (HCC) No date: Chemotherapy-induced peripheral neuropathy (HCC) No date: CHF (congestive heart failure) (HCC) No date: Depression No date: Dysrhythmia     Comment:  atrial fib No date: HLD (hyperlipidemia) No date: Hypercholesteremia No date: Hypertension No date: Moderate tricuspid insufficiency No date: Multiple myeloma (HCC)     Comment:  not being treated right now per pt 11/11/19 No date: Pneumonia No date: Sleep apnea      Comment:  No CPAP.  OSA resolved with wt loss.   Reproductive/Obstetrics negative OB ROS                             Anesthesia Physical Anesthesia Plan  ASA: 3  Anesthesia Plan: General   Post-op Pain Management: Minimal or no pain anticipated   Induction: Intravenous  PONV Risk Score and Plan: 3 and Propofol  infusion, TIVA and Ondansetron   Airway Management Planned: Natural Airway and Nasal Cannula  Additional Equipment: None  Intra-op Plan:   Post-operative Plan:   Informed Consent: I have reviewed the patients History and Physical, chart, labs and discussed the procedure including the risks, benefits and alternatives for the proposed anesthesia with the patient or authorized representative who has indicated his/her understanding and acceptance.     Dental Advisory Given  Plan Discussed with: Anesthesiologist, CRNA and Surgeon  Anesthesia Plan Comments: (Patient consented for risks of anesthesia including but not limited to:  - adverse reactions to medications - risk of airway placement if required - damage to eyes, teeth, lips or other oral mucosa - nerve damage due to positioning  - sore throat or hoarseness - Damage to heart, brain, nerves, lungs, other parts of body or loss of life  Patient voiced understanding and assent.)       Anesthesia Quick Evaluation

## 2024-03-17 NOTE — Transfer of Care (Signed)
 Immediate Anesthesia Transfer of Care Note  Patient: Chris George  Procedure(s) Performed: GI Bleeding COLONOSCOPY  Patient Location: Endoscopy Unit  Anesthesia Type:General  Level of Consciousness: drowsy  Airway & Oxygen Therapy: Patient Spontanous Breathing  Post-op Assessment: Report given to RN and Post -op Vital signs reviewed and stable  Post vital signs: Reviewed and stable  Last Vitals:  Vitals Value Taken Time  BP 94/54 03/17/24 1150  Temp    Pulse 64 03/17/24 1153  Resp 17 03/17/24 1153  SpO2 100 % 03/17/24 1153  Vitals shown include unfiled device data.  Last Pain:  Vitals:   03/17/24 0611  TempSrc: Oral  PainSc:          Complications: There were no known notable events for this encounter.

## 2024-03-17 NOTE — Discharge Summary (Signed)
 Physician Discharge Summary   Patient: Chris George MRN: [id number] DOB: 05/23/1956  Admit date:     03/13/2024  Discharge date: {dischdate:26783}  Discharge Physician: Sheril Dines   PCP: Lorrie Rothman, MD   Recommendations at discharge:  {Tip this will not be part of the note when signed- Example include specific recommendations for outpatient follow-up, pending tests to follow-up on. (Optional):26781}  ***  Discharge Diagnoses: Principal Problem:   Symptomatic anemia Active Problems:   Cardiomyopathy, idiopathic (HCC)   CKD (chronic kidney disease) stage 4, GFR 15-29 ml/min (HCC)   Lower GI bleed   Angiodysplasia of intestine with hemorrhage  Resolved Problems:   * No resolved hospital problems. 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  However his hemoglobin today is 4.9 down from at least above 7 more recently.  At this time etiology of acute on chronic anemia seems to be patient's lower GI bleed.  Anemia symptomatic and getting 2 unit PRBC tonight.  We will recheck patient hemoglobin around midnight tonight. Target would be> 7 gm/dl  Lower GI bleed This does not seem to be an brisk GI bleed.  Therefore CT angiography at this time would not be helpful.  We will hold patient's Eliquis that he takes for atrial fibrillation and other antiplatelet agents.  Check INR and PTT.  I have requested evaluation from Dr. Emerick Hanlon of GI who will see the patient in the morning.  I have empirically started the patient on pantoprazole  although he is not likely to have upper GI bleed at this time. Clear liquid diet.  CKD (chronic kidney disease) stage 4, GFR 15-29 ml/min (HCC) Patient's BUN is up to 83 and creatinine over 3, his baseline seems to be BUN less than 63 and creatinine less than 3.  Therefore at this time I suspect patient has additional acute kidney injury  due to volume loss .  Patient reports frequent micturition.  Therefore I will do a bladder scan.  Check urinalysis and sodium and creatinine.  Gentle IV hydration tonight.  Monitor intake output. No objection to c.w. chornic lasix .  Malignant neoplasm of prostate (HCC) Per onc: Gleason's 3+4. Patient's PSA increased and peaked at 39.34, but since completing treatment has decreased and his most recent result was 4.65. Nuclear medicine bone scan on December 13, 2022 was reported as negative. PSMA PET per radiation oncology from Mar 21, 2023 did not reveal metastatic disease. Patient completed XRT for local control disease on May 30, 2023.   Personal history of colon cancer Per onc notes from 01/29/2024  Patient completed cycle 9 of adjuvant FOLFOX on June 03, 2020. Given his difficulties with treatment and declining performance status, treatment was discontinued altogether. Colonoscopy on September 03, 2022 did not reveal any significant pathology and recommendation was to repeat in 3 years.   CT scan from December 07, 2022  with enlarging aortocaval node now measuring 1.9 x 1.4 cm.  PET scan results from Mar 15, 2023  with progressive retroperitoneal lymphadenopathy presumably related to patient's colon cancer.  Patient last received chemotherapy with FOLFIRI on October 15, 2023.  CEA has increased significantly and his most recent result is 396.  PET scan results from December 30, 2023 reviewed independently with progressive disease of hypermetabolic retrocaval mass, but no other evidence of metastatic disease.  Patient has completed SBRT to this lesion.  Will repeat PET scan at  the end of May with follow-up 1 to 2 days later.  No other intervention is needed at this time.    Hypertension, essential, benign Hold spironolactone  Hyperlipidemia Continue with Lipitor  Chronic a-fib (HCC) Rate controlled, patient does not use any beta-blockers at home, hold Eliquis as above  Cardiomyopathy, idiopathic  (HCC) This is chronically documented in the chart, continue with patient Bumex tablet.  At this time patient's blood pressure is too soft to consider beta-blockers.  Actually I will hold patient's spironolactone as well.  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 Patient was initially diagnosed and treated in [place], [place]  and treated with single agent Velcade in approximately 2013. He declined maintenance treatment or referral for bone marrow transplant. His M spike has ranged from 1.1-2.0 since July 2020. His most recent result was 1.6. Over the same timeframe his IgG component has ranged from [phone]. His most recent result is 2344. Finally, his kappa free light chains have ranged from 24.8 -75.3 with his most recent result reported at 36.2. Nuclear med bone scan as above. His most recent bone marrow biopsy completed on May 28, 2019 revealed only 10 to 15% plasma cells with no clonality reported. Cytogenetics were also reported as normal. Continue to monitor closely.       {Tip this will not be part of the note when signed Body mass index is 28.65 kg/m. , ,  (Optional):26781}  {(NOTE) Pain control PDMP Statment (Optional):26782} Consultants: *** Procedures performed: ***  Disposition: {Plan; Disposition:26390} Diet recommendation:  Discharge Diet Orders (From admission, onward)     Start     Ordered   03/17/24 0000  Diet - low sodium heart healthy        03/17/24 1630           {Diet_Plan:26776} DISCHARGE MEDICATION: Allergies as of 03/17/2024   No Known Allergies      Medication List     STOP taking these medications    APPLE CIDER VINEGAR PO   traMADol  50 MG tablet Commonly known as: ULTRAM    ZINC 15 PO       TAKE these medications    allopurinol 100 MG tablet Commonly known as: ZYLOPRIM Take 200 mg by mouth daily.   atorvastatin 80 MG tablet Commonly known as: LIPITOR Take 80 mg by mouth daily.   bumetanide  2 MG tablet Commonly known as: BUMEX Take 2 mg by mouth 2 (two) times daily.   colchicine  0.6 MG tablet Take 1 tablet (0.6 mg total) by mouth daily as needed. Until flare resolves. What changed:  when to take this reasons to take this   Eliquis 5 MG Tabs tablet Generic drug: apixaban Take 5 mg by mouth 2 (two) times daily.   lidocaine -prilocaine  cream Commonly known as: EMLA  Apply 1 Application topically daily.   magnesium  chloride 64 MG Tbec SR tablet Commonly known as: SLOW-MAG Take 1 tablet (64 mg total) by mouth daily.   spironolactone 25 MG tablet Commonly known as: ALDACTONE Take 1 tablet by mouth every morning.   tamsulosin  0.4 MG Caps capsule Commonly known as: FLOMAX  Take 1 capsule (0.4 mg total) by mouth daily after supper.        Follow-up Information     Feldpausch, Genetta Kenning, MD Follow up.   Specialty: Family Medicine Why: Hospital follow up Contact information: [address] [place] [address] [phone]  Discharge Exam: Filed Weights   03/15/24 1610 03/16/24 0528 03/17/24 0343  Weight: (!) 192.5 kg 88.4 kg 88 kg   ***  Condition at discharge: {DC Condition:26389}  The results of significant diagnostics from this hospitalization (including imaging, microbiology, ancillary and laboratory) are listed below for reference.   Imaging Studies: DG Chest Port 1 View Result Date: 03/13/2024 CLINICAL DATA:  Patient sent from the cancer center for weakness and low hemoglobin. EXAM: PORTABLE CHEST 1 VIEW COMPARISON:  October 23, 2023 FINDINGS: There is stable left-sided venous Port-A-Cath positioning. The cardiac silhouette is enlarged and unchanged in size. There is no evidence of an acute infiltrate, pleural effusion or pneumothorax. The visualized skeletal structures are unremarkable. IMPRESSION: Stable cardiomegaly without evidence of acute or active cardiopulmonary disease. Electronically Signed   By: Virgle Grime M.D.   On:  03/13/2024 21:51    Microbiology: Results for orders placed or performed during the hospital encounter of 11/12/23  Urine Culture     Status: None   Collection Time: 11/12/23  3:07 PM   Specimen: Urine, Random  Result Value Ref Range Status   Specimen Description   Final    URINE, RANDOM Performed at [facility], [address] [place], [place] [address]    Special Requests   Final    NONE Performed at [facility], [address], [place], [place] [address]    Culture   Final    NO GROWTH Performed at [facility], [address], [place], [place] [address]    Report Status 11/13/2023 FINAL  Final   *Note: Due to a large number of results and/or encounters for the requested time period, some results have not been displayed. A complete set of results can be found in Results Review.    Labs: CBC: Recent Labs  Lab 03/13/24 1351 03/14/24 0400 03/15/24 0452 03/16/24 0804 03/17/24 0940  WBC 2.8* 3.5* 3.1* 3.3* 3.3*  NEUTROABS 2.2  --   --   --   --   HGB 4.9* 6.6* 6.8* 7.0* 8.2*  HCT 15.8* 20.2* 21.0* 21.8* 25.6*  MCV 92.4 88.6 88.2 87.9 89.5  PLT 123* 126* 125* 112* 109*   Basic Metabolic Panel: Recent Labs  Lab 03/13/24 1351 03/14/24 0400 03/15/24 0452  NA 132* 136 135  K 4.1 4.2 4.5  CL 99 101 102  CO2 25 26 24   GLUCOSE 135* 99 96  BUN 83* 76* 72*  CREATININE 3.17* 2.90* 2.82*  CALCIUM  8.9 9.1 8.7*  MG 2.1  --   --    Liver Function Tests: Recent Labs  Lab 03/13/24 1351  AST 19  ALT 12  ALKPHOS 172*  BILITOT 0.8  PROT 7.5  ALBUMIN 3.5   CBG: No results for input(s): "GLUCAP" in the last 168 hours.  Discharge time spent: {LESS THAN/GREATER WGNF:62130} 30 minutes.  Signed: Sheril Dines, MD [facility] 03/17/2024

## 2024-03-17 NOTE — Progress Notes (Signed)
 Progress Note    Chris George  WNU:[id number] DOB: 1956-07-25  DOA: 03/13/2024 PCP: Lorrie Rothman, MD      Brief Narrative:    Medical records reviewed and are as summarized below:  Chris George is a 68 y.o. male  with medical history significant of medical issues as below including malignancy as described.  S/p chemotherapy and radiation therapy.  Patient states he was in his usual state of health till about 5 weeks ago when he started noticing intermittent episodes of his stool being maroon/reddish in color.  Patient does not report any diarrhea vomiting skin bruising or nosebleeding or gum bleeding etc.  Patient did not pay my much attention to it.  Patient states that the episodes of red-colored stool actually stopped about a week ago.  However patient reports a new onset of progressive fatigue which is exertional in nature, relieved by rest.  Associated with slight sensation of shortness of breath.  In spite of this patient has been able to maintain a good functional status including climbing up to his second level apartment.    He was evaluated at the oncology clinic and he was found to have severe anemia with a hemoglobin of 4.9.  He was referred to the ED for further management.  Stool for occult blood was positive in the ED.      Assessment/Plan:   Principal Problem:   Symptomatic anemia Active Problems:   Cardiomyopathy, idiopathic (HCC)   CKD (chronic kidney disease) stage 4, GFR 15-29 ml/min (HCC)   Lower GI bleed    Body mass index is 28.65 kg/m.    Acute on chronic anemia, symptomatic: Hemoglobin up to 8.2.   S/p transfusion with 2 units of PRBCs on 03/13/2024.  S/p transfusion 1 unit of PRBCs on 03/14/2024.  S/p transfusion with 1 unit of PRBCs on 03/16/2024. Hemoglobin trend 4.9-6.6-6.8-7.0-8.2. Monitor H&H.   Acute GI bleeding, probable lower GI bleeding: Reports of bloody stools.  Stool for occult blood was positive in the ED.  Continue IV  Protonix .  Plan for endoscopic workup today.  Follow-up with gastroenterologist.   Chronic atrial fibrillation: Rate controlled.  Eliquis is still on hold.   Hypotension: BP still on the low side.  He is asymptomatic.  Continue to monitor.  Bumex and spironolactone are still on hold.    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 Patient's PSA increased and peaked at 39.34, but since completing treatment has decreased and his most recent result was 4.65. Nuclear medicine bone scan on December 13, 2022 was reported as negative. PSMA PET per radiation oncology from Mar 21, 2023 did not reveal metastatic disease. Patient completed XRT for local control disease on May 30, 2023.    Personal history of colon cancer: Per onc notes from 01/29/2024   Patient completed cycle 9 of adjuvant FOLFOX on June 03, 2020. Given his difficulties with treatment and declining performance status, treatment was discontinued altogether. Colonoscopy on September 03, 2022 did not reveal any significant pathology and recommendation was to repeat in 3 years.    CT scan from December 07, 2022  with enlarging aortocaval node now measuring 1.9 x 1.4 cm.  PET scan results from Mar 15, 2023  with progressive retroperitoneal lymphadenopathy presumably related to patient's colon cancer.  Patient last  received chemotherapy with FOLFIRI on October 15, 2023.  CEA has increased significantly and his most recent result is 396.  PET scan results from December 30, 2023 with progressive disease of hypermetabolic retrocaval mass, but no other evidence of metastatic disease.  Patient has completed SBRT to this lesion.  Will repeat PET scan at the end of May with follow-up 1 to 2 days later.  No other intervention is needed at this time.        Comorbidities include CKD stage IV, hypertension, hyperlipidemia   Diet Order             Diet NPO time specified  Diet effective now                            Consultants: Gastroenterologist  Procedures: None    Medications:    [MAR Hold] allopurinol  100 mg Oral Daily   [MAR Hold] atorvastatin  80 mg Oral Daily   [MAR Hold] Chlorhexidine  Gluconate Cloth  6 each Topical Q0600   [MAR Hold] magnesium  chloride  1 tablet Oral Daily   [MAR Hold] pantoprazole  (PROTONIX ) IV  40 mg Intravenous Q12H   [MAR Hold] psyllium  1 packet Oral Daily   [MAR Hold] sodium chloride  flush  3 mL Intravenous Q12H   [MAR Hold] tamsulosin   0.4 mg Oral QPC supper   Continuous Infusions:  sodium chloride  20 mL/hr at 03/17/24 1022      Anti-infectives (From admission, onward)    None              Family Communication/Anticipated D/C date and plan/Code Status   DVT prophylaxis: SCDs Start: 03/13/24 1853     Code Status: Full Code  Family Communication: None Disposition Plan: Plan to discharge home   Status is: Inpatient Remains inpatient appropriate because: Severe anemia, GI bleed       Subjective:   Interval events noted.  No bloody stools.  He feels better.  Objective:    Vitals:   03/17/24 0343 03/17/24 0611 03/17/24 0613 03/17/24 0845  BP:  (!) 94/59  98/60  Pulse:  (!) 50 (!) 56   Resp:  18  16  Temp:  98 F (36.7 C)  97.7 F (36.5 C)  TempSrc:  Oral    SpO2:  100% 100% 100%  Weight: 88 kg     Height:       No data found.   Intake/Output Summary (Last 24 hours) at 03/17/2024 1031 Last data filed at 03/17/2024 0400 Gross per 24 hour  Intake 1746 ml  Output 600 ml  Net 1146 ml   Filed Weights   03/15/24 0512 03/16/24 0528 03/17/24 0343  Weight: (!) 192.5 kg 88.4 kg 88 kg    Exam:  GEN: NAD SKIN: Warm and dry EYES: No pallor or icterus ENT: MMM CV: RRR PULM: CTA B ABD: soft, ND, NT, +BS CNS: AAO x 3, non  focal EXT: No edema or tenderness       Data Reviewed:   I have personally reviewed following labs and imaging studies:  Labs: Labs show the following:   Basic Metabolic Panel: Recent Labs  Lab 03/13/24 1351 03/14/24 0400 03/15/24 0452  NA 132* 136 135  K 4.1 4.2 4.5  CL 99 101 102  CO2 25 26 24   GLUCOSE 135* 99 96  BUN 83* 76* 72*  CREATININE 3.17* 2.90* 2.82*  CALCIUM  8.9 9.1 8.7*  MG 2.1  --   --  GFR Estimated Creatinine Clearance: 27.5 mL/min (A) (by C-G formula based on SCr of 2.82 mg/dL (H)). Liver Function Tests: Recent Labs  Lab 03/13/24 1351  AST 19  ALT 12  ALKPHOS 172*  BILITOT 0.8  PROT 7.5  ALBUMIN 3.5   No results for input(s): "LIPASE", "AMYLASE" in the last 168 hours. No results for input(s): "AMMONIA" in the last 168 hours. Coagulation profile Recent Labs  Lab 03/14/24 0400  INR 1.6*    CBC: Recent Labs  Lab 03/13/24 1351 03/14/24 0400 03/15/24 0452 03/16/24 0804 03/17/24 0940  WBC 2.8* 3.5* 3.1* 3.3* 3.3*  NEUTROABS 2.2  --   --   --   --   HGB 4.9* 6.6* 6.8* 7.0* 8.2*  HCT 15.8* 20.2* 21.0* 21.8* 25.6*  MCV 92.4 88.6 88.2 87.9 89.5  PLT 123* 126* 125* 112* 109*   Cardiac Enzymes: No results for input(s): "CKTOTAL", "CKMB", "CKMBINDEX", "TROPONINI" in the last 168 hours. BNP (last 3 results) No results for input(s): "PROBNP" in the last 8760 hours. CBG: No results for input(s): "GLUCAP" in the last 168 hours. D-Dimer: No results for input(s): "DDIMER" in the last 72 hours. Hgb A1c: No results for input(s): "HGBA1C" in the last 72 hours. Lipid Profile: No results for input(s): "CHOL", "HDL", "LDLCALC", "TRIG", "CHOLHDL", "LDLDIRECT" in the last 72 hours. Thyroid  function studies: No results for input(s): "TSH", "T4TOTAL", "T3FREE", "THYROIDAB" in the last 72 hours.  Invalid input(s): "FREET3" Anemia work up: No results for input(s): "VITAMINB12", "FOLATE", "FERRITIN", "TIBC", "IRON", "RETICCTPCT" in the last 72  hours. Sepsis Labs: Recent Labs  Lab 03/14/24 0400 03/15/24 0452 03/16/24 0804 03/17/24 0940  WBC 3.5* 3.1* 3.3* 3.3*    Microbiology No results found for this or any previous visit (from the past 240 hours).  Procedures and diagnostic studies:  No results found.              LOS: 2 days   Mera Gunkel  [facility]   Pager on [email or web address]. If 7PM-7AM, please contact night-coverage at [email or web address]     03/17/2024, 10:31 AM

## 2024-03-17 NOTE — Op Note (Signed)
 [facility] Gastroenterology Patient Name: Chris George Procedure Date: 03/17/2024 11:14 AM MRN: [id number] Account #: [id number] Date of Birth: [birth year] Admit Type: Inpatient Age: 68 Room: [facility] ENDO ROOM 4 Gender: Male Note Status: Finalized Instrument Name: Charlyn Cooley [id number] Procedure:             Colonoscopy Indications:           Hematochezia Providers:             Marnee Sink MD, MD Referring MD:          Lorrie Rothman (Referring MD) Medicines:             Propofol  per Anesthesia Complications:         No immediate complications. Procedure:             Pre-Anesthesia Assessment:                        - Prior to the procedure, a History and Physical was                         performed, and patient medications and allergies were                         reviewed. The patient's tolerance of previous                         anesthesia was also reviewed. The risks and benefits                         of the procedure and the sedation options and risks                         were discussed with the patient. All questions were                         answered, and informed consent was obtained. Prior                         Anticoagulants: The patient has taken no anticoagulant                         or antiplatelet agents. ASA Grade Assessment: II - A                         patient with mild systemic disease. After reviewing                         the risks and benefits, the patient was deemed in                         satisfactory condition to undergo the procedure.                        After obtaining informed consent, the colonoscope was                         passed under direct vision. Throughout the procedure,  the patient's blood pressure, pulse, and oxygen                         saturations were monitored continuously. The                         Colonoscope was introduced through the anus and                          advanced to the the ileocolonic anastomosis. The                         colonoscopy was performed without difficulty. The                         patient tolerated the procedure well. The quality of                         the bowel preparation was excellent. Findings:      The perianal and digital rectal examinations were normal.      A moderate amount of stool was found in the rectum and in the sigmoid       colon.      A single small angiodysplastic lesion with bleeding was found in the       sigmoid colon. Coagulation for hemostasis using argon plasma at 2       liters/minute and 20 watts was successful.      Non-bleeding internal hemorrhoids were found during retroflexion. The       hemorrhoids were Grade II (internal hemorrhoids that prolapse but reduce       spontaneously). Impression:            - Stool in the rectum and in the sigmoid colon.                        - A single bleeding colonic angiodysplastic lesion.                         Treated with argon plasma coagulation (APC).                        - Non-bleeding internal hemorrhoids.                        - No specimens collected. Recommendation:        - Return patient to hospital ward for ongoing care.                        - Resume previous diet.                        - Continue present medications. Procedure Code(s):     --- Professional ---                        [phone], Colonoscopy, flexible; with control of                         bleeding, any method Diagnosis Code(s):     --- Professional ---  K92.1, Melena (includes Hematochezia)                        K55.21, Angiodysplasia of colon with hemorrhage CPT copyright 2022 American Medical Association. All rights reserved. The codes documented in this report are preliminary and upon coder review may  be revised to meet current compliance requirements. Marnee Sink MD, MD 03/17/2024 11:48:25 AM This report has been signed  electronically. Number of Addenda: 0 Note Initiated On: 03/17/2024 11:14 AM Scope Withdrawal Time: 0 hours 3 minutes 55 seconds  Total Procedure Duration: 0 hours 8 minutes 55 seconds  Estimated Blood Loss:  Estimated blood loss: none.      [facility]

## 2024-03-18 NOTE — Anesthesia Postprocedure Evaluation (Signed)
 Anesthesia Post Note  Patient: Chris George  Procedure(s) Performed: GI Bleeding COLONOSCOPY  Patient location during evaluation: Endoscopy Anesthesia Type: General Level of consciousness: awake and alert Pain management: pain level controlled Vital Signs Assessment: post-procedure vital signs reviewed and stable Respiratory status: spontaneous breathing, nonlabored ventilation, respiratory function stable and patient connected to nasal cannula oxygen Cardiovascular status: blood pressure returned to baseline and stable Postop Assessment: no apparent nausea or vomiting Anesthetic complications: no  There were no known notable events for this encounter.   Last Vitals:  Vitals:   03/17/24 1210 03/17/24 1327  BP: (!) 94/46 (!) 102/54  Pulse: (!) 59 (!) 59  Resp: 17 16  Temp:  (!) 36.4 C  SpO2: 100% 100%    Last Pain:  Vitals:   03/17/24 1210  TempSrc:   PainSc: 0-No pain                 Enrique Harvest

## 2024-04-01 ENCOUNTER — Other Ambulatory Visit: Payer: Self-pay

## 2024-04-01 DIAGNOSIS — D649 Anemia, unspecified: Secondary | ICD-10-CM

## 2024-04-04 DIAGNOSIS — C184 Malignant neoplasm of transverse colon: Secondary | ICD-10-CM | POA: Diagnosis not present

## 2024-04-04 DIAGNOSIS — F320 Major depressive disorder, single episode, mild: Secondary | ICD-10-CM | POA: Diagnosis not present

## 2024-04-08 ENCOUNTER — Inpatient Hospital Stay: Payer: Medicare HMO | Attending: Oncology

## 2024-04-08 ENCOUNTER — Other Ambulatory Visit: Payer: Self-pay | Admitting: *Deleted

## 2024-04-08 ENCOUNTER — Telehealth: Payer: Self-pay | Admitting: Oncology

## 2024-04-08 ENCOUNTER — Ambulatory Visit: Admission: RE | Admit: 2024-04-08 | Source: Ambulatory Visit

## 2024-04-08 NOTE — Telephone Encounter (Signed)
 Patient called to request his appointments be changed to Monday labs and Tuesday possible transfusion. His car is not repaired and he can't get here tomorrow.   Please let us  know so we can assist with scheduling.

## 2024-04-09 ENCOUNTER — Inpatient Hospital Stay

## 2024-04-10 ENCOUNTER — Inpatient Hospital Stay

## 2024-04-13 ENCOUNTER — Ambulatory Visit

## 2024-04-13 ENCOUNTER — Inpatient Hospital Stay

## 2024-04-14 ENCOUNTER — Other Ambulatory Visit: Payer: Self-pay | Admitting: *Deleted

## 2024-04-14 ENCOUNTER — Ambulatory Visit

## 2024-04-14 ENCOUNTER — Other Ambulatory Visit: Payer: Self-pay | Admitting: Oncology

## 2024-04-14 ENCOUNTER — Ambulatory Visit
Admission: RE | Admit: 2024-04-14 | Discharge: 2024-04-14 | Disposition: A | Discharge status: Home / Self Care | Source: Ambulatory Visit | Attending: Oncology | Admitting: Oncology

## 2024-04-14 ENCOUNTER — Inpatient Hospital Stay: Attending: Oncology

## 2024-04-14 DIAGNOSIS — C184 Malignant neoplasm of transverse colon: Secondary | ICD-10-CM | POA: Insufficient documentation

## 2024-04-14 DIAGNOSIS — Z8546 Personal history of malignant neoplasm of prostate: Secondary | ICD-10-CM | POA: Insufficient documentation

## 2024-04-14 DIAGNOSIS — D649 Anemia, unspecified: Secondary | ICD-10-CM

## 2024-04-14 DIAGNOSIS — N5089 Other specified disorders of the male genital organs: Secondary | ICD-10-CM | POA: Insufficient documentation

## 2024-04-14 DIAGNOSIS — D709 Neutropenia, unspecified: Secondary | ICD-10-CM | POA: Diagnosis not present

## 2024-04-14 DIAGNOSIS — I959 Hypotension, unspecified: Secondary | ICD-10-CM | POA: Insufficient documentation

## 2024-04-14 DIAGNOSIS — R609 Edema, unspecified: Secondary | ICD-10-CM | POA: Insufficient documentation

## 2024-04-14 DIAGNOSIS — N289 Disorder of kidney and ureter, unspecified: Secondary | ICD-10-CM | POA: Diagnosis not present

## 2024-04-14 DIAGNOSIS — C189 Malignant neoplasm of colon, unspecified: Secondary | ICD-10-CM | POA: Diagnosis present

## 2024-04-14 DIAGNOSIS — Z5111 Encounter for antineoplastic chemotherapy: Secondary | ICD-10-CM | POA: Insufficient documentation

## 2024-04-14 DIAGNOSIS — Z5189 Encounter for other specified aftercare: Secondary | ICD-10-CM | POA: Diagnosis not present

## 2024-04-14 DIAGNOSIS — D509 Iron deficiency anemia, unspecified: Secondary | ICD-10-CM

## 2024-04-14 DIAGNOSIS — D472 Monoclonal gammopathy: Secondary | ICD-10-CM | POA: Insufficient documentation

## 2024-04-14 DIAGNOSIS — C61 Malignant neoplasm of prostate: Secondary | ICD-10-CM | POA: Insufficient documentation

## 2024-04-14 DIAGNOSIS — F419 Anxiety disorder, unspecified: Secondary | ICD-10-CM | POA: Diagnosis not present

## 2024-04-14 DIAGNOSIS — G629 Polyneuropathy, unspecified: Secondary | ICD-10-CM | POA: Insufficient documentation

## 2024-04-14 DIAGNOSIS — R4701 Aphasia: Secondary | ICD-10-CM | POA: Insufficient documentation

## 2024-04-14 DIAGNOSIS — Z8572 Personal history of non-Hodgkin lymphomas: Secondary | ICD-10-CM | POA: Diagnosis not present

## 2024-04-14 LAB — CMP (CANCER CENTER ONLY)
ALT: 25 U/L (ref 0–44)
AST: 33 U/L (ref 15–41)
Albumin: 3.7 g/dL (ref 3.5–5.0)
Alkaline Phosphatase: 334 U/L — ABNORMAL HIGH (ref 38–126)
Anion gap: 10 (ref 5–15)
BUN: 87 mg/dL — ABNORMAL HIGH (ref 8–23)
CO2: 27 mmol/L (ref 22–32)
Calcium: 8.8 mg/dL — ABNORMAL LOW (ref 8.9–10.3)
Chloride: 97 mmol/L — ABNORMAL LOW (ref 98–111)
Creatinine: 3.41 mg/dL — ABNORMAL HIGH (ref 0.61–1.24)
GFR, Estimated: 19 mL/min — ABNORMAL LOW (ref >60)
Glucose, Bld: 97 mg/dL (ref 70–99)
Potassium: 4 mmol/L (ref 3.5–5.1)
Sodium: 134 mmol/L — ABNORMAL LOW (ref 135–145)
Total Bilirubin: 0.9 mg/dL (ref 0.0–1.2)
Total Protein: 8.3 g/dL — ABNORMAL HIGH (ref 6.5–8.1)

## 2024-04-14 LAB — CBC WITH DIFFERENTIAL (CANCER CENTER ONLY)
Abs Immature Granulocytes: 0 10*3/uL (ref 0.00–0.07)
Basophils Absolute: 0 10*3/uL (ref 0.0–0.1)
Basophils Relative: 0 %
Eosinophils Absolute: 0.1 10*3/uL (ref 0.0–0.5)
Eosinophils Relative: 5 %
HCT: 23.8 % — ABNORMAL LOW (ref 39.0–52.0)
Hemoglobin: 7.5 g/dL — ABNORMAL LOW (ref 13.0–17.0)
Immature Granulocytes: 0 %
Lymphocytes Relative: 14 %
Lymphs Abs: 0.3 10*3/uL — ABNORMAL LOW (ref 0.7–4.0)
MCH: 27.8 pg (ref 26.0–34.0)
MCHC: 31.5 g/dL (ref 30.0–36.0)
MCV: 88.1 fL (ref 80.0–100.0)
Monocytes Absolute: 0.4 10*3/uL (ref 0.1–1.0)
Monocytes Relative: 17 %
Neutro Abs: 1.6 10*3/uL — ABNORMAL LOW (ref 1.7–7.7)
Neutrophils Relative %: 64 %
Platelet Count: 118 10*3/uL — ABNORMAL LOW (ref 150–400)
RBC: 2.7 MIL/uL — ABNORMAL LOW (ref 4.22–5.81)
RDW: 15.9 % — ABNORMAL HIGH (ref 11.5–15.5)
WBC Count: 2.5 10*3/uL — ABNORMAL LOW (ref 4.0–10.5)
nRBC: 0 % (ref 0.0–0.2)

## 2024-04-14 LAB — GLUCOSE, CAPILLARY: Glucose-Capillary: 98 mg/dL (ref 70–99)

## 2024-04-14 LAB — PREPARE RBC (CROSSMATCH)

## 2024-04-14 LAB — MAGNESIUM: Magnesium: 2.1 mg/dL (ref 1.7–2.4)

## 2024-04-14 LAB — PSA: Prostatic Specific Antigen: 1.65 ng/mL (ref 0.00–4.00)

## 2024-04-14 MED ORDER — FLUDEOXYGLUCOSE F - 18 (FDG) INJECTION
9.9800 | Freq: Once | INTRAVENOUS | Status: AC | PRN
  Administered 2024-04-14: 9.98 via INTRAVENOUS

## 2024-04-15 ENCOUNTER — Encounter: Payer: Self-pay | Admitting: Radiation Oncology

## 2024-04-15 ENCOUNTER — Ambulatory Visit
Admission: RE | Admit: 2024-04-15 | Discharge: 2024-04-15 | Disposition: A | Discharge status: Home / Self Care | Payer: Medicare HMO | Source: Ambulatory Visit | Attending: Radiation Oncology | Admitting: Radiation Oncology

## 2024-04-15 ENCOUNTER — Inpatient Hospital Stay

## 2024-04-15 ENCOUNTER — Other Ambulatory Visit: Payer: Self-pay | Admitting: *Deleted

## 2024-04-15 ENCOUNTER — Inpatient Hospital Stay (HOSPITAL_BASED_OUTPATIENT_CLINIC_OR_DEPARTMENT_OTHER): Admitting: Oncology

## 2024-04-15 ENCOUNTER — Encounter: Payer: Self-pay | Admitting: Oncology

## 2024-04-15 VITALS — BP 98/60 | HR 58 | Temp 98.0°F | Resp 16

## 2024-04-15 VITALS — BP 98/60 | HR 58 | Temp 98.0°F | Resp 16 | Ht 69.0 in | Wt 182.0 lb

## 2024-04-15 DIAGNOSIS — C9000 Multiple myeloma not having achieved remission: Secondary | ICD-10-CM | POA: Diagnosis not present

## 2024-04-15 DIAGNOSIS — C61 Malignant neoplasm of prostate: Secondary | ICD-10-CM | POA: Diagnosis not present

## 2024-04-15 DIAGNOSIS — C184 Malignant neoplasm of transverse colon: Secondary | ICD-10-CM | POA: Diagnosis not present

## 2024-04-15 DIAGNOSIS — Z5111 Encounter for antineoplastic chemotherapy: Secondary | ICD-10-CM | POA: Diagnosis not present

## 2024-04-15 DIAGNOSIS — D649 Anemia, unspecified: Secondary | ICD-10-CM

## 2024-04-15 LAB — CEA: CEA: 352 ng/mL — ABNORMAL HIGH (ref 0.0–4.7)

## 2024-04-15 MED ORDER — ACETAMINOPHEN 325 MG PO TABS
650.0000 mg | ORAL_TABLET | Freq: Once | ORAL | Status: AC
  Administered 2024-04-15: 650 mg via ORAL
  Filled 2024-04-15: qty 2

## 2024-04-15 MED ORDER — DIPHENHYDRAMINE HCL 50 MG/ML IJ SOLN
25.0000 mg | Freq: Once | INTRAMUSCULAR | Status: DC
  Filled 2024-04-15: qty 1

## 2024-04-15 MED ORDER — SODIUM CHLORIDE 0.9% IV SOLUTION
250.0000 mL | INTRAVENOUS | Status: DC
  Administered 2024-04-15: 100 mL via INTRAVENOUS
  Filled 2024-04-15: qty 250

## 2024-04-15 MED ORDER — HEPARIN SOD (PORK) LOCK FLUSH 100 UNIT/ML IV SOLN
500.0000 [IU] | Freq: Every day | INTRAVENOUS | Status: AC | PRN
  Administered 2024-04-15: 500 [IU]
  Filled 2024-04-15: qty 5

## 2024-04-15 NOTE — Progress Notes (Signed)
 Radiation Oncology Follow up Note  Name: Chris George   Date:   04/15/2024 MRN:  [id number] DOB: [birth year]    This 68 y.o. male presents to the clinic today for follow-up having been treated with prior radiation therapy to his prostate for Gleason 7 adenocarcinoma.  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  Last treatment was 2 retrocaval nodal mass i as only area of active metastatic disease  REFERRING PROVIDER: Lorrie Rothman, MD  HPI: Patient is a 68 year old male with complicated medical history.  Previously been treated with IMRT radiation therapy to his prostate for Gleason 7 adenocarcinoma back approximately 2 years prior.  He also received SBRT treatment in March of this year to a root retrocaval nodal mass which most likely was related to his metastatic colon cancer.Chris George  He is seen today in follow-up continues to be rather weak and fatigued.  Patient specifically denies any increased lower urinary tract symptoms.  Does have occasional loose stools.  His most recent PSA is 1.65 down slightly from February 25 at 1.9.  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  He does have chronic anemia.  He had a PET scan yesterday which I have reviewed.  He does still have a hypermetabolic retrocaval node as well as a slightly hypermetabolic mass of other periaortic adenopathy.  COMPLICATIONS OF TREATMENT: none  FOLLOW UP COMPLIANCE: keeps appointments   PHYSICAL EXAM:  BP 98/60   Pulse (!) 58   Temp 98 F (36.7 C) (Tympanic)   Resp 16  Well-developed well-nourished patient in NAD. HEENT reveals PERLA, EOMI, discs not visualized.  Oral cavity is clear. No oral mucosal lesions are identified. Neck is clear without evidence of cervical or supraclavicular adenopathy. Lungs are clear to A&P. Cardiac examination is essentially unremarkable with regular rate and rhythm without murmur  rub or thrill. Abdomen is benign with no organomegaly or masses noted. Motor sensory and DTR levels are equal and symmetric in the upper and lower extremities. Cranial nerves II through XII are grossly intact. Proprioception is intact. No peripheral adenopathy or edema is identified. No motor or sensory levels are noted. Crude visual fields are within normal range.  RADIOLOGY RESULTS: PET CT scan reviewed compatible with above-stated findings  PLAN: At this time patient has single retrocaval node which is hypermetabolic on PET scan at this time I will discuss case with medical oncology although he continues observation by medical oncology for all 3 lines of disease including locally advanced rectal cancer prostate cancer and myeloma.  I have asked to see him back in 6 months for follow-up.  Patient knows to call sooner with any concerns.  Certainly would reevaluate the patient anytime should medical oncology feel need for further treatment.  I would like to take this opportunity to thank you for allowing me to participate in the care of your patient.Glenis Langdon, MD

## 2024-04-15 NOTE — Progress Notes (Signed)
 [facility]  [phone] Fax:(336) [phone]  ID: Chris George OB: 03-13-1956  MR#: [id number]  AOZ#:[id number]  Patient Care Team: Lorrie Rothman, MD as PCP - General (Family Medicine) Shellie Dials, MD as Consulting Physician (Oncology) Bufford Carne, MD as Referring Physician (Neurology) Windell Hasty, MD as Consulting Physician (Nephrology) Marcheta Seta, MD as Referring Physician (Cardiology) Glenis Langdon, MD as Consulting Physician (Radiation Oncology)  CHIEF COMPLAINT: Recurrent colon cancer, smoldering myeloma, prostate cancer.  INTERVAL HISTORY: Patient returns to clinic today for further evaluation and consideration of blood transfusion.  He continues to have chronic weakness and fatigue, but states this has improved.  He otherwise feels well. He has a mild expressive aphasia and a chronic peripheral neuropathy, but no other neurologic complaints.  He denies any recent fevers.  He has a good appetite.  He has no chest pain, cough, shortness of breath, or hemoptysis.  He denies any nausea, vomiting, constipation, or diarrhea.  He has no further melena or hematochezia.  He has no urinary complaints.  Patient offers no further specific complaints today.  REVIEW OF SYSTEMS:   Review of Systems  Constitutional:  Positive for malaise/fatigue. Negative for fever and weight loss.  Respiratory: Negative.  Negative for cough, hemoptysis and shortness of breath.   Cardiovascular: Negative.  Negative for chest pain and leg swelling.  Gastrointestinal: Negative.  Negative for blood in stool, diarrhea and melena.  Genitourinary: Negative.  Negative for hematuria.  Musculoskeletal: Negative.  Negative for back pain and joint pain.  Skin: Negative.  Negative for rash.  Neurological:  Positive for tingling, sensory change and weakness. Negative for dizziness, focal weakness and headaches.  Psychiatric/Behavioral: Negative.  Negative for depression  and hallucinations. The patient is not nervous/anxious.     As per HPI. Otherwise, a complete review of systems is negative.  PAST MEDICAL HISTORY: Past Medical History:  Diagnosis Date   A-fib (HCC)    Anemia    Arthritis    ankles   Cancer (HCC)    multiple myeloma   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HCC) 10/26/2019   Cardiomyopathy (HCC)    Chemotherapy-induced peripheral neuropathy (HCC)    CHF (congestive heart failure) (HCC)    Depression    Dysrhythmia    atrial fib   History of CVA (cerebrovascular accident)    HLD (hyperlipidemia)    Hypercholesteremia    Hypertension    Moderate tricuspid insufficiency    Multiple myeloma (HCC)    not being treated right now per pt 11/11/19   Pneumonia    Sleep apnea    No CPAP.  OSA resolved with wt loss.    PAST SURGICAL HISTORY: Past Surgical History:  Procedure Laterality Date   ATRIAL ABLATION SURGERY     CARDIAC SURGERY     A-Fib Ablations   COLONOSCOPY N/A 03/17/2024   Procedure: COLONOSCOPY;  Surgeon: Marnee Sink, MD;  Location: [facility] ENDOSCOPY;  Service: Endoscopy;  Laterality: N/A;   COLONOSCOPY WITH PROPOFOL  N/A 10/26/2019   Procedure: COLONOSCOPY WITH BIOPSY;  Surgeon: Marnee Sink, MD;  Location: [facility] SURGERY CNTR;  Service: Endoscopy;  Laterality: N/A;   COLONOSCOPY WITH PROPOFOL  N/A 09/03/2022   Procedure: COLONOSCOPY WITH PROPOFOL ;  Surgeon: Marnee Sink, MD;  Location: [facility] SURGERY CNTR;  Service: Endoscopy;  Laterality: N/A;   ESOPHAGOGASTRODUODENOSCOPY N/A 03/17/2024   Procedure: GI Bleeding;  Surgeon: Marnee Sink, MD;  Location: [facility] ENDOSCOPY;  Service: Endoscopy;  Laterality: N/A;  GI Bleeding, EGD   ESOPHAGOGASTRODUODENOSCOPY (  EGD) WITH PROPOFOL  N/A 10/26/2019   Procedure: ESOPHAGOGASTRODUODENOSCOPY (EGD) WITH BIOPSY;  Surgeon: Marnee Sink, MD;  Location: [facility] SURGERY CNTR;  Service: Endoscopy;  Laterality: N/A;  sleep apnea   LAPAROSCOPIC PARTIAL COLECTOMY Right 11/17/2019   Procedure: LAPAROSCOPIC  PARTIAL COLECTOMY RIGHT EXTENDED;  Surgeon: Emmalene Hare, MD;  Location: [facility] ORS;  Service: General;  Laterality: Right;   PORTACATH PLACEMENT N/A 12/28/2019   Procedure: INSERTION PORT-A-CATH Left subclavian;  Surgeon: Emmalene Hare, MD;  Location: [facility] ORS;  Service: General;  Laterality: N/A;    FAMILY HISTORY: Family History  Problem Relation Age of Onset   Healthy Mother     ADVANCED DIRECTIVES (Y/N):  N  HEALTH MAINTENANCE: Social History   Tobacco Use   Smoking status: Never    Passive exposure: Never   Smokeless tobacco: Never  Vaping Use   Vaping status: Never Used  Substance Use Topics   Alcohol use: Not Currently   Drug use: Never     Colonoscopy:  PAP:  Bone density:  Lipid panel:  No Known Allergies  Current Outpatient Medications  Medication Sig Dispense Refill   allopurinol  (ZYLOPRIM ) 100 MG tablet Take 200 mg by mouth daily.     apixaban (ELIQUIS) 5 MG TABS tablet Take 5 mg by mouth 2 (two) times daily.     atorvastatin  (LIPITOR) 80 MG tablet Take 80 mg by mouth daily.     bumetanide  (BUMEX ) 2 MG tablet Take 2 mg by mouth 2 (two) times daily.     colchicine  0.6 MG tablet Take 1 tablet (0.6 mg total) by mouth daily as needed. Until flare resolves.     lidocaine -prilocaine  (EMLA ) cream Apply 1 Application topically daily.     magnesium  chloride (SLOW-MAG) 64 MG TBEC SR tablet Take 1 tablet (64 mg total) by mouth daily. 60 tablet 1   spironolactone (ALDACTONE) 25 MG tablet Take 1 tablet by mouth every morning.     tamsulosin  (FLOMAX ) 0.4 MG CAPS capsule Take 1 capsule (0.4 mg total) by mouth daily after supper. 30 capsule 6   No current facility-administered medications for this visit.   Facility-Administered Medications Ordered in Other Visits  Medication Dose Route Frequency Provider Last Rate Last Admin   heparin  lock flush 100 unit/mL  500 Units Intravenous Once Bassheva Flury J, MD        OBJECTIVE: Vitals:   04/15/24 0955  BP: 98/60   Pulse: (!) 58  Resp: 16  Temp: 98 F (36.7 C)       Body mass index is 26.88 kg/m.    ECOG FS:1 - Symptomatic but completely ambulatory  General: Well-developed, well-nourished, no acute distress. Eyes: Pink conjunctiva, anicteric sclera. HEENT: Normocephalic, moist mucous membranes. Lungs: No audible wheezing or coughing. Heart: Regular rate and rhythm. Abdomen: Soft, nontender, no obvious distention. Musculoskeletal: No edema, cyanosis, or clubbing. Neuro: Alert, answering all questions appropriately. Cranial nerves grossly intact. Skin: No rashes or petechiae noted. Psych: Normal affect.  LAB RESULTS:  Lab Results  Component Value Date   NA 134 (L) 04/14/2024   K 4.0 04/14/2024   CL 97 (L) 04/14/2024   CO2 27 04/14/2024   GLUCOSE 97 04/14/2024   BUN 87 (H) 04/14/2024   CREATININE 3.41 (H) 04/14/2024   CALCIUM  8.8 (L) 04/14/2024   PROT 8.3 (H) 04/14/2024   ALBUMIN 3.7 04/14/2024   AST 33 04/14/2024   ALT 25 04/14/2024   ALKPHOS 334 (H) 04/14/2024   BILITOT 0.9 04/14/2024   GFRNONAA 19 (L) 04/14/2024  GFRAA >60 07/29/2020    Lab Results  Component Value Date   WBC 2.5 (L) 04/14/2024   NEUTROABS 1.6 (L) 04/14/2024   HGB 7.5 (L) 04/14/2024   HCT 23.8 (L) 04/14/2024   MCV 88.1 04/14/2024   PLT 118 (L) 04/14/2024   Lab Results  Component Value Date   IRON 79 08/13/2023   TIBC 234 (L) 08/13/2023   IRONPCTSAT 34 08/13/2023   Lab Results  Component Value Date   FERRITIN 686 (H) 09/03/2023     STUDIES: No results found.    ONCOLOGY HISTORY: Patient completed cycle 9 of adjuvant FOLFOX on June 03, 2020.  Given his difficulties with treatment and declining performance status, treatment was discontinued altogether. Colonoscopy on September 03, 2022 did not reveal any significant pathology and recommendation was to repeat in 3 years.    ASSESSMENT: Stage IIIc colon cancer, smoldering myeloma, prostate cancer.  PLAN:    Stage IIIc colon cancer:  CT scan  from December 07, 2022 reviewed independently with enlarging aortocaval node now measuring 1.9 x 1.4 cm.  PET scan results from Mar 15, 2023 reviewed independently with progressive retroperitoneal lymphadenopathy presumably related to patient's colon cancer.  Patient last received chemotherapy with FOLFIRI on October 15, 2023.  PET scan results from December 30, 2023 reviewed independently with progressive disease of hypermetabolic retrocaval mass, but no other evidence of metastatic disease.  Patient has completed SBRT to this lesion.  Despite treatment, patient CEA remains essentially stable since January 2025 ranging between 327 and 396.  His most recent result was 252.  Repeat PET scan from April 14, 2024 is pending at time of dictation.  No intervention is needed at this time.  Return to clinic in 1 month with repeat laboratory work and further evaluation.   Prostate cancer: Gleason's 3+4.  Patient's PSA increased and peaked at 39.34, but since completing treatment it continues to trend down and is now 1.65.  Nuclear medicine bone scan on December 13, 2022 was reported as negative.  PSMA PET per radiation oncology from Mar 21, 2023 did not reveal metastatic disease.  Patient completed XRT for local control disease on May 30, 2023.  Repeat PET scan is pending as above.  Can consider Eligard in the future.  Continue follow-up with urology as indicated. Smoldering myeloma: Chronic and unchanged.  Patient was initially diagnosed and treated in [place], [place]  and treated with single agent Velcade in approximately 2013.  He declined maintenance treatment or referral for bone marrow transplant.  His M spike has ranged from 1.1-2.0 since July 2020.  His most recent result was 1.6.  Over the same timeframe his IgG component has ranged from [phone].  His most recent result is 2344.  Finally, his kappa free light chains have ranged from 24.8 -75.3 with his most recent result reported at 36.2.  Nuclear med bone scan  as above.  His most recent bone marrow biopsy completed on May 28, 2019 revealed only 10 to 15% plasma cells with no clonality reported.  Cytogenetics were also reported as normal.  Repeat laboratory work with next blood draw. Anemia: Hemoglobin remains significantly decreased at 7.5, but improved from previous.  Proceed with 1 unit packed red blood cells today.  Monitor. Neutropenia: Patient's current ANC is 1.6.  Previously, patient required Udenyca  with treatments.  Continue with dose reduced FOLFIRI if chemotherapy is reinitiated.   Thrombocytopenia: Chronic and unchanged.  Patient's platelet count is 118. Renal insufficiency: GFR is trended down slightly to 19.  Continue follow-up with nephrology as scheduled. Peripheral neuropathy: Chronic and unchanged.  Patient reports gabapentin  did not help.  Continue Lyrica  50 mg daily and was previously given a referral to neurology.   Hypotension: Chronic and unchanged.  Patient is asymptomatic.  Monitor.  Coping/anxiety: Patient was previously given a referral to [facility].  Patient reports he talks to them approximately once per week. CHF exacerbation/edema: Resolved.  Patient was recently at [facility] for nearly a month.  Continue follow-up with cardiology and nephrology. Swollen testicles: Ultrasound results noted.  Follow-up with urology as scheduled.    Patient expressed understanding and was in agreement with this plan. He also understands that He can call clinic at any time with any questions, concerns, or complaints.    Shellie Dials, MD   04/15/2024 10:16 AM

## 2024-04-16 ENCOUNTER — Ambulatory Visit: Payer: Self-pay

## 2024-04-16 LAB — TYPE AND SCREEN
ABO/RH(D): O POS
Antibody Screen: NEGATIVE
Unit division: 0

## 2024-04-16 LAB — BPAM RBC
Blood Product Expiration Date: 202506272359
ISSUE DATE / TIME: 202506111108
Unit Type and Rh: 202506272359
Unit Type and Rh: 5100

## 2024-04-17 ENCOUNTER — Other Ambulatory Visit: Payer: Self-pay

## 2024-04-21 ENCOUNTER — Inpatient Hospital Stay (HOSPITAL_BASED_OUTPATIENT_CLINIC_OR_DEPARTMENT_OTHER): Admitting: Oncology

## 2024-04-21 ENCOUNTER — Encounter: Payer: Self-pay | Admitting: Oncology

## 2024-04-21 VITALS — BP 95/41 | Temp 97.2°F | Resp 16 | Ht 69.0 in | Wt 180.0 lb

## 2024-04-21 DIAGNOSIS — C184 Malignant neoplasm of transverse colon: Secondary | ICD-10-CM | POA: Diagnosis not present

## 2024-04-21 DIAGNOSIS — Z5111 Encounter for antineoplastic chemotherapy: Secondary | ICD-10-CM | POA: Diagnosis not present

## 2024-04-21 NOTE — Progress Notes (Signed)
 [facility]  [phone] [phone]  ID: Chris George OB: 1956/06/05  MR#: [id number]  XBM#:[id number]  Patient Care Team: Lorrie Rothman, MD as PCP - General (Family Medicine) Shellie Dials, MD as Consulting Physician (Oncology) Bufford Carne, MD as Referring Physician (Neurology) Windell Hasty, MD as Consulting Physician (Nephrology) Marcheta Seta, MD as Referring Physician (Cardiology) Glenis Langdon, MD as Consulting Physician (Radiation Oncology)  CHIEF COMPLAINT: Recurrent colon cancer, smoldering myeloma, prostate cancer.  INTERVAL HISTORY: Patient returns to clinic today for further evaluation, discussion of his PET scan results, and treatment planning.  He continues to have chronic weakness and fatigue, but otherwise feels well.  He has a mild expressive aphasia and a chronic peripheral neuropathy, but no other neurologic complaints.  He denies any recent fevers.  He has a good appetite.  He has no chest pain, cough, shortness of breath, or hemoptysis.  He denies any nausea, vomiting, constipation, or diarrhea.  He has no further melena or hematochezia.  He has no urinary complaints.  Patient offers no further specific complaints today.  REVIEW OF SYSTEMS:   Review of Systems  Constitutional:  Positive for malaise/fatigue. Negative for fever and weight loss.  Respiratory: Negative.  Negative for cough, hemoptysis and shortness of breath.   Cardiovascular: Negative.  Negative for chest pain and leg swelling.  Gastrointestinal: Negative.  Negative for blood in stool, diarrhea and melena.  Genitourinary: Negative.  Negative for hematuria.  Musculoskeletal: Negative.  Negative for back pain and joint pain.  Skin: Negative.  Negative for rash.  Neurological:  Positive for tingling, sensory change and weakness. Negative for dizziness, focal weakness and headaches.  Psychiatric/Behavioral: Negative.  Negative for depression and  hallucinations. The patient is not nervous/anxious.     As per HPI. Otherwise, a complete review of systems is negative.  PAST MEDICAL HISTORY: Past Medical History:  Diagnosis Date   A-fib (HCC)    Anemia    Arthritis    ankles   Cancer (HCC)    multiple myeloma   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HCC) 10/26/2019   Cardiomyopathy (HCC)    Chemotherapy-induced peripheral neuropathy (HCC)    CHF (congestive heart failure) (HCC)    Depression    Dysrhythmia    atrial fib   History of CVA (cerebrovascular accident)    HLD (hyperlipidemia)    Hypercholesteremia    Hypertension    Moderate tricuspid insufficiency    Multiple myeloma (HCC)    not being treated right now per pt 11/11/19   Pneumonia    Sleep apnea    No CPAP.  OSA resolved with wt loss.    PAST SURGICAL HISTORY: Past Surgical History:  Procedure Laterality Date   ATRIAL ABLATION SURGERY     CARDIAC SURGERY     A-Fib Ablations   COLONOSCOPY N/A 03/17/2024   Procedure: COLONOSCOPY;  Surgeon: Marnee Sink, MD;  Location: [facility] ENDOSCOPY;  Service: Endoscopy;  Laterality: N/A;   COLONOSCOPY WITH PROPOFOL  N/A 10/26/2019   Procedure: COLONOSCOPY WITH BIOPSY;  Surgeon: Marnee Sink, MD;  Location: [facility] SURGERY CNTR;  Service: Endoscopy;  Laterality: N/A;   COLONOSCOPY WITH PROPOFOL  N/A 09/03/2022   Procedure: COLONOSCOPY WITH PROPOFOL ;  Surgeon: Marnee Sink, MD;  Location: [facility] SURGERY CNTR;  Service: Endoscopy;  Laterality: N/A;   ESOPHAGOGASTRODUODENOSCOPY N/A 03/17/2024   Procedure: GI Bleeding;  Surgeon: Marnee Sink, MD;  Location[facility] ENDOSCOPY;  Service: Endoscopy;  Laterality: N/A;  GI Bleeding, EGD   ESOPHAGOGASTRODUODENOSCOPY (EGD)  WITH PROPOFOL  N/A 10/26/2019   Procedure: ESOPHAGOGASTRODUODENOSCOPY (EGD) WITH BIOPSY;  Surgeon: Marnee Sink, MD;  Location: [facility] SURGERY CNTR;  Service: Endoscopy;  Laterality: N/A;  sleep apnea   LAPAROSCOPIC PARTIAL COLECTOMY Right 11/17/2019   Procedure: LAPAROSCOPIC PARTIAL  COLECTOMY RIGHT EXTENDED;  Surgeon: Emmalene Hare, MD;  Location: [facility] ORS;  Service: General;  Laterality: Right;   PORTACATH PLACEMENT N/A 12/28/2019   Procedure: INSERTION PORT-A-CATH Left subclavian;  Surgeon: Emmalene Hare, MD;  Location: [facility] ORS;  Service: General;  Laterality: N/A;    FAMILY HISTORY: Family History  Problem Relation Age of Onset   Healthy Mother     ADVANCED DIRECTIVES (Y/N):  N  HEALTH MAINTENANCE: Social History   Tobacco Use   Smoking status: Never    Passive exposure: Never   Smokeless tobacco: Never  Vaping Use   Vaping status: Never Used  Substance Use Topics   Alcohol use: Not Currently   Drug use: Never     Colonoscopy:  PAP:  Bone density:  Lipid panel:  No Known Allergies  Current Outpatient Medications  Medication Sig Dispense Refill   allopurinol  (ZYLOPRIM ) 100 MG tablet Take 200 mg by mouth daily.     apixaban (ELIQUIS) 5 MG TABS tablet Take 5 mg by mouth 2 (two) times daily.     atorvastatin  (LIPITOR) 80 MG tablet Take 80 mg by mouth daily.     bumetanide  (BUMEX ) 2 MG tablet Take 2 mg by mouth 2 (two) times daily.     colchicine  0.6 MG tablet Take 1 tablet (0.6 mg total) by mouth daily as needed. Until flare resolves.     lidocaine -prilocaine  (EMLA ) cream Apply 1 Application topically daily.     magnesium  chloride (SLOW-MAG) 64 MG TBEC SR tablet Take 1 tablet (64 mg total) by mouth daily. 60 tablet 1   spironolactone (ALDACTONE) 25 MG tablet Take 1 tablet by mouth every morning.     tamsulosin  (FLOMAX ) 0.4 MG CAPS capsule Take 1 capsule (0.4 mg total) by mouth daily after supper. 30 capsule 6   No current facility-administered medications for this visit.   Facility-Administered Medications Ordered in Other Visits  Medication Dose Route Frequency Provider Last Rate Last Admin   heparin  lock flush 100 unit/mL  500 Units Intravenous Once Shahidah Nesbitt J, MD        OBJECTIVE: Vitals:   04/21/24 1008  BP: (!) 95/41  Resp:  16  Temp: (!) 97.2 F (36.2 C)       Body mass index is 26.58 kg/m.    ECOG FS:1 - Symptomatic but completely ambulatory  General: Well-developed, well-nourished, no acute distress. Eyes: Pink conjunctiva, anicteric sclera. HEENT: Normocephalic, moist mucous membranes. Lungs: No audible wheezing or coughing. Heart: Regular rate and rhythm. Abdomen: Soft, nontender, no obvious distention. Musculoskeletal: No edema, cyanosis, or clubbing. Neuro: Alert, answering all questions appropriately. Cranial nerves grossly intact. Skin: No rashes or petechiae noted. Psych: Normal affect.  LAB RESULTS:  Lab Results  Component Value Date   NA 134 (L) 04/14/2024   K 4.0 04/14/2024   CL 97 (L) 04/14/2024   CO2 27 04/14/2024   GLUCOSE 97 04/14/2024   BUN 87 (H) 04/14/2024   CREATININE 3.41 (H) 04/14/2024   CALCIUM  8.8 (L) 04/14/2024   PROT 8.3 (H) 04/14/2024   ALBUMIN 3.7 04/14/2024   AST 33 04/14/2024   ALT 25 04/14/2024   ALKPHOS 334 (H) 04/14/2024   BILITOT 0.9 04/14/2024   GFRNONAA 19 (L) 04/14/2024   GFRAA >60  07/29/2020    Lab Results  Component Value Date   WBC 2.5 (L) 04/14/2024   NEUTROABS 1.6 (L) 04/14/2024   HGB 7.5 (L) 04/14/2024   HCT 23.8 (L) 04/14/2024   MCV 88.1 04/14/2024   PLT 118 (L) 04/14/2024   Lab Results  Component Value Date   IRON 79 08/13/2023   TIBC 234 (L) 08/13/2023   IRONPCTSAT 34 08/13/2023   Lab Results  Component Value Date   FERRITIN 686 (H) 09/03/2023     STUDIES: NM PET Image Restag (PS) Skull Base To Thigh Result Date: 04/15/2024 CLINICAL DATA:  Subsequent treatment strategy for colon cancer. History of prostate cancer and lymphoma as well. EXAM: NUCLEAR MEDICINE PET SKULL BASE TO THIGH TECHNIQUE: 9.98 mCi F-18 FDG was injected intravenously. Full-ring PET imaging was performed from the skull base to thigh after the radiotracer. CT data was obtained and used for attenuation correction and anatomic localization. Fasting blood glucose:  98 mg/dl COMPARISON:  PET-CT 09/60/4540 and older FINDINGS: Mediastinal blood pool activity: SUV max 2.4 Liver activity: SUV max 2.3 NECK: No specific abnormal uptake seen in the neck including along lymph node change of the submandibular, posterior triangle or internal jugular region. Near symmetric uptake of the visualized intracranial compartment. Incidental CT findings: The parotid glands, submandibular glands are unremarkable. Small thyroid  gland. Visualized paranasal sinuses and mastoid air cells are clear. There is significant streak artifact related to the patient's dental hardware. Carotid vascular calcifications are seen. CHEST: No abnormal radiotracer uptake above blood pool in the axillary regions, hilum or mediastinum. No abnormal lung uptake seen at this time. Right paraspinal physiologic muscle uptake is noted. Incidental CT findings: Once again the heart is severely enlarged. No pericardial effusion. Coronary artery calcifications are seen. Bovine type aortic arch. Scattered atherosclerotic plaque. Left IJ chest port identified with tip extending to the central SVC above the right atrium. Slightly patulous thoracic esophagus. Gynecomastia. There is some breathing motion. Few areas of dependent atelectasis or scarring. No consolidation, pneumothorax or effusion. There is a punctate nodule right lower lobe juxtapleural series 6, image 74 measuring 2-3 mm, unchanged from previous when adjusted for technique. Similar nodules elsewhere in the right lower lobe are also stable. These are stable since at least April 2023 and are calcified on older examinations. No specific imaging follow-up. ABDOMEN/PELVIS: On today's examination there is some new small areas of increased uptake in the liver. Example segment 7 on image 81 has a focus of uptake of 4.5 maximum SUV value. Focus more anteriorly towards segment 4 maximum SUV of 4.2. These are small and scattered with additional areas in left hepatic lobe as well  segment 2/3. At least 7 lesions are suggested and would be worrisome for developing liver metastases. The liver itself is nodular in contour on the CT scan. Recommend additional workup such as dynamic MRI, possibly with Eovist. There is diffuse uptake along the stomach above other areas of bowel. The stomach is decompressed but there is diffuse wall thickening suggested. Please correlate with symptoms and further workup when clinically appropriate. Amorphous retroperitoneal soft tissue was seen previously, centered retrocaval. On the prior this had maximum SUV value of 8.4 and is measured at 3.5 x 1.8 cm. Area today when measured in a similar fashion for continuity on image 104 of the CT would measure 3.5 x 2.0 cm. There is also adjacent stranding. Uptake today of this area has maximum SUV of 6.7, slightly decreased. However there is a new hypermetabolic lymph node more  superiorly with maximum SUV of 12.4. This is retrocrural on image 90 and a node in this location measures 11 x 16 mm. On the prior node would have measured 11 by 7 mm. Otherwise there is physiologic distribution radiotracer along the parenchymal organs, bowel and renal collecting systems. The uptake along the left hemiscrotum with hydrocele is decreased today compared to previous. Please correlate clinical findings. Incidental CT findings: Nodular appearance to the liver. Spleen is borderline enlarged at 12.8 cm. Mild mesenteric stranding. Scattered vascular calcifications. Mild renal atrophy. No renal or ureteral stones identified. Enlarged prostate identified. There is some wall thickening along the urinary bladder. Diverticula and trabeculation as well. Large bowel has a normal course and caliber. Surgical changes of right hemicolectomy. Scattered stool. Adrenal glands and pancreas are grossly preserved. Gallbladder is present. SKELETON: No specific pathologic abnormal uptake along the visualized osseous structures. Again decreased uptake along  the lumbar spine is noted. Please correlate with previous intervention Incidental CT findings: Scattered degenerative changes. IMPRESSION: Areas of progression of disease. There are several small hypermetabolic areas in the liver worrisome for liver metastases. Confirmatory study with MRI with and without contrast may be of some benefit such as with Eovist. Retroperitoneal focal ill-defined soft tissue mass on the previous examination is similar in size to the previous but has slightly less uptake overall. However there is a new retrocrural hypermetabolic lymph node consistent with a new area of disease. Uptake with edema and fluid in the scrotum is decreased today compared to previous. Please correlate with history. Electronically Signed   By: Adrianna Horde M.D.   On: 04/15/2024 19:07      ONCOLOGY HISTORY: Patient completed cycle 9 of adjuvant FOLFOX on June 03, 2020.  Given his difficulties with treatment and declining performance status, treatment was discontinued altogether. Colonoscopy on September 03, 2022 did not reveal any significant pathology and recommendation was to repeat in 3 years.    ASSESSMENT: Stage IIIc colon cancer, smoldering myeloma, prostate cancer.  PLAN:    Stage IIIc colon cancer: Patient last received chemotherapy with FOLFIRI on October 15, 2023.  PET scan results from December 30, 2023 reviewed independently with progressive disease of hypermetabolic retrocaval mass, but no other evidence of metastatic disease.  Patient has completed SBRT to this lesion.  Repeat PET scan on April 14, 2024 reviewed independently and reported as above with continued progression of disease now with evidence of liver metastasis.  Patient CEA remains stable, but increased at 352.  After lengthy discussion, patient wishes to reinitiate chemotherapy with FOLFIRI.  Return to clinic in 1 week for consideration of treatment. Prostate cancer: Gleason's 3+4.  Patient's PSA increased and peaked at 39.34,  but since completing treatment it continues to trend down and is now 1.65.  Nuclear medicine bone scan on December 13, 2022 was reported as negative.  PSMA PET per radiation oncology from Mar 21, 2023 did not reveal metastatic disease.  Patient completed XRT for local control disease on May 30, 2023.  Repeat PET scan is pending as above.  Can consider Eligard in the future.  Continue follow-up with urology as indicated. Smoldering myeloma: Chronic and unchanged.  Patient was initially diagnosed and treated in [place], [place]  and treated with single agent Velcade in approximately 2013.  He declined maintenance treatment or referral for bone marrow transplant.  His M spike has ranged from 1.1-2.0 since July 2020.  His most recent result was 1.6.  Over the same timeframe his IgG component has ranged  from [phone].  His most recent result is 2344.  Finally, his kappa free light chains have ranged from 24.8 -75.3 with his most recent result reported at 36.2.  Nuclear med bone scan as above.  His most recent bone marrow biopsy completed on May 28, 2019 revealed only 10 to 15% plasma cells with no clonality reported.  Cytogenetics were also reported as normal.  Repeat laboratory work is pending at time of dictation.   Anemia: Hemoglobin remains significantly decreased at 7.5, but improved from previous.  Proceed with 1 unit packed red blood cells today.  Monitor. Neutropenia: Patient's current ANC is 1.6.  Previously, patient required Udenyca  with treatments.  Continue with dose reduced FOLFIRI as above.   Thrombocytopenia: Chronic and unchanged.  Patient's platelet count is 118. Renal insufficiency: GFR is trended down slightly to 19.  Continue follow-up with nephrology as scheduled. Peripheral neuropathy: Chronic and unchanged.  Patient reports gabapentin  did not help.  Continue Lyrica  50 mg daily and was previously given a referral to neurology.   Hypotension: Chronic and unchanged.  Patient is asymptomatic.   Monitor.  Coping/anxiety: Patient was previously given a referral to [facility].  CHF exacerbation/edema: Resolved.  Patient was recently at [facility] for nearly a month.  Continue follow-up with cardiology and nephrology.  I spent a total of 30 minutes reviewing chart data, face-to-face evaluation with the patient, counseling and coordination of care as detailed above.    Patient expressed understanding and was in agreement with this plan. He also understands that He can call clinic at any time with any questions, concerns, or complaints.    Shellie Dials, MD   04/21/2024 10:27 AM

## 2024-04-27 ENCOUNTER — Encounter: Payer: Self-pay | Admitting: Oncology

## 2024-04-27 ENCOUNTER — Inpatient Hospital Stay: Admitting: Oncology

## 2024-04-27 ENCOUNTER — Inpatient Hospital Stay

## 2024-04-27 ENCOUNTER — Other Ambulatory Visit: Payer: Self-pay | Admitting: Oncology

## 2024-04-27 ENCOUNTER — Telehealth: Payer: Self-pay | Admitting: Oncology

## 2024-04-27 NOTE — Telephone Encounter (Signed)
 Pt is scheduled for lab/md/infusion (tx) today 04/27/24. I called pt to make sure he was okay since his appts started at 8:15am. Pt was very confused and stated his appts are supposed to be Tuesdays and Thursdays. I told pt I would send a msg to MD. Please advise.

## 2024-04-28 ENCOUNTER — Other Ambulatory Visit: Payer: Self-pay | Admitting: Oncology

## 2024-04-28 ENCOUNTER — Encounter: Payer: Self-pay | Admitting: Oncology

## 2024-04-28 ENCOUNTER — Inpatient Hospital Stay (HOSPITAL_BASED_OUTPATIENT_CLINIC_OR_DEPARTMENT_OTHER): Admitting: Oncology

## 2024-04-28 ENCOUNTER — Inpatient Hospital Stay

## 2024-04-28 VITALS — BP 86/42

## 2024-04-28 VITALS — BP 95/44 | HR 57 | Temp 96.6°F | Resp 16 | Ht 69.0 in | Wt 179.6 lb

## 2024-04-28 DIAGNOSIS — C9000 Multiple myeloma not having achieved remission: Secondary | ICD-10-CM

## 2024-04-28 DIAGNOSIS — C61 Malignant neoplasm of prostate: Secondary | ICD-10-CM

## 2024-04-28 DIAGNOSIS — C184 Malignant neoplasm of transverse colon: Secondary | ICD-10-CM

## 2024-04-28 DIAGNOSIS — Z5111 Encounter for antineoplastic chemotherapy: Secondary | ICD-10-CM | POA: Diagnosis not present

## 2024-04-28 LAB — CMP (CANCER CENTER ONLY)
ALT: 15 U/L (ref 0–44)
AST: 25 U/L (ref 15–41)
Albumin: 3.7 g/dL (ref 3.5–5.0)
Alkaline Phosphatase: 279 U/L — ABNORMAL HIGH (ref 38–126)
Anion gap: 9 (ref 5–15)
BUN: 88 mg/dL — ABNORMAL HIGH (ref 8–23)
CO2: 26 mmol/L (ref 22–32)
Calcium: 8.9 mg/dL (ref 8.9–10.3)
Chloride: 96 mmol/L — ABNORMAL LOW (ref 98–111)
Creatinine: 3.5 mg/dL — ABNORMAL HIGH (ref 0.61–1.24)
GFR, Estimated: 18 mL/min — ABNORMAL LOW (ref >60)
Glucose, Bld: 168 mg/dL — ABNORMAL HIGH (ref 70–99)
Potassium: 4.2 mmol/L (ref 3.5–5.1)
Sodium: 131 mmol/L — ABNORMAL LOW (ref 135–145)
Total Bilirubin: 1 mg/dL (ref 0.0–1.2)
Total Protein: 8.3 g/dL — ABNORMAL HIGH (ref 6.5–8.1)

## 2024-04-28 LAB — CBC WITH DIFFERENTIAL (CANCER CENTER ONLY)
Abs Immature Granulocytes: 0.01 10*3/uL (ref 0.00–0.07)
Basophils Absolute: 0 10*3/uL (ref 0.0–0.1)
Basophils Relative: 1 %
Eosinophils Absolute: 0.1 10*3/uL (ref 0.0–0.5)
Eosinophils Relative: 4 %
HCT: 23.4 % — ABNORMAL LOW (ref 39.0–52.0)
Hemoglobin: 7.5 g/dL — ABNORMAL LOW (ref 13.0–17.0)
Immature Granulocytes: 0 %
Lymphocytes Relative: 11 %
Lymphs Abs: 0.3 10*3/uL — ABNORMAL LOW (ref 0.7–4.0)
MCH: 27.7 pg (ref 26.0–34.0)
MCHC: 32.1 g/dL (ref 30.0–36.0)
MCV: 86.3 fL (ref 80.0–100.0)
Monocytes Absolute: 0.4 10*3/uL (ref 0.1–1.0)
Monocytes Relative: 13 %
Neutro Abs: 2.1 10*3/uL (ref 1.7–7.7)
Neutrophils Relative %: 71 %
Platelet Count: 105 10*3/uL — ABNORMAL LOW (ref 150–400)
RBC: 2.71 MIL/uL — ABNORMAL LOW (ref 4.22–5.81)
RDW: 16.2 % — ABNORMAL HIGH (ref 11.5–15.5)
WBC Count: 2.9 10*3/uL — ABNORMAL LOW (ref 4.0–10.5)
nRBC: 0 % (ref 0.0–0.2)

## 2024-04-28 LAB — SAMPLE TO BLOOD BANK

## 2024-04-28 LAB — MAGNESIUM: Magnesium: 2.2 mg/dL (ref 1.7–2.4)

## 2024-04-28 LAB — PSA: Prostatic Specific Antigen: 1.61 ng/mL (ref 0.00–4.00)

## 2024-04-28 MED ORDER — DEXAMETHASONE SODIUM PHOSPHATE 10 MG/ML IJ SOLN
10.0000 mg | Freq: Once | INTRAMUSCULAR | Status: AC
  Administered 2024-04-28: 10 mg via INTRAVENOUS

## 2024-04-28 MED ORDER — FLUOROURACIL CHEMO INJECTION 2.5 GM/50ML: 360.0000 mg/m2 | Freq: Once | INTRAVENOUS | Status: DC

## 2024-04-28 MED ORDER — SODIUM CHLORIDE 0.9 % IV SOLN
2160.0000 mg/m2 | INTRAVENOUS | Status: DC
  Administered 2024-04-28: 4300 mg via INTRAVENOUS
  Filled 2024-04-28: qty 86

## 2024-04-28 MED ORDER — PALONOSETRON HCL INJECTION 0.25 MG/5ML
0.2500 mg | Freq: Once | INTRAVENOUS | Status: AC
  Administered 2024-04-28: 0.25 mg via INTRAVENOUS

## 2024-04-28 MED ORDER — SODIUM CHLORIDE 0.9 % IV SOLN
360.0000 mg/m2 | Freq: Once | INTRAVENOUS | Status: AC
  Administered 2024-04-28: 782 mg via INTRAVENOUS
  Filled 2024-04-28: qty 25

## 2024-04-28 MED ORDER — SODIUM CHLORIDE 0.9 % IV SOLN
Freq: Once | INTRAVENOUS | Status: AC
  Filled 2024-04-28: qty 250

## 2024-04-28 MED ORDER — SODIUM CHLORIDE 0.9 % IV SOLN
160.0000 mg/m2 | Freq: Once | INTRAVENOUS | Status: AC
  Administered 2024-04-28: 340 mg via INTRAVENOUS
  Filled 2024-04-28: qty 15

## 2024-04-28 MED ORDER — FLUOROURACIL CHEMO INJECTION 2.5 GM/50ML
360.0000 mg/m2 | Freq: Once | INTRAVENOUS | Status: AC
  Administered 2024-04-28: 700 mg via INTRAVENOUS
  Filled 2024-04-28: qty 14

## 2024-04-28 MED ORDER — ATROPINE SULFATE 1 MG/ML IV SOLN
0.5000 mg | Freq: Once | INTRAVENOUS | Status: AC | PRN
  Administered 2024-04-28: 0.5 mg via INTRAVENOUS

## 2024-04-28 MED ORDER — SODIUM CHLORIDE 0.9 % IV SOLN: 2160.0000 mg/m2 | INTRAVENOUS | Status: DC

## 2024-04-28 NOTE — Patient Instructions (Signed)

## 2024-04-28 NOTE — Progress Notes (Signed)
 Has been taking an old prescription of hydrocodone /acetaminophen  5-325 mg that has been helping with lower back pain. Was wanting to see if he could get a new rx sent in.

## 2024-04-28 NOTE — Progress Notes (Signed)
 Per Dr Jacobo treatment plan updated with new Wt of 81.5 kg  and BSA of 1.99

## 2024-04-28 NOTE — Progress Notes (Signed)
 [facility]  Telephone:(336) [phone] Fax:(336) [phone]  ID: Chris George OB: [birth year]  MR#: [id number]  RDW#:[id number]  Patient Care Team: Jeffie Cheryl FORBES, MD as PCP - General (Family Medicine) [name], MD as Consulting Physician (Oncology) [name], MD as Referring Physician (Neurology) Marcelino Gales, MD as Consulting Physician (Nephrology) Chris Margery ORN, MD as Referring Physician (Cardiology) Lenn Aran, MD as Consulting Physician (Radiation Oncology)  CHIEF COMPLAINT: Recurrent colon cancer, smoldering myeloma, prostate cancer.  INTERVAL HISTORY: Patient returns to clinic today for further evaluation and reinitiation of FOLFIRI.  He has chronic weakness and fatigue, but otherwise feels well.  He has a mild expressive aphasia and a chronic peripheral neuropathy, but no other neurologic complaints.  He denies any recent fevers.  He has a good appetite.  He has no chest pain, cough, shortness of breath, or hemoptysis.  He denies any nausea, vomiting, constipation, or diarrhea.  He has no further melena or hematochezia.  He has no urinary complaints.  Patient offers no further specific complaints today.  REVIEW OF SYSTEMS:   Review of Systems  Constitutional:  Positive for malaise/fatigue. Negative for fever and weight loss.  Respiratory: Negative.  Negative for cough, hemoptysis and shortness of breath.   Cardiovascular: Negative.  Negative for chest pain and leg swelling.  Gastrointestinal: Negative.  Negative for blood in stool, diarrhea and melena.  Genitourinary: Negative.  Negative for hematuria.  Musculoskeletal: Negative.  Negative for back pain and joint pain.  Skin: Negative.  Negative for rash.  Neurological:  Positive for tingling, sensory change and weakness. Negative for dizziness, focal weakness and headaches.  Psychiatric/Behavioral: Negative.  Negative for depression and hallucinations. The patient is not  nervous/anxious.     As per HPI. Otherwise, a complete review of systems is negative.  PAST MEDICAL HISTORY: Past Medical History:  Diagnosis Date   A-fib (HCC)    Anemia    Arthritis    ankles   Cancer (HCC)    multiple myeloma   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HCC) 10/26/2019   Cardiomyopathy (HCC)    Chemotherapy-induced peripheral neuropathy (HCC)    CHF (congestive heart failure) (HCC)    Depression    Dysrhythmia    atrial fib   History of CVA (cerebrovascular accident)    HLD (hyperlipidemia)    Hypercholesteremia    Hypertension    Moderate tricuspid insufficiency    Multiple myeloma (HCC)    not being treated right now per pt 11/11/19   Pneumonia    Sleep apnea    No CPAP.  OSA resolved with wt loss.    PAST SURGICAL HISTORY: Past Surgical History:  Procedure Laterality Date   ATRIAL ABLATION SURGERY     CARDIAC SURGERY     A-Fib Ablations   COLONOSCOPY N/A 03/17/2024   Procedure: COLONOSCOPY;  Surgeon: Jinny Carmine, MD;  Location: [facility] ENDOSCOPY;  Service: Endoscopy;  Laterality: N/A;   COLONOSCOPY WITH PROPOFOL  N/A 10/26/2019   Procedure: COLONOSCOPY WITH BIOPSY;  Surgeon: Jinny Carmine, MD;  Location: [facility] SURGERY [facility]TR;  Service: Endoscopy;  Laterality: N/A;   COLONOSCOPY WITH PROPOFOL  N/A 09/03/2022   Procedure: COLONOSCOPY WITH PROPOFOL ;  Surgeon: Jinny Carmine, MD;  Location: [facility] SURGERY CNTR;  Service: Endoscopy;  Laterality: N/A;   ESOPHAGOGASTRODUODENOSCOPY N/A 03/17/2024   Procedure: GI Bleeding;  Surgeon: Jinny Carmine, MD;  Location: [facility] ENDOSCOPY;  Service: Endoscopy;  Laterality: N/A;  GI Bleeding, EGD   ESOPHAGOGASTRODUODENOSCOPY (EGD) WITH PROPOFOL  N/A 10/26/2019   Procedure:  ESOPHAGOGASTRODUODENOSCOPY (EGD) WITH BIOPSY;  Surgeon: Jinny Carmine, MD;  Location: [facility] SURGERY CNTR;  Service: Endoscopy;  Laterality: N/A;  sleep apnea   LAPAROSCOPIC PARTIAL COLECTOMY Right 11/17/2019   Procedure: LAPAROSCOPIC PARTIAL COLECTOMY RIGHT EXTENDED;  Surgeon:  Desiderio Schanz, MD;  Location: [facility] ORS;  Service: General;  Laterality: Right;   PORTACATH PLACEMENT N/A 12/28/2019   Procedure: INSERTION PORT-A-CATH Left subclavian;  Surgeon: Desiderio Schanz, MD;  Location: [facility] ORS;  Service: General;  Laterality: N/A;    FAMILY HISTORY: Family History  Problem Relation Age of Onset   Healthy Mother     ADVANCED DIRECTIVES (Y/N):  N  HEALTH MAINTENANCE: Social History   Tobacco Use   Smoking status: Never    Passive exposure: Never   Smokeless tobacco: Never  Vaping Use   Vaping status: Never Used  Substance Use Topics   Alcohol use: Not Currently   Drug use: Never     Colonoscopy:  PAP:  Bone density:  Lipid panel:  No Known Allergies  Current Outpatient Medications  Medication Sig Dispense Refill   allopurinol  (ZYLOPRIM ) 100 MG tablet Take 200 mg by mouth daily.     apixaban (ELIQUIS) 5 MG TABS tablet Take 5 mg by mouth 2 (two) times daily.     atorvastatin  (LIPITOR) 80 MG tablet Take 80 mg by mouth daily.     bumetanide  (BUMEX ) 2 MG tablet Take 2 mg by mouth 2 (two) times daily.     colchicine  0.6 MG tablet Take 1 tablet (0.6 mg total) by mouth daily as needed. Until flare resolves.     lidocaine -prilocaine  (EMLA ) cream Apply 1 Application topically daily.     magnesium  chloride (SLOW-MAG) 64 MG TBEC SR tablet Take 1 tablet (64 mg total) by mouth daily. 60 tablet 1   spironolactone (ALDACTONE) 25 MG tablet Take 1 tablet by mouth every morning.     tamsulosin  (FLOMAX ) 0.4 MG CAPS capsule Take 1 capsule (0.4 mg total) by mouth daily after supper. 30 capsule 6   No current facility-administered medications for this visit.   Facility-Administered Medications Ordered in Other Visits  Medication Dose Route Frequency Provider Last Rate Last Admin   heparin  lock flush 100 unit/mL  500 Units Intravenous Once Mackenzi Krogh J, MD        OBJECTIVE: Vitals:   04/28/24 1138  BP: (!) 95/44  Pulse: (!) 57  Resp: 16  Temp: (!) 96.6  F (35.9 C)  SpO2: 100%       Body mass index is 26.52 kg/m.    ECOG FS:1 - Symptomatic but completely ambulatory  General: Well-developed, well-nourished, no acute distress. Eyes: Pink conjunctiva, anicteric sclera. HEENT: Normocephalic, moist mucous membranes. Lungs: No audible wheezing or coughing. Heart: Regular rate and rhythm. Abdomen: Soft, nontender, no obvious distention. Musculoskeletal: No edema, cyanosis, or clubbing. Neuro: Alert, answering all questions appropriately. Cranial nerves grossly intact. Skin: No rashes or petechiae noted. Psych: Normal affect.  LAB RESULTS:  Lab Results  Component Value Date   NA 131 (L) 04/28/2024   K 4.2 04/28/2024   CL 96 (L) 04/28/2024   CO2 26 04/28/2024   GLUCOSE 168 (H) 04/28/2024   BUN 88 (H) 04/28/2024   CREATININE 3.50 (H) 04/28/2024   CALCIUM  8.9 04/28/2024   PROT 8.3 (H) 04/28/2024   ALBUMIN 3.7 04/28/2024   AST 25 04/28/2024   ALT 15 04/28/2024   ALKPHOS 279 (H) 04/28/2024   BILITOT 1.0 04/28/2024   GFRNONAA 18 (L) 04/28/2024   GFRAA >60  07/29/2020    Lab Results  Component Value Date   WBC 2.9 (L) 04/28/2024   NEUTROABS 2.1 04/28/2024   HGB 7.5 (L) 04/28/2024   HCT 23.4 (L) 04/28/2024   MCV 86.3 04/28/2024   PLT 105 (L) 04/28/2024   Lab Results  Component Value Date   IRON 79 08/13/2023   TIBC 234 (L) 08/13/2023   IRONPCTSAT 34 08/13/2023   Lab Results  Component Value Date   FERRITIN 686 (H) 09/03/2023     STUDIES: NM PET Image Restag (PS) Skull Base To Thigh Result Date: 04/15/2024 CLINICAL DATA:  Subsequent treatment strategy for colon cancer. History of prostate cancer and lymphoma as well. EXAM: NUCLEAR MEDICINE PET SKULL BASE TO THIGH TECHNIQUE: 9.98 mCi F-18 FDG was injected intravenously. Full-ring PET imaging was performed from the skull base to thigh after the radiotracer. CT data was obtained and used for attenuation correction and anatomic localization. Fasting blood glucose: 98 mg/dl  COMPARISON:  PET-CT 97/75/7974 and older FINDINGS: Mediastinal blood pool activity: SUV max 2.4 Liver activity: SUV max 2.3 NECK: No specific abnormal uptake seen in the neck including along lymph node change of the submandibular, posterior triangle or internal jugular region. Near symmetric uptake of the visualized intracranial compartment. Incidental CT findings: The parotid glands, submandibular glands are unremarkable. Small thyroid  gland. Visualized paranasal sinuses and mastoid air cells are clear. There is significant streak artifact related to the patient's dental hardware. Carotid vascular calcifications are seen. CHEST: No abnormal radiotracer uptake above blood pool in the axillary regions, hilum or mediastinum. No abnormal lung uptake seen at this time. Right paraspinal physiologic muscle uptake is noted. Incidental CT findings: Once again the heart is severely enlarged. No pericardial effusion. Coronary artery calcifications are seen. Bovine type aortic arch. Scattered atherosclerotic plaque. Left IJ chest port identified with tip extending to the central SVC above the right atrium. Slightly patulous thoracic esophagus. Gynecomastia. There is some breathing motion. Few areas of dependent atelectasis or scarring. No consolidation, pneumothorax or effusion. There is a punctate nodule right lower lobe juxtapleural series 6, image 74 measuring 2-3 mm, unchanged from previous when adjusted for technique. Similar nodules elsewhere in the right lower lobe are also stable. These are stable since at least April 2023 and are calcified on older examinations. No specific imaging follow-up. ABDOMEN/PELVIS: On today's examination there is some new small areas of increased uptake in the liver. Example segment 7 on image 81 has a focus of uptake of 4.5 maximum SUV value. Focus more anteriorly towards segment 4 maximum SUV of 4.2. These are small and scattered with additional areas in left hepatic lobe as well segment  2/3. At least 7 lesions are suggested and would be worrisome for developing liver metastases. The liver itself is nodular in contour on the CT scan. Recommend additional workup such as dynamic MRI, possibly with Eovist. There is diffuse uptake along the stomach above other areas of bowel. The stomach is decompressed but there is diffuse wall thickening suggested. Please correlate with symptoms and further workup when clinically appropriate. Amorphous retroperitoneal soft tissue was seen previously, centered retrocaval. On the prior this had maximum SUV value of 8.4 and is measured at 3.5 x 1.8 cm. Area today when measured in a similar fashion for continuity on image 104 of the CT would measure 3.5 x 2.0 cm. There is also adjacent stranding. Uptake today of this area has maximum SUV of 6.7, slightly decreased. However there is a new hypermetabolic lymph node more superiorly  with maximum SUV of 12.4. This is retrocrural on image 90 and a node in this location measures 11 x 16 mm. On the prior node would have measured 11 by 7 mm. Otherwise there is physiologic distribution radiotracer along the parenchymal organs, bowel and renal collecting systems. The uptake along the left hemiscrotum with hydrocele is decreased today compared to previous. Please correlate clinical findings. Incidental CT findings: Nodular appearance to the liver. Spleen is borderline enlarged at 12.8 cm. Mild mesenteric stranding. Scattered vascular calcifications. Mild renal atrophy. No renal or ureteral stones identified. Enlarged prostate identified. There is some wall thickening along the urinary bladder. Diverticula and trabeculation as well. Large bowel has a normal course and caliber. Surgical changes of right hemicolectomy. Scattered stool. Adrenal glands and pancreas are grossly preserved. Gallbladder is present. SKELETON: No specific pathologic abnormal uptake along the visualized osseous structures. Again decreased uptake along the lumbar  spine is noted. Please correlate with previous intervention Incidental CT findings: Scattered degenerative changes. IMPRESSION: Areas of progression of disease. There are several small hypermetabolic areas in the liver worrisome for liver metastases. Confirmatory study with MRI with and without contrast may be of some benefit such as with Eovist. Retroperitoneal focal ill-defined soft tissue mass on the previous examination is similar in size to the previous but has slightly less uptake overall. However there is a new retrocrural hypermetabolic lymph node consistent with a new area of disease. Uptake with edema and fluid in the scrotum is decreased today compared to previous. Please correlate with history. Electronically Signed   By: Ranell Bring M.D.   On: 04/15/2024 19:07      ONCOLOGY HISTORY: Patient completed cycle 9 of adjuvant FOLFOX on June 03, 2020.  Given his difficulties with treatment and declining performance status, treatment was discontinued altogether. Colonoscopy on September 03, 2022 did not reveal any significant pathology and recommendation was to repeat in 3 years.    ASSESSMENT: Stage IIIc colon cancer, smoldering myeloma, prostate cancer.  PLAN:    Stage IIIc colon cancer: Patient last received chemotherapy with FOLFIRI on October 15, 2023.  PET scan results from December 30, 2023 reviewed independently with progressive disease of hypermetabolic retrocaval mass, but no other evidence of metastatic disease.  Patient has completed SBRT to this lesion.  Repeat PET scan on April 14, 2024 reviewed independently and reported as above with continued progression of disease now with evidence of liver metastasis.  Patient CEA remains stable, but increased at 352.  Proceed with FOLFIRI today.  Return to clinic in 2 days for pump removal, 1 week for laboratory work, and then in 2 weeks for further evaluation and consideration of low will be considered cycle 11 of FOLFIRI.   Prostate cancer:  Gleason's 3+4.  Patient's PSA increased and peaked at 39.34, but since completing treatment it continues to trend down and is now 1.65.  Repeat results are pending at time of dictation.  Nuclear medicine bone scan on December 13, 2022 was reported as negative.  PSMA PET per radiation oncology from Mar 21, 2023 did not reveal metastatic disease.  Patient completed XRT for local control disease on May 30, 2023.  Repeat PET scan is pending as above.  Can consider Eligard in the future.  Continue follow-up with urology as indicated.   Smoldering myeloma: Chronic and unchanged.  Patient was initially diagnosed and treated in [place], [place]  and treated with single agent Velcade in approximately 2013.  He declined maintenance treatment or referral for bone marrow  transplant.  His M spike has ranged from 1.1-2.0 since July 2020.  His most recent result was 1.6.  Over the same timeframe his IgG component has ranged from [phone].  His most recent result is 2344.  Finally, his kappa free light chains have ranged from 24.8 -75.3 with his most recent result reported at 36.2.  Nuclear med bone scan as above.  His most recent bone marrow biopsy completed on May 28, 2019 revealed only 10 to 15% plasma cells with no clonality reported.  Cytogenetics were also reported as normal.  Repeat laboratory work is pending at time of dictation.   Anemia: Hemoglobin stable at 7.5.  Consider 1 unit packed red blood cells if trends down any further.  Return to clinic in 1 week for laboratory work as above.  Monitor. Neutropenia: Resolved.  Previously, patient required Udenyca  with treatments.  Continue with dose reduced FOLFIRI as above.  Will add back in treatment if necessary. Thrombocytopenia: Chronic and unchanged.  Patient's platelet count is 105 today.   Renal insufficiency: Patient's GFR has trended down slightly to 18.  Neither irinotecan  or 5-FU need to be dosed reduced in renal insufficiency.  Continue follow-up with  nephrology as scheduled. Peripheral neuropathy: Chronic and unchanged.  Patient reports gabapentin  did not help.  Continue Lyrica  50 mg daily and was previously given a referral to neurology.   Hypotension: Chronic and unchanged.  Patient is asymptomatic.  Monitor.  Coping/anxiety: Patient was previously given a referral to [facility].  CHF exacerbation/edema: Resolved.  Patient was recently at [facility] for nearly a month.  Continue follow-up with cardiology and nephrology.   Patient expressed understanding and was in agreement with this plan. He also understands that He can call clinic at any time with any questions, concerns, or complaints.    Evalene JINNY Reusing, MD   04/28/2024 12:19 PM

## 2024-04-28 NOTE — Patient Instructions (Signed)
 [facility] - A DEPT OF [facility]  Discharge Instructions: Thank you for choosing [facility] to provide your oncology and hematology care.  If you have a lab appointment with the Cancer Center, please go directly to the Cancer Center and check in at the registration area.  Wear comfortable clothing and clothing appropriate for easy access to any Portacath or PICC line.   We strive to give you quality time with your provider. You may need to reschedule your appointment if you arrive late (15 or more minutes).  Arriving late affects you and other patients whose appointments are after yours.  Also, if you miss three or more appointments without notifying the office, you may be dismissed from the clinic at the provider's discretion.      For prescription refill requests, have your pharmacy contact our office and allow 72 hours for refills to be completed.    Today you received the following chemotherapy and/or immunotherapy agents IRINOTECAN , LEUCOVORIN , 5 FU      To help prevent nausea and vomiting after your treatment, we encourage you to take your nausea medication as directed.  BELOW ARE SYMPTOMS THAT SHOULD BE REPORTED IMMEDIATELY: *FEVER GREATER THAN 100.4 F (38 C) OR HIGHER *CHILLS OR SWEATING *NAUSEA AND VOMITING THAT IS NOT CONTROLLED WITH YOUR NAUSEA MEDICATION *UNUSUAL SHORTNESS OF BREATH *UNUSUAL BRUISING OR BLEEDING *URINARY PROBLEMS (pain or burning when urinating, or frequent urination) *BOWEL PROBLEMS (unusual diarrhea, constipation, pain near the anus) TENDERNESS IN MOUTH AND THROAT WITH OR WITHOUT PRESENCE OF ULCERS (sore throat, sores in mouth, or a toothache) UNUSUAL RASH, SWELLING OR PAIN  UNUSUAL VAGINAL DISCHARGE OR ITCHING   Items with * indicate a potential emergency and should be followed up as soon as possible or go to the Emergency Department if any problems should occur.  Please show the CHEMOTHERAPY ALERT CARD  or IMMUNOTHERAPY ALERT CARD at check-in to the Emergency Department and triage nurse.  Should you have questions after your visit or need to cancel or reschedule your appointment, please contact [facility] - A DEPT OF [facility]  [phone] and follow the prompts.  Office hours are 8:00 a.m. to 4:30 p.m. Monday - Friday. Please note that voicemails left after 4:00 p.m. may not be returned until the following business day.  We are closed weekends and major holidays. You have access to a nurse at all times for urgent questions. Please call the main number to the clinic [phone] and follow the prompts.  For any non-urgent questions, you may also contact your provider using MyChart. We now offer e-Visits for anyone 45 and older to request care online for non-urgent symptoms. For details visit [email or web address].   Also download the MyChart app! Go to the app store, search MyChart, open the app, select [facility] [place], and log in with your MyChart username and password.  Irinotecan  Injection What is this medication? IRINOTECAN  (ir in oh TEE kan) treats some types of cancer. It works by slowing down the growth of cancer cells. This medicine may be used for other purposes; ask your health care provider or pharmacist if you have questions. COMMON BRAND NAME(S): Camptosar  What should I tell my care team before I take this medication? They need to know if you have any of these conditions: Dehydration Diarrhea Infection, especially a viral infection, such as chickenpox, cold sores, herpes Liver disease Low blood cell levels (white cells, red  cells, and platelets) Low levels of electrolytes, such as calcium , magnesium , or potassium in your blood Recent or ongoing radiation An unusual or allergic reaction to irinotecan , other medications, foods, dyes, or preservatives If you or your partner are pregnant or trying to get pregnant Breast-feeding How should I use  this medication? This medication is injected into a vein. It is given by your care team in a hospital or clinic setting. Talk to your care team about the use of this medication in children. Special care may be needed. Overdosage: If you think you have taken too much of this medicine contact a poison control center or emergency room at once. NOTE: This medicine is only for you. Do not share this medicine with others. What if I miss a dose? Keep appointments for follow-up doses. It is important not to miss your dose. Call your care team if you are unable to keep an appointment. What may interact with this medication? Do not take this medication with any of the following: Cobicistat Itraconazole This medication may also interact with the following: Certain antibiotics, such as clarithromycin, rifampin, rifabutin Certain antivirals for HIV or AIDS Certain medications for fungal infections, such as ketoconazole, posaconazole, voriconazole Certain medications for seizures, such as carbamazepine, phenobarbital, phenytoin Gemfibrozil Nefazodone St. John's wort This list may not describe all possible interactions. Give your health care provider a list of all the medicines, herbs, non-prescription drugs, or dietary supplements you use. Also tell them if you smoke, drink alcohol, or use illegal drugs. Some items may interact with your medicine. What should I watch for while using this medication? Your condition will be monitored carefully while you are receiving this medication. You may need blood work while taking this medication. This medication may make you feel generally unwell. This is not uncommon as chemotherapy can affect healthy cells as well as cancer cells. Report any side effects. Continue your course of treatment even though you feel ill unless your care team tells you to stop. This medication can cause serious side effects. To reduce the risk, your care team may give you other medications  to take before receiving this one. Be sure to follow the directions from your care team. This medication may affect your coordination, reaction time, or judgement. Do not drive or operate machinery until you know how this medication affects you. Sit up or stand slowly to reduce the risk of dizzy or fainting spells. Drinking alcohol with this medication can increase the risk of these side effects. This medication may increase your risk of getting an infection. Call your care team for advice if you get a fever, chills, sore throat, or other symptoms of a cold or flu. Do not treat yourself. Try to avoid being around people who are sick. Avoid taking medications that contain aspirin , acetaminophen , ibuprofen , naproxen, or ketoprofen unless instructed by your care team. These medications may hide a fever. This medication may increase your risk to bruise or bleed. Call your care team if you notice any unusual bleeding. Be careful brushing or flossing your teeth or using a toothpick because you may get an infection or bleed more easily. If you have any dental work done, tell your dentist you are receiving this medication. Talk to your care team if you or your partner are pregnant or think either of you might be pregnant. This medication can cause serious birth defects if taken during pregnancy and for 6 months after the last dose. You will need a negative pregnancy test before  starting this medication. Contraception is recommended while taking this medication and for 6 months after the last dose. Your care team can help you find the option that works for you. Do not father a child while taking this medication and for 3 months after the last dose. Use a condom for contraception during this time period. Do not breastfeed while taking this medication and for 7 days after the last dose. This medication may cause infertility. Talk to your care team if you are concerned about your fertility. What side effects may I  notice from receiving this medication? Side effects that you should report to your care team as soon as possible: Allergic reactions--skin rash, itching, hives, swelling of the face, lips, tongue, or throat Dry cough, shortness of breath or trouble breathing Increased saliva or tears, increased sweating, stomach cramping, diarrhea, small pupils, unusual weakness or fatigue, slow heartbeat Infection--fever, chills, cough, sore throat, wounds that don't heal, pain or trouble when passing urine, general feeling of discomfort or being unwell Kidney injury--decrease in the amount of urine, swelling of the ankles, hands, or feet Low red blood cell level--unusual weakness or fatigue, dizziness, headache, trouble breathing Severe or prolonged diarrhea Unusual bruising or bleeding Side effects that usually do not require medical attention (report to your care team if they continue or are bothersome): Constipation Diarrhea Hair loss Loss of appetite Nausea Stomach pain This list may not describe all possible side effects. Call your doctor for medical advice about side effects. You may report side effects to FDA at 1-800-FDA-1088. Where should I keep my medication? This medication is given in a hospital or clinic. It will not be stored at home. NOTE: This sheet is a summary. It may not cover all possible information. If you have questions about this medicine, talk to your doctor, pharmacist, or health care provider.  2024 Elsevier/Gold Standard (2022-03-05 00:00:00)  Leucovorin  Injection What is this medication? LEUCOVORIN  (loo koe VOR in) prevents side effects from certain medications, such as methotrexate. It works by increasing folate levels. This helps protect healthy cells in your body. It may also be used to treat anemia caused by low levels of folate. It can also be used with fluorouracil , a type of chemotherapy, to treat colorectal cancer. It works by increasing the effects of fluorouracil  in  the body. This medicine may be used for other purposes; ask your health care provider or pharmacist if you have questions. What should I tell my care team before I take this medication? They need to know if you have any of these conditions: Anemia from low levels of vitamin B12 in the blood An unusual or allergic reaction to leucovorin , folic acid, other medications, foods, dyes, or preservatives Pregnant or trying to get pregnant Breastfeeding How should I use this medication? This medication is injected into a vein or a muscle. It is given by your care team in a hospital or clinic setting. Talk to your care team about the use of this medication in children. Special care may be needed. Overdosage: If you think you have taken too much of this medicine contact a poison control center or emergency room at once. NOTE: This medicine is only for you. Do not share this medicine with others. What if I miss a dose? Keep appointments for follow-up doses. It is important not to miss your dose. Call your care team if you are unable to keep an appointment. What may interact with this medication? Capecitabine Fluorouracil  Phenobarbital Phenytoin Primidone Trimethoprim ;sulfamethoxazole  This list  may not describe all possible interactions. Give your health care provider a list of all the medicines, herbs, non-prescription drugs, or dietary supplements you use. Also tell them if you smoke, drink alcohol, or use illegal drugs. Some items may interact with your medicine. What should I watch for while using this medication? Your condition will be monitored carefully while you are receiving this medication. This medication may increase the side effects of 5-fluorouracil . Tell your care team if you have diarrhea or mouth sores that do not get better or that get worse. What side effects may I notice from receiving this medication? Side effects that you should report to your care team as soon as  possible: Allergic reactions--skin rash, itching, hives, swelling of the face, lips, tongue, or throat This list may not describe all possible side effects. Call your doctor for medical advice about side effects. You may report side effects to FDA at 1-800-FDA-1088. Where should I keep my medication? This medication is given in a hospital or clinic. It will not be stored at home. NOTE: This sheet is a summary. It may not cover all possible information. If you have questions about this medicine, talk to your doctor, pharmacist, or health care provider.  2024 Elsevier/Gold Standard (2022-03-27 00:00:00)   Fluorouracil  Injection What is this medication? FLUOROURACIL  (flure oh YOOR a sil) treats some types of cancer. It works by slowing down the growth of cancer cells. This medicine may be used for other purposes; ask your health care provider or pharmacist if you have questions. COMMON BRAND NAME(S): Adrucil  What should I tell my care team before I take this medication? They need to know if you have any of these conditions: Blood disorders Dihydropyrimidine dehydrogenase (DPD) deficiency Infection, such as chickenpox, cold sores, herpes Kidney disease Liver disease Poor nutrition Recent or ongoing radiation therapy An unusual or allergic reaction to fluorouracil , other medications, foods, dyes, or preservatives If you or your partner are pregnant or trying to get pregnant Breast-feeding How should I use this medication? This medication is injected into a vein. It is administered by your care team in a hospital or clinic setting. Talk to your care team about the use of this medication in children. Special care may be needed. Overdosage: If you think you have taken too much of this medicine contact a poison control center or emergency room at once. NOTE: This medicine is only for you. Do not share this medicine with others. What if I miss a dose? Keep appointments for follow-up doses. It  is important not to miss your dose. Call your care team if you are unable to keep an appointment. What may interact with this medication? Do not take this medication with any of the following: Live virus vaccines This medication may also interact with the following: Medications that treat or prevent blood clots, such as warfarin, enoxaparin , dalteparin This list may not describe all possible interactions. Give your health care provider a list of all the medicines, herbs, non-prescription drugs, or dietary supplements you use. Also tell them if you smoke, drink alcohol, or use illegal drugs. Some items may interact with your medicine. What should I watch for while using this medication? Your condition will be monitored carefully while you are receiving this medication. This medication may make you feel generally unwell. This is not uncommon as chemotherapy can affect healthy cells as well as cancer cells. Report any side effects. Continue your course of treatment even though you feel ill unless your care team  tells you to stop. In some cases, you may be given additional medications to help with side effects. Follow all directions for their use. This medication may increase your risk of getting an infection. Call your care team for advice if you get a fever, chills, sore throat, or other symptoms of a cold or flu. Do not treat yourself. Try to avoid being around people who are sick. This medication may increase your risk to bruise or bleed. Call your care team if you notice any unusual bleeding. Be careful brushing or flossing your teeth or using a toothpick because you may get an infection or bleed more easily. If you have any dental work done, tell your dentist you are receiving this medication. Avoid taking medications that contain aspirin , acetaminophen , ibuprofen , naproxen, or ketoprofen unless instructed by your care team. These medications may hide a fever. Do not treat diarrhea with over the  counter products. Contact your care team if you have diarrhea that lasts more than 2 days or if it is severe and watery. This medication can make you more sensitive to the sun. Keep out of the sun. If you cannot avoid being in the sun, wear protective clothing and sunscreen. Do not use sun lamps, tanning beds, or tanning booths. Talk to your care team if you or your partner wish to become pregnant or think you might be pregnant. This medication can cause serious birth defects if taken during pregnancy and for 3 months after the last dose. A reliable form of contraception is recommended while taking this medication and for 3 months after the last dose. Talk to your care team about effective forms of contraception. Do not father a child while taking this medication and for 3 months after the last dose. Use a condom while having sex during this time period. Do not breastfeed while taking this medication. This medication may cause infertility. Talk to your care team if you are concerned about your fertility. What side effects may I notice from receiving this medication? Side effects that you should report to your care team as soon as possible: Allergic reactions--skin rash, itching, hives, swelling of the face, lips, tongue, or throat Heart attack--pain or tightness in the chest, shoulders, arms, or jaw, nausea, shortness of breath, cold or clammy skin, feeling faint or lightheaded Heart failure--shortness of breath, swelling of the ankles, feet, or hands, sudden weight gain, unusual weakness or fatigue Heart rhythm changes--fast or irregular heartbeat, dizziness, feeling faint or lightheaded, chest pain, trouble breathing High ammonia level--unusual weakness or fatigue, confusion, loss of appetite, nausea, vomiting, seizures Infection--fever, chills, cough, sore throat, wounds that don't heal, pain or trouble when passing urine, general feeling of discomfort or being unwell Low red blood cell  level--unusual weakness or fatigue, dizziness, headache, trouble breathing Pain, tingling, or numbness in the hands or feet, muscle weakness, change in vision, confusion or trouble speaking, loss of balance or coordination, trouble walking, seizures Redness, swelling, and blistering of the skin over hands and feet Severe or prolonged diarrhea Unusual bruising or bleeding Side effects that usually do not require medical attention (report to your care team if they continue or are bothersome): Dry skin Headache Increased tears Nausea Pain, redness, or swelling with sores inside the mouth or throat Sensitivity to light Vomiting This list may not describe all possible side effects. Call your doctor for medical advice about side effects. You may report side effects to FDA at 1-800-FDA-1088. Where should I keep my medication? This medication is given  in a hospital or clinic. It will not be stored at home. NOTE: This sheet is a summary. It may not cover all possible information. If you have questions about this medicine, talk to your doctor, pharmacist, or health care provider.  2024 Elsevier/Gold Standard (2022-02-27 00:00:00)

## 2024-04-28 NOTE — Progress Notes (Signed)
 Dose of 5-fu reduced to 700mg  and 4300mg  due to wt change BSA 1.99

## 2024-04-29 ENCOUNTER — Other Ambulatory Visit: Payer: Self-pay | Admitting: Oncology

## 2024-04-29 ENCOUNTER — Encounter: Payer: Self-pay | Admitting: Oncology

## 2024-04-29 LAB — IGG, IGA, IGM
IgA: 75 mg/dL (ref 61–437)
IgG (Immunoglobin G), Serum: 2635 mg/dL — ABNORMAL HIGH (ref 603–1613)
IgM (Immunoglobulin M), Srm: 66 mg/dL (ref 20–172)

## 2024-04-29 LAB — PROTEIN ELECTROPHORESIS, SERUM
A/G Ratio: 0.9 (ref 0.7–1.7)
Albumin ELP: 3.6 g/dL (ref 2.9–4.4)
Alpha-1-Globulin: 0.3 g/dL (ref 0.0–0.4)
Alpha-2-Globulin: 0.7 g/dL (ref 0.4–1.0)
Beta Globulin: 0.9 g/dL (ref 0.7–1.3)
Gamma Globulin: 2.4 g/dL — ABNORMAL HIGH (ref 0.4–1.8)
Globulin, Total: 4.2 g/dL — ABNORMAL HIGH (ref 2.2–3.9)
M-Spike, %: 1.8 g/dL — ABNORMAL HIGH
Total Protein ELP: 7.8 g/dL (ref 6.0–8.5)

## 2024-04-29 LAB — CEA: CEA: 412 ng/mL — ABNORMAL HIGH (ref 0.0–4.7)

## 2024-04-29 LAB — KAPPA/LAMBDA LIGHT CHAINS
Kappa free light chain: 98.5 mg/L — ABNORMAL HIGH (ref 3.3–19.4)
Kappa, lambda light chain ratio: 1.59 (ref 0.26–1.65)
Lambda free light chains: 61.8 mg/L — ABNORMAL HIGH (ref 5.7–26.3)

## 2024-04-29 MED ORDER — OXYCODONE-ACETAMINOPHEN 5-325 MG PO TABS: 1.0000 | ORAL_TABLET | Freq: Four times a day (QID) | ORAL | 0 refills | Status: DC | PRN

## 2024-04-30 ENCOUNTER — Inpatient Hospital Stay

## 2024-04-30 ENCOUNTER — Other Ambulatory Visit: Payer: Self-pay | Admitting: Oncology

## 2024-04-30 VITALS — BP 93/57 | HR 69 | Temp 96.9°F | Resp 18

## 2024-04-30 DIAGNOSIS — Z5111 Encounter for antineoplastic chemotherapy: Secondary | ICD-10-CM | POA: Diagnosis not present

## 2024-04-30 DIAGNOSIS — C184 Malignant neoplasm of transverse colon: Secondary | ICD-10-CM

## 2024-04-30 MED ORDER — PEGFILGRASTIM-JMDB 6 MG/0.6ML ~~LOC~~ SOSY
6.0000 mg | PREFILLED_SYRINGE | Freq: Once | SUBCUTANEOUS | Status: AC
  Administered 2024-04-30: 6 mg via SUBCUTANEOUS
  Filled 2024-04-30: qty 0.6

## 2024-04-30 MED ORDER — SODIUM CHLORIDE 0.9% FLUSH
10.0000 mL | INTRAVENOUS | Status: DC | PRN
Start: 2024-04-30 — End: 2024-04-30
  Administered 2024-04-30: 10 mL
  Filled 2024-04-30: qty 10

## 2024-04-30 MED ORDER — HEPARIN SOD (PORK) LOCK FLUSH 100 UNIT/ML IV SOLN
500.0000 [IU] | Freq: Once | INTRAVENOUS | Status: AC | PRN
  Administered 2024-04-30: 500 [IU]
  Filled 2024-04-30: qty 5

## 2024-04-30 NOTE — Progress Notes (Signed)
ON PATHWAY REGIMEN - Colorectal  No Change  Continue With Treatment as Ordered.  Original Decision Date/Time: 03/18/2023 13:50     A cycle is every 14 days:     Irinotecan      Leucovorin      Fluorouracil      Fluorouracil   **Always confirm dose/schedule in your pharmacy ordering system**  Patient Characteristics: Distant Metastases, Nonsurgical Candidate, Non-KRAS G12C, RAS Mutation Positive/Unknown (BRAF V600 Wild-Type/Unknown), Standard Cytotoxic Therapy, Second Line Standard Cytotoxic Therapy, Bevacizumab Ineligible Tumor Location: Colon Therapeutic Status: Distant Metastases Microsatellite/Mismatch Repair Status: Unknown BRAF Mutation Status: Did Not Order Test KRAS/NRAS Mutation Status: Did Not Order Test Preferred Therapy Approach: Standard Cytotoxic Therapy Standard Cytotoxic Line of Therapy: Second Line Standard Cytotoxic Therapy Bevacizumab Eligibility: Ineligible Intent of Therapy: Non-Curative / Palliative Intent, Discussed with Patient

## 2024-05-01 ENCOUNTER — Encounter: Payer: Self-pay | Admitting: Oncology

## 2024-05-04 ENCOUNTER — Other Ambulatory Visit: Payer: Self-pay | Admitting: *Deleted

## 2024-05-04 DIAGNOSIS — F320 Major depressive disorder, single episode, mild: Secondary | ICD-10-CM | POA: Diagnosis not present

## 2024-05-04 DIAGNOSIS — C184 Malignant neoplasm of transverse colon: Secondary | ICD-10-CM

## 2024-05-05 ENCOUNTER — Encounter: Payer: Self-pay | Admitting: Oncology

## 2024-05-05 ENCOUNTER — Other Ambulatory Visit: Payer: Self-pay | Admitting: *Deleted

## 2024-05-05 ENCOUNTER — Inpatient Hospital Stay

## 2024-05-05 ENCOUNTER — Inpatient Hospital Stay: Attending: Oncology

## 2024-05-05 ENCOUNTER — Inpatient Hospital Stay (HOSPITAL_BASED_OUTPATIENT_CLINIC_OR_DEPARTMENT_OTHER): Admitting: Oncology

## 2024-05-05 VITALS — BP 91/55 | HR 50 | Temp 96.2°F | Resp 16 | Ht 69.0 in | Wt 174.0 lb

## 2024-05-05 DIAGNOSIS — Z5189 Encounter for other specified aftercare: Secondary | ICD-10-CM | POA: Diagnosis not present

## 2024-05-05 DIAGNOSIS — I959 Hypotension, unspecified: Secondary | ICD-10-CM | POA: Diagnosis not present

## 2024-05-05 DIAGNOSIS — Z79631 Long term (current) use of antimetabolite agent: Secondary | ICD-10-CM | POA: Diagnosis not present

## 2024-05-05 DIAGNOSIS — C184 Malignant neoplasm of transverse colon: Secondary | ICD-10-CM | POA: Diagnosis not present

## 2024-05-05 DIAGNOSIS — R5382 Chronic fatigue, unspecified: Secondary | ICD-10-CM | POA: Diagnosis not present

## 2024-05-05 DIAGNOSIS — D649 Anemia, unspecified: Secondary | ICD-10-CM | POA: Diagnosis not present

## 2024-05-05 DIAGNOSIS — G629 Polyneuropathy, unspecified: Secondary | ICD-10-CM | POA: Diagnosis not present

## 2024-05-05 DIAGNOSIS — C61 Malignant neoplasm of prostate: Secondary | ICD-10-CM | POA: Diagnosis not present

## 2024-05-05 DIAGNOSIS — R531 Weakness: Secondary | ICD-10-CM

## 2024-05-05 DIAGNOSIS — E860 Dehydration: Secondary | ICD-10-CM

## 2024-05-05 DIAGNOSIS — D472 Monoclonal gammopathy: Secondary | ICD-10-CM | POA: Diagnosis not present

## 2024-05-05 DIAGNOSIS — K649 Unspecified hemorrhoids: Secondary | ICD-10-CM | POA: Insufficient documentation

## 2024-05-05 DIAGNOSIS — F419 Anxiety disorder, unspecified: Secondary | ICD-10-CM | POA: Insufficient documentation

## 2024-05-05 DIAGNOSIS — C787 Secondary malignant neoplasm of liver and intrahepatic bile duct: Secondary | ICD-10-CM | POA: Diagnosis present

## 2024-05-05 DIAGNOSIS — D696 Thrombocytopenia, unspecified: Secondary | ICD-10-CM | POA: Insufficient documentation

## 2024-05-05 DIAGNOSIS — R197 Diarrhea, unspecified: Secondary | ICD-10-CM | POA: Insufficient documentation

## 2024-05-05 DIAGNOSIS — C189 Malignant neoplasm of colon, unspecified: Secondary | ICD-10-CM | POA: Diagnosis present

## 2024-05-05 DIAGNOSIS — Z79634 Long term (current) use of topoisomerase inhibitor: Secondary | ICD-10-CM | POA: Insufficient documentation

## 2024-05-05 DIAGNOSIS — Z5111 Encounter for antineoplastic chemotherapy: Secondary | ICD-10-CM | POA: Insufficient documentation

## 2024-05-05 DIAGNOSIS — Z95828 Presence of other vascular implants and grafts: Secondary | ICD-10-CM

## 2024-05-05 LAB — CBC WITH DIFFERENTIAL/PLATELET
Abs Immature Granulocytes: 0 10*3/uL (ref 0.00–0.07)
Basophils Absolute: 0 10*3/uL (ref 0.0–0.1)
Basophils Relative: 0 %
Eosinophils Absolute: 0 10*3/uL (ref 0.0–0.5)
Eosinophils Relative: 3 %
HCT: 24 % — ABNORMAL LOW (ref 39.0–52.0)
Hemoglobin: 7.8 g/dL — ABNORMAL LOW (ref 13.0–17.0)
Immature Granulocytes: 0 %
Lymphocytes Relative: 17 %
Lymphs Abs: 0.2 10*3/uL — ABNORMAL LOW (ref 0.7–4.0)
MCH: 27.7 pg (ref 26.0–34.0)
MCHC: 32.5 g/dL (ref 30.0–36.0)
MCV: 85.1 fL (ref 80.0–100.0)
Monocytes Absolute: 0.1 10*3/uL (ref 0.1–1.0)
Monocytes Relative: 12 %
Neutro Abs: 0.8 10*3/uL — ABNORMAL LOW (ref 1.7–7.7)
Neutrophils Relative %: 68 %
Platelets: 66 10*3/uL — ABNORMAL LOW (ref 150–400)
RBC: 2.82 MIL/uL — ABNORMAL LOW (ref 4.22–5.81)
RDW: 15.7 % — ABNORMAL HIGH (ref 11.5–15.5)
Smear Review: NORMAL
WBC: 1.2 10*3/uL — ABNORMAL LOW (ref 4.0–10.5)
nRBC: 0 % (ref 0.0–0.2)

## 2024-05-05 LAB — CMP (CANCER CENTER ONLY)
ALT: 16 U/L (ref 0–44)
AST: 27 U/L (ref 15–41)
Albumin: 3.9 g/dL (ref 3.5–5.0)
Alkaline Phosphatase: 256 U/L — ABNORMAL HIGH (ref 38–126)
Anion gap: 11 (ref 5–15)
BUN: 93 mg/dL — ABNORMAL HIGH (ref 8–23)
CO2: 22 mmol/L (ref 22–32)
Calcium: 8.7 mg/dL — ABNORMAL LOW (ref 8.9–10.3)
Chloride: 96 mmol/L — ABNORMAL LOW (ref 98–111)
Creatinine: 3.43 mg/dL — ABNORMAL HIGH (ref 0.61–1.24)
GFR, Estimated: 19 mL/min — ABNORMAL LOW (ref >60)
Glucose, Bld: 187 mg/dL — ABNORMAL HIGH (ref 70–99)
Potassium: 3.9 mmol/L (ref 3.5–5.1)
Sodium: 129 mmol/L — ABNORMAL LOW (ref 135–145)
Total Bilirubin: 1.2 mg/dL (ref 0.0–1.2)
Total Protein: 8.4 g/dL — ABNORMAL HIGH (ref 6.5–8.1)

## 2024-05-05 LAB — SAMPLE TO BLOOD BANK

## 2024-05-05 LAB — PREPARE RBC (CROSSMATCH)

## 2024-05-05 MED ORDER — SODIUM CHLORIDE 0.9 % IV SOLN
Freq: Once | INTRAVENOUS | Status: AC
  Filled 2024-05-05: qty 250

## 2024-05-05 MED ORDER — PROCHLORPERAZINE MALEATE 10 MG PO TABS: 10.0000 mg | ORAL_TABLET | Freq: Four times a day (QID) | ORAL | 0 refills | Status: AC | PRN

## 2024-05-05 MED ORDER — HEPARIN SOD (PORK) LOCK FLUSH 100 UNIT/ML IV SOLN
500.0000 [IU] | Freq: Once | INTRAVENOUS | Status: AC
  Administered 2024-05-05: 500 [IU]
  Filled 2024-05-05: qty 5

## 2024-05-05 MED ORDER — ONDANSETRON HCL 8 MG PO TABS: 8.0000 mg | ORAL_TABLET | Freq: Three times a day (TID) | ORAL | 0 refills | Status: AC | PRN

## 2024-05-05 NOTE — Progress Notes (Signed)
 [facility]  Telephone:([phone]) [phone] Fax:(336) [phone]  ID: Chris George OB: May 21, 1956  MR#: [id number]  RDW#:[id number]  Patient Care Team: Jeffie Cheryl FORBES, MD as PCP - General (Family Medicine) [name], MD as Consulting Physician (Oncology) [name], MD as Referring Physician (Neurology) Marcelino Gales, MD as Consulting Physician (Nephrology) Jama Margery ORN, MD as Referring Physician (Cardiology) Lenn Aran, MD as Consulting Physician (Radiation Oncology)  CHIEF COMPLAINT: Recurrent colon cancer, smoldering myeloma, prostate cancer.  INTERVAL HISTORY: Patient returns to clinic today for laboratory work, further evaluation, and blood transfusion after reinitiating treatment with FOLFIRI.  He had a difficult week with a poor appetite and nausea, but states over the past 24 hours this has improved.  He continues to have chronic weakness and fatigue.  He has a mild expressive aphasia and a chronic peripheral neuropathy, but no other neurologic complaints.  He denies any recent fevers.  He has no chest pain, cough, shortness of breath, or hemoptysis.  He denies any constipation or diarrhea.  He has no further melena or hematochezia.  He has no urinary complaints.  Patient offers no further specific complaints today.  REVIEW OF SYSTEMS:   Review of Systems  Constitutional:  Positive for malaise/fatigue and weight loss. Negative for fever.  Respiratory: Negative.  Negative for cough, hemoptysis and shortness of breath.   Cardiovascular: Negative.  Negative for chest pain and leg swelling.  Gastrointestinal:  Positive for nausea and vomiting. Negative for blood in stool, diarrhea and melena.  Genitourinary: Negative.  Negative for hematuria.  Musculoskeletal: Negative.  Negative for back pain and joint pain.  Skin: Negative.  Negative for rash.  Neurological:  Positive for tingling, sensory change and weakness. Negative for dizziness, focal  weakness and headaches.  Psychiatric/Behavioral: Negative.  Negative for depression and hallucinations. The patient is not nervous/anxious.     As per HPI. Otherwise, a complete review of systems is negative.  PAST MEDICAL HISTORY: Past Medical History:  Diagnosis Date   A-fib (HCC)    Anemia    Arthritis    ankles   Cancer (HCC)    multiple myeloma   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HCC) 10/26/2019   Cardiomyopathy (HCC)    Chemotherapy-induced peripheral neuropathy (HCC)    CHF (congestive heart failure) (HCC)    Depression    Dysrhythmia    atrial fib   History of CVA (cerebrovascular accident)    HLD (hyperlipidemia)    Hypercholesteremia    Hypertension    Moderate tricuspid insufficiency    Multiple myeloma (HCC)    not being treated right now per pt 11/11/19   Pneumonia    Sleep apnea    No CPAP.  OSA resolved with wt loss.    PAST SURGICAL HISTORY: Past Surgical History:  Procedure Laterality Date   ATRIAL ABLATION SURGERY     CARDIAC SURGERY     A-Fib Ablations   COLONOSCOPY N/A 03/17/2024   Procedure: COLONOSCOPY;  Surgeon: Jinny Carmine, MD;  Location: [facility] ENDOSCOPY;  Service: Endoscopy;  Laterality: N/A;   COLONOSCOPY WITH PROPOFOL  N/A 10/26/2019   Procedure: COLONOSCOPY WITH BIOPSY;  Surgeon: Jinny Carmine, MD;  Location: [facility] SURGERY CNTR;  Service: Endoscopy;  Laterality: N/A;   COLONOSCOPY WITH PROPOFOL  N/A 09/03/2022   Procedure: COLONOSCOPY WITH PROPOFOL ;  Surgeon: Jinny Carmine, MD;  Location: [facility] SURGERY CNTR;  Service: Endoscopy;  Laterality: N/A;   ESOPHAGOGASTRODUODENOSCOPY N/A 03/17/2024   Procedure: GI Bleeding;  Surgeon: Jinny Carmine, MD;  Location: [facility]  ENDOSCOPY;  Service: Endoscopy;  Laterality: N/A;  GI Bleeding, EGD   ESOPHAGOGASTRODUODENOSCOPY (EGD) WITH PROPOFOL  N/A 10/26/2019   Procedure: ESOPHAGOGASTRODUODENOSCOPY (EGD) WITH BIOPSY;  Surgeon: Jinny Carmine, MD;  Location: [facility] SURGERY CNTR;  Service: Endoscopy;  Laterality: N/A;  sleep  apnea   LAPAROSCOPIC PARTIAL COLECTOMY Right 11/17/2019   Procedure: LAPAROSCOPIC PARTIAL COLECTOMY RIGHT EXTENDED;  Surgeon: Desiderio Schanz, MD;  Location: [facility] ORS;  Service: General;  Laterality: Right;   PORTACATH PLACEMENT N/A 12/28/2019   Procedure: INSERTION PORT-A-CATH Left subclavian;  Surgeon: Desiderio Schanz, MD;  Location: [facility] ORS;  Service: General;  Laterality: N/A;    FAMILY HISTORY: Family History  Problem Relation Age of Onset   Healthy Mother     ADVANCED DIRECTIVES (Y/N):  N  HEALTH MAINTENANCE: Social History   Tobacco Use   Smoking status: Never    Passive exposure: Never   Smokeless tobacco: Never  Vaping Use   Vaping status: Never Used  Substance Use Topics   Alcohol use: Not Currently   Drug use: Never     Colonoscopy:  PAP:  Bone density:  Lipid panel:  No Known Allergies  Current Outpatient Medications  Medication Sig Dispense Refill   allopurinol  (ZYLOPRIM ) 100 MG tablet Take 200 mg by mouth daily.     apixaban (ELIQUIS) 5 MG TABS tablet Take 5 mg by mouth 2 (two) times daily.     atorvastatin  (LIPITOR) 80 MG tablet Take 80 mg by mouth daily.     bumetanide  (BUMEX ) 2 MG tablet Take 2 mg by mouth 2 (two) times daily.     colchicine  0.6 MG tablet Take 1 tablet (0.6 mg total) by mouth daily as needed. Until flare resolves.     lidocaine -prilocaine  (EMLA ) cream Apply 1 Application topically daily.     magnesium  chloride (SLOW-MAG) 64 MG TBEC SR tablet Take 1 tablet (64 mg total) by mouth daily. 60 tablet 1   ondansetron  (ZOFRAN ) 8 MG tablet Take 1 tablet (8 mg total) by mouth every 8 (eight) hours as needed for nausea or vomiting. 20 tablet 0   oxyCODONE -acetaminophen  (PERCOCET/ROXICET) 5-325 MG tablet Take 1-2 tablets by mouth every 6 (six) hours as needed for severe pain (pain score 7-10). 60 tablet 0   prochlorperazine  (COMPAZINE ) 10 MG tablet Take 1 tablet (10 mg total) by mouth every 6 (six) hours as needed for nausea or vomiting. 30 tablet 0    spironolactone (ALDACTONE) 25 MG tablet Take 1 tablet by mouth every morning.     tamsulosin  (FLOMAX ) 0.4 MG CAPS capsule Take 1 capsule (0.4 mg total) by mouth daily after supper. 30 capsule 6   No current facility-administered medications for this visit.   Facility-Administered Medications Ordered in Other Visits  Medication Dose Route Frequency Provider Last Rate Last Admin   heparin  lock flush 100 unit/mL  500 Units Intravenous Once [place] Joshi J, MD        OBJECTIVE: Vitals:   05/05/24 0934  BP: (!) 91/55  Pulse: (!) 50  Resp: 16  Temp: (!) 96.2 F (35.7 C)  SpO2: 100%       Body mass index is 25.7 kg/m.    ECOG FS:2 - Symptomatic, <50% confined to bed  General: Well-developed, well-nourished, no acute distress. Eyes: Pink conjunctiva, anicteric sclera. HEENT: Normocephalic, moist mucous membranes. Lungs: No audible wheezing or coughing. Heart: Regular rate and rhythm. Abdomen: Soft, nontender, no obvious distention. Musculoskeletal: No edema, cyanosis, or clubbing. Neuro: Alert, answering all questions appropriately. Cranial nerves grossly intact.  Skin: No rashes or petechiae noted. Psych: Normal affect.  LAB RESULTS:  Lab Results  Component Value Date   NA 129 (L) 05/05/2024   K 3.9 05/05/2024   CL 96 (L) 05/05/2024   CO2 22 05/05/2024   GLUCOSE 187 (H) 05/05/2024   BUN 93 (H) 05/05/2024   CREATININE 3.43 (H) 05/05/2024   CALCIUM  8.7 (L) 05/05/2024   PROT 8.4 (H) 05/05/2024   ALBUMIN 3.9 05/05/2024   AST 27 05/05/2024   ALT 16 05/05/2024   ALKPHOS 256 (H) 05/05/2024   BILITOT 1.2 05/05/2024   GFRNONAA 19 (L) 05/05/2024   GFRAA >60 07/29/2020    Lab Results  Component Value Date   WBC 1.2 (L) 05/05/2024   NEUTROABS 0.8 (L) 05/05/2024   HGB 7.8 (L) 05/05/2024   HCT 24.0 (L) 05/05/2024   MCV 85.1 05/05/2024   PLT 66 (L) 05/05/2024   Lab Results  Component Value Date   IRON 79 08/13/2023   TIBC 234 (L) 08/13/2023   IRONPCTSAT 34  08/13/2023   Lab Results  Component Value Date   FERRITIN 686 (H) 09/03/2023     STUDIES: NM PET Image Restag (PS) Skull Base To Thigh Result Date: 04/15/2024 CLINICAL DATA:  Subsequent treatment strategy for colon cancer. History of prostate cancer and lymphoma as well. EXAM: NUCLEAR MEDICINE PET SKULL BASE TO THIGH TECHNIQUE: 9.98 mCi F-18 FDG was injected intravenously. Full-ring PET imaging was performed from the skull base to thigh after the radiotracer. CT data was obtained and used for attenuation correction and anatomic localization. Fasting blood glucose: 98 mg/dl COMPARISON:  PET-CT 97/75/7974 and older FINDINGS: Mediastinal blood pool activity: SUV max 2.4 Liver activity: SUV max 2.3 NECK: No specific abnormal uptake seen in the neck including along lymph node change of the submandibular, posterior triangle or internal jugular region. Near symmetric uptake of the visualized intracranial compartment. Incidental CT findings: The parotid glands, submandibular glands are unremarkable. Small thyroid  gland. Visualized paranasal sinuses and mastoid air cells are clear. There is significant streak artifact related to the patient's dental hardware. Carotid vascular calcifications are seen. CHEST: No abnormal radiotracer uptake above blood pool in the axillary regions, hilum or mediastinum. No abnormal lung uptake seen at this time. Right paraspinal physiologic muscle uptake is noted. Incidental CT findings: Once again the heart is severely enlarged. No pericardial effusion. Coronary artery calcifications are seen. Bovine type aortic arch. Scattered atherosclerotic plaque. Left IJ chest port identified with tip extending to the central SVC above the right atrium. Slightly patulous thoracic esophagus. Gynecomastia. There is some breathing motion. Few areas of dependent atelectasis or scarring. No consolidation, pneumothorax or effusion. There is a punctate nodule right lower lobe juxtapleural series 6,  image 74 measuring 2-3 mm, unchanged from previous when adjusted for technique. Similar nodules elsewhere in the right lower lobe are also stable. These are stable since at least April 2023 and are calcified on older examinations. No specific imaging follow-up. ABDOMEN/PELVIS: On today's examination there is some new small areas of increased uptake in the liver. Example segment 7 on image 81 has a focus of uptake of 4.5 maximum SUV value. Focus more anteriorly towards segment 4 maximum SUV of 4.2. These are small and scattered with additional areas in left hepatic lobe as well segment 2/3. At least 7 lesions are suggested and would be worrisome for developing liver metastases. The liver itself is nodular in contour on the CT scan. Recommend additional workup such as dynamic MRI, possibly with Eovist. There  is diffuse uptake along the stomach above other areas of bowel. The stomach is decompressed but there is diffuse wall thickening suggested. Please correlate with symptoms and further workup when clinically appropriate. Amorphous retroperitoneal soft tissue was seen previously, centered retrocaval. On the prior this had maximum SUV value of 8.4 and is measured at 3.5 x 1.8 cm. Area today when measured in a similar fashion for continuity on image 104 of the CT would measure 3.5 x 2.0 cm. There is also adjacent stranding. Uptake today of this area has maximum SUV of 6.7, slightly decreased. However there is a new hypermetabolic lymph node more superiorly with maximum SUV of 12.4. This is retrocrural on image 90 and a node in this location measures 11 x 16 mm. On the prior node would have measured 11 by 7 mm. Otherwise there is physiologic distribution radiotracer along the parenchymal organs, bowel and renal collecting systems. The uptake along the left hemiscrotum with hydrocele is decreased today compared to previous. Please correlate clinical findings. Incidental CT findings: Nodular appearance to the liver.  Spleen is borderline enlarged at 12.8 cm. Mild mesenteric stranding. Scattered vascular calcifications. Mild renal atrophy. No renal or ureteral stones identified. Enlarged prostate identified. There is some wall thickening along the urinary bladder. Diverticula and trabeculation as well. Large bowel has a normal course and caliber. Surgical changes of right hemicolectomy. Scattered stool. Adrenal glands and pancreas are grossly preserved. Gallbladder is present. SKELETON: No specific pathologic abnormal uptake along the visualized osseous structures. Again decreased uptake along the lumbar spine is noted. Please correlate with previous intervention Incidental CT findings: Scattered degenerative changes. IMPRESSION: Areas of progression of disease. There are several small hypermetabolic areas in the liver worrisome for liver metastases. Confirmatory study with MRI with and without contrast may be of some benefit such as with Eovist. Retroperitoneal focal ill-defined soft tissue mass on the previous examination is similar in size to the previous but has slightly less uptake overall. However there is a new retrocrural hypermetabolic lymph node consistent with a new area of disease. Uptake with edema and fluid in the scrotum is decreased today compared to previous. Please correlate with history. Electronically Signed   By: Ranell Bring M.D.   On: 04/15/2024 19:07      ONCOLOGY HISTORY: Patient completed cycle 9 of adjuvant FOLFOX on June 03, 2020.  Given his difficulties with treatment and declining performance status, treatment was discontinued altogether. Colonoscopy on September 03, 2022 did not reveal any significant pathology and recommendation was to repeat in 3 years.    ASSESSMENT: Recurrent colon cancer, smoldering myeloma, prostate cancer.  PLAN:    Recurrent colon cancer: PET scan results from December 30, 2023 reviewed independently with progressive disease of hypermetabolic retrocaval mass, but  no other evidence of metastatic disease.  Patient has completed SBRT to this lesion.  Repeat PET scan on April 14, 2024 reviewed independently with continued progression of disease now with evidence of liver metastasis.  Patient CEA remains stable, but increased at 352.  Patient reinitiated FOLFIRI last week.  Return to clinic in tomorrow for blood transfusion and then in 1 week for further evaluation and consideration of cycle 11 of FOLFIRI.   Prostate cancer: Gleason's 3+4.  Patient's PSA increased and peaked at 39.34, but since completing treatment it continues to trend down.  His most recent result is 1.61.  Nuclear medicine bone scan on December 13, 2022 was reported as negative.  PSMA PET per radiation oncology from Mar 21, 2023  did not reveal metastatic disease.  Patient completed XRT for local control disease on May 30, 2023.  Can consider Eligard in the future.  Continue follow-up with urology as indicated.   Smoldering myeloma: Chronic and unchanged.  Patient was initially diagnosed and treated in [place], [place]  and treated with single agent Velcade in approximately 2013.  He declined maintenance treatment or referral for bone marrow transplant.  His M spike has ranged from 1.1-2.0 since July 2020.  His most recent result is 1.8.  Over the same timeframe his IgG component has ranged from [phone].  His most recent result is 2635.  Finally, his kappa free light chains have ranged from 24.8 -75.3.  His most recent result was elevated at 98.5, but his lambda free light chains are also elevated.  His kappa/lambda free light chain ratio was within normal limits.  Nuclear med bone scan as above.  His most recent bone marrow biopsy completed on May 28, 2019 revealed only 10 to 15% plasma cells with no clonality reported.  Cytogenetics were also reported as normal.  Repeat laboratory work is pending at time of dictation.   Anemia: Hemoglobin 7.8 today.  Return to clinic tomorrow for 1 unit of packed red  blood cells.   Neutropenia: ANC 0.8 today.  Consider reinitiating Udenyca  with his treatments.  Continue with dose reduced FOLFIRI as above.  Thrombocytopenia: Platelets decreased to 66.  Monitor.   Renal insufficiency: GFR stable at 19.  Neither irinotecan  or 5-FU need to be dosed reduced in renal insufficiency.  Continue follow-up with nephrology as scheduled.  Patient will receive 1 L of fluids over 2 hours today. Peripheral neuropathy: Chronic and unchanged.  Patient reports gabapentin  did not help.  Continue Lyrica  50 mg daily and was previously given a referral to neurology.   Hypotension: Chronic and unchanged.  1 L of fluids as above. Coping/anxiety: Patient was previously given a referral to [facility].  CHF exacerbation/edema: Resolved.  Patient was recently at [facility] for nearly a month.  Continue follow-up with cardiology and nephrology.  Monitor closely with fluid infusion today and blood transfusion tomorrow.   Patient expressed understanding and was in agreement with this plan. He also understands that He can call clinic at any time with any questions, concerns, or complaints.    Evalene JINNY Reusing, MD   05/05/2024 12:34 PM

## 2024-05-05 NOTE — Progress Notes (Signed)
 Has had nausea all week with some vomiting. Appetite has been very minimal. Does not have any nausea medication. Wants to talk about adding nausea meds prior to treatment.

## 2024-05-06 ENCOUNTER — Encounter: Payer: Self-pay | Admitting: Oncology

## 2024-05-06 ENCOUNTER — Inpatient Hospital Stay

## 2024-05-06 DIAGNOSIS — Z5111 Encounter for antineoplastic chemotherapy: Secondary | ICD-10-CM | POA: Diagnosis not present

## 2024-05-06 DIAGNOSIS — C184 Malignant neoplasm of transverse colon: Secondary | ICD-10-CM

## 2024-05-06 MED ORDER — HEPARIN SOD (PORK) LOCK FLUSH 100 UNIT/ML IV SOLN
500.0000 [IU] | Freq: Every day | INTRAVENOUS | Status: AC | PRN
  Administered 2024-05-06: 500 [IU]
  Filled 2024-05-06: qty 5

## 2024-05-06 MED ORDER — SODIUM CHLORIDE 0.9% IV SOLUTION
250.0000 mL | INTRAVENOUS | Status: DC
  Administered 2024-05-06: 250 mL via INTRAVENOUS
  Filled 2024-05-06: qty 250

## 2024-05-06 MED ORDER — ACETAMINOPHEN 325 MG PO TABS
650.0000 mg | ORAL_TABLET | Freq: Once | ORAL | Status: AC
  Administered 2024-05-06: 650 mg via ORAL
  Filled 2024-05-06: qty 2

## 2024-05-06 MED ORDER — DIPHENHYDRAMINE HCL 50 MG/ML IJ SOLN
25.0000 mg | Freq: Once | INTRAMUSCULAR | Status: DC
  Filled 2024-05-06: qty 1

## 2024-05-06 MED ORDER — SODIUM CHLORIDE 0.9% FLUSH
3.0000 mL | INTRAVENOUS | Status: DC | PRN
  Filled 2024-05-06: qty 3

## 2024-05-07 ENCOUNTER — Encounter: Payer: Self-pay | Admitting: Oncology

## 2024-05-07 LAB — TYPE AND SCREEN
ABO/RH(D): O POS
Antibody Screen: NEGATIVE
Unit division: 0

## 2024-05-07 LAB — BPAM RBC
Blood Product Expiration Date: 202507172359
ISSUE DATE / TIME: 202507020854
Unit Type and Rh: 202507172359

## 2024-05-11 ENCOUNTER — Encounter: Payer: Self-pay | Admitting: Oncology

## 2024-05-12 ENCOUNTER — Inpatient Hospital Stay

## 2024-05-12 ENCOUNTER — Encounter: Payer: Self-pay | Admitting: Oncology

## 2024-05-12 ENCOUNTER — Other Ambulatory Visit: Payer: Self-pay | Admitting: *Deleted

## 2024-05-12 ENCOUNTER — Ambulatory Visit: Admitting: Oncology

## 2024-05-12 ENCOUNTER — Ambulatory Visit

## 2024-05-12 ENCOUNTER — Other Ambulatory Visit: Payer: Self-pay | Admitting: Oncology

## 2024-05-12 ENCOUNTER — Other Ambulatory Visit

## 2024-05-12 ENCOUNTER — Inpatient Hospital Stay (HOSPITAL_BASED_OUTPATIENT_CLINIC_OR_DEPARTMENT_OTHER): Admitting: Oncology

## 2024-05-12 ENCOUNTER — Telehealth: Payer: Self-pay

## 2024-05-12 VITALS — BP 83/50 | HR 53

## 2024-05-12 VITALS — BP 91/50 | HR 53 | Temp 97.2°F | Resp 16 | Wt 178.0 lb

## 2024-05-12 DIAGNOSIS — C184 Malignant neoplasm of transverse colon: Secondary | ICD-10-CM | POA: Diagnosis not present

## 2024-05-12 DIAGNOSIS — Z5111 Encounter for antineoplastic chemotherapy: Secondary | ICD-10-CM | POA: Diagnosis not present

## 2024-05-12 DIAGNOSIS — C61 Malignant neoplasm of prostate: Secondary | ICD-10-CM

## 2024-05-12 LAB — CMP (CANCER CENTER ONLY)
ALT: 22 U/L (ref 0–44)
AST: 27 U/L (ref 15–41)
Albumin: 3.7 g/dL (ref 3.5–5.0)
Alkaline Phosphatase: 255 U/L — ABNORMAL HIGH (ref 38–126)
Anion gap: 8 (ref 5–15)
BUN: 83 mg/dL — ABNORMAL HIGH (ref 8–23)
CO2: 23 mmol/L (ref 22–32)
Calcium: 9.4 mg/dL (ref 8.9–10.3)
Chloride: 102 mmol/L (ref 98–111)
Creatinine: 4.01 mg/dL — ABNORMAL HIGH (ref 0.61–1.24)
GFR, Estimated: 15 mL/min — ABNORMAL LOW (ref >60)
Glucose, Bld: 133 mg/dL — ABNORMAL HIGH (ref 70–99)
Potassium: 3.9 mmol/L (ref 3.5–5.1)
Sodium: 133 mmol/L — ABNORMAL LOW (ref 135–145)
Total Bilirubin: 0.7 mg/dL (ref 0.0–1.2)
Total Protein: 8.2 g/dL — ABNORMAL HIGH (ref 6.5–8.1)

## 2024-05-12 LAB — CBC WITH DIFFERENTIAL (CANCER CENTER ONLY)
Abs Immature Granulocytes: 0.34 K/uL — ABNORMAL HIGH (ref 0.00–0.07)
Basophils Absolute: 0 K/uL (ref 0.0–0.1)
Basophils Relative: 0 %
Eosinophils Absolute: 0.1 K/uL (ref 0.0–0.5)
Eosinophils Relative: 1 %
HCT: 23.6 % — ABNORMAL LOW (ref 39.0–52.0)
Hemoglobin: 7.5 g/dL — ABNORMAL LOW (ref 13.0–17.0)
Immature Granulocytes: 6 %
Lymphocytes Relative: 8 %
Lymphs Abs: 0.5 K/uL — ABNORMAL LOW (ref 0.7–4.0)
MCH: 27.5 pg (ref 26.0–34.0)
MCHC: 31.8 g/dL (ref 30.0–36.0)
MCV: 86.4 fL (ref 80.0–100.0)
Monocytes Absolute: 0.4 K/uL (ref 0.1–1.0)
Monocytes Relative: 8 %
Neutro Abs: 4.2 K/uL (ref 1.7–7.7)
Neutrophils Relative %: 77 %
Platelet Count: 90 K/uL — ABNORMAL LOW (ref 150–400)
RBC: 2.73 MIL/uL — ABNORMAL LOW (ref 4.22–5.81)
RDW: 16.1 % — ABNORMAL HIGH (ref 11.5–15.5)
Smear Review: NORMAL
WBC Count: 5.5 K/uL (ref 4.0–10.5)
nRBC: 0.4 % — ABNORMAL HIGH (ref 0.0–0.2)

## 2024-05-12 LAB — PSA: Prostatic Specific Antigen: 1.65 ng/mL (ref 0.00–4.00)

## 2024-05-12 LAB — MAGNESIUM: Magnesium: 1.9 mg/dL (ref 1.7–2.4)

## 2024-05-12 MED ORDER — ATROPINE SULFATE 1 MG/ML IV SOLN
0.5000 mg | Freq: Once | INTRAVENOUS | Status: AC | PRN
  Administered 2024-05-12: 0.5 mg via INTRAVENOUS
  Filled 2024-05-12: qty 1

## 2024-05-12 MED ORDER — SODIUM CHLORIDE 0.9 % IV SOLN
360.0000 mg/m2 | Freq: Once | INTRAVENOUS | Status: AC
  Administered 2024-05-12: 716 mg via INTRAVENOUS
  Filled 2024-05-12: qty 24.8

## 2024-05-12 MED ORDER — SODIUM CHLORIDE 0.9 % IV SOLN
160.0000 mg/m2 | Freq: Once | INTRAVENOUS | Status: AC
  Administered 2024-05-12: 300 mg via INTRAVENOUS
  Filled 2024-05-12: qty 15

## 2024-05-12 MED ORDER — FLUOROURACIL CHEMO INJECTION 2.5 GM/50ML
360.0000 mg/m2 | Freq: Once | INTRAVENOUS | Status: AC
  Administered 2024-05-12: 700 mg via INTRAVENOUS
  Filled 2024-05-12: qty 14

## 2024-05-12 MED ORDER — SODIUM CHLORIDE 0.9 % IV SOLN
INTRAVENOUS | Status: DC
  Filled 2024-05-12: qty 250

## 2024-05-12 MED ORDER — DEXAMETHASONE SODIUM PHOSPHATE 10 MG/ML IJ SOLN
10.0000 mg | Freq: Once | INTRAMUSCULAR | Status: AC
  Administered 2024-05-12: 10 mg via INTRAVENOUS
  Filled 2024-05-12: qty 1

## 2024-05-12 MED ORDER — PALONOSETRON HCL INJECTION 0.25 MG/5ML
0.2500 mg | Freq: Once | INTRAVENOUS | Status: AC
  Administered 2024-05-12: 0.25 mg via INTRAVENOUS
  Filled 2024-05-12: qty 5

## 2024-05-12 MED ORDER — SODIUM CHLORIDE 0.9 % IV SOLN
2160.0000 mg/m2 | INTRAVENOUS | Status: DC
  Administered 2024-05-12: 4300 mg via INTRAVENOUS
  Filled 2024-05-12: qty 86

## 2024-05-12 NOTE — Patient Instructions (Signed)
 [facility] - A DEPT OF [facility]  Discharge Instructions: Thank you for choosing [facility] to provide your oncology and hematology care.  If you have a lab appointment with the Cancer Center, please go directly to the Cancer Center and check in at the registration area.  Wear comfortable clothing and clothing appropriate for easy access to any Portacath or PICC line.   We strive to give you quality time with your provider. You may need to reschedule your appointment if you arrive late (15 or more minutes).  Arriving late affects you and other patients whose appointments are after yours.  Also, if you miss three or more appointments without notifying the office, you may be dismissed from the clinic at the provider's discretion.      For prescription refill requests, have your pharmacy contact our office and allow 72 hours for refills to be completed.    Today you received the following chemotherapy and/or immunotherapy agents Irinotecan , Leucovorin  and Adrucil        To help prevent nausea and vomiting after your treatment, we encourage you to take your nausea medication as directed.  BELOW ARE SYMPTOMS THAT SHOULD BE REPORTED IMMEDIATELY: *FEVER GREATER THAN 100.4 F (38 C) OR HIGHER *CHILLS OR SWEATING *NAUSEA AND VOMITING THAT IS NOT CONTROLLED WITH YOUR NAUSEA MEDICATION *UNUSUAL SHORTNESS OF BREATH *UNUSUAL BRUISING OR BLEEDING *URINARY PROBLEMS (pain or burning when urinating, or frequent urination) *BOWEL PROBLEMS (unusual diarrhea, constipation, pain near the anus) TENDERNESS IN MOUTH AND THROAT WITH OR WITHOUT PRESENCE OF ULCERS (sore throat, sores in mouth, or a toothache) UNUSUAL RASH, SWELLING OR PAIN  UNUSUAL VAGINAL DISCHARGE OR ITCHING   Items with * indicate a potential emergency and should be followed up as soon as possible or go to the Emergency Department if any problems should occur.  Please show the CHEMOTHERAPY  ALERT CARD or IMMUNOTHERAPY ALERT CARD at check-in to the Emergency Department and triage nurse.  Should you have questions after your visit or need to cancel or reschedule your appointment, please contact [facility] - A DEPT OF [facility]  [phone] and follow the prompts.  Office hours are 8:00 a.m. to 4:30 p.m. Monday - Friday. Please note that voicemails left after 4:00 p.m. may not be returned until the following business day.  We are closed weekends and major holidays. You have access to a nurse at all times for urgent questions. Please call the main number to the clinic [phone] and follow the prompts.  For any non-urgent questions, you may also contact your provider using MyChart. We now offer e-Visits for anyone 26 and older to request care online for non-urgent symptoms. For details visit [email or web address].   Also download the MyChart app! Go to the app store, search MyChart, open the app, select [facility], and log in with your MyChart username and password.

## 2024-05-12 NOTE — Progress Notes (Signed)
 [facility]  [phone] [phone]) [phone]  ID: HAVISH PETTIES OB: [birth year]  MR#: [id number]  RDW#:[id number]  Patient Care Team: Jeffie Cheryl FORBES, MD as PCP - General (Family Medicine) [name], MD as Consulting Physician (Oncology) [name], MD as Referring Physician (Neurology) Marcelino Gales, MD as Consulting Physician (Nephrology) Jama Margery ORN, MD as Referring Physician (Cardiology) Lenn Aran, MD as Consulting Physician (Radiation Oncology)  CHIEF COMPLAINT: Recurrent colon cancer, smoldering myeloma, prostate cancer.  INTERVAL HISTORY: Patient returns to clinic today for repeat laboratory, further evaluation, and consideration of FOLFIRI.  He continues to have chronic weakness and fatigue, but otherwise feels well.  He has a mild expressive aphasia and a chronic peripheral neuropathy, but no other neurologic complaints.  He denies any recent fevers.  He has no chest pain, cough, shortness of breath, or hemoptysis.  He denies any constipation or diarrhea.  He has no further melena or hematochezia.  He has no urinary complaints.  Patient offers no further specific complaints today.  REVIEW OF SYSTEMS:   Review of Systems  Constitutional:  Positive for malaise/fatigue and weight loss. Negative for fever.  Respiratory: Negative.  Negative for cough, hemoptysis and shortness of breath.   Cardiovascular: Negative.  Negative for chest pain and leg swelling.  Gastrointestinal: Negative.  Negative for blood in stool, diarrhea, melena, nausea and vomiting.  Genitourinary: Negative.  Negative for hematuria.  Musculoskeletal: Negative.  Negative for back pain and joint pain.  Skin: Negative.  Negative for rash.  Neurological:  Positive for tingling, sensory change and weakness. Negative for dizziness, focal weakness and headaches.  Psychiatric/Behavioral: Negative.  Negative for depression and hallucinations. The patient is not  nervous/anxious.     As per HPI. Otherwise, a complete review of systems is negative.  PAST MEDICAL HISTORY: Past Medical History:  Diagnosis Date   A-fib (HCC)    Anemia    Arthritis    ankles   Cancer (HCC)    multiple myeloma   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HCC) 10/26/2019   Cardiomyopathy (HCC)    Chemotherapy-induced peripheral neuropathy (HCC)    CHF (congestive heart failure) (HCC)    Depression    Dysrhythmia    atrial fib   History of CVA (cerebrovascular accident)    HLD (hyperlipidemia)    Hypercholesteremia    Hypertension    Moderate tricuspid insufficiency    Multiple myeloma (HCC)    not being treated right now per pt 11/11/19   Pneumonia    Sleep apnea    No CPAP.  OSA resolved with wt loss.    PAST SURGICAL HISTORY: Past Surgical History:  Procedure Laterality Date   ATRIAL ABLATION SURGERY     CARDIAC SURGERY     A-Fib Ablations   COLONOSCOPY N/A 03/17/2024   Procedure: COLONOSCOPY;  Surgeon: Jinny Carmine, MD;  Location: [facility] ENDOSCOPY;  Service: Endoscopy;  Laterality: N/A;   COLONOSCOPY WITH PROPOFOL  N/A 10/26/2019   Procedure: COLONOSCOPY WITH BIOPSY;  Surgeon: Jinny Carmine, MD;  Location: [facility] SURGERY CNTR;  Service: Endoscopy;  Laterality: N/A;   COLONOSCOPY WITH PROPOFOL  N/A 09/03/2022   Procedure: COLONOSCOPY WITH PROPOFOL ;  Surgeon: Jinny Carmine, MD;  Location: [name] SURGERY CNTR;  Service: Endoscopy;  Laterality: N/A;   ESOPHAGOGASTRODUODENOSCOPY N/A 03/17/2024   Procedure: GI Bleeding;  Surgeon: Jinny Carmine, MD;  Location: [facility] ENDOSCOPY;  Service: Endoscopy;  Laterality: N/A;  GI Bleeding, EGD   ESOPHAGOGASTRODUODENOSCOPY (EGD) WITH PROPOFOL  N/A 10/26/2019   Procedure: ESOPHAGOGASTRODUODENOSCOPY (EGD)  WITH BIOPSY;  Surgeon: Jinny Carmine, MD;  Location: [facility] SURGERY CNTR;  Service: Endoscopy;  Laterality: N/A;  sleep apnea   LAPAROSCOPIC PARTIAL COLECTOMY Right 11/17/2019   Procedure: LAPAROSCOPIC PARTIAL COLECTOMY RIGHT EXTENDED;  Surgeon:  Desiderio Schanz, MD;  Location: [facility] ORS;  Service: General;  Laterality: Right;   PORTACATH PLACEMENT N/A 12/28/2019   Procedure: INSERTION PORT-A-CATH Left subclavian;  Surgeon: Desiderio Schanz, MD;  Location: [facility] ORS;  Service: General;  Laterality: N/A;    FAMILY HISTORY: Family History  Problem Relation Age of Onset   Healthy Mother     ADVANCED DIRECTIVES (Y/N):  N  HEALTH MAINTENANCE: Social History   Tobacco Use   Smoking status: Never    Passive exposure: Never   Smokeless tobacco: Never  Vaping Use   Vaping status: Never Used  Substance Use Topics   Alcohol use: Not Currently   Drug use: Never     Colonoscopy:  PAP:  Bone density:  Lipid panel:  No Known Allergies  Current Outpatient Medications  Medication Sig Dispense Refill   allopurinol  (ZYLOPRIM ) 100 MG tablet Take 200 mg by mouth daily.     apixaban (ELIQUIS) 5 MG TABS tablet Take 5 mg by mouth 2 (two) times daily.     atorvastatin  (LIPITOR) 80 MG tablet Take 80 mg by mouth daily.     bumetanide  (BUMEX ) 2 MG tablet Take 2 mg by mouth 2 (two) times daily.     colchicine  0.6 MG tablet Take 1 tablet (0.6 mg total) by mouth daily as needed. Until flare resolves.     lidocaine -prilocaine  (EMLA ) cream Apply 1 Application topically daily.     magnesium  chloride (SLOW-MAG) 64 MG TBEC SR tablet Take 1 tablet (64 mg total) by mouth daily. 60 tablet 1   ondansetron  (ZOFRAN ) 8 MG tablet Take 1 tablet (8 mg total) by mouth every 8 (eight) hours as needed for nausea or vomiting. 20 tablet 0   oxyCODONE -acetaminophen  (PERCOCET/ROXICET) 5-325 MG tablet Take 1-2 tablets by mouth every 6 (six) hours as needed for severe pain (pain score 7-10). 60 tablet 0   prochlorperazine  (COMPAZINE ) 10 MG tablet Take 1 tablet (10 mg total) by mouth every 6 (six) hours as needed for nausea or vomiting. 30 tablet 0   spironolactone (ALDACTONE) 25 MG tablet Take 1 tablet by mouth every morning.     tamsulosin  (FLOMAX ) 0.4 MG CAPS capsule Take  1 capsule (0.4 mg total) by mouth daily after supper. 30 capsule 6   No current facility-administered medications for this visit.   Facility-Administered Medications Ordered in Other Visits  Medication Dose Route Frequency Provider Last Rate Last Admin   heparin  lock flush 100 unit/mL  500 Units Intravenous Once Chanley Mcenery J, MD        OBJECTIVE: Vitals:   05/12/24 1138  BP: (!) 91/50  Pulse: (!) 53  Resp: 16  Temp: (!) 97.2 F (36.2 C)  SpO2: 100%       Body mass index is 26.29 kg/m.    ECOG FS:1 - Symptomatic but completely ambulatory  General: Well-developed, well-nourished, no acute distress. Eyes: Pink conjunctiva, anicteric sclera. HEENT: Normocephalic, moist mucous membranes. Lungs: No audible wheezing or coughing. Heart: Regular rate and rhythm. Abdomen: Soft, nontender, no obvious distention. Musculoskeletal: No edema, cyanosis, or clubbing. Neuro: Alert, answering all questions appropriately. Cranial nerves grossly intact. Skin: No rashes or petechiae noted. Psych: Normal affect.  LAB RESULTS:  Lab Results  Component Value Date   NA 133 (L) 05/12/2024  K 3.9 05/12/2024   CL 102 05/12/2024   CO2 23 05/12/2024   GLUCOSE 133 (H) 05/12/2024   BUN 83 (H) 05/12/2024   CREATININE 4.01 (H) 05/12/2024   CALCIUM  9.4 05/12/2024   PROT 8.2 (H) 05/12/2024   ALBUMIN 3.7 05/12/2024   AST 27 05/12/2024   ALT 22 05/12/2024   ALKPHOS 255 (H) 05/12/2024   BILITOT 0.7 05/12/2024   GFRNONAA 15 (L) 05/12/2024   GFRAA >60 07/29/2020    Lab Results  Component Value Date   WBC 5.5 05/12/2024   NEUTROABS 4.2 05/12/2024   HGB 7.5 (L) 05/12/2024   HCT 23.6 (L) 05/12/2024   MCV 86.4 05/12/2024   PLT 90 (L) 05/12/2024   Lab Results  Component Value Date   IRON 79 08/13/2023   TIBC 234 (L) 08/13/2023   IRONPCTSAT 34 08/13/2023   Lab Results  Component Value Date   FERRITIN 686 (H) 09/03/2023     STUDIES: NM PET Image Restag (PS) Skull Base To  Thigh Result Date: 04/15/2024 CLINICAL DATA:  Subsequent treatment strategy for colon cancer. History of prostate cancer and lymphoma as well. EXAM: NUCLEAR MEDICINE PET SKULL BASE TO THIGH TECHNIQUE: 9.98 mCi F-18 FDG was injected intravenously. Full-ring PET imaging was performed from the skull base to thigh after the radiotracer. CT data was obtained and used for attenuation correction and anatomic localization. Fasting blood glucose: 98 mg/dl COMPARISON:  PET-CT 97/75/7974 and older FINDINGS: Mediastinal blood pool activity: SUV max 2.4 Liver activity: SUV max 2.3 NECK: No specific abnormal uptake seen in the neck including along lymph node change of the submandibular, posterior triangle or internal jugular region. Near symmetric uptake of the visualized intracranial compartment. Incidental CT findings: The parotid glands, submandibular glands are unremarkable. Small thyroid  gland. Visualized paranasal sinuses and mastoid air cells are clear. There is significant streak artifact related to the patient's dental hardware. Carotid vascular calcifications are seen. CHEST: No abnormal radiotracer uptake above blood pool in the axillary regions, hilum or mediastinum. No abnormal lung uptake seen at this time. Right paraspinal physiologic muscle uptake is noted. Incidental CT findings: Once again the heart is severely enlarged. No pericardial effusion. Coronary artery calcifications are seen. Bovine type aortic arch. Scattered atherosclerotic plaque. Left IJ chest port identified with tip extending to the central SVC above the right atrium. Slightly patulous thoracic esophagus. Gynecomastia. There is some breathing motion. Few areas of dependent atelectasis or scarring. No consolidation, pneumothorax or effusion. There is a punctate nodule right lower lobe juxtapleural series 6, image 74 measuring 2-3 mm, unchanged from previous when adjusted for technique. Similar nodules elsewhere in the right lower lobe are also  stable. These are stable since at least April 2023 and are calcified on older examinations. No specific imaging follow-up. ABDOMEN/PELVIS: On today's examination there is some new small areas of increased uptake in the liver. Example segment 7 on image 81 has a focus of uptake of 4.5 maximum SUV value. Focus more anteriorly towards segment 4 maximum SUV of 4.2. These are small and scattered with additional areas in left hepatic lobe as well segment 2/3. At least 7 lesions are suggested and would be worrisome for developing liver metastases. The liver itself is nodular in contour on the CT scan. Recommend additional workup such as dynamic MRI, possibly with Eovist. There is diffuse uptake along the stomach above other areas of bowel. The stomach is decompressed but there is diffuse wall thickening suggested. Please correlate with symptoms and further workup when clinically  appropriate. Amorphous retroperitoneal soft tissue was seen previously, centered retrocaval. On the prior this had maximum SUV value of 8.4 and is measured at 3.5 x 1.8 cm. Area today when measured in a similar fashion for continuity on image 104 of the CT would measure 3.5 x 2.0 cm. There is also adjacent stranding. Uptake today of this area has maximum SUV of 6.7, slightly decreased. However there is a new hypermetabolic lymph node more superiorly with maximum SUV of 12.4. This is retrocrural on image 90 and a node in this location measures 11 x 16 mm. On the prior node would have measured 11 by 7 mm. Otherwise there is physiologic distribution radiotracer along the parenchymal organs, bowel and renal collecting systems. The uptake along the left hemiscrotum with hydrocele is decreased today compared to previous. Please correlate clinical findings. Incidental CT findings: Nodular appearance to the liver. Spleen is borderline enlarged at 12.8 cm. Mild mesenteric stranding. Scattered vascular calcifications. Mild renal atrophy. No renal or ureteral  stones identified. Enlarged prostate identified. There is some wall thickening along the urinary bladder. Diverticula and trabeculation as well. Large bowel has a normal course and caliber. Surgical changes of right hemicolectomy. Scattered stool. Adrenal glands and pancreas are grossly preserved. Gallbladder is present. SKELETON: No specific pathologic abnormal uptake along the visualized osseous structures. Again decreased uptake along the lumbar spine is noted. Please correlate with previous intervention Incidental CT findings: Scattered degenerative changes. IMPRESSION: Areas of progression of disease. There are several small hypermetabolic areas in the liver worrisome for liver metastases. Confirmatory study with MRI with and without contrast may be of some benefit such as with Eovist. Retroperitoneal focal ill-defined soft tissue mass on the previous examination is similar in size to the previous but has slightly less uptake overall. However there is a new retrocrural hypermetabolic lymph node consistent with a new area of disease. Uptake with edema and fluid in the scrotum is decreased today compared to previous. Please correlate with history. Electronically Signed   By: Ranell Bring M.D.   On: 04/15/2024 19:07      ONCOLOGY HISTORY: Patient completed cycle 9 of adjuvant FOLFOX on June 03, 2020.  Given his difficulties with treatment and declining performance status, treatment was discontinued altogether. Colonoscopy on September 03, 2022 did not reveal any significant pathology and recommendation was to repeat in 3 years.    ASSESSMENT: Recurrent colon cancer, smoldering myeloma, prostate cancer.  PLAN:    Recurrent colon cancer: PET scan results from December 30, 2023 reviewed independently with progressive disease of hypermetabolic retrocaval mass, but no other evidence of metastatic disease.  Patient has completed SBRT to this lesion.  Repeat PET scan on April 14, 2024 reviewed independently with  continued progression of disease now with evidence of liver metastasis.  CEA overall has trended up and is now 412.  Despite worsening creatinine and thrombocytopenia, patient will proceed with cycle 11 of dose reduced FOLFIRI today.  Return to clinic in 2 days for pump removal, in 1 week for laboratory work and possible blood transfusion, and then in 3 weeks for further evaluation and consideration of cycle 12.   Prostate cancer: Gleason's 3+4.  Patient's PSA increased and peaked at 39.34, but since completing treatment it continues to trend down.  His most recent result is 1.61.  Nuclear medicine bone scan on December 13, 2022 was reported as negative.  PSMA PET per radiation oncology from Mar 21, 2023 did not reveal metastatic disease.  Patient completed XRT for  local control disease on May 30, 2023.  Can consider Eligard in the future.  Continue follow-up with urology as indicated.   Smoldering myeloma: Chronic and unchanged.  Patient was initially diagnosed and treated in [place], [place]  and treated with single agent Velcade in approximately 2013.  He declined maintenance treatment or referral for bone marrow transplant.  His M spike has ranged from 1.1-2.0 since July 2020.  His most recent result is 1.8.  Over the same timeframe his IgG component has ranged from [phone].  His most recent result is 2635.  Finally, his kappa free light chains have ranged from 24.8 -75.3.  His most recent result was elevated at 98.5, but his lambda free light chains are also elevated.  His kappa/lambda free light chain ratio was within normal limits.  Nuclear med bone scan as above.  His most recent bone marrow biopsy completed on May 28, 2019 revealed only 10 to 15% plasma cells with no clonality reported.  Cytogenetics were also reported as normal.  Repeat laboratory work is pending at time of dictation.   Anemia: Hemoglobin 7.5 today.  Proceed with treatment cautiously as above.  Return to clinic in 1 week for  laboratory work and consideration of transfusion.   Neutropenia: Resolved.  Patient does not require Udenyca  with his treatments at this time, but may need treatment in the future.  Continue with dose reduced FOLFIRI as above.  Thrombocytopenia: Platelets improved to 90.  Proceed cautiously with treatment as above. Renal insufficiency: GFR decreased to 15.  Neither irinotecan  or 5-FU need to be dosed reduced in renal insufficiency.  Will arrange follow-up with nephrology in the near future.   Peripheral neuropathy: Chronic and unchanged.  Patient reports gabapentin  did not help.  Continue Lyrica  50 mg daily and was previously given a referral to neurology.   Hypotension: Chronic and unchanged.  Patient is asymptomatic.  Monitor.. Coping/anxiety: Patient was previously given a referral to [facility].  CHF exacerbation/edema: Resolved.  Patient was recently at [facility] for nearly a month.  Continue follow-up with cardiology and nephrology.  Monitor closely with fluid infusion today and blood transfusion tomorrow.   Patient expressed understanding and was in agreement with this plan. He also understands that He can call clinic at any time with any questions, concerns, or complaints.    Evalene JINNY Reusing, MD   05/12/2024 12:25 PM

## 2024-05-12 NOTE — Telephone Encounter (Signed)
 Faxed over recent labs to Dr. Marcelino with [facility] to show elevated Cr of 4.01 and advised per Dr. Jacobo to reach out to patient to get an appointment scheduled soon.

## 2024-05-13 ENCOUNTER — Other Ambulatory Visit

## 2024-05-13 ENCOUNTER — Ambulatory Visit: Admitting: Oncology

## 2024-05-13 LAB — CEA: CEA: 376 ng/mL — ABNORMAL HIGH (ref 0.0–4.7)

## 2024-05-14 ENCOUNTER — Inpatient Hospital Stay

## 2024-05-14 ENCOUNTER — Encounter

## 2024-05-14 VITALS — BP 88/56 | HR 44 | Temp 96.4°F | Resp 18

## 2024-05-14 DIAGNOSIS — Z5111 Encounter for antineoplastic chemotherapy: Secondary | ICD-10-CM | POA: Diagnosis not present

## 2024-05-14 DIAGNOSIS — C184 Malignant neoplasm of transverse colon: Secondary | ICD-10-CM

## 2024-05-14 LAB — PREPARE RBC (CROSSMATCH)

## 2024-05-14 MED ORDER — SODIUM CHLORIDE 0.9% IV SOLUTION
250.0000 mL | INTRAVENOUS | Status: DC
  Administered 2024-05-14: 100 mL via INTRAVENOUS
  Filled 2024-05-14: qty 250

## 2024-05-14 MED ORDER — DIPHENHYDRAMINE HCL 50 MG/ML IJ SOLN
25.0000 mg | Freq: Once | INTRAMUSCULAR | Status: DC
  Filled 2024-05-14: qty 1

## 2024-05-14 MED ORDER — PEGFILGRASTIM-JMDB 6 MG/0.6ML ~~LOC~~ SOSY
6.0000 mg | PREFILLED_SYRINGE | Freq: Once | SUBCUTANEOUS | Status: AC
  Administered 2024-05-14: 6 mg via SUBCUTANEOUS
  Filled 2024-05-14: qty 0.6

## 2024-05-14 MED ORDER — ACETAMINOPHEN 325 MG PO TABS
650.0000 mg | ORAL_TABLET | Freq: Once | ORAL | Status: AC
  Administered 2024-05-14: 325 mg via ORAL
  Filled 2024-05-14: qty 2

## 2024-05-14 MED ORDER — SODIUM CHLORIDE 0.9% FLUSH
10.0000 mL | INTRAVENOUS | Status: DC | PRN
  Administered 2024-05-14: 10 mL
  Filled 2024-05-14: qty 10

## 2024-05-14 MED ORDER — HEPARIN SOD (PORK) LOCK FLUSH 100 UNIT/ML IV SOLN
500.0000 [IU] | Freq: Once | INTRAVENOUS | Status: AC | PRN
  Administered 2024-05-14: 500 [IU]
  Filled 2024-05-14: qty 5

## 2024-05-15 LAB — TYPE AND SCREEN
ABO/RH(D): O POS
Antibody Screen: NEGATIVE
Unit division: 0
Unit division: 0

## 2024-05-15 LAB — BPAM RBC
Blood Product Expiration Date: 202507142359
Blood Product Expiration Date: 202508072359
ISSUE DATE / TIME: 202507092010
ISSUE DATE / TIME: 202507101401
Unit Type and Rh: 5100
Unit Type and Rh: 9500

## 2024-05-18 ENCOUNTER — Inpatient Hospital Stay

## 2024-05-18 ENCOUNTER — Other Ambulatory Visit: Payer: Self-pay

## 2024-05-18 DIAGNOSIS — C61 Malignant neoplasm of prostate: Secondary | ICD-10-CM

## 2024-05-18 DIAGNOSIS — C184 Malignant neoplasm of transverse colon: Secondary | ICD-10-CM

## 2024-05-18 NOTE — Progress Notes (Signed)
 [facility] CSW Progress Note  Clinical Child psychotherapist contacted patient by phone to follow-up on need for community resources.    Interventions: Referred patient to community resources: [employer] card.  Patient also receives food coupons from [employer].  CSW to consult Supervisor regarding eligibility for the [place] fund.       Follow Up Plan:  CSW will follow-up with patient by phone     Chris George Au, LCSW Clinical Social Worker [facility]

## 2024-05-19 ENCOUNTER — Inpatient Hospital Stay

## 2024-05-19 ENCOUNTER — Encounter: Payer: Self-pay | Admitting: Oncology

## 2024-05-19 ENCOUNTER — Inpatient Hospital Stay (HOSPITAL_BASED_OUTPATIENT_CLINIC_OR_DEPARTMENT_OTHER): Admitting: Oncology

## 2024-05-19 VITALS — BP 78/60 | HR 107 | Temp 97.0°F | Resp 16 | Ht 69.0 in | Wt 173.0 lb

## 2024-05-19 DIAGNOSIS — C184 Malignant neoplasm of transverse colon: Secondary | ICD-10-CM

## 2024-05-19 DIAGNOSIS — C61 Malignant neoplasm of prostate: Secondary | ICD-10-CM

## 2024-05-19 DIAGNOSIS — Z5111 Encounter for antineoplastic chemotherapy: Secondary | ICD-10-CM | POA: Diagnosis not present

## 2024-05-19 LAB — CBC WITH DIFFERENTIAL (CANCER CENTER ONLY)
Abs Immature Granulocytes: 0.1 K/uL — ABNORMAL HIGH (ref 0.00–0.07)
Basophils Absolute: 0 K/uL (ref 0.0–0.1)
Basophils Relative: 0 %
Eosinophils Absolute: 0 K/uL (ref 0.0–0.5)
Eosinophils Relative: 1 %
HCT: 24.6 % — ABNORMAL LOW (ref 39.0–52.0)
Hemoglobin: 8.1 g/dL — ABNORMAL LOW (ref 13.0–17.0)
Immature Granulocytes: 6 %
Lymphocytes Relative: 12 %
Lymphs Abs: 0.2 K/uL — ABNORMAL LOW (ref 0.7–4.0)
MCH: 28.1 pg (ref 26.0–34.0)
MCHC: 32.9 g/dL (ref 30.0–36.0)
MCV: 85.4 fL (ref 80.0–100.0)
Monocytes Absolute: 0.1 K/uL (ref 0.1–1.0)
Monocytes Relative: 7 %
Neutro Abs: 1.4 K/uL — ABNORMAL LOW (ref 1.7–7.7)
Neutrophils Relative %: 74 %
Platelet Count: 44 K/uL — ABNORMAL LOW (ref 150–400)
RBC: 2.88 MIL/uL — ABNORMAL LOW (ref 4.22–5.81)
RDW: 16 % — ABNORMAL HIGH (ref 11.5–15.5)
Smear Review: NORMAL
WBC Count: 1.8 K/uL — ABNORMAL LOW (ref 4.0–10.5)
nRBC: 0 % (ref 0.0–0.2)

## 2024-05-19 LAB — CMP (CANCER CENTER ONLY)
ALT: 17 U/L (ref 0–44)
AST: 20 U/L (ref 15–41)
Albumin: 3.9 g/dL (ref 3.5–5.0)
Alkaline Phosphatase: 243 U/L — ABNORMAL HIGH (ref 38–126)
Anion gap: 9 (ref 5–15)
BUN: 98 mg/dL — ABNORMAL HIGH (ref 8–23)
CO2: 21 mmol/L — ABNORMAL LOW (ref 22–32)
Calcium: 8.9 mg/dL (ref 8.9–10.3)
Chloride: 100 mmol/L (ref 98–111)
Creatinine: 3.15 mg/dL — ABNORMAL HIGH (ref 0.61–1.24)
GFR, Estimated: 21 mL/min — ABNORMAL LOW (ref >60)
Glucose, Bld: 95 mg/dL (ref 70–99)
Potassium: 4.8 mmol/L (ref 3.5–5.1)
Sodium: 130 mmol/L — ABNORMAL LOW (ref 135–145)
Total Bilirubin: 1.4 mg/dL — ABNORMAL HIGH (ref 0.0–1.2)
Total Protein: 8 g/dL (ref 6.5–8.1)

## 2024-05-19 LAB — SAMPLE TO BLOOD BANK

## 2024-05-19 NOTE — Progress Notes (Signed)
 Patient has been feeling like he might have Hemorid's due to having constipation, which he has been taking a dose of Mirilax every morning, which he is now having watery stools. He is unable to sleep at night due to the Hemorid pain. He is feeling fatigue, tired and a little down. He is losing weight even though he is eating pretty good.

## 2024-05-19 NOTE — Progress Notes (Signed)
 [facility]  [phone] [phone]) [phone]  ID: Chris George OB: [birth year]  MR#: [id number]  RDW#:[id number]  Patient Care Team: Jeffie Cheryl FORBES, MD as PCP - General (Family Medicine) [name], MD as Consulting Physician (Oncology) [name], MD as Referring Physician (Neurology) Marcelino Gales, MD as Consulting Physician (Nephrology) Jama Margery ORN, MD as Referring Physician (Cardiology) Lenn Aran, MD as Consulting Physician (Radiation Oncology)  CHIEF COMPLAINT: Recurrent colon cancer, smoldering myeloma, prostate cancer.  INTERVAL HISTORY: Patient returns to clinic today for repeat laboratory work and further evaluation.  Chris George received chemotherapy with FOLFIRI 1 week ago.  Chris George continues to have chronic weakness and fatigue.  Chris George had a poor appetite this week with worsening diarrhea and painful hemorrhoids.  Chris George has a mild expressive aphasia and a chronic peripheral neuropathy, but no other neurologic complaints.  Chris George denies any recent fevers.  Chris George has no chest pain, cough, shortness of breath, or hemoptysis. Chris George has no further melena or hematochezia.  Chris George has no urinary complaints.  Patient offers no further specific complaints today.  REVIEW OF SYSTEMS:   Review of Systems  Constitutional:  Positive for malaise/fatigue and weight loss. Negative for fever.  Respiratory: Negative.  Negative for cough, hemoptysis and shortness of breath.   Cardiovascular: Negative.  Negative for chest pain and leg swelling.  Gastrointestinal:  Positive for diarrhea and nausea. Negative for blood in stool, melena and vomiting.  Genitourinary: Negative.  Negative for hematuria.  Musculoskeletal: Negative.  Negative for back pain and joint pain.  Skin: Negative.  Negative for rash.  Neurological:  Positive for tingling, sensory change and weakness. Negative for dizziness, focal weakness and headaches.  Psychiatric/Behavioral: Negative.  Negative for  depression and hallucinations. The patient is not nervous/anxious.   Bilateral  As per HPI. Otherwise, a complete review of systems is negative.  PAST MEDICAL HISTORY: Past Medical History:  Diagnosis Date   A-fib (HCC)    Anemia    Arthritis    ankles   Cancer (HCC)    multiple myeloma   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HCC) 10/26/2019   Cardiomyopathy (HCC)    Chemotherapy-induced peripheral neuropathy (HCC)    CHF (congestive heart failure) (HCC)    Depression    Dysrhythmia    atrial fib   History of CVA (cerebrovascular accident)    HLD (hyperlipidemia)    Hypercholesteremia    Hypertension    Moderate tricuspid insufficiency    Multiple myeloma (HCC)    not being treated right now per pt 11/11/19   Pneumonia    Sleep apnea    No CPAP.  OSA resolved with wt loss.    PAST SURGICAL HISTORY: Past Surgical History:  Procedure Laterality Date   ATRIAL ABLATION SURGERY     CARDIAC SURGERY     A-Fib Ablations   COLONOSCOPY N/A 03/17/2024   Procedure: COLONOSCOPY;  Surgeon: Jinny Carmine, MD;  Location: [facility] ENDOSCOPY;  Service: Endoscopy;  Laterality: N/A;   COLONOSCOPY WITH PROPOFOL  N/A 10/26/2019   Procedure: COLONOSCOPY WITH BIOPSY;  Surgeon: Jinny Carmine, MD;  Location: [facility] SURGERY [facility];  Service: Endoscopy;  Laterality: N/A;   COLONOSCOPY WITH PROPOFOL  N/A 09/03/2022   Procedure: COLONOSCOPY WITH PROPOFOL ;  Surgeon: Jinny Carmine, MD;  Location: [facility] SUR[facility]ERY [facility];  Service: Endoscopy;  Laterality: N/A;   ESOPHAGOGASTRODUODENOSCOPY N/A 03/17/2024   Procedure: GI Bleeding;  Surgeon: Jinny Carmine, MD;  Location: [facility] ENDOSCOPY;  Service: Endoscopy;  Laterality: N/A;  GI Bleeding, EGD  ESOPHAGOGASTRODUODENOSCOPY (EGD) WITH PROPOFOL  N/A 10/26/2019   Procedure: ESOPHAGOGASTRODUODENOSCOPY (EGD) WITH BIOPSY;  Surgeon: Jinny Carmine, MD;  Location: [facility] SURGERY CNTR;  Service: Endoscopy;  Laterality: N/A;  sleep apnea   LAPAROSCOPIC PARTIAL COLECTOMY Right 11/17/2019    Procedure: LAPAROSCOPIC PARTIAL COLECTOMY RIGHT EXTENDED;  Surgeon: Desiderio Schanz, MD;  Location: [facility] ORS;  Service: General;  Laterality: Right;   PORTACATH PLACEMENT N/A 12/28/2019   Procedure: INSERTION PORT-A-CATH Left subclavian;  Surgeon: Desiderio Schanz, MD;  Location: [facility] ORS;  Service: General;  Laterality: N/A;    FAMILY HISTORY: Family History  Problem Relation Age of Onset   Healthy Mother     ADVANCED DIRECTIVES (Y/N):  N  HEALTH MAINTENANCE: Social History   Tobacco Use   Smoking status: Never    Passive exposure: Never   Smokeless tobacco: Never  Vaping Use   Vaping status: Never Used  Substance Use Topics   Alcohol use: Not Currently   Drug use: Never     Colonoscopy:  PAP:  Bone density:  Lipid panel:  No Known Allergies  Current Outpatient Medications  Medication Sig Dispense Refill   allopurinol  (ZYLOPRIM ) 100 MG tablet Take 200 mg by mouth daily.     apixaban (ELIQUIS) 5 MG TABS tablet Take 5 mg by mouth 2 (two) times daily.     atorvastatin  (LIPITOR) 80 MG tablet Take 80 mg by mouth daily.     bumetanide  (BUMEX ) 2 MG tablet Take 2 mg by mouth once a week. Taking one in the morning.     colchicine  0.6 MG tablet Take 1 tablet (0.6 mg total) by mouth daily as needed. Until flare resolves.     lidocaine -prilocaine  (EMLA ) cream Apply 1 Application topically daily.     magnesium  chloride (SLOW-MAG) 64 MG TBEC SR tablet Take 1 tablet (64 mg total) by mouth daily. 60 tablet 1   ondansetron  (ZOFRAN ) 8 MG tablet Take 1 tablet (8 mg total) by mouth every 8 (eight) hours as needed for nausea or vomiting. 20 tablet 0   oxyCODONE -acetaminophen  (PERCOCET/ROXICET) 5-325 MG tablet Take 1-2 tablets by mouth every 6 (six) hours as needed for severe pain (pain score 7-10). 60 tablet 0   prochlorperazine  (COMPAZINE ) 10 MG tablet Take 1 tablet (10 mg total) by mouth every 6 (six) hours as needed for nausea or vomiting. 30 tablet 0   spironolactone (ALDACTONE) 25 MG tablet  Take 1 tablet by mouth every morning.     tamsulosin  (FLOMAX ) 0.4 MG CAPS capsule Take 1 capsule (0.4 mg total) by mouth daily after supper. 30 capsule 6   No current facility-administered medications for this visit.   Facility-Administered Medications Ordered in Other Visits  Medication Dose Route Frequency Provider Last Rate Last Admin   heparin  lock flush 100 unit/mL  500 Units Intravenous Once Quamir Willemsen J, MD        OBJECTIVE: Vitals:   05/19/24 0959  BP: (!) 78/60  Pulse: (!) 107  Resp: 16  Temp: (!) 97 F (36.1 C)  SpO2: 97%       Body mass index is 25.55 kg/m.    ECOG FS:1 - Symptomatic but completely ambulatory  General: Well-developed, well-nourished, no acute distress. Eyes: Pink conjunctiva, anicteric sclera. HEENT: Normocephalic, moist mucous membranes. Lungs: No audible wheezing or coughing. Heart: Regular rate and rhythm. Abdomen: Soft, nontender, no obvious distention. Musculoskeletal: No edema, cyanosis, or clubbing. Neuro: Alert, answering all questions appropriately. Cranial nerves grossly intact. Skin: No rashes or petechiae noted. Psych: Normal affect.  LAB RESULTS:  Lab Results  Component Value Date   NA 130 (L) 05/19/2024   K 4.8 05/19/2024   CL 100 05/19/2024   CO2 21 (L) 05/19/2024   GLUCOSE 95 05/19/2024   BUN 98 (H) 05/19/2024   CREATININE 3.15 (H) 05/19/2024   CALCIUM  8.9 05/19/2024   PROT 8.0 05/19/2024   ALBUMIN 3.9 05/19/2024   AST 20 05/19/2024   ALT 17 05/19/2024   ALKPHOS 243 (H) 05/19/2024   BILITOT 1.4 (H) 05/19/2024   GFRNONAA 21 (L) 05/19/2024   GFRAA >60 07/29/2020    Lab Results  Component Value Date   WBC 1.8 (L) 05/19/2024   NEUTROABS 1.4 (L) 05/19/2024   HGB 8.1 (L) 05/19/2024   HCT 24.6 (L) 05/19/2024   MCV 85.4 05/19/2024   PLT 44 (L) 05/19/2024   Lab Results  Component Value Date   IRON 79 08/13/2023   TIBC 234 (L) 08/13/2023   IRONPCTSAT 34 08/13/2023   Lab Results  Component Value Date    FERRITIN 686 (H) 09/03/2023     STUDIES: No results found.     ONCOLOGY HISTORY: Patient completed cycle 9 of adjuvant FOLFOX on June 03, 2020.  Given his difficulties with treatment and declining performance status, treatment was discontinued altogether. Colonoscopy on September 03, 2022 did not reveal any significant pathology and recommendation was to repeat in 3 years.    ASSESSMENT: Recurrent colon cancer, smoldering myeloma, prostate cancer.  PLAN:    Recurrent colon cancer: PET scan results from December 30, 2023 reviewed independently with progressive disease of hypermetabolic retrocaval mass, but no other evidence of metastatic disease.  Patient has completed SBRT to this lesion.  Repeat PET scan on April 14, 2024 reviewed independently with continued progression of disease now with evidence of liver metastasis.  CEA overall has trended up and is now 412.  Patient received cycle 11 of dose reduced FOLFIRI last week.  Chris George has been instructed to keep his previously scheduled follow-up appointment in 2 weeks for further evaluation and consideration of cycle 12. Prostate cancer: Gleason's 3+4.  Patient's PSA increased and peaked at 39.34, but since completing treatment it continues to trend down.  His most recent result is 1.61.  Nuclear medicine bone scan on December 13, 2022 was reported as negative.  PSMA PET per radiation oncology from Mar 21, 2023 did not reveal metastatic disease.  Patient completed XRT for local control disease on May 30, 2023.  Can consider Eligard in the future.  Continue follow-up with urology as indicated.   Smoldering myeloma: Chronic and unchanged.  Patient was initially diagnosed and treated in [place], [place]  and treated with single agent Velcade in approximately 2013.  Chris George declined maintenance treatment or referral for bone marrow transplant.  His M spike has ranged from 1.1-2.0 since July 2020.  His most recent result is 1.8.  Over the same timeframe his IgG  component has ranged from [phone].  His most recent result is 2635.  Finally, his kappa free light chains have ranged from 24.8 -75.3.  His most recent result was elevated at 98.5, but his lambda free light chains are also elevated.  His kappa/lambda free light chain ratio was within normal limits.  Nuclear med bone scan as above.  His most recent bone marrow biopsy completed on May 28, 2019 revealed only 10 to 15% plasma cells with no clonality reported.  Cytogenetics were also reported as normal.  Repeat laboratory work is pending at time of dictation.  Anemia: Hemoglobin mildly improved 8.1.  Patient declined blood transfusion tomorrow. Neutropenia: ANC is 1.4 today.  Patient does not require Udenyca  with his treatments at this time, but may need treatment in the future.  Continue with dose reduced FOLFIRI as above.  Thrombocytopenia: Platelets decreased to 44.  Secondary to chemotherapy.  Monitor.   Renal insufficiency: GFR mildly improved to 21.  Neither irinotecan  or 5-FU need to be dosed reduced in renal insufficiency.  Appreciate nephrology input.   Peripheral neuropathy: Chronic and unchanged.  Patient reports gabapentin  did not help.  Continue Lyrica  50 mg daily and was previously given a referral to neurology.   Hypotension: Chronic and unchanged.  Patient is asymptomatic.  Monitor. Coping/anxiety: Patient was previously given a referral to [facility].  CHF exacerbation/edema: Resolved.  Patient was recently at [facility] for nearly a month.  Continue follow-up with cardiology and nephrology.  Monitor closely with fluid infusion today and blood transfusion tomorrow. Diarrhea: Continue Imodium as needed. Hemorrhoids: Continue OTC remedies as needed.   Patient expressed understanding and was in agreement with this plan. Chris George also understands that Chris George can call clinic at any time with any questions, concerns, or complaints.    Evalene JINNY Reusing, MD   05/19/2024 1:08 PM

## 2024-05-20 ENCOUNTER — Inpatient Hospital Stay

## 2024-05-21 ENCOUNTER — Telehealth: Payer: Self-pay | Admitting: *Deleted

## 2024-05-21 ENCOUNTER — Ambulatory Visit
Admission: EM | Admit: 2024-05-21 | Discharge: 2024-05-21 | Disposition: A | Discharge status: Home / Self Care | Attending: Family Medicine | Admitting: Family Medicine

## 2024-05-21 DIAGNOSIS — K645 Perianal venous thrombosis: Secondary | ICD-10-CM | POA: Diagnosis present

## 2024-05-21 DIAGNOSIS — D649 Anemia, unspecified: Secondary | ICD-10-CM | POA: Insufficient documentation

## 2024-05-21 DIAGNOSIS — C800 Disseminated malignant neoplasm, unspecified: Secondary | ICD-10-CM | POA: Insufficient documentation

## 2024-05-21 DIAGNOSIS — I9589 Other hypotension: Secondary | ICD-10-CM | POA: Insufficient documentation

## 2024-05-21 DIAGNOSIS — K648 Other hemorrhoids: Secondary | ICD-10-CM | POA: Diagnosis present

## 2024-05-21 LAB — OCCULT BLOOD X 1 CARD TO LAB, STOOL: Fecal Occult Bld: NEGATIVE

## 2024-05-21 LAB — COMPREHENSIVE METABOLIC PANEL WITH GFR
ALT: 19 U/L (ref 0–44)
AST: 21 U/L (ref 15–41)
Albumin: 4 g/dL (ref 3.5–5.0)
Alkaline Phosphatase: 238 U/L — ABNORMAL HIGH (ref 38–126)
Anion gap: 9 (ref 5–15)
BUN: 80 mg/dL — ABNORMAL HIGH (ref 8–23)
CO2: 19 mmol/L — ABNORMAL LOW (ref 22–32)
Calcium: 9 mg/dL (ref 8.9–10.3)
Chloride: 102 mmol/L (ref 98–111)
Creatinine, Ser: 2.78 mg/dL — ABNORMAL HIGH (ref 0.61–1.24)
GFR, Estimated: 24 mL/min — ABNORMAL LOW (ref >60)
Glucose, Bld: 120 mg/dL — ABNORMAL HIGH (ref 70–99)
Potassium: 4.6 mmol/L (ref 3.5–5.1)
Sodium: 130 mmol/L — ABNORMAL LOW (ref 135–145)
Total Bilirubin: 1.2 mg/dL (ref 0.0–1.2)
Total Protein: 8.4 g/dL — ABNORMAL HIGH (ref 6.5–8.1)

## 2024-05-21 LAB — CBC WITH DIFFERENTIAL/PLATELET
Abs Immature Granulocytes: 0.01 K/uL (ref 0.00–0.07)
Basophils Absolute: 0 K/uL (ref 0.0–0.1)
Basophils Relative: 1 %
Eosinophils Absolute: 0 K/uL (ref 0.0–0.5)
Eosinophils Relative: 1 %
HCT: 22.2 % — ABNORMAL LOW (ref 39.0–52.0)
Hemoglobin: 7.4 g/dL — ABNORMAL LOW (ref 13.0–17.0)
Immature Granulocytes: 1 %
Lymphocytes Relative: 17 %
Lymphs Abs: 0.2 K/uL — ABNORMAL LOW (ref 0.7–4.0)
MCH: 27.9 pg (ref 26.0–34.0)
MCHC: 33.3 g/dL (ref 30.0–36.0)
MCV: 83.8 fL (ref 80.0–100.0)
Monocytes Absolute: 0.2 K/uL (ref 0.1–1.0)
Monocytes Relative: 16 %
Neutro Abs: 0.8 K/uL — ABNORMAL LOW (ref 1.7–7.7)
Neutrophils Relative %: 64 %
Platelets: 43 K/uL — ABNORMAL LOW (ref 150–400)
RBC: 2.65 MIL/uL — ABNORMAL LOW (ref 4.22–5.81)
RDW: 16.3 % — ABNORMAL HIGH (ref 11.5–15.5)
Smear Review: NORMAL
WBC: 1.3 K/uL — CL (ref 4.0–10.5)
nRBC: 0 % (ref 0.0–0.2)

## 2024-05-21 MED ORDER — HYDROCORTISONE (PERIANAL) 2.5 % EX CREA: 1.0000 | TOPICAL_CREAM | Freq: Two times a day (BID) | CUTANEOUS | 0 refills | Status: AC

## 2024-05-21 NOTE — ED Provider Notes (Addendum)
 [facility]    CSN: [id number] Arrival date & time: 05/21/24  1714      History   Chief Complaint Chief Complaint  Patient presents with   Hemorrhoids    HPI Chris George is a 68 y.o. male.   HPI  Chris George presents for intermittent rectal pain that started 4 days ago. Has been using Preparation H.  Has history of hemorrhoids and has been having blood in his stool and when he wipes. Denies the toilet bowl turning red. No fever, vomiting, diarrhea, NSAID use or melena. He does take blood thinners for AFIB.  Reports colon, prostate, liver cancer. Had chemotherapy last Tuesday. Has intermittent pain in his rectum. Denies abdominal pain or chest pain.  Feels bad right now.     Past Medical History:  Diagnosis Date   A-fib (HCC)    Anemia    Arthritis    ankles   Cancer (HCC)    multiple myeloma   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HCC) 10/26/2019   Cardiomyopathy (HCC)    Chemotherapy-induced peripheral neuropathy (HCC)    CHF (congestive heart failure) (HCC)    Depression    Dysrhythmia    atrial fib   History of CVA (cerebrovascular accident)    HLD (hyperlipidemia)    Hypercholesteremia    Hypertension    Moderate tricuspid insufficiency    Multiple myeloma (HCC)    not being treated right now per pt 11/11/19   Pneumonia    Sleep apnea    No CPAP.  OSA resolved with wt loss.    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/22/2023   Malignant neoplasm of prostate (HCC) 04/22/2023   Secondary hyperparathyroidism of renal origin (HCC) 04/22/2023    History of CVA (cerebrovascular accident) 04/05/2023   Personal history of colon cancer    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   Diabetes mellitus type 2, controlled, without complications (HCC) 08/07/2021   Neuropathy 08/30/2020   Port-A-Cath in place    Abnormal ECG 11/04/2019   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HCC) 10/27/2019   Goals of care, counseling/discussion 05/18/2019   Multiple myeloma (HCC) 05/17/2019   Anemia 05/12/2019   Arthritis of left ankle 02/05/2018   Cardiomyopathy, idiopathic (HCC) 01/07/2018   Moderate tricuspid insufficiency 01/01/2017   OSA (obstructive sleep apnea) 01/01/2017   Chronic a-fib (HCC) 12/06/2015   Pure hypercholesterolemia 02/09/2015   Anticoagulant long-term use 04/29/2014   Hyperlipidemia 04/29/2014   Hypertension, essential, benign 04/26/2014    Past Surgical History:  Procedure Laterality Date   ATRIAL ABLATION SURGERY     CARDIAC SURGERY     A-Fib Ablations   COLONOSCOPY N/A 03/17/2024   Procedure: COLONOSCOPY;  Surgeon: Jinny Carmine, MD;  Location: [facility] END[facility]COPY;  Service: Endoscopy;  Laterality: N/A;   COLONOSCOPY WITH PROPOFOL  N/A 10/26/2019   Procedure: COLONOSCOPY WITH BIOPSY;  Surgeon: Jinny Carmine, MD;  Location: [facility] SURGERY CNTR;  Service: Endoscopy;  Laterality: N/A;   COLONOSCOPY WITH PROPOFOL  N/A 09/03/2022   Procedure: COLONOSCOPY WITH PROPOFOL ;  Surgeon: Jinny Carmine, MD;  Location: [facility] SURGERY CNTR;  Service: Endoscopy;  Laterality: N/A;  ESOPHAGOGASTRODUODENOSCOPY N/A 03/17/2024   Procedure: GI Bleeding;  Surgeon: Jinny Carmine, MD;  Location: [facility] ENDOSCOPY;  Service: Endoscopy;  Laterality: N/A;  GI Bleeding, EGD   ESOPHAGOGASTRODUODENOSCOPY (EGD) WITH PROPOFOL  N/A 10/26/2019   Procedure: ESOPHAGOGASTRODUODENOSCOPY (EGD) WITH BIOPSY;  Surgeon: Jinny Carmine, MD;  Location: [facility] SURGERY CNTR;  Service: Endoscopy;  Laterality: N/A;  sleep apnea   LAPAROSCOPIC PARTIAL COLECTOMY Right 11/17/2019    Procedure: LAPAROSCOPIC PARTIAL COLECTOMY RIGHT EXTENDED;  Surgeon: Desiderio Schanz, MD;  Location: [facility] ORS;  Service: General;  Laterality: Right;   PORTACATH PLACEMENT N/A 12/28/2019   Procedure: INSERTION PORT-A-CATH Left subclavian;  Surgeon: Desiderio Schanz, MD;  Location: [facility] ORS;  Service: General;  Laterality: N/A;       Home Medications    Prior to Admission medications   Medication Sig Start Date End Date Taking? Authorizing Provider  hydrocortisone  (ANUSOL -HC) 2.5 % rectal cream Place 1 Application rectally 2 (two) times daily. 05/21/24  Yes Sandy Blouch, DO  allopurinol  (ZYLOPRIM ) 100 MG tablet Take 200 mg by mouth daily. 11/01/22   [provider]  apixaban (ELIQUIS) 5 MG TABS tablet Take 5 mg by mouth 2 (two) times daily.    [provider]  atorvastatin  (LIPITOR) 80 MG tablet Take 80 mg by mouth daily.    [provider]  bumetanide  (BUMEX ) 2 MG tablet Take 2 mg by mouth once a week. Taking one in the morning. 12/17/23 12/11/24  [provider]  colchicine  0.6 MG tablet Take 1 tablet (0.6 mg total) by mouth daily as needed. Until flare resolves. 03/17/24   Jens Durand, MD  lidocaine -prilocaine  (EMLA ) cream Apply 1 Application topically daily.    [provider]  magnesium  chloride (SLOW-MAG) 64 MG TBEC SR tablet Take 1 tablet (64 mg total) by mouth daily. 11/08/23   Borders, Fonda SAUNDERS, NP  ondansetron  (ZOFRAN ) 8 MG tablet Take 1 tablet (8 mg total) by mouth every 8 (eight) hours as needed for nausea or vomiting. 05/05/24   [name], MD  oxyCODONE -acetaminophen  (PERCOCET/ROXICET) 5-325 MG tablet Take 1-2 tablets by mouth every 6 (six) hours as needed for severe pain (pain score 7-10). 04/29/24   [name], MD  prochlorperazine  (COMPAZINE ) 10 MG tablet Take 1 tablet (10 mg total) by mouth every 6 (six) hours as needed for nausea or vomiting. 05/05/24   Finnegan, Timothy J, MD  spironolactone (ALDACTONE) 25 MG tablet Take 1  tablet by mouth every morning. 12/17/23 12/11/24  [provider]  tamsulosin  (FLOMAX ) 0.4 MG CAPS capsule Take 1 capsule (0.4 mg total) by mouth daily after supper. 02/04/24   Lenn Aran, MD    Family History Family History  Problem Relation Age of Onset   Healthy Mother     Social History Social History   Tobacco Use   Smoking status: Never    Passive exposure: Never   Smokeless tobacco: Never  Vaping Use   Vaping status: Never Used  Substance Use Topics   Alcohol use: Not Currently   Drug use: Never     Allergies   Patient has no known allergies.   Review of Systems Review of Systems :negative unless otherwise stated in HPI.      Physical Exam Triage Vital Signs ED Triage Vitals  Encounter Vitals Group     BP 05/21/24 1719 (!) 62/50     Girls Systolic BP Percentile --      Girls Diastolic BP Percentile --      Boys Systolic BP Percentile --  Boys Diastolic BP Percentile --      Pulse Rate 05/21/24 1719 (!) 50     Resp 05/21/24 1719 16     Temp 05/21/24 1719 98 F (36.7 C)     Temp Source 05/21/24 1719 Oral     SpO2 05/21/24 1719 100 %     Weight --      Height --      Head Circumference --      Peak Flow --      Pain Score 05/21/24 1722 6     Pain Loc --      Pain Education --      Exclude from Growth Chart --    No data found.  Updated Vital Signs BP (!) 85/50 (BP Location: Right Arm)   Pulse (!) 52   Temp 98 F (36.7 C) (Oral)   Resp 16   SpO2 100%   Visual Acuity Right Eye Distance:   Left Eye Distance:   Bilateral Distance:    Right Eye Near:   Left Eye Near:    Bilateral Near:     Physical Exam  GEN: chronically ill appearing male, in no acute distress  CV: regular rate and rhythm RESP: no increased work of breathing, clear to ascultation bilaterally ABD: Bowel sounds present. Soft, non-tender, non-distended.  No guarding, no rebound, no appreciable hepatosplenomegaly SKIN: warm, dry NEURO: alert, moves all  extremities appropriately   UC Treatments / Results  Labs (all labs ordered are listed, but only abnormal results are displayed) Labs Reviewed  COMPREHENSIVE METABOLIC PANEL WITH GFR - Abnormal; Notable for the following components:      Result Value   Sodium 130 (*)    CO2 19 (*)    Glucose, Bld 120 (*)    BUN 80 (*)    Creatinine, Ser 2.78 (*)    Total Protein 8.4 (*)    Alkaline Phosphatase 238 (*)    GFR, Estimated 24 (*)    All other components within normal limits  CBC WITH DIFFERENTIAL/PLATELET - Abnormal; Notable for the following components:   WBC 1.3 (*)    RBC 2.65 (*)    Hemoglobin 7.4 (*)    HCT 22.2 (*)    RDW 16.3 (*)    Platelets 43 (*)    Neutro Abs 0.8 (*)    Lymphs Abs 0.2 (*)    All other components within normal limits  OCCULT BLOOD X 1 CARD TO LAB, STOOL    EKG  If EKG performed, see my interpretation and MDM section  Radiology No results found.   Procedures Procedures (including critical care time)  Medications Ordered in UC Medications - No data to display  Initial Impression / Assessment and Plan / UC Course  I have reviewed the triage vital signs and the nursing notes.  Pertinent labs & imaging results that were available during my care of the patient were reviewed by me and considered in my medical decision making (see chart for details).       Patient is a  68 y.o. malewith history multiple cancers including colon s/p partial resection, CKD, AFIB,  who presents after having rectal pain with BRBPR.    Discussed ED evaluation given his bleeding with his chronic health occasions and active cancer.  Patient would like to avoid the emergency department at this time.   Overall, patient is non-toxic appearing, well-hydrated, and in no acute distress.  He is hypotensive and bradycardic but these are not new or  similar to his visit a couple days ago at his oncology's office..  Ronnieis afebrile.  Exam is not concerning for an acute abdomen.   He does have a swollen external hemorrhoid with rectal tenderness.  Surprisingly, fecal Hemoccult is negative.  CBC and CMP obtained.  CMP showing stable hyponatremia-hypochloremia, elevated serum creatinine but better than his baseline, stable proteinemia, stable elevated alkaline phosphatase.  His CBC showed leukopenia status post recent chemotherapy, normocytic anemia that is slightly lower than 2 days ago at 7.4 from 8.1; thrombocytopenia is stable, neutropenia and lymphopenia is stable status post chemotherapy.  Overall his lab work is stable compared to his previous.  For his hemorrhoids, prescribed Anusol .  Advised him to use stool softeners to make sure his stools are soft to prevent rectal pain with defecation.  Recommended patient follow-up with his oncologist tomorrow to get a blood transfusion that was offered to him per the notes.  States he will call them as I think this will also get his blood pressure back into a normal range and make him feel somewhat better.  Strict ED precautions given and he voiced understanding. Follow-up and return precautions given. Discussed MDM, treatment plan and plan for follow-up with patient who agrees with plan.    Final Clinical Impressions(s) / UC Diagnoses   Final diagnoses:  Symptomatic anemia  Internal and external thrombosed hemorrhoids  Chronic hypotension  Cancer of multiple primary sites [facility])     Discharge Instructions      Follow up with your oncologist tomorrow for your blood transfusion.  May need to add a stool softener or Miralx to help keep stool soft.    Your blood work did show anemia. Your exam showed some hemorrhoids. There was no blood seen on your stool card.   To prevent hemorrhoids, try eating small meals frequently and increased fluid intake, as well as gradual increase in fiber intake with dried fruits, vegetables with skins, beans, whole grains and cereal. Goal to eat 30 g of fiber per day. Drinking hot beverages and  prune juice and adding probiotic-containing foods like pasteurized yogurt and kefir have been known to help.  Increased physical activity will help get your bowels moving for a bowel movement to occur naturally.    You may need to see a gastroenterologist/GI (gut) doctor to help manage your hemorrhoids. Follow up with your primary care provider.       ED Prescriptions     Medication Sig Dispense Auth. Provider   hydrocortisone  (ANUSOL -HC) 2.5 % rectal cream Place 1 Application rectally 2 (two) times daily. 30 g Ranae Casebier, DO      PDMP not reviewed this encounter.       Angella Montas, DO 05/28/24 [phone]

## 2024-05-21 NOTE — ED Triage Notes (Signed)
 Patient presents to UC for external hemorrhoids noted 4 days ago. States he has been straining with BM, has noted some blood in stool. Hx of hemorrhoids. Has used heating pad with some relief. He has been taking his codeine for pain.

## 2024-05-21 NOTE — Discharge Instructions (Addendum)
 Follow up with your oncologist tomorrow for your blood transfusion.  May need to add a stool softener or Miralx to help keep stool soft.    Your blood work did show anemia. Your exam showed some hemorrhoids. There was no blood seen on your stool card.   To prevent hemorrhoids, try eating small meals frequently and increased fluid intake, as well as gradual increase in fiber intake with dried fruits, vegetables with skins, beans, whole grains and cereal. Goal to eat 30 g of fiber per day. Drinking hot beverages and prune juice and adding probiotic-containing foods like pasteurized yogurt and kefir have been known to help.  Increased physical activity will help get your bowels moving for a bowel movement to occur naturally.    You may need to see a gastroenterologist/GI (gut) doctor to help manage your hemorrhoids. Follow up with your primary care provider.

## 2024-05-21 NOTE — Telephone Encounter (Signed)
 Patient called and said he has severe pain in his rectal area and while he is on the phone he is shortness of breath, he had a low blood pressure earlier today.  Wanted to see what we could do for him and I told him everybody is gone today and what is best is him to go to the ER.  He really does not want to go to the ER because it takes so long to get treated.  He asked if he can go to urgent care is right near him.  I told him that I think the best way is going to the ER however you can do whichever you think is best for you, he is going to talk to take a oxycodone  to see if that helps with the pain and I told him that if he does that he has to have someone to take him to either urgent care or ER but we would prefer ER.  He says his brother can take him.

## 2024-05-22 ENCOUNTER — Other Ambulatory Visit: Payer: Self-pay | Admitting: *Deleted

## 2024-05-22 ENCOUNTER — Telehealth: Payer: Self-pay | Admitting: *Deleted

## 2024-05-22 ENCOUNTER — Encounter: Payer: Self-pay | Admitting: Oncology

## 2024-05-22 ENCOUNTER — Ambulatory Visit (HOSPITAL_COMMUNITY): Payer: Self-pay

## 2024-05-22 ENCOUNTER — Other Ambulatory Visit: Payer: Self-pay | Admitting: Oncology

## 2024-05-22 ENCOUNTER — Inpatient Hospital Stay

## 2024-05-22 DIAGNOSIS — Z5111 Encounter for antineoplastic chemotherapy: Secondary | ICD-10-CM | POA: Diagnosis not present

## 2024-05-22 DIAGNOSIS — C184 Malignant neoplasm of transverse colon: Secondary | ICD-10-CM

## 2024-05-22 DIAGNOSIS — D649 Anemia, unspecified: Secondary | ICD-10-CM

## 2024-05-22 LAB — CBC WITH DIFFERENTIAL/PLATELET
Abs Immature Granulocytes: 0.01 K/uL (ref 0.00–0.07)
Basophils Absolute: 0 K/uL (ref 0.0–0.1)
Basophils Relative: 1 %
Eosinophils Absolute: 0 K/uL (ref 0.0–0.5)
Eosinophils Relative: 1 %
HCT: 22.4 % — ABNORMAL LOW (ref 39.0–52.0)
Hemoglobin: 7.3 g/dL — ABNORMAL LOW (ref 13.0–17.0)
Immature Granulocytes: 1 %
Lymphocytes Relative: 26 %
Lymphs Abs: 0.2 K/uL — ABNORMAL LOW (ref 0.7–4.0)
MCH: 28 pg (ref 26.0–34.0)
MCHC: 32.6 g/dL (ref 30.0–36.0)
MCV: 85.8 fL (ref 80.0–100.0)
Monocytes Absolute: 0.2 K/uL (ref 0.1–1.0)
Monocytes Relative: 24 %
Neutro Abs: 0.4 K/uL — CL (ref 1.7–7.7)
Neutrophils Relative %: 47 %
Platelets: 49 K/uL — ABNORMAL LOW (ref 150–400)
RBC: 2.61 MIL/uL — ABNORMAL LOW (ref 4.22–5.81)
RDW: 16 % — ABNORMAL HIGH (ref 11.5–15.5)
Smear Review: NORMAL
WBC: 0.9 K/uL — CL (ref 4.0–10.5)
nRBC: 0 % (ref 0.0–0.2)

## 2024-05-22 LAB — CMP (CANCER CENTER ONLY)
ALT: 17 U/L (ref 0–44)
AST: 16 U/L (ref 15–41)
Albumin: 4.1 g/dL (ref 3.5–5.0)
Alkaline Phosphatase: 232 U/L — ABNORMAL HIGH (ref 38–126)
Anion gap: 8 (ref 5–15)
BUN: 80 mg/dL — ABNORMAL HIGH (ref 8–23)
CO2: 20 mmol/L — ABNORMAL LOW (ref 22–32)
Calcium: 9 mg/dL (ref 8.9–10.3)
Chloride: 102 mmol/L (ref 98–111)
Creatinine: 2.92 mg/dL — ABNORMAL HIGH (ref 0.61–1.24)
GFR, Estimated: 23 mL/min — ABNORMAL LOW (ref >60)
Glucose, Bld: 103 mg/dL — ABNORMAL HIGH (ref 70–99)
Potassium: 4.9 mmol/L (ref 3.5–5.1)
Sodium: 130 mmol/L — ABNORMAL LOW (ref 135–145)
Total Bilirubin: 1.3 mg/dL — ABNORMAL HIGH (ref 0.0–1.2)
Total Protein: 8.1 g/dL (ref 6.5–8.1)

## 2024-05-22 LAB — PREPARE RBC (CROSSMATCH)

## 2024-05-22 NOTE — Telephone Encounter (Signed)
 He wants to know when he is going to be getting a blood transfusion his cancer treatment.  He was told by while he was in the hospital that that Dr. Glenwood that he might need to come blood transfusion.  And he said then [facility] told him that he can get transfusions here as when his numbers are low.  He would like to hear from [facility] if he can get a blood transfusion and when

## 2024-05-25 ENCOUNTER — Inpatient Hospital Stay

## 2024-05-25 DIAGNOSIS — C184 Malignant neoplasm of transverse colon: Secondary | ICD-10-CM

## 2024-05-25 DIAGNOSIS — Z5111 Encounter for antineoplastic chemotherapy: Secondary | ICD-10-CM | POA: Diagnosis not present

## 2024-05-25 MED ORDER — HEPARIN SOD (PORK) LOCK FLUSH 100 UNIT/ML IV SOLN
500.0000 [IU] | Freq: Every day | INTRAVENOUS | Status: AC | PRN
  Administered 2024-05-25: 500 [IU]
  Filled 2024-05-25: qty 5

## 2024-05-25 MED ORDER — SODIUM CHLORIDE 0.9% IV SOLUTION
250.0000 mL | INTRAVENOUS | Status: DC
  Administered 2024-05-25: 100 mL via INTRAVENOUS
  Filled 2024-05-25: qty 250

## 2024-05-25 MED ORDER — ACETAMINOPHEN 325 MG PO TABS
650.0000 mg | ORAL_TABLET | Freq: Once | ORAL | Status: DC
  Filled 2024-05-25: qty 2

## 2024-05-25 MED ORDER — DIPHENHYDRAMINE HCL 50 MG/ML IJ SOLN: 25.0000 mg | Freq: Once | INTRAMUSCULAR | Status: DC

## 2024-05-26 ENCOUNTER — Other Ambulatory Visit

## 2024-05-26 ENCOUNTER — Ambulatory Visit

## 2024-05-26 ENCOUNTER — Ambulatory Visit: Admitting: Nurse Practitioner

## 2024-05-26 LAB — TYPE AND SCREEN
ABO/RH(D): O POS
Antibody Screen: NEGATIVE
Unit division: 0

## 2024-05-26 LAB — BPAM RBC
Blood Product Expiration Date: 202508152359
ISSUE DATE / TIME: 202507211334
Unit Type and Rh: 202508152359
Unit Type and Rh: 5100

## 2024-05-28 ENCOUNTER — Encounter

## 2024-06-01 ENCOUNTER — Inpatient Hospital Stay

## 2024-06-01 NOTE — Progress Notes (Signed)
 [facility] CSW Progress Note  Clinical Child psychotherapist contacted patient by phone to follow-up on need for community resources.    Interventions: Provided patient with information about the [employer].  Although he receives a supplement for food through his insurance, he is not eligible for the grant because he does not receive food stamps.  CSW had consulted supervisor to confirm.  Patient stated he understood.  He said the [employer] card helped a lot.       Follow Up Plan:  CSW will follow-up with patient by phone     Chris CHRISTELLA Au, LCSW Clinical Social Worker [facility]

## 2024-06-02 ENCOUNTER — Inpatient Hospital Stay (HOSPITAL_BASED_OUTPATIENT_CLINIC_OR_DEPARTMENT_OTHER): Admitting: Oncology

## 2024-06-02 ENCOUNTER — Inpatient Hospital Stay

## 2024-06-02 ENCOUNTER — Encounter: Payer: Self-pay | Admitting: Urology

## 2024-06-02 ENCOUNTER — Encounter: Payer: Self-pay | Admitting: Oncology

## 2024-06-02 VITALS — BP 101/62 | HR 55 | Resp 18

## 2024-06-02 VITALS — BP 97/59 | HR 59 | Temp 97.0°F | Resp 18 | Ht 69.0 in | Wt 180.0 lb

## 2024-06-02 DIAGNOSIS — C61 Malignant neoplasm of prostate: Secondary | ICD-10-CM

## 2024-06-02 DIAGNOSIS — Z5111 Encounter for antineoplastic chemotherapy: Secondary | ICD-10-CM | POA: Diagnosis not present

## 2024-06-02 DIAGNOSIS — D649 Anemia, unspecified: Secondary | ICD-10-CM

## 2024-06-02 DIAGNOSIS — C184 Malignant neoplasm of transverse colon: Secondary | ICD-10-CM | POA: Diagnosis not present

## 2024-06-02 LAB — CBC WITH DIFFERENTIAL (CANCER CENTER ONLY)
Abs Immature Granulocytes: 0.14 K/uL — ABNORMAL HIGH (ref 0.00–0.07)
Basophils Absolute: 0 K/uL (ref 0.0–0.1)
Basophils Relative: 0 %
Eosinophils Absolute: 0.1 K/uL (ref 0.0–0.5)
Eosinophils Relative: 1 %
HCT: 23.3 % — ABNORMAL LOW (ref 39.0–52.0)
Hemoglobin: 7.6 g/dL — ABNORMAL LOW (ref 13.0–17.0)
Immature Granulocytes: 2 %
Lymphocytes Relative: 5 %
Lymphs Abs: 0.4 K/uL — ABNORMAL LOW (ref 0.7–4.0)
MCH: 28.7 pg (ref 26.0–34.0)
MCHC: 32.6 g/dL (ref 30.0–36.0)
MCV: 87.9 fL (ref 80.0–100.0)
Monocytes Absolute: 0.6 K/uL (ref 0.1–1.0)
Monocytes Relative: 9 %
Neutro Abs: 5.3 K/uL (ref 1.7–7.7)
Neutrophils Relative %: 83 %
Platelet Count: 90 K/uL — ABNORMAL LOW (ref 150–400)
RBC: 2.65 MIL/uL — ABNORMAL LOW (ref 4.22–5.81)
RDW: 18.2 % — ABNORMAL HIGH (ref 11.5–15.5)
WBC Count: 6.5 K/uL (ref 4.0–10.5)
nRBC: 0 % (ref 0.0–0.2)

## 2024-06-02 LAB — SAMPLE TO BLOOD BANK

## 2024-06-02 LAB — CMP (CANCER CENTER ONLY)
ALT: 20 U/L (ref 0–44)
AST: 27 U/L (ref 15–41)
Albumin: 3.7 g/dL (ref 3.5–5.0)
Alkaline Phosphatase: 229 U/L — ABNORMAL HIGH (ref 38–126)
Anion gap: 7 (ref 5–15)
BUN: 79 mg/dL — ABNORMAL HIGH (ref 8–23)
CO2: 22 mmol/L (ref 22–32)
Calcium: 8.8 mg/dL — ABNORMAL LOW (ref 8.9–10.3)
Chloride: 106 mmol/L (ref 98–111)
Creatinine: 2.84 mg/dL — ABNORMAL HIGH (ref 0.61–1.24)
GFR, Estimated: 23 mL/min — ABNORMAL LOW (ref >60)
Glucose, Bld: 104 mg/dL — ABNORMAL HIGH (ref 70–99)
Potassium: 4.2 mmol/L (ref 3.5–5.1)
Sodium: 135 mmol/L (ref 135–145)
Total Bilirubin: 0.9 mg/dL (ref 0.0–1.2)
Total Protein: 7.9 g/dL (ref 6.5–8.1)

## 2024-06-02 LAB — MAGNESIUM: Magnesium: 1.7 mg/dL (ref 1.7–2.4)

## 2024-06-02 MED ORDER — FLUOROURACIL CHEMO INJECTION 2.5 GM/50ML
360.0000 mg/m2 | Freq: Once | INTRAVENOUS | Status: AC
  Administered 2024-06-02: 700 mg via INTRAVENOUS
  Filled 2024-06-02: qty 14

## 2024-06-02 MED ORDER — ATROPINE SULFATE 1 MG/ML IV SOLN
0.5000 mg | Freq: Once | INTRAVENOUS | Status: AC | PRN
  Administered 2024-06-02: 0.5 mg via INTRAVENOUS
  Filled 2024-06-02: qty 1

## 2024-06-02 MED ORDER — SODIUM CHLORIDE 0.9 % IV SOLN
160.0000 mg/m2 | Freq: Once | INTRAVENOUS | Status: AC
  Administered 2024-06-02: 300 mg via INTRAVENOUS
  Filled 2024-06-02: qty 15

## 2024-06-02 MED ORDER — PALONOSETRON HCL INJECTION 0.25 MG/5ML
0.2500 mg | Freq: Once | INTRAVENOUS | Status: AC
  Administered 2024-06-02: 0.25 mg via INTRAVENOUS
  Filled 2024-06-02: qty 5

## 2024-06-02 MED ORDER — SODIUM CHLORIDE 0.9 % IV SOLN
360.0000 mg/m2 | Freq: Once | INTRAVENOUS | Status: AC
  Administered 2024-06-02: 716 mg via INTRAVENOUS
  Filled 2024-06-02: qty 25

## 2024-06-02 MED ORDER — DEXAMETHASONE SODIUM PHOSPHATE 10 MG/ML IJ SOLN
10.0000 mg | Freq: Once | INTRAMUSCULAR | Status: AC
  Administered 2024-06-02: 10 mg via INTRAVENOUS
  Filled 2024-06-02: qty 1

## 2024-06-02 MED ORDER — SODIUM CHLORIDE 0.9 % IV SOLN
2160.0000 mg/m2 | INTRAVENOUS | Status: DC
  Administered 2024-06-02: 4300 mg via INTRAVENOUS
  Filled 2024-06-02: qty 86

## 2024-06-02 MED ORDER — SODIUM CHLORIDE 0.9 % IV SOLN
INTRAVENOUS | Status: DC
  Filled 2024-06-02: qty 250

## 2024-06-02 NOTE — Patient Instructions (Signed)
 [facility] - A DEPT OF [facility]  Discharge Instructions: Thank you for choosing [facility] to provide your oncology and hematology care.  If you have a lab appointment with the Cancer Center, please go directly to the Cancer Center and check in at the registration area.  Wear comfortable clothing and clothing appropriate for easy access to any Portacath or PICC line.   We strive to give you quality time with your provider. You may need to reschedule your appointment if you arrive late (15 or more minutes).  Arriving late affects you and other patients whose appointments are after yours.  Also, if you miss three or more appointments without notifying the office, you may be dismissed from the clinic at the provider's discretion.      For prescription refill requests, have your pharmacy contact our office and allow 72 hours for refills to be completed.    Today you received the following chemotherapy and/or immunotherapy agents- irinotecan , 5FU      To help prevent nausea and vomiting after your treatment, we encourage you to take your nausea medication as directed.  BELOW ARE SYMPTOMS THAT SHOULD BE REPORTED IMMEDIATELY: *FEVER GREATER THAN 100.4 F (38 C) OR HIGHER *CHILLS OR SWEATING *NAUSEA AND VOMITING THAT IS NOT CONTROLLED WITH YOUR NAUSEA MEDICATION *UNUSUAL SHORTNESS OF BREATH *UNUSUAL BRUISING OR BLEEDING *URINARY PROBLEMS (pain or burning when urinating, or frequent urination) *BOWEL PROBLEMS (unusual diarrhea, constipation, pain near the anus) TENDERNESS IN MOUTH AND THROAT WITH OR WITHOUT PRESENCE OF ULCERS (sore throat, sores in mouth, or a toothache) UNUSUAL RASH, SWELLING OR PAIN  UNUSUAL VAGINAL DISCHARGE OR ITCHING   Items with * indicate a potential emergency and should be followed up as soon as possible or go to the Emergency Department if any problems should occur.  Please show the CHEMOTHERAPY ALERT CARD or  IMMUNOTHERAPY ALERT CARD at check-in to the Emergency Department and triage nurse.  Should you have questions after your visit or need to cancel or reschedule your appointment, please contact [facility] - A DEPT OF [facility]  [phone] and follow the prompts.  Office hours are 8:00 a.m. to 4:30 p.m. Monday - Friday. Please note that voicemails left after 4:00 p.m. may not be returned until the following business day.  We are closed weekends and major holidays. You have access to a nurse at all times for urgent questions. Please call the main number to the clinic [phone] and follow the prompts.  For any non-urgent questions, you may also contact your provider using MyChart. We now offer e-Visits for anyone 37 and older to request care online for non-urgent symptoms. For details visit [email or web address].   Also download the MyChart app! Go to the app store, search MyChart, open the app, select [place], and log in with your MyChart username and password.

## 2024-06-02 NOTE — Progress Notes (Unsigned)
 Patient is doing ok, no new questions for the provider today.

## 2024-06-02 NOTE — Progress Notes (Unsigned)
 [facility]  Telephone:([phone]) [phone] Fax:(336) [phone]  ID: Chris George OB: [birth year]  MR#: [id number]  RDW#:[id number]  Patient Care Team: Jeffie Cheryl FORBES, MD as PCP - General (Family Medicine) [name], MD as Consulting Physician (Oncology) [name], MD as Referring Physician (Neurology) Marcelino Gales, MD as Consulting Physician (Nephrology) Jama Margery ORN, MD as Referring Physician (Cardiology) Lenn Aran, MD as Consulting Physician (Radiation Oncology)  CHIEF COMPLAINT: Recurrent colon cancer, smoldering myeloma, prostate cancer.  INTERVAL HISTORY: Patient returns to clinic today for repeat laboratory work and further evaluation.  He received chemotherapy with FOLFIRI 1 week ago.  He continues to have chronic weakness and fatigue.  He had a poor appetite this week with worsening diarrhea and painful hemorrhoids.  He has a mild expressive aphasia and a chronic peripheral neuropathy, but no other neurologic complaints.  He denies any recent fevers.  He has no chest pain, cough, shortness of breath, or hemoptysis. He has no further melena or hematochezia.  He has no urinary complaints.  Patient offers no further specific complaints today.  REVIEW OF SYSTEMS:   Review of Systems  Constitutional:  Positive for malaise/fatigue and weight loss. Negative for fever.  Respiratory: Negative.  Negative for cough, hemoptysis and shortness of breath.   Cardiovascular: Negative.  Negative for chest pain and leg swelling.  Gastrointestinal:  Positive for diarrhea and nausea. Negative for blood in stool, melena and vomiting.  Genitourinary: Negative.  Negative for hematuria.  Musculoskeletal: Negative.  Negative for back pain and joint pain.  Skin: Negative.  Negative for rash.  Neurological:  Positive for tingling, sensory change and weakness. Negative for dizziness, focal weakness and headaches.  Psychiatric/Behavioral: Negative.  Negative for  depression and hallucinations. The patient is not nervous/anxious.   Bilateral  As per HPI. Otherwise, a complete review of systems is negative.  PAST MEDICAL HISTORY: Past Medical History:  Diagnosis Date  . A-fib (HCC)   . Anemia   . Arthritis    ankles  . Cancer (HCC)    multiple myeloma  . 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HCC) 10/26/2019  . Cardiomyopathy (HCC)   . Chemotherapy-induced peripheral neuropathy (HCC)   . CHF (congestive heart failure) (HCC)   . Depression   . Dysrhythmia    atrial fib  . History of CVA (cerebrovascular accident)   . HLD (hyperlipidemia)   . Hypercholesteremia   . Hypertension   . Moderate tricuspid insufficiency   . Multiple myeloma (HCC)    not being treated right now per pt 11/11/19  . Pneumonia   . Sleep apnea    No CPAP.  OSA resolved with wt loss.    PAST SURGICAL HISTORY: Past Surgical History:  Procedure Laterality Date  . ATRIAL ABLATION SURGERY    . CARDIAC SURGERY     A-Fib Ablations  . COLONOSCOPY N/A 03/17/2024   Procedure: COLONOSCOPY;  Surgeon: Jinny Carmine, MD;  Location: [facility] ENDOSCOPY;  Service: Endoscopy;  Laterality: N/A;  . COLONOSCOPY WITH PROPOFOL  N/A 10/26/2019   Procedure: COLONOSCOPY WITH BIOPSY;  Surgeon: Jinny Carmine, MD;  Location: [facility] SURGERY CNTR;  Service: Endoscopy;  Laterality: N/A;  . COLONOSCOPY WITH PROPOFOL  N/A 09/03/2022   Procedure: COLONOSCOPY WITH PROPOFOL ;  Surgeon: Jinny Carmine, MD;  Location: [facility];  Service: Endoscopy;  Laterality: N/A;  . ESOPHAGOGASTRODUODENOSCOPY N/A 03/17/2024   Procedure: GI Bleeding;  Surgeon: Jinny Carmine, MD;  Location: [facility] ENDOSCOPY;  Service: Endoscopy;  Laterality: N/A;  GI Bleeding, EGD  .  ESOPHAGOGASTRODUODENOSCOPY (EGD) WITH PROPOFOL  N/A 10/26/2019   Procedure: ESOPHAGOGASTRODUODENOSCOPY (EGD) WITH BIOPSY;  Surgeon: Jinny Carmine, MD;  Location: [facility] SURGERY CNTR;  Service: Endoscopy;  Laterality: N/A;  sleep apnea  . LAPAROSCOPIC PARTIAL COLECTOMY  Right 11/17/2019   Procedure: LAPAROSCOPIC PARTIAL COLECTOMY RIGHT EXTENDED;  Surgeon: Desiderio Schanz, MD;  Location: [facility] ORS;  Service: General;  Laterality: Right;  . PORTACATH PLACEMENT N/A 12/28/2019   Procedure: INSERTION PORT-A-CATH Left subclavian;  Surgeon: Desiderio Schanz, MD;  Location: [facility] ORS;  Service: General;  Laterality: N/A;    FAMILY HISTORY: Family History  Problem Relation Age of Onset  . Healthy Mother     ADVANCED DIRECTIVES (Y/N):  N  HEALTH MAINTENANCE: Social History   Tobacco Use  . Smoking status: Never    Passive exposure: Never  . Smokeless tobacco: Never  Vaping Use  . Vaping status: Never Used  Substance Use Topics  . Alcohol use: Not Currently  . Drug use: Never     Colonoscopy:  PAP:  Bone density:  Lipid panel:  No Known Allergies  Current Outpatient Medications  Medication Sig Dispense Refill  . allopurinol  (ZYLOPRIM ) 100 MG tablet Take 200 mg by mouth daily.    SABRA apixaban (ELIQUIS) 5 MG TABS tablet Take 5 mg by mouth 2 (two) times daily.    . atorvastatin  (LIPITOR) 80 MG tablet Take 80 mg by mouth daily.    . bumetanide  (BUMEX ) 2 MG tablet Take 2 mg by mouth once a week. Taking one in the morning.    . colchicine  0.6 MG tablet Take 1 tablet (0.6 mg total) by mouth daily as needed. Until flare resolves.    . hydrocortisone  (ANUSOL -HC) 2.5 % rectal cream Place 1 Application rectally 2 (two) times daily. 30 g 0  . lidocaine -prilocaine  (EMLA ) cream Apply 1 Application topically daily.    . magnesium  chloride (SLOW-MAG) 64 MG TBEC SR tablet Take 1 tablet (64 mg total) by mouth daily. 60 tablet 1  . ondansetron  (ZOFRAN ) 8 MG tablet Take 1 tablet (8 mg total) by mouth every 8 (eight) hours as needed for nausea or vomiting. 20 tablet 0  . oxyCODONE -acetaminophen  (PERCOCET/ROXICET) 5-325 MG tablet Take 1-2 tablets by mouth every 6 (six) hours as needed for severe pain (pain score 7-10). 60 tablet 0  . prochlorperazine  (COMPAZINE ) 10 MG tablet  Take 1 tablet (10 mg total) by mouth every 6 (six) hours as needed for nausea or vomiting. 30 tablet 0  . spironolactone (ALDACTONE) 25 MG tablet Take 1 tablet by mouth every morning.    . tamsulosin  (FLOMAX ) 0.4 MG CAPS capsule Take 1 capsule (0.4 mg total) by mouth daily after supper. 30 capsule 6   No current facility-administered medications for this visit.   Facility-Administered Medications Ordered in Other Visits  Medication Dose Route Frequency Provider Last Rate Last Admin  . heparin  lock flush 100 unit/mL  500 Units Intravenous Once Keenen Roessner J, MD        OBJECTIVE: Vitals:   06/02/24 0906  BP: (!) 97/59  Pulse: (!) 59  Resp: 18  Temp: (!) 97 F (36.1 C)  SpO2: 100%       Body mass index is 26.58 kg/m.    ECOG FS:1 - Symptomatic but completely ambulatory  General: Well-developed, well-nourished, no acute distress. Eyes: Pink conjunctiva, anicteric sclera. HEENT: Normocephalic, moist mucous membranes. Lungs: No audible wheezing or coughing. Heart: Regular rate and rhythm. Abdomen: Soft, nontender, no obvious distention. Musculoskeletal: No edema, cyanosis, or clubbing. Neuro:  Alert, answering all questions appropriately. Cranial nerves grossly intact. Skin: No rashes or petechiae noted. Psych: Normal affect.   LAB RESULTS:  Lab Results  Component Value Date   NA 135 06/02/2024   K 4.2 06/02/2024   CL 106 06/02/2024   CO2 22 06/02/2024   GLUCOSE 104 (H) 06/02/2024   BUN 79 (H) 06/02/2024   CREATININE 2.84 (H) 06/02/2024   CALCIUM  8.8 (L) 06/02/2024   PROT 7.9 06/02/2024   ALBUMIN 3.7 06/02/2024   AST 27 06/02/2024   ALT 20 06/02/2024   ALKPHOS 229 (H) 06/02/2024   BILITOT 0.9 06/02/2024   GFRNONAA 23 (L) 06/02/2024   GFRAA >60 07/29/2020    Lab Results  Component Value Date   WBC 6.5 06/02/2024   NEUTROABS 5.3 06/02/2024   HGB 7.6 (L) 06/02/2024   HCT 23.3 (L) 06/02/2024   MCV 87.9 06/02/2024   PLT 90 (L) 06/02/2024   Lab Results   Component Value Date   IRON 79 08/13/2023   TIBC 234 (L) 08/13/2023   IRONPCTSAT 34 08/13/2023   Lab Results  Component Value Date   FERRITIN 686 (H) 09/03/2023     STUDIES: No results found.     ONCOLOGY HISTORY: Patient completed cycle 9 of adjuvant FOLFOX on June 03, 2020.  Given his difficulties with treatment and declining performance status, treatment was discontinued altogether. Colonoscopy on September 03, 2022 did not reveal any significant pathology and recommendation was to repeat in 3 years.    ASSESSMENT: Recurrent colon cancer, smoldering myeloma, prostate cancer.  PLAN:    Recurrent colon cancer: PET scan results from December 30, 2023 reviewed independently with progressive disease of hypermetabolic retrocaval mass, but no other evidence of metastatic disease.  Patient has completed SBRT to this lesion.  Repeat PET scan on April 14, 2024 reviewed independently with continued progression of disease now with evidence of liver metastasis.  CEA overall has trended up and is now 412.  Patient received cycle 11 of dose reduced FOLFIRI last week.  He has been instructed to keep his previously scheduled follow-up appointment in 2 weeks for further evaluation and consideration of cycle 12. Prostate cancer: Gleason's 3+4.  Patient's PSA increased and peaked at 39.34, but since completing treatment it continues to trend down.  His most recent result is 1.61.  Nuclear medicine bone scan on December 13, 2022 was reported as negative.  PSMA PET per radiation oncology from Mar 21, 2023 did not reveal metastatic disease.  Patient completed XRT for local control disease on May 30, 2023.  Can consider Eligard in the future.  Continue follow-up with urology as indicated.   Smoldering myeloma: Chronic and unchanged.  Patient was initially diagnosed and treated in [place], [place]  and treated with single agent Velcade in approximately 2013.  He declined maintenance treatment or referral for  bone marrow transplant.  His M spike has ranged from 1.1-2.0 since July 2020.  His most recent result is 1.8.  Over the same timeframe his IgG component has ranged from [id number].  His most recent result is 2635.  Finally, his kappa free light chains have ranged from 24.8 -75.3.  His most recent result was elevated at 98.5, but his lambda free light chains are also elevated.  His kappa/lambda free light chain ratio was within normal limits.  Nuclear med bone scan as above.  His most recent bone marrow biopsy completed on May 28, 2019 revealed only 10 to 15% plasma cells with no clonality reported.  Cytogenetics  were also reported as normal.  Repeat laboratory work is pending at time of dictation.   Anemia: Hemoglobin mildly improved 8.1.  Patient declined blood transfusion tomorrow. Neutropenia: ANC is 1.4 today.  Patient does not require Udenyca  with his treatments at this time, but may need treatment in the future.  Continue with dose reduced FOLFIRI as above.  Thrombocytopenia: Platelets decreased to 44.  Secondary to chemotherapy.  Monitor.   Renal insufficiency: GFR mildly improved to 21.  Neither irinotecan  or 5-FU need to be dosed reduced in renal insufficiency.  Appreciate nephrology input.   Peripheral neuropathy: Chronic and unchanged.  Patient reports gabapentin  did not help.  Continue Lyrica  50 mg daily and was previously given a referral to neurology.   Hypotension: Chronic and unchanged.  Patient is asymptomatic.  Monitor. Coping/anxiety: Patient was previously given a referral to [facility].  CHF exacerbation/edema: Resolved.  Patient was recently at [facility] for nearly a month.  Continue follow-up with cardiology and nephrology.  Monitor closely with fluid infusion today and blood transfusion tomorrow. Diarrhea: Continue Imodium as needed. Hemorrhoids: Continue OTC remedies as needed.   Patient expressed understanding and was in agreement with this plan. He also understands that  He can call clinic at any time with any questions, concerns, or complaints.    Evalene JINNY Reusing, MD   06/02/2024 9:37 AM

## 2024-06-02 NOTE — Progress Notes (Signed)
 1240: Pt reports nausea and abdominal cramping. Irinotecan /Leucovorin  paused and Atropine  given per order. VS stable and Dr. Jacobo made aware.   1249: pt states all symptoms resolved, and Irinotecan / Leucovorin  restarted.

## 2024-06-03 ENCOUNTER — Encounter: Payer: Self-pay | Admitting: Oncology

## 2024-06-03 ENCOUNTER — Other Ambulatory Visit: Payer: Self-pay | Admitting: *Deleted

## 2024-06-03 DIAGNOSIS — C184 Malignant neoplasm of transverse colon: Secondary | ICD-10-CM

## 2024-06-03 LAB — PREPARE RBC (CROSSMATCH)

## 2024-06-03 LAB — CEA: CEA: 282 ng/mL — ABNORMAL HIGH (ref 0.0–4.7)

## 2024-06-04 ENCOUNTER — Inpatient Hospital Stay

## 2024-06-04 VITALS — BP 101/58 | HR 55 | Temp 97.4°F | Resp 16

## 2024-06-04 DIAGNOSIS — C184 Malignant neoplasm of transverse colon: Secondary | ICD-10-CM

## 2024-06-04 DIAGNOSIS — Z5111 Encounter for antineoplastic chemotherapy: Secondary | ICD-10-CM | POA: Diagnosis not present

## 2024-06-04 MED ORDER — ACETAMINOPHEN 325 MG PO TABS
650.0000 mg | ORAL_TABLET | Freq: Once | ORAL | Status: AC
  Administered 2024-06-04: 325 mg via ORAL
  Filled 2024-06-04: qty 2

## 2024-06-04 MED ORDER — PEGFILGRASTIM-JMDB 6 MG/0.6ML ~~LOC~~ SOSY
6.0000 mg | PREFILLED_SYRINGE | Freq: Once | SUBCUTANEOUS | Status: AC
  Administered 2024-06-04: 6 mg via SUBCUTANEOUS
  Filled 2024-06-04: qty 0.6

## 2024-06-04 MED ORDER — HEPARIN SOD (PORK) LOCK FLUSH 100 UNIT/ML IV SOLN
500.0000 [IU] | Freq: Once | INTRAVENOUS | Status: DC | PRN
  Filled 2024-06-04: qty 5

## 2024-06-04 MED ORDER — DIPHENHYDRAMINE HCL 50 MG/ML IJ SOLN: 25.0000 mg | Freq: Once | INTRAMUSCULAR | Status: DC

## 2024-06-04 MED ORDER — SODIUM CHLORIDE 0.9% IV SOLUTION
250.0000 mL | INTRAVENOUS | Status: DC
  Administered 2024-06-04: 250 mL via INTRAVENOUS
  Filled 2024-06-04: qty 250

## 2024-06-05 LAB — TYPE AND SCREEN
ABO/RH(D): O POS
Antibody Screen: NEGATIVE
Unit division: 0
Unit division: 0

## 2024-06-05 LAB — BPAM RBC
Blood Product Expiration Date: 202508052359
Blood Product Expiration Date: 202508182359
ISSUE DATE / TIME: 202507311256
Unit Type and Rh: 202508052359
Unit Type and Rh: 9500
Unit Type and Rh: 9500

## 2024-06-09 ENCOUNTER — Ambulatory Visit: Admitting: Oncology

## 2024-06-09 ENCOUNTER — Ambulatory Visit

## 2024-06-09 ENCOUNTER — Other Ambulatory Visit

## 2024-06-11 ENCOUNTER — Encounter

## 2024-06-16 ENCOUNTER — Other Ambulatory Visit

## 2024-06-16 ENCOUNTER — Ambulatory Visit: Admitting: Oncology

## 2024-06-16 ENCOUNTER — Ambulatory Visit

## 2024-06-17 ENCOUNTER — Telehealth: Payer: Self-pay

## 2024-06-17 NOTE — Telephone Encounter (Signed)
 Clinical Social Work attempted to contact patient by phone to provide emotional support.  Left voicemail with contact information and request for return call.

## 2024-06-18 ENCOUNTER — Encounter

## 2024-06-22 ENCOUNTER — Encounter: Payer: Self-pay | Admitting: Oncology

## 2024-06-22 NOTE — Progress Notes (Signed)
 Follow Up Visit   Patient Name: Chris George, male   Patient DOB: Dec 16, 1955 Date of Service: 06/22/2024  Patient MRN: [id number] Provider Creating Note: Bonnell Sherry, MD  [phone] Primary Care Physician:   [address] [place] [address] Additional Physicians/ Providers:    History of Present Illness Chris George is a 68 y.o. male who is following up today for chronic kidney disease stage IV, anemia of chronic kidney disease with history of smoldering multiple myeloma, secondary hyperparathyroidism.  Patient continues to have advanced renal dysfunction.  Most recent eGFR was 21.  In regards to anemia of chronic kidney disease most recent hemoglobin was 7.6.  He states that he did receive recent blood transfusion.  Patient also has secondary hyperparathyroidism with most recent PTH of 212, phosphorus 4.4, calcium  8.9.  He currently denies nausea, vomiting, or dysphagia.  Medications   Current Outpatient Medications:  .  apixaban (ELIQUIS) 5 MG tablet, Take 5 mg by mouth, Disp: , Rfl:  .  APPLE CIDER VINEGAR PO, Take by mouth, Disp: , Rfl:  .  atorvastatin  (LIPITOR) 80 MG tablet, Take 80 mg by mouth 1 (one) time each day, Disp: , Rfl:  .  BUMETANIDE  PO, Take 1.5 mg by mouth in the morning and 1.5 mg in the evening., Disp: , Rfl:  .  calcitriol (Rocaltrol) 0.25 MCG capsule, Take 1 capsule (0.25 mcg total) by mouth 1 (one) time each day, Disp: 90 capsule, Rfl: 3 .  CALCIUM  MAGNESIUM  ZINC PO, Take 3 tablets by mouth, Disp: , Rfl:  .  colchicine  0.6 MG tablet, Take 0.6 mg by mouth, Disp: , Rfl:  .  lidocaine -prilocaine  (EMLA ) cream, Apply 1 Application topically, Disp: , Rfl:  .  Misc Natural Products (TART CHERRY ADVANCED PO), Take 60 mL by mouth, Disp: , Rfl:  .  spironolactone (ALDACTONE) 25 MG tablet, Take 1 tablet by mouth in the morning., Disp: , Rfl:  .  tamsulosin  (FLOMAX ) 0.4 MG 24 hr capsule, Take 0.4 mg by mouth, Disp: , Rfl:    Allergies Patient has no known allergies.  Problem  List Patient Active Problem List  Diagnosis  . Anemia  . Diabetes mellitus without mention of complication, type II or unspecified type, not stated as uncontrolled (HCC)     Review of Systems  Constitutional:  Negative for chills and fever.  Respiratory:  Negative for cough and shortness of breath.   Cardiovascular:  Negative for chest pain and palpitations.  Gastrointestinal:  Negative for nausea and vomiting.  Genitourinary:  Negative for dysuria, hematuria and urgency.     History Past Medical History:  Diagnosis Date  . Anemia   . Atrial fibrillation (HCC)   . Congestive heart failure (HCC)   . Diabetes mellitus without mention of complication, type II or unspecified type, not stated as uncontrolled (HCC)   . Other and unspecified hyperlipidemia   . Other malignant neoplasm of unspecified site (HCC)   . Sleep apnea     Past Surgical History:  Procedure Laterality Date  . ATRIAL ABLATION SURGERY    . CARDIAC SURGERY    . COLECTOMY PARTIAL / TOTAL    . ESOPHAGOGASTRODUODENOSCOPY    . PORTACATH PLACEMENT     Family History  Problem Relation Age of Onset  . Heart disease Father   . Cancer Father   . Cancer Sister   . Kidney disease Sister   . Hypertension Sister   . Diabetes Sister    Social History   Tobacco Use  .  Smoking status: Never  . Smokeless tobacco: Never  Substance Use Topics  . Alcohol use: Not Currently        Physical Exam  Vitals BP 80/50 (BP Location: Right upper arm, Patient Position: Sitting)   Pulse 56   Temp 98.3 F   Wt 174 lb (78.9 kg)   SpO2 98%   BMI 25.70 kg/m   PHYSICAL EXAM: General appearance: well developed, well nourished, NAD Eyes: anicteric sclerae, moist conjunctivae; no lid-lag  HENT: Atraumatic; hearing intact Neck: Trachea midline; supple Lungs: CTAB, with normal respiratory effort  CV: S1S2, irregular Abdomen: Soft, non-tender; bowel sounds present Extremities: No lower extremity edema Skin: Warm and  dry, normal skin turgor, no rashes noted. Psych: Appropriate affect, alert and oriented to person, place and time    Laboratory Studies  Chemistry  Lab Units 02/17/24 0955 12/23/23 0928 12/18/23 1151 12/13/23 0700 12/12/23 0649 12/11/23 0600 12/10/23 1015 12/10/23 0618 12/09/23 2044 12/09/23 1047 12/09/23 0501 12/08/23 1134 12/08/23 0447 11/20/23 0620 11/19/23 1646 10/22/23 0958 05/21/23 0906 04/07/23 0514 04/06/23 0432 04/05/23 1351 12/17/22 1422 12/17/22 1422  SODIUM mmol/L 135 132* 133* 133* 131* 134* 132*  --  134* 133*  --    < > 135   < > 141   < > 138   < > 137 139  --  140  POTASSIUM mmol/L 3.8 4.8 4.6 3.9 4.1 4.0 3.7  --  3.8 3.5  --    < > 3.6   < > 4.4   < > 4.4   < > 3.8 3.7  --  4.3  CHLORIDE mmol/L 99 95* 97 93* 93* 93* 92*  --  91* 93*  --    < > 87*   < > 101   < > 104   < > 105 103  --  100  CO2 mmol/L 25 27 28.2 26.0 27.0 30.0 29.0  --  30.0 32.0*  --    < > 34.0*   < > 27.2   < > 30.9   < > 28.0 30.0  --  35.7*  ANION GAP mmol/L  --   --   --  14 11 11 11   --  13 8  --    < > 14   < > 13   < >  --    < > 4* 6   < >  --   MAGNESIUM  mg/dL  --   --   --  1.8 1.8 2.1  --  1.9  --   --  2.0  --  2.1   < >  --   --   --   --  1.7  --   --   --   CALCIUM  mg/dL 8.9 9.5 9.8 9.6 9.7 89.9 9.7  --  9.5 9.7  --    < > 9.5   < > 8.9   < > 8.8   < > 9.4 9.2  --  9.6  PHOSPHORUS mg/dL 4.4* 3.9  --  4.8 4.7 4.9  --  4.4  --   --  3.9  --  4.3   < >  --   --   --   --  2.4  --   --   --   ALK PHOS U/L  --   --  284*  --   --   --   --   --   --   --   --   --   --   --  262*  --  196*  --   --  151*  --  198*  PTH pg/mL 212* 139*  --   --   --   --   --   --   --   --   --   --   --   --   --   --   --   --   --   --   --   --   GLUCOSE mg/dL 868* 869* 884* 891 894 108 108  --  111 117  --    < > 115   < > 151   < > 139*   < > 127 140  --  142*  ALBUMIN g/dL 3.8 3.7 3.8  --   --   --   --   --   --   --   --   --   --   --  3.0*  --  3.7  --   --  3.7  --  4.1  BUN mg/dL 76*  73* 73* 76* 76* 77* 77*  --  81* 79*  --    < > 84*   < > 38*   < > 27*   < > 17 16  --  21  CREATININE mg/dL 6.91* 7.02* 2.9* 7.16* 2.85* 2.73* 2.73*  --  2.78* 2.96*  --    < > 3.54*   < > 2.22*   < > 1.7*   < > 1.30* 1.44*  --  1.6*  HEMOGLOBIN A1C %  --   --   --   --   --   --   --   --   --   --   --   --   --   --   --   --   --   --  6.9*  --   --  9.8*   < > = values in this interval not displayed.    Iron Studies  Lab Units 11/20/23 0620  FERRITIN ng/mL 413.1*    CBC  Lab Units 02/17/24 0955 12/23/23 0928  WBC AUTO Thousand/uL 2.4* 4.3  HEMOGLOBIN g/dL 5.7* 7.9*  HEMATOCRIT % 18.3* 25.4*  MCV fL 92.0 88.8  PLATELETS AUTO Thousand/uL 114* 179     Urine  Lab Units 06/09/24 1532 02/17/24 1043 12/23/23 0928 09/25/23 1455 05/21/23 0906 12/17/22 1422  PROT/CREAT RATIO UR mg/g creat  --   --  0.123  123 0.159*  159*  --   --   ALB MG/G CREAT UR ug/mg 29.8 8  --   --  62.3* 13.2    Lab Results  Component Value Date   PTH 212 (H) 02/17/2024   CALCIUM  8.9 02/17/2024   PHOS 4.4 (H) 02/17/2024     Imaging and Other Studies     Orders Placed This Encounter  . CBC and Differential  . Renal Function Panel  . PTH, Intact  . Urine Albumin / Creatinine Ratio  . calcitriol (Rocaltrol) 0.25 MCG capsule       Impression/Recommendations  Chris George is a 68 y.o. male with  past medical history of smoldering multiple myeloma, colon cancer, atrial fibrillation, history of diabetes mellitus type 2, hyperlipidemia, obstructive sleep apnea, chronic systolic heart failure ejection fraction 40 to 45% who was referred the evaluation of chronic kidney disease stage IIIa.  1.  Chronic kidney disease stage IV.  The patient's chronic kidney disease appears to be  stable most recent eGFR of 21.  Will plan to follow-up renal parameters today.  2.  Anemia of chronic kidney disease/smoldering multiple myeloma.  Most recent hemoglobin was 7.6.  He did receive recent blood  transfusion.  Defer decisions regarding erythropoietin stimulating agents and blood transfusion to hematology.  3.  Secondary hyperparathyroidism.  Most recent PTH was 212, phosphorus 4.4, calcium  8.9.  Start the patient on Calcitrol 0.25 mcg p.o. daily.  Repeat bone mineral metabolism parameters today.  Return in about 8 weeks (around 08/17/2024).   Munsoor Lateef, MD

## 2024-06-23 ENCOUNTER — Inpatient Hospital Stay

## 2024-06-23 ENCOUNTER — Inpatient Hospital Stay: Attending: Oncology

## 2024-06-23 ENCOUNTER — Other Ambulatory Visit

## 2024-06-23 ENCOUNTER — Telehealth: Payer: Self-pay | Admitting: Oncology

## 2024-06-23 ENCOUNTER — Ambulatory Visit

## 2024-06-23 ENCOUNTER — Encounter: Payer: Self-pay | Admitting: Oncology

## 2024-06-23 ENCOUNTER — Inpatient Hospital Stay (HOSPITAL_BASED_OUTPATIENT_CLINIC_OR_DEPARTMENT_OTHER): Admitting: Oncology

## 2024-06-23 ENCOUNTER — Ambulatory Visit: Admitting: Oncology

## 2024-06-23 VITALS — BP 87/52 | HR 52 | Temp 98.7°F | Resp 20 | Wt 177.0 lb

## 2024-06-23 DIAGNOSIS — Z79631 Long term (current) use of antimetabolite agent: Secondary | ICD-10-CM | POA: Diagnosis not present

## 2024-06-23 DIAGNOSIS — D649 Anemia, unspecified: Secondary | ICD-10-CM | POA: Diagnosis not present

## 2024-06-23 DIAGNOSIS — C184 Malignant neoplasm of transverse colon: Secondary | ICD-10-CM | POA: Diagnosis not present

## 2024-06-23 DIAGNOSIS — C189 Malignant neoplasm of colon, unspecified: Secondary | ICD-10-CM | POA: Diagnosis present

## 2024-06-23 DIAGNOSIS — Z5111 Encounter for antineoplastic chemotherapy: Secondary | ICD-10-CM | POA: Diagnosis present

## 2024-06-23 DIAGNOSIS — C787 Secondary malignant neoplasm of liver and intrahepatic bile duct: Secondary | ICD-10-CM | POA: Insufficient documentation

## 2024-06-23 DIAGNOSIS — Z79634 Long term (current) use of topoisomerase inhibitor: Secondary | ICD-10-CM | POA: Insufficient documentation

## 2024-06-23 LAB — CMP (CANCER CENTER ONLY)
ALT: 12 U/L (ref 0–44)
AST: 17 U/L (ref 15–41)
Albumin: 3.7 g/dL (ref 3.5–5.0)
Alkaline Phosphatase: 221 U/L — ABNORMAL HIGH (ref 38–126)
Anion gap: 8 (ref 5–15)
BUN: 72 mg/dL — ABNORMAL HIGH (ref 8–23)
CO2: 23 mmol/L (ref 22–32)
Calcium: 8.9 mg/dL (ref 8.9–10.3)
Chloride: 103 mmol/L (ref 98–111)
Creatinine: 3.31 mg/dL — ABNORMAL HIGH (ref 0.61–1.24)
GFR, Estimated: 19 mL/min — ABNORMAL LOW (ref >60)
Glucose, Bld: 89 mg/dL (ref 70–99)
Potassium: 4.6 mmol/L (ref 3.5–5.1)
Sodium: 134 mmol/L — ABNORMAL LOW (ref 135–145)
Total Bilirubin: 1 mg/dL (ref 0.0–1.2)
Total Protein: 7.8 g/dL (ref 6.5–8.1)

## 2024-06-23 LAB — CBC WITH DIFFERENTIAL (CANCER CENTER ONLY)
Abs Immature Granulocytes: 0.04 K/uL (ref 0.00–0.07)
Basophils Absolute: 0 K/uL (ref 0.0–0.1)
Basophils Relative: 0 %
Eosinophils Absolute: 0.2 K/uL (ref 0.0–0.5)
Eosinophils Relative: 3 %
HCT: 21.9 % — ABNORMAL LOW (ref 39.0–52.0)
Hemoglobin: 7.1 g/dL — ABNORMAL LOW (ref 13.0–17.0)
Immature Granulocytes: 1 %
Lymphocytes Relative: 6 %
Lymphs Abs: 0.4 K/uL — ABNORMAL LOW (ref 0.7–4.0)
MCH: 29 pg (ref 26.0–34.0)
MCHC: 32.4 g/dL (ref 30.0–36.0)
MCV: 89.4 fL (ref 80.0–100.0)
Monocytes Absolute: 0.7 K/uL (ref 0.1–1.0)
Monocytes Relative: 11 %
Neutro Abs: 4.8 K/uL (ref 1.7–7.7)
Neutrophils Relative %: 79 %
Platelet Count: 82 K/uL — ABNORMAL LOW (ref 150–400)
RBC: 2.45 MIL/uL — ABNORMAL LOW (ref 4.22–5.81)
RDW: 19.7 % — ABNORMAL HIGH (ref 11.5–15.5)
WBC Count: 6.1 K/uL (ref 4.0–10.5)
nRBC: 0 % (ref 0.0–0.2)

## 2024-06-23 LAB — MAGNESIUM: Magnesium: 1.7 mg/dL (ref 1.7–2.4)

## 2024-06-23 LAB — PREPARE RBC (CROSSMATCH)

## 2024-06-23 MED ORDER — DEXAMETHASONE SODIUM PHOSPHATE 10 MG/ML IJ SOLN
10.0000 mg | Freq: Once | INTRAMUSCULAR | Status: AC
  Administered 2024-06-23: 10 mg via INTRAVENOUS
  Filled 2024-06-23: qty 1

## 2024-06-23 MED ORDER — SODIUM CHLORIDE 0.9 % IV SOLN
360.0000 mg/m2 | Freq: Once | INTRAVENOUS | Status: AC
  Administered 2024-06-23: 716 mg via INTRAVENOUS
  Filled 2024-06-23: qty 25

## 2024-06-23 MED ORDER — SODIUM CHLORIDE 0.9 % IV SOLN
INTRAVENOUS | Status: DC
  Filled 2024-06-23: qty 250

## 2024-06-23 MED ORDER — SODIUM CHLORIDE 0.9 % IV SOLN
2160.0000 mg/m2 | INTRAVENOUS | Status: DC
  Administered 2024-06-23: 4300 mg via INTRAVENOUS
  Filled 2024-06-23: qty 86

## 2024-06-23 MED ORDER — PALONOSETRON HCL INJECTION 0.25 MG/5ML
0.2500 mg | Freq: Once | INTRAVENOUS | Status: AC
  Administered 2024-06-23: 0.25 mg via INTRAVENOUS
  Filled 2024-06-23: qty 5

## 2024-06-23 MED ORDER — ATROPINE SULFATE 1 MG/ML IV SOLN
0.5000 mg | Freq: Once | INTRAVENOUS | Status: AC | PRN
  Administered 2024-06-23: 0.5 mg via INTRAVENOUS
  Filled 2024-06-23: qty 1

## 2024-06-23 MED ORDER — FLUOROURACIL CHEMO INJECTION 2.5 GM/50ML
360.0000 mg/m2 | Freq: Once | INTRAVENOUS | Status: AC
  Administered 2024-06-23: 700 mg via INTRAVENOUS
  Filled 2024-06-23: qty 14

## 2024-06-23 MED ORDER — SODIUM CHLORIDE 0.9 % IV SOLN
160.0000 mg/m2 | Freq: Once | INTRAVENOUS | Status: AC
  Administered 2024-06-23: 300 mg via INTRAVENOUS
  Filled 2024-06-23: qty 15

## 2024-06-23 NOTE — Progress Notes (Unsigned)
 Patient states the neuropathy bothers him a little more now.

## 2024-06-23 NOTE — Progress Notes (Signed)
 [facility] CSW Progress Note  Clinical Child psychotherapist contacted patient by phone to follow-up on advance directives questions.    Interventions: Provided education and assistance to client regarding Advanced Directives.       Follow Up Plan:  CSW will see patient on Friday, 8/22 at 10am while he is in infusion.    Macario CHRISTELLA Au, LCSW Clinical Social Worker [facility]

## 2024-06-23 NOTE — Telephone Encounter (Signed)
 Error

## 2024-06-23 NOTE — Progress Notes (Unsigned)
 [facility]  [phone] [phone]  ID: Chris George OB: 03/21/1956  MR#: [id number]  RDW#:[id number]  Patient Care Team: Jeffie Cheryl FORBES, MD as PCP - General (Family Medicine) [name], MD as Consulting Physician (Oncology) [name], MD as Referring Physician (Neurology) Marcelino Gales, MD as Consulting Physician (Nephrology) Jama Margery ORN, MD as Referring Physician (Cardiology) Lenn Aran, MD as Consulting Physician (Radiation Oncology)  CHIEF COMPLAINT: Recurrent colon cancer, smoldering myeloma, prostate cancer.  INTERVAL HISTORY: Patient returns to clinic today for further evaluation and consideration of his next cycle of FOLFIRI.  He continues to have chronic weakness and fatigue, but otherwise feels well.  He has a mild expressive aphasia and a chronic peripheral neuropathy, but no other neurologic complaints.  He denies any recent fevers.  He has no chest pain, cough, shortness of breath, or hemoptysis.  He denies any nausea, vomiting, constipation, or diarrhea.  He does not complain of problems with his hemorrhoids today.  He has no further melena or hematochezia.  He has no urinary complaints.  Patient offers no further specific complaints today.  REVIEW OF SYSTEMS:   Review of Systems  Constitutional:  Positive for malaise/fatigue. Negative for fever and weight loss.  Respiratory: Negative.  Negative for cough, hemoptysis and shortness of breath.   Cardiovascular: Negative.  Negative for chest pain and leg swelling.  Gastrointestinal: Negative.  Negative for blood in stool, diarrhea, melena, nausea and vomiting.  Genitourinary: Negative.  Negative for hematuria.  Musculoskeletal: Negative.  Negative for back pain and joint pain.  Skin: Negative.  Negative for rash.  Neurological:  Positive for tingling, sensory change and weakness. Negative for dizziness, focal weakness and headaches.  Psychiatric/Behavioral:  Negative.  Negative for depression and hallucinations. The patient is not nervous/anxious.   Bilateral  As per HPI. Otherwise, a complete review of systems is negative.  PAST MEDICAL HISTORY: Past Medical History:  Diagnosis Date   A-fib (HCC)    Anemia    Arthritis    ankles   Cancer (HCC)    multiple myeloma   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HCC) 10/26/2019   Cardiomyopathy (HCC)    Chemotherapy-induced peripheral neuropathy (HCC)    CHF (congestive heart failure) (HCC)    Depression    Dysrhythmia    atrial fib   History of CVA (cerebrovascular accident)    HLD (hyperlipidemia)    Hypercholesteremia    Hypertension    Moderate tricuspid insufficiency    Multiple myeloma (HCC)    not being treated right now per pt 11/11/19   Pneumonia    Sleep apnea    No CPAP.  OSA resolved with wt loss.    PAST SURGICAL HISTORY: Past Surgical History:  Procedure Laterality Date   ATRIAL ABLATION SURGERY     CARDIAC SURGERY     A-Fib Ablations   COLONOSCOPY N/A 03/17/2024   Procedure: COLONOSCOPY;  Surgeon: Jinny Carmine, MD;  Location: [facility] ENDOSCOPY;  Service: Endoscopy;  Laterality: N/A;   COLONOSCOPY WITH PROPOFOL  N/A 10/26/2019   Procedure: COLONOSCOPY WITH BIOPSY;  Surgeon: Jinny Carmine, MD;  Location: [facility];  Service: Endoscopy;  Laterality: N/A;   COLONOSCOPY WITH PROPOFOL  N/A 09/03/2022   Procedure: COLONOSCOPY WITH PROPOFOL ;  Surgeon: Jinny Carmine, MD;  Location: [facility] SURGERY [facility]TR;  Service: Endoscopy;  Laterality: N/A;   ESOPHAGOGASTRODUODENOSCOPY N/A 03/17/2024   Procedure: GI Bleeding;  Surgeon: Jinny Carmine, MD;  Location: [facility] ENDOSCOPY;  Service: Endoscopy;  Laterality: N/A;  GI  Bleeding, EGD   ESOPHAGOGASTRODUODENOSCOPY (EGD) WITH PROPOFOL  N/A 10/26/2019   Procedure: ESOPHAGOGASTRODUODENOSCOPY (EGD) WITH BIOPSY;  Surgeon: Jinny Carmine, MD;  Location: [facility] SURGERY CNTR;  Service: Endoscopy;  Laterality: N/A;  sleep apnea   LAPAROSCOPIC PARTIAL COLECTOMY  Right 11/17/2019   Procedure: LAPAROSCOPIC PARTIAL COLECTOMY RIGHT EXTENDED;  Surgeon: Desiderio Schanz, MD;  Location: [facility] ORS;  Service: General;  Laterality: Right;   PORTACATH PLACEMENT N/A 12/28/2019   Procedure: INSERTION PORT-A-CATH Left subclavian;  Surgeon: Desiderio Schanz, MD;  Location: [facility] ORS;  Service: General;  Laterality: N/A;    FAMILY HISTORY: Family History  Problem Relation Age of Onset   Healthy Mother     ADVANCED DIRECTIVES (Y/N):  N  HEALTH MAINTENANCE: Social History   Tobacco Use   Smoking status: Never    Passive exposure: Never   Smokeless tobacco: Never  Vaping Use   Vaping status: Never Used  Substance Use Topics   Alcohol use: Not Currently   Drug use: Never     Colonoscopy:  PAP:  Bone density:  Lipid panel:  No Known Allergies  Current Outpatient Medications  Medication Sig Dispense Refill   apixaban (ELIQUIS) 5 MG TABS tablet Take 5 mg by mouth 2 (two) times daily.     atorvastatin  (LIPITOR) 80 MG tablet Take 80 mg by mouth daily.     bumetanide  (BUMEX ) 2 MG tablet Take 2 mg by mouth once a week. Taking one in the morning.     colchicine  0.6 MG tablet Take 1 tablet (0.6 mg total) by mouth daily as needed. Until flare resolves.     hydrocortisone  (ANUSOL -HC) 2.5 % rectal cream Place 1 Application rectally 2 (two) times daily. 30 g 0   lidocaine -prilocaine  (EMLA ) cream Apply 1 Application topically daily.     magnesium  chloride (SLOW-MAG) 64 MG TBEC SR tablet Take 1 tablet (64 mg total) by mouth daily. 60 tablet 1   ondansetron  (ZOFRAN ) 8 MG tablet Take 1 tablet (8 mg total) by mouth every 8 (eight) hours as needed for nausea or vomiting. 20 tablet 0   oxyCODONE -acetaminophen  (PERCOCET/ROXICET) 5-325 MG tablet Take 1-2 tablets by mouth every 6 (six) hours as needed for severe pain (pain score 7-10). 60 tablet 0   prochlorperazine  (COMPAZINE ) 10 MG tablet Take 1 tablet (10 mg total) by mouth every 6 (six) hours as needed for nausea or vomiting.  30 tablet 0   spironolactone (ALDACTONE) 25 MG tablet Take 1 tablet by mouth every morning.     tamsulosin  (FLOMAX ) 0.4 MG CAPS capsule Take 1 capsule (0.4 mg total) by mouth daily after supper. 30 capsule 6   allopurinol  (ZYLOPRIM ) 100 MG tablet Take 200 mg by mouth daily. (Patient not taking: Reported on 06/23/2024)     No current facility-administered medications for this visit.   Facility-Administered Medications Ordered in Other Visits  Medication Dose Route Frequency Provider Last Rate Last Admin   heparin  lock flush 100 unit/mL  500 Units Intravenous Once Jessen Siegman J, MD        OBJECTIVE: Vitals:   06/23/24 0947  BP: (!) 87/52  Pulse: (!) 52  Resp: 20  Temp: 98.7 F (37.1 C)  SpO2: 100%       Body mass index is 26.14 kg/m.    ECOG FS:1 - Symptomatic but completely ambulatory  General: Well-developed, well-nourished, no acute distress. Eyes: Pink conjunctiva, anicteric sclera. HEENT: Normocephalic, moist mucous membranes. Lungs: No audible wheezing or coughing. Heart: Regular rate and rhythm. Abdomen: Soft, nontender, no  obvious distention. Musculoskeletal: No edema, cyanosis, or clubbing. Neuro: Alert, answering all questions appropriately. Cranial nerves grossly intact. Skin: No rashes or petechiae noted. Psych: Normal affect.  LAB RESULTS:  Lab Results  Component Value Date   NA 134 (L) 06/23/2024   K 4.6 06/23/2024   CL 103 06/23/2024   CO2 23 06/23/2024   GLUCOSE 89 06/23/2024   BUN 72 (H) 06/23/2024   CREATININE 3.31 (H) 06/23/2024   CALCIUM  8.9 06/23/2024   PROT 7.8 06/23/2024   ALBUMIN 3.7 06/23/2024   AST 17 06/23/2024   ALT 12 06/23/2024   ALKPHOS 221 (H) 06/23/2024   BILITOT 1.0 06/23/2024   GFRNONAA 19 (L) 06/23/2024   GFRAA >60 07/29/2020    Lab Results  Component Value Date   WBC 6.1 06/23/2024   NEUTROABS 4.8 06/23/2024   HGB 7.1 (L) 06/23/2024   HCT 21.9 (L) 06/23/2024   MCV 89.4 06/23/2024   PLT 82 (L) 06/23/2024   Lab  Results  Component Value Date   IRON 79 08/13/2023   TIBC 234 (L) 08/13/2023   IRONPCTSAT 34 08/13/2023   Lab Results  Component Value Date   FERRITIN 686 (H) 09/03/2023     STUDIES: No results found.   ONCOLOGY HISTORY: Patient completed cycle 9 of adjuvant FOLFOX on June 03, 2020.  Given his difficulties with treatment and declining performance status, treatment was discontinued altogether. Colonoscopy on September 03, 2022 did not reveal any significant pathology and recommendation was to repeat in 3 years.    ASSESSMENT: Recurrent colon cancer, smoldering myeloma, prostate cancer.  PLAN:    Recurrent colon cancer: PET scan results from December 30, 2023 reviewed independently with progressive disease of hypermetabolic retrocaval mass, but no other evidence of metastatic disease.  Patient has completed SBRT to this lesion.  Repeat PET scan on April 14, 2024 reviewed independently with continued progression of disease now with evidence of liver metastasis.  Since reinitiating treatment, patient CEA has trended down to 245.  Patient has a chronic anemia and thrombocytopenia, but will proceed with cycle 13 of dose reduced FOLFIRI today.  Return to clinic in 2 days for pump removal, in 3 days for 2 units of packed red blood cells.  Patient with then return to clinic in 3 weeks for further evaluation and consideration of cycle 14.   Prostate cancer: Gleason's 3+4.  Patient's PSA increased and peaked at 39.34, but since completing treatment it continues to trend down.  His most recent result is 1.61.  Nuclear medicine bone scan on December 13, 2022 was reported as negative.  PSMA PET per radiation oncology from Mar 21, 2023 did not reveal metastatic disease.  Patient completed XRT for local control disease on May 30, 2023.  Can consider Eligard in the future.  Continue follow-up with urology as indicated.   Smoldering myeloma: Chronic and unchanged.  Patient was initially diagnosed and treated in  [place], [place]  and treated with single agent Velcade in approximately 2013.  He declined maintenance treatment or referral for bone marrow transplant.  His M spike has ranged from 1.1-2.0 since July 2020.  His most recent result is 1.8.  Over the same timeframe his IgG component has ranged from 332-665-1984.  His most recent result is 2635.  Finally, his kappa free light chains have ranged from 24.8 -75.3.  His most recent result was elevated at 98.5, but his lambda free light chains are also elevated.  His kappa/lambda free light chain ratio was within normal limits.  Nuclear med bone scan as above.  His most recent bone marrow biopsy completed on May 28, 2019 revealed only 10 to 15% plasma cells with no clonality reported.  Cytogenetics were also reported as normal.  Repeat laboratory work is pending at time of dictation.   Anemia: Hemoglobin is trended down to 7.1 despite recent transfusion.  Return to clinic on Friday for 2 units of packed red blood cells as above  Neutropenia: Resolved.  Patient does not require Udenyca  with his treatments at this time, but may need treatment in the future.  Continue with dose reduced FOLFIRI as above.  Thrombocytopenia: Chronic and unchanged.  Patient's platelet count is 82 today.  Proceed cautiously with treatment as above.  Monitor.   Renal insufficiency: GFR trended down to 19.  Neither irinotecan  or 5-FU need to be dosed reduced in renal insufficiency.  Continue follow-up with nephrology as scheduled. Peripheral neuropathy: Chronic and unchanged.  Patient reports gabapentin  did not help.  Continue Lyrica  50 mg daily and was previously given a referral to neurology.   Hypotension: Chronic and unchanged.  Patient is asymptomatic.  Monitor. Coping/anxiety: Patient was previously given a referral to [facility].  CHF exacerbation/edema: Resolved.  Patient was recently at [facility] for nearly a month.  Continue follow-up with cardiology and nephrology.  Monitor  closely with fluid infusion today and blood transfusion tomorrow. Diarrhea: Resolved.  Continue Imodium as needed. Hemorrhoids: Patient does not complain of this today.  Continue OTC remedies as needed.   Patient expressed understanding and was in agreement with this plan. He also understands that He can call clinic at any time with any questions, concerns, or complaints.    Evalene JINNY Reusing, MD   06/23/2024 10:08 AM

## 2024-06-23 NOTE — Patient Instructions (Signed)
 [facility] - A DEPT OF [facility]  Discharge Instructions: Thank you for choosing [facility] to provide your oncology and hematology care.  If you have a lab appointment with the Cancer Center, please go directly to the Cancer Center and check in at the registration area.  Wear comfortable clothing and clothing appropriate for easy access to any Portacath or PICC line.   We strive to give you quality time with your provider. You may need to reschedule your appointment if you arrive late (15 or more minutes).  Arriving late affects you and other patients whose appointments are after yours.  Also, if you miss three or more appointments without notifying the office, you may be dismissed from the clinic at the provider's discretion.      For prescription refill requests, have your pharmacy contact our office and allow 72 hours for refills to be completed.    Today you received the following chemotherapy and/or immunotherapy agents Irinotecan , Leucovorin  & Adrucil       To help prevent nausea and vomiting after your treatment, we encourage you to take your nausea medication as directed.  BELOW ARE SYMPTOMS THAT SHOULD BE REPORTED IMMEDIATELY: *FEVER GREATER THAN 100.4 F (38 C) OR HIGHER *CHILLS OR SWEATING *NAUSEA AND VOMITING THAT IS NOT CONTROLLED WITH YOUR NAUSEA MEDICATION *UNUSUAL SHORTNESS OF BREATH *UNUSUAL BRUISING OR BLEEDING *URINARY PROBLEMS (pain or burning when urinating, or frequent urination) *BOWEL PROBLEMS (unusual diarrhea, constipation, pain near the anus) TENDERNESS IN MOUTH AND THROAT WITH OR WITHOUT PRESENCE OF ULCERS (sore throat, sores in mouth, or a toothache) UNUSUAL RASH, SWELLING OR PAIN  UNUSUAL VAGINAL DISCHARGE OR ITCHING   Items with * indicate a potential emergency and should be followed up as soon as possible or go to the Emergency Department if any problems should occur.  Please show the CHEMOTHERAPY ALERT  CARD or IMMUNOTHERAPY ALERT CARD at check-in to the Emergency Department and triage nurse.  Should you have questions after your visit or need to cancel or reschedule your appointment, please contact [facility] - A DEPT OF [facility]  [phone] and follow the prompts.  Office hours are 8:00 a.m. to 4:30 p.m. Monday - Friday. Please note that voicemails left after 4:00 p.m. may not be returned until the following business day.  We are closed weekends and major holidays. You have access to a nurse at all times for urgent questions. Please call the main number to the clinic [phone] and follow the prompts.  For any non-urgent questions, you may also contact your provider using MyChart. We now offer e-Visits for anyone 47 and older to request care online for non-urgent symptoms. For details visit [email or web address].   Also download the MyChart app! Go to the app store, search MyChart, open the app, select [facility], and log in with your MyChart username and password.

## 2024-06-24 LAB — CEA: CEA: 245 ng/mL — ABNORMAL HIGH (ref 0.0–4.7)

## 2024-06-25 ENCOUNTER — Inpatient Hospital Stay

## 2024-06-25 ENCOUNTER — Encounter

## 2024-06-25 ENCOUNTER — Encounter: Payer: Self-pay | Admitting: Oncology

## 2024-06-25 VITALS — BP 92/53 | HR 60 | Temp 97.9°F | Resp 18

## 2024-06-25 DIAGNOSIS — C184 Malignant neoplasm of transverse colon: Secondary | ICD-10-CM

## 2024-06-25 MED ORDER — PEGFILGRASTIM-JMDB 6 MG/0.6ML ~~LOC~~ SOSY: 6.0000 mg | PREFILLED_SYRINGE | Freq: Once | SUBCUTANEOUS | Status: DC

## 2024-06-26 ENCOUNTER — Inpatient Hospital Stay

## 2024-06-26 ENCOUNTER — Encounter: Payer: Self-pay | Admitting: Oncology

## 2024-06-26 DIAGNOSIS — Z5111 Encounter for antineoplastic chemotherapy: Secondary | ICD-10-CM | POA: Diagnosis not present

## 2024-06-26 DIAGNOSIS — C184 Malignant neoplasm of transverse colon: Secondary | ICD-10-CM

## 2024-06-26 MED ORDER — DIPHENHYDRAMINE HCL 50 MG/ML IJ SOLN
25.0000 mg | Freq: Once | INTRAMUSCULAR | Status: DC
  Filled 2024-06-26: qty 1

## 2024-06-26 MED ORDER — SODIUM CHLORIDE 0.9% IV SOLUTION
250.0000 mL | INTRAVENOUS | Status: DC
  Administered 2024-06-26: 100 mL via INTRAVENOUS
  Filled 2024-06-26: qty 250

## 2024-06-26 MED ORDER — ACETAMINOPHEN 325 MG PO TABS
650.0000 mg | ORAL_TABLET | Freq: Once | ORAL | Status: AC
  Administered 2024-06-26: 650 mg via ORAL
  Filled 2024-06-26: qty 2

## 2024-06-26 NOTE — Progress Notes (Signed)
 [facility] CSW Progress Note  Visual merchandiser met with patient to follow-up on advance directives questions.    Interventions: Provided education and assistance to client regarding Advanced Directives. Patient to review the document and contact CSW when he would like to complete.  Patient appeared to respond positively to life review.  His faith is very important to him, as well as, keeping a positive attitude.      Follow Up Plan:  Patient will contact CSW with any support or resource needs    Macario CHRISTELLA Au, LCSW Clinical Social Worker [facility]    Patient is participating in a Managed Medicaid Plan:  Yes

## 2024-06-27 LAB — BPAM RBC
Blood Product Expiration Date: 202509052359
Blood Product Expiration Date: 202509112359
ISSUE DATE / TIME: 202508220900
ISSUE DATE / TIME: 202508221116
Unit Type and Rh: 5100
Unit Type and Rh: 9500

## 2024-06-27 LAB — TYPE AND SCREEN
ABO/RH(D): O POS
Antibody Screen: NEGATIVE
Unit division: 0
Unit division: 0

## 2024-06-30 ENCOUNTER — Ambulatory Visit: Admitting: Oncology

## 2024-06-30 ENCOUNTER — Ambulatory Visit

## 2024-06-30 ENCOUNTER — Other Ambulatory Visit

## 2024-07-02 ENCOUNTER — Encounter

## 2024-07-07 ENCOUNTER — Ambulatory Visit

## 2024-07-07 ENCOUNTER — Other Ambulatory Visit

## 2024-07-07 ENCOUNTER — Ambulatory Visit: Admitting: Oncology

## 2024-07-09 ENCOUNTER — Encounter

## 2024-07-10 ENCOUNTER — Telehealth: Payer: Self-pay | Admitting: *Deleted

## 2024-07-10 NOTE — Telephone Encounter (Signed)
 Patient having some urology issues with his urination.  He does have a urology doctor and he called the office and they are off today.  I spoke with Dr. Georgina again and he said that he should wait till Monday and call back to the urologist and if it gets worse you can go to the urgent care or to the ER.  Patient says that he will try first thing on Monday to get in touch with the urologist

## 2024-07-14 ENCOUNTER — Telehealth: Payer: Self-pay | Admitting: *Deleted

## 2024-07-14 ENCOUNTER — Inpatient Hospital Stay

## 2024-07-14 ENCOUNTER — Inpatient Hospital Stay (HOSPITAL_BASED_OUTPATIENT_CLINIC_OR_DEPARTMENT_OTHER): Admitting: Oncology

## 2024-07-14 ENCOUNTER — Inpatient Hospital Stay: Attending: Oncology

## 2024-07-14 ENCOUNTER — Other Ambulatory Visit: Payer: Self-pay

## 2024-07-14 ENCOUNTER — Encounter: Payer: Self-pay | Admitting: Oncology

## 2024-07-14 VITALS — BP 97/62 | HR 55 | Temp 97.6°F | Resp 16 | Wt 176.0 lb

## 2024-07-14 DIAGNOSIS — G629 Polyneuropathy, unspecified: Secondary | ICD-10-CM | POA: Insufficient documentation

## 2024-07-14 DIAGNOSIS — R1030 Lower abdominal pain, unspecified: Secondary | ICD-10-CM | POA: Diagnosis not present

## 2024-07-14 DIAGNOSIS — F419 Anxiety disorder, unspecified: Secondary | ICD-10-CM | POA: Insufficient documentation

## 2024-07-14 DIAGNOSIS — R350 Frequency of micturition: Secondary | ICD-10-CM | POA: Insufficient documentation

## 2024-07-14 DIAGNOSIS — K649 Unspecified hemorrhoids: Secondary | ICD-10-CM | POA: Diagnosis not present

## 2024-07-14 DIAGNOSIS — N179 Acute kidney failure, unspecified: Secondary | ICD-10-CM | POA: Insufficient documentation

## 2024-07-14 DIAGNOSIS — D696 Thrombocytopenia, unspecified: Secondary | ICD-10-CM | POA: Diagnosis not present

## 2024-07-14 DIAGNOSIS — E871 Hypo-osmolality and hyponatremia: Secondary | ICD-10-CM | POA: Diagnosis not present

## 2024-07-14 DIAGNOSIS — N189 Chronic kidney disease, unspecified: Secondary | ICD-10-CM | POA: Diagnosis not present

## 2024-07-14 DIAGNOSIS — C189 Malignant neoplasm of colon, unspecified: Secondary | ICD-10-CM | POA: Insufficient documentation

## 2024-07-14 DIAGNOSIS — R197 Diarrhea, unspecified: Secondary | ICD-10-CM | POA: Diagnosis not present

## 2024-07-14 DIAGNOSIS — D472 Monoclonal gammopathy: Secondary | ICD-10-CM | POA: Insufficient documentation

## 2024-07-14 DIAGNOSIS — Z79631 Long term (current) use of antimetabolite agent: Secondary | ICD-10-CM | POA: Diagnosis not present

## 2024-07-14 DIAGNOSIS — Z79634 Long term (current) use of topoisomerase inhibitor: Secondary | ICD-10-CM | POA: Diagnosis not present

## 2024-07-14 DIAGNOSIS — Z8673 Personal history of transient ischemic attack (TIA), and cerebral infarction without residual deficits: Secondary | ICD-10-CM | POA: Diagnosis not present

## 2024-07-14 DIAGNOSIS — I959 Hypotension, unspecified: Secondary | ICD-10-CM | POA: Insufficient documentation

## 2024-07-14 DIAGNOSIS — Z5111 Encounter for antineoplastic chemotherapy: Secondary | ICD-10-CM | POA: Diagnosis present

## 2024-07-14 DIAGNOSIS — Z5189 Encounter for other specified aftercare: Secondary | ICD-10-CM | POA: Diagnosis not present

## 2024-07-14 DIAGNOSIS — C787 Secondary malignant neoplasm of liver and intrahepatic bile duct: Secondary | ICD-10-CM | POA: Insufficient documentation

## 2024-07-14 DIAGNOSIS — D649 Anemia, unspecified: Secondary | ICD-10-CM | POA: Insufficient documentation

## 2024-07-14 DIAGNOSIS — D709 Neutropenia, unspecified: Secondary | ICD-10-CM | POA: Insufficient documentation

## 2024-07-14 DIAGNOSIS — C61 Malignant neoplasm of prostate: Secondary | ICD-10-CM | POA: Insufficient documentation

## 2024-07-14 DIAGNOSIS — C184 Malignant neoplasm of transverse colon: Secondary | ICD-10-CM

## 2024-07-14 DIAGNOSIS — C9000 Multiple myeloma not having achieved remission: Secondary | ICD-10-CM | POA: Diagnosis not present

## 2024-07-14 LAB — URINALYSIS, COMPLETE (UACMP) WITH MICROSCOPIC
Bilirubin Urine: NEGATIVE
Glucose, UA: NEGATIVE mg/dL
Hgb urine dipstick: NEGATIVE
Ketones, ur: NEGATIVE mg/dL
Leukocytes,Ua: NEGATIVE
Nitrite: NEGATIVE
Protein, ur: NEGATIVE mg/dL
Specific Gravity, Urine: 1.011 (ref 1.005–1.030)
pH: 5 (ref 5.0–8.0)

## 2024-07-14 LAB — CMP (CANCER CENTER ONLY)
ALT: 13 U/L (ref 0–44)
AST: 25 U/L (ref 15–41)
Albumin: 3.9 g/dL (ref 3.5–5.0)
Alkaline Phosphatase: 223 U/L — ABNORMAL HIGH (ref 38–126)
Anion gap: 10 (ref 5–15)
BUN: 78 mg/dL — ABNORMAL HIGH (ref 8–23)
CO2: 19 mmol/L — ABNORMAL LOW (ref 22–32)
Calcium: 9.1 mg/dL (ref 8.9–10.3)
Chloride: 108 mmol/L (ref 98–111)
Creatinine: 3.21 mg/dL — ABNORMAL HIGH (ref 0.61–1.24)
GFR, Estimated: 20 mL/min — ABNORMAL LOW (ref >60)
Glucose, Bld: 158 mg/dL — ABNORMAL HIGH (ref 70–99)
Potassium: 4.2 mmol/L (ref 3.5–5.1)
Sodium: 137 mmol/L (ref 135–145)
Total Bilirubin: 1.2 mg/dL (ref 0.0–1.2)
Total Protein: 8.1 g/dL (ref 6.5–8.1)

## 2024-07-14 LAB — CBC WITH DIFFERENTIAL (CANCER CENTER ONLY)
Abs Immature Granulocytes: 0 K/uL (ref 0.00–0.07)
Basophils Absolute: 0 K/uL (ref 0.0–0.1)
Basophils Relative: 1 %
Eosinophils Absolute: 0.1 K/uL (ref 0.0–0.5)
Eosinophils Relative: 8 %
HCT: 22.4 % — ABNORMAL LOW (ref 39.0–52.0)
Hemoglobin: 7.1 g/dL — ABNORMAL LOW (ref 13.0–17.0)
Immature Granulocytes: 0 %
Lymphocytes Relative: 19 %
Lymphs Abs: 0.3 K/uL — ABNORMAL LOW (ref 0.7–4.0)
MCH: 29 pg (ref 26.0–34.0)
MCHC: 31.7 g/dL (ref 30.0–36.0)
MCV: 91.4 fL (ref 80.0–100.0)
Monocytes Absolute: 0.6 K/uL (ref 0.1–1.0)
Monocytes Relative: 39 %
Neutro Abs: 0.5 K/uL — ABNORMAL LOW (ref 1.7–7.7)
Neutrophils Relative %: 33 %
Platelet Count: 104 K/uL — ABNORMAL LOW (ref 150–400)
RBC: 2.45 MIL/uL — ABNORMAL LOW (ref 4.22–5.81)
RDW: 19.9 % — ABNORMAL HIGH (ref 11.5–15.5)
WBC Count: 1.6 K/uL — ABNORMAL LOW (ref 4.0–10.5)
nRBC: 0 % (ref 0.0–0.2)

## 2024-07-14 LAB — MAGNESIUM: Magnesium: 1.7 mg/dL (ref 1.7–2.4)

## 2024-07-14 NOTE — Telephone Encounter (Signed)
 Call placed to patient regarding UA results, per Dr. Jacobo UA is negative. Dr. Jacobo requested we reach out to urology for patient to be seen there. RN left vm with office regarding need for appointment with Dr. Francisca due to increased urinary frequency. RN called to update patient about results and that call was placed to urology to request appointment. Urology will reach out to patient with appointment.

## 2024-07-14 NOTE — Progress Notes (Signed)
 [facility]  [phone]) [phone] [phone]) [phone]  ID: Chris George OB: [birth year]  MR#: [id number]  RDW#:[id number]  Patient Care Team: Jeffie Cheryl FORBES, MD as PCP - General (Family Medicine) [name], MD as Consulting Physician (Oncology) [name], MD as Referring Physician (Neurology) Marcelino Gales, MD as Consulting Physician (Nephrology) Jama Margery ORN, MD as Referring Physician (Cardiology) Lenn Aran, MD as Consulting Physician (Radiation Oncology)  CHIEF COMPLAINT: Recurrent colon cancer, smoldering myeloma, prostate cancer.  INTERVAL HISTORY: Patient returns to clinic today for further evaluation and consideration of his next cycle of FOLFIRI.  He has noticed increased urinary frequency, but otherwise feels well.  He continues to have chronic weakness and fatigue.  He has a mild expressive aphasia and a chronic peripheral neuropathy, but no other neurologic complaints.  He denies any recent fevers.  He has no chest pain, cough, shortness of breath, or hemoptysis.  He denies any nausea, vomiting, constipation, or diarrhea.  He does not complain of problems with his hemorrhoids today.  He has no further melena or hematochezia.  He has no other urinary complaints.  Patient offers no further specific complaints today.  REVIEW OF SYSTEMS:   Review of Systems  Constitutional:  Positive for malaise/fatigue. Negative for fever and weight loss.  Respiratory: Negative.  Negative for cough, hemoptysis and shortness of breath.   Cardiovascular: Negative.  Negative for chest pain and leg swelling.  Gastrointestinal: Negative.  Negative for blood in stool, diarrhea, melena, nausea and vomiting.  Genitourinary:  Positive for frequency. Negative for hematuria.  Musculoskeletal: Negative.  Negative for back pain and joint pain.  Skin: Negative.  Negative for rash.  Neurological:  Positive for tingling, sensory change and weakness. Negative for  dizziness, focal weakness and headaches.  Psychiatric/Behavioral: Negative.  Negative for depression and hallucinations. The patient is not nervous/anxious.   Bilateral  As per HPI. Otherwise, a complete review of systems is negative.  PAST MEDICAL HISTORY: Past Medical History:  Diagnosis Date   A-fib (HCC)    Anemia    Arthritis    ankles   Cancer (HCC)    multiple myeloma   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HCC) 10/26/2019   Cardiomyopathy (HCC)    Chemotherapy-induced peripheral neuropathy (HCC)    CHF (congestive heart failure) (HCC)    Depression    Dysrhythmia    atrial fib   History of CVA (cerebrovascular accident)    HLD (hyperlipidemia)    Hypercholesteremia    Hypertension    Moderate tricuspid insufficiency    Multiple myeloma (HCC)    not being treated right now per pt 11/11/19   Pneumonia    Sleep apnea    No CPAP.  OSA resolved with wt loss.    PAST SURGICAL HISTORY: Past Surgical History:  Procedure Laterality Date   ATRIAL ABLATION SURGERY     CARDIAC SURGERY     A-Fib Ablations   COLONOSCOPY N/A 03/17/2024   Procedure: COLONOSCOPY;  Surgeon: Jinny Carmine, MD;  Location: [facility] ENDOSCOPY;  Service: Endoscopy;  Laterality: N/A;   COLONOSCOPY WITH PROPOFOL  N/A 10/26/2019   Procedure: COLONOSCOPY WITH BIOPSY;  Surgeon: Jinny Carmine, MD;  Location: [facility]GERY [facility];  Service: Endoscopy;  Laterality: N/A;   COLONOSCOPY WITH PROPOFOL  N/A 09/03/2022   Procedure: COLONOSCOPY WITH PROPOFOL ;  Surgeon: Jinny Carmine, MD;  Location: [facility];  Service: Endoscopy;  Laterality: N/A;   ESOPHAGOGASTRODUODENOSCOPY N/A 03/17/2024   Procedure: GI Bleeding;  Surgeon: Jinny Carmine, MD;  Location:  [facility] ENDOSCOPY;  Service: Endoscopy;  Laterality: N/A;  GI Bleeding, EGD   ESOPHAGOGASTRODUODENOSCOPY (EGD) WITH PROPOFOL  N/A 10/26/2019   Procedure: ESOPHAGOGASTRODUODENOSCOPY (EGD) WITH BIOPSY;  Surgeon: Jinny Carmine, MD;  Location: [facility];  Service: Endoscopy;   Laterality: N/A;  sleep apnea   LAPAROSCOPIC PARTIAL COLECTOMY Right 11/17/2019   Procedure: LAPAROSCOPIC PARTIAL COLECTOMY RIGHT EXTENDED;  Surgeon: Desiderio Schanz, MD;  Location: [facility] ORS;  Service: General;  Laterality: Right;   PORTACATH PLACEMENT N/A 12/28/2019   Procedure: INSERTION PORT-A-CATH Left subclavian;  Surgeon: Desiderio Schanz, MD;  Location: [facility] ORS;  Service: General;  Laterality: N/A;    FAMILY HISTORY: Family History  Problem Relation Age of Onset   Healthy Mother     ADVANCED DIRECTIVES (Y/N):  N  HEALTH MAINTENANCE: Social History   Tobacco Use   Smoking status: Never    Passive exposure: Never   Smokeless tobacco: Never  Vaping Use   Vaping status: Never Used  Substance Use Topics   Alcohol use: Not Currently   Drug use: Never     Colonoscopy:  PAP:  Bone density:  Lipid panel:  No Known Allergies  Current Outpatient Medications  Medication Sig Dispense Refill   apixaban (ELIQUIS) 5 MG TABS tablet Take 5 mg by mouth 2 (two) times daily.     atorvastatin  (LIPITOR) 80 MG tablet Take 80 mg by mouth daily.     bumetanide  (BUMEX ) 2 MG tablet Take 2 mg by mouth once a week. Taking one in the morning.     colchicine  0.6 MG tablet Take 1 tablet (0.6 mg total) by mouth daily as needed. Until flare resolves.     hydrocortisone  (ANUSOL -HC) 2.5 % rectal cream Place 1 Application rectally 2 (two) times daily. 30 g 0   lidocaine -prilocaine  (EMLA ) cream Apply 1 Application topically daily.     magnesium  chloride (SLOW-MAG) 64 MG TBEC SR tablet Take 1 tablet (64 mg total) by mouth daily. 60 tablet 1   ondansetron  (ZOFRAN ) 8 MG tablet Take 1 tablet (8 mg total) by mouth every 8 (eight) hours as needed for nausea or vomiting. 20 tablet 0   oxyCODONE -acetaminophen  (PERCOCET/ROXICET) 5-325 MG tablet Take 1-2 tablets by mouth every 6 (six) hours as needed for severe pain (pain score 7-10). 60 tablet 0   prochlorperazine  (COMPAZINE ) 10 MG tablet Take 1 tablet (10 mg total)  by mouth every 6 (six) hours as needed for nausea or vomiting. 30 tablet 0   spironolactone (ALDACTONE) 25 MG tablet Take 1 tablet by mouth every morning.     tamsulosin  (FLOMAX ) 0.4 MG CAPS capsule Take 1 capsule (0.4 mg total) by mouth daily after supper. 30 capsule 6   allopurinol  (ZYLOPRIM ) 100 MG tablet Take 200 mg by mouth daily. (Patient not taking: Reported on 07/14/2024)     No current facility-administered medications for this visit.   Facility-Administered Medications Ordered in Other Visits  Medication Dose Route Frequency Provider Last Rate Last Admin   heparin  lock flush 100 unit/mL  500 Units Intravenous Once Preslea Rhodus J, MD        OBJECTIVE: Vitals:   07/14/24 0839  BP: 97/62  Pulse: (!) 55  Resp: 16  Temp: 97.6 F (36.4 C)  SpO2: 100%       Body mass index is 25.99 kg/m.    ECOG FS:1 - Symptomatic but completely ambulatory  General: Well-developed, well-nourished, no acute distress. Eyes: Pink conjunctiva, anicteric sclera. HEENT: Normocephalic, moist mucous membranes. Lungs: No audible wheezing or coughing.  Heart: Regular rate and rhythm. Abdomen: Soft, nontender, no obvious distention. Musculoskeletal: No edema, cyanosis, or clubbing. Neuro: Alert, answering all questions appropriately. Cranial nerves grossly intact. Skin: No rashes or petechiae noted. Psych: Normal affect.  LAB RESULTS:  Lab Results  Component Value Date   NA 137 07/14/2024   K 4.2 07/14/2024   CL 108 07/14/2024   CO2 19 (L) 07/14/2024   GLUCOSE 158 (H) 07/14/2024   BUN 78 (H) 07/14/2024   CREATININE 3.21 (H) 07/14/2024   CALCIUM  9.1 07/14/2024   PROT 8.1 07/14/2024   ALBUMIN 3.9 07/14/2024   AST 25 07/14/2024   ALT 13 07/14/2024   ALKPHOS 223 (H) 07/14/2024   BILITOT 1.2 07/14/2024   GFRNONAA 20 (L) 07/14/2024   GFRAA >60 07/29/2020    Lab Results  Component Value Date   WBC 1.6 (L) 07/14/2024   NEUTROABS 0.5 (L) 07/14/2024   HGB 7.1 (L) 07/14/2024   HCT 22.4  (L) 07/14/2024   MCV 91.4 07/14/2024   PLT 104 (L) 07/14/2024   Lab Results  Component Value Date   IRON 79 08/13/2023   TIBC 234 (L) 08/13/2023   IRONPCTSAT 34 08/13/2023   Lab Results  Component Value Date   FERRITIN 686 (H) 09/03/2023     STUDIES: No results found.   ONCOLOGY HISTORY: Patient completed cycle 9 of adjuvant FOLFOX on June 03, 2020.  Given his difficulties with treatment and declining performance status, treatment was discontinued altogether. Colonoscopy on September 03, 2022 did not reveal any significant pathology and recommendation was to repeat in 3 years.    ASSESSMENT: Recurrent colon cancer, smoldering myeloma, prostate cancer.  PLAN:    Recurrent colon cancer: PET scan results from December 30, 2023 reviewed independently with progressive disease of hypermetabolic retrocaval mass, but no other evidence of metastatic disease.  Patient has completed SBRT to this lesion.  Repeat PET scan on April 14, 2024 reviewed independently with continued progression of disease now with evidence of liver metastasis.  Since reinitiating treatment, patient CEA has trended down to 245.  Delay cycle 14 of dose reduced FOLFIRI secondary to neutropenia.  Will likely have to add an Udenyca  with subsequent treatments.  Return to clinic in 2 days for 1 unit of packed red blood cells.  And then in 1 week for further evaluation and reconsideration of cycle 14.     Prostate cancer: Gleason's 3+4.  Patient's PSA increased and peaked at 39.34, but since completing treatment it continues to trend down.  His most recent result is 1.61.  Nuclear medicine bone scan on December 13, 2022 was reported as negative.  PSMA PET per radiation oncology from Mar 21, 2023 did not reveal metastatic disease.  Patient completed XRT for local control disease on May 30, 2023.  Can consider Eligard in the future.  Continue follow-up with urology as indicated.   Smoldering myeloma: Chronic and unchanged.  Patient was  initially diagnosed and treated in [place], [place]  and treated with single agent Velcade in approximately 2013.  He declined maintenance treatment or referral for bone marrow transplant.  His M spike has ranged from 1.1-2.0 since July 2020.  His most recent result from April 28, 2024 is 1.8.  Over the same timeframe his IgG component has ranged from [phone].  His most recent result is 2635.  Finally, his kappa free light chains have ranged from 24.8 -75.3.  His most recent result was elevated at 98.5, but his lambda free light chains are also elevated.  His kappa/lambda free light chain ratio was within normal limits.  Nuclear med bone scan as above.  His most recent bone marrow biopsy completed on May 28, 2019 revealed only 10 to 15% plasma cells with no clonality reported.  Cytogenetics were also reported as normal.    Anemia: Hemoglobin decreased, but stable at 7.1 despite recent transfusion.  Return to clinic on Thursday for 1 unit packed red blood cells.  Delay treatment as above.   Neutropenia: Delay treatment as above.  Will add Udenyca  with pump off for future treatments.  Continue with dose reduced FOLFIRI as above.  Thrombocytopenia: Platelets mildly improved to 104.  Monitor.   Renal insufficiency: GFR decreased, but stable at 20.  Neither irinotecan  or 5-FU need to be dosed reduced in renal insufficiency.  Continue follow-up with nephrology as scheduled. Peripheral neuropathy: Chronic and unchanged.  Patient reports gabapentin  did not help.  Continue Lyrica  50 mg daily and was previously given a referral to neurology.   Hypotension: Chronic and unchanged.  Patient is asymptomatic.  Monitor. Coping/anxiety: Patient was previously given a referral to [facility].  CHF exacerbation/edema: Resolved.  Patient was recently at [facility] for nearly a month.  Continue follow-up with cardiology and nephrology.  Monitor closely with fluid infusion today and blood transfusion tomorrow. Diarrhea:  Resolved.  Continue Imodium as needed. Hemorrhoids: Patient does not complain of this today.  Continue OTC remedies as needed. Urinary frequency: Will get UA to further evaluate.  Consider referral back to urology.   Patient expressed understanding and was in agreement with this plan. He also understands that He can call clinic at any time with any questions, concerns, or complaints.    Evalene JINNY Reusing, MD   07/14/2024 9:23 AM

## 2024-07-15 ENCOUNTER — Other Ambulatory Visit: Payer: Self-pay | Admitting: *Deleted

## 2024-07-15 ENCOUNTER — Telehealth: Payer: Self-pay | Admitting: *Deleted

## 2024-07-15 ENCOUNTER — Other Ambulatory Visit

## 2024-07-15 DIAGNOSIS — D649 Anemia, unspecified: Secondary | ICD-10-CM

## 2024-07-15 LAB — URINE CULTURE: Culture: 20000 — AB

## 2024-07-15 LAB — CEA: CEA: 269 ng/mL — ABNORMAL HIGH (ref 0.0–4.7)

## 2024-07-15 NOTE — Telephone Encounter (Signed)
 Patient got a call and he missed it and he would like to have that person call back, he also says that he is set up for a transfusion of blood tomorrow. I spoke to the patient and told him that the hold tube broke and we wanted to have him come in 8 am tom. And then when ready for the transfusion he will have it on same day and he is ok with the change.

## 2024-07-16 ENCOUNTER — Encounter

## 2024-07-16 ENCOUNTER — Inpatient Hospital Stay

## 2024-07-16 DIAGNOSIS — C184 Malignant neoplasm of transverse colon: Secondary | ICD-10-CM

## 2024-07-16 DIAGNOSIS — D649 Anemia, unspecified: Secondary | ICD-10-CM

## 2024-07-16 DIAGNOSIS — Z5111 Encounter for antineoplastic chemotherapy: Secondary | ICD-10-CM | POA: Diagnosis not present

## 2024-07-16 LAB — PREPARE RBC (CROSSMATCH)

## 2024-07-16 MED ORDER — ACETAMINOPHEN 325 MG PO TABS
650.0000 mg | ORAL_TABLET | Freq: Once | ORAL | Status: AC
  Administered 2024-07-16: 650 mg via ORAL
  Filled 2024-07-16: qty 2

## 2024-07-16 MED ORDER — DIPHENHYDRAMINE HCL 50 MG/ML IJ SOLN: 25.0000 mg | Freq: Once | INTRAMUSCULAR | Status: DC

## 2024-07-16 MED ORDER — SODIUM CHLORIDE 0.9% IV SOLUTION
250.0000 mL | INTRAVENOUS | Status: DC
  Administered 2024-07-16: 100 mL via INTRAVENOUS
  Filled 2024-07-16: qty 250

## 2024-07-16 NOTE — Patient Instructions (Signed)
 Blood Transfusion, Adult A blood transfusion is a procedure in which you receive blood through an IV tube. You may need this procedure because of: A bleeding disorder. An illness. An injury. A surgery. The blood may come from someone else (a donor). You may also be able to donate blood for yourself before a surgery. The blood given in a transfusion may be made up of different types of cells. You may get: Red blood cells. These carry oxygen to the cells in the body. Platelets. These help your blood to clot. Plasma. This is the liquid part of your blood. It carries proteins and other substances through the body. White blood cells. These help you fight infections. If you have a clotting disorder, you may also get other types of blood products. Depending on the type of blood product, this procedure may take 1-4 hours to complete. Tell your doctor about: Any bleeding problems you have. Any reactions you have had during a blood transfusion in the past. Any allergies you have. All medicines you are taking, including vitamins, herbs, eye drops, creams, and over-the-counter medicines. Any surgeries you have had. Any medical conditions you have. Whether you are pregnant or may be pregnant. What are the risks? Talk with your health care provider about risks. The most common problems include: A mild allergic reaction. This includes red, swollen areas of skin (hives) and itching. Fever or chills. This may be the body's response to new blood cells received. This may happen during or up to 4 hours after the transfusion. More serious problems may include: A serious allergic reaction. This includes breathing trouble or swelling around the face and lips. Too much fluid in the lungs. This may cause breathing problems. Lung injury. This causes breathing trouble and low oxygen in the blood. This can happen within hours of the transfusion or days later. Too much iron . This can happen after getting many blood  transfusions over a period of time. An infection or virus passed through the blood. This is rare. Donated blood is carefully tested before it is given. Your body's defense system (immune system) trying to attack the new blood cells. This is rare. Symptoms may include fever, chills, nausea, low blood pressure, and low back or chest pain. Donated cells attacking healthy tissues. This is rare. What happens before the procedure? You will have a blood test to find out your blood type. The test also finds out what type of blood your body will accept and matches it to the donor type. If you are going to have a planned surgery, you may be able to donate your own blood. This may be done in case you need a transfusion. You will have your temperature, blood pressure, and pulse checked. You may receive medicine to help prevent an allergic reaction. This may be done if you have had a reaction to a transfusion before. This medicine may be given to you by mouth or through an IV tube. What happens during the procedure?  An IV tube will be put into one of your veins. The bag of blood will be attached to your IV tube. Then, the blood will enter through your vein. Your temperature, blood pressure, and pulse will be checked often. This is done to find early signs of a transfusion reaction. Tell your nurse right away if you have any of these symptoms: Shortness of breath or trouble breathing. Chest or back pain. Fever or chills. Red, swollen areas of skin or itching. If you have any signs  or symptoms of a reaction, your transfusion will be stopped. You may also be given medicine. When the transfusion is finished, your IV tube will be taken out. Pressure may be put on the IV site for a few minutes. A bandage (dressing) will be put on the IV site. The procedure may vary among doctors and hospitals. What happens after the procedure? You will be monitored until you leave the hospital or clinic. This includes  checking your temperature, blood pressure, pulse, breathing rate, and blood oxygen level. Your blood may be tested to see how you have responded to the transfusion. You may be warmed with fluids or blankets. This is done to keep the temperature of your body normal. If you have your procedure in an outpatient setting, you will be told whom to contact to report any reactions. Where to find more information Visit the [employer]: redcross.org Summary A blood transfusion is a procedure in which you receive blood through an IV tube. The blood you are given may be made up of different blood cells. You may receive red blood cells, platelets, plasma, or white blood cells. Your temperature, blood pressure, and pulse will be checked often. After the procedure, your blood may be tested to see how you have responded. This information is not intended to replace advice given to you by your health care provider. Make sure you discuss any questions you have with your health care provider. Document Revised: 01/19/2022 Document Reviewed: 01/19/2022 Elsevier Patient Education  2024 [employer].

## 2024-07-16 NOTE — Progress Notes (Signed)
 Dr Jacobo notified of pt BP.  Per pt, BP runs on the low side.  Pt has no complaints or concerns.  Per Dr Jacobo, continue to monitor.  No new orders.    BP mildly improved post transfusion.  Pt advised to monitor BP at home.  Pt verablized understanding.

## 2024-07-17 ENCOUNTER — Ambulatory Visit: Admitting: Urology

## 2024-07-17 VITALS — BP 91/52 | HR 47 | Ht 69.0 in | Wt 175.0 lb

## 2024-07-17 DIAGNOSIS — R3915 Urgency of urination: Secondary | ICD-10-CM | POA: Diagnosis not present

## 2024-07-17 DIAGNOSIS — N184 Chronic kidney disease, stage 4 (severe): Secondary | ICD-10-CM

## 2024-07-17 DIAGNOSIS — R351 Nocturia: Secondary | ICD-10-CM | POA: Diagnosis not present

## 2024-07-17 DIAGNOSIS — R350 Frequency of micturition: Secondary | ICD-10-CM

## 2024-07-17 DIAGNOSIS — C61 Malignant neoplasm of prostate: Secondary | ICD-10-CM

## 2024-07-17 DIAGNOSIS — Z8546 Personal history of malignant neoplasm of prostate: Secondary | ICD-10-CM | POA: Diagnosis not present

## 2024-07-17 DIAGNOSIS — R399 Unspecified symptoms and signs involving the genitourinary system: Secondary | ICD-10-CM

## 2024-07-17 LAB — TYPE AND SCREEN
ABO/RH(D): O POS
Antibody Screen: NEGATIVE
Unit division: 0

## 2024-07-17 LAB — BLADDER SCAN AMB NON-IMAGING: Scan Result: 4

## 2024-07-17 LAB — BPAM RBC
Blood Product Expiration Date: 202510022359
ISSUE DATE / TIME: 202509110943
Unit Type and Rh: 5100

## 2024-07-17 MED ORDER — GEMTESA 75 MG PO TABS: 75.0000 mg | ORAL_TABLET | Freq: Every day | ORAL | Status: AC

## 2024-07-17 NOTE — Progress Notes (Signed)
   07/17/2024 9:42 AM   Chris George [birth year] [id number]  Reason for visit: Worsening urinary symptoms, history of prostate cancer, low scrotal hematoma  History: Numerous comorbidities including history of stroke,, heart failure, stage III colon cancer, CKD stage IV History of spontaneous left scrotal hematoma during admission at [facility] for heart failure, managed conservatively History of high-volume favorable intermediate risk prostate cancer with initial PSA of 15, radiation completed July 2024, did not receive ADT, PSA has decreased ED, not currently using medications  Physical Exam: BP (!) 91/52   Pulse (!) 47   Ht 5' 9 (1.753 m)   Wt 175 lb (79.4 kg)   BMI 25.84 kg/m  Left scrotal hematoma essentially resolved, nontender  Imaging/labs: Reviewed in epic September 2025 CEA 269 July 2025 PSA 1.65 and stable over the last 6 months Creatinine 3.21, eGFR 20 PET scan June 2025 with progression of colon cancer with lymphadenopathy and liver mets, bladder decompressed Recent urinalysis benign  Today: Reports 5 weeks of worsening urinary symptoms with urgency, frequency, nocturia 4-5 times per night from baseline of 2.  Denies any dysuria or gross hematuria.  Good strength of stream Urinalysis benign, PVR normal at 4ml  Plan:   Reassurance provided regarding stable low PSA after treatment for prostate cancer Reassurance provided regarding improved left scrotal hematoma We reviewed possible etiologies of his urinary symptoms including history of radiation for prostate cancer, overactive bladder, urethral stricture, cystitis from chemotherapy.  We discussed options including a trial of an OAB medication in addition to his baseline Flomax , or cystoscopy.  Using shared decision making he opted for Gemtesa  samples with close follow-up in 4 to 6 weeks for cystoscopy, that visit can be an office visit if symptoms resolve on Gemtesa    Chris JAYSON Burnet, MD  [facility] [address] [place], [place] [address] [phone]

## 2024-07-17 NOTE — Patient Instructions (Signed)
 In symptoms resolve with Gemtesa  just cancel your appointment for Cystoscope. And call us  back to refill your Gemtesa   Cystoscopy Cystoscopy is a procedure that is used to help diagnose and sometimes treat conditions that affect the lower urinary tract. The lower urinary tract includes the bladder and the urethra. The urethra is the tube that drains urine from the bladder. Cystoscopy is done using a thin, tube-shaped instrument with a light and camera at the end (cystoscope). The cystoscope may be hard or flexible, depending on the goal of the procedure. The cystoscope is inserted through the urethra, into the bladder. Cystoscopy may be recommended if you have: Urinary tract infections that keep coming back. Blood in the urine (hematuria). An inability to control when you urinate (urinary incontinence) or an overactive bladder. Unusual cells found in a urine sample. A blockage in the urethra, such as a urinary stone. Painful urination. An abnormality in the bladder found during an intravenous pyelogram (IVP) or CT scan. What are the risks? Generally, this is a safe procedure. However, problems may occur, including: Infection. Bleeding.  What happens during the procedure?  You will be given one or more of the following: A medicine to numb the area (local anesthetic). The area around the opening of your urethra will be cleaned. The cystoscope will be passed through your urethra into your bladder. Germ-free (sterile) fluid will flow through the cystoscope to fill your bladder. The fluid will stretch your bladder so that your health care provider can clearly examine your bladder walls. Your doctor will look at the urethra and bladder. The cystoscope will be removed The procedure may vary among health care providers  What can I expect after the procedure? After the procedure, it is common to have: Some soreness or pain in your urethra. Urinary symptoms. These include: Mild pain or burning  when you urinate. Pain should stop within a few minutes after you urinate. This may last for up to a few days after the procedure. A small amount of blood in your urine for several days. Feeling like you need to urinate but producing only a small amount of urine. Follow these instructions at home: General instructions Return to your normal activities as told by your health care provider.  Drink plenty of fluids after the procedure. Keep all follow-up visits as told by your health care provider. This is important. Contact a health care provider if you: Have pain that gets worse or does not get better with medicine, especially pain when you urinate lasting longer than 72 hours after the procedure. Have trouble urinating. Get help right away if you: Have blood clots in your urine. Have a fever or chills. Are unable to urinate. Summary Cystoscopy is a procedure that is used to help diagnose and sometimes treat conditions that affect the lower urinary tract. Cystoscopy is done using a thin, tube-shaped instrument with a light and camera at the end. After the procedure, it is common to have some soreness or pain in your urethra. It is normal to have blood in your urine after the procedure.  If you were prescribed an antibiotic medicine, take it as told by your health care provider.  This information is not intended to replace advice given to you by your health care provider. Make sure you discuss any questions you have with your health care provider. Document Revised: 10/14/2018 Document Reviewed: 10/14/2018 Elsevier Patient Education  2020 [employer].

## 2024-07-19 ENCOUNTER — Other Ambulatory Visit: Payer: Self-pay

## 2024-07-20 ENCOUNTER — Other Ambulatory Visit: Payer: Self-pay | Admitting: *Deleted

## 2024-07-20 DIAGNOSIS — C184 Malignant neoplasm of transverse colon: Secondary | ICD-10-CM

## 2024-07-20 NOTE — Progress Notes (Signed)
 hold

## 2024-07-21 ENCOUNTER — Ambulatory Visit: Admitting: Oncology

## 2024-07-21 ENCOUNTER — Ambulatory Visit

## 2024-07-21 ENCOUNTER — Other Ambulatory Visit

## 2024-07-21 ENCOUNTER — Inpatient Hospital Stay

## 2024-07-21 ENCOUNTER — Inpatient Hospital Stay (HOSPITAL_BASED_OUTPATIENT_CLINIC_OR_DEPARTMENT_OTHER): Admitting: Oncology

## 2024-07-21 VITALS — BP 99/46 | HR 52 | Temp 96.8°F | Resp 20 | Wt 182.0 lb

## 2024-07-21 DIAGNOSIS — Z5111 Encounter for antineoplastic chemotherapy: Secondary | ICD-10-CM | POA: Diagnosis not present

## 2024-07-21 DIAGNOSIS — C184 Malignant neoplasm of transverse colon: Secondary | ICD-10-CM

## 2024-07-21 LAB — CMP (CANCER CENTER ONLY)
ALT: 13 U/L (ref 0–44)
AST: 22 U/L (ref 15–41)
Albumin: 3.6 g/dL (ref 3.5–5.0)
Alkaline Phosphatase: 222 U/L — ABNORMAL HIGH (ref 38–126)
Anion gap: 7 (ref 5–15)
BUN: 87 mg/dL — ABNORMAL HIGH (ref 8–23)
CO2: 23 mmol/L (ref 22–32)
Calcium: 9 mg/dL (ref 8.9–10.3)
Chloride: 106 mmol/L (ref 98–111)
Creatinine: 3.14 mg/dL — ABNORMAL HIGH (ref 0.61–1.24)
GFR, Estimated: 21 mL/min — ABNORMAL LOW (ref >60)
Glucose, Bld: 113 mg/dL — ABNORMAL HIGH (ref 70–99)
Potassium: 4.4 mmol/L (ref 3.5–5.1)
Sodium: 136 mmol/L (ref 135–145)
Total Bilirubin: 1.1 mg/dL (ref 0.0–1.2)
Total Protein: 7.7 g/dL (ref 6.5–8.1)

## 2024-07-21 LAB — CBC WITH DIFFERENTIAL (CANCER CENTER ONLY)
Abs Immature Granulocytes: 0 K/uL (ref 0.00–0.07)
Basophils Absolute: 0 K/uL (ref 0.0–0.1)
Basophils Relative: 1 %
Eosinophils Absolute: 0.2 K/uL (ref 0.0–0.5)
Eosinophils Relative: 6 %
HCT: 23 % — ABNORMAL LOW (ref 39.0–52.0)
Hemoglobin: 7.3 g/dL — ABNORMAL LOW (ref 13.0–17.0)
Immature Granulocytes: 0 %
Lymphocytes Relative: 9 %
Lymphs Abs: 0.3 K/uL — ABNORMAL LOW (ref 0.7–4.0)
MCH: 28.6 pg (ref 26.0–34.0)
MCHC: 31.7 g/dL (ref 30.0–36.0)
MCV: 90.2 fL (ref 80.0–100.0)
Monocytes Absolute: 0.6 K/uL (ref 0.1–1.0)
Monocytes Relative: 18 %
Neutro Abs: 2.1 K/uL (ref 1.7–7.7)
Neutrophils Relative %: 66 %
Platelet Count: 91 K/uL — ABNORMAL LOW (ref 150–400)
RBC: 2.55 MIL/uL — ABNORMAL LOW (ref 4.22–5.81)
RDW: 21 % — ABNORMAL HIGH (ref 11.5–15.5)
WBC Count: 3.1 K/uL — ABNORMAL LOW (ref 4.0–10.5)
nRBC: 0 % (ref 0.0–0.2)

## 2024-07-21 LAB — MAGNESIUM: Magnesium: 1.8 mg/dL (ref 1.7–2.4)

## 2024-07-21 LAB — PREPARE RBC (CROSSMATCH)

## 2024-07-21 MED ORDER — FLUOROURACIL CHEMO INJECTION 2.5 GM/50ML
360.0000 mg/m2 | Freq: Once | INTRAVENOUS | Status: AC
  Administered 2024-07-21: 700 mg via INTRAVENOUS
  Filled 2024-07-21: qty 14

## 2024-07-21 MED ORDER — ATROPINE SULFATE 1 MG/ML IV SOLN
0.5000 mg | Freq: Once | INTRAVENOUS | Status: AC | PRN
  Administered 2024-07-21: 0.5 mg via INTRAVENOUS
  Filled 2024-07-21: qty 1

## 2024-07-21 MED ORDER — DEXAMETHASONE SODIUM PHOSPHATE 10 MG/ML IJ SOLN
10.0000 mg | Freq: Once | INTRAMUSCULAR | Status: AC
  Administered 2024-07-21: 10 mg via INTRAVENOUS
  Filled 2024-07-21: qty 1

## 2024-07-21 MED ORDER — SODIUM CHLORIDE 0.9 % IV SOLN
INTRAVENOUS | Status: DC
  Filled 2024-07-21: qty 250

## 2024-07-21 MED ORDER — PALONOSETRON HCL INJECTION 0.25 MG/5ML
0.2500 mg | Freq: Once | INTRAVENOUS | Status: AC
  Administered 2024-07-21: 0.25 mg via INTRAVENOUS
  Filled 2024-07-21: qty 5

## 2024-07-21 MED ORDER — SODIUM CHLORIDE 0.9 % IV SOLN
2160.0000 mg/m2 | INTRAVENOUS | Status: DC
  Administered 2024-07-21: 4300 mg via INTRAVENOUS
  Filled 2024-07-21: qty 86

## 2024-07-21 MED ORDER — SODIUM CHLORIDE 0.9 % IV SOLN
360.0000 mg/m2 | Freq: Once | INTRAVENOUS | Status: AC
  Administered 2024-07-21: 716 mg via INTRAVENOUS
  Filled 2024-07-21: qty 25

## 2024-07-21 MED ORDER — SODIUM CHLORIDE 0.9 % IV SOLN
160.0000 mg/m2 | Freq: Once | INTRAVENOUS | Status: AC
  Administered 2024-07-21: 300 mg via INTRAVENOUS
  Filled 2024-07-21: qty 15

## 2024-07-21 NOTE — Progress Notes (Signed)
 [facility]  Telephone:(336) [phone] Fax:(336) [phone]  ID: Chris George OB: [birth year]  MR#: [id number]  RDW#:[id number]  Patient Care Team: Jeffie Cheryl FORBES, MD as PCP - General (Family Medicine) [name], MD as Consulting Physician (Oncology) [name], MD as Referring Physician (Neurology) Marcelino Gales, MD as Consulting Physician (Nephrology) Jama Margery ORN, MD as Referring Physician (Cardiology) Lenn Aran, MD as Consulting Physician (Radiation Oncology)  CHIEF COMPLAINT: Recurrent colon cancer, smoldering myeloma, prostate cancer.  INTERVAL HISTORY: Patient returns to clinic today for further evaluation and reconsideration of his next cycle of FOLFIRI.  He has increased weakness and fatigue, but otherwise feels well.  He has a mild expressive aphasia and a chronic peripheral neuropathy, but no other neurologic complaints.  He denies any recent fevers.  He has no chest pain, cough, shortness of breath, or hemoptysis.  He denies any nausea, vomiting, constipation, or diarrhea.  He does not complain of problems with his hemorrhoids today.  He has no further melena or hematochezia.  He has no other urinary complaints.  Patient offers no further specific complaints today.  REVIEW OF SYSTEMS:   Review of Systems  Constitutional:  Positive for malaise/fatigue. Negative for fever and weight loss.  Respiratory: Negative.  Negative for cough, hemoptysis and shortness of breath.   Cardiovascular: Negative.  Negative for chest pain and leg swelling.  Gastrointestinal: Negative.  Negative for blood in stool, diarrhea, melena, nausea and vomiting.  Genitourinary: Negative.  Negative for frequency and hematuria.  Musculoskeletal: Negative.  Negative for back pain and joint pain.  Skin: Negative.  Negative for rash.  Neurological:  Positive for tingling, sensory change and weakness. Negative for dizziness, focal weakness and headaches.   Psychiatric/Behavioral: Negative.  Negative for depression and hallucinations. The patient is not nervous/anxious.   Bilateral  As per HPI. Otherwise, a complete review of systems is negative.  PAST MEDICAL HISTORY: Past Medical History:  Diagnosis Date   A-fib (HCC)    Anemia    Arthritis    ankles   Cancer (HCC)    multiple myeloma   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HCC) 10/26/2019   Cardiomyopathy (HCC)    Chemotherapy-induced peripheral neuropathy (HCC)    CHF (congestive heart failure) (HCC)    Depression    Dysrhythmia    atrial fib   History of CVA (cerebrovascular accident)    HLD (hyperlipidemia)    Hypercholesteremia    Hypertension    Moderate tricuspid insufficiency    Multiple myeloma (HCC)    not being treated right now per pt 11/11/19   Pneumonia    Sleep apnea    No CPAP.  OSA resolved with wt loss.    PAST SURGICAL HISTORY: Past Surgical History:  Procedure Laterality Date   ATRIAL ABLATION SURGERY     CARDIAC SURGERY     A-Fib Ablations   COLONOSCOPY N/A 03/17/2024   Procedure: COLONOSCOPY;  Surgeon: Jinny Carmine, MD;  Location: [facility] ENDOSCOPY;  Service: Endoscopy;  Laterality: N/A;   COLONOSCOPY WITH PROPOFOL  N/A 10/26/2019   Procedure: COLONOSCOPY WITH BIOPSY;  Surgeon: Jinny Carmine, MD;  Location: [facility]ERY [facility];  Service: Endoscopy;  Laterality: N/A;   COLONOSCOPY WITH PROPOFOL  N/A 09/03/2022   Procedure: COLONOSCOPY WITH PROPOFOL ;  Surgeon: Jinny Carmine, MD;  Location: [facility];  Service: Endoscopy;  Laterality: N/A;   ESOPHAGOGASTRODUODENOSCOPY N/A 03/17/2024   Procedure: GI Bleeding;  Surgeon: Jinny Carmine, MD;  Location: [facility] ENDOSCOPY;  Service: Endoscopy;  Laterality: N/A;  GI Bleeding, EGD   ESOPHAGOGASTRODUODENOSCOPY (EGD) WITH PROPOFOL  N/A 10/26/2019   Procedure: ESOPHAGOGASTRODUODENOSCOPY (EGD) WITH BIOPSY;  Surgeon: Jinny Carmine, MD;  Location: [facility];  Service: Endoscopy;  Laterality: N/A;  sleep apnea    LAPAROSCOPIC PARTIAL COLECTOMY Right 11/17/2019   Procedure: LAPAROSCOPIC PARTIAL COLECTOMY RIGHT EXTENDED;  Surgeon: Desiderio Schanz, MD;  Location: [facility] ORS;  Service: General;  Laterality: Right;   PORTACATH PLACEMENT N/A 12/28/2019   Procedure: INSERTION PORT-A-CATH Left subclavian;  Surgeon: Desiderio Schanz, MD;  Location: [facility] ORS;  Service: General;  Laterality: N/A;    FAMILY HISTORY: Family History  Problem Relation Age of Onset   Healthy Mother     ADVANCED DIRECTIVES (Y/N):  N  HEALTH MAINTENANCE: Social History   Tobacco Use   Smoking status: Never    Passive exposure: Never   Smokeless tobacco: Never  Vaping Use   Vaping status: Never Used  Substance Use Topics   Alcohol use: Not Currently   Drug use: Never     Colonoscopy:  PAP:  Bone density:  Lipid panel:  No Known Allergies  Current Outpatient Medications  Medication Sig Dispense Refill   apixaban (ELIQUIS) 5 MG TABS tablet Take 5 mg by mouth 2 (two) times daily.     atorvastatin  (LIPITOR) 80 MG tablet Take 80 mg by mouth daily.     bumetanide  (BUMEX ) 2 MG tablet Take 2 mg by mouth once a week. Taking one in the morning.     colchicine  0.6 MG tablet Take 1 tablet (0.6 mg total) by mouth daily as needed. Until flare resolves.     hydrocortisone  (ANUSOL -HC) 2.5 % rectal cream Place 1 Application rectally 2 (two) times daily. 30 g 0   lidocaine -prilocaine  (EMLA ) cream Apply 1 Application topically daily.     magnesium  chloride (SLOW-MAG) 64 MG TBEC SR tablet Take 1 tablet (64 mg total) by mouth daily. 60 tablet 1   ondansetron  (ZOFRAN ) 8 MG tablet Take 1 tablet (8 mg total) by mouth every 8 (eight) hours as needed for nausea or vomiting. 20 tablet 0   oxyCODONE -acetaminophen  (PERCOCET/ROXICET) 5-325 MG tablet Take 1-2 tablets by mouth every 6 (six) hours as needed for severe pain (pain score 7-10). 60 tablet 0   prochlorperazine  (COMPAZINE ) 10 MG tablet Take 1 tablet (10 mg total) by mouth every 6 (six) hours as  needed for nausea or vomiting. 30 tablet 0   spironolactone (ALDACTONE) 25 MG tablet Take 1 tablet by mouth every morning.     tamsulosin  (FLOMAX ) 0.4 MG CAPS capsule Take 1 capsule (0.4 mg total) by mouth daily after supper. 30 capsule 6   Vibegron  (GEMTESA ) 75 MG TABS Take 1 tablet (75 mg total) by mouth daily for 28 days.     allopurinol  (ZYLOPRIM ) 100 MG tablet Take 200 mg by mouth daily. (Patient not taking: Reported on 07/14/2024)     No current facility-administered medications for this visit.   Facility-Administered Medications Ordered in Other Visits  Medication Dose Route Frequency Provider Last Rate Last Admin   heparin  lock flush 100 unit/mL  500 Units Intravenous Once Phillippe Orlick J, MD        OBJECTIVE: Vitals:   07/21/24 0851  BP: (!) 99/46  Pulse: (!) 52  Resp: 20  Temp: (!) 96.8 F (36 C)  SpO2: 100%       Body mass index is 26.88 kg/m.    ECOG FS:1 - Symptomatic but completely ambulatory  General: Well-developed, well-nourished, no acute distress. Eyes: Pink  conjunctiva, anicteric sclera. HEENT: Normocephalic, moist mucous membranes. Lungs: No audible wheezing or coughing. Heart: Regular rate and rhythm. Abdomen: Soft, nontender, no obvious distention. Musculoskeletal: No edema, cyanosis, or clubbing. Neuro: Alert, answering all questions appropriately. Cranial nerves grossly intact. Skin: No rashes or petechiae noted. Psych: Normal affect.  LAB RESULTS:  Lab Results  Component Value Date   NA 137 07/14/2024   K 4.2 07/14/2024   CL 108 07/14/2024   CO2 19 (L) 07/14/2024   GLUCOSE 158 (H) 07/14/2024   BUN 78 (H) 07/14/2024   CREATININE 3.21 (H) 07/14/2024   CALCIUM  9.1 07/14/2024   PROT 8.1 07/14/2024   ALBUMIN 3.9 07/14/2024   AST 25 07/14/2024   ALT 13 07/14/2024   ALKPHOS 223 (H) 07/14/2024   BILITOT 1.2 07/14/2024   GFRNONAA 20 (L) 07/14/2024   GFRAA >60 07/29/2020    Lab Results  Component Value Date   WBC 1.6 (L) 07/14/2024    NEUTROABS 0.5 (L) 07/14/2024   HGB 7.1 (L) 07/14/2024   HCT 22.4 (L) 07/14/2024   MCV 91.4 07/14/2024   PLT 104 (L) 07/14/2024   Lab Results  Component Value Date   IRON 79 08/13/2023   TIBC 234 (L) 08/13/2023   IRONPCTSAT 34 08/13/2023   Lab Results  Component Value Date   FERRITIN 686 (H) 09/03/2023     STUDIES: No results found.   ONCOLOGY HISTORY: Patient completed cycle 9 of adjuvant FOLFOX on June 03, 2020.  Given his difficulties with treatment and declining performance status, treatment was discontinued altogether. Colonoscopy on September 03, 2022 did not reveal any significant pathology and recommendation was to repeat in 3 years.    ASSESSMENT: Recurrent colon cancer, smoldering myeloma, prostate cancer.  PLAN:    Recurrent colon cancer: PET scan results from December 30, 2023 reviewed independently with progressive disease of hypermetabolic retrocaval mass, but no other evidence of metastatic disease.  Patient has completed SBRT to this lesion.  Repeat PET scan on April 14, 2024 reviewed independently with continued progression of disease now with evidence of liver metastasis.  Since reinitiating treatment, patient CEA has trended down to 245, but his most recent result trended up slightly to 269.  Patient's neutropenia has improved, therefore we will proceed with cycle 14 of dose reduced FOLFIRI today.  Return to clinic in 2 days for pump removal, GCF support, and blood transfusion.  Patient will then return to clinic in 1 week for laboratory work and consideration of additional blood, and then in 3 weeks for further evaluation and consideration of cycle 15.   Prostate cancer: Gleason's 3+4.  Patient's PSA increased and peaked at 39.34, but since completing treatment it continues to trend down.  His most recent result is 1.61.  Nuclear medicine bone scan on December 13, 2022 was reported as negative.  PSMA PET per radiation oncology from Mar 21, 2023 did not reveal metastatic  disease.  Patient completed XRT for local control disease on May 30, 2023.  Can consider Eligard in the future.  Continue follow-up with urology as indicated.   Smoldering myeloma: Chronic and unchanged.  Patient was initially diagnosed and treated in [place], [place]  and treated with single agent Velcade in approximately 2013.  He declined maintenance treatment or referral for bone marrow transplant.  His M spike has ranged from 1.1-2.0 since July 2020.  His most recent result from April 28, 2024 is 1.8.  Over the same timeframe his IgG component has ranged from [phone].  His most  recent result is 2635.  Finally, his kappa free light chains have ranged from 24.8 -75.3.  His most recent result was elevated at 98.5, but his lambda free light chains are also elevated.  His kappa/lambda free light chain ratio was within normal limits.  Nuclear med bone scan as above.  His most recent bone marrow biopsy completed on May 28, 2019 revealed only 10 to 15% plasma cells with no clonality reported.  Cytogenetics were also reported as normal.    Anemia: Hemoglobin only mildly improved to 7.3 despite recent transfusion.  Return to clinic in 2 days to receive 1 unit of blood with polyp removal. Neutropenia: Improved.  Continue G-CSF support with pump off for future treatments.  Continue with dose reduced FOLFIRI as above.  Thrombocytopenia: Platelets 91 today.  Proceed cautiously with treatment as above.   Renal insufficiency: Chronic and unchanged.  Patient's GFR is 21.  Neither irinotecan  or 5-FU need to be dosed reduced in renal insufficiency.  Continue follow-up with nephrology as scheduled. Peripheral neuropathy: Chronic and unchanged.  Patient reports gabapentin  did not help.  Continue Lyrica  50 mg daily and was previously given a referral to neurology.   Hypotension: Chronic and unchanged.  Patient is asymptomatic.  Monitor. Coping/anxiety: Patient was previously given a referral to [facility].  CHF  exacerbation/edema: Resolved.  Patient was recently at [facility] for nearly a month.  Continue follow-up with cardiology and nephrology.  Monitor closely with fluid infusion today and blood transfusion tomorrow. Diarrhea: Resolved.  Continue Imodium as needed. Hemorrhoids: Patient does not complain of this today.  Continue OTC remedies as needed. Urinary frequency: Patient was previously given a referral back to urology.  Patient expressed understanding and was in agreement with this plan. He also understands that He can call clinic at any time with any questions, concerns, or complaints.    Evalene JINNY Reusing, MD   07/21/2024 9:00 AM

## 2024-07-21 NOTE — Patient Instructions (Signed)
 [facility] - A DEPT OF [facility]  Discharge Instructions: Thank you for choosing [facility] to provide your oncology and hematology care.  If you have a lab appointment with the Cancer Center, please go directly to the Cancer Center and check in at the registration area.  Wear comfortable clothing and clothing appropriate for easy access to any Portacath or PICC line.   We strive to give you quality time with your provider. You may need to reschedule your appointment if you arrive late (15 or more minutes).  Arriving late affects you and other patients whose appointments are after yours.  Also, if you miss three or more appointments without notifying the office, you may be dismissed from the clinic at the provider's discretion.      For prescription refill requests, have your pharmacy contact our office and allow 72 hours for refills to be completed.    Today you received the following chemotherapy and/or immunotherapy agents- irinotecan, leucovorin, 5FU      To help prevent nausea and vomiting after your treatment, we encourage you to take your nausea medication as directed.  BELOW ARE SYMPTOMS THAT SHOULD BE REPORTED IMMEDIATELY: *FEVER GREATER THAN 100.4 F (38 C) OR HIGHER *CHILLS OR SWEATING *NAUSEA AND VOMITING THAT IS NOT CONTROLLED WITH YOUR NAUSEA MEDICATION *UNUSUAL SHORTNESS OF BREATH *UNUSUAL BRUISING OR BLEEDING *URINARY PROBLEMS (pain or burning when urinating, or frequent urination) *BOWEL PROBLEMS (unusual diarrhea, constipation, pain near the anus) TENDERNESS IN MOUTH AND THROAT WITH OR WITHOUT PRESENCE OF ULCERS (sore throat, sores in mouth, or a toothache) UNUSUAL RASH, SWELLING OR PAIN  UNUSUAL VAGINAL DISCHARGE OR ITCHING   Items with * indicate a potential emergency and should be followed up as soon as possible or go to the Emergency Department if any problems should occur.  Please show the CHEMOTHERAPY ALERT CARD  or IMMUNOTHERAPY ALERT CARD at check-in to the Emergency Department and triage nurse.  Should you have questions after your visit or need to cancel or reschedule your appointment, please contact [facility] - A DEPT OF [facility]  [phone] and follow the prompts.  Office hours are 8:00 a.m. to 4:30 p.m. Monday - Friday. Please note that voicemails left after 4:00 p.m. may not be returned until the following business day.  We are closed weekends and major holidays. You have access to a nurse at all times for urgent questions. Please call the main number to the clinic [phone] and follow the prompts.  For any non-urgent questions, you may also contact your provider using MyChart. We now offer e-Visits for anyone 64 and older to request care online for non-urgent symptoms. For details visit [email or web address].   Also download the MyChart app! Go to the app store, search "MyChart", open the app, select [facility], and log in with your MyChart username and password.

## 2024-07-22 LAB — CEA: CEA: 272 ng/mL — ABNORMAL HIGH (ref 0.0–4.7)

## 2024-07-23 ENCOUNTER — Inpatient Hospital Stay

## 2024-07-23 ENCOUNTER — Encounter

## 2024-07-23 VITALS — BP 83/59 | HR 47 | Temp 97.3°F | Resp 16

## 2024-07-23 DIAGNOSIS — C184 Malignant neoplasm of transverse colon: Secondary | ICD-10-CM

## 2024-07-23 DIAGNOSIS — Z5111 Encounter for antineoplastic chemotherapy: Secondary | ICD-10-CM | POA: Diagnosis not present

## 2024-07-23 MED ORDER — ACETAMINOPHEN 325 MG PO TABS
650.0000 mg | ORAL_TABLET | Freq: Once | ORAL | Status: AC
  Administered 2024-07-23: 650 mg via ORAL
  Filled 2024-07-23: qty 2

## 2024-07-23 MED ORDER — PEGFILGRASTIM-JMDB 6 MG/0.6ML ~~LOC~~ SOSY
6.0000 mg | PREFILLED_SYRINGE | Freq: Once | SUBCUTANEOUS | Status: AC
  Administered 2024-07-23: 6 mg via SUBCUTANEOUS
  Filled 2024-07-23: qty 0.6

## 2024-07-23 MED ORDER — DIPHENHYDRAMINE HCL 50 MG/ML IJ SOLN
25.0000 mg | Freq: Once | INTRAMUSCULAR | Status: DC
  Filled 2024-07-23: qty 1

## 2024-07-24 LAB — BPAM RBC
Blood Product Expiration Date: 202509262359
ISSUE DATE / TIME: 202509181329
Unit Type and Rh: 202509262359
Unit Type and Rh: 5100

## 2024-07-24 LAB — TYPE AND SCREEN
ABO/RH(D): O POS
Antibody Screen: NEGATIVE
Unit division: 0

## 2024-07-27 ENCOUNTER — Other Ambulatory Visit: Payer: Self-pay | Admitting: *Deleted

## 2024-07-27 DIAGNOSIS — D649 Anemia, unspecified: Secondary | ICD-10-CM

## 2024-07-28 ENCOUNTER — Encounter: Payer: Self-pay | Admitting: Hospice and Palliative Medicine

## 2024-07-28 ENCOUNTER — Other Ambulatory Visit: Payer: Self-pay

## 2024-07-28 ENCOUNTER — Ambulatory Visit

## 2024-07-28 ENCOUNTER — Other Ambulatory Visit: Payer: Self-pay | Admitting: *Deleted

## 2024-07-28 ENCOUNTER — Telehealth: Payer: Self-pay

## 2024-07-28 ENCOUNTER — Other Ambulatory Visit

## 2024-07-28 ENCOUNTER — Ambulatory Visit: Admitting: Oncology

## 2024-07-28 ENCOUNTER — Inpatient Hospital Stay

## 2024-07-28 ENCOUNTER — Inpatient Hospital Stay (HOSPITAL_BASED_OUTPATIENT_CLINIC_OR_DEPARTMENT_OTHER): Admitting: Hospice and Palliative Medicine

## 2024-07-28 VITALS — BP 128/86 | HR 88 | Temp 97.8°F | Resp 18

## 2024-07-28 DIAGNOSIS — D509 Iron deficiency anemia, unspecified: Secondary | ICD-10-CM

## 2024-07-28 DIAGNOSIS — D649 Anemia, unspecified: Secondary | ICD-10-CM

## 2024-07-28 DIAGNOSIS — C184 Malignant neoplasm of transverse colon: Secondary | ICD-10-CM

## 2024-07-28 DIAGNOSIS — Z5111 Encounter for antineoplastic chemotherapy: Secondary | ICD-10-CM | POA: Diagnosis not present

## 2024-07-28 LAB — CBC WITH DIFFERENTIAL/PLATELET
Abs Immature Granulocytes: 0.07 K/uL (ref 0.00–0.07)
Basophils Absolute: 0 K/uL (ref 0.0–0.1)
Basophils Relative: 1 %
Eosinophils Absolute: 0 K/uL (ref 0.0–0.5)
Eosinophils Relative: 1 %
HCT: 22.3 % — ABNORMAL LOW (ref 39.0–52.0)
Hemoglobin: 7.2 g/dL — ABNORMAL LOW (ref 13.0–17.0)
Immature Granulocytes: 2 %
Lymphocytes Relative: 5 %
Lymphs Abs: 0.2 K/uL — ABNORMAL LOW (ref 0.7–4.0)
MCH: 28.8 pg (ref 26.0–34.0)
MCHC: 32.3 g/dL (ref 30.0–36.0)
MCV: 89.2 fL (ref 80.0–100.0)
Monocytes Absolute: 0.3 K/uL (ref 0.1–1.0)
Monocytes Relative: 6 %
Neutro Abs: 3.5 K/uL (ref 1.7–7.7)
Neutrophils Relative %: 85 %
Platelets: 41 K/uL — ABNORMAL LOW (ref 150–400)
RBC: 2.5 MIL/uL — ABNORMAL LOW (ref 4.22–5.81)
RDW: 19.6 % — ABNORMAL HIGH (ref 11.5–15.5)
WBC: 4.1 K/uL (ref 4.0–10.5)
nRBC: 0 % (ref 0.0–0.2)

## 2024-07-28 LAB — CMP (CANCER CENTER ONLY)
ALT: 15 U/L (ref 0–44)
AST: 19 U/L (ref 15–41)
Albumin: 3.8 g/dL (ref 3.5–5.0)
Alkaline Phosphatase: 211 U/L — ABNORMAL HIGH (ref 38–126)
Anion gap: 9 (ref 5–15)
BUN: 88 mg/dL — ABNORMAL HIGH (ref 8–23)
CO2: 17 mmol/L — ABNORMAL LOW (ref 22–32)
Calcium: 8.6 mg/dL — ABNORMAL LOW (ref 8.9–10.3)
Chloride: 104 mmol/L (ref 98–111)
Creatinine: 2.35 mg/dL — ABNORMAL HIGH (ref 0.61–1.24)
GFR, Estimated: 29 mL/min — ABNORMAL LOW (ref >60)
Glucose, Bld: 98 mg/dL (ref 70–99)
Potassium: 4.5 mmol/L (ref 3.5–5.1)
Sodium: 130 mmol/L — ABNORMAL LOW (ref 135–145)
Total Bilirubin: 1.7 mg/dL — ABNORMAL HIGH (ref 0.0–1.2)
Total Protein: 7.3 g/dL (ref 6.5–8.1)

## 2024-07-28 LAB — MAGNESIUM: Magnesium: 2.1 mg/dL (ref 1.7–2.4)

## 2024-07-28 LAB — PREPARE RBC (CROSSMATCH)

## 2024-07-28 MED ORDER — SODIUM CHLORIDE 0.9 % IV SOLN
Freq: Once | INTRAVENOUS | Status: AC
  Filled 2024-07-28: qty 250

## 2024-07-28 NOTE — Telephone Encounter (Signed)
 Spoke with patient, he didn't realize he had an appt today. Please change is lab appt to 3 pm. Patient states he will come to have lab drawn.

## 2024-07-28 NOTE — Progress Notes (Signed)
 Symptom Management Clinic [facility] at [facility] Telephone:([phone]) [phone] Fax:([phone]) [phone]  Patient Care Team: Jeffie Cheryl BRAVO, MD as PCP - General (Family Medicine) [name], MD as Consulting Physician (Oncology) Lane Arthea BRAVO, MD as Referring Physician (Neurology) Lateef, Munsoor, MD as Consulting Physician (Nephrology) Jama Margery ORN, MD as Referring Physician (Cardiology) Lenn Aran, MD as Consulting Physician (Radiation Oncology)   NAME OF PATIENT: Chris George  [id number]  08-31-1956   DATE OF VISIT: 07/28/24  REASON FOR CONSULT: Chris George is a 68 y.o. male with multiple medical problems including recurrent stage IIIc colon cancer, smoldering myeloma, and prostate cancer.  History of stroke with residual expressive aphasia.  INTERVAL HISTORY: Patient was in clinic on 07/21/2024, seen by Dr. Jacobo and received cycle 14 FOLFIRI.  Patient presented to the cancer center today for routine labs and with an add-on [facility] due to complaint of diarrhea and poor oral intake.  Patient reports frequent watery, nonbloody diarrhea over the past week.  Insists that he is having 10-15 loose stools daily.  Denies abdominal pain, nausea, vomiting, fever, or chills.  Has not tried any antidiarrheals.  Denies recent fevers or illnesses. Denies any easy bleeding or bruising. Reports weight loss. Denies chest pain. Denies any nausea, vomiting. Denies urinary complaints. Patient offers no further specific complaints today.   PAST MEDICAL HISTORY: Past Medical History:  Diagnosis Date   A-fib (HCC)    Anemia    Arthritis    ankles   Cancer (HCC)    multiple myeloma   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HCC) 10/26/2019   Cardiomyopathy (HCC)    Chemotherapy-induced peripheral neuropathy    CHF (congestive heart failure) (HCC)    Depression    Dysrhythmia    atrial fib   History of CVA (cerebrovascular accident)    HLD (hyperlipidemia)     Hypercholesteremia    Hypertension    Moderate tricuspid insufficiency    Multiple myeloma (HCC)    not being treated right now per pt 11/11/19   Pneumonia    Sleep apnea    No CPAP.  OSA resolved with wt loss.    PAST SURGICAL HISTORY:  Past Surgical History:  Procedure Laterality Date   ATRIAL ABLATION SURGERY     CARDIAC SURGERY     A-Fib Ablations   COLONOSCOPY N/A 03/17/2024   Procedure: COLONOSCOPY;  Surgeon: Jinny Carmine, MD;  Location: [facility] ENDOSCOPY;  Service: Endoscopy;  Laterality: N/A;   COLONOSCOPY WITH PROPOFOL  N/A 10/26/2019   Procedure: COLONOSCOPY WITH BIOPSY;  Surgeon: Jinny Carmine, MD;  Location: [facility];  Service: Endoscopy;  Laterality: N/A;   COLONOSCOPY WITH PROPOFOL  N/A 09/03/2022   Procedure: COLONOSCOPY WITH PROPOFOL ;  Surgeon: Jinny Carmine, MD;  Location: [facility];  Service: Endoscopy;  Laterality: N/A;   ESOPHAGOGASTRODUODENOSCOPY N/A 03/17/2024   Procedure: GI Bleeding;  Surgeon: Jinny Carmine, MD;  Location: [facility] ENDOSCOPY;  Service: Endoscopy;  Laterality: N/A;  GI Bleeding, EGD   ESOPHAGOGASTRODUODENOSCOPY (EGD) WITH PROPOFOL  N/A 10/26/2019   Procedure: ESOPHAGOGASTRODUODENOSCOPY (EGD) WITH BIOPSY;  Surgeon: Jinny Carmine, MD;  Location: [facility];  Service: Endoscopy;  Laterality: N/A;  sleep apnea   LAPAROSCOPIC PARTIAL COLECTOMY Right 11/17/2019   Procedure: LAPAROSCOPIC PARTIAL COLECTOMY RIGHT EXTENDED;  Surgeon: Desiderio Schanz, MD;  Location: [facility] ORS;  Service: General;  Laterality: Right;   PORTACATH PLACEMENT N/A 12/28/2019   Procedure: INSERTION PORT-A-CATH Left subclavian;  Surgeon: Desiderio Schanz, MD;  Location: [facility] ORS;  Service: General;  Laterality: N/A;    HEMATOLOGY/ONCOLOGY HISTORY:  Oncology History  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HCC)  10/27/2019 Initial Diagnosis   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HCC)   12/01/2019 Cancer Staging   Staging form: Colon and Rectum, AJCC 8th Edition - Clinical stage from  12/01/2019: Stage IIIC (cT3, cN2b, cM0) - Signed by [name], MD on 12/01/2019   12/30/2019 - 06/03/2020 Chemotherapy   Patient is on Treatment Plan : COLORECTAL FOLFOX q14d x 6 months     05/15/2023 - 04/30/2024 Chemotherapy   Patient is on Treatment Plan : COLORECTAL FOLFIRI q14d     05/12/2024 -  Chemotherapy   Patient is on Treatment Plan : COLORECTAL FOLFIRI q14d       ALLERGIES:  has no known allergies.  MEDICATIONS:  Current Outpatient Medications  Medication Sig Dispense Refill   apixaban  (ELIQUIS ) 5 MG TABS tablet Take 5 mg by mouth 2 (two) times daily.     atorvastatin  (LIPITOR) 80 MG tablet Take 80 mg by mouth daily.     bumetanide  (BUMEX ) 2 MG tablet Take 2 mg by mouth once a week. Taking one in the morning.     colchicine  0.6 MG tablet Take 1 tablet (0.6 mg total) by mouth daily as needed. Until flare resolves.     hydrocortisone  (ANUSOL -HC) 2.5 % rectal cream Place 1 Application rectally 2 (two) times daily. 30 g 0   lidocaine -prilocaine  (EMLA ) cream Apply 1 Application topically daily.     magnesium  chloride (SLOW-MAG) 64 MG TBEC SR tablet Take 1 tablet (64 mg total) by mouth daily. 60 tablet 1   ondansetron  (ZOFRAN ) 8 MG tablet Take 1 tablet (8 mg total) by mouth every 8 (eight) hours as needed for nausea or vomiting. 20 tablet 0   oxyCODONE -acetaminophen  (PERCOCET/ROXICET) 5-325 MG tablet Take 1-2 tablets by mouth every 6 (six) hours as needed for severe pain (pain score 7-10). 60 tablet 0   prochlorperazine  (COMPAZINE ) 10 MG tablet Take 1 tablet (10 mg total) by mouth every 6 (six) hours as needed for nausea or vomiting. 30 tablet 0   spironolactone (ALDACTONE) 25 MG tablet Take 1 tablet by mouth every morning.     tamsulosin  (FLOMAX ) 0.4 MG CAPS capsule Take 1 capsule (0.4 mg total) by mouth daily after supper. 30 capsule 6   Vibegron  (GEMTESA ) 75 MG TABS Take 1 tablet (75 mg total) by mouth daily for 28 days.     allopurinol  (ZYLOPRIM ) 100 MG tablet Take 200 mg  by mouth daily. (Patient not taking: Reported on 07/28/2024)     No current facility-administered medications for this visit.   Facility-Administered Medications Ordered in Other Visits  Medication Dose Route Frequency Provider Last Rate Last Admin   heparin  lock flush 100 unit/mL  500 Units Intravenous Once Finnegan, Timothy J, MD        VITAL SIGNS: BP 128/86   Pulse 88   Temp 97.8 F (36.6 C)   Resp 18   SpO2 96%  There were no vitals filed for this visit.   Estimated body mass index is 26.88 kg/m as calculated from the following:   Height as of 07/17/24: 5' 9 (1.753 m).   Weight as of 07/21/24: 182 lb (82.6 kg).  LABS: CBC:    Component Value Date/Time   WBC 4.1 07/28/2024 1448   HGB 7.2 (L) 07/28/2024 1448   HGB 7.3 (L) 07/21/2024 0834   HGB 14.3 05/13/2013 1116   HCT 22.3 (L) 07/28/2024 1448   HCT 42.9  05/13/2013 1116   PLT 41 (L) 07/28/2024 1448   PLT 91 (L) 07/21/2024 0834   PLT 169 05/13/2013 1116   MCV 89.2 07/28/2024 1448   MCV 84 05/13/2013 1116   NEUTROABS PENDING 07/28/2024 1448   NEUTROABS 2.3 05/13/2013 1116   LYMPHSABS PENDING 07/28/2024 1448   LYMPHSABS 0.9 (L) 05/13/2013 1116   MONOABS PENDING 07/28/2024 1448   MONOABS 0.4 05/13/2013 1116   EOSABS PENDING 07/28/2024 1448   EOSABS 0.1 05/13/2013 1116   BASOSABS PENDING 07/28/2024 1448   BASOSABS 0.0 05/13/2013 1116   Comprehensive Metabolic Panel:    Component Value Date/Time   NA 130 (L) 07/28/2024 1448   NA 143 05/13/2013 1116   K 4.5 07/28/2024 1448   K 3.5 05/13/2013 1116   CL 104 07/28/2024 1448   CL 102 05/13/2013 1116   CO2 17 (L) 07/28/2024 1448   CO2 32 05/13/2013 1116   BUN 88 (H) 07/28/2024 1448   BUN 14 05/13/2013 1116   CREATININE 2.35 (H) 07/28/2024 1448   CREATININE 1.53 (H) 05/13/2013 1116   GLUCOSE 98 07/28/2024 1448   GLUCOSE 122 (H) 05/13/2013 1116   CALCIUM  8.6 (L) 07/28/2024 1448   CALCIUM  8.6 05/13/2013 1116   AST 19 07/28/2024 1448   ALT 15 07/28/2024 1448    ALT 37 07/24/2012 1915   ALKPHOS 211 (H) 07/28/2024 1448   ALKPHOS 129 07/24/2012 1915   BILITOT 1.7 (H) 07/28/2024 1448   PROT 7.3 07/28/2024 1448   PROT 8.4 (H) 07/24/2012 1915   ALBUMIN 3.8 07/28/2024 1448   ALBUMIN 4.0 07/24/2012 1915    RADIOGRAPHIC STUDIES: No results found.   PERFORMANCE STATUS (ECOG) : 2 - Symptomatic, <50% confined to bed  Review of Systems Unless otherwise noted, a complete review of systems is negative.  Physical Exam General: NAD Cardiovascular: regular rate and rhythm Pulmonary: clear ant fields Abdomen: soft, nontender, + bowel sounds GU: no suprapubic tenderness Extremities: no edema, no joint deformities Skin: no rashes Neurological: Weakness, chronic expressive aphasia  IMPRESSION/PLAN: Colorectal cancer -on treatment FOLFIRI.    Prostate cancer -followed by urology  Myeloma -on surveillance  Diarrhea -likely secondary to chemotherapy.  However, will check stool studies to rule out infectious etiologies.  If stool studies negative, would recommend antidiarrheals including Imodium and/or Lomotil.  Anemia/thrombocytopenia -due to chemotherapy.  Will proceed with transfusion 1 unit PRBC tomorrow.  Dehydration -mild hyponatremia which is likely hypovolemic.  Proceed with IV fluids.  Labs/SMC/Fluids next week.  Case and plan discussed with Dr. Jacobo  Patient expressed understanding and was in agreement with this plan. He also understands that He can call clinic at any time with any questions, concerns, or complaints.   Thank you for allowing me to participate in the care of this very pleasant patient.   Time Total: 25 minutes  Visit consisted of counseling and education dealing with the complex and emotionally intense issues of symptom management in the setting of serious illness.Greater than 50%  of this time was spent counseling and coordinating care related to the above assessment and plan.  Signed by: Fonda Mower, PhD,  NP-C

## 2024-07-28 NOTE — Telephone Encounter (Signed)
 Attempt X2 to cell phone and work number. Left message on work vm to give us  a call back.

## 2024-07-28 NOTE — Telephone Encounter (Signed)
 Secure chat sent from infusion:  This patient is scheduled for possible blood transfusion, but I noticed he did not show for 9:30 am labs today  Tried calling patient, went to VM, not able to leave message due to VM being full. Will continue to try.

## 2024-07-29 ENCOUNTER — Observation Stay (HOSPITAL_COMMUNITY)
Admission: EM | Admit: 2024-07-29 | Discharge: 2024-07-30 | Disposition: A | Discharge status: Home / Self Care | Source: Ambulatory Visit | Attending: Emergency Medicine | Admitting: Emergency Medicine

## 2024-07-29 ENCOUNTER — Emergency Department

## 2024-07-29 ENCOUNTER — Inpatient Hospital Stay

## 2024-07-29 ENCOUNTER — Other Ambulatory Visit: Payer: Self-pay

## 2024-07-29 ENCOUNTER — Encounter: Payer: Self-pay | Admitting: Intensive Care

## 2024-07-29 DIAGNOSIS — Z8673 Personal history of transient ischemic attack (TIA), and cerebral infarction without residual deficits: Secondary | ICD-10-CM | POA: Insufficient documentation

## 2024-07-29 DIAGNOSIS — Z85038 Personal history of other malignant neoplasm of large intestine: Secondary | ICD-10-CM | POA: Insufficient documentation

## 2024-07-29 DIAGNOSIS — A09 Infectious gastroenteritis and colitis, unspecified: Secondary | ICD-10-CM | POA: Diagnosis not present

## 2024-07-29 DIAGNOSIS — D696 Thrombocytopenia, unspecified: Secondary | ICD-10-CM | POA: Insufficient documentation

## 2024-07-29 DIAGNOSIS — E872 Acidosis, unspecified: Secondary | ICD-10-CM | POA: Insufficient documentation

## 2024-07-29 DIAGNOSIS — Z8546 Personal history of malignant neoplasm of prostate: Secondary | ICD-10-CM | POA: Insufficient documentation

## 2024-07-29 DIAGNOSIS — Z7901 Long term (current) use of anticoagulants: Secondary | ICD-10-CM | POA: Insufficient documentation

## 2024-07-29 DIAGNOSIS — I959 Hypotension, unspecified: Secondary | ICD-10-CM | POA: Insufficient documentation

## 2024-07-29 DIAGNOSIS — I5020 Unspecified systolic (congestive) heart failure: Secondary | ICD-10-CM | POA: Insufficient documentation

## 2024-07-29 DIAGNOSIS — D631 Anemia in chronic kidney disease: Secondary | ICD-10-CM | POA: Insufficient documentation

## 2024-07-29 DIAGNOSIS — K31811 Angiodysplasia of stomach and duodenum with bleeding: Secondary | ICD-10-CM | POA: Insufficient documentation

## 2024-07-29 DIAGNOSIS — G629 Polyneuropathy, unspecified: Secondary | ICD-10-CM | POA: Diagnosis not present

## 2024-07-29 DIAGNOSIS — I429 Cardiomyopathy, unspecified: Secondary | ICD-10-CM | POA: Insufficient documentation

## 2024-07-29 DIAGNOSIS — I4821 Permanent atrial fibrillation: Secondary | ICD-10-CM | POA: Insufficient documentation

## 2024-07-29 DIAGNOSIS — Z79899 Other long term (current) drug therapy: Secondary | ICD-10-CM | POA: Insufficient documentation

## 2024-07-29 DIAGNOSIS — D61818 Other pancytopenia: Secondary | ICD-10-CM | POA: Diagnosis present

## 2024-07-29 DIAGNOSIS — C184 Malignant neoplasm of transverse colon: Secondary | ICD-10-CM

## 2024-07-29 DIAGNOSIS — N184 Chronic kidney disease, stage 4 (severe): Secondary | ICD-10-CM | POA: Insufficient documentation

## 2024-07-29 DIAGNOSIS — Z8579 Personal history of other malignant neoplasms of lymphoid, hematopoietic and related tissues: Secondary | ICD-10-CM | POA: Insufficient documentation

## 2024-07-29 DIAGNOSIS — Z5111 Encounter for antineoplastic chemotherapy: Secondary | ICD-10-CM | POA: Diagnosis not present

## 2024-07-29 DIAGNOSIS — Z923 Personal history of irradiation: Secondary | ICD-10-CM | POA: Insufficient documentation

## 2024-07-29 DIAGNOSIS — A0472 Enterocolitis due to Clostridium difficile, not specified as recurrent: Secondary | ICD-10-CM | POA: Insufficient documentation

## 2024-07-29 DIAGNOSIS — C9000 Multiple myeloma not having achieved remission: Secondary | ICD-10-CM | POA: Diagnosis present

## 2024-07-29 DIAGNOSIS — K7460 Unspecified cirrhosis of liver: Secondary | ICD-10-CM | POA: Diagnosis present

## 2024-07-29 DIAGNOSIS — K5521 Angiodysplasia of colon with hemorrhage: Secondary | ICD-10-CM | POA: Diagnosis present

## 2024-07-29 DIAGNOSIS — I130 Hypertensive heart and chronic kidney disease with heart failure and stage 1 through stage 4 chronic kidney disease, or unspecified chronic kidney disease: Secondary | ICD-10-CM | POA: Insufficient documentation

## 2024-07-29 DIAGNOSIS — I10 Essential (primary) hypertension: Secondary | ICD-10-CM | POA: Diagnosis present

## 2024-07-29 DIAGNOSIS — C61 Malignant neoplasm of prostate: Secondary | ICD-10-CM | POA: Diagnosis present

## 2024-07-29 LAB — C DIFFICILE QUICK SCREEN W PCR REFLEX
C Diff antigen: POSITIVE — AB
C Diff toxin: NEGATIVE

## 2024-07-29 LAB — CBC WITH DIFFERENTIAL/PLATELET
Abs Immature Granulocytes: 0.06 K/uL (ref 0.00–0.07)
Basophils Absolute: 0 K/uL (ref 0.0–0.1)
Basophils Relative: 1 %
Eosinophils Absolute: 0.1 K/uL (ref 0.0–0.5)
Eosinophils Relative: 2 %
HCT: 24.7 % — ABNORMAL LOW (ref 39.0–52.0)
Hemoglobin: 8 g/dL — ABNORMAL LOW (ref 13.0–17.0)
Immature Granulocytes: 1 %
Lymphocytes Relative: 5 %
Lymphs Abs: 0.3 K/uL — ABNORMAL LOW (ref 0.7–4.0)
MCH: 29.2 pg (ref 26.0–34.0)
MCHC: 32.4 g/dL (ref 30.0–36.0)
MCV: 90.1 fL (ref 80.0–100.0)
Monocytes Absolute: 0.5 K/uL (ref 0.1–1.0)
Monocytes Relative: 11 %
Neutro Abs: 4 K/uL (ref 1.7–7.7)
Neutrophils Relative %: 80 %
Platelets: 38 K/uL — ABNORMAL LOW (ref 150–400)
RBC: 2.74 MIL/uL — ABNORMAL LOW (ref 4.22–5.81)
RDW: 18.9 % — ABNORMAL HIGH (ref 11.5–15.5)
WBC: 5 K/uL (ref 4.0–10.5)
nRBC: 0 % (ref 0.0–0.2)

## 2024-07-29 LAB — CLOSTRIDIUM DIFFICILE BY PCR, REFLEXED
Hypervirulent Strain: NEGATIVE
Toxigenic C. Difficile by PCR: POSITIVE — AB

## 2024-07-29 LAB — COMPREHENSIVE METABOLIC PANEL WITH GFR
ALT: 17 U/L (ref 0–44)
AST: 23 U/L (ref 15–41)
Albumin: 3.8 g/dL (ref 3.5–5.0)
Alkaline Phosphatase: 228 U/L — ABNORMAL HIGH (ref 38–126)
Anion gap: 9 (ref 5–15)
BUN: 87 mg/dL — ABNORMAL HIGH (ref 8–23)
CO2: 18 mmol/L — ABNORMAL LOW (ref 22–32)
Calcium: 8.8 mg/dL — ABNORMAL LOW (ref 8.9–10.3)
Chloride: 104 mmol/L (ref 98–111)
Creatinine, Ser: 2.63 mg/dL — ABNORMAL HIGH (ref 0.61–1.24)
GFR, Estimated: 26 mL/min — ABNORMAL LOW (ref >60)
Glucose, Bld: 91 mg/dL (ref 70–99)
Potassium: 4.3 mmol/L (ref 3.5–5.1)
Sodium: 131 mmol/L — ABNORMAL LOW (ref 135–145)
Total Bilirubin: 1.7 mg/dL — ABNORMAL HIGH (ref 0.0–1.2)
Total Protein: 7.8 g/dL (ref 6.5–8.1)

## 2024-07-29 LAB — LIPASE, BLOOD: Lipase: 23 U/L (ref 11–51)

## 2024-07-29 LAB — GASTROINTESTINAL PANEL BY PCR, STOOL (REPLACES STOOL CULTURE)

## 2024-07-29 LAB — LACTIC ACID, PLASMA: Lactic Acid, Venous: 0.7 mmol/L (ref 0.5–1.9)

## 2024-07-29 MED ORDER — VANCOMYCIN HCL 125 MG PO CAPS
125.0000 mg | ORAL_CAPSULE | Freq: Four times a day (QID) | ORAL | Status: DC
  Administered 2024-07-30 (×2): 125 mg via ORAL
  Filled 2024-07-29 (×5): qty 1

## 2024-07-29 MED ORDER — VANCOMYCIN HCL 125 MG PO CAPS: 125.0000 mg | ORAL_CAPSULE | Freq: Four times a day (QID) | ORAL | Status: DC

## 2024-07-29 MED ORDER — SODIUM CHLORIDE 0.9% FLUSH: 3.0000 mL | INTRAVENOUS | Status: DC | PRN

## 2024-07-29 MED ORDER — ACETAMINOPHEN 325 MG PO TABS: 650.0000 mg | ORAL_TABLET | Freq: Four times a day (QID) | ORAL | Status: DC | PRN

## 2024-07-29 MED ORDER — TAMSULOSIN HCL 0.4 MG PO CAPS
0.4000 mg | ORAL_CAPSULE | Freq: Every day | ORAL | Status: DC
Start: 2024-07-30 — End: 2024-07-30

## 2024-07-29 MED ORDER — HYDROCODONE-ACETAMINOPHEN 5-325 MG PO TABS: 1.0000 | ORAL_TABLET | ORAL | Status: DC | PRN

## 2024-07-29 MED ORDER — HEPARIN SOD (PORK) LOCK FLUSH 100 UNIT/ML IV SOLN: 250.0000 [IU] | INTRAVENOUS | Status: DC | PRN

## 2024-07-29 MED ORDER — SODIUM CHLORIDE 0.9% FLUSH: 10.0000 mL | INTRAVENOUS | Status: DC | PRN

## 2024-07-29 MED ORDER — ONDANSETRON HCL 4 MG/2ML IJ SOLN: 4.0000 mg | Freq: Four times a day (QID) | INTRAMUSCULAR | Status: DC | PRN

## 2024-07-29 MED ORDER — SODIUM CHLORIDE 0.9 % IV SOLN: INTRAVENOUS | Status: AC

## 2024-07-29 MED ORDER — SODIUM CHLORIDE 0.9 % IV SOLN
Freq: Once | INTRAVENOUS | Status: AC
  Filled 2024-07-29: qty 250

## 2024-07-29 MED ORDER — SODIUM CHLORIDE 0.9 % IV BOLUS
1000.0000 mL | Freq: Once | INTRAVENOUS | Status: AC
  Administered 2024-07-29: 1000 mL via INTRAVENOUS

## 2024-07-29 MED ORDER — SODIUM CHLORIDE 0.9% IV SOLUTION
250.0000 mL | INTRAVENOUS | Status: DC
  Administered 2024-07-29: 250 mL via INTRAVENOUS
  Filled 2024-07-29: qty 250

## 2024-07-29 MED ORDER — OXYCODONE-ACETAMINOPHEN 5-325 MG PO TABS: 1.0000 | ORAL_TABLET | Freq: Four times a day (QID) | ORAL | Status: DC | PRN

## 2024-07-29 MED ORDER — ONDANSETRON HCL 4 MG PO TABS: 4.0000 mg | ORAL_TABLET | Freq: Four times a day (QID) | ORAL | Status: DC | PRN

## 2024-07-29 MED ORDER — MORPHINE SULFATE (PF) 2 MG/ML IV SOLN: 2.0000 mg | INTRAVENOUS | Status: DC | PRN

## 2024-07-29 MED ORDER — ACETAMINOPHEN 650 MG RE SUPP: 650.0000 mg | Freq: Four times a day (QID) | RECTAL | Status: DC | PRN

## 2024-07-29 MED ORDER — HEPARIN SOD (PORK) LOCK FLUSH 100 UNIT/ML IV SOLN: 500.0000 [IU] | Freq: Every day | INTRAVENOUS | Status: DC | PRN

## 2024-07-29 MED ORDER — MORPHINE SULFATE (PF) 2 MG/ML IV SOLN
2.0000 mg | Freq: Once | INTRAVENOUS | Status: AC
  Administered 2024-07-29: 2 mg via INTRAVENOUS
  Filled 2024-07-29: qty 1

## 2024-07-29 MED ORDER — AZITHROMYCIN 500 MG PO TABS: 500.0000 mg | ORAL_TABLET | Freq: Every day | ORAL | 0 refills | Status: DC

## 2024-07-29 MED ORDER — APIXABAN 5 MG PO TABS
5.0000 mg | ORAL_TABLET | Freq: Two times a day (BID) | ORAL | Status: DC
  Administered 2024-07-30 (×2): 5 mg via ORAL
  Filled 2024-07-29 (×2): qty 1

## 2024-07-29 MED ORDER — FIDAXOMICIN 200 MG PO TABS: 200.0000 mg | ORAL_TABLET | Freq: Two times a day (BID) | ORAL | 0 refills | Status: DC

## 2024-07-29 NOTE — Assessment & Plan Note (Signed)
 Essential hypertension Patient with soft blood pressures likely related to dehydration from ongoing diarrhea Hold all blood pressure lowering meds Cautious hydration in the setting of HFrEF and cardiomyopathy

## 2024-07-29 NOTE — Addendum Note (Signed)
 Addended by: SHERLEEN FONDA SAUNDERS on: 07/29/2024 01:53 PM   Modules accepted: Orders

## 2024-07-29 NOTE — Progress Notes (Signed)
 1230: Pt experiencing 9 out of 10 lower abdominal and groin pain, pt reports he has not taken any home pain medications today.  B/P 88/57 and HR 54 Per Dr. Jacobo give 2 mg Morphine  IV and monitor Pt for 30-60 minutes (since pt is driving) prior to discharge and discharge home. Pt agrees with plan.  1320: Josh at chairside, pt A&O x 4, no drowsiness noted. Pt states I feel a lot better, and actually more awake now. Pt reports feeling okay to leave and drive home. Josh NP agrees pt is okay for discharge.  Pt stable at discharge.

## 2024-07-29 NOTE — Assessment & Plan Note (Signed)
 Colon cancer s/p hemicolectomy and chemotherapy History of prostate cancer Thrombocytopenia and anemia, likely chemotherapy induced No acute issues suspected

## 2024-07-29 NOTE — ED Triage Notes (Addendum)
 Patient sent by PCP after positive stool sample for C-diff. Patient has hemorrhoids that cause him rectal pain  Reports he has liver cancer. Currently receives chemo every three weeks  History of Prostate cancer, colon cancer and multiple myeloma. Reports history of stroke

## 2024-07-29 NOTE — ED Provider Notes (Signed)
 [facility] Provider Note    Event Date/Time   First MD Initiated Contact with Patient 07/29/24 1639     (approximate)   History   Abnormal Lab   HPI  Chris George is a 68 y.o. male with a history of atrial fibrillation, multiple myeloma, CHF, hypertension, and hyperlipidemia who presents with concern for C. difficile infection.  The patient states that he has had diarrhea for approximate last month.  He has developed lower abdominal pain for the last several days.  He states that he received morphine  for this at the cancer center earlier today which has helped.  He denies any vomiting.  He has no fever or chills.  He states that he was prescribed an antibiotic after stool studies were positive for bacteria, but then was called today stating he had a second type of bacteria and needed to come to the hospital for evaluation.  I reviewed the past medical records.  The patient was seen by palliative medicine yesterday with diarrhea that was thought to be secondary to chemotherapy, however C. difficile PCR was positive.  GI panel was also positive for EAEC.   Physical Exam   Triage Vital Signs: ED Triage Vitals  Encounter Vitals Group     BP 07/29/24 1503 118/62     Girls Systolic BP Percentile --      Girls Diastolic BP Percentile --      Boys Systolic BP Percentile --      Boys Diastolic BP Percentile --      Pulse Rate 07/29/24 1503 (!) 50     Resp 07/29/24 1503 16     Temp 07/29/24 1503 97.8 F (36.6 C)     Temp Source 07/29/24 1503 Oral     SpO2 07/29/24 1503 99 %     Weight 07/29/24 1504 175 lb (79.4 kg)     Height 07/29/24 1504 5' 9 (1.753 m)     Head Circumference --      Peak Flow --      Pain Score 07/29/24 1503 3     Pain Loc --      Pain Education --      Exclude from Growth Chart --     Most recent vital signs: Vitals:   07/29/24 1911 07/29/24 2045  BP:  (!) 92/56  Pulse:  (!) 55  Resp:    Temp: 98 F (36.7 C)   SpO2:  100%      General: Alert, relatively well-appearing, no distress.  CV:  Good peripheral perfusion.  Resp:  Normal effort.  Abd:  With no focal tenderness.  No distention.  Other:  Moist mucous membranes.   ED Results / Procedures / Treatments   Labs (all labs ordered are listed, but only abnormal results are displayed) Labs Reviewed  CBC WITH DIFFERENTIAL/PLATELET - Abnormal; Notable for the following components:      Result Value   RBC 2.74 (*)    Hemoglobin 8.0 (*)    HCT 24.7 (*)    RDW 18.9 (*)    Platelets 38 (*)    Lymphs Abs 0.3 (*)    All other components within normal limits  COMPREHENSIVE METABOLIC PANEL WITH GFR - Abnormal; Notable for the following components:   Sodium 131 (*)    CO2 18 (*)    BUN 87 (*)    Creatinine, Ser 2.63 (*)    Calcium  8.8 (*)    Alkaline Phosphatase 228 (*)  Total Bilirubin 1.7 (*)    GFR, Estimated 26 (*)    All other components within normal limits  LACTIC ACID, PLASMA  LIPASE, BLOOD     EKG     RADIOLOGY  CT abdomen/pelvis: I independently viewed and interpreted the images; there are no dilated bowel loops or any free air or free fluid.  Radiology report indicates the following:  IMPRESSION:  1. Status post right hemicolectomy. Retrocrural and para-aortic nodal  metastases, mildly progressive.  2. Suspected cirrhosis. Suspected subtle hepatic metastases are not well  visualized. Mild ascites.  3. Prostatomegaly, suggesting BPH.  4. Possible cystitis, equivocal.    PROCEDURES:  Critical Care performed: No  Procedures   MEDICATIONS ORDERED IN ED: Medications  vancomycin  (VANCOCIN ) capsule 125 mg (has no administration in time range)  vancomycin  (VANCOCIN ) capsule 125 mg (has no administration in time range)  sodium chloride  0.9 % bolus 1,000 mL (0 mLs Intravenous Stopped 07/29/24 1911)     IMPRESSION / MDM / ASSESSMENT AND PLAN / ED COURSE  I reviewed the triage vital signs and the nursing notes.  68 year old  male with PMH as noted above presents for evaluation with diarrhea for the last month, abdominal pain over the last several days, and stool studies from yesterday showing EAEC as well as C. difficile.  On exam the patient is relatively well-appearing and his vital signs are normal.  Abdomen is nontender.  Differential diagnosis includes, but is not limited to, colitis, enteritis, dehydration, electrolyte abnormality, AKI.  We will obtain lab workup, CT abdomen/pelvis, give a fluid bolus, and reassess.  Patient's presentation is most consistent with acute presentation with potential threat to life or bodily function.  ----------------------------------------- 9:04 PM on 07/29/2024 -----------------------------------------  CT shows chronic findings but no concerning acute abnormality.  Lab workup is overall reassuring as well.  The patient is anemic but has no leukocytosis.  Alk phos and bilirubin are mildly elevated consistent with baseline.  Creatinine is also just slightly increased from baseline.  Lactate is normal.  However, given the patient's overall clinical status, the borderline low blood pressures, his relatively severe diarrhea and pain over the last several days, I think he will benefit from inpatient admission.  The patient is in agreement.  I consulted Dr. Cleatus from the hospitalist service; based on our discussion she agrees to evaluate the patient for admission.   FINAL CLINICAL IMPRESSION(S) / ED DIAGNOSES   Final diagnoses:  C. difficile diarrhea     Rx / DC Orders   ED Discharge Orders     None        Note:  This document was prepared using Dragon voice recognition software and may include unintentional dictation errors.    Jacolyn Pae, MD 07/29/24 2105

## 2024-07-29 NOTE — ED Notes (Signed)
 Noticed pts HR going as low as 32 on pulse oximeter. In room to assess pt and pt states this is normal. Pt placed on 5lead cardiac monitor as precautionary measure. Spoke with pt for approx 30 mins regarding medical status and health history. Pt denies any further needs. Call bell attached to bed rail and within reach.

## 2024-07-29 NOTE — Assessment & Plan Note (Signed)
 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

## 2024-07-29 NOTE — H&P (Signed)
 History and Physical    Patient: Chris George FMW:[id number] DOB: 01/15/1956 DOA: 07/29/2024 DOS: the patient was seen and examined on 07/29/2024 PCP: Jeffie Cheryl FORBES, MD  Patient coming from: Home  Chief Complaint:  Chief Complaint  Patient presents with   Abnormal Lab    HPI: Chris George is a 68 y.o. 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  He tested C. difficile antigen positive/toxin negative as well as enteroaggregative E. coli positive.  Patient has been having diarrhea for the past several weeks which has become more profuse over the course of the last few days and now has associated lower abdominal pain.  He went into see his PCP today who did stool studies which tested positive for E. coli and subsequently C. difficile.  He was treated with morphine  in the clinic for pain and sent to the ED.  Patient denies fever or vomiting. In the ED he was afebrile, BP soft at 92/56, pulse in the mid 50s. Labs notable for normal WBC, Stable hemoglobin of 8, stable thrombocytopenia of 38.  Creatinine at baseline at 2.63 but with bicarb of 18.  Stable elevation of alk phos to 228 and total bili to 1.7. CT abdomen and pelvis showing chronic findings related to kidney cirrhosis suspected hepatic metastasis prostatomegaly and possible focal cystitis. Patient was treated with an NS bolus and started on oral vancomycin  Admission requested.     Review of Systems: As mentioned in the history of present illness. All other systems reviewed and are negative.  Past Medical History:  Diagnosis Date   A-fib  (HCC)    Anemia    Arthritis    ankles   Cancer (HCC)    multiple myeloma   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HCC) 10/26/2019   Cardiomyopathy (HCC)    Chemotherapy-induced peripheral neuropathy    CHF (congestive heart failure) (HCC)    Depression    Dysrhythmia    atrial fib   History of CVA (cerebrovascular accident)    HLD (hyperlipidemia)    Hypercholesteremia    Hypertension    Moderate tricuspid insufficiency    Multiple myeloma (HCC)    not being treated right now per pt 11/11/19   Pneumonia    Sleep apnea    No CPAP.  OSA resolved with wt loss.   Past Surgical History:  Procedure Laterality Date   ATRIAL ABLATION SURGERY     CARDIAC SURGERY     A-Fib Ablations   COLONOSCOPY N/A 03/17/2024   Procedure: COLONOSCOPY;  Surgeon: Jinny Carmine, MD;  Location: [facility] ENDOSCOPY;  Service: Endoscopy;  Laterality: N/A;   COLONOSCOPY WITH PROPOFOL  N/A 10/26/2019   Procedure: COLONOSCOPY WITH BIOPSY;  Surgeon: Jinny Carmine, MD;  Location: [facility] SURGERY [facility];  Service: Endoscopy;  Laterality: N/A;   COLONOSCOPY WITH PROPOFOL  N/A 09/03/2022   Procedure: COLONOSCOPY WITH PROPOFOL ;  Surgeon: Jinny Carmine, MD;  Location: [facility] SURGERY [facility];  Service: Endoscopy;  Laterality: N/A;   ESOPHAGOGASTRODUODENOSCOPY N/A 03/17/2024   Procedure: GI Bleeding;  Surgeon: Jinny Carmine, MD;  Location: [facility] ENDOSCOPY;  Service: Endoscopy;  Laterality: N/A;  GI Bleeding, EGD  ESOPHAGOGASTRODUODENOSCOPY (EGD) WITH PROPOFOL  N/A 10/26/2019   Procedure: ESOPHAGOGASTRODUODENOSCOPY (EGD) WITH BIOPSY;  Surgeon: Jinny Carmine, MD;  Location: [facility];  Service: Endoscopy;  Laterality: N/A;  sleep apnea   LAPAROSCOPIC PARTIAL COLECTOMY Right 11/17/2019   Procedure: LAPAROSCOPIC PARTIAL COLECTOMY RIGHT EXTENDED;  Surgeon: Desiderio Schanz, MD;  Location: [facility] ORS;  Service: General;  Laterality: Right;   PORTACATH PLACEMENT N/A 12/28/2019   Procedure: INSERTION PORT-A-CATH Left subclavian;  Surgeon: Desiderio Schanz,  MD;  Location: [facility] ORS;  Service: General;  Laterality: N/A;   Social History:  reports that he has never smoked. He has never been exposed to tobacco smoke. He has never used smokeless tobacco. He reports that he does not currently use alcohol. He reports that he does not use drugs.  No Known Allergies  Family History  Problem Relation Age of Onset   Healthy Mother     Prior to Admission medications   Medication Sig Start Date End Date Taking? Authorizing Provider  allopurinol  (ZYLOPRIM ) 100 MG tablet Take 200 mg by mouth daily. Patient not taking: Reported on 07/28/2024 11/01/22   [provider]  apixaban  (ELIQUIS ) 5 MG TABS tablet Take 5 mg by mouth 2 (two) times daily.    [provider]  atorvastatin  (LIPITOR) 80 MG tablet Take 80 mg by mouth daily.    [provider]  azithromycin  (ZITHROMAX ) 500 MG tablet Take 1 tablet (500 mg total) by mouth daily. 07/29/24   Borders, Fonda SAUNDERS, NP  bumetanide  (BUMEX ) 2 MG tablet Take 2 mg by mouth once a week. Taking one in the morning. 12/17/23 12/11/24  [provider]  colchicine  0.6 MG tablet Take 1 tablet (0.6 mg total) by mouth daily as needed. Until flare resolves. 03/17/24   Jens Durand, MD  fidaxomicin  (DIFICID ) 200 MG TABS tablet Take 1 tablet (200 mg total) by mouth 2 (two) times daily. 07/29/24   Borders, Joshua R, NP  hydrocortisone  (ANUSOL -HC) 2.5 % rectal cream Place 1 Application rectally 2 (two) times daily. 05/21/24   Brimage, Vondra, DO  lidocaine -prilocaine  (EMLA ) cream Apply 1 Application topically daily.    [provider]  magnesium  chloride (SLOW-MAG) 64 MG TBEC SR tablet Take 1 tablet (64 mg total) by mouth daily. 11/08/23   Borders, Fonda SAUNDERS, NP  ondansetron  (ZOFRAN ) 8 MG tablet Take 1 tablet (8 mg total) by mouth every 8 (eight) hours as needed for nausea or vomiting. 05/05/24   [name], MD  oxyCODONE -acetaminophen  (PERCOCET/ROXICET) 5-325 MG tablet Take 1-2 tablets by mouth  every 6 (six) hours as needed for severe pain (pain score 7-10). 04/29/24   [name], MD  prochlorperazine  (COMPAZINE ) 10 MG tablet Take 1 tablet (10 mg total) by mouth every 6 (six) hours as needed for nausea or vomiting. 05/05/24   Finnegan, Timothy J, MD  spironolactone (ALDACTONE) 25 MG tablet Take 1 tablet by mouth every morning. 12/17/23 12/11/24  [provider]  tamsulosin  (FLOMAX ) 0.4 MG CAPS capsule Take 1 capsule (0.4 mg total) by mouth daily after supper. 02/04/24   Lenn Aran, MD  Vibegron  (GEMTESA ) 75 MG TABS Take 1 tablet (75 mg total) by mouth daily for 28 days. 07/17/24 08/14/24  Francisca Redell BROCKS, MD    Physical Exam: Vitals:   07/29/24 1911 07/29/24 2039 07/29/24 2045 07/29/24 2050  BP:   (!) 92/56   Pulse:  (!) 33 (!) 55 (!) 42  Resp:    10  Temp: 98 F (36.7 C)  TempSrc: Oral     SpO2:  100% 100% 100%  Weight:      Height:       Physical Exam  Labs on Admission: I have personally reviewed following labs and imaging studies  CBC: Recent Labs  Lab 07/28/24 1448 07/29/24 1519  WBC 4.1 5.0  NEUTROABS 3.5 4.0  HGB 7.2* 8.0*  HCT 22.3* 24.7*  MCV 89.2 90.1  PLT 41* 38*   Basic Metabolic Panel: Recent Labs  Lab 07/28/24 1448 07/29/24 1519  NA 130* 131*  K 4.5 4.3  CL 104 104  CO2 17* 18*  GLUCOSE 98 91  BUN 88* 87*  CREATININE 2.35* 2.63*  CALCIUM  8.6* 8.8*  MG 2.1  --    GFR: Estimated Creatinine Clearance: 26.9 mL/min (A) (by C-G formula based on SCr of 2.63 mg/dL (H)). Liver Function Tests: Recent Labs  Lab 07/28/24 1448 07/29/24 1519  AST 19 23  ALT 15 17  ALKPHOS 211* 228*  BILITOT 1.7* 1.7*  PROT 7.3 7.8  ALBUMIN 3.8 3.8   Recent Labs  Lab 07/29/24 1721  LIPASE 23   No results for input(s): AMMONIA in the last 168 hours. Coagulation Profile: No results for input(s): INR, PROTIME in the last 168 hours. Cardiac Enzymes: No results for input(s): CKTOTAL, CKMB, CKMBINDEX, TROPONINI in the last  168 hours. BNP (last 3 results) No results for input(s): PROBNP in the last 8760 hours. HbA1C: No results for input(s): HGBA1C in the last 72 hours. CBG: No results for input(s): GLUCAP in the last 168 hours. Lipid Profile: No results for input(s): CHOL, HDL, LDLCALC, TRIG, CHOLHDL, LDLDIRECT in the last 72 hours. Thyroid  Function Tests: No results for input(s): TSH, T4TOTAL, FREET4, T3FREE, THYROIDAB in the last 72 hours. Anemia Panel: No results for input(s): VITAMINB12, FOLATE, FERRITIN, TIBC, IRON, RETICCTPCT in the last 72 hours. Urine analysis:    Component Value Date/Time   COLORURINE YELLOW (A) 07/14/2024 1009   APPEARANCEUR CLEAR (A) 07/14/2024 1009   APPEARANCEUR Hazy 07/24/2012 2039   LABSPEC 1.011 07/14/2024 1009   LABSPEC 1.014 07/24/2012 2039   PHURINE 5.0 07/14/2024 1009   GLUCOSEU NEGATIVE 07/14/2024 1009   GLUCOSEU Negative 07/24/2012 2039   HGBUR NEGATIVE 07/14/2024 1009   BILIRUBINUR NEGATIVE 07/14/2024 1009   BILIRUBINUR Negative 07/24/2012 2039   KETONESUR NEGATIVE 07/14/2024 1009   PROTEINUR NEGATIVE 07/14/2024 1009   NITRITE NEGATIVE 07/14/2024 1009   LEUKOCYTESUR NEGATIVE 07/14/2024 1009   LEUKOCYTESUR Trace 07/24/2012 2039    Radiological Exams on Admission: CT ABDOMEN PELVIS WO CONTRAST Result Date: 07/29/2024 EXAM: CT ABDOMEN AND PELVIS WITHOUT CONTRAST 07/29/2024 07:15:56 PM TECHNIQUE: CT of the abdomen and pelvis was performed without the administration of intravenous contrast. Multiplanar reformatted images are provided for review. Automated exposure control, iterative reconstruction, and/or weight-based adjustment of the mA/kV was utilized to reduce the radiation dose to as low as reasonably achievable. COMPARISON: PET/CT dated 04/14/2024. CLINICAL HISTORY: Abdominal pain, acute, nonlocalized. Sent by PCP after positive stool sample for C-diff. Patient has hemorrhoids that cause him rectal pain. Reports he has  liver cancer. Currently receives chemo every three weeks. History of Prostate cancer, colon cancer and multiple myeloma. Reports history of stroke. FINDINGS: LOWER CHEST: Cardiomegaly. Linear scarring and atelectasis in the left lower lobe. LIVER: Nodular hepatic contour. Possible subtle liver metastases on PET are not evident on this non-contrast CT. GALLBLADDER AND BILE DUCTS: Layering gallstones versus gallbladder sludge (image 37) without convincing findings to suggest acute cholecystitis. No biliary ductal dilatation. SPLEEN:  No acute abnormality. PANCREAS: No acute abnormality. ADRENAL GLANDS: No acute abnormality. KIDNEYS, URETERS AND BLADDER: No stones in the kidneys or ureters. No hydronephrosis. No perinephric or periureteral stranding. Associated bladder wall thickening, favoring sequelae of chronic bilateral obstruction, although mild perivesical stranding raises the possibility of cystitis (image 71). GI AND BOWEL: Stomach demonstrates no acute abnormality. Status post right hemicolectomy. There is no bowel obstruction. PERITONEUM AND RETROPERITONEUM: Mild upper abdominal and mesenteric fluid/ascites. No free air. VASCULATURE: Atherosclerotic calcifications of the abdominal aorta and branch vessels. Aorta is normal in caliber. LYMPH NODES: 2.1 cm short axis right retrocaval nodal metastasis (image 34), similar to recent PET. 13 mm short axis right retrocrural nodal metastasis (image 19), mildly progressive . REPRODUCTIVE ORGANS: Prostatomegaly, suggesting BPH. BONES AND SOFT TISSUES: Mild degenerative changes of the thoracolumbar spine. No acute osseous abnormality. No focal soft tissue abnormality. IMPRESSION: 1. Status post right hemicolectomy. Retrocrural and para-aortic nodal metastases, mildly progressive. 2. Suspected cirrhosis. Suspected subtle hepatic metastases are not well visualized. Mild ascites. 3. Prostatomegaly, suggesting BPH. 4. Possible cystitis, equivocal. Electronically signed by:  Pinkie Pebbles MD 07/29/2024 07:35 PM EDT RP Workstation: [id number]   Data Reviewed for HPI: Relevant notes from primary care and specialist visits, past discharge summaries as available in EHR, including Care Everywhere. Prior diagnostic testing as pertinent to current admission diagnoses Updated medications and problem lists for reconciliation ED course, including vitals, labs, imaging, treatment and response to treatment Triage notes, nursing and pharmacy notes and ED provider's notes Notable results as noted above in HPI      Assessment and Plan: * Acute infectious diarrhea C. difficile antigen positive, toxin negative Enteroaggregated E. coli positive Patient with persistent diarrhea x 1 month now with abdominal pain Will treat for C. difficile with vancomycin  125 mg p.o. 4 times daily Oral hydration, gentle IV hydration given HFrEF Pain control Consider ID consult  Hypotension Essential hypertension Patient with soft blood pressures likely related to dehydration from ongoing diarrhea Hold all blood pressure lowering meds Cautious hydration in the setting of HFrEF and cardiomyopathy  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 03/2024 No bleeding reported Hemoglobin at baseline  CKD (chronic kidney disease) stage 4, GFR 15-29 ml/min (HCC) Metabolic acidosis, possibly acute Renal function at baseline Acidosis likely related to diarrhea and dehydration Mild hydration in view of HFrEF   HFrEF (heart failure with reduced ejection fraction) (HCC) Idiopathic cardiomyopathy: Improved EF 40% --> 55% Clinically euvolemic to dry Monitor for fluid overload in view of hydration Holding Bumex  and spironolactone due to soft blood pressures Daily weights  Permanent atrial fibrillation (HCC) Chronic anticoagulation Patient with baseline bradycardia, not on rate control agents Continue Eliquis   Multiple myeloma (HCC) Colon cancer s/p hemicolectomy and  chemotherapy History of prostate cancer Thrombocytopenia and anemia, likely chemotherapy induced No acute issues suspected    DVT prophylaxis: apixaban   Consults: none  Advance Care Planning:   Code Status: Prior   Family Communication: none  Disposition Plan: Back to previous home environment  Severity of Illness: The appropriate patient status for this patient is OBSERVATION. Observation status is judged to be reasonable and necessary in order to provide the required intensity of service to ensure the patient's safety. The patient's presenting symptoms, physical exam findings, and initial radiographic and laboratory data in the context of their medical condition is felt to place them at decreased risk for further clinical deterioration. Furthermore, it is anticipated that the patient will be medically stable for discharge from the hospital  within 2 midnights of admission.   Author: Delayne LULLA Solian, MD 07/29/2024 10:05 PM  For on call review [email or web address].

## 2024-07-29 NOTE — Assessment & Plan Note (Signed)
 Chronic anticoagulation Patient with baseline bradycardia, not on rate control agents Continue Eliquis 

## 2024-07-29 NOTE — Assessment & Plan Note (Signed)
 C. difficile antigen positive, toxin negative Enteroaggregated E. coli positive Patient with persistent diarrhea x 1 month now with abdominal pain Will treat for C. difficile with vancomycin  125 mg p.o. 4 times daily Oral hydration, gentle IV hydration given HFrEF Pain control Consider ID consult

## 2024-07-29 NOTE — Assessment & Plan Note (Signed)
 Metabolic acidosis, possibly acute Renal function at baseline Acidosis likely related to diarrhea and dehydration Mild hydration in view of HFrEF

## 2024-07-29 NOTE — Assessment & Plan Note (Signed)
 No bleeding reported Hemoglobin at baseline

## 2024-07-30 ENCOUNTER — Encounter: Payer: Self-pay | Admitting: Oncology

## 2024-07-30 ENCOUNTER — Telehealth (HOSPITAL_COMMUNITY): Payer: Self-pay | Admitting: Pharmacy Technician

## 2024-07-30 ENCOUNTER — Encounter

## 2024-07-30 ENCOUNTER — Other Ambulatory Visit (HOSPITAL_COMMUNITY): Payer: Self-pay

## 2024-07-30 ENCOUNTER — Telehealth: Payer: Self-pay | Admitting: *Deleted

## 2024-07-30 ENCOUNTER — Other Ambulatory Visit: Payer: Self-pay

## 2024-07-30 DIAGNOSIS — B9620 Unspecified Escherichia coli [E. coli] as the cause of diseases classified elsewhere: Secondary | ICD-10-CM | POA: Diagnosis not present

## 2024-07-30 DIAGNOSIS — A0472 Enterocolitis due to Clostridium difficile, not specified as recurrent: Secondary | ICD-10-CM | POA: Diagnosis not present

## 2024-07-30 DIAGNOSIS — D61818 Other pancytopenia: Secondary | ICD-10-CM | POA: Diagnosis not present

## 2024-07-30 DIAGNOSIS — A09 Infectious gastroenteritis and colitis, unspecified: Secondary | ICD-10-CM | POA: Diagnosis not present

## 2024-07-30 LAB — CBC
HCT: 21.6 % — ABNORMAL LOW (ref 39.0–52.0)
Hemoglobin: 7.1 g/dL — ABNORMAL LOW (ref 13.0–17.0)
MCH: 29.3 pg (ref 26.0–34.0)
MCHC: 32.9 g/dL (ref 30.0–36.0)
MCV: 89.3 fL (ref 80.0–100.0)
Platelets: 33 K/uL — ABNORMAL LOW (ref 150–400)
RBC: 2.42 MIL/uL — ABNORMAL LOW (ref 4.22–5.81)
RDW: 19.2 % — ABNORMAL HIGH (ref 11.5–15.5)
WBC: 3.6 K/uL — ABNORMAL LOW (ref 4.0–10.5)
nRBC: 0 % (ref 0.0–0.2)

## 2024-07-30 LAB — BPAM RBC
Blood Product Expiration Date: 202510172359
ISSUE DATE / TIME: 202509241042
Unit Type and Rh: 202510172359
Unit Type and Rh: 5100

## 2024-07-30 LAB — BASIC METABOLIC PANEL WITH GFR
Anion gap: 8 (ref 5–15)
BUN: 79 mg/dL — ABNORMAL HIGH (ref 8–23)
CO2: 18 mmol/L — ABNORMAL LOW (ref 22–32)
Calcium: 8.5 mg/dL — ABNORMAL LOW (ref 8.9–10.3)
Chloride: 110 mmol/L (ref 98–111)
Creatinine, Ser: 2.25 mg/dL — ABNORMAL HIGH (ref 0.61–1.24)
GFR, Estimated: 31 mL/min — ABNORMAL LOW (ref >60)
Glucose, Bld: 83 mg/dL (ref 70–99)
Potassium: 4.4 mmol/L (ref 3.5–5.1)
Sodium: 136 mmol/L (ref 135–145)

## 2024-07-30 LAB — TYPE AND SCREEN
ABO/RH(D): O POS
Antibody Screen: NEGATIVE
Unit division: 0

## 2024-07-30 LAB — MAGNESIUM: Magnesium: 1.8 mg/dL (ref 1.7–2.4)

## 2024-07-30 MED ORDER — SODIUM CHLORIDE 0.9 % IV SOLN: INTRAVENOUS | Status: DC

## 2024-07-30 MED ORDER — VANCOMYCIN HCL 125 MG PO CAPS
125.0000 mg | ORAL_CAPSULE | Freq: Four times a day (QID) | ORAL | 0 refills | Status: AC
  Filled 2024-07-30: qty 40, 10d supply, fill #0

## 2024-07-30 MED ORDER — HYDROCORTISONE (PERIANAL) 2.5 % EX CREA
TOPICAL_CREAM | Freq: Three times a day (TID) | CUTANEOUS | Status: DC
Start: 2024-07-30 — End: 2024-07-30
  Filled 2024-07-30: qty 28.35

## 2024-07-30 MED ORDER — FIDAXOMICIN 200 MG PO TABS: 200.0000 mg | ORAL_TABLET | Freq: Two times a day (BID) | ORAL | 0 refills | Status: DC

## 2024-07-30 MED ORDER — HEMORRHOIDAL 0.25-14-74.9 % RE OINT: 1.0000 | TOPICAL_OINTMENT | Freq: Two times a day (BID) | RECTAL | 0 refills | Status: DC | PRN

## 2024-07-30 NOTE — Plan of Care (Signed)

## 2024-07-30 NOTE — Consult Note (Addendum)
 NAME: Chris George  DOB: 1956/09/08  MRN: [id number]  Date/Time: 07/30/2024 11:13 AM  REQUESTING PROVIDER: Dr.Agbata Subjective:  REASON FOR CONSULT: Cdiff  Chart reviewed in great detail Spoke to the patient Chris George is a 68 y.o. male with a history of A-fib, CHF, CVA, hyperlipidemia, hypertension, anemia, prostate CA status post radiation,  colon cancer with liver mets on FOLFIRI followed by Dr. 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  They sent stool studies. He was sent to the ED on 07/29/2024 because the C. difficile test came back positive. In the ED vitals were   07/29/24 10:49  BP 93/50 !  Temp 97.5 F (36.4 C) !  Pulse Rate 55 !  Resp 18  SpO2 100 %    Latest Reference Range & Units 07/29/24 15:19  WBC 4.0 - 10.5 K/uL 5.0  Hemoglobin 13.0 - 17.0 g/dL 8.0 (L)  HCT 60.9 - 47.9 % 24.7 (L)  Platelets 150 - 400 K/uL 38 (L) [1]  Creatinine 0.61 - 1.24 mg/dL 7.36 (H)  He has been started on vancomycin  p.o. T GI panel PCR also had enteral aggregative  E. coli I am asked to see the patient for the 2 abnormalities in the stool  Past Medical History:  Diagnosis Date   A-fib (HCC)    Anemia    Arthritis    ankles   Cancer (HCC)    multiple myeloma   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HCC) 10/26/2019   Cardiomyopathy (HCC)    Chemotherapy-induced peripheral neuropathy    CHF (congestive heart failure) (HCC)    Depression    Dysrhythmia    atrial fib   History of CVA (cerebrovascular accident)    HLD (hyperlipidemia)    Hypercholesteremia    Hypertension    Moderate tricuspid insufficiency    Multiple myeloma (HCC)    not being treated right now per pt 11/11/19   Pneumonia    Sleep apnea    No CPAP.  OSA resolved with wt loss.    Past Surgical History:  Procedure  Laterality Date   ATRIAL ABLATION SURGERY     CARDIAC SURGERY     A-Fib Ablations   COLONOSCOPY N/A 03/17/2024   Procedure: COLONOSCOPY;  Surgeon: Jinny Carmine, MD;  Location: [facility] ENDOSCOPY;  Service: Endoscopy;  Laterality: N/A;   COLONOSCOPY WITH PROPOFOL  N/A 10/26/2019   Procedure: COLONOSCOPY WITH BIOPSY;  Surgeon: Jinny Carmine, MD;  Location: [facility] SURGERY CNTR;  Service: Endoscopy;  Laterality: N/A;   COLONOSCOPY WITH PROPOFOL  N/A 09/03/2022   Procedure: COLONOSCOPY WITH PROPOFOL ;  Surgeon: Jinny Carmine, MD;  Location: [facility] SURGERY CNTR;  Service: Endoscopy;  Laterality: N/A;   ESOPHAGOGASTRODUODENOSCOPY N/A 03/17/2024   Procedure: GI Bleeding;  Surgeon: Jinny Carmine, MD;  Location: [facility] ENDOSCOPY;  Service: Endoscopy;  Laterality: N/A;  GI Bleeding, EGD   ESOPHAGOGASTRODUODENOSCOPY (EGD) WITH PROPOFOL  N/A 10/26/2019   Procedure: ESOPHAGOGASTRODUODENOSCOPY (EGD) WITH BIOPSY;  Surgeon: Jinny Carmine, MD;  Location: [facility] SURGERY CNTR;  Service: Endoscopy;  Laterality: N/A;  sleep apnea   LAPAROSCOPIC PARTIAL COLECTOMY Right 11/17/2019   Procedure: LAPAROSCOPIC PARTIAL COLECTOMY RIGHT EXTENDED;  Surgeon: Desiderio Schanz, MD;  Location: [facility] ORS;  Service: General;  Laterality: Right;   PORTACATH PLACEMENT N/A 12/28/2019   Procedure: INSERTION PORT-A-CATH Left subclavian;  Surgeon: Desiderio Schanz, MD;  Location: [facility] ORS;  Service:  General;  Laterality: N/A;    Social History   Socioeconomic History   Marital status: Single    Spouse name: Not on file   Number of children: Not on file   Years of education: Not on file   Highest education level: Not on file  Occupational History   Not on file  Tobacco Use   Smoking status: Never    Passive exposure: Never   Smokeless tobacco: Never  Vaping Use   Vaping status: Never Used  Substance and Sexual Activity   Alcohol use: Not Currently   Drug use: Never   Sexual activity: Yes    Birth control/protection: None  Other Topics Concern    Not on file  Social History Narrative   Not on file   Social Drivers of Health   Financial Resource Strain: Medium Risk (06/09/2024)   Received from [facility]   Overall Financial Resource Strain (CARDIA)    Difficulty of Paying Living Expenses: Somewhat hard  Food Insecurity: Food Insecurity Present (07/29/2024)   Hunger Vital Sign    Worried About Running Out of Food in the Last Year: Sometimes true    Ran Out of Food in the Last Year: Sometimes true  Transportation Needs: No Transportation Needs (07/29/2024)   PRAPARE - Administrator, Civil Service (Medical): No    Lack of Transportation (Non-Medical): No  Physical Activity: Inactive (11/08/2023)   Exercise Vital Sign    Days of Exercise per Week: 0 days    Minutes of Exercise per Session: 0 min  Stress: Stress Concern Present (05/28/2019)   [employer] of Occupational Health - Occupational Stress Questionnaire    Feeling of Stress : To some extent  Social Connections: Moderately Integrated (07/29/2024)   Social Connection and Isolation Panel    Frequency of Communication with Friends and Family: More than three times a week    Frequency of Social Gatherings with Friends and Family: More than three times a week    Attends Religious Services: More than 4 times per year    Active Member of [employer] or Organizations: Yes    Attends Engineer, structural: More than 4 times per year    Marital Status: Divorced  Intimate Partner Violence: Not At Risk (07/29/2024)   Humiliation, Afraid, Rape, and Kick questionnaire    Fear of Current or Ex-Partner: No    Emotionally Abused: No    Physically Abused: No    Sexually Abused: No    Family History  Problem Relation Age of Onset   Healthy Mother    No Known Allergies I? Current Facility-Administered Medications  Medication Dose Route Frequency Provider Last Rate Last Admin   0.9 %  sodium chloride  infusion   Intravenous Continuous Agbata,  Tochukwu, MD       acetaminophen  (TYLENOL ) tablet 650 mg  650 mg Oral Q6H PRN Duncan, Hazel V, MD       Or   acetaminophen  (TYLENOL ) suppository 650 mg  650 mg Rectal Q6H PRN Cleatus Delayne GAILS, MD       apixaban  (ELIQUIS ) tablet 5 mg  5 mg Oral BID Duncan, Hazel V, MD   5 mg at 07/30/24 9043   heparin  lock flush 100 unit/mL  500 Units Intracatheter Daily PRN Borders, Joshua R, NP       heparin  lock flush 100 unit/mL  250 Units Intracatheter PRN Borders, Joshua R, NP       HYDROcodone -acetaminophen  (NORCO/VICODIN) 5-325 MG per tablet 1-2  tablet  1-2 tablet Oral Q4H PRN Cleatus Delayne GAILS, MD       morphine  (PF) 2 MG/ML injection 2 mg  2 mg Intravenous Q2H PRN Cleatus Delayne GAILS, MD       ondansetron  (ZOFRAN ) tablet 4 mg  4 mg Oral Q6H PRN Cleatus Delayne GAILS, MD       Or   ondansetron  (ZOFRAN ) injection 4 mg  4 mg Intravenous Q6H PRN Cleatus Delayne GAILS, MD       oxyCODONE -acetaminophen  (PERCOCET/ROXICET) 5-325 MG per tablet 1-2 tablet  1-2 tablet Oral Q6H PRN Cleatus Delayne GAILS, MD       sodium chloride  flush (NS) 0.9 % injection 10 mL  10 mL Intracatheter PRN Borders, Fonda SAUNDERS, NP       sodium chloride  flush (NS) 0.9 % injection 3 mL  3 mL Intracatheter PRN [name], MD       sodium chloride  flush (NS) 0.9 % injection 3 mL  3 mL Intracatheter PRN Borders, Fonda SAUNDERS, NP       tamsulosin  (FLOMAX ) capsule 0.4 mg  0.4 mg Oral QPC supper Cleatus Delayne GAILS, MD       vancomycin  (VANCOCIN ) capsule 125 mg  125 mg Oral QID Jacolyn Pae, MD   125 mg at 07/30/24 9043   Facility-Administered Medications Ordered in Other Encounters  Medication Dose Route Frequency Provider Last Rate Last Admin   heparin  lock flush 100 unit/mL  500 Units Intravenous Once Finnegan, Timothy J, MD         Abtx:  Anti-infectives (From admission, onward)    Start     Dose/Rate Route Frequency Ordered Stop   07/29/24 2200  vancomycin  (VANCOCIN ) capsule 125 mg        125 mg Oral 4 times daily 07/29/24 2009 08/08/24 2159    07/29/24 2200  vancomycin  (VANCOCIN ) capsule 125 mg  Status:  Discontinued        125 mg Oral 4 times daily 07/29/24 2100 07/29/24 2106       REVIEW OF SYSTEMS:  Const: negative fever, negative chills, negative weight loss Eyes: negative diplopia or visual changes, negative eye pain ENT: negative coryza, negative sore throat Resp: negative cough, hemoptysis, dyspnea Cards: negative for chest pain, palpitations, lower extremity edema GU: negative for frequency, dysuria and hematuria GI: Negative for abdominal pain, has diarrhea, has hemorrhoids and occasional bleeding,  Skin: negative for rash and pruritus Heme: negative for easy bruising and gum/nose bleeding MS: negative for myalgias, arthralgias, back pain and muscle weakness Neurolo:negative for headaches, dizziness, vertigo, memory problems  Psych: negative for feelings of anxiety, depression  Endocrine: negative for thyroid , diabetes Allergy/Immunology- negative for any medication or food allergies ? Objective:  VITALS:  BP (!) 93/49 (BP Location: Right Arm)   Pulse 69   Temp 97.7 F (36.5 C) (Oral)   Resp 18   Ht 5' 9 (1.753 m)   Wt 79.9 kg   SpO2 99%   BMI 26.03 kg/m   PHYSICAL EXAM:  General: Alert, cooperative, no distress, appears stated age.  Head: Normocephalic, without obvious abnormality, atraumatic. Eyes: Conjunctivae clear, anicteric sclerae. Pupils are equal ENT Nares normal. No drainage or sinus tenderness. Lips, mucosa, and tongue normal. No Thrush Neck: Supple, symmetrical, no adenopathy, thyroid : non tender no carotid bruit and no JVD. Back: No CVA tenderness. Lungs: Clear to auscultation bilaterally. No Wheezing or Rhonchi. No rales. Heart: Systolic murmur, irregular Port on the chest wall Abdomen: Soft, non-tender,not distended. Bowel sounds normal. No  masses Extremities: atraumatic, no cyanosis. No edema. No clubbing Skin: No rashes or lesions. Or bruising Lymph: Cervical, supraclavicular  normal. Neurologic: Has some expressive aphasia, nonfocal otherwise Pertinent Labs Lab Results CBC    Component Value Date/Time   WBC 3.6 (L) 07/30/2024 0553   RBC 2.42 (L) 07/30/2024 0553   HGB 7.1 (L) 07/30/2024 0553   HGB 7.3 (L) 07/21/2024 0834   HGB 14.3 05/13/2013 1116   HCT 21.6 (L) 07/30/2024 0553   HCT 42.9 05/13/2013 1116   PLT 33 (L) 07/30/2024 0553   PLT 91 (L) 07/21/2024 0834   PLT 169 05/13/2013 1116   MCV 89.3 07/30/2024 0553   MCV 84 05/13/2013 1116   MCH 29.3 07/30/2024 0553   MCHC 32.9 07/30/2024 0553   RDW 19.2 (H) 07/30/2024 0553   RDW 13.9 05/13/2013 1116   LYMPHSABS 0.3 (L) 07/29/2024 1519   LYMPHSABS 0.9 (L) 05/13/2013 1116   MONOABS 0.5 07/29/2024 1519   MONOABS 0.4 05/13/2013 1116   EOSABS 0.1 07/29/2024 1519   EOSABS 0.1 05/13/2013 1116   BASOSABS 0.0 07/29/2024 1519   BASOSABS 0.0 05/13/2013 1116       Latest Ref Rng & Units 07/30/2024    5:53 AM 07/29/2024    3:19 PM 07/28/2024    2:48 PM  CMP  Glucose 70 - 99 mg/dL 83  91  98   BUN 8 - 23 mg/dL 79  87  88   Creatinine 0.61 - 1.24 mg/dL 7.74  7.36  7.64   Sodium 135 - 145 mmol/L 136  131  130   Potassium 3.5 - 5.1 mmol/L 4.4  4.3  4.5   Chloride 98 - 111 mmol/L 110  104  104   CO2 22 - 32 mmol/L 18  18  17    Calcium  8.9 - 10.3 mg/dL 8.5  8.8  8.6   Total Protein 6.5 - 8.1 g/dL  7.8  7.3   Total Bilirubin 0.0 - 1.2 mg/dL  1.7  1.7   Alkaline Phos 38 - 126 U/L  228  211   AST 15 - 41 U/L  23  19   ALT 0 - 44 U/L  17  15       Microbiology: Recent Results (from the past 240 hours)  Gastrointestinal Panel by PCR , Stool     Status: Abnormal   Collection Time: 07/29/24 10:26 AM   Specimen: Stool  Result Value Ref Range Status   Campylobacter species NOT DETECTED NOT DETECTED Final   Plesimonas shigelloides NOT DETECTED NOT DETECTED Final   Salmonella species NOT DETECTED NOT DETECTED Final   Yersinia enterocolitica NOT DETECTED NOT DETECTED Final   Vibrio species NOT DETECTED NOT  DETECTED Final   Vibrio cholerae NOT DETECTED NOT DETECTED Final   Enteroaggregative E coli (EAEC) DETECTED (A) NOT DETECTED Final    Comment: RESULT CALLED TO, READ BACK BY AND VERIFIED WITH: CHASE ISLEY [id number] 07/29/24 MU    Enteropathogenic E coli (EPEC) NOT DETECTED NOT DETECTED Final   Enterotoxigenic E coli (ETEC) NOT DETECTED NOT DETECTED Final   Shiga like toxin producing E coli (STEC) NOT DETECTED NOT DETECTED Final   Shigella/Enteroinvasive E coli (EIEC) NOT DETECTED NOT DETECTED Final   Cryptosporidium NOT DETECTED NOT DETECTED Final   Cyclospora cayetanensis NOT DETECTED NOT DETECTED Final   Entamoeba histolytica NOT DETECTED NOT DETECTED Final   Giardia lamblia NOT DETECTED NOT DETECTED Final   Adenovirus F40/41 NOT DETECTED NOT DETECTED Final   Astrovirus  NOT DETECTED NOT DETECTED Final   Norovirus GI/GII NOT DETECTED NOT DETECTED Final   Rotavirus A NOT DETECTED NOT DETECTED Final   Sapovirus (I, II, IV, and V) NOT DETECTED NOT DETECTED Final    Comment: Performed at [facility], [address] [place], [place] [address]  C difficile quick screen w PCR reflex     Status: Abnormal   Collection Time: 07/29/24 10:27 AM   Specimen: STOOL  Result Value Ref Range Status   C Diff antigen POSITIVE (A) NEGATIVE Final   C Diff toxin NEGATIVE NEGATIVE Final   C Diff interpretation Results are indeterminate. See PCR results.  Final    Comment: Performed at [facility], [address] [place], [place] [address]  C. Diff by PCR, Reflexed     Status: Abnormal   Collection Time: 07/29/24 10:27 AM  Result Value Ref Range Status   Toxigenic C. Difficile by PCR POSITIVE (A) NEGATIVE Final    Comment: Positive for toxigenic C. difficile with little to no toxin production. Only treat if clinical presentation suggests symptomatic illness.   Hypervirulent Strain PRESUMPTIVE NEGATIVE PRESUMPTIVE NEGATIVE Final    Comment: Performed at [facility], [address] [place], [place] [address]     IMAGING RESULTS: CT abdomen shows Nodular hepatic contour, layering gallstones versus gallbladder sludge.  Possible subtle liver metastasis on PET or not evident on this noncontrast CT.  I have personally reviewed the films ? Impression/Recommendation ? Ca colon s/p chemo FOLFIRI on 9/16  Diarrhea following it likely due to chemo   Also has cdiff diarrhea- antigen positive, toxin neg and pcr positive Can do either for Doxy mycin or vancomycin  depending on his insurance coverage If latter will need for 14 days  Enteroaggregate of E. coli in the stool and does not need any antibiotic for that  Pancytopenia   CKD  Ca prostate status post radiation  Smoldering myeloma observation Used to be on chemo many years ago  A-fib status post cardiac ablation On apixaban   CHF/hypertension on spironolactone, Bumex , Lipitor  This consult involved complex antimicrobial management.  _I have personally spent  --60-minutes involved in face-to-face and non-face-to-face activities for this patient on the day of the visit. Professional time spent includes the following activities: Preparing to see the patient (review of tests), Obtaining and/or reviewing separately obtained history (admission/discharge record), Performing a medically appropriate examination and/or evaluation , Ordering medications/tests/procedures, referring and communicating with other health care professionals, Documenting clinical information in the EMR, Independently interpreting results (not separately reported), Communicating results to the patient/family/caregiver, Counseling and educating the patient/family/caregiver and Care coordination (not separately reported).    ________________________________________________ Discussed with patient, and family at bedside note:  This document was prepared using Dragon voice recognition software and may include unintentional dictation errors.

## 2024-07-30 NOTE — Progress Notes (Signed)
 MEWS Progress Note  Patient Details Name: JETT FUKUDA MRN: [id number] DOB: 1956-07-24 Today's Date: 07/30/2024   MEWS Flowsheet Documentation:  Assess: MEWS Score Temp: 98 F (36.7 C) BP: (!) 96/58 MAP (mmHg): 72 Pulse Rate: (!) 42 ECG Heart Rate: (!) 46 Resp: 16 Level of Consciousness: Alert SpO2: 100 % O2 Device: Room Air Assess: MEWS Score MEWS Temp: 0 MEWS Systolic: 1 MEWS Pulse: 1 MEWS RR: 0 MEWS LOC: 0 MEWS Score: 2 MEWS Score Color: Yellow Assess: SIRS CRITERIA SIRS Temperature : 0 SIRS Respirations : 0 SIRS Pulse: 0 SIRS WBC: 0 SIRS Score Sum : 0 Assess: if the MEWS score is Yellow or Red Were vital signs accurate and taken at a resting state?: Yes Does the patient meet 2 or more of the SIRS criteria?: Yes Does the patient have a confirmed or suspected source of infection?: Yes MEWS guidelines implemented :  (All SIRS criteria are pt's baseline (Softer Blood Pressures and Bradycardia) d/t cardiac history)        Lamar Satterfield 07/30/2024, 5:46 AM

## 2024-07-30 NOTE — Discharge Summary (Addendum)
 Total hysician Discharge Summary   Patient: Chris George MRN: [id number] DOB: 1955-12-31  Admit date:     07/29/2024  Discharge date: 07/30/24  Discharge Physician: Bianka Liberati   PCP: Jeffie Cheryl FORBES, MD   Recommendations at discharge:   Complete a 10-day course of Dificid  Hold spironolactone and Bumex  due to relative hypotension Follow-up with PCP and oncology  Discharge Diagnoses: Principal Problem:   Acute infectious diarrhea Active Problems:   Hypotension   Hypertension, essential, benign   Cardiomyopathy, idiopathic (HCC)   HFrEF (heart failure with reduced ejection fraction) (HCC)   CKD (chronic kidney disease) stage 4, GFR 15-29 ml/min (HCC)   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 *  Hospital Course:  Chris George is a 68 y.o. 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  He tested C. difficile antigen positive/toxin negative as well as enteroaggregative E. coli positive.  Patient has been having diarrhea for the past several weeks which has become more profuse over the course of the last few days .  He denies abdominal pain, vomiting, fever or chills.  He went into see his PCP today who did stool studies which tested positive for E. coli and  subsequently C. difficile.  In the ED he was afebrile, BP soft at 92/56, pulse in the mid 50s. Labs notable for normal WBC, Stable hemoglobin of 8, stable thrombocytopenia of 38.  Creatinine at baseline at 2.63 but with bicarb of 18.  Stable elevation of alk phos to 228 and total bili to 1.7. CT abdomen and pelvis showing chronic findings related to kidney cirrhosis suspected hepatic metastasis prostatomegaly and possible focal cystitis. Patient was treated with an NS bolus and started on oral vancomycin  Admission requested.     Assessment and Plan:  * Chronic infectious diarrhea C. difficile antigen positive, toxin negative Enteroaggregated E. coli positive Patient with persistent diarrhea x 1 month Denies having any abdominal pain, nausea or vomiting Had Dificid  called in as an outpatient but was referred to the ER due to stool PCR positive for Enteroaggregative E coli Received oral vancomycin  in patient Patient was seen and examined during rounds and I recommended 24-hour observation to monitor his electrolytes and blood pressure.  Patient declines further hospital stay since he is only on oral medication and was told he would need IV medication. I have explained to him that he does not require any IV antibiotics and will need to get 10 days of Dificid  on vancomycin . 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 Oral intake is appropriate Holding Bumex  and spironolactone due to soft blood pressures.  May be resumed by his PCP    Permanent atrial fibrillation [facility]) Chronic anticoagulation Patient with baseline bradycardia, not on rate control agents Continue Eliquis  Monitor closely for bleeding while on Eliquis  due to thrombocytopenia   Multiple myeloma (HCC) Colon cancer s/p hemicolectomy and chemotherapy History of prostate cancer s/p radiation therapy Thrombocytopenia and anemia, likely chemotherapy induced Was transfused 1 unit of packed RBC at the cancer center on 09/24 Monitor cell counts closely            Consultants: Infectious disease Procedures performed: None Disposition: Home Diet recommendation:  Discharge Diet Orders (From admission, onward)     Start     Ordered   07/30/24 0000  Diet - low sodium heart healthy        07/30/24 1211           Cardiac diet DISCHARGE MEDICATION: Allergies as of 07/30/2024   No Known Allergies      Medication List     PAUSE taking these medications    bumetanide  2 MG tablet Wait to take this until your doctor or other care provider tells you to start again. Commonly known as: BUMEX  Take 1.5 mg by mouth daily. Taking one in the morning.   spironolactone 25 MG tablet Wait to take this until your doctor or other care provider tells you to start again. Commonly known as: ALDACTONE Take 1 tablet by mouth every morning.       STOP taking these medications    allopurinol  100 MG tablet Commonly known as: ZYLOPRIM    azithromycin  500 MG tablet Commonly known as: ZITHROMAX    fidaxomicin  200 MG Tabs tablet Commonly known as: DIFICID        TAKE these medications    atorvastatin  80 MG  tablet Commonly known as: LIPITOR Take 80 mg by mouth daily.   calcitRIOL  0.25 MCG capsule Commonly known as: ROCALTROL  Take 0.25 mcg by mouth daily.   colchicine  0.6 MG tablet Take 1 tablet (0.6 mg total) by mouth daily as needed. Until flare resolves.   Eliquis  5 MG Tabs tablet Generic drug: apixaban  Take 5 mg by mouth 2 (two) times daily.   Gemtesa  75 MG Tabs Generic drug: Vibegron  Take 1 tablet (75 mg total) by mouth daily for 28 days.   hydrocortisone  2.5 % rectal cream Commonly known as: ANUSOL -HC Place 1 Application rectally 2 (two) times daily.   lidocaine -prilocaine  cream Commonly known as: EMLA  Apply 1 Application topically daily.   magnesium  chloride 64 MG Tbec SR tablet Commonly known as: SLOW-MAG Take 1 tablet (64 mg total) by mouth daily.   ondansetron  8 MG tablet Commonly known as: ZOFRAN  Take 1 tablet (8 mg total) by mouth every 8 (eight) hours as needed for nausea or vomiting.   oxyCODONE -acetaminophen  5-325 MG tablet Commonly known as: PERCOCET/ROXICET Take 1-2 tablets by mouth every 6 (six) hours as needed for severe pain (pain score 7-10).  prochlorperazine  10 MG tablet Commonly known as: COMPAZINE  Take 1 tablet (10 mg total) by mouth every 6 (six) hours as needed for nausea or vomiting.   tamsulosin  0.4 MG Caps capsule Commonly known as: FLOMAX  Take 1 capsule (0.4 mg total) by mouth daily after supper.   vancomycin  125 MG capsule Commonly known as: VANCOCIN  Take 1 capsule (125 mg total) by mouth 4 (four) times daily for 10 days.        Discharge Exam: Filed Weights   07/29/24 1504 07/29/24 2308  Weight: 79.4 kg 79.9 kg    Vitals and nursing note reviewed.  Constitutional:      General: He is not in acute distress. HENT:     Head: Normocephalic and atraumatic.  Cardiovascular:     Rate and Rhythm: Regular rhythm. Bradycardia present.     Heart sounds: Normal heart sounds.  Pulmonary:     Effort: Pulmonary effort is normal.      Breath sounds: Normal breath sounds.  Abdominal:     Palpations: Abdomen is soft.     Tenderness: There is no abdominal tenderness.  Neurological:     Mental Status: Mental status is at baseline.   Condition at discharge: stable  The results of significant diagnostics from this hospitalization (including imaging, microbiology, ancillary and laboratory) are listed below for reference.   Imaging Studies: CT ABDOMEN PELVIS WO CONTRAST Result Date: 07/29/2024 EXAM: CT ABDOMEN AND PELVIS WITHOUT CONTRAST 07/29/2024 07:15:56 PM TECHNIQUE: CT of the abdomen and pelvis was performed without the administration of intravenous contrast. Multiplanar reformatted images are provided for review. Automated exposure control, iterative reconstruction, and/or weight-based adjustment of the mA/kV was utilized to reduce the radiation dose to as low as reasonably achievable. COMPARISON: PET/CT dated 04/14/2024. CLINICAL HISTORY: Abdominal pain, acute, nonlocalized. Sent by PCP after positive stool sample for C-diff. Patient has hemorrhoids that cause him rectal pain. Reports he has liver cancer. Currently receives chemo every three weeks. History of Prostate cancer, colon cancer and multiple myeloma. Reports history of stroke. FINDINGS: LOWER CHEST: Cardiomegaly. Linear scarring and atelectasis in the left lower lobe. LIVER: Nodular hepatic contour. Possible subtle liver metastases on PET are not evident on this non-contrast CT. GALLBLADDER AND BILE DUCTS: Layering gallstones versus gallbladder sludge (image 37) without convincing findings to suggest acute cholecystitis. No biliary ductal dilatation. SPLEEN: No acute abnormality. PANCREAS: No acute abnormality. ADRENAL GLANDS: No acute abnormality. KIDNEYS, URETERS AND BLADDER: No stones in the kidneys or ureters. No hydronephrosis. No perinephric or periureteral stranding. Associated bladder wall thickening, favoring sequelae of chronic bilateral obstruction, although mild  perivesical stranding raises the possibility of cystitis (image 71). GI AND BOWEL: Stomach demonstrates no acute abnormality. Status post right hemicolectomy. There is no bowel obstruction. PERITONEUM AND RETROPERITONEUM: Mild upper abdominal and mesenteric fluid/ascites. No free air. VASCULATURE: Atherosclerotic calcifications of the abdominal aorta and branch vessels. Aorta is normal in caliber. LYMPH NODES: 2.1 cm short axis right retrocaval nodal metastasis (image 34), similar to recent PET. 13 mm short axis right retrocrural nodal metastasis (image 19), mildly progressive . REPRODUCTIVE ORGANS: Prostatomegaly, suggesting BPH. BONES AND SOFT TISSUES: Mild degenerative changes of the thoracolumbar spine. No acute osseous abnormality. No focal soft tissue abnormality. IMPRESSION: 1. Status post right hemicolectomy. Retrocrural and para-aortic nodal metastases, mildly progressive. 2. Suspected cirrhosis. Suspected subtle hepatic metastases are not well visualized. Mild ascites. 3. Prostatomegaly, suggesting BPH. 4. Possible cystitis, equivocal. Electronically signed by: Pinkie Pebbles MD 07/29/2024 07:35 PM EDT RP Workstation: [id number]  Microbiology: Results for orders placed or performed in visit on 07/29/24  Gastrointestinal Panel by PCR , Stool     Status: Abnormal   Collection Time: 07/29/24 10:26 AM   Specimen: Stool  Result Value Ref Range Status   Campylobacter species NOT DETECTED NOT DETECTED Final   Plesimonas shigelloides NOT DETECTED NOT DETECTED Final   Salmonella species NOT DETECTED NOT DETECTED Final   Yersinia enterocolitica NOT DETECTED NOT DETECTED Final   Vibrio species NOT DETECTED NOT DETECTED Final   Vibrio cholerae NOT DETECTED NOT DETECTED Final   Enteroaggregative E coli (EAEC) DETECTED (A) NOT DETECTED Final    Comment: RESULT CALLED TO, READ BACK BY AND VERIFIED WITH: CHASE ISLEY 1242 07/29/24 MU    Enteropathogenic E coli (EPEC) NOT DETECTED NOT DETECTED Final    Enterotoxigenic E coli (ETEC) NOT DETECTED NOT DETECTED Final   Shiga like toxin producing E coli (STEC) NOT DETECTED NOT DETECTED Final   Shigella/Enteroinvasive E coli (EIEC) NOT DETECTED NOT DETECTED Final   Cryptosporidium NOT DETECTED NOT DETECTED Final   Cyclospora cayetanensis NOT DETECTED NOT DETECTED Final   Entamoeba histolytica NOT DETECTED NOT DETECTED Final   Giardia lamblia NOT DETECTED NOT DETECTED Final   Adenovirus F40/41 NOT DETECTED NOT DETECTED Final   Astrovirus NOT DETECTED NOT DETECTED Final   Norovirus GI/GII NOT DETECTED NOT DETECTED Final   Rotavirus A NOT DETECTED NOT DETECTED Final   Sapovirus (I, II, IV, and V) NOT DETECTED NOT DETECTED Final    Comment: Performed at [facility], [address] [place], [place] [address]  C difficile quick screen w PCR reflex     Status: Abnormal   Collection Time: 07/29/24 10:27 AM   Specimen: STOOL  Result Value Ref Range Status   C Diff antigen POSITIVE (A) NEGATIVE Final   C Diff toxin NEGATIVE NEGATIVE Final   C Diff interpretation Results are indeterminate. See PCR results.  Final    Comment: Performed at [facility], [address] [place], [place] [address]  C. Diff by PCR, Reflexed     Status: Abnormal   Collection Time: 07/29/24 10:27 AM  Result Value Ref Range Status   Toxigenic C. Difficile by PCR POSITIVE (A) NEGATIVE Final    Comment: Positive for toxigenic C. difficile with little to no toxin production. Only treat if clinical presentation suggests symptomatic illness.   Hypervirulent Strain PRESUMPTIVE NEGATIVE PRESUMPTIVE NEGATIVE Final    Comment: Performed at [facility], [address] [place], [place] [address]   *Note: Due to a large number of results and/or encounters for the requested time period, some results have not been displayed. A complete set of results can be found in Results Review.    Labs: CBC: Recent Labs  Lab 07/28/24 1448 07/29/24 1519  07/30/24 0553  WBC 4.1 5.0 3.6*  NEUTROABS 3.5 4.0  --   HGB 7.2* 8.0* 7.1*  HCT 22.3* 24.7* 21.6*  MCV 89.2 90.1 89.3  PLT 41* 38* 33*   Basic Metabolic Panel: Recent Labs  Lab 07/28/24 1448 07/29/24 1519 07/30/24 0553 07/30/24 1112  NA 130* 131* 136  --   K 4.5 4.3 4.4  --   CL 104 104 110  --   CO2 17* 18* 18*  --   GLUCOSE 98 91 83  --   BUN 88* 87* 79*  --   CREATININE 2.35* 2.63* 2.25*  --   CALCIUM  8.6* 8.8* 8.5*  --   MG 2.1  --   --  1.8   Liver Function Tests: Recent Labs  Lab 07/28/24 1448 07/29/24 1519  AST 19 23  ALT 15 17  ALKPHOS 211* 228*  BILITOT 1.7* 1.7*  PROT 7.3 7.8  ALBUMIN 3.8 3.8   CBG: No results for input(s): GLUCAP in the last 168 hours.  Discharge time spent: greater than 30 minutes.  Signed: Aimee Somerset, MD [facility] 07/30/2024

## 2024-07-30 NOTE — Telephone Encounter (Signed)
 The pharmacist was trying to see which 1 is better for him and cost is less cost.  The patient did not know anything about that they just went to [facility] to pick it up and that was the Dificid  and then when they got there they were told that they are not going to get it.  They were going to get vancomycin  but you do have to come over to the hospital pharmacy and I believe it is a no charge for the vancomycin .  That is what Bari at the pharmacy had told me.  Called back and spoke to [place] and he is on his way to get the vancomycin 

## 2024-07-30 NOTE — Telephone Encounter (Signed)
 Patient Product/process development scientist completed.    The patient is insured through [employer]. Patient has Medicare and is not eligible for a copay card, but may be able to apply for patient assistance or Medicare RX Payment Plan (Patient Must reach out to their plan, if eligible for payment plan), if available.    Ran test claim for vancomycin  125 mg and the current 10 day co-pay is $100.00.  Ran test claim for Dificid  200 mg and the current 10 day co-pay is $265.18.  This test claim was processed through [facility]- copay amounts may vary at other pharmacies due to pharmacy/plan contracts, or as the patient moves through the different stages of their insurance plan.     Reyes Sharps, CPHT Pharmacy Technician III Certified Patient Advocate [facility] Patient Advocate Team Direct Number: [phone]  Fax: [phone]

## 2024-07-30 NOTE — Progress Notes (Addendum)
 Progress Note   Patient: Chris George FMW:[id number] DOB: Mar 06, 1956 DOA: 07/29/2024     1 DOS: the patient was seen and examined on 07/30/2024   Brief hospital course:  Chris George is a 68 y.o. 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  He tested C. difficile antigen positive/toxin negative as well as enteroaggregative E. coli positive.  Patient has been having diarrhea for the past several weeks which has become more profuse over the course of the last few days .  He denies abdominal pain, vomiting, fever or chills.  He went into see his PCP today who did stool studies which tested positive for E. coli and subsequently C. difficile.  In the ED he was afebrile, BP soft at 92/56, pulse in the mid 50s. Labs notable for normal WBC, Stable hemoglobin of 8, stable thrombocytopenia of 38.  Creatinine at baseline at 2.63 but with bicarb of 18.  Stable elevation of alk phos to 228 and total bili to 1.7. CT abdomen and pelvis showing chronic findings related to kidney cirrhosis suspected hepatic metastasis prostatomegaly and possible focal cystitis. Patient was treated with an NS bolus and started on oral vancomycin  Admission requested.     Assessment and Plan:  * Chronic infectious diarrhea C. difficile antigen positive, toxin negative Enteroaggregated E. coli positive Patient with persistent diarrhea x 1 month now with abdominal pain Continue vancomycin  125 mg p.o. 4 times daily for C. difficile diarrhea x 10 days Oral hydration, gentle IV hydration given HFrEF Appreciate ID recommendation.   Recommends to treat patient for C. difficile diarrhea.  Supportive care for enteroaggregated E. 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 No bleeding reported Hemoglobin at baseline    CKD (chronic kidney disease) stage 4, GFR 15-29 ml/min (HCC) Metabolic acidosis, normal anion gap Renal function at baseline Acidosis likely related to diarrhea and dehydration Mild hydration in view of HFrEF     HFrEF (heart failure with reduced ejection fraction) (HCC) Idiopathic cardiomyopathy: Improved EF 40% --> 55% Clinically euvolemic to dry Monitor for fluid overload in view of hydration Holding Bumex  and spironolactone due to soft blood pressures Daily weights   Permanent atrial fibrillation (HCC) Chronic anticoagulation Patient with baseline bradycardia, not on rate control agents Continue Eliquis    Multiple myeloma (HCC) Colon cancer s/p hemicolectomy and chemotherapy History of prostate cancer s/p radiation therapy Thrombocytopenia and anemia, likely chemotherapy induced Was transfused 1 unit of packed RBC at the cancer center on 09/24 Monitor cell counts closely    09/25 -has only had 1 episode of diarrhea.  Denies having abdominal pain     Subjective: One episode of diarrhea.  No nausea or vomiting  Physical Exam: Vitals:   07/29/24 2225 07/29/24 2308 07/30/24 0414 07/30/24 0813  BP: (!) 95/57  (!) 96/58 (!) 93/49  Pulse: (!) 58  (!) 42 69  Resp: 16  16 18   Temp: (!) 97.5 F (36.4 C)  98 F (36.7 C) 97.7  F (36.5 C)  TempSrc: Oral  Oral Oral  SpO2: 100%  100% 99%  Weight:  79.9 kg    Height:  5' 9 (1.753 m)     Vitals and nursing note reviewed.  Constitutional:      General: He is not in acute distress. HENT:     Head: Normocephalic and atraumatic.  Cardiovascular:     Rate and  Rhythm: Regular rhythm. Bradycardia present.     Heart sounds: Normal heart sounds.  Pulmonary:     Effort: Pulmonary effort is normal.     Breath sounds: Normal breath sounds.  Abdominal:     Palpations: Abdomen is soft.     Tenderness: There is no abdominal tenderness.  Neurological:     Mental Status: Mental status is at baseline.    Data Reviewed: Sodium 136, potassium 4.4, chloride 110, bicarb 18, glucose 83, BUN 79, creatinine 2.25, hemoglobin 7.1 Labs reviewed  Family Communication: Plan of care discussed with patient in detail.  He verbalizes understanding and agrees with the plan.  Disposition: Status is: Inpatient Remains inpatient appropriate because: IV fluid hydration  Planned Discharge Destination: Home    Time spent: 50 minutes  Author: Aimee Somerset, MD 07/30/2024 2:31 PM  For on call review [email or web address].

## 2024-07-31 ENCOUNTER — Other Ambulatory Visit: Payer: Self-pay

## 2024-07-31 ENCOUNTER — Emergency Department

## 2024-07-31 ENCOUNTER — Telehealth: Payer: Self-pay | Admitting: Oncology

## 2024-07-31 ENCOUNTER — Inpatient Hospital Stay
Admission: EM | Admit: 2024-07-31 | Discharge: 2024-08-03 | DRG: 371 | Disposition: A | Discharge status: Home Health | Attending: Obstetrics and Gynecology | Admitting: Obstetrics and Gynecology

## 2024-07-31 ENCOUNTER — Telehealth: Payer: Self-pay

## 2024-07-31 ENCOUNTER — Encounter: Payer: Self-pay | Admitting: Emergency Medicine

## 2024-07-31 DIAGNOSIS — I130 Hypertensive heart and chronic kidney disease with heart failure and stage 1 through stage 4 chronic kidney disease, or unspecified chronic kidney disease: Secondary | ICD-10-CM | POA: Diagnosis present

## 2024-07-31 DIAGNOSIS — C184 Malignant neoplasm of transverse colon: Secondary | ICD-10-CM | POA: Diagnosis present

## 2024-07-31 DIAGNOSIS — I10 Essential (primary) hypertension: Secondary | ICD-10-CM | POA: Diagnosis not present

## 2024-07-31 DIAGNOSIS — K529 Noninfective gastroenteritis and colitis, unspecified: Secondary | ICD-10-CM | POA: Insufficient documentation

## 2024-07-31 DIAGNOSIS — Z5948 Other specified lack of adequate food: Secondary | ICD-10-CM

## 2024-07-31 DIAGNOSIS — D61810 Antineoplastic chemotherapy induced pancytopenia: Secondary | ICD-10-CM | POA: Diagnosis present

## 2024-07-31 DIAGNOSIS — K5520 Angiodysplasia of colon without hemorrhage: Secondary | ICD-10-CM | POA: Diagnosis present

## 2024-07-31 DIAGNOSIS — Z9049 Acquired absence of other specified parts of digestive tract: Secondary | ICD-10-CM

## 2024-07-31 DIAGNOSIS — E1140 Type 2 diabetes mellitus with diabetic neuropathy, unspecified: Secondary | ICD-10-CM | POA: Diagnosis present

## 2024-07-31 DIAGNOSIS — G4733 Obstructive sleep apnea (adult) (pediatric): Secondary | ICD-10-CM | POA: Diagnosis present

## 2024-07-31 DIAGNOSIS — R52 Pain, unspecified: Principal | ICD-10-CM

## 2024-07-31 DIAGNOSIS — M19071 Primary osteoarthritis, right ankle and foot: Secondary | ICD-10-CM | POA: Diagnosis present

## 2024-07-31 DIAGNOSIS — F32A Depression, unspecified: Secondary | ICD-10-CM | POA: Diagnosis present

## 2024-07-31 DIAGNOSIS — I4821 Permanent atrial fibrillation: Secondary | ICD-10-CM | POA: Diagnosis present

## 2024-07-31 DIAGNOSIS — E872 Acidosis, unspecified: Secondary | ICD-10-CM | POA: Diagnosis present

## 2024-07-31 DIAGNOSIS — K5521 Angiodysplasia of colon with hemorrhage: Secondary | ICD-10-CM | POA: Diagnosis present

## 2024-07-31 DIAGNOSIS — E7800 Pure hypercholesterolemia, unspecified: Secondary | ICD-10-CM | POA: Diagnosis present

## 2024-07-31 DIAGNOSIS — Z923 Personal history of irradiation: Secondary | ICD-10-CM

## 2024-07-31 DIAGNOSIS — I081 Rheumatic disorders of both mitral and tricuspid valves: Secondary | ICD-10-CM | POA: Diagnosis present

## 2024-07-31 DIAGNOSIS — E860 Dehydration: Secondary | ICD-10-CM | POA: Diagnosis present

## 2024-07-31 DIAGNOSIS — N184 Chronic kidney disease, stage 4 (severe): Secondary | ICD-10-CM | POA: Diagnosis present

## 2024-07-31 DIAGNOSIS — I959 Hypotension, unspecified: Secondary | ICD-10-CM | POA: Diagnosis not present

## 2024-07-31 DIAGNOSIS — Z8546 Personal history of malignant neoplasm of prostate: Secondary | ICD-10-CM

## 2024-07-31 DIAGNOSIS — I5020 Unspecified systolic (congestive) heart failure: Secondary | ICD-10-CM | POA: Diagnosis present

## 2024-07-31 DIAGNOSIS — C801 Malignant (primary) neoplasm, unspecified: Secondary | ICD-10-CM

## 2024-07-31 DIAGNOSIS — A0472 Enterocolitis due to Clostridium difficile, not specified as recurrent: Principal | ICD-10-CM | POA: Diagnosis present

## 2024-07-31 DIAGNOSIS — R262 Difficulty in walking, not elsewhere classified: Secondary | ICD-10-CM | POA: Diagnosis present

## 2024-07-31 DIAGNOSIS — Z5941 Food insecurity: Secondary | ICD-10-CM

## 2024-07-31 DIAGNOSIS — I429 Cardiomyopathy, unspecified: Secondary | ICD-10-CM | POA: Diagnosis present

## 2024-07-31 DIAGNOSIS — T451X5A Adverse effect of antineoplastic and immunosuppressive drugs, initial encounter: Secondary | ICD-10-CM | POA: Diagnosis present

## 2024-07-31 DIAGNOSIS — C787 Secondary malignant neoplasm of liver and intrahepatic bile duct: Secondary | ICD-10-CM | POA: Diagnosis present

## 2024-07-31 DIAGNOSIS — R001 Bradycardia, unspecified: Secondary | ICD-10-CM | POA: Diagnosis not present

## 2024-07-31 DIAGNOSIS — M19072 Primary osteoarthritis, left ankle and foot: Secondary | ICD-10-CM | POA: Diagnosis present

## 2024-07-31 DIAGNOSIS — K7460 Unspecified cirrhosis of liver: Secondary | ICD-10-CM | POA: Diagnosis present

## 2024-07-31 DIAGNOSIS — Z8673 Personal history of transient ischemic attack (TIA), and cerebral infarction without residual deficits: Secondary | ICD-10-CM

## 2024-07-31 DIAGNOSIS — Z85038 Personal history of other malignant neoplasm of large intestine: Secondary | ICD-10-CM

## 2024-07-31 DIAGNOSIS — Z7901 Long term (current) use of anticoagulants: Secondary | ICD-10-CM

## 2024-07-31 DIAGNOSIS — G620 Drug-induced polyneuropathy: Secondary | ICD-10-CM | POA: Diagnosis present

## 2024-07-31 DIAGNOSIS — E1122 Type 2 diabetes mellitus with diabetic chronic kidney disease: Secondary | ICD-10-CM | POA: Diagnosis present

## 2024-07-31 DIAGNOSIS — Z5986 Financial insecurity: Secondary | ICD-10-CM

## 2024-07-31 DIAGNOSIS — Z9221 Personal history of antineoplastic chemotherapy: Secondary | ICD-10-CM

## 2024-07-31 DIAGNOSIS — G629 Polyneuropathy, unspecified: Secondary | ICD-10-CM

## 2024-07-31 DIAGNOSIS — I5032 Chronic diastolic (congestive) heart failure: Secondary | ICD-10-CM | POA: Diagnosis present

## 2024-07-31 DIAGNOSIS — D6959 Other secondary thrombocytopenia: Secondary | ICD-10-CM | POA: Diagnosis present

## 2024-07-31 DIAGNOSIS — G63 Polyneuropathy in diseases classified elsewhere: Secondary | ICD-10-CM

## 2024-07-31 DIAGNOSIS — C9000 Multiple myeloma not having achieved remission: Secondary | ICD-10-CM | POA: Diagnosis present

## 2024-07-31 DIAGNOSIS — Z723 Lack of physical exercise: Secondary | ICD-10-CM

## 2024-07-31 DIAGNOSIS — E119 Type 2 diabetes mellitus without complications: Secondary | ICD-10-CM

## 2024-07-31 DIAGNOSIS — D61818 Other pancytopenia: Secondary | ICD-10-CM

## 2024-07-31 DIAGNOSIS — Z79899 Other long term (current) drug therapy: Secondary | ICD-10-CM

## 2024-07-31 LAB — CBC WITH DIFFERENTIAL/PLATELET
Abs Immature Granulocytes: 0.05 K/uL (ref 0.00–0.07)
Basophils Absolute: 0 K/uL (ref 0.0–0.1)
Basophils Relative: 1 %
Eosinophils Absolute: 0.1 K/uL (ref 0.0–0.5)
Eosinophils Relative: 2 %
HCT: 24.8 % — ABNORMAL LOW (ref 39.0–52.0)
Hemoglobin: 7.8 g/dL — ABNORMAL LOW (ref 13.0–17.0)
Immature Granulocytes: 1 %
Lymphocytes Relative: 9 %
Lymphs Abs: 0.3 K/uL — ABNORMAL LOW (ref 0.7–4.0)
MCH: 28.8 pg (ref 26.0–34.0)
MCHC: 31.5 g/dL (ref 30.0–36.0)
MCV: 91.5 fL (ref 80.0–100.0)
Monocytes Absolute: 0.6 K/uL (ref 0.1–1.0)
Monocytes Relative: 15 %
Neutro Abs: 2.7 K/uL (ref 1.7–7.7)
Neutrophils Relative %: 72 %
Platelets: 47 K/uL — ABNORMAL LOW (ref 150–400)
RBC: 2.71 MIL/uL — ABNORMAL LOW (ref 4.22–5.81)
RDW: 19.2 % — ABNORMAL HIGH (ref 11.5–15.5)
Smear Review: NORMAL
WBC: 3.7 K/uL — ABNORMAL LOW (ref 4.0–10.5)
nRBC: 0 % (ref 0.0–0.2)

## 2024-07-31 LAB — COMPREHENSIVE METABOLIC PANEL WITH GFR
ALT: 15 U/L (ref 0–44)
AST: 22 U/L (ref 15–41)
Albumin: 3.6 g/dL (ref 3.5–5.0)
Alkaline Phosphatase: 195 U/L — ABNORMAL HIGH (ref 38–126)
Anion gap: 11 (ref 5–15)
BUN: 71 mg/dL — ABNORMAL HIGH (ref 8–23)
CO2: 16 mmol/L — ABNORMAL LOW (ref 22–32)
Calcium: 9.3 mg/dL (ref 8.9–10.3)
Chloride: 110 mmol/L (ref 98–111)
Creatinine, Ser: 2.62 mg/dL — ABNORMAL HIGH (ref 0.61–1.24)
GFR, Estimated: 26 mL/min — ABNORMAL LOW (ref >60)
Glucose, Bld: 98 mg/dL (ref 70–99)
Potassium: 4.5 mmol/L (ref 3.5–5.1)
Sodium: 137 mmol/L (ref 135–145)
Total Bilirubin: 1.7 mg/dL — ABNORMAL HIGH (ref 0.0–1.2)
Total Protein: 7.8 g/dL (ref 6.5–8.1)

## 2024-07-31 MED ORDER — HYDROMORPHONE HCL 1 MG/ML IJ SOLN
1.0000 mg | Freq: Once | INTRAMUSCULAR | Status: AC
  Administered 2024-07-31: 1 mg via INTRAVENOUS
  Filled 2024-07-31: qty 1

## 2024-07-31 MED ORDER — FENTANYL CITRATE PF 50 MCG/ML IJ SOSY
50.0000 ug | PREFILLED_SYRINGE | Freq: Once | INTRAMUSCULAR | Status: AC
  Administered 2024-07-31: 50 ug via INTRAVENOUS
  Filled 2024-07-31: qty 1

## 2024-07-31 MED ORDER — MIRABEGRON ER 25 MG PO TB24
25.0000 mg | ORAL_TABLET | Freq: Every day | ORAL | Status: DC
  Administered 2024-08-01 – 2024-08-02 (×2): 25 mg via ORAL
  Filled 2024-07-31 (×3): qty 1

## 2024-07-31 MED ORDER — APIXABAN 5 MG PO TABS
5.0000 mg | ORAL_TABLET | Freq: Two times a day (BID) | ORAL | Status: DC
  Administered 2024-07-31 – 2024-08-02 (×5): 5 mg via ORAL
  Filled 2024-07-31 (×5): qty 1

## 2024-07-31 MED ORDER — MAGNESIUM SULFATE 2 GM/50ML IV SOLN
2.0000 g | Freq: Once | INTRAVENOUS | Status: AC
  Administered 2024-07-31: 2 g via INTRAVENOUS
  Filled 2024-07-31: qty 50

## 2024-07-31 MED ORDER — LACTATED RINGERS IV SOLN: INTRAVENOUS | Status: DC

## 2024-07-31 MED ORDER — TAMSULOSIN HCL 0.4 MG PO CAPS
0.4000 mg | ORAL_CAPSULE | Freq: Every day | ORAL | Status: DC
  Administered 2024-07-31 – 2024-08-02 (×3): 0.4 mg via ORAL
  Filled 2024-07-31 (×3): qty 1

## 2024-07-31 MED ORDER — GABAPENTIN 300 MG PO CAPS
300.0000 mg | ORAL_CAPSULE | Freq: Two times a day (BID) | ORAL | Status: DC
  Administered 2024-08-01: 300 mg via ORAL
  Filled 2024-07-31: qty 1

## 2024-07-31 MED ORDER — GABAPENTIN 300 MG PO CAPS
300.0000 mg | ORAL_CAPSULE | Freq: Once | ORAL | Status: AC
  Administered 2024-07-31: 300 mg via ORAL
  Filled 2024-07-31: qty 1

## 2024-07-31 MED ORDER — CALCITRIOL 0.25 MCG PO CAPS
0.2500 ug | ORAL_CAPSULE | Freq: Every day | ORAL | Status: DC
  Administered 2024-08-01 – 2024-08-02 (×2): 0.25 ug via ORAL
  Filled 2024-07-31 (×2): qty 1

## 2024-07-31 MED ORDER — VANCOMYCIN HCL 125 MG PO CAPS
125.0000 mg | ORAL_CAPSULE | Freq: Four times a day (QID) | ORAL | Status: DC
Start: 2024-08-01 — End: 2024-08-01
  Administered 2024-07-31 – 2024-08-01 (×2): 125 mg via ORAL
  Filled 2024-07-31 (×3): qty 1

## 2024-07-31 MED ORDER — FIDAXOMICIN 200 MG PO TABS
200.0000 mg | ORAL_TABLET | Freq: Two times a day (BID) | ORAL | Status: DC
  Administered 2024-07-31 – 2024-08-02 (×5): 200 mg via ORAL
  Filled 2024-07-31 (×6): qty 1

## 2024-07-31 MED ORDER — ONDANSETRON HCL 4 MG/2ML IJ SOLN
4.0000 mg | Freq: Once | INTRAMUSCULAR | Status: AC
  Administered 2024-07-31: 4 mg via INTRAVENOUS
  Filled 2024-07-31: qty 2

## 2024-07-31 MED ORDER — ATORVASTATIN CALCIUM 20 MG PO TABS
80.0000 mg | ORAL_TABLET | Freq: Every day | ORAL | Status: DC
  Administered 2024-08-01 – 2024-08-02 (×2): 80 mg via ORAL
  Filled 2024-07-31 (×2): qty 4

## 2024-07-31 MED ORDER — SODIUM CHLORIDE 0.9 % IV BOLUS
1000.0000 mL | Freq: Once | INTRAVENOUS | Status: AC
  Administered 2024-07-31: 1000 mL via INTRAVENOUS

## 2024-07-31 NOTE — ED Provider Notes (Signed)
 [facility] Provider Note    Event Date/Time   First MD Initiated Contact with Patient 07/31/24 1407     (approximate)   History   Leg Pain   HPI  Chris George is a 68 y.o. male history of multiple myeloma, colon cancer, prostate cancer, and now liver cancer presents emergency department with severe bilateral foot pain.  Patient states so severe he cannot stand.  States has a history of neuropathy and now the foot is numb but very painful when he goes to stand.  States he cannot take care of himself at home because he cannot stand and walk to the kitchen.  Has not been on anything like gabapentin  before.  Was seen here 2 days ago for C. difficile.  His regular oncologist is Dr. Jacobo      Physical Exam   Triage Vital Signs: ED Triage Vitals  Encounter Vitals Group     BP 07/31/24 1239 (!) 92/57     Girls Systolic BP Percentile --      Girls Diastolic BP Percentile --      Boys Systolic BP Percentile --      Boys Diastolic BP Percentile --      Pulse Rate 07/31/24 1239 60     Resp 07/31/24 1239 16     Temp 07/31/24 1239 98 F (36.7 C)     Temp Source 07/31/24 1239 Oral     SpO2 07/31/24 1236 100 %     Weight 07/31/24 1238 176 lb 5.9 oz (80 kg)     Height 07/31/24 1238 5' 9 (1.753 m)     Head Circumference --      Peak Flow --      Pain Score 07/31/24 1352 9     Pain Loc --      Pain Education --      Exclude from Growth Chart --     Most recent vital signs: Vitals:   07/31/24 1645 07/31/24 1847  BP: 104/61   Pulse: (!) 53   Resp: 18   Temp:  97.8 F (36.6 C)  SpO2: 100%      General: Awake, no distress.   CV:  Good peripheral perfusion.  Resp:  Normal effort.  Abd:  No distention.   Other:     ED Results / Procedures / Treatments   Labs (all labs ordered are listed, but only abnormal results are displayed) Labs Reviewed  COMPREHENSIVE METABOLIC PANEL WITH GFR - Abnormal; Notable for the following components:       Result Value   CO2 16 (*)    BUN 71 (*)    Creatinine, Ser 2.62 (*)    Alkaline Phosphatase 195 (*)    Total Bilirubin 1.7 (*)    GFR, Estimated 26 (*)    All other components within normal limits  CBC WITH DIFFERENTIAL/PLATELET - Abnormal; Notable for the following components:   WBC 3.7 (*)    RBC 2.71 (*)    Hemoglobin 7.8 (*)    HCT 24.8 (*)    RDW 19.2 (*)    Platelets 47 (*)    Lymphs Abs 0.3 (*)    All other components within normal limits     EKG     RADIOLOGY X-ray of the right and left foot    PROCEDURES:   Procedures  Critical Care:  no Chief Complaint  Patient presents with   Leg Pain      MEDICATIONS ORDERED IN ED:  Medications  HYDROmorphone  (DILAUDID ) injection 1 mg (1 mg Intravenous Given 07/31/24 1524)  ondansetron  (ZOFRAN ) injection 4 mg (4 mg Intravenous Given 07/31/24 1521)  gabapentin  (NEURONTIN ) capsule 300 mg (300 mg Oral Given 07/31/24 1514)  sodium chloride  0.9 % bolus 1,000 mL (0 mLs Intravenous Stopped 07/31/24 1847)  fentaNYL  (SUBLIMAZE ) injection 50 mcg (50 mcg Intravenous Given 07/31/24 1845)     IMPRESSION / MDM / ASSESSMENT AND PLAN / ED COURSE  I reviewed the triage vital signs and the nursing notes.                              Differential diagnosis includes, but is not limited to, chronic pain, osteomyelitis, fracture, infection, neuropathy  Patient's presentation is most consistent with acute illness / injury with system symptoms.    Medications given: Patient was given Dilaudid  1 mg IV, Zofran  4 mg IV, gabapentin  300 mg p.o., and fentanyl  50 mcg IV  Patient continued to have pain.  Still cannot stand secondary to discomfort and pain.  Due to him having cancer, severe neuropathy in his feet and unable to stand that we should admit for pain control.  Will consult hospitalist.   Hospitalist agreeable for admission.  Patient notified.  He is in stable condition at this time   FINAL CLINICAL IMPRESSION(S) / ED DIAGNOSES    Final diagnoses:  Acute pain  Neuropathy associated with cancer (HCC)  Unable to walk     Rx / DC Orders   ED Discharge Orders     None        Note:  This document was prepared using Dragon voice recognition software and may include unintentional dictation errors.    Gasper Devere ORN, PA-C 07/31/24 [name], MD 08/01/24 [phone]

## 2024-07-31 NOTE — Telephone Encounter (Signed)
 Patient called he said early in the morning and no one's called him and I told him that I did not get anything with his name on it until 1047 or something like that.  He already told me that he was in the ER and he wanted Dr. Georgina again to do something and for what he thinks is neuropathy because he can hardly get around and sometimes cannot do anything and he does a crutch because he had 1 already at his house.  Thinks maybe increase the neuropathy medicine.

## 2024-07-31 NOTE — ED Notes (Signed)
 Dr Posey notified and is aware that EKG is done and in the chart

## 2024-07-31 NOTE — ED Notes (Addendum)
 PA Fisher notified of pts BP of 99/56 and pulse of 49. PA Fisher ordered fluids

## 2024-07-31 NOTE — ED Triage Notes (Signed)
 Pt arrives via EMS with reports of worsening bilateral leg pain over past 5 days. States he is unable to walk now due to pain. Hx of neuropathy.  Pt just discharged yesterday for cdiff.

## 2024-07-31 NOTE — ED Notes (Signed)
 Fall risk bracelet on, bed alarm on, and pt is wearing nonslip shoes

## 2024-07-31 NOTE — Telephone Encounter (Signed)
 Pt called and requested return call from clinical team - notified them - [facility]

## 2024-07-31 NOTE — Telephone Encounter (Signed)
 Just tried to call patient in regards to patient asking someone from the ED to reach out to us , but I got no answer or able to leave a vm.

## 2024-07-31 NOTE — H&P (Addendum)
 History and Physical    Patient: Chris George FMW:[id number] DOB: 03-Jul-1956 DOA: 07/31/2024 DOS: the patient was seen and examined on 07/31/2024 PCP: Jeffie Cheryl FORBES, MD  Patient coming from: Home  Chief Complaint:  Chief Complaint  Patient presents with   Leg Pain   HPI: Chris George is a 68 y.o. 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  He endorsed history of peripheral neuropathy, but now complaining of both feet being numb and with severe pain on standing and walking (even to the kitchen) from the living room.  Patient was recently admitted from 9/24 to 9/25 due to chronic infectious diarrhea in which case C. difficile that was done as an outpatient was noted to be positive for enteroaggregated E. coli.  He also had hypotension and IV fluid was provided.  Patient was discharged with Dificid  on vancomycin .  Since patient was unable to take care of of himself due to the pain and difficulty been able to stand, he returned to the ED for further evaluation.  He denied fever, chills, chest pain, shortness of breath, falls.  ED Course:  In the emergency department, BP was soft at 92/57, other vital signs were within normal range.  Workup in the ED showed WBC 3.7, H/H 7.8/24.8 (hemoglobin was 7.1 on discharge yesterday).  Platelets 47 (this was 33 when discharged yesterday).  BMP sodium 137, potassium 4.5, chloride 110, bicarb 16, blood glucose 98, BUN 71, creatinine 2.62 (creatinine  is within baseline range).  ALP 195 (was elevated at 228 about 2 days ago), eGFR 26.  Magnesium  1.8. Left and right foot x-ray showed no acute fracture or dislocation Pain medication with fentanyl  and Dilaudid  was given, patient was treated with gabapentin , Zofran  was given and IV hydration was provided.  TRH was asked to admit patient.  Review of Systems: Review of systems as noted in the HPI. All other systems reviewed and are negative.   Past Medical History:  Diagnosis Date   A-fib (HCC)    Anemia    Arthritis    ankles   Cancer (HCC)    multiple myeloma   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HCC) 10/26/2019   Cardiomyopathy (HCC)    Chemotherapy-induced peripheral neuropathy    CHF (congestive heart failure) (HCC)    Depression    Dysrhythmia    atrial fib   History of CVA (cerebrovascular accident)    HLD (hyperlipidemia)    Hypercholesteremia    Hypertension    Moderate tricuspid insufficiency    Multiple myeloma (HCC)    not being treated right now per pt 11/11/19   Pneumonia    Sleep apnea    No CPAP.  OSA resolved with wt loss.   Past Surgical History:  Procedure Laterality Date   ATRIAL ABLATION SURGERY     CARDIAC SURGERY     A-Fib Ablations   COLONOSCOPY N/A 03/17/2024   Procedure: COLONOSCOPY;  Surgeon: Jinny Carmine, MD;  Location: [facility] ENDOSCOPY;  Service: Endoscopy;  Laterality: N/A;  COLONOSCOPY WITH PROPOFOL  N/A 10/26/2019   Procedure: COLONOSCOPY WITH BIOPSY;  Surgeon: Jinny Carmine, MD;  Location: [facility]GERY [facility];  Service: Endoscopy;  Laterality: N/A;   COLONOSCOPY WITH PROPOFOL  N/A 09/03/2022   Procedure: COLONOSCOPY WITH PROPOFOL ;  Surgeon: Jinny Carmine, MD;  Location: [facility] SURGERY [facility];  Service: Endoscopy;  Laterality: N/A;   ESOPHAGOGASTRODUODENOSCOPY N/A 03/17/2024   Procedure: GI Bleeding;  Surgeon: Jinny Carmine, MD;  Location: [facility] ENDOSCOPY;  Service: Endoscopy;  Laterality: N/A;  GI Bleeding, EGD   ESOPHAGOGASTRODUODENOSCOPY (EGD) WITH PROPOFOL   N/A 10/26/2019   Procedure: ESOPHAGOGASTRODUODENOSCOPY (EGD) WITH BIOPSY;  Surgeon: Jinny Carmine, MD;  Location: [facility] SURGERY CNTR;  Service: Endoscopy;  Laterality: N/A;  sleep apnea   LAPAROSCOPIC PARTIAL COLECTOMY Right 11/17/2019   Procedure: LAPAROSCOPIC PARTIAL COLECTOMY RIGHT EXTENDED;  Surgeon: Desiderio Schanz, MD;  Location: [facility] ORS;  Service: General;  Laterality: Right;   PORTACATH PLACEMENT N/A 12/28/2019   Procedure: INSERTION PORT-A-CATH Left subclavian;  Surgeon: Desiderio Schanz, MD;  Location: [facility] ORS;  Service: General;  Laterality: N/A;    Social History:  reports that he has never smoked. He has never been exposed to tobacco smoke. He has never used smokeless tobacco. He reports that he does not currently use alcohol. He reports that he does not use drugs.   No Known Allergies  Family History  Problem Relation Age of Onset   Healthy Mother      Prior to Admission medications   Medication Sig Start Date End Date Taking? Authorizing Provider  apixaban  (ELIQUIS ) 5 MG TABS tablet Take 5 mg by mouth 2 (two) times daily.    [provider]  atorvastatin  (LIPITOR) 80 MG tablet Take 80 mg by mouth daily.    [provider]  bumetanide  (BUMEX ) 2 MG tablet Take 1.5 mg by mouth daily. Taking one in the morning. 12/17/23 12/11/24  [provider]  calcitRIOL  (ROCALTROL ) 0.25 MCG capsule Take 0.25 mcg by mouth daily. 06/22/24 06/22/25  [provider]  colchicine  0.6 MG tablet Take 1 tablet (0.6 mg total) by mouth daily as needed. Until flare resolves. 03/17/24   Jens Durand, MD  fidaxomicin  (DIFICID ) 200 MG TABS tablet Take 1 tablet (200 mg total) by mouth 2 (two) times daily for 10 days. 07/30/24 08/09/24  Lanetta Lingo, MD  hydrocortisone  (ANUSOL -HC) 2.5 % rectal cream Place 1 Application rectally 2 (two) times daily. 05/21/24   Brimage, Vondra, DO  lidocaine -prilocaine  (EMLA ) cream Apply 1 Application topically daily.    [provider]   magnesium  chloride (SLOW-MAG) 64 MG TBEC SR tablet Take 1 tablet (64 mg total) by mouth daily. 11/08/23   Borders, Fonda SAUNDERS, NP  ondansetron  (ZOFRAN ) 8 MG tablet Take 1 tablet (8 mg total) by mouth every 8 (eight) hours as needed for nausea or vomiting. 05/05/24   [name], MD  oxyCODONE -acetaminophen  (PERCOCET/ROXICET) 5-325 MG tablet Take 1-2 tablets by mouth every 6 (six) hours as needed for severe pain (pain score 7-10). 04/29/24   [name], MD  phenylephrine -shark liver oil-mineral oil-petrolatum (HEMORRHOIDAL) 0.25-14-74.9 % rectal ointment Place 1 Application rectally 2 (two) times daily as needed for hemorrhoids. 07/30/24 08/29/24  Lanetta Lingo, MD  prochlorperazine  (COMPAZINE ) 10 MG tablet Take 1 tablet (10 mg total) by mouth every 6 (six) hours as needed for nausea or vomiting. 05/05/24   Finnegan, Timothy J, MD  spironolactone (ALDACTONE) 25 MG tablet Take 1 tablet by mouth every morning. 12/17/23 12/11/24  [provider]  tamsulosin  (FLOMAX ) 0.4 MG CAPS  capsule Take 1 capsule (0.4 mg total) by mouth daily after supper. 02/04/24   Lenn Aran, MD  vancomycin  (VANCOCIN ) 125 MG capsule Take 1 capsule (125 mg total) by mouth 4 (four) times daily for 10 days. 07/30/24 08/09/24  Lanetta Lingo, MD  Vibegron  (GEMTESA ) 75 MG TABS Take 1 tablet (75 mg total) by mouth daily for 28 days. 07/17/24 08/14/24  Francisca Redell BROCKS, MD    Physical Exam: BP 97/71   Pulse (!) 53   Temp 97.8 F (36.6 C) (Oral)   Resp 17   Ht 5' 9 (1.753 m)   Wt 80 kg   SpO2 100%   BMI 26.05 kg/m   General: 68 y.o. year-old male well developed well nourished in no acute distress.  Alert and oriented x3. HEENT: NCAT, EOMI, dry mucous membrane Neck: Supple, trachea medial Cardiovascular: Regular rate and rhythm with no rubs or gallops.  No thyromegaly or JVD noted.  Bilateral trace edema in both feet. 2/4 pulses in all 4 extremities. Respiratory: Clear to auscultation with no wheezes or  rales. Good inspiratory effort. Abdomen: Soft, nontender nondistended with normal bowel sounds x4 quadrants. Muskuloskeletal: No cyanosis, clubbing noted bilaterally Neuro: CN II-XII intact, sensation, reflexes intact Skin: No ulcerative lesions noted or rashes Psychiatry: Mood is appropriate for condition and setting          Labs on Admission:  Basic Metabolic Panel: Recent Labs  Lab 07/28/24 1448 07/29/24 1519 07/30/24 0553 07/30/24 1112 07/31/24 1510  NA 130* 131* 136  --  137  K 4.5 4.3 4.4  --  4.5  CL 104 104 110  --  110  CO2 17* 18* 18*  --  16*  GLUCOSE 98 91 83  --  98  BUN 88* 87* 79*  --  71*  CREATININE 2.35* 2.63* 2.25*  --  2.62*  CALCIUM  8.6* 8.8* 8.5*  --  9.3  MG 2.1  --   --  1.8  --    Liver Function Tests: Recent Labs  Lab 07/28/24 1448 07/29/24 1519 07/31/24 1510  AST 19 23 22   ALT 15 17 15   ALKPHOS 211* 228* 195*  BILITOT 1.7* 1.7* 1.7*  PROT 7.3 7.8 7.8  ALBUMIN 3.8 3.8 3.6   Recent Labs  Lab 07/29/24 1721  LIPASE 23   No results for input(s): AMMONIA in the last 168 hours. CBC: Recent Labs  Lab 07/28/24 1448 07/29/24 1519 07/30/24 0553 07/31/24 1510  WBC 4.1 5.0 3.6* 3.7*  NEUTROABS 3.5 4.0  --  2.7  HGB 7.2* 8.0* 7.1* 7.8*  HCT 22.3* 24.7* 21.6* 24.8*  MCV 89.2 90.1 89.3 91.5  PLT 41* 38* 33* 47*   Cardiac Enzymes: No results for input(s): CKTOTAL, CKMB, CKMBINDEX, TROPONINI in the last 168 hours.  BNP (last 3 results) Recent Labs    10/23/23 1732  BNP 164.5*    ProBNP (last 3 results) No results for input(s): PROBNP in the last 8760 hours.  CBG: No results for input(s): GLUCAP in the last 168 hours.  Radiological Exams on Admission: DG Foot Complete Left Result Date: 07/31/2024 CLINICAL DATA:  pain EXAM: LEFT FOOT - COMPLETE 3+ VIEW COMPARISON:  None Available. FINDINGS: No acute fracture or dislocation. There is no evidence of arthropathy or other focal bone abnormality. Soft tissues are  unremarkable. No radiopaque foreign body. IMPRESSION: No acute fracture or dislocation. Electronically Signed   By: Rogelia Myers M.D.   On: 07/31/2024 15:19   DG Foot Complete Right Result  Date: 07/31/2024 CLINICAL DATA:  pain EXAM: RIGHT FOOT COMPLETE - 3+ VIEW COMPARISON:  None Available. FINDINGS: No acute fracture or dislocation. There is no evidence of arthropathy or other focal bone abnormality. Soft tissues are unremarkable. No radiopaque foreign body. IMPRESSION: No acute fracture or dislocation. Electronically Signed   By: Rogelia Myers M.D.   On: 07/31/2024 15:18    EKG: I independently viewed the EKG done and my findings are as followed: Wide QRS rhythm with rate of 44 bpm and RBBB (When compared with ECG of 23-Oct-2023 19:55, Wide QRS rhythm has replaced Atrial fibrillation)  Assessment/Plan Present on Admission:  Multiple myeloma (HCC)  Hypertension, essential, benign  Permanent atrial fibrillation (HCC)  HFrEF (heart failure with reduced ejection fraction) (HCC)  CKD (chronic kidney disease) stage 4, GFR 15-29 ml/min (HCC)  Principal Problem:   Peripheral neuropathy Active Problems:   Hypertension, essential, benign   HFrEF (heart failure with reduced ejection fraction) (HCC)   CKD (chronic kidney disease) stage 4, GFR 15-29 ml/min (HCC)   Permanent atrial fibrillation (HCC)   Multiple myeloma (HCC)   Chronic diarrhea  Peripheral neuropathy Ambulatory difficulty Patient endorsed mild and tolerable peripheral neuropathy to both feet within the last few years and did not require any therapy.  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 He complained of suddenly worsened bilateral feet pain over the past 24 hours Pain medication was given in the ED He endorsed improvement after gabapentin  Continue gabapentin  300 mg twice daily Continue PT/OT eval and treat  Chronic infectious diarrhea C. difficile antigen positive, toxin negative Enteroaggregated E. 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 Acidosis may be due to dehydration and patient's history of diarrhea IV hydration was provided in the ED Renally adjust medications, avoid nephrotoxic agents/dehydration/hypotension   HFrEF (heart failure with reduced ejection fraction) (HCC) Idiopathic cardiomyopathy: Improved EF 40% --> 55% (per medical record) Clinically euvolemic to dry Continue total input/output, daily weights and fluid restriction   Permanent atrial fibrillation (HCC) Chronic anticoagulation Patient has baseline bradycardia, not on rate control agents Continue Eliquis  Monitor closely for bleeding while on Eliquis  due to thrombocytopenia   Multiple myeloma  Colon cancer s/p hemicolectomy and chemotherapy History of prostate cancer s/p radiation therapy Stable, continue home meds  History of CVA Continue Eliquis , statin     DVT prophylaxis: Eliquis   Code Status: Full code  Family Communication: None at bedside  Consults: None  Severity of Illness: The appropriate patient status for this patient is OBSERVATION. Observation status is judged to be reasonable and necessary in order to provide the required intensity of service to ensure the patient's safety. The patient's presenting symptoms, physical exam findings, and initial radiographic and laboratory data in the context of their medical condition is felt to place them at decreased risk  for further clinical deterioration. Furthermore, it is anticipated that the patient will be medically stable for discharge from the hospital within 2 midnights of admission.   Author: Absalom Aro, DO 07/31/2024 8:08 PM  For on call review [email or web address].

## 2024-08-01 ENCOUNTER — Inpatient Hospital Stay

## 2024-08-01 ENCOUNTER — Observation Stay

## 2024-08-01 DIAGNOSIS — E1140 Type 2 diabetes mellitus with diabetic neuropathy, unspecified: Secondary | ICD-10-CM | POA: Diagnosis present

## 2024-08-01 DIAGNOSIS — D6959 Other secondary thrombocytopenia: Secondary | ICD-10-CM | POA: Diagnosis present

## 2024-08-01 DIAGNOSIS — M19071 Primary osteoarthritis, right ankle and foot: Secondary | ICD-10-CM | POA: Diagnosis present

## 2024-08-01 DIAGNOSIS — N184 Chronic kidney disease, stage 4 (severe): Secondary | ICD-10-CM | POA: Diagnosis present

## 2024-08-01 DIAGNOSIS — I4821 Permanent atrial fibrillation: Secondary | ICD-10-CM | POA: Diagnosis present

## 2024-08-01 DIAGNOSIS — E860 Dehydration: Secondary | ICD-10-CM | POA: Diagnosis present

## 2024-08-01 DIAGNOSIS — E1122 Type 2 diabetes mellitus with diabetic chronic kidney disease: Secondary | ICD-10-CM | POA: Diagnosis present

## 2024-08-01 DIAGNOSIS — I429 Cardiomyopathy, unspecified: Secondary | ICD-10-CM | POA: Diagnosis present

## 2024-08-01 DIAGNOSIS — G6289 Other specified polyneuropathies: Secondary | ICD-10-CM | POA: Diagnosis not present

## 2024-08-01 DIAGNOSIS — C9000 Multiple myeloma not having achieved remission: Secondary | ICD-10-CM | POA: Diagnosis present

## 2024-08-01 DIAGNOSIS — K7460 Unspecified cirrhosis of liver: Secondary | ICD-10-CM | POA: Diagnosis present

## 2024-08-01 DIAGNOSIS — G4733 Obstructive sleep apnea (adult) (pediatric): Secondary | ICD-10-CM | POA: Diagnosis present

## 2024-08-01 DIAGNOSIS — D61810 Antineoplastic chemotherapy induced pancytopenia: Secondary | ICD-10-CM | POA: Diagnosis present

## 2024-08-01 DIAGNOSIS — F32A Depression, unspecified: Secondary | ICD-10-CM | POA: Diagnosis present

## 2024-08-01 DIAGNOSIS — E7800 Pure hypercholesterolemia, unspecified: Secondary | ICD-10-CM | POA: Diagnosis present

## 2024-08-01 DIAGNOSIS — A0472 Enterocolitis due to Clostridium difficile, not specified as recurrent: Secondary | ICD-10-CM | POA: Diagnosis present

## 2024-08-01 DIAGNOSIS — G629 Polyneuropathy, unspecified: Secondary | ICD-10-CM | POA: Diagnosis present

## 2024-08-01 DIAGNOSIS — E872 Acidosis, unspecified: Secondary | ICD-10-CM | POA: Diagnosis present

## 2024-08-01 DIAGNOSIS — I5032 Chronic diastolic (congestive) heart failure: Secondary | ICD-10-CM | POA: Diagnosis present

## 2024-08-01 DIAGNOSIS — I959 Hypotension, unspecified: Secondary | ICD-10-CM | POA: Diagnosis not present

## 2024-08-01 DIAGNOSIS — I130 Hypertensive heart and chronic kidney disease with heart failure and stage 1 through stage 4 chronic kidney disease, or unspecified chronic kidney disease: Secondary | ICD-10-CM | POA: Diagnosis present

## 2024-08-01 DIAGNOSIS — C184 Malignant neoplasm of transverse colon: Secondary | ICD-10-CM | POA: Diagnosis present

## 2024-08-01 DIAGNOSIS — M19072 Primary osteoarthritis, left ankle and foot: Secondary | ICD-10-CM | POA: Diagnosis present

## 2024-08-01 DIAGNOSIS — G620 Drug-induced polyneuropathy: Secondary | ICD-10-CM | POA: Diagnosis present

## 2024-08-01 DIAGNOSIS — C787 Secondary malignant neoplasm of liver and intrahepatic bile duct: Secondary | ICD-10-CM | POA: Diagnosis present

## 2024-08-01 DIAGNOSIS — I081 Rheumatic disorders of both mitral and tricuspid valves: Secondary | ICD-10-CM | POA: Diagnosis present

## 2024-08-01 LAB — COMPREHENSIVE METABOLIC PANEL WITH GFR
ALT: 14 U/L (ref 0–44)
AST: 15 U/L (ref 15–41)
Albumin: 3.3 g/dL — ABNORMAL LOW (ref 3.5–5.0)
Alkaline Phosphatase: 172 U/L — ABNORMAL HIGH (ref 38–126)
Anion gap: 7 (ref 5–15)
BUN: 71 mg/dL — ABNORMAL HIGH (ref 8–23)
CO2: 16 mmol/L — ABNORMAL LOW (ref 22–32)
Calcium: 8.8 mg/dL — ABNORMAL LOW (ref 8.9–10.3)
Chloride: 111 mmol/L (ref 98–111)
Creatinine, Ser: 2.83 mg/dL — ABNORMAL HIGH (ref 0.61–1.24)
GFR, Estimated: 24 mL/min — ABNORMAL LOW (ref >60)
Glucose, Bld: 81 mg/dL (ref 70–99)
Potassium: 4.2 mmol/L (ref 3.5–5.1)
Sodium: 134 mmol/L — ABNORMAL LOW (ref 135–145)
Total Bilirubin: 1.4 mg/dL — ABNORMAL HIGH (ref 0.0–1.2)
Total Protein: 6.8 g/dL (ref 6.5–8.1)

## 2024-08-01 LAB — CBC
HCT: 20.4 % — ABNORMAL LOW (ref 39.0–52.0)
Hemoglobin: 6.6 g/dL — ABNORMAL LOW (ref 13.0–17.0)
MCH: 28.8 pg (ref 26.0–34.0)
MCHC: 32.4 g/dL (ref 30.0–36.0)
MCV: 89.1 fL (ref 80.0–100.0)
Platelets: 39 K/uL — ABNORMAL LOW (ref 150–400)
RBC: 2.29 MIL/uL — ABNORMAL LOW (ref 4.22–5.81)
RDW: 19.1 % — ABNORMAL HIGH (ref 11.5–15.5)
WBC: 2.8 K/uL — ABNORMAL LOW (ref 4.0–10.5)
nRBC: 0 % (ref 0.0–0.2)

## 2024-08-01 LAB — MAGNESIUM: Magnesium: 2.2 mg/dL (ref 1.7–2.4)

## 2024-08-01 LAB — IRON AND TIBC
Iron: 27 ug/dL — ABNORMAL LOW (ref 45–182)
Saturation Ratios: 12 % — ABNORMAL LOW (ref 17.9–39.5)
TIBC: 225 ug/dL — ABNORMAL LOW (ref 250–450)
UIBC: 198 ug/dL

## 2024-08-01 LAB — FERRITIN: Ferritin: 1107 ng/mL — ABNORMAL HIGH (ref 24–336)

## 2024-08-01 LAB — PHOSPHORUS: Phosphorus: 2.9 mg/dL (ref 2.5–4.6)

## 2024-08-01 MED ORDER — GABAPENTIN 100 MG PO CAPS: 200.0000 mg | ORAL_CAPSULE | Freq: Two times a day (BID) | ORAL | Status: DC

## 2024-08-01 MED ORDER — SODIUM CHLORIDE 0.9% FLUSH
10.0000 mL | INTRAVENOUS | Status: DC | PRN
  Administered 2024-08-02: 10 mL

## 2024-08-01 MED ORDER — GABAPENTIN 100 MG PO CAPS
200.0000 mg | ORAL_CAPSULE | Freq: Two times a day (BID) | ORAL | Status: DC
  Administered 2024-08-01 – 2024-08-02 (×3): 200 mg via ORAL
  Filled 2024-08-01 (×3): qty 2

## 2024-08-01 MED ORDER — ORAL CARE MOUTH RINSE: 15.0000 mL | OROMUCOSAL | Status: DC | PRN

## 2024-08-01 MED ORDER — SODIUM CHLORIDE 0.9% IV SOLUTION: Freq: Once | INTRAVENOUS | Status: AC

## 2024-08-01 MED ORDER — CHLORHEXIDINE GLUCONATE CLOTH 2 % EX PADS
6.0000 | MEDICATED_PAD | Freq: Every day | CUTANEOUS | Status: DC
  Administered 2024-08-01 – 2024-08-02 (×2): 6 via TOPICAL

## 2024-08-01 MED ORDER — DEXTROSE 5 % IV SOLN
INTRAVENOUS | Status: DC
  Filled 2024-08-01: qty 1000
  Filled 2024-08-01 (×2): qty 150
  Filled 2024-08-01 (×3): qty 1000

## 2024-08-01 MED ORDER — OXYCODONE-ACETAMINOPHEN 5-325 MG PO TABS
1.0000 | ORAL_TABLET | Freq: Four times a day (QID) | ORAL | Status: DC | PRN
  Administered 2024-08-01: 1 via ORAL
  Filled 2024-08-01: qty 1

## 2024-08-01 NOTE — Evaluation (Signed)
 Physical Therapy Evaluation Patient Details Name: Chris George MRN: [id number] DOB: 08/07/1956 Today's Date: 08/01/2024  History of Present Illness  Chris George is a 68 y.o. 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  He endorsed history of peripheral neuropathy, but now complaining of both feet being numb and with severe pain on standing and walking (even to the kitchen) from the living room.  Clinical Impression  Patient seen for initial PT evaluation due to decline in functional status secondary to bilateral foot pain. Patient is A&O x 4. Baseline mobility reported as modI occasional use of RW due to pain but states that these symptoms are relatively new only occurring with in the recent week, currently requiring minA for transfers to stand at EOB. Pt required shoes to stand EOB and for ambulation due to pain in feet. Gait assessed with RW , limited by feet pain able to walk in room 20' with reports and occasional breaks due to feet pain.  Pt's brother and mother live 5 mins away, calls often and comes to visit frequently and brings dinner to pt home. Pt lives alone with 22 steps to enter his one level apartment. Clinical impression: patient presents with mild mobility limitations secondary to foot pain. Recommend skilled acute care PT to address safety, mobility, and discharge planning.          If plan is discharge home, recommend the following: A little help with walking and/or transfers;A little help with bathing/dressing/bathroom   Can travel by  private vehicle    Yes     Equipment Recommendations    Recommendations for Other Services       Functional Status Assessment Patient has had a recent decline in their functional status and/or demonstrates limited ability to make significant improvements in function in a reasonable and predictable amount of time     Precautions / Restrictions Precautions Precautions: Fall Recall of Precautions/Restrictions: Intact Restrictions Weight Bearing Restrictions Per Provider Order: No      Mobility  Bed Mobility Overal bed mobility: Modified Independent             General bed mobility comments: No physical assistance to complete bed mobillity, HOB elevated    Transfers Overall transfer level: Needs assistance Equipment used: Rolling walker (2 wheels) Transfers: Sit to/from Stand Sit to Stand: Mod assist           General transfer comment: MODA STS from lowest bed height, MINA with elevated bed height, verbal cues for hand placement    Ambulation/Gait Ambulation/Gait assistance: Min assist Gait Distance (Feet): 20 Feet Assistive device: Rolling walker (2 wheels) Gait Pattern/deviations: Step-to pattern, Decreased step length - right, Decreased step length - left Gait velocity: decreased overall gait speed due to foot pain        Stairs            Wheelchair Mobility     Tilt Bed    Modified Rankin (Stroke Patients Only)       Balance Overall balance assessment: Needs assistance Sitting-balance support: Feet supported, No  upper extremity supported Sitting balance-Leahy Scale: Normal Sitting balance - Comments: Steady reaching outside BOS   Standing balance support: Bilateral upper extremity supported, During functional activity Standing balance-Leahy Scale: Fair Standing balance comment: Limited by LE pain, realiant on RW to off weight LEs in standing                             Pertinent Vitals/Pain Pain Assessment Faces Pain  Scale: Hurts even more Pain Location: BLEs Pain Descriptors / Indicators: Shooting, Sharp Pain Intervention(s): Limited activity within patient's tolerance, Monitored during session, Repositioned, Other (comment) (performs better with shoes on; lessons peripheral neuorpathy pain)    Home Living Family/patient expects to be discharged to:: Private residence Living Arrangements: Alone Available Help at Discharge: Family;Available PRN/intermittently Type of Home: Apartment Home Access: Stairs to enter Entrance Stairs-Rails: Can reach both;Left;Right Entrance Stairs-Number of Steps: 22 steps   Home Layout: One level Home Equipment: Agricultural consultant (2 wheels);Crutches      Prior Function Prior Level of Function : Independent/Modified Independent;Driving             Mobility Comments: RW as needed for LE pain ADLs Comments: Inde with ADLs, recently limited due to reported worsening neuropathy pain. Pt's brother and mother live 5 mins away, calls often and comes to visit frequently and brings dinner.     Extremity/Trunk Assessment   Upper Extremity Assessment Upper Extremity Assessment: Generalized weakness    Lower Extremity Assessment Lower Extremity Assessment: Generalized weakness    Cervical / Trunk Assessment Cervical / Trunk Assessment: Normal  Communication   Communication Communication: No apparent difficulties    Cognition Arousal: Alert Behavior During Therapy: WFL for tasks assessed/performed                             Following commands: Intact       Cueing Cueing Techniques: Verbal cues, Tactile cues     General Comments General comments (skin integrity, edema, etc.): Pt calm and eager to work with therapy    Exercises     Assessment/Plan    PT Assessment Patient needs continued PT services  PT Problem List Decreased strength;Decreased range of motion;Decreased activity tolerance;Decreased balance;Decreased mobility       PT  Treatment Interventions DME instruction;Gait training;Therapeutic activities;Therapeutic exercise;Balance training    PT Goals (Current goals can be found in the Care Plan section)  Acute Rehab PT Goals Patient Stated Goal: pt wants to return home PT Goal Formulation: With patient Time For Goal Achievement: 08/15/24 Potential to Achieve Goals: Fair    Frequency Min 2X/week     Co-evaluation   Reason for Co-Treatment: Complexity of the patient's impairments (multi-system involvement);For patient/therapist safety;To address functional/ADL transfers   OT goals addressed during session: ADL's and self-care;Proper use of Adaptive equipment and DME       AM-PAC PT 6 Clicks Mobility  Outcome Measure Help needed turning from your back to your side while in a flat bed without using bedrails?: None Help needed moving from lying on your back to sitting on the side of a flat bed without using bedrails?: None Help needed moving to and from a bed to a chair (including a wheelchair)?: A Little Help needed standing up from a chair using your arms (e.g., wheelchair or bedside chair)?: A Little Help needed to walk in hospital room?: A Lot Help needed climbing 3-5 steps with a railing? :  Total 6 Click Score: 17    End of Session Equipment Utilized During Treatment: Gait belt Activity Tolerance: Patient tolerated treatment well;Patient limited by pain Patient left: in chair;with chair alarm set Nurse Communication: Mobility status PT Visit Diagnosis: Unsteadiness on feet (R26.81);Other abnormalities of gait and mobility (R26.89);Muscle weakness (generalized) (M62.81);Difficulty in walking, not elsewhere classified (R26.2)    Time: 9158-9097 PT Time Calculation (min) (ACUTE ONLY): 21 min   Charges:   PT Evaluation $PT Eval Moderate Complexity: 1 Mod   PT General Charges $$ ACUTE PT VISIT: 1 Visit         Sherlean Lesches DPT, PT    Sherlean A Janee Ureste 08/01/2024, 11:25 AM

## 2024-08-01 NOTE — Progress Notes (Signed)
 PROGRESS NOTE    Chris George  FMW:[id number] DOB: [birth year] DOA: 07/31/2024 PCP: Jeffie Cheryl FORBES, MD     Brief Narrative:   From admission h and p  Chris George is a 68 y.o. 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  He endorsed history of peripheral neuropathy, but now complaining of both feet being numb and with severe pain on standing and walking (even to the kitchen) from the living room.   Patient was recently admitted from 9/24 to 9/25 due to chronic infectious diarrhea in which case C. difficile that was done as an outpatient was noted to be positive for enteroaggregated E. coli.  He also had hypotension and IV fluid was provided.  Patient was discharged with Dificid  on vancomycin .   Since patient was unable to take care of of himself due to the pain and difficulty been able to stand, he returned to the ED for further evaluation.  He denied fever, chills, chest pain, shortness of breath, falls.  Assessment & Plan:   Principal Problem:   Peripheral neuropathy Active Problems:   Hypertension, essential, benign   HFrEF (heart failure with reduced ejection fraction) (HCC)   CKD (chronic kidney disease) stage 4, GFR 15-29 ml/min (HCC)   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 03/2024   Permanent atrial fibrillation (HCC)   Multiple myeloma (HCC)   Hepatic cirrhosis (HCC)   OSA (obstructive sleep apnea)   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HCC)   Diabetes  mellitus type 2, controlled, without complications (HCC)   History of CVA (cerebrovascular accident)   Chronic diarrhea  # Neuropathy Likely exacerbated by chemotherapy. - home oxy prn - add gabapentin  - check mri given complaint of new weakness - pt/ot consults  # C diff Discharged on oral vanc and dificid , this appears to be an error, and these two meds were continued on admission. Is improving - stop oral vanc - continue dificid  - precautions not ordered on admission, will order, will need to transfer out of a double room  # Pancytopenia 2/2 chemo, malignancy. Hgb 6s today, no bleeding. Thrombocytopenia and leukopenia stable, no neutropenia - transfuse 1 unit.  # CKD4 # Metabolic acidosis Stable - likely d/c on sodium bicarb - will start gentle gtt today  # HFrecoveredEF Appears euvolemic - home meds on hold 2/2 hypotension  # Metastatic colon cancer Followed by dr. 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  # Hypotension Chronic, asymptomatic - monitor  # A-fib Rate controlled not on meds, s/p multiple ablations - cont home apixaban   # T2DM Euglycemic not on meds - monitor  # Cirrhosis Appears compensated  # Hx prostate cancer S/p radiation therapy - cont home flomax    DVT prophylaxis: apixaban  Code Status: full Family Communication: none at bedside  Level of care: Telemetry Medical Status is: Observation    Consultants:  none  Procedures: none  Antimicrobials:  dificid     Subjective: Reports improving stools,  ongoing pain in b/l feet  Objective: Vitals:   08/01/24 0100 08/01/24 0408 08/01/24 0443 08/01/24 0726  BP:  (!) 91/48  (!) 81/45  Pulse: 89 (!) 42    Resp:  16  16  Temp:  98.1 F (36.7 C)  98.6 F (37 C)  TempSrc:    Oral  SpO2:  100%  95%  Weight:   80 kg   Height:        Intake/Output Summary (Last 24 hours) at 08/01/2024 0937 Last data filed at 07/31/2024  1955 Gross per 24 hour  Intake 120 ml  Output --  Net 120 ml   Filed Weights   07/31/24 1238 08/01/24 0443  Weight: 80 kg 80 kg    Examination:  General exam: Appears calm and comfortable  Respiratory system: Clear to auscultation. Respiratory effort normal. Cardiovascular system: S1 & S2 heard, bradycardic Gastrointestinal system: Abdomen is nondistended, soft and nontender.  Central nervous system: Alert and oriented. Decreased strength b/l lower extremities Extremities: warm no edema Skin: No rashes, lesions or ulcers Psychiatry: Judgement and insight appear normal. Mood & affect appropriate.     Data Reviewed: I have personally reviewed following labs and imaging studies  CBC: Recent Labs  Lab 07/28/24 1448 07/29/24 1519 07/30/24 0553 07/31/24 1510 08/01/24 0514  WBC 4.1 5.0 3.6* 3.7* 2.8*  NEUTROABS 3.5 4.0  --  2.7  --   HGB 7.2* 8.0* 7.1* 7.8* 6.6*  HCT 22.3* 24.7* 21.6* 24.8* 20.4*  MCV 89.2 90.1 89.3 91.5 89.1  PLT 41* 38* 33* 47* 39*   Basic Metabolic Panel: Recent Labs  Lab 07/28/24 1448 07/29/24 1519 07/30/24 0553 07/30/24 1112 07/31/24 1510 08/01/24 0514  NA 130* 131* 136  --  137 134*  K 4.5 4.3 4.4  --  4.5 4.2  CL 104 104 110  --  110 111  CO2 17* 18* 18*  --  16* 16*  GLUCOSE 98 91 83  --  98 81  BUN 88* 87* 79*  --  71* 71*  CREATININE 2.35* 2.63* 2.25*  --  2.62* 2.83*  CALCIUM  8.6* 8.8* 8.5*  --  9.3 8.8*  MG 2.1  --   --  1.8  --  2.2  PHOS  --   --   --   --   --  2.9   GFR: Estimated Creatinine Clearance: 25 mL/min (A) (by C-G formula based on SCr of 2.83 mg/dL (H)). Liver Function Tests: Recent Labs  Lab 07/28/24 1448 07/29/24 1519 07/31/24 1510 08/01/24 0514  AST 19 23 22 15   ALT 15 17 15 14   ALKPHOS 211* 228* 195* 172*  BILITOT 1.7* 1.7* 1.7* 1.4*  PROT 7.3 7.8 7.8 6.8  ALBUMIN 3.8 3.8 3.6 3.3*   Recent Labs  Lab 07/29/24 1721  LIPASE 23   No results for input(s): AMMONIA in the last 168 hours. Coagulation  Profile: No results for input(s): INR, PROTIME in the last 168 hours. Cardiac Enzymes: No results for input(s): CKTOTAL, CKMB, CKMBINDEX, TROPONINI in the last 168 hours. BNP (last 3 results) No results for input(s): PROBNP in the last 8760 hours. HbA1C: No results for input(s): HGBA1C in the last 72 hours. CBG: No results for input(s): GLUCAP in the last 168 hours. Lipid Profile: No results for input(s): CHOL, HDL, LDLCALC, TRIG, CHOLHDL, LDLDIRECT in the last 72 hours. Thyroid  Function Tests: No results for input(s): TSH, T4TOTAL, FREET4, T3FREE, THYROIDAB in the last 72 hours. Anemia Panel: Recent Labs  08/01/24 0514  FERRITIN 1,107*  TIBC 225*  IRON 27*   Urine analysis:    Component Value Date/Time   COLORURINE YELLOW (A) 07/14/2024 1009   APPEARANCEUR CLEAR (A) 07/14/2024 1009   APPEARANCEUR Hazy 07/24/2012 2039   LABSPEC 1.011 07/14/2024 1009   LABSPEC 1.014 07/24/2012 2039   PHURINE 5.0 07/14/2024 1009   GLUCOSEU NEGATIVE 07/14/2024 1009   GLUCOSEU Negative 07/24/2012 2039   HGBUR NEGATIVE 07/14/2024 1009   BILIRUBINUR NEGATIVE 07/14/2024 1009   BILIRUBINUR Negative 07/24/2012 2039   KETONESUR NEGATIVE 07/14/2024 1009   PROTEINUR NEGATIVE 07/14/2024 1009   NITRITE NEGATIVE 07/14/2024 1009   LEUKOCYTESUR NEGATIVE 07/14/2024 1009   LEUKOCYTESUR Trace 07/24/2012 2039   Sepsis Labs: @LABRCNTIP (procalcitonin:4,lacticidven:4)  ) Recent Results (from the past 240 hours)  Gastrointestinal Panel by PCR , Stool     Status: Abnormal   Collection Time: 07/29/24 10:26 AM   Specimen: Stool  Result Value Ref Range Status   Campylobacter species NOT DETECTED NOT DETECTED Final   Plesimonas shigelloides NOT DETECTED NOT DETECTED Final   Salmonella species NOT DETECTED NOT DETECTED Final   Yersinia enterocolitica NOT DETECTED NOT DETECTED Final   Vibrio species NOT DETECTED NOT DETECTED Final   Vibrio cholerae NOT DETECTED NOT  DETECTED Final   Enteroaggregative E coli (EAEC) DETECTED (A) NOT DETECTED Final    Comment: RESULT CALLED TO, READ BACK BY AND VERIFIED WITH: CHASE ISLEY 1242 07/29/24 MU    Enteropathogenic E coli (EPEC) NOT DETECTED NOT DETECTED Final   Enterotoxigenic E coli (ETEC) NOT DETECTED NOT DETECTED Final   Shiga like toxin producing E coli (STEC) NOT DETECTED NOT DETECTED Final   Shigella/Enteroinvasive E coli (EIEC) NOT DETECTED NOT DETECTED Final   Cryptosporidium NOT DETECTED NOT DETECTED Final   Cyclospora cayetanensis NOT DETECTED NOT DETECTED Final   Entamoeba histolytica NOT DETECTED NOT DETECTED Final   Giardia lamblia NOT DETECTED NOT DETECTED Final   Adenovirus F40/41 NOT DETECTED NOT DETECTED Final   Astrovirus NOT DETECTED NOT DETECTED Final   Norovirus GI/GII NOT DETECTED NOT DETECTED Final   Rotavirus A NOT DETECTED NOT DETECTED Final   Sapovirus (I, II, IV, and V) NOT DETECTED NOT DETECTED Final    Comment: Performed at [facility], [address] [place], [place] [address]  C difficile quick screen w PCR reflex     Status: Abnormal   Collection Time: 07/29/24 10:27 AM   Specimen: STOOL  Result Value Ref Range Status   C Diff antigen POSITIVE (A) NEGATIVE Final   C Diff toxin NEGATIVE NEGATIVE Final   C Diff interpretation Results are indeterminate. See PCR results.  Final    Comment: Performed at [facility], [address] [place], [place] [address]  C. Diff by PCR, Reflexed     Status: Abnormal   Collection Time: 07/29/24 10:27 AM  Result Value Ref Range Status   Toxigenic C. Difficile by PCR POSITIVE (A) NEGATIVE Final    Comment: Positive for toxigenic C. difficile with little to no toxin production. Only treat if clinical presentation suggests symptomatic illness.   Hypervirulent Strain PRESUMPTIVE NEGATIVE PRESUMPTIVE NEGATIVE Final    Comment: Performed at [facility], [address], [place], [place] [address]          Radiology Studies: DG Foot Complete Left Result Date: 07/31/2024 CLINICAL DATA:  pain EXAM: LEFT FOOT - COMPLETE 3+ VIEW COMPARISON:  None Available. FINDINGS: No acute fracture or dislocation. There is no evidence of arthropathy  or other focal bone abnormality. Soft tissues are unremarkable. No radiopaque foreign body. IMPRESSION: No acute fracture or dislocation. Electronically Signed   By: Rogelia Myers M.D.   On: 07/31/2024 15:19   DG Foot Complete Right Result Date: 07/31/2024 CLINICAL DATA:  pain EXAM: RIGHT FOOT COMPLETE - 3+ VIEW COMPARISON:  None Available. FINDINGS: No acute fracture or dislocation. There is no evidence of arthropathy or other focal bone abnormality. Soft tissues are unremarkable. No radiopaque foreign body. IMPRESSION: No acute fracture or dislocation. Electronically Signed   By: Rogelia Myers M.D.   On: 07/31/2024 15:18        Scheduled Meds:  sodium chloride    Intravenous Once   apixaban   5 mg Oral BID   atorvastatin   80 mg Oral Daily   calcitRIOL   0.25 mcg Oral Daily   fidaxomicin   200 mg Oral BID   gabapentin   300 mg Oral BID   mirabegron  ER  25 mg Oral Daily   tamsulosin   0.4 mg Oral QPC supper   Continuous Infusions:   LOS: 0 days     Devaughn KATHEE Ban, MD [facility]   If 7PM-7AM, please contact night-coverage [email or web address] Password [email or web address] 08/01/2024, 9:37 AM

## 2024-08-01 NOTE — Evaluation (Signed)
 Occupational Therapy Evaluation Patient Details Name: Chris George MRN: [id number] DOB: 01/16/1956 Today's Date: 08/01/2024   History of Present Illness   Chris George is a 68 y.o. 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  He endorsed history of peripheral neuropathy, but now complaining of both feet being numb and with severe pain on standing and walking (even to the kitchen) from the living room.     Clinical Impressions Pt was seen for OT/PT co evaluation this date. Prior to hospital admission, pt was indep with ADLs/IADLs, recently limited ADL participation due to reported worsening neuropathy pain. PRN use of RW when pain levels are high. Pt's brother and mother live 5 mins away, calls often and comes to visit frequently and brings dinner to pt home. Pt lives alone with 22 steps to enter his one level apartment. Pt presents with deficits in decreased indep in self care, balance, functional mobility/transfers and activity tolerance affecting safe and optimal ADL completion. Pt is limited pt by his bilateral LE pain throughout session, however highly motivated to get better. Pt completed LB dressing with MIN for donning of bilateral tennis shoes. Pt currently requires MODA for initial STS from low bed height with use of RW, verbal cues for hand placement and technique. Pt utilizes RW during amb within room for simulated toilet t/f into recliner, CGA throughout with intermittent MINA for DME  management during ambulation. Pt would benefit from skilled OT services to address noted impairments and functional limitations (see below for any additional details) in order to maximize safety and independence while minimizing future risk of falls, injury, and readmission. OT will follow acutely.    If plan is discharge home, recommend the following:   A little help with walking and/or transfers;Assistance with cooking/housework     Functional Status Assessment   Patient has had a recent decline in their functional status and demonstrates the ability to make significant improvements in function in a reasonable and predictable amount of time.     Equipment Recommendations   None recommended by OT              Precautions/Restrictions   Precautions Precautions: Fall Recall of Precautions/Restrictions: Intact Restrictions Weight Bearing Restrictions Per Provider Order: No     Mobility Bed Mobility Overal bed mobility: Modified Independent             General bed mobility comments: No physical assistance to complete bed mobillity, HOB elevated    Transfers Overall transfer level: Needs assistance Equipment used: Rolling walker (2 wheels) Transfers: Sit to/from Stand Sit to Stand: Mod assist           General transfer comment: MODA STS from lowest bed height, MINA with elevated bed height, verbal cues for hand placement      Balance Overall balance assessment: Needs assistance Sitting-balance support: Feet supported, No upper extremity supported Sitting balance-Leahy Scale: Normal Sitting balance - Comments: Steady reaching outside BOS   Standing balance support: Bilateral upper extremity supported, During  functional activity Standing balance-Leahy Scale: Fair Standing balance comment: Limited by LE pain, realiant on RW to off weight LEs in standing                           ADL either performed or assessed with clinical judgement    ADL Overall ADL's : Needs assistance/impaired Eating/Feeding: Sitting;Set up   Grooming: Wash/dry face;Set up               Lower Body Dressing: Set up;Sitting/lateral leans Lower Body Dressing Details (indicate cue type and reason): Donning Bil socks, MINA donning tennis shoes in prep for amb Toilet Transfer: Contact guard assist;Minimal assistance;Ambulation;Rolling walker (2 wheels) Toilet Transfer Details (indicate cue type and reason): Simulated toilet transfer         Functional mobility during ADLs: Rolling walker (2 wheels);Contact guard assist;Minimal assistance General ADL Comments: MINA donning bilateral tennis shoes in sitting, setup seated grooming tasks     Vision Baseline Vision/History: 0 No visual deficits Patient Visual Report: No change from baseline                         Pertinent Vitals/Pain Pain Assessment Pain Assessment: Faces Faces Pain Scale: Hurts even more Pain Location: BLEs Pain Descriptors / Indicators: Shooting, Sharp Pain Intervention(s): Limited activity within patient's tolerance, Monitored during session, Patient requesting pain meds-RN notified, Repositioned     Extremity/Trunk Assessment Upper Extremity Assessment Upper Extremity Assessment: Generalized weakness   Lower Extremity Assessment Lower Extremity Assessment: Defer to PT evaluation;Generalized weakness   Cervical / Trunk Assessment Cervical / Trunk Assessment: Normal   Communication Communication Communication: No apparent difficulties   Cognition Arousal: Alert Behavior During Therapy: WFL for tasks assessed/performed Cognition: No apparent impairments             OT - Cognition Comments: A/Ox4                 Following commands: Intact       Cueing  General Comments   Cueing Techniques: Verbal cues;Tactile cues  Pt calm and eager to work with therapy   Exercises Exercises: Other exercises Other Exercises Other Exercises: Edu:  Role of OT eval, safe ADL completion, DME management, fall prevention   Shoulder Instructions      Home Living Family/patient expects to be discharged to:: Private residence Living Arrangements: Alone Available Help at Discharge: Family;Available PRN/intermittently Type of Home: Apartment Home Access: Stairs to enter Entrance Stairs-Number of Steps: 22 steps Entrance Stairs-Rails: Can reach both;Left;Right Home Layout: One level     Bathroom Shower/Tub: Chief Strategy Officer: Standard     Home Equipment: Agricultural consultant (2 wheels);Crutches          Prior Functioning/Environment Prior Level of Function : Independent/Modified Independent;Driving             Mobility Comments: RW as needed for LE pain ADLs Comments: Inde with ADLs, recently limited due to reported worsening neuropathy pain. Pt's brother and mother live 5 mins away, calls often and comes to visit frequently and brings dinner.    OT Problem List: Decreased strength;Decreased activity tolerance;Impaired balance (sitting and/or standing);Decreased coordination;Decreased safety awareness;Decreased knowledge of use of DME or AE;Decreased knowledge of precautions;Pain   OT Treatment/Interventions: Self-care/ADL training;DME and/or AE instruction;Energy conservation;Therapeutic exercise;Therapeutic activities;Balance training      OT Goals(Current goals can be found in the care plan section)   Acute Rehab OT Goals  Patient Stated Goal: Less LE pain OT Goal Formulation: With patient Time For Goal Achievement: 08/15/24 Potential to Achieve Goals: Good ADL Goals Pt Will Perform Grooming: with modified independence;standing Pt Will Perform Lower Body Dressing: with modified independence;sit to/from stand Pt Will Transfer to Toilet: with modified independence;ambulating Pt Will Perform Toileting - Clothing Manipulation and hygiene: with modified independence;sit to/from stand   OT Frequency:  Min  2X/week    Co-evaluation PT/OT/SLP Co-Evaluation/Treatment: Yes Reason for Co-Treatment: Complexity of the patient's impairments (multi-system involvement);For patient/therapist safety;To address functional/ADL transfers   OT goals addressed during session: ADL's and self-care;Proper use of Adaptive equipment and DME      AM-PAC OT 6 Clicks Daily Activity     Outcome Measure Help from another person eating meals?: None Help from another person taking care of personal grooming?: None Help from another person toileting, which includes using toliet, bedpan, or urinal?: A Little Help from another person bathing (including washing, rinsing, drying)?: A Little Help from another person to put on and taking off regular upper body clothing?: None Help from another person to put on and taking off regular lower body clothing?: None 6 Click Score: 22   End of Session Equipment Utilized During Treatment: Gait belt;Rolling walker (2 wheels) Nurse Communication: Mobility status  Activity Tolerance: Patient tolerated treatment well Patient left: in chair;with call bell/phone within reach;with chair alarm set  OT Visit Diagnosis: Unsteadiness on feet (R26.81);Other abnormalities of gait and mobility (R26.89);Muscle weakness (generalized) (M62.81)                Time: 9158-9097 OT Time Calculation (min): 21 min Charges:  OT General Charges $OT Visit: 1 Visit OT Evaluation $OT Eval Low Complexity: 1 Low  Consuelo Suthers M.S. OTR/L  08/01/24, 10:08 AM

## 2024-08-01 NOTE — Plan of Care (Signed)

## 2024-08-01 NOTE — Progress Notes (Signed)
   08/01/24 2001  Assess: MEWS Score  Temp 97.6 F (36.4 C)  BP (!) 90/54  MAP (mmHg) 66  Pulse Rate (!) 50  SpO2 100 %  O2 Device Room Air  Assess: MEWS Score  MEWS Temp 0  MEWS Systolic 1  MEWS Pulse 1  MEWS RR 0  MEWS LOC 0  MEWS Score 2  MEWS Score Color Yellow  Assess: if the MEWS score is Yellow or Red  Were vital signs accurate and taken at a resting state? Yes  Does the patient meet 2 or more of the SIRS criteria? No  Does the patient have a confirmed or suspected source of infection? No  MEWS guidelines implemented  Yes, yellow  Treat  MEWS Interventions Considered administering scheduled or prn medications/treatments as ordered  Take Vital Signs  Increase Vital Sign Frequency  Yellow: Q2hr x1, continue Q4hrs until patient remains green for 12hrs  Escalate  MEWS: Escalate Yellow: Discuss with charge nurse and consider notifying provider and/or RRT  Notify: Charge Nurse/RN  Name of Charge Nurse/RN Notified [facility]  Provider Notification  Provider Name/Title D  Date Provider Notified 08/01/24  Time Provider Notified 2231  Method of Notification Page  Notification Reason Other (Comment)  Provider response No new orders  Date of Provider Response 08/01/24  Time of Provider Response 2231  Assess: SIRS CRITERIA  SIRS Temperature  0  SIRS Respirations  0  SIRS Pulse 0  SIRS WBC 0  SIRS Score Sum  0

## 2024-08-01 NOTE — Progress Notes (Signed)
 Patient is alert and oriented X 4. 1 unit blood transfusion done. No any adverse reaction seen. Pt tolerated well.

## 2024-08-02 DIAGNOSIS — G6289 Other specified polyneuropathies: Secondary | ICD-10-CM | POA: Diagnosis not present

## 2024-08-02 LAB — BPAM RBC
Blood Product Expiration Date: 202510172359
ISSUE DATE / TIME: 202509271327
Unit Type and Rh: 5100

## 2024-08-02 LAB — BASIC METABOLIC PANEL WITH GFR
Anion gap: 11 (ref 5–15)
BUN: 67 mg/dL — ABNORMAL HIGH (ref 8–23)
CO2: 17 mmol/L — ABNORMAL LOW (ref 22–32)
Calcium: 9 mg/dL (ref 8.9–10.3)
Chloride: 105 mmol/L (ref 98–111)
Creatinine, Ser: 3.09 mg/dL — ABNORMAL HIGH (ref 0.61–1.24)
GFR, Estimated: 21 mL/min — ABNORMAL LOW (ref >60)
Glucose, Bld: 109 mg/dL — ABNORMAL HIGH (ref 70–99)
Potassium: 4.3 mmol/L (ref 3.5–5.1)
Sodium: 133 mmol/L — ABNORMAL LOW (ref 135–145)

## 2024-08-02 LAB — CBC
HCT: 24.8 % — ABNORMAL LOW (ref 39.0–52.0)
Hemoglobin: 8.1 g/dL — ABNORMAL LOW (ref 13.0–17.0)
MCH: 28.7 pg (ref 26.0–34.0)
MCHC: 32.7 g/dL (ref 30.0–36.0)
MCV: 87.9 fL (ref 80.0–100.0)
Platelets: 51 K/uL — ABNORMAL LOW (ref 150–400)
RBC: 2.82 MIL/uL — ABNORMAL LOW (ref 4.22–5.81)
RDW: 19.2 % — ABNORMAL HIGH (ref 11.5–15.5)
WBC: 4.8 K/uL (ref 4.0–10.5)
nRBC: 0.4 % — ABNORMAL HIGH (ref 0.0–0.2)

## 2024-08-02 LAB — TYPE AND SCREEN
ABO/RH(D): O POS
Antibody Screen: NEGATIVE
Unit division: 0

## 2024-08-02 LAB — TSH: TSH: 2.668 u[IU]/mL (ref 0.350–4.500)

## 2024-08-02 LAB — VITAMIN B12: Vitamin B-12: 264 pg/mL (ref 180–914)

## 2024-08-02 MED ORDER — HEPARIN NA (PORK) LOCK FLSH PF 10 UNIT/ML IV SOLN
10.0000 [IU] | Freq: Once | INTRAVENOUS | Status: AC
  Administered 2024-08-03: 10 [IU]
  Filled 2024-08-02: qty 5

## 2024-08-02 MED ORDER — GABAPENTIN 100 MG PO CAPS: 200.0000 mg | ORAL_CAPSULE | Freq: Two times a day (BID) | ORAL | 1 refills | Status: AC

## 2024-08-02 MED ORDER — SODIUM BICARBONATE 650 MG PO TABS: 650.0000 mg | ORAL_TABLET | Freq: Two times a day (BID) | ORAL | 1 refills | Status: AC

## 2024-08-02 NOTE — TOC Initial Note (Addendum)
 Transition of Care [facility]) - Initial/Assessment Note    Patient Details  Name: Chris George MRN: [id number] Date of Birth: Jul 05, 1956  [facility]) CM/SW Contact:    [place] L Adana Marik, LCSW Phone Number: 08/02/2024, 11:38 AM  Clinical Narrative:                  Chart reviewed. Recommendations for HHPT and OT. CSW completed a search on [employer]. Patient was receiving HHA from [facility] in February 2025. CSW met with patient. No family at bedside. Patient advised that he is currently working with a HHA agency for Speech Therapy but he could not remember the name of the agency. CSW mentioned [facility] and patient still could not recall. Patient advised of no choice regarding HHA. CSW reviewed patients insurance provider. Patient was agreeable to CSW contacting [facility] to determine if PT and OT was available. CSW contacted [facility] and spoke with Channing who confirmed that [facility] would be 24 to 48 hours after discharge.   CSW attempted to complete brief assessment with patient however, patient had low energy and was falling asleep during interaction.   No recommendations for DME.        Patient Goals and CMS Choice            Expected Discharge Plan and Services                                              Prior Living Arrangements/Services                       Activities of Daily Living   ADL Screening (condition at time of admission) Independently performs ADLs?: Yes (appropriate for developmental age) Is the patient deaf or have difficulty hearing?: No Does the patient have difficulty seeing, even when wearing glasses/contacts?: No Does the patient have difficulty concentrating, remembering, or making decisions?: No  Permission Sought/Granted                  Emotional Assessment              Admission diagnosis:  Peripheral neuropathy [G62.9] Unable to walk [R26.2] Acute pain [R52] 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, 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] Patient Active Problem List   Diagnosis Date Noted   Peripheral neuropathy 07/31/2024   Chronic diarrhea 07/31/2024   Clostridium difficile diarrhea 07/30/2024   Acute infectious diarrhea 07/29/2024   Hypotension 07/29/2024   Permanent atrial fibrillation (HCC) 07/29/2024   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 03/2024 03/17/2024   Pancytopenia (HCC) 03/13/2024   CKD (chronic kidney disease) stage 4, GFR 15-29 ml/min (HCC) 03/13/2024   Lower GI bleed 03/13/2024   HFrEF (heart failure with reduced ejection fraction) (HCC) 06/12/2023   Drug-induced polyneuropathy 04/22/2023   Hepatic cirrhosis (HCC) 04/22/2023   Major depressive disorder, single episode, mild 04/22/2023   Malignant neoplasm of prostate (HCC) 04/22/2023   Secondary hyperparathyroidism of renal origin 04/22/2023   History of CVA (cerebrovascular accident) 04/05/2023   Personal history of colon cancer    Chemotherapy-induced peripheral neuropathy 02/16/2022   Diabetes mellitus type 2, controlled, without complications (HCC) 08/07/2021   Neuropathy 08/30/2020   Port-A-Cath in place    Abnormal ECG 11/04/2019   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HCC) 10/27/2019   Goals of care, counseling/discussion 05/18/2019   Multiple myeloma (HCC) 05/17/2019  Anemia 05/12/2019   Arthritis of left ankle 02/05/2018   Cardiomyopathy, idiopathic (HCC) 01/07/2018   Moderate tricuspid insufficiency 01/01/2017   OSA (obstructive sleep apnea) 01/01/2017   Chronic a-fib (HCC) 12/06/2015   Pure hypercholesterolemia 02/09/2015   Anticoagulant long-term use 04/29/2014   Hyperlipidemia 04/29/2014   Hypertension, essential, benign 04/26/2014   PCP:  Jeffie Cheryl BRAVO, MD Pharmacy:   [facility] [address], [place] - [address] [place] [address] Phone: [phone] Fax: [phone]  [facility] - [place], [place] - [address] [place] [address] Phone:  [phone] Fax: [phone]     Social Drivers of Health (SDOH) Social History: SDOH Screenings   Food Insecurity: Food Insecurity Present (08/01/2024)  Housing: Unknown (08/01/2024)  Transportation Needs: No Transportation Needs (08/01/2024)  Utilities: Not At Risk (08/01/2024)  Alcohol Screen: Low Risk  (11/08/2023)  Depression (PHQ2-9): Low Risk  (07/21/2024)  Financial Resource Strain: Medium Risk (06/09/2024)   Received from [facility]  Physical Activity: Inactive (11/08/2023)  Social Connections: Unknown (08/01/2024)  Stress: Stress Concern Present (05/28/2019)  Tobacco Use: Low Risk  (07/31/2024)   SDOH Interventions:     Readmission Risk Interventions     No data to display

## 2024-08-02 NOTE — Progress Notes (Signed)
 PROGRESS NOTE    Chris George  FMW:[id number] DOB: 13-Dec-1955 DOA: 07/31/2024 PCP: Jeffie Cheryl FORBES, MD     Brief Narrative:   From admission h and p  Chris George is a 68 y.o. 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  He endorsed history of peripheral neuropathy, but now complaining of both feet being numb and with severe pain on standing and walking (even to the kitchen) from the living room.   Patient was recently admitted from 9/24 to 9/25 due to chronic infectious diarrhea in which case C. difficile that was done as an outpatient was noted to be positive for enteroaggregated E. coli.  He also had hypotension and IV fluid was provided.  Patient was discharged with Dificid  on vancomycin .   Since patient was unable to take care of of himself due to the pain and difficulty been able to stand, he returned to the ED for further evaluation.  He denied fever, chills, chest pain, shortness of breath, falls.  Assessment & Plan:   Principal Problem:   Peripheral neuropathy Active Problems:   Hypertension, essential, benign   HFrEF (heart failure with reduced ejection fraction) (HCC)   CKD (chronic kidney disease) stage 4, GFR 15-29 ml/min (HCC)   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 03/2024   Permanent atrial fibrillation (HCC)   Multiple myeloma (HCC)   Hepatic cirrhosis (HCC)   OSA (obstructive sleep apnea)   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HCC)   Diabetes  mellitus type 2, controlled, without complications (HCC)   History of CVA (cerebrovascular accident)   Chronic diarrhea  # Neuropathy Likely exacerbated by chemotherapy. Mri nothing acute - home oxy prn - add gabapentin  - pt/ot advising hh, ordered. Patient thinks he needs a walker, ordered. Refuses discharge today after it was delivered.   # C diff Discharged on oral vanc and dificid , this appears to be an error, and these two meds were continued on admission. Is improving - continue dificid   # Pancytopenia 2/2 chemo, malignancy. Hgb 6s yesterday, no bleeding. Thrombocytopenia and leukopenia stable, no neutropenia. Appropriate rise with 1 unit - heme f/u next week  # CKD4 # Metabolic acidosis Stable - start sodium bicarb  # HFrecoveredEF Appears euvolemic - home meds on hold 2/2 hypotension  # Metastatic colon cancer Followed by dr. 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  # Hypotension Chronic, asymptomatic - monitor  # A-fib Rate controlled not on meds, s/p multiple ablations - cont home apixaban   # T2DM Euglycemic not on meds - monitor  # Cirrhosis Appears compensated  # Hx prostate cancer S/p radiation therapy - cont home flomax    DVT prophylaxis: apixaban  Code Status: full Family Communication: none at bedside  Level of care: Telemetry Medical Status is: Observation    Consultants:  none  Procedures: none  Antimicrobials:  dificid     Subjective: Reports improving stools, ongoing pain in b/l feet but some improvement w/ gabapentin   Objective: Vitals:   08/02/24 0112 08/02/24 0507 08/02/24 0845 08/02/24 1218  BP: 95/75 (!) 92/50 (!) 88/52 (!) 83/56  Pulse: (!) 58  (!) 47 (!) 41  Resp:   16 17  Temp: 97.6 F (36.4 C) 98.1 F (36.7 C) (!) 97.5 F (36.4 C) 97.7 F (36.5 C)  TempSrc:      SpO2:  100% 99% 100%  Weight:      Height:        Intake/Output Summary (Last 24 hours) at  08/02/2024 1724 Last data filed at 08/02/2024 0900 Gross per 24 hour  Intake 360 ml  Output 100 ml  Net 260 ml   Filed Weights   07/31/24 1238 08/01/24 0443  Weight: 80 kg 80 kg    Examination:  General exam: Appears calm and comfortable  Respiratory system: Clear to auscultation. Respiratory effort normal. Cardiovascular system: S1 & S2 heard, bradycardic Gastrointestinal system: Abdomen is nondistended, soft and nontender.  Central nervous system: Alert and oriented. Decreased strength b/l lower extremities Extremities: warm no edema Skin: No rashes, lesions or ulcers Psychiatry: Judgement and insight appear normal. Mood & affect appropriate.     Data Reviewed: I have personally reviewed following labs and imaging studies  CBC: Recent Labs  Lab 07/28/24 1448 07/29/24 1519 07/30/24 0553 07/31/24 1510 08/01/24 0514 08/02/24 0342  WBC 4.1 5.0 3.6* 3.7* 2.8* 4.8  NEUTROABS 3.5 4.0  --  2.7  --   --   HGB 7.2* 8.0* 7.1* 7.8* 6.6* 8.1*  HCT 22.3* 24.7* 21.6* 24.8* 20.4* 24.8*  MCV 89.2 90.1 89.3 91.5 89.1 87.9  PLT 41* 38* 33* 47* 39* 51*   Basic Metabolic Panel: Recent Labs  Lab 07/28/24 1448 07/29/24 1519 07/30/24 0553 07/30/24 1112 07/31/24 1510 08/01/24 0514 08/02/24 0342  NA 130* 131* 136  --  137 134* 133*  K 4.5 4.3 4.4  --  4.5 4.2 4.3  CL 104 104 110  --  110 111 105  CO2 17* 18* 18*  --  16* 16* 17*  GLUCOSE 98 91 83  --  98 81 109*  BUN 88* 87* 79*  --  71* 71* 67*  CREATININE 2.35* 2.63* 2.25*  --  2.62* 2.83* 3.09*  CALCIUM  8.6* 8.8* 8.5*  --  9.3 8.8* 9.0  MG 2.1  --   --  1.8  --  2.2  --   PHOS  --   --   --   --   --  2.9  --    GFR: Estimated Creatinine Clearance: 22.9 mL/min (A) (by C-G formula based on SCr of 3.09 mg/dL (H)). Liver Function Tests: Recent Labs  Lab 07/28/24 1448 07/29/24 1519 07/31/24 1510 08/01/24 0514  AST 19 23 22 15   ALT 15 17 15 14   ALKPHOS 211* 228* 195* 172*  BILITOT 1.7* 1.7* 1.7* 1.4*  PROT 7.3 7.8 7.8  6.8  ALBUMIN 3.8 3.8 3.6 3.3*   Recent Labs  Lab 07/29/24 1721  LIPASE 23   No results for input(s): AMMONIA in the last 168 hours. Coagulation Profile: No results for input(s): INR, PROTIME in the last 168 hours. Cardiac Enzymes: No results for input(s): CKTOTAL, CKMB, CKMBINDEX, TROPONINI in the last 168 hours. BNP (last 3 results) No results for input(s): PROBNP in the last 8760 hours. HbA1C: No results for input(s): HGBA1C in the last 72 hours. CBG: No results for input(s): GLUCAP in the last 168 hours. Lipid Profile: No results for input(s): CHOL, HDL, LDLCALC, TRIG,  CHOLHDL, LDLDIRECT in the last 72 hours. Thyroid  Function Tests: Recent Labs    08/02/24 0342  TSH 2.668   Anemia Panel: Recent Labs    08/01/24 0514  FERRITIN 1,107*  TIBC 225*  IRON 27*   Urine analysis:    Component Value Date/Time   COLORURINE YELLOW (A) 07/14/2024 1009   APPEARANCEUR CLEAR (A) 07/14/2024 1009   APPEARANCEUR Hazy 07/24/2012 2039   LABSPEC 1.011 07/14/2024 1009   LABSPEC 1.014 07/24/2012 2039   PHURINE 5.0 07/14/2024 1009   GLUCOSEU NEGATIVE 07/14/2024 1009   GLUCOSEU Negative 07/24/2012 2039   HGBUR NEGATIVE 07/14/2024 1009   BILIRUBINUR NEGATIVE 07/14/2024 1009   BILIRUBINUR Negative 07/24/2012 2039   KETONESUR NEGATIVE 07/14/2024 1009   PROTEINUR NEGATIVE 07/14/2024 1009   NITRITE NEGATIVE 07/14/2024 1009   LEUKOCYTESUR NEGATIVE 07/14/2024 1009   LEUKOCYTESUR Trace 07/24/2012 2039   Sepsis Labs: @LABRCNTIP (procalcitonin:4,lacticidven:4)  ) Recent Results (from the past 240 hours)  Gastrointestinal Panel by PCR , Stool     Status: Abnormal   Collection Time: 07/29/24 10:26 AM   Specimen: Stool  Result Value Ref Range Status   Campylobacter species NOT DETECTED NOT DETECTED Final   Plesimonas shigelloides NOT DETECTED NOT DETECTED Final   Salmonella species NOT DETECTED NOT DETECTED Final   Yersinia enterocolitica NOT DETECTED NOT  DETECTED Final   Vibrio species NOT DETECTED NOT DETECTED Final   Vibrio cholerae NOT DETECTED NOT DETECTED Final   Enteroaggregative E coli (EAEC) DETECTED (A) NOT DETECTED Final    Comment: RESULT CALLED TO, READ BACK BY AND VERIFIED WITH: CHASE ISLEY 1242 07/29/24 MU    Enteropathogenic E coli (EPEC) NOT DETECTED NOT DETECTED Final   Enterotoxigenic E coli (ETEC) NOT DETECTED NOT DETECTED Final   Shiga like toxin producing E coli (STEC) NOT DETECTED NOT DETECTED Final   Shigella/Enteroinvasive E coli (EIEC) NOT DETECTED NOT DETECTED Final   Cryptosporidium NOT DETECTED NOT DETECTED Final   Cyclospora cayetanensis NOT DETECTED NOT DETECTED Final   Entamoeba histolytica NOT DETECTED NOT DETECTED Final   Giardia lamblia NOT DETECTED NOT DETECTED Final   Adenovirus F40/41 NOT DETECTED NOT DETECTED Final   Astrovirus NOT DETECTED NOT DETECTED Final   Norovirus GI/GII NOT DETECTED NOT DETECTED Final   Rotavirus A NOT DETECTED NOT DETECTED Final   Sapovirus (I, II, IV, and V) NOT DETECTED NOT DETECTED Final    Comment: Performed at [facility], [address] [place], [place] [address]  C difficile quick screen w PCR reflex     Status: Abnormal   Collection Time: 07/29/24 10:27 AM   Specimen: STOOL  Result Value Ref Range Status   C Diff antigen POSITIVE (A) NEGATIVE Final   C Diff toxin NEGATIVE NEGATIVE Final   C Diff interpretation Results are indeterminate. See PCR results.  Final    Comment: Performed at [facility], [address] [place], [place] [address]  C. Diff by PCR, Reflexed     Status: Abnormal   Collection Time: 07/29/24 10:27 AM  Result Value Ref Range Status   Toxigenic C. Difficile by PCR POSITIVE (A) NEGATIVE Final    Comment: Positive for toxigenic C. difficile with little to no toxin production. Only treat if clinical presentation suggests symptomatic illness.   Hypervirulent Strain PRESUMPTIVE NEGATIVE PRESUMPTIVE NEGATIVE Final    Comment:  Performed at [facility], [address] [place], [place] [address]         Radiology Studies: MR LUMBAR SPINE WO CONTRAST  Result Date: 08/02/2024 CLINICAL DATA:  Initial evaluation for lower extremity pain and weakness. 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 EXAM: MRI LUMBAR SPINE WITHOUT CONTRAST TECHNIQUE: Multiplanar, multisequence MR imaging of the lumbar spine was performed. No intravenous contrast was administered. COMPARISON:  Prior PET-CT from 04/14/2024. FINDINGS: Segmentation: Standard. Lowest well-formed disc space labeled the L5-S1 level. Alignment: Trace facet mediated anterolisthesis of L4 on L5. Alignment otherwise normal preservation of the normal lumbar lordosis. Vertebrae: Vertebral body height maintained without acute or chronic fracture. Diffusely decreased T1 signal intensity within the visualized bone marrow, with relatively preserved fatty marrow within the L2 and L3 vertebral bodies. Findings likely reflect post radiation changes. No other discrete or worrisome osseous lesions to suggest metastatic disease or active myeloma. No other significant abnormal marrow edema. Conus medullaris and cauda equina: Conus extends to the L1 level. Conus and cauda equina appear normal. Paraspinal and other soft tissues: Scattered anasarca with stranding within the visualized abdomen and pelvis, similar to prior. Disc levels: L1-2: Normal interspace. Mild bilateral facet hypertrophy. No canal or foraminal stenosis. L2-3: Mild intervertebral disc space narrowing. Disc desiccation with mild disc bulge. Mild bilateral facet hypertrophy. No spinal stenosis. Foramina remain patent. L3-4: Disc desiccation with mild circumferential disc bulge. Mild bilateral facet hypertrophy. No spinal stenosis. Foramina remain patent. L4-5: Trace anterolisthesis. Disc desiccation with mild disc bulge. Superimposed tiny central disc protrusion minimally indents the ventral thecal  sac (series 12, image 23). Mild to moderate bilateral facet hypertrophy with trace joint effusions. No significant spinal stenosis. Mild to moderate bilateral L4 foraminal narrowing. L5-S1: Mild left-sided endplate spurring without significant disc bulge. Mild bilateral facet hypertrophy. Mild epidural lipomatosis. No spinal stenosis. Mild bilateral L5 foraminal narrowing. IMPRESSION: 1. No acute abnormality within the lumbar spine. 2. Diffusely decreased T1 signal intensity within the visualized bone marrow, with relatively preserved fatty marrow changes within the L2 and L3 vertebral bodies. Findings likely reflect post XRT changes. No other evidence for metastatic disease or active myeloma within the lumbar spine. 3. Mild multilevel degenerative disc disease and facet hypertrophy as above. No significant spinal stenosis. Mild to moderate bilateral L4 and L5 foraminal narrowing related to disc bulge and facet hypertrophy. Electronically Signed   By: Morene Hoard M.D.   On: 08/02/2024 02:34        Scheduled Meds:  apixaban   5 mg Oral BID   atorvastatin   80 mg Oral Daily   calcitRIOL   0.25 mcg Oral Daily   Chlorhexidine  Gluconate Cloth  6 each Topical Daily   fidaxomicin   200 mg Oral BID   gabapentin   200 mg Oral BID   heparin  flush  10 Units Intracatheter Once   mirabegron  ER  25 mg Oral Daily   tamsulosin   0.4 mg Oral QPC supper   Continuous Infusions:  sodium bicarbonate 150 mEq in dextrose  5 % 1,150 mL infusion 75 mL/hr at 08/02/24 1412     LOS: 1 day     Devaughn KATHEE Ban, MD [facility]   If 7PM-7AM, please contact night-coverage [email or web address] Password [facility] 08/02/2024, 5:24 PM

## 2024-08-02 NOTE — Plan of Care (Signed)
  Problem: Education: Goal: Knowledge of General Education information will improve Description Including pain rating scale, medication(s)/side effects and non-pharmacologic comfort measures Outcome: Progressing   Problem: Health Behavior/Discharge Planning: Goal: Ability to manage health-related needs will improve Outcome: Progressing   Problem: Clinical Measurements: Goal: Ability to maintain clinical measurements within normal limits will improve Outcome: Progressing Goal: Will remain free from infection Outcome: Progressing Goal: Diagnostic test results will improve Outcome: Progressing Goal: Respiratory complications will improve Outcome: Progressing Goal: Cardiovascular complication will be avoided Outcome: Progressing   Problem: Elimination: Goal: Will not experience complications related to bowel motility Outcome: Progressing Goal: Will not experience complications related to urinary retention Outcome: Progressing   Problem: Skin Integrity: Goal: Risk for impaired skin integrity will decrease Outcome: Progressing

## 2024-08-02 NOTE — Plan of Care (Signed)

## 2024-08-02 NOTE — Discharge Summary (Addendum)
 Chris George FMW:[id number] DOB: June 05, 1956 DOA: 07/31/2024  PCP: Jeffie Cheryl FORBES, MD  Admit date: 07/31/2024 Discharge date: 08/03/2024  Time spent: 35 minutes  Recommendations for Outpatient Follow-up:  Pcp f/u Nephrology f/u Oncology f/u next week as scheduled Monitor hemoglobin, kidney function, and metabolic acidosis F/u tsh and a87    Discharge Diagnoses:  Principal Problem:   Peripheral neuropathy Active Problems:   Hypertension, essential, benign   HFrEF (heart failure with reduced ejection fraction) (HCC)   CKD (chronic kidney disease) stage 4, GFR 15-29 ml/min (HCC)   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 03/2024   Permanent atrial fibrillation (HCC)   Multiple myeloma (HCC)   Hepatic cirrhosis (HCC)   OSA (obstructive sleep apnea)   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HCC)   Diabetes mellitus type 2, controlled, without complications (HCC)   History of CVA (cerebrovascular accident)   Chronic diarrhea   Discharge Condition: stable  Diet recommendation: heart healthy carb modified  Filed Weights   07/31/24 1238 08/01/24 0443 08/03/24 0500  Weight: 80 kg 80 kg 81 kg    History of present illness:  From admission h and p Chris George is a 68 y.o. 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  He endorsed history of peripheral neuropathy, but now complaining of both feet being numb and with severe pain on standing and walking (even to the kitchen) from  the living room.   Patient was recently admitted from 9/24 to 9/25 due to chronic infectious diarrhea in which case C. difficile that was done as an outpatient was noted to be positive for enteroaggregated E. coli.  He also had hypotension and IV fluid was provided.  Patient was discharged with Dificid  on vancomycin .   Since patient was unable to take care of of himself due to the pain and difficulty been able to stand, he returned to the ED for further evaluation.  He denied fever, chills, chest pain, shortness of breath, falls.    Hospital Course:   Recent admission for c diff presenting with pain in his feet. This appears to be peripheral neuropathy, he has a history of this, and suspect chemotherapy has worsened the symptoms. MRI of lumbar spine performed, no acute changes, reviewed with neurosurgery who advises no intervention. Hgb in 6s, has required periodic transfusions in the past and was given 1 unit here with appropriate rise, no melena or other bleeding. C diff responding to antibiotics, we treated with dificid  here but at discharge patient clarified that med has been prescribed but was too expensive so instead had filled oral vancomycin  prescription, we have advised he thus finish that prescription. Evaluted by pt/ot who advise home health which we have ordered, patient also requested a rolling walker, which we have ordered. We have started gabapentin  for neuropathy and patient reports partial response to that. Ckd 4 stable but co2 low so we have started sodium bicarbonate, advised patient to follow up with his nephrologist. Patient has close follow-up with oncology scheduled.  He is declining and prognosis appears to be poor, is at high risk for readmission. Tsh and b12 pending (ordered for evaluation of neuropathy)  Procedures: none   Consultations: none  Discharge Exam: Vitals:   08/03/24 0009 08/03/24 0356  BP: 109/70 (!) 86/55  Pulse: (!) 56   Resp: 18 18  Temp: 97.8 F (36.6  C) 97.7 F (36.5 C)  SpO2: 100% 99%    General: NAD Cardiovascular: RR Respiratory: bibasilar rales otherwise clear  Discharge Instructions   Discharge Instructions     Diet - low sodium heart healthy   Complete by: As directed    Increase activity slowly   Complete by: As directed       Allergies as of 08/03/2024   No Known Allergies      Medication List     PAUSE taking these medications    bumetanide  2 MG tablet Wait to take this until your doctor or other care provider tells you to start again. Commonly known as: BUMEX  Take 1.5 mg by mouth daily. Taking one in the morning.   spironolactone 25 MG tablet Wait to take this until your doctor or other care provider tells you to start again. Commonly known as: ALDACTONE Take 1 tablet by mouth every morning.       STOP taking these medications    fidaxomicin  200 MG Tabs tablet Commonly known as: DIFICID        TAKE these medications    atorvastatin  80 MG tablet Commonly known as: LIPITOR Take 80 mg by mouth daily.   calcitRIOL  0.25 MCG capsule Commonly known as: ROCALTROL  Take 0.25 mcg by mouth daily.   colchicine  0.6 MG tablet Take 1 tablet (0.6 mg total) by mouth daily as needed. Until flare resolves.   Eliquis  5 MG Tabs tablet Generic drug: apixaban  Take 5 mg by mouth 2 (two) times daily.   gabapentin  100 MG capsule Commonly known as: NEURONTIN  Take 2 capsules (200 mg total) by mouth 2 (two) times daily.   Gemtesa  75 MG Tabs Generic drug: Vibegron  Take 1 tablet (75 mg total) by mouth daily for 28 days.   Hemorrhoidal 0.25-14-74.9 % rectal ointment Generic drug: phenylephrine -shark liver oil-mineral oil-petrolatum Place 1 Application rectally 2 (two) times daily as needed for hemorrhoids.   hydrocortisone  2.5 % rectal cream Commonly known as: ANUSOL -HC Place 1 Application rectally 2 (two) times daily.   lidocaine -prilocaine  cream Commonly known as: EMLA  Apply 1 Application topically  daily.   magnesium  chloride 64 MG Tbec SR tablet Commonly known as: SLOW-MAG Take 1 tablet (64 mg total) by mouth daily.   ondansetron  8 MG tablet Commonly known as: ZOFRAN  Take 1 tablet (8 mg total) by mouth every 8 (eight) hours as needed for nausea or vomiting.   oxyCODONE -acetaminophen  5-325 MG tablet Commonly known as: PERCOCET/ROXICET Take 1-2 tablets by mouth every 6 (six) hours as needed for severe pain (pain score 7-10).   prochlorperazine  10 MG tablet Commonly known as: COMPAZINE  Take 1 tablet (10 mg total) by mouth every 6 (six) hours as needed for nausea or vomiting.   sodium bicarbonate 650 MG tablet Take 1 tablet (650 mg total) by mouth 2 (two) times daily.   tamsulosin  0.4 MG Caps capsule Commonly known as: FLOMAX  Take 1 capsule (0.4 mg total) by mouth daily after supper.   vancomycin  125 MG capsule Commonly known as: VANCOCIN  Take 1 capsule (125 mg total) by mouth 4 (four) times daily for 10 days.  Durable Medical Equipment  (From admission, onward)           Start     Ordered   08/02/24 1218  For home use only DME Walker rolling  Once       Question Answer Comment  Walker: With 5 Inch Wheels   Patient needs a walker to treat with the following condition Neuropathy      08/02/24 1217           No Known Allergies  Follow-up Information     Jeffie Cheryl BRAVO, MD Follow up.   Specialty: Family Medicine Contact information: [address] [place] [address] [phone]         Lateef, Munsoor, MD Follow up.   Specialty: Nephrology Contact information: [address] [place] [address] [phone]                  The results of significant diagnostics from this hospitalization (including imaging, microbiology, ancillary and laboratory) are listed below for reference.    Significant Diagnostic Studies: MR LUMBAR SPINE WO CONTRAST Result Date: 08/02/2024 CLINICAL DATA:  Initial evaluation  for lower extremity pain and weakness. 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 EXAM: MRI LUMBAR SPINE WITHOUT CONTRAST TECHNIQUE: Multiplanar, multisequence MR imaging of the lumbar spine was performed. No intravenous contrast was administered. COMPARISON:  Prior PET-CT from 04/14/2024. FINDINGS: Segmentation: Standard. Lowest well-formed disc space labeled the L5-S1 level. Alignment: Trace facet mediated anterolisthesis of L4 on L5. Alignment otherwise normal preservation of the normal lumbar lordosis. Vertebrae: Vertebral body height maintained without acute or chronic fracture. Diffusely decreased T1 signal intensity within the visualized bone marrow, with relatively preserved fatty marrow within the L2 and L3 vertebral bodies. Findings likely reflect post radiation changes. No other discrete or worrisome osseous lesions to suggest metastatic disease or active myeloma. No other significant abnormal marrow edema. Conus medullaris and cauda equina: Conus extends to the L1 level. Conus and cauda equina appear normal. Paraspinal and other soft tissues: Scattered anasarca with stranding within the visualized abdomen and pelvis, similar to prior. Disc levels: L1-2: Normal interspace. Mild bilateral facet hypertrophy. No canal or foraminal stenosis. L2-3: Mild intervertebral disc space narrowing. Disc desiccation with mild disc bulge. Mild bilateral facet hypertrophy. No spinal stenosis. Foramina remain patent. L3-4: Disc desiccation with mild circumferential disc bulge. Mild bilateral facet hypertrophy. No spinal stenosis. Foramina remain patent. L4-5: Trace anterolisthesis. Disc desiccation with mild disc bulge. Superimposed tiny central disc protrusion minimally indents the ventral thecal sac (series 12, image 23). Mild to moderate bilateral facet hypertrophy with trace joint effusions. No significant spinal stenosis. Mild to moderate bilateral L4 foraminal narrowing. L5-S1: Mild  left-sided endplate spurring without significant disc bulge. Mild bilateral facet hypertrophy. Mild epidural lipomatosis. No spinal stenosis. Mild bilateral L5 foraminal narrowing. IMPRESSION: 1. No acute abnormality within the lumbar spine. 2. Diffusely decreased T1 signal intensity within the visualized bone marrow, with relatively preserved fatty marrow changes within the L2 and L3 vertebral bodies. Findings likely reflect post XRT changes. No other evidence for metastatic disease or active myeloma within the lumbar spine. 3. Mild multilevel degenerative disc disease and facet hypertrophy as above. No significant spinal stenosis. Mild to moderate bilateral L4 and L5 foraminal narrowing related to disc bulge and facet hypertrophy. Electronically Signed   By: Morene Hoard M.D.   On: 08/02/2024 02:34   DG Foot Complete Left Result Date: 07/31/2024 CLINICAL DATA:  pain EXAM:  LEFT FOOT - COMPLETE 3+ VIEW COMPARISON:  None Available. FINDINGS: No acute fracture or dislocation. There is no evidence of arthropathy or other focal bone abnormality. Soft tissues are unremarkable. No radiopaque foreign body. IMPRESSION: No acute fracture or dislocation. Electronically Signed   By: Rogelia Myers M.D.   On: 07/31/2024 15:19   DG Foot Complete Right Result Date: 07/31/2024 CLINICAL DATA:  pain EXAM: RIGHT FOOT COMPLETE - 3+ VIEW COMPARISON:  None Available. FINDINGS: No acute fracture or dislocation. There is no evidence of arthropathy or other focal bone abnormality. Soft tissues are unremarkable. No radiopaque foreign body. IMPRESSION: No acute fracture or dislocation. Electronically Signed   By: Rogelia Myers M.D.   On: 07/31/2024 15:18   CT ABDOMEN PELVIS WO CONTRAST Result Date: 07/29/2024 EXAM: CT ABDOMEN AND PELVIS WITHOUT CONTRAST 07/29/2024 07:15:56 PM TECHNIQUE: CT of the abdomen and pelvis was performed without the administration of intravenous contrast. Multiplanar reformatted images are  provided for review. Automated exposure control, iterative reconstruction, and/or weight-based adjustment of the mA/kV was utilized to reduce the radiation dose to as low as reasonably achievable. COMPARISON: PET/CT dated 04/14/2024. CLINICAL HISTORY: Abdominal pain, acute, nonlocalized. Sent by PCP after positive stool sample for C-diff. Patient has hemorrhoids that cause him rectal pain. Reports he has liver cancer. Currently receives chemo every three weeks. History of Prostate cancer, colon cancer and multiple myeloma. Reports history of stroke. FINDINGS: LOWER CHEST: Cardiomegaly. Linear scarring and atelectasis in the left lower lobe. LIVER: Nodular hepatic contour. Possible subtle liver metastases on PET are not evident on this non-contrast CT. GALLBLADDER AND BILE DUCTS: Layering gallstones versus gallbladder sludge (image 37) without convincing findings to suggest acute cholecystitis. No biliary ductal dilatation. SPLEEN: No acute abnormality. PANCREAS: No acute abnormality. ADRENAL GLANDS: No acute abnormality. KIDNEYS, URETERS AND BLADDER: No stones in the kidneys or ureters. No hydronephrosis. No perinephric or periureteral stranding. Associated bladder wall thickening, favoring sequelae of chronic bilateral obstruction, although mild perivesical stranding raises the possibility of cystitis (image 71). GI AND BOWEL: Stomach demonstrates no acute abnormality. Status post right hemicolectomy. There is no bowel obstruction. PERITONEUM AND RETROPERITONEUM: Mild upper abdominal and mesenteric fluid/ascites. No free air. VASCULATURE: Atherosclerotic calcifications of the abdominal aorta and branch vessels. Aorta is normal in caliber. LYMPH NODES: 2.1 cm short axis right retrocaval nodal metastasis (image 34), similar to recent PET. 13 mm short axis right retrocrural nodal metastasis (image 19), mildly progressive . REPRODUCTIVE ORGANS: Prostatomegaly, suggesting BPH. BONES AND SOFT TISSUES: Mild degenerative  changes of the thoracolumbar spine. No acute osseous abnormality. No focal soft tissue abnormality. IMPRESSION: 1. Status post right hemicolectomy. Retrocrural and para-aortic nodal metastases, mildly progressive. 2. Suspected cirrhosis. Suspected subtle hepatic metastases are not well visualized. Mild ascites. 3. Prostatomegaly, suggesting BPH. 4. Possible cystitis, equivocal. Electronically signed by: Pinkie Pebbles MD 07/29/2024 07:35 PM EDT RP Workstation: [id number]    Microbiology: Recent Results (from the past 240 hours)  Gastrointestinal Panel by PCR , Stool     Status: Abnormal   Collection Time: 07/29/24 10:26 AM   Specimen: Stool  Result Value Ref Range Status   Campylobacter species NOT DETECTED NOT DETECTED Final   Plesimonas shigelloides NOT DETECTED NOT DETECTED Final   Salmonella species NOT DETECTED NOT DETECTED Final   Yersinia enterocolitica NOT DETECTED NOT DETECTED Final   Vibrio species NOT DETECTED NOT DETECTED Final   Vibrio cholerae NOT DETECTED NOT DETECTED Final   Enteroaggregative E coli (EAEC) DETECTED (A) NOT DETECTED Final  Comment: RESULT CALLED TO, READ BACK BY AND VERIFIED WITH: CHASE ISLEY 1242 07/29/24 MU    Enteropathogenic E coli (EPEC) NOT DETECTED NOT DETECTED Final   Enterotoxigenic E coli (ETEC) NOT DETECTED NOT DETECTED Final   Shiga like toxin producing E coli (STEC) NOT DETECTED NOT DETECTED Final   Shigella/Enteroinvasive E coli (EIEC) NOT DETECTED NOT DETECTED Final   Cryptosporidium NOT DETECTED NOT DETECTED Final   Cyclospora cayetanensis NOT DETECTED NOT DETECTED Final   Entamoeba histolytica NOT DETECTED NOT DETECTED Final   Giardia lamblia NOT DETECTED NOT DETECTED Final   Adenovirus F40/41 NOT DETECTED NOT DETECTED Final   Astrovirus NOT DETECTED NOT DETECTED Final   Norovirus GI/GII NOT DETECTED NOT DETECTED Final   Rotavirus A NOT DETECTED NOT DETECTED Final   Sapovirus (I, II, IV, and V) NOT DETECTED NOT DETECTED Final     Comment: Performed at [facility], [address] [place], [place] [address]  C difficile quick screen w PCR reflex     Status: Abnormal   Collection Time: 07/29/24 10:27 AM   Specimen: STOOL  Result Value Ref Range Status   C Diff antigen POSITIVE (A) NEGATIVE Final   C Diff toxin NEGATIVE NEGATIVE Final   C Diff interpretation Results are indeterminate. See PCR results.  Final    Comment: Performed at [facility], [address] [place], [place] [address]  C. Diff by PCR, Reflexed     Status: Abnormal   Collection Time: 07/29/24 10:27 AM  Result Value Ref Range Status   Toxigenic C. Difficile by PCR POSITIVE (A) NEGATIVE Final    Comment: Positive for toxigenic C. difficile with little to no toxin production. Only treat if clinical presentation suggests symptomatic illness.   Hypervirulent Strain PRESUMPTIVE NEGATIVE PRESUMPTIVE NEGATIVE Final    Comment: Performed at [facility], [address] [place], [place] [address]     Labs: Basic Metabolic Panel: Recent Labs  Lab 07/28/24 1448 07/29/24 1519 07/30/24 0553 07/30/24 1112 07/31/24 1510 08/01/24 0514 08/02/24 0342  NA 130* 131* 136  --  137 134* 133*  K 4.5 4.3 4.4  --  4.5 4.2 4.3  CL 104 104 110  --  110 111 105  CO2 17* 18* 18*  --  16* 16* 17*  GLUCOSE 98 91 83  --  98 81 109*  BUN 88* 87* 79*  --  71* 71* 67*  CREATININE 2.35* 2.63* 2.25*  --  2.62* 2.83* 3.09*  CALCIUM  8.6* 8.8* 8.5*  --  9.3 8.8* 9.0  MG 2.1  --   --  1.8  --  2.2  --   PHOS  --   --   --   --   --  2.9  --    Liver Function Tests: Recent Labs  Lab 07/28/24 1448 07/29/24 1519 07/31/24 1510 08/01/24 0514  AST 19 23 22 15   ALT 15 17 15 14   ALKPHOS 211* 228* 195* 172*  BILITOT 1.7* 1.7* 1.7* 1.4*  PROT 7.3 7.8 7.8 6.8  ALBUMIN 3.8 3.8 3.6 3.3*   Recent Labs  Lab 07/29/24 1721  LIPASE 23   No results for input(s): AMMONIA in the last 168 hours. CBC: Recent Labs  Lab 07/28/24 1448  07/29/24 1519 07/30/24 0553 07/31/24 1510 08/01/24 0514 08/02/24 0342  WBC 4.1 5.0 3.6* 3.7* 2.8* 4.8  NEUTROABS 3.5 4.0  --  2.7  --   --   HGB 7.2* 8.0* 7.1* 7.8* 6.6* 8.1*  HCT 22.3* 24.7* 21.6* 24.8* 20.4* 24.8*  MCV 89.2 90.1 89.3 91.5 89.1 87.9  PLT 41* 38* 33* 47* 39* 51*   Cardiac Enzymes: No results for input(s): CKTOTAL, CKMB, CKMBINDEX, TROPONINI in the last 168 hours. BNP: BNP (last 3 results) Recent Labs    10/23/23 1732  BNP 164.5*    ProBNP (last 3 results) No results for input(s): PROBNP in the last 8760 hours.  CBG: No results for input(s): GLUCAP in the last 168 hours.     Signed:  Devaughn KATHEE Ban MD.  [facility] 08/03/2024, 7:51 AM

## 2024-08-02 NOTE — Progress Notes (Signed)
 Alerted by central monitoring that patient had 14 beat run of Vtach.  Patient assessed to be resting in bed, asymptomatic, VSS.  Dr Cleatus made aware, no new orders at this time.

## 2024-08-02 NOTE — Discharge Instructions (Signed)
 Home Health services set up with [facility] for PT/OT. Start of care will be 24 to 48 hours after discharge.

## 2024-08-02 NOTE — Progress Notes (Signed)
 Dr Kandis indicated that it would be fine to delay the discharge til 08/03/2024 in the am

## 2024-08-02 NOTE — Progress Notes (Signed)
 Telephone call to pt's brother, Sabas Frett, listed as emergency contact; Vicenta advised that he is currently at work and unable to get off to come and get the pt for discharge at this time; his plan was to get him in the am and take him to his apartment (lives by himself), he was going to watch him and see if he would be able to stay by himself; if not, he will take him to his house to stay with him and his wife; advised Vicenta that I would relay his concerns and plan for the patient to the MD and call him back with the plan

## 2024-08-02 NOTE — Progress Notes (Signed)
 Telephone call to Chris George advised him that Dr Kandis said it would be okay for the pt to discharge in the am

## 2024-08-03 ENCOUNTER — Other Ambulatory Visit: Payer: Self-pay | Admitting: *Deleted

## 2024-08-03 DIAGNOSIS — G6289 Other specified polyneuropathies: Secondary | ICD-10-CM | POA: Diagnosis not present

## 2024-08-03 DIAGNOSIS — C184 Malignant neoplasm of transverse colon: Secondary | ICD-10-CM

## 2024-08-03 LAB — BPAM RBC
Blood Product Expiration Date: 202510172359
ISSUE DATE / TIME: 202509271327
Unit Type and Rh: 202510172359
Unit Type and Rh: 5100

## 2024-08-03 LAB — TYPE AND SCREEN
ABO/RH(D): O POS
Antibody Screen: NEGATIVE
Unit division: 0

## 2024-08-03 LAB — PREPARE RBC (CROSSMATCH)

## 2024-08-03 NOTE — Progress Notes (Signed)
 Patient refusing to have the Na Barb drip secondary to the frequent alarm beeping air in line.  Explained to patient the reason for the drip but he stated he need to sleep and the alarm was keeping him awake.  Notified Dr Cleatus and she recommended, since patient is refusing treatment to give him a break until 0700.  I explained this option to patient and also refused to have it restarted  at 0700. Stating he would discuss with MD in am. Drip stopped at 0330.

## 2024-08-03 NOTE — Progress Notes (Signed)
 Patient discharged per order. DC instructions reviewed with patient and family member.  Port deaccessed and IV  removed pressure held for approximately 5 minutes.

## 2024-08-03 NOTE — Plan of Care (Signed)
   Problem: Education: Goal: Knowledge of General Education information will improve Description: Including pain rating scale, medication(s)/side effects and non-pharmacologic comfort measures Outcome: Progressing   Problem: Health Behavior/Discharge Planning: Goal: Ability to manage health-related needs will improve Outcome: Progressing   Problem: Activity: Goal: Risk for activity intolerance will decrease Outcome: Progressing   Problem: Coping: Goal: Level of anxiety will decrease Outcome: Progressing   Problem: Pain Managment: Goal: General experience of comfort will improve and/or be controlled Outcome: Progressing

## 2024-08-04 ENCOUNTER — Inpatient Hospital Stay

## 2024-08-04 ENCOUNTER — Ambulatory Visit: Admitting: Oncology

## 2024-08-04 ENCOUNTER — Inpatient Hospital Stay: Admitting: Oncology

## 2024-08-04 ENCOUNTER — Other Ambulatory Visit

## 2024-08-04 ENCOUNTER — Ambulatory Visit

## 2024-08-04 ENCOUNTER — Inpatient Hospital Stay: Admitting: Hospice and Palliative Medicine

## 2024-08-04 VITALS — BP 92/57 | HR 55 | Temp 97.6°F | Resp 16 | Wt 194.4 lb

## 2024-08-04 DIAGNOSIS — C184 Malignant neoplasm of transverse colon: Secondary | ICD-10-CM

## 2024-08-04 DIAGNOSIS — Z5111 Encounter for antineoplastic chemotherapy: Secondary | ICD-10-CM | POA: Diagnosis not present

## 2024-08-04 LAB — CMP (CANCER CENTER ONLY)
ALT: 22 U/L (ref 0–44)
AST: 27 U/L (ref 15–41)
Albumin: 3.3 g/dL — ABNORMAL LOW (ref 3.5–5.0)
Alkaline Phosphatase: 271 U/L — ABNORMAL HIGH (ref 38–126)
Anion gap: 6 (ref 5–15)
BUN: 66 mg/dL — ABNORMAL HIGH (ref 8–23)
CO2: 21 mmol/L — ABNORMAL LOW (ref 22–32)
Calcium: 8.8 mg/dL — ABNORMAL LOW (ref 8.9–10.3)
Chloride: 102 mmol/L (ref 98–111)
Creatinine: 3.64 mg/dL — ABNORMAL HIGH (ref 0.61–1.24)
GFR, Estimated: 17 mL/min — ABNORMAL LOW (ref >60)
Glucose, Bld: 144 mg/dL — ABNORMAL HIGH (ref 70–99)
Potassium: 4.4 mmol/L (ref 3.5–5.1)
Sodium: 129 mmol/L — ABNORMAL LOW (ref 135–145)
Total Bilirubin: 1.2 mg/dL (ref 0.0–1.2)
Total Protein: 6.9 g/dL (ref 6.5–8.1)

## 2024-08-04 LAB — MAGNESIUM: Magnesium: 1.8 mg/dL (ref 1.7–2.4)

## 2024-08-04 LAB — CBC WITH DIFFERENTIAL (CANCER CENTER ONLY)
Abs Immature Granulocytes: 0.58 K/uL — ABNORMAL HIGH (ref 0.00–0.07)
Basophils Absolute: 0 K/uL (ref 0.0–0.1)
Basophils Relative: 0 %
Eosinophils Absolute: 0.3 K/uL (ref 0.0–0.5)
Eosinophils Relative: 2 %
HCT: 24.6 % — ABNORMAL LOW (ref 39.0–52.0)
Hemoglobin: 8 g/dL — ABNORMAL LOW (ref 13.0–17.0)
Immature Granulocytes: 5 %
Lymphocytes Relative: 4 %
Lymphs Abs: 0.4 K/uL — ABNORMAL LOW (ref 0.7–4.0)
MCH: 29.3 pg (ref 26.0–34.0)
MCHC: 32.5 g/dL (ref 30.0–36.0)
MCV: 90.1 fL (ref 80.0–100.0)
Monocytes Absolute: 1 K/uL (ref 0.1–1.0)
Monocytes Relative: 9 %
Neutro Abs: 9.1 K/uL — ABNORMAL HIGH (ref 1.7–7.7)
Neutrophils Relative %: 80 %
Platelet Count: 80 K/uL — ABNORMAL LOW (ref 150–400)
RBC: 2.73 MIL/uL — ABNORMAL LOW (ref 4.22–5.81)
RDW: 19.3 % — ABNORMAL HIGH (ref 11.5–15.5)
WBC Count: 11.4 K/uL — ABNORMAL HIGH (ref 4.0–10.5)
nRBC: 0 % (ref 0.0–0.2)

## 2024-08-04 LAB — SAMPLE TO BLOOD BANK

## 2024-08-04 NOTE — Progress Notes (Signed)
 Admitted last week to hospital for c diff, left then readmitted on 07/31/24.  Pt has a 16 lbs weight gain since in clinic last week.  Pt currently holding bumex  and spirolactone per hospital discharge summary. Pt still on vancomycin . Denies diarrhea.

## 2024-08-04 NOTE — Progress Notes (Signed)
 No iv fluids today per Sidra, NP

## 2024-08-04 NOTE — Progress Notes (Signed)
 Symptom Management Clinic [facility] at [facility] Telephone:([phone]) [phone] Fax:(336) [phone]  Patient Care Team: Jeffie Cheryl BRAVO, MD as PCP - General (Family Medicine) [name], MD as Consulting Physician (Oncology) Lane Arthea BRAVO, MD as Referring Physician (Neurology) Lateef, Munsoor, MD as Consulting Physician (Nephrology) Jama Margery ORN, MD as Referring Physician (Cardiology) Lenn Aran, MD as Consulting Physician (Radiation Oncology)   NAME OF PATIENT: Chris George  [id number]  1956/08/12   DATE OF VISIT: 08/04/24  REASON FOR CONSULT: Chris George is a 68 y.o. male with multiple medical problems including recurrent stage IIIc colon cancer, smoldering myeloma, and prostate cancer.  History of stroke with residual expressive aphasia.  INTERVAL HISTORY: Patient was hospitalized 07/29/2024 to 07/30/2024 and then 07/31/2024 to 08/03/2024.  Patient with recent C. difficile and EAEC diarrhea.  He was discharged from hospital 9/25 on Dificid  and then readmitted with bilateral severe leg pain, thought secondary to peripheral neuropathy.  Patient discharged from the hospital yesterday.  Presents to [facility] today for follow-up.  Patient reports he is feeling much improved.  He says diarrhea has resolved.  His appetite is increased.  He says that his leg pain has improved on gabapentin .  He is more ambulatory.  Denies chest pain or shortness of breath.  Reports mild pedal edema.  No orthopnea.  Denies recent fevers or illnesses. Denies any easy bleeding or bruising. Reports weight loss. Denies chest pain. Denies any nausea, vomiting. Denies urinary complaints. Patient offers no further specific complaints today.   PAST MEDICAL HISTORY: Past Medical History:  Diagnosis Date   A-fib (HCC)    Acute kidney injury superimposed on chronic kidney disease 11/19/2023   Acute on chronic heart failure (HCC) 11/19/2023   Acute on chronic heart failure with  preserved ejection fraction (HCC) 11/19/2023   Anemia    Arthritis    ankles   Cancer (HCC)    multiple myeloma   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HCC) 10/26/2019   Cardiomyopathy (HCC)    Chemotherapy-induced peripheral neuropathy    CHF (congestive heart failure) (HCC)    Depression    Dysrhythmia    atrial fib   Elevated troponin 11/19/2023   History of CVA (cerebrovascular accident)    HLD (hyperlipidemia)    Hypercholesteremia    Hypertension    Moderate tricuspid insufficiency    Multiple myeloma (HCC)    not being treated right now per pt 11/11/19   Pneumonia    Priapism 07/25/2012   Sleep apnea    No CPAP.  OSA resolved with wt loss.    PAST SURGICAL HISTORY:  Past Surgical History:  Procedure Laterality Date   ATRIAL ABLATION SURGERY     CARDIAC SURGERY     A-Fib Ablations   COLONOSCOPY N/A 03/17/2024   Procedure: COLONOSCOPY;  Surgeon: Jinny Carmine, MD;  Location: [facility] ENDOSCOPY;  Service: Endoscopy;  Laterality: N/A;   COLONOSCOPY WITH PROPOFOL  N/A 10/26/2019   Procedure: COLONOSCOPY WITH BIOPSY;  Surgeon: Jinny Carmine, MD;  Location: [facility] SURGERY CNTR;  Service: Endoscopy;  Laterality: N/A;   COLONOSCOPY WITH PROPOFOL  N/A 09/03/2022   Procedure: COLONOSCOPY WITH PROPOFOL ;  Surgeon: Jinny Carmine, MD;  Location: [facility] SURGERY CNTR;  Service: Endoscopy;  Laterality: N/A;   ESOPHAGOGASTRODUODENOSCOPY N/A 03/17/2024   Procedure: GI Bleeding;  Surgeon: Jinny Carmine, MD;  Location: [facility] ENDOSCOPY;  Service: Endoscopy;  Laterality: N/A;  GI Bleeding, EGD   ESOPHAGOGASTRODUODENOSCOPY (EGD) WITH PROPOFOL  N/A 10/26/2019   Procedure: ESOPHAGOGASTRODUODENOSCOPY (EGD) WITH BIOPSY;  Surgeon: Jinny Carmine, MD;  Location: [facility] SURGERY CNTR;  Service: Endoscopy;  Laterality: N/A;  sleep apnea   LAPAROSCOPIC PARTIAL COLECTOMY Right 11/17/2019   Procedure: LAPAROSCOPIC PARTIAL COLECTOMY RIGHT EXTENDED;  Surgeon: Desiderio Schanz, MD;  Location: [facility] ORS;  Service: General;  Laterality:  Right;   PORTACATH PLACEMENT N/A 12/28/2019   Procedure: INSERTION PORT-A-CATH Left subclavian;  Surgeon: Desiderio Schanz, MD;  Location: [facility] ORS;  Service: General;  Laterality: N/A;    HEMATOLOGY/ONCOLOGY HISTORY:  Oncology History  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HCC)  10/27/2019 Initial Diagnosis   Cancer of transverse colon (HCC)   12/01/2019 Cancer Staging   Staging form: Colon and Rectum, AJCC 8th Edition - Clinical stage from 12/01/2019: Stage IIIC (cT3, cN2b, cM0) - Signed by [name], MD on 12/01/2019   12/30/2019 - 06/03/2020 Chemotherapy   Patient is on Treatment Plan : COLORECTAL FOLFOX q14d x 6 months     05/15/2023 - 04/30/2024 Chemotherapy   Patient is on Treatment Plan : COLORECTAL FOLFIRI q14d     05/12/2024 -  Chemotherapy   Patient is on Treatment Plan : COLORECTAL FOLFIRI q14d       ALLERGIES:  has no known allergies.  MEDICATIONS:  Current Outpatient Medications  Medication Sig Dispense Refill   atorvastatin  (LIPITOR) 80 MG tablet Take 80 mg by mouth daily.     calcitRIOL  (ROCALTROL ) 0.25 MCG capsule Take 0.25 mcg by mouth daily.     colchicine  0.6 MG tablet Take 1 tablet (0.6 mg total) by mouth daily as needed. Until flare resolves.     apixaban  (ELIQUIS ) 5 MG TABS tablet Take 5 mg by mouth 2 (two) times daily.     [Paused] bumetanide  (BUMEX ) 2 MG tablet Take 1.5 mg by mouth daily. Taking one in the morning. (Patient not taking: Reported on 08/04/2024)     gabapentin  (NEURONTIN ) 100 MG capsule Take 2 capsules (200 mg total) by mouth 2 (two) times daily. 60 capsule 1   hydrocortisone  (ANUSOL -HC) 2.5 % rectal cream Place 1 Application rectally 2 (two) times daily. 30 g 0   lidocaine -prilocaine  (EMLA ) cream Apply 1 Application topically daily.     magnesium  chloride (SLOW-MAG) 64 MG TBEC SR tablet Take 1 tablet (64 mg total) by mouth daily. 60 tablet 1   ondansetron  (ZOFRAN ) 8 MG tablet Take 1 tablet (8 mg total) by mouth every 8 (eight) hours as needed for  nausea or vomiting. 20 tablet 0   oxyCODONE -acetaminophen  (PERCOCET/ROXICET) 5-325 MG tablet Take 1-2 tablets by mouth every 6 (six) hours as needed for severe pain (pain score 7-10). 60 tablet 0   phenylephrine -shark liver oil-mineral oil-petrolatum (HEMORRHOIDAL) 0.25-14-74.9 % rectal ointment Place 1 Application rectally 2 (two) times daily as needed for hemorrhoids. (Patient not taking: Reported on 07/31/2024) 30 g 0   prochlorperazine  (COMPAZINE ) 10 MG tablet Take 1 tablet (10 mg total) by mouth every 6 (six) hours as needed for nausea or vomiting. 30 tablet 0   sodium bicarbonate 650 MG tablet Take 1 tablet (650 mg total) by mouth 2 (two) times daily. 120 tablet 1   [Paused] spironolactone (ALDACTONE) 25 MG tablet Take 1 tablet by mouth every morning. (Patient not taking: Reported on 08/04/2024)     tamsulosin  (FLOMAX ) 0.4 MG CAPS capsule Take 1 capsule (0.4 mg total) by mouth daily after supper. 30 capsule 6   vancomycin  (VANCOCIN ) 125 MG capsule Take 1 capsule (125 mg total) by mouth 4 (four) times daily for 10 days. 40 capsule 0  Vibegron  (GEMTESA ) 75 MG TABS Take 1 tablet (75 mg total) by mouth daily for 28 days.     No current facility-administered medications for this visit.   Facility-Administered Medications Ordered in Other Visits  Medication Dose Route Frequency Provider Last Rate Last Admin   heparin  lock flush 100 unit/mL  500 Units Intravenous Once Finnegan, Timothy J, MD        VITAL SIGNS: There were no vitals taken for this visit. There were no vitals filed for this visit.   Estimated body mass index is 26.37 kg/m as calculated from the following:   Height as of 07/31/24: 5' 9 (1.753 m).   Weight as of 08/03/24: 178 lb 9.2 oz (81 kg).  LABS: CBC:    Component Value Date/Time   WBC 11.4 (H) 08/04/2024 1316   WBC 4.8 08/02/2024 0342   HGB 8.0 (L) 08/04/2024 1316   HGB 14.3 05/13/2013 1116   HCT 24.6 (L) 08/04/2024 1316   HCT 42.9 05/13/2013 1116   PLT 80 (L)  08/04/2024 1316   PLT 169 05/13/2013 1116   MCV 90.1 08/04/2024 1316   MCV 84 05/13/2013 1116   NEUTROABS 9.1 (H) 08/04/2024 1316   NEUTROABS 2.3 05/13/2013 1116   LYMPHSABS 0.4 (L) 08/04/2024 1316   LYMPHSABS 0.9 (L) 05/13/2013 1116   MONOABS 1.0 08/04/2024 1316   MONOABS 0.4 05/13/2013 1116   EOSABS 0.3 08/04/2024 1316   EOSABS 0.1 05/13/2013 1116   BASOSABS 0.0 08/04/2024 1316   BASOSABS 0.0 05/13/2013 1116   Comprehensive Metabolic Panel:    Component Value Date/Time   NA 129 (L) 08/04/2024 1316   NA 143 05/13/2013 1116   K 4.4 08/04/2024 1316   K 3.5 05/13/2013 1116   CL 102 08/04/2024 1316   CL 102 05/13/2013 1116   CO2 21 (L) 08/04/2024 1316   CO2 32 05/13/2013 1116   BUN 66 (H) 08/04/2024 1316   BUN 14 05/13/2013 1116   CREATININE 3.64 (H) 08/04/2024 1316   CREATININE 1.53 (H) 05/13/2013 1116   GLUCOSE 144 (H) 08/04/2024 1316   GLUCOSE 122 (H) 05/13/2013 1116   CALCIUM  8.8 (L) 08/04/2024 1316   CALCIUM  8.6 05/13/2013 1116   AST 27 08/04/2024 1316   ALT 22 08/04/2024 1316   ALT 37 07/24/2012 1915   ALKPHOS 271 (H) 08/04/2024 1316   ALKPHOS 129 07/24/2012 1915   BILITOT 1.2 08/04/2024 1316   PROT 6.9 08/04/2024 1316   PROT 8.4 (H) 07/24/2012 1915   ALBUMIN 3.3 (L) 08/04/2024 1316   ALBUMIN 4.0 07/24/2012 1915    RADIOGRAPHIC STUDIES: MR LUMBAR SPINE WO CONTRAST Result Date: 08/02/2024 CLINICAL DATA:  Initial evaluation for lower extremity pain and weakness. 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 EXAM: MRI LUMBAR SPINE WITHOUT CONTRAST TECHNIQUE: Multiplanar, multisequence MR imaging of the lumbar spine was performed. No intravenous contrast was administered. COMPARISON:  Prior PET-CT from 04/14/2024. FINDINGS: Segmentation: Standard. Lowest well-formed disc space labeled the L5-S1 level. Alignment: Trace facet mediated anterolisthesis of L4 on L5. Alignment otherwise normal preservation of the normal lumbar lordosis. Vertebrae:  Vertebral body height maintained without acute or chronic fracture. Diffusely decreased T1 signal intensity within the visualized bone marrow, with relatively preserved fatty marrow within the L2 and L3 vertebral bodies. Findings likely reflect post radiation changes. No other discrete or worrisome osseous lesions to suggest metastatic disease or active myeloma. No other significant abnormal marrow edema. Conus medullaris and cauda equina: Conus extends to the L1 level.  Conus and cauda equina appear normal. Paraspinal and other soft tissues: Scattered anasarca with stranding within the visualized abdomen and pelvis, similar to prior. Disc levels: L1-2: Normal interspace. Mild bilateral facet hypertrophy. No canal or foraminal stenosis. L2-3: Mild intervertebral disc space narrowing. Disc desiccation with mild disc bulge. Mild bilateral facet hypertrophy. No spinal stenosis. Foramina remain patent. L3-4: Disc desiccation with mild circumferential disc bulge. Mild bilateral facet hypertrophy. No spinal stenosis. Foramina remain patent. L4-5: Trace anterolisthesis. Disc desiccation with mild disc bulge. Superimposed tiny central disc protrusion minimally indents the ventral thecal sac (series 12, image 23). Mild to moderate bilateral facet hypertrophy with trace joint effusions. No significant spinal stenosis. Mild to moderate bilateral L4 foraminal narrowing. L5-S1: Mild left-sided endplate spurring without significant disc bulge. Mild bilateral facet hypertrophy. Mild epidural lipomatosis. No spinal stenosis. Mild bilateral L5 foraminal narrowing. IMPRESSION: 1. No acute abnormality within the lumbar spine. 2. Diffusely decreased T1 signal intensity within the visualized bone marrow, with relatively preserved fatty marrow changes within the L2 and L3 vertebral bodies. Findings likely reflect post XRT changes. No other evidence for metastatic disease or active myeloma within the lumbar spine. 3. Mild multilevel  degenerative disc disease and facet hypertrophy as above. No significant spinal stenosis. Mild to moderate bilateral L4 and L5 foraminal narrowing related to disc bulge and facet hypertrophy. Electronically Signed   By: Morene Hoard M.D.   On: 08/02/2024 02:34   DG Foot Complete Left Result Date: 07/31/2024 CLINICAL DATA:  pain EXAM: LEFT FOOT - COMPLETE 3+ VIEW COMPARISON:  None Available. FINDINGS: No acute fracture or dislocation. There is no evidence of arthropathy or other focal bone abnormality. Soft tissues are unremarkable. No radiopaque foreign body. IMPRESSION: No acute fracture or dislocation. Electronically Signed   By: Rogelia Myers M.D.   On: 07/31/2024 15:19   DG Foot Complete Right Result Date: 07/31/2024 CLINICAL DATA:  pain EXAM: RIGHT FOOT COMPLETE - 3+ VIEW COMPARISON:  None Available. FINDINGS: No acute fracture or dislocation. There is no evidence of arthropathy or other focal bone abnormality. Soft tissues are unremarkable. No radiopaque foreign body. IMPRESSION: No acute fracture or dislocation. Electronically Signed   By: Rogelia Myers M.D.   On: 07/31/2024 15:18   CT ABDOMEN PELVIS WO CONTRAST Result Date: 07/29/2024 EXAM: CT ABDOMEN AND PELVIS WITHOUT CONTRAST 07/29/2024 07:15:56 PM TECHNIQUE: CT of the abdomen and pelvis was performed without the administration of intravenous contrast. Multiplanar reformatted images are provided for review. Automated exposure control, iterative reconstruction, and/or weight-based adjustment of the mA/kV was utilized to reduce the radiation dose to as low as reasonably achievable. COMPARISON: PET/CT dated 04/14/2024. CLINICAL HISTORY: Abdominal pain, acute, nonlocalized. Sent by PCP after positive stool sample for C-diff. Patient has hemorrhoids that cause him rectal pain. Reports he has liver cancer. Currently receives chemo every three weeks. History of Prostate cancer, colon cancer and multiple myeloma. Reports history of stroke.  FINDINGS: LOWER CHEST: Cardiomegaly. Linear scarring and atelectasis in the left lower lobe. LIVER: Nodular hepatic contour. Possible subtle liver metastases on PET are not evident on this non-contrast CT. GALLBLADDER AND BILE DUCTS: Layering gallstones versus gallbladder sludge (image 37) without convincing findings to suggest acute cholecystitis. No biliary ductal dilatation. SPLEEN: No acute abnormality. PANCREAS: No acute abnormality. ADRENAL GLANDS: No acute abnormality. KIDNEYS, URETERS AND BLADDER: No stones in the kidneys or ureters. No hydronephrosis. No perinephric or periureteral stranding. Associated bladder wall thickening, favoring sequelae of chronic bilateral obstruction, although mild perivesical stranding raises the  possibility of cystitis (image 71). GI AND BOWEL: Stomach demonstrates no acute abnormality. Status post right hemicolectomy. There is no bowel obstruction. PERITONEUM AND RETROPERITONEUM: Mild upper abdominal and mesenteric fluid/ascites. No free air. VASCULATURE: Atherosclerotic calcifications of the abdominal aorta and branch vessels. Aorta is normal in caliber. LYMPH NODES: 2.1 cm short axis right retrocaval nodal metastasis (image 34), similar to recent PET. 13 mm short axis right retrocrural nodal metastasis (image 19), mildly progressive . REPRODUCTIVE ORGANS: Prostatomegaly, suggesting BPH. BONES AND SOFT TISSUES: Mild degenerative changes of the thoracolumbar spine. No acute osseous abnormality. No focal soft tissue abnormality. IMPRESSION: 1. Status post right hemicolectomy. Retrocrural and para-aortic nodal metastases, mildly progressive. 2. Suspected cirrhosis. Suspected subtle hepatic metastases are not well visualized. Mild ascites. 3. Prostatomegaly, suggesting BPH. 4. Possible cystitis, equivocal. Electronically signed by: Pinkie Pebbles MD 07/29/2024 07:35 PM EDT RP Workstation: [id number]     PERFORMANCE STATUS (ECOG) : 2 - Symptomatic, <50% confined to  bed  Review of Systems Unless otherwise noted, a complete review of systems is negative.  Physical Exam General: NAD Cardiovascular: regular rate and rhythm, no JVD distention Pulmonary: clear anterior/posterior fields Abdomen: soft, nontender, + bowel sounds GU: no suprapubic tenderness Extremities: mild pedal edema, no joint deformities Skin: no rashes Neurological: Weakness, chronic expressive aphasia  IMPRESSION/PLAN: Colorectal cancer -on treatment FOLFIRI.    Prostate cancer -followed by urology  Myeloma -on surveillance  Diarrhea -finish course of oral vancomycin .  Diarrhea appears to be resolving.  AKI/CKD -serum creatinine increased from 2.6 to 3.64.  Recommend avoiding nephrotoxic medications.  Patient says that he has follow-up with nephrology next week.  Discussed option of ED but patient declined.  Hyponatremia -sodium 129 today.  Patient's weight has increased 17 pounds over the past 1.5 weeks.  Suspect hypervolemic hyponatremia.  Patient reports that his diuretics were held in the hospital.  No overt signs of heart failure at present.  Patient says that he has been in communication with his cardiologist at [facility] who advised resuming diuretics.  Patient says he has follow-up already scheduled with cardiology for next week.  MD follow-up next week.  Case and plan discussed with Dr. Jacobo  Patient expressed understanding and was in agreement with this plan. He also understands that He can call clinic at any time with any questions, concerns, or complaints.   Thank you for allowing me to participate in the care of this very pleasant patient.   Time Total: 20 minutes  Visit consisted of counseling and education dealing with the complex and emotionally intense issues of symptom management in the setting of serious illness.Greater than 50%  of this time was spent counseling and coordinating care related to the above assessment and plan.  Signed by: Fonda Mower, PhD,  NP-C

## 2024-08-06 ENCOUNTER — Encounter

## 2024-08-10 ENCOUNTER — Other Ambulatory Visit: Payer: Self-pay | Admitting: *Deleted

## 2024-08-10 DIAGNOSIS — D649 Anemia, unspecified: Secondary | ICD-10-CM

## 2024-08-10 NOTE — Telephone Encounter (Signed)
 Patient is requesting the following refill Requested Prescriptions   Pending Prescriptions Disp Refills  . ELIQUIS  5 mg Tab [Pharmacy Med Name: Eliquis  5 MG Oral Tablet] 60 tablet 0    Sig: Take 1 tablet by mouth twice daily    Recent Visits Date Type Provider Dept  06/08/24 Office Visit Jama Old, MD [facility]  01/29/24 Office Visit Jama Old, MD [facility]  12/31/23 Office Visit Jama Old, MD [facility]  12/17/23 Office Visit Jama Old, MD [facility]  11/19/23 Office Visit Jama Old, MD [facility]  10/22/23 Office Visit Jama Old, MD [facility]  10/16/23 Office Visit Jama Old, MD [facility]  Showing recent visits within past 365 days and meeting all other requirements Future Appointments Date Type Provider Dept  09/08/24 Appointment Jama Old, MD [facility]  Showing future appointments within next 365 days and meeting all other requirements    Labs: Not applicable this refill

## 2024-08-10 NOTE — Progress Notes (Signed)
 Chris George is a 68 y.o. male that comes today for the following problem(s):   Chief Complaint  Patient presents with  . 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  He was admitted on 07/31/2024 and discharged on 08/03/2024.  He presented to the emergency room with severe pain in bilateral feet/ankles that left him unable to support his weight.  He was treated with gabapentin  with improvement in the pain.  He had also been previously admitted a few days prior and diagnosed with C. difficile colitis.  He was initially attempted to be treated with Dificid  but this was changed to oral vancomycin  due to cost.  He was found to be anemic and was given 1 unit of blood during this hospital stay.  He was also started on bicarbonate.  His Bumex  and spironolactone were paused but have been since restarted.  He notes he gained 20 pounds while he was off of these medications.  He denies chest pain, unusual shortness of breath or palpitations.  He has follow-up scheduled with cardiology and nephrology.  He reports his blood sugars have been well-controlled.  He remains on his Eliquis  for anticoagulation due to his atrial fibrillation.  He notes his pain is improved on the left but persists on the right.    Goals     . * Patient Goals (pt-stated)      Working on speech         Patient Active Problem List  Diagnosis  . Hypertension, essential, benign  . Hyperlipidemia  . Anticoagulant long-term use  . Type 2 diabetes mellitus without complication (CMS/HHS-HCC)  . Pure hypercholesterolemia  . Chronic a-fib (HCC)  . OSA (obstructive sleep apnea)  . Moderate tricuspid insufficiency  . Enlarged heart  . Cardiomyopathy, idiopathic (CMS/HHS-HCC)  . Arthritis of left ankle  . Abnormal ECG  . Neuropathy  . Numbness  . Difficulty walking  . Chemotherapy-induced peripheral neuropathy  (HHS-HCC)  . Port-A-Cath in place  . Cancer of transverse colon (CMS/HHS-HCC)  . History of CVA (cerebrovascular accident)     Past Medical History:  Diagnosis Date  . Anticoagulant long-term use 04/29/2014  . Atrial fibrillation (CMS/HHS-HCC)   . Cardiomyopathy, idiopathic (CMS/HHS-HCC) 01/07/2018  . CHF (congestive heart failure) (CMS/HHS-HCC)   . Chronic a-fib (HCC) 12/06/2015  . Colon cancer (CMS/HHS-HCC)   . COVID-19 07/2020  . CVA (cerebral vascular accident) (CMS/HHS-HCC) 04/05/2023   MRI to have acute infarct in the left parietal lobe and punctate infarct in right temporal lobe  . Depression   . DM (diabetes mellitus) (CMS/HHS-HCC)   . Enlarged heart 01/01/2017  . HTN (hypertension)   . Hyperlipidemia 04/29/2014  . Hypertension, essential, benign 04/26/2014  . Moderate tricuspid insufficiency 01/01/2017  . Multiple myeloma (CMS/HHS-HCC)    Smoldering.  . OSA (obstructive sleep apnea) 01/01/2017   Not using cpap  . Pneumonia   . Prostate cancer (CMS/HHS-HCC)   . Pure hypercholesterolemia 02/09/2015  . Type 2 diabetes mellitus without complication (CMS/HHS-HCC) 02/09/2015     Past Surgical History:  Procedure Laterality Date  . EGD  10/26/2019  . COLONOSCOPY  10/27/2019   Transverse colon ca  . LAPAROSCOPIC PARTIAL RIGHT COLECTOMY  WITH ANASTOMOSIS  11/17/2019   laparoscopic extended right colectomy  . INSERTION CENTRAL VENOUS ACCESS DEVICE W/ SUBCUTANEOUS PORT  12/28/2019  . COLONOSCOPY  09/03/2022  . atrial ablation  Social History   Socioeconomic History  . Marital status: Divorced  Occupational History  . Occupation: Pharmacist, hospital  Tobacco Use  . Smoking status: Never  . Smokeless tobacco: Never  Vaping Use  . Vaping status: Never Used  Substance and Sexual Activity  . Alcohol use: No  . Drug use: No  Social History Narrative   Lives alone in apartment at [employer].   Social Drivers of Corporate investment banker Strain: Low Risk   (08/10/2024)   Overall Financial Resource Strain (CARDIA)   . Difficulty of Paying Living Expenses: Not hard at all  Recent Concern: Financial Resource Strain - Medium Risk (06/09/2024)   Overall Financial Resource Strain (CARDIA)   . Difficulty of Paying Living Expenses: Somewhat hard  Food Insecurity: No Food Insecurity (08/10/2024)   Hunger Vital Sign   . Worried About Programme researcher, broadcasting/film/video in the Last Year: Never true   . Ran Out of Food in the Last Year: Never true  Recent Concern: Food Insecurity - Food Insecurity Present (08/01/2024)   [facility]   Hunger Vital Sign   . Within the past 12 months, you worried that your food would run out before you got the money to buy more.: Sometimes true   . Within the past 12 months, the food you bought just didn't last and you didn't have money to get more.: Sometimes true  Transportation Needs: No Transportation Needs (08/10/2024)   PRAPARE - Transportation   . Lack of Transportation (Medical): No   . Lack of Transportation (Non-Medical): No  Physical Activity: Inactive (11/08/2023)   Received from [facility]   Exercise Vital Sign   . On average, how many days per week do you engage in moderate to strenuous exercise (like a brisk walk)?: 0 days   . On average, how many minutes do you engage in exercise at this level?: 0 min  Stress: Stress Concern Present (05/28/2019)   Received from [facility] - Occupational Stress Questionnaire   . Feeling of Stress : To some extent  Social Connections: Unknown (08/01/2024)   Received from [facility]   Social Connection and Isolation Panel   . In a typical week, how many times do you talk on the phone with family, friends, or neighbors?: More than three times a week   . How often do you get together with friends or relatives?: More than three times a week   . Do you belong to any clubs or organizations such as church groups, unions, fraternal or athletic  groups, or school groups?: Yes   . Are you married, widowed, divorced, separated, never married, or living with a partner?: Divorced  Housing Stability: Low Risk  (08/10/2024)   Housing Stability Vital Sign   . Unable to Pay for Housing in the Last Year: No   . Number of Times Moved in the Last Year: 0   . Homeless in the Last Year: No    Family History  Problem Relation Name Age of Onset  . Obesity Mother Daeshawn Redmann   . High blood pressure (Hypertension) Mother Alioune Hodgkin   . Hyperlipidemia (Elevated cholesterol) Mother Rian Busche   . Diabetes type II Mother Franklin Baumbach   . Prostate cancer Other    . Diabetes Other    . High blood pressure (Hypertension) Other    . Parkinsonism Other       No Known Allergies  Prior to  Admission medications  Medication Sig Taking? Last Dose  apixaban  (ELIQUIS ) 5 mg tablet Take 5 mg by mouth 2 (two) times daily Yes Taking  atorvastatin  (LIPITOR) 80 MG tablet Take 1 tablet by mouth once daily Yes Taking  bumetanide  (BUMEX ) 2 MG tablet Take 2 mg by mouth 2 (two) times daily Yes Taking  calcitRIOL  (ROCALTROL ) 0.25 MCG capsule Take 0.25 mcg by mouth once daily Yes Taking  CHERRY JUICE ORAL Take by mouth every other day Yes Taking  colchicine  (COLCRYS ) 0.6 mg tablet Take 1 tablet (0.6 mg total) by mouth once daily Yes PRN Not Currently Taking  gabapentin  (NEURONTIN ) 100 MG capsule Take 200 mg by mouth 2 (two) times daily Yes Taking  lidocaine -prilocaine  (EMLA ) cream Apply topically as needed Yes Taking  loperamide (IMODIUM A-D) 2 mg tablet Take by mouth Yes PRN Not Currently Taking  magnesium  oxide (MAG-OX) 400 mg (241.3 mg magnesium ) tablet Take 400 mg by mouth once daily Yes Taking  potassium bicarbonate (KLOR-CON /EF) 25 MEQ disintegrating tablet DISSOLVE 1 TABLET BY MOUTH TWICE DAILY Yes Taking  sodium bicarbonate 650 MG tablet Take 650 mg by mouth 2 (two) times daily Yes Taking  spironolactone (ALDACTONE) 25 MG tablet Take 1  tablet by mouth once daily Yes Taking  tamsulosin  (FLOMAX ) 0.4 mg capsule Take 0.4 mg by mouth once daily Yes Taking  vancomycin  (VANCOCIN ) 125 MG capsule  Yes Taking  vibegron  (GEMTESA ) 75 mg Tab Take 75 mg by mouth Yes Taking  ZINC ORAL Take by mouth sometimes Yes Taking     Objective:  BP 90/54 (BP Location: Left upper arm, Patient Position: Sitting, BP Cuff Size: Large Adult)   Pulse (!) 48   Ht 175.3 cm (5' 9)   Wt 89.8 kg (198 lb)   SpO2 98%   BMI 29.24 kg/m   Physical Examination:  GENERAL:  The patient is alert, oriented and in no acute distress.  HEENT:  Head is normocephalic/atraumatic.  Pupils equal, round and reactive to light and accommodation.  Extraocular movements intact.  Nose and throat are clear. NECK/LYMPHATICS:  Supple without thyromegaly or lymphadenopathy.   RESPIRATORY:  Chest wall is within normal limits.   Clear to auscultation and percussion.   CARDIAC:  Regular rate and rhythm, normal S1 and S2 without murmurs, rubs or gallops.   VASCULAR:  Distal pulses 2+.   GASTROINTESTINAL  Soft. No organomegaly or tenderness found.    MUSCULOSKELETAL: 2+ pitting edema bilateral lower extremity NEUROLOGIC:  The patient is alert and oriented x 3.  No focal deficits.       A/P   Diagnoses and all orders for this visit:  Chemotherapy-induced neuropathy (HHS-HCC) - uncontrolled  Type 2 diabetes mellitus with stage 4 chronic kidney disease, without long-term current use of insulin  (CMS/HHS-HCC) - stable  Cardiomyopathy, idiopathic (CMS/HHS-HCC) - stable  Chronic a-fib (HCC) - stable  C. difficile colitis - improving  Depression screening (Z13.31) -     Depression Screen -(PHQ- 2/9, BDI)  Continue current medications - renewed as needed and patient instructed to have the pharmacy contact our office between visits for refills Counseled regarding diet and exercise - encourage to make healthy food choices and sensible portion sizes.  Encouraged regular  aerobic activity 3-4 days per week. Reviewed most recent labs with patient.   Continue to follow with specialist, most recent cardiology, nephrology and oncology notes reviewed.  Most recent hospitalizations reviewed Encouraged patient to elevate legs more and wear compression stockings to help with swelling  Return  for As Scheduled.     *Some images could not be shown.

## 2024-08-11 ENCOUNTER — Other Ambulatory Visit: Payer: Self-pay | Admitting: *Deleted

## 2024-08-11 ENCOUNTER — Inpatient Hospital Stay (HOSPITAL_BASED_OUTPATIENT_CLINIC_OR_DEPARTMENT_OTHER): Admitting: Oncology

## 2024-08-11 ENCOUNTER — Inpatient Hospital Stay: Attending: Oncology

## 2024-08-11 ENCOUNTER — Encounter: Payer: Self-pay | Admitting: Oncology

## 2024-08-11 ENCOUNTER — Ambulatory Visit

## 2024-08-11 ENCOUNTER — Other Ambulatory Visit

## 2024-08-11 ENCOUNTER — Ambulatory Visit: Admitting: Oncology

## 2024-08-11 ENCOUNTER — Inpatient Hospital Stay

## 2024-08-11 VITALS — BP 120/88 | HR 60 | Temp 97.9°F | Resp 18 | Ht 69.0 in | Wt 198.0 lb

## 2024-08-11 VITALS — BP 90/54 | HR 51

## 2024-08-11 DIAGNOSIS — N189 Chronic kidney disease, unspecified: Secondary | ICD-10-CM | POA: Insufficient documentation

## 2024-08-11 DIAGNOSIS — D696 Thrombocytopenia, unspecified: Secondary | ICD-10-CM | POA: Diagnosis not present

## 2024-08-11 DIAGNOSIS — Z79634 Long term (current) use of topoisomerase inhibitor: Secondary | ICD-10-CM | POA: Diagnosis not present

## 2024-08-11 DIAGNOSIS — D472 Monoclonal gammopathy: Secondary | ICD-10-CM | POA: Diagnosis not present

## 2024-08-11 DIAGNOSIS — C189 Malignant neoplasm of colon, unspecified: Secondary | ICD-10-CM | POA: Diagnosis present

## 2024-08-11 DIAGNOSIS — Z800 Family history of malignant neoplasm of digestive organs: Secondary | ICD-10-CM | POA: Insufficient documentation

## 2024-08-11 DIAGNOSIS — C787 Secondary malignant neoplasm of liver and intrahepatic bile duct: Secondary | ICD-10-CM | POA: Diagnosis present

## 2024-08-11 DIAGNOSIS — C184 Malignant neoplasm of transverse colon: Secondary | ICD-10-CM

## 2024-08-11 DIAGNOSIS — Z5111 Encounter for antineoplastic chemotherapy: Secondary | ICD-10-CM | POA: Insufficient documentation

## 2024-08-11 DIAGNOSIS — Z79631 Long term (current) use of antimetabolite agent: Secondary | ICD-10-CM | POA: Insufficient documentation

## 2024-08-11 DIAGNOSIS — D649 Anemia, unspecified: Secondary | ICD-10-CM

## 2024-08-11 DIAGNOSIS — C61 Malignant neoplasm of prostate: Secondary | ICD-10-CM | POA: Diagnosis not present

## 2024-08-11 LAB — CBC WITH DIFFERENTIAL (CANCER CENTER ONLY)
Abs Immature Granulocytes: 0.05 K/uL (ref 0.00–0.07)
Basophils Absolute: 0 K/uL (ref 0.0–0.1)
Basophils Relative: 0 %
Eosinophils Absolute: 0.2 K/uL (ref 0.0–0.5)
Eosinophils Relative: 3 %
HCT: 23.1 % — ABNORMAL LOW (ref 39.0–52.0)
Hemoglobin: 7.4 g/dL — ABNORMAL LOW (ref 13.0–17.0)
Immature Granulocytes: 1 %
Lymphocytes Relative: 4 %
Lymphs Abs: 0.3 K/uL — ABNORMAL LOW (ref 0.7–4.0)
MCH: 28.9 pg (ref 26.0–34.0)
MCHC: 32 g/dL (ref 30.0–36.0)
MCV: 90.2 fL (ref 80.0–100.0)
Monocytes Absolute: 0.8 K/uL (ref 0.1–1.0)
Monocytes Relative: 12 %
Neutro Abs: 5.1 K/uL (ref 1.7–7.7)
Neutrophils Relative %: 80 %
Platelet Count: 73 K/uL — ABNORMAL LOW (ref 150–400)
RBC: 2.56 MIL/uL — ABNORMAL LOW (ref 4.22–5.81)
RDW: 18.8 % — ABNORMAL HIGH (ref 11.5–15.5)
WBC Count: 6.4 K/uL (ref 4.0–10.5)
nRBC: 0 % (ref 0.0–0.2)

## 2024-08-11 LAB — CMP (CANCER CENTER ONLY)
ALT: 14 U/L (ref 0–44)
AST: 24 U/L (ref 15–41)
Albumin: 3.2 g/dL — ABNORMAL LOW (ref 3.5–5.0)
Alkaline Phosphatase: 238 U/L — ABNORMAL HIGH (ref 38–126)
Anion gap: 8 (ref 5–15)
BUN: 71 mg/dL — ABNORMAL HIGH (ref 8–23)
CO2: 23 mmol/L (ref 22–32)
Calcium: 8.7 mg/dL — ABNORMAL LOW (ref 8.9–10.3)
Chloride: 105 mmol/L (ref 98–111)
Creatinine: 3.29 mg/dL — ABNORMAL HIGH (ref 0.61–1.24)
GFR, Estimated: 20 mL/min — ABNORMAL LOW (ref >60)
Glucose, Bld: 99 mg/dL (ref 70–99)
Potassium: 4.5 mmol/L (ref 3.5–5.1)
Sodium: 136 mmol/L (ref 135–145)
Total Bilirubin: 1 mg/dL (ref 0.0–1.2)
Total Protein: 6.9 g/dL (ref 6.5–8.1)

## 2024-08-11 LAB — PREPARE RBC (CROSSMATCH)

## 2024-08-11 LAB — MAGNESIUM: Magnesium: 1.5 mg/dL — ABNORMAL LOW (ref 1.7–2.4)

## 2024-08-11 MED ORDER — SODIUM CHLORIDE 0.9 % IV SOLN
2160.0000 mg/m2 | INTRAVENOUS | Status: DC
  Administered 2024-08-11: 4300 mg via INTRAVENOUS
  Filled 2024-08-11: qty 86

## 2024-08-11 MED ORDER — SODIUM CHLORIDE 0.9 % IV SOLN
160.0000 mg/m2 | Freq: Once | INTRAVENOUS | Status: AC
  Administered 2024-08-11: 300 mg via INTRAVENOUS
  Filled 2024-08-11: qty 15

## 2024-08-11 MED ORDER — DEXAMETHASONE SODIUM PHOSPHATE 10 MG/ML IJ SOLN
10.0000 mg | Freq: Once | INTRAMUSCULAR | Status: AC
  Administered 2024-08-11: 10 mg via INTRAVENOUS
  Filled 2024-08-11: qty 1

## 2024-08-11 MED ORDER — SODIUM CHLORIDE 0.9 % IV SOLN
INTRAVENOUS | Status: DC
  Filled 2024-08-11: qty 250

## 2024-08-11 MED ORDER — FLUOROURACIL CHEMO INJECTION 2.5 GM/50ML
360.0000 mg/m2 | Freq: Once | INTRAVENOUS | Status: AC
  Administered 2024-08-11: 700 mg via INTRAVENOUS
  Filled 2024-08-11: qty 14

## 2024-08-11 MED ORDER — OXYCODONE HCL 5 MG PO TABS
5.0000 mg | ORAL_TABLET | Freq: Once | ORAL | Status: AC
  Administered 2024-08-11: 5 mg via ORAL
  Filled 2024-08-11: qty 1

## 2024-08-11 MED ORDER — SODIUM CHLORIDE 0.9 % IV SOLN
360.0000 mg/m2 | Freq: Once | INTRAVENOUS | Status: AC
  Administered 2024-08-11: 716 mg via INTRAVENOUS
  Filled 2024-08-11: qty 25

## 2024-08-11 MED ORDER — PALONOSETRON HCL INJECTION 0.25 MG/5ML
0.2500 mg | Freq: Once | INTRAVENOUS | Status: AC
  Administered 2024-08-11: 0.25 mg via INTRAVENOUS
  Filled 2024-08-11: qty 5

## 2024-08-11 MED ORDER — ATROPINE SULFATE 1 MG/ML IV SOLN
0.5000 mg | Freq: Once | INTRAVENOUS | Status: AC | PRN
  Administered 2024-08-11: 0.5 mg via INTRAVENOUS
  Filled 2024-08-11: qty 1

## 2024-08-11 NOTE — Progress Notes (Signed)
 [facility]  Telephone:(336) [phone] Fax:(336) [phone]  ID: Chris George OB: 10/28/1956  MR#: [id number]  RDW#:[id number]  Patient Care Team: Jeffie Cheryl FORBES, MD as PCP - General (Family Medicine) [name], MD as Consulting Physician (Oncology) [name], MD as Referring Physician (Neurology) Marcelino Gales, MD as Consulting Physician (Nephrology) Jama Margery ORN, MD as Referring Physician (Cardiology) Lenn Aran, MD as Consulting Physician (Radiation Oncology)  CHIEF COMPLAINT: Recurrent colon cancer, smoldering myeloma, prostate cancer.  INTERVAL HISTORY: Patient returns to clinic today for further evaluation and consideration of his next cycle of FOLFIRI.  He continues to have chronic weakness and fatigue.  His neuropathy pain has improved with gabapentin . He has a mild expressive aphasia, but no other neurologic complaints.  He denies any recent fevers.  He has no chest pain, cough, shortness of breath, or hemoptysis.  He denies any nausea, vomiting, constipation, or diarrhea.  He has no further melena or hematochezia.  He has no urinary complaints.  Patient offers no further specific complaints today.  REVIEW OF SYSTEMS:   Review of Systems  Constitutional:  Positive for malaise/fatigue. Negative for fever and weight loss.  Respiratory: Negative.  Negative for cough, hemoptysis and shortness of breath.   Cardiovascular: Negative.  Negative for chest pain and leg swelling.  Gastrointestinal: Negative.  Negative for blood in stool, diarrhea, melena, nausea and vomiting.  Genitourinary: Negative.  Negative for frequency and hematuria.  Musculoskeletal: Negative.  Negative for back pain and joint pain.  Skin: Negative.  Negative for rash.  Neurological:  Positive for tingling, sensory change and weakness. Negative for dizziness, focal weakness and headaches.  Psychiatric/Behavioral: Negative.  Negative for depression and hallucinations. The  patient is not nervous/anxious.   Bilateral  As per HPI. Otherwise, a complete review of systems is negative.  PAST MEDICAL HISTORY: Past Medical History:  Diagnosis Date   A-fib (HCC)    Acute kidney injury superimposed on chronic kidney disease 11/19/2023   Acute on chronic heart failure (HCC) 11/19/2023   Acute on chronic heart failure with preserved ejection fraction (HCC) 11/19/2023   Anemia    Arthritis    ankles   Cancer (HCC)    multiple myeloma   Cancer of transverse colon (HCC) 10/26/2019   Cardiomyopathy (HCC)    Chemotherapy-induced peripheral neuropathy    CHF (congestive heart failure) (HCC)    Depression    Dysrhythmia    atrial fib   Elevated troponin 11/19/2023   History of CVA (cerebrovascular accident)    HLD (hyperlipidemia)    Hypercholesteremia    Hypertension    Moderate tricuspid insufficiency    Multiple myeloma (HCC)    not being treated right now per pt 11/11/19   Pneumonia    Priapism 07/25/2012   Sleep apnea    No CPAP.  OSA resolved with wt loss.    PAST SURGICAL HISTORY: Past Surgical History:  Procedure Laterality Date   ATRIAL ABLATION SURGERY     CARDIAC SURGERY     A-Fib Ablations   COLONOSCOPY N/A 03/17/2024   Procedure: COLONOSCOPY;  Surgeon: Jinny Carmine, MD;  Location: [facility] ENDOSCOPY;  Service: Endoscopy;  Laterality: N/A;   COLONOSCOPY WITH PROPOFOL  N/A 10/26/2019   Procedure: COLONOSCOPY WITH BIOPSY;  Surgeon: Jinny Carmine, MD;  Location: [facility] SURGERY CNTR;  Service: Endoscopy;  Laterality: N/A;   COLONOSCOPY WITH PROPOFOL  N/A 09/03/2022   Procedure: COLONOSCOPY WITH PROPOFOL ;  Surgeon: Jinny Carmine, MD;  Location: [name] SURGERY CNTR;  Service: Endoscopy;  Laterality: N/A;   ESOPHAGOGASTRODUODENOSCOPY N/A 03/17/2024   Procedure: GI Bleeding;  Surgeon: Jinny Carmine, MD;  Location: [facility] ENDOSCOPY;  Service: Endoscopy;  Laterality: N/A;  GI Bleeding, EGD   ESOPHAGOGASTRODUODENOSCOPY (EGD) WITH PROPOFOL  N/A 10/26/2019    Procedure: ESOPHAGOGASTRODUODENOSCOPY (EGD) WITH BIOPSY;  Surgeon: Jinny Carmine, MD;  Location: [facility]ERY [facility];  Service: Endoscopy;  Laterality: N/A;  sleep apnea   LAPAROSCOPIC PARTIAL COLECTOMY Right 11/17/2019   Procedure: LAPAROSCOPIC PARTIAL COLECTOMY RIGHT EXTENDED;  Surgeon: Desiderio Schanz, MD;  Location: [facility] ORS;  Service: General;  Laterality: Right;   PORTACATH PLACEMENT N/A 12/28/2019   Procedure: INSERTION PORT-A-CATH Left subclavian;  Surgeon: Desiderio Schanz, MD;  Location: [facility] ORS;  Service: General;  Laterality: N/A;    FAMILY HISTORY: Family History  Problem Relation Age of Onset   Healthy Mother     ADVANCED DIRECTIVES (Y/N):  N  HEALTH MAINTENANCE: Social History   Tobacco Use   Smoking status: Never    Passive exposure: Never   Smokeless tobacco: Never  Vaping Use   Vaping status: Never Used  Substance Use Topics   Alcohol use: Not Currently   Drug use: Never     Colonoscopy:  PAP:  Bone density:  Lipid panel:  No Known Allergies  Current Outpatient Medications  Medication Sig Dispense Refill   apixaban  (ELIQUIS ) 5 MG TABS tablet Take 5 mg by mouth 2 (two) times daily.     atorvastatin  (LIPITOR) 80 MG tablet Take 80 mg by mouth daily.     bumetanide  (BUMEX ) 2 MG tablet Take 1.5 mg by mouth daily. Taking one in the morning.     calcitRIOL  (ROCALTROL ) 0.25 MCG capsule Take 0.25 mcg by mouth daily.     colchicine  0.6 MG tablet Take 1 tablet (0.6 mg total) by mouth daily as needed. Until flare resolves.     gabapentin  (NEURONTIN ) 100 MG capsule Take 2 capsules (200 mg total) by mouth 2 (two) times daily. 60 capsule 1   hydrocortisone  (ANUSOL -HC) 2.5 % rectal cream Place 1 Application rectally 2 (two) times daily. 30 g 0   lidocaine -prilocaine  (EMLA ) cream Apply 1 Application topically daily.     magnesium  chloride (SLOW-MAG) 64 MG TBEC SR tablet Take 1 tablet (64 mg total) by mouth daily. 60 tablet 1   ondansetron  (ZOFRAN ) 8 MG tablet Take 1 tablet (8  mg total) by mouth every 8 (eight) hours as needed for nausea or vomiting. 20 tablet 0   oxyCODONE -acetaminophen  (PERCOCET/ROXICET) 5-325 MG tablet Take 1-2 tablets by mouth every 6 (six) hours as needed for severe pain (pain score 7-10). 60 tablet 0   phenylephrine -shark liver oil-mineral oil-petrolatum (HEMORRHOIDAL) 0.25-14-74.9 % rectal ointment Place 1 Application rectally 2 (two) times daily as needed for hemorrhoids. 30 g 0   prochlorperazine  (COMPAZINE ) 10 MG tablet Take 1 tablet (10 mg total) by mouth every 6 (six) hours as needed for nausea or vomiting. 30 tablet 0   sodium bicarbonate 650 MG tablet Take 1 tablet (650 mg total) by mouth 2 (two) times daily. 120 tablet 1   [Paused] spironolactone (ALDACTONE) 25 MG tablet Take 1 tablet by mouth every morning.     tamsulosin  (FLOMAX ) 0.4 MG CAPS capsule Take 1 capsule (0.4 mg total) by mouth daily after supper. 30 capsule 6   Vibegron  (GEMTESA ) 75 MG TABS Take 1 tablet (75 mg total) by mouth daily for 28 days.     No current facility-administered medications for this visit.   Facility-Administered Medications Ordered in Other Visits  Medication Dose Route Frequency Provider Last Rate Last Admin   heparin  lock flush 100 unit/mL  500 Units Intravenous Once Kristie Bracewell J, MD        OBJECTIVE: Vitals:   08/11/24 0906  BP: 120/88  Pulse: 60  Resp: 18  Temp: 97.9 F (36.6 C)  SpO2: 100%       Body mass index is 29.24 kg/m.    ECOG FS:1 - Symptomatic but completely ambulatory  General: Well-developed, well-nourished, no acute distress. Eyes: Pink conjunctiva, anicteric sclera. HEENT: Normocephalic, moist mucous membranes. Lungs: No audible wheezing or coughing. Heart: Regular rate and rhythm. Abdomen: Soft, nontender, no obvious distention. Musculoskeletal: No edema, cyanosis, or clubbing. Neuro: Alert, answering all questions appropriately. Cranial nerves grossly intact. Skin: No rashes or petechiae noted. Psych: Normal  affect.  LAB RESULTS:  Lab Results  Component Value Date   NA 129 (L) 08/04/2024   K 4.4 08/04/2024   CL 102 08/04/2024   CO2 21 (L) 08/04/2024   GLUCOSE 144 (H) 08/04/2024   BUN 66 (H) 08/04/2024   CREATININE 3.64 (H) 08/04/2024   CALCIUM  8.8 (L) 08/04/2024   PROT 6.9 08/04/2024   ALBUMIN 3.3 (L) 08/04/2024   AST 27 08/04/2024   ALT 22 08/04/2024   ALKPHOS 271 (H) 08/04/2024   BILITOT 1.2 08/04/2024   GFRNONAA 17 (L) 08/04/2024   GFRAA >60 07/29/2020    Lab Results  Component Value Date   WBC 11.4 (H) 08/04/2024   NEUTROABS 9.1 (H) 08/04/2024   HGB 8.0 (L) 08/04/2024   HCT 24.6 (L) 08/04/2024   MCV 90.1 08/04/2024   PLT 80 (L) 08/04/2024   Lab Results  Component Value Date   IRON 27 (L) 08/01/2024   TIBC 225 (L) 08/01/2024   IRONPCTSAT 12 (L) 08/01/2024   Lab Results  Component Value Date   FERRITIN 1,107 (H) 08/01/2024     STUDIES: MR LUMBAR SPINE WO CONTRAST Result Date: 08/02/2024 CLINICAL DATA:  Initial evaluation for lower extremity pain and weakness. 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 EXAM: MRI LUMBAR SPINE WITHOUT CONTRAST TECHNIQUE: Multiplanar, multisequence MR imaging of the lumbar spine was performed. No intravenous contrast was administered. COMPARISON:  Prior PET-CT from 04/14/2024. FINDINGS: Segmentation: Standard. Lowest well-formed disc space labeled the L5-S1 level. Alignment: Trace facet mediated anterolisthesis of L4 on L5. Alignment otherwise normal preservation of the normal lumbar lordosis. Vertebrae: Vertebral body height maintained without acute or chronic fracture. Diffusely decreased T1 signal intensity within the visualized bone marrow, with relatively preserved fatty marrow within the L2 and L3 vertebral bodies. Findings likely reflect post radiation changes. No other discrete or worrisome osseous lesions to suggest metastatic disease or active myeloma. No other significant abnormal marrow edema. Conus  medullaris and cauda equina: Conus extends to the L1 level. Conus and cauda equina appear normal. Paraspinal and other soft tissues: Scattered anasarca with stranding within the visualized abdomen and pelvis, similar to prior. Disc levels: L1-2: Normal interspace. Mild bilateral facet hypertrophy. No canal or foraminal stenosis. L2-3: Mild intervertebral disc space narrowing. Disc desiccation with mild disc bulge. Mild bilateral facet hypertrophy. No spinal stenosis. Foramina remain patent. L3-4: Disc desiccation with mild circumferential disc bulge. Mild bilateral facet hypertrophy. No spinal stenosis. Foramina remain patent. L4-5: Trace anterolisthesis. Disc desiccation with mild disc bulge. Superimposed tiny central disc protrusion minimally indents the ventral thecal sac (series 12, image 23). Mild to moderate bilateral facet hypertrophy with trace joint effusions. No significant spinal stenosis. Mild  to moderate bilateral L4 foraminal narrowing. L5-S1: Mild left-sided endplate spurring without significant disc bulge. Mild bilateral facet hypertrophy. Mild epidural lipomatosis. No spinal stenosis. Mild bilateral L5 foraminal narrowing. IMPRESSION: 1. No acute abnormality within the lumbar spine. 2. Diffusely decreased T1 signal intensity within the visualized bone marrow, with relatively preserved fatty marrow changes within the L2 and L3 vertebral bodies. Findings likely reflect post XRT changes. No other evidence for metastatic disease or active myeloma within the lumbar spine. 3. Mild multilevel degenerative disc disease and facet hypertrophy as above. No significant spinal stenosis. Mild to moderate bilateral L4 and L5 foraminal narrowing related to disc bulge and facet hypertrophy. Electronically Signed   By: Morene Hoard M.D.   On: 08/02/2024 02:34   DG Foot Complete Left Result Date: 07/31/2024 CLINICAL DATA:  pain EXAM: LEFT FOOT - COMPLETE 3+ VIEW COMPARISON:  None Available. FINDINGS: No  acute fracture or dislocation. There is no evidence of arthropathy or other focal bone abnormality. Soft tissues are unremarkable. No radiopaque foreign body. IMPRESSION: No acute fracture or dislocation. Electronically Signed   By: Rogelia Myers M.D.   On: 07/31/2024 15:19   DG Foot Complete Right Result Date: 07/31/2024 CLINICAL DATA:  pain EXAM: RIGHT FOOT COMPLETE - 3+ VIEW COMPARISON:  None Available. FINDINGS: No acute fracture or dislocation. There is no evidence of arthropathy or other focal bone abnormality. Soft tissues are unremarkable. No radiopaque foreign body. IMPRESSION: No acute fracture or dislocation. Electronically Signed   By: Rogelia Myers M.D.   On: 07/31/2024 15:18   CT ABDOMEN PELVIS WO CONTRAST Result Date: 07/29/2024 EXAM: CT ABDOMEN AND PELVIS WITHOUT CONTRAST 07/29/2024 07:15:56 PM TECHNIQUE: CT of the abdomen and pelvis was performed without the administration of intravenous contrast. Multiplanar reformatted images are provided for review. Automated exposure control, iterative reconstruction, and/or weight-based adjustment of the mA/kV was utilized to reduce the radiation dose to as low as reasonably achievable. COMPARISON: PET/CT dated 04/14/2024. CLINICAL HISTORY: Abdominal pain, acute, nonlocalized. Sent by PCP after positive stool sample for C-diff. Patient has hemorrhoids that cause him rectal pain. Reports he has liver cancer. Currently receives chemo every three weeks. History of Prostate cancer, colon cancer and multiple myeloma. Reports history of stroke. FINDINGS: LOWER CHEST: Cardiomegaly. Linear scarring and atelectasis in the left lower lobe. LIVER: Nodular hepatic contour. Possible subtle liver metastases on PET are not evident on this non-contrast CT. GALLBLADDER AND BILE DUCTS: Layering gallstones versus gallbladder sludge (image 37) without convincing findings to suggest acute cholecystitis. No biliary ductal dilatation. SPLEEN: No acute abnormality. PANCREAS:  No acute abnormality. ADRENAL GLANDS: No acute abnormality. KIDNEYS, URETERS AND BLADDER: No stones in the kidneys or ureters. No hydronephrosis. No perinephric or periureteral stranding. Associated bladder wall thickening, favoring sequelae of chronic bilateral obstruction, although mild perivesical stranding raises the possibility of cystitis (image 71). GI AND BOWEL: Stomach demonstrates no acute abnormality. Status post right hemicolectomy. There is no bowel obstruction. PERITONEUM AND RETROPERITONEUM: Mild upper abdominal and mesenteric fluid/ascites. No free air. VASCULATURE: Atherosclerotic calcifications of the abdominal aorta and branch vessels. Aorta is normal in caliber. LYMPH NODES: 2.1 cm short axis right retrocaval nodal metastasis (image 34), similar to recent PET. 13 mm short axis right retrocrural nodal metastasis (image 19), mildly progressive . REPRODUCTIVE ORGANS: Prostatomegaly, suggesting BPH. BONES AND SOFT TISSUES: Mild degenerative changes of the thoracolumbar spine. No acute osseous abnormality. No focal soft tissue abnormality. IMPRESSION: 1. Status post right hemicolectomy. Retrocrural and para-aortic nodal metastases, mildly progressive. 2.  Suspected cirrhosis. Suspected subtle hepatic metastases are not well visualized. Mild ascites. 3. Prostatomegaly, suggesting BPH. 4. Possible cystitis, equivocal. Electronically signed by: Pinkie Pebbles MD 07/29/2024 07:35 PM EDT RP Workstation: [id number]     ONCOLOGY HISTORY: Patient completed cycle 9 of adjuvant FOLFOX on June 03, 2020.  Given his difficulties with treatment and declining performance status, treatment was discontinued altogether. Colonoscopy on September 03, 2022 did not reveal any significant pathology and recommendation was to repeat in 3 years.    ASSESSMENT: Recurrent colon cancer, smoldering myeloma, prostate cancer.  PLAN:    Recurrent colon cancer: PET scan results from December 30, 2023 reviewed independently  with progressive disease of hypermetabolic retrocaval mass, but no other evidence of metastatic disease.  Patient has completed SBRT to this lesion.  Repeat PET scan on April 14, 2024 reviewed independently with continued progression of disease now with evidence of liver metastasis.  Since reinitiating treatment, patient CEA has trended down to 245, but his most recent result trended up slightly to 272.  Proceed with cycle 15 of dose reduced FOLFIRI today.  Return to clinic in 2 days for pump removal, GCF support, and blood transfusion.  Patient will then return to clinic in 3 weeks for further evaluation and consideration of cycle 16.  Will repeat PET scan prior to next treatment. Prostate cancer: Gleason's 3+4.  Patient's PSA increased and peaked at 39.34, but since completing treatment it continues to trend down.  His most recent result is 1.61.  Nuclear medicine bone scan on December 13, 2022 was reported as negative.  PSMA PET per radiation oncology from Mar 21, 2023 did not reveal metastatic disease.  Patient completed XRT for local control disease on May 30, 2023.  Can consider Eligard in the future.  Continue follow-up with urology as indicated.   Smoldering myeloma: Chronic and unchanged.  Patient was initially diagnosed and treated in [place], [place]  and treated with single agent Velcade in approximately 2013.  He declined maintenance treatment or referral for bone marrow transplant.  His M spike has ranged from 1.1-2.0 since July 2020.  His most recent result from April 28, 2024 is 1.8.  Over the same timeframe his IgG component has ranged from [phone].  His most recent result is 2635.  Finally, his kappa free light chains have ranged from 24.8 -75.3.  His most recent result was elevated at 98.5, but his lambda free light chains are also elevated.  His kappa/lambda free light chain ratio was within normal limits.  Nuclear med bone scan as above.  His most recent bone marrow biopsy completed on May 28, 2019 revealed only 10 to 15% plasma cells with no clonality reported.  Cytogenetics were also reported as normal.    Anemia: Hemoglobin stable at 7.4 despite recent transfusion.  Patient will receive 1 additional unit of blood with his pump removal.   Neutropenia: Resolved.  Continue G-CSF support with pump off for future treatments.  Continue with dose reduced FOLFIRI as above.  Thrombocytopenia: Platelets 73 today.  Proceed cautiously with treatment as above.   Renal insufficiency: Chronic and unchanged.  Patient's GFR is 20 today.  Neither irinotecan  or 5-FU need to be dosed reduced in renal insufficiency.  Continue follow-up with nephrology as scheduled. Hypomagnesia: Mild, monitor. Peripheral neuropathy: Improved with gabapentin .  Continue Lyrica  50 mg daily and was previously given a referral to neurology.   Hypotension: Resolved. Coping/anxiety: Patient was previously given a referral to [facility].  CHF exacerbation/edema: Resolved.  Patient was recently at [facility] for nearly a month.  Continue follow-up with cardiology and nephrology.  Diarrhea: Resolved.  Continue Imodium as needed. Hemorrhoids: Patient does not complain of this today.  Continue OTC remedies as needed. Urinary frequency: Patient was previously given a referral back to urology.  Patient expressed understanding and was in agreement with this plan. He also understands that He can call clinic at any time with any questions, concerns, or complaints.    Evalene JINNY Reusing, MD   08/11/2024 9:18 AM

## 2024-08-11 NOTE — Progress Notes (Signed)
 He is ready to finish his treatment. He is still having trouble with his feet with neuropathy, which he is still taking his gabapentin .

## 2024-08-11 NOTE — Progress Notes (Unsigned)
 08/12/2024 Chris George [id number] Dec 25, 1955  Gastroenterology Office Note    Primary Care Physician:  Jeffie Cheryl FORBES, MD  Primary GI Provider: Jinny Carmine, MD    Chief Complaint   Chief Complaint  Patient presents with   Other    Watery stools- took ABX for several days for infection- good appetite-after meals has diarrhea     History of Present Illness   Chris George is a 68 y.o. male with PMHX of metastatic colon cancer, prostate cancer, A-fib on Eliquis , chemotherapy induced neuropathy, diabetes, stage IV chronic kidney disease presenting today for watery stools due to C. Diff infection.   Patient recently admitted overnight 07/29/2024 for C. Difficile.  He finished antibiotic course on Monday. Patient reports that he was having the watery stools prior to hospitalization, but now diarrhea has resolved.  He did eat a big meal on Sunday including greens, baked potato, with multiple bowel movements that evening, some loose stools.  Otherwise he is having formed stools.  States his appetite is good, but he may not eat as much after having chemotherapy, last infusion yesterday. He denies abdominal pain, melena or hematochezia fever, chills  Patient reports some weight gain and abdomen and extremities due to Bumex  being held, he has now restarted. Per [facility] oncology note yesterday, PET scan on April 14, 2024 showed progression of disease now with evidence of liver metastasis. Has upcoming PET scan on 08/25/2024.    Colonoscopy 03/17/2024 - Stool in the rectum and in the sigmoid colon.  - A single bleeding colonic angiodysplastic lesion. Treated with argon plasma coagulation (APC).  - Non-bleeding internal hemorrhoids.  - No specimens collected.   03/17/2024 EGD Impression: - Normal esophagus. - Normal stomach.  - Normal examined duodenum.  - No specimens collected.    Past Medical History:  Diagnosis Date   A-fib (HCC)    Acute kidney injury superimposed on  chronic kidney disease 11/19/2023   Acute on chronic heart failure (HCC) 11/19/2023   Acute on chronic heart failure with preserved ejection fraction (HCC) 11/19/2023   Anemia    Arthritis    ankles   Cancer (HCC)    multiple myeloma   Cancer of transverse colon (HCC) 10/26/2019   Cardiomyopathy (HCC)    Chemotherapy-induced peripheral neuropathy    CHF (congestive heart failure) (HCC)    Depression    Dysrhythmia    atrial fib   Elevated troponin 11/19/2023   History of CVA (cerebrovascular accident)    HLD (hyperlipidemia)    Hypercholesteremia    Hypertension    Moderate tricuspid insufficiency    Multiple myeloma (HCC)    not being treated right now per pt 11/11/19   Pneumonia    Priapism 07/25/2012   Sleep apnea    No CPAP.  OSA resolved with wt loss.    Past Surgical History:  Procedure Laterality Date   ATRIAL ABLATION SURGERY     CARDIAC SURGERY     A-Fib Ablations   COLONOSCOPY N/A 03/17/2024   Procedure: COLONOSCOPY;  Surgeon: Jinny Carmine, MD;  Location: [facility] ENDOSCOPY;  Service: Endoscopy;  Laterality: N/A;   COLONOSCOPY WITH PROPOFOL  N/A 10/26/2019   Procedure: COLONOSCOPY WITH BIOPSY;  Surgeon: Jinny Carmine, MD;  Location: [facility] SURGERY CNTR;  Service: Endoscopy;  Laterality: N/A;   COLONOSCOPY WITH PROPOFOL  N/A 09/03/2022   Procedure: COLONOSCOPY WITH PROPOFOL ;  Surgeon: Jinny Carmine, MD;  Location: [facility] SURGERY [facility];  Service: Endoscopy;  Laterality: N/A;   ESOPHAGOGASTRODUODENOSCOPY N/A 03/17/2024  Procedure: GI Bleeding;  Surgeon: Jinny Carmine, MD;  Location: [facility] ENDOSCOPY;  Service: Endoscopy;  Laterality: N/A;  GI Bleeding, EGD   ESOPHAGOGASTRODUODENOSCOPY (EGD) WITH PROPOFOL  N/A 10/26/2019   Procedure: ESOPHAGOGASTRODUODENOSCOPY (EGD) WITH BIOPSY;  Surgeon: Jinny Carmine, MD;  Location: [facility] SURGERY CNTR;  Service: Endoscopy;  Laterality: N/A;  sleep apnea   LAPAROSCOPIC PARTIAL COLECTOMY Right 11/17/2019   Procedure: LAPAROSCOPIC PARTIAL COLECTOMY  RIGHT EXTENDED;  Surgeon: Desiderio Schanz, MD;  Location: [facility] ORS;  Service: General;  Laterality: Right;   PORTACATH PLACEMENT N/A 12/28/2019   Procedure: INSERTION PORT-A-CATH Left subclavian;  Surgeon: Desiderio Schanz, MD;  Location: [facility] ORS;  Service: General;  Laterality: N/A;    Current Outpatient Medications  Medication Sig Dispense Refill   apixaban  (ELIQUIS ) 5 MG TABS tablet Take 5 mg by mouth 2 (two) times daily.     atorvastatin  (LIPITOR) 80 MG tablet Take 80 mg by mouth daily.     bumetanide  (BUMEX ) 2 MG tablet Take 1.5 mg by mouth daily. Taking one in the morning.     calcitRIOL  (ROCALTROL ) 0.25 MCG capsule Take 0.25 mcg by mouth daily.     colchicine  0.6 MG tablet Take 1 tablet (0.6 mg total) by mouth daily as needed. Until flare resolves.     gabapentin  (NEURONTIN ) 100 MG capsule Take 2 capsules (200 mg total) by mouth 2 (two) times daily. 60 capsule 1   hydrocortisone  (ANUSOL -HC) 2.5 % rectal cream Place 1 Application rectally 2 (two) times daily. 30 g 0   lidocaine -prilocaine  (EMLA ) cream Apply 1 Application topically daily.     magnesium  chloride (SLOW-MAG) 64 MG TBEC SR tablet Take 1 tablet (64 mg total) by mouth daily. 60 tablet 1   ondansetron  (ZOFRAN ) 8 MG tablet Take 1 tablet (8 mg total) by mouth every 8 (eight) hours as needed for nausea or vomiting. 20 tablet 0   oxyCODONE -acetaminophen  (PERCOCET/ROXICET) 5-325 MG tablet Take 1-2 tablets by mouth every 6 (six) hours as needed for severe pain (pain score 7-10). 60 tablet 0   prochlorperazine  (COMPAZINE ) 10 MG tablet Take 1 tablet (10 mg total) by mouth every 6 (six) hours as needed for nausea or vomiting. 30 tablet 0   sodium bicarbonate 650 MG tablet Take 1 tablet (650 mg total) by mouth 2 (two) times daily. 120 tablet 1   [Paused] spironolactone (ALDACTONE) 25 MG tablet Take 1 tablet by mouth every morning.     tamsulosin  (FLOMAX ) 0.4 MG CAPS capsule Take 1 capsule (0.4 mg total) by mouth daily after supper. 30 capsule 6    Vibegron  (GEMTESA ) 75 MG TABS Take 1 tablet (75 mg total) by mouth daily for 28 days.     No current facility-administered medications for this visit.   Facility-Administered Medications Ordered in Other Visits  Medication Dose Route Frequency Provider Last Rate Last Admin   heparin  lock flush 100 unit/mL  500 Units Intravenous Once Finnegan, Timothy J, MD        Allergies as of 08/12/2024   (No Known Allergies)    Family History  Problem Relation Age of Onset   Healthy Mother    Colon cancer Father    Parkinson's disease Father    Colon cancer Brother     Social History   Socioeconomic History   Marital status: Single    Spouse name: Not on file   Number of children: Not on file   Years of education: Not on file   Highest education level: Not on file  Occupational History  Not on file  Tobacco Use   Smoking status: Never    Passive exposure: Never   Smokeless tobacco: Never  Vaping Use   Vaping status: Never Used  Substance and Sexual Activity   Alcohol use: Not Currently   Drug use: Never   Sexual activity: Yes    Birth control/protection: None  Other Topics Concern   Not on file  Social History Narrative   Not on file   Social Drivers of Health   Financial Resource Strain: Low Risk  (08/10/2024)   Received from [facility]   Overall Financial Resource Strain (CARDIA)    Difficulty of Paying Living Expenses: Not hard at all  Recent Concern: Financial Resource Strain - Medium Risk (06/09/2024)   Received from [facility]   Overall Financial Resource Strain (CARDIA)    Difficulty of Paying Living Expenses: Somewhat hard  Food Insecurity: No Food Insecurity (08/10/2024)   Received from [facility]   Hunger Vital Sign    Within the past 12 months, you worried that your food would run out before you got the money to buy more.: Never true    Within the past 12 months, the food you bought just didn't last  and you didn't have money to get more.: Never true  Recent Concern: Food Insecurity - Food Insecurity Present (08/01/2024)   Hunger Vital Sign    Worried About Running Out of Food in the Last Year: Sometimes true    Ran Out of Food in the Last Year: Sometimes true  Transportation Needs: No Transportation Needs (08/10/2024)   Received from [facility]   PRAPARE - Transportation    In the past 12 months, has lack of transportation kept you from medical appointments or from getting medications?: No    Lack of Transportation (Non-Medical): No  Physical Activity: Inactive (11/08/2023)   Exercise Vital Sign    Days of Exercise per Week: 0 days    Minutes of Exercise per Session: 0 min  Stress: Stress Concern Present (05/28/2019)   [employer] of Occupational Health - Occupational Stress Questionnaire    Feeling of Stress : To some extent  Social Connections: Unknown (08/01/2024)   Social Connection and Isolation Panel    Frequency of Communication with Friends and Family: More than three times a week    Frequency of Social Gatherings with Friends and Family: More than three times a week    Attends Religious Services: Not on Insurance claims handler of Clubs or Organizations: Yes    Attends Banker Meetings: Not on file    Marital Status: Divorced  Intimate Partner Violence: Not At Risk (08/01/2024)   Humiliation, Afraid, Rape, and Kick questionnaire    Fear of Current or Ex-Partner: No    Emotionally Abused: No    Physically Abused: No    Sexually Abused: No     RELEVANT GI HISTORY, IMAGING AND LABS: CBC    Component Value Date/Time   WBC 6.4 08/11/2024 0849   WBC 4.8 08/02/2024 0342   RBC 2.56 (L) 08/11/2024 0849   HGB 7.4 (L) 08/11/2024 0849   HGB 14.3 05/13/2013 1116   HCT 23.1 (L) 08/11/2024 0849   HCT 42.9 05/13/2013 1116   PLT 73 (L) 08/11/2024 0849   PLT 169 05/13/2013 1116   MCV 90.2 08/11/2024 0849   MCV 84 05/13/2013 1116   MCH 28.9  08/11/2024 0849   MCHC 32.0 08/11/2024 0849  RDW 18.8 (H) 08/11/2024 0849   RDW 13.9 05/13/2013 1116   LYMPHSABS 0.3 (L) 08/11/2024 0849   LYMPHSABS 0.9 (L) 05/13/2013 1116   MONOABS 0.8 08/11/2024 0849   MONOABS 0.4 05/13/2013 1116   EOSABS 0.2 08/11/2024 0849   EOSABS 0.1 05/13/2013 1116   BASOSABS 0.0 08/11/2024 0849   BASOSABS 0.0 05/13/2013 1116   Recent Labs    07/14/24 0831 07/21/24 0834 07/28/24 1448 07/29/24 1519 07/30/24 0553 07/31/24 1510 08/01/24 0514 08/02/24 0342 08/04/24 1316 08/11/24 0849  HGB 7.1* 7.3* 7.2* 8.0* 7.1* 7.8* 6.6* 8.1* 8.0* 7.4*    CMP     Component Value Date/Time   NA 136 08/11/2024 0849   NA 143 05/13/2013 1116   K 4.5 08/11/2024 0849   K 3.5 05/13/2013 1116   CL 105 08/11/2024 0849   CL 102 05/13/2013 1116   CO2 23 08/11/2024 0849   CO2 32 05/13/2013 1116   GLUCOSE 99 08/11/2024 0849   GLUCOSE 122 (H) 05/13/2013 1116   BUN 71 (H) 08/11/2024 0849   BUN 14 05/13/2013 1116   CREATININE 3.29 (H) 08/11/2024 0849   CREATININE 1.53 (H) 05/13/2013 1116   CALCIUM  8.7 (L) 08/11/2024 0849   CALCIUM  8.6 05/13/2013 1116   PROT 6.9 08/11/2024 0849   PROT 8.4 (H) 07/24/2012 1915   ALBUMIN 3.2 (L) 08/11/2024 0849   ALBUMIN 4.0 07/24/2012 1915   AST 24 08/11/2024 0849   ALT 14 08/11/2024 0849   ALT 37 07/24/2012 1915   ALKPHOS 238 (H) 08/11/2024 0849   ALKPHOS 129 07/24/2012 1915   BILITOT 1.0 08/11/2024 0849   GFRNONAA 20 (L) 08/11/2024 0849   GFRNONAA 50 (L) 05/13/2013 1116   GFRAA >60 07/29/2020 1505   GFRAA 58 (L) 05/13/2013 1116      Latest Ref Rng & Units 08/11/2024    8:49 AM 08/04/2024    1:16 PM 08/01/2024    5:14 AM  Hepatic Function  Total Protein 6.5 - 8.1 g/dL 6.9  6.9  6.8   Albumin 3.5 - 5.0 g/dL 3.2  3.3  3.3   AST 15 - 41 U/L 24  27  15    ALT 0 - 44 U/L 14  22  14    Alk Phosphatase 38 - 126 U/L 238  271  172   Total Bilirubin 0.0 - 1.2 mg/dL 1.0  1.2  1.4       Review of Systems   All systems reviewed and  negative except where noted in HPI.    Physical Exam  BP (!) 92/56   Pulse 71   Temp 98.7 F (37.1 C)   Ht 5' 9 (1.753 m)   Wt 195 lb (88.5 kg)   SpO2 96%   BMI 28.80 kg/m  No LMP for male patient. General:   Alert and oriented. Pleasant and cooperative. Well-nourished and well-developed. In no acute distress. Head:  Normocephalic and atraumatic. Eyes:  Without icterus Ears:  Normal auditory acuity. Lungs:  Respirations even and unlabored.  Clear throughout to auscultation.   No wheezes, crackles, or rhonchi. No acute distress. Heart:  Regular rate and rhythm; no murmurs, clicks, rubs, or gallops. Abdomen:  Normal bowel sounds.  No bruits.  Soft, non-tender and non-distended without masses, hepatosplenomegaly or hernias noted.  No guarding or rebound tenderness.   Rectal:  Deferred. Msk:  Symmetrical without gross deformities. Normal posture. Extremities:  +1 nonpitting edema to BLE.  Neurologic:  Alert and  oriented x4;  grossly normal neurologically. Skin:  Intact without significant lesions or rashes. Psych:  Alert and cooperative. Normal mood and affect.   Assessment & Plan   Chris George is a 68 y.o. male presenting today following hospitalization for C. Difficile.    C. Diff infection: resolved. Finished antiobiotic course on Monday and having formed stools.  - May take Imodium as needed - Can retest for C. difficile if diarrhea returns - Discussed possible recurrence of C. Difficile due to weakened immune system and ways to lower risk  Colon cancer with evidence of liver metastasis.  - PET scan scheduled for 08/25/2024. - Patient to continue chemotherapy and close follow-up with oncology.   Follow-up in 6-8 weeks   Grayce Bohr, DNP, AGNP-C [facility] Gastroenter[facility]

## 2024-08-11 NOTE — Patient Instructions (Signed)
 [facility] - A DEPT OF [facility]  Discharge Instructions: Thank you for choosing [facility] to provide your oncology and hematology care.  If you have a lab appointment with the Cancer Center, please go directly to the Cancer Center and check in at the registration area.  Wear comfortable clothing and clothing appropriate for easy access to any Portacath or PICC line.   We strive to give you quality time with your provider. You may need to reschedule your appointment if you arrive late (15 or more minutes).  Arriving late affects you and other patients whose appointments are after yours.  Also, if you miss three or more appointments without notifying the office, you may be dismissed from the clinic at the provider's discretion.      For prescription refill requests, have your pharmacy contact our office and allow 72 hours for refills to be completed.    Today you received the following chemotherapy and/or immunotherapy agents- irinotecan, leucovorin, 5FU      To help prevent nausea and vomiting after your treatment, we encourage you to take your nausea medication as directed.  BELOW ARE SYMPTOMS THAT SHOULD BE REPORTED IMMEDIATELY: *FEVER GREATER THAN 100.4 F (38 C) OR HIGHER *CHILLS OR SWEATING *NAUSEA AND VOMITING THAT IS NOT CONTROLLED WITH YOUR NAUSEA MEDICATION *UNUSUAL SHORTNESS OF BREATH *UNUSUAL BRUISING OR BLEEDING *URINARY PROBLEMS (pain or burning when urinating, or frequent urination) *BOWEL PROBLEMS (unusual diarrhea, constipation, pain near the anus) TENDERNESS IN MOUTH AND THROAT WITH OR WITHOUT PRESENCE OF ULCERS (sore throat, sores in mouth, or a toothache) UNUSUAL RASH, SWELLING OR PAIN  UNUSUAL VAGINAL DISCHARGE OR ITCHING   Items with * indicate a potential emergency and should be followed up as soon as possible or go to the Emergency Department if any problems should occur.  Please show the CHEMOTHERAPY ALERT CARD  or IMMUNOTHERAPY ALERT CARD at check-in to the Emergency Department and triage nurse.  Should you have questions after your visit or need to cancel or reschedule your appointment, please contact [facility] - A DEPT OF [facility]  [phone] and follow the prompts.  Office hours are 8:00 a.m. to 4:30 p.m. Monday - Friday. Please note that voicemails left after 4:00 p.m. may not be returned until the following business day.  We are closed weekends and major holidays. You have access to a nurse at all times for urgent questions. Please call the main number to the clinic [phone] and follow the prompts.  For any non-urgent questions, you may also contact your provider using MyChart. We now offer e-Visits for anyone 64 and older to request care online for non-urgent symptoms. For details visit [email or web address].   Also download the MyChart app! Go to the app store, search "MyChart", open the app, select [facility], and log in with your MyChart username and password.

## 2024-08-11 NOTE — Progress Notes (Signed)
 Irinotecan  dose of 300mg  under the 10% rule but ok to proceed as patient plts at 73 per pharmacy

## 2024-08-12 ENCOUNTER — Ambulatory Visit: Admitting: Family Medicine

## 2024-08-12 ENCOUNTER — Encounter: Payer: Self-pay | Admitting: Family Medicine

## 2024-08-12 VITALS — BP 92/56 | HR 71 | Temp 98.7°F | Ht 69.0 in | Wt 195.0 lb

## 2024-08-12 DIAGNOSIS — Z8619 Personal history of other infectious and parasitic diseases: Secondary | ICD-10-CM

## 2024-08-12 DIAGNOSIS — C184 Malignant neoplasm of transverse colon: Secondary | ICD-10-CM | POA: Diagnosis not present

## 2024-08-12 DIAGNOSIS — A498 Other bacterial infections of unspecified site: Secondary | ICD-10-CM

## 2024-08-12 LAB — CEA: CEA: 272 ng/mL — ABNORMAL HIGH (ref 0.0–4.7)

## 2024-08-12 NOTE — Patient Instructions (Signed)
 You may take Imodium as needed if you have occasional diarrhea in the future. If your diarrhea gets worse or you are having blood in your stools, contact us  sooner.

## 2024-08-13 ENCOUNTER — Inpatient Hospital Stay

## 2024-08-13 ENCOUNTER — Encounter

## 2024-08-13 DIAGNOSIS — D649 Anemia, unspecified: Secondary | ICD-10-CM

## 2024-08-13 DIAGNOSIS — Z5111 Encounter for antineoplastic chemotherapy: Secondary | ICD-10-CM | POA: Diagnosis not present

## 2024-08-13 MED ORDER — OXYCODONE HCL 5 MG PO TABS
5.0000 mg | ORAL_TABLET | Freq: Once | ORAL | Status: AC
  Administered 2024-08-13: 5 mg via ORAL
  Filled 2024-08-13: qty 1

## 2024-08-13 MED ORDER — SODIUM CHLORIDE 0.9% IV SOLUTION
250.0000 mL | INTRAVENOUS | Status: DC
  Administered 2024-08-13: 100 mL via INTRAVENOUS
  Filled 2024-08-13: qty 250

## 2024-08-13 NOTE — Progress Notes (Signed)
 No need to give Fulphilia shot today per Dr Jacobo

## 2024-08-13 NOTE — Patient Instructions (Signed)

## 2024-08-14 LAB — BPAM RBC
Blood Product Expiration Date: 202510242359
ISSUE DATE / TIME: 202510091305
Unit Type and Rh: 202510242359
Unit Type and Rh: 5100

## 2024-08-14 LAB — TYPE AND SCREEN
ABO/RH(D): O POS
Antibody Screen: NEGATIVE
Unit division: 0

## 2024-08-17 ENCOUNTER — Other Ambulatory Visit: Payer: Self-pay | Admitting: *Deleted

## 2024-08-17 MED ORDER — TAMSULOSIN HCL 0.4 MG PO CAPS: 0.4000 mg | ORAL_CAPSULE | Freq: Every day | ORAL | 6 refills | Status: DC

## 2024-08-21 ENCOUNTER — Other Ambulatory Visit: Payer: Self-pay | Admitting: *Deleted

## 2024-08-21 ENCOUNTER — Inpatient Hospital Stay (HOSPITAL_BASED_OUTPATIENT_CLINIC_OR_DEPARTMENT_OTHER): Admitting: Hospice and Palliative Medicine

## 2024-08-21 ENCOUNTER — Other Ambulatory Visit: Payer: Self-pay

## 2024-08-21 ENCOUNTER — Inpatient Hospital Stay

## 2024-08-21 ENCOUNTER — Encounter: Payer: Self-pay | Admitting: Hospice and Palliative Medicine

## 2024-08-21 VITALS — BP 87/67 | HR 48

## 2024-08-21 DIAGNOSIS — R197 Diarrhea, unspecified: Secondary | ICD-10-CM

## 2024-08-21 DIAGNOSIS — D649 Anemia, unspecified: Secondary | ICD-10-CM

## 2024-08-21 DIAGNOSIS — R531 Weakness: Secondary | ICD-10-CM

## 2024-08-21 DIAGNOSIS — C184 Malignant neoplasm of transverse colon: Secondary | ICD-10-CM

## 2024-08-21 DIAGNOSIS — Z5111 Encounter for antineoplastic chemotherapy: Secondary | ICD-10-CM | POA: Diagnosis not present

## 2024-08-21 DIAGNOSIS — G893 Neoplasm related pain (acute) (chronic): Secondary | ICD-10-CM

## 2024-08-21 DIAGNOSIS — Z85038 Personal history of other malignant neoplasm of large intestine: Secondary | ICD-10-CM

## 2024-08-21 LAB — GASTROINTESTINAL PANEL BY PCR, STOOL (REPLACES STOOL CULTURE)

## 2024-08-21 LAB — CBC WITH DIFFERENTIAL (CANCER CENTER ONLY)
Abs Immature Granulocytes: 0.01 K/uL (ref 0.00–0.07)
Basophils Absolute: 0 K/uL (ref 0.0–0.1)
Basophils Relative: 1 %
Eosinophils Absolute: 0 K/uL (ref 0.0–0.5)
Eosinophils Relative: 2 %
HCT: 21 % — ABNORMAL LOW (ref 39.0–52.0)
Hemoglobin: 6.8 g/dL — CL (ref 13.0–17.0)
Immature Granulocytes: 1 %
Lymphocytes Relative: 11 %
Lymphs Abs: 0.2 K/uL — ABNORMAL LOW (ref 0.7–4.0)
MCH: 28.9 pg (ref 26.0–34.0)
MCHC: 32.4 g/dL (ref 30.0–36.0)
MCV: 89.4 fL (ref 80.0–100.0)
Monocytes Absolute: 0.1 K/uL (ref 0.1–1.0)
Monocytes Relative: 9 %
Neutro Abs: 1.1 K/uL — ABNORMAL LOW (ref 1.7–7.7)
Neutrophils Relative %: 76 %
Platelet Count: 63 K/uL — ABNORMAL LOW (ref 150–400)
RBC: 2.35 MIL/uL — ABNORMAL LOW (ref 4.22–5.81)
RDW: 18 % — ABNORMAL HIGH (ref 11.5–15.5)
WBC Count: 1.5 K/uL — ABNORMAL LOW (ref 4.0–10.5)
nRBC: 0 % (ref 0.0–0.2)

## 2024-08-21 LAB — CMP (CANCER CENTER ONLY)
ALT: 20 U/L (ref 0–44)
AST: 26 U/L (ref 15–41)
Albumin: 3 g/dL — ABNORMAL LOW (ref 3.5–5.0)
Alkaline Phosphatase: 220 U/L — ABNORMAL HIGH (ref 38–126)
Anion gap: 9 (ref 5–15)
BUN: 66 mg/dL — ABNORMAL HIGH (ref 8–23)
CO2: 24 mmol/L (ref 22–32)
Calcium: 8.3 mg/dL — ABNORMAL LOW (ref 8.9–10.3)
Chloride: 103 mmol/L (ref 98–111)
Creatinine: 2.32 mg/dL — ABNORMAL HIGH (ref 0.61–1.24)
GFR, Estimated: 30 mL/min — ABNORMAL LOW (ref >60)
Glucose, Bld: 170 mg/dL — ABNORMAL HIGH (ref 70–99)
Potassium: 4.6 mmol/L (ref 3.5–5.1)
Sodium: 136 mmol/L (ref 135–145)
Total Bilirubin: 1.4 mg/dL — ABNORMAL HIGH (ref 0.0–1.2)
Total Protein: 6.5 g/dL (ref 6.5–8.1)

## 2024-08-21 LAB — C DIFFICILE QUICK SCREEN W PCR REFLEX
C Diff antigen: NEGATIVE
C Diff interpretation: NOT DETECTED
C Diff toxin: NEGATIVE

## 2024-08-21 LAB — MAGNESIUM: Magnesium: 1.5 mg/dL — ABNORMAL LOW (ref 1.7–2.4)

## 2024-08-21 LAB — PREPARE RBC (CROSSMATCH)

## 2024-08-21 MED ORDER — MAGNESIUM SULFATE 2 GM/50ML IV SOLN
2.0000 g | Freq: Once | INTRAVENOUS | Status: AC
  Administered 2024-08-21: 2 g via INTRAVENOUS
  Filled 2024-08-21: qty 50

## 2024-08-21 MED ORDER — VALACYCLOVIR HCL 1 G PO TABS: 1000.0000 mg | ORAL_TABLET | Freq: Every day | ORAL | 0 refills | Status: AC

## 2024-08-21 MED ORDER — SODIUM CHLORIDE 0.9 % IV SOLN
Freq: Once | INTRAVENOUS | Status: AC
  Filled 2024-08-21: qty 250

## 2024-08-21 MED ORDER — OXYCODONE HCL 5 MG PO TABS
5.0000 mg | ORAL_TABLET | ORAL | Status: AC
  Administered 2024-08-21: 5 mg via ORAL
  Filled 2024-08-21: qty 1

## 2024-08-21 NOTE — Progress Notes (Signed)
 Symptom Management Clinic [facility] at [facility] Telephone:([phone] Fax:(336) [phone]  Patient Care Team: Jeffie Cheryl BRAVO, MD as PCP - General (Family Medicine) [name], MD as Consulting Physician (Oncology) Lane Arthea BRAVO, MD as Referring Physician (Neurology) Lateef, Munsoor, MD as Consulting Physician (Nephrology) Jama Margery ORN, MD as Referring Physician (Cardiology) Lenn Aran, MD as Consulting Physician (Radiation Oncology)   NAME OF PATIENT: Chris George  [id number]  [birth year]   DATE OF VISIT: 08/21/24  REASON FOR CONSULT: Chris George is a 68 y.o. male with multiple medical problems including recurrent stage IIIc colon cancer, smoldering myeloma, and prostate cancer.  History of stroke with residual expressive aphasia.  INTERVAL HISTORY: Patient was hospitalized 07/29/2024 to 07/30/2024 and then 07/31/2024 to 08/03/2024.  Patient with recent C. difficile and EAEC diarrhea.  He was discharged from hospital 9/25 on Dificid  and then readmitted with bilateral severe leg pain, thought secondary to peripheral neuropathy.  He has status post cycle 15 FOLFIRI on 08/11/2024.  He had blood transfusion on 08/13/2024.  Patient walked into clinic today requesting to be seen.  Patient reports that he has had 1 week of watery diarrhea.  He reports an average of 7-8 bowel movements daily.  He reports hemorrhoids and says he is having rectal pain and bleeding when he wipes.  He is using hydrocortisone  cream and witch hazel.  Oral intake is reportedly poor.  Patient denies fever or chills.  Denies any easy bleeding or bruising. Reports weight loss. Denies chest pain. Denies any nausea, vomiting. Denies urinary complaints. Patient offers no further specific complaints today.   PAST MEDICAL HISTORY: Past Medical History:  Diagnosis Date  . A-fib (HCC)   . Acute kidney injury superimposed on chronic kidney disease 11/19/2023  . Acute on  chronic heart failure (HCC) 11/19/2023  . Acute on chronic heart failure with preserved ejection fraction (HCC) 11/19/2023  . Anemia   . Arthritis    ankles  . Cancer (HCC)    multiple myeloma  . Cancer of transverse colon (HCC) 10/26/2019  . Cardiomyopathy (HCC)   . Chemotherapy-induced peripheral neuropathy   . CHF (congestive heart failure) (HCC)   . Depression   . Dysrhythmia    atrial fib  . Elevated troponin 11/19/2023  . History of CVA (cerebrovascular accident)   . HLD (hyperlipidemia)   . Hypercholesteremia   . Hypertension   . Moderate tricuspid insufficiency   . Multiple myeloma (HCC)    not being treated right now per pt 11/11/19  . Pneumonia   . Priapism 07/25/2012  . Sleep apnea    No CPAP.  OSA resolved with wt loss.    PAST SURGICAL HISTORY:  Past Surgical History:  Procedure Laterality Date  . ATRIAL ABLATION SURGERY    . CARDIAC SURGERY     A-Fib Ablations  . COLONOSCOPY N/A 03/17/2024   Procedure: COLONOSCOPY;  Surgeon: Jinny Carmine, MD;  Location: [facility] ENDOSCOPY;  Service: Endoscopy;  Laterality: N/A;  . COLONOSCOPY WITH PROPOFOL  N/A 10/26/2019   Procedure: COLONOSCOPY WITH BIOPSY;  Surgeon: Jinny Carmine, MD;  Location: [facility] SURGERY CNTR;  Service: Endoscopy;  Laterality: N/A;  . COLONOSCOPY WITH PROPOFOL  N/A 09/03/2022   Procedure: COLONOSCOPY WITH PROPOFOL ;  Surgeon: Jinny Carmine, MD;  Location: [facility] SURGERY CNTR;  Service: Endoscopy;  Laterality: N/A;  . ESOPHAGOGASTRODUODENOSCOPY N/A 03/17/2024   Procedure: GI Bleeding;  Surgeon: Jinny Carmine, MD;  Location: [facility] ENDOSCOPY;  Service: Endoscopy;  Laterality: N/A;  GI Bleeding, EGD  .  ESOPHAGOGASTRODUODENOSCOPY (EGD) WITH PROPOFOL  N/A 10/26/2019   Procedure: ESOPHAGOGASTRODUODENOSCOPY (EGD) WITH BIOPSY;  Surgeon: Jinny Carmine, MD;  Location: [facility] SURGERY CNTR;  Service: Endoscopy;  Laterality: N/A;  sleep apnea  . LAPAROSCOPIC PARTIAL COLECTOMY Right 11/17/2019   Procedure: LAPAROSCOPIC PARTIAL  COLECTOMY RIGHT EXTENDED;  Surgeon: Desiderio Schanz, MD;  Location: [facility] ORS;  Service: General;  Laterality: Right;  . PORTACATH PLACEMENT N/A 12/28/2019   Procedure: INSERTION PORT-A-CATH Left subclavian;  Surgeon: Desiderio Schanz, MD;  Location: [facility] ORS;  Service: General;  Laterality: N/A;    HEMATOLOGY/ONCOLOGY HISTORY:  Oncology History  Cancer of transverse colon (HCC)  10/27/2019 Initial Diagnosis   Cancer of transverse colon (HCC)   12/01/2019 Cancer Staging   Staging form: Colon and Rectum, AJCC 8th Edition - Clinical stage from 12/01/2019: Stage IIIC (cT3, cN2b, cM0) - Signed by [name], MD on 12/01/2019   12/30/2019 - 06/03/2020 Chemotherapy   Patient is on Treatment Plan : COLORECTAL FOLFOX q14d x 6 months     05/15/2023 - 04/30/2024 Chemotherapy   Patient is on Treatment Plan : COLORECTAL FOLFIRI q14d     05/12/2024 -  Chemotherapy   Patient is on Treatment Plan : COLORECTAL FOLFIRI q14d       ALLERGIES:  has no known allergies.  MEDICATIONS:  Current Outpatient Medications  Medication Sig Dispense Refill  . apixaban  (ELIQUIS ) 5 MG TABS tablet Take 5 mg by mouth 2 (two) times daily.    . atorvastatin  (LIPITOR) 80 MG tablet Take 80 mg by mouth daily.    . bumetanide  (BUMEX ) 2 MG tablet Take 1.5 mg by mouth daily. Taking one in the morning.    . calcitRIOL  (ROCALTROL ) 0.25 MCG capsule Take 0.25 mcg by mouth daily.    . colchicine  0.6 MG tablet Take 1 tablet (0.6 mg total) by mouth daily as needed. Until flare resolves.    . gabapentin  (NEURONTIN ) 100 MG capsule Take 2 capsules (200 mg total) by mouth 2 (two) times daily. 60 capsule 1  . hydrocortisone  (ANUSOL -HC) 2.5 % rectal cream Place 1 Application rectally 2 (two) times daily. 30 g 0  . lidocaine -prilocaine  (EMLA ) cream Apply 1 Application topically daily.    . magnesium  chloride (SLOW-MAG) 64 MG TBEC SR tablet Take 1 tablet (64 mg total) by mouth daily. 60 tablet 1  . ondansetron  (ZOFRAN ) 8 MG tablet Take 1  tablet (8 mg total) by mouth every 8 (eight) hours as needed for nausea or vomiting. 20 tablet 0  . oxyCODONE -acetaminophen  (PERCOCET/ROXICET) 5-325 MG tablet Take 1-2 tablets by mouth every 6 (six) hours as needed for severe pain (pain score 7-10). 60 tablet 0  . prochlorperazine  (COMPAZINE ) 10 MG tablet Take 1 tablet (10 mg total) by mouth every 6 (six) hours as needed for nausea or vomiting. 30 tablet 0  . sodium bicarbonate 650 MG tablet Take 1 tablet (650 mg total) by mouth 2 (two) times daily. 120 tablet 1  . [Paused] spironolactone (ALDACTONE) 25 MG tablet Take 1 tablet by mouth every morning.    . tamsulosin  (FLOMAX ) 0.4 MG CAPS capsule Take 1 capsule (0.4 mg total) by mouth daily after supper. 30 capsule 6   No current facility-administered medications for this visit.   Facility-Administered Medications Ordered in Other Visits  Medication Dose Route Frequency Provider Last Rate Last Admin  . heparin  lock flush 100 unit/mL  500 Units Intravenous Once Finnegan, Timothy J, MD        VITAL SIGNS: There were no vitals taken for  this visit. There were no vitals filed for this visit.   Estimated body mass index is 28.8 kg/m as calculated from the following:   Height as of 08/12/24: 5' 9 (1.753 m).   Weight as of 08/12/24: 195 lb (88.5 kg).  LABS: CBC:    Component Value Date/Time   WBC 6.4 08/11/2024 0849   WBC 4.8 08/02/2024 0342   HGB 7.4 (L) 08/11/2024 0849   HGB 14.3 05/13/2013 1116   HCT 23.1 (L) 08/11/2024 0849   HCT 42.9 05/13/2013 1116   PLT 73 (L) 08/11/2024 0849   PLT 169 05/13/2013 1116   MCV 90.2 08/11/2024 0849   MCV 84 05/13/2013 1116   NEUTROABS 5.1 08/11/2024 0849   NEUTROABS 2.3 05/13/2013 1116   LYMPHSABS 0.3 (L) 08/11/2024 0849   LYMPHSABS 0.9 (L) 05/13/2013 1116   MONOABS 0.8 08/11/2024 0849   MONOABS 0.4 05/13/2013 1116   EOSABS 0.2 08/11/2024 0849   EOSABS 0.1 05/13/2013 1116   BASOSABS 0.0 08/11/2024 0849   BASOSABS 0.0 05/13/2013 1116    Comprehensive Metabolic Panel:    Component Value Date/Time   NA 136 08/11/2024 0849   NA 143 05/13/2013 1116   K 4.5 08/11/2024 0849   K 3.5 05/13/2013 1116   CL 105 08/11/2024 0849   CL 102 05/13/2013 1116   CO2 23 08/11/2024 0849   CO2 32 05/13/2013 1116   BUN 71 (H) 08/11/2024 0849   BUN 14 05/13/2013 1116   CREATININE 3.29 (H) 08/11/2024 0849   CREATININE 1.53 (H) 05/13/2013 1116   GLUCOSE 99 08/11/2024 0849   GLUCOSE 122 (H) 05/13/2013 1116   CALCIUM  8.7 (L) 08/11/2024 0849   CALCIUM  8.6 05/13/2013 1116   AST 24 08/11/2024 0849   ALT 14 08/11/2024 0849   ALT 37 07/24/2012 1915   ALKPHOS 238 (H) 08/11/2024 0849   ALKPHOS 129 07/24/2012 1915   BILITOT 1.0 08/11/2024 0849   PROT 6.9 08/11/2024 0849   PROT 8.4 (H) 07/24/2012 1915   ALBUMIN 3.2 (L) 08/11/2024 0849   ALBUMIN 4.0 07/24/2012 1915    RADIOGRAPHIC STUDIES: MR LUMBAR SPINE WO CONTRAST Result Date: 08/02/2024 CLINICAL DATA:  Initial evaluation for lower extremity pain and weakness. 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 EXAM: MRI LUMBAR SPINE WITHOUT CONTRAST TECHNIQUE: Multiplanar, multisequence MR imaging of the lumbar spine was performed. No intravenous contrast was administered. COMPARISON:  Prior PET-CT from 04/14/2024. FINDINGS: Segmentation: Standard. Lowest well-formed disc space labeled the L5-S1 level. Alignment: Trace facet mediated anterolisthesis of L4 on L5. Alignment otherwise normal preservation of the normal lumbar lordosis. Vertebrae: Vertebral body height maintained without acute or chronic fracture. Diffusely decreased T1 signal intensity within the visualized bone marrow, with relatively preserved fatty marrow within the L2 and L3 vertebral bodies. Findings likely reflect post radiation changes. No other discrete or worrisome osseous lesions to suggest metastatic disease or active myeloma. No other significant abnormal marrow edema. Conus medullaris and cauda equina:  Conus extends to the L1 level. Conus and cauda equina appear normal. Paraspinal and other soft tissues: Scattered anasarca with stranding within the visualized abdomen and pelvis, similar to prior. Disc levels: L1-2: Normal interspace. Mild bilateral facet hypertrophy. No canal or foraminal stenosis. L2-3: Mild intervertebral disc space narrowing. Disc desiccation with mild disc bulge. Mild bilateral facet hypertrophy. No spinal stenosis. Foramina remain patent. L3-4: Disc desiccation with mild circumferential disc bulge. Mild bilateral facet hypertrophy. No spinal stenosis. Foramina remain patent. L4-5: Trace anterolisthesis. Disc desiccation with  mild disc bulge. Superimposed tiny central disc protrusion minimally indents the ventral thecal sac (series 12, image 23). Mild to moderate bilateral facet hypertrophy with trace joint effusions. No significant spinal stenosis. Mild to moderate bilateral L4 foraminal narrowing. L5-S1: Mild left-sided endplate spurring without significant disc bulge. Mild bilateral facet hypertrophy. Mild epidural lipomatosis. No spinal stenosis. Mild bilateral L5 foraminal narrowing. IMPRESSION: 1. No acute abnormality within the lumbar spine. 2. Diffusely decreased T1 signal intensity within the visualized bone marrow, with relatively preserved fatty marrow changes within the L2 and L3 vertebral bodies. Findings likely reflect post XRT changes. No other evidence for metastatic disease or active myeloma within the lumbar spine. 3. Mild multilevel degenerative disc disease and facet hypertrophy as above. No significant spinal stenosis. Mild to moderate bilateral L4 and L5 foraminal narrowing related to disc bulge and facet hypertrophy. Electronically Signed   By: Morene Hoard M.D.   On: 08/02/2024 02:34   DG Foot Complete Left Result Date: 07/31/2024 CLINICAL DATA:  pain EXAM: LEFT FOOT - COMPLETE 3+ VIEW COMPARISON:  None Available. FINDINGS: No acute fracture or dislocation.  There is no evidence of arthropathy or other focal bone abnormality. Soft tissues are unremarkable. No radiopaque foreign body. IMPRESSION: No acute fracture or dislocation. Electronically Signed   By: Rogelia Myers M.D.   On: 07/31/2024 15:19   DG Foot Complete Right Result Date: 07/31/2024 CLINICAL DATA:  pain EXAM: RIGHT FOOT COMPLETE - 3+ VIEW COMPARISON:  None Available. FINDINGS: No acute fracture or dislocation. There is no evidence of arthropathy or other focal bone abnormality. Soft tissues are unremarkable. No radiopaque foreign body. IMPRESSION: No acute fracture or dislocation. Electronically Signed   By: Rogelia Myers M.D.   On: 07/31/2024 15:18   CT ABDOMEN PELVIS WO CONTRAST Result Date: 07/29/2024 EXAM: CT ABDOMEN AND PELVIS WITHOUT CONTRAST 07/29/2024 07:15:56 PM TECHNIQUE: CT of the abdomen and pelvis was performed without the administration of intravenous contrast. Multiplanar reformatted images are provided for review. Automated exposure control, iterative reconstruction, and/or weight-based adjustment of the mA/kV was utilized to reduce the radiation dose to as low as reasonably achievable. COMPARISON: PET/CT dated 04/14/2024. CLINICAL HISTORY: Abdominal pain, acute, nonlocalized. Sent by PCP after positive stool sample for C-diff. Patient has hemorrhoids that cause him rectal pain. Reports he has liver cancer. Currently receives chemo every three weeks. History of Prostate cancer, colon cancer and multiple myeloma. Reports history of stroke. FINDINGS: LOWER CHEST: Cardiomegaly. Linear scarring and atelectasis in the left lower lobe. LIVER: Nodular hepatic contour. Possible subtle liver metastases on PET are not evident on this non-contrast CT. GALLBLADDER AND BILE DUCTS: Layering gallstones versus gallbladder sludge (image 37) without convincing findings to suggest acute cholecystitis. No biliary ductal dilatation. SPLEEN: No acute abnormality. PANCREAS: No acute abnormality. ADRENAL  GLANDS: No acute abnormality. KIDNEYS, URETERS AND BLADDER: No stones in the kidneys or ureters. No hydronephrosis. No perinephric or periureteral stranding. Associated bladder wall thickening, favoring sequelae of chronic bilateral obstruction, although mild perivesical stranding raises the possibility of cystitis (image 71). GI AND BOWEL: Stomach demonstrates no acute abnormality. Status post right hemicolectomy. There is no bowel obstruction. PERITONEUM AND RETROPERITONEUM: Mild upper abdominal and mesenteric fluid/ascites. No free air. VASCULATURE: Atherosclerotic calcifications of the abdominal aorta and branch vessels. Aorta is normal in caliber. LYMPH NODES: 2.1 cm short axis right retrocaval nodal metastasis (image 34), similar to recent PET. 13 mm short axis right retrocrural nodal metastasis (image 19), mildly progressive . REPRODUCTIVE ORGANS: Prostatomegaly, suggesting BPH. BONES  AND SOFT TISSUES: Mild degenerative changes of the thoracolumbar spine. No acute osseous abnormality. No focal soft tissue abnormality. IMPRESSION: 1. Status post right hemicolectomy. Retrocrural and para-aortic nodal metastases, mildly progressive. 2. Suspected cirrhosis. Suspected subtle hepatic metastases are not well visualized. Mild ascites. 3. Prostatomegaly, suggesting BPH. 4. Possible cystitis, equivocal. Electronically signed by: Pinkie Pebbles MD 07/29/2024 07:35 PM EDT RP Workstation: [id number]     PERFORMANCE STATUS (ECOG) : 2 - Symptomatic, <50% confined to bed  Review of Systems Unless otherwise noted, a complete review of systems is negative.  Physical Exam General: NAD Cardiovascular: regular rate and rhythm Pulmonary: clear anterior/posterior fields Abdomen: soft, nontender, + bowel sounds GU: no suprapubic tenderness Extremities: mild pedal edema, no joint deformities Skin: no rashes Neurological: Weakness, chronic expressive aphasia Rectal exam: No obvious external hemorrhoids or fissures.   Patient does have perianal excoriation.  Also has a circular area of vesicles, which is herpetic appearing superior to his rectum near his sacrum.  No induration or drainage.   IMPRESSION/PLAN: Colorectal cancer -on treatment FOLFIRI.    Prostate cancer -followed by urology  Myeloma -on surveillance  Diarrhea -patient with recent history of C. difficile but diarrhea resolved with course of oral vancomycin .  Timing of this episode of diarrhea suggestive of chemotherapy.  However, will recheck stool studies.  If negative, would recommend antidiarrheals and supportive care.    Rectal pain -recommended zinc oxide as a barrier protectant given extent of excoriation.  Will start patient empirically on valacyclovir given herpetic appearing lesion near his sacrum.  Patient can utilize oxycodone  as needed for pain.  Anemia - Hg 6.8, down from 7.4 ten days prior. Patient adamant that he will not return to the emergency department or be hospitalized.  He says he will only accept outpatient management.  Proceed with IV fluids and supportive care today. Will plan PRBC transfusion for Monday.   Follow up next week.   Patient expressed understanding and was in agreement with this plan. He also understands that He can call clinic at any time with any questions, concerns, or complaints.   Thank you for allowing me to participate in the care of this very pleasant patient.   Time Total: 25 minutes  Visit consisted of counseling and education dealing with the complex and emotionally intense issues of symptom management in the setting of serious illness.Greater than 50%  of this time was spent counseling and coordinating care related to the above assessment and plan.  Signed by: Fonda Mower, PhD, NP-C

## 2024-08-21 NOTE — Progress Notes (Signed)
 Critical lab called by Luke Horn ; Hemoglobin 6.8 Read back. Josh Borders notified.

## 2024-08-22 ENCOUNTER — Other Ambulatory Visit: Payer: Self-pay | Admitting: Hospice and Palliative Medicine

## 2024-08-22 MED ORDER — DIPHENOXYLATE-ATROPINE 2.5-0.025 MG PO TABS: 1.0000 | ORAL_TABLET | Freq: Four times a day (QID) | ORAL | 0 refills | Status: AC | PRN

## 2024-08-22 NOTE — Progress Notes (Signed)
 Stool studies negative. Spoke with patient. He reports feeling improved. Likely diarrhea from chemotherapy. Sent in Rx for Lomotil to have on hand if needed.

## 2024-08-24 ENCOUNTER — Inpatient Hospital Stay

## 2024-08-24 DIAGNOSIS — D649 Anemia, unspecified: Secondary | ICD-10-CM

## 2024-08-24 DIAGNOSIS — Z5111 Encounter for antineoplastic chemotherapy: Secondary | ICD-10-CM | POA: Diagnosis not present

## 2024-08-24 MED ORDER — ACETAMINOPHEN 325 MG PO TABS
650.0000 mg | ORAL_TABLET | Freq: Once | ORAL | Status: AC
  Administered 2024-08-24: 325 mg via ORAL
  Filled 2024-08-24: qty 2

## 2024-08-24 MED ORDER — SODIUM CHLORIDE 0.9% IV SOLUTION
250.0000 mL | INTRAVENOUS | Status: DC
  Administered 2024-08-24: 100 mL via INTRAVENOUS
  Filled 2024-08-24: qty 250

## 2024-08-24 MED ORDER — DIPHENHYDRAMINE HCL 25 MG PO CAPS: 25.0000 mg | ORAL_CAPSULE | Freq: Once | ORAL | Status: DC

## 2024-08-24 NOTE — Patient Instructions (Signed)

## 2024-08-25 ENCOUNTER — Ambulatory Visit

## 2024-08-25 ENCOUNTER — Other Ambulatory Visit

## 2024-08-25 ENCOUNTER — Ambulatory Visit: Admitting: Oncology

## 2024-08-25 ENCOUNTER — Ambulatory Visit
Admission: RE | Admit: 2024-08-25 | Discharge: 2024-08-25 | Disposition: A | Discharge status: Home / Self Care | Source: Ambulatory Visit | Attending: Oncology | Admitting: Oncology

## 2024-08-25 DIAGNOSIS — R590 Localized enlarged lymph nodes: Secondary | ICD-10-CM | POA: Insufficient documentation

## 2024-08-25 DIAGNOSIS — C184 Malignant neoplasm of transverse colon: Secondary | ICD-10-CM | POA: Insufficient documentation

## 2024-08-25 DIAGNOSIS — R188 Other ascites: Secondary | ICD-10-CM | POA: Diagnosis not present

## 2024-08-25 LAB — TYPE AND SCREEN
ABO/RH(D): O POS
Antibody Screen: NEGATIVE
Unit division: 0
Unit division: 0

## 2024-08-25 LAB — GLUCOSE, CAPILLARY: Glucose-Capillary: 80 mg/dL (ref 70–99)

## 2024-08-25 LAB — BPAM RBC
Blood Product Expiration Date: 202511072359
Blood Product Unit Number: 202510312359
ISSUE DATE / TIME: 202510201111
PRODUCT CODE: 202510201316
PRODUCT CODE: 202511072359
Unit Type and Rh: 5100
Unit Type and Rh: 202510312359
Unit Type and Rh: 5100
Unit Type and Rh: 5100

## 2024-08-25 MED ORDER — FLUDEOXYGLUCOSE F - 18 (FDG) INJECTION
10.2400 | Freq: Once | INTRAVENOUS | Status: AC | PRN
  Administered 2024-08-25: 10.24 via INTRAVENOUS

## 2024-08-26 ENCOUNTER — Other Ambulatory Visit: Admitting: Urology

## 2024-08-26 ENCOUNTER — Other Ambulatory Visit: Payer: Self-pay | Admitting: *Deleted

## 2024-08-26 DIAGNOSIS — D649 Anemia, unspecified: Secondary | ICD-10-CM

## 2024-08-26 DIAGNOSIS — C184 Malignant neoplasm of transverse colon: Secondary | ICD-10-CM

## 2024-08-26 NOTE — Progress Notes (Signed)
 cbc

## 2024-08-27 ENCOUNTER — Other Ambulatory Visit: Payer: Self-pay | Admitting: *Deleted

## 2024-08-27 ENCOUNTER — Inpatient Hospital Stay (HOSPITAL_BASED_OUTPATIENT_CLINIC_OR_DEPARTMENT_OTHER): Admitting: Hospice and Palliative Medicine

## 2024-08-27 ENCOUNTER — Other Ambulatory Visit: Payer: Self-pay

## 2024-08-27 ENCOUNTER — Encounter

## 2024-08-27 ENCOUNTER — Encounter: Payer: Self-pay | Admitting: Hospice and Palliative Medicine

## 2024-08-27 ENCOUNTER — Inpatient Hospital Stay

## 2024-08-27 VITALS — BP 91/55 | HR 54 | Temp 98.7°F | Resp 18 | Ht 69.0 in | Wt 192.0 lb

## 2024-08-27 DIAGNOSIS — Z5111 Encounter for antineoplastic chemotherapy: Secondary | ICD-10-CM | POA: Diagnosis not present

## 2024-08-27 DIAGNOSIS — C184 Malignant neoplasm of transverse colon: Secondary | ICD-10-CM

## 2024-08-27 DIAGNOSIS — D649 Anemia, unspecified: Secondary | ICD-10-CM

## 2024-08-27 LAB — CBC WITH DIFFERENTIAL/PLATELET
Abs Immature Granulocytes: 0 K/uL (ref 0.00–0.07)
Basophils Absolute: 0 K/uL (ref 0.0–0.1)
Basophils Relative: 1 %
Eosinophils Absolute: 0.2 K/uL (ref 0.0–0.5)
Eosinophils Relative: 13 %
HCT: 24.2 % — ABNORMAL LOW (ref 39.0–52.0)
Hemoglobin: 7.8 g/dL — ABNORMAL LOW (ref 13.0–17.0)
Immature Granulocytes: 0 %
Lymphocytes Relative: 22 %
Lymphs Abs: 0.3 K/uL — ABNORMAL LOW (ref 0.7–4.0)
MCH: 29.1 pg (ref 26.0–34.0)
MCHC: 32.2 g/dL (ref 30.0–36.0)
MCV: 90.3 fL (ref 80.0–100.0)
Monocytes Absolute: 0.2 K/uL (ref 0.1–1.0)
Monocytes Relative: 18 %
Neutro Abs: 0.6 K/uL — ABNORMAL LOW (ref 1.7–7.7)
Neutrophils Relative %: 46 %
Platelets: 89 K/uL — ABNORMAL LOW (ref 150–400)
RBC: 2.68 MIL/uL — ABNORMAL LOW (ref 4.22–5.81)
RDW: 18 % — ABNORMAL HIGH (ref 11.5–15.5)
WBC: 1.3 K/uL — ABNORMAL LOW (ref 4.0–10.5)
nRBC: 0 % (ref 0.0–0.2)

## 2024-08-27 LAB — CMP (CANCER CENTER ONLY)
ALT: 21 U/L (ref 0–44)
AST: 33 U/L (ref 15–41)
Albumin: 2.9 g/dL — ABNORMAL LOW (ref 3.5–5.0)
Alkaline Phosphatase: 242 U/L — ABNORMAL HIGH (ref 38–126)
Anion gap: 9 (ref 5–15)
BUN: 58 mg/dL — ABNORMAL HIGH (ref 8–23)
CO2: 24 mmol/L (ref 22–32)
Calcium: 8.2 mg/dL — ABNORMAL LOW (ref 8.9–10.3)
Chloride: 104 mmol/L (ref 98–111)
Creatinine: 2.78 mg/dL — ABNORMAL HIGH (ref 0.61–1.24)
GFR, Estimated: 24 mL/min — ABNORMAL LOW (ref >60)
Glucose, Bld: 118 mg/dL — ABNORMAL HIGH (ref 70–99)
Potassium: 3.7 mmol/L (ref 3.5–5.1)
Sodium: 137 mmol/L (ref 135–145)
Total Bilirubin: 1.4 mg/dL — ABNORMAL HIGH (ref 0.0–1.2)
Total Protein: 6.5 g/dL (ref 6.5–8.1)

## 2024-08-27 LAB — MAGNESIUM: Magnesium: 1.7 mg/dL (ref 1.7–2.4)

## 2024-08-27 LAB — PREPARE RBC (CROSSMATCH)

## 2024-08-27 NOTE — Progress Notes (Signed)
 Symptom Management Clinic [facility] [phone] [phone]  Patient Care Team: Jeffie Cheryl BRAVO, MD as PCP - General (Family Medicine) [name], MD as Consulting Physician (Oncology) Lane Arthea BRAVO, MD as Referring Physician (Neurology) Lateef, Munsoor, MD as Consulting Physician (Nephrology) Jama Margery ORN, MD as Referring Physician (Cardiology) Lenn Aran, MD as Consulting Physician (Radiation Oncology)   NAME OF PATIENT: Chris George  [id number]  02/26/1956   DATE OF VISIT: 08/27/24  REASON FOR CONSULT: Chris George is a 68 y.o. male with multiple medical problems including recurrent stage IIIc colon cancer, smoldering myeloma, and prostate cancer.  History of stroke with residual expressive aphasia.  INTERVAL HISTORY: Patient was hospitalized 07/29/2024 to 07/30/2024 and then 07/31/2024 to 08/03/2024.  Patient with recent C. difficile and EAEC diarrhea.  He was discharged from hospital 9/25 on Dificid  and then readmitted with bilateral severe leg pain, thought secondary to peripheral neuropathy.  He has status post cycle 15 FOLFIRI on 08/11/2024.  Patient received 2 units PRBC transfusion earlier this week.  He returns to [facility] today for follow-up labs.  Patient says he is feeling much improved.  He has had more energy.  He says his diarrhea has resolved.  He denies any symptomatic complaints or concerns today.  Denies any easy bleeding or bruising. Reports weight loss. Denies chest pain. Denies any nausea, vomiting. Denies urinary complaints. Patient offers no further specific complaints today.   PAST MEDICAL HISTORY: Past Medical History:  Diagnosis Date   A-fib (HCC)    Acute kidney injury superimposed on chronic kidney disease 11/19/2023   Acute on chronic heart failure (HCC) 11/19/2023   Acute on chronic heart failure with preserved ejection fraction (HCC) 11/19/2023   Anemia    Arthritis     ankles   Cancer (HCC)    multiple myeloma   Cancer of transverse colon (HCC) 10/26/2019   Cardiomyopathy (HCC)    Chemotherapy-induced peripheral neuropathy    CHF (congestive heart failure) (HCC)    Depression    Dysrhythmia    atrial fib   Elevated troponin 11/19/2023   History of CVA (cerebrovascular accident)    HLD (hyperlipidemia)    Hypercholesteremia    Hypertension    Moderate tricuspid insufficiency    Multiple myeloma (HCC)    not being treated right now per pt 11/11/19   Pneumonia    Priapism 07/25/2012   Sleep apnea    No CPAP.  OSA resolved with wt loss.    PAST SURGICAL HISTORY:  Past Surgical History:  Procedure Laterality Date   ATRIAL ABLATION SURGERY     CARDIAC SURGERY     A-Fib Ablations   COLONOSCOPY N/A 03/17/2024   Procedure: COLONOSCOPY;  Surgeon: Jinny Carmine, MD;  Location: [facility] ENDOSCOPY;  Service: Endoscopy;  Laterality: N/A;   COLONOSCOPY WITH PROPOFOL  N/A 10/26/2019   Procedure: COLONOSCOPY WITH BIOPSY;  Surgeon: Jinny Carmine, MD;  Location: [facility] SURGERY [facility]TR;  Service: Endoscopy;  Laterality: N/A;   COLONOSCOPY WITH PROPOFOL  N/A 09/03/2022   Procedure: COLONOSCOPY WITH PROPOFOL ;  Surgeon: Jinny Carmine, MD;  Location: [facility] SURGERY [facility]TR;  Service: Endoscopy;  Laterality: N/A;   ESOPHAGOGASTRODUODENOSCOPY N/A 03/17/2024   Procedure: GI Bleeding;  Surgeon: Jinny Carmine, MD;  Location: [facility] ENDOSCOPY;  Service: Endoscopy;  Laterality: N/A;  GI Bleeding, EGD   ESOPHAGOGASTRODUODENOSCOPY (EGD) WITH PROPOFOL  N/A 10/26/2019   Procedure: ESOPHAGOGASTRODUODENOSCOPY (EGD) WITH BIOPSY;  Surgeon: Jinny Carmine, MD;  Location: [facility];  Service:  Endoscopy;  Laterality: N/A;  sleep apnea   LAPAROSCOPIC PARTIAL COLECTOMY Right 11/17/2019   Procedure: LAPAROSCOPIC PARTIAL COLECTOMY RIGHT EXTENDED;  Surgeon: Desiderio Schanz, MD;  Location: [facility] ORS;  Service: General;  Laterality: Right;   PORTACATH PLACEMENT N/A 12/28/2019   Procedure: INSERTION  PORT-A-CATH Left subclavian;  Surgeon: Desiderio Schanz, MD;  Location: [facility] ORS;  Service: General;  Laterality: N/A;    HEMATOLOGY/ONCOLOGY HISTORY:  Oncology History  Cancer of transverse colon (HCC)  10/27/2019 Initial Diagnosis   Cancer of transverse colon (HCC)   12/01/2019 Cancer Staging   Staging form: Colon and Rectum, AJCC 8th Edition - Clinical stage from 12/01/2019: Stage IIIC (cT3, cN2b, cM0) - Signed by [name], MD on 12/01/2019   12/30/2019 - 06/03/2020 Chemotherapy   Patient is on Treatment Plan : COLORECTAL FOLFOX q14d x 6 months     05/15/2023 - 04/30/2024 Chemotherapy   Patient is on Treatment Plan : COLORECTAL FOLFIRI q14d     05/12/2024 -  Chemotherapy   Patient is on Treatment Plan : COLORECTAL FOLFIRI q14d       ALLERGIES:  has no known allergies.  MEDICATIONS:  Current Outpatient Medications  Medication Sig Dispense Refill   apixaban  (ELIQUIS ) 5 MG TABS tablet Take 5 mg by mouth 2 (two) times daily.     atorvastatin  (LIPITOR) 80 MG tablet Take 80 mg by mouth daily.     bumetanide  (BUMEX ) 2 MG tablet Take 1.5 mg by mouth daily. Taking one in the morning.     calcitRIOL  (ROCALTROL ) 0.25 MCG capsule Take 0.25 mcg by mouth daily.     colchicine  0.6 MG tablet Take 1 tablet (0.6 mg total) by mouth daily as needed. Until flare resolves.     diphenoxylate-atropine  (LOMOTIL) 2.5-0.025 MG tablet Take 1 tablet by mouth 4 (four) times daily as needed for diarrhea or loose stools. 30 tablet 0   gabapentin  (NEURONTIN ) 100 MG capsule Take 2 capsules (200 mg total) by mouth 2 (two) times daily. 60 capsule 1   hydrocortisone  (ANUSOL -HC) 2.5 % rectal cream Place 1 Application rectally 2 (two) times daily. 30 g 0   lidocaine -prilocaine  (EMLA ) cream Apply 1 Application topically daily.     magnesium  chloride (SLOW-MAG) 64 MG TBEC SR tablet Take 1 tablet (64 mg total) by mouth daily. 60 tablet 1   ondansetron  (ZOFRAN ) 8 MG tablet Take 1 tablet (8 mg total) by mouth every 8  (eight) hours as needed for nausea or vomiting. 20 tablet 0   oxyCODONE -acetaminophen  (PERCOCET/ROXICET) 5-325 MG tablet Take 1-2 tablets by mouth every 6 (six) hours as needed for severe pain (pain score 7-10). 60 tablet 0   prochlorperazine  (COMPAZINE ) 10 MG tablet Take 1 tablet (10 mg total) by mouth every 6 (six) hours as needed for nausea or vomiting. 30 tablet 0   sodium bicarbonate 650 MG tablet Take 1 tablet (650 mg total) by mouth 2 (two) times daily. 120 tablet 1   tamsulosin  (FLOMAX ) 0.4 MG CAPS capsule Take 1 capsule (0.4 mg total) by mouth daily after supper. 30 capsule 6   valACYclovir (VALTREX) 1000 MG tablet Take 1 tablet (1,000 mg total) by mouth daily. 10 tablet 0   [Paused] spironolactone (ALDACTONE) 25 MG tablet Take 1 tablet by mouth every morning.     No current facility-administered medications for this visit.   Facility-Administered Medications Ordered in Other Visits  Medication Dose Route Frequency Provider Last Rate Last Admin   heparin  lock flush 100 unit/mL  500 Units Intravenous  Once [name], MD        VITAL SIGNS: BP (!) 91/55   Pulse (!) 54   Temp 98.7 F (37.1 C) (Oral)   Resp 18   Ht 5' 9 (1.753 m)   Wt 192 lb (87.1 kg)   BMI 28.35 kg/m  Filed Weights   08/27/24 1042  Weight: 192 lb (87.1 kg)     Estimated body mass index is 28.35 kg/m as calculated from the following:   Height as of this encounter: 5' 9 (1.753 m).   Weight as of this encounter: 192 lb (87.1 kg).  LABS: CBC:    Component Value Date/Time   WBC 1.3 (L) 08/27/2024 1024   HGB 7.8 (L) 08/27/2024 1024   HGB 6.8 (LL) 08/21/2024 1233   HGB 14.3 05/13/2013 1116   HCT 24.2 (L) 08/27/2024 1024   HCT 42.9 05/13/2013 1116   PLT 89 (L) 08/27/2024 1024   PLT 63 (L) 08/21/2024 1233   PLT 169 05/13/2013 1116   MCV 90.3 08/27/2024 1024   MCV 84 05/13/2013 1116   NEUTROABS 0.6 (L) 08/27/2024 1024   NEUTROABS 2.3 05/13/2013 1116   LYMPHSABS 0.3 (L) 08/27/2024 1024    LYMPHSABS 0.9 (L) 05/13/2013 1116   MONOABS 0.2 08/27/2024 1024   MONOABS 0.4 05/13/2013 1116   EOSABS 0.2 08/27/2024 1024   EOSABS 0.1 05/13/2013 1116   BASOSABS 0.0 08/27/2024 1024   BASOSABS 0.0 05/13/2013 1116   Comprehensive Metabolic Panel:    Component Value Date/Time   NA 137 08/27/2024 1024   NA 143 05/13/2013 1116   K 3.7 08/27/2024 1024   K 3.5 05/13/2013 1116   CL 104 08/27/2024 1024   CL 102 05/13/2013 1116   CO2 24 08/27/2024 1024   CO2 32 05/13/2013 1116   BUN 58 (H) 08/27/2024 1024   BUN 14 05/13/2013 1116   CREATININE 2.78 (H) 08/27/2024 1024   CREATININE 1.53 (H) 05/13/2013 1116   GLUCOSE 118 (H) 08/27/2024 1024   GLUCOSE 122 (H) 05/13/2013 1116   CALCIUM  8.2 (L) 08/27/2024 1024   CALCIUM  8.6 05/13/2013 1116   AST 33 08/27/2024 1024   ALT 21 08/27/2024 1024   ALT 37 07/24/2012 1915   ALKPHOS 242 (H) 08/27/2024 1024   ALKPHOS 129 07/24/2012 1915   BILITOT 1.4 (H) 08/27/2024 1024   PROT 6.5 08/27/2024 1024   PROT 8.4 (H) 07/24/2012 1915   ALBUMIN 2.9 (L) 08/27/2024 1024   ALBUMIN 4.0 07/24/2012 1915    RADIOGRAPHIC STUDIES: MR LUMBAR SPINE WO CONTRAST Result Date: 08/02/2024 CLINICAL DATA:  Initial evaluation for lower extremity pain and weakness. 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 EXAM: MRI LUMBAR SPINE WITHOUT CONTRAST TECHNIQUE: Multiplanar, multisequence MR imaging of the lumbar spine was performed. No intravenous contrast was administered. COMPARISON:  Prior PET-CT from 04/14/2024. FINDINGS: Segmentation: Standard. Lowest well-formed disc space labeled the L5-S1 level. Alignment: Trace facet mediated anterolisthesis of L4 on L5. Alignment otherwise normal preservation of the normal lumbar lordosis. Vertebrae: Vertebral body height maintained without acute or chronic fracture. Diffusely decreased T1 signal intensity within the visualized bone marrow, with relatively preserved fatty marrow within the L2 and L3 vertebral  bodies. Findings likely reflect post radiation changes. No other discrete or worrisome osseous lesions to suggest metastatic disease or active myeloma. No other significant abnormal marrow edema. Conus medullaris and cauda equina: Conus extends to the L1 level. Conus and cauda equina appear normal. Paraspinal and other soft tissues: Scattered  anasarca with stranding within the visualized abdomen and pelvis, similar to prior. Disc levels: L1-2: Normal interspace. Mild bilateral facet hypertrophy. No canal or foraminal stenosis. L2-3: Mild intervertebral disc space narrowing. Disc desiccation with mild disc bulge. Mild bilateral facet hypertrophy. No spinal stenosis. Foramina remain patent. L3-4: Disc desiccation with mild circumferential disc bulge. Mild bilateral facet hypertrophy. No spinal stenosis. Foramina remain patent. L4-5: Trace anterolisthesis. Disc desiccation with mild disc bulge. Superimposed tiny central disc protrusion minimally indents the ventral thecal sac (series 12, image 23). Mild to moderate bilateral facet hypertrophy with trace joint effusions. No significant spinal stenosis. Mild to moderate bilateral L4 foraminal narrowing. L5-S1: Mild left-sided endplate spurring without significant disc bulge. Mild bilateral facet hypertrophy. Mild epidural lipomatosis. No spinal stenosis. Mild bilateral L5 foraminal narrowing. IMPRESSION: 1. No acute abnormality within the lumbar spine. 2. Diffusely decreased T1 signal intensity within the visualized bone marrow, with relatively preserved fatty marrow changes within the L2 and L3 vertebral bodies. Findings likely reflect post XRT changes. No other evidence for metastatic disease or active myeloma within the lumbar spine. 3. Mild multilevel degenerative disc disease and facet hypertrophy as above. No significant spinal stenosis. Mild to moderate bilateral L4 and L5 foraminal narrowing related to disc bulge and facet hypertrophy. Electronically Signed   By:  Morene Hoard M.D.   On: 08/02/2024 02:34   DG Foot Complete Left Result Date: 07/31/2024 CLINICAL DATA:  pain EXAM: LEFT FOOT - COMPLETE 3+ VIEW COMPARISON:  None Available. FINDINGS: No acute fracture or dislocation. There is no evidence of arthropathy or other focal bone abnormality. Soft tissues are unremarkable. No radiopaque foreign body. IMPRESSION: No acute fracture or dislocation. Electronically Signed   By: Rogelia Myers M.D.   On: 07/31/2024 15:19   DG Foot Complete Right Result Date: 07/31/2024 CLINICAL DATA:  pain EXAM: RIGHT FOOT COMPLETE - 3+ VIEW COMPARISON:  None Available. FINDINGS: No acute fracture or dislocation. There is no evidence of arthropathy or other focal bone abnormality. Soft tissues are unremarkable. No radiopaque foreign body. IMPRESSION: No acute fracture or dislocation. Electronically Signed   By: Rogelia Myers M.D.   On: 07/31/2024 15:18   CT ABDOMEN PELVIS WO CONTRAST Result Date: 07/29/2024 EXAM: CT ABDOMEN AND PELVIS WITHOUT CONTRAST 07/29/2024 07:15:56 PM TECHNIQUE: CT of the abdomen and pelvis was performed without the administration of intravenous contrast. Multiplanar reformatted images are provided for review. Automated exposure control, iterative reconstruction, and/or weight-based adjustment of the mA/kV was utilized to reduce the radiation dose to as low as reasonably achievable. COMPARISON: PET/CT dated 04/14/2024. CLINICAL HISTORY: Abdominal pain, acute, nonlocalized. Sent by PCP after positive stool sample for C-diff. Patient has hemorrhoids that cause him rectal pain. Reports he has liver cancer. Currently receives chemo every three weeks. History of Prostate cancer, colon cancer and multiple myeloma. Reports history of stroke. FINDINGS: LOWER CHEST: Cardiomegaly. Linear scarring and atelectasis in the left lower lobe. LIVER: Nodular hepatic contour. Possible subtle liver metastases on PET are not evident on this non-contrast CT. GALLBLADDER AND  BILE DUCTS: Layering gallstones versus gallbladder sludge (image 37) without convincing findings to suggest acute cholecystitis. No biliary ductal dilatation. SPLEEN: No acute abnormality. PANCREAS: No acute abnormality. ADRENAL GLANDS: No acute abnormality. KIDNEYS, URETERS AND BLADDER: No stones in the kidneys or ureters. No hydronephrosis. No perinephric or periureteral stranding. Associated bladder wall thickening, favoring sequelae of chronic bilateral obstruction, although mild perivesical stranding raises the possibility of cystitis (image 71). GI AND BOWEL: Stomach demonstrates no acute  abnormality. Status post right hemicolectomy. There is no bowel obstruction. PERITONEUM AND RETROPERITONEUM: Mild upper abdominal and mesenteric fluid/ascites. No free air. VASCULATURE: Atherosclerotic calcifications of the abdominal aorta and branch vessels. Aorta is normal in caliber. LYMPH NODES: 2.1 cm short axis right retrocaval nodal metastasis (image 34), similar to recent PET. 13 mm short axis right retrocrural nodal metastasis (image 19), mildly progressive . REPRODUCTIVE ORGANS: Prostatomegaly, suggesting BPH. BONES AND SOFT TISSUES: Mild degenerative changes of the thoracolumbar spine. No acute osseous abnormality. No focal soft tissue abnormality. IMPRESSION: 1. Status post right hemicolectomy. Retrocrural and para-aortic nodal metastases, mildly progressive. 2. Suspected cirrhosis. Suspected subtle hepatic metastases are not well visualized. Mild ascites. 3. Prostatomegaly, suggesting BPH. 4. Possible cystitis, equivocal. Electronically signed by: Pinkie Pebbles MD 07/29/2024 07:35 PM EDT RP Workstation: [id number]     PERFORMANCE STATUS (ECOG) : 2 - Symptomatic, <50% confined to bed  Review of Systems Unless otherwise noted, a complete review of systems is negative.  Physical Exam General: NAD Cardiovascular: regular rate and rhythm Pulmonary: clear anterior/posterior fields Abdomen: soft,  nontender, + bowel sounds GU: no suprapubic tenderness Extremities: mild pedal edema, no joint deformities Skin: no rashes Neurological: Weakness, chronic expressive aphasia Rectal exam: No obvious external hemorrhoids or fissures.  Patient does have perianal excoriation.  Also has a circular area of vesicles, which is herpetic appearing superior to his rectum near his sacrum.  No induration or drainage.   IMPRESSION/PLAN: Colorectal cancer -on treatment FOLFIRI.    Prostate cancer -followed by urology  Myeloma -on surveillance  Diarrhea -negative GI panel.  Likely secondary to chemotherapy.  Diarrhea has resolved per patient's report.  Recommend as needed antidiarrheals with chemotherapy.  Anemia - Hg 7.8 today.  Will proceed with transfusion 1 unit PRBC anticipation of chemotherapy next week.  Follow up next week.   Patient expressed understanding and was in agreement with this plan. He also understands that He can call clinic at any time with any questions, concerns, or complaints.   Thank you for allowing me to participate in the care of this very pleasant patient.   Time Total: 15 minutes  Visit consisted of counseling and education dealing with the complex and emotionally intense issues of symptom management in the setting of serious illness.Greater than 50%  of this time was spent counseling and coordinating care related to the above assessment and plan.  Signed by: Fonda Mower, PhD, NP-C

## 2024-08-28 ENCOUNTER — Inpatient Hospital Stay

## 2024-08-28 DIAGNOSIS — D649 Anemia, unspecified: Secondary | ICD-10-CM

## 2024-08-28 DIAGNOSIS — Z5111 Encounter for antineoplastic chemotherapy: Secondary | ICD-10-CM | POA: Diagnosis not present

## 2024-08-28 MED ORDER — DIPHENHYDRAMINE HCL 25 MG PO CAPS
25.0000 mg | ORAL_CAPSULE | Freq: Once | ORAL | Status: DC
  Filled 2024-08-28: qty 1

## 2024-08-28 MED ORDER — SODIUM CHLORIDE 0.9% IV SOLUTION
250.0000 mL | INTRAVENOUS | Status: DC
  Administered 2024-08-28: 100 mL via INTRAVENOUS
  Filled 2024-08-28: qty 250

## 2024-08-28 MED ORDER — ACETAMINOPHEN 325 MG PO TABS
650.0000 mg | ORAL_TABLET | Freq: Once | ORAL | Status: AC
  Administered 2024-08-28: 325 mg via ORAL
  Filled 2024-08-28: qty 2

## 2024-08-28 NOTE — Patient Instructions (Signed)

## 2024-08-29 LAB — BPAM RBC
Blood Product Expiration Date: 202511142359
ISSUE DATE / TIME: 202510241239
Unit Type and Rh: 5100

## 2024-08-29 LAB — TYPE AND SCREEN
ABO/RH(D): O POS
Antibody Screen: NEGATIVE
Unit division: 0

## 2024-08-31 ENCOUNTER — Other Ambulatory Visit: Payer: Self-pay | Admitting: Obstetrics and Gynecology

## 2024-09-01 ENCOUNTER — Inpatient Hospital Stay

## 2024-09-01 ENCOUNTER — Other Ambulatory Visit: Payer: Self-pay | Admitting: Oncology

## 2024-09-01 ENCOUNTER — Inpatient Hospital Stay (HOSPITAL_BASED_OUTPATIENT_CLINIC_OR_DEPARTMENT_OTHER): Admitting: Oncology

## 2024-09-01 ENCOUNTER — Encounter: Payer: Self-pay | Admitting: Urology

## 2024-09-01 ENCOUNTER — Encounter: Payer: Self-pay | Admitting: Oncology

## 2024-09-01 VITALS — BP 94/68 | HR 56 | Temp 97.5°F | Resp 18 | Ht 69.0 in | Wt 204.0 lb

## 2024-09-01 DIAGNOSIS — C184 Malignant neoplasm of transverse colon: Secondary | ICD-10-CM

## 2024-09-01 DIAGNOSIS — R197 Diarrhea, unspecified: Secondary | ICD-10-CM

## 2024-09-01 DIAGNOSIS — Z5111 Encounter for antineoplastic chemotherapy: Secondary | ICD-10-CM | POA: Diagnosis not present

## 2024-09-01 LAB — CMP (CANCER CENTER ONLY)
ALT: 20 U/L (ref 0–44)
AST: 31 U/L (ref 15–41)
Albumin: 2.9 g/dL — ABNORMAL LOW (ref 3.5–5.0)
Alkaline Phosphatase: 244 U/L — ABNORMAL HIGH (ref 38–126)
Anion gap: 9 (ref 5–15)
BUN: 54 mg/dL — ABNORMAL HIGH (ref 8–23)
CO2: 27 mmol/L (ref 22–32)
Calcium: 8.6 mg/dL — ABNORMAL LOW (ref 8.9–10.3)
Chloride: 101 mmol/L (ref 98–111)
Creatinine: 2.45 mg/dL — ABNORMAL HIGH (ref 0.61–1.24)
GFR, Estimated: 28 mL/min — ABNORMAL LOW (ref >60)
Glucose, Bld: 152 mg/dL — ABNORMAL HIGH (ref 70–99)
Potassium: 3.9 mmol/L (ref 3.5–5.1)
Sodium: 137 mmol/L (ref 135–145)
Total Bilirubin: 1.1 mg/dL (ref 0.0–1.2)
Total Protein: 6.6 g/dL (ref 6.5–8.1)

## 2024-09-01 LAB — CBC WITH DIFFERENTIAL (CANCER CENTER ONLY)
Abs Immature Granulocytes: 0 K/uL (ref 0.00–0.07)
Basophils Absolute: 0 K/uL (ref 0.0–0.1)
Basophils Relative: 1 %
Eosinophils Absolute: 0.1 K/uL (ref 0.0–0.5)
Eosinophils Relative: 9 %
HCT: 25.5 % — ABNORMAL LOW (ref 39.0–52.0)
Hemoglobin: 8.1 g/dL — ABNORMAL LOW (ref 13.0–17.0)
Immature Granulocytes: 0 %
Lymphocytes Relative: 16 %
Lymphs Abs: 0.2 K/uL — ABNORMAL LOW (ref 0.7–4.0)
MCH: 29 pg (ref 26.0–34.0)
MCHC: 31.8 g/dL (ref 30.0–36.0)
MCV: 91.4 fL (ref 80.0–100.0)
Monocytes Absolute: 0.5 K/uL (ref 0.1–1.0)
Monocytes Relative: 34 %
Neutro Abs: 0.6 K/uL — ABNORMAL LOW (ref 1.7–7.7)
Neutrophils Relative %: 40 %
Platelet Count: 85 K/uL — ABNORMAL LOW (ref 150–400)
RBC: 2.79 MIL/uL — ABNORMAL LOW (ref 4.22–5.81)
RDW: 18.2 % — ABNORMAL HIGH (ref 11.5–15.5)
WBC Count: 1.5 K/uL — ABNORMAL LOW (ref 4.0–10.5)
nRBC: 0 % (ref 0.0–0.2)

## 2024-09-01 LAB — MAGNESIUM: Magnesium: 1.6 mg/dL — ABNORMAL LOW (ref 1.7–2.4)

## 2024-09-01 NOTE — Progress Notes (Signed)
 Patient is having some swelling in his ankles, he is going to call his cardiologist today. He is also still having the urination frequency.

## 2024-09-01 NOTE — Telephone Encounter (Signed)
 Patient has been contact and told to increase his Bumex  per Dr Dorien directions.  Please see my phone call to patient in encounters from earlier this afternoon.

## 2024-09-01 NOTE — Patient Instructions (Signed)

## 2024-09-01 NOTE — Telephone Encounter (Signed)
 Patient called requesting appointment.  Has had increased edema in his legs and weight gain.

## 2024-09-01 NOTE — Telephone Encounter (Signed)
 Spoke with patient.  Patient has recently noted increased swelling in his legs and weight gain of 10 lbs per week for the last 2 weeks.    Taking 1.5 tablets of Bumex  daily.    Scheduled patient for Wednesday, 02 Sep 2024 at 0920.    I told patient I would discuss with Dr Jama and call you back if Dr Jama wants to make any changes before your appointment.    Patient was appreciative.

## 2024-09-01 NOTE — Telephone Encounter (Signed)
 Discussed patients increased LE edema and weight gain with Dr Jama Fortis patient at home.    Per Dr Jama please take 2 Bumex  tablets this evening and 2 tomorrow morning.  Patient has follow up appointment with Dr Jama tomorrow.   Patient verbalized understanding.

## 2024-09-01 NOTE — Progress Notes (Signed)
 [facility]  [phone] [phone]  ID: Chris George OB: 1956-09-02  MR#: [id number]  RDW#:[id number]  Patient Care Team: Jeffie Cheryl FORBES, MD as PCP - General (Family Medicine) [name], MD as Consulting Physician (Oncology) [name], MD as Referring Physician (Neurology) Marcelino Gales, MD as Consulting Physician (Nephrology) Jama Margery ORN, MD as Referring Physician (Cardiology) Lenn Aran, MD as Consulting Physician (Radiation Oncology)  CHIEF COMPLAINT: Recurrent colon cancer, smoldering myeloma, prostate cancer.  INTERVAL HISTORY: Patient returns to clinic today for further evaluation and discussion of his imaging results.  He continues to have chronic weakness and fatigue, but states this is mildly improved. His neuropathy pain has improved with gabapentin . He has a mild expressive aphasia, but no other neurologic complaints.  He denies any recent fevers.  He has no chest pain, cough, shortness of breath, or hemoptysis.  He denies any nausea, vomiting, constipation, or diarrhea.  He has no further melena or hematochezia.  He has no urinary complaints.  Patient offers no further specific complaints today.  REVIEW OF SYSTEMS:   Review of Systems  Constitutional:  Positive for malaise/fatigue. Negative for fever and weight loss.  Respiratory: Negative.  Negative for cough, hemoptysis and shortness of breath.   Cardiovascular: Negative.  Negative for chest pain and leg swelling.  Gastrointestinal: Negative.  Negative for blood in stool, diarrhea, melena, nausea and vomiting.  Genitourinary: Negative.  Negative for frequency and hematuria.  Musculoskeletal: Negative.  Negative for back pain and joint pain.  Skin: Negative.  Negative for rash.  Neurological:  Positive for tingling, sensory change and weakness. Negative for dizziness, focal weakness and headaches.  Psychiatric/Behavioral: Negative.  Negative for depression  and hallucinations. The patient is not nervous/anxious.   Bilateral  As per HPI. Otherwise, a complete review of systems is negative.  PAST MEDICAL HISTORY: Past Medical History:  Diagnosis Date   A-fib (HCC)    Acute kidney injury superimposed on chronic kidney disease 11/19/2023   Acute on chronic heart failure (HCC) 11/19/2023   Acute on chronic heart failure with preserved ejection fraction (HCC) 11/19/2023   Anemia    Arthritis    ankles   Cancer (HCC)    multiple myeloma   Cancer of transverse colon (HCC) 10/26/2019   Cardiomyopathy (HCC)    Chemotherapy-induced peripheral neuropathy    CHF (congestive heart failure) (HCC)    Depression    Dysrhythmia    atrial fib   Elevated troponin 11/19/2023   History of CVA (cerebrovascular accident)    HLD (hyperlipidemia)    Hypercholesteremia    Hypertension    Moderate tricuspid insufficiency    Multiple myeloma (HCC)    not being treated right now per pt 11/11/19   Pneumonia    Priapism 07/25/2012   Sleep apnea    No CPAP.  OSA resolved with wt loss.    PAST SURGICAL HISTORY: Past Surgical History:  Procedure Laterality Date   ATRIAL ABLATION SURGERY     CARDIAC SURGERY     A-Fib Ablations   COLONOSCOPY N/A 03/17/2024   Procedure: COLONOSCOPY;  Surgeon: Jinny Carmine, MD;  Location: [facility] ENDOSCOPY;  Service: Endoscopy;  Laterality: N/A;   COLONOSCOPY WITH PROPOFOL  N/A 10/26/2019   Procedure: COLONOSCOPY WITH BIOPSY;  Surgeon: Jinny Carmine, MD;  Location: [facility]ERY [facility];  Service: Endoscopy;  Laterality: N/A;   COLONOSCOPY WITH PROPOFOL  N/A 09/03/2022   Procedure: COLONOSCOPY WITH PROPOFOL ;  Surgeon: Jinny Carmine, MD;  Location: [facility] SURGERY [facility];  Service: Endoscopy;  Laterality: N/A;   ESOPHAGOGASTRODUODENOSCOPY N/A 03/17/2024   Procedure: GI Bleeding;  Surgeon: Jinny Carmine, MD;  Location: [facility] ENDOSCOPY;  Service: Endoscopy;  Laterality: N/A;  GI Bleeding, EGD   ESOPHAGOGASTRODUODENOSCOPY (EGD) WITH PROPOFOL   N/A 10/26/2019   Procedure: ESOPHAGOGASTRODUODENOSCOPY (EGD) WITH BIOPSY;  Surgeon: Jinny Carmine, MD;  Location: [facility] SURGERY CNTR;  Service: Endoscopy;  Laterality: N/A;  sleep apnea   LAPAROSCOPIC PARTIAL COLECTOMY Right 11/17/2019   Procedure: LAPAROSCOPIC PARTIAL COLECTOMY RIGHT EXTENDED;  Surgeon: Desiderio Schanz, MD;  Location: [facility] ORS;  Service: General;  Laterality: Right;   PORTACATH PLACEMENT N/A 12/28/2019   Procedure: INSERTION PORT-A-CATH Left subclavian;  Surgeon: Desiderio Schanz, MD;  Location: [facility] ORS;  Service: General;  Laterality: N/A;    FAMILY HISTORY: Family History  Problem Relation Age of Onset   Healthy Mother    Colon cancer Father    Parkinson's disease Father    Colon cancer Brother     ADVANCED DIRECTIVES (Y/N):  N  HEALTH MAINTENANCE: Social History   Tobacco Use   Smoking status: Never    Passive exposure: Never   Smokeless tobacco: Never  Vaping Use   Vaping status: Never Used  Substance Use Topics   Alcohol use: Not Currently   Drug use: Never     Colonoscopy:  PAP:  Bone density:  Lipid panel:  No Known Allergies  Current Outpatient Medications  Medication Sig Dispense Refill   apixaban  (ELIQUIS ) 5 MG TABS tablet Take 5 mg by mouth 2 (two) times daily.     atorvastatin  (LIPITOR) 80 MG tablet Take 80 mg by mouth daily.     bumetanide  (BUMEX ) 2 MG tablet Take 1.5 mg by mouth daily. Taking one in the morning.     calcitRIOL  (ROCALTROL ) 0.25 MCG capsule Take 0.25 mcg by mouth daily.     colchicine  0.6 MG tablet Take 1 tablet (0.6 mg total) by mouth daily as needed. Until flare resolves.     diphenoxylate-atropine  (LOMOTIL) 2.5-0.025 MG tablet Take 1 tablet by mouth 4 (four) times daily as needed for diarrhea or loose stools. 30 tablet 0   gabapentin  (NEURONTIN ) 100 MG capsule Take 2 capsules (200 mg total) by mouth 2 (two) times daily. 60 capsule 1   hydrocortisone  (ANUSOL -HC) 2.5 % rectal cream Place 1 Application rectally 2 (two) times  daily. 30 g 0   lidocaine -prilocaine  (EMLA ) cream Apply 1 Application topically daily.     magnesium  chloride (SLOW-MAG) 64 MG TBEC SR tablet Take 1 tablet (64 mg total) by mouth daily. 60 tablet 1   ondansetron  (ZOFRAN ) 8 MG tablet Take 1 tablet (8 mg total) by mouth every 8 (eight) hours as needed for nausea or vomiting. 20 tablet 0   oxyCODONE -acetaminophen  (PERCOCET/ROXICET) 5-325 MG tablet Take 1-2 tablets by mouth every 6 (six) hours as needed for severe pain (pain score 7-10). 60 tablet 0   prochlorperazine  (COMPAZINE ) 10 MG tablet Take 1 tablet (10 mg total) by mouth every 6 (six) hours as needed for nausea or vomiting. 30 tablet 0   sodium bicarbonate 650 MG tablet Take 1 tablet (650 mg total) by mouth 2 (two) times daily. 120 tablet 1   tamsulosin  (FLOMAX ) 0.4 MG CAPS capsule Take 1 capsule (0.4 mg total) by mouth daily after supper. 30 capsule 6   valACYclovir (VALTREX) 1000 MG tablet Take 1 tablet (1,000 mg total) by mouth daily. 10 tablet 0   [Paused] spironolactone (ALDACTONE) 25 MG tablet Take 1 tablet by mouth every morning. (  Patient not taking: Reported on 09/01/2024)     No current facility-administered medications for this visit.   Facility-Administered Medications Ordered in Other Visits  Medication Dose Route Frequency Provider Last Rate Last Admin   heparin  lock flush 100 unit/mL  500 Units Intravenous Once Bera Pinela J, MD        OBJECTIVE: Vitals:   09/01/24 0850  BP: 94/68  Pulse: (!) 56  Resp: 18  Temp: (!) 97.5 F (36.4 C)  SpO2: 100%       Body mass index is 30.13 kg/m.    ECOG FS:1 - Symptomatic but completely ambulatory  General: Well-developed, well-nourished, no acute distress. Eyes: Pink conjunctiva, anicteric sclera. HEENT: Normocephalic, moist mucous membranes. Lungs: No audible wheezing or coughing. Heart: Regular rate and rhythm. Abdomen: Soft, nontender, no obvious distention. Musculoskeletal: No edema, cyanosis, or clubbing. Neuro:  Alert, answering all questions appropriately. Cranial nerves grossly intact. Skin: No rashes or petechiae noted. Psych: Normal affect.  LAB RESULTS:  Lab Results  Component Value Date   NA 137 08/27/2024   K 3.7 08/27/2024   CL 104 08/27/2024   CO2 24 08/27/2024   GLUCOSE 118 (H) 08/27/2024   BUN 58 (H) 08/27/2024   CREATININE 2.78 (H) 08/27/2024   CALCIUM  8.2 (L) 08/27/2024   PROT 6.5 08/27/2024   ALBUMIN 2.9 (L) 08/27/2024   AST 33 08/27/2024   ALT 21 08/27/2024   ALKPHOS 242 (H) 08/27/2024   BILITOT 1.4 (H) 08/27/2024   GFRNONAA 24 (L) 08/27/2024   GFRAA >60 07/29/2020    Lab Results  Component Value Date   WBC 1.5 (L) 09/01/2024   NEUTROABS 0.6 (L) 09/01/2024   HGB 8.1 (L) 09/01/2024   HCT 25.5 (L) 09/01/2024   MCV 91.4 09/01/2024   PLT 85 (L) 09/01/2024   Lab Results  Component Value Date   IRON 27 (L) 08/01/2024   TIBC 225 (L) 08/01/2024   IRONPCTSAT 12 (L) 08/01/2024   Lab Results  Component Value Date   FERRITIN 1,107 (H) 08/01/2024     STUDIES: NM PET Image Restag (PS) Skull Base To Thigh Result Date: 08/27/2024 EXAM: PET AND CT SKULL BASE TO MID THIGH 08/25/2024 12:20:33 PM TECHNIQUE: RADIOPHARMACEUTICAL: 10.24 mCi F-18 FDG Uptake time 60 minutes. Glucose level 80 mg/dl. PET imaging was acquired from the base of the skull to the mid thighs. Non-contrast enhanced computed tomography was obtained for attenuation correction and anatomic localization. COMPARISON: PET CT 04/14/2024, CT scan 07/29/2024. CLINICAL HISTORY: Assess treatment response for colon cancer. 10.24 mCi F-18 FDG Given. Blood Glucose: 80 mg/dl. TRANSVERSE COLON CA. NPO. NOT DIABETIC. NOT TAKING ANTIBIOTICS/STEROIDS. CHEMO 2-3 WEEKS AGO. HX OF PROSTATE CA. HX OF MULTIPLE MYELOMA. FINDINGS: HEAD AND NECK: No metabolically active cervical lymphadenopathy. CHEST: New hypermetabolic lymph node in the middle mediastinum, positioned between the carina and the esophagus, with SUV max of 3.7 on image  45. No metabolically active pulmonary nodules. Port in the anterior chest wall with tip in distal SVC. ABDOMEN AND PELVIS: Single hypermetabolic lesion in the lateral aspect of the right hepatic lobe with SUV max equal to 4.8 on image 85. The previously described small lesions in the left and right hepatic lobe are for the most part not identified on current exam. Interval increase in metabolic activity of retrocrural node with SUV max equal to 17 compared to SUV max equal to 12 on image 89. No change in size. Ill-defined nodal tissue between the aorta and IVC with SUV max equal to 5.4  on image 101 compared to SUV max equal to 6.7, representing no significant change. No new sites of malignancy in the abdomen and pelvis. Intense physiologic activity noted throughout the left colon. No metabolically active intraperitoneal mass. Physiologic activity within the genitourinary systems. Moderate ascites increased. SABRA BONES AND SOFT TISSUE: No abnormal FDG activity localizes to the bones. No metabolically active aggressive osseous lesion. IMPRESSION: 1. New small  hypermetabolic middle mediastinal lymph node. 2. Interval increase in metabolic activity of the retrocrural node with stable size. 3. Single hypermetabolic lesion in the lateral right hepatic lobe previously described small hepatic lesions are largely not identified. 4. Ill-defined nodal tissue between the aorta and IVC with without significant interval change. 5. No new sites of malignancy identified in the abdomen and pelvis. 6. Moderate ascites increased. . Electronically signed by: Norleen Boxer MD 08/27/2024 11:50 AM EDT RP Workstation: [id number]     ONCOLOGY HISTORY: Patient completed cycle 9 of adjuvant FOLFOX on June 03, 2020.  Given his difficulties with treatment and declining performance status, treatment was discontinued altogether. Colonoscopy on September 03, 2022 did not reveal any significant pathology and recommendation was to repeat in 3 years.     ASSESSMENT: Recurrent colon cancer, smoldering myeloma, prostate cancer.  PLAN:    Recurrent colon cancer: PET scan results from December 30, 2023 reviewed independently with progressive disease of hypermetabolic retrocaval mass, but no other evidence of metastatic disease.  Patient has completed SBRT to this lesion.  Repeat PET scan on April 14, 2024 reviewed independently with continued progression of disease now with evidence of liver metastasis.  Patient's CEA peaked at 376, but his most recent result has trended down and plateaued at 272.  Today's result is pending.  Repeat PET scan results from August 25, 2024 revealed improvement of hypermetabolism and disease, although likely with still residual malignancy.  After lengthy discussion with the patient, he would like a chemotherapy holiday and expressed understanding that he will likely need retreatment in the future.  No further intervention is needed.  Return to clinic in 6 weeks for laboratory work only and then in 3 months for laboratory work, repeat PET scan, and further evaluation.  Prostate cancer: Gleason's 3+4.  Patient's PSA increased and peaked at 39.34, but since completing treatment it continues to trend down.  His most recent result is 1.61.  Nuclear medicine bone scan on December 13, 2022 was reported as negative.  PSMA PET per radiation oncology from Mar 21, 2023 did not reveal metastatic disease.  Patient completed XRT for local control disease on May 30, 2023.  Can consider Eligard in the future.  Continue follow-up with urology as indicated.   Smoldering myeloma: Chronic and unchanged.  Patient was initially diagnosed and treated in [place], [place]  and treated with single agent Velcade in approximately 2013.  He declined maintenance treatment or referral for bone marrow transplant.  His M spike has ranged from 1.1-2.0 since July 2020.  His most recent result from April 28, 2024 is 1.8.  Over the same timeframe his IgG component  has ranged from [phone].  His most recent result is 2635.  Finally, his kappa free light chains have ranged from 24.8 -75.3.  His most recent result was elevated at 98.5, but his lambda free light chains are also elevated.  His kappa/lambda free light chain ratio was within normal limits.  Nuclear med bone scan as above.  His most recent bone marrow biopsy completed on May 28, 2019 revealed only 10 to  15% plasma cells with no clonality reported.  Cytogenetics were also reported as normal.    Anemia: Hemoglobin stable at 8.1.  He does not require transfusion today.  Hold chemotherapy as above. Neutropenia: ANC 0.6 today.  No further treatment as above.  Continue G-CSF support with pump off for future treatments.  Continue with dose reduced FOLFIRI as above.  Thrombocytopenia: Chronic and unchanged.  Patient's platelet count is 85.   Renal insufficiency: Mildly improved.  Patient's GFR is 28 today.  Neither irinotecan  or 5-FU need to be dosed reduced in renal insufficiency.  Continue follow-up with nephrology as scheduled. Hypomagnesia: Mild, monitor. Peripheral neuropathy: Improved with gabapentin .  Continue Lyrica  50 mg daily and was previously given a referral to neurology.   Hypotension: Resolved. CHF exacerbation/edema: Resolved.  Patient was at [facility] for nearly a month at the beginning of the year.  Continue follow-up with cardiology and nephrology.  Diarrhea: Resolved.  Continue Imodium as needed. Hemorrhoids: Patient does not complain of this today.  Continue OTC remedies as needed. Urinary frequency: Patient does not complain of this today.  Patient was previously given a referral back to urology.  Patient expressed understanding and was in agreement with this plan. He also understands that He can call clinic at any time with any questions, concerns, or complaints.    Evalene JINNY Reusing, MD   09/01/2024 8:55 AM

## 2024-09-02 ENCOUNTER — Encounter: Payer: Self-pay | Admitting: Oncology

## 2024-09-02 LAB — CEA: CEA: 273 ng/mL — ABNORMAL HIGH (ref 0.0–4.7)

## 2024-09-03 ENCOUNTER — Other Ambulatory Visit: Payer: Self-pay

## 2024-09-03 ENCOUNTER — Inpatient Hospital Stay

## 2024-09-03 ENCOUNTER — Other Ambulatory Visit: Admitting: Urology

## 2024-09-04 ENCOUNTER — Encounter: Payer: Self-pay | Admitting: Oncology

## 2024-09-04 NOTE — Telephone Encounter (Signed)
 Encounter opened in error.

## 2024-09-04 NOTE — Progress Notes (Signed)
  Chris George, RMA TL   09/01/24  4:28 PM Unsigned Note [facility] plan of care on desk for signing. Order number [id number]. 3 pages   Also order number [id number]- 3 pages   Ccm- 15 minutes

## 2024-09-08 ENCOUNTER — Other Ambulatory Visit: Admitting: Urology

## 2024-09-08 ENCOUNTER — Ambulatory Visit

## 2024-09-08 ENCOUNTER — Other Ambulatory Visit

## 2024-09-08 ENCOUNTER — Ambulatory Visit: Admitting: Oncology

## 2024-09-09 ENCOUNTER — Ambulatory Visit: Admitting: Urology

## 2024-09-10 ENCOUNTER — Other Ambulatory Visit: Admitting: Urology

## 2024-09-10 ENCOUNTER — Encounter

## 2024-09-10 NOTE — BH Treatment Plan (Signed)
 Patient with eGFR 26, planning for CT TAVR with contrast to evaluate candidacy for tricuspid valve intervention. Pt understands risks of contrasted study and benefit of scan and wishes to proceed with procedure.

## 2024-09-10 NOTE — Progress Notes (Signed)
 ICU TRANSPORT NOTE  Destination: CT  Departing Unit: [facility] Pickup Time: 1025  Return Unit: [facility] Return Time: 1145  [facility] patient ID band verified Allergies Reviewed Code Status at time of transport: Full  Report received from primary nurse via SBARq. Handoff performed of continuous drip/infusion Patient transported via stretcher under ICU level of care. See vital signs during transport via [employer]. O2 via RA. Patient is RASS 0. Patient tolerated procedure/scan well. Universal, Aspiration, and Fall precautions maintained throughout transport.  Update and care given to primary nurse. See Doc Flowsheets/MAR for additional transportation documentation. Proper body mechanics and safe patient handling equipment were utilized throughout transport.

## 2024-09-11 NOTE — Anesthesia Preprocedure Evaluation (Addendum)
 Procedure Information:  Date/Time: 09/11/24 1300   Scheduled providers: Reddinger, Jayson [place], CRNA; Bryant Agent, MD   Procedure: ECHOCARDIOGRAM TRANSESOPHAGEAL ECHO   Indications: evaluate MR for clip candidacy and TR with tricsend/evoque  protocol   Location: [facility]  EP ELECTROPHYSIOLOGY [facility]     Anesthesia Evaluation     Airway  Mallampati: I TM distance: >3 FBneck circumference NOT greater than 40 cm Neck ROM: full Mouth Opening: normal and >3 FB - facial hair   Dental    (+) implants   Pulmonary - normal exam   breath sounds clear to auscultation(+) sleep apnea,    Cardiovascular  (+) hypertension, CHF murmur, ,   Rhythm: regular Rate: normal ROS comment: ECHO 09/03/24 1.. The left ventricle is upper normal in size with mildly increased wall thickness. 2. The left ventricular systolic function is normal, LVEF is visually estimated at 55%. 3. The right ventricle is severely dilated in size, with moderately reduced systolic function. 4. There is severe centrally directed mitral regurgitation. 5. There is severe tricuspid regurgitation. 6. The right atrium is severely dilated in size. 7. The aorta at the ascending aorta is moderately dilated. 8. IVC size and inspiratory change suggest elevated right atrial pressure. (10-20 mmHg). 9. Compared to study dated 06/15/24, RV function has worsened.   Neuro/Psych   (+) CVA, neuromuscular disease neuropathy  GI/Hepatic/Renal   (+) liver disease and cirrhosis, chronic renal disease CKD    Endo/Other   (+) diabetes mellitus type 2  Abdominal  - normal exam  OB/GYN      HEENT                       Anesthesia Plan  ASA 4   NPO Appropriate? Yes  Anesthetic:  General and TIVA  Standard lines and monitors.  Type of induction: IV Airway: native airway                                              Anesthesia plan and risk discussed with patient; informed consent obtained.  Use of  blood products discussed with patient, whom consented to blood products.    Patient has received blood products within the last 30 days. Not Pregnant  Plan discussed with CRNA. Serial Consent:no serial consent Date obtained:   Relevant Problems  ANESTHESIA  (+) Acute kidney injury superimposed on chronic kidney disease  (+) Acute on chronic heart failure with preserved ejection fraction (CMS-HCC)  (+) Acute on chronic heart failure with preserved ejection fraction (HFpEF) (CMS-HCC)  (+) Arthritis of left ankle  (+) Chronic kidney disease  (+) Hepatic cirrhosis, unspecified hepatic cirrhosis type, unspecified whether ascites present (CMS-HCC)  (+) History of CVA (cerebrovascular accident)  (+) Hypertension, essential, benign  (+) OSA (obstructive sleep apnea)  (+) Permanent atrial fibrillation    (CMS-HCC)  (+) Personal history of colon cancer  (+) Type 2 diabetes mellitus with stage 3a chronic kidney disease, without long-term current use of insulin  (CMS-HCC)

## 2024-09-12 NOTE — BH Treatment Plan (Signed)
 CICU Progress Note  Hospital Day: 11  This patient is critically ill or injured with the impairment of vital organ systems such that there is a high probability of imminent or life threatening deterioration in the patient's condition. This patient must transfer to the ICU for ongoing evaluation of the comprehensive management plan outlined in this note. I directly provided critical care services as documented in this note and the critical care time spent 40 is exclusive of separately billable procedures.  In addition to time spent for critical care management, I also provided advance care planning services for 0 (see GOC above or ACP note for details)". Total billable critical care time 40 minutes.  Chris George, Chris George  Hospital Course:     10/29-11/2: admitted to [facility] from cardiology clinic for significant volume overload s/p inotrope assisted diuresis  11/3: transferred to [facility] for consideration of tricuspid valve intervention 11/4-11/7: evaluated by structural team. Continued diuresis with dobutamine, limited by hypotension 11/8: transferred to CICU for intermittent hypotension   Subjective / Interval History:     Transferred to CICU this afternoon for inotrope assisted diuresis given intermittent hypotension. Feeling fairly well overall. Continues to have moderate lower extremity swelling.   Assessment/Plan:     Principal Problem:   Right ventricular failure    (CMS-HCC) Active Problems:   Permanent atrial fibrillation    (CMS-HCC)   Type 2 diabetes mellitus with stage 3a chronic kidney disease, without long-term current use of insulin  (CMS-HCC)   Major depressive disorder, single episode, mild   Acute on chronic heart failure with preserved ejection fraction (HFpEF) (CMS-HCC)   History of CVA (cerebrovascular accident)   Hyperlipidemia   Hypertension, essential, benign   Severe tricuspid regurgitation   Chronic kidney disease   Chris George is a 68 y.o. male with a past  medical history significant for HFmrEF now fully recovered (LVEF 40% 2018 -->now 55%), severe TR and MR, AFib s/p ablation (on [facility]), ischemic CVA (04/05/23), CKD, recurrent colon CA with liver metastases, local prostate CA s/p XRT 05/2023, chronic smoldering myeloma initially diagnosed in 2013 admitted to [facility] from cardiology clinic for volume overload and ultimately transferred to [facility] for ongoing inotrope supported diuresis and consideration of tricuspid valve intervention.   Neurological  Peripheral neuropathy - Continue home gabapentin  200 mg BID  Hx of CVA  Patient had an ischemic CVA 04/05/23. - continue home atorvastatin  80mg  daily   Pulmonary  NAI  Cardiovascular  HFmrEF dx 2018, now fully recovered  Presented with 30 lbs weight gain (175-->205), bilateral lower extremity edema, orthopnea, severe dyspnea with exertion following recent hospitalization for C diff infection where his diuretics were paused. Secondary to severe valve disease as above. Started on dobutamine assisted diruesis on 10/31 with IV lasix  and metolazone. Patient has been responsive to IV diuretic regimen with little impact on his pre-existing reduced renal function. However, hypotension has limited diuresis. Ultimately transferred to CICU 11/8 for further management.  - concern for cardiac AL amyloidosis given history of multiple myeloma, monoclonal gammopathy workup ordered  - Inotrope support: continue dobutamine 2 mcg/kg/min - Diuresis: IV Lasix  160mg  x1, further diuretics pending triple lumen placement / CVP monitoring  - GDMT: continue spironolactone 25mg  daily  - strict I's&O's  - daily weights  - plan to place triple lumen for CVP and SVO2 monitoring to assist with diuresis   Severe TR Severe MR  TTE on 09/03/24 with severe TR, severe centrally directed MR, and moderately reduced RV systolic fx, likely contributing  to volume overload. The structural heart team recommended transfer from [facility] to [facility] for  further evaluation. Oncology consulted for overall prognosis and feel patient should be considered for cardiac interventions to improve his current comorbidities and his primary oncologist will assess his disease status in January. Structural cardiology consulted and initiated workup for valvular intervention. TRISCEND protocol cardiac CT showing severe biatrial enlargement and dilated right ventricle with low normal systolic function. The tricuspid valve leaflets are not well visualized but appear with normal thickness and mobility. TEE demonstrated severe tricuspid regurgitation with a restricted and short septal leaflet in addition to severe centrally directed mitral regurgitation. Despite dobutamine-assisted diuresis, valvular regurgitation appears largely unchanged.  - continue aggressive diuresis as above - structural heart team consulted and evaluating    Permanent Atrial Fibrillation s/p ablation Patient has undergone AFib ablation in the past per record. Remains in rate controlled atrial fibrillation on telemetry.  - Anticoagulation: Continue home Eliquis  5 BID   Renal  AKI on CKD Baseline creatinine appears to be 2.3-2.6 (eGFR 25-29). Creatinine was 2.35 on admission 10/29 and increased to 2.82 on 11/3 with diuresis. Has remained around 2.6-2.7 range.  - IV diuresis above - Daily BMP  - avoid nephrotoxins    Infectious Disease  NAI  FEN/GI  Regular Diet   Malnutrition Assessment using AND/ASPEN Clinical Characteristics:               Heme/Coag  Metastatic Colon Cancer Prostate Cancer Chronic smoldering Myeloma Follows with [facility]. Patient has recurrent metastatic colon CA, treated recently with dose reduced FOLFIRI, with last PET on 08/25/2024 showing overall improvement, but still residual malignancy. When he last saw oncology on 09/01/24, they had planned for a chemotherapy holiday. He also has localized prostate cancer and completed XRT in  2024. He also has smoldering myeloma, initially diagnosed in 2013. This has not been treated, but most recent bone marrow bx in 2020 showed only 10-15% plasma cells. Oncology consulted to better understand prognosis and believe he should be considered for cardiac interventions to improve his current comorbidities.    Anemia Neutropenia Thrombocytopenia WBC 1.7 with ANC 0.7, Hgb 7.9, and Plt 96 on presentation to [facility] on 10/29. These blood counts have largely been stable, although WBC has risen in response to prednisone  for gout flare. 1u pRBC transfused for hgb 6.5 on 11/4. - Daily CBC  -maintain 2 PIV  Endocrine  NAI - HgbA1c pending    Daily Care Checklist:           Stress Ulcer Prevention:No          DVT Prophylaxis: Chemical:  Yes: Eliquis           HOB > 30 degrees: N/A           Daily Awakening:  N/A          Spontaneous Breathing Trial: N/A          Continued need for central/PICC line : yes  infusions requiring central access and hemodynamic monitoring          Continue urinary catheter for: no   Code Status: Full   Dispo: continue CICU care    Objective:    Vitals - past 24 hours Temp:  [36.3 C (97.3 F)-36.7 C (98 F)] 36.7 C (98 F) Pulse:  [49-80] 60 SpO2 Pulse:  [43-62] 59 Resp:  [10-17] 17 BP: (81-102)/(17-65) 102/41 SpO2:  [96 %-100 %] 100 % Intake/Output I/O last 3 completed shifts: In: 970 [P.O.:570;  I.V.:400] Out: 3650 [Urine:3650]    Weight: 84.6 kg (186 lb 8.2 oz)   Physical Exam:   Physical Exam  Eyes: Pupils are equal, round, and reactive to light.  Cardiovascular: An irregularly irregular rhythm present.  Pulmonary/Chest: Effort normal and breath sounds normal.  Abdominal: Soft. Bowel sounds are normal.  Musculoskeletal:        General: Edema present.  Neurological: He is alert and oriented to person, place, and time.  Skin: Skin is warm and moist.       Continuous Infusions: Infusions Meds[1]  Scheduled Medications: Scheduled  Medications[2]   Vent settings for last 24 hours:    Tubes and Drains: Patient Lines/Drains/Airways Status     Active Active Lines, Drains, & Airways     Name Placement date Placement time Site Days   Port A Cath 09/02/24 Left Subclavian 09/02/24  1430  Subclavian  10   Peripheral IV 09/07/24 Anterior;Left Forearm 09/07/24  1651  Forearm  4   Peripheral IV 09/08/24 Anterior;Right Forearm 09/08/24  1007  Forearm  4   Peripheral IV 09/10/24 Anterior;Distal;Left;Upper Arm 09/10/24  0915  Arm  2            Data Review:  Recent Labs    09/11/24 0710 09/12/24 0417  WBC 5.2 6.7  HGB 7.8* 7.7*  HCT 23.4* 22.8*  PLT 116* 103*   Recent Labs    09/11/24 0929 09/11/24 1823 09/12/24 0417  NA  --  138 139  K  --  4.2 4.3  CL  --  92* 94*  CO2  --  31.0 32.0*  BUN  --  83* 82*  CREATININE  --  2.65* 2.67*  GLU  --  161 107  MG 1.8  --  1.9    No results for input(s): BILITOT, PROT, ALBUMIN, ALT, AST, ALKPHOS in the last 72 hours.  No results for input(s): LABPROT, INR, APTT in the last 72 hours.           [1] . DOBUTamine (DOBUTREX) 2 mcg/kg/min (09/12/24 1230)  [2] . allopurinol   50 mg Oral Every other day  . apixaban   5 mg Oral BID  . atorvastatin   80 mg Oral Daily  . calcitriol   0.25 mcg Oral Daily  . [Provider Hold] furosemide   160 mg Intravenous Daily  . gabapentin   200 mg Oral BID  . heparin , porcine (PF)  500 Units Intravenous Mon,Wed,Fri  . [Provider Hold] metOLazone  5 mg Oral Daily  . psyllium  1 packet Oral BID  . sodium chloride   100 mL Intravenous Once  . spironolactone  25 mg Oral Daily  . tamsulosin   0.4 mg Oral Daily

## 2024-09-12 NOTE — Consults (Signed)
 CVAD Liaison - Port Access Note  Indications:   [employer]  The CVAD Liaison has assessed this patient's port site. Power 20g x 0.75 inch Huber needle size was obtained.  Port was accessed and dressing applied per protocol.  Blood return was noted.  Line flushes freely.     The primary RN was notified.   Thank you for this consult, Millicent MALVA Mania, RN, CVAD Liaison   Consult Time 30 minutes (min)

## 2024-09-12 NOTE — Progress Notes (Signed)
 ------------------------------------------------------------------------------- Attestation signed by Marvell Augustin CROME, MD at 09/13/24 1300 I saw and evaluated the patient, participating in the key portions of the service including testing review and interpretation.  I reviewed the resident's note. I agree with findings and plan, including:  Chris George is currently being treated for HFrEF, acute. Right Heart Failure, with severe exacerbation. Pulmonary Hypertension, with progression. Tricuspid Regurgitation, with severe exacerbation. Mitral Regurgitation, with severe exacerbation.    The patient is at high risk of morbidity from: Loop diuretic requiring intensive monitoring for toxicity (Cr, K levels); inotropic support.  Worsening hypotension since TEE yesterday afternoon; transiently required NE infusion while recovering from anesthesia. Still without signs of poor perfusion, but MAPs have been in the high 30s to low 40s overnight, improving some this morning. I am becoming more concerned about our ability to manage him on the stepdown unit given his advanced heart failure and ongoing need for inotropic support. Discussed with Dr. Drusilla, who agrees he should move to the CICU.   Critical Care Attestation      Chris George requires high complexity decision making for assessment and support, frequent evaluation and titration of therapies, application of advanced monitoring technologies and extensive interpretation of multiple databases. This critical care time includes examining the patient, evaluating the hemodynamic, laboratory, and radiographic data, independently developing a comprehensive management plan, and serially assessing the patient's response to these critical care interventions. This patient is critically ill or injured with the impairment of vital organ systems such that there is a high probability of imminent or life threatening deterioration in the patient's condition.    I directly provided  critical care services as documented in this note and the critical care time spent (40 min) is exclusive of separately billable procedures.    Augustin CROME Marvell, MD -------------------------------------------------------------------------------  Cardiology - Team 1 [facility]) Progress Note  Assessment & Plan:  Chris George is a 68 y.o. male who is presenting to [facility] with Right ventricular failure    (CMS-HCC), in the setting of the following pertinent/contributing co-morbidities: colon CA stage IV w liver mets (on chemo holiday), local prostate CA s/p XRT, smoldering myeloma, CKD3, HFrecEF, A Fib s/p ablation on eliquis , ischemic CVA (04/05/23), and HFpEF.  Principal Problem:   Right ventricular failure    (CMS-HCC) Active Problems:   Permanent atrial fibrillation    (CMS-HCC)   Type 2 diabetes mellitus with stage 3a chronic kidney disease, without long-term current use of insulin  (CMS-HCC)   Major depressive disorder, single episode, mild   Acute on chronic heart failure with preserved ejection fraction (HFpEF) (CMS-HCC)   History of CVA (cerebrovascular accident)   Hyperlipidemia   Hypertension, essential, benign   Severe tricuspid regurgitation   Chronic kidney disease    Active Problems  Severe tricuspid regurgitation Mitral regurgitation TTE on 10/30 showed severe TR, severe centrally directed MR, and moderately reduced RV systolic fx, likely contributing to volume overload. The structural heart team recommended transfer to [facility] for further evaluation. Continued diuresis at [facility] to get closer to dry weight before TT and CT evaluation of TR. Oncology consulted and feel patient should be considered for cardiac interventions to improve his current comorbidities and his primary oncologist will assess his disease status sometime in January. Structural cardiology following, and initiated workup for valvular intervention. TRISCEND protocol cardiac CT showing severe biatrial enlargement and  dilated right ventricle with low normal systolic function. The tricuspid valve leaflets are not well visualized but appear with normal thickness and mobility.  TTE demonstrated severe tricuspid regurgitation with a restricted and short septal leaflet in addition to severe centrally directed mitral regurgitation. After significant dobuatmine-assisted diuresis, valvular regurgitation appears largely unchanged. Expect that continued planning for valvular intervention will take place outpatient. No plan for valve replacement on this admission.  -transfer to ICU for swan-assisted weaning of dobutamine in anticipation of DC and further outpatient discussions regarding valve replacement.    Right ventricular failure Acute on chronic HFpEF Presented with 30 lbs weight gain (175-->205), bilateral lower extremity edema, orthopnea, severe dyspnea with exertion following recent hospitalization for C diff infection where his diuretics were paused. Secondary to severe valve disease as above. Started on dobutamine assisted diruesis on 10/31 with IV lasix  and metolazone. Patient has been responsive to IV diuretic regimen with little impact on his pre-existing reduced renal function. However, hypotension has limited diuresis as he gets closer to euvolemia. Given patient's imaging findings and pmhx of multiple myeloma, concern for presence of cardiac amyloidosis. Further workup will be continued in the CICU. - Continue dobutamine gtt at 2 mcg/kg/min On 11/7 in PM increased rate back to 1 mcg/kg/min then to 2 mcg/kg/min due to hypotension with SBP < 85 Goal to get off of dobutamine in the coming days to allow for discharge home. Will need swan-assisted weaning of dobutamine.  -transfer to CICU  - hold IV lasix  160 mg daily - hold metolazone 5 mg daily - BID BMP - Keep K > 4.0, Mag > 2.0 - Strict I/O, 2L fluid restriction - Telemetry, continuous pulse ox - GDMT: consider initiation of Jardiance pending renal function and  further ongoing diuresis  -spironolactone 25mg     AKI on CKD4 Baseline creatinine appears to be 2.3-2.6 (eGFR 25-29). Creatinine was 2.35 on admission 10/29 and increased to 2.82 on 11/3 with diuresis. Has remained around 2.6-2.7 range.  - Daily BMP for close monitoring - Renally dose medications  Anemia Neutropenia Thrombocytopenia WBC 1.7 with ANC 0.7, Hgb 7.9, and Plt 96 on presentation to [facility] on 10/29. These blood counts have largely been stable, although WBC has risen in response to prednisone  for gout flare. 1u pRBC transfused for hgb 6.5 on 11/4. -Type and screen ordered - Daily CBC with differential -maintain 2 PIV  The patient's presentation is complicated by the following clinically significant conditions requiring additional evaluation and treatment: - Chronic kidney disease POA requiring further investigation, treatment, or monitoring  - Anemia requiring at least daily CBC for further monitoring - Metastatic cancer POA requiring further investigation, treatment, or monitoring   Issues Impacting Complexity of Management: <redacted file path> -Intensive monitoring of drug toxicity from Lasix  with scheduled BMP  Chronic Problems  Permanent Atrial Fibrillation s/p ablation Patient on continuous telemetry, rhythm appears to be atrial fibrillation with delayed ventricular response vs junctional rhythm. Patient not having acute signs of bleeding,  - Continue Eliquis  5 BID   Multiple malignancies Follows with [facility]. Patient has recurrent metastatic colon CA, treated recently with dose reduced FOLFIRI, with last PET on 08/25/2024 showing overall improvement, but still residual malignancy. When he last saw oncology on 09/01/24, they had planned for a chemotherapy holiday. He also has localized prostate cancer and completed XRT in 2024. He also has smouldering myeloma, initially diagnosed in 2013. This has not been treated, but most recent bone marrow bx in  2020 showed only 10-15% plasma cells. Oncology consulted to better understand prognosis and believe he should be considered for cardiac interventions to improve his current comorbidities.  Peripheral neuropathy - Continue home gabapentin  200 mg BID   Concern for gout flare Had been started on prednisone  taper and colchicine  earlier in hospitalization, but discontinued as patient no longer symptomatic.  - Continue allopurinol  50 mg every other day (renal dosing)  - Continue oxycodone  IR 5 mg every 6 hours as needed - Continue Voltaren gel PRN   Constipation - Continue home Metamucil BID - Bowel reg PRN   HLD, hx of CVA - Continue home atorvastatin  80 mg daily.  Daily Checklist: Diet: Regular Diet DVT PPx: On home apixaban  Electrolytes: Replete Potassium to >/=4 and Magnesium  to >/=2 Code Status: Full Code Dispo: Home in the next week, valve replacement not planned for inpatient  Team Contact Information:  Primary Team: Cardiology - Team 1 ([facility]) Primary Resident: Chiquita Mace, MD, MD Resident's Pager: [phone] (Cardiology Team 1 Intern)  Interval History:  Had hypotension significantly lower than baseline overnight. Lactates unremarkable. Patient felt no different at time of hypotension.   Patient feeling well. Notes that he is having trouble understanding how his heart can be so sikc while he overall feels well and is able to talk and ambulate. Patient amenable to continuing workup for valve replacement outpatient. He is willing to transfer to ICU for assistance in weaning dobutamine. He denies SOB, CP, lightheadedness, abdominal pain.    ROS: Denies headache, chest pain, shortness of breath, abdominal pain, nausea, vomiting.  Objective:  Temp:  [36.3 C (97.3 F)-36.7 C (98 F)] 36.7 C (98 F) Pulse:  [49-80] 60 SpO2 Pulse:  [43-62] 59 Resp:  [10-17] 17 BP: (81-102)/(17-65) 102/41 SpO2:  [96 %-100 %] 100 %,  Intake/Output Summary (Last 24 hours) at 09/12/2024  1330 Last data filed at 09/12/2024 1230 Gross per 24 hour  Intake 1309.46 ml  Output 1800 ml  Net -490.54 ml    Gen: NAD, converses  HENT: atraumatic, normocephalic Heart: Bradycardic, systolic murmur appreciated Lungs: CTAB, no crackles or wheezes, decreased breath sounds at lung bases Abdomen: soft, NTND, bowel sounds present. Extremities: skin taut distal to mid shin. 2+ pitting edema in BLE. Trace edema proximal to bilateral knees.

## 2024-09-12 NOTE — Care Plan (Signed)
 Shift Summary Magnesium  oxide was administered in the afternoon to address electrolyte needs.   Cardiac rhythm alternated between junctional rhythm and atrial fibrillation, with ECG confirming no significant change compared to previous studies.   Urine output was present at both the beginning and end of the shift, with no stool output recorded.   Bruising persisted, but skin protection and pressure reduction interventions were maintained.   Overall, heart rate and rhythm, fluid balance, and skin integrity were managed with ongoing monitoring and interventions.   Electrolyte Balance: Electrolyte levels remained mostly within normal limits except for low chloride and calcium , with magnesium  oxide administered during the shift; renal function markers were elevated.   Stable Heart Rate and Rhythm: Heart rate improved from an initial low value and remained within a moderate range, while cardiac rhythm alternated between junctional rhythm and atrial fibrillation, with no significant change noted on ECG.   Fluid and Electrolyte Balance: Urine output was present at the start and end of the shift, with no stool output recorded; BUN and creatinine were elevated, and oral intake was limited after the morning.   Skin Health and Integrity: Bruising persisted throughout the shift, but skin color remained appropriate for ethnicity, Braden score was high, and pressure reduction techniques and specialty bed were utilized.

## 2024-09-12 NOTE — Consults (Signed)
 CVAD Liaison - Insertion Note   The CVAD Liaison was contacted for the insertion of Central Venous Access Device (CVAD).  A chart review performed.  Indication: Vasopressors  Prior to the start of the procedure, a time out was performed and the identity of the patient was confirmed via name, medical record number and date of birth.  The Central Line Checklist was referenced.  The sterile field was prepared with necessary supplies and equipment verified.  Insertion site was prepped with chlorhexidine  and allowed to dry.  Maximum sterile techniques was utilized.  CVAD was inserted by Klett, K PA.  Catheter was aspirated and flushed.  After line was placed and secured by provider, the insertion site cleansed and sterile dressing applied per manufacturer guidelines by CVAD Liaison.   CVAD Liaison was present during entire procedure.  Report of the procedure given to the Primary Nurse.   Thank you for this consult, Millicent MALVA Mania, RN, CVAD Liaison   Consult Time 75 minutes

## 2024-09-13 DIAGNOSIS — I340 Nonrheumatic mitral (valve) insufficiency: Secondary | ICD-10-CM | POA: Insufficient documentation

## 2024-09-13 NOTE — Care Plan (Signed)
 Pt A&O x4. Some mild initial forgetfulness with certain word-finding/sentences that patient stated is new baseline since CVA; for the most part, this is not too noticeable. Follows commands. Denies complaints of pain. HR 50s-60s, A-fib/SB with some PVCs. ~2342, some ventricular bigeminy. Pt asymptomatic, sleeping comfortably. Provider notified. Electrolytes checked per order. Systolic BP goal >14. Remains on dobutamine gtt, do not titrate order. Pulses palpable. Pitting edema to BLE and bilateral feet. Afebrile. Stable on room air. Regular diet. 2L fluid restriction maintained. LBM 09/12/24. Voiding via urinal. Diuresed per orders. Maintains patent IV/line access. Bathed on dayshift, declined additional bath overnight. Safety precautions in place. Handoff given to CICU RN.  Shift Summary DOBUTamine gtt throughout the shift, along with diuretics metOLazone and bumetanide , to support cardiac function and fluid management. Fall prevention and infection control interventions were maintained without incident. Pain was well controlled, and comfort measures were effective until a change in level of consciousness was noted late in the shift. Overall, the shift was marked by ongoing cardiac and renal management, stable pain control, and consistent safety interventions.  Absence of Hospital-Acquired Illness or Injury: Aseptic technique and infection prevention measures were maintained throughout the shift, and skin protection interventions were consistently applied; no new hospital-acquired issues were documented.  Optimal Comfort and Wellbeing: Pain remained at 0 throughout the shift, and orientation was stable until a positive altered level of consciousness was noted at the end of the shift.  Readiness for Transition of Care: Unplanned readmission score increased slightly during the shift, and there were no major changes in psychosocial status or activity level; however, lab results showed persistent renal  dysfunction and hyperglycemia.  Absence of Fall and Fall-Related Injury: Fall prevention interventions, including hourly checks, bed rails, and toileting assistance, were consistently implemented, and no falls were reported during the shift.

## 2024-09-13 NOTE — Care Plan (Signed)
 Shift Summary Multiple cardiac medications including DOBUTamine, metOLazone, and bumetanide  were administered during the shift to support cardiac function and fluid management. Provider was notified of ventricular bigeminy, and orders were updated in response. Basic Metabolic Panel revealed persistent renal dysfunction and hyperglycemia, with eGFR remaining low and BUN/creatinine elevated. No falls, injuries, or new hospital-acquired conditions were documented, and pain remained well controlled throughout the shift. Overall, the shift was marked by ongoing management of cardiac and renal issues with stable comfort and safety measures in place.  Absence of Hospital-Acquired Illness or Injury: No new hospital-acquired conditions documented during the shift; infection prevention and aseptic technique were maintained, and skin protection interventions were consistently applied. No changes in sepsis risk score were observed, and device-related skin protection and positioning were performed as scheduled.  Optimal Comfort and Wellbeing: Pain scores remained at 0 throughout the shift, and orientation and cognition were consistently appropriate. No discomfort or distress was documented, and psychosocial status was within defined limits.  Readiness for Transition of Care: Unplanned readmission score increased slightly over the shift, and basic metabolic panel showed persistent renal dysfunction and hyperglycemia. No acute changes in orientation or cognition were noted, but continued monitoring and management of cardiac and renal status are ongoing.  Absence of Fall and Fall-Related Injury: No falls or injuries occurred; fall prevention interventions such as hourly checks, toileting, and bed safety were maintained throughout the shift.

## 2024-09-13 NOTE — Care Plan (Signed)
 Shift Summary midodrine and bumetanide  were administered in the morning to address hemodynamics and fluid status.  Two ECGs were performed, both confirming atrial fibrillation with slow ventricular response and right bundle branch block, with no significant change from prior.  Labs drawn in the afternoon revealed persistent renal dysfunction and some electrolyte abnormalities, magnesium  replaced Fall prevention and skin integrity interventions were consistently maintained throughout the shift.  Overall, interventions and monitoring were maintained with no acute events or injuries documented during the shift. Pending Cardiac MRI

## 2024-09-14 NOTE — Consults (Signed)
Additional order received and the patient is already on caseload. Continue with plan of care.

## 2024-09-14 NOTE — Nursing Note (Addendum)
 Physical Therapy Progress Note (09/14/24 0830)   Patient Name:  Chris George      Medical Record Number: [id number]  Date of Birth: 05/19/1956 Sex: Male     Post-Discharge Physical Therapy Recommendations: PT Post Acute Discharge Recommendations: 5x weekly, High intensity  Barriers to Discharge: Inaccessible home environment, Endurance deficits, Gait instability, Impaired Balance, Inability to safely perform ADLS Equipment Recommendation PT DME Recommendations: Defer to post acute       Treatment Diagnosis: Abnormalities of gait and mobility   Assessment/Session Summary Problem List: Increased edema, Gait deviation, Impaired ADLs, Fall risk, Decreased mobility, Decreased endurance, Impaired balance  Assessment: RN cleared patient for PT evaluation. Chris George is a 68 y.o. y.o. male admitted with Right ventricular failure    (CMS-HCC). Session focused on assessment of baseline function, current mobility status, physiologic response to activity, and safety with mobility tasks. Chris George participated in transfer training, gait training, sitting edge of bed, and standing balance activities requiring contact guard - minimal assistance (during turns and to correct 2 lateral losses of balance) with IV pole. Session limited by deconditioning, impaired cardiopulmonary endurance, balance impairment, and gait instability. Vital signs were stable throughout with no concerning clinical response.  Chris George demonstrated function below baseline mobility level with an AMPAC "6-Clicks" Basic Mobility score of 17, indicating a functional impairment between 40-60%. PT services are indicated to address impaired activity tolerance, deconditioning, and functional mobility decline in the setting of critical illness. Plan to progress mobility as tolerated during hospitalization with post-acute recommendation of 5xH. Goals, plan of care, and discharge recommendation updated to reflect patient's current  functional status.  Skilled PT interventions: . Assessment of bed mobility, transfers, sitting/standing tolerance, and/or ambulation as appropriate . Verbal and tactile cueing for postural alignment, movement sequencing, and weight shifting . Instruction in pacing and breathing strategies (e.g., pursed-lip, diaphragmatic breathing) . Continuous monitoring of vital signs, hemodynamics, and symptoms to determine safe activity tolerance . Positioning recommendations to optimize ventilation and promote skin integrity . Patient/family education regarding role of PT, benefits of early mobility, mobility expectations in the ICU, and safe progression strategies  Activity Tolerance: Limited by fatigue  Ambulation Level of Assistance: Contact guard assist, steadying assist, Minimal assist, patient does 75% or more Assistive Device: Other (Comment) (IV pole) Distance Ambulated (ft): 300 ft Ambulation comments: see gait goal    Plan Planned Frequency of Treatment: Plan of Care Initiated: 09/14/24 1-2x per day Weekly Frequency: 4-5 days per week Planned Treatment Duration: 09/28/24 Planned Interventions: Education (Patient/Family/Caregiver), Self-care / Home Management training, Therapeutic Exercise, Therapeutic Activity, Gait training, Home exercise program   Subjective Medical Updates Since Last Visit/Relevant PMH Affecting Clinical Decision Making: upgraded to CICU Equipment / Environment: Vascular access (PIV, TLC, Port-a-cath, PICC), Telemetry  Patient reports: Agreeable to PT session Pain Comments: 0/10   Objective SHORT GOAL #1: Patient will complete all functional transfers independently.              Date Established : 09/14/24             Time Frame : 2 weeks                        Plan : In Progress, Goal Revised this Date  SHORT GOAL #2: Patient will ambulate household distances independently.             Date Established : 09/07/24  Time Frame : 2 weeks                          Plan : In Progress, Goal Revised this Date SHORT GOAL #3: Patient will navigate stairs necessary to navigate home independently.             Date Established : 09/14/24             Time Frame : 2 weeks                         Plan : In Progress Long Term Goal #1: Patient will ambulate community distances independently. Time Frame: 4 weeks  Cognition Cognition: WFL Orientation: Oriented x4  Precautions / Restrictions Precautions: Falls precautions              Vitals  Pre-session: BP: 108/54 (70), HR: 64, RR: 13, SpO2: 99 on room air Post-session: BP: 97/67 (72), HR: 64, RR: 12, SpO2: 93 on room air Infusions Meds[1]  Patient at end of session: All needs in reach, Lines intact, Notified Nurse (sitting edge of bed)  Physical Therapy Session Duration PT Individual [mins]: 40    I attest that I have reviewed the above information. Signed: Glennon Newcomer, PT, DPT, CCS, RYT 09/14/2024          [1] . DOBUTamine (DOBUTREX) 2 mcg/kg/min (09/14/24 1000)

## 2024-09-14 NOTE — Consults (Signed)
  CVAD Liaison Consult  CVAD Liaison Nurse was consulted for deaccess of port since patient has three PIV's and a triple lumen CVC.   Blood return brisk, lumen flushed. Heparin  instilled to dwell. Huber needle removed per protocol. 2x2 and tegaderm applied.  Thank you for this consult, Millicent MALVA Mania, RN, [facility] Liaison  Consult Time 15 minutes (min)

## 2024-09-14 NOTE — Care Plan (Signed)
 Shift Summary Significant dressing changes were performed on all peripheral IV sites during the shift.    Improved Ability to Complete Activities of Daily Living: Mobility remained slightly limited but independent after set-up, with no symptoms noted during activity and frequent repositioning and pressure reduction techniques provided throughout the shift.   Electrolyte Balance: Magnesium  level increased from 1.9 mg/dL to 2.7 mg/dL after two administrations of magnesium  sulfate, and other electrolytes such as sodium and potassium remained within normal limits, though calcium  and chloride were low.    Fluid and Electrolyte Balance: Urine output was consistent and urine appearance remained clear and yellow/straw, but BUN and creatinine were persistently high and eGFR was low, indicating ongoing renal concerns.   Skin Health and Integrity: Skin protection interventions were maintained, dressings were changed on peripheral IV sites, and skin assessments noted bruising but no symptoms of infiltration or phlebitis.

## 2024-09-14 NOTE — Consults (Signed)
 OCCUPATIONAL THERAPY Evaluation (09/14/24 1358)  Patient Name:  Chris George      Medical Record Number: [id number]   Date of Birth: 09/09/1956 Sex: Male    Post-Discharge Occupational Therapy Recommendations: 5x weekly, High intensity  Equipment Recommendation OT DME Recommendations: Defer to post acute, Tub Transfer Bench     OT Treatment Diagnosis: Generalized muscle weakness, Limitation of activities due to disability, Unsteadiness on feet, Need for assistance with personal care, Reduced mobility    Assessment Assessment: Mr. Stalling is a 68 y.o. 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   Patient seen for initial OT evaluation and occupational profile. With consideration of patient's occupational profile, assessment review, level of clinical decision making involved, and intervention plan, patient presents as a moderate complexity case w/ the following functional deficits: decreased activity tolerance , impaired dynamic balance , deficits in cognitive processing , impaired safety awareness, and increased fall risk  that impact independent participation in ADLs. Based on current status, he requires up to Min-A for ADLs, as well as would require assist for IADLs (cooking, cleaning). Pt lives alone and has 2 flights of stairs he must be able to safely navigate to enter his home. Will benefit from post-acute OT 5x/high week to maximize safety and functional independence.    Problem List: Decreased strength, Decreased activity tolerance, Increased  edema, Decreased mobility, Fall risk, Impaired ADLs, Impaired balance, Decreased safety awareness, Decreased cognition  Clinical Decision Making: Moderate Complexity  Today's Interventions: EDUCATION: role of OT, OT POC, hospital safety, fall prevention, pulmonary hygiene, benefits of sitting upright in chair throughout day, educated on TherEx to complete while seated in chair (knee extension, high knee marches, ankle pumps), importance of maintaining routines while in hospital               INTERVENTIONS: bed mobility, LBD tasks, transfer to chair, pt completed toileting via urinal while seated EOB (set-up)  Activity Tolerance During Today's Session Tolerated treatment well  Plan Planned Frequency of Treatment: Plan of Care Initiated: 09/14/24 1-2x per day Weekly Frequency: 3-4 days per week Planned Treatment Duration: 09/28/24  Planned Interventions:  Clinical Research Associate, Self-Care/Home Training, Education (Patient/Family/Caregiver), Therapeutic Exercise, Therapeutic Activity, Home Exercise Program    GOALS:  Patient and Family Goals: None stated on evaluation  Short Term:  SHORT GOAL #1: Will complete full body dressing + item retreival with mod-I  Time Frame : 2 weeks SHORT GOAL #2: Will complete toilet transfer + toielting tasks with Mod-I  Time Frame : 2 weeks SHORT GOAL #3: Will complete >10 minutes of standing IADLs with supervision, with RPE <3/10  Time Frame : 2 weeks SHORT GOAL #4: Will complete assessment of medication management skills  Time Frame : 2 weeks  Long Term Goal #1: Will score 24/24 on AMPAC in 5 weeks  Prognosis:  Good Positive Indicators:  Motivation Barriers to Discharge: Inaccessible home environment, Endurance deficits, Gait instability, Impaired Balance, Inability to safely perform ADLS  Subjective Medical Updates Since Last Visit/Relevant PMH Affecting Clinical Decision Making:   Prior Functional Status PTA, pt was independent for ADLs,  IADLs  and mobility (no device). Pt reports driving and doing his own grocery shopping. Reports he goes to his mother's house every Sunday for dinner (eats with hi mom and brother). Has been doing ongoing speech therapy.  Living Situation Living Environment: Apartment Lives With: Alone Home Living: One level home, Stairs to enter with rails, Tub/shower unit, Standard height toilet Rail placement (outside): Bilateral rails in reach Number of Stairs to Enter (outside): 22   Equipment available at home: Straight cane, Rolling walker  Medical Tests / Procedures: Reviewed in EPIC     Patient / Caregiver reports: I was about to take a nap.SABRASABRAI get tired after I eat   Past Medical History[1] Social History   Tobacco Use  . Smoking status: Never    Passive exposure: Never  . Smokeless tobacco: Never  Substance Use Topics  . Alcohol use: Not Currently    Comment: not in over 20 years per pt    Past Surgical History[2] Family History[3]   Patient has no known allergies.   Objective Findings Precautions / Restrictions  Falls precautions    Weight Bearing  Precautions: Falls precautions Required Braces or Orthoses: Non-applicable Required Braces or Orthoses  Non-applicable Communication Preference  Verbal     Pain  Denied pain.  Equipment / Environment  Vascular access (PIV, TLC, Port-a-cath, PICC), Telemetry Cognition  Orientation Level:  Oriented x 4  Arousal/Alertness:  Appropriate responses to stimuli  Attention Span:  Attends with cues to redirect (tangential at times)  Memory:  Decreased short term memory  Following Commands:  Follows one-step commands  Safety Judgment:   (required a few reminders for safety with lines/leads)  Awareness of Errors and Problem Solving:     Comments: difficulty with word-finding (reports has been working on this with speech therapy)  Vision / Hearing  Vision: No acute deficits identified Hearing: No deficit identified   Hand  Function: Right Hand Function: Right hand grip strength, ROM and coordination WNL Left Hand Function: Left hand grip strength, ROM and coordination WNL  Skin Inspection: Skin Inspection: Intact where visualized  ROM / Strength: UE ROM/Strength: Left WFL, Right WFL LE ROM/Strength: Right WFL, Left WFL  Sensation: Sensory/ Proprioception/ Stereognosis comments: reports he gets intermittent tingling in B feet  Balance: Static Sitting-Level of Assistance: Supervision Dynamic Sitting-Level of Assistance: Supervision (when leaning forward to pull up socks) Static Standing-Level of Assistance: Stand by assistance (no device)  Functional Mobility Transfers: Standby assist (for sit > stand from EOB (no device)) Bed Mobility : Supervision (with HOB raised)  ADLs Feeding : Set Up Assist, Performed seated Grooming:  (pt deferring; anticipate standby for standing grooming tasks) Bathing: Min assist, Performed seated Toileting:  (recommend 1-staff assist for up to toilet) UB Dressing: Min assist, Performed seated (changed hospital gown) LB Dressing:  (able to doff/don socks while seated in bedside chair independently via figure-4 positioning)  Vitals / Orthostatics Vitals/Orthostatics: Sitting EOB HR 61   BP  98/61 (71)   SPO2: 100%  Patient at end of session: All needs in reach, In chair, Lines intact, Notified Nurse   Occupational Therapy Session Duration OT Individual [mins]: 36      AM-PAC-6 click Help currently need turning over In bed?: A Little - Minimal/Contact Guard Assist/Supervision Help currently needed sitting down/standing up from chair with arms? : A Little - Minimal/Contact Guard Assist/Supervision Help currently needed moving from supine to sitting on edge of bed?: A Little - Minimal/Contact Guard Assist/Supervision Help currently needed moving to and from  bed from wheelchair?: A Little - Minimal/Contact Guard Assist/Supervision Help currently needed walking in a  hospital room?: A Little - Minimal/Contact Guard Assist/Supervision Help currently needed climbing 3-5 steps with railing?: A lot - Maximum/Moderate Assistance  Basic Mobility Score 6 click: 17  6 click Score (in points): % of Functional Impairment, Limitation, Restriction 6: 100% impaired, limited, restricted 7-8: At least 80%, but less than 100% impaired, limited restricted 9-13: At least 60%, but less than 80% impaired, limited restricted 14-19: At least 40%, but less than 60% impaired, limited restricted 20-22: At least 20%, but less than 40% impaired, limited restricted 23: At least 1%, but less than 20% impaired, limited restricted 24: 0% impaired, limited restricted  'AM-PAC' forms are Copyright protected by The [employer] of [facility]    I attest that I have reviewed the above information. Signed: Lauraine DELENA Hutch, OT Filed 09/14/2024           [1] Past Medical History: Diagnosis Date  . Colon cancer    (CMS-HCC)   . Multiple myeloma (CMS-HCC)   . Prostate cancer    (CMS-HCC)   . Stroke (cerebrum) (CMS-HCC) 04/09/2023  [2] Past Surgical History: Procedure Laterality Date  . COLONOSCOPY    . PR RIGHT HEART CATH O2 SATURATION & CARDIAC OUTPUT N/A 11/27/2023   Procedure: Right Heart Catheterization;  Surgeon: Gil Franky Barter, MD;  Location: [facility] CATH;  Service: Cardiology  [3] Family History Problem Relation Age of Onset  . Heart murmur Father   . Hyperlipidemia Maternal Aunt   . Hypertension Maternal Aunt   . Heart murmur Paternal Uncle

## 2024-09-15 ENCOUNTER — Ambulatory Visit

## 2024-09-15 ENCOUNTER — Other Ambulatory Visit

## 2024-09-15 ENCOUNTER — Ambulatory Visit: Admitting: Oncology

## 2024-09-15 NOTE — Progress Notes (Signed)
 ------------------------------------------------------------------------------- Attestation signed by Brock Odell Ned, MD at 09/15/24 2055 Attending Physician Attestation:    I confirm that I interviewed and examined the patient, reviewed all the relevant lab and diagnostic data, and we agreed upon a plan of treatment. I agree with the findings, assessment, and plan as documented by Dr. Janett.  Odell Brock, MD  -------------------------------------------------------------------------------  Cardiology - Team 1 [facility]) Transfer Note  Assessment & Plan:  Chris George is a 68 y.o. 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 Plan for outpatient follow up with valve clinic.   Principal Problem:   Right ventricular failure    (CMS-HCC) Active Problems:   Permanent atrial fibrillation    (CMS-HCC)   Type 2 diabetes mellitus with stage 3a chronic kidney disease, without long-term current use of insulin  (CMS-HCC)   Major depressive disorder, single episode, mild   Acute on chronic heart failure with preserved ejection fraction (HFpEF) (CMS-HCC)   History of CVA (cerebrovascular accident)   Hyperlipidemia   Severe tricuspid regurgitation   Chronic kidney disease   Hypotension   Severe mitral regurgitation  Active Problems  HFmrEF dx 2018, now fully recovered  Hypotension  Home regimen includes: Spironolactone 25mg  daily, jardiance 10mg  daily and PO bumex  3mg  daily (patient unsure if this dosing is correct). Presented to clinica  appointment with 30 lbs weight gain (175-->205), bilateral lower extremity edema, orthopnea, severe dyspnea with exertion following recent hospitalization for C diff infection where his diuretics were paused. Secondary to severe valve disease as above. Admitted to [facility] and started on dobutamine assisted diruesis on 10/31 with IV lasix  and metolazone. Patient has been responsive to IV diuretic regimen with little impact on his pre-existing reduced renal function. However, hypotension has limited diuresis. Ultimately transferred to CICU 11/8 for further management. Continuing inotrope assisted diuresis. 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  TTE 09/03/2024: EF 55%, LV upper normal in size w/ mild increased wall thickness; RV severely dilated w/ mod reduced systolic function (worsened compared to 06/15/24); severe centrally directed MR; severe TR  TTE 09/11/24: EF >55%, severe MR, severe TR  - GDMT: - Hold home spiro iso AKI and hypotension  - Diuresis: - Hold diuresis today iso euvolemia  - Plan to restart PO regimen tomorrow: plan for PO Bumex  2mg  BID  - Inotropic support: - Wean dobutamine to 1 mcg/kg/min, plan to wean off this afternoon - Trend SvO2 with wean  - Continue midodrine 5 mg TID - Strict I/Os - Maintain K >4; Mg >2 - BMP/Mag q12h - daily weights  - CVP monitoring - SVO2 q12h - 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 The structural heart team recommended transfer from [facility] to [facility] for further evaluation.  Oncology consulted for overall prognosis and feel patient should  be considered for cardiac interventions to improve his current comorbidities and his primary oncologist will assess his disease status in January. Structural cardiology consulted and initiated workup for valvular intervention. TRISCEND protocol cardiac CT showing . TEE demonstrated severe tricuspid regurgitation with a restricted and short septal leaflet in addition to severe centrally directed mitral regurgitation. Despite dobutamine-assisted diuresis, valvular regurgitation appears largely unchanged.  Cardiac CT TAVR 09/10/24: Severe biatrial enlargement; dilated right ventricle with low normal systolic function; TV leaflets are not well visualized but appear with normal thickness and mobility; MV leaflets appear normal in thickness with normal mobility and no notable mitral annular calcification TEE 09/11/24: Severe centrally directed MR; severe TR with restricted and short septal leaflet; LV systolic function overall normal; LA severely dilated; RV systolic function mildly reduced; no LAA thrombus - diuresis as above - structural heart team consulted, appears no plan for inpatient intervention at this time  - Plan for outpatient follow up with valve clinic   AKI on CKD IV Baseline creatinine appears to be 2.3-2.6 (eGFR 25-29). Creatinine was 2.35 on admission 10/29 --> 2.82 on 11/3 with diuresis --> steady at 2.6-2.7 for several days --> now steadily increasing, 3.08 on 11/11. 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 - Hold IV diuresis above - BMP q12h - Renally dose as appropriate/avoid nephrotoxins  Atrial Fibrillation  Patient has undergone AFib ablation in the past per record. previously in rate controlled atrial fibrillation on telemetry, now in junctional bradycardia  - Anticoagulation: Continue home Eliquis  5 BID - Continue to monitor telemetry    Anemia Neutropenia Thrombocytopenia Patient has chronic anemia, neutropenia and thrombocytopenia all of which are followed by outpatient Oncologist.  WBC 1.7 with ANC 0.7, Hgb 7.9, and Plt 96 on presentation to [facility] on 10/29. These blood counts have largely been stable, although WBC has risen in response to prednisone  for gout flare. 1u pRBC transfused for hgb 6.5 on 11/4. - Daily CBC  - Active T&S  The patient's presentation is complicated by the following clinically significant conditions requiring additional evaluation and treatment: - Disorders of electrolytes, volume status, and acid/base status: - Volume overload requiring further investigation, treatment, or monitoring    Issues Impacting Complexity of Management: <redacted file path> -The patient is at high risk of complications from HFrEF   Medical Decision Making: Reviewed records from the following unique sources  in EMR, including recent clinic notes or hospital course.  Chronic Problems  Metastatic Colon Cancer Prostate Cancer Chronic smoldering Myeloma Follows with [facility]. Patient has recurrent metastatic colon CA, treated recently with dose reduced FOLFIRI, with last PET on 08/25/2024 showing overall improvement, but still residual malignancy. When he last saw oncology on 09/01/24, they had planned for a chemotherapy holiday. He also has localized prostate cancer and completed XRT in 2024. He also has smoldering myeloma, initially diagnosed in 2013 and treated in [place], [place] with single agent Velcade. He declined maintenance treatment or referral for bone marrow transplant. Currently being monitored by oncologist. Most recent bone marrow bx in 2020 showed only 10-15% plasma cells.  - Per oncology, metastatic colon cancer is stable and patient should be considered for valvular intervention if he is a candidate  - consider re-consulting pending results of cardiac AL amyloid workup   Peripheral neuropathy - Continue home gabapentin  200 mg BID  Constipation - Continue home Metamucil BID   DM2  HgbA1c up to 9.8 on 12/17/22. HgbA1c on admission 5.5.  Not  on any home meds, appears to be well controlled with lifestyle modifications - Monitor blood glucose on BMP  Gout Flare (resolved) Had been started on prednisone  taper and colchicine  earlier in hospitalization, but discontinued as patient no longer symptomatic.  - Continue allopurinol  50 mg every other day (renal dosing)  - Continue oxycodone  IR 5 mg q6hrs PRN - Continue Voltaren gel PRN  Hx of CVA  Aphasia  Patient had an ischemic CVA 04/05/23 with ongoing aphasia.  - Continue home atorvastatin  80mg  daily    Daily Checklist: Diet: Regular Diet DVT PPx: Patient Already on Full Anticoagulation with eliquis  Electrolytes: Replete Potassium to >/=4 and Magnesium  to >/=2 Code Status: Full Code Dispo: Transfer to [facility]  Team Contact Information:  Primary Team: Cardiology - Team 1 (MEDC1) Primary Resident: Nat Pour, MD, MD Resident's Pager: [phone] (Cardiology Team 1 Senior Resident)   Interval history: 10/29-11/2: admitted to [facility] from cardiology clinic for significant volume overload s/p inotrope assisted diuresis  11/3: transferred to [facility] for consideration of tricuspid valve intervention 11/4-11/7: evaluated by structural team. Continued diuresis with dobutamine, limited by hypotension 11/8: transferred to CICU for intermittent hypotension. IV lasix  160mg  x1 + IV Bumex  6mg  x1 + metolazone 5mg  x1 11/9: IV Bumex  4mg  x1. Stop MRA. Started midodrine. Cardiac MRI ordered 11/10: IV Bumex  4mg  x2. Port de-accessed. Plan for MRI.  11/11: Wean dobutamine off, hold diuresis.   Nat Pour, MD Internal Medicine PGY2

## 2024-09-15 NOTE — Anesthesia Postprocedure Evaluation (Signed)
 Patient: SHAQUAN MISSEY  Scheduled: * No procedures listed *  Anesthesia Start Date/Time: 09/11/24 1444   Scheduled providers: Reddinger, Jayson Colorado, CRNA; Bryant Agent, MD   Procedure: ECHOCARDIOGRAM TRANSESOPHAGEAL ECHO   Indications: evaluate MR for clip candidacy and TR with tricsend/evoque  protocol   Location: IMG  EP ELECTROPHYSIOLOGY [facility]     Final Anesthesia Type: general, TIVA  Anesthesia Staff: Anesthesiologist: Emory Morna Garre, MD Anesthesia Provider: Reddinger, Jayson Colorado, CRNA     No lines, drains, or airways are recorded for this episode.     Patient Location: 4 CICU UNCAD  Last vitals:  Vitals Value Taken Time  BP 102/50 09/15/24 08:00  Temp 36.7 C (98.1 F) 09/15/24 08:00  Pulse 57 09/15/24 08:00  Resp 10 09/15/24 08:00  SpO2 99 % 09/15/24 08:00  SpO2 Pulse        Level of consciousness: alert and awake Post vitals: stable PONV Present? no Pain score: 0 Pain management: adequate Airway patency: patent Cardiovascular status: stable and acceptable Respiratory status: acceptable, nonlabored ventilation and spontaneous ventilation Hydration status: acceptable    PACU PONV Meds: None  PACU Pain Meds: None  Anesthetic complications: There were no known notable events for this encounter.   _______________ Morna LOISE Emory, MD 09/15/24

## 2024-09-15 NOTE — Care Plan (Signed)
 Shift Summary DOBUTamine was administered  with a rate decrease early in the shift & then stopped per orders.  Respiratory rate peaked in the afternoon but SpO2 remained high on room air.  Sepsis risk score briefly increased in the afternoon before returning to baseline.  Assistance was required for bathing and hygiene, but independence was maintained for mobility and oral care.  Overall, remained stable in oxygenation and cardiac monitoring, with infection prevention measures in place.  Improved Ability to Complete Activities of Daily Living: Remained independent after set-up for mobility and oral care, but continued to need assistance with bathing and minimal assist for general hygiene throughout the shift.  Stable Heart Rate and Rhythm: Junctional rhythm and right bundle branch block persisted, with occasional ectopy and murmur noted; cardiac symptoms were not documented, and telemetry monitoring was maintained.  Effective Oxygenation and Ventilation: Respiratory rate fluctuated, peaking at 32 before returning to lower values, while SpO2 remained consistently at or near 100% on room air; venous O2 saturation decreased from 78.0% to 71.1%.  Absence of Infection Signs and Symptoms: Sepsis risk score was stable for most of the shift with a brief increase, temperature remained unchanged, and aseptic technique and hand hygiene were maintained.

## 2024-09-15 NOTE — Care Plan (Signed)
 Pt GCS 15 on RA. Afib/Junctional rhythm noted on bedside monitor. Dobutamine infusing per order. 1 BM this shift, voids via urinal. Pt education provided at bedside, POC ongoing.   Problem: Adult Inpatient Plan of Care Goal: Absence of Hospital-Acquired Illness or Injury Intervention: Prevent Skin Injury Recent Flowsheet Documentation Taken 09/15/2024 0600 by Claudene Console, RN Positioning for Skin: Supine/Back Taken 09/15/2024 0400 by Claudene Console, RN Positioning for Skin: Right Intervention: Prevent Infection Recent Flowsheet Documentation Taken 09/15/2024 0600 by Claudene Console, RN Infection Prevention: . rest/sleep promoted . single patient room provided . hand hygiene promoted Goal: Optimal Comfort and Wellbeing Outcome: Shift Focus   Problem: Heart Failure Goal: Stable Heart Rate and Rhythm Outcome: Shift Focus Goal: Fluid and Electrolyte Balance Outcome: Shift Focus Goal: Effective Oxygenation and Ventilation Intervention: Promote Airway Secretion Clearance Recent Flowsheet Documentation Taken 09/15/2024 0600 by Claudene Console, RN Activity Management: . standing at bedside . back to bed Intervention: Optimize Oxygenation and Ventilation Recent Flowsheet Documentation Taken 09/15/2024 0600 by Claudene Console, RN Head of Bed [facility]) Positioning: HOB at 30-45 degrees Taken 09/15/2024 0400 by Claudene Console, RN Head of Bed [facility]) Positioning: (Flat for CVP) HOB at 30-45 degrees   Problem: Skin Injury Risk Increased Goal: Skin Health and Integrity Intervention: Optimize Skin Protection Recent Flowsheet Documentation Taken 09/15/2024 0600 by Claudene Console, RN Activity Management: . standing at bedside . back to bed Pressure Reduction Techniques: frequent weight shift encouraged Head of Bed (HOB) Positioning: HOB at 30-45 degrees Pressure Reduction Devices: specialty bed utilized Taken 09/15/2024 0400 by Claudene Console, RN Pressure Reduction Techniques: frequent  weight shift encouraged Head of Bed (HOB) Positioning: (Flat for CVP) HOB at 30-45 degrees Pressure Reduction Devices: specialty bed utilized   Problem: Infection Goal: Absence of Infection Signs and Symptoms Outcome: Shift Focus Intervention: Prevent or Manage Infection Recent Flowsheet Documentation Taken 09/15/2024 0600 by Claudene Console, RN Infection Management: aseptic technique maintained

## 2024-09-16 NOTE — Care Plan (Signed)
 Shift Summary MAP dropped significantly during the shift but improved after intervention.   Cardiac rhythm remained atrial fibrillation, with a decrease in ectopy frequency.   Fall reduction and safety interventions were maintained without incident.   Comfort was preserved with no reported pain or discomfort.   Infection prevention measures were consistently followed, and temperature stayed within normal range.   Optimal Comfort and Wellbeing: Pain remained at 0 throughout the shift and no discomfort was reported; rest and sleep were promoted.   Absence of Fall and Fall-Related Injury: Fall reduction interventions were consistently maintained, including hourly visual checks, bed alarms, and toileting every 2 hours, with no falls or injuries documented.   Stable Heart Rate and Rhythm: Cardiac rhythm was atrial fibrillation for most of the shift, with a brief period of junctional rhythm; ectopy frequency decreased from frequent to occasional, and MAP fluctuated but improved after a transient drop.   Absence of Infection Signs and Symptoms: Aseptic technique and hand hygiene were maintained, and temperature remained within normal limits.

## 2024-09-16 NOTE — Telephone Encounter (Signed)
 Patients brother, Chris George called.  Chris George reported that his brother has been in cardiology intensive care at [facility] for two weeks. Reports they are removing fluids due his heart condition.  Chris George reports that his brother has multiple doctors and he is not clear regarding his brothers status.  Chris George is asking for Dr Jama to call him after reviewing his brothers chart to provide some clarification regarding his brothers current condition and treatment plan.  Chris George can be reached at [phone].

## 2024-09-17 ENCOUNTER — Encounter

## 2024-09-17 NOTE — Nursing Note (Signed)
 OCCUPATIONAL THERAPY  Treatment Note: 1416, 09/17/2024  Patient Name: Chris George  Medical Record Number: [id number]  Date of Birth: [birth year] Sex: Male   Post-Discharge Occupational Therapy Recommendations: 5x weekly, High intensity       Equipment Recommendation OT DME Recommendations: Defer to post acute, Tub Transfer Bench  OT Treatment Diagnosis: Generalized muscle weakness, Limitation of activities due to disability, Unsteadiness on feet, Need for assistance with personal care, Reduced mobility    ASSESSMENT Problem List: Decreased strength, Decreased activity tolerance, Increased edema, Decreased mobility, Fall risk, Impaired ADLs, Impaired balance, Decreased safety awareness, Decreased cognition    Assessment: Pt completed STS from low EOB, ambulated in hallway, and sat in chair at end of session for lunch.  Pt has made progress towards short term OT goals but continues to be impacted by decreased functional endurance during today's session, requiring 2 standing rest breaks during functional mobility. He would continue to benefit from skilled acute OT and demonstrates great motivation to make progress towards goals set.   Session Summary:   Skilled Interventions Performed: Pt deferred ADL tasks at this time, agreeable to ambulation in hallway.  Pt walked ~336ft c SPC, initially requiring CGA from therapist for safety that quickly progressed to SBA from therapist.  Pt self-initiated 2 standing rest breaks (every 3-38min) during ambulation, demonstrating good awareness of exertion levels, Encouragement was provided to sit in recliner for remainder of meal, and therapist educated pt on the importance of OT seeing him perform ADLs to ensure he possesses the required balance and activity tolerance for ADLs/IADLs.     Activity Tolerance During Today's Session Tolerated treatment well  PLAN  Planned Frequency of Treatment: Plan of Care Initiated: 09/14/24 1-2x per day Weekly  Frequency: 3-4 days per week Planned Treatment Duration: 09/28/24  Planned Interventions:  Cognitive Skills Development, Self-Care/Home Training, Education (Patient/Family/Caregiver), Therapeutic Exercise, Therapeutic Activity, Home Exercise Program    Prognosis Prognosis: Good Positive Indicators: Motivation Barriers to Discharge: Inaccessible home environment, Endurance deficits, Gait instability, Impaired Balance, Inability to safely perform ADLS  SUBJECTIVE Communication Preference: Verbal,     Patient / Caregiver reports: I just want to build myself back up before I start chemo again Equipment / Environment: Vascular access (PIV, TLC, Port-a-cath, PICC), Telemetry  OBJECTIVE Cognition Cognition: WFL Comments: difficulty with word-finding Orientation: Oriented x4 Safety Judgment: Good awareness of safety precautions  ADLs Feeding : Set Up Assist, Performed seated  Functional Mobility Transfers: Standby assist  Ambulation Level of Assistance: Contact guard assist, steadying assist, Standby assist, set-up cues, supervision of patient - no hands on (CGA initially, quickly progressing to SBA during ambulation) Assistive Device: Straight Cane  Long Term Goal #1: Will score 24/24 on AMPAC in 5 weeks    Precautions: Falls precautions  Weight Bearing Status: Non-applicable   Required Braces or Orthoses: Non-applicable  Vitals/Orthostatics: Vitals stable throughout per tele  Family/Caregiver present: None Pain Comments: no c/o pain  Patient at end of session: All needs in reach, Alarm activated, In chair, Lines intact, Notified Nurse  Occupational Therapy Session Duration OT Individual [mins]: 27     Goals: Patient and Family Goals: None stated on evaluation  SHORT GOAL #1: Will complete full body dressing + item retreival with mod-I  Date Established : 09/14/24  Time Frame : 2 weeks  Plan : In Progress   SHORT GOAL #2: Will complete toilet transfer +  toielting tasks with Mod-I  Date Established : 09/14/24  Time Frame : 2 weeks  Plan : In Progress  SHORT GOAL #3: Will complete >10 minutes of standing IADLs with supervision, with RPE <3/10  Date Established : 09/14/24  Time Frame : 2 weeks   Plan : In Progress  SHORT GOAL #4: Will complete assessment of medication management skills  Date Established : 09/14/24  Time Frame : 2 weeks  Plan : In Progress  Prior function and Living Environment Prior Functional Status: PTA, pt was independent for ADLs, IADLs and mobility (no device). Pt reports driving and doing his own grocery shopping. Reports he goes to his mother's house every Sunday for dinner (eats with hi mom and brother). Has been doing ongoing speech therapy.  Living Situation Living Environment: Apartment Lives With: Alone Home Living: One level home, Stairs to enter with rails, Tub/shower unit, Standard height toilet Rail placement (outside): Bilateral rails in reach Number of Stairs to Enter (outside): 22  Equipment available at home: Straight cane, Rolling walker  AM-PAC Daily Activity Assessment Lower Body Dressing assistance needs: A Little - Minimal/Contact Guard Assist/Supervision Bathing assistance needs: A Little - Minimal/Contact Guard Assist/Supervision Toileting assistance needs: A Little - Minimal/Contact Guard Assist/Supervision Upper Body Dressing assistance needs: A Little - Minimal/Contact Guard Assist/Supervision Personal Grooming assistance needs: A Little - Minimal/Contact Guard Assist/Supervision Eating Meals assistance needs: None - Modified Independent/Independent   Daily Activity Score: 19  Score (in points): % of Functional Impairment, Limitation, Restriction 5: 100% impaired, limited, restricted 6-7: At least 80%, but less than 100% impaired, limited restricted 8-11: At least 60%, but less than 80% impaired, limited restricted 12-16: At least 40%, but less than 60% impaired, limited  restricted 17-18: At least 20%, but less than 40% impaired, limited restricted 19: At least 1%, but less than 20% impaired, limited restricted 20: 0% impaired, limited restricted  'AM-PAC' forms are Copyright protected by The Trustees of [facility]    I attest that I have reviewed the above information. Signed: Lavern Fuse, COTA Filed 09/17/2024

## 2024-09-17 NOTE — Nursing Note (Signed)
 Physical Therapy Treatment Note (09/17/24 1039)  Patient Name:  Chris George      Medical Record Number: [id number]  Date of Birth: [birth year] Sex: Male    Post-Discharge Physical Therapy Recommendations: PT Post Acute Discharge Recommendations: 5x weekly, High intensity     Barriers to Discharge: Inaccessible home environment, Endurance deficits, Gait instability, Impaired Balance, Inability to safely perform ADLS Equipment Recommendation PT DME Recommendations: Defer to post acute       Treatment Diagnosis: Abnormalities of gait and mobility    Assessment/Session Summary Problem List: Increased edema, Gait deviation, Impaired ADLs, Fall risk, Decreased mobility, Decreased endurance, Impaired balance  Assessment : Chris George remains highly engaged in his rehabilitation with a good understanding of importance of maximizing function prior to surgery. Pt tolerated introduction of single point cane for ambulation well, but does require contact guard assist for stability. He remains greatly deconditioned, with changes in heart rhythm noted on monitor after ambulation and with initiation of step-ups for stair training, unable to perform more than 2 step ups at a time for safety this date. He has 2 flights of stairs to enter his home. He continues to benefit from skilled acute PT and 5xH post-acute PT recommendation to maximize independence and to navigate home environment.  Activity Tolerance: Tolerated treatment well  Bed Mobility Bed Mobility: Supine to Sit Supine to Sit assistance level: Modified independent, requires aide device or extra time  Transfers Transfers: Sit to Stand Sit to Stand assistance level: Standby assist, set-up cues, supervision of patient - no hands on  Ambulation Level of Assistance: Contact guard assist, steadying assist Assistive Device: Straight Cane Distance Ambulated (ft): 400 ft    Stairs: Pt performed step ups onto 6 in step with L UE support on  bedrail. 2 sets of x1 step up on RLE, x1 step up on LLE with Min A for support. Pt requires seated rest break between sets due to ventricular trigeminy noted on monitor.   PT Individual [mins]: 39   Additional interventions: Pt performs STS from EOB x1 with no AD, x1 with SPC, CGA both trials. Pt ambulated 400 ft around unit with SPC, CGA, initiated education re: 2 point gait pattern, and pt able to make fair adjustments to gait pattern. Pt with decreased gait speed, decreased reciprocal arm swing. RPE = 3/10, pt takes 2-3 brief standing rest breaks during ambulation. STS x2 from recliner chair with CGA + no AD. Pt education provided re: PT role, POC, importance of maximizing strength + function of BLEs for balance, endurance, and function prior to surgery.     Plan Planned Frequency of Treatment: Plan of Care Initiated: 09/14/24 1-2x per day Weekly Frequency: 4-5 days per week Planned Treatment Duration: 09/28/24  Planned Interventions: Education (Patient/Family/Caregiver), Self-care / Home Management training, Therapeutic Exercise, Therapeutic Activity, Gait training, Home exercise program    Subjective Medical Updates Since Last Visit/Relevant PMH Affecting Clinical Decision Making: transferred to ICCU Equipment / Environment: Vascular access (PIV, TLC, Port-a-cath, PICC), Telemetry Communication Preference: Verbal   Patient reports: Agreeable to PT session Pain Comments: pt denies pain at rest, endorses R knee stiffness upon initial ambulation, activity adjusted per pt tolerance   Objective Goals Patient and Family Goals: get better, go home  SHORT GOAL #1: Patient will complete all functional transfers independently.              Date Established : 09/14/24             Time Frame :  2 weeks                        Plan : In Progress   SHORT GOAL #2: Patient will ambulate household distances independently.             Date Established : 09/07/24              Time Frame : 2 weeks                          Plan : In Progress  SHORT GOAL #3: Patient will navigate stairs necessary to navigate home independently.             Date Established : 09/14/24             Time Frame : 2 weeks                         Plan : In Progress  Long Term Goal #1: Patient will ambulate community distances independently. Time Frame: 4 weeks  Cognition Cognition: WFL Orientation:  (not formally assessed)  Precautions / Restrictions Precautions: Falls precautions Weight Bearing Status: Non-applicable Required Braces or Orthoses: Non-applicable              Vitals/Orthostatics : Sitting EOB at start: HR 58-64bpm, SpO2 92% on RA, BP 86/46(58). Standing: 96/47(59). After ambulation: 131/72(95). During ambulation, pt noted to have increased PVCs and intermittent ventricular trigeminy noted on monitor, requiring seated rest for recovery, RN notified. Pt fully asymptomatic throughout.  Patient at end of session: All needs in reach, Friends/Family present, In bed, Lines intact, Notified Nurse, Staff present  Physical Therapy Session Duration PT Individual [mins]: 39  Prior function and Living Environment: Prior Functional Status: pt reports he is currently using no AD and has had no falls. He reports he learned his lesson not to climb the steps with numerous bags of groceries.  Living Situation Living Environment: Apartment Lives With: Alone Home Living: One level home, Stairs to enter with rails, Tub/shower unit, Standard height toilet Rail placement (outside): Bilateral rails in reach Number of Stairs to Enter (outside): 22 Caregiver Identified?: Yes (identifies his mother) Caregiver Availability: 24 hours Caregiver Ability: Supervision Equipment available at home: Straight cane, Rolling walker    AM-PAC-6 click Help currently need turning over In bed?: None - Modified Independent/Independent Help currently needed sitting down/standing up from chair with arms? : A Little -  Minimal/Contact Guard Assist/Supervision Help currently needed moving from supine to sitting on edge of bed?: None - Modified Independent/Independent Help currently needed moving to and from bed from wheelchair?: A Little - Minimal/Contact Guard Assist/Supervision Help currently needed walking in a hospital room?: A Little - Minimal/Contact Guard Assist/Supervision Help currently needed climbing 3-5 steps with railing?: A Little - Minimal/Contact Guard Assist/Supervision  Basic Mobility Score 6 click: 20  6 click Score (in points): % of Functional Impairment, Limitation, Restriction 6: 100% impaired, limited, restricted 7-8: At least 80%, but less than 100% impaired, limited restricted 9-13: At least 60%, but less than 80% impaired, limited restricted 14-19: At least 40%, but less than 60% impaired, limited restricted 20-22: At least 20%, but less than 40% impaired, limited restricted 23: At least 1%, but less than 20% impaired, limited restricted 24: 0% impaired, limited restricted  'AM-PAC' forms are Copyright protected by [employer] of [facility]    I attest that I have reviewed  the above information. SignedBETHA Powell JAYSON Shayne, PT 09/17/2024

## 2024-09-17 NOTE — Care Plan (Signed)
 Shift Summary Ambulation distance increased to 400 ft with progression from contact guard assist to standby assist, and patient tolerated therapy well. midodrine was administered twice during the shift to support blood pressure. bumetanide  and magnesium  sulfate were administered late morning. No falls or injuries occurred, and safety awareness was consistently good. Patient remained engaged in rehabilitation and expressed motivation to improve strength before chemotherapy, with overall status stable and no new hospital-acquired conditions documented.  Absence of Hospital-Acquired Illness or Injury: No new hospital-acquired conditions were documented during the shift, and sepsis risk scores remained unchanged and low throughout the day. Skin assessments continued to note a rash to the left lower extremity and scattered bruising, but no new findings were reported.  Readiness for Transition of Care: Functional independence in transfers and ambulation improved, with progression from contact guard assist to standby assist during ambulation and continued engagement in rehabilitation activities. Activity tolerance was consistently good, and the patient expressed motivation to build strength before starting chemotherapy again.  Absence of Fall and Fall-Related Injury: No falls or injuries were reported, and safety awareness remained good with appropriate use of assistive devices and supervision during mobility tasks.  Improved Ability to Complete Activities of Daily Living: Assistance needs for dressing, bathing, and toileting were minimal, and independence in eating was maintained. The patient tolerated therapy well and demonstrated progress in mobility and transfers.  Skin Health and Integrity: Skin color remained appropriate for ethnicity, and skin protection measures such as absorbent pads and pressure-redistributing mattress were utilized. Bruising and rash to the left lower extremity persisted without  worsening, and no pressure injuries were documented.

## 2024-09-17 NOTE — Consults (Signed)
 Acute Inpatient Rehab referral has been received and patient is currently under review.    Please be advised, [facility] is now located at the [facility].   Visitation Policy: [facility] allows visitors (6 AM- 9 PM) and one visitor may be designated to stay overnight. The overnight visitor must be in the facility prior to 9 PM.    Selinda Schmitz MS, PT [facility] Inpatient Coordinator  Office [phone]  10:51 AM 09/17/2024    Admission Criteria for Acute Inpatient Rehab:   The patient has medical need that will require daily physician oversight. The patient requires at least two therapy disciplines (PT, OT, ST) at a high frequency. It is anticipated that the patient will make significant functional improvement in a reasonable length of time.  The patient is able to participate in and tolerate a minimum of three hours of therapy per day.  The patient or his/her representative consents to the admission. The patient/family has realistic goals that include discharge to a community setting (other than SNF) and has adequate assistance at home.

## 2024-09-17 NOTE — Care Plan (Signed)
 [facility] referral sent to [facility]. Chris George 09/17/2024 10:09 AM

## 2024-09-17 NOTE — Discharge Summary (Signed)
 ------------------------------------------------------------------------------- Attestation signed by Freddy Blinks, MD at 09/22/24 1114 I saw and evaluated the patient, participating in the key portions of the service on the day of discharge. I reviewed the resident's note and agree with the discharge plans and disposition. I personally spent over 30 minutes in discharge planning services.  -------------------------------------------------------------------------------    Cardiology Discharge Summary  Identifying Information: Chris George 25-Jul-1956 [id number]   Admit Date: 09/02/2024 Discharge Date: 09/22/2024  Discharge To: Home with PT/OT Services Discharge Service: Cardiology [facility]) Discharge Attending Physician: Blinks Freddy, MD Discharge Cardiology Fellow: Dearl Dubois, MD Primary Care Physician: [facility]   Discharge Diagnoses:  Principal Problem:   Right ventricular failure    (CMS-HCC) Active Problems:   Permanent atrial fibrillation    (CMS-HCC)   Type 2 diabetes mellitus with stage 3a chronic kidney disease, without long-term current use of insulin  (CMS-HCC)   Major depressive disorder, single episode, mild   Acute on chronic heart failure with preserved ejection fraction (HFpEF) (CMS-HCC)   History of CVA (cerebrovascular accident)   Hyperlipidemia   Severe tricuspid regurgitation   Chronic kidney disease   Hypotension   Severe mitral regurgitation   Principal Diagnosis: Principal Diagnosis: HFmrEF  Secondary Diagnoses: Permanent atrial fibrillation    (CMS-HCC)   Type 2 diabetes mellitus with stage 3a chronic kidney disease, without long-term current use of insulin  (CMS-HCC)   Major depressive disorder, single episode, mild   Acute on chronic heart failure with preserved ejection fraction (HFpEF) (CMS-HCC)   History of CVA (cerebrovascular accident)   Hyperlipidemia   Severe tricuspid regurgitation   Chronic kidney disease    Hypotension   Severe mitral regurgitation    Outpatient Provider Follow Up Issues: [ ]  urology follow up - increased urinary frequency/history of prostate cancer [ ]  PCP - plan for outpatient genetic testing   [ ]  valve clinic - severe TR and MR [ ]  EP clinic - pacemaker evaluation  Hospital Course: Chris George is a 68 y.o. 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  HFmrEF dx 2018, now fully recovered  Hypotension  Home regimen includes: Spironolactone 25mg  daily, jardiance 10mg  daily and PO bumex  3mg  daily (patient unsure if this dosing is correct). Presented to clinica appointment with 30 lbs weight gain (175-->205), bilateral lower extremity edema, orthopnea, severe dyspnea with exertion following recent hospitalization for C diff infection where his diuretics were paused. Secondary to severe valve disease as above. Admitted to [facility] and started on dobutamine assisted diuresis on 10/31 with IV lasix  and metolazone. Patient has been responsive to IV diuretic regimen with little impact on his pre-existing reduced renal function. However, hypotension has limited diuresis. Ultimately transferred to CICU 11/8 for further management. Continuing inotrope assisted diuresis. 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  TTE 09/03/2024: EF 55%, LV upper normal in size w/ mild increased wall thickness; RV severely dilated w/ mod reduced systolic function (worsened compared to 06/15/24); severe centrally directed MR; severe TR  TTE 09/11/24: EF >55%, severe MR, severe TR. GDMT initially held iso AKI and hypotension. Diuresis  initiated with bumex  1mg  every day. Midodrine 15mg  TID for blood pressure support. 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 Cardiac MRI: severely dilated RV, moderate to severe mitral regurgitation, enlarged pulmonary artery, No concern for infiltrative or amyloid disease. Consulted PM&R and considered for AIR, however insurance denied. Appropriate for discharge home with PT/OT services.     Severe TR  Severe MR  TTE on 09/03/24 with severe TR, severe centrally directed MR, and moderately reduced RV systolic fx, likely contributing to volume overload. The structural heart team recommended transfer from [facility] to [facility] for further evaluation. Oncology consulted for overall prognosis and feel patient should be considered for cardiac interventions to improve his current comorbidities and his primary oncologist will assess his disease status in January. Structural cardiology consulted and initiated workup for valvular intervention. TRISCEND protocol cardiac CT showing . TEE demonstrated severe tricuspid regurgitation with a restricted and short septal leaflet in addition to severe centrally directed mitral regurgitation. Despite dobutamine-assisted diuresis, valvular regurgitation appears largely unchanged.  Cardiac CT TAVR 09/10/24: Severe biatrial enlargement; dilated right ventricle with low normal systolic function; TV leaflets are not well visualized but appear with normal thickness and mobility; MV leaflets appear normal in thickness with normal mobility and no notable mitral annular calcification. Structural heart team consulted, appears no plan for inpatient intervention at this time     AKI on CKD IV Baseline creatinine appears to be 2.3-2.6 (eGFR 25-29). Creatinine was 2.35 on admission 10/29 --> 2.82 on 11/3 with diuresis --> steady at 2.6-2.7 for several days --> now steadily increasing, 3.08 on 11/11. Likely congestive  nephropathy with improvement to 2.68 at discharge.   Atrial Fibrillation  Patient has undergone AFib ablation in the past per record. previously in rate controlled atrial fibrillation on telemetry, now in junctional bradycardia. Continued anticoagulation with home Eliquis  5 BID.    Anemia Neutropenia Thrombocytopenia Patient has chronic anemia, neutropenia and thrombocytopenia all of which are followed by outpatient Oncologist. WBC 1.7 with ANC 0.7, Hgb 7.9, and Plt 96 on presentation to [facility] on 10/29. These blood counts have largely been stable, although WBC has risen in response to prednisone  for gout flare. 1u pRBC transfused for hgb 6.5 on 11/4. 2uPRBCs transfused for hgb 6.8 on 11/17.     Chronic Problems   Metastatic Colon Cancer Prostate Cancer Chronic smoldering Myeloma Follows with [facility]. Patient has recurrent metastatic colon CA, treated recently with dose reduced FOLFIRI, with last PET on 08/25/2024 showing overall improvement, but still residual malignancy. When he last saw oncology on 09/01/24, they had planned for a chemotherapy holiday. He also has localized prostate cancer and completed XRT in 2024. He also has smoldering myeloma, initially diagnosed in 2013 and treated in [place], [place] with single agent Velcade. He declined maintenance treatment or referral for bone marrow transplant. Currently being monitored by oncologist. Most recent bone marrow bx in 2020 showed only 10-15% plasma cells.  - Per oncology, metastatic colon cancer is stable and patient should be considered for valvular intervention if he is a candidate    Peripheral neuropathy - Continue home gabapentin  200 mg BID   Constipation - Continue home Metamucil BID    DM2  HgbA1c up to 9.8 on 12/17/22. HgbA1c on admission 5.5. Not on any home meds, appears to be well controlled with lifestyle modifications - Monitor blood glucose on BMP   Gout Flare (resolved) Had been started on  prednisone  taper and colchicine  earlier in hospitalization, but discontinued as patient no longer symptomatic. Continue allopurinol  50 mg every other day (renal dosing). 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  - Continue home atorvastatin  80mg  daily      Procedures: No admission procedures for hospital encounter.  Discharge Day Services: BP 90/44   Pulse (!) 49   Temp 36.6 C (97.9 F) (Oral)   Resp 10   Ht 175.3 cm (5' 9)   Wt 84.9 kg (187 lb 1.6 oz)   SpO2 98%   BMI 27.63 kg/m  Pt seen on the day of discharge and determined appropriate for discharge.  Cardiac Rehab Referral: Cardiac rehab was discussed with the patient: no Referral was placed: no. If no, document reason: not indicated. Indication: N/A  Condition at Discharge: good  Discharge Medications:   Your Medication List     START taking these medications    allopurinol  100 MG tablet Commonly known as: ZYLOPRIM  Take 0.5 tablets (50 mg total) by mouth daily.   midodrine 5 MG tablet Commonly known as: PROAMATINE Take 3 tablets (15 mg total) by mouth Three (3) times a day.       CHANGE how you take these medications    bumetanide  1 MG tablet Commonly known as: BUMEX  Take 1 tablet (1 mg total) by mouth daily. May take an additional 1 mg (1 tablet) in the afternoon if you experience a 3 pound weight gain in 1 day, 5 pound weight gain in 1 week, and/or swelling in your legs, ankles, or abdomen. What changed:  medication strength how much to take additional instructions       CONTINUE taking these medications    atorvastatin  80 MG tablet Commonly known as: LIPITOR Take 1 tablet by mouth once daily   calcitriol  0.25 MCG capsule Commonly known as: ROCALTROL  Take 1 capsule (0.25 mcg total) by mouth daily.   CALCIUM -MAGNESIUM -ZINC ORAL Take 1 tablet by mouth in the morning.    colchicine  0.6 mg tablet Commonly known as: COLCRYS  Take 1 tablet (0.6 mg total) by mouth daily as needed (gout flare).   ELIQUIS  5 mg Tab Generic drug: apixaban  Take 1 tablet by mouth twice daily   FLOMAX  0.4 mg capsule Generic drug: tamsulosin  Take 1 capsule (0.4 mg total) by mouth daily.   gabapentin  100 MG capsule Commonly known as: NEURONTIN  Take 2 capsules (200 mg total) by mouth two (2) times a day.   lidocaine -prilocaine  2.5-2.5 % cream Commonly known as: EMLA  Apply topically as needed.   MULTIVITAMIN ORAL Take 1 tablet by mouth daily.   psyllium powder Commonly known as: METAMUCIL Take 1 packet by mouth daily.   sodium bicarbonate 650 mg tablet Take 1 tablet (650 mg total) by mouth two (2) times a day.        Recent Labs: Microbiology Results (last day)     ** No results found for the last 24 hours. **      Lab Results  Component Value Date   WBC 5.4 09/22/2024   HGB 7.6 (L) 09/22/2024   HCT 22.2 (L) 09/22/2024   PLT 109 (L) 09/22/2024   Lab Results  Component Value Date   NA 138 09/22/2024   K 4.0 09/22/2024   CL 101 09/22/2024   CO2 22.0 09/22/2024   BUN 86 (H) 09/22/2024   CREATININE 2.68 (H) 09/22/2024   CALCIUM  8.7 09/22/2024   MG 1.8 09/22/2024   PHOS 5.2 (H) 09/14/2024   Lab Results  Component Value Date   ALKPHOS 383 (H)  09/22/2024   BILITOT 0.5 09/22/2024   BILIDIR 0.20 09/22/2024   PROT 6.8 09/22/2024   ALBUMIN 2.9 (L) 09/22/2024   ALT 48 09/22/2024   AST 45 (H) 09/22/2024   Lab Results  Component Value Date   PT 20.5 (H) 11/19/2023   INR 1.80 11/19/2023   APTT 27.1 04/05/2023   Radiology: ECG 12 Lead Result Date: 09/19/2024 PROBABLE ATRIAL FIBRILLATION WITH SLOW VENTRICULAR RESPONSE RIGHT BUNDLE BRANCH BLOCK WHEN COMPARED WITH ECG OF 13-Sep-2024 10:10, NO SIGNIFICANT CHANGE WAS FOUND Confirmed by Vinie Dunnings ([id number]) on 09/19/2024 11:41:15 AM  PVL Venous Duplex Lower Extremity Bilateral Result Date: 09/19/2024   Peripheral Vascular Lab     [address]   [place], [place] [address]  PVL VENOUS DUPLEX LOWER EXTREMITY BILATERAL Patient Demographics Pt. Name: UMAR PATMON Location: PVL Inpatient Bedside MRN:      [id number]       Sex:      M DOB:      1955/12/13       Age:      37 years  Study Information Authorizing Provider [id number] NICOLE      Performed Time       09/18/2024 7:23:40 Name                 [facility]                                   PM Ordering Physician   Nat Pour       Patient Location     [facility] Accession Number     [id number] UN     Technologist         Aleene Groom RVT Diagnosis:                              Assisting                                         Technologist Ordered Reason For Exam: rule out DVT, eval for AIR Indication: Risk Factors: Immobility and (h/o A-fib.). Anticoagulation: (Eliquis ). Protocol The major deep veins from the inguinal ligament to the ankle are assessed for bilaterally for compressibility and color and spectral Doppler flow characteristics. The assessed veins include bilateral common femoral vein, femoral vein in the thigh, popliteal vein, and intramuscular calf veins. The iliac vein is assessed indirectly using Doppler waveform analysis. The great saphenous vein is assessed for compressibility at the saphenofemoral junction, and the small saphenous vein assessed for compressibility behind the knee.  Final Interpretation Right There is no evidence of DVT in the lower extremity. There is no evidence of obstruction proximal to the inguinal ligament or in the common femoral vein. Left There is no evidence of DVT in the lower extremity. There is no evidence of obstruction proximal to the inguinal ligament or in the common femoral vein.  Electronically signed by [id number] Almeda Dolores MD on 09/19/2024 at 8:29:20 AM.  -------------------------------------------------------------------------------- Right Duplex Findings All veins visualized appear fully compressible. Doppler flow  signals demonstrate normal spontaneity, phasicity, and augmentation.  Left Duplex Findings All veins visualized appear fully compressible. Doppler flow signals demonstrate normal spontaneity, phasicity, and augmentation. Right Technical Summary No evidence of deep venous obstruction in the lower extremity. No indirect evidence of  obstruction proximal to the inguinal ligament. Left Technical Summary No evidence of deep venous obstruction in the lower extremity. No indirect evidence of obstruction proximal to the inguinal ligament. Final   MRI Cardiac Morph W Wo Contrast Result Date: 09/16/2024 EXAM: Cardiac MRI for morphology and function with and without contrast. DATE: 09/15/2024 5:04 PM ACCESSION: [id number] UN DICTATED: 09/16/2024 12:58 AM INTERPRETATION LOCATION: MAIN CAMPUS CLINICAL INDICATION: 68 years old Male with assess for infiltrative disease  COMPARISON: None TECHNIQUE: Cardiac MRI was performed without and with 8.02 mL Gadavist contrast. Post-processing performed using [id number] software. FINDINGS: CHAMBERS: Severe dilation of the right ventricle (basal RVEDD= 6.6 cm with mild to moderate right ventricle hypertrophy (6 to 7 mm RV wall thickness). Moderate dilation of the left ventricle with LVEDD= 6.9 cm. Normal wall thickness of the left ventricle (6 to 7 mm). Severe dilation of the right atrium. Less severe dilation of the left atrium. Max LA volume = 240 mL (46 sq cm, 93 mL/sq m) Max RA volume = 414 mL (70 sq cm, 210 mL/sq m) MOTION: Dyssynchrony of right ventricle and left ventricle systole with LV end systole proceeding RV end systole compatible with right bundle branch block. Systolic septal flattening compatible with elevated right ventricle pressure. LVEDV is 307 mL. Index LVEDV is 156 mL/m2. LVESV is 136 mL. Index LVESV is 69 mL/m2. LVSV is 171 mL. LVEF is 56%. RVEDV is 473 mL. Index RVEDV is 239 mL/m2. RVESV is 232 mL. Index RVESV is 118 mL/m2. RVSV is 241 mL. RVEF is 51% PERICARDIUM:  Trace/trivial pericardial effusion. No pericardial thickening or enhancement. VALVES: Severe tricuspid valve regurgitation. Moderate to severe mitral valve regurgitation. No tricuspid or mitral valve stenosis. No aortic valve stenosis or regurgitation. Trace pulmonic valve regurgitation. No pulmonic valve stenosis. PERFUSION: First-pass perfusion imaging demonstrated no regions of hypoenhancement to suggest ischemia.  DELAYED IMAGES: Delayed post-contrast images demonstrated subepicardial to mid wall enhancement in the basal to mid inferior and inferolateral wall. RV insertion site enhancement at the basal anterior and basal to mid inferior RV insertion site. VESSELS: Pulmonary artery is enlarged measuring 4.0 cm in diameter. Thoracic aorta is normal in size measuring 3.2 cm. THORAX: Visualized thorax appears normal. ABDOMEN: Visualized abdomen appears normal.   1.  Severely dilated right ventricle (index RVEDV = 239 mL/sq m) and right atrium with severe tricuspid valve regurgitation, mild RV hypertrophy, systolic septal flattening and RV insertion site enhancement/fibrosis. Findings compatible with chronic tricuspid valve regurgitation and pulmonary hypertension. Adaptive preserved RV systolic function (RVEF = 51%) with supranormal stroke volume (RVSV = 241 mL ). 2.  Less severe dilation of the left ventricle with moderate to severe mitral regurgitation, adaptive normal systolic function (LVEF = 56%) with supranormal stroke volume (LVSV= 171 mL) and subepicardial enhancement/fibrosis in the mid inferior and inferolateral wall. Favor sequela of prior myocarditis. 3.  Enlarged pulmonary artery measuring 4.0 cm, compatible with pulmonary hypertension. 4.  No evidence of infiltrative disease such as amyloidosis.   Echocardiogram Transesophageal Echo Result Date: 09/14/2024 Patient Info Name:     KASPER MUDRICK Age:     68 years DOB:     [birth year] Gender:     Male MRN:     [id number] Accession #:      [id number] UN Account #:     [id number] Ht:     175 cm Wt:     87 kg BSA:     2.08 m2 BP:     91 /     48 mmHg  Exam Date:     09/11/2024 2:47 PM Admit Date:     09/02/2024 Exam Type:     ECHOCARDIOGRAM TRANSESOPHAGEAL ECHO Technical Quality:     Fair Staff Sonographer:     Arley JONELLE Jeans Ordering Physician:     Paticia [facility] Blanch Study Info Indications      - evaluate MR for clip candidacy and TR with tricsend/evoque protocol Procedure(s)   Complete two-dimensional, color flow and Doppler transesophageal study is performed. Three dimensional echocardiographic imaging is performed during the transesophageal echocardiogram. Summary   1. The left ventricular systolic function is normal, LVEF is visually estimated at > 55%.   2. There is severe centrally directed mitral regurgitation.   3. MR Effective regurgitant orifice and volume by PISA are  0.59 cm2 and  79 ml respectively.   4. The right ventricle is mildly dilated in size, with moderately reduced systolic function.   5. There is severe tricuspid regurgitation.   6. There is moderate pulmonary hypertension.   7. TR maximum velocity: 3.6 m/s  Estimated PASP: 60 mmHg. Anesthesia/Analgesia   The patient received general anesthesia. Sedation was performed by anesthesiologist. See anesthesia report. Procedure Details   The patient arrived in a fasting state. The procedure was explained and the consent form was signed. The patient tolerated the procedure well and there were no complications. There was no difficulty inserting the probe. The procedure was completed without complications. Number of insertion attempts: 1. The probe was inserted by the cardiologist. Left Ventricle   The left ventricular systolic function is normal, LVEF is visually estimated at > 55%. Right Ventricle   The right ventricle is mildly dilated in size, with moderately reduced systolic function. Left Atrium   The left atrium is dilated in size. There is no thrombus seen in the left atrium.  No evidence for left atrial appendage thrombus. Right Atrium   The right atrium is dilated in size. Atrial Septum   Intact interatrial septum visualized by 2D and color flow imaging. Aortic Valve   The aortic valve is trileaflet with normal appearing leaflets with normal excursion. There is trivial aortic regurgitation. Pulmonic Valve   The pulmonic valve is normal. There is mild pulmonic regurgitation. Mitral Valve   The mitral valve leaflets are normal with normal leaflet mobility. MR Effective regurgitant orifice and volume by PISA are  0.59 cm2 and  79 ml respectively. There is severe centrally directed mitral regurgitation. Systolic blunting noted in the pulmonary veins. Systolic (blunting/reversal) of the left upper pulmonary vein(s) is present. Tricuspid Valve   The tricuspid valve leaflets are normal, with restricted septal leaflet. There is severe tricuspid regurgitation. There is moderate pulmonary hypertension. TR maximum velocity: 3.6 m/s  Estimated PASP: 60 mmHg. Pericardium/Pleural   There is no pericardial effusion. Mitral Valve ---------------------------------------------------------------------- Name                                 Value        Normal ---------------------------------------------------------------------- MV Doppler ---------------------------------------------------------------------- MV Peak Velocity                   1.1 m/s               MV Mean Gradient                    2 mmHg  MV Regurgitation Doppler ---------------------------------------------------------------------- MR Peak Velocity                   4.3 m/s               MR Peak Gradient                   73 mmHg               MR VTI                              134 cm               MR PISA Radius                      1.0 cm               MR PISA Alias Velocity             40 cm/s               MR ERO (PISA)                     0.59 cm2               MR Volume (PISA)                     79 ml Tricuspid  Valve ---------------------------------------------------------------------- Name                                 Value        Normal ---------------------------------------------------------------------- TV Regurgitation Doppler ---------------------------------------------------------------------- TR Peak Velocity                   3.6 m/s               Estimated PAP/RSVP ---------------------------------------------------------------------- RA Pressure                         8 mmHg           <=5 RV Systolic Pressure               60 mmHg           <36 Report Signatures Finalized by Odell Debby Grew  MD on 09/14/2024 03:10 PM Preliminary amended by Beverely Coward  MD on 09/11/2024 05:21 PM Resident Beverely Coward  MD on 09/11/2024 05:17 PM  ECG 12 Lead Result Date: 09/13/2024 ATRIAL FIBRILLATION WITH SLOW VENTRICULAR RESPONSE RIGHT BUNDLE BRANCH BLOCK ABNORMAL ECG WHEN COMPARED WITH ECG OF 12-Sep-2024 12:32, NO SIGNIFICANT CHANGE WAS FOUND Confirmed by Cleotilde Moccasin ([id number]) on 09/13/2024 1:22:48 PM  ECG 12 Lead Result Date: 09/13/2024 ATRIAL FIBRILLATION WITH SLOW VENTRICULAR RESPONSE RIGHT BUNDLE BRANCH BLOCK NON-SPECIFIC ST/T WAVE CHANGES ABNORMAL ECG WHEN COMPARED WITH ECG OF 12-Sep-2024 12:32, NO SIGNIFICANT CHANGE WAS FOUND Confirmed by Cleotilde Moccasin ([id number]) on 09/13/2024 11:21:47 AM  XR Chest Portable Result Date: 09/12/2024 EXAM: XR CHEST PORTABLE ACCESSION: [id number] UN REPORT DATE: 09/12/2024 10:29 PM CLINICAL INDICATION: LINE CHECK (CATHETER VASCULAR FIT)  TECHNIQUE: Single View AP Chest Radiograph. COMPARISON: 09/12/2024 FINDINGS: Interval repositioning of right internal jugular central venous catheter terminating in the right atrium. Unchanged left subclavian port catheter terminating in the superior cavoatrial junction.  Lungs are low in volume with bronchovascular crowding. No focal consolidation. No pleural effusion or pneumothorax. Stable enlarged cardiac silhouette.   Interval  repositioning of right internal jugular central venous catheter terminating in the right atrium.   XR Chest Portable Result Date: 09/12/2024 EXAM: XR CHEST PORTABLE ACCESSION: [id number] UN REPORT DATE: 09/12/2024 8:48 PM CLINICAL INDICATION: LINE CHECK (CATHETER VASCULAR FIT)  TECHNIQUE: Single View AP Chest Radiograph. COMPARISON: XR CHEST 2 VIEWS 09/02/2024 FINDINGS: Interval placement of right internal jugular central venous catheter terminating in superior vena cava. Unchanged left subclavian port catheter terminating in superior cavoatrial junction. Left basilar atelectasis. No pleural effusion or pneumothorax. Stable enlarged cardiac silhouette.   Interval placement of right internal jugular central venous catheter terminating in the superior vena cava.   ECG 12 Lead Result Date: 09/12/2024 ATRIAL FIBRILLATION WITH SLOW VENTRICULAR RESPONSE RIGHT BUNDLE BRANCH BLOCK WHEN COMPARED WITH ECG OF 09-Sep-2024 00:11, NO SIGNIFICANT CHANGE WAS FOUND Confirmed by Vinie Dunnings [phone]) on 09/12/2024 2:37:00 PM  CT Cardiac TAVR Result Date: 09/10/2024 EXAM: CT CARDIAC TRISCEND Name: ABDIRAHMAN CHITTUM Date: 09/10/2024 Fellow: Donna Attending: Margery Ruth, MD Impressions: - Tricuspid valve measurements as noted below - Severe biatrial enlargement - Dilated right ventricle with low normal systolic function Indication:  TRISCEND eval Contrast Used (ml): 63 DLP (mGy*cm): 1516 Patient on 2 of dobutamine at time of this study Findings: Tricuspid Valve:  The tricuspid valve leaflets are not well visualized but appear with normal thickness and mobility.  End systolic tricuspid annular dimensions:  (35%) Maximum and minimum dimensions: 77 mm x 58 mm Average annular diameter: 68 mm Annular area: 36 cm Annular perimeter: 229 mm  End-diastolic tricuspid annular dimensions: (90%) Maximum and minimum dimensions: 80 mm x 67 mm Average annular diameter: 74 mm Annular area: 43 cm Annular perimeter: 243 mm Aortic Valve: The  aortic valve is trileaflet and without calcification. Leaflets exhibit normal mobility. Mitral Valve: The mitral valve leaflets appear normal in thickness. There is normal mobility of the leaflets. There is no notable  mitral annular calcification. Cardiac Chambers: The left ventricle is normal in size with normal systolic function. The right ventricle is dilated in size with low normal systolic function. The right atrium is severely dilated The left atrium is severely dilated. Coronary Arteries: The study was not optimized for visualization of the coronary arteries. The coronary system is right dominant. There is mild coronary artery calcification noted. Pulmonary veins: One left (common trunk morphology) and two right pulmonary veins return normally to the left atrium. Great Vessels: The aorta is mildly dilated in size in the ascending segment - 4.1 cm x 3.9 cm The main pulmonary artery is mildly dilated Non-cardiac findings: No significant findings. Technique: The study was performed on a NAETOM ALPHA.  This study was acquired throughout the cardiac cycle with care kV and without dose modulation.  The study was retrospectively reconstructed with overlapping 0.5mm images .  The contrast was administered with a triphasic protocol of 70/30 contrast/saline.  Reconstructions were done from 0-100% at 10% increments. The 3D volume set was analyzed on a dedicated workstation [facility]) using MPR.   ECG 12 Lead Result Date: 09/10/2024 WIDE QRS RHYTHM WITH PVCS RIGHT BUNDLE BRANCH BLOCK ABNORMAL ECG WHEN COMPARED WITH ECG OF 08-Sep-2024 07:22, PVCS ARE NOW PRESENT Confirmed by Antonetta Gull ([id number]) on 09/10/2024 2:57:08 PM  ECG 12 Lead Result Date: 09/08/2024 JUNCTIONAL RHYTHM RIGHT BUNDLE BRANCH BLOCK ABNORMAL ECG WHEN COMPARED WITH ECG OF 07-Sep-2024 15:36, PREMATURE VENTRICULAR BEATS ARE NO  LONGER PRESENT Confirmed by Claudene Legions [phone]) on 09/08/2024 4:29:36 PM  ECG 12 Lead Result Date: 09/08/2024 WIDE  QRS RHYTHM WITH OCCASIONAL PREMATURE VENTRICULAR BEATS WITH VENTRICULAR ESCAPE COMPLEXES RIGHT BUNDLE BRANCH BLOCK LATERAL INFARCT  , AGE UNDETERMINED MARKED T WAVE ABNORMALITY, CONSIDER INFERIOR ISCHEMIA ABNORMAL ECG WHEN COMPARED WITH ECG OF 02-Sep-2024 12:30, WIDE QRS RHYTHM HAS REPLACED ATRIAL FIBRILLATION Confirmed by Claudene Legions ([id number]) on 09/08/2024 3:12:44 PM  Echocardiogram W Colorflow Spectral Doppler Result Date: 09/03/2024 Patient Info Name:     TREY GULBRANSON Age:     47 years DOB:     11-26-1955 Gender:     Male MRN:     [id number] Accession #:     [id number] UN Account #:     [id number] Ht:     175 cm Wt:     92 kg BSA:     2.14 m2 BP:     87 /     38 mmHg HR:     61 bpm Heart Rhythm:     Atrial Fibrillation Exam Date:     09/03/2024 2:54 PM Admit Date:     09/02/2024 Exam Type:     ECHOCARDIOGRAM W COLORFLOW SPECTRAL DOPPLER Technical Quality:     Fair Staff Sonographer:     Clotilda Haddock Reading Fellow:     Marsa Clara MD Ordering Physician:     Almarie Jenkins Levels Study Info Indications      - Re-evaluate heart function iso acute heart failure exacerbation Procedure(s)   Complete two-dimensional, color flow and Doppler transthoracic echocardiogram is performed. Summary   1. The left ventricle is upper normal in size with mildly increased wall thickness.   2. The left ventricular systolic function is normal, LVEF is visually estimated at 55%.   3. The right ventricle is severely dilated in size, with moderately reduced systolic function.   4. There is severe centrally directed mitral regurgitation.   5. There is severe tricuspid regurgitation.   6. The right atrium is severely dilated in size.   7. The aorta at the ascending aorta is moderately dilated.   8. IVC size and inspiratory change suggest elevated right atrial pressure. (10-20 mmHg).   9. Compared to study dated 06/15/24, RV function has worsened. Left Ventricle   The left ventricle is upper normal in size with mildly increased  wall thickness. The left ventricular systolic function is normal, LVEF is visually estimated at 55%. Left ventricular diastolic function cannot be accurately assessed. Right Ventricle   The right ventricle is severely dilated in size, with moderately reduced systolic function. Right ventricle free wall longitudinal strain is mildly decreased, -15.7 %. Ventricular Septum   Abnormal ventricular septal motion consistent with RV pressure and volume overload. Left Atrium   The left atrium is severely dilated in size. Right Atrium   The right atrium is severely dilated in size. Aortic Valve   The aortic valve is trileaflet with normal appearing leaflets with normal excursion. There is no significant aortic regurgitation. There is no evidence of a significant transvalvular gradient. Mitral Valve   The mitral valve leaflets are normal with normal leaflet mobility. There is severe centrally directed mitral regurgitation. There is no mitral stenosis. Tricuspid Valve   The tricuspid valve leaflets are normal, with normal leaflet mobility. There is severe tricuspid regurgitation. The pulmonary systolic pressure cannot be estimated due to severe TR. Pulmonic Valve   The pulmonic valve is poorly visualized, but probably normal. There is moderate pulmonic regurgitation. Aorta  The aorta at the ascending aorta is moderately dilated. [4.5 cm] in visualized segment. Inferior Vena Cava   IVC size and inspiratory change suggest elevated right atrial pressure. (10-20 mmHg). Pericardium/Pleural   There is no pericardial effusion. Hepatic Veins   Systolic flow reversal in the hepatic vein consistent with severe tricuspid regurgitation. Ventricles ---------------------------------------------------------------------- Name                                 Value        Normal ---------------------------------------------------------------------- LV Dimensions 2D/MM ----------------------------------------------------------------------  IVS  Diastolic Thickness (2D)                                1.4 cm       0.6-1.0 LVID Diastole (2D)                  5.8 cm       4.2-5.8  LVPW Diastolic Thickness (2D)                                1.3 cm       0.6-1.0 LVID Systole (2D)                   4.3 cm       2.5-4.0 LV Mass Index (2D Cubed)          162 g/m2        49-115  Relative Wall Thickness (2D)                                  0.43        <=0.42 RV Dimensions 2D/MM ----------------------------------------------------------------------  RV Basal Diastolic Dimension                           6.5 cm       2.5-4.1 TAPSE                               2.0 cm         >=1.7 Atria ---------------------------------------------------------------------- Name                                 Value        Normal ---------------------------------------------------------------------- LA Dimensions ---------------------------------------------------------------------- LA Dimension (2D)                   4.9 cm       3.0-4.1 LA Volume Index (4C A-L)        77.36 ml/m2               LA Volume Index (2C A-L)        81.93 ml/m2               LA Volume (BP MOD)                  164 ml               LA Volume Index (BP MOD)        76.74 ml/m2   16.00-34.00 RA  Dimensions ---------------------------------------------------------------------- RA Area (4C)                      62.1 cm2        <=18.0 RA Area (4C) Index              29.1 cm2/m2               RA ESV Index (4C MOD)            159 ml/m2         18-32 Aortic Valve ---------------------------------------------------------------------- Name                                 Value        Normal ---------------------------------------------------------------------- AV Doppler ---------------------------------------------------------------------- AV Peak Velocity                   1.6 m/s               AV Peak Gradient                   10 mmHg Mitral Valve  ---------------------------------------------------------------------- Name                                 Value        Normal ---------------------------------------------------------------------- MV Doppler ---------------------------------------------------------------------- MV Peak Velocity                   1.4 m/s               MV Mean Gradient                    2 mmHg               MV Regurgitation Doppler ---------------------------------------------------------------------- MR Peak Velocity                   3.8 m/s               MR VTI                              125 cm               MR PISA Radius                      0.9 cm               MR PISA Alias Velocity             40 cm/s               MR Vena Contracta                   0.3 cm               MR ERO (PISA)                     0.53 cm2               MR Volume (PISA)                     66 ml  MV Diastolic Function ---------------------------------------------------------------------- MV E Peak Velocity                113 cm/s               MV A Peak Velocity                 40 cm/s               MV E/A                                 2.9               MV Annular TDI ---------------------------------------------------------------------- MV Septal e' Velocity             8.2 cm/s         >=8.0 MV E/e' (Septal)                      13.8               MV Lateral e' Velocity            7.4 cm/s        >=10.0 MV E/e' (Lateral)                     15.3               MV e' Average                     7.8 cm/s               MV E/e' (Average)                     14.6 Tricuspid Valve ---------------------------------------------------------------------- Name                                 Value        Normal ---------------------------------------------------------------------- TV Regurgitation Doppler ---------------------------------------------------------------------- TR Peak Velocity                   3.2 m/s               Estimated  PAP/RSVP ---------------------------------------------------------------------- RA Pressure                        15 mmHg           <=5 RV Systolic Pressure               57 mmHg           <36 Pulmonic Valve ---------------------------------------------------------------------- Name                                 Value        Normal ---------------------------------------------------------------------- PV Doppler ---------------------------------------------------------------------- PV Peak Velocity                   1.2 m/s Aorta ---------------------------------------------------------------------- Name                                 Value        Normal ---------------------------------------------------------------------- Ascending Aorta ---------------------------------------------------------------------- Ao  Root Diameter (2D)               3.5 cm               Ao Root Diam Index (2D)          1.6 cm/m2               Ascending Aorta Diameter            4.5 cm Venous ---------------------------------------------------------------------- Name                                 Value        Normal ---------------------------------------------------------------------- IVC/SVC ---------------------------------------------------------------------- IVC Diameter (Insp 2D)              2.9 cm               IVC Diameter (Exp 2D)               3.2 cm         <=2.1  IVC Diameter Percent Change (2D)                                  10 %          >=50 Report Signatures Finalized by Devere JAYSON Banks on 09/03/2024 08:00 PM Resident Marsa Clara  MD on 09/03/2024 04:30 PM  ECG 12 Lead Result Date: 09/03/2024 ATRIAL FIBRILLATION WITH PREMATURE VENTRICULAR OR ABERRANTLY CONDUCTED BEATS LEFT AXIS DEVIATION RIGHT BUNDLE BRANCH BLOCK POSSIBLE INFERIOR INFARCT , AGE UNDETERMINED T WAVE ABNORMALITY, CONSIDER LATERAL ISCHEMIA ABNORMAL ECG WHEN COMPARED WITH ECG OF 03-Dec-2023 16:25, ATRIAL FIBRILLATION HAS REPLACED WIDE QRS  RHYTHM Confirmed by Antonetta Gull ([id number]) on 09/03/2024 10:06:10 AM  XR Chest 2 views Result Date: 09/02/2024 EXAM: XR CHEST 2 VIEWS ACCESSION: [id number] UN REPORT DATE: 09/02/2024 1:34 PM CLINICAL INDICATION: CHEST PAIN ; Chest Pain  TECHNIQUE: PA and Lateral Chest Radiographs COMPARISON: XR CHEST PORTABLE 11/28/2023 FINDINGS: Left subclavian port catheter with tip at the superior cavoatrial junction. Mild prominence of the pulmonary vasculature and mild interstitial thickening. No pulmonary edema. No pleural effusion or pneumothorax. Cardiac silhouette is enlarged.   *  Enlarged cardiac silhouette due to cardiomegaly and/or pericardial effusion. *  Pulmonary vascular congestion with probable mild interstitial pulmonary edema.    Discharge Instructions: Activity Instructions     Activity as tolerated         Other Instructions     Call MD for:  persistent nausea or vomiting     Call MD for:  severe uncontrolled pain     Call MD for: Temperature > 38.5 Celsius ( > 101.3 Fahrenheit)     Discharge instructions     DIAGNOSIS  -- You were admitted to [facility] with low blood pressure, volume overload and an irregular heart rhythm. You were treated with medication to decrease the amount of fluid in your body while maintaining your blood pressure. Follow up with our electrophysiology team and valve clinic have been arranged for you as noted in your AVS.   MEDICATIONS  -- Please refer to your after visit summary (AVS) medication list for updates or changes related to your hospital stay.   SIGNS AND SYMPTOMS TO MONITOR  -- If you begin to experience Severe dizziness , Chest pain/pressure, Shortness of breath , Slurred speech , Weakness or numbness  on one side of the body , Head injuries or loss of consciousness , or Fainting, confusion, or seizures  call 911 or go to the emergency department.     FOLLOW-UP APPOINTMENTS Your Primary Care Doctor is listed as Clinic, [facility]. Your follow-up  appointment is scheduled.   Please see the front page of your after visit summary (AVS) for your scheduled [facility] appointments and outstanding referrals.   You have been referred to the following specialties listed below. If you miss your appointment or do not hear from them please call them at the following number:   [facility] Cardiology [phone]   EMERGENCY CONTACT INFORMATION  -- If you have additional questions about your hospitalization please call [phone]. If needed, you can ask to discuss your symptoms or questions with the cardiology resident physician.  -- If it has been more than 7 days since your hospitalization, please reach out to your primary care physician for further questions and guidance.       Follow Up instructions and Outpatient Referrals    Ambulatory referral to Nutrition Services     Reason for referral: Patient would benefit from continued  education/follow-up for long-term nutrition plan to promote heart health,  prevent edema   Ambulatory Referral to Home Health     Reason for referral: 68 year old male with VF   Physician to follow patient's care: PCP   Disciplines requested:  Physical Therapy Occupational Therapy     Physical Therapy requested: Evaluate and treat   Occupational Therapy Requested: Evaluate and treat   Requested follow up plan: You would evaluate and manage.   Call MD for:  persistent nausea or vomiting     Call MD for:  severe uncontrolled pain     Call MD for: Temperature > 38.5 Celsius ( > 101.3 Fahrenheit)     Discharge instructions      Appointments which have been scheduled for you    Sep 23, 2024 9:20 AM RETURN CARDIOLOGY with Margery Ruth, MD [facility] CARDIOLOGY AT [facility]) [address] [place] [address] [phone]     Oct 14, 2024 4:00 PM (Arrive by 3:30 PM) NEW CARDIOLOGY with Norleen Deward Grumbling, MD [facility] MULTISPECIALTY SURGERY HEART VALVE [place] [facility] REGION) [address] [place] [address] [phone]     Oct 14, 2024 4:30 PM (Arrive by 4:00 PM) NEW CARDIAC SURGERY with Horris Clemmie Pock, MD [facility] MULTISPECIALTY SURGERY HEART VALVE [place] [facility] REGION) [address] [place] [address] [phone]         Length of Discharge: I spent greater than 30 mins in the discharge of this patient.

## 2024-09-17 NOTE — Care Plan (Signed)
 Shift Summary Midodrine was administered twice during the shift in response to cardiovascular status changes.  Providers were notified of changes in cardiac rhythm and MAP at two points during the shift.  Fall prevention interventions were consistently implemented, including bed alarms, frequent toileting, and environmental modifications.  No pain was reported, and comfort was maintained without additional interventions.  Overall, the shift was marked by ongoing cardiovascular monitoring and intervention, with safety and comfort maintained.

## 2024-09-17 NOTE — Care Plan (Signed)
  Problem: Adult Inpatient Plan of Care Goal: Absence of Hospital-Acquired Illness or Injury Outcome: Shift Focus Intervention: Prevent Skin Injury Recent Flowsheet Documentation Taken 09/17/2024 0815 by Shelah Eva MATSU, RN Positioning for Skin: Supine/Back Device Skin Pressure Protection: absorbent pad utilized/changed Skin Protection: adhesive use limited Intervention: Prevent and Manage VTE (Venous Thromboembolism) Risk Recent Flowsheet Documentation Taken 09/17/2024 1243 by Shelah Eva MATSU, RN Anti-Embolism Device Status: Refused Taken 09/17/2024 0815 by Shelah Eva MATSU, RN Anti-Embolism Device Status: Refused Goal: Readiness for Transition of Care Outcome: Shift Focus   Problem: Fall Injury Risk Goal: Absence of Fall and Fall-Related Injury Outcome: Shift Focus   Problem: Self-Care Deficit Goal: Improved Ability to Complete Activities of Daily Living Outcome: Shift Focus   Problem: Skin Injury Risk Increased Goal: Skin Health and Integrity Outcome: Shift Focus Intervention: Optimize Skin Protection Recent Flowsheet Documentation Taken 09/17/2024 0815 by Shelah Eva MATSU, RN Pressure Reduction Techniques: frequent weight shift encouraged Pressure Reduction Devices: pressure-redistributing mattress utilized Skin Protection: adhesive use limited

## 2024-09-18 NOTE — Care Plan (Signed)
 Shift Summary PRN oxyCODONE  was administered for left elbow pain, with pain scores remaining at 0 after intervention.  Infection prevention and aseptic technique were maintained throughout the shift.  Fall risk interventions, including alarms and regular toileting, were consistently implemented with no reported falls.  Cognition and neurological status remained stable and appropriate.  Overall, comfort and safety goals were supported and no new hospital-acquired issues were documented.   Absence of Hospital-Acquired Illness or Injury: Aseptic technique was consistently maintained and infection prevention measures such as hand hygiene and PPE were promoted throughout the shift. No new neurological symptoms were documented and skin pressure protection was provided with absorbent pads.   Optimal Comfort and Wellbeing: Pain in the left elbow was described as aching and constant, but pain scores remained at 0 throughout the shift, including after PRN oxyCODONE  administration.   Readiness for Transition of Care: Unplanned readmission scores remained stable and cognition was consistently documented as appropriate judgement.   Absence of Fall and Fall-Related Injury: Fall risk interventions were maintained, including regular toileting, bed/chair alarms, and hourly visual checks, with no falls or injuries reported.

## 2024-09-18 NOTE — Nursing Note (Signed)
   09/18/24 0850  Missed Session   Note Type Contact Note  Missed Therapy Reason Patient unwilling to participate (maybe this evening)  PT Missed Minutes 0

## 2024-09-19 NOTE — Care Plan (Signed)
 Shift Summary Pain medications were administered multiple times during the shift, with a decrease in reported left elbow pain from 8 to 5.   Potassium chloride  was given in the afternoon, and potassium levels improved.  IRF and LTACH candidacy scores remained stable, and the patient's status was flagged for clinical review.  Alarms were active and mobility was slightly limited, but no falls or injuries were documented.  Overall, the patient maintained stable vital signs and laboratory values, with ongoing needs for pain management and renal monitoring.   Readiness for Transition of Care: IRF and LTACH candidacy scores remained stable at 90 throughout the shift, and the patient's status was noted as needing clinical review; unplanned readmission score increased slightly over the shift.   Absence of Fall and Fall-Related Injury: Alarms were activated and audible during the shift, and activity and mobility were documented as walks occasionally and slightly limited, respectively.   Improved Ability to Complete Activities of Daily Living: Mobility was slightly limited and activity was walks occasionally, with 75% of meals eaten and nutrition assessed as adequate.   Electrolyte Balance: Potassium chloride  was administered, and potassium levels increased from 3.8 to 4.3 mmol/L; sodium, chloride, and magnesium  remained within normal limits, while renal function markers remained elevated.   Absence of Infection Signs and Symptoms: Temperature remained stable at 36.8C, sepsis risk score was low and decreased slightly, and WBC count was within normal range.

## 2024-09-20 NOTE — Progress Notes (Signed)
 ------------------------------------------------------------------------------- Attestation signed by Brock Odell Ned, MD at 09/20/24 1955 Attending Physician Attestation:    I confirm that I interviewed and examined the patient, reviewed all the relevant lab and diagnostic data, and we agreed upon a plan of treatment. I agree with the findings, assessment, and plan as documented by Dr. Tobie.  Odell Brock, MD  -------------------------------------------------------------------------------  Cardiology - Team 1 [facility]) Progress Note  Assessment & Plan:  Chris George is a 68 y.o. 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   Principal Problem:   Right ventricular failure    (CMS-HCC) Active Problems:   Permanent atrial fibrillation    (CMS-HCC)   Type 2 diabetes mellitus with stage 3a chronic kidney disease, without long-term current use of insulin  (CMS-HCC)   Major depressive disorder, single episode, mild   Acute on chronic heart failure with preserved ejection fraction (HFpEF) (CMS-HCC)   History of CVA (cerebrovascular accident)   Hyperlipidemia   Severe tricuspid regurgitation   Chronic kidney disease   Hypotension   Severe mitral regurgitation    Active Problems  HFmrEF dx 2018, now fully recovered  Hypotension  Home regimen includes: Spironolactone 25mg  daily, jardiance 10mg  daily and PO bumex  3mg  daily (patient unsure if this dosing is correct). Presented to clinica appointment with 30 lbs weight gain (175-->205),  bilateral lower extremity edema, orthopnea, severe dyspnea with exertion following recent hospitalization for C diff infection where his diuretics were paused. Secondary to severe valve disease as above. Admitted to [facility] and started on dobutamine assisted diuresis on 10/31 with IV lasix  and metolazone. Patient has been responsive to IV diuretic regimen with little impact on his pre-existing reduced renal function. However, hypotension has limited diuresis. Ultimately transferred to CICU 11/8 for further management. Continuing inotrope assisted diuresis. 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  TTE 09/03/2024: EF 55%, LV upper normal in size w/ mild increased wall thickness; RV severely dilated w/ mod reduced systolic function (worsened compared to 06/15/24); severe centrally directed MR; severe TR  TTE 09/11/24: EF >55%, severe MR, severe TR  - GDMT: Hold home spiro iso AKI and hypotension  - Continue PO Bumex  1 mg daily - Midodrine 15 mg TID - Strict I/Os - Maintain K >4; Mg >2 - BMP/Mag q12h - Daily weights  - C/f cardiac AL amyloidosis, workup: - Serum light chains abnormal, consist with known smoldering myeloma - Cardiac MRI: severely dilated RV, moderate to severe mitral regurgitation, enlarged pulmonary artery, No concern for infiltrative or amyloid disease.  - PM&R c/s for AIR, appreciate recs             - PVLs BLE negative for DVT - Plan for outpatient genetic testing   -EP eval   Severe TR  Severe MR  TTE on 09/03/24 with severe TR, severe centrally directed MR, and moderately reduced RV systolic fx, likely contributing to volume overload. The structural heart team recommended transfer from [facility] to [facility] for further evaluation. Oncology consulted for overall prognosis and feel patient should be considered for cardiac interventions to improve his current comorbidities and  his primary oncologist will assess his disease status in January. Structural cardiology  consulted and initiated workup for valvular intervention. TRISCEND protocol cardiac CT showing . TEE demonstrated severe tricuspid regurgitation with a restricted and short septal leaflet in addition to severe centrally directed mitral regurgitation. Despite dobutamine-assisted diuresis, valvular regurgitation appears largely unchanged.  Cardiac CT TAVR 09/10/24: Severe biatrial enlargement; dilated right ventricle with low normal systolic function; TV leaflets are not well visualized but appear with normal thickness and mobility; MV leaflets appear normal in thickness with normal mobility and no notable mitral annular calcification TEE 09/11/24: Severe centrally directed MR; severe TR with restricted and short septal leaflet; LV systolic function overall normal; LA severely dilated; RV systolic function mildly reduced; no LAA thrombus - Diuresis as above - Structural heart team consulted, appears no plan for inpatient intervention at this time  - Plan for outpatient follow up with valve clinic    AKI on CKD IV Baseline creatinine appears to be 2.3-2.6 (eGFR 25-29). Creatinine was 2.35 on admission 10/29 --> 2.82 on 11/3 with diuresis --> steady at 2.6-2.7 for several days, increased to 3.08 on 11/11. Now downtrending back at baseline. 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 - Hold IV diuresis above - BMP q12h - Renally dose as appropriate/avoid nephrotoxins   Atrial Fibrillation  Patient has undergone AFib ablation in the past per record. previously in rate controlled atrial fibrillation on telemetry, now in junctional bradycardia  - Anticoagulation: Continue home Eliquis  5 BID - Continue to monitor telemetry     Anemia Neutropenia Thrombocytopenia Patient has chronic anemia, neutropenia and thrombocytopenia all of which are followed by outpatient Oncologist. WBC 1.7 with ANC 0.7, Hgb 7.9, and Plt 96 on presentation to [facility] on 10/29. These blood counts have largely been stable, although WBC has risen  in response to prednisone  for gout flare. 1u pRBC transfused for hgb 6.5 on 11/4. - Daily CBC  - Active T&S     Chronic Problems   Metastatic Colon Cancer Prostate Cancer Chronic smoldering Myeloma Follows with [facility]. Patient has recurrent metastatic colon CA, treated recently with dose reduced FOLFIRI, with last PET on 08/25/2024 showing overall improvement, but still residual malignancy. When he last saw oncology on 09/01/24, they had planned for a chemotherapy holiday. He also has localized prostate cancer and completed XRT in 2024. He also has smoldering myeloma, initially diagnosed in 2013 and treated in [place], [place] with single agent Velcade. He declined maintenance treatment or referral for bone marrow transplant. Currently being monitored by oncologist. Most recent bone marrow bx in 2020 showed only 10-15% plasma cells.  - Per oncology, metastatic colon cancer is stable and patient should be considered for valvular intervention if he is a candidate  - No plans for cancer-directed therapy at this time   Peripheral neuropathy - Continue home gabapentin  200 mg BID   Constipation - Continue home Metamucil BID    DM2  HgbA1c up to 9.8 on 12/17/22. HgbA1c on admission 5.5. Not on any home meds, appears to be well controlled with lifestyle modifications - Monitor blood glucose on BMP   Gout Flare (resolved) Had been started on prednisone  taper and colchicine  earlier in hospitalization, but discontinued as patient no longer symptomatic.  - Prednisone  40 mg x 5 days (i11/15) - Continue allopurinol  50 mg every other day (renal dosing)  - Continue oxycodone  IR 5 mg q6hrs PRN - Continue Voltaren gel PRN   Hx of CVA  Aphasia  Patient had an ischemic CVA  04/05/23 with ongoing aphasia.  - Continue home atorvastatin  80mg  daily     The patient's presentation is complicated by the following clinically significant conditions requiring additional evaluation and  treatment: - Disorders of electrolytes, volume status, and acid/base status: - Volume overload requiring further investigation, treatment, or monitoring   Issues Impacting Complexity of Management: <redacted file path> -The patient is at high risk of complications from HFrEF     Daily Checklist: Diet: Regular Diet DVT PPx: Patient Already on Full Anticoagulation with eliquis  Electrolytes: No Repletion Needed Code Status: Full Code Dispo: Floor  Team Contact Information:  Primary Team: Cardiology - Team 1 (MEDC1) Primary Resident: Greig JINNY Blanch, DO Resident's Pager: [phone] (Cardiology Team 1 Intern)  Interval History:  Overnight, Chris George was bradycardic with systolic BP in the 80s. He was asymptomatic and denies any new concerns this morning. He notes his elbow pain has decreased significantly and he is now able to flex and extend his arm better.   ROS: Denies headache, chest pain, shortness of breath, abdominal pain, nausea, vomiting.  Objective:  Temp:  [36.5 C (97.7 F)-37 C (98.6 F)] 36.5 C (97.7 F) Pulse:  [44-58] 58 Resp:  [8-21] 14 BP: (80-110)/(31-46) 92/44 SpO2:  [98 %-100 %] 100 %  Gen: NAD, converses  HENT: atraumatic, normocephalic Heart: RRR Lungs: CTAB, no crackles or wheezes Abdomen: soft, NTND Extremities: mild tenderness, warmth and edema of the left elbow

## 2024-09-20 NOTE — Care Plan (Signed)
 Shift Summary Morning labs revealed anemia, thrombocytopenia, and elevated BUN/creatinine, but electrolytes were within normal limits. Cardiac rhythm remained junctional with frequent ectopy, and MAP improved over the course of the shift. Oxygenation was maintained on room air with SpO2 consistently above 98%. No falls or injuries occurred, and safety measures were consistently implemented. Overall, the shift was stable with no acute changes or new complications documented.  Absence of Hospital-Acquired Illness or Injury: No new hospital-acquired illness or injury was documented during the shift, and temperature and sepsis risk scores remained stable throughout the day.  Absence of Fall and Fall-Related Injury: No falls or fall-related injuries occurred, and safety interventions such as bed alarms and side rails were in place during the shift.  Electrolyte Balance: Electrolyte values from the morning labs were within normal limits, with sodium, potassium, and magnesium  all in the reference range.  Stable Heart Rate and Rhythm: Junctional rhythm and frequent ectopy persisted throughout the shift, with cardiac monitoring maintained and MAP gradually increasing from low to higher values by the end of the shift.  Effective Oxygenation and Ventilation: Oxygen saturation remained high on room air, and respiratory assessments were within defined limits despite some fluctuations in respiratory rate.

## 2024-09-21 ENCOUNTER — Other Ambulatory Visit: Payer: Self-pay

## 2024-09-21 NOTE — Consults (Signed)
 Physical Medicine and Rehabilitation Inpatient Consult Note  Requesting Attending Physician: Lucendia Rome, MD Service Requesting Consult: Cardiology [facility]) Thank you for this consult.  Please contact the PM&R consult pager [phone]) for questions regarding these recommendations.  For questions of bed availability at [facility], contact the Intake Office at [phone]. Please contact your case manager for updates on AIR process as they are in regular contact with the rehab intake office.  ASSESSMENT:  Chris George is a 68 y.o. male with a past medical history of prostate cancer s/p RT, metastatic recurrent colon cancer  s/p FOLFOX, FOLFIRI, CKD, RHF, afib, prior CVA currently hospitalized on 09/02/2024 for volume overload.  RECOMMENDATIONS: #Volume overload ISO CKD/HF - Strict I/Os - Monitor renal function - Daily weights - s/p diuresis - Goals of K>4, Mg>2 - Midodrine for hypotension  #HF/afib/CVA - s/p afib ablation x 3 - Atorva/Eliquis  home medications  #CKD - Monitor BMP daily - Avoid nephrotoxic medications - Strict I/Os  #Gout - Prednisone /allopurinol  - Voltaren PRN  DISCHARGE RECOMMENDATIONS: - Recommendation: ACUTE INPATIENT REHABILITATION (AIR). - Patient/family agreed to [facility] AIR. - P2P done with insurance company today and DENIED. Communicated decision to patient and medical team/CM.  FOLLOW UP: - Please place an Ambulatory referral by entering cancer rehab in Epic and select [facility] as the Department in the PM+R referral to schedule follow up with Dr. Sasha Knowlton.   AIR PLANNING: *This PM&R consult does not guarantee that patient has been accepted to [facility] AIR, but can be helpful to guide patient progression*  Checklist For Potential Admission to [facility] AIR: [ ]  EDD/what is estimated discharge date? [ ]  Referral from CM if wants [facility] AIR [ ]  PT/OT within 48 hours of AIR [ ]  dispo verified and [facility] AIR preference [ ]  CBC/BMP & other pertinent labs  within 48 hours of AIR [ ]  All SCI patients and those with hypercoagulable state (COVID, transplant, cancer, etc) need PVL BLE within one week prior to AIR [ ]  Chemotherapy and biologics: Require AIR physician and nursing approval; Oral chemo and some infusions (Cytoxan) are often acceptable (M-F administration secondary to daytime nurse staffing).   The Intake Team will verify staffing availability with Nurse Management; IT chemo cannot be done at [facility] and those patients will need to stay at main until treatments are completed [ ]  Chemotherapy plan documented  Discharge Plan After AIR: - patient lives alone in an apartment with  22STE. - patient will have assistance from mother after discharge.  In order for the patient to be considered for admission to [facility] inpatient rehab, the case manager must give the patient / family choice in post-acute discharge options. If the patient desires [facility], a referral from case management is required for acceptance to [facility] inpatient rehab.  A referral for [facility] inpatient rehab has not been received.   Vena CHARLENA Lolling, MD [facility] PM+R  SUBJECTIVE  Reason for Consult: Patient seen in consultation at the request of Lucendia Rome, MD for evaluation of rehabilitation needs and recommendations.  History of Present Illness: Chris George is a 68 y.o. male with a past medical history of prostate cancer s/p RT, metastatic recurrent colon cancer  s/p FOLFOX, FOLFIRI, CKD, RHF, afib, prior CVA currently hospitalized on 09/02/2024 for volume overload.  Per review of EMR, he was admitted from clinic ISO volume overload and underwent diuresis. On work up he was found to have both tricuspid regurgitation and mitral regurgitation. He was transferred to the CICU for intermittent  hypotension and was started on midodrine.  Medical Complexity: #Volume overload ISO CKD/HF - Strict I/Os - Monitor renal function - Daily weights - s/p diuresis - Goals of K>4, Mg>2 - Midodrine for  hypotension  #HF/afib/CVA - s/p afib ablation x 3 - Atorva/Eliquis  home medications  #CKD - Monitor BMP daily - Avoid nephrotoxic medications - Strict I/Os  #Gout - Prednisone /allopurinol  - Voltaren PRN  #Pancytopenia - Daily CBC  #Metastatic colon cancer/localized prostate cancer/smoldering myeloma - Seen by oncology 11/4 - Currently on treatment holiday x 3 months per OSH oncology team  # Impaired ADLs, fall risk, impaired functional gait, mobility, balance, and endurance - Please continue to have patient work with PT and OT to maximize functional status with mobility and ADLs. - Fall risk precautions and OOB to chair daily with assistance if appropriate - Discussed rehab options today with patient. The patient/family/caregiver was counseled and educated on the purpose of AIR and questions/concerns were addressed.   Today Patient states he is feeling overall and wants to go home.  Past Medical and Surgical History:  Past Medical History[1] Past Surgical History[2]   Cancer History: Hematology/Oncology History   No problem history exists.    Social History:  Short Social History[3]  Social History   Social History Narrative  . Not on file    Living Environment: Apartment Lives With: Alone Home Living: One level home, Stairs to enter with rails, Tub/shower unit, Standard height toilet Rail placement (outside): Bilateral rails in reach Number of Stairs to Enter (outside): 22 Caregiver Identified?: Yes Caregiver Availability: Intermittent (I live alone  reports his mother lives very close by w/ his brother) Caregiver Ability: Supervision  Family History: Reviewed, noncontributory to rehab family history includes Heart murmur in his father and paternal uncle; Hyperlipidemia in his maternal aunt; Hypertension in his maternal aunt.  Allergies:  Patient has no known allergies.  Medications:  Scheduled Scheduled meds with Route[4] PRN acetaminophen , 650 mg,  Q6H PRN calcium  carbonate, 400 mg elem calcium , TID PRN cyclobenzaprine, 10 mg, TID PRN dextrose  in water , 12.5 g, Q15 Min PRN diclofenac sodium, 2 g, QID PRN glucagon, 1 mg, Once PRN glucose, 16 g, Q10 Min PRN guaiFENesin, 200 mg, Q4H PRN melatonin, 3 mg, Nightly PRN oxyCODONE , 5 mg, Q6H PRN polyethylene glycol, 17 g, Daily PRN senna, 2 tablet, Nightly PRN   Continuous Infusions    Review of Systems:   General ROS: negative  Prior Functional Status: Prior Functional Status: pt reports he is currently using no AD and has had no falls. He reports he learned his lesson not to climb the steps with numerous bags of groceries.  Current Functional Status: Therapy notes reviewed   Mobility:  Bed Mobility comments: supine to/from sitting: indep.     Ambulation comments: ambulated additional 300' w/ rollator: SBA, no LOB but did have mild intermittent lateral sway PT Post Acute Discharge Recommendations: 5x weekly, High intensity  Cognition, Swallow, Speech:  Cognition / Swallow / Speech Patient's Vision Adequate to Safely Complete Daily Activities: Yes Patient's Judgement Adequate to Safely Complete Daily Activities: Yes Patient's Memory Adequate to Safely Complete Daily Activities: Yes Patient Able to Express Needs/Desires: Yes Patient has speech problem: No        Assistive Devices: Straight cane, Rolling walker, Rollator  Precautions: Safety Interventions Safety Interventions: bed alarm, commode/urinal/bedpan at bedside, fall reduction program maintained, low bed, nonskid shoes/slippers when out of bed Aspiration Precautions: awake/alert before oral intake, upright posture maintained, respiratory status monitored Bleeding Precautions: monitored  for signs of bleeding Infection Management: aseptic technique maintained Isolation Precautions: protective precautions maintained Neutropenic Precautions: Good handwashing  OBJECTIVE:  Vitals: Vitals:   09/21/24 0032 09/21/24  0337 09/21/24 0515 09/21/24 0825  BP: 106/40 106/54  96/53  Pulse: (!) 49 52 53 51  Resp: 13 11 13 15   Temp: 36.6 C (97.8 F) 36.4 C (97.5 F)  36.8 C (98.2 F)  TempSrc: Oral Oral  Oral  SpO2: 99% 100% 100% 100%  Weight:      Height:        Physical Exam:   GEN: Lying in bed in NAD. HEENT: Atraumatic. Normocephalic. Moist mucous membranes. Trachea midline. RESP: NWOB on RA. CV: Warm, well-perfused PSYCH: Appropriate SKIN: see wound care flow sheet  NEURO: Mental Status: A&Ox3 (person, place, date), attention intact, speech fluid and coherent, follows commands well and answers questions appropriately  Labs and Diagnostic Studies:   Personally reviewed  CBC - Results in Past 30 Days Result Component Current Result Ref Range Previous Result Ref Range  HCT 20.5 (L) (09/21/2024) 39.0 - 48.0 % 21.0 (L) (09/20/2024) 39.0 - 48.0 %  HGB 6.8 (L) (09/21/2024) 12.9 - 16.5 g/dL 7.2 (L) (88/83/7974) 87.0 - 16.5 g/dL  MCH 70.0 (88/82/7974) 25.9 - 32.4 pg 30.2 (09/20/2024) 25.9 - 32.4 pg  MCHC 33.3 (09/21/2024) 32.0 - 36.0 g/dL 65.7 (88/83/7974) 67.9 - 36.0 g/dL  MCV 10.2 (88/82/7974) 77.6 - 95.7 fL 88.4 (09/20/2024) 77.6 - 95.7 fL  MPV 8.1 (09/21/2024) 6.8 - 10.7 fL 8.1 (09/20/2024) 6.8 - 10.7 fL  Platelet 99 (L) (09/21/2024) 150 - 450 10*9/L 95 (L) (09/20/2024) 150 - 450 10*9/L  RBC 2.28 (L) (09/21/2024) 4.26 - 5.60 10*12/L 2.37 (L) (09/20/2024) 4.26 - 5.60 10*12/L  WBC 3.9 (09/21/2024) 3.6 - 11.2 10*9/L 4.8 (09/20/2024) 3.6 - 11.2 10*9/L   BMP - Results in Past 30 Days Result Component Current Result Ref Range Previous Result Ref Range  BUN 78 (H) (09/20/2024) 9 - 23 mg/dL 75 (H) (88/83/7974) 9 - 23 mg/dL  Chloride 99 (88/83/7974) 98 - 107 mmol/L 100 (09/20/2024) 98 - 107 mmol/L  CO2 21.0 (09/20/2024) 20.0 - 31.0 mmol/L 23.0 (09/20/2024) 20.0 - 31.0 mmol/L  Creatinine 2.97 (H) (09/20/2024) 0.73 - 1.18 mg/dL 6.97 (H) (88/83/7974) 9.26 - 1.18 mg/dL  Glucose 770 (H) (88/83/7974) 70 -  179 mg/dL 875 (88/83/7974) 70 - 820 mg/dL  Potassium 4.1 (88/83/7974) 3.4 - 4.8 mmol/L 4.2 (09/20/2024) 3.4 - 4.8 mmol/L  Sodium 136 (09/20/2024) 135 - 145 mmol/L 137 (09/20/2024) 135 - 145 mmol/L   LFT's - Results in Past 30 Days Result Component Current Result Ref Range Previous Result Ref Range  Albumin 2.9 (L) (09/20/2024) 3.4 - 5.0 g/dL 2.8 (L) (88/84/7974) 3.4 - 5.0 g/dL  Alkaline Phosphatase 557 (H) (09/20/2024) 46 - 116 U/L 354 (H) (09/19/2024) 46 - 116 U/L  ALT 48 (09/20/2024) 10 - 49 U/L 29 (09/19/2024) 10 - 49 U/L  AST 58 (H) (09/20/2024) <=34 U/L 32 (09/19/2024) <=34 U/L  Bilirubin, Direct 0.30 (09/20/2024) 0.00 - 0.30 mg/dL 9.59 (H) (88/84/7974) 9.99 - 0.30 mg/dL  Total Bilirubin 0.7 (88/83/7974) 0.3 - 1.2 mg/dL 1.0 (88/84/7974) 0.3 - 1.2 mg/dL    Radiology Results:   Reviewed MRI lumbar spine from 08/01/24 from OSH: IMPRESSION:  1. No acute abnormality within the lumbar spine.  2. Diffusely decreased T1 signal intensity within the visualized  bone marrow, with relatively preserved fatty marrow changes within  the L2 and L3 vertebral bodies. Findings likely reflect post XRT  changes.  No other evidence for metastatic disease or active myeloma  within the lumbar spine.  3. Mild multilevel degenerative disc disease and facet hypertrophy  as above. No significant spinal stenosis. Mild to moderate bilateral  L4 and L5 foraminal narrowing related to disc bulge and facet  hypertrophy.       This encounter was a subsequent hospital care (follow up consultation).        [1] Past Medical History: Diagnosis Date  . Colon cancer    (CMS-HCC)   . Multiple myeloma (CMS-HCC)   . Prostate cancer    (CMS-HCC)   . Stroke (cerebrum) (CMS-HCC) 04/09/2023  [2] Past Surgical History: Procedure Laterality Date  . COLONOSCOPY    . PR RIGHT HEART CATH O2 SATURATION & CARDIAC OUTPUT N/A 11/27/2023   Procedure: Right Heart Catheterization;  Surgeon: Gil Franky Barter, MD;   Location: [facility] CATH;  Service: Cardiology  [3] Social History Tobacco Use  . Smoking status: Never    Passive exposure: Never  . Smokeless tobacco: Never  Vaping Use  . Vaping status: Never Used  Substance Use Topics  . Alcohol use: Not Currently    Comment: not in over 20 years per pt  . Drug use: Not Currently    Types: Marijuana, Cocaine    Comment: not in over 20 years per pt  [4] . allopurinol  (ZYLOPRIM ) tablet 50 mg Every other day  . apixaban  (ELIQUIS ) tablet 5 mg BID  . atorvastatin  (LIPITOR) tablet 80 mg Daily  . bumetanide  (BUMEX ) tablet 1 mg Daily  . calcitriol  (ROCALTROL ) capsule 0.25 mcg Daily  . flu vacc 2025-26 (65 yr up) (PF)(FLUAD)45 mcg(57mcgx3)/0.5 ml IM syringe During hospitalization  . gabapentin  (NEURONTIN ) capsule 200 mg BID  . midodrine (PROAMATINE) tablet 15 mg TID  . predniSONE  (DELTASONE ) tablet 40 mg Daily  . psyllium (METAMUCIL) packet 1 packet BID  . tamsulosin  (FLOMAX ) 24 hr capsule 0.8 mg Daily

## 2024-09-21 NOTE — Care Plan (Signed)
 Shift Summary PRN red blood cell transfusion was administered at 12:28 PM following morning labs that showed low hemoglobin and hematocrit.  Cardiac monitoring and fall prevention interventions were maintained throughout the shift.  No new skin abnormalities or discomfort were reported, and pain remained at 0.  Electrolyte and metabolic labs were reviewed and found to be within normal limits for key electrolytes.  Overall, the shift was stable with ongoing monitoring and interventions as indicated by the patient's condition.  Absence of Hospital-Acquired Illness or Injury: Aseptic technique and hand hygiene were maintained throughout the shift, and no new abnormalities were documented; skin assessment continued to note bruising without change.  Optimal Comfort and Wellbeing: Pain remained at 0 throughout the shift and no discomfort was reported or observed.  Absence of Fall and Fall-Related Injury: Fall prevention measures such as bed alarms, hourly checks, and toileting were consistently implemented, and no falls or injuries occurred.  Electrolyte Balance: Electrolyte values were within normal limits on the morning lab draw, with sodium, potassium, chloride, and magnesium  all in the reference range.  Stable Heart Rate and Rhythm: Cardiac rhythm remained junctional with occasional ectopy, and heart rate was monitored throughout the shift; no cardiac symptoms were reported or documented.

## 2024-09-21 NOTE — Nursing Note (Signed)
   09/21/24 1123  Missed Session   Note Type Contact Note  Missed Therapy Reason Medically not appropriate (Hgb 6.8, COTA will f/u as appropriate)  OT Missed Minutes 0

## 2024-09-21 NOTE — Care Plan (Signed)
 Shift Summary Oxycodone  was administered for left leg pain, resulting in effective pain control for the remainder of the shift.   Patient participated in activity by sitting in chair and required only standby assistance, with no symptoms noted during mobility tasks.   Junctional rhythm and frequent ectopy were present throughout the shift, with continuous telemetry monitoring.   Oxygenation remained adequate on room air, and breath sounds were consistently clear.   Patient remained engaged in care and psychosocial status was stable.   Optimal Comfort and Wellbeing: Pain in the left leg increased to 6/10 and was described as aching and burning; after administration of oxycodone , pain decreased to 0/10 and remained controlled for the rest of the shift.   Readiness for Transition of Care: Psychosocial status remained within defined limits and the patient was able to participate in activity and mobility tasks with standby assistance and supervision.   Improved Ability to Complete Activities of Daily Living: Patient was up in chair multiple times and required only standby assistance, with no symptoms noted during activity.   Stable Heart Rate and Rhythm: Junctional rhythm persisted throughout the shift with frequent ectopy, and telemetry monitoring was maintained.   Effective Oxygenation and Ventilation: Respiratory rate decreased slightly but remained within a safe range, SpO2 stayed at or above 99% on room air, and breath sounds were clear with a regular, unlabored respiratory pattern.

## 2024-09-22 ENCOUNTER — Ambulatory Visit

## 2024-09-22 ENCOUNTER — Ambulatory Visit: Admitting: Oncology

## 2024-09-22 ENCOUNTER — Other Ambulatory Visit

## 2024-09-22 NOTE — Nursing Note (Signed)
-------------------------------------------------------------------------------   Summary: Plan of Care -------------------------------------------------------------------------------  Shift Summary:  Patient was anxious at the beginning of the shift about his lower extremities swelling getting worst. He believed that because his bumex  dose was decreased from 1.5 mg to 1 mg. Sinus bradycardia overnight with 45 as the lowest documented. BP was soft as well, midodrine given. He denies chest pain, dizziness and shortness of breath. Had 6 beats of V-tach, patient asymptomatic and provider was notified.  Diclofenac cream PRN was administered on his left arm for gout flare and applied elastic bandage to help relieve his pain. Kept comfortable on bed, call light within reach.

## 2024-09-22 NOTE — Care Plan (Signed)
 Care Management Final Transition Planning Assessment    09/22/2024  Patient's Post Acute Contact Information: [phone] (M)     Has a PCP appointment been made?: No   Has a specialist appointment been made?: Yes  Future Appointments  Date Time Provider Department Center  09/23/2024  9:20 AM Jama Old, MD [facility]  10/14/2024  4:00 PM Charm Norleen Mt, MD [facility]  10/14/2024  4:30 PM Merlo, Horris Miguel, MD [facility]      Post Acute Facility needed at discharge?: No   Home Care/ Home Medical Equipment needed at discharge?: Yes Home Care/ Home Medical Equipment: Home Health (specify)       Home Care [facility]  - Discharged on 09/22/2024 Admission date: 09/02/2024 - Discharge disposition: Home or Assisted Living  with Home Health    Service Provider Services Address Referrals Phone Referrals Fax Patient Preferred   [facility] Primary: Home Health Care Secondary: Home Occupational Therapy, Home Physical Therapy [address] [place] [address] [phone] --       Outpatient/Community Referrals needed for discharge?: Yes  Outpatient/Community Resources: Other:  see below Agency detail (Name/Phone #): Followed by Cancer Center team - cancer counselor and Speech therapist  Transportation Anticipated: taxi  Human Resources Officer Address Referrals Phone Referrals Fax   [facility]  [employer] [facility]  In Use [employer] [address], [address] [place] [address] [phone] --       Currently receiving outpatient dialysis?: No       Discharge Disposition: Home w/ Home Health     IMM Delivery Follow Up Important Message (IM) letter given to patient and/or family?: Yes IMM Delivered by: The IMM was delivered and authorized representative telephone  confirmation obtained. IMM Delivery Date: 09/22/24 IMM Delivery Time: 1050 The beneficiary verbalized understanding of the rights of the hospitalized patient and the right to appeal the discharge decision?: Yes The patient/authorized representative was advised they have 4 hours to consider their right to request a QIO review prior to leaving the hospital, if the physician orders discharge today.: Yes The patient/authorized representative verbalized understanding of this right.: Yes  Quality data for continuing care services shared with patient and/or representative?: Yes Patient and/or family were provided with choice of facilities / services that are available and appropriate to meet post hospital care needs?: Yes  List choices in order highest to lowest preferred, if applicable. : [facility] was only choice     Final Assessment Complete: Yes                     Readmission Risk Score:  Predictive Model Details        32% (High)  Factor Value   Calculated 09/22/2024 12:05 18% Number of active inpatient medication orders 36   [facility] Risk of Unplanned Readmission Model 14% Current length of stay 19.806 days    8% Diagnosis of cancer present    7% ECG/EKG order present in last 6 months    6% Charlson Comorbidity Index 7    6% Latest BUN high (86 mg/dL)    6% Encounter of ten days or longer in last year present    5% Imaging order present in last 6 months    4% Latest hemoglobin low (7.6 g/dL)    4% Number of ED visits in last  six months 1    4% Age 68    4% Number of hospitalizations in last year 1    3% Diagnosis of deficiency anemia present    3% Active anticoagulant inpatient medication order present    3% Active corticosteroid inpatient medication order present    3% Latest creatinine high (2.68 mg/dL)    3% Diagnosis of renal failure present    1% Future appointment scheduled

## 2024-09-22 NOTE — Progress Notes (Deleted)
 09/22/2024 Chris George [id number] 1956-07-17  Gastroenterology Office Note     Primary Care Physician:  Jeffie Cheryl FORBES, MD  Primary GI Provider: Jinny Carmine, MD    Chief Complaint   No chief complaint on file.    History of Present Illness   Chris George is a 68 y.o. male with PMHX of PMHX of metastatic colon cancer, prostate cancer, A-fib on Eliquis , chemotherapy induced neuropathy, diabetes, stage IV chronic kidney disease, presenting today    Patient last seen by myself on 08/12/2024 following hospitalization for C. difficile infection    Past Medical History:  Diagnosis Date   A-fib (HCC)    Acute kidney injury superimposed on chronic kidney disease 11/19/2023   Acute on chronic heart failure (HCC) 11/19/2023   Acute on chronic heart failure with preserved ejection fraction (HCC) 11/19/2023   Anemia    Arthritis    ankles   Cancer (HCC)    multiple myeloma   Cancer of transverse colon (HCC) 10/26/2019   Cardiomyopathy (HCC)    Chemotherapy-induced peripheral neuropathy    CHF (congestive heart failure) (HCC)    Depression    Dysrhythmia    atrial fib   Elevated troponin 11/19/2023   History of CVA (cerebrovascular accident)    HLD (hyperlipidemia)    Hypercholesteremia    Hypertension    Moderate tricuspid insufficiency    Multiple myeloma (HCC)    not being treated right now per pt 11/11/19   Pneumonia    Priapism 07/25/2012   Severe mitral regurgitation 09/13/2024   Sleep apnea    No CPAP.  OSA resolved with wt loss.    Past Surgical History:  Procedure Laterality Date   ATRIAL ABLATION SURGERY     CARDIAC SURGERY     A-Fib Ablations   COLONOSCOPY N/A 03/17/2024   Procedure: COLONOSCOPY;  Surgeon: Jinny Carmine, MD;  Location: [facility] ENDOSCOPY;  Service: Endoscopy;  Laterality: N/A;   COLONOSCOPY WITH PROPOFOL  N/A 10/26/2019   Procedure: COLONOSCOPY WITH BIOPSY;  Surgeon: Jinny Carmine, MD;  Location: [facility] SURGERY CNTR;  Service:  Endoscopy;  Laterality: N/A;   COLONOSCOPY WITH PROPOFOL  N/A 09/03/2022   Procedure: COLONOSCOPY WITH PROPOFOL ;  Surgeon: Jinny Carmine, MD;  Location[facility] SURGERY [facility];  Service: Endoscopy;  Laterality: N/A;   ESOPHAGOGASTRODUODENOSCOPY N/A 03/17/2024   Procedure: GI Bleeding;  Surgeon: Jinny Carmine, MD;  Location: [facility] ENDOSCOPY;  Service: Endoscopy;  Laterality: N/A;  GI Bleeding, EGD   ESOPHAGOGASTRODUODENOSCOPY (EGD) WITH PROPOFOL  N/A 10/26/2019   Procedure: ESOPHAGOGASTRODUODENOSCOPY (EGD) WITH BIOPSY;  Surgeon: Jinny Carmine, MD;  Location: [facility] SURGERY CNTR;  Service: Endoscopy;  Laterality: N/A;  sleep apnea   LAPAROSCOPIC PARTIAL COLECTOMY Right 11/17/2019   Procedure: LAPAROSCOPIC PARTIAL COLECTOMY RIGHT EXTENDED;  Surgeon: Desiderio Schanz, MD;  Location: [facility] ORS;  Service: General;  Laterality: Right;   PORTACATH PLACEMENT N/A 12/28/2019   Procedure: INSERTION PORT-A-CATH Left subclavian;  Surgeon: Desiderio Schanz, MD;  Location: [facility] ORS;  Service: General;  Laterality: N/A;    Current Outpatient Medications  Medication Sig Dispense Refill   apixaban  (ELIQUIS ) 5 MG TABS tablet Take 5 mg by mouth 2 (two) times daily.     atorvastatin  (LIPITOR) 80 MG tablet Take 80 mg by mouth daily.     bumetanide  (BUMEX ) 2 MG tablet Take 1.5 mg by mouth daily. Taking one in the morning.     calcitRIOL  (ROCALTROL ) 0.25 MCG capsule Take 0.25 mcg by mouth daily.     colchicine  0.6 MG tablet  Take 1 tablet (0.6 mg total) by mouth daily as needed. Until flare resolves.     diphenoxylate-atropine  (LOMOTIL) 2.5-0.025 MG tablet Take 1 tablet by mouth 4 (four) times daily as needed for diarrhea or loose stools. 30 tablet 0   gabapentin  (NEURONTIN ) 100 MG capsule Take 2 capsules (200 mg total) by mouth 2 (two) times daily. 60 capsule 1   hydrocortisone  (ANUSOL -HC) 2.5 % rectal cream Place 1 Application rectally 2 (two) times daily. 30 g 0   lidocaine -prilocaine  (EMLA ) cream Apply 1 Application topically daily.      magnesium  chloride (SLOW-MAG) 64 MG TBEC SR tablet Take 1 tablet (64 mg total) by mouth daily. 60 tablet 1   Multiple Vitamin (MULTIVITAMIN ADULT PO) Take 1 tablet by mouth daily.     ondansetron  (ZOFRAN ) 8 MG tablet Take 1 tablet (8 mg total) by mouth every 8 (eight) hours as needed for nausea or vomiting. 20 tablet 0   oxyCODONE -acetaminophen  (PERCOCET/ROXICET) 5-325 MG tablet Take 1-2 tablets by mouth every 6 (six) hours as needed for severe pain (pain score 7-10). 60 tablet 0   prochlorperazine  (COMPAZINE ) 10 MG tablet Take 1 tablet (10 mg total) by mouth every 6 (six) hours as needed for nausea or vomiting. 30 tablet 0   PSYLLIUM PO Take 1 packet by mouth.     sodium bicarbonate 650 MG tablet Take 1 tablet (650 mg total) by mouth 2 (two) times daily. 120 tablet 1   [Paused] spironolactone (ALDACTONE) 25 MG tablet Take 1 tablet by mouth every morning. (Patient not taking: Reported on 09/01/2024)     tamsulosin  (FLOMAX ) 0.4 MG CAPS capsule Take 1 capsule (0.4 mg total) by mouth daily after supper. 30 capsule 6   valACYclovir (VALTREX) 1000 MG tablet Take 1 tablet (1,000 mg total) by mouth daily. 10 tablet 0   No current facility-administered medications for this visit.   Facility-Administered Medications Ordered in Other Visits  Medication Dose Route Frequency Provider Last Rate Last Admin   heparin  lock flush 100 unit/mL  500 Units Intravenous Once Finnegan, Timothy J, MD        Allergies as of 09/23/2024   (No Known Allergies)    Family History  Problem Relation Age of Onset   Healthy Mother    Colon cancer Father    Parkinson's disease Father    Colon cancer Brother     Social History   Socioeconomic History   Marital status: Single    Spouse name: Not on file   Number of children: Not on file   Years of education: Not on file   Highest education level: Not on file  Occupational History   Not on file  Tobacco Use   Smoking status: Never    Passive exposure: Never    Smokeless tobacco: Never  Vaping Use   Vaping status: Never Used  Substance and Sexual Activity   Alcohol use: Not Currently   Drug use: Never   Sexual activity: Yes    Birth control/protection: None  Other Topics Concern   Not on file  Social History Narrative   Not on file   Social Drivers of Health   Financial Resource Strain: Low Risk  (08/10/2024)   Received from [facility]   Overall Financial Resource Strain (CARDIA)    Difficulty of Paying Living Expenses: Not hard at all  Recent Concern: Financial Resource Strain - Medium Risk (06/09/2024)   Received from [facility]   Overall Financial Resource Strain (  CARDIA)    Difficulty of Paying Living Expenses: Somewhat hard  Food Insecurity: No Food Insecurity (08/10/2024)   Received from [facility]   Hunger Vital Sign    Within the past 12 months, you worried that your food would run out before you got the money to buy more.: Never true    Within the past 12 months, the food you bought just didn't last and you didn't have money to get more.: Never true  Recent Concern: Food Insecurity - Food Insecurity Present (08/01/2024)   Hunger Vital Sign    Worried About Running Out of Food in the Last Year: Sometimes true    Ran Out of Food in the Last Year: Sometimes true  Transportation Needs: No Transportation Needs (08/10/2024)   Received from [facility] - Transportation    In the past 12 months, has lack of transportation kept you from medical appointments or from getting medications?: No    Lack of Transportation (Non-Medical): No  Physical Activity: Inactive (11/08/2023)   Exercise Vital Sign    Days of Exercise per Week: 0 days    Minutes of Exercise per Session: 0 min  Stress: Stress Concern Present (05/28/2019)   [employer] of Occupational Health - Occupational Stress Questionnaire    Feeling of Stress : To some extent  Social Connections:  Unknown (08/01/2024)   Social Connection and Isolation Panel    Frequency of Communication with Friends and Family: More than three times a week    Frequency of Social Gatherings with Friends and Family: More than three times a week    Attends Religious Services: Not on file    Active Member of Clubs or Organizations: Yes    Attends Banker Meetings: Not on file    Marital Status: Divorced  Intimate Partner Violence: Not At Risk (08/01/2024)   Humiliation, Afraid, Rape, and Kick questionnaire    Fear of Current or Ex-Partner: No    Emotionally Abused: No    Physically Abused: No    Sexually Abused: No     RELEVANT GI HISTORY, IMAGING AND LABS: CBC    Component Value Date/Time   WBC 1.5 (L) 09/01/2024 0832   WBC 1.3 (L) 08/27/2024 1024   RBC 2.79 (L) 09/01/2024 0832   HGB 8.1 (L) 09/01/2024 0832   HGB 14.3 05/13/2013 1116   HCT 25.5 (L) 09/01/2024 0832   HCT 42.9 05/13/2013 1116   PLT 85 (L) 09/01/2024 0832   PLT 169 05/13/2013 1116   MCV 91.4 09/01/2024 0832   MCV 84 05/13/2013 1116   MCH 29.0 09/01/2024 0832   MCHC 31.8 09/01/2024 0832   RDW 18.2 (H) 09/01/2024 0832   RDW 13.9 05/13/2013 1116   LYMPHSABS 0.2 (L) 09/01/2024 0832   LYMPHSABS 0.9 (L) 05/13/2013 1116   MONOABS 0.5 09/01/2024 0832   MONOABS 0.4 05/13/2013 1116   EOSABS 0.1 09/01/2024 0832   EOSABS 0.1 05/13/2013 1116   BASOSABS 0.0 09/01/2024 0832   BASOSABS 0.0 05/13/2013 1116   Recent Labs    07/29/24 1519 07/30/24 0553 07/31/24 1510 08/01/24 0514 08/02/24 0342 08/04/24 1316 08/11/24 0849 08/21/24 1233 08/27/24 1024 09/01/24 0832  HGB 8.0* 7.1* 7.8* 6.6* 8.1* 8.0* 7.4* 6.8* 7.8* 8.1*    CMP     Component Value Date/Time   NA 137 09/01/2024 0832   NA 143 05/13/2013 1116   K 3.9 09/01/2024 0832   K 3.5 05/13/2013 1116   CL 101 09/01/2024  0832   CL 102 05/13/2013 1116   CO2 27 09/01/2024 0832   CO2 32 05/13/2013 1116   GLUCOSE 152 (H) 09/01/2024 0832   GLUCOSE 122 (H)  05/13/2013 1116   BUN 54 (H) 09/01/2024 0832   BUN 14 05/13/2013 1116   CREATININE 2.45 (H) 09/01/2024 0832   CREATININE 1.53 (H) 05/13/2013 1116   CALCIUM  8.6 (L) 09/01/2024 0832   CALCIUM  8.6 05/13/2013 1116   PROT 6.6 09/01/2024 0832   PROT 8.4 (H) 07/24/2012 1915   ALBUMIN 2.9 (L) 09/01/2024 0832   ALBUMIN 4.0 07/24/2012 1915   AST 31 09/01/2024 0832   ALT 20 09/01/2024 0832   ALT 37 07/24/2012 1915   ALKPHOS 244 (H) 09/01/2024 0832   ALKPHOS 129 07/24/2012 1915   BILITOT 1.1 09/01/2024 0832   GFRNONAA 28 (L) 09/01/2024 0832   GFRNONAA 50 (L) 05/13/2013 1116   GFRAA >60 07/29/2020 1505   GFRAA 58 (L) 05/13/2013 1116      Latest Ref Rng & Units 09/01/2024    8:32 AM 08/27/2024   10:24 AM 08/21/2024   12:33 PM  Hepatic Function  Total Protein 6.5 - 8.1 g/dL 6.6  6.5  6.5   Albumin 3.5 - 5.0 g/dL 2.9  2.9  3.0   AST 15 - 41 U/L 31  33  26   ALT 0 - 44 U/L 20  21  20    Alk Phosphatase 38 - 126 U/L 244  242  220   Total Bilirubin 0.0 - 1.2 mg/dL 1.1  1.4  1.4       Review of Systems   All systems reviewed and negative except where noted in HPI.    Physical Exam  There were no vitals taken for this visit. No LMP for male patient. General:   Alert and oriented. Pleasant and cooperative. Well-nourished and well-developed.  Head:  Normocephalic and atraumatic. Eyes:  Without icterus Ears:  Normal auditory acuity. Neck:  Supple; no masses or thyromegaly. Lungs:  Respirations even and unlabored.  Clear throughout to auscultation.   No wheezes, crackles, or rhonchi. No acute distress. Heart:  Regular rate and rhythm; no murmurs, clicks, rubs, or gallops. Abdomen:  Normal bowel sounds.  No bruits.  Soft, non-tender and non-distended without masses, hepatosplenomegaly or hernias noted.  No guarding or rebound tenderness.  ***Negative Carnett sign.   Rectal:  Deferred.***  Msk:  Symmetrical without gross deformities. Normal posture. Extremities:  Without  edema. Neurologic:  Alert and  oriented x4;  grossly normal neurologically. Skin:  Intact without significant lesions or rashes. Psych:  Alert and cooperative. Normal mood and affect.   Assessment & Plan   Chris George is a 68 y.o. male presenting today with     Grayce Bohr, DNP, AGNP-C [facility]

## 2024-09-23 ENCOUNTER — Ambulatory Visit: Admitting: Family Medicine

## 2024-09-23 ENCOUNTER — Telehealth: Payer: Self-pay | Admitting: Urology

## 2024-09-23 ENCOUNTER — Telehealth: Payer: Self-pay | Admitting: Family Medicine

## 2024-09-23 ENCOUNTER — Telehealth: Payer: Self-pay | Admitting: Oncology

## 2024-09-23 NOTE — Telephone Encounter (Signed)
 Patient has been discharged from [facility] after 20 days and called to see if he needs a follow up appointment. He said it wasn't Cancer related but more heart and fluid. He is asking if he needs to schedule a sooner follow up appointment  with Dr. Jacobo.

## 2024-09-23 NOTE — Care Plan (Signed)
 Transition of Care Encounter Data   Call attempt: 1 Admission date: 09/02/24 Discharge date: 09/22/24 Discharge diagnosis: Right ventricular failure Do you have a hospital follow up appointment?: Yes with Specialty Provider clinic, Yes - Within 14 Days F/U Date: 09/23/24 F/U Provider: Jama Old, MD Patient post discharge: Are you able to make it to your f/u appt.?: Yes Since your discharge, are your symptoms better, worse, or the same?: Better Have you developed any new symptoms?: No Is there someone to help you at home?: Yes  Helper: Other  Are there questions we can help clarify before your next appointment?: No Medications:      Were you able to pick up all of your newly prescribed medications (and/or any necessary refills)?: Yes  Were medications prescribed or ordered upon discharge reviewed today with the most recent outpatient medication list?: Yes Patient was reminded to bring all of their medication bottles to their follow up appointment: Yes If Home Health Services or Medical Equipment was ordered, has it arrived?: N/A Was the patients f/u appt. date/time and location confirmed with the patient?: Yes Remind Patients: If patient has a non-emergent medical problem, they may contact a nurse 24/7 or patient may call their provider's clinic. If experiencing a medical emergency, patient should call 911: Yes .   [facility]: [phone]:  .  [facility]: [phone]:  .  Other: Contact PCP:      Low priority, no response needed unless change in care.   I am reaching out to Chris George as part of the transitional case management team regarding his recent hospital discharge.  Patient advised to follow AVS instructions and bring medications or list of medications to follow up appt's    Next appt with cardiology: 09/23/24   Current Medications[1]  Sharing communication as part of regulatory requirements.           Hadassah KATHEE Core, RN         [1]  Current Outpatient  Medications:  .  allopurinol  (ZYLOPRIM ) 100 MG tablet, Take 0.5 tablets (50 mg total) by mouth daily., Disp: 15 tablet, Rfl: 2 .  atorvastatin  (LIPITOR) 80 MG tablet, Take 1 tablet by mouth once daily, Disp: 90 tablet, Rfl: 3 .  bumetanide  (BUMEX ) 1 MG tablet, Take 1 tablet (1 mg total) by mouth daily. May take an additional 1 mg (1 tablet) in the afternoon if you experience a 3 pound weight gain in 1 day, 5 pound weight gain in 1 week, and/or swelling in your legs, ankles, or abdomen., Disp: 60 tablet, Rfl: 2 .  calcitriol  (ROCALTROL ) 0.25 MCG capsule, Take 1 capsule (0.25 mcg total) by mouth daily., Disp: , Rfl:  .  CALCIUM -MAGNESIUM -ZINC ORAL, Take 1 tablet by mouth in the morning., Disp: , Rfl:  .  colchicine  (COLCRYS ) 0.6 mg tablet, Take 1 tablet (0.6 mg total) by mouth daily as needed (gout flare)., Disp: , Rfl:  .  ELIQUIS  5 mg Tab, Take 1 tablet by mouth twice daily, Disp: 60 tablet, Rfl: 0 .  gabapentin  (NEURONTIN ) 100 MG capsule, Take 2 capsules (200 mg total) by mouth two (2) times a day., Disp: , Rfl:  .  lidocaine -prilocaine  (EMLA ) 2.5-2.5 % cream, Apply topically as needed., Disp: , Rfl:  .  midodrine (PROAMATINE) 5 MG tablet, Take 3 tablets (15 mg total) by mouth Three (3) times a day., Disp: 270 tablet, Rfl: 0 .  MULTIVITAMIN ORAL, Take 1 tablet by mouth daily., Disp: , Rfl:  .  psyllium (METAMUCIL) powder, Take  1 packet by mouth daily., Disp: , Rfl:  .  sodium bicarbonate 650 mg tablet, Take 1 tablet (650 mg total) by mouth two (2) times a day., Disp: , Rfl:  .  tamsulosin  (FLOMAX ) 0.4 mg capsule, Take 1 capsule (0.4 mg total) by mouth daily., Disp: , Rfl:  No current facility-administered medications for this visit.

## 2024-09-23 NOTE — Telephone Encounter (Signed)
 error

## 2024-09-23 NOTE — Telephone Encounter (Signed)
 PT requesting call back to schedule colonoscopy

## 2024-09-23 NOTE — Progress Notes (Signed)
 [facility] Cardiology at [facility] [address] [place], [place] [address]  Phone: [phone] Fax: [phone]  Date of Service: 09/23/2024  Patient Clinic Note  PCP: Referring Provider:  Clinic, [facility] [address] [place] [address] Phone: [phone] Fax: [phone] Jama Old, MD [address] [place],  [place] [address] Phone: [phone] Fax: [phone]    Assessment and Plan:   Chris George is a 68 y.o. 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  Chronic systolic and diastolic heart failure with acute exacerbation History of heart failure with recovered ejection fraction Severe tricuspid regurgitation Severe mitral regurgitation - With recent hospitalization at [facility] from 09/02/2024 until 09/22/2024.  Required inotrope assisted diuresis with dobutamine.  During that hospital stay had TEE and CT tricuspid valve evaluation which showed amenable anatomy to tricuspid and mitral valve intervention.  Unfortunately patient experienced hypotension with the aggressive diuresis and spironolactone was held, midodrine was started in addition to oral Bumex . - Presents today with 1-2+ lower extremity swelling. - Increase Bumex  to 1.5 mg twice daily.  He notes 1 mg barely augments his urine output.  Previously did well on 1.5 mg twice a day. - Denies orthopnea - Follow-up with Dr. Vavalle to discuss tricuspid and mitral valve interventions.  Symptomatically I think he would get more benefit from tricuspid valve intervention given his symptoms of heart failure are typically peripheral edema that is very difficult to manage with diuretic therapy.  Mitral valve intervention  would also likely be beneficial for him down the road if anatomy is amenable - Instructed to decrease midodrine to 2.5 mg 3 times daily.  If he does well on this dose we will continue to proceed with lowest dose possible -Holding spironolactone due to low blood pressures - Holding Jardiance in the setting of recent urologic procedures as well as hypotension  Electrical abnormalities: Permanent atrial fibrillation Junctional bradycardia Right bundle branch block, left axis deviation PVCs -Overall with longstanding atrial fibrillation with history of ablation x 3.  After discussions with his previous cardiologists was felt that atrial fibrillation was likely permanent in nature moving forward. - CVA 04/2023-embolic in nature, patient was not on warfarin or DOAC at the time, likely related to his atrial fibrillation - Continue apixaban  - Not on beta-blockade or calcium  channel blocker due to underlying bradycardia. - with severe biatrial enlargement -Watchman discussion as below, favor holding on this for now.  If another significant GI bleed occurs we can revisit this discussion - Continues to have no indication for pacemaker placement at this time  Chronic kidney disease stage IIIb - Now following with outside nephrology.  Continue current therapies for now.  Stable.  CVA, likely embolic, 03/2023 - Likely due to not being on anticoagulation with permanent atrial fibrillation.  With speech deficits, in the hospital for several days.  Now on apixaban . - Following with neurology  Active colon cancer and prostate cancer Pancytopenia -Cancer being treated at [facility] health.  Currently chemotherapy is on hold due to renal function and medical other comorbidities.  Has a reasonable life expectancy per [facility] hematology/oncology, and would be reasonable to proceed with valve intervention if otherwise indicated  GI bleed 03/2024 - mild but with notable anemia requiring tranfusion x 4  during  hospitalization 03/2024. EGD was unremarkable and colonoscopy was largely unremarkable except for a small angiodysplastic lesion with evidence of stigmata of bleeding in the sigmoid colon - He has been on Eliquis  since then with no recurrence of bleeding and stable hemoglobin levels.  Plan to continue Eliquis  for now - Had discussion in the past regarding possible left atrial appendage occlusion device and have decided against it for the time being.  Overall he is moderate risk for bleeding and high risk for thrombus formation given history of CVA that was likely embolic in nature, current cancer on chemotherapy, and structural heart disease.  I think his intervention would also be elevated risk given his chronic comorbidities including CKD biatrial enlargement and conduction disease.  In addition due to active malignancy he has high risk for device related thrombus.  Goals of care - patient wishes to pursue full regular care moving forward   Lab Results  Component Value Date   CHOL 110 10/22/2023   CHOL 189 04/06/2023   Lab Results  Component Value Date   HDL 59 10/22/2023   HDL 51 04/06/2023   Lab Results  Component Value Date   LDL 44 10/22/2023   LDL 121 (H) 04/06/2023   Lab Results  Component Value Date   VLDL 7.2 (L) 10/22/2023   VLDL 16.8 04/06/2023   Lab Results  Component Value Date   CHOLHDLRATIO 1.9 10/22/2023   CHOLHDLRATIO 3.7 04/06/2023   Lab Results  Component Value Date   TRIG 36 10/22/2023   TRIG 84 04/06/2023    The 10-year ASCVD risk score (Arnett DK, et al., 2019) is: 21.5%   Values used to calculate the score:     Age: 38 years     Clinically relevant sex: Male     Is Non-Hispanic African American: Yes     Diabetic: Yes     Tobacco smoker: No     Systolic Blood Pressure: 115 mmHg     Is BP treated: Yes     HDL Cholesterol: 59 mg/dL     Total Cholesterol: 110 mg/dL  Note: For patients with SBP <90 or >200, Total Cholesterol <130 or >320, HDL <20 or  >100 which are outside of the allowable range, the calculator will use these upper or lower values to calculate the patient's risk score.   Lab Results  Component Value Date   A1C 5.5 09/12/2024     Return in about 1 week (around 09/30/2024).     Subjective:   Chief Complaint: Chief Complaint  Patient presents with  . Follow-up    1 month follow up Chronic systolic and diastolic heart failure with acute exacerbation History of heart failure with recovered ejection fraction,  Electrical abnormalities: Permanent atrial fibrillation, Junctional bradycardia, Right bundle branch block, left axis deviation, PVCs.  Discharged from hospital yesterday for volume overload.  Patient states he feels a lot better today Still has intermittent DOE.  Bilat foot/ankle swelling          Referring Provider: Jama Old, MD  History of Present Illness:   Chris George is a 68 y.o. 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  History of Present Illness   Chris George is a 68 year old male with heart failure  and severe valvular disease who presents for cardiology evaluation.  He recently had a prolonged hospitalization from September 02, 2024, to September 22, 2024, at [facility], where he underwent a transesophageal echocardiogram (TEE) and CT evaluation for potential tricuspid valve intervention or replacement. A cardiac MRI showed a severely dilated right ventricle and severe tricuspid regurgitation with preserved right ventricular function and a left ventricular ejection fraction of 56%. He was aggressively diuresed, including the use of IV dobutamine, and was discharged on Bumex  1 mg daily. His spironolactone was discontinued  due to hypotension, and he intermittently required midodrine.  He feels better but notes some weight gain, which he attributes to a reduction in his Bumex  dosage. Previously, he was on Bumex  1.5 mg twice daily, which he felt was effective in maintaining stable weight. He wants to return to this dosage to manage fluid retention effectively.  He was discharged on midodrine three times a day, which he started taking last night. He notes that his blood pressure tends to drop during sleep, which he believes is normal.  He has been approved for a tricuspid valve intervention and plans to undergo home health rehab before the procedure. He anticipates a consultation in December, followed by the valve procedure, and then resuming cancer treatment. He is optimistic that the valve intervention will improve his energy levels and reduce hospitalizations.  He has a history of difficulty swallowing pills but reports a recent improvement in his ability to take medications without issue. He now manages to take his medications efficiently, which he finds to be a significant improvement in his quality of life.       --------presenting HPI -------- Patient with recent admission to the hospital 04/06/2023 until 6//2024 for CVA.  He was previously followed by cardiology at [facility] clinic, with permanent atrial fibrillation and history of A-fib ablation x 3.  He was not on anticoagulation due to patient preference and unfortunately had likely an embolic CVA.  He has some residual speech deficits and weakness which she is getting therapy for at this point.  Now on apixaban .  Here with his brother today.  Since discharge from the hospital he denies lightheadedness dizziness bleeding chest pain with exertion or shortness of breath.  Overall is working well with physical therapy and trying to get better.   Cardiovascular History & Procedures: Cardiovascular Problems: Atrial fibrillation with underlying junctional rhythm  and slow ventricular response CVA, embolic, in the setting of A-fib and no anticoagulation Right bundle branch block Left axis deviation Heart failure with recovered ejection fraction Moderate-severe mitral regurgitation Moderate tricuspid regurgitation  Cath / PCI: Right heart catheterization 11/27/2023-RA 25, mean PAP 43, pulmonary capillary wedge 22, high cardiac output and index 10.1/4.5  CV Surgery: None  EP Procedures and Devices: Atrial fibrillation ablation x 3 in the past - [facility] -   Non-Invasive Evaluation(s): independently reviewed the most recent study.  Echocardiogram LVEF 55%, impaired relaxation, RV mildly dilated with normal systolic function, moderate to severe mitral valve regurgitation, severe biatrial enlargement Echocardiogram 07/2021-[place]-LVEF 40-45%, moderate MR, severe TR, moderate right ventricular systolic dysfunction Echocardiogram 10/16/2023-LVEF 55%, moderate to severe MR, moderate to severe TR, moderate PI, dilated IVC with no collapsibility.  RV moderately dilated with normal function Echocardiogram 12/2023-negative bubble study, borderline LVEF 50-55%, RV severely dilated with normal systolic function.  Restricted leaflet mobility, moderate to severe TR, moderate pulmonary hypertension    Medical History: Past Medical History:  Diagnosis Date  . Colon cancer    (CMS-HCC)   .  Multiple myeloma (CMS-HCC)   . Prostate cancer    (CMS-HCC)   . Stroke (cerebrum) (CMS-HCC) 04/09/2023    Surgical History: Past Surgical History:  Procedure Laterality Date  . COLONOSCOPY    . PR RIGHT HEART CATH O2 SATURATION & CARDIAC OUTPUT N/A 11/27/2023   Procedure: Right Heart Catheterization;  Surgeon: Gil Franky Barter, MD;  Location: [facility] CATH;  Service: Cardiology    Social History:  reports that he has never smoked. He has never been exposed to tobacco smoke. He has never used smokeless tobacco. He reports that he does not currently use alcohol. He  reports that he does not currently use drugs after having used the following drugs: Marijuana and Cocaine. Retired technical sales engineer - for 20 years    Family History: family history includes Heart murmur in his father and paternal uncle; Hyperlipidemia in his maternal aunt; Hypertension in his maternal aunt.  Review of Systems:  Except as noted in the HPI, the remainder of 10 systems reviewed is negative.   Allergies: No Known Allergies  Medications:  Prior to Admission medications  Medication Dose, Route, Frequency  allopurinol  (ZYLOPRIM ) 100 MG tablet 50 mg, Oral, Daily (standard)  atorvastatin  (LIPITOR) 80 MG tablet 80 mg, Oral, Daily (standard)  bumetanide  (BUMEX ) 1 MG tablet 1 mg, Oral, Daily (standard), May take an additional 1 mg (1 tablet) in the afternoon if you experience a 3 pound weight gain in 1 day, 5 pound weight gain in 1 week, and/or swelling in your legs, ankles, or abdomen.  calcitriol  (ROCALTROL ) 0.25 MCG capsule 0.25 mcg, Daily (standard)  CALCIUM -MAGNESIUM -ZINC ORAL 1 tablet, Daily  colchicine  (COLCRYS ) 0.6 mg tablet 0.6 mg, Daily PRN  ELIQUIS  5 mg Tab 5 mg, Oral  gabapentin  (NEURONTIN ) 100 MG capsule 200 mg, 2 times a day (standard)  lidocaine -prilocaine  (EMLA ) 2.5-2.5 % cream As needed (once a day)  midodrine (PROAMATINE) 5 MG tablet 15 mg, Oral, 3 times a day (standard)  MULTIVITAMIN ORAL 1 tablet, Daily (standard)  psyllium (METAMUCIL) powder 1 packet, Daily (standard)  sodium bicarbonate 650 mg tablet 650 mg, 2 times a day (standard)  tamsulosin  (FLOMAX ) 0.4 mg capsule 0.4 mg, Daily (standard)  empagliflozin (JARDIANCE) 10 mg tablet 10 mg, Oral, Every 24 hours     Objective:   Vitals BP 115/65 (BP Site: L Arm, BP Position: Sitting)   Pulse 72   Wt 88.5 kg (195 lb)   SpO2 95%   BMI 28.80 kg/m    Wt Readings from Last 3 Encounters:  09/23/24 88.5 kg (195 lb)  09/21/24 84.9 kg (187 lb 1.6 oz)  09/02/24 92.5 kg (204 lb)    Physical  Exam General:  Pleasant male sitting in chair in nad.  Neck: Supple, JVP slightly elevated  Resp:   Bilateral lower lung field crackles.   Cardio:  Irregularly irregular, faint SEM over the apex without radiation. Clear S1/S2  Abdomen:   Soft, non-distended, non-tender.  Extremities: Warm well-perfused bilaterally.  1+ lower extremity edema bilaterally  MSK: No joint swelling or erythema. No gross deformities.  Skin: No rashes  Neuro: CN II-XII grossly intact. Strength grossly intact.   Psych: Alert and oriented x3. Appropriate mood.    ECG (09/23/24) - independently interpreted.  None today.  Last in epic September 19, 2024-atrial fibrillation with slow ventricular response, occasional PVC  Most Recent Labs  Lab Results  Component Value Date   NA 138 09/22/2024   K 4.0 09/22/2024   CL 101 09/22/2024   CO2  22.0 09/22/2024   Lab Results  Component Value Date   BUN 86 (H) 09/22/2024   BUN 89 (H) 09/21/2024   BUN 25 (H) 07/26/2012   BUN 30 (H) 07/25/2012   Lab Results  Component Value Date   Creatinine 2.68 (H) 09/22/2024   Creatinine 2.74 (H) 09/21/2024   Creatinine 1.42 (H) 07/26/2012   Creatinine 1.53 (H) 07/25/2012   Lab Results  Component Value Date   PRO-BNP 2,548.0 (H) 09/12/2024   PRO-BNP 3,124.0 (H) 09/02/2024   Lab Results  Component Value Date   Cholesterol, Total 110 10/22/2023   Triglycerides 36 10/22/2023   Cholesterol, HDL 59 10/22/2023   Cholesterol, Non-HDL, Calculated 51 (L) 10/22/2023   Cholesterol, LDL, Calculated 44 10/22/2023

## 2024-09-24 ENCOUNTER — Encounter

## 2024-09-24 ENCOUNTER — Telehealth: Payer: Self-pay

## 2024-09-24 NOTE — Telephone Encounter (Signed)
 Pt contacted office to schedule his colonoscopy.  Please advise when his colonoscopy is due.  Last colonoscopy performed at [facility] during hospitalization for hematochezia 051/13/25 noted the quality of bowel prep was excellent.  The findings indicated a moderate amount of stool was found in the rectum and in the sigmoid colon.  Health maintenance indicates due in May 2028.  Office visit with Grayce for C-Diff on 08/12/24.  Thanks for advising.  Rosaline, CMA

## 2024-09-28 NOTE — Progress Notes (Signed)
 Follow Up Visit   Patient Name: Chris George, male   Patient DOB: [birth year] Date of Service: 09/28/2024  Patient MRN: [id number] Provider Creating Note: Bonnell Sherry, MD  [id number] Primary Care Physician:   [address] [place] [address] Additional Physicians/ Providers:    History of Present Illness Chris George is a 68 y.o. male who is following up today for chronic kidney disease stage IV, anemia of chronic kidney disease with history of smoldering multiple myeloma, secondary hyperparathyroidism.  Patient had recent hospitalization at [facility].  He was admitted from 09/02/2024 till 09/22/2024.  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  He had recent heart failure exacerbation.  He had significant weight gain to 205 pounds.  Current weight 189 pounds.  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  Medications   Current Outpatient Medications:  .  gabapentin  (NEURONTIN ) 100 MG capsule, Take 200 mg by mouth in the morning and 200 mg in the evening., Disp: , Rfl:  .  midodrine (PROAMATINE) 5 MG tablet, Take 15 mg by mouth, Disp: , Rfl:  .  sodium bicarbonate  650 MG tablet, Take 650 mg by mouth in the morning and 650 mg in the evening., Disp: , Rfl:  .  apixaban  (ELIQUIS ) 5 MG tablet, Take 5 mg by mouth, Disp: , Rfl:  .  APPLE CIDER VINEGAR PO, Take by mouth, Disp: , Rfl:  .  atorvastatin  (LIPITOR) 80 MG tablet, Take 80 mg by mouth 1 (one) time each day, Disp: , Rfl:  .  BUMETANIDE  PO, Take 1.5 mg by mouth in the morning and 1.5 mg in the evening., Disp: , Rfl:  .  calcitriol  (Rocaltrol ) 0.25 MCG capsule, Take 1 capsule (0.25 mcg total) by mouth 1 (one) time each day, Disp: 90 capsule, Rfl: 3 .  CALCIUM  MAGNESIUM  ZINC PO, Take 3 tablets by mouth, Disp: , Rfl:  .  colchicine  0.6 MG tablet, Take 0.6 mg by mouth, Disp: , Rfl:  .  lidocaine -prilocaine  (EMLA ) cream, Apply 1 Application topically, Disp: , Rfl:  .  Misc Natural Products (TART CHERRY ADVANCED PO),  Take 60 mL by mouth, Disp: , Rfl:  .  spironolactone (ALDACTONE) 25 MG tablet, Take 1 tablet by mouth in the morning., Disp: , Rfl:  .  tamsulosin  (FLOMAX ) 0.4 MG 24 hr capsule, Take 0.4 mg by mouth, Disp: , Rfl:    Allergies Patient has no known allergies.  Problem List Patient Active Problem List  Diagnosis  . Anemia  . Diabetes mellitus without mention of complication, type II or unspecified type, not stated as uncontrolled (HCC)     Review of Systems  Constitutional:  Negative for chills and fever.  Respiratory:  Negative for cough and shortness of breath.   Cardiovascular:  Negative for chest pain and palpitations.  Gastrointestinal:  Negative for nausea and vomiting.  Genitourinary:  Negative for dysuria, hematuria and urgency.     History Past Medical History:  Diagnosis Date  . Anemia   . Atrial fibrillation (HCC)   . Congestive heart failure (HCC)   . Diabetes mellitus without mention of complication, type II or unspecified type, not stated as uncontrolled (HCC)   . Other and unspecified hyperlipidemia   . Other malignant neoplasm of unspecified site (HCC)   . Sleep apnea     Past Surgical History:  Procedure Laterality Date  . ATRIAL ABLATION SURGERY    . CARDIAC SURGERY    . COLECTOMY PARTIAL / TOTAL    .  ESOPHAGOGASTRODUODENOSCOPY    . PORTACATH PLACEMENT     Family History  Problem Relation Age of Onset  . Heart disease Father   . Cancer Father   . Cancer Sister   . Kidney disease Sister   . Hypertension Sister   . Diabetes Sister    Social History   Tobacco Use  . Smoking status: Never  . Smokeless tobacco: Never  Substance Use Topics  . Alcohol use: Not Currently        Physical Exam  Vitals BP 93/63 (BP Location: Right upper arm, Patient Position: Sitting)   Pulse 59   Temp 98.2 F   Wt 189 lb (85.7 kg)   SpO2 100%   BMI 27.91 kg/m   PHYSICAL EXAM: General appearance: well developed, well nourished, NAD Eyes: anicteric  sclerae, moist conjunctivae; no lid-lag  HENT: Atraumatic; hearing intact Neck: Trachea midline; supple Lungs: CTAB, with normal respiratory effort  CV: S1S2, irregular Abdomen: Soft, non-tender; bowel sounds present Extremities: 2+ bilateral lower extremity edema Skin: Warm and dry, normal skin turgor, no rashes noted. Psych: Appropriate affect, alert and oriented to person, place and time    Laboratory Studies  Chemistry  Lab Units 09/22/24 0458 09/21/24 1821 09/21/24 9166 09/20/24 1657 09/20/24 0859 09/19/24 1618 09/14/24 1642 09/14/24 0338 09/12/24 1508 09/12/24 0417 09/08/24 0703 09/07/24 1208 09/03/24 9375 09/02/24 1424 06/22/24 1206 02/17/24 0955 12/23/23 9071 12/23/23 0928 12/18/23 1151 12/13/23 0700 12/12/23 0649 11/20/23 0620 11/19/23 1646 10/22/23 0958 05/21/23 0906 04/07/23 0514 04/06/23 0432 04/05/23 1351 12/17/22 1422 12/17/22 1422  SODIUM mmol/L 138 135 138 136 137 136   < > 139   < > 139   < > 137   < > 143 137 135   < > 132* 133* 133* 131*   < > 141   < > 138   < > 137 139  --  140  POTASSIUM mmol/L 4.0 4.7 4.2 4.1 4.2 4.3   < > 4.2   < > 4.3   < > 4.1   < > 4.4 5.0 3.8   < > 4.8 4.6 3.9 4.1   < > 4.4   < > 4.4   < > 3.8 3.7  --  4.3  CHLORIDE mmol/L 101 98 100 99 100 99   < > 94*   < > 94*   < > 93*   < > 99 104 99   < > 95* 97 93* 93*   < > 101   < > 104   < > 105 103  --  100  CO2 mmol/L 22.0 20.0 22.0 21.0 23.0 24.0   < > 31.0   < > 32.0*   < > 31.4*   < > 31.9* 23 25   < > 27 28.2 26.0 27.0   < > 27.2   < > 30.9   < > 28.0 30.0  --  35.7*  ANION GAP mmol/L 15* 17* 16* 16* 14 13   < > 14   < > 13   < > 13   < > 12  --   --   --   --   --  14 11   < > 13   < >  --    < > 4* 6   < >  --   MAGNESIUM  mg/dL 1.8 1.9 2.0 1.9 2.1 2.0   < > 2.7*   < > 1.9   < >  --    < >  1.5*  --   --   --   --   --  1.8 1.8   < >  --   --   --   --  1.7  --   --   --   CALCIUM  mg/dL 8.7 8.8 8.9 8.9 9.0 9.0   < > 8.6*   < > 8.0*   < > 8.7   < > 8.8 8.6 8.9   < > 9.5 9.8  9.6 9.7   < > 8.9   < > 8.8   < > 9.4 9.2  --  9.6  PHOSPHORUS mg/dL  --   --   --   --   --   --   --  5.2*  --   --   --   --   --   --  4.3 4.4*  --  3.9  --  4.8 4.7   < >  --   --   --   --  2.4  --   --   --   ALK PHOS U/L  --   --   --   --   --   --   --   --   --   --   --  262*  --  277*  --   --   --   --  284*  --   --   --  262*  --  196*  --   --  151*  --  198*  PTH pg/mL  --   --   --   --   --   --   --   --   --   --   --   --   --   --  320* 212*  --  139*  --   --   --   --   --   --   --   --   --   --   --   --   GLUCOSE mg/dL 846 674* 851 770* 875 862   < > 113   < > 107   < > 133   < > 89 121* 131*   < > 130* 115* 108 105   < > 151   < > 139*   < > 127 140  --  142*  ALBUMIN g/dL  --   --   --   --   --   --   --   --   --   --   --  2.9*  --  3.1* 4.0 3.8  --  3.7 3.8  --   --   --  3.0*  --  3.7  --   --  3.7  --  4.1  BUN mg/dL 86* 89* 81* 78* 75* 74*   < > 78*   < > 82*   < > 68*   < > 48* 63* 76*   < > 73* 73* 76* 76*   < > 38*   < > 27*   < > 17 16  --  21  CREATININE mg/dL 7.31* 7.25* 7.19* 7.02* 3.02* 3.02*   < > 2.95*   < > 2.67*   < > 2.82*   < > 2.35* 3.26* 3.08*   < > 2.97* 2.9* 2.83* 2.85*   < > 2.22*   < > 1.7*   < > 1.30* 1.44*  --  1.6*  HEMOGLOBIN A1C %  --   --   --   --   --   --   --   --   --  5.5  --   --   --   --   --   --   --   --   --   --   --   --   --   --   --   --  6.9*  --   --  9.8*   < > = values in this interval not displayed.    Iron Studies  Lab Units 11/20/23 0620  FERRITIN ng/mL 413.1*    CBC  Lab Units 06/22/24 1206 02/17/24 0955 12/23/23 0928  WBC AUTO Thousand/uL 6.5 2.4* 4.3  HEMOGLOBIN g/dL 7.5* 5.7* 7.9*  HEMATOCRIT % 23.5* 18.3* 25.4*  MCV fL 91.4 92.0 88.8  PLATELETS AUTO Thousand/uL 113* 114* 179     Urine  Lab Units 06/22/24 1206 06/09/24 1532 02/17/24 1043 12/23/23 0928 09/25/23 1455 05/21/23 0906 12/17/22 1422  PROT/CREAT RATIO UR mg/g creat  --   --   --  0.123  123 0.159*  159*  --   --   ALB MG/G  CREAT UR mg/g creat 15 29.8 8  --   --  62.3* 13.2    Lab Results  Component Value Date   PTH 320 (H) 06/22/2024   CALCIUM  8.7 09/22/2024   PHOS 5.2 (H) 09/14/2024     Imaging and Other Studies     Orders Placed This Encounter  . CBC and Differential  . Renal Function Panel  . PTH, Intact  . Urine Albumin / Creatinine Ratio       Impression/Recommendations  Chris George is a 68 y.o. male with  past medical history of smoldering multiple myeloma, colon cancer, atrial fibrillation, history of diabetes mellitus type 2, hyperlipidemia, obstructive sleep apnea, chronic systolic heart failure ejection fraction 40 to 45% who was referred the evaluation of chronic kidney disease stage IIIa.  1.  Chronic kidney disease stage IV.  Patient continues to have advanced renal dysfunction.  Most recent eGFR was 25.  We plan to repeat renal parameters today.  Jardiance currently on hold.  2.  Anemia of chronic kidney disease/smoldering multiple myeloma.  Repeat CBC today.  Has required transfusions in the past.  Defer decisions regarding erythropoietin stimulating agents to the primary oncology team.  3.  Secondary hyperparathyroidism.  Repeat PTH, phosphorus, calcium  level.  Maintain the patient on calcitriol .  4.  Lower extremity edema/chronic systolic and diastolic heart failure..  Currently maintained on bumetanide .  Spironolactone currently on hold.  He will continue to follow with cardiology.  Return in about 10 weeks (around 12/07/2024).   Munsoor Lateef, MD

## 2024-09-29 ENCOUNTER — Ambulatory Visit: Admitting: Oncology

## 2024-09-29 ENCOUNTER — Other Ambulatory Visit

## 2024-09-29 ENCOUNTER — Ambulatory Visit

## 2024-09-29 NOTE — Progress Notes (Signed)
 Discharge Plan Member has been discharged from [facility]. Please see below Discharge Plan for details.  If you have any questions, please contact our Clinical Team at [phone].  Name: Chris George Date of Birth: May 31, 1956 Discharge Date: 2024-09-29 Discharged Reason No longer eligible: Contract with Provider Terminated Referring Provider: Dr. Jacobo Psychiatric Medication Recommendation Transition N/A (no CC med recs made) Discharge Summary:  Care Delivered: Member attended multiple [facility] and Coach Visits over 13 months. Member's PHQ-9 improved from a 7 in October 2024 to a 2 in June 2025. Member's quality of life screener shows a decline of 20% from August 2025 to October 2025. Current interventions / session focus: Relying on his faith to help ease feelings of worry Using perspective to stay hopeful Visualizing a future where he can be of service to others Practical support to reduce monthly bills Current Goals: Home health rehab prior to valve replacement Regain strength to continue with chemo tx  Additional details: He recently had a prolonged hospitalization from September 02, 2024, to September 22, 2024, at [facility], where he underwent a transesophageal echocardiogram (TEE) and CT evaluation for potential tricuspid valve intervention or replacement. He has been approved for a tricuspid valve intervention and plans to undergo home health rehab before the procedure. He anticipates a consultation in December, followed by the valve procedure, and then resuming cancer treatment. He is optimistic that the valve intervention will improve his energy levels and reduce hospitalizations. Should referring organization contact?: Member is interested in additional support, receiving local behavioral health support  [place] Care Provider: Kedric Severin - Health Coach

## 2024-10-06 ENCOUNTER — Ambulatory Visit: Admitting: Urology

## 2024-10-06 ENCOUNTER — Ambulatory Visit

## 2024-10-06 ENCOUNTER — Other Ambulatory Visit

## 2024-10-06 ENCOUNTER — Ambulatory Visit: Admitting: Oncology

## 2024-10-06 VITALS — BP 93/59 | HR 98 | Wt 190.0 lb

## 2024-10-06 DIAGNOSIS — Z792 Long term (current) use of antibiotics: Secondary | ICD-10-CM | POA: Diagnosis not present

## 2024-10-06 DIAGNOSIS — C61 Malignant neoplasm of prostate: Secondary | ICD-10-CM

## 2024-10-06 MED ORDER — LIDOCAINE HCL URETHRAL/MUCOSAL 2 % EX GEL
1.0000 | Freq: Once | CUTANEOUS | Status: AC
  Administered 2024-10-06: 1 via URETHRAL

## 2024-10-06 MED ORDER — CEPHALEXIN 500 MG PO CAPS
500.0000 mg | ORAL_CAPSULE | Freq: Once | ORAL | Status: AC
  Administered 2024-10-06: 500 mg via ORAL

## 2024-10-06 MED ORDER — TAMSULOSIN HCL 0.4 MG PO CAPS: 0.4000 mg | ORAL_CAPSULE | Freq: Two times a day (BID) | ORAL | 7 refills | Status: AC

## 2024-10-06 NOTE — Patient Instructions (Signed)
 Nocturia refers to the need to wake up during the night to urinate, which can disrupt your sleep and impact your overall well-being. Fortunately, there are several strategies you can employ to help prevent or manage nocturia. It's important to consult with your healthcare provider before making any significant changes to your routine. Here are some helpful strategies to consider:  Limit Fluid Intake Before Bed: Avoid drinking large amounts of fluids in the evening, especially within a few hours of bedtime. Consume most of your daily fluid intake earlier in the day to reduce the need to urinate at night.  Monitor Your Diet: Limit your intake of caffeine and alcohol, as these substances can increase urine production and irritate the bladder.  Avoid diet, zero calorie, and artificially sweetened drinks, especially sodas, in the afternoon or evening. Be mindful of consuming foods and drinks with high water  content before bedtime, such as watermelon and herbal teas.  Time Your Medications: If you're taking medications that contribute to increased urination, consult your healthcare provider about adjusting the timing of these medications to minimize their impact during the night.  Practice Double Voiding: Before going to bed, make an effort to empty your bladder twice within a short period. This can help reduce the amount of urine left in your bladder before sleep.  Bladder Training: Gradually increase the time between bathroom visits during the day to train your bladder to hold larger volumes of urine. Over time, this can help reduce the frequency of nighttime awakenings to urinate.  Elevate Your Legs During the Day: Elevating your legs during the day can help minimize fluid retention in your lower extremities, which might reduce nighttime urination.  Pelvic Floor Exercises: Strengthening your pelvic floor muscles through Kegel exercises can help improve bladder control and potentially reduce  the urge to urinate at night.  Create a Relaxing Bedtime Routine: Stress and anxiety can exacerbate nocturia. Engage in calming activities before bed, such as reading, listening to soothing music, or practicing relaxation techniques.  Stay Active: Engage in regular physical activity, but avoid intense exercise close to bedtime, as this can increase your body's demand for fluids.  Maintain a Healthy Weight: Excess weight can compress the bladder and contribute to bladder and urinary issues. Aim to achieve and maintain a healthy weight through a balanced diet and regular exercise.  Remember that every individual is unique, and the effectiveness of these strategies may vary. It's important to work with your healthcare provider to develop a plan that suits your specific needs and addresses any underlying causes of nocturia.

## 2024-10-06 NOTE — Progress Notes (Signed)
 Cystoscopy Procedure Note:  Indication: Urinary symptoms, nocturia  After informed consent and discussion of the procedure and its risks, Chris George was positioned and prepped in the standard fashion. Cystoscopy was performed with a flexible cystoscope.  There was some subtle narrowing in the proximal urethra that was easily bypassed with the cystoscope.  The urethra, bladder neck and entire bladder was visualized in a standard fashion. The prostate was moderate in size. The ureteral orifices were visualized in their normal location and orientation.  Mild trabeculations, no suspicious lesions.  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  Findings: Mild bladder trabeculations, mild catheter cystitis at bladder neck  -------------------------------------------------------------  Assessment and Plan: Multiple comorbidities including history of stroke, heart failure, stage III colon cancer, CKD stage IV, prostate cancer treated with radiation, spontaneous scrotal hematoma.  Reports worsening urinary symptoms with some frequency during the day but primary bother is nocturia 3-6 times overnight.  He is on single dose Flomax  currently.  He does report increased lower extremity swelling, currently undergoing evaluation for possible tricuspid valve replacement which they feel could be contributing to his lower extremity edema  I think we need to have realistic expectations, recommended minimizing fluids prior to bedtime, lower extremity elevation, compression socks, increasing the Flomax  dose.  Suspect lower extremity edema is the primary etiology of his nocturia.  Radiation may also be contributing.  Previously no significant improvement on beta 3 agonist Gemtesa .  Flomax  increased to twice daily Nocturia strategies reviewed extensively Follow-up late January/early February 2026 after tricuspid valve replacement for PVR and symptom check  Chris Burnet, MD 10/06/2024

## 2024-10-07 ENCOUNTER — Other Ambulatory Visit: Payer: Self-pay

## 2024-10-08 ENCOUNTER — Other Ambulatory Visit

## 2024-10-13 ENCOUNTER — Ambulatory Visit: Admitting: Oncology

## 2024-10-13 ENCOUNTER — Other Ambulatory Visit

## 2024-10-13 ENCOUNTER — Ambulatory Visit

## 2024-10-13 NOTE — Progress Notes (Signed)
 ------------------------------------------------------------------------------- Attestation signed by Vavalle, John Paul, MD at 10/14/24 1753 Attending Physician Attestation:   I personally evaluated and examined the patient, formulated the treatment plan, and reviewed this note with the resident/fellow. I agree with the findings, assessment, and plan as documented by the resident/fellow.  Chris SHAUNNA Grumbling, MD, MHS.  -------------------------------------------------------------------------------  [facility] Valve Clinic Note  Date of Service:10/14/2024  Patient Clinic Note  PCP: Referring Provider:  Clinic, Chris George [address] [place] [address] Phone: [phone] Fax: [phone] Chris Old, MD [address] [place],  [place] [address] Phone: [phone] Fax: [phone]   Assessment and Plan:  [name] is a 68 y.o. 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  Severe tricuspid regurgitation Severe mitral regurgitation -Continue with Bumex  3 mg twice daily -has been holding spironolactone and jardiance in light of hypotension -Plan for Mitral TEER first, followed by likely tricuspid TEER, if he qualifies for CLASP II TR after treating his mitral regurgitation. He is scheduled for m-TEER 10/22/24.  Permanent atrial fibrillation -c/w eliquis  -had GI bleed 03/2024 requiring blood transfusion  Active colon cancer and prostate cancer Pancytopenia -Cancer being treated at [name].  Currently chemotherapy is on hold due to renal function and medical other comorbidities.  Has a reasonable life expectancy per [name], and would be reasonable to proceed with valve  intervention if otherwise indicated   Chris MYRTIS Grumbling, MD, MHS Associate Professor of Medicine [facility] [employer]  [place], [place]  Lab Results  Component Value Date   CHOL 110 10/22/2023   CHOL 189 04/06/2023   Lab Results  Component Value Date   HDL 59 10/22/2023   HDL 51 04/06/2023   Lab Results  Component Value Date   LDL 44 10/22/2023   LDL 121 (H) 04/06/2023   Lab Results  Component Value Date   VLDL 7.2 (L) 10/22/2023   VLDL 16.8 04/06/2023   Lab Results  Component Value Date   CHOLHDLRATIO 1.9 10/22/2023   CHOLHDLRATIO 3.7 04/06/2023   Lab Results  Component Value Date   TRIG 36 10/22/2023   TRIG 84 04/06/2023   Lab Results  Component Value Date   A1C 5.5 09/12/2024   Subjective:  History of Present Illness:  Chris George is a 68 year George male with heart failure with recovered ejection fraction, severe tricuspid regurgitation, and severe mitral regurgitation who presents for cardiology evaluation.  He has experienced a weight decrease from 195 pounds to 188 pounds since the last visit. He is currently taking Bumex  1.5 mg twice daily. He reports no issues when lying flat to sleep.  He mentions a shoulder problem that began two days ago, which he attributes to sleeping in an awkward position. He visited his primary care physician earlier today and was prescribed a muscle relaxer and advised to apply heat to the area.  He has a history of gout and recently experienced a flare-up. He started colchicine  yesterday and reports feeling better today. He plans to return to allopurinol  after the flare-up subsides.  He mentions frequent urination, which disrupts his sleep, and is scheduled for a cystoscopy on December 2nd to investigate this issue further. He is also taking 5 mg oxycodone , half  a tablet, for pain management related to his cancer treatment.  He is scheduled to start physical therapy at home tomorrow  to address strength issues. He is also managing a lipoma on his back, which he describes as a 'little fat blob'.  Patient with recent admission to the hospital 04/06/2023 until 6//2024 for CVA.  He was previously followed by cardiology at [name], with permanent atrial fibrillation and history of A-fib ablation x 3.  He was not on anticoagulation due to patient preference and unfortunately had likely an embolic CVA.  He has some residual speech deficits and weakness which she is getting therapy for at this point.  Now on apixaban .  Here with his brother today.  Since discharge from the hospital he denies lightheadedness dizziness bleeding chest pain with exertion or shortness of breath.  Overall is working well with physical therapy and trying to get better.   Cardiovascular History & Procedures: Cardiovascular Problems: Atrial fibrillation with underlying junctional rhythm and slow ventricular response CVA, embolic, in the setting of A-fib and no anticoagulation Right bundle branch block Left axis deviation Heart failure with recovered ejection fraction Moderate-severe mitral regurgitation Moderate tricuspid regurgitation  Cath / PCI: Right heart catheterization 11/27/2023-RA 25, mean PAP 43, pulmonary capillary wedge 22, high cardiac output and index 10.1/4.5  CV Surgery: None  EP Procedures and Devices: Atrial fibrillation ablation x 3 in the past - [facility] -   Non-Invasive Evaluation(s): independently reviewed the most recent study.  Echocardiogram LVEF 55%, impaired relaxation, RV mildly dilated with normal systolic function, moderate to severe mitral valve regurgitation, severe biatrial enlargement Echocardiogram 07/2021-[place]-LVEF 40-45%, moderate MR, severe TR, moderate right ventricular systolic dysfunction Echocardiogram 10/16/2023-LVEF 55%, moderate to severe MR, moderate to severe TR, moderate PI, dilated IVC with no collapsibility.  RV moderately dilated with normal  function Echocardiogram 12/2023-negative bubble study, borderline LVEF 50-55%, RV severely dilated with normal systolic function.  Restricted leaflet mobility, moderate to severe TR, moderate pulmonary hypertension  Medical History: Past Medical History:  Diagnosis Date   Colon cancer    (CMS-HCC)    Multiple myeloma (CMS-HCC)    Prostate cancer    (CMS-HCC)    Stroke (cerebrum) (CMS-HCC) 04/09/2023   Surgical History: Past Surgical History:  Procedure Laterality Date   COLONOSCOPY     PR RIGHT HEART CATH O2 SATURATION & CARDIAC OUTPUT N/A 11/27/2023   Procedure: Right Heart Catheterization;  Surgeon: Gil Franky Barter, MD;  Location: [facility] CATH;  Service: Cardiology    Social History:  reports that he has never smoked. He has never been exposed to tobacco smoke. He has never used smokeless tobacco. He reports that he does not currently use alcohol. He reports that he does not currently use drugs after having used the following drugs: Marijuana and Cocaine. Retired technical sales engineer - for 20 years   Family History: family history includes Heart murmur in his father and paternal uncle; Hyperlipidemia in his maternal aunt; Hypertension in his maternal aunt.  Review of Systems:  Except as noted in the HPI, the remainder of 10 systems reviewed is negative.   Allergies: No Known Allergies  Medications:  Prior to Admission medications  Medication Dose, Route, Frequency  allopurinol  (ZYLOPRIM ) 100 MG tablet 50 mg, Oral, Daily (standard)  atorvastatin  (LIPITOR) 80 MG tablet 80 mg, Oral, Daily (standard)  calcitriol  (ROCALTROL ) 0.25 MCG capsule 0.25 mcg, Daily (standard)  CALCIUM -MAGNESIUM -ZINC ORAL 1 tablet, Daily  colchicine  (COLCRYS ) 0.6 mg tablet 0.6 mg, Daily PRN  ELIQUIS  5  mg Tab 5 mg, Oral  gabapentin  (NEURONTIN ) 100 MG capsule 200 mg, 2 times a day (standard)  lidocaine -prilocaine  (EMLA ) 2.5-2.5 % cream As needed (once a day)  midodrine (PROAMATINE) 5 MG tablet 15  mg, Oral, 3 times a day (standard)  MULTIVITAMIN ORAL 1 tablet, Daily (standard)  psyllium (METAMUCIL) powder 1 packet, Daily (standard)  sodium bicarbonate  650 mg tablet 650 mg, 2 times a day (standard)  tamsulosin  (FLOMAX ) 0.4 mg capsule 0.4 mg, Daily (standard)  bumetanide  (BUMEX ) 1 MG tablet 1.5 mg, Oral, 2 times a day (standard)  empagliflozin (JARDIANCE) 10 mg tablet 10 mg, Oral, Every 24 hours    Objective:   Vitals BP 97/46 (BP Site: R Arm, BP Position: Sitting, BP Cuff Size: Large)   Pulse 51   Wt 85.4 kg (188 lb 4.8 oz)   SpO2 98%   BMI 27.81 kg/m    Wt Readings from Last 3 Encounters:  09/29/24 85.4 kg (188 lb 4.8 oz)  09/23/24 88.5 kg (195 lb)  09/21/24 84.9 kg (187 lb 1.6 oz)   Physical Exam General:  Pleasant male sitting in chair in nad.  Neck: Supple, JVP normal no with systolic pulsatility noted  Resp:   Clear bilaterally with good air movement.  Cardio:  Irregularly irregular, faint SEM over the apex without radiation. Clear S1/S2  Abdomen:   Soft, non-distended, non-tender.  Extremities: Warm well-perfused bilaterally.  1+-2+ lower extremity edema bilaterally  MSK: No joint swelling or erythema. No gross deformities.  Skin: No rashes  Neuro: CN II-XII grossly intact. Strength grossly intact.   Psych: Alert and oriented x3. Appropriate mood.    ECG (09/29/24) - independently interpreted.  None today.  Last in epic September 19, 2024-atrial fibrillation with slow ventricular response, occasional PVC  Most Recent Labs  Lab Results  Component Value Date   NA 138 09/22/2024   K 4.0 09/22/2024   CL 101 09/22/2024   CO2 22.0 09/22/2024   Lab Results  Component Value Date   BUN 86 (H) 09/22/2024   BUN 89 (H) 09/21/2024   BUN 25 (H) 07/26/2012   BUN 30 (H) 07/25/2012   Lab Results  Component Value Date   Creatinine 2.68 (H) 09/22/2024   Creatinine 2.74 (H) 09/21/2024   Creatinine 1.42 (H) 07/26/2012   Creatinine 1.53 (H) 07/25/2012   Lab Results   Component Value Date   PRO-BNP 2,548.0 (H) 09/12/2024   PRO-BNP 3,124.0 (H) 09/02/2024   Lab Results  Component Value Date   Cholesterol, Total 110 10/22/2023   Triglycerides 36 10/22/2023   Cholesterol, HDL 59 10/22/2023   Cholesterol, Non-HDL, Calculated 51 (L) 10/22/2023   Cholesterol, LDL, Calculated 44 10/22/2023

## 2024-10-14 ENCOUNTER — Inpatient Hospital Stay

## 2024-10-14 NOTE — Progress Notes (Addendum)
 ------------------------------------------------------------------------------- Attestation signed by Noreene Horris Miguel, MD at 10/15/24 [phone] with severe TR and severe MR.  Comorbidities include recurrent colon cancer recently on chemotherapy and localized prostate cancer sp XRT in 2024, multiple myeloma, renal disease, afib.  I think he is high risk for open approach. Favor transcatheter therapies.  I have seen and evaluated this patient, as well as confirmed the history and physical exam findings.  I have reviewed the relevant lab and diagnostic studies and participated in the evaluation and management decisions for the patient.  I concur with the documentation as outlined.  Horris Noreene, MD  Assistant Professor Cardiac Surgery Pager: [phone]  -------------------------------------------------------------------------------  Cardiac Surgery Consult Note  Requesting Attending Physician :  Noreene Horris Holland* Consulting Physician: Dr. Horris Noreene Chief Complaint:  severe mitral and tricuspid regurgitation   History of Present Illness:  Chris George is a 68 y.o. male with pmhx of  recurrent colon cancer with liver metastases (on chemo holiday currently), localized prostate cancer s/p XRT 05/2023, multiple myeloma initially diagnosed in 2013, chronic anemia, neutropenia, and thrombocytopenia, stage 3 CKD (GFR 20-30), peripheral neuropathy, right heart failure and history of systolic heart failure with recovered EF (40% --> 55%), atrial fibrillation s/p ablation x 3 on eliquis , ischemic CVA 04/05/23   Was recently hospitalized for acute on chronic heart failure exacerbation at which time he was placed on dobutamine with IV diuresis. During hospitalization, structural cardiology team was consulted for valvular heart disease. TEE was performed which showed severe tricuspid and mitral valve regurgitation and he was recommended to follow up in valve clinic to discuss further.    Reports ongoing and progressively worsening dyspnea on exertion.  Reports generalized fatigue and LE edema. States he can only walk a short distance before he has to sit down and rest.     Allergies: Patient has no known allergies.  Scheduled Meds: Current Medications[1]  Medical History: Past Medical History[2]  Surgical History: Past Surgical History[3]  Social History: Tobacco Use History[4] Social History   Substance and Sexual Activity  Alcohol Use Not Currently   Comment: not in over 20 years per pt   Social History   Substance and Sexual Activity  Drug Use Not Currently   Types: Marijuana, Cocaine   Comment: not in over 20 years per pt    Family History: The patient's family history includes Heart murmur in his father and paternal uncle; Hyperlipidemia in his maternal aunt; Hypertension in his maternal aunt..  Review of Systems: A 12 system review of systems was negative except as noted in HPI.  Physical Exam: There were no vitals filed for this visit. General: alert and oriented, resting comfortably in NAD HEENT: normocephalic, atraumatic. sclera anicteric, MMM, Dentition is Good   Neck: Supple. Pulmonary: non-labored breathing, lungs CTAB, No wheezes, rales or rhonchi.  CV: regular rate and rhythm. Normal S1, S2. 2+ DP and radial pulses bilaterally.  Abdomen/GI: soft, non-tender, non-distended. positive bowel sounds throughout abdomen. no rebound or guarding present. Neurologic: A&O x 3, answering questions appropriately. strength equal in upper & lower extremities bilaterally. sensation intact throughout. no facial droop noted. Musculoskeletal: extremities warm and well perfused.  Skin: warm and dry. No rashes.   Diagnostic Studies: Data Review:  All lab results last 24 hours:  No results found for this or any previous visit (from the past 24 hours).  MRI Cardiac 09/15/2024 1.  Severely dilated right ventricle (index RVEDV = 239 mL/sq m) and right  atrium with severe tricuspid  valve regurgitation, mild RV hypertrophy, systolic septal flattening and RV insertion site enhancement/fibrosis. Findings compatible with chronic tricuspid valve regurgitation and pulmonary hypertension. Adaptive preserved RV systolic function (RVEF = 51%) with supranormal stroke volume (RVSV = 241 mL ).  2.  Less severe dilation of the left ventricle with moderate to severe mitral regurgitation, adaptive normal systolic function (LVEF = 56%) with supranormal stroke volume (LVSV= 171 mL) and subepicardial enhancement/fibrosis in the mid inferior and inferolateral wall. Favor sequela of prior myocarditis.  3.  Enlarged pulmonary artery measuring 4.0 cm, compatible with pulmonary hypertension.  4.  No evidence of infiltrative disease such as amyloidosis.       Echocardiogram 09/11/2024   1. The left ventricular systolic function is normal, LVEF is visually estimated at > 55%.   2. There is severe centrally directed mitral regurgitation.   3. MR Effective regurgitant orifice and volume by PISA are  0.59 cm2 and  79 ml respectively.   4. The right ventricle is mildly dilated in size, with moderately reduced systolic function.   5. There is severe tricuspid regurgitation.   6. There is moderate pulmonary hypertension.   7. TR maximum velocity: 3.6 m/s  Estimated PASP: 60 mmHg.   Cardiac CT TAVR 09/10/24: Severe biatrial enlargement; dilated right ventricle with low normal systolic function; TV leaflets are not well visualized but appear with normal thickness and mobility; MV leaflets appear normal in thickness with normal mobility and no notable mitral annular calcification. Structural heart team consulted, appears no plan for inpatient intervention at this time    Assessment/Plan:  Chris George is a very pleasant 68 y.o. male with past medical history as above that is being seen in evaluation of severe mitral and tricuspid regurgitation.   Based on current symptoms  and diagnostic findings, patient is high risk for surgery and transcutaneous options should be considered. Further recommendations per cardiology team.     Dr. Noreene was present for the clinic visit and endorses the plan of care.   Odella Powers PA-C      [1] Current Outpatient Medications  Medication Sig Dispense Refill   allopurinol  (ZYLOPRIM ) 100 MG tablet Take 0.5 tablets (50 mg total) by mouth daily. 15 tablet 2   atorvastatin  (LIPITOR) 80 MG tablet Take 1 tablet by mouth once daily 90 tablet 3   bumetanide  (BUMEX ) 1 MG tablet Take 3 tablets (3 mg total) by mouth two (2) times a day. 540 tablet 3   calcitriol  (ROCALTROL ) 0.25 MCG capsule Take 1 capsule (0.25 mcg total) by mouth daily.     CALCIUM -MAGNESIUM -ZINC ORAL Take 1 tablet by mouth in the morning.     colchicine  (COLCRYS ) 0.6 mg tablet Take 1 tablet (0.6 mg total) by mouth daily as needed (gout flare).     ELIQUIS  5 mg Tab Take 1 tablet by mouth twice daily 60 tablet 0   gabapentin  (NEURONTIN ) 100 MG capsule Take 2 capsules (200 mg total) by mouth two (2) times a day.     lidocaine -prilocaine  (EMLA ) 2.5-2.5 % cream Apply topically as needed.     midodrine (PROAMATINE) 5 MG tablet Take 3 tablets (15 mg total) by mouth Three (3) times a day. 270 tablet 0   MULTIVITAMIN ORAL Take 1 tablet by mouth daily.     psyllium (METAMUCIL) powder Take 1 packet by mouth daily.     sodium bicarbonate  650 mg tablet Take 1 tablet (650 mg total) by mouth two (2) times a day.  tamsulosin  (FLOMAX ) 0.4 mg capsule Take 1 capsule (0.4 mg total) by mouth daily.     valACYclovir (VALTREX) 1000 MG tablet Take 1 tablet (1,000 mg total) by mouth daily.     No current facility-administered medications for this visit.  [2] Past Medical History: Diagnosis Date   Colon cancer    (CMS-HCC)    Multiple myeloma (CMS-HCC)    Prostate cancer    (CMS-HCC)    Stroke (cerebrum) (CMS-HCC) 04/09/2023  [3] Past Surgical  History: Procedure Laterality Date   COLONOSCOPY     PR RIGHT HEART CATH O2 SATURATION & CARDIAC OUTPUT N/A 11/27/2023   Procedure: Right Heart Catheterization;  Surgeon: Gil Franky Barter, MD;  Location: [facility] CATH;  Service: Cardiology  [4] Social History Tobacco Use  Smoking Status Never   Passive exposure: Never  Smokeless Tobacco Never

## 2024-10-15 ENCOUNTER — Inpatient Hospital Stay: Attending: Oncology

## 2024-10-15 ENCOUNTER — Ambulatory Visit
Admission: RE | Admit: 2024-10-15 | Discharge: 2024-10-15 | Disposition: A | Discharge status: Home / Self Care | Source: Ambulatory Visit | Attending: Radiation Oncology | Admitting: Radiation Oncology

## 2024-10-15 ENCOUNTER — Other Ambulatory Visit: Payer: Self-pay | Admitting: Lab

## 2024-10-15 ENCOUNTER — Encounter

## 2024-10-15 ENCOUNTER — Encounter: Payer: Self-pay | Admitting: Radiation Oncology

## 2024-10-15 ENCOUNTER — Other Ambulatory Visit: Payer: Self-pay | Admitting: *Deleted

## 2024-10-15 VITALS — BP 100/63 | HR 56 | Temp 97.6°F | Resp 20 | Wt 205.0 lb

## 2024-10-15 DIAGNOSIS — C786 Secondary malignant neoplasm of retroperitoneum and peritoneum: Secondary | ICD-10-CM | POA: Insufficient documentation

## 2024-10-15 DIAGNOSIS — Z923 Personal history of irradiation: Secondary | ICD-10-CM | POA: Insufficient documentation

## 2024-10-15 DIAGNOSIS — C9000 Multiple myeloma not having achieved remission: Secondary | ICD-10-CM | POA: Insufficient documentation

## 2024-10-15 DIAGNOSIS — C184 Malignant neoplasm of transverse colon: Secondary | ICD-10-CM

## 2024-10-15 DIAGNOSIS — K769 Liver disease, unspecified: Secondary | ICD-10-CM | POA: Insufficient documentation

## 2024-10-15 DIAGNOSIS — D472 Monoclonal gammopathy: Secondary | ICD-10-CM | POA: Insufficient documentation

## 2024-10-15 DIAGNOSIS — C61 Malignant neoplasm of prostate: Secondary | ICD-10-CM

## 2024-10-15 DIAGNOSIS — C772 Secondary and unspecified malignant neoplasm of intra-abdominal lymph nodes: Secondary | ICD-10-CM | POA: Diagnosis not present

## 2024-10-15 DIAGNOSIS — C787 Secondary malignant neoplasm of liver and intrahepatic bile duct: Secondary | ICD-10-CM | POA: Diagnosis not present

## 2024-10-15 LAB — CBC WITH DIFFERENTIAL (CANCER CENTER ONLY)
Abs Immature Granulocytes: 0.01 K/uL (ref 0.00–0.07)
Basophils Absolute: 0 K/uL (ref 0.0–0.1)
Basophils Relative: 0 %
Eosinophils Absolute: 0.1 K/uL (ref 0.0–0.5)
Eosinophils Relative: 3 %
HCT: 23.4 % — ABNORMAL LOW (ref 39.0–52.0)
Hemoglobin: 7.4 g/dL — ABNORMAL LOW (ref 13.0–17.0)
Immature Granulocytes: 0 %
Lymphocytes Relative: 10 %
Lymphs Abs: 0.3 K/uL — ABNORMAL LOW (ref 0.7–4.0)
MCH: 29.7 pg (ref 26.0–34.0)
MCHC: 31.6 g/dL (ref 30.0–36.0)
MCV: 94 fL (ref 80.0–100.0)
Monocytes Absolute: 0.6 K/uL (ref 0.1–1.0)
Monocytes Relative: 20 %
Neutro Abs: 2 K/uL (ref 1.7–7.7)
Neutrophils Relative %: 67 %
Platelet Count: 106 K/uL — ABNORMAL LOW (ref 150–400)
RBC: 2.49 MIL/uL — ABNORMAL LOW (ref 4.22–5.81)
RDW: 16 % — ABNORMAL HIGH (ref 11.5–15.5)
WBC Count: 3.1 K/uL — ABNORMAL LOW (ref 4.0–10.5)
nRBC: 0 % (ref 0.0–0.2)

## 2024-10-15 LAB — PSA: Prostatic Specific Antigen: 0.76 ng/mL (ref 0.00–4.00)

## 2024-10-15 LAB — CMP (CANCER CENTER ONLY)
ALT: 22 U/L (ref 0–44)
AST: 38 U/L (ref 15–41)
Albumin: 3.1 g/dL — ABNORMAL LOW (ref 3.5–5.0)
Alkaline Phosphatase: 352 U/L — ABNORMAL HIGH (ref 38–126)
Anion gap: 10 (ref 5–15)
BUN: 65 mg/dL — ABNORMAL HIGH (ref 8–23)
CO2: 27 mmol/L (ref 22–32)
Calcium: 8.6 mg/dL — ABNORMAL LOW (ref 8.9–10.3)
Chloride: 100 mmol/L (ref 98–111)
Creatinine: 2.88 mg/dL — ABNORMAL HIGH (ref 0.61–1.24)
GFR, Estimated: 23 mL/min — ABNORMAL LOW (ref >60)
Glucose, Bld: 121 mg/dL — ABNORMAL HIGH (ref 70–99)
Potassium: 4 mmol/L (ref 3.5–5.1)
Sodium: 136 mmol/L (ref 135–145)
Total Bilirubin: 0.5 mg/dL (ref 0.0–1.2)
Total Protein: 6.2 g/dL — ABNORMAL LOW (ref 6.5–8.1)

## 2024-10-15 LAB — SAMPLE TO BLOOD BANK

## 2024-10-15 LAB — MAGNESIUM: Magnesium: 1.8 mg/dL (ref 1.7–2.4)

## 2024-10-15 NOTE — Progress Notes (Signed)
 Radiation Oncology Follow up Note  Name: Chris George   Date:   10/15/2024 MRN:  [id number] DOB: 1956-10-07     REFERRING PROVIDER: Jeffie Cheryl FORBES, MD  HPI: This 68 y.o. male presents to the clinic today for 25-month follow-up status post IMRT radiation therapy for his prostate for Gleason 7 adenocarcinoma now out 2-1/2 years prior.  Patient's also received SBRT treatment in March of this year for a retrocaval node most likely related to his metastatic colon cancer.  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  He is seen today in follow-up and is doing fairly well.  He specifically denies any increased lower Neri tract symptoms diarrhea or fatigue..  Patient had a PET scan back in October which I have reviewed showing a small hypermetabolic middle mediastinal lymph node.  Is also interval increase in metabolic activity retrocrural nodes with stable size.  Also has a single hypermetabolic lesion in the lateral right hepatic lobe.  Patient is followed by medical oncology is currently on a chemotherapy holiday.  COMPLICATIONS OF TREATMENT: none  FOLLOW UP COMPLIANCE: keeps appointments   PHYSICAL EXAM:  BP 100/63   Pulse (!) 56   Temp 97.6 F (36.4 C) (Tympanic)   Resp 20   Wt 205 lb (93 kg)   BMI 30.27 kg/m  Well-developed well-nourished patient in NAD. HEENT reveals PERLA, EOMI, discs not visualized.  Oral cavity is clear. No oral mucosal lesions are identified. Neck is clear without evidence of cervical or supraclavicular adenopathy. Lungs are clear to A&P. Cardiac examination is essentially unremarkable with regular rate and rhythm without murmur rub or thrill. Abdomen is benign with no organomegaly or masses noted. Motor sensory and DTR levels are equal and symmetric in the upper and lower extremities. Cranial nerves II through XII are grossly intact. Proprioception is intact. No peripheral adenopathy or edema is identified. No  motor or sensory levels are noted. Crude visual fields are within normal range.  PET scan reviewed  RADIOLOGY RESULTS: PET scan reviewed and new PET scan ordered for November 06, 2024  PLAN: Present time patient is being followed by medical oncology for multiple malignancies including prostate cancer locally [place] colorectal cancer and smoldering multiple myeloma.  He is under no treatment at this time he is scheduled near future for believe tricuspid valve replacement.  I have asked to see him back in 6 months for follow-up.  We ran a PSA level on him today which we will report to the patient when available.  Patient is to call the meantime with any concerns.  I would like to take this opportunity to thank you for allowing me to participate in the care of your patient.SABRA Marcey Penton, MD

## 2024-10-16 ENCOUNTER — Other Ambulatory Visit: Payer: Self-pay

## 2024-10-16 ENCOUNTER — Encounter: Payer: Self-pay | Admitting: Oncology

## 2024-10-16 ENCOUNTER — Other Ambulatory Visit: Payer: Self-pay | Admitting: *Deleted

## 2024-10-16 DIAGNOSIS — C184 Malignant neoplasm of transverse colon: Secondary | ICD-10-CM

## 2024-10-16 LAB — CEA: CEA: 564 ng/mL — ABNORMAL HIGH (ref 0.0–4.7)

## 2024-10-20 ENCOUNTER — Inpatient Hospital Stay

## 2024-10-20 NOTE — Progress Notes (Signed)
 [facility] CSW Progress Note  Clinical Child Psychotherapist contacted patient by phone to follow-up on emotional support.  Patient was recently hospitalized due to cardiac issues which will require two surgeries.  He is receiving physical therapy.    Interventions: Provided patient with information about [facility] discharge.  Patient stated he would like to continue receiving counseling.        Follow Up Plan:  CSW will follow-up with patient by phone on Tuesday, 12/23 at 3pm.    Macario CHRISTELLA Au, LCSW Clinical Social Worker [facility]

## 2024-10-26 ENCOUNTER — Inpatient Hospital Stay

## 2024-10-27 ENCOUNTER — Inpatient Hospital Stay

## 2024-10-27 ENCOUNTER — Telehealth: Payer: Self-pay | Admitting: Oncology

## 2024-10-27 ENCOUNTER — Inpatient Hospital Stay: Admitting: Nurse Practitioner

## 2024-10-27 NOTE — Telephone Encounter (Signed)
 Please add patient on for lab only visit early next week. Thanks!   Pt scheduled for lab only appt, I called the pt, no answer and vm was full. I will call the pt back again today to notify him of his scheduled lab appt.

## 2024-10-27 NOTE — Progress Notes (Signed)
 [facility] CSW Progress Note  Clinical Child Psychotherapist contacted patient by phone to follow-up on emotional support.    Interventions: Provided brief mental health counseling with regard to adjusting to his medical issues.  Patient was discharged from the hospital yesterday due to complications from a procedure last week.  He stated he is recovering well.  Patient is open to receiving counseling from the CSW intern, Hildegard Daring, upon her return next month.       Follow Up Plan:  CSW will follow-up with patient by phone     Macario CHRISTELLA Au, LCSW Clinical Social Worker [facility]

## 2024-10-30 ENCOUNTER — Inpatient Hospital Stay

## 2024-11-02 ENCOUNTER — Inpatient Hospital Stay

## 2024-11-03 ENCOUNTER — Inpatient Hospital Stay

## 2024-11-03 DIAGNOSIS — C184 Malignant neoplasm of transverse colon: Secondary | ICD-10-CM

## 2024-11-03 LAB — CMP (CANCER CENTER ONLY)
ALT: 40 U/L (ref 0–44)
AST: 61 U/L — ABNORMAL HIGH (ref 15–41)
Albumin: 3.5 g/dL (ref 3.5–5.0)
Alkaline Phosphatase: 496 U/L — ABNORMAL HIGH (ref 38–126)
Anion gap: 11 (ref 5–15)
BUN: 60 mg/dL — ABNORMAL HIGH (ref 8–23)
CO2: 32 mmol/L (ref 22–32)
Calcium: 9.1 mg/dL (ref 8.9–10.3)
Chloride: 95 mmol/L — ABNORMAL LOW (ref 98–111)
Creatinine: 2.82 mg/dL — ABNORMAL HIGH (ref 0.61–1.24)
GFR, Estimated: 24 mL/min — ABNORMAL LOW
Glucose, Bld: 130 mg/dL — ABNORMAL HIGH (ref 70–99)
Potassium: 3.9 mmol/L (ref 3.5–5.1)
Sodium: 137 mmol/L (ref 135–145)
Total Bilirubin: 0.9 mg/dL (ref 0.0–1.2)
Total Protein: 7.1 g/dL (ref 6.5–8.1)

## 2024-11-03 LAB — CBC WITH DIFFERENTIAL/PLATELET
Abs Immature Granulocytes: 0.01 K/uL (ref 0.00–0.07)
Basophils Absolute: 0 K/uL (ref 0.0–0.1)
Basophils Relative: 1 %
Eosinophils Absolute: 0.2 K/uL (ref 0.0–0.5)
Eosinophils Relative: 4 %
HCT: 29 % — ABNORMAL LOW (ref 39.0–52.0)
Hemoglobin: 9.1 g/dL — ABNORMAL LOW (ref 13.0–17.0)
Immature Granulocytes: 0 %
Lymphocytes Relative: 9 %
Lymphs Abs: 0.4 K/uL — ABNORMAL LOW (ref 0.7–4.0)
MCH: 28.8 pg (ref 26.0–34.0)
MCHC: 31.4 g/dL (ref 30.0–36.0)
MCV: 91.8 fL (ref 80.0–100.0)
Monocytes Absolute: 0.6 K/uL (ref 0.1–1.0)
Monocytes Relative: 14 %
Neutro Abs: 3.1 K/uL (ref 1.7–7.7)
Neutrophils Relative %: 72 %
Platelets: 114 K/uL — ABNORMAL LOW (ref 150–400)
RBC: 3.16 MIL/uL — ABNORMAL LOW (ref 4.22–5.81)
RDW: 15.1 % (ref 11.5–15.5)
WBC: 4.2 K/uL (ref 4.0–10.5)
nRBC: 0 % (ref 0.0–0.2)

## 2024-11-03 LAB — MAGNESIUM: Magnesium: 2 mg/dL (ref 1.7–2.4)

## 2024-11-03 LAB — SAMPLE TO BLOOD BANK

## 2024-11-04 LAB — CEA: CEA: 1207 ng/mL — ABNORMAL HIGH (ref 0.0–4.7)

## 2024-11-10 ENCOUNTER — Inpatient Hospital Stay: Admitting: Oncology

## 2024-11-10 ENCOUNTER — Inpatient Hospital Stay

## 2024-11-12 ENCOUNTER — Inpatient Hospital Stay

## 2024-11-16 ENCOUNTER — Telehealth: Payer: Self-pay | Admitting: *Deleted

## 2024-11-16 ENCOUNTER — Other Ambulatory Visit: Payer: Self-pay

## 2024-11-16 ENCOUNTER — Encounter: Payer: Self-pay | Admitting: Oncology

## 2024-11-16 ENCOUNTER — Encounter: Payer: Self-pay | Admitting: Emergency Medicine

## 2024-11-16 ENCOUNTER — Ambulatory Visit
Admission: EM | Admit: 2024-11-16 | Discharge: 2024-11-16 | Discharge status: Against Medical Advice | Attending: Emergency Medicine | Admitting: Emergency Medicine

## 2024-11-16 DIAGNOSIS — R062 Wheezing: Secondary | ICD-10-CM

## 2024-11-16 DIAGNOSIS — R0602 Shortness of breath: Secondary | ICD-10-CM

## 2024-11-16 DIAGNOSIS — R051 Acute cough: Secondary | ICD-10-CM

## 2024-11-16 DIAGNOSIS — K922 Gastrointestinal hemorrhage, unspecified: Secondary | ICD-10-CM

## 2024-11-16 DIAGNOSIS — K921 Melena: Secondary | ICD-10-CM

## 2024-11-16 DIAGNOSIS — C184 Malignant neoplasm of transverse colon: Secondary | ICD-10-CM

## 2024-11-16 NOTE — Discharge Instructions (Signed)
 I am concerned that your cough could be from COVID, pneumonia, congestive heart failure or bronchospasm.  It is difficult to tell the difference based on the limited resources we have here in the urgent care.  I feel that you need further testing to pinpoint what the cause of your cough is so that it can be treated appropriately.  Your dark stool is also blood in your stool.  I am concerned that you have a lower GI bleed, especially since you are on Eliquis .  The weakness that you are experiencing could be from acute blood loss.  I highly recommend that you go to the emergency department right now.  If you choose not to do so, then contact her specialists today.

## 2024-11-16 NOTE — ED Notes (Signed)
 Staff witness patient refused to sign the leaving hospital against medical advice.  Patient states he would not sign due to he may go to the hospital tomorrow.

## 2024-11-16 NOTE — Telephone Encounter (Signed)
 Patient agreeable to [facility] tomorrow and port labs. He can come tom at 930am. In the meantime, he has placed a call to his cardiologist and waiting a call back given he recently had heart valve repair.

## 2024-11-16 NOTE — ED Provider Notes (Signed)
 " HPI  SUBJECTIVE:  Chris George is a 69 y.o. male who presents with 2 issues: First, he reports 2 days of a dry cough, worsening dyspnea on exertion, lower extremity edema, 6 pound unintentional weight gain and generalized weakness.  No fevers, body aches, headaches, nasal congestion, rhinorrhea, wheezing, shortness of breath at rest, nocturia, PND, orthopnea, abdominal pain.  No known COVID or flu exposure.  He got 4-5 doses of the COVID-vaccine and this year's flu vaccine.  Denies chest pain, pressure, heaviness.  He is able to sleep at night without waking up coughing.  He tried cough syrup without improvement in his symptoms.  The symptoms are worse with exertion.  Second, he reports black tarry stools every time he stools for the past week.  No abdominal pain, distention.  Denies change in diet or Pepto-Bismol use.  He states he had similar symptoms before which was found to be due to an infection that was treated by his oncologist.  He has not tried anything for symptoms.  There are no aggravating or alleviating factors.  He has a past medical history of atrial fibrillation on Eliquis , chronic heart failure with recent mitral valve procedure, chronic kidney disease, multiple myeloma, no chemo in over 2 months, colon cancer, cardiomyopathy, hyperlipidemia, hypercholesterolemia, hypertension, CVA, pneumonia, lower GI bleed.  PCP: [facility].  He has specialists with cardiology, oncology and nephrology.  Past Medical History:  Diagnosis Date   A-fib (HCC)    Acute kidney injury superimposed on chronic kidney disease 11/19/2023   Acute on chronic heart failure (HCC) 11/19/2023   Acute on chronic heart failure with preserved ejection fraction (HCC) 11/19/2023   Anemia    Arthritis    ankles   Cancer (HCC)    multiple myeloma   Cancer of transverse colon (HCC) 10/26/2019   Cardiomyopathy (HCC)    Chemotherapy-induced peripheral neuropathy    CHF (congestive heart failure) (HCC)     Depression    Dysrhythmia    atrial fib   Elevated troponin 11/19/2023   History of CVA (cerebrovascular accident)    HLD (hyperlipidemia)    Hypercholesteremia    Hypertension    Moderate tricuspid insufficiency    Multiple myeloma (HCC)    not being treated right now per pt 11/11/19   Pneumonia    Priapism 07/25/2012   Severe mitral regurgitation 09/13/2024   Sleep apnea    No CPAP.  OSA resolved with wt loss.    Past Surgical History:  Procedure Laterality Date   ATRIAL ABLATION SURGERY     CARDIAC SURGERY     A-Fib Ablations   COLONOSCOPY N/A 03/17/2024   Procedure: COLONOSCOPY;  Surgeon: Jinny Carmine, MD;  Location: [facility] ENDOSCOPY;  Service: Endoscopy;  Laterality: N/A;   COLONOSCOPY WITH PROPOFOL  N/A 10/26/2019   Procedure: COLONOSCOPY WITH BIOPSY;  Surgeon: Jinny Carmine, MD;  Location: [facility] SURGERY CNTR;  Service: Endoscopy;  Laterality: N/A;   COLONOSCOPY WITH PROPOFOL  N/A 09/03/2022   Procedure: COLONOSCOPY WITH PROPOFOL ;  Surgeon: Jinny Carmine, MD;  Location: [facility] SUR[facility]ERY [facility];  Service: Endoscopy;  Laterality: N/A;   ESOPHAGOGASTRODUODENOSCOPY N/A 03/17/2024   Procedure: GI Bleeding;  Surgeon: Jinny Carmine, MD;  Location: [facility] ENDOSCOPY;  Service: Endoscopy;  Laterality: N/A;  GI Bleeding, EGD   ESOPHAGOGASTRODUODENOSCOPY (EGD) WITH PROPOFOL  N/A 10/26/2019   Procedure: ESOPHAGOGASTRODUODENOSCOPY (EGD) WITH BIOPSY;  Surgeon: Jinny Carmine, MD;  Location: [facility]ERY [facility];  Service: Endoscopy;  Laterality: N/A;  sleep apnea   LAPAROSCOPIC PARTIAL COLECTOMY Right  11/17/2019   Procedure: LAPAROSCOPIC PARTIAL COLECTOMY RIGHT EXTENDED;  Surgeon: Desiderio Schanz, MD;  Location: [facility] ORS;  Service: General;  Laterality: Right;   PORTACATH PLACEMENT N/A 12/28/2019   Procedure: INSERTION PORT-A-CATH Left subclavian;  Surgeon: Desiderio Schanz, MD;  Location: [facility] ORS;  Service: General;  Laterality: N/A;    Family History  Problem Relation Age of Onset   Healthy Mother    Colon  cancer Father    Parkinson's disease Father    Colon cancer Brother     Social History[1]  Current Medications[2]  Allergies[3]   ROS  As noted in HPI.   Physical Exam  BP (!) 97/44 (BP Location: Left Arm) Comment: states  this si normal  Pulse 99   Temp 98.1 F (36.7 C) (Oral)   Resp 20   SpO2 100%  BP Readings from Last 3 Encounters:  11/16/24 (!) 97/44  10/15/24 100/63  10/06/24 (!) 93/59    Constitutional: Well developed, well nourished, no acute distress Eyes: PERRL, EOMI, conjunctiva normal bilaterally HENT: Normocephalic, atraumatic,mucus membranes moist Respiratory: Fair air movement.  Diffuse expiratory wheezing throughout all lung fields with occasional crackles Cardiovascular: Normal rate and rhythm, no murmurs, no gallops, no rubs GI: Active bowel sounds, nondistended Rectal: Normal external appearance.  Normal rectal tone.  Dark stool on glove.  Hemoccult positive.  RN present during exam skin: No rash, skin intact Musculoskeletal: No edema, no tenderness, no deformities Neurologic: Alert & oriented x 3, CN III-XII grossly intact, no motor deficits, sensation grossly intact Psychiatric: Speech and behavior appropriate   ED Course   Medications - No data to display  Orders Placed This Encounter  Procedures   POC Hemoccult Bld/Stl (1-Cd Office Dx)    Standing Status:   Standing    Number of Occurrences:   1   Hemoccult positive at the bedside.  ED Clinical Impression  1. Acute cough   2. Wheezing   3. Shortness of breath   4. Blood in stool   5. Lower GI bleed      ED Assessment/Plan     Patient presents with cough, wheezing, worsening shortness of breath, unintentional weight gain for 2 days lower extremity edema and dark stools for a week.  Hemoccult is positive.  I had a lengthy discussion with the patient that this could be congestive heart failure, bronchospasm, pulmonary infection, COVID, influenza, pneumonia and that I am  concerned that he has a lower GI bleed.  I strongly recommended that he go to the emergency department.  I believe he is stable to go via private vehicle as he denies chest pain, palpitations, chest pressure or heaviness, however, patient states that he does not want to go and that he will follow-up with his multiple specialists.  I discussed with him that at the ED, they could do all of the testing to sort out precisely what is happening with him so they can tailor appropriate therapy and contact his specialists from there, so that he does not have to do it, and again he refused to go.  Will have him sign out AMA.  Encouraged him to go to the ED should he change his mind.  Patient refused to sign the AMA papers.  This was witnessed by Luke, RN and Darrin, CMA.  No orders of the defined types were placed in this encounter.     *This clinic note was created using Dragon dictation software. Therefore, there may be occasional mistakes despite careful proofreading. ?      [  1]  Social History Tobacco Use   Smoking status: Never    Passive exposure: Never   Smokeless tobacco: Never  Vaping Use   Vaping status: Never Used  Substance Use Topics   Alcohol use: Not Currently   Drug use: Never  [2] No current facility-administered medications for this encounter.  Current Outpatient Medications:    apixaban  (ELIQUIS ) 5 MG TABS tablet, Take 5 mg by mouth 2 (two) times daily., Disp: , Rfl:    atorvastatin  (LIPITOR) 80 MG tablet, Take 80 mg by mouth daily., Disp: , Rfl:    bumetanide  (BUMEX ) 2 MG tablet, Take 1.5 mg by mouth daily. Taking one in the morning., Disp: , Rfl:    calcitRIOL  (ROCALTROL ) 0.25 MCG capsule, Take 0.25 mcg by mouth daily., Disp: , Rfl:    colchicine  0.6 MG tablet, Take 1 tablet (0.6 mg total) by mouth daily as needed. Until flare resolves., Disp: , Rfl:    diphenoxylate -atropine  (LOMOTIL ) 2.5-0.025 MG tablet, Take 1 tablet by mouth 4 (four) times daily as needed for diarrhea  or loose stools., Disp: 30 tablet, Rfl: 0   gabapentin  (NEURONTIN ) 100 MG capsule, Take 2 capsules (200 mg total) by mouth 2 (two) times daily., Disp: 60 capsule, Rfl: 1   hydrocortisone  (ANUSOL -HC) 2.5 % rectal cream, Place 1 Application rectally 2 (two) times daily., Disp: 30 g, Rfl: 0   lidocaine -prilocaine  (EMLA ) cream, Apply 1 Application topically daily., Disp: , Rfl:    magnesium  chloride (SLOW-MAG) 64 MG TBEC SR tablet, Take 1 tablet (64 mg total) by mouth daily., Disp: 60 tablet, Rfl: 1   Multiple Vitamin (MULTIVITAMIN ADULT PO), Take 1 tablet by mouth daily., Disp: , Rfl:    ondansetron  (ZOFRAN ) 8 MG tablet, Take 1 tablet (8 mg total) by mouth every 8 (eight) hours as needed for nausea or vomiting., Disp: 20 tablet, Rfl: 0   oxyCODONE -acetaminophen  (PERCOCET/ROXICET) 5-325 MG tablet, Take 1-2 tablets by mouth every 6 (six) hours as needed for severe pain (pain score 7-10)., Disp: 60 tablet, Rfl: 0   prochlorperazine  (COMPAZINE ) 10 MG tablet, Take 1 tablet (10 mg total) by mouth every 6 (six) hours as needed for nausea or vomiting., Disp: 30 tablet, Rfl: 0   PSYLLIUM PO, Take 1 packet by mouth., Disp: , Rfl:    sodium bicarbonate  650 MG tablet, Take 1 tablet (650 mg total) by mouth 2 (two) times daily., Disp: 120 tablet, Rfl: 1   [Paused] spironolactone (ALDACTONE) 25 MG tablet, Take 1 tablet by mouth every morning., Disp: , Rfl:    tamsulosin  (FLOMAX ) 0.4 MG CAPS capsule, Take 1 capsule (0.4 mg total) by mouth 2 (two) times daily., Disp: 90 capsule, Rfl: 7   valACYclovir  (VALTREX ) 1000 MG tablet, Take 1 tablet (1,000 mg total) by mouth daily., Disp: 10 tablet, Rfl: 0  Facility-Administered Medications Ordered in Other Encounters:    heparin  lock flush 100 unit/mL, 500 Units, Intravenous, Once, Jacobo, Evalene PARAS, MD [3] No Known Allergies    Van Knee, MD 11/16/24 1420  "

## 2024-11-16 NOTE — Telephone Encounter (Addendum)
 Spoke to patient  Patient states he has developed a dry cough 2 days ago accompanied by increasing fatigue, generalized weakness (I feel like I did before my valve replacement) and SOB.  Patient has also noticed a return of some mild edema to his lower extremities. The patient denies fever, chills or body aches.  No known exposure to Covid or Influenza.    Patient was seen in urgent care today and was told by the provider that he should be seen in the ER.  Patient reports no tests were ordered but he was told he was wheezing when the provider listened to his lungs.     After speaking with patient and considering his past cardiac history and recent valve repair, I told the patient that I agree with the UC recommendation that he be seen in the ER today.    The patient stated he understands the recommendation but is going to call the surgeon who did his valve repair to see if he can be seen in that clinic today.   I again stressed the importance of ER visit today and early intervention if necessary.

## 2024-11-16 NOTE — ED Triage Notes (Signed)
 Complains of cough for 2 days Feeling fatigued for 2 days.   Dark stools for a week  No pain.  Has not contacted any of hi providers

## 2024-11-16 NOTE — ED Notes (Signed)
 At bedside for provider examination of patient -hemoccult was positive

## 2024-11-16 NOTE — Telephone Encounter (Signed)
 Patient reports that he presented to urgent care this morning with acute cough x 2 days; Wheezing; Shortness of breath; notes blood in stool from hemoccult testing at urgent care. He declined to be sent to ER and is requesting labs and evaluation to be drawn tomorrow to r/o low hgb as cause of shortness of breath.  He stated that he has dark brown stool, not bright red bleeding.      NURSING SYMPTOM TRIAGE ASSESSMENT & DISPOSITION  Mode of interaction:  Telephone Date of symptom triage interaction:  11/16/2024 Time of symptom triage interaction:  1411  Cancer diagnosis:  Cancer of transverse colon (HCC) Current anti-cancer treatment:  Patient is on Treatment Plan :  COLORECTAL FOLFIRI q14d   Oral chemotherapy:  No  Symptom Triage Protocol:  Dyspnea  History of the problem:  Dyspnea Attempt to obtain patient reported VS: Home Pulse Oximetry:  100%, Respiratory rate:  20, Heart Rate:  99, Blood Pressure:  97/44; (recheck 93/59) temp 98-- per patient vitals in urgent care.  Adventitious respiratory sounds heard by phone: Wheezes  Associated symptoms: Dyspnea  and Non-productive cough  Patient reported cyanosis or pallor: No  Timing of symptoms: Shortness of breath and wheezing started approximately 2 days ago.  Aggravating factors: Walking; activity  Are these acute or chronic symptoms: acute  Patient reported changes in mental status: no  Changes in ADLs: no     Smoking history: Never smoker     Additional commentary: 6-7 pound wt gain in last several days; no known exposures of covid/flu.  *h/o of chronic heart failure and ckd; h/o lower gi bleed. H/o colon ca, prostate ca and myleoma     Triage Nurse Guidance Signs and Symptoms Action  Sudden, unexpected increase in dyspnea at rest Chest pain Frothy pink sputum or gross hemoptysis Facial swelling Change in mental status Seek emergency care. Call an ambulance immediately.  Temperature higher than 100.21F (38C) with  suspected neutropenia Seek emergency care.   Increasing dyspnea with activity Temperature higher than 101.82F (38.6C) with-out suspected neutropenia Increased edema or swelling Change in cough or sputum production Uncontrollable cough New-onset wheezing Seek medical care within 24 hours.  Cough Shortness of breath Follow homecare instructions and seek medical care if no improvement within 24-48 hours.    Provider Consulted  Provider name and credentials: Dr. Jacobo and smc providers   Provider instruction: Labs/smc tom-per Dr. Jacobo Sermon with Tinnie, NP- v/o for cbc. Metc added     Patient Instruction  Disclaimer:  Patient specific and [employer] instruction provided verbally and sent through MyChart for active users.    Comply with medical therapy (e.g., antibiotics, antitussives, bronchodilators, opioids), respiratory treatment, oxygen, as prescribed.  Schedule activities that require more exertion (e.g., bathing) around periods of rest.  Get adequate sleep and rest.  Drink plenty (1-2 L) of fluids (unless restricted because of cardiac dysfunction or kidney disease) to help thin out secretions (unless underlying congestive heart failure is present).  Avoid precipitating factors that worsen dyspnea (e.g., anxiety, perfumes, tobacco smoke, cold or dry air). Stay inside on days when the air quality is poor.  Monitor for fever or any sign of infection and avoid contact with individuals who are sick.  Sitting upright and pursed-lip breathing can lessen dyspnea, as can relaxation training.  Report the Following Problems: Fever (temperature higher than 101.82F [38.6C], or 100.21F [38.1C] if receiving chemotherapy or if neutropenia is suspected) Change in sputum production (color, hemoptysis) Swelling of the feet or hands  Seek Emergency Care Immediately if Any of the Following Occurs: Worsening dyspnea, especially if accompanied by chest pain or gross  hemoptysis Swelling of the face Increased work of breathing (elevated respiratory or heart rate) Changes in mental status (somnolence, restlessness, confusion) Temperature higher than 100.82F (38C) with known neutropenia  Teach Back Method used:  Yes     Nurse Triage Priority:  Urgent (obtain medical orders as indicated with instruction for 8-24 hour or sooner follow-up)  Barriers to Care:  None identified  Nurse Triage Disposition:  In-person follow-up visit:  per Dr. Jacobo- lab/smc tomorrow. Patient contacted. He can come tomorrow 1/13 at 930 am  *pt has not actively logged into [facility] since 2024  Protocol Source:  Ruthine HERO., & Eldonna CANDIE Pee.). (2019). Telephone triage for oncology nurses (3rd ed.). Oncology Nursing Society.

## 2024-11-16 NOTE — ED Notes (Signed)
 Notified dr van.  Patient refused to sign AMA form.  Constance, CMA present for patient's statements at discharge that he was not going to Emergency Department as instructed.  He plans to call his providers and discuss with them.  The potential poor outcomes discussed.  Patient is adamant he is not going to ED.  He might go tomorrow, but is going to call his providers

## 2024-11-17 ENCOUNTER — Emergency Department

## 2024-11-17 ENCOUNTER — Other Ambulatory Visit: Payer: Self-pay

## 2024-11-17 ENCOUNTER — Inpatient Hospital Stay: Attending: Oncology | Admitting: Nurse Practitioner

## 2024-11-17 ENCOUNTER — Inpatient Hospital Stay

## 2024-11-17 ENCOUNTER — Inpatient Hospital Stay
Admission: EM | Admit: 2024-11-17 | Discharge: 2024-11-20 | DRG: 377 | Discharge status: Against Medical Advice | Attending: Pulmonary Disease | Admitting: Pulmonary Disease

## 2024-11-17 ENCOUNTER — Encounter: Payer: Self-pay | Admitting: Oncology

## 2024-11-17 ENCOUNTER — Encounter: Payer: Self-pay | Admitting: Nurse Practitioner

## 2024-11-17 VITALS — BP 82/50 | HR 110 | Temp 97.4°F | Resp 16 | Wt 194.6 lb

## 2024-11-17 DIAGNOSIS — R5381 Other malaise: Secondary | ICD-10-CM | POA: Diagnosis not present

## 2024-11-17 DIAGNOSIS — Z800 Family history of malignant neoplasm of digestive organs: Secondary | ICD-10-CM

## 2024-11-17 DIAGNOSIS — R350 Frequency of micturition: Secondary | ICD-10-CM | POA: Diagnosis not present

## 2024-11-17 DIAGNOSIS — C189 Malignant neoplasm of colon, unspecified: Secondary | ICD-10-CM | POA: Diagnosis present

## 2024-11-17 DIAGNOSIS — Z1152 Encounter for screening for COVID-19: Secondary | ICD-10-CM | POA: Diagnosis not present

## 2024-11-17 DIAGNOSIS — Z8673 Personal history of transient ischemic attack (TIA), and cerebral infarction without residual deficits: Secondary | ICD-10-CM | POA: Diagnosis not present

## 2024-11-17 DIAGNOSIS — F32A Depression, unspecified: Secondary | ICD-10-CM | POA: Diagnosis present

## 2024-11-17 DIAGNOSIS — I9589 Other hypotension: Secondary | ICD-10-CM | POA: Diagnosis present

## 2024-11-17 DIAGNOSIS — R0600 Dyspnea, unspecified: Secondary | ICD-10-CM

## 2024-11-17 DIAGNOSIS — I081 Rheumatic disorders of both mitral and tricuspid valves: Secondary | ICD-10-CM | POA: Diagnosis present

## 2024-11-17 DIAGNOSIS — G629 Polyneuropathy, unspecified: Secondary | ICD-10-CM | POA: Insufficient documentation

## 2024-11-17 DIAGNOSIS — R531 Weakness: Secondary | ICD-10-CM | POA: Insufficient documentation

## 2024-11-17 DIAGNOSIS — R578 Other shock: Secondary | ICD-10-CM | POA: Diagnosis not present

## 2024-11-17 DIAGNOSIS — R7401 Elevation of levels of liver transaminase levels: Secondary | ICD-10-CM | POA: Diagnosis not present

## 2024-11-17 DIAGNOSIS — I519 Heart disease, unspecified: Secondary | ICD-10-CM | POA: Diagnosis not present

## 2024-11-17 DIAGNOSIS — I5032 Chronic diastolic (congestive) heart failure: Secondary | ICD-10-CM | POA: Diagnosis present

## 2024-11-17 DIAGNOSIS — C61 Malignant neoplasm of prostate: Secondary | ICD-10-CM | POA: Insufficient documentation

## 2024-11-17 DIAGNOSIS — Z515 Encounter for palliative care: Secondary | ICD-10-CM

## 2024-11-17 DIAGNOSIS — K5520 Angiodysplasia of colon without hemorrhage: Secondary | ICD-10-CM

## 2024-11-17 DIAGNOSIS — Z7901 Long term (current) use of anticoagulants: Secondary | ICD-10-CM

## 2024-11-17 DIAGNOSIS — D472 Monoclonal gammopathy: Secondary | ICD-10-CM | POA: Diagnosis not present

## 2024-11-17 DIAGNOSIS — I5082 Biventricular heart failure: Secondary | ICD-10-CM | POA: Diagnosis present

## 2024-11-17 DIAGNOSIS — R062 Wheezing: Secondary | ICD-10-CM

## 2024-11-17 DIAGNOSIS — Z9049 Acquired absence of other specified parts of digestive tract: Secondary | ICD-10-CM

## 2024-11-17 DIAGNOSIS — Z8701 Personal history of pneumonia (recurrent): Secondary | ICD-10-CM

## 2024-11-17 DIAGNOSIS — I429 Cardiomyopathy, unspecified: Secondary | ICD-10-CM | POA: Diagnosis present

## 2024-11-17 DIAGNOSIS — Z952 Presence of prosthetic heart valve: Secondary | ICD-10-CM

## 2024-11-17 DIAGNOSIS — K7460 Unspecified cirrhosis of liver: Secondary | ICD-10-CM | POA: Diagnosis present

## 2024-11-17 DIAGNOSIS — R5383 Other fatigue: Secondary | ICD-10-CM | POA: Diagnosis not present

## 2024-11-17 DIAGNOSIS — I480 Paroxysmal atrial fibrillation: Secondary | ICD-10-CM | POA: Diagnosis present

## 2024-11-17 DIAGNOSIS — C184 Malignant neoplasm of transverse colon: Secondary | ICD-10-CM | POA: Insufficient documentation

## 2024-11-17 DIAGNOSIS — K317 Polyp of stomach and duodenum: Secondary | ICD-10-CM | POA: Diagnosis not present

## 2024-11-17 DIAGNOSIS — J9601 Acute respiratory failure with hypoxia: Secondary | ICD-10-CM | POA: Diagnosis present

## 2024-11-17 DIAGNOSIS — N170 Acute kidney failure with tubular necrosis: Secondary | ICD-10-CM | POA: Diagnosis present

## 2024-11-17 DIAGNOSIS — K649 Unspecified hemorrhoids: Secondary | ICD-10-CM | POA: Insufficient documentation

## 2024-11-17 DIAGNOSIS — I371 Nonrheumatic pulmonary valve insufficiency: Secondary | ICD-10-CM | POA: Diagnosis present

## 2024-11-17 DIAGNOSIS — K921 Melena: Principal | ICD-10-CM

## 2024-11-17 DIAGNOSIS — R571 Hypovolemic shock: Secondary | ICD-10-CM | POA: Diagnosis present

## 2024-11-17 DIAGNOSIS — D631 Anemia in chronic kidney disease: Secondary | ICD-10-CM | POA: Diagnosis present

## 2024-11-17 DIAGNOSIS — D649 Anemia, unspecified: Secondary | ICD-10-CM | POA: Insufficient documentation

## 2024-11-17 DIAGNOSIS — C9000 Multiple myeloma not having achieved remission: Secondary | ICD-10-CM | POA: Diagnosis present

## 2024-11-17 DIAGNOSIS — R001 Bradycardia, unspecified: Secondary | ICD-10-CM | POA: Diagnosis present

## 2024-11-17 DIAGNOSIS — N184 Chronic kidney disease, stage 4 (severe): Secondary | ICD-10-CM | POA: Diagnosis present

## 2024-11-17 DIAGNOSIS — C799 Secondary malignant neoplasm of unspecified site: Secondary | ICD-10-CM | POA: Diagnosis present

## 2024-11-17 DIAGNOSIS — I2729 Other secondary pulmonary hypertension: Secondary | ICD-10-CM | POA: Diagnosis present

## 2024-11-17 DIAGNOSIS — K922 Gastrointestinal hemorrhage, unspecified: Secondary | ICD-10-CM | POA: Diagnosis present

## 2024-11-17 DIAGNOSIS — M19071 Primary osteoarthritis, right ankle and foot: Secondary | ICD-10-CM | POA: Diagnosis present

## 2024-11-17 DIAGNOSIS — I130 Hypertensive heart and chronic kidney disease with heart failure and stage 1 through stage 4 chronic kidney disease, or unspecified chronic kidney disease: Secondary | ICD-10-CM | POA: Diagnosis present

## 2024-11-17 DIAGNOSIS — R0602 Shortness of breath: Secondary | ICD-10-CM

## 2024-11-17 DIAGNOSIS — D61818 Other pancytopenia: Secondary | ICD-10-CM | POA: Diagnosis present

## 2024-11-17 DIAGNOSIS — Z8774 Personal history of (corrected) congenital malformations of heart and circulatory system: Secondary | ICD-10-CM

## 2024-11-17 DIAGNOSIS — Z79899 Other long term (current) drug therapy: Secondary | ICD-10-CM

## 2024-11-17 DIAGNOSIS — K31819 Angiodysplasia of stomach and duodenum without bleeding: Secondary | ICD-10-CM | POA: Diagnosis not present

## 2024-11-17 DIAGNOSIS — T451X5A Adverse effect of antineoplastic and immunosuppressive drugs, initial encounter: Secondary | ICD-10-CM | POA: Diagnosis present

## 2024-11-17 DIAGNOSIS — R195 Other fecal abnormalities: Secondary | ICD-10-CM | POA: Diagnosis present

## 2024-11-17 DIAGNOSIS — K31811 Angiodysplasia of stomach and duodenum with bleeding: Secondary | ICD-10-CM | POA: Diagnosis present

## 2024-11-17 DIAGNOSIS — M19072 Primary osteoarthritis, left ankle and foot: Secondary | ICD-10-CM | POA: Diagnosis present

## 2024-11-17 DIAGNOSIS — K219 Gastro-esophageal reflux disease without esophagitis: Secondary | ICD-10-CM | POA: Diagnosis present

## 2024-11-17 DIAGNOSIS — E7800 Pure hypercholesterolemia, unspecified: Secondary | ICD-10-CM | POA: Diagnosis present

## 2024-11-17 DIAGNOSIS — D62 Acute posthemorrhagic anemia: Secondary | ICD-10-CM | POA: Diagnosis present

## 2024-11-17 DIAGNOSIS — I5031 Acute diastolic (congestive) heart failure: Secondary | ICD-10-CM | POA: Diagnosis not present

## 2024-11-17 DIAGNOSIS — Z5329 Procedure and treatment not carried out because of patient's decision for other reasons: Secondary | ICD-10-CM | POA: Diagnosis not present

## 2024-11-17 DIAGNOSIS — Z923 Personal history of irradiation: Secondary | ICD-10-CM

## 2024-11-17 LAB — CMP (CANCER CENTER ONLY)
ALT: 17 U/L (ref 0–44)
AST: 32 U/L (ref 15–41)
Albumin: 3.2 g/dL — ABNORMAL LOW (ref 3.5–5.0)
Alkaline Phosphatase: 286 U/L — ABNORMAL HIGH (ref 38–126)
Anion gap: 12 (ref 5–15)
BUN: 92 mg/dL — ABNORMAL HIGH (ref 8–23)
CO2: 25 mmol/L (ref 22–32)
Calcium: 8.5 mg/dL — ABNORMAL LOW (ref 8.9–10.3)
Chloride: 98 mmol/L (ref 98–111)
Creatinine: 3.51 mg/dL — ABNORMAL HIGH (ref 0.61–1.24)
GFR, Estimated: 18 mL/min — ABNORMAL LOW
Glucose, Bld: 130 mg/dL — ABNORMAL HIGH (ref 70–99)
Potassium: 3.7 mmol/L (ref 3.5–5.1)
Sodium: 135 mmol/L (ref 135–145)
Total Bilirubin: 0.4 mg/dL (ref 0.0–1.2)
Total Protein: 6.2 g/dL — ABNORMAL LOW (ref 6.5–8.1)

## 2024-11-17 LAB — HEMOGLOBIN AND HEMATOCRIT, BLOOD
HCT: 17.1 % — ABNORMAL LOW (ref 39.0–52.0)
Hemoglobin: 5.3 g/dL — ABNORMAL LOW (ref 13.0–17.0)

## 2024-11-17 LAB — CBC WITH DIFFERENTIAL (CANCER CENTER ONLY)
Abs Immature Granulocytes: 0.01 K/uL (ref 0.00–0.07)
Basophils Absolute: 0 K/uL (ref 0.0–0.1)
Basophils Relative: 1 %
Eosinophils Absolute: 0.1 K/uL (ref 0.0–0.5)
Eosinophils Relative: 4 %
HCT: 16.3 % — ABNORMAL LOW (ref 39.0–52.0)
Hemoglobin: 5 g/dL — CL (ref 13.0–17.0)
Immature Granulocytes: 1 %
Lymphocytes Relative: 12 %
Lymphs Abs: 0.3 K/uL — ABNORMAL LOW (ref 0.7–4.0)
MCH: 28.6 pg (ref 26.0–34.0)
MCHC: 30.7 g/dL (ref 30.0–36.0)
MCV: 93.1 fL (ref 80.0–100.0)
Monocytes Absolute: 0.5 K/uL (ref 0.1–1.0)
Monocytes Relative: 22 %
Neutro Abs: 1.3 K/uL — ABNORMAL LOW (ref 1.7–7.7)
Neutrophils Relative %: 60 %
Platelet Count: 94 K/uL — ABNORMAL LOW (ref 150–400)
RBC: 1.75 MIL/uL — ABNORMAL LOW (ref 4.22–5.81)
RDW: 17 % — ABNORMAL HIGH (ref 11.5–15.5)
WBC Count: 2.1 K/uL — ABNORMAL LOW (ref 4.0–10.5)
nRBC: 0 % (ref 0.0–0.2)

## 2024-11-17 LAB — RESP PANEL BY RT-PCR (RSV, FLU A&B, COVID)  RVPGX2
Influenza A by PCR: NEGATIVE
Influenza B by PCR: NEGATIVE
Resp Syncytial Virus by PCR: NEGATIVE
SARS Coronavirus 2 by RT PCR: NEGATIVE

## 2024-11-17 LAB — MAGNESIUM: Magnesium: 2.1 mg/dL (ref 1.7–2.4)

## 2024-11-17 LAB — PRO BRAIN NATRIURETIC PEPTIDE: Pro Brain Natriuretic Peptide: 2685 pg/mL — ABNORMAL HIGH

## 2024-11-17 LAB — SAMPLE TO BLOOD BANK

## 2024-11-17 LAB — PREPARE RBC (CROSSMATCH)

## 2024-11-17 MED ORDER — PANTOPRAZOLE SODIUM 40 MG IV SOLR
40.0000 mg | Freq: Once | INTRAVENOUS | Status: AC
  Administered 2024-11-17: 40 mg via INTRAVENOUS
  Filled 2024-11-17: qty 10

## 2024-11-17 MED ORDER — SODIUM BICARBONATE 650 MG PO TABS
650.0000 mg | ORAL_TABLET | Freq: Two times a day (BID) | ORAL | Status: DC
  Administered 2024-11-17 – 2024-11-20 (×5): 650 mg via ORAL
  Filled 2024-11-17 (×6): qty 1

## 2024-11-17 MED ORDER — GABAPENTIN 100 MG PO CAPS
200.0000 mg | ORAL_CAPSULE | Freq: Two times a day (BID) | ORAL | Status: DC
  Administered 2024-11-17 – 2024-11-20 (×5): 200 mg via ORAL
  Filled 2024-11-17 (×6): qty 2

## 2024-11-17 MED ORDER — TAMSULOSIN HCL 0.4 MG PO CAPS
0.4000 mg | ORAL_CAPSULE | Freq: Two times a day (BID) | ORAL | Status: DC
  Administered 2024-11-18 – 2024-11-20 (×4): 0.4 mg via ORAL
  Filled 2024-11-17 (×4): qty 1

## 2024-11-17 MED ORDER — SODIUM CHLORIDE 0.9 % IV SOLN: 50.0000 ug/h | INTRAVENOUS | Status: DC

## 2024-11-17 MED ORDER — IPRATROPIUM-ALBUTEROL 0.5-2.5 (3) MG/3ML IN SOLN
3.0000 mL | Freq: Four times a day (QID) | RESPIRATORY_TRACT | Status: DC
  Administered 2024-11-17 – 2024-11-18 (×2): 3 mL via RESPIRATORY_TRACT
  Filled 2024-11-17 (×4): qty 3

## 2024-11-17 MED ORDER — MIDODRINE HCL 5 MG PO TABS
10.0000 mg | ORAL_TABLET | ORAL | Status: AC
  Administered 2024-11-17: 10 mg via ORAL
  Filled 2024-11-17: qty 2

## 2024-11-17 MED ORDER — SODIUM CHLORIDE 0.9 % IV SOLN
20.0000 ug | Freq: Once | INTRAVENOUS | Status: AC
  Administered 2024-11-17: 20 ug via INTRAVENOUS
  Filled 2024-11-17: qty 5

## 2024-11-17 MED ORDER — SODIUM CHLORIDE 0.9 % IV SOLN
2.0000 g | INTRAVENOUS | Status: DC
  Administered 2024-11-17: 2 g via INTRAVENOUS
  Filled 2024-11-17 (×3): qty 20

## 2024-11-17 MED ORDER — ASPIRIN 81 MG PO TBEC
81.0000 mg | DELAYED_RELEASE_TABLET | Freq: Every day | ORAL | Status: DC
  Administered 2024-11-19 – 2024-11-20 (×2): 81 mg via ORAL
  Filled 2024-11-17 (×2): qty 1

## 2024-11-17 MED ORDER — LACTATED RINGERS IV BOLUS
250.0000 mL | Freq: Once | INTRAVENOUS | Status: AC
  Administered 2024-11-17: 250 mL via INTRAVENOUS

## 2024-11-17 MED ORDER — TRAZODONE HCL 50 MG PO TABS: 25.0000 mg | ORAL_TABLET | Freq: Every evening | ORAL | Status: DC | PRN

## 2024-11-17 MED ORDER — GUAIFENESIN-DM 100-10 MG/5ML PO SYRP
5.0000 mL | ORAL_SOLUTION | ORAL | Status: DC | PRN
  Administered 2024-11-18 – 2024-11-19 (×3): 5 mL via ORAL
  Filled 2024-11-17 (×3): qty 10

## 2024-11-17 MED ORDER — ONDANSETRON HCL 4 MG/2ML IJ SOLN: 4.0000 mg | Freq: Four times a day (QID) | INTRAMUSCULAR | Status: DC | PRN

## 2024-11-17 MED ORDER — PROTHROMBIN COMPLEX CONC HUMAN 1000 UNITS IV KIT
4388.0000 [IU] | PACK | Status: AC
  Administered 2024-11-17: 4388 [IU] via INTRAVENOUS
  Filled 2024-11-17: qty 4388

## 2024-11-17 MED ORDER — MIDODRINE HCL 5 MG PO TABS
10.0000 mg | ORAL_TABLET | Freq: Three times a day (TID) | ORAL | Status: DC
  Administered 2024-11-17 – 2024-11-20 (×7): 10 mg via ORAL
  Filled 2024-11-17 (×7): qty 2

## 2024-11-17 MED ORDER — SODIUM CHLORIDE 0.9 % IV SOLN
10.0000 mL/h | Freq: Once | INTRAVENOUS | Status: AC
  Administered 2024-11-17: 10 mL/h via INTRAVENOUS

## 2024-11-17 MED ORDER — LACTATED RINGERS IV BOLUS: 500.0000 mL | Freq: Once | INTRAVENOUS | Status: DC

## 2024-11-17 MED ORDER — ALBUTEROL SULFATE (2.5 MG/3ML) 0.083% IN NEBU
3.0000 mL | INHALATION_SOLUTION | Freq: Four times a day (QID) | RESPIRATORY_TRACT | Status: DC | PRN
  Administered 2024-11-18: 3 mL via RESPIRATORY_TRACT

## 2024-11-17 MED ORDER — ONDANSETRON HCL 4 MG PO TABS: 4.0000 mg | ORAL_TABLET | Freq: Four times a day (QID) | ORAL | Status: DC | PRN

## 2024-11-17 MED ORDER — CYCLOBENZAPRINE HCL 5 MG PO TABS
5.0000 mg | ORAL_TABLET | Freq: Three times a day (TID) | ORAL | Status: DC | PRN
  Administered 2024-11-17: 5 mg via ORAL
  Filled 2024-11-17: qty 1

## 2024-11-17 MED ORDER — PANTOPRAZOLE SODIUM 40 MG IV SOLR
40.0000 mg | Freq: Two times a day (BID) | INTRAVENOUS | Status: DC
  Administered 2024-11-17 – 2024-11-20 (×5): 40 mg via INTRAVENOUS
  Filled 2024-11-17 (×5): qty 10

## 2024-11-17 MED ORDER — ACETAMINOPHEN 325 MG PO TABS: 650.0000 mg | ORAL_TABLET | Freq: Four times a day (QID) | ORAL | Status: DC | PRN

## 2024-11-17 MED ORDER — HYDROMORPHONE HCL 1 MG/ML IJ SOLN
0.5000 mg | INTRAMUSCULAR | Status: DC | PRN
  Administered 2024-11-17: 0.5 mg via INTRAVENOUS
  Filled 2024-11-17: qty 1

## 2024-11-17 MED ORDER — ALLOPURINOL 100 MG PO TABS
50.0000 mg | ORAL_TABLET | Freq: Every day | ORAL | Status: DC
  Administered 2024-11-17 – 2024-11-20 (×3): 50 mg via ORAL
  Filled 2024-11-17 (×5): qty 0.5

## 2024-11-17 MED ORDER — ACETAMINOPHEN 650 MG RE SUPP: 650.0000 mg | Freq: Four times a day (QID) | RECTAL | Status: DC | PRN

## 2024-11-17 NOTE — Progress Notes (Signed)
 "  Symptom Management Clinic  [facility] A Department of the [place]. [facility] [address] [place], [place] [address] [phone] (phone) [phone] (fax)  Patient Care Team: Jeffie Cheryl BRAVO, MD as PCP - General (Family Medicine) [name], MD as Consulting Physician (Oncology) Lane Arthea BRAVO, MD as Referring Physician (Neurology) Marcelino Gales, MD as Consulting Physician (Nephrology) Jama Margery ORN, MD as Referring Physician (Cardiology) Lenn Aran, MD as Consulting Physician (Radiation Oncology)   Name of the patient: Chris George  [id number]  31-May-1956   Date of visit: 11/17/2024  Diagnosis- Colorectal Cancer  Chief complaint/ Reason for visit- Shortness of Breath  Heme/Onc history:  Oncology History  Cancer of transverse colon (HCC)  10/27/2019 Initial Diagnosis   Cancer of transverse colon (HCC)   12/01/2019 Cancer Staging   Staging form: Colon and Rectum, AJCC 8th Edition - Clinical stage from 12/01/2019: Stage IIIC (cT3, cN2b, cM0) - Signed by [name], MD on 12/01/2019   12/30/2019 - 06/03/2020 Chemotherapy   Patient is on Treatment Plan : COLORECTAL FOLFOX q14d x 6 months     05/15/2023 - 04/30/2024 Chemotherapy   Patient is on Treatment Plan : COLORECTAL FOLFIRI q14d     05/12/2024 -  Chemotherapy   Patient is on Treatment Plan : COLORECTAL FOLFIRI q14d       Interval history- Chris George is a 69 y.o. male with above history of colorectal cancer, heart failure, a fib, OSA, cirrhosis, C Diff, T2DM, prostate cancer, CKD, multiple myeloma, chronic anticoagulation, CVA, currently on chemo holiday, who presents to symptom management clinic for cough, wheezing, shortness of breath. He went to urgent care and was recommended to go to ER but he declined. He is concerned that he has a low hemoglobin that is causing his symptoms. He has a history of rectal bleeding and notes that in UC,  a hemoccult test was positive. He has noticed weight gain. Denies exposure to flu/covid/illness. Endorses dark stools for past several days.   He was recently hospitalized  For MV insufficiency and underwent TEER with PASCAL ACE, briefly admitted to CICU for pressure support for post-procedure hypotension. He receives IV diuresis with cardiology and most recently on 10/30/24.   He denies nausea, vomiting. Denies urinary complaints. Denies fevers or chills. Appetite is stable. Denies neurologic complaints. Denies chest pain or palpitations. He says that he would like to continue treatments.   Last imaging on 08/25/24 was PET which showed new small hypermetabolic middle mediastinal lymph node and increase in hypermetabolic activity of  the retrocrural node with stable size. Single hypermetabolic lesion in lateral right hepatic lobe, not previously identified. Ill defined nodal tissue between aorta and IVC not significantly changed. Intense physiologic activity noted throughout the left colon is stable. No metabolically active intraperitoneal mass identified.   CEA has been followed:  11/03/24 1207 10/15/24 564 09/01/24 273 (PET scan as above)- started chemo holiday 08/11/24 272 (cycle 15 of FOLFIRI chemotherapy) 07/14/24  269  ECOG FS:2 - Symptomatic, <50% confined to bed  Review of systems- Review of Systems  Constitutional:  Positive for malaise/fatigue. Negative for chills, fever and weight loss.  HENT:  Negative for hearing loss, nosebleeds, sore throat and tinnitus.   Eyes:  Negative for blurred vision and double vision.  Respiratory:  Positive for cough and shortness of breath. Negative for hemoptysis and wheezing.   Cardiovascular:  Negative for chest pain, palpitations, claudication and leg swelling.  Gastrointestinal:  Positive for melena. Negative for abdominal pain, blood in stool, constipation, diarrhea, nausea and vomiting.  Genitourinary:  Positive for frequency. Negative for  dysuria, flank pain, hematuria and urgency.  Musculoskeletal:  Negative for back pain, falls, joint pain and myalgias.  Skin:  Negative for itching and rash.  Neurological:  Negative for dizziness, tingling, sensory change, loss of consciousness, weakness and headaches.  Endo/Heme/Allergies:  Negative for environmental allergies. Does not bruise/bleed easily.  Psychiatric/Behavioral:  Negative for depression. The patient is not nervous/anxious and does not have insomnia.     Current treatment- chemo holiday  Allergies[1]  Past Medical History:  Diagnosis Date   A-fib (HCC)    Acute kidney injury superimposed on chronic kidney disease 11/19/2023   Acute on chronic heart failure (HCC) 11/19/2023   Acute on chronic heart failure with preserved ejection fraction (HCC) 11/19/2023   Anemia    Arthritis    ankles   Cancer (HCC)    multiple myeloma   Cancer of transverse colon (HCC) 10/26/2019   Cardiomyopathy (HCC)    Chemotherapy-induced peripheral neuropathy    CHF (congestive heart failure) (HCC)    Depression    Dysrhythmia    atrial fib   Elevated troponin 11/19/2023   History of CVA (cerebrovascular accident)    HLD (hyperlipidemia)    Hypercholesteremia    Hypertension    Moderate tricuspid insufficiency    Multiple myeloma (HCC)    not being treated right now per pt 11/11/19   Pneumonia    Priapism 07/25/2012   Severe mitral regurgitation 09/13/2024   Sleep apnea    No CPAP.  OSA resolved with wt loss.    Past Surgical History:  Procedure Laterality Date   ATRIAL ABLATION SURGERY     CARDIAC SURGERY     A-Fib Ablations   COLONOSCOPY N/A 03/17/2024   Procedure: COLONOSCOPY;  Surgeon: Jinny Carmine, MD;  Location: [facility] ENDOSCOPY;  Service: Endoscopy;  Laterality: N/A;   COLONOSCOPY WITH PROPOFOL  N/A 10/26/2019   Procedure: COLONOSCOPY WITH BIOPSY;  Surgeon: Jinny Carmine, MD;  Location: [facility] SURGERY CNTR;  Service: Endoscopy;  Laterality: N/A;   COLONOSCOPY WITH  PROPOFOL  N/A 09/03/2022   Procedure: COLONOSCOPY WITH PROPOFOL ;  Surgeon: Jinny Carmine, MD;  Location: [facility] SURGERY CNTR;  Service: Endoscopy;  Laterality: N/A;   ESOPHAGOGASTRODUODENOSCOPY N/A 03/17/2024   Procedure: GI Bleeding;  Surgeon: Jinny Carmine, MD;  Location: [facility] ENDOSCOPY;  Service: Endoscopy;  Laterality: N/A;  GI Bleeding, EGD   ESOPHAGOGASTRODUODENOSCOPY (EGD) WITH PROPOFOL  N/A 10/26/2019   Procedure: ESOPHAGOGASTRODUODENOSCOPY (EGD) WITH BIOPSY;  Surgeon: Jinny Carmine, MD;  Location: [facility] SURGERY CNTR;  Service: Endoscopy;  Laterality: N/A;  sleep apnea   LAPAROSCOPIC PARTIAL COLECTOMY Right 11/17/2019   Procedure: LAPAROSCOPIC PARTIAL COLECTOMY RIGHT EXTENDED;  Surgeon: Desiderio Schanz, MD;  Location: [facility] ORS;  Service: General;  Laterality: Right;   PORTACATH PLACEMENT N/A 12/28/2019   Procedure: INSERTION PORT-A-CATH Left subclavian;  Surgeon: Desiderio Schanz, MD;  Location: [facility] ORS;  Service: General;  Laterality: N/A;    Social History   Socioeconomic History   Marital status: Single    Spouse name: Not on file   Number of children: Not on file   Years of education: Not on file   Highest education level: Not on file  Occupational History   Not on file  Tobacco Use   Smoking status: Never    Passive exposure: Never   Smokeless tobacco: Never  Vaping Use   Vaping status: Never Used  Substance  and Sexual Activity   Alcohol use: Not Currently   Drug use: Never   Sexual activity: Yes    Birth control/protection: None  Other Topics Concern   Not on file  Social History Narrative   Not on file   Social Drivers of Health   Tobacco Use: Low Risk (11/17/2024)   Patient History    Smoking Tobacco Use: Never    Smokeless Tobacco Use: Never    Passive Exposure: Never  Financial Resource Strain: Low Risk  (08/10/2024)   Received from [facility]   Overall Financial Resource Strain (CARDIA)    Difficulty of Paying Living Expenses: Not hard at all   Recent Concern: Financial Resource Strain - Medium Risk (06/09/2024)   Received from [facility]   Overall Financial Resource Strain (CARDIA)    Difficulty of Paying Living Expenses: Somewhat hard  Food Insecurity: No Food Insecurity (08/10/2024)   Received from [facility]   Epic    Within the past 12 months, you worried that your food would run out before you got the money to buy more.: Never true    Within the past 12 months, the food you bought just didn't last and you didn't have money to get more.: Never true  Recent Concern: Food Insecurity - Food Insecurity Present (08/01/2024)   Epic    Worried About Programme Researcher, Broadcasting/film/video in the Last Year: Sometimes true    Ran Out of Food in the Last Year: Sometimes true  Transportation Needs: No Transportation Needs (08/10/2024)   Received from [facility]   PRAPARE - Transportation    In the past 12 months, has lack of transportation kept you from medical appointments or from getting medications?: No    Lack of Transportation (Non-Medical): No  Physical Activity: Inactive (11/08/2023)   Exercise Vital Sign    Days of Exercise per Week: 0 days    Minutes of Exercise per Session: 0 min  Stress: Not on file  Social Connections: Unknown (08/01/2024)   Social Connection and Isolation Panel    Frequency of Communication with Friends and Family: More than three times a week    Frequency of Social Gatherings with Friends and Family: More than three times a week    Attends Religious Services: Not on file    Active Member of Clubs or Organizations: Yes    Attends Banker Meetings: Not on file    Marital Status: Divorced  Intimate Partner Violence: Not At Risk (08/01/2024)   Epic    Fear of Current or Ex-Partner: No    Emotionally Abused: No    Physically Abused: No    Sexually Abused: No  Depression (PHQ2-9): Low Risk (10/15/2024)   Depression (PHQ2-9)    PHQ-2 Score: 0  Alcohol  Screen: Low Risk (11/08/2023)   Alcohol Screen    Last Alcohol Screening Score (AUDIT): 0  Housing: Low Risk  (08/10/2024)   Received [facility]   Epic    In the last 12 months, was there a time when you were not able to pay the mortgage or rent on time?: No    In the past 12 months, how many times have you moved where you were living?: 0    At any time in the past 12 months, were you homeless or living in a shelter (including now)?: No  Utilities: Not At Risk (08/10/2024)   Received from [facility]  Epic    In the past 12 months has the electric, gas, oil, or water  company threatened to shut off services in your home?: No  Health Literacy: Not on file   Family History  Problem Relation Age of Onset   Healthy Mother    Colon cancer Father    Parkinson's disease Father    Colon cancer Brother    Current Outpatient Medications:    allopurinol  (ZYLOPRIM ) 100 MG tablet, Take 50 mg by mouth daily., Disp: , Rfl:    apixaban  (ELIQUIS ) 5 MG TABS tablet, Take 5 mg by mouth 2 (two) times daily., Disp: , Rfl:    atorvastatin  (LIPITOR) 80 MG tablet, Take 80 mg by mouth daily., Disp: , Rfl:    bumetanide  (BUMEX ) 2 MG tablet, Take 1.5 mg by mouth daily. Taking one in the morning., Disp: , Rfl:    calcitRIOL  (ROCALTROL ) 0.25 MCG capsule, Take 0.25 mcg by mouth daily., Disp: , Rfl:    colchicine  0.6 MG tablet, Take 1 tablet (0.6 mg total) by mouth daily as needed. Until flare resolves., Disp: , Rfl:    cyclobenzaprine  (FLEXERIL ) 5 MG tablet, take 1 tablet by mouth three times daily as needed for muscle spasm for up to 10 days, Disp: , Rfl:    diphenoxylate -atropine  (LOMOTIL ) 2.5-0.025 MG tablet, Take 1 tablet by mouth 4 (four) times daily as needed for diarrhea or loose stools., Disp: 30 tablet, Rfl: 0   gabapentin  (NEURONTIN ) 100 MG capsule, Take 2 capsules (200 mg total) by mouth 2 (two) times daily., Disp: 60 capsule, Rfl: 1   hydrocortisone  (ANUSOL -HC) 2.5  % rectal cream, Place 1 Application rectally 2 (two) times daily., Disp: 30 g, Rfl: 0   lidocaine -prilocaine  (EMLA ) cream, Apply 1 Application topically daily., Disp: , Rfl:    magnesium  chloride (SLOW-MAG) 64 MG TBEC SR tablet, Take 1 tablet (64 mg total) by mouth daily., Disp: 60 tablet, Rfl: 1   midodrine  (PROAMATINE ) 10 MG tablet, Take 10 mg by mouth., Disp: , Rfl:    Multiple Vitamin (MULTIVITAMIN ADULT PO), Take 1 tablet by mouth daily., Disp: , Rfl:    ondansetron  (ZOFRAN ) 8 MG tablet, Take 1 tablet (8 mg total) by mouth every 8 (eight) hours as needed for nausea or vomiting., Disp: 20 tablet, Rfl: 0   oxyCODONE -acetaminophen  (PERCOCET/ROXICET) 5-325 MG tablet, Take 1-2 tablets by mouth every 6 (six) hours as needed for severe pain (pain score 7-10)., Disp: 60 tablet, Rfl: 0   potassium chloride  SA (KLOR-CON  M) 20 MEQ tablet, Take 20 mEq by mouth daily., Disp: , Rfl:    prochlorperazine  (COMPAZINE ) 10 MG tablet, Take 1 tablet (10 mg total) by mouth every 6 (six) hours as needed for nausea or vomiting., Disp: 30 tablet, Rfl: 0   PSYLLIUM PO, Take 1 packet by mouth., Disp: , Rfl:    sodium bicarbonate  650 MG tablet, Take 1 tablet (650 mg total) by mouth 2 (two) times daily., Disp: 120 tablet, Rfl: 1   [Paused] spironolactone (ALDACTONE) 25 MG tablet, Take 1 tablet by mouth every morning., Disp: , Rfl:    tamsulosin  (FLOMAX ) 0.4 MG CAPS capsule, Take 1 capsule (0.4 mg total) by mouth 2 (two) times daily., Disp: 90 capsule, Rfl: 7   valACYclovir  (VALTREX ) 1000 MG tablet, Take 1 tablet (1,000 mg total) by mouth daily., Disp: 10 tablet, Rfl: 0 No current facility-administered medications for this visit.  Facility-Administered Medications Ordered in Other Visits:    heparin  lock flush 100 unit/mL, 500 Units, Intravenous, Once,  [name], MD  Physical exam:  Vitals:   11/17/24 1017 11/17/24 1022 11/17/24 1024  BP: (!) 86/29 (!) 88/42 (!) 82/50  Pulse: (!) 110    Resp: 16    Temp:  (!) 97.4 F (36.3 C)    TempSrc: Tympanic    SpO2: 100%    Weight: 194 lb 9.6 oz (88.3 kg)     Physical Exam Vitals reviewed.  Constitutional:      General: He is not in acute distress.    Appearance: He is not ill-appearing.  Pulmonary:     Effort: Pulmonary effort is normal.  Skin:    General: Skin is warm.     Coloration: Skin is pale. Skin is not jaundiced.  Neurological:     Mental Status: He is alert and oriented to person, place, and time.  Psychiatric:        Mood and Affect: Mood normal.        Behavior: Behavior normal.        Latest Ref Rng & Units 11/17/2024   10:02 AM  CMP  Glucose 70 - 99 mg/dL 869   BUN 8 - 23 mg/dL 92   Creatinine 9.38 - 1.24 mg/dL 6.48   Sodium 864 - 854 mmol/L 135   Potassium 3.5 - 5.1 mmol/L 3.7   Chloride 98 - 111 mmol/L 98   CO2 22 - 32 mmol/L 25   Calcium  8.9 - 10.3 mg/dL 8.5   Total Protein 6.5 - 8.1 g/dL 6.2   Total Bilirubin 0.0 - 1.2 mg/dL 0.4   Alkaline Phos 38 - 126 U/L 286   AST 15 - 41 U/L 32   ALT 0 - 44 U/L 17       Latest Ref Rng & Units 11/17/2024   10:02 AM  CBC  WBC 4.0 - 10.5 K/uL 2.1   Hemoglobin 13.0 - 17.0 g/dL 5.0   Hematocrit 60.9 - 52.0 % 16.3   Platelets 150 - 400 K/uL 94    No results found.  Assessment and plan- Patient is a 69 y.o. male with history of colon cancer, currently on chemo holiday since October 2025, who presents to symptom management clinic for complaints of generalized weakness and shortness of breath.   Symptomatic Anemia- Hmg 5. Hypotensive. Recommend ER. Patient agrees. Given rising tumor marker concern for GI bleed related to his colorectal cancer. Recommend ct chest/abdomen/pelvis. He will need cardiac monitoring with transfusions.  Melena- for past week. Acute on chronic. Multiple risk factors for bleeding- possible tumor given rising cea. Recommend imaging and evaluation with GI.  Anticoagulation- chronic due to afib and prior cva. He had recent PASCAL ace implant. Unclear if  anticoagulation can be held. Recommend communicating with [facility].  Chronic CHF- receives iv diuresis with [facility]. Diuretics have been held recently d/t hypotension. He's on midodrine  and BP soft today.   I recommend evaluation in ER due to acuity of his condition. He is inagreement. REport called to ER, notified Dr Jacobo. Patient transported with nursing.    Visit Diagnosis 1. Symptomatic anemia   2. Melena   3. Cancer of transverse colon [facility])     Patient expressed understanding and was in agreement with this plan. He also understands that He can call clinic at any time with any questions, concerns, or complaints.   Thank you for allowing me to participate in the care of this very pleasant patient.   Tinnie Dawn, DNP, AGNP-C, AOCNP [facility]  [phone]    [1] No Known Allergies  "

## 2024-11-17 NOTE — ED Notes (Signed)
 RN reached out to provider about pt not receiving his bumetanide  (BUMEX ) 1.5 MG tablet twice a day.

## 2024-11-17 NOTE — Consult Note (Signed)
 "      Chris Copping, MD [facility]  [address], [address] [place], [place] [address] Phone: [phone] Fax : [phone]  Consultation  Referring Provider:     Quality Primary Care Physician:  Jeffie Cheryl BRAVO, MD Primary Gastroenterologist: [facility]         Reason for Consultation:     Melena  Date of Admission:  11/17/2024 Date of Consultation:  11/17/2024         HPI:   Chris George is a 69 y.o. male who was previously seen by me back in May for a GI bleed and underwent an EGD and colonoscopy.  The patient is reported to have cirrhosis and also has a history of a right hemicolectomy with metastases and a history of multiple myeloma.  The patient recently had a cardiac intervention at [facility] and was put on anticoagulation.  The EGD and colonoscopy by me back in May showed:  Colon: Impression: - Stool in the rectum and in the sigmoid colon. - A single bleeding colonic angiodysplastic lesion. Treated with argon plasma coagulation ( APC) . - Non- bleeding internal hemorrhoids. - No specimens collected.  EGD: Impression: - Normal esophagus. - Normal stomach. - Normal examined duodenum. - No specimens collected.  The patient has thrombocytopenia with his liver enzymes showing chronically elevated alkaline phosphatase.  The patient was now admitted with a report of black stools that have been present for the last 6 to 7 days.  He reports that the black stools are only once a day.  He has a history of chronic anemia and chronic kidney disease stage IV.  He denies taking any anti-inflammatory medications.  He denies seeing any bright red blood per rectum.  The patient started to feel fatigued and short of breath therefore he came to the emergency department.  The patient had been found to have bradycardia with hypotension.  I am now being asked to see the patient for a GI bleed  Past Medical History:  Diagnosis Date   A-fib (HCC)    Acute kidney injury superimposed on chronic kidney  disease 11/19/2023   Acute on chronic heart failure (HCC) 11/19/2023   Acute on chronic heart failure with preserved ejection fraction (HCC) 11/19/2023   Anemia    Arthritis    ankles   Cancer (HCC)    multiple myeloma   Cancer of transverse colon (HCC) 10/26/2019   Cardiomyopathy (HCC)    Chemotherapy-induced peripheral neuropathy    CHF (congestive heart failure) (HCC)    Depression    Dysrhythmia    atrial fib   Elevated troponin 11/19/2023   History of CVA (cerebrovascular accident)    HLD (hyperlipidemia)    Hypercholesteremia    Hypertension    Moderate tricuspid insufficiency    Multiple myeloma (HCC)    not being treated right now per pt 11/11/19   Pneumonia    Priapism 07/25/2012   Severe mitral regurgitation 09/13/2024   Sleep apnea    No CPAP.  OSA resolved with wt loss.    Past Surgical History:  Procedure Laterality Date   ATRIAL ABLATION SURGERY     CARDIAC SURGERY     A-Fib Ablations   COLONOSCOPY N/A 03/17/2024   Procedure: COLONOSCOPY;  Surgeon: George Rogelia, MD;  Location: [facility] END[facility]COPY;  Service: Endoscopy;  Laterality: N/A;   COLONOSCOPY WITH PROPOFOL  N/A 10/26/2019   Procedure: COLONOSCOPY WITH BIOPSY;  Surgeon: George Rogelia, MD;  Location: [facility];  Service: Endoscopy;  Laterality:  N/A;   COLONOSCOPY WITH PROPOFOL  N/A 09/03/2022   Procedure: COLONOSCOPY WITH PROPOFOL ;  Surgeon: Jinny Carmine, MD;  Location: [facility];  Service: Endoscopy;  Laterality: N/A;   ESOPHAGOGASTRODUODENOSCOPY N/A 03/17/2024   Procedure: GI Bleeding;  Surgeon: Jinny Carmine, MD;  Location: [facility] ENDOSCOPY;  Service: Endoscopy;  Laterality: N/A;  GI Bleeding, EGD   ESOPHAGOGASTRODUODENOSCOPY (EGD) WITH PROPOFOL  N/A 10/26/2019   Procedure: ESOPHAGOGASTRODUODENOSCOPY (EGD) WITH BIOPSY;  Surgeon: Jinny Carmine, MD;  Location: [facility];  Service: Endoscopy;  Laterality: N/A;  sleep apnea   LAPAROSCOPIC PARTIAL COLECTOMY Right 11/17/2019   Procedure:  LAPAROSCOPIC PARTIAL COLECTOMY RIGHT EXTENDED;  Surgeon: Desiderio Schanz, MD;  Location: [facility] ORS;  Service: General;  Laterality: Right;   PORTACATH PLACEMENT N/A 12/28/2019   Procedure: INSERTION PORT-A-CATH Left subclavian;  Surgeon: Desiderio Schanz, MD;  Location: [facility] ORS;  Service: General;  Laterality: N/A;    Prior to Admission medications  Medication Sig Start Date End Date Taking? Authorizing Provider  allopurinol  (ZYLOPRIM ) 100 MG tablet Take 50 mg by mouth daily. 11/12/24   [provider]  apixaban  (ELIQUIS ) 5 MG TABS tablet Take 5 mg by mouth 2 (two) times daily.    [provider]  atorvastatin  (LIPITOR) 80 MG tablet Take 80 mg by mouth daily.    [provider]  bumetanide  (BUMEX ) 2 MG tablet Take 1.5 mg by mouth daily. Taking one in the morning. 12/17/23 12/11/24  [provider]  calcitRIOL  (ROCALTROL ) 0.25 MCG capsule Take 0.25 mcg by mouth daily. 06/22/24 06/22/25  [provider]  colchicine  0.6 MG tablet Take 1 tablet (0.6 mg total) by mouth daily as needed. Until flare resolves. 03/17/24   Jens Durand, MD  cyclobenzaprine  (FLEXERIL ) 5 MG tablet take 1 tablet by mouth three times daily as needed for muscle spasm for up to 10 days 09/29/24   [provider]  diphenoxylate -atropine  (LOMOTIL ) 2.5-0.025 MG tablet Take 1 tablet by mouth 4 (four) times daily as needed for diarrhea or loose stools. 08/22/24   Borders, Fonda SAUNDERS, NP  gabapentin  (NEURONTIN ) 100 MG capsule Take 2 capsules (200 mg total) by mouth 2 (two) times daily. 08/02/24   Wouk, Devaughn Sayres, MD  hydrocortisone  (ANUSOL -HC) 2.5 % rectal cream Place 1 Application rectally 2 (two) times daily. 05/21/24   Brimage, Vondra, DO  lidocaine -prilocaine  (EMLA ) cream Apply 1 Application topically daily.    [provider]  magnesium  chloride (SLOW-MAG) 64 MG TBEC SR tablet Take 1 tablet (64 mg total) by mouth daily. 11/08/23   Borders, Fonda SAUNDERS, NP  midodrine  (PROAMATINE ) 10 MG  tablet Take 10 mg by mouth. 10/26/24   [provider]  Multiple Vitamin (MULTIVITAMIN ADULT PO) Take 1 tablet by mouth daily.    [provider]  ondansetron  (ZOFRAN ) 8 MG tablet Take 1 tablet (8 mg total) by mouth every 8 (eight) hours as needed for nausea or vomiting. 05/05/24   [name], MD  oxyCODONE -acetaminophen  (PERCOCET/ROXICET) 5-325 MG tablet Take 1-2 tablets by mouth every 6 (six) hours as needed for severe pain (pain score 7-10). 04/29/24   [name], MD  potassium chloride  SA (KLOR-CON  M) 20 MEQ tablet Take 20 mEq by mouth daily. 11/03/24   [provider]  prochlorperazine  (COMPAZINE ) 10 MG tablet Take 1 tablet (10 mg total) by mouth every 6 (six) hours as needed for nausea or vomiting. 05/05/24   Finnegan, Timothy J, MD  PSYLLIUM PO Take 1 packet by mouth.    [provider]  sodium bicarbonate  650 MG tablet Take 1 tablet (650 mg total) by mouth 2 (two) times daily. 08/02/24 08/02/25  Wouk, Devaughn Sayres, MD  [Paused] spironolactone (ALDACTONE) 25 MG tablet Take 1 tablet by mouth every morning. Wait to take this until your doctor or other care provider tells you to start again. 12/17/23 12/11/24  [provider]  tamsulosin  (FLOMAX ) 0.4 MG CAPS capsule Take 1 capsule (0.4 mg total) by mouth 2 (two) times daily. 10/06/24   Francisca Redell BROCKS, MD  valACYclovir  (VALTREX ) 1000 MG tablet Take 1 tablet (1,000 mg total) by mouth daily. 08/21/24   Borders, Fonda SAUNDERS, NP    Family History  Problem Relation Age of Onset   Healthy Mother    Colon cancer Father    Parkinson's disease Father    Colon cancer Brother      Social History[1]  Allergies as of 11/17/2024   (No Known Allergies)    Review of Systems:    All systems reviewed and negative except where noted in HPI.   Physical Exam:  Vital signs in last 24 hours: Temp:  [97.4 F (36.3 C)-98.6 F (37 C)] 98.6 F (37 C) (01/13 1103) Pulse Rate:  [49-110] 49 (01/13  1230) Resp:  [14-18] 15 (01/13 1230) BP: (82-103)/(29-56) 103/56 (01/13 1230) SpO2:  [100 %] 100 % (01/13 1230) Weight:  [86.6 kg-88.3 kg] 86.6 kg (01/13 1107)   General:   Pleasant, cooperative in NAD Head:  Normocephalic and atraumatic. Eyes:   No icterus.   Conjunctiva pink. PERRLA. Ears:  Normal auditory acuity. Neck:  Supple; no masses or thyroidomegaly Lungs: Respirations even and unlabored. Lungs with both inspiratory and expiratory wheezes, without any crackles, or rhonchi.  Heart:  Regular rate and rhythm;  Without murmur, clicks, rubs or gallops Abdomen:  Soft, nondistended, nontender. Normal bowel sounds. No appreciable masses or hepatomegaly.  No rebound or guarding.  Rectal:  Not performed. Msk:  Symmetrical without gross deformities.   Extremities:  Without edema, cyanosis or clubbing. Neurologic:  Alert and oriented x3;  grossly normal neurologically. Skin:  Intact without significant lesions or rashes. Cervical Nodes:  No significant cervical adenopathy. Psych:  Alert and cooperative. Normal affect.  LAB RESULTS: Recent Labs    11/17/24 1002  WBC 2.1*  HGB 5.0*  HCT 16.3*  PLT 94*   BMET Recent Labs    11/17/24 1002  NA 135  K 3.7  CL 98  CO2 25  GLUCOSE 130*  BUN 92*  CREATININE 3.51*  CALCIUM  8.5*   LFT Recent Labs    11/17/24 1002  PROT 6.2*  ALBUMIN 3.2*  AST 32  ALT 17  ALKPHOS 286*  BILITOT 0.4   PT/INR No results for input(s): LABPROT, INR in the last 72 hours.  STUDIES: DG Chest Portable 1 View Result Date: 11/17/2024 CLINICAL DATA:  Dyspnea EXAM: PORTABLE CHEST 1 VIEW COMPARISON:  Mar 13, 2024 FINDINGS: Stable cardiomegaly. Left subclavian Port-A-Cath is unchanged. Both lungs are clear. The visualized skeletal structures are unremarkable. IMPRESSION: No active disease. Electronically Signed   By: Lynwood Landy Raddle M.D.   On: 11/17/2024 12:12      Impression / Plan:   Assessment: Principal Problem:   Lower GI  bleed   Chris George is a 69 y.o. y/o male with a history of metastatic colon cancer with a right hemicolectomy and a AVM found that her last colonoscopy in May 2025 that was treated with argon plasma coagulation.  The patient now  comes in with black stools after being started on anticoagulation for a cardiac procedure.  The patient came in feeling weak and fatigued.  He was found to have significant anemia  Plan:  The patient will be set up for an EGD for tomorrow due to the melena.  The patient will need to be transfused and stabilized.  The patient reports that the black stools have been going on for a week and is only 1 or 2 bowel movements a day.  This indicates that this is likely a slow GI bleed and not a massive GI bleed.  The patient will be started on a diet and be kept n.p.o. today for an EGD tomorrow.  The patient does not need to be started on any octreotide at this time due to his last EGD only a few months ago showing no sign of esophageal varices.  PPI IV twice daily  Continue serial CBCs and transfuse PRN Avoid NSAIDs Maintain 2 large-bore IV lines Please page GI with any acute hemodynamic changes, or signs of active GI bleeding   Thank you for involving me in the care of this patient.      LOS: 0 days   Chris Copping, MD, MD. NOL[facility] 11/17/2024, 1:11 PM,  Pager [phone] 7am-5pm  Check AMION for 5pm -7am coverage and on weekends   Note: This dictation was prepared with Dragon dictation along with smaller phrase technology. Any transcriptional errors that result from this process are unintentional.       [1]  Social History Tobacco Use   Smoking status: Never    Passive exposure: Never   Smokeless tobacco: Never  Vaping Use   Vaping status: Never Used  Substance Use Topics   Alcohol use: Not Currently   Drug use: Never   "

## 2024-11-17 NOTE — H&P (Addendum)
 With History and Physical    Chris George FMW:[id number] DOB: June 25, 1956 DOA: 11/17/2024  PCP: Jeffie Cheryl FORBES, MD (Confirm with patient/family/NH records and if not entered, this has to be entered at [facility] point of entry) Patient coming from: Home  I have personally briefly reviewed patient's old medical records in [facility]  Chief Complaint: Black stool, feeling tired  HPI: Chris George is a 69 y.o. male with medical history significant of ASD status post closure, chronic right-sided CHF, PAF on Eliquis , chronic hypotension on midodrine , mitral regurgitation status post repair in December 2025, colon cancer status post partial colectomy, on chemotherapy, prostate cancer status post radiation therapy, smoldering multiple myeloma, chronic anemia, CKD stage IV, GERD presented with 7 days of black tarry stool and fatigue generalized weakness.  Symptoms started 7 days ago, patient started noticed dark-colored stool 1-2 episodes a day, increasing has been feeling fatigue and general weakness but denied any shortness of breath no chest pains or lightheadedness.  Yesterday, patient started to feel increasing shortness of breath and came to ED, but before workup was done patient signed AMA.  This morning he came back again for persistent feeling of fatigue and shortness of breath.  ED Course: Afebrile, bradycardia blood pressure 90/40, O2 saturation 100% on room air.  Chest x-ray cardiomegaly but no acute infiltrates, blood work showed hemoglobin 5.0 compared to baseline 9.0-10, WBC 2.1 platelet 94 BUN 92 creatinine 3.5 compared to baseline 2.4-2.8 glucose 130 bicarb 25K 3.7.  PRBC x 2 ordered.  Patient was given PPI x 1 in the ED.  Review of Systems: As per HPI otherwise 14 point review of systems negative.    Past Medical History:  Diagnosis Date   A-fib (HCC)    Acute kidney injury superimposed on chronic kidney disease 11/19/2023   Acute on chronic heart failure (HCC) 11/19/2023    Acute on chronic heart failure with preserved ejection fraction (HCC) 11/19/2023   Anemia    Arthritis    ankles   Cancer (HCC)    multiple myeloma   Cancer of transverse colon (HCC) 10/26/2019   Cardiomyopathy (HCC)    Chemotherapy-induced peripheral neuropathy    CHF (congestive heart failure) (HCC)    Depression    Dysrhythmia    atrial fib   Elevated troponin 11/19/2023   History of CVA (cerebrovascular accident)    HLD (hyperlipidemia)    Hypercholesteremia    Hypertension    Moderate tricuspid insufficiency    Multiple myeloma (HCC)    not being treated right now per pt 11/11/19   Pneumonia    Priapism 07/25/2012   Severe mitral regurgitation 09/13/2024   Sleep apnea    No CPAP.  OSA resolved with wt loss.    Past Surgical History:  Procedure Laterality Date   ATRIAL ABLATION SURGERY     CARDIAC SURGERY     A-Fib Ablations   COLONOSCOPY N/A 03/17/2024   Procedure: COLONOSCOPY;  Surgeon: Jinny Carmine, MD;  Location: [facility] ENDOSCOPY;  Service: Endoscopy;  Laterality: N/A;   COLONOSCOPY WITH PROPOFOL  N/A 10/26/2019   Procedure: COLONOSCOPY WITH BIOPSY;  Surgeon: Jinny Carmine, MD;  Location: [facility] SURGERY CNTR;  Service: Endoscopy;  Laterality: N/A;   COLONOSCOPY WITH PROPOFOL  N/A 09/03/2022   Procedure: COLONOSCOPY WITH PROPOFOL ;  Surgeon: Jinny Carmine, MD;  Location: [facility] SURGERY CNTR;  Service: Endoscopy;  Laterality: N/A;   ESOPHAGOGASTRODUODENOSCOPY N/A 03/17/2024   Procedure: GI Bleeding;  Surgeon: Jinny Carmine, MD;  Location: [facility] ENDOSCOPY;  Service: Endoscopy;  Laterality: N/A;  GI Bleeding, EGD   ESOPHAGOGASTRODUODENOSCOPY (EGD) WITH PROPOFOL  N/A 10/26/2019   Procedure: ESOPHAGOGASTRODUODENOSCOPY (EGD) WITH BIOPSY;  Surgeon: Jinny Carmine, MD;  Location: [facility] SURGERY CNTR;  Service: Endoscopy;  Laterality: N/A;  sleep apnea   LAPAROSCOPIC PARTIAL COLECTOMY Right 11/17/2019   Procedure: LAPAROSCOPIC PARTIAL COLECTOMY RIGHT EXTENDED;  Surgeon: Desiderio Schanz, MD;   Location: [facility] ORS;  Service: General;  Laterality: Right;   PORTACATH PLACEMENT N/A 12/28/2019   Procedure: INSERTION PORT-A-CATH Left subclavian;  Surgeon: Desiderio Schanz, MD;  Location: [facility] ORS;  Service: General;  Laterality: N/A;     reports that he has never smoked. He has never been exposed to tobacco smoke. He has never used smokeless tobacco. He reports that he does not currently use alcohol. He reports that he does not use drugs.  Allergies[1]  Family History  Problem Relation Age of Onset   Healthy Mother    Colon cancer Father    Parkinson's disease Father    Colon cancer Brother      Prior to Admission medications  Medication Sig Start Date End Date Taking? Authorizing Provider  allopurinol  (ZYLOPRIM ) 100 MG tablet Take 50 mg by mouth daily. 11/12/24   [provider]  apixaban  (ELIQUIS ) 5 MG TABS tablet Take 5 mg by mouth 2 (two) times daily.    [provider]  atorvastatin  (LIPITOR) 80 MG tablet Take 80 mg by mouth daily.    [provider]  bumetanide  (BUMEX ) 2 MG tablet Take 1.5 mg by mouth daily. Taking one in the morning. 12/17/23 12/11/24  [provider]  calcitRIOL  (ROCALTROL ) 0.25 MCG capsule Take 0.25 mcg by mouth daily. 06/22/24 06/22/25  [provider]  colchicine  0.6 MG tablet Take 1 tablet (0.6 mg total) by mouth daily as needed. Until flare resolves. 03/17/24   Jens Durand, MD  cyclobenzaprine  (FLEXERIL ) 5 MG tablet take 1 tablet by mouth three times daily as needed for muscle spasm for up to 10 days 09/29/24   [provider]  diphenoxylate -atropine  (LOMOTIL ) 2.5-0.025 MG tablet Take 1 tablet by mouth 4 (four) times daily as needed for diarrhea or loose stools. 08/22/24   Borders, Fonda SAUNDERS, NP  gabapentin  (NEURONTIN ) 100 MG capsule Take 2 capsules (200 mg total) by mouth 2 (two) times daily. 08/02/24   Wouk, Devaughn Sayres, MD  hydrocortisone  (ANUSOL -HC) 2.5 % rectal cream Place 1 Application rectally 2 (two)  times daily. 05/21/24   Brimage, Vondra, DO  lidocaine -prilocaine  (EMLA ) cream Apply 1 Application topically daily.    [provider]  magnesium  chloride (SLOW-MAG) 64 MG TBEC SR tablet Take 1 tablet (64 mg total) by mouth daily. 11/08/23   Borders, Fonda SAUNDERS, NP  midodrine  (PROAMATINE ) 10 MG tablet Take 10 mg by mouth. 10/26/24   [provider]  Multiple Vitamin (MULTIVITAMIN ADULT PO) Take 1 tablet by mouth daily.    [provider]  ondansetron  (ZOFRAN ) 8 MG tablet Take 1 tablet (8 mg total) by mouth every 8 (eight) hours as needed for nausea or vomiting. 05/05/24   [name], MD  oxyCODONE -acetaminophen  (PERCOCET/ROXICET) 5-325 MG tablet Take 1-2 tablets by mouth every 6 (six) hours as needed for severe pain (pain score 7-10). 04/29/24   [name], MD  potassium chloride  SA (KLOR-CON  M) 20 MEQ tablet Take 20 mEq by mouth daily. 11/03/24   [provider]  prochlorperazine  (COMPAZINE ) 10 MG tablet Take 1 tablet (10 mg total) by mouth every 6 (six) hours as needed for  nausea or vomiting. 05/05/24   Finnegan, Timothy J, MD  PSYLLIUM PO Take 1 packet by mouth.    [provider]  sodium bicarbonate  650 MG tablet Take 1 tablet (650 mg total) by mouth 2 (two) times daily. 08/02/24 08/02/25  Wouk, Devaughn Sayres, MD  [Paused] spironolactone (ALDACTONE) 25 MG tablet Take 1 tablet by mouth every morning. Wait to take this until your doctor or other care provider tells you to start again. 12/17/23 12/11/24  [provider]  tamsulosin  (FLOMAX ) 0.4 MG CAPS capsule Take 1 capsule (0.4 mg total) by mouth 2 (two) times daily. 10/06/24   Francisca Redell BROCKS, MD  valACYclovir  (VALTREX ) 1000 MG tablet Take 1 tablet (1,000 mg total) by mouth daily. 08/21/24   Borders, Fonda SAUNDERS, NP    Physical Exam: Vitals:   11/17/24 1103 11/17/24 1107  BP: (!) 88/41   Pulse: (!) 52   Resp: 18   Temp: 98.6 F (37 C)   TempSrc: Oral   SpO2: 100%   Weight:  86.6 kg   Height:  5' 9 (1.753 m)    Constitutional: NAD, calm, comfortable Vitals:   11/17/24 1103 11/17/24 1107  BP: (!) 88/41   Pulse: (!) 52   Resp: 18   Temp: 98.6 F (37 C)   TempSrc: Oral   SpO2: 100%   Weight:  86.6 kg  Height:  5' 9 (1.753 m)   Eyes: PERRL, lids and conjunctivae normal ENMT: Mucous membranes are moist. Posterior pharynx clear of any exudate or lesions.Normal dentition.  Neck: normal, supple, no masses, no thyromegaly Respiratory: clear to auscultation bilaterally, no wheezing, no crackles. Normal respiratory effort. No accessory muscle use.  Cardiovascular: Regular rate and rhythm, no murmurs / rubs / gallops. 2+ extremity edema. 2+ pedal pulses. No carotid bruits.  Abdomen: no tenderness, no masses palpated. No hepatosplenomegaly. Bowel sounds positive.  Musculoskeletal: no clubbing / cyanosis. No joint deformity upper and lower extremities. Good ROM, no contractures. Normal muscle tone.  Skin: no rashes, lesions, ulcers. No induration Neurologic: CN 2-12 grossly intact. Sensation intact, DTR normal. Strength 5/5 in all 4.  Psychiatric: Normal judgment and insight. Alert and oriented x 3. Normal mood.     Labs on Admission: I have personally reviewed following labs and imaging studies  CBC: Recent Labs  Lab 11/17/24 1002  WBC 2.1*  NEUTROABS 1.3*  HGB 5.0*  HCT 16.3*  MCV 93.1  PLT 94*   Basic Metabolic Panel: Recent Labs  Lab 11/17/24 1002  NA 135  K 3.7  CL 98  CO2 25  GLUCOSE 130*  BUN 92*  CREATININE 3.51*  CALCIUM  8.5*  MG 2.1   GFR: Estimated Creatinine Clearance: 22 mL/min (A) (by C-G formula based on SCr of 3.51 mg/dL (H)). Liver Function Tests: Recent Labs  Lab 11/17/24 1002  AST 32  ALT 17  ALKPHOS 286*  BILITOT 0.4  PROT 6.2*  ALBUMIN 3.2*   No results for input(s): LIPASE, AMYLASE in the last 168 hours. No results for input(s): AMMONIA in the last 168 hours. Coagulation Profile: No results for input(s):  INR, PROTIME in the last 168 hours. Cardiac Enzymes: No results for input(s): CKTOTAL, CKMB, CKMBINDEX, TROPONINI in the last 168 hours. BNP (last 3 results) No results for input(s): PROBNP in the last 8760 hours. HbA1C: No results for input(s): HGBA1C in the last 72 hours. CBG: No results for input(s): GLUCAP in the last 168 hours. Lipid Profile: No results for input(s): CHOL, HDL, LDLCALC, TRIG,  CHOLHDL, LDLDIRECT in the last 72 hours. Thyroid  Function Tests: No results for input(s): TSH, T4TOTAL, FREET4, T3FREE, THYROIDAB in the last 72 hours. Anemia Panel: No results for input(s): VITAMINB12, FOLATE, FERRITIN, TIBC, IRON, RETICCTPCT in the last 72 hours. Urine analysis:    Component Value Date/Time   COLORURINE YELLOW (A) 07/14/2024 1009   APPEARANCEUR CLEAR (A) 07/14/2024 1009   APPEARANCEUR Hazy 07/24/2012 2039   LABSPEC 1.011 07/14/2024 1009   LABSPEC 1.014 07/24/2012 2039   PHURINE 5.0 07/14/2024 1009   GLUCOSEU NEGATIVE 07/14/2024 1009   GLUCOSEU Negative 07/24/2012 2039   HGBUR NEGATIVE 07/14/2024 1009   BILIRUBINUR NEGATIVE 07/14/2024 1009   BILIRUBINUR Negative 07/24/2012 2039   KETONESUR NEGATIVE 07/14/2024 1009   PROTEINUR NEGATIVE 07/14/2024 1009   NITRITE NEGATIVE 07/14/2024 1009   LEUKOCYTESUR NEGATIVE 07/14/2024 1009   LEUKOCYTESUR Trace 07/24/2012 2039    Radiological Exams on Admission: DG Chest Portable 1 View Result Date: 11/17/2024 CLINICAL DATA:  Dyspnea EXAM: PORTABLE CHEST 1 VIEW COMPARISON:  Mar 13, 2024 FINDINGS: Stable cardiomegaly. Left subclavian Port-A-Cath is unchanged. Both lungs are clear. The visualized skeletal structures are unremarkable. IMPRESSION: No active disease. Electronically Signed   By: Lynwood Landy Raddle M.D.   On: 11/17/2024 12:12    EKG: Independently reviewed.  Sinus bradycardia, chronic RBBB, no acute ST changes.  Assessment/Plan Principal Problem:   Lower GI bleed   (please populate well all problems here in Problem List. (For example, if patient is on BP meds at home and you resume or decide to hold them, it is a problem that needs to be her. Same for CAD, COPD, HLD and so on)  Hematochezia Acute on chronic normocytic anemia secondary to GI bleed -Suspect upper GI bleed.  Multiple risk factor including low platelet count, chronic CKD with chronic uremia, Eliquis .  Reviewed patient past GI workup record from May last year showed normal EGD.  There was also a CT scan done September last year showed evidence of cirrhosis. - Last dose of Eliquis  was 3 hours ago, discussed with [facility] cardiology who performed mitral valve repair on 10/22/2024, who told me that patient does not need anticoagulation after the mitral repair procedure, told the Eliquis  is only for A-fib.  Decision made to reverse Eliquis  with Kcentra . - 1 dose of DDAVP  given for worsening of uremic bleeding - Continue PPI twice daily - D/W pharmacy, Sandostatin contra-indicated since patient is bradycardia -Ceftriaxone  for SBP prophylaxis - PRBC x 2, repeat H&H afterwards and tonight, continue further blood transfusion for drop of H&H for hemodynamic instability.  History of baseline hypotension - Continue midodrine  - Admit to PCU for close monitoring  Chronic right-sided CHF - Likely secondary related to chronic ASD which was repaired. - Hold off Bumex  due to hypotension and GI bleed - Chest x-ray appeared to be no signs of fluid overload - 1/GI bleed resolved, consider resume Bumex . - Repeat chest x-ray tomorrow  Question of cirrhosis -This was first shown on a recent CT abd in Sep. Patient not aware. -Outpatient hepatology F/U  PAF - In rate controlled A-fib with bradycardia - Hold off Eliquis , Kcentra  ordered.  Pancytopenia - Probably related to chemo - No indication for platelet transfusion at this point.  AKI on CKD stage IV Worsening of baseline uremia - AKI likely prerenal  secondary to volume depletion from GI bleed, workup and transfusion plan as above. - DDAVP  for uremic bleeding.  Colon cancer Prostate cancer - Outpatient follow-up with oncology  Critical care time  spending on this case 70 minutes  DVT prophylaxis: TED hose Code Status: Full code Family Communication: None at bedside Disposition Plan: Patient sick with complicated medical conditions with lower GI bleed complicated by chronic anticoagulation, impending shock, requiring multidiscipline care, expect more than 2 midnight hospital stay. Consults called: GI and oncology Admission status: PCU admit   Cort ONEIDA Mana MD [facility] Pager [phone]  11/17/2024, 12:29 PM        [1] No Known Allergies

## 2024-11-17 NOTE — ED Triage Notes (Signed)
 Pt presents to the ED via POV from home. Pt had an appointment at [facility] p[facility]. Reports that he has been having dark stools x3 days. Reports a hx of colon/rectal cancer. Pt had a hgb of 5 today at the cancer center. BP was 86/50.   Blue top drawn. Pt does take eliquis . Other blood work can be found under the results lab from earlier today.

## 2024-11-17 NOTE — ED Provider Notes (Signed)
 "  [facility] Provider Note    Event Date/Time   First MD Initiated Contact with Patient 11/17/24 1122     (approximate)   History   Rectal Bleeding   HPI  Chris George is a 69 y.o. male reports he came from oncology due to low blood count and low blood pressure.  For 7 days he has noticed dark black stool.  He does take Eliquis  and last took a dose this morning.  He does tell me he recently had a cardiac procedure at [facility] including evaluation of his valve  He also had a cough and some shortness of breath for a few days.  He feels like that is just not getting better no fevers or chills though.  Feeling fatigued concerned about the dark stool  No abdominal pain.  Reviewed external records from from an urgent care yesterday state Patient presents with cough, wheezing, worsening shortness of breath, unintentional weight gain for 2 days lower extremity edema and dark stools for a week.  Hemoccult is positive.  I had a lengthy discussion with the patient that this could be congestive heart failure, bronchospasm, pulmonary infection, COVID, influenza, pneumonia and that I am concerned that he has a lower GI bleed.  I strongly recommended that he go to the emergency department.  I believe he is stable to go via private vehicle as he denies chest pain, palpitations, chest pressure or heaviness, however, patient states that he does not want to go and that he will follow-up with his multiple specialists.  I discussed with him that at the ED, they could do all of the testing to sort out precisely what is happening with him so they can tailor appropriate therapy and contact his specialists from there, so that he does not have to do it, and again he refused to go.  Will have him sign out AMA.  Encouraged him to go to the ED should he change his mind.   Past Medical History:  Diagnosis Date   A-fib (HCC)    Acute kidney injury superimposed on chronic kidney disease  11/19/2023   Acute on chronic heart failure (HCC) 11/19/2023   Acute on chronic heart failure with preserved ejection fraction (HCC) 11/19/2023   Anemia    Arthritis    ankles   Cancer (HCC)    multiple myeloma   Cancer of transverse colon (HCC) 10/26/2019   Cardiomyopathy (HCC)    Chemotherapy-induced peripheral neuropathy    CHF (congestive heart failure) (HCC)    Depression    Dysrhythmia    atrial fib   Elevated troponin 11/19/2023   History of CVA (cerebrovascular accident)    HLD (hyperlipidemia)    Hypercholesteremia    Hypertension    Moderate tricuspid insufficiency    Multiple myeloma (HCC)    not being treated right now per pt 11/11/19   Pneumonia    Priapism 07/25/2012   Severe mitral regurgitation 09/13/2024   Sleep apnea    No CPAP.  OSA resolved with wt loss.     Physical Exam   Triage Vital Signs: ED Triage Vitals  Encounter Vitals Group     BP 11/17/24 1103 (!) 88/41     Girls Systolic BP Percentile --      Girls Diastolic BP Percentile --      Boys Systolic BP Percentile --      Boys Diastolic BP Percentile --      Pulse Rate 11/17/24 1103 (!) 52  Resp 11/17/24 1103 18     Temp 11/17/24 1103 98.6 F (37 C)     Temp Source 11/17/24 1103 Oral     SpO2 11/17/24 1103 100 %     Weight 11/17/24 1107 191 lb (86.6 kg)     Height 11/17/24 1107 5' 9 (1.753 m)     Head Circumference --      Peak Flow --      Pain Score 11/17/24 1103 0     Pain Loc --      Pain Education --      Exclude from Growth Chart --     Most recent vital signs: Vitals:   11/17/24 1230 11/17/24 1438  BP: (!) 103/56   Pulse: (!) 49 64  Resp: 15 17  Temp:  98 F (36.7 C)  SpO2: 100% 92%     General: Awake, no distress.  CV:   Good peripheral perfusion. Normal rate and heart tones. Resp:   Normal effort. Lung sounds clear bilateral. Speaking without distress. Abd:   No distention. Soft, non-tender to palpation in all quadrants. No rebound or guarding. Neuro:   No  focal neuro deficits noted. Moves extremities well without noted concern. Other:     ED Results / Procedures / Treatments   Labs (all labs ordered are listed, but only abnormal results are displayed) Labs Reviewed  PRO BRAIN NATRIURETIC PEPTIDE - Abnormal; Notable for the following components:      Result Value   Pro Brain Natriuretic Peptide 2,685.0 (*)    All other components within normal limits  RESP PANEL BY RT-PCR (RSV, FLU A&B, COVID)  RVPGX2  HEMOGLOBIN AND HEMATOCRIT, BLOOD  HEMOGLOBIN AND HEMATOCRIT, BLOOD  IRON AND TIBC  FERRITIN  RETICULOCYTES  POC OCCULT BLOOD, ED  TYPE AND SCREEN  PREPARE RBC (CROSSMATCH)     EKG  I independently reviewed the EKG at 1110 heart rate 60 QRS 170 QTc 550, wide-complex QRS.  Right bundle branch block like morphology.  A-fib      PROCEDURES:  Critical Care performed: Yes, see critical care procedure note(s)  CRITICAL CARE Performed by: Oneil Budge   Total critical care time: 35 minutes  Critical care time was exclusive of separately billable procedures and treating other patients.  Critical care was necessary to treat or prevent imminent or life-threatening deterioration.  Critical care was time spent personally by me on the following activities: development of treatment plan with patient and/or surrogate as well as nursing, discussions with consultants, evaluation of patient's response to treatment, examination of patient, obtaining history from patient or surrogate, ordering and performing treatments and interventions, ordering and review of laboratory studies, ordering and review of radiographic studies, pulse oximetry and re-evaluation of patient's condition.   Procedures   MEDICATIONS ORDERED IN ED: Medications  allopurinol  (ZYLOPRIM ) tablet 50 mg (has no administration in time range)  midodrine  (PROAMATINE ) tablet 10 mg (has no administration in time range)  sodium bicarbonate  tablet 650 mg (has no administration  in time range)  tamsulosin  (FLOMAX ) capsule 0.4 mg (has no administration in time range)  cyclobenzaprine  (FLEXERIL ) tablet 5 mg (has no administration in time range)  gabapentin  (NEURONTIN ) capsule 200 mg (has no administration in time range)  acetaminophen  (TYLENOL ) tablet 650 mg (has no administration in time range)    Or  acetaminophen  (TYLENOL ) suppository 650 mg (has no administration in time range)  HYDROmorphone  (DILAUDID ) injection 0.5-1 mg (has no administration in time range)  traZODone  (DESYREL ) tablet 25 mg (has  no administration in time range)  ondansetron  (ZOFRAN ) tablet 4 mg (has no administration in time range)    Or  ondansetron  (ZOFRAN ) injection 4 mg (has no administration in time range)  pantoprazole  (PROTONIX ) injection 40 mg (has no administration in time range)  cefTRIAXone  (ROCEPHIN ) 2 g in sodium chloride  0.9 % 100 mL IVPB (has no administration in time range)  aspirin  EC tablet 81 mg (has no administration in time range)  desmopressin  (DDAVP ) 20 mcg in sodium chloride  0.9 % 50 mL IVPB (has no administration in time range)  ipratropium-albuterol  (DUONEB) 0.5-2.5 (3) MG/3ML nebulizer solution 3 mL (has no administration in time range)  albuterol  (PROVENTIL ) (2.5 MG/3ML) 0.083% nebulizer solution 3 mL (has no administration in time range)  0.9 %  sodium chloride  infusion (10 mL/hr Intravenous New Bag/Given 11/17/24 1444)  pantoprazole  (PROTONIX ) injection 40 mg (40 mg Intravenous Given 11/17/24 1205)  lactated ringers  bolus 250 mL (0 mLs Intravenous Stopped 11/17/24 1225)  midodrine  (PROAMATINE ) tablet 10 mg (10 mg Oral Given 11/17/24 1205)  Prothrombin  Complex Conc Human IVPB 4,388 Units (0 Units Intravenous Stopped 11/17/24 1313)     IMPRESSION / MDM / ASSESSMENT AND PLAN / ED COURSE  I reviewed the triage vital signs and the nursing notes.                              Based on presentation, the differential diagnosis includes, but is not limited to key  considerations: 2 primary concerns first of which being dark stool hypotension and severe drop in hemoglobin.  Discussed with Dr. Jinny, given the 7-day course no indication for imaging or CT GI bleed study at this time, will withhold reversal of anticoagulation at this time pending further assessment and formal GI consult which is to occur soon.  Start PPI.  Symptoms sound to be upper GI in nature of the painless rectal bleeding.  Patient is hypotensive at this time but also being recently worked up diuresed it appears by [employer] for CHF.  Will give small volume of fluid for blood pressure support as we await blood products 2 units ordered.    Patient's presentation is most consistent with acute presentation with potential threat to life or bodily function.    The patient is on the cardiac monitor to evaluate for evidence of arrhythmia and/or significant heart rate changes.  Clinical Course as of 11/17/24 1451  Tue Nov 17, 2024  1136 Dr. Jinny coming for consult.  Discussed with patient patient agreeable and consents verbally after discussing risks benefits and alternatives for blood transfusion.  Ordered 2 units stat blood.  Presently awake alert mildly hypotensive but reports 7 days of dark stool.  He also reports a recent mitral valve evaluation or procedure at [facility] but does not have a clear detail.  I at this point have ordered blood but we will hold on reversal unless worsening or more frank bleeding concern arises.  Needs additional detail regarding the mitral valve piece (Dr. Jinny aware) [MQ]  [phone] And communication with the oncology team they advised the patient had a recent mitral valve procedure, believed to be a TEER at [facility].  They are not sure if anticoagulation should be reversed.  I also note that he should be given midodrine  as he has recently been on that and recently has been evaluated and treated for CHF exacerbation.  With this in mind we will give a small fluid bolus plan to  give midodrine , blood product ordered [MQ]  1145 Anticipate admission to hospitalist service patient understanding agreeable with this plan [MQ]  1156 BP Readings from Last 3 Encounters: 11/16/24 (!) 97/44 10/15/24 100/63 10/06/24 (!) 93/59   [MQ]    Clinical Course User Index [MQ] Dicky Anes, MD   Patient admitted to hospitalist service under care Dr. Laurita with GI consult pending  FINAL CLINICAL IMPRESSION(S) / ED DIAGNOSES   Final diagnoses:  Gastrointestinal hemorrhage with melena  Dyspnea, unspecified type     Rx / DC Orders   ED Discharge Orders     None        Note:  This document was prepared using Dragon voice recognition software and may include unintentional dictation errors.   Dicky Anes, MD 11/17/24 1451  "

## 2024-11-17 NOTE — Progress Notes (Addendum)
.  MEDICATION RELATED CONSULT NOTE - INITIAL   Pharmacy Consult for Kcentra  monitoring Indication: DOAC (apixaban ) reversal  Allergies[1]  Patient Measurements: Height: 5' 9 (175.3 cm) Weight: 86.6 kg (191 lb) IBW/kg (Calculated) : 70.7 Adjusted Body Weight:   Vital Signs: Temp: 98.6 F (37 C) (01/13 1103) Temp Source: Oral (01/13 1103) BP: 88/41 (01/13 1103) Pulse Rate: 52 (01/13 1103) Intake/Output from previous day: No intake/output data recorded. Intake/Output from this shift: No intake/output data recorded.  Labs: Recent Labs    11/17/24 1002  WBC 2.1*  HGB 5.0*  HCT 16.3*  PLT 94*  CREATININE 3.51*  MG 2.1  ALBUMIN 3.2*  PROT 6.2*  AST 32  ALT 17  ALKPHOS 286*  BILITOT 0.4   Estimated Creatinine Clearance: 22 mL/min (A) (by C-G formula based on SCr of 3.51 mg/dL (H)).   Microbiology: No results found for this or any previous visit (from the past 720 hours).  Medical History: Past Medical History:  Diagnosis Date   A-fib (HCC)    Acute kidney injury superimposed on chronic kidney disease 11/19/2023   Acute on chronic heart failure (HCC) 11/19/2023   Acute on chronic heart failure with preserved ejection fraction (HCC) 11/19/2023   Anemia    Arthritis    ankles   Cancer (HCC)    multiple myeloma   Cancer of transverse colon (HCC) 10/26/2019   Cardiomyopathy (HCC)    Chemotherapy-induced peripheral neuropathy    CHF (congestive heart failure) (HCC)    Depression    Dysrhythmia    atrial fib   Elevated troponin 11/19/2023   History of CVA (cerebrovascular accident)    HLD (hyperlipidemia)    Hypercholesteremia    Hypertension    Moderate tricuspid insufficiency    Multiple myeloma (HCC)    not being treated right now per pt 11/11/19   Pneumonia    Priapism 07/25/2012   Severe mitral regurgitation 09/13/2024   Sleep apnea    No CPAP.  OSA resolved with wt loss.    Medications:  PTA apixaban  5 mg BID (last dose 1/13  AM)  Assessment: Chris George is a 40 yoM presenting from an oncology appointment due to low blood count and low blood pressure. Has reported dark stools for the past week. Takes Eliquis  due to atrial fibrillation history.  Patient with concerns for now for lower GI bleed. Kcentra  has already been ordered.  Hgb 5.0, plt 94 (appears to be ~baseline), BUN 92, Scr 3.51 with BP 88/41 and pulse 52 on admission.  Plan:  No additional monitoring by pharmacy required  Chris George 11/17/2024,12:19 PM       [1] No Known Allergies

## 2024-11-17 NOTE — Progress Notes (Signed)
 Notified Chris Dawn, NP of patients low BP of 86/29 left arm and 82/50 in the right arm.  Lauren states Hgb is 5 and she will discuss transport to the ED.  Took patient via wheelchair to the ED.

## 2024-11-18 ENCOUNTER — Inpatient Hospital Stay

## 2024-11-18 ENCOUNTER — Inpatient Hospital Stay: Admitting: Anesthesiology

## 2024-11-18 ENCOUNTER — Encounter: Payer: Self-pay | Admitting: Internal Medicine

## 2024-11-18 ENCOUNTER — Encounter: Admission: EM | Payer: Self-pay | Source: Home / Self Care | Attending: Internal Medicine

## 2024-11-18 DIAGNOSIS — K317 Polyp of stomach and duodenum: Secondary | ICD-10-CM | POA: Diagnosis not present

## 2024-11-18 DIAGNOSIS — K31811 Angiodysplasia of stomach and duodenum with bleeding: Secondary | ICD-10-CM | POA: Diagnosis not present

## 2024-11-18 DIAGNOSIS — K31819 Angiodysplasia of stomach and duodenum without bleeding: Secondary | ICD-10-CM

## 2024-11-18 DIAGNOSIS — K921 Melena: Secondary | ICD-10-CM | POA: Diagnosis not present

## 2024-11-18 DIAGNOSIS — K922 Gastrointestinal hemorrhage, unspecified: Secondary | ICD-10-CM | POA: Diagnosis not present

## 2024-11-18 DIAGNOSIS — K5520 Angiodysplasia of colon without hemorrhage: Secondary | ICD-10-CM

## 2024-11-18 HISTORY — PX: GIVENS CAPSULE STUDY: SHX5432

## 2024-11-18 HISTORY — PX: COLONOSCOPY: SHX5424

## 2024-11-18 HISTORY — PX: ESOPHAGOGASTRODUODENOSCOPY: SHX5428

## 2024-11-18 LAB — COMPREHENSIVE METABOLIC PANEL WITH GFR
ALT: 15 U/L (ref 0–44)
AST: 27 U/L (ref 15–41)
Albumin: 3.1 g/dL — ABNORMAL LOW (ref 3.5–5.0)
Alkaline Phosphatase: 260 U/L — ABNORMAL HIGH (ref 38–126)
Anion gap: 11 (ref 5–15)
BUN: 94 mg/dL — ABNORMAL HIGH (ref 8–23)
CO2: 27 mmol/L (ref 22–32)
Calcium: 8.3 mg/dL — ABNORMAL LOW (ref 8.9–10.3)
Chloride: 98 mmol/L (ref 98–111)
Creatinine, Ser: 3.7 mg/dL — ABNORMAL HIGH (ref 0.61–1.24)
GFR, Estimated: 17 mL/min — ABNORMAL LOW
Glucose, Bld: 130 mg/dL — ABNORMAL HIGH (ref 70–99)
Potassium: 4.2 mmol/L (ref 3.5–5.1)
Sodium: 136 mmol/L (ref 135–145)
Total Bilirubin: 1.5 mg/dL — ABNORMAL HIGH (ref 0.0–1.2)
Total Protein: 6 g/dL — ABNORMAL LOW (ref 6.5–8.1)

## 2024-11-18 LAB — CBC
HCT: 19.6 % — ABNORMAL LOW (ref 39.0–52.0)
HCT: 21.1 % — ABNORMAL LOW (ref 39.0–52.0)
Hemoglobin: 6.2 g/dL — ABNORMAL LOW (ref 13.0–17.0)
Hemoglobin: 6.8 g/dL — ABNORMAL LOW (ref 13.0–17.0)
MCH: 29.2 pg (ref 26.0–34.0)
MCH: 29.4 pg (ref 26.0–34.0)
MCHC: 31.6 g/dL (ref 30.0–36.0)
MCHC: 32.2 g/dL (ref 30.0–36.0)
MCV: 92.5 fL (ref 80.0–100.0)
MCV: 91.3 fL (ref 80.0–100.0)
Platelets: 105 K/uL — ABNORMAL LOW (ref 150–400)
Platelets: 102 K/uL — ABNORMAL LOW (ref 150–400)
RBC: 2.12 MIL/uL — ABNORMAL LOW (ref 4.22–5.81)
RBC: 2.31 MIL/uL — ABNORMAL LOW (ref 4.22–5.81)
RDW: 17.3 % — ABNORMAL HIGH (ref 11.5–15.5)
RDW: 17.4 % — ABNORMAL HIGH (ref 11.5–15.5)
WBC: 2.5 K/uL — ABNORMAL LOW (ref 4.0–10.5)
WBC: 4.4 K/uL (ref 4.0–10.5)
nRBC: 0 % (ref 0.0–0.2)
nRBC: 0 % (ref 0.0–0.2)

## 2024-11-18 LAB — MRSA NEXT GEN BY PCR, NASAL: MRSA by PCR Next Gen: NOT DETECTED

## 2024-11-18 LAB — IRON AND TIBC
Iron: 214 ug/dL — ABNORMAL HIGH (ref 45–182)
Saturation Ratios: 79 % — ABNORMAL HIGH (ref 17.9–39.5)
TIBC: 270 ug/dL (ref 250–450)
UIBC: 56 ug/dL

## 2024-11-18 LAB — BASIC METABOLIC PANEL WITH GFR
Anion gap: 14 (ref 5–15)
BUN: 91 mg/dL — ABNORMAL HIGH (ref 8–23)
CO2: 23 mmol/L (ref 22–32)
Calcium: 8.2 mg/dL — ABNORMAL LOW (ref 8.9–10.3)
Chloride: 96 mmol/L — ABNORMAL LOW (ref 98–111)
Creatinine, Ser: 3.57 mg/dL — ABNORMAL HIGH (ref 0.61–1.24)
GFR, Estimated: 18 mL/min — ABNORMAL LOW
Glucose, Bld: 114 mg/dL — ABNORMAL HIGH (ref 70–99)
Potassium: 4 mmol/L (ref 3.5–5.1)
Sodium: 133 mmol/L — ABNORMAL LOW (ref 135–145)

## 2024-11-18 LAB — PREPARE RBC (CROSSMATCH)

## 2024-11-18 LAB — GLUCOSE, CAPILLARY: Glucose-Capillary: 111 mg/dL — ABNORMAL HIGH (ref 70–99)

## 2024-11-18 LAB — LACTIC ACID, PLASMA
Lactic Acid, Venous: 1.7 mmol/L (ref 0.5–1.9)
Lactic Acid, Venous: 1.5 mmol/L (ref 0.5–1.9)

## 2024-11-18 LAB — RETICULOCYTES
Immature Retic Fract: 24.5 % — ABNORMAL HIGH (ref 2.3–15.9)
RBC.: 2.11 MIL/uL — ABNORMAL LOW (ref 4.22–5.81)
Retic Count, Absolute: 106.1 K/uL (ref 19.0–186.0)
Retic Ct Pct: 5 % — ABNORMAL HIGH (ref 0.4–3.1)

## 2024-11-18 LAB — CEA: CEA: 934 ng/mL — ABNORMAL HIGH (ref 0.0–4.7)

## 2024-11-18 LAB — HEMOGLOBIN AND HEMATOCRIT, BLOOD
HCT: 20 % — ABNORMAL LOW (ref 39.0–52.0)
Hemoglobin: 6.3 g/dL — ABNORMAL LOW (ref 13.0–17.0)

## 2024-11-18 LAB — PROTIME-INR
INR: 1.5 — ABNORMAL HIGH (ref 0.8–1.2)
Prothrombin Time: 18.8 s — ABNORMAL HIGH (ref 11.4–15.2)

## 2024-11-18 LAB — FERRITIN: Ferritin: 1307 ng/mL — ABNORMAL HIGH (ref 24–336)

## 2024-11-18 MED ORDER — PHENYLEPHRINE 80 MCG/ML (10ML) SYRINGE FOR IV PUSH (FOR BLOOD PRESSURE SUPPORT)
PREFILLED_SYRINGE | INTRAVENOUS | Status: DC | PRN
  Administered 2024-11-18 (×2): 240 ug via INTRAVENOUS
  Administered 2024-11-18: 320 ug via INTRAVENOUS

## 2024-11-18 MED ORDER — FUROSEMIDE 10 MG/ML IJ SOLN
10.0000 mg | Freq: Once | INTRAMUSCULAR | Status: AC
  Administered 2024-11-18: 10 mg via INTRAVENOUS

## 2024-11-18 MED ORDER — IPRATROPIUM-ALBUTEROL 0.5-2.5 (3) MG/3ML IN SOLN
3.0000 mL | Freq: Four times a day (QID) | RESPIRATORY_TRACT | Status: DC
  Administered 2024-11-18: 3 mL via RESPIRATORY_TRACT
  Filled 2024-11-18 (×2): qty 3

## 2024-11-18 MED ORDER — SODIUM CHLORIDE 0.9 % IV BOLUS: 500.0000 mL | Freq: Once | INTRAVENOUS | Status: DC

## 2024-11-18 MED ORDER — PHENYLEPHRINE HCL-NACL 20-0.9 MG/250ML-% IV SOLN
0.0000 ug/min | INTRAVENOUS | Status: DC
  Administered 2024-11-18: 20 ug/min via INTRAVENOUS
  Filled 2024-11-18: qty 250

## 2024-11-18 MED ORDER — PHENYLEPHRINE HCL-NACL 20-0.9 MG/250ML-% IV SOLN
0.0000 ug/min | INTRAVENOUS | Status: DC
  Administered 2024-11-19: 40 ug/min via INTRAVENOUS
  Administered 2024-11-19: 10 ug/min via INTRAVENOUS
  Filled 2024-11-18 (×2): qty 250

## 2024-11-18 MED ORDER — ALBUTEROL SULFATE (2.5 MG/3ML) 0.083% IN NEBU: 2.5000 mg | INHALATION_SOLUTION | Freq: Once | RESPIRATORY_TRACT | Status: DC

## 2024-11-18 MED ORDER — LIDOCAINE HCL (PF) 2 % IJ SOLN
INTRAMUSCULAR | Status: AC
  Filled 2024-11-18: qty 5

## 2024-11-18 MED ORDER — IPRATROPIUM-ALBUTEROL 0.5-2.5 (3) MG/3ML IN SOLN
RESPIRATORY_TRACT | Status: AC
  Filled 2024-11-18: qty 3

## 2024-11-18 MED ORDER — LIDOCAINE HCL (CARDIAC) PF 100 MG/5ML IV SOSY
PREFILLED_SYRINGE | INTRAVENOUS | Status: DC | PRN
  Administered 2024-11-18: 80 mg via INTRAVENOUS

## 2024-11-18 MED ORDER — SODIUM CHLORIDE 0.9 % IV SOLN: INTRAVENOUS | Status: DC

## 2024-11-18 MED ORDER — SODIUM CHLORIDE 0.9% IV SOLUTION
Freq: Once | INTRAVENOUS | Status: AC
  Filled 2024-11-18: qty 250

## 2024-11-18 MED ORDER — DEXMEDETOMIDINE HCL IN NACL 80 MCG/20ML IV SOLN
INTRAVENOUS | Status: AC
  Filled 2024-11-18: qty 20

## 2024-11-18 MED ORDER — IPRATROPIUM-ALBUTEROL 0.5-2.5 (3) MG/3ML IN SOLN
3.0000 mL | Freq: Once | RESPIRATORY_TRACT | Status: AC
  Administered 2024-11-18: 3 mL via RESPIRATORY_TRACT

## 2024-11-18 MED ORDER — PROPOFOL 500 MG/50ML IV EMUL
INTRAVENOUS | Status: DC | PRN
  Administered 2024-11-18: 50 ug/kg/min via INTRAVENOUS

## 2024-11-18 MED ORDER — GLYCOPYRROLATE 0.2 MG/ML IJ SOLN
INTRAMUSCULAR | Status: AC
  Filled 2024-11-18: qty 1

## 2024-11-18 MED ORDER — GLYCOPYRROLATE 0.2 MG/ML IJ SOLN
INTRAMUSCULAR | Status: DC | PRN
  Administered 2024-11-18: .2 mg via INTRAVENOUS

## 2024-11-18 MED ORDER — PROPOFOL 1000 MG/100ML IV EMUL
INTRAVENOUS | Status: AC
  Filled 2024-11-18: qty 100

## 2024-11-18 MED ORDER — EPHEDRINE SULFATE-NACL 50-0.9 MG/10ML-% IV SOSY
PREFILLED_SYRINGE | INTRAVENOUS | Status: DC | PRN
  Administered 2024-11-18: 10 mg via INTRAVENOUS
  Administered 2024-11-18: 15 mg via INTRAVENOUS

## 2024-11-18 MED ORDER — PROPOFOL 10 MG/ML IV BOLUS
INTRAVENOUS | Status: DC | PRN
  Administered 2024-11-18: 50 mg via INTRAVENOUS
  Administered 2024-11-18: 20 mg via INTRAVENOUS
  Administered 2024-11-18: 30 mg via INTRAVENOUS

## 2024-11-18 MED ORDER — CHLORHEXIDINE GLUCONATE CLOTH 2 % EX PADS
6.0000 | MEDICATED_PAD | Freq: Every day | CUTANEOUS | Status: DC
  Administered 2024-11-18 – 2024-11-19 (×2): 6 via TOPICAL

## 2024-11-18 MED ORDER — METHYLPREDNISOLONE SODIUM SUCC 40 MG IJ SOLR
40.0000 mg | Freq: Every day | INTRAMUSCULAR | Status: DC
  Administered 2024-11-18 – 2024-11-20 (×3): 40 mg via INTRAVENOUS
  Filled 2024-11-18 (×3): qty 1

## 2024-11-18 MED ORDER — EPHEDRINE 5 MG/ML INJ
INTRAVENOUS | Status: AC
  Filled 2024-11-18: qty 5

## 2024-11-18 NOTE — ED Notes (Signed)
 Pt ABCs intact. RR even and unlabored. Pt in NAD. Bed in lowest locked position. Call bell in reach. Denies needs at this time.    Past Medical History:  Diagnosis Date   A-fib (HCC)    Acute kidney injury superimposed on chronic kidney disease 11/19/2023   Acute on chronic heart failure (HCC) 11/19/2023   Acute on chronic heart failure with preserved ejection fraction (HCC) 11/19/2023   Anemia    Arthritis    ankles   Cancer (HCC)    multiple myeloma   Cancer of transverse colon (HCC) 10/26/2019   Cardiomyopathy (HCC)    Chemotherapy-induced peripheral neuropathy    CHF (congestive heart failure) (HCC)    Depression    Dysrhythmia    atrial fib   Elevated troponin 11/19/2023   History of CVA (cerebrovascular accident)    HLD (hyperlipidemia)    Hypercholesteremia    Hypertension    Moderate tricuspid insufficiency    Multiple myeloma (HCC)    not being treated right now per pt 11/11/19   Pneumonia    Priapism 07/25/2012   Severe mitral regurgitation 09/13/2024   Sleep apnea    No CPAP.  OSA resolved with wt loss.

## 2024-11-18 NOTE — Op Note (Signed)
 [facility] Gastroenterology Patient Name: Chris George Procedure Date: 11/18/2024 12:40 PM MRN: [id number] Account #: [id number] Date of Birth: [birth year] Admit Type: Inpatient Age: 69 Room: [facility] ENDO ROOM 2 Gender: Male Note Status: Finalized Instrument Name: Endoscope [id number] Procedure:             Upper GI endoscopy Indications:           Melena Providers:             Rogelia Copping MD, MD Medicines:             Propofol  per Anesthesia Complications:         No immediate complications. Procedure:             Pre-Anesthesia Assessment:                        - Prior to the procedure, a History and Physical was                         performed, and patient medications and allergies were                         reviewed. The patient's tolerance of previous                         anesthesia was also reviewed. The risks and benefits                         of the procedure and the sedation options and risks                         were discussed with the patient. All questions were                         answered, and informed consent was obtained. Prior                         Anticoagulants: The patient has taken no anticoagulant                         or antiplatelet agents. ASA Grade Assessment: II - A                         patient with mild systemic disease. After reviewing                         the risks and benefits, the patient was deemed in                         satisfactory condition to undergo the procedure.                        After obtaining informed consent, the endoscope was                         passed under direct vision. Throughout the procedure,                         the patient's blood pressure, pulse,  and oxygen                         saturations were monitored continuously. The Endoscope                         was introduced through the mouth, and advanced to the                         third part of duodenum. The upper GI  endoscopy was                         accomplished without difficulty. The patient tolerated                         the procedure well. Findings:      The examined esophagus was normal.      A few sessile polyps with no bleeding and no stigmata of recent bleeding       were found in the gastric fundus.      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 Coagulation for destruction of remaining portion of lesion using       argon plasma at 2 liters/minute and 20 watts was successful.      A few angiodysplastic lesions with stigmata of recent bleeding were       found in the second portion of the duodenum. Coagulation for hemostasis       using argon plasma at 2 liters/minute and 20 watts was successful.      Using the endoscope, the video capsule enteroscope was advanced into the       duodenal bulb. Impression:            - Normal esophagus.                        - A few gastric polyps.                        - Two non-bleeding angiodysplastic lesions in the                         stomach. Treated with argon plasma coagulation (APC).                        - A few recently bleeding angiodysplastic lesions in                         the duodenum. Treated with argon plasma coagulation                         (APC).                        - Successful completion of the Video Capsule                         Enteroscope placement.                        - No specimens collected. Recommendation:        -  Return patient to hospital ward for ongoing care.                        - Resume previous diet.                        - Continue present medications. Procedure Code(s):     --- Professional ---                        [phone], Esophagogastroduodenoscopy, flexible,                         transoral; with ablation of tumor(s), polyp(s), or                         other lesion(s) (includes pre- and post-dilation and                         guide wire  passage, when performed)                        [id number], 59, Esophagogastroduodenoscopy, flexible,                         transoral; with control of bleeding, any method Diagnosis Code(s):     --- Professional ---                        K92.1, Melena (includes Hematochezia) CPT copyright 2022 American Medical Association. All rights reserved. The codes documented in this report are preliminary and upon coder review may  be revised to meet current compliance requirements. Rogelia Copping MD, MD 11/18/2024 2:28:53 PM This report has been signed electronically. Number of Addenda: 0 Note Initiated On: 11/18/2024 12:40 PM Estimated Blood Loss:  Estimated blood loss: none.      [facility]

## 2024-11-18 NOTE — Progress Notes (Signed)
" ° ° °  PROCEDURAL EXPEDITER PROGRESS NOTE  Patient Name: JASIYAH PAULDING  DOB:[birth year] Date of Admission: 11/17/2024  Date of Assessment:11/18/2024   -------------------------------------------------------------------------------------------------------------------   Brief clinical summary: 69 yr old male with Hx ASD post closure, chronic right sided CHF, PAF on eliquis ,  chronic hypotension on midodrine  mitral reurg status post repair in 10/2024, colon cancerpost partial colectomyon chemo, CKD stage 4 and GERD.  Came to hospital with black tarry stool and having EGD today   Orders in place:  Yes   Communication with surgical team if no orders: n/a  Labs, test, and orders reviewed: yes  Requires surgical clearance:  No  What type of clearance: n/a  Clearance received: n/a  Barriers noted:n/a   Intervention provided by [facility] team: n/a  Barrier resolved:  not applicable   -------------------------------------------------------------------------------------------------------------------  [employer], Ronal DELENA Bald Please contact us  directly via secure chat (search for [facility]) or by calling us  at [phone] [facility]).  "

## 2024-11-18 NOTE — Transfer of Care (Signed)
 Immediate Anesthesia Transfer of Care Note  Patient: Chris George  Procedure(s) Performed: EGD (ESOPHAGOGASTRODUODENOSCOPY) COLONOSCOPY, WITH ARGON PLASMA COAGULATION  Patient Location: PACU  Anesthesia Type:General  Level of Consciousness: sedated  Airway & Oxygen Therapy: Patient Spontanous Breathing  Post-op Assessment: Report given to RN and Post -op Vital signs reviewed and stable  Post vital signs: Reviewed and stable  Last Vitals:  Vitals Value Taken Time  BP 89/49 11/18/24 14:30  Temp    Pulse 70 11/18/24 14:30  Resp 19 11/18/24 14:30  SpO2 99 % 11/18/24 14:30  Vitals shown include unfiled device data.  Last Pain:  Vitals:   11/18/24 1322  TempSrc: Temporal  PainSc: 0-No pain         Complications: No notable events documented.

## 2024-11-18 NOTE — Anesthesia Preprocedure Evaluation (Addendum)
 "                                  Anesthesia Evaluation  Patient identified by MRN, date of birth, ID band Patient awake    Reviewed: Allergy & Precautions, NPO status , Patient's Chart, lab work & pertinent test results  Airway Mallampati: II  TM Distance: >3 FB Neck ROM: Full    Dental  (+) Teeth Intact   Pulmonary neg pulmonary ROS, sleep apnea    Pulmonary exam normal        Cardiovascular Exercise Tolerance: Poor hypertension, Pt. on medications +CHF  Normal cardiovascular exam+ dysrhythmias  Rhythm:Regular     Neuro/Psych    Depression    CVA, Residual Symptoms negative neurological ROS  negative psych ROS   GI/Hepatic negative GI ROS, Neg liver ROS,,,  Endo/Other  negative endocrine ROSdiabetes    Renal/GU CRFRenal disease  negative genitourinary   Musculoskeletal  (+) Arthritis ,    Abdominal Normal abdominal exam  (+)   Peds negative pediatric ROS (+)  Hematology negative hematology ROS (+) Blood dyscrasia, anemia   Anesthesia Other Findings Past Medical History: No date: A-fib (HCC) 11/19/2023: Acute kidney injury superimposed on chronic kidney disease 11/19/2023: Acute on chronic heart failure (HCC) 11/19/2023: Acute on chronic heart failure with preserved ejection  fraction (HCC) No date: Anemia No date: Arthritis     Comment:  ankles No date: Cancer (HCC)     Comment:  multiple myeloma 10/26/2019: Cancer of transverse colon (HCC) No date: Cardiomyopathy (HCC) No date: Chemotherapy-induced peripheral neuropathy No date: CHF (congestive heart failure) (HCC) No date: Depression No date: Dysrhythmia     Comment:  atrial fib 11/19/2023: Elevated troponin No date: History of CVA (cerebrovascular accident) No date: HLD (hyperlipidemia) No date: Hypercholesteremia No date: Hypertension No date: Moderate tricuspid insufficiency No date: Multiple myeloma (HCC)     Comment:  not being treated right now per pt 11/11/19 No date:  Pneumonia 07/25/2012: Priapism 09/13/2024: Severe mitral regurgitation No date: Sleep apnea     Comment:  No CPAP.  OSA resolved with wt loss.  Past Surgical History: No date: ATRIAL ABLATION SURGERY No date: CARDIAC SURGERY     Comment:  A-Fib Ablations 03/17/2024: COLONOSCOPY; N/A     Comment:  Procedure: COLONOSCOPY;  Surgeon: Jinny Carmine, MD;                Location: [facility] ENDOSCOPY;  Service: Endoscopy;                Laterality: N/A; 10/26/2019: COLONOSCOPY WITH PROPOFOL ; N/A     Comment:  Procedure: COLONOSCOPY WITH BIOPSY;  Surgeon: Jinny Carmine, MD;  Location: [facility] SURGERY CNTR;  Service:               Endoscopy;  Laterality: N/A; 09/03/2022: COLONOSCOPY WITH PROPOFOL ; N/A     Comment:  Procedure: COLONOSCOPY WITH PROPOFOL ;  Surgeon: Jinny Carmine, MD;  Location: [facility] SURGERY CN[facility];  Service:               Endoscopy;  Laterality: N/A; 03/17/2024: ESOPHAGOGASTRODUODENOSCOPY; N/A     Comment:  Procedure: GI Bleeding;  Surgeon: Jinny Carmine, MD;                Location:  [facility] ENDOSCOPY;  Service: Endoscopy;                Laterality: N/A;  GI Bleeding, EGD 10/26/2019: ESOPHAGOGASTRODUODENOSCOPY (EGD) WITH PROPOFOL ; N/A     Comment:  Procedure: ESOPHAGOGASTRODUODENOSCOPY (EGD) WITH BIOPSY;              Surgeon: Jinny Carmine, MD;  Location: [facility] SURGERY               CNTR;  Service: Endoscopy;  Laterality: N/A;  sleep apnea 11/17/2019: LAPAROSCOPIC PARTIAL COLECTOMY; Right     Comment:  Procedure: LAPAROSCOPIC PARTIAL COLECTOMY RIGHT               EXTENDED;  Surgeon: Desiderio Schanz, MD;  Location: [facility]               ORS;  Service: General;  Laterality: Right; 12/28/2019: PORTACATH PLACEMENT; N/A     Comment:  Procedure: INSERTION PORT-A-CATH Left subclavian;                Surgeon: Desiderio Schanz, MD;  Location: [facility] ORS;                Service: General;  Laterality: N/A;  BMI    Body Mass Index: 28.21 kg/m       Reproductive/Obstetrics negative OB ROS                              Anesthesia Physical Anesthesia Plan  ASA: 3  Anesthesia Plan: General   Post-op Pain Management:    Induction: Intravenous  PONV Risk Score and Plan: Propofol  infusion and TIVA  Airway Management Planned: Natural Airway and Nasal Cannula  Additional Equipment:   Intra-op Plan:   Post-operative Plan:   Informed Consent: I have reviewed the patients History and Physical, chart, labs and discussed the procedure including the risks, benefits and alternatives for the proposed anesthesia with the patient or authorized representative who has indicated his/her understanding and acceptance.     Dental Advisory Given  Plan Discussed with: CRNA  Anesthesia Plan Comments:          Anesthesia Quick Evaluation  "

## 2024-11-18 NOTE — Consult Note (Signed)
 "  NAME:  CLIFTON SAFLEY, MRN:  [id number], DOB:  [birth year], LOS: 1 ADMISSION DATE:  11/17/2024, CONSULTATION DATE:  11/18/2024 REFERRING MD:  Dr. Jinny, CHIEF COMPLAINT:  Hypotension    Brief Pt Description / Synopsis:  69 y.o. male with PMHx most significant for right sided CHF, A. Fib on Eliquis , colon and prostrate cancer status chemo and radiation therapy, and multiple myeloma admitted with Acute Blood Loss Anemia due to Acute Upper GI Bleed, along with AKI.  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  Course complicated by hypotension requiring vasopressors.   History of Present Illness:  NYLE LIMB is a 70 y.o. male with medical history significant of ASD status post closure, chronic right-sided CHF, PAF on Eliquis , chronic hypotension on midodrine , mitral regurgitation status post repair in December 2025, colon cancer status post partial colectomy, on chemotherapy, prostate cancer status post radiation therapy, smoldering multiple myeloma, chronic anemia, CKD stage IV, GERD presented with 7 days of black tarry stool and fatigue generalized weakness.   Symptoms started 7 days ago, patient started noticed dark-colored stool 1-2 episodes a day, increasing has been feeling fatigue and general weakness but denied any shortness of breath no chest pains or lightheadedness.  Yesterday, patient started to feel increasing shortness of breath and came to ED, but before workup was done patient signed AMA.  This morning he came back again for persistent feeling of fatigue and shortness of breath.   ED Course: Initial Vital Signs:  Afebrile, bradycardia blood pressure 90/40, O2 saturation 100% on room air.  Significant Labs:  hemoglobin 5.0 compared to baseline 9.0-10, WBC 2.1 platelet 94 BUN 92 creatinine 3.5 compared to baseline 2.4-2.8  glucose 130 bicarb 25K 3.7. Imaging Chest X-ray>>cardiomegaly but no acute infiltrates  Medications Administered:  PRBC x 2 ordered. Patient was given PPI x 1 in the ED.   [facility] asked to admit for further workup and treatment.  GI was consulted.   Please see Significant Hospital Events section below for full detailed hospital course.   Pertinent  Medical History   Past Medical History:  Diagnosis Date   A-fib (HCC)    Acute kidney injury superimposed on chronic kidney disease 11/19/2023   Acute on chronic heart failure (HCC) 11/19/2023   Acute on chronic heart failure with preserved ejection fraction (HCC) 11/19/2023   Anemia    Arthritis    ankles   Cancer (HCC)    multiple myeloma   Cancer of transverse colon (HCC) 10/26/2019   Cardiomyopathy (HCC)    Chemotherapy-induced peripheral neuropathy    CHF (congestive heart failure) (HCC)    Depression    Dysrhythmia    atrial fib   Elevated troponin 11/19/2023   History of CVA (cerebrovascular accident)    HLD (hyperlipidemia)    Hypercholesteremia    Hypertension    Moderate tricuspid insufficiency    Multiple myeloma (HCC)    not being treated right now per pt 11/11/19   Pneumonia    Priapism 07/25/2012   Severe mitral regurgitation 09/13/2024   Sleep apnea    No CPAP.  OSA resolved with wt loss.    Micro Data:  1/13: COVID/flu/RSV PCR>> negative 1/14: MRSA PCR>>  Antimicrobials:   Anti-infectives (From admission, onward)    Start     Dose/Rate Route Frequency Ordered Stop   11/17/24 1400  [  MAR Hold]  cefTRIAXone  (ROCEPHIN ) 2 g in sodium chloride  0.9 % 100 mL IVPB        (MAR Hold since Wed 11/18/2024 at 1312.Hold Reason: Transfer to a Procedural area)   2 g 200 mL/hr over 30 Minutes Intravenous Every 24 hours 11/17/24 1228         Significant Hospital Events: Including procedures, antibiotic start and stop dates in addition to other pertinent events   1/13: Admitted by TRH for acute GI Bleed and AKI.   Ordered for 2 units pRBC's. GI consulted. 1/14: Ordered for 1 additional unit of blood. Taken for EGD.  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  Hypotensive requiring vasopressors, transferred to ICU.  PCCM consulted. Consult Nephrology for AKI.  Interim History / Subjective:  As outlined above under Significant Hospital Events section  Objective   Blood pressure (!) 99/50, pulse 62, temperature 98.1 F (36.7 C), temperature source Temporal, resp. rate 18, height 5' 9 (1.753 m), weight 86.6 kg, SpO2 97%.        Intake/Output Summary (Last 24 hours) at 11/18/2024 1750 Last data filed at 11/18/2024 1730 Gross per 24 hour  Intake 850 ml  Output 475 ml  Net 375 ml   Filed Weights   11/17/24 1107  Weight: 86.6 kg    Examination: General: Acute on chronically ill-appearing male, laying in bed, on 2 L nasal cannula, with mildly increased work of breathing HENT: Atraumatic, normocephalic, neck supple, no JVD Lungs: Inspiratory and expiratory wheezing throughout, even, mild tachypnea Cardiovascular: Regular rate and rhythm, S1-S2, no murmurs rubs, gallops Abdomen: Soft, nontender, nondistended, no guarding, tenderness, bowel sounds positive x4 Extremities: Normal bulk and tone, no deformities, 1+ edema bilateral lower extremities Neuro: Awake and alert, oriented x 4, moves all extremities to commands, no focal deficits, from prior TIA with difficulty finding words GU: Deferred  Resolved Hospital Problem list     Assessment & Plan:   #Shock: Hypovolemic/Hemorrhagic  #Chronic Right sided CHF PMHx: Hypotension, PAF on Eliquis  Echocardiogram 06/15/24: LVEF 55%, unable to assess diastolic function,  severe mitral regurgitation, RV severely dilated with normal systolic function, moderate to severe tricuspid regurgitation, moderate pulmonary  hypertension, mild to moderate pulmonic regurgitation -Continuous cardiac monitoring -Maintain MAP >65 -Cautious IV fluids -Transfusions as indicated -Vasopressors as needed to maintain MAP goal -Trend lactic acid until normalized -Trend HS Troponin until peaked -Echocardiogram pending -Diuresis as BP and renal function permits ~ holding now due to shock and AKI  #Acute Blood Loss Anemia due to ... #Acute GI Bleed (Hematochezia) #Pancytopenia, suspect due to chemo PMHx: Colon and prostrate cancer Status post EGD on 1/14: A few gastric polyps. Two non-bleeding angiodysplastic lesions in the stomach. Treated with argon plasma coagulation (APC). A few recently bleeding angiodysplastic lesions in the duodenum. Treated with argon plasma coagulation (APC). -Monitor for S/Sx of bleeding -Trend CBC -SCD's for VTE Prophylaxis  -Transfuse for Hgb <7 -Transfuse Platelets for Platelet count < 10K; < 50K with active bleeding; < 100K with Neurosurgical procedures/processes  -GI following, appreciate input -Follow up with Oncology outpatient   #Acute Hypoxic Respiratory Failure, suspect due to pulmonary edema -Supplemental O2 as needed to maintain O2 sats >92% -BiPAP if needed -Follow intermittent Chest X-ray & ABG as needed -Bronchodilators -IV Steroids -Diuresis as BP and renal function permits ~ holding for now due to shock and AKI -Pulmonary toilet  as able  #AKI on CKD Stage IV -Monitor I&O's / urinary output -Follow BMP -Ensure adequate renal perfusion -Avoid nephrotoxic agents as able -Replace electrolytes as indicated ~ Pharmacy following for assistance with electrolyte replacement -Consult Nephrology, appreciate input          Best Practice (right click and Reselect all SmartList Selections daily)   Diet/type: NPO DVT prophylaxis: SCD GI prophylaxis: PPI Lines: Left Chest port accessed, and is still needed Foley:  N/A Code Status:  full code Last date of  multidisciplinary goals of care discussion [N/A]  1/14: Pt updated at bedside on plan of care.  Labs   CBC: Recent Labs  Lab 11/17/24 1002 11/17/24 2049 11/18/24 0405 11/18/24 0735  WBC 2.1*  --  2.5*  --   NEUTROABS 1.3*  --   --   --   HGB 5.0* 5.3* 6.2* 6.3*  HCT 16.3* 17.1* 19.6* 20.0*  MCV 93.1  --  92.5  --   PLT 94*  --  105*  --     Basic Metabolic Panel: Recent Labs  Lab 11/17/24 1002 11/18/24 0405  NA 135 136  K 3.7 4.2  CL 98 98  CO2 25 27  GLUCOSE 130* 130*  BUN 92* 94*  CREATININE 3.51* 3.70*  CALCIUM  8.5* 8.3*  MG 2.1  --    GFR: Estimated Creatinine Clearance: 20.8 mL/min (A) (by C-G formula based on SCr of 3.7 mg/dL (H)). Recent Labs  Lab 11/17/24 1002 11/18/24 0405  WBC 2.1* 2.5*    Liver Function Tests: Recent Labs  Lab 11/17/24 1002 11/18/24 0405  AST 32 27  ALT 17 15  ALKPHOS 286* 260*  BILITOT 0.4 1.5*  PROT 6.2* 6.0*  ALBUMIN 3.2* 3.1*   No results for input(s): LIPASE, AMYLASE in the last 168 hours. No results for input(s): AMMONIA in the last 168 hours.  ABG    Component Value Date/Time   HCO3 31.9 (H) 11/22/2022 1221   O2SAT 97.2 11/22/2022 1221     Coagulation Profile: Recent Labs  Lab 11/18/24 0405  INR 1.5*    Cardiac Enzymes: No results for input(s): CKTOTAL, CKMB, CKMBINDEX, TROPONINI in the last 168 hours.  HbA1C: Hemoglobin A1C  Date/Time Value Ref Range Status  01/05/2012 02:49 PM 6.7 (H) 4.2 - 6.3 % Final    Comment:    The American Diabetes Association recommends that a primary goal of therapy should be <7% and that physicians should reevaluate the treatment regimen in patients with HbA1c values consistently >8%.     CBG: No results for input(s): GLUCAP in the last 168 hours.  Review of Systems:   Positives in BOLD: Gen: Denies fever, chills, weight change, fatigue, night sweats HEENT: Denies blurred vision, double vision, hearing loss, tinnitus, sinus congestion,  rhinorrhea, sore throat, neck stiffness, dysphagia PULM: Denies shortness of breath, cough, sputum production, hemoptysis, wheezing CV: Denies chest pain, edema, orthopnea, paroxysmal nocturnal dyspnea, palpitations GI: Denies abdominal pain, nausea, vomiting, diarrhea, hematochezia, melena, constipation, change in bowel habits GU: Denies dysuria, hematuria, polyuria, oliguria, urethral discharge Endocrine: Denies hot or cold intolerance, polyuria, polyphagia or appetite change Derm: Denies rash, dry skin, scaling or peeling skin change Heme: Denies easy bruising, bleeding, bleeding gums Neuro: Denies headache, numbness, weakness, slurred speech, loss of memory or consciousness   Past Medical History:  He,  has a past medical history of A-fib (HCC), Acute kidney injury superimposed on chronic kidney disease (11/19/2023), Acute on chronic heart failure (HCC) (11/19/2023), Acute on chronic  heart failure with preserved ejection fraction (HCC) (11/19/2023), Anemia, Arthritis, Cancer (HCC), Cancer of transverse colon (HCC) (10/26/2019), Cardiomyopathy (HCC), Chemotherapy-induced peripheral neuropathy, CHF (congestive heart failure) (HCC), Depression, Dysrhythmia, Elevated troponin (11/19/2023), History of CVA (cerebrovascular accident), HLD (hyperlipidemia), Hypercholesteremia, Hypertension, Moderate tricuspid insufficiency, Multiple myeloma (HCC), Pneumonia, Priapism (07/25/2012), Severe mitral regurgitation (09/13/2024), and Sleep apnea.   Surgical History:   Past Surgical History:  Procedure Laterality Date   ATRIAL ABLATION SURGERY     CARDIAC SURGERY     A-Fib Ablations   COLONOSCOPY N/A 03/17/2024   Procedure: COLONOSCOPY;  Surgeon: Jinny Carmine, MD;  Location: [facility] ENDOSCOPY;  Service: Endoscopy;  Laterality: N/A;   COLONOSCOPY WITH PROPOFOL  N/A 10/26/2019   Procedure: COLONOSCOPY WITH BIOPSY;  Surgeon: Jinny Carmine, MD;  Location: [facility];  Service: Endoscopy;  Laterality: N/A;    COLONOSCOPY WITH PROPOFOL  N/A 09/03/2022   Procedure: COLONOSCOPY WITH PROPOFOL ;  Surgeon: Jinny Carmine, MD;  Location: [facility] SURGERY CNTR;  Service: Endoscopy;  Laterality: N/A;   ESOPHAGOGASTRODUODENOSCOPY N/A 03/17/2024   Procedure: GI Bleeding;  Surgeon: Jinny Carmine, MD;  Location: [facility] ENDOSCOPY;  Service: Endoscopy;  Laterality: N/A;  GI Bleeding, EGD   ESOPHAGOGASTRODUODENOSCOPY (EGD) WITH PROPOFOL  N/A 10/26/2019   Procedure: ESOPHAGOGASTRODUODENOSCOPY (EGD) WITH BIOPSY;  Surgeon: Jinny Carmine, MD;  Location: [facility] SURGERY CNTR;  Service: Endoscopy;  Laterality: N/A;  sleep apnea   LAPAROSCOPIC PARTIAL COLECTOMY Right 11/17/2019   Procedure: LAPAROSCOPIC PARTIAL COLECTOMY RIGHT EXTENDED;  Surgeon: Desiderio Schanz, MD;  Location: [facility] ORS;  Service: General;  Laterality: Right;   PORTACATH PLACEMENT N/A 12/28/2019   Procedure: INSERTION PORT-A-CATH Left subclavian;  Surgeon: Desiderio Schanz, MD;  Location: [facility] ORS;  Service: General;  Laterality: N/A;     Social History:   reports that he has never smoked. He has never been exposed to tobacco smoke. He has never used smokeless tobacco. He reports that he does not currently use alcohol. He reports that he does not use drugs.   Family History:  His family history includes Colon cancer in his brother and father; Healthy in his mother; Parkinson's disease in his father.   Allergies Allergies[1]   Home Medications  Prior to Admission medications  Medication Sig Start Date End Date Taking? Authorizing Provider  allopurinol  (ZYLOPRIM ) 100 MG tablet Take 50 mg by mouth daily. 11/12/24  Yes [provider]  apixaban  (ELIQUIS ) 5 MG TABS tablet Take 5 mg by mouth 2 (two) times daily.   Yes [provider]  atorvastatin  (LIPITOR) 80 MG tablet Take 80 mg by mouth daily.   Yes [provider]  bumetanide  (BUMEX ) 2 MG tablet Take 1.5 mg by mouth daily. Taking one in the morning. 12/17/23 12/11/24 Yes [provider]   calcitRIOL  (ROCALTROL ) 0.25 MCG capsule Take 0.25 mcg by mouth daily. 06/22/24 06/22/25 Yes [provider]  colchicine  0.6 MG tablet Take 1 tablet (0.6 mg total) by mouth daily as needed. Until flare resolves. 03/17/24  Yes Jens Durand, MD  cyclobenzaprine  (FLEXERIL ) 5 MG tablet take 1 tablet by mouth three times daily as needed for muscle spasm for up to 10 days 09/29/24  Yes [provider]  diphenoxylate -atropine  (LOMOTIL ) 2.5-0.025 MG tablet Take 1 tablet by mouth 4 (four) times daily as needed for diarrhea or loose stools. 08/22/24  Yes Borders, Fonda SAUNDERS, NP  gabapentin  (NEURONTIN ) 100 MG capsule Take 2 capsules (200 mg total) by mouth 2 (two) times daily. 08/02/24  Yes Wouk, Devaughn Sayres, MD  hydrocortisone  (ANUSOL -HC) 2.5 % rectal  cream Place 1 Application rectally 2 (two) times daily. 05/21/24  Yes Brimage, Vondra, DO  magnesium  chloride (SLOW-MAG) 64 MG TBEC SR tablet Take 1 tablet (64 mg total) by mouth daily. 11/08/23  Yes Borders, Fonda SAUNDERS, NP  midodrine  (PROAMATINE ) 10 MG tablet Take 10 mg by mouth. 10/26/24  Yes [provider]  Multiple Vitamin (MULTIVITAMIN ADULT PO) Take 1 tablet by mouth daily.   Yes [provider]  oxyCODONE -acetaminophen  (PERCOCET/ROXICET) 5-325 MG tablet Take 1-2 tablets by mouth every 6 (six) hours as needed for severe pain (pain score 7-10). 04/29/24  Yes [name], MD  potassium chloride  SA (KLOR-CON  M) 20 MEQ tablet Take 20 mEq by mouth daily. 11/03/24  Yes [provider]  PSYLLIUM PO Take 1 packet by mouth.   Yes [provider]  sodium bicarbonate  650 MG tablet Take 1 tablet (650 mg total) by mouth 2 (two) times daily. 08/02/24 08/02/25 Yes Wouk, Devaughn Sayres, MD  tamsulosin  (FLOMAX ) 0.4 MG CAPS capsule Take 1 capsule (0.4 mg total) by mouth 2 (two) times daily. 10/06/24  Yes Francisca Redell BROCKS, MD  lidocaine -prilocaine  (EMLA ) cream Apply 1 Application topically daily. Patient not taking: Reported on  11/17/2024    [provider]  ondansetron  (ZOFRAN ) 8 MG tablet Take 1 tablet (8 mg total) by mouth every 8 (eight) hours as needed for nausea or vomiting. Patient not taking: Reported on 11/17/2024 05/05/24   [name], MD  prochlorperazine  (COMPAZINE ) 10 MG tablet Take 1 tablet (10 mg total) by mouth every 6 (six) hours as needed for nausea or vomiting. Patient not taking: Reported on 11/17/2024 05/05/24   Finnegan, Timothy J, MD  [Paused] spironolactone (ALDACTONE) 25 MG tablet Take 1 tablet by mouth every morning. Wait to take this until your doctor or other care provider tells you to start again. 12/17/23 12/11/24  [provider]  valACYclovir  (VALTREX ) 1000 MG tablet Take 1 tablet (1,000 mg total) by mouth daily. Patient not taking: Reported on 11/17/2024 08/21/24   Borders, Fonda SAUNDERS, NP     Critical care time: 55 minutes     Inge Lecher, AGACNP-BC [facility] Prefer epic messenger for cross cover needs If after hours, please call E-link       [1] No Known Allergies  "

## 2024-11-18 NOTE — OR Nursing (Signed)
 Pt noted to have wet breath sounds upon auscultation and only maintaining O2 sats in low 90s. Dr. Fleeta Blush called. MD at bedside to evaluate pt. Order for Lasix  and nebulizer ordered. Medications given.

## 2024-11-18 NOTE — OR Nursing (Signed)
 Pt's BP noted to be high 60s-70s/50s. Dr. Dario called and made aware. Phenylephrine  drip ordered to maintain pts BP.

## 2024-11-18 NOTE — TOC CM/SW Note (Signed)
 Transition of Care [facility]) - Inpatient Brief Assessment   Patient Details  Name: VERLON PISCHKE MRN: [id number] Date of Birth: 06-15-1956  Transition of Care [facility]) CM/SW Contact:    Daved JONETTA Hamilton, RN Phone Number: 11/18/2024, 12:53 PM   Clinical Narrative:   Transition of Care (TOC) Screening Note   Patient Details  Name: ZYMIERE TROSTLE Date of Birth: 1955-12-16   Transition of Care [facility]) CM/SW Contact:    Daved JONETTA Hamilton, RN Phone Number: 11/18/2024, 12:53 PM    Transition of Care Department [facility]) has reviewed patient and no TOC needs have been identified at this time. If new patient transition needs arise, please place a TOC consult.    Transition of Care Asessment: Insurance and Status: Insurance coverage has been reviewed Patient has primary care physician: Yes   Prior level of function:: independent Prior/Current Home Services: No current home services Social Drivers of Health Review: SDOH reviewed no interventions necessary Readmission risk has been reviewed: Yes (Readmit screen complete) Transition of care needs: no transition of care needs at this time

## 2024-11-18 NOTE — Progress Notes (Addendum)
" °  PROGRESS NOTE    Chris George  FMW:[id number] DOB: [birth year] DOA: 11/17/2024 PCP: Jeffie Cheryl FORBES, MD  [id number]/[id number]  LOS: 1 day   Brief hospital course:   Assessment & Plan: Chris George is a 69 y.o. male with medical history significant of ASD status post closure, chronic right-sided CHF, PAF on Eliquis , chronic hypotension on midodrine , mitral regurgitation status post repair in December 2025, colon cancer status post partial colectomy, on chemotherapy, prostate cancer status post radiation therapy, smoldering multiple myeloma, chronic anemia, CKD stage IV, GERD presented with 7 days of black tarry stool and fatigue generalized weakness.    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 --pending capsule study  Acute on chronic normocytic anemia  Likely 2/2 upper GI bleed --Hgb 5.0 on presentation.  Received 2u pRBC. --3rd unit of pRBC today for Hgb 6.3   Hypotension History of baseline hypotension --post EGD, BP dropped.  started on phenylephrine  gtt by anesthesia --cont midodrine    Chronic right-sided CHF - Likely secondary related to chronic ASD which was repaired. --CXR neg for fluid overload --hold Bumex    Question of cirrhosis -This was first shown on a recent CT abd in Sep. Patient not aware. -Outpatient hepatology F/U   PAF - In rate controlled A-fib with bradycardia --hold eliquis    Pancytopenia - Probably related to chemo - No indication for platelet transfusion at this point.   AKI on CKD stage IV Worsening of baseline uremia - AKI likely prerenal secondary to volume depletion from GI bleed, workup and transfusion plan as above. - DDAVP  for uremic bleeding.   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  After EGD, pt was found to be hypotensive.  Pt was started on phenylephrine  gtt by anesthesia and transferred to ICU.   Objective: Vitals:   11/18/24 2015 11/18/24 2030 11/18/24 2045 11/18/24 2100  BP: (!) 103/56 (!) 98/53 102/64 (!) 101/50  Pulse: (!) 56 (!) 54 (!) 57 (!) 57  Resp: 15 19 14 15   Temp:      TempSrc:      SpO2: 99% 99% 98% 98%  Weight:      Height:        Intake/Output Summary (Last 24 hours) at 11/18/2024 2107 Last data filed at 11/18/2024 2000 Gross per 24 hour  Intake 1067 ml  Output 0 ml  Net 1067 ml   Filed Weights   11/17/24 1107  Weight: 86.6 kg    Examination:   Constitutional: NAD, AAOx3 HEENT: conjunctivae and lids normal, EOMI CV: No cyanosis.   RESP: normal respiratory effort, 100% on 2L Extremities: Trace pitting edema in BLE SKIN: warm, dry Neuro: II - XII grossly intact.     Data Reviewed: I have personally reviewed labs and imaging studies  Time spent: 50 minutes, critical care time   Ellouise Haber, MD [facility] If 7PM-7AM, please contact night-coverage 11/18/2024, 9:07 PM   "

## 2024-11-19 ENCOUNTER — Encounter: Payer: Self-pay | Admitting: Gastroenterology

## 2024-11-19 ENCOUNTER — Inpatient Hospital Stay: Admit: 2024-11-19 | Discharge: 2024-11-19 | Disposition: A | Attending: Pulmonary Disease

## 2024-11-19 ENCOUNTER — Inpatient Hospital Stay: Admit: 2024-11-19

## 2024-11-19 DIAGNOSIS — I5031 Acute diastolic (congestive) heart failure: Secondary | ICD-10-CM

## 2024-11-19 DIAGNOSIS — Z515 Encounter for palliative care: Secondary | ICD-10-CM

## 2024-11-19 DIAGNOSIS — K922 Gastrointestinal hemorrhage, unspecified: Secondary | ICD-10-CM | POA: Diagnosis not present

## 2024-11-19 LAB — ECHOCARDIOGRAM COMPLETE
AR max vel: 2.85 cm2
AV Area VTI: 3 cm2
AV Area mean vel: 2.74 cm2
AV Mean grad: 3 mmHg
AV Peak grad: 5.2 mmHg
Ao pk vel: 1.14 m/s
Area-P 1/2: 2.62 cm2
Height: 69 in
S' Lateral: 3.7 cm
Weight: 3056 [oz_av]

## 2024-11-19 LAB — RENAL FUNCTION PANEL
Albumin: 3.1 g/dL — ABNORMAL LOW (ref 3.5–5.0)
Anion gap: 13 (ref 5–15)
BUN: 89 mg/dL — ABNORMAL HIGH (ref 8–23)
CO2: 23 mmol/L (ref 22–32)
Calcium: 8.5 mg/dL — ABNORMAL LOW (ref 8.9–10.3)
Chloride: 97 mmol/L — ABNORMAL LOW (ref 98–111)
Creatinine, Ser: 3.35 mg/dL — ABNORMAL HIGH (ref 0.61–1.24)
GFR, Estimated: 19 mL/min — ABNORMAL LOW
Glucose, Bld: 167 mg/dL — ABNORMAL HIGH (ref 70–99)
Phosphorus: 5.6 mg/dL — ABNORMAL HIGH (ref 2.5–4.6)
Potassium: 4.1 mmol/L (ref 3.5–5.1)
Sodium: 133 mmol/L — ABNORMAL LOW (ref 135–145)

## 2024-11-19 LAB — CBC
HCT: 24.4 % — ABNORMAL LOW (ref 39.0–52.0)
Hemoglobin: 7.9 g/dL — ABNORMAL LOW (ref 13.0–17.0)
MCH: 29.6 pg (ref 26.0–34.0)
MCHC: 32.4 g/dL (ref 30.0–36.0)
MCV: 91.4 fL (ref 80.0–100.0)
Platelets: 115 K/uL — ABNORMAL LOW (ref 150–400)
RBC: 2.67 MIL/uL — ABNORMAL LOW (ref 4.22–5.81)
RDW: 17.6 % — ABNORMAL HIGH (ref 11.5–15.5)
WBC: 3.8 K/uL — ABNORMAL LOW (ref 4.0–10.5)
nRBC: 0 % (ref 0.0–0.2)

## 2024-11-19 LAB — PREPARE RBC (CROSSMATCH)

## 2024-11-19 MED ORDER — SODIUM CHLORIDE 0.9% IV SOLUTION: Freq: Once | INTRAVENOUS | Status: AC

## 2024-11-19 NOTE — Progress Notes (Signed)
*  PRELIMINARY RESULTS* Echocardiogram 2D Echocardiogram has been performed.  Floydene Harder 11/19/2024, 9:15 AM

## 2024-11-19 NOTE — Anesthesia Postprocedure Evaluation (Signed)
"   Anesthesia Post Note  Patient: VINAY ERTL  Procedure(s) Performed: EGD (ESOPHAGOGASTRODUODENOSCOPY) COLONOSCOPY, WITH ARGON PLASMA COAGULATION  Patient location during evaluation: PACU Anesthesia Type: General Level of consciousness: awake Pain management: satisfactory to patient Vital Signs Assessment: post-procedure vital signs reviewed and stable Respiratory status: spontaneous breathing Cardiovascular status: stable Anesthetic complications: no   No notable events documented.   Last Vitals:  Vitals:   11/19/24 0530 11/19/24 0543  BP: (!) 93/54 (!) 90/53  Pulse:  (!) 41  Resp: 13 14  Temp:  36.7 C  SpO2:  97%    Last Pain:  Vitals:   11/19/24 0543  TempSrc: Oral  PainSc:                  VAN STAVEREN,Nyree Yonker      "

## 2024-11-19 NOTE — Consult Note (Signed)
 "    Palliative Medicine [facility] [phone] [phone]   Name: Chris George Date: 11/19/2024 MRN: [id number]  DOB: [birth year]  Patient Care Team: Jeffie Cheryl FORBES, MD as PCP - General (Family Medicine) [name], MD as Consulting Physician (Oncology) [name], MD as Referring Physician (Neurology) Marcelino Gales, MD as Consulting Physician (Nephrology) Jama Margery ORN, MD as Referring Physician (Cardiology) Lenn Aran, MD as Consulting Physician (Radiation Oncology)    REASON FOR CONSULTATION: Chris George is a 69 y.o. male with multiple medical problems including ASD status post closure, chronic right-sided CHF, PAF on Eliquis , hypotension on midodrine , mitral regurgitation status post repair in December 2025, CKD stage IV, smoldering multiple myeloma, prostate cancer status post XRT, and colon cancer currently on chemo holiday since October 2025.  Patient was admitted to the hospital with symptomatic anemia and melena.  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  Palliative care consulted to address goals.  SOCIAL HISTORY:     reports that he has never smoked. He has never been exposed to tobacco smoke. He has never used smokeless tobacco. He reports that he does not currently use alcohol. He reports that he does not use drugs.  Patient unmarried and has no children.  Lives at home alone.  Has a brother and mother who live nearby.  ADVANCE DIRECTIVES:  Does not have  CODE STATUS: Full code  PAST MEDICAL HISTORY: Past Medical History:  Diagnosis Date   A-fib (HCC)    Acute kidney injury superimposed on chronic kidney disease 11/19/2023   Acute on chronic heart failure (HCC) 11/19/2023   Acute on chronic heart failure with preserved ejection fraction (HCC) 11/19/2023   Anemia    Arthritis    ankles   Cancer (HCC)    multiple myeloma    Cancer of transverse colon (HCC) 10/26/2019   Cardiomyopathy (HCC)    Chemotherapy-induced peripheral neuropathy    CHF (congestive heart failure) (HCC)    Depression    Dysrhythmia    atrial fib   Elevated troponin 11/19/2023   History of CVA (cerebrovascular accident)    HLD (hyperlipidemia)    Hypercholesteremia    Hypertension    Moderate tricuspid insufficiency    Multiple myeloma (HCC)    not being treated right now per pt 11/11/19   Pneumonia    Priapism 07/25/2012   Severe mitral regurgitation 09/13/2024   Sleep apnea    No CPAP.  OSA resolved with wt loss.    PAST SURGICAL HISTORY:  Past Surgical History:  Procedure Laterality Date   ATRIAL ABLATION SURGERY     CARDIAC SURGERY     A-Fib Ablations   COLONOSCOPY N/A 03/17/2024   Procedure: COLONOSCOPY;  Surgeon: Jinny Carmine, MD;  Location: [facility] ENDOSCOPY;  Service: Endoscopy;  Laterality: N/A;   COLONOSCOPY  11/18/2024   Procedure: COLONOSCOPY, WITH ARGON PLASMA COAGULATION;  Surgeon: Jinny Carmine, MD;  Location: [facility] ENDOSCOPY;  Service: Endoscopy;;   COLONOSCOPY WITH PROPOFOL  N/A 10/26/2019   Procedure: COLONOSCOPY WITH BIOPSY;  Surgeon: Jinny Carmine, MD;  Location: [facility] SURGERY CNTR;  Service: Endoscopy;  Laterality: N/A;   COLONOSCOPY WITH PROPOFOL  N/A 09/03/2022   Procedure: COLONOSCOPY WITH PROPOFOL ;  Surgeon: Jinny Carmine, MD;  Location: [facility] SURGERY CNTR;  Service: Endoscopy;  Laterality: N/A;   ESOPHAGOGASTRODUODENOSCOPY N/A 03/17/2024   Procedure: GI Bleeding;  Surgeon: Jinny Carmine, MD;  Location: [facility] ENDOSCOPY;  Service: Endoscopy;  Laterality:  N/A;  GI Bleeding, EGD   ESOPHAGOGASTRODUODENOSCOPY N/A 11/18/2024   Procedure: EGD (ESOPHAGOGASTRODUODENOSCOPY);  Surgeon: Jinny Carmine, MD;  Location: [facility] ENDOSCOPY;  Service: Endoscopy;  Laterality: N/A;   ESOPHAGOGASTRODUODENOSCOPY (EGD) WITH PROPOFOL  N/A 10/26/2019   Procedure: ESOPHAGOGASTRODUODENOSCOPY (EGD) WITH BIOPSY;  Surgeon: Jinny Carmine, MD;  Location:  [facility] SURGERY CNTR;  Service: Endoscopy;  Laterality: N/A;  sleep apnea   GIVENS CAPSULE STUDY  11/18/2024   Procedure: IMAGING PROCEDURE, GI TRACT, INTRALUMINAL, VIA CAPSULE;  Surgeon: Jinny Carmine, MD;  Location: [facility] ENDOSCOPY;  Service: Endoscopy;;   LAPAROSCOPIC PARTIAL COLECTOMY Right 11/17/2019   Procedure: LAPAROSCOPIC PARTIAL COLECTOMY RIGHT EXTENDED;  Surgeon: Desiderio Schanz, MD;  Location: [facility] ORS;  Service: General;  Laterality: Right;   PORTACATH PLACEMENT N/A 12/28/2019   Procedure: INSERTION PORT-A-CATH Left subclavian;  Surgeon: Desiderio Schanz, MD;  Location: [facility] ORS;  Service: General;  Laterality: N/A;    HEMATOLOGY/ONCOLOGY HISTORY:  Oncology History  Cancer of transverse colon (HCC)  10/27/2019 Initial Diagnosis   Cancer of transverse colon (HCC)   12/01/2019 Cancer Staging   Staging form: Colon and Rectum, AJCC 8th Edition - Clinical stage from 12/01/2019: Stage IIIC (cT3, cN2b, cM0) - Signed by [name], MD on 12/01/2019   12/30/2019 - 06/03/2020 Chemotherapy   Patient is on Treatment Plan : COLORECTAL FOLFOX q14d x 6 months     05/15/2023 - 04/30/2024 Chemotherapy   Patient is on Treatment Plan : COLORECTAL FOLFIRI q14d     05/12/2024 -  Chemotherapy   Patient is on Treatment Plan : COLORECTAL FOLFIRI q14d       ALLERGIES:  has no known allergies.  MEDICATIONS:  Current Facility-Administered Medications  Medication Dose Route Frequency Provider Last Rate Last Admin   acetaminophen  (TYLENOL ) tablet 650 mg  650 mg Oral Q6H PRN Jinny Carmine, MD       Or   acetaminophen  (TYLENOL ) suppository 650 mg  650 mg Rectal Q6H PRN Jinny Carmine, MD       albuterol  (PROVENTIL ) (2.5 MG/3ML) 0.083% nebulizer solution 3 mL  3 mL Inhalation Q6H PRN Jinny Carmine, MD   3 mL at 11/18/24 0440   allopurinol  (ZYLOPRIM ) tablet 50 mg  50 mg Oral Daily Jinny Carmine, MD   50 mg at 11/17/24 1849   aspirin  EC tablet 81 mg  81 mg Oral Daily Jinny Carmine, MD   81 mg at 11/19/24 9090    Chlorhexidine  Gluconate Cloth 2 % PADS 6 each  6 each Topical Daily Awanda City, MD   6 each at 11/19/24 0911   cyclobenzaprine  (FLEXERIL ) tablet 5 mg  5 mg Oral TID PRN Jinny Carmine, MD   5 mg at 11/17/24 2209   gabapentin  (NEURONTIN ) capsule 200 mg  200 mg Oral BID Jinny Carmine, MD   200 mg at 11/19/24 9090   guaiFENesin -dextromethorphan  (ROBITUSSIN DM) 100-10 MG/5ML syrup 5 mL  5 mL Oral Q4H PRN Jinny Carmine, MD   5 mL at 11/19/24 9090   methylPREDNISolone  sodium succinate (SOLU-MEDROL ) 40 mg/mL injection 40 mg  40 mg Intravenous Daily Keene, Jeremiah D, NP   40 mg at 11/19/24 9087   midodrine  (PROAMATINE ) tablet 10 mg  10 mg Oral TID Jinny Carmine, MD   10 mg at 11/19/24 9090   ondansetron  (ZOFRAN ) tablet 4 mg  4 mg Oral Q6H PRN Jinny Carmine, MD       Or   ondansetron  (ZOFRAN ) injection 4 mg  4 mg Intravenous Q6H PRN Jinny Carmine, MD  pantoprazole  (PROTONIX ) injection 40 mg  40 mg Intravenous Q12H Jinny Carmine, MD   40 mg at 11/19/24 0912   phenylephrine  (NEO-SYNEPHRINE) 20mg /NS 250mL premix infusion  0-400 mcg/min Intravenous Titrated Bousman, Karlie, PA-C 7.5 mL/hr at 11/19/24 1406 10 mcg/min at 11/19/24 1406   sodium bicarbonate  tablet 650 mg  650 mg Oral BID Jinny Carmine, MD   650 mg at 11/19/24 9089   tamsulosin  (FLOMAX ) capsule 0.4 mg  0.4 mg Oral BID Jinny Carmine, MD   0.4 mg at 11/19/24 9090   traZODone  (DESYREL ) tablet 25 mg  25 mg Oral QHS PRN Jinny Carmine, MD       Facility-Administered Medications Ordered in Other Encounters  Medication Dose Route Frequency Provider Last Rate Last Admin   heparin  lock flush 100 unit/mL  500 Units Intravenous Once Finnegan, Timothy J, MD        VITAL SIGNS: BP (!) 80/56   Pulse (!) 48   Temp 98.7 F (37.1 C) (Axillary)   Resp 14   Ht 5' 9 (1.753 m)   Wt 191 lb (86.6 kg)   SpO2 99%   BMI 28.21 kg/m  Filed Weights   11/17/24 1107  Weight: 191 lb (86.6 kg)    Estimated body mass index is 28.21 kg/m as calculated from the  following:   Height as of this encounter: 5' 9 (1.753 m).   Weight as of this encounter: 191 lb (86.6 kg).  LABS: CBC:    Component Value Date/Time   WBC 3.8 (L) 11/19/2024 0727   HGB 7.9 (L) 11/19/2024 0727   HGB 5.0 (LL) 11/17/2024 1002   HGB 14.3 05/13/2013 1116   HCT 24.4 (L) 11/19/2024 0727   HCT 42.9 05/13/2013 1116   PLT 115 (L) 11/19/2024 0727   PLT 94 (L) 11/17/2024 1002   PLT 169 05/13/2013 1116   MCV 91.4 11/19/2024 0727   MCV 84 05/13/2013 1116   NEUTROABS 1.3 (L) 11/17/2024 1002   NEUTROABS 2.3 05/13/2013 1116   LYMPHSABS 0.3 (L) 11/17/2024 1002   LYMPHSABS 0.9 (L) 05/13/2013 1116   MONOABS 0.5 11/17/2024 1002   MONOABS 0.4 05/13/2013 1116   EOSABS 0.1 11/17/2024 1002   EOSABS 0.1 05/13/2013 1116   BASOSABS 0.0 11/17/2024 1002   BASOSABS 0.0 05/13/2013 1116   Comprehensive Metabolic Panel:    Component Value Date/Time   NA 133 (L) 11/19/2024 0727   NA 143 05/13/2013 1116   K 4.1 11/19/2024 0727   K 3.5 05/13/2013 1116   CL 97 (L) 11/19/2024 0727   CL 102 05/13/2013 1116   CO2 23 11/19/2024 0727   CO2 32 05/13/2013 1116   BUN 89 (H) 11/19/2024 0727   BUN 14 05/13/2013 1116   CREATININE 3.35 (H) 11/19/2024 0727   CREATININE 3.51 (H) 11/17/2024 1002   CREATININE 1.53 (H) 05/13/2013 1116   GLUCOSE 167 (H) 11/19/2024 0727   GLUCOSE 122 (H) 05/13/2013 1116   CALCIUM  8.5 (L) 11/19/2024 0727   CALCIUM  8.6 05/13/2013 1116   AST 27 11/18/2024 0405   AST 32 11/17/2024 1002   ALT 15 11/18/2024 0405   ALT 17 11/17/2024 1002   ALT 37 07/24/2012 1915   ALKPHOS 260 (H) 11/18/2024 0405   ALKPHOS 129 07/24/2012 1915   BILITOT 1.5 (H) 11/18/2024 0405   BILITOT 0.4 11/17/2024 1002   PROT 6.0 (L) 11/18/2024 0405   PROT 8.4 (H) 07/24/2012 1915   ALBUMIN 3.1 (L) 11/19/2024 0727   ALBUMIN 4.0 07/24/2012 1915    RADIOGRAPHIC  STUDIES: ECHOCARDIOGRAM COMPLETE Result Date: 11/19/2024    ECHOCARDIOGRAM REPORT   Patient Name:   Chris George Date of Exam: 11/19/2024  Medical Rec #:  [id number]      Height:       69.0 in Accession #:    [id number]     Weight:       191.0 lb Date of Birth:  04-13-1956       BSA:          2.026 m Patient Age:    68 years       BP:           104/59 mmHg Patient Gender: M              HR:           41 bpm. Exam Location:  [facility] Procedure: 2D Echo, Cardiac Doppler and Color Doppler (Both Spectral and Color            Flow Doppler were utilized during procedure). Indications:     CHF-acute diastolic I50.31  History:         Patient has no prior history of Echocardiogram examinations.  Sonographer:     Christopher Furnace Referring Phys:  [id number] INGE JONETTA LECHER Diagnosing Phys: Evalene Lunger MD IMPRESSIONS  1. Left ventricular ejection fraction, by estimation, is 40 to 45%. The left ventricle has mildly decreased function. The left ventricle demonstrates global hypokinesis. There is moderate left ventricular hypertrophy. Left ventricular diastolic parameters are indeterminate.  2. Right ventricular systolic function is moderately reduced. The right ventricular size is moderately enlarged. There is moderately elevated pulmonary artery systolic pressure.  3. Right atrial size was severely dilated.  4. The mitral valve is normal in structure. No evidence of mitral valve regurgitation. No evidence of mitral stenosis. Severe mitral annular calcification.  5. Tricuspid valve regurgitation is mild to moderate.  6. The aortic valve is tricuspid. Aortic valve regurgitation is not visualized. No aortic stenosis is present.  7. Mild pulmonic stenosis.  8. The inferior vena cava is normal in size with greater than 50% respiratory variability, suggesting right atrial pressure of 3 mmHg. FINDINGS  Left Ventricle: Left ventricular ejection fraction, by estimation, is 40 to 45%. The left ventricle has mildly decreased function. The left ventricle demonstrates global hypokinesis. Strain was performed and the global longitudinal strain is indeterminate. The left ventricular  internal cavity size was normal in size. There is moderate left ventricular hypertrophy. Left ventricular diastolic parameters are indeterminate. Right Ventricle: The right ventricular size is moderately enlarged. No increase in right ventricular wall thickness. Right ventricular systolic function is moderately reduced. There is moderately elevated pulmonary artery systolic pressure. The tricuspid  regurgitant velocity is 3.21 m/s, and with an assumed right atrial pressure of 5 mmHg, the estimated right ventricular systolic pressure is 46.2 mmHg. Left Atrium: Left atrial size was normal in size. Right Atrium: Right atrial size was severely dilated. Pericardium: There is no evidence of pericardial effusion. Mitral Valve: The mitral valve is normal in structure. Severe mitral annular calcification. No evidence of mitral valve regurgitation. No evidence of mitral valve stenosis. Tricuspid Valve: The tricuspid valve is normal in structure. Tricuspid valve regurgitation is mild to moderate. No evidence of tricuspid stenosis. Aortic Valve: The aortic valve is tricuspid. Aortic valve regurgitation is not visualized. No aortic stenosis is present. Aortic valve mean gradient measures 3.0 mmHg. Aortic valve peak gradient measures 5.2 mmHg. Aortic valve area, by VTI measures 3.00 cm. Pulmonic Valve:  The pulmonic valve was normal in structure. Pulmonic valve regurgitation is mild. Mild pulmonic stenosis. Aorta: The aortic root is normal in size and structure. Venous: The inferior vena cava is normal in size with greater than 50% respiratory variability, suggesting right atrial pressure of 3 mmHg. IAS/Shunts: No atrial level shunt detected by color flow Doppler. Additional Comments: 3D was performed not requiring image post processing on an independent workstation and was indeterminate.  LEFT VENTRICLE PLAX 2D LVIDd:         5.30 cm   Diastology LVIDs:         3.70 cm   LV e' medial:    4.35 cm/s LV PW:         1.70 cm   LV  E/e' medial:  30.8 LV IVS:        1.70 cm   LV e' lateral:   8.05 cm/s LVOT diam:     2.30 cm   LV E/e' lateral: 16.6 LV SV:         59 LV SV Index:   29 LVOT Area:     4.15 cm  LEFT ATRIUM           Index        RIGHT ATRIUM           Index LA diam:      5.10 cm 2.52 cm/m   RA Area:     78.40 cm LA Vol (A4C): 81.6 ml 40.28 ml/m  RA Volume:   489.00 ml 241.37 ml/m  AORTIC VALVE                    PULMONIC VALVE AV Area (Vmax):    2.85 cm     PR End Diast Vel: 9.49 msec AV Area (Vmean):   2.74 cm AV Area (VTI):     3.00 cm AV Vmax:           114.00 cm/s AV Vmean:          75.700 cm/s AV VTI:            0.195 m AV Peak Grad:      5.2 mmHg AV Mean Grad:      3.0 mmHg LVOT Vmax:         78.20 cm/s LVOT Vmean:        50.000 cm/s LVOT VTI:          0.141 m LVOT/AV VTI ratio: 0.72 MITRAL VALVE                TRICUSPID VALVE MV Area (PHT): 2.62 cm     TR Peak grad:   41.2 mmHg MV Decel Time: 290 msec     TR Vmax:        321.00 cm/s MV E velocity: 134.00 cm/s                             SHUNTS                             Systemic VTI:  0.14 m                             Systemic Diam: 2.30 cm Evalene Lunger MD Electronically signed by Evalene Lunger MD Signature Date/Time: 11/19/2024/1:17:58 PM    Final    DG Chest  Port 1 View Result Date: 11/18/2024 CLINICAL DATA:  Acute respiratory failure, hypoxia EXAM: PORTABLE CHEST 1 VIEW COMPARISON:  11/18/2024 FINDINGS: Single frontal view of the chest demonstrates a stable enlarged cardiac silhouette. Central pulmonary vascular congestion with patchy perihilar ground-glass airspace disease consistent with edema. No effusion or pneumothorax. Left chest wall port unchanged. No acute bony abnormalities. IMPRESSION: 1. Mild congestive heart failure. Electronically Signed   By: Ozell Daring M.D.   On: 11/18/2024 18:50   DG Chest 1 View Result Date: 11/18/2024 EXAM: 1 VIEW(S) XRAY OF THE CHEST 11/18/2024 06:22:00 AM COMPARISON: 11/17/2024 CLINICAL HISTORY: The patient has  congestive heart failure (CHF). ICD10: 02706 - Congestive heart failure. FINDINGS: LINES, TUBES AND DEVICES: Stable left chest port. Cardiac monitoring devices noted. LUNGS AND PLEURA: No focal pulmonary opacity. No pleural effusion. No pneumothorax. HEART AND MEDIASTINUM: Stable cardiomegaly. BONES AND SOFT TISSUES: No acute osseous abnormality. IMPRESSION: 1. Stable cardiomegaly. 2. No acute findings. Electronically signed by: Waddell Calk MD 11/18/2024 06:35 AM EST RP Workstation: [id number]   DG Chest Portable 1 View Result Date: 11/17/2024 CLINICAL DATA:  Dyspnea EXAM: PORTABLE CHEST 1 VIEW COMPARISON:  Mar 13, 2024 FINDINGS: Stable cardiomegaly. Left subclavian Port-A-Cath is unchanged. Both lungs are clear. The visualized skeletal structures are unremarkable. IMPRESSION: No active disease. Electronically Signed   By: Lynwood Landy Raddle M.D.   On: 11/17/2024 12:12    PERFORMANCE STATUS (ECOG) : 2 - Symptomatic, <50% confined to bed  Review of Systems Unless otherwise noted, a complete review of systems is negative.  Physical Exam General: NAD Cardiovascular: regular rate and rhythm Pulmonary: clear ant fields Abdomen: soft, nontender, + bowel sounds GU: no suprapubic tenderness Extremities: no edema, no joint deformities Skin: no rashes Neurological: Weakness but otherwise nonfocal  IMPRESSION: Patient with multiple medical problems including history of CVA, CHF, recent heart valve repair, CKD stage IV, history of prostate cancer, multiple myeloma, and colorectal cancer, who was admitted the hospital with melena.  EGD showed nonbleeding angiodysplastic lesions treated with APC.  Patient and ICU on phenylephrine .  States that he overall feels much improved today.  Patient saw medical oncology earlier today.  He is aware that CEA is rising and concerning for progression.  He was pending outpatient PET scan.  Patient says that he is in agreement with current scope of treatment. Patient has  been treated for many years for cancer and recognizes that he is overall doing well.  He has voiced in the past feeling overwhelmed with frequent medical visits and oncology care.  Patient is aware that he has the option of foregoing treatment and focusing on quality of life.  However, he would like to obtain imaging and then discuss possible treatment options with Dr. Jacobo.   Patient lives at home alone but has support from his brother and mother.  Patient is interested in establishing ACP documents and I will ask the chaplain to assist with this.  Read long conversation regarding CODE STATUS.  Patient says he would like to speak with his pastor and has spiritual questions regarding resuscitation.  Of note, patient says that they had to withdraw care for his sister.  PLAN: - Continue current scope of treatment - Chaplain to complete ACP documents - Patient considering decision making - Outpatient follow-up  Case and plan discussed with Dr. Aleskerov and Dr. Jacobo    Time Total: 50 minutes  Visit consisted of counseling and education dealing with the complex and emotionally intense issues of symptom management and  palliative care in the setting of serious and potentially life-threatening illness.Greater than 50%  of this time was spent counseling and coordinating care related to the above assessment and plan.  Signed by: Fonda Mower, PhD, NP-C   "

## 2024-11-19 NOTE — Consult Note (Signed)
 " [facility]  Telephone:(336) [phone] Fax:(336) [phone]  ID: Chris George OB: 1956/06/04  MR#: [id number]  RDW#:[id number]  Patient Care Team: Jeffie Cheryl FORBES, MD as PCP - General (Family Medicine) [name], MD as Consulting Physician (Oncology) [name], MD as Referring Physician (Neurology) Marcelino Gales, MD as Consulting Physician (Nephrology) Jama Margery ORN, MD as Referring Physician (Cardiology) Lenn Aran, MD as Consulting Physician (Radiation Oncology)  CHIEF COMPLAINT: History of recurrent metastatic colon cancer, GI bleed.  INTERVAL HISTORY: Patient is a 69 year old male with multiple medical problems including recurrent colon cancer and significant cardiac disease who recently presented with increasing fatigue and melena.  Globin was noted to be 5.3.  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  His hemoglobin has improved to 7.9 with transfusion and he feels significantly improved.  He has no neurologic complaints.  He denies any recent fevers.  He has a fair appetite, but denies weight loss.  He has no chest pain, shortness of breath, cough, or hemoptysis.  He denies any nausea, vomiting, constipation, or diarrhea.  He has no urinary complaints.  Patient offers no further specific complaints today.  REVIEW OF SYSTEMS:   Review of Systems  Constitutional:  Positive for malaise/fatigue. Negative for fever and weight loss.  Respiratory: Negative.  Negative for cough, hemoptysis and shortness of breath.   Cardiovascular: Negative.  Negative for chest pain and leg swelling.  Gastrointestinal:  Positive for melena. Negative for abdominal pain and blood in stool.  Genitourinary: Negative.  Negative for dysuria.  Musculoskeletal: Negative.  Negative for myalgias.  Skin: Negative.  Negative for rash.  Neurological:  Positive for weakness. Negative for dizziness, focal weakness and  headaches.  Psychiatric/Behavioral: Negative.  The patient is not nervous/anxious.     As per HPI. Otherwise, a complete review of systems is negative.  PAST MEDICAL HISTORY: Past Medical History:  Diagnosis Date   A-fib (HCC)    Acute kidney injury superimposed on chronic kidney disease 11/19/2023   Acute on chronic heart failure (HCC) 11/19/2023   Acute on chronic heart failure with preserved ejection fraction (HCC) 11/19/2023   Anemia    Arthritis    ankles   Cancer (HCC)    multiple myeloma   Cancer of transverse colon (HCC) 10/26/2019   Cardiomyopathy (HCC)    Chemotherapy-induced peripheral neuropathy    CHF (congestive heart failure) (HCC)    Depression    Dysrhythmia    atrial fib   Elevated troponin 11/19/2023   History of CVA (cerebrovascular accident)    HLD (hyperlipidemia)    Hypercholesteremia    Hypertension    Moderate tricuspid insufficiency    Multiple myeloma (HCC)    not being treated right now per pt 11/11/19   Pneumonia    Priapism 07/25/2012   Severe mitral regurgitation 09/13/2024   Sleep apnea    No CPAP.  OSA resolved with wt loss.    PAST SURGICAL HISTORY: Past Surgical History:  Procedure Laterality Date   ATRIAL ABLATION SURGERY     CARDIAC SURGERY     A-Fib Ablations   COLONOSCOPY N/A 03/17/2024   Procedure: COLONOSCOPY;  Surgeon: Jinny Carmine, MD;  Location: [facility] ENDOSCOPY;  Service: Endoscopy;  Laterality: N/A;   COLONOSCOPY  11/18/2024   Procedure: COLONOSCOPY, WITH ARGON PLASMA COAGULATION;  Surgeon: Jinny Carmine, MD;  Location: [facility] ENDOSCOPY;  Service: Endoscopy;;   COLONOSCOPY WITH PROPOFOL  N/A 10/26/2019   Procedure: COLONOSCOPY  WITH BIOPSY;  Surgeon: Jinny Carmine, MD;  Location: [facility] SURGERY CNTR;  Service: Endoscopy;  Laterality: N/A;   COLONOSCOPY WITH PROPOFOL  N/A 09/03/2022   Procedure: COLONOSCOPY WITH PROPOFOL ;  Surgeon: Jinny Carmine, MD;  Location: [facility] SURGERY CNTR;  Service: Endoscopy;  Laterality: N/A;    ESOPHAGOGASTRODUODENOSCOPY N/A 03/17/2024   Procedure: GI Bleeding;  Surgeon: Jinny Carmine, MD;  Location: [facility] ENDOSCOPY;  Service: Endoscopy;  Laterality: N/A;  GI Bleeding, EGD   ESOPHAGOGASTRODUODENOSCOPY N/A 11/18/2024   Procedure: EGD (ESOPHAGOGASTRODUODENOSCOPY);  Surgeon: Jinny Carmine, MD;  Location: [facility] ENDOSCOPY;  Service: Endoscopy;  Laterality: N/A;   ESOPHAGOGASTRODUODENOSCOPY (EGD) WITH PROPOFOL  N/A 10/26/2019   Procedure: ESOPHAGOGASTRODUODENOSCOPY (EGD) WITH BIOPSY;  Surgeon: Jinny Carmine, MD;  Location: [facility] SURGERY CNTR;  Service: Endoscopy;  Laterality: N/A;  sleep apnea   GIVENS CAPSULE STUDY  11/18/2024   Procedure: IMAGING PROCEDURE, GI TRACT, INTRALUMINAL, VIA CAPSULE;  Surgeon: Jinny Carmine, MD;  Location: [facility] ENDOSCOPY;  Service: Endoscopy;;   LAPAROSCOPIC PARTIAL COLECTOMY Right 11/17/2019   Procedure: LAPAROSCOPIC PARTIAL COLECTOMY RIGHT EXTENDED;  Surgeon: Desiderio Schanz, MD;  Location: [facility] ORS;  Service: General;  Laterality: Right;   PORTACATH PLACEMENT N/A 12/28/2019   Procedure: INSERTION PORT-A-CATH Left subclavian;  Surgeon: Desiderio Schanz, MD;  Location: [facility] ORS;  Service: General;  Laterality: N/A;    FAMILY HISTORY: Family History  Problem Relation Age of Onset   Healthy Mother    Colon cancer Father    Parkinson's disease Father    Colon cancer Brother     ADVANCED DIRECTIVES (Y/N):  @ADVDIR @  HEALTH MAINTENANCE: Social History[1]   Colonoscopy:  PAP:  Bone density:  Lipid panel:  Allergies[2]  Current Facility-Administered Medications  Medication Dose Route Frequency Provider Last Rate Last Admin   acetaminophen  (TYLENOL ) tablet 650 mg  650 mg Oral Q6H PRN Jinny Carmine, MD       Or   acetaminophen  (TYLENOL ) suppository 650 mg  650 mg Rectal Q6H PRN Jinny Carmine, MD       albuterol  (PROVENTIL ) (2.5 MG/3ML) 0.083% nebulizer solution 3 mL  3 mL Inhalation Q6H PRN Jinny Carmine, MD   3 mL at 11/18/24 0440   allopurinol  (ZYLOPRIM ) tablet 50 mg   50 mg Oral Daily Jinny Carmine, MD   50 mg at 11/17/24 1849   aspirin  EC tablet 81 mg  81 mg Oral Daily Jinny Carmine, MD   81 mg at 11/19/24 9090   Chlorhexidine  Gluconate Cloth 2 % PADS 6 each  6 each Topical Daily Awanda City, MD   6 each at 11/19/24 0911   cyclobenzaprine  (FLEXERIL ) tablet 5 mg  5 mg Oral TID PRN Jinny Carmine, MD   5 mg at 11/17/24 2209   gabapentin  (NEURONTIN ) capsule 200 mg  200 mg Oral BID Jinny Carmine, MD   200 mg at 11/19/24 9090   guaiFENesin -dextromethorphan  (ROBITUSSIN DM) 100-10 MG/5ML syrup 5 mL  5 mL Oral Q4H PRN Jinny Carmine, MD   5 mL at 11/19/24 0909   methylPREDNISolone  sodium succinate (SOLU-MEDROL ) 40 mg/mL injection 40 mg  40 mg Intravenous Daily Keene, Jeremiah D, NP   40 mg at 11/19/24 0912   midodrine  (PROAMATINE ) tablet 10 mg  10 mg Oral TID Jinny Carmine, MD   10 mg at 11/19/24 9090   ondansetron  (ZOFRAN ) tablet 4 mg  4 mg Oral Q6H PRN Jinny Carmine, MD       Or   ondansetron  (ZOFRAN ) injection 4 mg  4 mg Intravenous Q6H PRN Jinny Carmine, MD  pantoprazole  (PROTONIX ) injection 40 mg  40 mg Intravenous Q12H Jinny Carmine, MD   40 mg at 11/19/24 0912   phenylephrine  (NEO-SYNEPHRINE) 20mg /NS premix infusion  0-400 mcg/min Intravenous Titrated Bousman, Karlie, PA-C 7.5 mL/hr at 11/19/24 1406 10 mcg/min at 11/19/24 1406   sodium bicarbonate  tablet 650 mg  650 mg Oral BID Jinny Carmine, MD   650 mg at 11/19/24 9089   tamsulosin  (FLOMAX ) capsule 0.4 mg  0.4 mg Oral BID Jinny Carmine, MD   0.4 mg at 11/19/24 9090   traZODone  (DESYREL ) tablet 25 mg  25 mg Oral QHS PRN Jinny Carmine, MD       Facility-Administered Medications Ordered in Other Encounters  Medication Dose Route Frequency Provider Last Rate Last Admin   heparin  lock flush 100 unit/mL  500 Units Intravenous Once [name], MD        OBJECTIVE: Vitals:   11/19/24 1500 11/19/24 1515  BP: (!) 109/53 (!) 98/55  Pulse: (!) 53 (!) 50  Resp: 14 14  Temp:    SpO2: 97% 97%     Body mass  index is 28.21 kg/m.    ECOG FS:2 - Symptomatic, <50% confined to bed  General: Well-developed, well-nourished, no acute distress. Eyes: Pink conjunctiva, anicteric sclera. HEENT: Normocephalic, moist mucous membranes. Lungs: No audible wheezing or coughing. Heart: Regular rate and rhythm. Abdomen: Soft, nontender, no obvious distention. Musculoskeletal: No edema, cyanosis, or clubbing. Neuro: Alert, answering all questions appropriately. Cranial nerves grossly intact. Skin: No rashes or petechiae noted. Psych: Normal affect.  LAB RESULTS:  Lab Results  Component Value Date   NA 133 (L) 11/19/2024   K 4.1 11/19/2024   CL 97 (L) 11/19/2024   CO2 23 11/19/2024   GLUCOSE 167 (H) 11/19/2024   BUN 89 (H) 11/19/2024   CREATININE 3.35 (H) 11/19/2024   CALCIUM  8.5 (L) 11/19/2024   PROT 6.0 (L) 11/18/2024   ALBUMIN 3.1 (L) 11/19/2024   AST 27 11/18/2024   ALT 15 11/18/2024   ALKPHOS 260 (H) 11/18/2024   BILITOT 1.5 (H) 11/18/2024   GFRNONAA 19 (L) 11/19/2024   GFRAA >60 07/29/2020    Lab Results  Component Value Date   WBC 3.8 (L) 11/19/2024   NEUTROABS 1.3 (L) 11/17/2024   HGB 7.9 (L) 11/19/2024   HCT 24.4 (L) 11/19/2024   MCV 91.4 11/19/2024   PLT 115 (L) 11/19/2024     STUDIES: ECHOCARDIOGRAM COMPLETE Result Date: 11/19/2024    ECHOCARDIOGRAM REPORT   Patient Name:   Chris George Date of Exam: 11/19/2024 Medical Rec #:  [id number]      Height:       69.0 in Accession #:    [id number]     Weight:       191.0 lb Date of Birth:  1956-10-28       BSA:          2.026 m Patient Age:    68 years       BP:           104/59 mmHg Patient Gender: M              HR:           41 bpm. Exam Location:  [facility] Procedure: 2D Echo, Cardiac Doppler and Color Doppler (Both Spectral and Color            Flow Doppler were utilized during procedure). Indications:     CHF-acute diastolic I50.31  History:  Patient has no prior history of Echocardiogram examinations.  Sonographer:     Christopher Furnace  Referring Phys:  [id number] INGE JONETTA LECHER Diagnosing Phys: Evalene Lunger MD IMPRESSIONS  1. Left ventricular ejection fraction, by estimation, is 40 to 45%. The left ventricle has mildly decreased function. The left ventricle demonstrates global hypokinesis. There is moderate left ventricular hypertrophy. Left ventricular diastolic parameters are indeterminate.  2. Right ventricular systolic function is moderately reduced. The right ventricular size is moderately enlarged. There is moderately elevated pulmonary artery systolic pressure.  3. Right atrial size was severely dilated.  4. The mitral valve is normal in structure. No evidence of mitral valve regurgitation. No evidence of mitral stenosis. Severe mitral annular calcification.  5. Tricuspid valve regurgitation is mild to moderate.  6. The aortic valve is tricuspid. Aortic valve regurgitation is not visualized. No aortic stenosis is present.  7. Mild pulmonic stenosis.  8. The inferior vena cava is normal in size with greater than 50% respiratory variability, suggesting right atrial pressure of 3 mmHg. FINDINGS  Left Ventricle: Left ventricular ejection fraction, by estimation, is 40 to 45%. The left ventricle has mildly decreased function. The left ventricle demonstrates global hypokinesis. Strain was performed and the global longitudinal strain is indeterminate. The left ventricular internal cavity size was normal in size. There is moderate left ventricular hypertrophy. Left ventricular diastolic parameters are indeterminate. Right Ventricle: The right ventricular size is moderately enlarged. No increase in right ventricular wall thickness. Right ventricular systolic function is moderately reduced. There is moderately elevated pulmonary artery systolic pressure. The tricuspid  regurgitant velocity is 3.21 m/s, and with an assumed right atrial pressure of 5 mmHg, the estimated right ventricular systolic pressure is 46.2 mmHg. Left Atrium: Left atrial size  was normal in size. Right Atrium: Right atrial size was severely dilated. Pericardium: There is no evidence of pericardial effusion. Mitral Valve: The mitral valve is normal in structure. Severe mitral annular calcification. No evidence of mitral valve regurgitation. No evidence of mitral valve stenosis. Tricuspid Valve: The tricuspid valve is normal in structure. Tricuspid valve regurgitation is mild to moderate. No evidence of tricuspid stenosis. Aortic Valve: The aortic valve is tricuspid. Aortic valve regurgitation is not visualized. No aortic stenosis is present. Aortic valve mean gradient measures 3.0 mmHg. Aortic valve peak gradient measures 5.2 mmHg. Aortic valve area, by VTI measures 3.00 cm. Pulmonic Valve: The pulmonic valve was normal in structure. Pulmonic valve regurgitation is mild. Mild pulmonic stenosis. Aorta: The aortic root is normal in size and structure. Venous: The inferior vena cava is normal in size with greater than 50% respiratory variability, suggesting right atrial pressure of 3 mmHg. IAS/Shunts: No atrial level shunt detected by color flow Doppler. Additional Comments: 3D was performed not requiring image post processing on an independent workstation and was indeterminate.  LEFT VENTRICLE PLAX 2D LVIDd:         5.30 cm   Diastology LVIDs:         3.70 cm   LV e' medial:    4.35 cm/s LV PW:         1.70 cm   LV E/e' medial:  30.8 LV IVS:        1.70 cm   LV e' lateral:   8.05 cm/s LVOT diam:     2.30 cm   LV E/e' lateral: 16.6 LV SV:         59 LV SV Index:   29 LVOT Area:     4.15  cm  LEFT ATRIUM           Index        RIGHT ATRIUM           Index LA diam:      5.10 cm 2.52 cm/m   RA Area:     78.40 cm LA Vol (A4C): 81.6 ml 40.28 ml/m  RA Volume:   489.00 ml 241.37 ml/m  AORTIC VALVE                    PULMONIC VALVE AV Area (Vmax):    2.85 cm     PR End Diast Vel: 9.49 msec AV Area (Vmean):   2.74 cm AV Area (VTI):     3.00 cm AV Vmax:           114.00 cm/s AV Vmean:           75.700 cm/s AV VTI:            0.195 m AV Peak Grad:      5.2 mmHg AV Mean Grad:      3.0 mmHg LVOT Vmax:         78.20 cm/s LVOT Vmean:        50.000 cm/s LVOT VTI:          0.141 m LVOT/AV VTI ratio: 0.72 MITRAL VALVE                TRICUSPID VALVE MV Area (PHT): 2.62 cm     TR Peak grad:   41.2 mmHg MV Decel Time: 290 msec     TR Vmax:        321.00 cm/s MV E velocity: 134.00 cm/s                             SHUNTS                             Systemic VTI:  0.14 m                             Systemic Diam: 2.30 cm Evalene Lunger MD Electronically signed by Evalene Lunger MD Signature Date/Time: 11/19/2024/1:17:58 PM    Final    DG Chest Port 1 View Result Date: 11/18/2024 CLINICAL DATA:  Acute respiratory failure, hypoxia EXAM: PORTABLE CHEST 1 VIEW COMPARISON:  11/18/2024 FINDINGS: Single frontal view of the chest demonstrates a stable enlarged cardiac silhouette. Central pulmonary vascular congestion with patchy perihilar ground-glass airspace disease consistent with edema. No effusion or pneumothorax. Left chest wall port unchanged. No acute bony abnormalities. IMPRESSION: 1. Mild congestive heart failure. Electronically Signed   By: Ozell Daring M.D.   On: 11/18/2024 18:50   DG Chest 1 View Result Date: 11/18/2024 EXAM: 1 VIEW(S) XRAY OF THE CHEST 11/18/2024 06:22:00 AM COMPARISON: 11/17/2024 CLINICAL HISTORY: The patient has congestive heart failure (CHF). ICD10: 02706 - Congestive heart failure. FINDINGS: LINES, TUBES AND DEVICES: Stable left chest port. Cardiac monitoring devices noted. LUNGS AND PLEURA: No focal pulmonary opacity. No pleural effusion. No pneumothorax. HEART AND MEDIASTINUM: Stable cardiomegaly. BONES AND SOFT TISSUES: No acute osseous abnormality. IMPRESSION: 1. Stable cardiomegaly. 2. No acute findings. Electronically signed by: Waddell Calk MD 11/18/2024 06:35 AM EST RP Workstation: [id number]   DG Chest Portable 1 View Result Date: 11/17/2024 CLINICAL DATA:  Dyspnea EXAM:  PORTABLE CHEST 1 VIEW COMPARISON:  Mar 13, 2024 FINDINGS: Stable cardiomegaly. Left subclavian Port-A-Cath is unchanged. Both lungs are clear. The visualized skeletal structures are unremarkable. IMPRESSION: No active disease. Electronically Signed   By: Lynwood Landy Raddle M.D.   On: 11/17/2024 12:12    ASSESSMENT: History of recurrent metastatic colon cancer, GI bleed.  PLAN:    Recurrent colon cancer: Patient last received chemotherapy on August 11, 2024 at which point he requested a holiday from his treatments.  Recently, his CEA has significantly increased from 273 to his most recent result of 934.  Suspect progressive disease.  Patient will require imaging either as an inpatient or after discharge for confirmation.  He reports he has additional cardiac procedure on his mitral valve at [facility] in the near future but is willing to reinitiate chemotherapy after that.  Will arrange follow-up in the cancer center. Cardiac disease: Patient recently had mitral valve repair and reports he has a second procedure scheduled at [facility] in approximately 2 weeks.  He has been instructed to keep this procedure as scheduled and will follow-up in the cancer center to discuss reinitiating treatments. Anemia: Secondary to GI bleed from what appears to be angiodysplastic lesions.  EGD has subsequently cauterized these lesions.  Hemoglobin has improved to 7.9 with transfusion.  Continue to monitor closely and transfuse to maintain hemoglobin greater than 7.0. Thrombocytopenia: Chronic and unchanged.  Monitor. Renal insufficiency: Patient's creatinine is slightly worse than baseline.  Continue to monitor.  Appreciate nephrology input.  Appreciate consult, will follow.  Evalene JINNY Reusing, MD   11/19/2024 3:45 PM        [1]  Social History Tobacco Use   Smoking status: Never    Passive exposure: Never   Smokeless tobacco: Never  Vaping Use   Vaping status: Never Used  Substance Use Topics   Alcohol use: Not  Currently   Drug use: Never  [2] No Known Allergies  "

## 2024-11-19 NOTE — Progress Notes (Signed)
 " [facility]  ROUNDING NOTE   Subjective:   Patient well-known to us  as we follow him for chronic kidney disease stage IV as an outpatient.  Came in with 7 days of black tarry stools and fatigue.  Underwent EGD 11/18/2024.  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  A few recently bleeding angiodysplastic lesions of the duodenum were also noted treated with APC.  Patient status post multiple blood transfusions.  Baseline creatinine 2.8 with eGFR 24.  Creatinine currently 3.5 with a eGFR of 18.  Objective:  Vital signs in last 24 hours:  Temp:  [98.1 F (36.7 C)-99.9 F (37.7 C)] 98.1 F (36.7 C) (01/15 0543) Pulse Rate:  [38-107] 43 (01/15 0745) Resp:  [11-24] 15 (01/15 0745) BP: (56-136)/(43-124) 104/55 (01/15 0745) SpO2:  [93 %-100 %] 96 % (01/15 0745)  Weight change:  Filed Weights   11/17/24 1107  Weight: 86.6 kg    Intake/Output: I/O last 3 completed shifts: In: 2000.1 [I.V.:610.1; Blood:1290; IV Piggyback:100] Out: 400 [Urine:400]   Intake/Output this shift:  Total I/O In: 61.6 [I.V.:61.6] Out: -   Physical Exam: General: No acute distress  Head: Normocephalic, atraumatic. Moist oral mucosal membranes  Neck: Supple  Lungs:  Clear to auscultation, normal effort  Heart: S1S2 no rubs  Abdomen:  Soft, nontender, bowel sounds present  Extremities: Trace peripheral edema.  Neurologic: Awake, alert, following commands  Skin: No acute rash  Access: No hemodialysis access    Basic Metabolic Panel: Recent Labs  Lab 11/17/24 1002 11/18/24 0405 11/18/24 2216  NA 135 136 133*  K 3.7 4.2 4.0  CL 98 98 96*  CO2 25 27 23   GLUCOSE 130* 130* 114*  BUN 92* 94* 91*  CREATININE 3.51* 3.70* 3.57*  CALCIUM  8.5* 8.3* 8.2*  MG 2.1  --   --     Liver Function Tests: Recent Labs  Lab 11/17/24 1002 11/18/24 0405  AST 32 27  ALT 17 15  ALKPHOS 286* 260*  BILITOT 0.4 1.5*  PROT 6.2* 6.0*  ALBUMIN 3.2* 3.1*    No results for input(s): LIPASE, AMYLASE in the last 168 hours. No results for input(s): AMMONIA in the last 168 hours.  CBC: Recent Labs  Lab 11/17/24 1002 11/17/24 2049 11/18/24 0405 11/18/24 0735 11/18/24 2216  WBC 2.1*  --  2.5*  --  4.4  NEUTROABS 1.3*  --   --   --   --   HGB 5.0* 5.3* 6.2* 6.3* 6.8*  HCT 16.3* 17.1* 19.6* 20.0* 21.1*  MCV 93.1  --  92.5  --  91.3  PLT 94*  --  105*  --  102*    Cardiac Enzymes: No results for input(s): CKTOTAL, CKMB, CKMBINDEX, TROPONINI in the last 168 hours.  BNP: Invalid input(s): POCBNP  CBG: Recent Labs  Lab 11/18/24 1827  GLUCAP 111*    Microbiology: Results for orders placed or performed during the hospital encounter of 11/17/24  Resp panel by RT-PCR (RSV, Flu A&B, Covid) Anterior Nasal Swab     Status: None   Collection Time: 11/17/24 12:30 PM   Specimen: Anterior Nasal Swab  Result Value Ref Range Status   SARS Coronavirus 2 by RT PCR NEGATIVE NEGATIVE Final    Comment: (NOTE) SARS-CoV-2 target nucleic acids are NOT DETECTED.  The SARS-CoV-2 RNA is generally detectable in upper respiratory specimens during the acute phase of infection. The lowest concentration of SARS-CoV-2 viral copies this assay can detect is 138  copies/mL. A negative result does not preclude SARS-Cov-2 infection and should not be used as the sole basis for treatment or other patient management decisions. A negative result may occur with  improper specimen collection/handling, submission of specimen other than nasopharyngeal swab, presence of viral mutation(s) within the areas targeted by this assay, and inadequate number of viral copies(<138 copies/mL). A negative result must be combined with clinical observations, patient history, and epidemiological information. The expected result is Negative.  Fact Sheet for Patients:  bloggercourse.com  Fact Sheet for Healthcare Providers:   seriousbroker.it  This test is no t yet approved or cleared by the [place]  FDA and  has been authorized for detection and/or diagnosis of SARS-CoV-2 by FDA under an Emergency Use Authorization (EUA). This EUA will remain  in effect (meaning this test can be used) for the duration of the COVID-19 declaration under Section 564(b)(1) of the Act, 21 U.S.C.section 360bbb-3(b)(1), unless the authorization is terminated  or revoked sooner.       Influenza A by PCR NEGATIVE NEGATIVE Final   Influenza B by PCR NEGATIVE NEGATIVE Final    Comment: (NOTE) The Xpert Xpress SARS-CoV-2/FLU/RSV plus assay is intended as an aid in the diagnosis of influenza from Nasopharyngeal swab specimens and should not be used as a sole basis for treatment. Nasal washings and aspirates are unacceptable for Xpert Xpress SARS-CoV-2/FLU/RSV testing.  Fact Sheet for Patients: bloggercourse.com  Fact Sheet for Healthcare Providers: seriousbroker.it  This test is not yet approved or cleared by the [place]  FDA and has been authorized for detection and/or diagnosis of SARS-CoV-2 by FDA under an Emergency Use Authorization (EUA). This EUA will remain in effect (meaning this test can be used) for the duration of the COVID-19 declaration under Section 564(b)(1) of the Act, 21 U.S.C. section 360bbb-3(b)(1), unless the authorization is terminated or revoked.     Resp Syncytial Virus by PCR NEGATIVE NEGATIVE Final    Comment: (NOTE) Fact Sheet for Patients: bloggercourse.com  Fact Sheet for Healthcare Providers: seriousbroker.it  This test is not yet approved or cleared by the [place]  FDA and has been authorized for detection and/or diagnosis of SARS-CoV-2 by FDA under an Emergency Use Authorization (EUA). This EUA will remain in effect (meaning this test can be used) for  the duration of the COVID-19 declaration under Section 564(b)(1) of the Act, 21 U.S.C. section 360bbb-3(b)(1), unless the authorization is terminated or revoked.  Performed at [facility], [address] [place], [place] [address]   MRSA Next Gen by PCR, Nasal     Status: None   Collection Time: 11/18/24  6:30 PM   Specimen: Nasal Mucosa; Nasal Swab  Result Value Ref Range Status   MRSA by PCR Next Gen NOT DETECTED NOT DETECTED Final    Comment: (NOTE) The GeneXpert MRSA Assay (FDA approved for NASAL specimens only), is one component of a comprehensive MRSA colonization surveillance program. It is not intended to diagnose MRSA infection nor to guide or monitor treatment for MRSA infections. Test performance is not FDA approved in patients less than 72 years old. Performed at [facility], [address] [place], [place] [address]    *Note: Due to a large number of results and/or encounters for the requested time period, some results have not been displayed. A complete set of results can be found in Results Review.    Coagulation Studies: Recent Labs    11/18/24 0405  LABPROT 18.8*  INR 1.5*    Urinalysis: No  results for input(s): COLORURINE, LABSPEC, PHURINE, GLUCOSEU, HGBUR, BILIRUBINUR, KETONESUR, PROTEINUR, UROBILINOGEN, NITRITE, LEUKOCYTESUR in the last 72 hours.  Invalid input(s): APPERANCEUR    Imaging: DG Chest Port 1 View Result Date: 11/18/2024 CLINICAL DATA:  Acute respiratory failure, hypoxia EXAM: PORTABLE CHEST 1 VIEW COMPARISON:  11/18/2024 FINDINGS: Single frontal view of the chest demonstrates a stable enlarged cardiac silhouette. Central pulmonary vascular congestion with patchy perihilar ground-glass airspace disease consistent with edema. No effusion or pneumothorax. Left chest wall port unchanged. No acute bony abnormalities. IMPRESSION: 1. Mild congestive heart failure. Electronically Signed   By: Ozell Daring M.D.   On: 11/18/2024 18:50   DG Chest 1 View Result Date: 11/18/2024 EXAM: 1 VIEW(S) XRAY OF THE CHEST 11/18/2024 06:22:00 AM COMPARISON: 11/17/2024 CLINICAL HISTORY: The patient has congestive heart failure (CHF). ICD10: 02706 - Congestive heart failure. FINDINGS: LINES, TUBES AND DEVICES: Stable left chest port. Cardiac monitoring devices noted. LUNGS AND PLEURA: No focal pulmonary opacity. No pleural effusion. No pneumothorax. HEART AND MEDIASTINUM: Stable cardiomegaly. BONES AND SOFT TISSUES: No acute osseous abnormality. IMPRESSION: 1. Stable cardiomegaly. 2. No acute findings. Electronically signed by: Waddell Calk MD 11/18/2024 06:35 AM EST RP Workstation: [id number]   DG Chest Portable 1 View Result Date: 11/17/2024 CLINICAL DATA:  Dyspnea EXAM: PORTABLE CHEST 1 VIEW COMPARISON:  Mar 13, 2024 FINDINGS: Stable cardiomegaly. Left subclavian Port-A-Cath is unchanged. Both lungs are clear. The visualized skeletal structures are unremarkable. IMPRESSION: No active disease. Electronically Signed   By: Lynwood Landy Raddle M.D.   On: 11/17/2024 12:12     Medications:    cefTRIAXone  (ROCEPHIN )  IV Stopped (11/17/24 1917)   phenylephrine  (NEO-SYNEPHRINE) Adult infusion 40 mcg/min (11/19/24 0719)   sodium chloride       allopurinol   50 mg Oral Daily   aspirin  EC  81 mg Oral Daily   Chlorhexidine  Gluconate Cloth  6 each Topical Daily   gabapentin   200 mg Oral BID   ipratropium-albuterol   3 mL Nebulization Q6H   methylPREDNISolone  (SOLU-MEDROL ) injection  40 mg Intravenous Daily   midodrine   10 mg Oral TID   pantoprazole  (PROTONIX ) IV  40 mg Intravenous Q12H   sodium bicarbonate   650 mg Oral BID   tamsulosin   0.4 mg Oral BID   acetaminophen  **OR** acetaminophen , albuterol , cyclobenzaprine , guaiFENesin -dextromethorphan , ondansetron  **OR** ondansetron  (ZOFRAN ) IV, traZODone   Assessment/ Plan:  69 y.o. 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  1.  Acute kidney injury/chronic kidney disease stage IV baseline creatinine 2.8 with a eGFR of 24.  Acute kidney injury now likely due to ATN from acute blood loss.  Creatinine down slightly to 3.57 with eGFR 18.  No immediate need for dialysis.  Continue supportive care with phenylephrine  to support blood pressure.  2.  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  Patient underwent EGD 11/18/2024.  He has received multiple blood transfusions this admission.  Monitor CBC.  3.  Hypotension.  Maintain the patient on midodrine  10 mg 3 times daily.   LOS: 2 Aaleyah Witherow 1/15/20267:58 AM   "

## 2024-11-19 NOTE — Progress Notes (Signed)
 "    Chris Copping, MD [facility]   [address] [place], [place] [address] Phone: [phone] Fax : [phone]   Subjective: The patient had an upper endoscopy with cautery of some AVMs in the small bowel and the stomach.  The patient had a brown bowel movement today with no further black stools.  The patient did have a blood transfusion and his hemoglobin went from 6.8 yesterday to 7.9 today.   Objective: Vital signs in last 24 hours: Vitals:   11/19/24 1015 11/19/24 1030 11/19/24 1045 11/19/24 1100  BP: (!) 107/52 (!) 96/54 (!) 101/58 (!) 80/56  Pulse: (!) 50 (!) 52 (!) 51 (!) 48  Resp: 15 13 18 14   Temp:      TempSrc:      SpO2: 100% 99% 100% 99%  Weight:      Height:       Weight change:   Intake/Output Summary (Last 24 hours) at 11/19/2024 1519 Last data filed at 11/19/2024 1405 Gross per 24 hour  Intake 1768.64 ml  Output 600 ml  Net 1168.64 ml     Exam: General: The patient is sitting up in a chair in no apparent distress and reports that he is feeling better.   Lab Results: @LABTEST2 @ Micro Results: Recent Results (from the past 240 hours)  Resp panel by RT-PCR (RSV, Flu A&B, Covid) Anterior Nasal Swab     Status: None   Collection Time: 11/17/24 12:30 PM   Specimen: Anterior Nasal Swab  Result Value Ref Range Status   SARS Coronavirus 2 by RT PCR NEGATIVE NEGATIVE Final    Comment: (NOTE) SARS-CoV-2 target nucleic acids are NOT DETECTED.  The SARS-CoV-2 RNA is generally detectable in upper respiratory specimens during the acute phase of infection. The lowest concentration of SARS-CoV-2 viral copies this assay can detect is 138 copies/mL. A negative result does not preclude SARS-Cov-2 infection and should not be used as the sole basis for treatment or other patient management decisions. A negative result may occur with  improper specimen collection/handling, submission of specimen other than nasopharyngeal swab, presence of viral mutation(s)  within the areas targeted by this assay, and inadequate number of viral copies(<138 copies/mL). A negative result must be combined with clinical observations, patient history, and epidemiological information. The expected result is Negative.  Fact Sheet for Patients:  bloggercourse.com  Fact Sheet for Healthcare Providers:  seriousbroker.it  This test is no t yet approved or cleared by the [place]  FDA and  has been authorized for detection and/or diagnosis of SARS-CoV-2 by FDA under an Emergency Use Authorization (EUA). This EUA will remain  in effect (meaning this test can be used) for the duration of the COVID-19 declaration under Section 564(b)(1) of the Act, 21 U.S.C.section 360bbb-3(b)(1), unless the authorization is terminated  or revoked sooner.       Influenza A by PCR NEGATIVE NEGATIVE Final   Influenza B by PCR NEGATIVE NEGATIVE Final    Comment: (NOTE) The Xpert Xpress SARS-CoV-2/FLU/RSV plus assay is intended as an aid in the diagnosis of influenza from Nasopharyngeal swab specimens and should not be used as a sole basis for treatment. Nasal washings and aspirates are unacceptable for Xpert Xpress SARS-CoV-2/FLU/RSV testing.  Fact Sheet for Patients: bloggercourse.com  Fact Sheet for Healthcare Providers: seriousbroker.it  This test is not yet approved or cleared by the [place]  FDA and has been authorized for detection and/or diagnosis of SARS-CoV-2 by FDA under an Emergency Use Authorization (EUA). This EUA  will remain in effect (meaning this test can be used) for the duration of the COVID-19 declaration under Section 564(b)(1) of the Act, 21 U.S.C. section 360bbb-3(b)(1), unless the authorization is terminated or revoked.     Resp Syncytial Virus by PCR NEGATIVE NEGATIVE Final    Comment: (NOTE) Fact Sheet for  Patients: bloggercourse.com  Fact Sheet for Healthcare Providers: seriousbroker.it  This test is not yet approved or cleared by the [place]  FDA and has been authorized for detection and/or diagnosis of SARS-CoV-2 by FDA under an Emergency Use Authorization (EUA). This EUA will remain in effect (meaning this test can be used) for the duration of the COVID-19 declaration under Section 564(b)(1) of the Act, 21 U.S.C. section 360bbb-3(b)(1), unless the authorization is terminated or revoked.  Performed at [facility], [address] [place], [place] [address]   MRSA Next Gen by PCR, Nasal     Status: None   Collection Time: 11/18/24  6:30 PM   Specimen: Nasal Mucosa; Nasal Swab  Result Value Ref Range Status   MRSA by PCR Next Gen NOT DETECTED NOT DETECTED Final    Comment: (NOTE) The GeneXpert MRSA Assay (FDA approved for NASAL specimens only), is one component of a comprehensive MRSA colonization surveillance program. It is not intended to diagnose MRSA infection nor to guide or monitor treatment for MRSA infections. Test performance is not FDA approved in patients less than 32 years old. Performed at [facility], [address] [place], [place] [address]    Studies/Results: ECHOCARDIOGRAM COMPLETE Result Date: 11/19/2024    ECHOCARDIOGRAM REPORT   Patient Name:   Chris George Date of Exam: 11/19/2024 Medical Rec #:  [id number]      Height:       69.0 in Accession #:    [id number]     Weight:       191.0 lb Date of Birth:  [birth year]       BSA:          2.026 m Patient Age:    69 years       BP:           104/59 mmHg Patient Gender: M              HR:           41 bpm. Exam Location:  [facility] Procedure: 2D Echo, Cardiac Doppler and Color Doppler (Both Spectral and Color            Flow Doppler were utilized during procedure). Indications:     CHF-acute diastolic I50.31  History:         Patient has no prior  history of Echocardiogram examinations.  Sonographer:     Christopher Furnace Referring Phys:  [id number] INGE JONETTA LECHER Diagnosing Phys: Evalene Lunger MD IMPRESSIONS  1. Left ventricular ejection fraction, by estimation, is 40 to 45%. The left ventricle has mildly decreased function. The left ventricle demonstrates global hypokinesis. There is moderate left ventricular hypertrophy. Left ventricular diastolic parameters are indeterminate.  2. Right ventricular systolic function is moderately reduced. The right ventricular size is moderately enlarged. There is moderately elevated pulmonary artery systolic pressure.  3. Right atrial size was severely dilated.  4. The mitral valve is normal in structure. No evidence of mitral valve regurgitation. No evidence of mitral stenosis. Severe mitral annular calcification.  5. Tricuspid valve regurgitation is mild to moderate.  6. The aortic valve is tricuspid. Aortic valve regurgitation is not visualized. No aortic  stenosis is present.  7. Mild pulmonic stenosis.  8. The inferior vena cava is normal in size with greater than 50% respiratory variability, suggesting right atrial pressure of 3 mmHg. FINDINGS  Left Ventricle: Left ventricular ejection fraction, by estimation, is 40 to 45%. The left ventricle has mildly decreased function. The left ventricle demonstrates global hypokinesis. Strain was performed and the global longitudinal strain is indeterminate. The left ventricular internal cavity size was normal in size. There is moderate left ventricular hypertrophy. Left ventricular diastolic parameters are indeterminate. Right Ventricle: The right ventricular size is moderately enlarged. No increase in right ventricular wall thickness. Right ventricular systolic function is moderately reduced. There is moderately elevated pulmonary artery systolic pressure. The tricuspid  regurgitant velocity is 3.21 m/s, and with an assumed right atrial pressure of 5 mmHg, the estimated right  ventricular systolic pressure is 46.2 mmHg. Left Atrium: Left atrial size was normal in size. Right Atrium: Right atrial size was severely dilated. Pericardium: There is no evidence of pericardial effusion. Mitral Valve: The mitral valve is normal in structure. Severe mitral annular calcification. No evidence of mitral valve regurgitation. No evidence of mitral valve stenosis. Tricuspid Valve: The tricuspid valve is normal in structure. Tricuspid valve regurgitation is mild to moderate. No evidence of tricuspid stenosis. Aortic Valve: The aortic valve is tricuspid. Aortic valve regurgitation is not visualized. No aortic stenosis is present. Aortic valve mean gradient measures 3.0 mmHg. Aortic valve peak gradient measures 5.2 mmHg. Aortic valve area, by VTI measures 3.00 cm. Pulmonic Valve: The pulmonic valve was normal in structure. Pulmonic valve regurgitation is mild. Mild pulmonic stenosis. Aorta: The aortic root is normal in size and structure. Venous: The inferior vena cava is normal in size with greater than 50% respiratory variability, suggesting right atrial pressure of 3 mmHg. IAS/Shunts: No atrial level shunt detected by color flow Doppler. Additional Comments: 3D was performed not requiring image post processing on an independent workstation and was indeterminate.  LEFT VENTRICLE PLAX 2D LVIDd:         5.30 cm   Diastology LVIDs:         3.70 cm   LV e' medial:    4.35 cm/s LV PW:         1.70 cm   LV E/e' medial:  30.8 LV IVS:        1.70 cm   LV e' lateral:   8.05 cm/s LVOT diam:     2.30 cm   LV E/e' lateral: 16.6 LV SV:         59 LV SV Index:   29 LVOT Area:     4.15 cm  LEFT ATRIUM           Index        RIGHT ATRIUM           Index LA diam:      5.10 cm 2.52 cm/m   RA Area:     78.40 cm LA Vol (A4C): 81.6 ml 40.28 ml/m  RA Volume:   489.00 ml 241.37 ml/m  AORTIC VALVE                    PULMONIC VALVE AV Area (Vmax):    2.85 cm     PR End Diast Vel: 9.49 msec AV Area (Vmean):   2.74 cm AV  Area (VTI):     3.00 cm AV Vmax:           114.00 cm/s AV Vmean:  75.700 cm/s AV VTI:            0.195 m AV Peak Grad:      5.2 mmHg AV Mean Grad:      3.0 mmHg LVOT Vmax:         78.20 cm/s LVOT Vmean:        50.000 cm/s LVOT VTI:          0.141 m LVOT/AV VTI ratio: 0.72 MITRAL VALVE                TRICUSPID VALVE MV Area (PHT): 2.62 cm     TR Peak grad:   41.2 mmHg MV Decel Time: 290 msec     TR Vmax:        321.00 cm/s MV E velocity: 134.00 cm/s                             SHUNTS                             Systemic VTI:  0.14 m                             Systemic Diam: 2.30 cm Evalene Lunger MD Electronically signed by Evalene Lunger MD Signature Date/Time: 11/19/2024/1:17:58 PM    Final    DG Chest Port 1 View Result Date: 11/18/2024 CLINICAL DATA:  Acute respiratory failure, hypoxia EXAM: PORTABLE CHEST 1 VIEW COMPARISON:  11/18/2024 FINDINGS: Single frontal view of the chest demonstrates a stable enlarged cardiac silhouette. Central pulmonary vascular congestion with patchy perihilar ground-glass airspace disease consistent with edema. No effusion or pneumothorax. Left chest wall port unchanged. No acute bony abnormalities. IMPRESSION: 1. Mild congestive heart failure. Electronically Signed   By: Ozell Daring M.D.   On: 11/18/2024 18:50   DG Chest 1 View Result Date: 11/18/2024 EXAM: 1 VIEW(S) XRAY OF THE CHEST 11/18/2024 06:22:00 AM COMPARISON: 11/17/2024 CLINICAL HISTORY: The patient has congestive heart failure (CHF). ICD10: 02706 - Congestive heart failure. FINDINGS: LINES, TUBES AND DEVICES: Stable left chest port. Cardiac monitoring devices noted. LUNGS AND PLEURA: No focal pulmonary opacity. No pleural effusion. No pneumothorax. HEART AND MEDIASTINUM: Stable cardiomegaly. BONES AND SOFT TISSUES: No acute osseous abnormality. IMPRESSION: 1. Stable cardiomegaly. 2. No acute findings. Electronically signed by: Waddell Calk MD 11/18/2024 06:35 AM EST RP Workstation: [id number]    Medications: I have reviewed the patient's current medications. Scheduled Meds:  allopurinol   50 mg Oral Daily   aspirin  EC  81 mg Oral Daily   Chlorhexidine  Gluconate Cloth  6 each Topical Daily   gabapentin   200 mg Oral BID   methylPREDNISolone  (SOLU-MEDROL ) injection  40 mg Intravenous Daily   midodrine   10 mg Oral TID   pantoprazole  (PROTONIX ) IV  40 mg Intravenous Q12H   sodium bicarbonate   650 mg Oral BID   tamsulosin   0.4 mg Oral BID   Continuous Infusions:  phenylephrine  (NEO-SYNEPHRINE) Adult infusion 10 mcg/min (11/19/24 1406)   PRN Meds:.acetaminophen  **OR** acetaminophen , albuterol , cyclobenzaprine , guaiFENesin -dextromethorphan , ondansetron  **OR** ondansetron  (ZOFRAN ) IV, traZODone    Assessment: Principal Problem:   Lower GI bleed Active Problems:   Melena   AVM (arteriovenous malformation) of small bowel, acquired   Gastrointestinal hemorrhage with melena   Palliative care encounter    Plan: The patient has improved respiratory status after his EGD yesterday.  The patient's  hemoglobin is higher today after transfusion.  The patient also had brown stools.  The patient had a capsule endoscopy that is being downloaded and will be read when that is complete.  The patient has been explained the plan and agrees with it.   LOS: 2 days   Chris Copping, MD.FACG 11/19/2024, 3:19 PM Pager [phone] 7am-5pm  Check AMION for 5pm -7am coverage and on weekends  "

## 2024-11-19 NOTE — Progress Notes (Signed)
 "  NAME:  Chris George, MRN:  [id number], DOB:  1955-12-16, LOS: 2 ADMISSION DATE:  11/17/2024, CONSULTATION DATE:  11/18/2024 REFERRING MD:  Dr. Jinny, CHIEF COMPLAINT:  Hypotension    Brief Pt Description / Synopsis:  69 y.o. male with PMHx most significant for right sided CHF, A. Fib on Eliquis , colon and prostrate cancer status chemo and radiation therapy, and multiple myeloma admitted with Acute Blood Loss Anemia due to Acute Upper GI Bleed, along with AKI.  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  Course complicated by hypotension requiring vasopressors.   History of Present Illness:  Chris George is a 69 y.o. male with medical history significant of ASD status post closure, chronic right-sided CHF, PAF on Eliquis , chronic hypotension on midodrine , mitral regurgitation status post repair in December 2025, colon cancer status post partial colectomy, on chemotherapy, prostate cancer status post radiation therapy, smoldering multiple myeloma, chronic anemia, CKD stage IV, GERD presented with 7 days of black tarry stool and fatigue generalized weakness.   Symptoms started 7 days ago, patient started noticed dark-colored stool 1-2 episodes a day, increasing has been feeling fatigue and general weakness but denied any shortness of breath no chest pains or lightheadedness.  Yesterday, patient started to feel increasing shortness of breath and came to ED, but before workup was done patient signed AMA.  This morning he came back again for persistent feeling of fatigue and shortness of breath.   ED Course: Initial Vital Signs:  Afebrile, bradycardia blood pressure 90/40, O2 saturation 100% on room air.  Significant Labs:  hemoglobin 5.0 compared to baseline 9.0-10, WBC 2.1 platelet 94 BUN 92 creatinine 3.5 compared to baseline 2.4-2.8  glucose 130 bicarb 25K 3.7. Imaging Chest X-ray>>cardiomegaly but no acute infiltrates  Medications Administered:  PRBC x 2 ordered. Patient was given PPI x 1 in the ED.   [facility] asked to admit for further workup and treatment.  GI was consulted.   Please see Significant Hospital Events section below for full detailed hospital course.  11/19/24- patient with multiple severe comorbid conditions poor prognosis. Still critically ill on vasopressors with neosynephrine.  Was seen by palliative and oncology today and wishes to stay full code.    Pertinent  Medical History   Past Medical History:  Diagnosis Date   A-fib (HCC)    Acute kidney injury superimposed on chronic kidney disease 11/19/2023   Acute on chronic heart failure (HCC) 11/19/2023   Acute on chronic heart failure with preserved ejection fraction (HCC) 11/19/2023   Anemia    Arthritis    ankles   Cancer (HCC)    multiple myeloma   Cancer of transverse colon (HCC) 10/26/2019   Cardiomyopathy (HCC)    Chemotherapy-induced peripheral neuropathy    CHF (congestive heart failure) (HCC)    Depression    Dysrhythmia    atrial fib   Elevated troponin 11/19/2023   History of CVA (cerebrovascular accident)    HLD (hyperlipidemia)    Hypercholesteremia    Hypertension    Moderate tricuspid insufficiency    Multiple myeloma (HCC)    not being treated right now per pt 11/11/19   Pneumonia    Priapism 07/25/2012   Severe mitral regurgitation 09/13/2024   Sleep apnea    No CPAP.  OSA resolved with wt loss.    Micro Data:  1/13:  COVID/flu/RSV PCR>> negative 1/14: MRSA PCR>>  Antimicrobials:   Anti-infectives (From admission, onward)    Start     Dose/Rate Route Frequency Ordered Stop   11/17/24 1400  cefTRIAXone  (ROCEPHIN ) 2 g in sodium chloride  0.9 % 100 mL IVPB        2 g 200 mL/hr over 30 Minutes Intravenous Every 24 hours 11/17/24 1228         Significant Hospital Events: Including procedures, antibiotic start  and stop dates in addition to other pertinent events   1/13: Admitted by [facility] for acute GI Bleed and AKI.  Ordered for 2 units pRBC's. GI consulted. 1/14: Ordered for 1 additional unit of blood. Taken for EGD.  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  Hypotensive requiring vasopressors, transferred to ICU.  PCCM consulted. Consult Nephrology for AKI.  Interim History / Subjective:  As outlined above under Significant Hospital Events section  Objective   Blood pressure (!) 80/56, pulse (!) 48, temperature 98.7 F (37.1 C), temperature source Axillary, resp. rate 14, height 5' 9 (1.753 m), weight 86.6 kg, SpO2 99%.        Intake/Output Summary (Last 24 hours) at 11/19/2024 1111 Last data filed at 11/19/2024 1039 Gross per 24 hour  Intake 1488.64 ml  Output 600 ml  Net 888.64 ml   Filed Weights   11/17/24 1107  Weight: 86.6 kg    Examination: General: Acute on chronically ill-appearing male, laying in bed, on 2 L nasal cannula, with mildly increased work of breathing HENT: Atraumatic, normocephalic, neck supple, no JVD Lungs: Inspiratory and expiratory wheezing throughout, even, mild tachypnea Cardiovascular: Regular rate and rhythm, S1-S2, no murmurs rubs, gallops Abdomen: Soft, nontender, nondistended, no guarding, tenderness, bowel sounds positive x4 Extremities: Normal bulk and tone, no deformities, 1+ edema bilateral lower extremities Neuro: Awake and alert, oriented x 4, moves all extremities to commands, no focal deficits, from prior TIA with difficulty finding words GU: Deferred  Resolved Hospital Problem list     Assessment & Plan:   #Shock: Hypovolemic/Hemorrhagic  #Chronic Right sided CHF PMHx: Hypotension, PAF on Eliquis  Echocardiogram 06/15/24: LVEF 55%, unable to assess diastolic function,  severe mitral regurgitation,  RV severely dilated with normal systolic function, moderate to severe tricuspid regurgitation, moderate pulmonary hypertension, mild to moderate pulmonic regurgitation -Continuous cardiac monitoring -Maintain MAP >65 -Cautious IV fluids -Transfusions as indicated -Vasopressors as needed to maintain MAP goal -Trend lactic acid until normalized -Trend HS Troponin until peaked -Echocardiogram pending -Diuresis as BP and renal function permits ~ holding now due to shock and AKI  #Acute Blood Loss Anemia due to ... #Acute GI Bleed (Hematochezia) #Pancytopenia, suspect due to chemo PMHx: Colon and prostrate cancer Status post EGD on 1/14: A few gastric polyps. Two non-bleeding angiodysplastic lesions in the stomach. Treated with argon plasma coagulation (APC). A few recently bleeding angiodysplastic lesions in the duodenum. Treated with argon plasma coagulation (APC). -Monitor for S/Sx of bleeding -Trend CBC -SCD's for VTE Prophylaxis  -Transfuse for Hgb <7 -Transfuse Platelets for Platelet count < 10K; < 50K with active bleeding; < 100K with Neurosurgical procedures/processes  -GI following, appreciate input -Follow up with Oncology outpatient   #Acute Hypoxic Respiratory Failure, suspect due to pulmonary edema -Supplemental O2 as needed to maintain O2 sats >92% -BiPAP if needed -Follow intermittent Chest X-ray & ABG as needed -Bronchodilators -IV Steroids -Diuresis as BP  and renal function permits ~ holding for now due to shock and AKI -Pulmonary toilet as able  #AKI on CKD Stage IV -Monitor I&O's / urinary output -Follow BMP -Ensure adequate renal perfusion -Avoid nephrotoxic agents as able -Replace electrolytes as indicated ~ Pharmacy following for assistance with electrolyte replacement -Consult Nephrology, appreciate input          Best Practice (right click and Reselect all SmartList Selections daily)   Diet/type: NPO DVT prophylaxis: SCD GI prophylaxis:  PPI Lines: Left Chest port accessed, and is still needed Foley:  N/A Code Status:  full code Last date of multidisciplinary goals of care discussion [N/A]  1/14: Pt updated at bedside on plan of care.  Labs   CBC: Recent Labs  Lab 11/17/24 1002 11/17/24 2049 11/18/24 0405 11/18/24 0735 11/18/24 2216 11/19/24 0727  WBC 2.1*  --  2.5*  --  4.4 3.8*  NEUTROABS 1.3*  --   --   --   --   --   HGB 5.0* 5.3* 6.2* 6.3* 6.8* 7.9*  HCT 16.3* 17.1* 19.6* 20.0* 21.1* 24.4*  MCV 93.1  --  92.5  --  91.3 91.4  PLT 94*  --  105*  --  102* 115*    Basic Metabolic Panel: Recent Labs  Lab 11/17/24 1002 11/18/24 0405 11/18/24 2216 11/19/24 0727  NA 135 136 133* 133*  K 3.7 4.2 4.0 4.1  CL 98 98 96* 97*  CO2 25 27 23 23   GLUCOSE 130* 130* 114* 167*  BUN 92* 94* 91* 89*  CREATININE 3.51* 3.70* 3.57* 3.35*  CALCIUM  8.5* 8.3* 8.2* 8.5*  MG 2.1  --   --   --   PHOS  --   --   --  5.6*   GFR: Estimated Creatinine Clearance: 23 mL/min (A) (by C-G formula based on SCr of 3.35 mg/dL (H)). Recent Labs  Lab 11/17/24 1002 11/18/24 0405 11/18/24 1909 11/18/24 2216 11/19/24 0727  WBC 2.1* 2.5*  --  4.4 3.8*  LATICACIDVEN  --   --  1.7 1.5  --     Liver Function Tests: Recent Labs  Lab 11/17/24 1002 11/18/24 0405 11/19/24 0727  AST 32 27  --   ALT 17 15  --   ALKPHOS 286* 260*  --   BILITOT 0.4 1.5*  --   PROT 6.2* 6.0*  --   ALBUMIN 3.2* 3.1* 3.1*   No results for input(s): LIPASE, AMYLASE in the last 168 hours. No results for input(s): AMMONIA in the last 168 hours.  ABG    Component Value Date/Time   HCO3 31.9 (H) 11/22/2022 1221   O2SAT 97.2 11/22/2022 1221     Coagulation Profile: Recent Labs  Lab 11/18/24 0405  INR 1.5*    Cardiac Enzymes: No results for input(s): CKTOTAL, CKMB, CKMBINDEX, TROPONINI in the last 168 hours.  HbA1C: Hemoglobin A1C  Date/Time Value Ref Range Status  01/05/2012 02:49 PM 6.7 (H) 4.2 - 6.3 % Final    Comment:     The American Diabetes Association recommends that a primary goal of therapy should be <7% and that physicians should reevaluate the treatment regimen in patients with HbA1c values consistently >8%.     CBG: Recent Labs  Lab 11/18/24 1827  GLUCAP 111*    Review of Systems:   Positives in BOLD: Gen: Denies fever, chills, weight change, fatigue, night sweats HEENT: Denies blurred vision, double vision, hearing loss, tinnitus, sinus congestion, rhinorrhea, sore throat, neck stiffness, dysphagia PULM:  Denies shortness of breath, cough, sputum production, hemoptysis, wheezing CV: Denies chest pain, edema, orthopnea, paroxysmal nocturnal dyspnea, palpitations GI: Denies abdominal pain, nausea, vomiting, diarrhea, hematochezia, melena, constipation, change in bowel habits GU: Denies dysuria, hematuria, polyuria, oliguria, urethral discharge Endocrine: Denies hot or cold intolerance, polyuria, polyphagia or appetite change Derm: Denies rash, dry skin, scaling or peeling skin change Heme: Denies easy bruising, bleeding, bleeding gums Neuro: Denies headache, numbness, weakness, slurred speech, loss of memory or consciousness   Past Medical History:  He,  has a past medical history of A-fib (HCC), Acute kidney injury superimposed on chronic kidney disease (11/19/2023), Acute on chronic heart failure (HCC) (11/19/2023), Acute on chronic heart failure with preserved ejection fraction (HCC) (11/19/2023), Anemia, Arthritis, Cancer (HCC), Cancer of transverse colon (HCC) (10/26/2019), Cardiomyopathy (HCC), Chemotherapy-induced peripheral neuropathy, CHF (congestive heart failure) (HCC), Depression, Dysrhythmia, Elevated troponin (11/19/2023), History of CVA (cerebrovascular accident), HLD (hyperlipidemia), Hypercholesteremia, Hypertension, Moderate tricuspid insufficiency, Multiple myeloma (HCC), Pneumonia, Priapism (07/25/2012), Severe mitral regurgitation (09/13/2024), and Sleep apnea.   Surgical  History:   Past Surgical History:  Procedure Laterality Date   ATRIAL ABLATION SURGERY     CARDIAC SURGERY     A-Fib Ablations   COLONOSCOPY N/A 03/17/2024   Procedure: COLONOSCOPY;  Surgeon: Jinny Carmine, MD;  Location: [facility] ENDOSCOPY;  Service: Endoscopy;  Laterality: N/A;   COLONOSCOPY  11/18/2024   Procedure: COLONOSCOPY, WITH ARGON PLASMA COAGULATION;  Surgeon: Jinny Carmine, MD;  Location: [facility] ENDOSCOPY;  Service: Endoscopy;;   COLONOSCOPY WITH PROPOFOL  N/A 10/26/2019   Procedure: COLONOSCOPY WITH BIOPSY;  Surgeon: Jinny Carmine, MD;  Location: [facility] SURGERY [facility];  Service: Endoscopy;  Laterality: N/A;   COLONOSCOPY WITH PROPOFOL  N/A 09/03/2022   Procedure: COLONOSCOPY WITH PROPOFOL ;  Surgeon: Jinny Carmine, MD;  Location: [facility];  Service: Endoscopy;  Laterality: N/A;   ESOPHAGOGASTRODUODENOSCOPY N/A 03/17/2024   Procedure: GI Bleeding;  Surgeon: Jinny Carmine, MD;  Location: [facility] ENDOSCOPY;  Service: Endoscopy;  Laterality: N/A;  GI Bleeding, EGD   ESOPHAGOGASTRODUODENOSCOPY N/A 11/18/2024   Procedure: EGD (ESOPHAGOGASTRODUODENOSCOPY);  Surgeon: Jinny Carmine, MD;  Location: [facility] ENDOSCOPY;  Service: Endoscopy;  Laterality: N/A;   ESOPHAGOGASTRODUODENOSCOPY (EGD) WITH PROPOFOL  N/A 10/26/2019   Procedure: ESOPHAGOGASTRODUODENOSCOPY (EGD) WITH BIOPSY;  Surgeon: Jinny Carmine, MD;  Location: [facility] SURGERY [facility]TR;  Service: Endoscopy;  Laterality: N/A;  sleep apnea   GIVENS CAPSULE STUDY  11/18/2024   Procedure: IMAGING PROCEDURE, GI TRACT, INTRALUMINAL, VIA CAPSULE;  Surgeon: Jinny Carmine, MD;  Location: [facility] ENDOSCOPY;  Service: Endoscopy;;   LAPAROSCOPIC PARTIAL COLECTOMY Right 11/17/2019   Procedure: LAPAROSCOPIC PARTIAL COLECTOMY RIGHT EXTENDED;  Surgeon: Desiderio Schanz, MD;  Location: [facility] ORS;  Service: General;  Laterality: Right;   PORTACATH PLACEMENT N/A 12/28/2019   Procedure: INSERTION PORT-A-CATH Left subclavian;  Surgeon: Desiderio Schanz, MD;  Location: [facility] ORS;  Service:  General;  Laterality: N/A;     Social History:   reports that he has never smoked. He has never been exposed to tobacco smoke. He has never used smokeless tobacco. He reports that he does not currently use alcohol. He reports that he does not use drugs.   Family History:  His family history includes Colon cancer in his brother and father; Healthy in his mother; Parkinson's disease in his father.   Allergies Allergies[1]   Home Medications  Prior to Admission medications  Medication Sig Start Date End Date Taking? Authorizing Provider  allopurinol  (ZYLOPRIM ) 100 MG tablet Take 50 mg by mouth daily. 11/12/24  Yes [provider]  apixaban  (ELIQUIS ) 5 MG TABS tablet Take 5 mg by mouth 2 (two) times daily.   Yes [provider]  atorvastatin  (LIPITOR) 80 MG tablet Take 80 mg by mouth daily.   Yes [provider]  bumetanide  (BUMEX ) 2 MG tablet Take 1.5 mg by mouth daily. Taking one in the morning. 12/17/23 12/11/24 Yes [provider]  calcitRIOL  (ROCALTROL ) 0.25 MCG capsule Take 0.25 mcg by mouth daily. 06/22/24 06/22/25 Yes [provider]  colchicine  0.6 MG tablet Take 1 tablet (0.6 mg total) by mouth daily as needed. Until flare resolves. 03/17/24  Yes Jens Durand, MD  cyclobenzaprine  (FLEXERIL ) 5 MG tablet take 1 tablet by mouth three times daily as needed for muscle spasm for up to 10 days 09/29/24  Yes [provider]  diphenoxylate -atropine  (LOMOTIL ) 2.5-0.025 MG tablet Take 1 tablet by mouth 4 (four) times daily as needed for diarrhea or loose stools. 08/22/24  Yes Borders, Fonda SAUNDERS, NP  gabapentin  (NEURONTIN ) 100 MG capsule Take 2 capsules (200 mg total) by mouth 2 (two) times daily. 08/02/24  Yes Wouk, Devaughn Sayres, MD  hydrocortisone  (ANUSOL -HC) 2.5 % rectal cream Place 1 Application rectally 2 (two) times daily. 05/21/24  Yes Brimage, Vondra, DO  magnesium  chloride (SLOW-MAG) 64 MG TBEC SR tablet Take 1 tablet (64 mg total) by mouth  daily. 11/08/23  Yes Borders, Fonda SAUNDERS, NP  midodrine  (PROAMATINE ) 10 MG tablet Take 10 mg by mouth. 10/26/24  Yes [provider]  Multiple Vitamin (MULTIVITAMIN ADULT PO) Take 1 tablet by mouth daily.   Yes [provider]  oxyCODONE -acetaminophen  (PERCOCET/ROXICET) 5-325 MG tablet Take 1-2 tablets by mouth every 6 (six) hours as needed for severe pain (pain score 7-10). 04/29/24  Yes [name], MD  potassium chloride  SA (KLOR-CON  M) 20 MEQ tablet Take 20 mEq by mouth daily. 11/03/24  Yes [provider]  PSYLLIUM PO Take 1 packet by mouth.   Yes [provider]  sodium bicarbonate  650 MG tablet Take 1 tablet (650 mg total) by mouth 2 (two) times daily. 08/02/24 08/02/25 Yes Wouk, Devaughn Sayres, MD  tamsulosin  (FLOMAX ) 0.4 MG CAPS capsule Take 1 capsule (0.4 mg total) by mouth 2 (two) times daily. 10/06/24  Yes Francisca Redell BROCKS, MD  lidocaine -prilocaine  (EMLA ) cream Apply 1 Application topically daily. Patient not taking: Reported on 11/17/2024    [provider]  ondansetron  (ZOFRAN ) 8 MG tablet Take 1 tablet (8 mg total) by mouth every 8 (eight) hours as needed for nausea or vomiting. Patient not taking: Reported on 11/17/2024 05/05/24   [name], MD  prochlorperazine  (COMPAZINE ) 10 MG tablet Take 1 tablet (10 mg total) by mouth every 6 (six) hours as needed for nausea or vomiting. Patient not taking: Reported on 11/17/2024 05/05/24   Finnegan, Timothy J, MD  [Paused] spironolactone (ALDACTONE) 25 MG tablet Take 1 tablet by mouth every morning. Wait to take this until your doctor or other care provider tells you to start again. 12/17/23 12/11/24  [provider]  valACYclovir  (VALTREX ) 1000 MG tablet Take 1 tablet (1,000 mg total) by mouth daily. Patient not taking: Reported on 11/17/2024 08/21/24   Borders, Fonda SAUNDERS, NP     Critical care provider statement:   Total critical care time: 33 minutes   Performed by: Parris MD    Critical care time was exclusive of separately billable procedures and treating other patients.   Critical care was necessary to treat or prevent imminent or life-threatening deterioration.  Critical care was time spent personally by me on the following activities: development of treatment plan with patient and/or surrogate as well as nursing, discussions with consultants, evaluation of patient's response to treatment, examination of patient, obtaining history from patient or surrogate, ordering and performing treatments and interventions, ordering and review of laboratory studies, ordering and review of radiographic studies, pulse oximetry and re-evaluation of patient's condition.    Ahley Bulls, M.D.  Pulmonary & Critical Care Medicine           [1] No Known Allergies  "

## 2024-11-20 ENCOUNTER — Other Ambulatory Visit: Payer: Self-pay | Admitting: *Deleted

## 2024-11-20 DIAGNOSIS — D649 Anemia, unspecified: Secondary | ICD-10-CM

## 2024-11-20 DIAGNOSIS — C184 Malignant neoplasm of transverse colon: Secondary | ICD-10-CM

## 2024-11-20 LAB — TYPE AND SCREEN
ABO/RH(D): O POS
Antibody Screen: NEGATIVE
Unit division: 0
Unit division: 0
Unit division: 0
Unit division: 0

## 2024-11-20 LAB — BPAM RBC
Blood Product Expiration Date: 202601182359
Blood Product Expiration Date: 202601262359
Blood Product Expiration Date: 202601182359
Blood Product Expiration Date: 202601192359
ISSUE DATE / TIME: 202601141710
ISSUE DATE / TIME: 202601150208
ISSUE DATE / TIME: 202601131434
ISSUE DATE / TIME: 202601132156
Unit Type and Rh: 5100
Unit Type and Rh: 9500
Unit Type and Rh: 5100
Unit Type and Rh: 9500

## 2024-11-20 LAB — RENAL FUNCTION PANEL
Albumin: 3 g/dL — ABNORMAL LOW (ref 3.5–5.0)
Anion gap: 12 (ref 5–15)
BUN: 97 mg/dL — ABNORMAL HIGH (ref 8–23)
CO2: 23 mmol/L (ref 22–32)
Calcium: 8.6 mg/dL — ABNORMAL LOW (ref 8.9–10.3)
Chloride: 98 mmol/L (ref 98–111)
Creatinine, Ser: 3.12 mg/dL — ABNORMAL HIGH (ref 0.61–1.24)
GFR, Estimated: 21 mL/min — ABNORMAL LOW
Glucose, Bld: 139 mg/dL — ABNORMAL HIGH (ref 70–99)
Phosphorus: 4.5 mg/dL (ref 2.5–4.6)
Potassium: 4.1 mmol/L (ref 3.5–5.1)
Sodium: 133 mmol/L — ABNORMAL LOW (ref 135–145)

## 2024-11-20 LAB — CBC
HCT: 24.4 % — ABNORMAL LOW (ref 39.0–52.0)
Hemoglobin: 7.7 g/dL — ABNORMAL LOW (ref 13.0–17.0)
MCH: 28.9 pg (ref 26.0–34.0)
MCHC: 31.6 g/dL (ref 30.0–36.0)
MCV: 91.7 fL (ref 80.0–100.0)
Platelets: 103 K/uL — ABNORMAL LOW (ref 150–400)
RBC: 2.66 MIL/uL — ABNORMAL LOW (ref 4.22–5.81)
RDW: 17.6 % — ABNORMAL HIGH (ref 11.5–15.5)
WBC: 3.4 K/uL — ABNORMAL LOW (ref 4.0–10.5)
nRBC: 0 % (ref 0.0–0.2)

## 2024-11-20 LAB — MAGNESIUM: Magnesium: 2.2 mg/dL (ref 1.7–2.4)

## 2024-11-20 MED ORDER — HEPARIN SOD (PORK) LOCK FLUSH 100 UNIT/ML IV SOLN
500.0000 [IU] | Freq: Once | INTRAVENOUS | Status: AC
  Administered 2024-11-20: 500 [IU] via INTRAVENOUS
  Filled 2024-11-20: qty 5

## 2024-11-20 NOTE — Progress Notes (Signed)
 " [facility]  ROUNDING NOTE   Subjective:   Patient well-known to us  as we follow him for chronic kidney disease stage IV as an outpatient.  Came in with 7 days of black tarry stools and fatigue.  Underwent EGD 11/18/2024.  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  A few recently bleeding angiodysplastic lesions of the duodenum were also noted treated with APC.  Patient status post multiple blood transfusions.  Baseline creatinine 2.8 with eGFR 24.    Update:   Renal function slightly improved today. Creatinine down to 3.12. eGFR up to 21.  Objective:  Vital signs in last 24 hours:  Temp:  [97.5 F (36.4 C)-98.4 F (36.9 C)] 97.5 F (36.4 C) (01/16 1200) Pulse Rate:  [35-91] 57 (01/16 1200) Resp:  [10-18] 14 (01/16 1100) BP: (83-107)/(50-70) 101/64 (01/16 1200) SpO2:  [90 %-100 %] 97 % (01/16 1200)  Weight change:  Filed Weights   11/17/24 1107  Weight: 86.6 kg    Intake/Output: I/O last 3 completed shifts: In: 1784.6 [P.O.:480; I.V.:564.6; Blood:640; IV Piggyback:100] Out: 600 [Urine:600]   Intake/Output this shift:  Total I/O In: 240 [P.O.:240] Out: -   Physical Exam: General: No acute distress  Head: Normocephalic, atraumatic. Moist oral mucosal membranes  Neck: Supple  Lungs:  Clear to auscultation, normal effort  Heart: S1S2 no rubs  Abdomen:  Soft, nontender, bowel sounds present  Extremities: Trace peripheral edema.  Neurologic: Awake, alert, following commands  Skin: No acute rash  Access: No hemodialysis access    Basic Metabolic Panel: Recent Labs  Lab 11/17/24 1002 11/18/24 0405 11/18/24 2216 11/19/24 0727 11/20/24 0345  NA 135 136 133* 133* 133*  K 3.7 4.2 4.0 4.1 4.1  CL 98 98 96* 97* 98  CO2 25 27 23 23 23   GLUCOSE 130* 130* 114* 167* 139*  BUN 92* 94* 91* 89* 97*  CREATININE 3.51* 3.70* 3.57* 3.35* 3.12*  CALCIUM  8.5* 8.3* 8.2* 8.5* 8.6*  MG 2.1  --   --   --  2.2  PHOS  --    --   --  5.6* 4.5    Liver Function Tests: Recent Labs  Lab 11/17/24 1002 11/18/24 0405 11/19/24 0727 11/20/24 0345  AST 32 27  --   --   ALT 17 15  --   --   ALKPHOS 286* 260*  --   --   BILITOT 0.4 1.5*  --   --   PROT 6.2* 6.0*  --   --   ALBUMIN 3.2* 3.1* 3.1* 3.0*   No results for input(s): LIPASE, AMYLASE in the last 168 hours. No results for input(s): AMMONIA in the last 168 hours.  CBC: Recent Labs  Lab 11/17/24 1002 11/17/24 2049 11/18/24 0405 11/18/24 0735 11/18/24 2216 11/19/24 0727 11/20/24 0345  WBC 2.1*  --  2.5*  --  4.4 3.8* 3.4*  NEUTROABS 1.3*  --   --   --   --   --   --   HGB 5.0*   < > 6.2* 6.3* 6.8* 7.9* 7.7*  HCT 16.3*   < > 19.6* 20.0* 21.1* 24.4* 24.4*  MCV 93.1  --  92.5  --  91.3 91.4 91.7  PLT 94*  --  105*  --  102* 115* 103*   < > = values in this interval not displayed.    Cardiac Enzymes: No results for input(s): CKTOTAL, CKMB, CKMBINDEX, TROPONINI in the last 168 hours.  BNP: Invalid input(s): POCBNP  CBG: Recent Labs  Lab 11/18/24 1827  GLUCAP 111*    Microbiology: Results for orders placed or performed during the hospital encounter of 11/17/24  Resp panel by RT-PCR (RSV, Flu A&B, Covid) Anterior Nasal Swab     Status: None   Collection Time: 11/17/24 12:30 PM   Specimen: Anterior Nasal Swab  Result Value Ref Range Status   SARS Coronavirus 2 by RT PCR NEGATIVE NEGATIVE Final    Comment: (NOTE) SARS-CoV-2 target nucleic acids are NOT DETECTED.  The SARS-CoV-2 RNA is generally detectable in upper respiratory specimens during the acute phase of infection. The lowest concentration of SARS-CoV-2 viral copies this assay can detect is 138 copies/mL. A negative result does not preclude SARS-Cov-2 infection and should not be used as the sole basis for treatment or other patient management decisions. A negative result may occur with  improper specimen collection/handling, submission of specimen other than  nasopharyngeal swab, presence of viral mutation(s) within the areas targeted by this assay, and inadequate number of viral copies(<138 copies/mL). A negative result must be combined with clinical observations, patient history, and epidemiological information. The expected result is Negative.  Fact Sheet for Patients:  bloggercourse.com  Fact Sheet for Healthcare Providers:  seriousbroker.it  This test is no t yet approved or cleared by the [place]  FDA and  has been authorized for detection and/or diagnosis of SARS-CoV-2 by FDA under an Emergency Use Authorization (EUA). This EUA will remain  in effect (meaning this test can be used) for the duration of the COVID-19 declaration under Section 564(b)(1) of the Act, 21 U.S.C.section 360bbb-3(b)(1), unless the authorization is terminated  or revoked sooner.       Influenza A by PCR NEGATIVE NEGATIVE Final   Influenza B by PCR NEGATIVE NEGATIVE Final    Comment: (NOTE) The Xpert Xpress SARS-CoV-2/FLU/RSV plus assay is intended as an aid in the diagnosis of influenza from Nasopharyngeal swab specimens and should not be used as a sole basis for treatment. Nasal washings and aspirates are unacceptable for Xpert Xpress SARS-CoV-2/FLU/RSV testing.  Fact Sheet for Patients: bloggercourse.com  Fact Sheet for Healthcare Providers: seriousbroker.it  This test is not yet approved or cleared by the [place]  FDA and has been authorized for detection and/or diagnosis of SARS-CoV-2 by FDA under an Emergency Use Authorization (EUA). This EUA will remain in effect (meaning this test can be used) for the duration of the COVID-19 declaration under Section 564(b)(1) of the Act, 21 U.S.C. section 360bbb-3(b)(1), unless the authorization is terminated or revoked.     Resp Syncytial Virus by PCR NEGATIVE NEGATIVE Final    Comment:  (NOTE) Fact Sheet for Patients: bloggercourse.com  Fact Sheet for Healthcare Providers: seriousbroker.it  This test is not yet approved or cleared by the [place]  FDA and has been authorized for detection and/or diagnosis of SARS-CoV-2 by FDA under an Emergency Use Authorization (EUA). This EUA will remain in effect (meaning this test can be used) for the duration of the COVID-19 declaration under Section 564(b)(1) of the Act, 21 U.S.C. section 360bbb-3(b)(1), unless the authorization is terminated or revoked.  Performed at [facility], [address] [place], [place] [address]   MRSA Next Gen by PCR, Nasal     Status: None   Collection Time: 11/18/24  6:30 PM   Specimen: Nasal Mucosa; Nasal Swab  Result Value Ref Range Status   MRSA by PCR Next Gen NOT DETECTED NOT DETECTED Final    Comment: (  NOTE) The GeneXpert MRSA Assay (FDA approved for NASAL specimens only), is one component of a comprehensive MRSA colonization surveillance program. It is not intended to diagnose MRSA infection nor to guide or monitor treatment for MRSA infections. Test performance is not FDA approved in patients less than 16 years old. Performed at [facility], [address] [place], [place] [address]    *Note: Due to a large number of results and/or encounters for the requested time period, some results have not been displayed. A complete set of results can be found in Results Review.    Coagulation Studies: Recent Labs    11/18/24 0405  LABPROT 18.8*  INR 1.5*    Urinalysis: No results for input(s): COLORURINE, LABSPEC, PHURINE, GLUCOSEU, HGBUR, BILIRUBINUR, KETONESUR, PROTEINUR, UROBILINOGEN, NITRITE, LEUKOCYTESUR in the last 72 hours.  Invalid input(s): APPERANCEUR    Imaging: ECHOCARDIOGRAM COMPLETE Result Date: 11/19/2024    ECHOCARDIOGRAM REPORT   Patient Name:   Chris George Date  of Exam: 11/19/2024 Medical Rec #:  [id number]      Height:       69.0 in Accession #:    [id number]     Weight:       191.0 lb Date of Birth:  June 01, 1956       BSA:          2.026 m Patient Age:    68 years       BP:           104/59 mmHg Patient Gender: M              HR:           41 bpm. Exam Location:  [facility] Procedure: 2D Echo, Cardiac Doppler and Color Doppler (Both Spectral and Color            Flow Doppler were utilized during procedure). Indications:     CHF-acute diastolic I50.31  History:         Patient has no prior history of Echocardiogram examinations.  Sonographer:     Christopher Furnace Referring Phys:  [id number] INGE JONETTA LECHER Diagnosing Phys: Evalene Lunger MD IMPRESSIONS  1. Left ventricular ejection fraction, by estimation, is 40 to 45%. The left ventricle has mildly decreased function. The left ventricle demonstrates global hypokinesis. There is moderate left ventricular hypertrophy. Left ventricular diastolic parameters are indeterminate.  2. Right ventricular systolic function is moderately reduced. The right ventricular size is moderately enlarged. There is moderately elevated pulmonary artery systolic pressure.  3. Right atrial size was severely dilated.  4. The mitral valve is normal in structure. No evidence of mitral valve regurgitation. No evidence of mitral stenosis. Severe mitral annular calcification.  5. Tricuspid valve regurgitation is mild to moderate.  6. The aortic valve is tricuspid. Aortic valve regurgitation is not visualized. No aortic stenosis is present.  7. Mild pulmonic stenosis.  8. The inferior vena cava is normal in size with greater than 50% respiratory variability, suggesting right atrial pressure of 3 mmHg. FINDINGS  Left Ventricle: Left ventricular ejection fraction, by estimation, is 40 to 45%. The left ventricle has mildly decreased function. The left ventricle demonstrates global hypokinesis. Strain was performed and the global longitudinal strain is indeterminate. The  left ventricular internal cavity size was normal in size. There is moderate left ventricular hypertrophy. Left ventricular diastolic parameters are indeterminate. Right Ventricle: The right ventricular size is moderately enlarged. No increase in right ventricular wall thickness. Right ventricular systolic function is moderately reduced. There  is moderately elevated pulmonary artery systolic pressure. The tricuspid  regurgitant velocity is 3.21 m/s, and with an assumed right atrial pressure of 5 mmHg, the estimated right ventricular systolic pressure is 46.2 mmHg. Left Atrium: Left atrial size was normal in size. Right Atrium: Right atrial size was severely dilated. Pericardium: There is no evidence of pericardial effusion. Mitral Valve: The mitral valve is normal in structure. Severe mitral annular calcification. No evidence of mitral valve regurgitation. No evidence of mitral valve stenosis. Tricuspid Valve: The tricuspid valve is normal in structure. Tricuspid valve regurgitation is mild to moderate. No evidence of tricuspid stenosis. Aortic Valve: The aortic valve is tricuspid. Aortic valve regurgitation is not visualized. No aortic stenosis is present. Aortic valve mean gradient measures 3.0 mmHg. Aortic valve peak gradient measures 5.2 mmHg. Aortic valve area, by VTI measures 3.00 cm. Pulmonic Valve: The pulmonic valve was normal in structure. Pulmonic valve regurgitation is mild. Mild pulmonic stenosis. Aorta: The aortic root is normal in size and structure. Venous: The inferior vena cava is normal in size with greater than 50% respiratory variability, suggesting right atrial pressure of 3 mmHg. IAS/Shunts: No atrial level shunt detected by color flow Doppler. Additional Comments: 3D was performed not requiring image post processing on an independent workstation and was indeterminate.  LEFT VENTRICLE PLAX 2D LVIDd:         5.30 cm   Diastology LVIDs:         3.70 cm   LV e' medial:    4.35 cm/s LV PW:          1.70 cm   LV E/e' medial:  30.8 LV IVS:        1.70 cm   LV e' lateral:   8.05 cm/s LVOT diam:     2.30 cm   LV E/e' lateral: 16.6 LV SV:         59 LV SV Index:   29 LVOT Area:     4.15 cm  LEFT ATRIUM           Index        RIGHT ATRIUM           Index LA diam:      5.10 cm 2.52 cm/m   RA Area:     78.40 cm LA Vol (A4C): 81.6 ml 40.28 ml/m  RA Volume:   489.00 ml 241.37 ml/m  AORTIC VALVE                    PULMONIC VALVE AV Area (Vmax):    2.85 cm     PR End Diast Vel: 9.49 msec AV Area (Vmean):   2.74 cm AV Area (VTI):     3.00 cm AV Vmax:           114.00 cm/s AV Vmean:          75.700 cm/s AV VTI:            0.195 m AV Peak Grad:      5.2 mmHg AV Mean Grad:      3.0 mmHg LVOT Vmax:         78.20 cm/s LVOT Vmean:        50.000 cm/s LVOT VTI:          0.141 m LVOT/AV VTI ratio: 0.72 MITRAL VALVE                TRICUSPID VALVE MV Area (PHT): 2.62 cm     TR  Peak grad:   41.2 mmHg MV Decel Time: 290 msec     TR Vmax:        321.00 cm/s MV E velocity: 134.00 cm/s                             SHUNTS                             Systemic VTI:  0.14 m                             Systemic Diam: 2.30 cm Evalene Lunger MD Electronically signed by Evalene Lunger MD Signature Date/Time: 11/19/2024/1:17:58 PM    Final    DG Chest Port 1 View Result Date: 11/18/2024 CLINICAL DATA:  Acute respiratory failure, hypoxia EXAM: PORTABLE CHEST 1 VIEW COMPARISON:  11/18/2024 FINDINGS: Single frontal view of the chest demonstrates a stable enlarged cardiac silhouette. Central pulmonary vascular congestion with patchy perihilar ground-glass airspace disease consistent with edema. No effusion or pneumothorax. Left chest wall port unchanged. No acute bony abnormalities. IMPRESSION: 1. Mild congestive heart failure. Electronically Signed   By: Ozell Daring M.D.   On: 11/18/2024 18:50     Medications:      allopurinol   50 mg Oral Daily   aspirin  EC  81 mg Oral Daily   Chlorhexidine  Gluconate Cloth  6 each Topical Daily    gabapentin   200 mg Oral BID   methylPREDNISolone  (SOLU-MEDROL ) injection  40 mg Intravenous Daily   midodrine   10 mg Oral TID   pantoprazole  (PROTONIX ) IV  40 mg Intravenous Q12H   sodium bicarbonate   650 mg Oral BID   tamsulosin   0.4 mg Oral BID   acetaminophen  **OR** acetaminophen , albuterol , cyclobenzaprine , guaiFENesin -dextromethorphan , ondansetron  **OR** ondansetron  (ZOFRAN ) IV, traZODone   Assessment/ Plan:  69 y.o. 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  1.  Acute kidney injury/chronic kidney disease stage IV baseline creatinine 2.8 with a eGFR of 24.  Acute kidney injury now likely due to ATN from acute blood loss.  Renal function improved today.  Creatinine down to 3.12 with eGFR 21 which is close to his baseline.  No immediate need for dialysis.  2.  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  Patient underwent EGD 11/18/2024.  He has received multiple blood transfusions this admission.  Hemoglobin currently 7.7.  Continue to monitor CBC.  3.  Hypotension.  Maintain the patient on midodrine  10 mg 3 times daily.   LOS: 3 Bergen Magner 1/16/20264:18 PM   "

## 2024-11-20 NOTE — Progress Notes (Signed)
 Chest port hep locked and de-accessed.

## 2024-11-20 NOTE — Progress Notes (Signed)
 "  NAME:  Chris George, MRN:  [id number], DOB:  1956/06/22, LOS: 3 ADMISSION DATE:  11/17/2024, CONSULTATION DATE:  11/18/2024 REFERRING MD:  Dr. Jinny, CHIEF COMPLAINT:  Hypotension    Brief Pt Description / Synopsis:  69 y.o. male with PMHx most significant for right sided CHF, A. Fib on Eliquis , colon and prostrate cancer status chemo and radiation therapy, and multiple myeloma admitted with Acute Blood Loss Anemia due to Acute Upper GI Bleed, along with AKI.  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  Course complicated by hypotension requiring vasopressors.   History of Present Illness:  Chris George is a 69 y.o. male with medical history significant of ASD status post closure, chronic right-sided CHF, PAF on Eliquis , chronic hypotension on midodrine , mitral regurgitation status post repair in December 2025, colon cancer status post partial colectomy, on chemotherapy, prostate cancer status post radiation therapy, smoldering multiple myeloma, chronic anemia, CKD stage IV, GERD presented with 7 days of black tarry stool and fatigue generalized weakness.   Symptoms started 7 days ago, patient started noticed dark-colored stool 1-2 episodes a day, increasing has been feeling fatigue and general weakness but denied any shortness of breath no chest pains or lightheadedness.  Yesterday, patient started to feel increasing shortness of breath and came to ED, but before workup was done patient signed AMA.  This morning he came back again for persistent feeling of fatigue and shortness of breath.   ED Course: Initial Vital Signs:  Afebrile, bradycardia blood pressure 90/40, O2 saturation 100% on room air.  Significant Labs:  hemoglobin 5.0 compared to baseline 9.0-10, WBC 2.1 platelet 94 BUN 92 creatinine 3.5 compared to baseline 2.4-2.8  glucose 130 bicarb 25K 3.7. Imaging Chest X-ray>>cardiomegaly but no acute infiltrates  Medications Administered:  PRBC x 2 ordered. Patient was given PPI x 1 in the ED.   [facility] asked to admit for further workup and treatment.  GI was consulted.   Please see Significant Hospital Events section below for full detailed hospital course.  11/19/24- patient with multiple severe comorbid conditions poor prognosis. Still critically ill on vasopressors with neosynephrine.  Was seen by palliative and oncology today and wishes to stay full code.  11/20/24- patient wishes to sign out AMA.  He understands that he is critically ill but insists on going home immediately.     Pertinent  Medical History   Past Medical History:  Diagnosis Date   A-fib (HCC)    Acute kidney injury superimposed on chronic kidney disease 11/19/2023   Acute on chronic heart failure (HCC) 11/19/2023   Acute on chronic heart failure with preserved ejection fraction (HCC) 11/19/2023   Anemia    Arthritis    ankles   Cancer (HCC)    multiple myeloma   Cancer of transverse colon (HCC) 10/26/2019   Cardiomyopathy (HCC)    Chemotherapy-induced peripheral neuropathy    CHF (congestive heart failure) (HCC)    Depression    Dysrhythmia    atrial fib   Elevated troponin 11/19/2023   History of CVA (cerebrovascular accident)    HLD (hyperlipidemia)    Hypercholesteremia    Hypertension    Moderate tricuspid insufficiency    Multiple myeloma (HCC)    not being treated right now per pt 11/11/19   Pneumonia    Priapism 07/25/2012   Severe mitral regurgitation  09/13/2024   Sleep apnea    No CPAP.  OSA resolved with wt loss.    Micro Data:  1/13: COVID/flu/RSV PCR>> negative 1/14: MRSA PCR>>  Antimicrobials:   Anti-infectives (From admission, onward)    Start     Dose/Rate Route Frequency Ordered Stop   11/17/24 1400  cefTRIAXone  (ROCEPHIN ) 2 g in sodium chloride  0.9 % 100 mL IVPB  Status:  Discontinued        2  g 200 mL/hr over 30 Minutes Intravenous Every 24 hours 11/17/24 1228 11/19/24 1114       Significant Hospital Events: Including procedures, antibiotic start and stop dates in addition to other pertinent events   1/13: Admitted by [facility] for acute GI Bleed and AKI.  Ordered for 2 units pRBC's. GI consulted. 1/14: Ordered for 1 additional unit of blood. Taken for EGD.  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  Hypotensive requiring vasopressors, transferred to ICU.  PCCM consulted. Consult Nephrology for AKI.  Interim History / Subjective:  As outlined above under Significant Hospital Events section  Objective   Blood pressure 106/63, pulse 66, temperature 98.4 F (36.9 C), temperature source Oral, resp. rate 18, height 5' 9 (1.753 m), weight 86.6 kg, SpO2 100%.        Intake/Output Summary (Last 24 hours) at 11/20/2024 1052 Last data filed at 11/20/2024 0900 Gross per 24 hour  Intake 755.99 ml  Output --  Net 755.99 ml   Filed Weights   11/17/24 1107  Weight: 86.6 kg    Examination: General: Acute on chronically ill-appearing male, laying in bed, on 2 L nasal cannula, with mildly increased work of breathing HENT: Atraumatic, normocephalic, neck supple, no JVD Lungs: Inspiratory and expiratory wheezing throughout, even, mild tachypnea Cardiovascular: Regular rate and rhythm, S1-S2, no murmurs rubs, gallops Abdomen: Soft, nontender, nondistended, no guarding, tenderness, bowel sounds positive x4 Extremities: Normal bulk and tone, no deformities, 1+ edema bilateral lower extremities Neuro: Awake and alert, oriented x 4, moves all extremities to commands, no focal deficits, from prior TIA with difficulty finding words GU: Deferred  Resolved Hospital Problem list     Assessment & Plan:   #Shock: Hypovolemic/Hemorrhagic  #Chronic Right  sided CHF PMHx: Hypotension, PAF on Eliquis  Echocardiogram 06/15/24: LVEF 55%, unable to assess diastolic function,  severe mitral regurgitation, RV severely dilated with normal systolic function, moderate to severe tricuspid regurgitation, moderate pulmonary hypertension, mild to moderate pulmonic regurgitation -Continuous cardiac monitoring -Maintain MAP >65 -Cautious IV fluids -Transfusions as indicated -Vasopressors as needed to maintain MAP goal -Trend lactic acid until normalized -Trend HS Troponin until peaked -Echocardiogram pending -Diuresis as BP and renal function permits ~ holding now due to shock and AKI  #Acute Blood Loss Anemia due to ... #Acute GI Bleed (Hematochezia) #Pancytopenia, suspect due to chemo PMHx: Colon and prostrate cancer Status post EGD on 1/14: A few gastric polyps. Two non-bleeding angiodysplastic lesions in the stomach. Treated with argon plasma coagulation (APC). A few recently bleeding angiodysplastic lesions in the duodenum. Treated with argon plasma coagulation (APC). -Monitor for S/Sx of bleeding -Trend CBC -SCD's for VTE Prophylaxis  -Transfuse for Hgb <7 -Transfuse Platelets for Platelet count < 10K; < 50K with active bleeding; < 100K with Neurosurgical procedures/processes  -GI following, appreciate input -Follow up with Oncology outpatient   #Acute Hypoxic Respiratory Failure, suspect due to pulmonary edema -Supplemental O2  as needed to maintain O2 sats >92% -BiPAP if needed -Follow intermittent Chest X-ray & ABG as needed -Bronchodilators -IV Steroids -Diuresis as BP and renal function permits ~ holding for now due to shock and AKI -Pulmonary toilet as able  #AKI on CKD Stage IV -Monitor I&O's / urinary output -Follow BMP -Ensure adequate renal perfusion -Avoid nephrotoxic agents as able -Replace electrolytes as indicated ~ Pharmacy following for assistance with electrolyte replacement -Consult Nephrology, appreciate input           Best Practice (right click and Reselect all SmartList Selections daily)   Diet/type: NPO DVT prophylaxis: SCD GI prophylaxis: PPI Lines: Left Chest port accessed, and is still needed Foley:  N/A Code Status:  full code Last date of multidisciplinary goals of care discussion [N/A]  1/14: Pt updated at bedside on plan of care.  Labs   CBC: Recent Labs  Lab 11/17/24 1002 11/17/24 2049 11/18/24 0405 11/18/24 0735 11/18/24 2216 11/19/24 0727 11/20/24 0345  WBC 2.1*  --  2.5*  --  4.4 3.8* 3.4*  NEUTROABS 1.3*  --   --   --   --   --   --   HGB 5.0*   < > 6.2* 6.3* 6.8* 7.9* 7.7*  HCT 16.3*   < > 19.6* 20.0* 21.1* 24.4* 24.4*  MCV 93.1  --  92.5  --  91.3 91.4 91.7  PLT 94*  --  105*  --  102* 115* 103*   < > = values in this interval not displayed.    Basic Metabolic Panel: Recent Labs  Lab 11/17/24 1002 11/18/24 0405 11/18/24 2216 11/19/24 0727 11/20/24 0345  NA 135 136 133* 133* 133*  K 3.7 4.2 4.0 4.1 4.1  CL 98 98 96* 97* 98  CO2 25 27 23 23 23   GLUCOSE 130* 130* 114* 167* 139*  BUN 92* 94* 91* 89* 97*  CREATININE 3.51* 3.70* 3.57* 3.35* 3.12*  CALCIUM  8.5* 8.3* 8.2* 8.5* 8.6*  MG 2.1  --   --   --  2.2  PHOS  --   --   --  5.6* 4.5   GFR: Estimated Creatinine Clearance: 24.7 mL/min (A) (by C-G formula based on SCr of 3.12 mg/dL (H)). Recent Labs  Lab 11/18/24 0405 11/18/24 1909 11/18/24 2216 11/19/24 0727 11/20/24 0345  WBC 2.5*  --  4.4 3.8* 3.4*  LATICACIDVEN  --  1.7 1.5  --   --     Liver Function Tests: Recent Labs  Lab 11/17/24 1002 11/18/24 0405 11/19/24 0727 11/20/24 0345  AST 32 27  --   --   ALT 17 15  --   --   ALKPHOS 286* 260*  --   --   BILITOT 0.4 1.5*  --   --   PROT 6.2* 6.0*  --   --   ALBUMIN 3.2* 3.1* 3.1* 3.0*   No results for input(s): LIPASE, AMYLASE in the last 168 hours. No results for input(s): AMMONIA in the last 168 hours.  ABG    Component Value Date/Time   HCO3 31.9 (H)  11/22/2022 1221   O2SAT 97.2 11/22/2022 1221     Coagulation Profile: Recent Labs  Lab 11/18/24 0405  INR 1.5*    Cardiac Enzymes: No results for input(s): CKTOTAL, CKMB, CKMBINDEX, TROPONINI in the last 168 hours.  HbA1C: Hemoglobin A1C  Date/Time Value Ref Range Status  01/05/2012 02:49 PM 6.7 (H) 4.2 - 6.3 % Final    Comment:  The American Diabetes Association recommends that a primary goal of therapy should be <7% and that physicians should reevaluate the treatment regimen in patients with HbA1c values consistently >8%.     CBG: Recent Labs  Lab 11/18/24 1827  GLUCAP 111*    Review of Systems:   Positives in BOLD: Gen: Denies fever, chills, weight change, fatigue, night sweats HEENT: Denies blurred vision, double vision, hearing loss, tinnitus, sinus congestion, rhinorrhea, sore throat, neck stiffness, dysphagia PULM: Denies shortness of breath, cough, sputum production, hemoptysis, wheezing CV: Denies chest pain, edema, orthopnea, paroxysmal nocturnal dyspnea, palpitations GI: Denies abdominal pain, nausea, vomiting, diarrhea, hematochezia, melena, constipation, change in bowel habits GU: Denies dysuria, hematuria, polyuria, oliguria, urethral discharge Endocrine: Denies hot or cold intolerance, polyuria, polyphagia or appetite change Derm: Denies rash, dry skin, scaling or peeling skin change Heme: Denies easy bruising, bleeding, bleeding gums Neuro: Denies headache, numbness, weakness, slurred speech, loss of memory or consciousness   Past Medical History:  He,  has a past medical history of A-fib (HCC), Acute kidney injury superimposed on chronic kidney disease (11/19/2023), Acute on chronic heart failure (HCC) (11/19/2023), Acute on chronic heart failure with preserved ejection fraction (HCC) (11/19/2023), Anemia, Arthritis, Cancer (HCC), Cancer of transverse colon (HCC) (10/26/2019), Cardiomyopathy (HCC), Chemotherapy-induced peripheral neuropathy,  CHF (congestive heart failure) (HCC), Depression, Dysrhythmia, Elevated troponin (11/19/2023), History of CVA (cerebrovascular accident), HLD (hyperlipidemia), Hypercholesteremia, Hypertension, Moderate tricuspid insufficiency, Multiple myeloma (HCC), Pneumonia, Priapism (07/25/2012), Severe mitral regurgitation (09/13/2024), and Sleep apnea.   Surgical History:   Past Surgical History:  Procedure Laterality Date   ATRIAL ABLATION SURGERY     CARDIAC SURGERY     A-Fib Ablations   COLONOSCOPY N/A 03/17/2024   Procedure: COLONOSCOPY;  Surgeon: Jinny Carmine, MD;  Location: [facility] END[facility];  Service: Endoscopy;  Laterality: N/A;   COLONOSCOPY  11/18/2024   Procedure: COLONOSCOPY, WITH ARGON PLASMA COAGULATION;  Surgeon: Jinny Carmine, MD;  Location: [facility] ENDOSCOPY;  Service: Endoscopy;;   COLONOSCOPY WITH PROPOFOL  N/A 10/26/2019   Procedure: COLONOSCOPY WITH BIOPSY;  Surgeon: Jinny Carmine, MD;  Location: [facility];  Service: Endoscopy;  Laterality: N/A;   COLONOSCOPY WITH PROPOFOL  N/A 09/03/2022   Procedure: COLONOSCOPY WITH PROPOFOL ;  Surgeon: Jinny Carmine, MD;  Location: [facility];  Service: Endoscopy;  Laterality: N/A;   ESOPHAGOGASTRODUODENOSCOPY N/A 03/17/2024   Procedure: GI Bleeding;  Surgeon: Jinny Carmine, MD;  Location: [facility] ENDOSCOPY;  Service: Endoscopy;  Laterality: N/A;  GI Bleeding, EGD   ESOPHAGOGASTRODUODENOSCOPY N/A 11/18/2024   Procedure: EGD (ESOPHAGOGASTRODUODENOSCOPY);  Surgeon: Jinny Carmine, MD;  Location: [facility] END[facility]COPY;  Service: Endoscopy;  Laterality: N/A;   ESOPHAGOGASTRODUODENOSCOPY (EGD) WITH PROPOFOL  N/A 10/26/2019   Procedure: ESOPHAGOGASTRODUODENOSCOPY (EGD) WITH BIOPSY;  Surgeon: Jinny Carmine, MD;  Location: [facility];  Service: Endoscopy;  Laterality: N/A;  sleep apnea   GIVENS CAPSULE STUDY  11/18/2024   Procedure: IMAGING PROCEDURE, GI TRACT, INTRALUMINAL, VIA CAPSULE;  Surgeon: Jinny Carmine, MD;  Location: [facility] ENDOSCOPY;  Service:  Endoscopy;;   LAPAROSCOPIC PARTIAL COLECTOMY Right 11/17/2019   Procedure: LAPAROSCOPIC PARTIAL COLECTOMY RIGHT EXTENDED;  Surgeon: Desiderio Schanz, MD;  Location: [facility] ORS;  Service: General;  Laterality: Right;   PORTACATH PLACEMENT N/A 12/28/2019   Procedure: INSERTION PORT-A-CATH Left subclavian;  Surgeon: Desiderio Schanz, MD;  Location: [facility] ORS;  Service: General;  Laterality: N/A;     Social History:   reports that he has never smoked. He has never been exposed to tobacco smoke. He has never used smokeless tobacco. He reports that  he does not currently use alcohol. He reports that he does not use drugs.   Family History:  His family history includes Colon cancer in his brother and father; Healthy in his mother; Parkinson's disease in his father.   Allergies Allergies[1]   Home Medications  Prior to Admission medications  Medication Sig Start Date End Date Taking? Authorizing Provider  allopurinol  (ZYLOPRIM ) 100 MG tablet Take 50 mg by mouth daily. 11/12/24  Yes [provider]  apixaban  (ELIQUIS ) 5 MG TABS tablet Take 5 mg by mouth 2 (two) times daily.   Yes [provider]  atorvastatin  (LIPITOR) 80 MG tablet Take 80 mg by mouth daily.   Yes [provider]  bumetanide  (BUMEX ) 2 MG tablet Take 1.5 mg by mouth daily. Taking one in the morning. 12/17/23 12/11/24 Yes [provider]  calcitRIOL  (ROCALTROL ) 0.25 MCG capsule Take 0.25 mcg by mouth daily. 06/22/24 06/22/25 Yes [provider]  colchicine  0.6 MG tablet Take 1 tablet (0.6 mg total) by mouth daily as needed. Until flare resolves. 03/17/24  Yes Jens Durand, MD  cyclobenzaprine  (FLEXERIL ) 5 MG tablet take 1 tablet by mouth three times daily as needed for muscle spasm for up to 10 days 09/29/24  Yes [provider]  diphenoxylate -atropine  (LOMOTIL ) 2.5-0.025 MG tablet Take 1 tablet by mouth 4 (four) times daily as needed for diarrhea or loose stools. 08/22/24  Yes Borders, Fonda SAUNDERS, NP   gabapentin  (NEURONTIN ) 100 MG capsule Take 2 capsules (200 mg total) by mouth 2 (two) times daily. 08/02/24  Yes Wouk, Devaughn Sayres, MD  hydrocortisone  (ANUSOL -HC) 2.5 % rectal cream Place 1 Application rectally 2 (two) times daily. 05/21/24  Yes Brimage, Vondra, DO  magnesium  chloride (SLOW-MAG) 64 MG TBEC SR tablet Take 1 tablet (64 mg total) by mouth daily. 11/08/23  Yes Borders, Fonda SAUNDERS, NP  midodrine  (PROAMATINE ) 10 MG tablet Take 10 mg by mouth. 10/26/24  Yes [provider]  Multiple Vitamin (MULTIVITAMIN ADULT PO) Take 1 tablet by mouth daily.   Yes [provider]  oxyCODONE -acetaminophen  (PERCOCET/ROXICET) 5-325 MG tablet Take 1-2 tablets by mouth every 6 (six) hours as needed for severe pain (pain score 7-10). 04/29/24  Yes Finnegan, Evalene PARAS, MD  potassium chloride  SA (KLOR-CON  M) 20 MEQ tablet Take 20 mEq by mouth daily. 11/03/24  Yes [provider]  PSYLLIUM PO Take 1 packet by mouth.   Yes [provider]  sodium bicarbonate  650 MG tablet Take 1 tablet (650 mg total) by mouth 2 (two) times daily. 08/02/24 08/02/25 Yes Wouk, Devaughn Sayres, MD  tamsulosin  (FLOMAX ) 0.4 MG CAPS capsule Take 1 capsule (0.4 mg total) by mouth 2 (two) times daily. 10/06/24  Yes Francisca Redell BROCKS, MD  lidocaine -prilocaine  (EMLA ) cream Apply 1 Application topically daily. Patient not taking: Reported on 11/17/2024    [provider]  ondansetron  (ZOFRAN ) 8 MG tablet Take 1 tablet (8 mg total) by mouth every 8 (eight) hours as needed for nausea or vomiting. Patient not taking: Reported on 11/17/2024 05/05/24   [name], MD  prochlorperazine  (COMPAZINE ) 10 MG tablet Take 1 tablet (10 mg total) by mouth every 6 (six) hours as needed for nausea or vomiting. Patient not taking: Reported on 11/17/2024 05/05/24   Finnegan, Timothy J, MD  [Paused] spironolactone (ALDACTONE) 25 MG tablet Take 1 tablet by mouth every morning. Wait to take this until your doctor or other care  provider tells you to start again. 12/17/23 12/11/24  [provider]  valACYclovir  (VALTREX ) 1000 MG tablet Take 1 tablet (1,000 mg total) by mouth daily. Patient not taking: Reported on 11/17/2024 08/21/24   Borders, Fonda SAUNDERS, NP     Critical care provider statement:   Total critical care time: 33 minutes   Performed by: Parris MD   Critical care time was exclusive of separately billable procedures and treating other patients.   Critical care was necessary to treat or prevent imminent or life-threatening deterioration.   Critical care was time spent personally by me on the following activities: development of treatment plan with patient and/or surrogate as well as nursing, discussions with consultants, evaluation of patient's response to treatment, examination of patient, obtaining history from patient or surrogate, ordering and performing treatments and interventions, ordering and review of laboratory studies, ordering and review of radiographic studies, pulse oximetry and re-evaluation of patient's condition.    Murlene Revell, M.D.  Pulmonary & Critical Care Medicine            [1] No Known Allergies  "

## 2024-11-20 NOTE — Progress Notes (Signed)
" °   11/20/24 1327  AMA Documentaiton   Current Attending Notified Parris Manna, MD  Comment Patient left AMA, physician notified.  AMA Documentation  AMA form signed    "

## 2024-11-20 NOTE — Progress Notes (Signed)
 SPIRITUAL CARE AND COUNSELING CONSULT NOTE   VISIT SUMMARY   Chaplain took Advance Directives to pt and he said he would have them filled out at his bank  SPIRITUAL ENCOUNTER                                                                                                                                                                      Type of Visit: Initial Care provided to:: Patient Conversation partners present during encounter: Nurse Referral source: Nurse (RN/NT/LPN) Reason for visit: Advance directives   SPIRITUAL FRAMEWORK      GOALS       INTERVENTIONS   Spiritual Care Interventions Made: Established relationship of care and support    INTERVENTION OUTCOMES   Outcomes: Connection to spiritual care  SPIRITUAL CARE PLAN   Spiritual Care Issues Still Outstanding: No further spiritual care needs at this time (see row info)    If immediate needs arise, please contact [facility] 24 hour on call [phone]   Sari Hugh, Chaplain  11/20/2024 9:53 AM

## 2024-11-20 NOTE — Discharge Summary (Signed)
 Physician Discharge Summary  Patient ID: Chris George MRN: [id number] DOB/AGE: 07-04-1956 69 y.o.  Admit date: 11/17/2024 Discharge date: 11/20/2024   Brief Pt Description / Synopsis:  69 y.o. male with PMHx most significant for right sided CHF, A. Fib on Eliquis , colon and prostrate cancer status chemo and radiation therapy, and multiple myeloma admitted with Acute Blood Loss Anemia due to Acute Upper GI Bleed, along with AKI. 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 Course complicated by hemorrhagic shock briefly requiring vasopressors.  Capsule endoscopy showed fresh and old blood throughout the small bowel. There was a large clot with fresh blood around it in the mid small bowel associated with what appears to be a possible metastatic lesion. There are other multiple bleeding sites throughout the small bowel from both AVMs and abnormal growth seen.   Discharge Diagnoses:    PT LEAVING AGAINST MEDICAL ADVICE  Patient is awake, alert, oriented x4.  Have discussed with him that as a medical team he is NOT medically cleared yet for discharge, and if he wishes to leave at this time, it would be against medical advice.  He understands in that leaving he is willing to take the risk that he could decompensate with worsening AKI and develop GI bleeding could, which could even result in death.  He understands these risks and is willing to take them and leave at this time.  AMA paperwork signed and placed in chart by RN.                                                            Discharge Summary:  Chris George is a 69 y.o. male with medical history significant of ASD status post closure, chronic right-sided CHF, PAF on Eliquis , chronic hypotension on midodrine , mitral regurgitation status post repair in December 2025, colon cancer  status post partial colectomy, on chemotherapy, prostate cancer status post radiation therapy, smoldering multiple myeloma, chronic anemia, CKD stage IV, GERD presented with 7 days of black tarry stool and fatigue generalized weakness.   Symptoms started 7 days ago, patient started noticed dark-colored stool 1-2 episodes a day, increasing has been feeling fatigue and general weakness but denied any shortness of breath no chest pains or lightheadedness.  Yesterday, patient started to feel increasing shortness of breath and came to ED, but before workup was done patient signed AMA.  This morning he came back again for persistent feeling of fatigue and shortness of breath.     ED Course: Initial Vital Signs:  Afebrile, bradycardia blood pressure 90/40, O2 saturation 100% on room air.  Significant Labs:  hemoglobin 5.0 compared to baseline 9.0-10, WBC 2.1 platelet 94 BUN 92 creatinine 3.5 compared to baseline 2.4-2.8 glucose 130 bicarb 25K 3.7. Imaging Chest X-ray>>cardiomegaly but no acute infiltrates  Medications Administered:  PRBC x 2 ordered. Patient was given PPI x 1 in the ED.    [facility] asked to admit for further workup and treatment.  GI was consulted.    Please see Significant Hospital Events section below for full detailed hospital course.  Significant Events:  1/13: Admitted by [facility] for acute GI Bleed and AKI.  Ordered for  2 units pRBC's. GI consulted. 1/14: Ordered for 1 additional unit of blood. Taken for EGD.  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  Hypotensive requiring vasopressors, transferred to ICU.  PCCM consulted. Consult Nephrology for AKI. 11/19/24- patient with multiple severe comorbid conditions poor prognosis. Still critically ill on vasopressors with neosynephrine.  Was seen by palliative and oncology today and wishes to stay full  code.  1/16-26: Pt leaving AMA   Vitals:   11/20/24 0900 11/20/24 1000 11/20/24 1100 11/20/24 1200  BP: 106/63 (!) 106/55 (!) 96/50 101/64  Pulse: 66 (!) 49 (!) 55 (!) 57  Resp: 18 12 14    Temp:    (!) 97.5 F (36.4 C)  TempSrc:    Oral  SpO2: 100% 100% 100% 97%  Weight:      Height:         Discharge Labs:   BMET Recent Labs  Lab 11/17/24 1002 11/18/24 0405 11/18/24 2216 11/19/24 0727 11/20/24 0345  NA 135 136 133* 133* 133*  K 3.7 4.2 4.0 4.1 4.1  CL 98 98 96* 97* 98  CO2 25 27 23 23 23   GLUCOSE 130* 130* 114* 167* 139*  BUN 92* 94* 91* 89* 97*  CREATININE 3.51* 3.70* 3.57* 3.35* 3.12*  CALCIUM  8.5* 8.3* 8.2* 8.5* 8.6*  MG 2.1  --   --   --  2.2  PHOS  --   --   --  5.6* 4.5    CBC Recent Labs  Lab 11/18/24 2216 11/19/24 0727 11/20/24 0345  HGB 6.8* 7.9* 7.7*  HCT 21.1* 24.4* 24.4*  WBC 4.4 3.8* 3.4*  PLT 102* 115* 103*    Anti-Coagulation Recent Labs  Lab 11/18/24 0405  INR 1.5*           Disposition: LEAVING AMA     Signed: Inge Lecher, AGACNP-BC [facility] Prefer epic messenger for cross cover needs If after hours, please call E-link

## 2024-11-20 NOTE — Plan of Care (Signed)
 Continuing with plan of care.

## 2024-11-20 NOTE — Progress Notes (Addendum)
 "    Chris Copping, MD [facility]   [address], [address] [place], [place] [address] Phone: [phone] Fax : [phone]   Subjective: The patient had an uneventful the patient had an uneventful night and is resting comfortably this morning.  The patient's hemoglobin yesterday was 7.9 and this morning 7.7.  The patient did have a brown stool yesterday without any black stools. The patient's capsule endoscopy was read and showed fresh and old blood throughout the small bowel.  There was a large clot with fresh blood around it in the mid small bowel associated with what appears to be a possible metastatic lesion.  There are other multiple bleeding sites throughout the small bowel from both AVMs and abnormal growth seen.   Objective: Vital signs in last 24 hours: Vitals:   11/20/24 0100 11/20/24 0200 11/20/24 0300 11/20/24 0400  BP: 101/60 92/64 (!) 88/60 (!) 94/59  Pulse: (!) 42 (!) 35 (!) 39 (!) 36  Resp: 15 10 13 10   Temp:    98.4 F (36.9 C)  TempSrc:    Oral  SpO2: 99% 100% 90% 100%  Weight:      Height:       Weight change:   Intake/Output Summary (Last 24 hours) at 11/20/2024 9276 Last data filed at 11/19/2024 2000 Gross per 24 hour  Intake 572.95 ml  Output 200 ml  Net 372.95 ml     Exam: General: Patient in bed with bradycardia and resting quietly   Lab Results: @LABTEST2 @ Micro Results: Recent Results (from the past 240 hours)  Resp panel by RT-PCR (RSV, Flu A&B, Covid) Anterior Nasal Swab     Status: None   Collection Time: 11/17/24 12:30 PM   Specimen: Anterior Nasal Swab  Result Value Ref Range Status   SARS Coronavirus 2 by RT PCR NEGATIVE NEGATIVE Final    Comment: (NOTE) SARS-CoV-2 target nucleic acids are NOT DETECTED.  The SARS-CoV-2 RNA is generally detectable in upper respiratory specimens during the acute phase of infection. The lowest concentration of SARS-CoV-2 viral copies this assay can detect is 138 copies/mL. A negative result does not  preclude SARS-Cov-2 infection and should not be used as the sole basis for treatment or other patient management decisions. A negative result may occur with  improper specimen collection/handling, submission of specimen other than nasopharyngeal swab, presence of viral mutation(s) within the areas targeted by this assay, and inadequate number of viral copies(<138 copies/mL). A negative result must be combined with clinical observations, patient history, and epidemiological information. The expected result is Negative.  Fact Sheet for Patients:  bloggercourse.com  Fact Sheet for Healthcare Providers:  seriousbroker.it  This test is no t yet approved or cleared by the [place]  FDA and  has been authorized for detection and/or diagnosis of SARS-CoV-2 by FDA under an Emergency Use Authorization (EUA). This EUA will remain  in effect (meaning this test can be used) for the duration of the COVID-19 declaration under Section 564(b)(1) of the Act, 21 U.S.C.section 360bbb-3(b)(1), unless the authorization is terminated  or revoked sooner.       Influenza A by PCR NEGATIVE NEGATIVE Final   Influenza B by PCR NEGATIVE NEGATIVE Final    Comment: (NOTE) The Xpert Xpress SARS-CoV-2/FLU/RSV plus assay is intended as an aid in the diagnosis of influenza from Nasopharyngeal swab specimens and should not be used as a sole basis for treatment. Nasal washings and aspirates are unacceptable for Xpert Xpress SARS-CoV-2/FLU/RSV testing.  Fact Sheet for Patients: bloggercourse.com  Fact Sheet for Healthcare Providers: seriousbroker.it  This test is not yet approved or cleared by the [place]  FDA and has been authorized for detection and/or diagnosis of SARS-CoV-2 by FDA under an Emergency Use Authorization (EUA). This EUA will remain in effect (meaning this test can be used) for the  duration of the COVID-19 declaration under Section 564(b)(1) of the Act, 21 U.S.C. section 360bbb-3(b)(1), unless the authorization is terminated or revoked.     Resp Syncytial Virus by PCR NEGATIVE NEGATIVE Final    Comment: (NOTE) Fact Sheet for Patients: bloggercourse.com  Fact Sheet for Healthcare Providers: seriousbroker.it  This test is not yet approved or cleared by the [place]  FDA and has been authorized for detection and/or diagnosis of SARS-CoV-2 by FDA under an Emergency Use Authorization (EUA). This EUA will remain in effect (meaning this test can be used) for the duration of the COVID-19 declaration under Section 564(b)(1) of the Act, 21 U.S.C. section 360bbb-3(b)(1), unless the authorization is terminated or revoked.  Performed at [facility], [address] [place], [place] [address]   MRSA Next Gen by PCR, Nasal     Status: None   Collection Time: 11/18/24  6:30 PM   Specimen: Nasal Mucosa; Nasal Swab  Result Value Ref Range Status   MRSA by PCR Next Gen NOT DETECTED NOT DETECTED Final    Comment: (NOTE) The GeneXpert MRSA Assay (FDA approved for NASAL specimens only), is one component of a comprehensive MRSA colonization surveillance program. It is not intended to diagnose MRSA infection nor to guide or monitor treatment for MRSA infections. Test performance is not FDA approved in patients less than 62 years old. Performed at [facility], [address] [place], [place] [address]    Studies/Results: ECHOCARDIOGRAM COMPLETE Result Date: 11/19/2024    ECHOCARDIOGRAM REPORT   Patient Name:   Chris George Date of Exam: 11/19/2024 Medical Rec #:  [id number]      Height:       69.0 in Accession #:    [id number]     Weight:       191.0 lb Date of Birth:  January 14, 1956       BSA:          2.026 m Patient Age:    68 years       BP:           104/59 mmHg Patient Gender: M              HR:            41 bpm. Exam Location:  [facility] Procedure: 2D Echo, Cardiac Doppler and Color Doppler (Both Spectral and Color            Flow Doppler were utilized during procedure). Indications:     CHF-acute diastolic I50.31  History:         Patient has no prior history of Echocardiogram examinations.  Sonographer:     Christopher Furnace Referring Phys:  [id number] INGE JONETTA LECHER Diagnosing Phys: Evalene Lunger MD IMPRESSIONS  1. Left ventricular ejection fraction, by estimation, is 40 to 45%. The left ventricle has mildly decreased function. The left ventricle demonstrates global hypokinesis. There is moderate left ventricular hypertrophy. Left ventricular diastolic parameters are indeterminate.  2. Right ventricular systolic function is moderately reduced. The right ventricular size is moderately enlarged. There is moderately elevated pulmonary artery systolic pressure.  3. Right atrial size was severely dilated.  4. The mitral valve is normal in  structure. No evidence of mitral valve regurgitation. No evidence of mitral stenosis. Severe mitral annular calcification.  5. Tricuspid valve regurgitation is mild to moderate.  6. The aortic valve is tricuspid. Aortic valve regurgitation is not visualized. No aortic stenosis is present.  7. Mild pulmonic stenosis.  8. The inferior vena cava is normal in size with greater than 50% respiratory variability, suggesting right atrial pressure of 3 mmHg. FINDINGS  Left Ventricle: Left ventricular ejection fraction, by estimation, is 40 to 45%. The left ventricle has mildly decreased function. The left ventricle demonstrates global hypokinesis. Strain was performed and the global longitudinal strain is indeterminate. The left ventricular internal cavity size was normal in size. There is moderate left ventricular hypertrophy. Left ventricular diastolic parameters are indeterminate. Right Ventricle: The right ventricular size is moderately enlarged. No increase in right ventricular wall thickness.  Right ventricular systolic function is moderately reduced. There is moderately elevated pulmonary artery systolic pressure. The tricuspid  regurgitant velocity is 3.21 m/s, and with an assumed right atrial pressure of 5 mmHg, the estimated right ventricular systolic pressure is 46.2 mmHg. Left Atrium: Left atrial size was normal in size. Right Atrium: Right atrial size was severely dilated. Pericardium: There is no evidence of pericardial effusion. Mitral Valve: The mitral valve is normal in structure. Severe mitral annular calcification. No evidence of mitral valve regurgitation. No evidence of mitral valve stenosis. Tricuspid Valve: The tricuspid valve is normal in structure. Tricuspid valve regurgitation is mild to moderate. No evidence of tricuspid stenosis. Aortic Valve: The aortic valve is tricuspid. Aortic valve regurgitation is not visualized. No aortic stenosis is present. Aortic valve mean gradient measures 3.0 mmHg. Aortic valve peak gradient measures 5.2 mmHg. Aortic valve area, by VTI measures 3.00 cm. Pulmonic Valve: The pulmonic valve was normal in structure. Pulmonic valve regurgitation is mild. Mild pulmonic stenosis. Aorta: The aortic root is normal in size and structure. Venous: The inferior vena cava is normal in size with greater than 50% respiratory variability, suggesting right atrial pressure of 3 mmHg. IAS/Shunts: No atrial level shunt detected by color flow Doppler. Additional Comments: 3D was performed not requiring image post processing on an independent workstation and was indeterminate.  LEFT VENTRICLE PLAX 2D LVIDd:         5.30 cm   Diastology LVIDs:         3.70 cm   LV e' medial:    4.35 cm/s LV PW:         1.70 cm   LV E/e' medial:  30.8 LV IVS:        1.70 cm   LV e' lateral:   8.05 cm/s LVOT diam:     2.30 cm   LV E/e' lateral: 16.6 LV SV:         59 LV SV Index:   29 LVOT Area:     4.15 cm  LEFT ATRIUM           Index        RIGHT ATRIUM           Index LA diam:      5.10 cm  2.52 cm/m   RA Area:     78.40 cm LA Vol (A4C): 81.6 ml 40.28 ml/m  RA Volume:   489.00 ml 241.37 ml/m  AORTIC VALVE                    PULMONIC VALVE AV Area (Vmax):    2.85 cm  PR End Diast Vel: 9.49 msec AV Area (Vmean):   2.74 cm AV Area (VTI):     3.00 cm AV Vmax:           114.00 cm/s AV Vmean:          75.700 cm/s AV VTI:            0.195 m AV Peak Grad:      5.2 mmHg AV Mean Grad:      3.0 mmHg LVOT Vmax:         78.20 cm/s LVOT Vmean:        50.000 cm/s LVOT VTI:          0.141 m LVOT/AV VTI ratio: 0.72 MITRAL VALVE                TRICUSPID VALVE MV Area (PHT): 2.62 cm     TR Peak grad:   41.2 mmHg MV Decel Time: 290 msec     TR Vmax:        321.00 cm/s MV E velocity: 134.00 cm/s                             SHUNTS                             Systemic VTI:  0.14 m                             Systemic Diam: 2.30 cm Evalene Lunger MD Electronically signed by Evalene Lunger MD Signature Date/Time: 11/19/2024/1:17:58 PM    Final    DG Chest Port 1 View Result Date: 11/18/2024 CLINICAL DATA:  Acute respiratory failure, hypoxia EXAM: PORTABLE CHEST 1 VIEW COMPARISON:  11/18/2024 FINDINGS: Single frontal view of the chest demonstrates a stable enlarged cardiac silhouette. Central pulmonary vascular congestion with patchy perihilar ground-glass airspace disease consistent with edema. No effusion or pneumothorax. Left chest wall port unchanged. No acute bony abnormalities. IMPRESSION: 1. Mild congestive heart failure. Electronically Signed   By: Ozell Daring M.D.   On: 11/18/2024 18:50   Medications: I have reviewed the patient's current medications. Scheduled Meds:  allopurinol   50 mg Oral Daily   aspirin  EC  81 mg Oral Daily   Chlorhexidine  Gluconate Cloth  6 each Topical Daily   gabapentin   200 mg Oral BID   methylPREDNISolone  (SOLU-MEDROL ) injection  40 mg Intravenous Daily   midodrine   10 mg Oral TID   pantoprazole  (PROTONIX ) IV  40 mg Intravenous Q12H   sodium bicarbonate   650 mg Oral  BID   tamsulosin   0.4 mg Oral BID   Continuous Infusions:  phenylephrine  (NEO-SYNEPHRINE) Adult infusion Stopped (11/19/24 1506)   PRN Meds:.acetaminophen  **OR** acetaminophen , albuterol , cyclobenzaprine , guaiFENesin -dextromethorphan , ondansetron  **OR** ondansetron  (ZOFRAN ) IV, traZODone    Assessment: Principal Problem:   Lower GI bleed Active Problems:   Melena   AVM (arteriovenous malformation) of small bowel, acquired   Gastrointestinal hemorrhage with melena   Palliative care encounter    Plan: This patient has multiple sites of active bleeding throughout the small bowel from both AVMs and what appears to be metastatic lesions.  These lesions are not amenable to endoscopic treatment saying that they are far down the small bowel and not reachable with the scope.  Angiography should be considered although I am not sure that all of these lesions are  amenable to embolization.  Nothing further to do from a GI point of view.  Dr. Therisa will be on call this weekend covering this patient and he should be contacted if anything GI should arise that we can help with.  I will sign off.  Please call if any further GI concerns or questions.  We would like to thank you for the opportunity to participate in the care of Chris George.    LOS: 3 days   Chris Copping, MD.FACG 11/20/2024, 7:23 AM Pager [phone] 7am-5pm  Check AMION for 5pm -7am coverage and on weekends  "

## 2024-11-20 NOTE — Progress Notes (Signed)
 Patient left AMA despite speaking with provider on importance of continuing stay in the hospital for monitoring.  Patient left with all belongings.

## 2024-11-20 NOTE — Care Management Important Message (Signed)
 Important Message  Patient Details  Name: Chris George MRN: [id number] Date of Birth: July 28, 1956   Important Message Given:  Yes - Medicare IM     Denora Wysocki W, CMA 11/20/2024, 12:23 PM

## 2024-11-23 ENCOUNTER — Inpatient Hospital Stay

## 2024-11-23 ENCOUNTER — Inpatient Hospital Stay: Admitting: Oncology

## 2024-11-23 ENCOUNTER — Inpatient Hospital Stay: Admitting: Hospice and Palliative Medicine

## 2024-11-24 ENCOUNTER — Inpatient Hospital Stay

## 2024-11-24 ENCOUNTER — Inpatient Hospital Stay: Admitting: Oncology

## 2024-11-24 ENCOUNTER — Encounter: Payer: Self-pay | Admitting: Oncology

## 2024-11-24 ENCOUNTER — Inpatient Hospital Stay: Admitting: Hospice and Palliative Medicine

## 2024-11-24 VITALS — BP 91/57 | HR 86 | Temp 97.9°F | Resp 18 | Ht 69.0 in | Wt 192.0 lb

## 2024-11-24 DIAGNOSIS — C184 Malignant neoplasm of transverse colon: Secondary | ICD-10-CM | POA: Diagnosis not present

## 2024-11-24 DIAGNOSIS — Z515 Encounter for palliative care: Secondary | ICD-10-CM | POA: Diagnosis not present

## 2024-11-24 LAB — CBC WITH DIFFERENTIAL (CANCER CENTER ONLY)
Abs Immature Granulocytes: 0.02 K/uL (ref 0.00–0.07)
Basophils Absolute: 0 K/uL (ref 0.0–0.1)
Basophils Relative: 0 %
Eosinophils Absolute: 0.1 K/uL (ref 0.0–0.5)
Eosinophils Relative: 3 %
HCT: 28.5 % — ABNORMAL LOW (ref 39.0–52.0)
Hemoglobin: 8.9 g/dL — ABNORMAL LOW (ref 13.0–17.0)
Immature Granulocytes: 1 %
Lymphocytes Relative: 9 %
Lymphs Abs: 0.4 K/uL — ABNORMAL LOW (ref 0.7–4.0)
MCH: 29.3 pg (ref 26.0–34.0)
MCHC: 31.2 g/dL (ref 30.0–36.0)
MCV: 93.8 fL (ref 80.0–100.0)
Monocytes Absolute: 0.6 K/uL (ref 0.1–1.0)
Monocytes Relative: 14 %
Neutro Abs: 3.2 K/uL (ref 1.7–7.7)
Neutrophils Relative %: 73 %
Platelet Count: 106 K/uL — ABNORMAL LOW (ref 150–400)
RBC: 3.04 MIL/uL — ABNORMAL LOW (ref 4.22–5.81)
RDW: 16.6 % — ABNORMAL HIGH (ref 11.5–15.5)
WBC Count: 4.4 K/uL (ref 4.0–10.5)
nRBC: 0 % (ref 0.0–0.2)

## 2024-11-24 LAB — CMP (CANCER CENTER ONLY)
ALT: 17 U/L (ref 0–44)
AST: 34 U/L (ref 15–41)
Albumin: 3.1 g/dL — ABNORMAL LOW (ref 3.5–5.0)
Alkaline Phosphatase: 249 U/L — ABNORMAL HIGH (ref 38–126)
Anion gap: 9 (ref 5–15)
BUN: 62 mg/dL — ABNORMAL HIGH (ref 8–23)
CO2: 29 mmol/L (ref 22–32)
Calcium: 8.9 mg/dL (ref 8.9–10.3)
Chloride: 100 mmol/L (ref 98–111)
Creatinine: 2.26 mg/dL — ABNORMAL HIGH (ref 0.61–1.24)
GFR, Estimated: 31 mL/min — ABNORMAL LOW
Glucose, Bld: 103 mg/dL — ABNORMAL HIGH (ref 70–99)
Potassium: 4.1 mmol/L (ref 3.5–5.1)
Sodium: 138 mmol/L (ref 135–145)
Total Bilirubin: 0.6 mg/dL (ref 0.0–1.2)
Total Protein: 6.4 g/dL — ABNORMAL LOW (ref 6.5–8.1)

## 2024-11-24 LAB — MAGNESIUM: Magnesium: 1.8 mg/dL (ref 1.7–2.4)

## 2024-11-24 NOTE — Progress Notes (Signed)
 " Chris George  [phone]) [phone] Fax:(336) [phone]  ID: Chris George OB: 1955/12/03  MR#: [id number]  RDW#:[id number]  Patient Care Team: Chris Cheryl FORBES, MD as PCP - General (Family Medicine) Chris Evalene PARAS, MD as Consulting Physician (Oncology) Chris Arthea FORBES, MD as Referring Physician (Neurology) Chris Gales, MD as Consulting Physician (Nephrology) Chris Margery ORN, MD as Referring Physician (Cardiology) Chris Aran, MD as Consulting Physician (Radiation Oncology)  CHIEF COMPLAINT: Recurrent colon cancer, smoldering myeloma, prostate cancer.  INTERVAL HISTORY: Patient returns to clinic today for hospital follow-up and further evaluation.  He was recently admitted with significant GI bleed.  Etiology thought to be secondary to progressive malignancy.  He feels significantly improved and nearly back to his baseline.  He continues to have chronic weakness and fatigue.  Has neuropathy pain is unchanged.  He has a mild expressive aphasia, but no other neurologic complaints.  He denies any recent fevers.  He has no chest pain, cough, shortness of breath, or hemoptysis.  He denies any nausea, vomiting, constipation, or diarrhea.  He has no further melena or hematochezia.  He has no urinary complaints.  Patient offers no further specific complaints today.  REVIEW OF SYSTEMS:   Review of Systems  Constitutional:  Positive for malaise/fatigue. Negative for fever and weight loss.  Respiratory: Negative.  Negative for cough, hemoptysis and shortness of breath.   Cardiovascular: Negative.  Negative for chest pain and leg swelling.  Gastrointestinal: Negative.  Negative for blood in stool, diarrhea, melena, nausea and vomiting.  Genitourinary: Negative.  Negative for frequency and hematuria.  Musculoskeletal: Negative.  Negative for back pain and joint pain.  Skin: Negative.  Negative for rash.  Neurological:  Positive for tingling, sensory change and weakness.  Negative for dizziness, focal weakness and headaches.  Psychiatric/Behavioral: Negative.  Negative for depression and hallucinations. The patient is not nervous/anxious.   Bilateral  As per HPI. Otherwise, a complete review of systems is negative.  PAST MEDICAL HISTORY: Past Medical History:  Diagnosis Date   A-fib (HCC)    Acute kidney injury superimposed on chronic kidney disease 11/19/2023   Acute on chronic heart failure (HCC) 11/19/2023   Acute on chronic heart failure with preserved ejection fraction (HCC) 11/19/2023   Anemia    Arthritis    ankles   Cancer (HCC)    multiple myeloma   Cancer of transverse colon (HCC) 10/26/2019   Cardiomyopathy (HCC)    Chemotherapy-induced peripheral neuropathy    CHF (congestive heart failure) (HCC)    Depression    Dysrhythmia    atrial fib   Elevated troponin 11/19/2023   History of CVA (cerebrovascular accident)    HLD (hyperlipidemia)    Hypercholesteremia    Hypertension    Moderate tricuspid insufficiency    Multiple myeloma (HCC)    not being treated right now per pt 11/11/19   Pneumonia    Priapism 07/25/2012   Severe mitral regurgitation 09/13/2024   Sleep apnea    No CPAP.  OSA resolved with wt loss.    PAST SURGICAL HISTORY: Past Surgical History:  Procedure Laterality Date   ATRIAL ABLATION SURGERY     CARDIAC SURGERY     A-Fib Ablations   COLONOSCOPY N/A 03/17/2024   Procedure: COLONOSCOPY;  Surgeon: Jinny Carmine, MD;  Location: [facility] ENDOSCOPY;  Service: Endoscopy;  Laterality: N/A;   COLONOSCOPY  11/18/2024   Procedure: COLONOSCOPY, WITH ARGON PLASMA COAGULATION;  Surgeon: Jinny Carmine, MD;  Location: [facility] ENDOSCOPY;  Service: Endoscopy;;  COLONOSCOPY WITH PROPOFOL  N/A 10/26/2019   Procedure: COLONOSCOPY WITH BIOPSY;  Surgeon: Jinny Carmine, MD;  Location: [facility];  Service: Endoscopy;  Laterality: N/A;   COLONOSCOPY WITH PROPOFOL  N/A 09/03/2022   Procedure: COLONOSCOPY WITH PROPOFOL ;  Surgeon: Jinny Carmine, MD;  Location: [facility];  Service: Endoscopy;  Laterality: N/A;   ESOPHAGOGASTRODUODENOSCOPY N/A 03/17/2024   Procedure: GI Bleeding;  Surgeon: Jinny Carmine, MD;  Location: [facility] ENDOSCOPY;  Service: Endoscopy;  Laterality: N/A;  GI Bleeding, EGD   ESOPHAGOGASTRODUODENOSCOPY N/A 11/18/2024   Procedure: EGD (ESOPHAGOGASTRODUODENOSCOPY);  Surgeon: Jinny Carmine, MD;  Location: [facility] ENDOSCOPY;  Service: Endoscopy;  Laterality: N/A;   ESOPHAGOGASTRODUODENOSCOPY (EGD) WITH PROPOFOL  N/A 10/26/2019   Procedure: ESOPHAGOGASTRODUODENOSCOPY (EGD) WITH BIOPSY;  Surgeon: Jinny Carmine, MD;  Location: [facility];  Service: Endoscopy;  Laterality: N/A;  sleep apnea   GIVENS CAPSULE STUDY  11/18/2024   Procedure: IMAGING PROCEDURE, GI TRACT, INTRALUMINAL, VIA CAPSULE;  Surgeon: Jinny Carmine, MD;  Location: [facility] ENDOSCOPY;  Service: Endoscopy;;   LAPAROSCOPIC PARTIAL COLECTOMY Right 11/17/2019   Procedure: LAPAROSCOPIC PARTIAL COLECTOMY RIGHT EXTENDED;  Surgeon: Desiderio Schanz, MD;  Location: [facility] ORS;  Service: General;  Laterality: Right;   PORTACATH PLACEMENT N/A 12/28/2019   Procedure: INSERTION PORT-A-CATH Left subclavian;  Surgeon: Desiderio Schanz, MD;  Location: [facility] ORS;  Service: General;  Laterality: N/A;    FAMILY HISTORY: Family History  Problem Relation Age of Onset   Healthy Mother    Colon cancer Father    Parkinson's disease Father    Colon cancer Brother     ADVANCED DIRECTIVES (Y/N):  N  HEALTH MAINTENANCE: Social History   Tobacco Use   Smoking status: Never    Passive exposure: Never   Smokeless tobacco: Never  Vaping Use   Vaping status: Never Used  Substance Use Topics   Alcohol use: Not Currently   Drug use: Never     Colonoscopy:  PAP:  Bone density:  Lipid panel:  No Known Allergies  Current Outpatient Medications  Medication Sig Dispense Refill   allopurinol  (ZYLOPRIM ) 100 MG tablet Take 50 mg by mouth daily.     apixaban  (ELIQUIS ) 5 MG  TABS tablet Take 5 mg by mouth 2 (two) times daily.     atorvastatin  (LIPITOR) 80 MG tablet Take 80 mg by mouth daily.     bumetanide  (BUMEX ) 2 MG tablet Take 1.5 mg by mouth daily. Taking one in the morning.     calcitRIOL  (ROCALTROL ) 0.25 MCG capsule Take 0.25 mcg by mouth daily.     colchicine  0.6 MG tablet Take 1 tablet (0.6 mg total) by mouth daily as needed. Until flare resolves.     cyclobenzaprine  (FLEXERIL ) 5 MG tablet take 1 tablet by mouth three times daily as needed for muscle spasm for up to 10 days     diphenoxylate -atropine  (LOMOTIL ) 2.5-0.025 MG tablet Take 1 tablet by mouth 4 (four) times daily as needed for diarrhea or loose stools. 30 tablet 0   gabapentin  (NEURONTIN ) 100 MG capsule Take 2 capsules (200 mg total) by mouth 2 (two) times daily. 60 capsule 1   hydrocortisone  (ANUSOL -HC) 2.5 % rectal cream Place 1 Application rectally 2 (two) times daily. 30 g 0   lidocaine -prilocaine  (EMLA ) cream Apply 1 Application topically daily.     magnesium  chloride (SLOW-MAG) 64 MG TBEC SR tablet Take 1 tablet (64 mg total) by mouth daily. 60 tablet 1   midodrine  (PROAMATINE ) 10 MG tablet Take 10 mg by mouth.  Multiple Vitamin (MULTIVITAMIN ADULT PO) Take 1 tablet by mouth daily.     ondansetron  (ZOFRAN ) 8 MG tablet Take 1 tablet (8 mg total) by mouth every 8 (eight) hours as needed for nausea or vomiting. 20 tablet 0   oxyCODONE -acetaminophen  (PERCOCET/ROXICET) 5-325 MG tablet Take 1-2 tablets by mouth every 6 (six) hours as needed for severe pain (pain score 7-10). 60 tablet 0   potassium chloride  SA (KLOR-CON  M) 20 MEQ tablet Take 20 mEq by mouth daily.     prochlorperazine  (COMPAZINE ) 10 MG tablet Take 1 tablet (10 mg total) by mouth every 6 (six) hours as needed for nausea or vomiting. 30 tablet 0   PSYLLIUM PO Take 1 packet by mouth.     sodium bicarbonate  650 MG tablet Take 1 tablet (650 mg total) by mouth 2 (two) times daily. 120 tablet 1   tamsulosin  (FLOMAX ) 0.4 MG CAPS capsule  Take 1 capsule (0.4 mg total) by mouth 2 (two) times daily. 90 capsule 7   valACYclovir  (VALTREX ) 1000 MG tablet Take 1 tablet (1,000 mg total) by mouth daily. 10 tablet 0   [Paused] spironolactone (ALDACTONE) 25 MG tablet Take 1 tablet by mouth every morning. (Patient not taking: Reported on 11/24/2024)     No current facility-administered medications for this visit.   Facility-Administered Medications Ordered in Other Visits  Medication Dose Route Frequency Provider Last Rate Last Admin   heparin  lock flush 100 unit/mL  500 Units Intravenous Once Conor Lata J, MD        OBJECTIVE: Vitals:   11/24/24 0959  BP: (!) 91/57  Pulse: 86  Resp: 18  Temp: 97.9 F (36.6 C)  SpO2: 100%       Body mass index is 28.35 kg/m.    ECOG FS:1 - Symptomatic but completely ambulatory  General: Well-developed, well-nourished, no acute distress. Eyes: Pink conjunctiva, anicteric sclera. HEENT: Normocephalic, moist mucous membranes. Lungs: No audible wheezing or coughing. Heart: Regular rate and rhythm. Abdomen: Soft, nontender, no obvious distention. Musculoskeletal: No edema, cyanosis, or clubbing. Neuro: Alert, answering all questions appropriately. Cranial nerves grossly intact. Skin: No rashes or petechiae noted. Psych: Normal affect.  LAB RESULTS:  Lab Results  Component Value Date   NA 138 11/24/2024   K 4.1 11/24/2024   CL 100 11/24/2024   CO2 29 11/24/2024   GLUCOSE 103 (H) 11/24/2024   BUN 62 (H) 11/24/2024   CREATININE 2.26 (H) 11/24/2024   CALCIUM  8.9 11/24/2024   PROT 6.4 (L) 11/24/2024   ALBUMIN 3.1 (L) 11/24/2024   AST 34 11/24/2024   ALT 17 11/24/2024   ALKPHOS 249 (H) 11/24/2024   BILITOT 0.6 11/24/2024   GFRNONAA 31 (L) 11/24/2024   GFRAA >60 07/29/2020    Lab Results  Component Value Date   WBC 4.4 11/24/2024   NEUTROABS 3.2 11/24/2024   HGB 8.9 (L) 11/24/2024   HCT 28.5 (L) 11/24/2024   MCV 93.8 11/24/2024   PLT 106 (L) 11/24/2024   Lab Results   Component Value Date   IRON 214 (H) 11/18/2024   TIBC 270 11/18/2024   IRONPCTSAT 79 (H) 11/18/2024   Lab Results  Component Value Date   FERRITIN 1,307 (H) 11/18/2024     STUDIES: ECHOCARDIOGRAM COMPLETE Result Date: 11/19/2024    ECHOCARDIOGRAM REPORT   Patient Name:   FERNADO BRIGANTE Ruelas Date of Exam: 11/19/2024 Medical Rec #:  [id number]      Height:       69.0 in Accession #:  [id number]     Weight:       191.0 lb Date of Birth:  [birth year]       BSA:          2.026 m Patient Age:    68 years       BP:           104/59 mmHg Patient Gender: M              HR:           41 bpm. Exam Location:  [facility] Procedure: 2D Echo, Cardiac Doppler and Color Doppler (Both Spectral and Color            Flow Doppler were utilized during procedure). Indications:     CHF-acute diastolic I50.31  History:         Patient has no prior history of Echocardiogram examinations.  Sonographer:     Christopher Furnace Referring Phys:  [id number] INGE JONETTA LECHER Diagnosing Phys: Evalene Lunger MD IMPRESSIONS  1. Left ventricular ejection fraction, by estimation, is 40 to 45%. The left ventricle has mildly decreased function. The left ventricle demonstrates global hypokinesis. There is moderate left ventricular hypertrophy. Left ventricular diastolic parameters are indeterminate.  2. Right ventricular systolic function is moderately reduced. The right ventricular size is moderately enlarged. There is moderately elevated pulmonary artery systolic pressure.  3. Right atrial size was severely dilated.  4. The mitral valve is normal in structure. No evidence of mitral valve regurgitation. No evidence of mitral stenosis. Severe mitral annular calcification.  5. Tricuspid valve regurgitation is mild to moderate.  6. The aortic valve is tricuspid. Aortic valve regurgitation is not visualized. No aortic stenosis is present.  7. Mild pulmonic stenosis.  8. The inferior vena cava is normal in size with greater than 50% respiratory variability, suggesting  right atrial pressure of 3 mmHg. FINDINGS  Left Ventricle: Left ventricular ejection fraction, by estimation, is 40 to 45%. The left ventricle has mildly decreased function. The left ventricle demonstrates global hypokinesis. Strain was performed and the global longitudinal strain is indeterminate. The left ventricular internal cavity size was normal in size. There is moderate left ventricular hypertrophy. Left ventricular diastolic parameters are indeterminate. Right Ventricle: The right ventricular size is moderately enlarged. No increase in right ventricular wall thickness. Right ventricular systolic function is moderately reduced. There is moderately elevated pulmonary artery systolic pressure. The tricuspid  regurgitant velocity is 3.21 m/s, and with an assumed right atrial pressure of 5 mmHg, the estimated right ventricular systolic pressure is 46.2 mmHg. Left Atrium: Left atrial size was normal in size. Right Atrium: Right atrial size was severely dilated. Pericardium: There is no evidence of pericardial effusion. Mitral Valve: The mitral valve is normal in structure. Severe mitral annular calcification. No evidence of mitral valve regurgitation. No evidence of mitral valve stenosis. Tricuspid Valve: The tricuspid valve is normal in structure. Tricuspid valve regurgitation is mild to moderate. No evidence of tricuspid stenosis. Aortic Valve: The aortic valve is tricuspid. Aortic valve regurgitation is not visualized. No aortic stenosis is present. Aortic valve mean gradient measures 3.0 mmHg. Aortic valve peak gradient measures 5.2 mmHg. Aortic valve area, by VTI measures 3.00 cm. Pulmonic Valve: The pulmonic valve was normal in structure. Pulmonic valve regurgitation is mild. Mild pulmonic stenosis. Aorta: The aortic root is normal in size and structure. Venous: The inferior vena cava is normal in size with greater than 50% respiratory variability, suggesting right atrial pressure of 3 mmHg. IAS/Shunts: No  atrial level shunt detected by color flow Doppler. Additional Comments: 3D was performed not requiring image post processing on an independent workstation and was indeterminate.  LEFT VENTRICLE PLAX 2D LVIDd:         5.30 cm   Diastology LVIDs:         3.70 cm   LV e' medial:    4.35 cm/s LV PW:         1.70 cm   LV E/e' medial:  30.8 LV IVS:        1.70 cm   LV e' lateral:   8.05 cm/s LVOT diam:     2.30 cm   LV E/e' lateral: 16.6 LV SV:         59 LV SV Index:   29 LVOT Area:     4.15 cm  LEFT ATRIUM           Index        RIGHT ATRIUM           Index LA diam:      5.10 cm 2.52 cm/m   RA Area:     78.40 cm LA Vol (A4C): 81.6 ml 40.28 ml/m  RA Volume:   489.00 ml 241.37 ml/m  AORTIC VALVE                    PULMONIC VALVE AV Area (Vmax):    2.85 cm     PR End Diast Vel: 9.49 msec AV Area (Vmean):   2.74 cm AV Area (VTI):     3.00 cm AV Vmax:           114.00 cm/s AV Vmean:          75.700 cm/s AV VTI:            0.195 m AV Peak Grad:      5.2 mmHg AV Mean Grad:      3.0 mmHg LVOT Vmax:         78.20 cm/s LVOT Vmean:        50.000 cm/s LVOT VTI:          0.141 m LVOT/AV VTI ratio: 0.72 MITRAL VALVE                TRICUSPID VALVE MV Area (PHT): 2.62 cm     TR Peak grad:   41.2 mmHg MV Decel Time: 290 msec     TR Vmax:        321.00 cm/s MV E velocity: 134.00 cm/s                             SHUNTS                             Systemic VTI:  0.14 m                             Systemic Diam: 2.30 cm Evalene Lunger MD Electronically signed by Evalene Lunger MD Signature Date/Time: 11/19/2024/1:17:58 PM    Final    DG Chest Port 1 View Result Date: 11/18/2024 CLINICAL DATA:  Acute respiratory failure, hypoxia EXAM: PORTABLE CHEST 1 VIEW COMPARISON:  11/18/2024 FINDINGS: Single frontal view of the chest demonstrates a stable enlarged cardiac silhouette. Central pulmonary vascular congestion with patchy perihilar ground-glass airspace disease consistent with edema. No effusion or  pneumothorax. Left chest wall  port unchanged. No acute bony abnormalities. IMPRESSION: 1. Mild congestive heart failure. Electronically Signed   By: Ozell Daring M.D.   On: 11/18/2024 18:50   DG Chest 1 View Result Date: 11/18/2024 EXAM: 1 VIEW(S) XRAY OF THE CHEST 11/18/2024 06:22:00 AM COMPARISON: 11/17/2024 CLINICAL HISTORY: The patient has congestive heart failure (CHF). ICD10: 02706 - Congestive heart failure. FINDINGS: LINES, TUBES AND DEVICES: Stable left chest port. Cardiac monitoring devices noted. LUNGS AND PLEURA: No focal pulmonary opacity. No pleural effusion. No pneumothorax. HEART AND MEDIASTINUM: Stable cardiomegaly. BONES AND SOFT TISSUES: No acute osseous abnormality. IMPRESSION: 1. Stable cardiomegaly. 2. No acute findings. Electronically signed by: Waddell Calk MD 11/18/2024 06:35 AM EST RP Workstation: [id number]   DG Chest Portable 1 View Result Date: 11/17/2024 CLINICAL DATA:  Dyspnea EXAM: PORTABLE CHEST 1 VIEW COMPARISON:  Mar 13, 2024 FINDINGS: Stable cardiomegaly. Left subclavian Port-A-Cath is unchanged. Both lungs are clear. The visualized skeletal structures are unremarkable. IMPRESSION: No active disease. Electronically Signed   By: Lynwood Landy Raddle M.D.   On: 11/17/2024 12:12     ONCOLOGY HISTORY: Patient completed cycle 9 of adjuvant FOLFOX on June 03, 2020.  Given his difficulties with treatment and declining performance status, treatment was discontinued altogether. Colonoscopy on September 03, 2022 did not reveal any significant pathology and recommendation was to repeat in 3 years.    ASSESSMENT: Recurrent colon cancer, smoldering myeloma, prostate cancer.  PLAN:    Recurrent colon cancer: PET scan results from December 30, 2023 reviewed independently with progressive disease of hypermetabolic retrocaval mass, but no other evidence of metastatic disease.  Patient has completed SBRT to this lesion.  PET scan on April 14, 2024 reviewed independently with continued progression of disease now with  evidence of liver metastasis.  Patient subsequently received chemotherapy with FOLFIRI between April 28, 2024 and August 11, 2024.  Repeat PET scan results from August 25, 2024 revealed improvement of hypermetabolism and disease, although likely with still residual malignancy.  After lengthy discussion with the patient, he elected for a chemotherapy holiday and expressed understanding that he will likely need retreatment in the future.  Patient's CEA continues to trend up and his most recent result was 934.  He has a repeat PET scan scheduled for next week.  Follow-up after PET scan to discuss the results and consideration of reinitiating treatment. Prostate cancer: Gleason's 3+4.  Patient's PSA increased and peaked at 39.34, but since completing treatment it continues to trend down.  His most recent PSA on October 15, 2024 was ported 0.76.  Nuclear medicine bone scan on December 13, 2022 was reported as negative.  PSMA PET per radiation oncology from Mar 21, 2023 did not reveal metastatic disease.  Patient completed XRT for local control disease on May 30, 2023.  Can consider Eligard in the future.  Continue follow-up with urology as indicated.   Smoldering myeloma: Chronic and unchanged.  Patient was initially diagnosed and treated in [place], [place]  and treated with single agent Velcade in approximately 2013.  He declined maintenance treatment or referral for bone marrow transplant.  His M spike has ranged from 1.1-2.0 since July 2020.  His most recent result from April 28, 2024 is 1.8.  Over the same timeframe his IgG component has ranged from [phone].  His most recent result is 2635.  Finally, his kappa free light chains have ranged from 24.8 -75.3.  His most recent result was elevated at 98.5, but his lambda free light  chains are also elevated.  His kappa/lambda free light chain ratio was within normal limits.  Nuclear med bone scan as above.  His most recent bone marrow biopsy completed on May 28, 2019 revealed only 10 to 15% plasma cells with no clonality reported.  Cytogenetics were also reported as normal.    Anemia: Hemoglobin improved to 8.9 after multiple units of packed red blood cells in the hospital.  It initially decreased to 5.0 after GI bleed.  He does not require transfusion tomorrow. Neutropenia: Resolved.   Thrombocytopenia: Chronic and unchanged.  Patient platelet count is 106.   Renal insufficiency: Patient's GFR has improved back to his baseline.  His most recent result was 31.  Neither irinotecan  or 5-FU need to be dosed reduced in renal insufficiency.  Continue follow-up with nephrology as scheduled. Hypomagnesia: Resolved. Peripheral neuropathy: Continue gabapentin  and Lyrica  as prescribed.  He previously given a referral to neurology.   Hypotension: Resolved. Cardiac disease: Patient recently underwent mitral valve repair.  He has an appointment with cardiology at [facility] tomorrow and reports he will likely require a second procedure next week.  Patient plans to call clinic with these details. Diarrhea: Resolved.  Continue Imodium as needed. Hemorrhoids: Patient does not complain of this today.  Continue OTC remedies as needed. Urinary frequency: Patient does not complain of this today.  Patient was previously given a referral back to urology.  Patient expressed understanding and was in agreement with this plan. He also understands that He can call clinic at any time with any questions, concerns, or complaints.    Evalene JINNY Reusing, MD   11/24/2024 12:32 PM     "

## 2024-11-24 NOTE — Progress Notes (Signed)
 "    Palliative Medicine [facility] [phone] Fax:([phone]   Name: Chris George Date: 11/24/2024 MRN: [id number]  DOB: 1956-10-02  Patient Care Team: Jeffie Cheryl FORBES, MD as PCP - General (Family Medicine) [name], MD as Consulting Physician (Oncology) [name], MD as Referring Physician (Neurology) Marcelino Gales, MD as Consulting Physician (Nephrology) Jama Margery ORN, MD as Referring Physician (Cardiology) Lenn Aran, MD as Consulting Physician (Radiation Oncology)    REASON FOR CONSULTATION: Chris George is a 68 y.o. male with multiple medical problems including chronic right-sided CHF, PAF on Eliquis , hypotension on midodrine , mitral regurgitation status post repair in December 2025, CKD stage IV, smoldering multiple myeloma, prostate cancer status post XRT, and colon cancer.  Palliative care consulted to address goals. .   SOCIAL HISTORY:     reports that he has never smoked. He has never been exposed to tobacco smoke. He has never used smokeless tobacco. He reports that he does not currently use alcohol. He reports that he does not use drugs.  Patient lives at home alone.  He has a mother and brother who are involved and live nearby.  ADVANCE DIRECTIVES:    CODE STATUS:   PAST MEDICAL HISTORY: Past Medical History:  Diagnosis Date   A-fib (HCC)    Acute kidney injury superimposed on chronic kidney disease 11/19/2023   Acute on chronic heart failure (HCC) 11/19/2023   Acute on chronic heart failure with preserved ejection fraction (HCC) 11/19/2023   Anemia    Arthritis    ankles   Cancer (HCC)    multiple myeloma   Cancer of transverse colon (HCC) 10/26/2019   Cardiomyopathy (HCC)    Chemotherapy-induced peripheral neuropathy    CHF (congestive heart failure) (HCC)    Depression    Dysrhythmia    atrial fib   Elevated troponin 11/19/2023   History of CVA (cerebrovascular  accident)    HLD (hyperlipidemia)    Hypercholesteremia    Hypertension    Moderate tricuspid insufficiency    Multiple myeloma (HCC)    not being treated right now per pt 11/11/19   Pneumonia    Priapism 07/25/2012   Severe mitral regurgitation 09/13/2024   Sleep apnea    No CPAP.  OSA resolved with wt loss.    PAST SURGICAL HISTORY:  Past Surgical History:  Procedure Laterality Date   ATRIAL ABLATION SURGERY     CARDIAC SURGERY     A-Fib Ablations   COLONOSCOPY N/A 03/17/2024   Procedure: COLONOSCOPY;  Surgeon: Jinny Carmine, MD;  Location: [facility] ENDOSCOPY;  Service: Endoscopy;  Laterality: N/A;   COLONOSCOPY  11/18/2024   Procedure: COLONOSCOPY, WITH ARGON PLASMA COAGULATION;  Surgeon: Jinny Carmine, MD;  Location: [facility] ENDOSCOPY;  Service: Endoscopy;;   COLONOSCOPY WITH PROPOFOL  N/A 10/26/2019   Procedure: COLONOSCOPY WITH BIOPSY;  Surgeon: Jinny Carmine, MD;  Location: [facility] SURGERY CNTR;  Service: Endoscopy;  Laterality: N/A;   COLONOSCOPY WITH PROPOFOL  N/A 09/03/2022   Procedure: COLONOSCOPY WITH PROPOFOL ;  Surgeon: Jinny Carmine, MD;  Location: [facility] SURGERY CNTR;  Service: Endoscopy;  Laterality: N/A;   ESOPHAGOGASTRODUODENOSCOPY N/A 03/17/2024   Procedure: GI Bleeding;  Surgeon: Jinny Carmine, MD;  Location: [facility] ENDOSCOPY;  Service: Endoscopy;  Laterality: N/A;  GI Bleeding, EGD   ESOPHAGOGASTRODUODENOSCOPY N/A 11/18/2024   Procedure: EGD (ESOPHAGOGASTRODUODENOSCOPY);  Surgeon: Jinny Carmine, MD;  Location: [facility] ENDOSCOPY;  Service: Endoscopy;  Laterality: N/A;   ESOPHAGOGASTRODUODENOSCOPY (EGD) WITH PROPOFOL  N/A 10/26/2019  Procedure: ESOPHAGOGASTRODUODENOSCOPY (EGD) WITH BIOPSY;  Surgeon: Jinny Carmine, MD;  Location: [facility] SURGERY CNTR;  Service: Endoscopy;  Laterality: N/A;  sleep apnea   GIVENS CAPSULE STUDY  11/18/2024   Procedure: IMAGING PROCEDURE, GI TRACT, INTRALUMINAL, VIA CAPSULE;  Surgeon: Jinny Carmine, MD;  Location: [facility] ENDOSCOPY;  Service: Endoscopy;;    LAPAROSCOPIC PARTIAL COLECTOMY Right 11/17/2019   Procedure: LAPAROSCOPIC PARTIAL COLECTOMY RIGHT EXTENDED;  Surgeon: Desiderio Schanz, MD;  Location: [facility] ORS;  Service: General;  Laterality: Right;   PORTACATH PLACEMENT N/A 12/28/2019   Procedure: INSERTION PORT-A-CATH Left subclavian;  Surgeon: Desiderio Schanz, MD;  Location: [facility] ORS;  Service: General;  Laterality: N/A;    HEMATOLOGY/ONCOLOGY HISTORY:  Oncology History  Cancer of transverse colon (HCC)  10/27/2019 Initial Diagnosis   Cancer of transverse colon (HCC)   12/01/2019 Cancer Staging   Staging form: Colon and Rectum, AJCC 8th Edition - Clinical stage from 12/01/2019: Stage IIIC (cT3, cN2b, cM0) - Signed by [name], MD on 12/01/2019   12/30/2019 - 06/03/2020 Chemotherapy   Patient is on Treatment Plan : COLORECTAL FOLFOX q14d x 6 months     05/15/2023 - 04/30/2024 Chemotherapy   Patient is on Treatment Plan : COLORECTAL FOLFIRI q14d     05/12/2024 -  Chemotherapy   Patient is on Treatment Plan : COLORECTAL FOLFIRI q14d       ALLERGIES:  has no known allergies.  MEDICATIONS:  Current Outpatient Medications  Medication Sig Dispense Refill   allopurinol  (ZYLOPRIM ) 100 MG tablet Take 50 mg by mouth daily.     apixaban  (ELIQUIS ) 5 MG TABS tablet Take 5 mg by mouth 2 (two) times daily.     atorvastatin  (LIPITOR) 80 MG tablet Take 80 mg by mouth daily.     bumetanide  (BUMEX ) 2 MG tablet Take 1.5 mg by mouth daily. Taking one in the morning.     calcitRIOL  (ROCALTROL ) 0.25 MCG capsule Take 0.25 mcg by mouth daily.     colchicine  0.6 MG tablet Take 1 tablet (0.6 mg total) by mouth daily as needed. Until flare resolves.     cyclobenzaprine  (FLEXERIL ) 5 MG tablet take 1 tablet by mouth three times daily as needed for muscle spasm for up to 10 days     diphenoxylate -atropine  (LOMOTIL ) 2.5-0.025 MG tablet Take 1 tablet by mouth 4 (four) times daily as needed for diarrhea or loose stools. 30 tablet 0   gabapentin  (NEURONTIN ) 100  MG capsule Take 2 capsules (200 mg total) by mouth 2 (two) times daily. 60 capsule 1   hydrocortisone  (ANUSOL -HC) 2.5 % rectal cream Place 1 Application rectally 2 (two) times daily. 30 g 0   lidocaine -prilocaine  (EMLA ) cream Apply 1 Application topically daily.     magnesium  chloride (SLOW-MAG) 64 MG TBEC SR tablet Take 1 tablet (64 mg total) by mouth daily. 60 tablet 1   midodrine  (PROAMATINE ) 10 MG tablet Take 10 mg by mouth.     Multiple Vitamin (MULTIVITAMIN ADULT PO) Take 1 tablet by mouth daily.     ondansetron  (ZOFRAN ) 8 MG tablet Take 1 tablet (8 mg total) by mouth every 8 (eight) hours as needed for nausea or vomiting. 20 tablet 0   oxyCODONE -acetaminophen  (PERCOCET/ROXICET) 5-325 MG tablet Take 1-2 tablets by mouth every 6 (six) hours as needed for severe pain (pain score 7-10). 60 tablet 0   potassium chloride  SA (KLOR-CON  M) 20 MEQ tablet Take 20 mEq by mouth daily.     prochlorperazine  (COMPAZINE ) 10 MG tablet Take 1 tablet (10  mg total) by mouth every 6 (six) hours as needed for nausea or vomiting. 30 tablet 0   PSYLLIUM PO Take 1 packet by mouth.     sodium bicarbonate  650 MG tablet Take 1 tablet (650 mg total) by mouth 2 (two) times daily. 120 tablet 1   [Paused] spironolactone (ALDACTONE) 25 MG tablet Take 1 tablet by mouth every morning. (Patient not taking: Reported on 11/24/2024)     tamsulosin  (FLOMAX ) 0.4 MG CAPS capsule Take 1 capsule (0.4 mg total) by mouth 2 (two) times daily. 90 capsule 7   valACYclovir  (VALTREX ) 1000 MG tablet Take 1 tablet (1,000 mg total) by mouth daily. 10 tablet 0   No current facility-administered medications for this visit.   Facility-Administered Medications Ordered in Other Visits  Medication Dose Route Frequency Provider Last Rate Last Admin   heparin  lock flush 100 unit/mL  500 Units Intravenous Once Finnegan, Timothy J, MD        VITAL SIGNS: There were no vitals taken for this visit. There were no vitals filed for this visit.   Estimated body mass index is 28.35 kg/m as calculated from the following:   Height as of an earlier encounter on 11/24/24: 5' 9 (1.753 m).   Weight as of an earlier encounter on 11/24/24: 192 lb (87.1 kg).  LABS: CBC:    Component Value Date/Time   WBC 4.4 11/24/2024 0911   WBC 3.4 (L) 11/20/2024 0345   HGB 8.9 (L) 11/24/2024 0911   HGB 14.3 05/13/2013 1116   HCT 28.5 (L) 11/24/2024 0911   HCT 42.9 05/13/2013 1116   PLT 106 (L) 11/24/2024 0911   PLT 169 05/13/2013 1116   MCV 93.8 11/24/2024 0911   MCV 84 05/13/2013 1116   NEUTROABS 3.2 11/24/2024 0911   NEUTROABS 2.3 05/13/2013 1116   LYMPHSABS 0.4 (L) 11/24/2024 0911   LYMPHSABS 0.9 (L) 05/13/2013 1116   MONOABS 0.6 11/24/2024 0911   MONOABS 0.4 05/13/2013 1116   EOSABS 0.1 11/24/2024 0911   EOSABS 0.1 05/13/2013 1116   BASOSABS 0.0 11/24/2024 0911   BASOSABS 0.0 05/13/2013 1116   Comprehensive Metabolic Panel:    Component Value Date/Time   NA 138 11/24/2024 0912   NA 143 05/13/2013 1116   K 4.1 11/24/2024 0912   K 3.5 05/13/2013 1116   CL 100 11/24/2024 0912   CL 102 05/13/2013 1116   CO2 29 11/24/2024 0912   CO2 32 05/13/2013 1116   BUN 62 (H) 11/24/2024 0912   BUN 14 05/13/2013 1116   CREATININE 2.26 (H) 11/24/2024 0912   CREATININE 1.53 (H) 05/13/2013 1116   GLUCOSE 103 (H) 11/24/2024 0912   GLUCOSE 122 (H) 05/13/2013 1116   CALCIUM  8.9 11/24/2024 0912   CALCIUM  8.6 05/13/2013 1116   AST 34 11/24/2024 0912   ALT 17 11/24/2024 0912   ALT 37 07/24/2012 1915   ALKPHOS 249 (H) 11/24/2024 0912   ALKPHOS 129 07/24/2012 1915   BILITOT 0.6 11/24/2024 0912   PROT 6.4 (L) 11/24/2024 0912   PROT 8.4 (H) 07/24/2012 1915   ALBUMIN 3.1 (L) 11/24/2024 0912   ALBUMIN 4.0 07/24/2012 1915    RADIOGRAPHIC STUDIES: ECHOCARDIOGRAM COMPLETE Result Date: 11/19/2024    ECHOCARDIOGRAM REPORT   Patient Name:   Chris George Date of Exam: 11/19/2024 Medical Rec #:  [id number]      Height:       69.0 in Accession #:     [id number]     Weight:  191.0 lb Date of Birth:  1956/10/01       BSA:          2.026 m Patient Age:    68 years       BP:           104/59 mmHg Patient Gender: M              HR:           41 bpm. Exam Location:  [facility] Procedure: 2D Echo, Cardiac Doppler and Color Doppler (Both Spectral and Color            Flow Doppler were utilized during procedure). Indications:     CHF-acute diastolic I50.31  History:         Patient has no prior history of Echocardiogram examinations.  Sonographer:     Christopher Furnace Referring Phys:  [id number] INGE JONETTA LECHER Diagnosing Phys: Evalene Lunger MD IMPRESSIONS  1. Left ventricular ejection fraction, by estimation, is 40 to 45%. The left ventricle has mildly decreased function. The left ventricle demonstrates global hypokinesis. There is moderate left ventricular hypertrophy. Left ventricular diastolic parameters are indeterminate.  2. Right ventricular systolic function is moderately reduced. The right ventricular size is moderately enlarged. There is moderately elevated pulmonary artery systolic pressure.  3. Right atrial size was severely dilated.  4. The mitral valve is normal in structure. No evidence of mitral valve regurgitation. No evidence of mitral stenosis. Severe mitral annular calcification.  5. Tricuspid valve regurgitation is mild to moderate.  6. The aortic valve is tricuspid. Aortic valve regurgitation is not visualized. No aortic stenosis is present.  7. Mild pulmonic stenosis.  8. The inferior vena cava is normal in size with greater than 50% respiratory variability, suggesting right atrial pressure of 3 mmHg. FINDINGS  Left Ventricle: Left ventricular ejection fraction, by estimation, is 40 to 45%. The left ventricle has mildly decreased function. The left ventricle demonstrates global hypokinesis. Strain was performed and the global longitudinal strain is indeterminate. The left ventricular internal cavity size was normal in size. There is moderate left  ventricular hypertrophy. Left ventricular diastolic parameters are indeterminate. Right Ventricle: The right ventricular size is moderately enlarged. No increase in right ventricular wall thickness. Right ventricular systolic function is moderately reduced. There is moderately elevated pulmonary artery systolic pressure. The tricuspid  regurgitant velocity is 3.21 m/s, and with an assumed right atrial pressure of 5 mmHg, the estimated right ventricular systolic pressure is 46.2 mmHg. Left Atrium: Left atrial size was normal in size. Right Atrium: Right atrial size was severely dilated. Pericardium: There is no evidence of pericardial effusion. Mitral Valve: The mitral valve is normal in structure. Severe mitral annular calcification. No evidence of mitral valve regurgitation. No evidence of mitral valve stenosis. Tricuspid Valve: The tricuspid valve is normal in structure. Tricuspid valve regurgitation is mild to moderate. No evidence of tricuspid stenosis. Aortic Valve: The aortic valve is tricuspid. Aortic valve regurgitation is not visualized. No aortic stenosis is present. Aortic valve mean gradient measures 3.0 mmHg. Aortic valve peak gradient measures 5.2 mmHg. Aortic valve area, by VTI measures 3.00 cm. Pulmonic Valve: The pulmonic valve was normal in structure. Pulmonic valve regurgitation is mild. Mild pulmonic stenosis. Aorta: The aortic root is normal in size and structure. Venous: The inferior vena cava is normal in size with greater than 50% respiratory variability, suggesting right atrial pressure of 3 mmHg. IAS/Shunts: No atrial level shunt detected by color flow Doppler. Additional Comments: 3D was  performed not requiring image post processing on an independent workstation and was indeterminate.  LEFT VENTRICLE PLAX 2D LVIDd:         5.30 cm   Diastology LVIDs:         3.70 cm   LV e' medial:    4.35 cm/s LV PW:         1.70 cm   LV E/e' medial:  30.8 LV IVS:        1.70 cm   LV e' lateral:   8.05  cm/s LVOT diam:     2.30 cm   LV E/e' lateral: 16.6 LV SV:         59 LV SV Index:   29 LVOT Area:     4.15 cm  LEFT ATRIUM           Index        RIGHT ATRIUM           Index LA diam:      5.10 cm 2.52 cm/m   RA Area:     78.40 cm LA Vol (A4C): 81.6 ml 40.28 ml/m  RA Volume:   489.00 ml 241.37 ml/m  AORTIC VALVE                    PULMONIC VALVE AV Area (Vmax):    2.85 cm     PR End Diast Vel: 9.49 msec AV Area (Vmean):   2.74 cm AV Area (VTI):     3.00 cm AV Vmax:           114.00 cm/s AV Vmean:          75.700 cm/s AV VTI:            0.195 m AV Peak Grad:      5.2 mmHg AV Mean Grad:      3.0 mmHg LVOT Vmax:         78.20 cm/s LVOT Vmean:        50.000 cm/s LVOT VTI:          0.141 m LVOT/AV VTI ratio: 0.72 MITRAL VALVE                TRICUSPID VALVE MV Area (PHT): 2.62 cm     TR Peak grad:   41.2 mmHg MV Decel Time: 290 msec     TR Vmax:        321.00 cm/s MV E velocity: 134.00 cm/s                             SHUNTS                             Systemic VTI:  0.14 m                             Systemic Diam: 2.30 cm Evalene Lunger MD Electronically signed by Evalene Lunger MD Signature Date/Time: 11/19/2024/1:17:58 PM    Final    DG Chest Port 1 View Result Date: 11/18/2024 CLINICAL DATA:  Acute respiratory failure, hypoxia EXAM: PORTABLE CHEST 1 VIEW COMPARISON:  11/18/2024 FINDINGS: Single frontal view of the chest demonstrates a stable enlarged cardiac silhouette. Central pulmonary vascular congestion with patchy perihilar ground-glass airspace disease consistent with edema. No effusion or pneumothorax. Left chest wall port unchanged. No acute bony abnormalities. IMPRESSION:  1. Mild congestive heart failure. Electronically Signed   By: Ozell Daring M.D.   On: 11/18/2024 18:50   DG Chest 1 View Result Date: 11/18/2024 EXAM: 1 VIEW(S) XRAY OF THE CHEST 11/18/2024 06:22:00 AM COMPARISON: 11/17/2024 CLINICAL HISTORY: The patient has congestive heart failure (CHF). ICD10: 02706 - Congestive heart  failure. FINDINGS: LINES, TUBES AND DEVICES: Stable left chest port. Cardiac monitoring devices noted. LUNGS AND PLEURA: No focal pulmonary opacity. No pleural effusion. No pneumothorax. HEART AND MEDIASTINUM: Stable cardiomegaly. BONES AND SOFT TISSUES: No acute osseous abnormality. IMPRESSION: 1. Stable cardiomegaly. 2. No acute findings. Electronically signed by: Waddell Calk MD 11/18/2024 06:35 AM EST RP Workstation: [id number]   DG Chest Portable 1 View Result Date: 11/17/2024 CLINICAL DATA:  Dyspnea EXAM: PORTABLE CHEST 1 VIEW COMPARISON:  Mar 13, 2024 FINDINGS: Stable cardiomegaly. Left subclavian Port-A-Cath is unchanged. Both lungs are clear. The visualized skeletal structures are unremarkable. IMPRESSION: No active disease. Electronically Signed   By: Lynwood Landy Raddle M.D.   On: 11/17/2024 12:12    PERFORMANCE STATUS (ECOG) : 1 - Symptomatic but completely ambulatory  Review of Systems Unless otherwise noted, a complete review of systems is negative.  Physical Exam General: NAD Cardiovascular: regular rate and rhythm Pulmonary: clear ant fields Abdomen: soft, nontender, + bowel sounds GU: no suprapubic tenderness Extremities: no edema, no joint deformities Skin: no rashes Neurological: Weakness but otherwise nonfocal  IMPRESSION: Patient was admitted to the hospital with symptomatic anemia and melena.  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  Patient required a short stay in ICU on pressors but ultimately left hospital AMA.  He returns to clinic today to see Dr. Jacobo and me.  Patient is aware that imaging is suggestive of cancer progression.  Plan is to restart chemotherapy.  Symptomatically, patient says he is doing reasonably well since discharging home from the hospital.  He did have a flare of gout but says that that is controlled after taking several days of colchicine .  Denies significant pain.  Patient says that he  is interested in completing ACP documents as was discussed in the hospital.  He is also still considering CODE STATUS and wants to speak more with his family and pastor.  He says that he knows that resuscitation is not like you see on TV and that it would not do much if your insides are not working.  He also cites his sister who died after withdrawal of care.  PLAN: -Continue current scope of treatment - Follow-up telephone visit 1 month   Patient expressed understanding and was in agreement with this plan. He also understands that He can call the clinic at any time with any questions, concerns, or complaints.     Time Total: 20 minutes  Visit consisted of counseling and education dealing with the complex and emotionally intense issues of symptom management and palliative care in the setting of serious and potentially life-threatening illness.Greater than 50%  of this time was spent counseling and coordinating care related to the above assessment and plan.  Signed by: Fonda Mower, PhD, NP-C   "

## 2024-11-24 NOTE — Progress Notes (Signed)
 Mr. Husted had to leave before he could be seen.  There was no clinic visit. No Charge.  Norleen MYRTIS Grumbling, MD, MHS Associate Professor of Medicine [facility]  [employer] [place]

## 2024-11-24 NOTE — Telephone Encounter (Signed)
 1 month s/p M-TEER Quality of Life/Frailty Assessment Collected on 11/24/2024  KCCQ:  1a) 5 1b) 3 1c) 1 2) 5 3) 2 4) 2  5) 5 6) 3 7) 3 8a) 3 8b) 3 8c) 3  Patient/family advised to call our office with questions/concerns.

## 2024-11-24 NOTE — Progress Notes (Signed)
 Patient is having gout in his right knee, which the medication is helping. He feels a little better since being in the hospital.

## 2024-11-25 ENCOUNTER — Telehealth: Payer: Self-pay

## 2024-11-25 ENCOUNTER — Inpatient Hospital Stay

## 2024-11-25 LAB — CEA: CEA: 1422 ng/mL — ABNORMAL HIGH (ref 0.0–4.7)

## 2024-11-25 NOTE — Telephone Encounter (Signed)
 Clinical Social Work Intern contacted patient by phone per referral from [employer], CSW to follow up on emotional support. Patient was driving at the time of the call and asked for a call back the following day. CSW Intern will call patient back on 11/26/24 as requested.  Chris George  Clinical Social Work Intern  [employer]

## 2024-11-26 ENCOUNTER — Inpatient Hospital Stay

## 2024-11-26 ENCOUNTER — Encounter: Payer: Self-pay | Admitting: Oncology

## 2024-11-26 NOTE — Progress Notes (Signed)
 [facility] Clinical Social Work  Progress Note   Chris George is a 69 y.o. year old male contacted by phone. Clinical Social Work was referred by medical provider for assessment of psychosocial needs. Call was interrupted due to patient's other appointment- CSW Intern will call patient back on 1/23 to complete assessment per pt request.  Patient explained to CSW Intern complex medical issues he has experienced since 2013 including three cancer Dx, stroke, and heart disease. Until recently patient describes himself as very active and involved in multiple sports including tennis several times/ week.  Patient describes himself as an avid reader which he says Takes me out of my own problems. Patient reports having an extremely supportive family but notes it is good to have someone to talk to outside of his social circle just to listen.   Interventions: CSW Intern provided brief mental health counseling regarding adjusting to ongoing illness and life transitions. Informed patient of the support team roles and support services at [facility] Provided CSW contact information and encouraged patient to call with any questions or concerns   Follow Up Plan: CSW will follow-up with patient by phone to complete psychosocial assessment. Patient expressed appreciation for today's call.  Patient verbalizes understanding of plan: Yes   Thersia KATHEE Daring Clinical Social Worker [facility]

## 2024-11-27 ENCOUNTER — Telehealth: Payer: Self-pay

## 2024-11-27 NOTE — Telephone Encounter (Signed)
 Clinical Social Work Intern contacted patient by phone per patient request since phone visit on 1/22 was cut short. CSW Intern left VM with direct contact information, and will try calling again next week if pt has not returned call.  Thersia Daring  Clinical Social Work Intern  [facility]

## 2024-12-01 ENCOUNTER — Ambulatory Visit
Admission: RE | Admit: 2024-12-01 | Discharge: 2024-12-01 | Disposition: A | Discharge status: Home / Self Care | Source: Ambulatory Visit | Attending: Oncology | Admitting: Oncology

## 2024-12-01 ENCOUNTER — Inpatient Hospital Stay

## 2024-12-01 DIAGNOSIS — C787 Secondary malignant neoplasm of liver and intrahepatic bile duct: Secondary | ICD-10-CM | POA: Insufficient documentation

## 2024-12-01 DIAGNOSIS — C184 Malignant neoplasm of transverse colon: Secondary | ICD-10-CM

## 2024-12-01 DIAGNOSIS — C9000 Multiple myeloma not having achieved remission: Secondary | ICD-10-CM | POA: Insufficient documentation

## 2024-12-01 LAB — CBC WITH DIFFERENTIAL/PLATELET
Abs Immature Granulocytes: 0.01 10*3/uL (ref 0.00–0.07)
Basophils Absolute: 0 10*3/uL (ref 0.0–0.1)
Basophils Relative: 0 %
Eosinophils Absolute: 0.1 10*3/uL (ref 0.0–0.5)
Eosinophils Relative: 1 %
HCT: 25.1 % — ABNORMAL LOW (ref 39.0–52.0)
Hemoglobin: 7.6 g/dL — ABNORMAL LOW (ref 13.0–17.0)
Immature Granulocytes: 0 %
Lymphocytes Relative: 6 %
Lymphs Abs: 0.3 10*3/uL — ABNORMAL LOW (ref 0.7–4.0)
MCH: 28.1 pg (ref 26.0–34.0)
MCHC: 30.3 g/dL (ref 30.0–36.0)
MCV: 93 fL (ref 80.0–100.0)
Monocytes Absolute: 0.5 10*3/uL (ref 0.1–1.0)
Monocytes Relative: 11 %
Neutro Abs: 3.7 10*3/uL (ref 1.7–7.7)
Neutrophils Relative %: 82 %
Platelets: 77 10*3/uL — ABNORMAL LOW (ref 150–400)
RBC: 2.7 MIL/uL — ABNORMAL LOW (ref 4.22–5.81)
RDW: 15.7 % — ABNORMAL HIGH (ref 11.5–15.5)
WBC: 4.6 10*3/uL (ref 4.0–10.5)
nRBC: 0 % (ref 0.0–0.2)

## 2024-12-01 LAB — CMP (CANCER CENTER ONLY)
ALT: 49 U/L — ABNORMAL HIGH (ref 0–44)
AST: 73 U/L — ABNORMAL HIGH (ref 15–41)
Albumin: 3.1 g/dL — ABNORMAL LOW (ref 3.5–5.0)
Alkaline Phosphatase: 437 U/L — ABNORMAL HIGH (ref 38–126)
Anion gap: 8 (ref 5–15)
BUN: 47 mg/dL — ABNORMAL HIGH (ref 8–23)
CO2: 31 mmol/L (ref 22–32)
Calcium: 8.9 mg/dL (ref 8.9–10.3)
Chloride: 98 mmol/L (ref 98–111)
Creatinine: 2.39 mg/dL — ABNORMAL HIGH (ref 0.61–1.24)
GFR, Estimated: 29 mL/min — ABNORMAL LOW
Glucose, Bld: 100 mg/dL — ABNORMAL HIGH (ref 70–99)
Potassium: 4 mmol/L (ref 3.5–5.1)
Sodium: 137 mmol/L (ref 135–145)
Total Bilirubin: 0.6 mg/dL (ref 0.0–1.2)
Total Protein: 6.2 g/dL — ABNORMAL LOW (ref 6.5–8.1)

## 2024-12-01 LAB — SAMPLE TO BLOOD BANK

## 2024-12-01 LAB — GLUCOSE, CAPILLARY: Glucose-Capillary: 89 mg/dL (ref 70–99)

## 2024-12-01 MED ORDER — FLUDEOXYGLUCOSE F - 18 (FDG) INJECTION
10.5100 | Freq: Once | INTRAVENOUS | Status: AC | PRN
  Administered 2024-12-01: 10.51 via INTRAVENOUS

## 2024-12-01 NOTE — Patient Instructions (Signed)

## 2024-12-02 LAB — CEA: CEA: 1391 ng/mL — ABNORMAL HIGH (ref 0.0–4.7)

## 2024-12-03 NOTE — Progress Notes (Signed)
 Last visit-08/10/24 Next visit-12/02/24 Refill CCM time 10 mins

## 2024-12-04 ENCOUNTER — Inpatient Hospital Stay: Admitting: Oncology

## 2024-12-04 VITALS — BP 98/45 | HR 51 | Resp 18 | Ht 69.0 in | Wt 198.0 lb

## 2024-12-04 DIAGNOSIS — C61 Malignant neoplasm of prostate: Secondary | ICD-10-CM

## 2024-12-04 DIAGNOSIS — C184 Malignant neoplasm of transverse colon: Secondary | ICD-10-CM

## 2024-12-08 ENCOUNTER — Other Ambulatory Visit: Payer: Self-pay | Admitting: Oncology

## 2024-12-08 ENCOUNTER — Inpatient Hospital Stay: Attending: Oncology

## 2024-12-08 ENCOUNTER — Telehealth: Payer: Self-pay | Admitting: Oncology

## 2024-12-08 DIAGNOSIS — C184 Malignant neoplasm of transverse colon: Secondary | ICD-10-CM

## 2024-12-08 DIAGNOSIS — C61 Malignant neoplasm of prostate: Secondary | ICD-10-CM

## 2024-12-08 LAB — CBC WITH DIFFERENTIAL/PLATELET
Abs Immature Granulocytes: 0.01 10*3/uL (ref 0.00–0.07)
Basophils Absolute: 0 10*3/uL (ref 0.0–0.1)
Basophils Relative: 0 %
Eosinophils Absolute: 0.1 10*3/uL (ref 0.0–0.5)
Eosinophils Relative: 2 %
HCT: 20 % — ABNORMAL LOW (ref 39.0–52.0)
Hemoglobin: 6.2 g/dL — CL (ref 13.0–17.0)
Immature Granulocytes: 0 %
Lymphocytes Relative: 12 %
Lymphs Abs: 0.3 10*3/uL — ABNORMAL LOW (ref 0.7–4.0)
MCH: 28.8 pg (ref 26.0–34.0)
MCHC: 31 g/dL (ref 30.0–36.0)
MCV: 93 fL (ref 80.0–100.0)
Monocytes Absolute: 0.4 10*3/uL (ref 0.1–1.0)
Monocytes Relative: 15 %
Neutro Abs: 1.8 10*3/uL (ref 1.7–7.7)
Neutrophils Relative %: 71 %
Platelets: 87 10*3/uL — ABNORMAL LOW (ref 150–400)
RBC: 2.15 MIL/uL — ABNORMAL LOW (ref 4.22–5.81)
RDW: 16 % — ABNORMAL HIGH (ref 11.5–15.5)
WBC: 2.6 10*3/uL — ABNORMAL LOW (ref 4.0–10.5)
nRBC: 0 % (ref 0.0–0.2)

## 2024-12-08 LAB — CMP (CANCER CENTER ONLY)
ALT: 24 U/L (ref 0–44)
AST: 37 U/L (ref 15–41)
Albumin: 2.9 g/dL — ABNORMAL LOW (ref 3.5–5.0)
Alkaline Phosphatase: 356 U/L — ABNORMAL HIGH (ref 38–126)
Anion gap: 10 (ref 5–15)
BUN: 61 mg/dL — ABNORMAL HIGH (ref 8–23)
CO2: 28 mmol/L (ref 22–32)
Calcium: 8.5 mg/dL — ABNORMAL LOW (ref 8.9–10.3)
Chloride: 98 mmol/L (ref 98–111)
Creatinine: 2.79 mg/dL — ABNORMAL HIGH (ref 0.61–1.24)
GFR, Estimated: 24 mL/min — ABNORMAL LOW
Glucose, Bld: 98 mg/dL (ref 70–99)
Potassium: 4 mmol/L (ref 3.5–5.1)
Sodium: 136 mmol/L (ref 135–145)
Total Bilirubin: 0.5 mg/dL (ref 0.0–1.2)
Total Protein: 5.8 g/dL — ABNORMAL LOW (ref 6.5–8.1)

## 2024-12-08 LAB — PREPARE RBC (CROSSMATCH)

## 2024-12-08 NOTE — Telephone Encounter (Signed)
 Pt called to r/s missed lab appt from this morning - pt confirmed new appt time - [facility]

## 2024-12-09 ENCOUNTER — Inpatient Hospital Stay

## 2024-12-09 ENCOUNTER — Ambulatory Visit (INDEPENDENT_AMBULATORY_CARE_PROVIDER_SITE_OTHER): Admitting: Urology

## 2024-12-09 VITALS — BP 109/60 | HR 73 | Ht 69.0 in | Wt 195.0 lb

## 2024-12-09 DIAGNOSIS — C61 Malignant neoplasm of prostate: Secondary | ICD-10-CM

## 2024-12-09 DIAGNOSIS — C184 Malignant neoplasm of transverse colon: Secondary | ICD-10-CM

## 2024-12-09 DIAGNOSIS — R399 Unspecified symptoms and signs involving the genitourinary system: Secondary | ICD-10-CM | POA: Diagnosis not present

## 2024-12-09 LAB — URINALYSIS, COMPLETE
Bilirubin, UA: NEGATIVE
Glucose, UA: NEGATIVE
Ketones, UA: NEGATIVE
Leukocytes,UA: NEGATIVE
Nitrite, UA: NEGATIVE
Protein,UA: NEGATIVE
RBC, UA: NEGATIVE
Specific Gravity, UA: <1.005 — ABNORMAL LOW (ref 1.005–1.030)
Urobilinogen, Ur: 0.2 mg/dL (ref 0.2–1.0)
pH, UA: 6 (ref 5.0–7.5)

## 2024-12-09 LAB — MICROSCOPIC EXAMINATION: RBC, Urine: NONE SEEN /HPF (ref 0–2)

## 2024-12-09 LAB — BLADDER SCAN AMB NON-IMAGING

## 2024-12-09 MED ORDER — FUROSEMIDE 10 MG/ML IJ SOLN
20.0000 mg | Freq: Once | INTRAMUSCULAR | Status: AC
  Administered 2024-12-09: 20 mg via INTRAVENOUS
  Filled 2024-12-09: qty 2

## 2024-12-09 MED ORDER — SODIUM CHLORIDE 0.9% IV SOLUTION
250.0000 mL | INTRAVENOUS | Status: DC
  Administered 2024-12-09: 100 mL via INTRAVENOUS
  Filled 2024-12-09: qty 250

## 2024-12-09 MED ORDER — ACETAMINOPHEN 325 MG PO TABS
650.0000 mg | ORAL_TABLET | Freq: Once | ORAL | Status: AC
  Administered 2024-12-09: 650 mg via ORAL
  Filled 2024-12-09: qty 2

## 2024-12-09 MED ORDER — DIPHENHYDRAMINE HCL 50 MG/ML IJ SOLN: 25.0000 mg | Freq: Once | INTRAMUSCULAR | Status: DC

## 2024-12-09 NOTE — Progress Notes (Signed)
" ° °  12/09/2024 10:44 AM   Chris George 1956/04/12 [id number]  Reason for visit: Follow up prostate cancer, urinary symptoms, nocturia  History: Numerous comorbidities including history of stroke, heart failure, stage III colon cancer, CKD stage IV History of spontaneous left scrotal hematoma during admission at [facility] for heart failure, managed conservatively History of high-volume favorable intermediate risk prostate cancer with initial PSA of 15, radiation completed July 2024, did not receive ADT, PSA has decreased <1 ED, not currently using medications Nocturia 3-6 times overnight, on max dose Flomax , normal cystoscopy December 2025, likely related to lower extremity edema and overnight fluid mobilization   Imaging/labs: PSA December 2025 0.76 CEA January 2026 1391 Urinalysis today benign I personally viewed and interpreted the PET scan from 12/01/2024 with decompressed bladder, no hydronephrosis, metastatic colon cancer to liver and retroperitoneal lymph nodes  Today: From a urinary perspective doing well, nocturia improved to 2 times overnight Recently had mitral valve replaced, lower extremity edema slightly improved but persistent Remains on high-dose diuretics Some urinary urgency during the day, PVR today normal at 41ml  Plan:   Prostate cancer: Treated with radiation, PSA less than 0, continue PSA monitoring every 6 to 12 months with oncology Urinary symptoms/nocturia: Continue Flomax  0.4 mg twice daily, behavioral strategies discussed, multifactorial with high-dose diuretics, lower extremity edema, multiple comorbidities.  Emptying appropriately, recent cystoscopy December 2025 benign RTC 1 year PVR and symptom check   Chris JAYSON Burnet, MD  [facility] [address], [address] [place], [place] [address] [phone]  "

## 2024-12-09 NOTE — Patient Instructions (Signed)
 Nocturia refers to the need to wake up during the night to urinate, which can disrupt your sleep and impact your overall well-being. Fortunately, there are several strategies you can employ to help prevent or manage nocturia. It's important to consult with your healthcare provider before making any significant changes to your routine. Here are some helpful strategies to consider:  Limit Fluid Intake Before Bed: Avoid drinking large amounts of fluids in the evening, especially within a few hours of bedtime. Consume most of your daily fluid intake earlier in the day to reduce the need to urinate at night.  Monitor Your Diet: Limit your intake of caffeine and alcohol, as these substances can increase urine production and irritate the bladder.  Avoid diet, zero calorie, and artificially sweetened drinks, especially sodas, in the afternoon or evening. Be mindful of consuming foods and drinks with high water  content before bedtime, such as watermelon and herbal teas.  Time Your Medications: If you're taking medications that contribute to increased urination, consult your healthcare provider about adjusting the timing of these medications to minimize their impact during the night.  Practice Double Voiding: Before going to bed, make an effort to empty your bladder twice within a short period. This can help reduce the amount of urine left in your bladder before sleep.  Bladder Training: Gradually increase the time between bathroom visits during the day to train your bladder to hold larger volumes of urine. Over time, this can help reduce the frequency of nighttime awakenings to urinate.  Elevate Your Legs During the Day: Elevating your legs during the day can help minimize fluid retention in your lower extremities, which might reduce nighttime urination.  Pelvic Floor Exercises: Strengthening your pelvic floor muscles through Kegel exercises can help improve bladder control and potentially reduce  the urge to urinate at night.  Create a Relaxing Bedtime Routine: Stress and anxiety can exacerbate nocturia. Engage in calming activities before bed, such as reading, listening to soothing music, or practicing relaxation techniques.  Stay Active: Engage in regular physical activity, but avoid intense exercise close to bedtime, as this can increase your body's demand for fluids.  Maintain a Healthy Weight: Excess weight can compress the bladder and contribute to bladder and urinary issues. Aim to achieve and maintain a healthy weight through a balanced diet and regular exercise.  Remember that every individual is unique, and the effectiveness of these strategies may vary. It's important to work with your healthcare provider to develop a plan that suits your specific needs and addresses any underlying causes of nocturia.

## 2024-12-10 ENCOUNTER — Inpatient Hospital Stay

## 2024-12-10 ENCOUNTER — Other Ambulatory Visit: Payer: Self-pay | Admitting: *Deleted

## 2024-12-10 ENCOUNTER — Other Ambulatory Visit: Payer: Self-pay | Admitting: Oncology

## 2024-12-10 LAB — TYPE AND SCREEN
ABO/RH(D): O POS
Antibody Screen: NEGATIVE
Unit division: 0
Unit division: 0

## 2024-12-10 LAB — BPAM RBC
Blood Product Expiration Date: 202602162359
Blood Product Unit Number: 202602112359
ISSUE DATE / TIME: 202602041247
PRODUCT CODE: 202602041450
PRODUCT CODE: 202602162359
Unit Type and Rh: 5100
Unit Type and Rh: 202602112359
Unit Type and Rh: 5100
Unit Type and Rh: 5100

## 2024-12-10 MED ORDER — OXYCODONE-ACETAMINOPHEN 5-325 MG PO TABS: 1.0000 | ORAL_TABLET | Freq: Four times a day (QID) | ORAL | 0 refills | Status: AC | PRN

## 2024-12-10 NOTE — Telephone Encounter (Signed)
 Patient calling has his apts on 2/10 are not scheduled. His d/c pump and inj are scheduled for 2/12. New apts for 2/10 were provided and times reviewed. Pt read back the apt dates/times to me.   Patient also requesting RF of percocet if possible. Script pended- will fwd to Dr. Jacobo for approval. Pt requesting to be sent to [facility] in [place].

## 2024-12-10 NOTE — Progress Notes (Signed)
 [facility] CSW Progress Note  Clinical Social Work Intern contacted patient by phone to follow-up on emotional support.    Interventions: Provided brief mental health counseling with regard to navigating multiple serious health challenges and stage-of-life transitions; touched on themes of changing self- identity, purpose, and values while navigating serious illness.   Provided information about [employer] classes.  Offered individual counseling at [facility]. Patient in agreement.      Follow Up Plan:  CSW will follow-up with patient by phone on 2/6 to schedule counseling appointment once patient confirms infusion schedule.    Thersia KATHEE Daring Clinical Social Work Intern [facility]

## 2024-12-15 ENCOUNTER — Inpatient Hospital Stay: Admitting: Oncology

## 2024-12-15 ENCOUNTER — Inpatient Hospital Stay

## 2024-12-15 DIAGNOSIS — C184 Malignant neoplasm of transverse colon: Secondary | ICD-10-CM

## 2024-12-17 ENCOUNTER — Inpatient Hospital Stay

## 2024-12-25 ENCOUNTER — Inpatient Hospital Stay: Admitting: Hospice and Palliative Medicine

## 2024-12-29 ENCOUNTER — Inpatient Hospital Stay

## 2024-12-29 ENCOUNTER — Inpatient Hospital Stay: Admitting: Oncology

## 2024-12-31 ENCOUNTER — Inpatient Hospital Stay

## 2025-01-12 ENCOUNTER — Inpatient Hospital Stay

## 2025-01-12 ENCOUNTER — Inpatient Hospital Stay: Admitting: Oncology

## 2025-01-14 ENCOUNTER — Inpatient Hospital Stay

## 2025-01-26 ENCOUNTER — Inpatient Hospital Stay

## 2025-01-26 ENCOUNTER — Inpatient Hospital Stay: Admitting: Oncology

## 2025-01-28 ENCOUNTER — Inpatient Hospital Stay

## 2025-02-09 ENCOUNTER — Inpatient Hospital Stay: Admitting: Oncology

## 2025-02-09 ENCOUNTER — Inpatient Hospital Stay

## 2025-02-11 ENCOUNTER — Inpatient Hospital Stay

## 2025-02-23 ENCOUNTER — Inpatient Hospital Stay

## 2025-02-23 ENCOUNTER — Inpatient Hospital Stay: Admitting: Oncology

## 2025-02-25 ENCOUNTER — Inpatient Hospital Stay

## 2025-10-07 ENCOUNTER — Inpatient Hospital Stay

## 2025-10-14 ENCOUNTER — Ambulatory Visit: Admitting: Radiation Oncology

## 2025-12-09 ENCOUNTER — Ambulatory Visit: Admitting: Urology
